# Patient Record
Sex: Female | Born: 1960
Health system: Southern US, Community
[De-identification: ages and names within clinical notes are randomized; demographics above are authoritative.]

## PROBLEM LIST (undated history)

## (undated) ENCOUNTER — Emergency Department (HOSPITAL_COMMUNITY): Admission: EM | Discharge status: Absent without leave | Payer: Self-pay

## (undated) DIAGNOSIS — M1611 Unilateral primary osteoarthritis, right hip: Secondary | ICD-10-CM

## (undated) DIAGNOSIS — K589 Irritable bowel syndrome without diarrhea: Secondary | ICD-10-CM

## (undated) DIAGNOSIS — G20A1 Parkinson's disease without dyskinesia, without mention of fluctuations: Secondary | ICD-10-CM

## (undated) DIAGNOSIS — Z87442 Personal history of urinary calculi: Secondary | ICD-10-CM

## (undated) DIAGNOSIS — R350 Frequency of micturition: Secondary | ICD-10-CM

## (undated) DIAGNOSIS — G20 Parkinson's disease: Secondary | ICD-10-CM

## (undated) DIAGNOSIS — G629 Polyneuropathy, unspecified: Secondary | ICD-10-CM

## (undated) DIAGNOSIS — K219 Gastro-esophageal reflux disease without esophagitis: Secondary | ICD-10-CM

## (undated) DIAGNOSIS — M5136 Other intervertebral disc degeneration, lumbar region: Secondary | ICD-10-CM

## (undated) DIAGNOSIS — F419 Anxiety disorder, unspecified: Secondary | ICD-10-CM

## (undated) DIAGNOSIS — M7750 Other enthesopathy of unspecified foot: Secondary | ICD-10-CM

## (undated) DIAGNOSIS — E7800 Pure hypercholesterolemia, unspecified: Secondary | ICD-10-CM

## (undated) DIAGNOSIS — F209 Schizophrenia, unspecified: Secondary | ICD-10-CM

## (undated) DIAGNOSIS — F32A Depression, unspecified: Secondary | ICD-10-CM

## (undated) DIAGNOSIS — Z9889 Other specified postprocedural states: Secondary | ICD-10-CM

## (undated) DIAGNOSIS — E119 Type 2 diabetes mellitus without complications: Secondary | ICD-10-CM

## (undated) DIAGNOSIS — G8929 Other chronic pain: Secondary | ICD-10-CM

## (undated) DIAGNOSIS — N189 Chronic kidney disease, unspecified: Secondary | ICD-10-CM

## (undated) DIAGNOSIS — F329 Major depressive disorder, single episode, unspecified: Secondary | ICD-10-CM

## (undated) DIAGNOSIS — G709 Myoneural disorder, unspecified: Secondary | ICD-10-CM

## (undated) DIAGNOSIS — M1712 Unilateral primary osteoarthritis, left knee: Secondary | ICD-10-CM

## (undated) DIAGNOSIS — R51 Headache: Secondary | ICD-10-CM

## (undated) DIAGNOSIS — R112 Nausea with vomiting, unspecified: Secondary | ICD-10-CM

## (undated) DIAGNOSIS — J45909 Unspecified asthma, uncomplicated: Secondary | ICD-10-CM

## (undated) DIAGNOSIS — M25372 Other instability, left ankle: Secondary | ICD-10-CM

## (undated) DIAGNOSIS — I10 Essential (primary) hypertension: Secondary | ICD-10-CM

## (undated) HISTORY — DX: Other enthesopathy of unspecified foot and ankle: M77.50

## (undated) HISTORY — DX: Unilateral primary osteoarthritis, left knee: M17.12

## (undated) HISTORY — DX: Essential (primary) hypertension: I10

## (undated) HISTORY — PX: ABDOMINAL HYSTERECTOMY: SHX81

## (undated) HISTORY — DX: Polyneuropathy, unspecified: G62.9

## (undated) HISTORY — DX: Nausea with vomiting, unspecified: R11.2

## (undated) HISTORY — DX: Unilateral primary osteoarthritis, right hip: M16.11

## (undated) HISTORY — DX: Chronic kidney disease, unspecified: N18.9

## (undated) HISTORY — DX: Other instability, left ankle: M25.372

## (undated) HISTORY — DX: Major depressive disorder, single episode, unspecified: F32.9

## (undated) HISTORY — PX: ANKLE SURGERY: SHX546

## (undated) HISTORY — PX: CARDIAC CATHETERIZATION: SHX172

## (undated) HISTORY — DX: Other specified postprocedural states: Z98.890

## (undated) HISTORY — PX: APPENDECTOMY: SHX54

## (undated) HISTORY — DX: Type 2 diabetes mellitus without complications: E11.9

## (undated) HISTORY — DX: Schizophrenia, unspecified: F20.9

## (undated) HISTORY — DX: Other intervertebral disc degeneration, lumbar region: M51.36

## (undated) HISTORY — DX: Headache: R51

## (undated) HISTORY — PX: JOINT REPLACEMENT: SHX530

## (undated) HISTORY — DX: Depression, unspecified: F32.A

## (undated) HISTORY — DX: Irritable bowel syndrome, unspecified: K58.9

---

## 1995-02-09 HISTORY — PX: KNEE ARTHROSCOPY: SHX127

## 2003-01-11 ENCOUNTER — Emergency Department (HOSPITAL_COMMUNITY): Admission: EM | Admit: 2003-01-11 | Discharge: 2003-01-11 | Payer: Self-pay | Admitting: Emergency Medicine

## 2003-09-01 ENCOUNTER — Emergency Department (HOSPITAL_COMMUNITY): Admission: EM | Admit: 2003-09-01 | Discharge: 2003-09-01 | Payer: Self-pay | Admitting: *Deleted

## 2005-05-14 ENCOUNTER — Encounter: Admission: RE | Admit: 2005-05-14 | Discharge: 2005-05-14 | Payer: Self-pay | Admitting: Gastroenterology

## 2005-05-27 ENCOUNTER — Encounter: Admission: RE | Admit: 2005-05-27 | Discharge: 2005-05-27 | Payer: Self-pay | Admitting: Internal Medicine

## 2005-06-14 ENCOUNTER — Encounter: Admission: RE | Admit: 2005-06-14 | Discharge: 2005-06-14 | Payer: Self-pay | Admitting: Gastroenterology

## 2005-06-25 ENCOUNTER — Encounter: Admission: RE | Admit: 2005-06-25 | Discharge: 2005-06-25 | Payer: Self-pay | Admitting: Internal Medicine

## 2006-07-27 ENCOUNTER — Inpatient Hospital Stay (HOSPITAL_COMMUNITY): Admission: EM | Admit: 2006-07-27 | Discharge: 2006-07-30 | Payer: Self-pay | Admitting: Emergency Medicine

## 2006-07-28 ENCOUNTER — Encounter: Payer: Self-pay | Admitting: Interventional Cardiology

## 2006-07-29 ENCOUNTER — Encounter (INDEPENDENT_AMBULATORY_CARE_PROVIDER_SITE_OTHER): Payer: Self-pay | Admitting: Internal Medicine

## 2006-11-10 ENCOUNTER — Ambulatory Visit (HOSPITAL_COMMUNITY): Admission: RE | Admit: 2006-11-10 | Discharge: 2006-11-10 | Payer: Self-pay | Admitting: Orthopedic Surgery

## 2006-11-10 ENCOUNTER — Ambulatory Visit: Payer: Self-pay | Admitting: Vascular Surgery

## 2008-12-12 ENCOUNTER — Encounter
Admission: RE | Admit: 2008-12-12 | Discharge: 2009-01-29 | Payer: Self-pay | Admitting: Physical Medicine & Rehabilitation

## 2008-12-13 ENCOUNTER — Ambulatory Visit: Payer: Self-pay | Admitting: Physical Medicine & Rehabilitation

## 2009-01-13 ENCOUNTER — Ambulatory Visit: Payer: Self-pay | Admitting: Physical Medicine & Rehabilitation

## 2009-02-10 ENCOUNTER — Encounter
Admission: RE | Admit: 2009-02-10 | Discharge: 2009-05-11 | Payer: Self-pay | Admitting: Physical Medicine & Rehabilitation

## 2009-02-11 ENCOUNTER — Ambulatory Visit: Payer: Self-pay | Admitting: Physical Medicine & Rehabilitation

## 2009-04-08 ENCOUNTER — Ambulatory Visit: Payer: Self-pay | Admitting: Physical Medicine & Rehabilitation

## 2009-05-08 ENCOUNTER — Encounter
Admission: RE | Admit: 2009-05-08 | Discharge: 2009-08-06 | Payer: Self-pay | Admitting: Physical Medicine & Rehabilitation

## 2009-05-12 ENCOUNTER — Ambulatory Visit: Payer: Self-pay | Admitting: Physical Medicine & Rehabilitation

## 2009-06-12 ENCOUNTER — Ambulatory Visit: Payer: Self-pay | Admitting: Physical Medicine & Rehabilitation

## 2009-07-05 ENCOUNTER — Emergency Department (HOSPITAL_COMMUNITY): Admission: EM | Admit: 2009-07-05 | Discharge: 2009-07-06 | Payer: Self-pay | Admitting: Emergency Medicine

## 2009-07-11 ENCOUNTER — Ambulatory Visit: Payer: Self-pay | Admitting: Physical Medicine & Rehabilitation

## 2009-08-13 ENCOUNTER — Encounter (INDEPENDENT_AMBULATORY_CARE_PROVIDER_SITE_OTHER): Payer: Self-pay | Admitting: *Deleted

## 2009-08-18 ENCOUNTER — Encounter
Admission: RE | Admit: 2009-08-18 | Discharge: 2009-10-21 | Payer: Self-pay | Admitting: Physical Medicine & Rehabilitation

## 2009-08-26 ENCOUNTER — Ambulatory Visit: Payer: Self-pay | Admitting: Physical Medicine & Rehabilitation

## 2009-09-04 ENCOUNTER — Ambulatory Visit: Payer: Self-pay | Admitting: Sports Medicine

## 2009-09-05 ENCOUNTER — Telehealth: Payer: Self-pay | Admitting: *Deleted

## 2009-09-08 ENCOUNTER — Ambulatory Visit: Payer: Self-pay | Admitting: Family Medicine

## 2009-09-22 ENCOUNTER — Ambulatory Visit: Payer: Self-pay | Admitting: Sports Medicine

## 2009-10-21 ENCOUNTER — Ambulatory Visit: Payer: Self-pay | Admitting: Physical Medicine & Rehabilitation

## 2009-10-27 ENCOUNTER — Ambulatory Visit: Payer: Self-pay | Admitting: Sports Medicine

## 2009-10-28 ENCOUNTER — Ambulatory Visit: Payer: Self-pay | Admitting: Family Medicine

## 2009-12-15 ENCOUNTER — Ambulatory Visit: Payer: Self-pay | Admitting: Sports Medicine

## 2009-12-15 ENCOUNTER — Encounter (INDEPENDENT_AMBULATORY_CARE_PROVIDER_SITE_OTHER): Payer: Self-pay | Admitting: *Deleted

## 2009-12-15 DIAGNOSIS — T887XXA Unspecified adverse effect of drug or medicament, initial encounter: Secondary | ICD-10-CM | POA: Insufficient documentation

## 2009-12-15 DIAGNOSIS — M7750 Other enthesopathy of unspecified foot: Secondary | ICD-10-CM

## 2009-12-15 HISTORY — DX: Other enthesopathy of unspecified foot and ankle: M77.50

## 2009-12-24 ENCOUNTER — Encounter: Payer: Self-pay | Admitting: Sports Medicine

## 2010-01-26 ENCOUNTER — Ambulatory Visit: Payer: Self-pay | Admitting: Sports Medicine

## 2010-02-26 ENCOUNTER — Encounter: Payer: Self-pay | Admitting: Sports Medicine

## 2010-03-01 ENCOUNTER — Encounter: Payer: Self-pay | Admitting: Internal Medicine

## 2010-03-10 NOTE — Letter (Signed)
Summary: Generic Letter  [facility]  [address]   [place], [place] [address]   Phone: [phone]  Fax: [phone]    12/15/2009  Heather Haley [address] [place], [place]  [address]   The above patient is referred back to her primary physician for management of medications.           Sincerely,   Amy Jake Shark RN

## 2010-03-10 NOTE — Assessment & Plan Note (Signed)
Summary: 1:30 APPT,ARCH BAND PAIN,MC   Vital Signs:  Patient profile:   50 year old female Pulse rate:   96 / minute BP sitting:   138 / 91  (right arm)  Vitals Entered By: Terese Door (September 08, 2009 1:33 PM) CC: left arch band pain   CC:  left arch band pain.  History of Present Illness: f/u left foot pain--arch band has made it no better--infact pain seems worse. Pain is mostly lateral below malleolous, aching. Unchanged since last visit.  In review (from last visit) DOI: 04/17/2006 - was vacuuming, backing up missed a step & twisted ankle with inversion injury.  Was able to walk with significant limp after injury.  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 Phineas Real - [facility]) Following initial surgery continued to have pain, underwent second surgery 06/2007 with repair of peroneal tendons.    Patient notes pain in medial & lateral aspects of L ankle.  Pain is constant, worse with walking & activity.  Pain usually 3/10, increasing to 5/10. Associated ankle & foot swelling. Also has decreased ROM.   Some tingling in foot & posterior calf, along with muscle spasms. Last evaluated by Dr. Phineas Real 3-4 months ago & placed in rigid, metal ankle stablizer built into shoe. Ankle very unstable without brace. Was referred to [place] by Dr. Wynn Banker with [facility], has been seeing him for 51-months. Pain somewhat controlled with regimen of Tramadol, Lidoderm Patch. Was previously on Robaxin for muscle spasms. Can only stand on feet 15-20 mins before ankle starts to bother her.  Previously able to be on feet 8-12 hrs/day without problems.    Allergies: 1)  ! Pcn 2)  ! Codeine 3)  ! Nsaids  Past History:  Past Surgical History: Last updated: 09/04/2009 12/2006 - L ankle surgery 06/2007 - L ankle surgery/repair of peroneal  tendons L knee arthroscopy hysterectomy  Review of Systems  The patient denies fever.    Physical Exam  General:  alert, well-developed, well-nourished, well-hydrated, and overweight-appearing.   Msk:  Left ankle / foot: TTP beklow malleolus laterally and medially around naviular bone but is diffuse here,   Additional Exam:   [place]: I reviewed her [place] pictures from lastvisit showing fluid in teh peroneal tendon sheath  Patient given informed consent for injection. Discussed possible complications of infection, bleeding or skin atrophy at site of injection. Possible side effect of avascular necrosis (focal area of bone death) due to steroid use.Appropriate verbal time out taken Are cleaned and prepped in usual sterile fashion. A -1/2--- cc kennalog 40 plus --1/2--cc 1% lidocaine without epinephrine was injected into the-area around the peroneal tendon sheath--. Patient tolerated procedure well with no complications.    Foot/Ankle Exam  Ankle Exam:    Left:    Inspection/Palpation:  approx 7 degrees inversion and eversion. Anterior drawer is stable (mildly painful) TTP over peroneal tendons, no subluxation is noted. soft touch sensation intact skin intact with     Range of Motion:       Dorsiflex-Active: 10       Plantar Flex-Active: 25   Impression & Recommendations:  Problem # 1:  ANKLE PAIN, LEFT (ICD-719.47)  Orders: Joint Aspirate / Injection, Small (10626) Kenalog 10 mg inj (R4854) very complicated past surgical history. In apporpriate brace, does not want o consider any new surgical intervention  at this time. We discussed options and will try single injection around peroneal tendon sheath to see if we can speed healing, decrease her inflammation. rtc 2-4 w

## 2010-03-10 NOTE — Assessment & Plan Note (Signed)
Summary: NP,WC,L ANKLE INJURY,MC   Vital Signs:  Patient profile:   50 year old female Height:      65 inches Weight:      246 pounds BMI:     41.08 BP sitting:   142 / 86  (left arm) Cuff size:   regular  Vitals Entered By: Tessie Fass CMA (September 04, 2009 8:38 AM) CC: F/U left ankle injury Pain Assessment Patient in pain? yes     Location: left ankle Intensity: 3   CC:  F/U left ankle injury.  History of Present Illness: 50yo female to office for L ankle pain. DOI: 04/17/2006 - was vacuuming, backing up missed a step & twisted ankle with inversion injury.  Was able to walk with significant limp after injury.  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 Phineas Real - [facility]) Following initial surgery continued to have pain, underwent second surgery 06/2007 with repair of peroneal tendons.    Patient notes pain in medial & lateral aspects of L ankle.  Pain is constant, worse with walking & activity.  Pain usually 3/10, increasing to 5/10. Associated ankle & foot swelling. Also has decreased ROM.   Some tingling in foot & posterior calf, along with muscle spasms. Last evaluated by Dr. Phineas Real 3-4 months ago & placed in rigid, metal ankle stablizer built into shoe. Ankle very unstable without brace. Was referred to [place] by Dr. Wynn Banker with [facility], has been seeing him for 82-months. Pain somewhat controlled with regimen of Tramadol, Lidoderm Patch. Was previously on Robaxin for muscle spasms. Can only stand on feet 15-20 mins before ankle starts to bother her.  Previously able to be on feet 8-12 hrs/day without problems.  PMH: HTN, GERD, depression PSH: ankle, hysterectomy, L knee arthroscopy All: PCN - upset stomach, codeine- hives, NSAIDs - cause sweating, palpitations  MEDS: Amolodipine 5mg  once daily Omeprazole 20  once daily Tramadol 100mg  three times a day Ambien 10mg  1/2 tab q hs Lidoderm Patch 2 patches daily Citalopram 10mg  30mg  once daily x 1wk, then 20mg  once daily x 1wk, then 10mg  once daily x 1 wk, then stop (started wean 3-days ago) Cymbalta 20mg  1 tab once daily x 14 days, then 2 tabs once daily (started 3-days ago)  Social: non-smoker, no EtOH, no drugs.  Currently on Workers Comp thru [employer]. Fam hx: CHF, colon CA  Allergies (verified): 1)  ! Pcn 2)  ! Codeine 3)  ! 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 [employer]  Review of Systems General:  Denies chills, fatigue, and fever. CV:  Denies swelling of feet and swelling of hands. MS:  Complains of joint pain, joint swelling, loss of strength, muscle aches, cramps, muscle weakness, and stiffness; denies joint redness, low back pain, and mid back pain. Derm:  Denies changes in color of skin, lesion(s), and rash. Neuro:  Complains of numbness, tingling, and weakness; denies tremors. Psych:  Complains of depression. Heme:  Denies abnormal bruising and bleeding.  Physical Exam  General:  alert, well-developed, and overweight-appearing.  NAD  Head:  normocephalic and  atraumatic.   Eyes:  vision grossly intact, pupils equal, and pupils round.   Neck:  full ROM.   Lungs:  normal respiratory effort.   Msk:  ANKLES: - L ankle: visible surgical scar posterior to lateral malleoli, appears well healed.  Mild swelling  noted posterior to lateral malleoli in area of peroneal tendons.  Decreased ROM - dorsiflexion 10 degrees, plantarflexion 20 degrees, inversion 5 degrees, eversion 5 degrees.  (+)TTP over peroneal tendons, 3rd/4th metatarsals, &  insertion of plantar fascia.  (+)pain with Kleiger test, no palpable subluxation of peroneal tendons.  No laxity with anterior drawer or Talar tilt.  Strength +4/5 in all planes.  Unable to perform toe raises.  Calf with no tenderness, Thompson test does produce ankle plantar flexion.  - R ankle: no gross deformity, swelling, tenderness.  Normal ROM & strength.  Able to perform toe raises without difficulty.  No tenderness at insertion of plantar fascia.  FEET: b/l cavus feet.  HIPS: weakness noted in hip adductors & abductors b/l.  Normal ROM without pain.  GAIT: walks with trendelberg gait & favors L leg Pulses:  +2/4 LE b/l Extremities:  no edema Neurologic:  alert & oriented X3 and sensation intact to light touch.   Additional Exam:  MSK U/S: L ankle - peroneal tendons with surrounding fluid consistant with chronic swelling/strain.  Peroneal tendon measured 0.46 (nl= 0.26).  Anatomy & location of peroneal muscles/tendons slightly distorted due to ankle surgery.  Small calcaneal spur noted, but achilles appears normal.  PF is thickened with measurement of 0.55.  Ankle mortise with normal appear talar dome, no ankle effusion.  Images saved.    Impression & Recommendations:  Problem # 1:  ANKLE PAIN, LEFT (ICD-719.47)  - Chronic ankle pain s/p injury 03/2006, s/p surgery x 2 with repair of peroneal tear - Ankle u/s today revealed fluid around peroneal tendons c/w chronic swelling from chronic strain.  Also suspect some chronic nerve irritation to Sural Nerve. - Pt is currently on a good medication regimen.  Hope cymbalta will help with her symptoms. - Cont. to wear rigid metal ankle brace for support. - Given arch straps in office today to help better support feet. - Cont. ankle flexibility exercises & cont. to use exercise bands. - f/u 4-6 weeks for re-evaluation  Orders: Foot Orthosis ( Arch Strap/Heel Cup) [phone]) [place] LIMITED [phone])  Problem # 2:  FOOT PAIN, LEFT (ICD-729.5)  -  Likely related to above ankle problems, but thickened PF noted on U/S with possible plantar fascitis - Give arch straps to wear in shoes - Reviewed stretching exercises to help this, along with other exercises.  Orders: Foot Orthosis ( Arch Strap/Heel Cup) [phone]) [place] LIMITED [phone])  Patient Instructions: 1)  You appear to have some chronic irritation of the tendons in the area of your surgery.  These are the peroneal muscles. 2)  Wear arch straps for additional support. 3)  Continue to use rigid brace you are already using. 4)  You are on a good medication regimen at this time, we hope Cymbalta will help to lessen your symptoms. 5)  Continue to do ankle flexibility exercises & use your exercise bands. 6)  Do hip exercises as shown in office to help with your hip muscle strength. 7)  follow-up with [place] in 4-6 weeks.

## 2010-03-10 NOTE — Assessment & Plan Note (Signed)
Summary: W/C,F/U LEFT ANKLE,MC   Vital Signs:  Patient profile:   50 year old female Pulse rate:   86 / minute BP sitting:   137 / 93  (right arm)  Vitals Entered By: Rochele Pages RN (December 15, 2009 11:16 AM) CC: f/u left ankle 10% improved   CC:  f/u left ankle 10% improved.  History of Present Illness: Patient reports to clinic to f/u on lt ankle pain which she reports is approximately 10% improved.  Pain is the same with activity as it was previously.  She reports 2/10 pain at rest, which is an improvement. New meds for her are cymbalta, lyrica, and NTG patch.  She is currently doing foot exercises with the theraband and wearing an AFO when out of the house.  However, she does not use any support while at home.    Additionaly, over the past 2 months she has been having some symptoms that she does not relate to anything specifically but does coiencide with her Cymbalta dose being increased from 40 to 60mg .  The patient brought in a list today of symptoms she has been having that included: 1. Losing her hair 2. Headaches + Sharp pains on the left temporal region of her head 3. Blurred Vision with associated headaches to the point where is is unable to drive. 4. Tremors in her hands and legs 5. Eye pressure and pain 6. Xerostomia 7. Sleep disturbances with new onset nightmares 8. Mood changes including labile moods and sucidality although she relates she would NEVER put her kinds through that.  Of note she does have a family hx of suicide (Father) 43. Weight gain 10. Swelling of her gums 11. Mid Back pain 12. Increased Left Leg swelling   Allergies: 1)  ! Pcn 2)  ! Codeine 3)  ! Nsaids  Physical Exam  General:  Pt alert and appropriately interactive.  Pt appears cushingnoid with thinning hair.  She appears in no acute respiratory distress. Neck:  No deformities or tenderness noted.  No thyroid nodules.  Possible mild thyromegaly. Msk:  L Ankle mild swelling posterior to  lateral malleolus with nitro patch in place.  No echymosis.  Surgical changes are seen, with scaring laterally.  Wears an AFO.  Full Inversion; Limited plantar- and dorsi-flexion and eversion.    Mild tenderness with Talar tilt test, firm endpoint.   Neg metatarsal squeeze.  No tenderness over talar dome with plantar flexion.   Neg Ant Drawer.   MSK [place]:  Split Peroneus Brevis.  This is visualized below malleolus;  there is a fusiform swollen area under malleolus that normalizes as tendon progresses distally to insert onto base of 5th MT mod fluid at this level no inc in doppler flow though Pulses:  R & L Dorsalis Pedis and Posterior Tib 2+/4 Extremities:  No clubbing or cyanosis.    Impression & Recommendations:  Problem # 1:  ANKLE PAIN, LEFT (ICD-719.47)  Orders: Airsport brace ([id number]) [place] LIMITED ([id number])   cont on NTG patch as this may have helped some w direct pain over this area  see me again in 6 wks  Problem # 2:  OTHER ENTHESOPATHY OF ANKLE AND TARSUS (ICD-726.79)  On today's scan the peroneal tendons look to have a split in peroneus brevis with interposition of peroneus longus into this tear needs more consistent bracing than ASO or AFO and given brace today ice at end of day  Orders: [place] LIMITED ([id number])  Problem # 3:  ADVERSE  DRUG REACTION (ICD-995.20) I am concerned with periorbital swelling more puffiness noted in ankles and legs bilat  note  myriad of sxs that have developed  cymbalta seems to be most likely cause but maybe the combo of this + lyrica  I want her to see Dr Tanya Nones for repeat evaluation and perhaps weaning cymbalta  Complete Medication List: 1)  Amlodipine Besylate 5 Mg Tabs (Amlodipine besylate) .Marland Kitchen.. 1 by mouth qd 2)  Tramadol Hcl 50 Mg Tabs (Tramadol hcl) .... 2 by mouth three times a day as directed 3)  Zolpidem Tartrate 10 Mg Tabs (Zolpidem tartrate) .... 1/2 tab qhs 4)  Cymbalta 20 Mg Cpep (Duloxetine hcl) .... 2 by mouth qd 5)   Nitroglycerin 0.2 Mg/hr Pt24 (Nitroglycerin) .... 1/4 patch daily to left ankle as directed 6)  Lyrica 75 Mg Caps (Pregabalin) .Marland Kitchen.. 1 by mouth at bedtime 7)  Prednisone 20 Mg Tabs (Prednisone) .... 2 tabs by mouth x 5 days  Patient Instructions: 1)  Please follow up with your PCP to address changing your Cymbalta and possible your Lyrica also.  We suspect that Cymbalta is what is causing the majority of your symptoms however would like your PCP to workup your symptoms further.  We are most suspicious of a thyroid problem.     Orders Added: 1)  Airsport brace [L1906] 2)  Est. Patient Level IV [11914] 3)  [place] LIMITED [[id number]]

## 2010-03-10 NOTE — Letter (Signed)
Summary: *Referral Letter  [facility]  [address]   [place], [place] [address]   Phone: [phone]  Fax: [phone]    12/15/2009 Gilmore Laroche, MD [facility]  Dear [name]:  Please reevaluate your patient:  Heather Haley [address] [place], [place]  [address]  Phone: [phone]  Reason for Referral: I am concerned that she is having a significant adverse reaction to medications - possibly cymbalta and/or lyrica.  Another possibility might be hypothyroidism as she has new periorbital edema.  Today I was able to demonstrate a split in her peroneus brevis tendon on left and this is probably why that ankle  has never healed completely.  I will give her more rigid splinting and keep using NTG to see if this might promote tissue healing.  Procedures Requested:   Current Medical Problems: 1)  ADVERSE DRUG REACTION (ICD-995.20) 2)  OTHER ENTHESOPATHY OF ANKLE AND TARSUS (ICD-726.79) 3)  FOOT PAIN, LEFT (ICD-729.5) 4)  ANKLE PAIN, LEFT (ICD-719.47)   Current Medications: 1)  AMLODIPINE BESYLATE 5 MG TABS (AMLODIPINE BESYLATE) 1 by mouth qd 2)  TRAMADOL HCL 50 MG TABS (TRAMADOL HCL) 2 by mouth three times a day as directed 3)  ZOLPIDEM TARTRATE 10 MG TABS (ZOLPIDEM TARTRATE) 1/2 tab qhs 4)  CYMBALTA 20 MG CPEP (DULOXETINE HCL) 2 by mouth qd 5)  NITROGLYCERIN 0.2 MG/HR PT24 (NITROGLYCERIN) 1/4 patch daily to left ankle as directed 6)  LYRICA 75 MG CAPS (PREGABALIN) 1 by mouth at bedtime 7)  PREDNISONE 20 MG TABS (PREDNISONE) 2 tabs by mouth x 5 days - completed  Past Medical History: 1)  HTN 2)  GERD 3)  Depression  Thank you again for agreeing to see our patient; please contact us if you have any further questions or need additional information.  Sincerely,  Vincent Gros MD

## 2010-03-10 NOTE — Letter (Signed)
Summary: [employer]  [employer]   Imported By: Marily Memos 12/25/2009 13:26:58  _____________________________________________________________________  External Attachment:    Type:   Image     Comment:   External Document

## 2010-03-10 NOTE — Assessment & Plan Note (Signed)
Summary: F/U,MC   Vital Signs:  Patient profile:   50 year old female BP sitting:   149 / 92  Vitals Entered By: Lillia Pauls CMA (September 22, 2009 10:00 AM)  History of Present Illness: PCP is DR Tanya Nones  Referrd to me by Dr Wynn Banker  Still not much relief of pain sxs few days of relief p injeciton wore off P about 3 days  meds - no real change w lidoderm now trying cymbalta and up to 40 w no real change  In past tried gapapentin - too many side effects  has good rigid ankle foot brace  does have pp soft orthotics    Allergies: 1)  ! Pcn 2)  ! Codeine 3)  ! Nsaids  Physical Exam  General:  Well-developed,well-nourished,in no acute distress; alert,appropriate and cooperative throughout examination Msk:  left ankle shows persistent swelling behin lat malleolus this is an area of surgical scar no real change from earlier exam   Impression & Recommendations:  Problem # 1:  ANKLE PAIN, LEFT (ICD-719.47) with persistent swelling over peroneal tendons I think a trial of NTG patches is reasonable seh has had 2 surgeries without relief would like to avoid more surgery not much relief w lidoderm either and maybe we can wean  see inst  Problem # 2:  FOOT PAIN, LEFT (ICD-729.5) would cont to use soft orthotic and rigid brace this stops too much ankle wobble and stress to base of 5th MT where she has most of her pain  Complete Medication List: 1)  Amlodipine Besylate 5 Mg Tabs (Amlodipine besylate) .Marland Kitchen.. 1 by mouth qd 2)  Tramadol Hcl 50 Mg Tabs (Tramadol hcl) .... 2 by mouth three times a day as directed 3)  Zolpidem Tartrate 10 Mg Tabs (Zolpidem tartrate) .... 1/2 tab qhs 4)  Cymbalta 20 Mg Cpep (Duloxetine hcl) .... 2 by mouth qd 5)  Nitroglycerin 0.2 Mg/hr Pt24 (Nitroglycerin) .... 1/4 patch daily to left ankle as directed  Patient Instructions: 1)  use 1/4 patch of NTG daily around lateral left ankle 2)  do not cover this with lidoderm 3)  use lidoderm every  other day and see if it makes difference and if not stop 4)  don't change other meds 5)  I want to recheck this in 1 month after starting patches Prescriptions: NITROGLYCERIN 0.2 MG/HR PT24 (NITROGLYCERIN) 1/4 patch daily to left ankle as directed  #1 box x 2   Entered and Authorized by:   Enid Baas MD   Signed by:   Enid Baas MD on 09/22/2009   Method used:   Print then Give to Patient   RxID:   [id number]

## 2010-03-10 NOTE — Assessment & Plan Note (Signed)
Summary: F/U,MC   Vital Signs:  Patient profile:   50 year old female BP sitting:   138 / 84  Vitals Entered By: Lillia Pauls CMA (October 27, 2009 10:37 AM)  History of Present Illness: L ankle pain follow up  PainNot improved with the Nitroglycerine Patch.     - does get a warm feeilng laterally and extending up leg, but no pain relief now does have some improved sensation along left lat leg   - pain worse as the day goes on    - no inuries or trauma recently   - Doing theraband exercises approx 3 days a week.   wants to discuss Medications:  Taking cymbalta daily, Tramadol, nitro patch.  Tried Lidocain patch in the past (no relief).   Allergies: 1)  ! Pcn 2)  ! Codeine 3)  ! Nsaids  Physical Exam  General:  Well-developed,well-nourished,in no acute distress; alert,appropriate and cooperative throughout examination.  Obese Msk:  L ankle with no swelling or echymosis.  surgical changes are seen, with scaring laterally.  Wears a rigid metal brace, preventing inversion / eversion.   Limited dorsiflexion / plantar flexion ROM.   Limited Eversion of foot.  Inversion full.   Mild tenderness with Talar tilt test, firm endpoint.   Neg metatarsal squeeze.  No tenderness over talar dome with plantar flexion.   Reverse Talar tilt Non-tender.   Neg Ant Drawer.    Pulses:  intact pulses in DP of L foot.    Foot/Ankle Exam  Vascular:    dorsalis pedis and posterior tibial pulses 2+ and symmetric, capillary refill < 2 seconds, normal hair pattern, no evidence of ischemia.    Impression & Recommendations:  Problem # 1:  ANKLE PAIN, LEFT (ICD-719.47) Assessment Unchanged 50 yo f here with Chronic L ankle pain, dating back to inversion injury in 2008, s/p surg x 2 to repair peroneal tendon's.  Currently using Cymbalta, Tramadol 100mg  three times a day.   Since last visit (3 weeks) has been using Nitro patch with no resolution of her pain, but has had some increased feeling of  warmth and return of sensation laterally.    - At this point, feel that she is on a good medical regimen to optimize healing.  Will continue and suggest return in 2 months for follow up.    - continue ROM and theraband exercise ck [place] in 2 mos   - patient interested in decreasing Tramadol dose, informed her to take only as needed, not scheduled.    - RTC to me for any instances of Intense pain, could try a therapeutic Injection as a work in.   Complete Medication List: 1)  Amlodipine Besylate 5 Mg Tabs (Amlodipine besylate) .Marland Kitchen.. 1 by mouth qd 2)  Tramadol Hcl 50 Mg Tabs (Tramadol hcl) .... 2 by mouth three times a day as directed 3)  Zolpidem Tartrate 10 Mg Tabs (Zolpidem tartrate) .... 1/2 tab qhs 4)  Cymbalta 20 Mg Cpep (Duloxetine hcl) .... 2 by mouth qd 5)  Nitroglycerin 0.2 Mg/hr Pt24 (Nitroglycerin) .... 1/4 patch daily to left ankle as directed

## 2010-03-10 NOTE — Letter (Signed)
Summary: Out of Work  [facility]  [address]   [place], [place] [address]   Phone: [phone]  Fax: [phone]    August 13, 2009   Employee:  Briscoe Burns    To Whom It May Concern:   For Medical reasons, please excuse the above named employee from work for the following dates:  Start:  August 13, 2009 9:03 AM   End:  August 13, 2009 9:03 AM   If you need additional information, please feel free to contact our office.         Sincerely,    Lillia Pauls CMA

## 2010-03-10 NOTE — Progress Notes (Signed)
----   Converted from flag ---- ---- 09/05/2009 10:48 AM, Marily Memos wrote: Pt states that the arch band that she got yesterday is painful, its helping a little bit she wants to know if she should continue to use it.  Pt contact # [phone]. ------------------------------  advised pt that she should use the arch band as much as possible but if she is still in pain and it is not any better by monday to call and schedule a f/u appt.

## 2010-03-10 NOTE — Assessment & Plan Note (Signed)
Summary: 1:45appt,ankle/foot pain,mc   Vital Signs:  Patient profile:   50 year old female Pulse rate:   103 / minute BP sitting:   157 / 97  (right arm)  Vitals Entered By: Lillia Pauls CMA (October 28, 2009 1:38 PM) CC: lt foot and ankle pain since yesterday   History of Present Illness: 50 yo f here with Chronic L ankle pain, dating back to inversion injury in 2008, s/p surg x 2 to repair peroneal tendon's.  Currently using Cymbalta, Tramadol 100mg  three times a day.   Since last visit (3 weeks) has been using Nitro patch with no resolution of her pain  Patient here for follow up from visit with [facility] yesterday.  See note from yesterday for full details and history.   L ankle - having an acute amount of wor[place] pain today,  started after the exam yesterday with Dr Darrick Penna.  - swelling and pain all round lateral ankle, extending accross forefoot into medial malleolous.   - some sharp shooting pains on lateral foot as well  - has take 6 Tramadol today, and 4 tylenol, with no relief  - unable to sleep last night due to pain.     - It hurts so bad, she Wishes" that her foot could be cut off and have a broom handle in its place"    - Has tried Voltaren Gel in the past, and had similar reaction to what happens when she takes an NSAID.   Allergies: 1)  ! Pcn 2)  ! Codeine 3)  ! Nsaids  Past History:  Past medical, surgical, family and social histories (including risk factors) reviewed, and no changes noted (except as noted below).  Past Medical History: Reviewed history from 09/04/2009 and no changes required. HTN GERD Depression  Past Surgical History: Reviewed history from 09/04/2009 and no changes required. 12/2006 - L ankle surgery 06/2007 - L ankle surgery/repair of peroneal tendons L knee arthroscopy hysterectomy  Family History: Reviewed history from 09/04/2009 and no changes required. Colon CA CHF  Social History: Reviewed history from 09/04/2009 and no  changes required. 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 [employer]  Physical Exam  General:  Well-developed,well-nourished,in no acute distress; alert,appropriate and cooperative throughout examination Msk:  R ankle:  No echymosis or erythema.  Moderate amount of swelling is noted laterally, increased from yesterday.  acutely tender to palpation both medial and lateral malleolous, and laterally extending up the peroneal tendon.    No Ligamentous testing done today due to patient's pain.     Impression & Recommendations:  Problem # 1:  ANKLE PAIN, LEFT (ICD-719.47) Assessment Deteriorated Deterioration of patient's pain from yesterday's exam.  Will attempt to treat patient with systemic steroids today.  Prednisone 40mg  burst X 5 days.  In addiation, will add lyrica nightly for neuropathic pain.    - follow up as needed with Dr Darrick Penna.   Complete Medication List: 1)  Amlodipine Besylate 5 Mg Tabs (Amlodipine besylate) .Marland Kitchen.. 1 by mouth qd 2)  Tramadol Hcl 50 Mg Tabs (Tramadol hcl) .... 2 by mouth three times a day as directed 3)  Zolpidem Tartrate 10 Mg Tabs (Zolpidem tartrate) .... 1/2 tab qhs 4)  Cymbalta 20 Mg Cpep (Duloxetine hcl) .... 2 by mouth qd 5)  Nitroglycerin 0.2 Mg/hr Pt24 (Nitroglycerin) .... 1/4 patch daily to left ankle as directed 6)  Lyrica 75 Mg Caps (Pregabalin) .Marland Kitchen.. 1 by mouth at bedtime 7)  Prednisone 20  Mg Tabs (Prednisone) .... 2 tabs by mouth x 5 days Prescriptions: PREDNISONE 20 MG TABS (PREDNISONE) 2 tabs by mouth x 5 days  #10 x 0   Entered and Authorized by:   Hannah Beat MD   Signed by:   Hannah Beat MD on 10/28/2009   Method used:   Print then Give to Patient   RxID:   [id number] LYRICA 75 MG CAPS (PREGABALIN) 1 by mouth at bedtime  #30 x 3   Entered and Authorized by:   Hannah Beat MD   Signed by:   Hannah Beat MD on 10/28/2009   Method used:   Print then Give to Patient   RxID:    [id number]

## 2010-03-12 NOTE — Assessment & Plan Note (Signed)
Summary: W/C F/U,MC   Vital Signs:  Patient profile:   50 year old female Pulse rate:   91 / minute BP sitting:   149 / 106  (right arm)  Vitals Entered By: Rochele Pages RN (January 26, 2010 11:19 AM) CC: f/u lt ankle 20 % improved   CC:  f/u lt ankle 20 % improved.  History of Present Illness: Pt here for followup on L ankle injury. Pt reports some improvement in L ankle pain since last clinic visit. Has tolerated hard and air ankle brace well. Feels that this has help support her ankle better. Ultram has helped with pain per pt. has been using approx 300mg  daily and feels that this has able to "take the edge off of the pain". Still doing theraband exercises per pt. Pt only took NTG patch x 1 1/2 wk as WC refused to pay for medication per pt.   Still has previous symptoms as previously reported including: 1. Losing her hair 2. Headaches + Sharp pains on the left temporal region of her head 3. Blurred Vision with associated headaches to the point where is is unable to drive. 4. Tremors in her hands and legs 5. Eye pressure and pain 6. Xerostomia 7. Sleep disturbances with new onset nightmares 8. Mood changes including labile moods and sucidality although she relates she would NEVER put her kinds through that.  Of note she does have a family hx of suicide (Father) 52. Weight gain 10. Swelling of her gums 11. Mid Back pain 12. Increased Left Leg swelling....  However, pt feels that she has been having worsening HAs, chest tightness, worsening confusion vs previous visit. No CP. Pt has not followed with PCP as previously instructed because WC recommended her followup with an internist. WC now ok with going to PCP. Has appt today.   Allergies: 1)  ! Pcn 2)  ! Codeine 3)  ! Nsaids  Physical Exam  General:  alert and overweight-appearing.   Head:  butterfly rash pver cheeks  Eyes:  + periorbital swelling bilaterally  Neck:  large neck girth Msk:  L Ankle mild swelling posterior  to lateral malleolus .  No echymosis.  incisional scar noted lateral to lateral malleolus Air brace present   Limited plantar- and dorsi-flexion, inversion  and eversion.    Mild tenderness with Talar tilt test, firm endpoint.   Neg metatarsal squeeze.  No tenderness over talar dome with plantar flexion.     MSK [place]:  Split Peroneus Brevis tednon. improved tendon width.  This is now down to 0.90 vs 1.03; note tendon is normal above malleolus and distal to the inf malleolar edge all the way to the base of 5th MT there is a thickened split tendon directly under malleolus with PLongus interposed between strands of peroneus brevis   Impression & Recommendations:  Problem # 1:  ANKLE PAIN, LEFT (ICD-719.47) Assessment Improved  Recommneding continuing with NTG as noted some improvement in pain and tendon swelling s/p NTG. Will continue with gel firm ankle brace along with AFO device; she feels this is totally controlling roll of ankle and cuts down on pain she has had 2 surgeries so I think NTG is best conservative option as it has potential to promote healing to tendon tear  . Will followup in 2 months.   Orders: [place] LIMITED ([id number])  Problem # 2:  OTHER ENTHESOPATHY OF ANKLE AND TARSUS (ICD-726.79) Assessment: Improved  Peroneus tendon still split on exam. Will continue with air brace.  Recommend continuing NTG.   limited rehab is all we can do w tendon split  ice for swelling   Orders: [place] LIMITED ([id number])  Orders: [place] LIMITED ([id number])  Problem # 3:  ADVERSE DRUG REACTION (ICD-995.20) Assessment: Unchanged Pt tentatively scheduled for followup with PCP today as pt likely needs titration of medications. Pt may also need further workup including ANA/RF to r/o autoimmune/collagen vascular diseases as source of sxs. Detailed instructions were given to pt to relay our thoughts about workup to Dr. Tanya Nones. Will also cc Dr. Tanya Nones clinic note.   I would be concerned about Drug induced  SLE or de novo SLE/ thyroid or other endocrine condition  Complete Medication List: 1)  Amlodipine Besylate 5 Mg Tabs (Amlodipine besylate) .Marland Kitchen.. 1 by mouth qd 2)  Tramadol Hcl 50 Mg Tabs (Tramadol hcl) .... 2 by mouth three times a day as directed 3)  Zolpidem Tartrate 10 Mg Tabs (Zolpidem tartrate) .... 1/2 tab qhs 4)  Cymbalta 20 Mg Cpep (Duloxetine hcl) .... 2 by mouth qd 5)  Nitroglycerin 0.2 Mg/hr Pt24 (Nitroglycerin) .... 1/4 patch daily to left ankle as directed 6)  Lyrica 75 Mg Caps (Pregabalin) .Marland Kitchen.. 1 by mouth at bedtime 7)  Prednisone 20 Mg Tabs (Prednisone) .... 2 tabs by mouth x 5 days  Patient Instructions: 1)  Continue with the air and hard brace 2)  There is still a tear in the peroneus tendon on ultrasound and you should continue with the nitroglycerin patch even though worker's complensation is currently not approving it 3)  Please follow up with Dr. Tanya Nones about your medications and make sure to take your symptom card with you 4)  Given your facial rash (malar), eye swelling and other symptoms, it may be of benefit to be screened for lupus and other connective tissue/autoimmune symptoms. 5)  Followup with [place] in 2 months 6)  Otherwise, call with any questions 7)  Merry Christmas  8)  Enid Baas MD    Orders Added: 1)  Est. Patient Level IV [13086] 2)  [place] LIMITED [[id number]]

## 2010-03-18 NOTE — Letter (Signed)
Summary: [employer]  [employer]   Imported By: Marily Memos 03/10/2010 13:49:51  _____________________________________________________________________  External Attachment:    Type:   Image     Comment:   External Document

## 2010-03-30 ENCOUNTER — Encounter (INDEPENDENT_AMBULATORY_CARE_PROVIDER_SITE_OTHER): Payer: Worker's Compensation | Admitting: Sports Medicine

## 2010-03-30 ENCOUNTER — Encounter: Payer: Self-pay | Admitting: Sports Medicine

## 2010-03-30 DIAGNOSIS — M7750 Other enthesopathy of unspecified foot: Secondary | ICD-10-CM

## 2010-03-30 DIAGNOSIS — M25579 Pain in unspecified ankle and joints of unspecified foot: Secondary | ICD-10-CM

## 2010-03-31 ENCOUNTER — Telehealth: Payer: Self-pay | Admitting: Sports Medicine

## 2010-04-07 NOTE — Assessment & Plan Note (Signed)
Summary: WC FU LEFT ANKLE, KNEE/MC/MJD   Vital Signs:  Patient profile:   50 year old female BP sitting:   157 / 81  Vitals Entered By: Lillia Pauls CMA (March 30, 2010 12:03 PM)  History of Present Illness: Patient is about same as two mo ago gets relief w tramadol if pain 4/10 or less Dr Tanya Nones gave some vicodin for days when pain is worse extra ankle support has helped along with AFO brace actually hurts worse on warm days  Did not get a chance to try NTG as was not approved wants to try this today now has approval to use per [employer]  Allergies: 1)  ! Pcn 2)  ! Codeine 3)  ! Nsaids  Physical Exam  General:  Well-developed,well-nourished,in no acute distress; alert,appropriate and cooperative throughout examination  less depressed appearing Msk:  left ankle shows stable ligaments still w pain on inversion there is much less swelling behind lateral malleolus no foot pain noted med malleolus non tender walks w less limp and dec pain Additional Exam:  MSK [place] still has widiening of Peroneal tendons at 1.03 cms behind and below left lat malleolus split in per brevis is unchanged i area w fluid around tendons in retro malleolar space at inf border of lat malleolus remainder of peroneal sheath shows less swelling per brevis is followed to base of 5th and is intact past  this area of tear   Impression & Recommendations:  Problem # 1:  ANKLE PAIN, LEFT (ICD-719.47)  this is better than 6 mos ago but no change in last 2 mos would like to see if NTG affects pain level  Dr Gailen Shelter has adjusted pain meds and they are helping  Orders: [place] LIMITED ([id number])  cont using ankle brace w AFO  Problem # 2:  OTHER ENTHESOPATHY OF ANKLE AND TARSUS (ICD-726.79)  clear split in peroneus brevis she has already had difficult time recovering f surgery on this ankle I think we need to give good trial of conservative care w NTG for minimum of 3 but probably 6 mos  reck by me in 1  mo p starting NTG  cc DR Tanya Nones  Orders: [place] LIMITED ([id number])  Complete Medication List: 1)  Amlodipine Besylate 5 Mg Tabs (Amlodipine besylate) .Marland Kitchen.. 1 by mouth qd 2)  Tramadol Hcl 50 Mg Tabs (Tramadol hcl) .... 2 by mouth three times a day as directed 3)  Zolpidem Tartrate 10 Mg Tabs (Zolpidem tartrate) .... 1/2 tab qhs 4)  Nitroglycerin 0.2 Mg/hr Pt24 (Nitroglycerin) .... Use 1/4 patch daily for 24 hours as directed (workman' comp) 5)  Celexa 40 Mg Tabs (Citalopram hydrobromide) .Marland Kitchen.. 1 by mouth qd 6)  Vicodin 5-500 Mg Tabs (Hydrocodone-acetaminophen) .Marland Kitchen.. 1 by mouth q6hrs pain Prescriptions: NITROGLYCERIN 0.2 MG/HR PT24 (NITROGLYCERIN) use 1/4 patch daily for 24 hours as directed (workman' comp)  #30 x 2   Entered by:   Ellin Mayhew MD   Authorized by:   Enid Baas MD   Signed by:   Ellin Mayhew MD on 03/30/2010   Method used:   Electronically to        [facility] [phone]* (retail)       [address]       [place], [place]  [address]       Ph: [phone]       Fax: [phone]   RxID:   [id number]    Orders Added: 1)  Est. Patient Level III [36644] 2)  [place] LIMITED [  [id number]] 3)  Est. Patient Level III [21308]

## 2010-04-07 NOTE — Progress Notes (Signed)
  Called [facility]ourney- pt was having problems getting rx filled, but it had already been taken care of.  She did not have questions about nitro use.   ---- Converted from flag ---- ---- 03/31/2010 2:57 PM, Enid Baas MD wrote: explain to attorney that this is for biologic healing of tendon tears - not cardiac issues.  google Paolini, Nitroglycerine for tendinopathy  ---- 03/31/2010 11:47 AM, Lillia Pauls CMA wrote: ---- 03/31/2010 11:36 AM, Marily Memos wrote: Tomasa Hosteller Pt attorney would like you to call her at [phone]. regarding questions on why she was prescribed nitro patch. ------------------------------

## 2010-04-21 ENCOUNTER — Encounter: Payer: Self-pay | Admitting: *Deleted

## 2010-04-27 ENCOUNTER — Encounter (INDEPENDENT_AMBULATORY_CARE_PROVIDER_SITE_OTHER): Payer: Worker's Compensation | Admitting: Sports Medicine

## 2010-04-27 ENCOUNTER — Encounter: Payer: Self-pay | Admitting: Sports Medicine

## 2010-04-27 DIAGNOSIS — M7750 Other enthesopathy of unspecified foot: Secondary | ICD-10-CM

## 2010-04-27 DIAGNOSIS — M25579 Pain in unspecified ankle and joints of unspecified foot: Secondary | ICD-10-CM

## 2010-04-27 LAB — URINALYSIS, ROUTINE W REFLEX MICROSCOPIC
Bilirubin Urine: NEGATIVE
Glucose, UA: NEGATIVE mg/dL
Hgb urine dipstick: NEGATIVE
Ketones, ur: NEGATIVE mg/dL
Nitrite: NEGATIVE
Protein, ur: NEGATIVE mg/dL
Specific Gravity, Urine: 1.006 (ref 1.005–1.030)
Urobilinogen, UA: 0.2 mg/dL (ref 0.0–1.0)
pH: 6 (ref 5.0–8.0)

## 2010-04-27 LAB — URINE CULTURE
Colony Count: NO GROWTH
Culture: NO GROWTH

## 2010-04-27 LAB — COMPREHENSIVE METABOLIC PANEL
ALT: 35 U/L (ref 0–35)
AST: 27 U/L (ref 0–37)
Albumin: 4 g/dL (ref 3.5–5.2)
Alkaline Phosphatase: 82 U/L (ref 39–117)
BUN: 11 mg/dL (ref 6–23)
CO2: 27 mEq/L (ref 19–32)
Calcium: 8.7 mg/dL (ref 8.4–10.5)
Chloride: 104 mEq/L (ref 96–112)
Creatinine, Ser: 0.83 mg/dL (ref 0.4–1.2)
GFR calc Af Amer: >60 mL/min (ref >60)
GFR calc non Af Amer: >60 mL/min (ref >60)
Glucose, Bld: 92 mg/dL (ref 70–99)
Potassium: 3.8 mEq/L (ref 3.5–5.1)
Sodium: 138 mEq/L (ref 135–145)
Total Bilirubin: 0.8 mg/dL (ref 0.3–1.2)
Total Protein: 7.7 g/dL (ref 6.0–8.3)

## 2010-04-27 LAB — DIFFERENTIAL
Basophils Absolute: 0 10*3/uL (ref 0.0–0.1)
Basophils Relative: 0 % (ref 0–1)
Eosinophils Absolute: 0.1 10*3/uL (ref 0.0–0.7)
Eosinophils Relative: 2 % (ref 0–5)
Lymphocytes Relative: 27 % (ref 12–46)
Lymphs Abs: 2.1 10*3/uL (ref 0.7–4.0)
Monocytes Absolute: 0.3 10*3/uL (ref 0.1–1.0)
Monocytes Relative: 4 % (ref 3–12)
Neutro Abs: 5.2 10*3/uL (ref 1.7–7.7)
Neutrophils Relative %: 67 % (ref 43–77)

## 2010-04-27 LAB — URINE MICROSCOPIC-ADD ON

## 2010-04-27 LAB — CBC
HCT: 42.4 % (ref 36.0–46.0)
Hemoglobin: 14.1 g/dL (ref 12.0–15.0)
MCHC: 33.3 g/dL (ref 30.0–36.0)
MCV: 88.8 fL (ref 78.0–100.0)
Platelets: 260 10*3/uL (ref 150–400)
RBC: 4.78 MIL/uL (ref 3.87–5.11)
RDW: 13.2 % (ref 11.5–15.5)
WBC: 7.8 10*3/uL (ref 4.0–10.5)

## 2010-04-27 LAB — LIPASE, BLOOD: Lipase: 27 U/L (ref 11–59)

## 2010-05-07 NOTE — Assessment & Plan Note (Signed)
Summary: WC FOLLOW UP   Vital Signs:  Patient profile:   50 year old female BP sitting:   142 / 93  Vitals Entered By: Lillia Pauls CMA (April 27, 2010 10:56 AM)  History of Present Illness: Pt presents for follow up of lt ankle/ knee pain.   She has not seen improvement.  Using NTG since last visit, no change in pain level.   No side effects with NTG.    left knee pain is very intermittent and is not really a clinical problem at this time. She does not feel this gives out, locks or swells.   left ankle pain while not worse feels a little less supported because her brace is starting to stretch out. this causes her to get more pressure not only on the outside of her left ankle but also under the right arch and plantar fascia area.  Allergies: 1)  ! Pcn 2)  ! Codeine 3)  ! Nsaids  Physical Exam  General:  Well-developed,well-nourished,in no acute distress; alert,appropriate and cooperative throughout examination Msk:  Lt ankle still has puffiness behind lateral malleolus to peroneal sheath Lt AT feels ok Large surgical scar fromo 3 in above lat malleolus to 1 in below no swelling medial side  Lt knee lacks 3-4 degrees full extension 120 degrees of flexion Good rotation lt knee ligaments are stable slight puffiness over lt pes ancerine    Impression & Recommendations:  Problem # 1:  ANKLE PAIN, LEFT (ICD-719.47)  not much change in her pain level today. For this reason I would replace her left ankle support as it may have stretched upwards not providing enough stabilization.  Problem # 2:  OTHER ENTHESOPATHY OF ANKLE AND TARSUS (ICD-726.79)  I would favor continuing the nitroglycerin for the next 2 months just to see if we can get any healing of her her peroneal tendons.   Use brace and padding as before.    recheck in 2 months no change in medications suggested today.  cc Dr Tanya Nones  Complete Medication List: 1)  Tramadol Hcl 50 Mg Tabs (Tramadol hcl) .... 2 by  mouth three times a day as directed 2)  Zolpidem Tartrate 10 Mg Tabs (Zolpidem tartrate) .... 1/2 tab qhs 3)  Nitroglycerin 0.2 Mg/hr Pt24 (Nitroglycerin) .... Use 1/4 patch daily for 24 hours as directed (workman' comp) 4)  Celexa 40 Mg Tabs (Citalopram hydrobromide) .Marland Kitchen.. 1 by mouth qd 5)  Vicodin 5-500 Mg Tabs (Hydrocodone-acetaminophen) .Marland Kitchen.. 1 by mouth q6hrs pain 6)  Inderal La 80 Mg Xr24h-cap (Propranolol hcl) .... Take 1 by mouth once daily  Patient Instructions: 1)  Continue nitroglycerin patch in affected area, and return for follow up ultrasound in 2  months.   Orders Added: 1)  Est. Patient Level III [29562]  Appended Document: WC FOLLOW UP Pt picked up airsport brace 04/28/10. Rochele Pages RN  April 28, 2010 2:44 PM

## 2010-05-28 ENCOUNTER — Ambulatory Visit: Payer: Worker's Compensation | Admitting: Sports Medicine

## 2010-06-23 NOTE — H&P (Signed)
NAMEMARGUARITE, Haley NO.:  [id number]   MEDICAL RECORD NO.:  [id number]          PATIENT TYPE:  EMS   LOCATION:  ED                           FACILITY:  [facility]   PHYSICIAN:  Hollice Espy, M.D.DATE OF BIRTH:  [birth year]   DATE OF ADMISSION:  07/27/2006  DATE OF DISCHARGE:                              HISTORY & PHYSICAL   PRIMARY CARE PHYSICIAN:  Lavonda Jumbo, M.D.   CHIEF COMPLAINT:  Chest pain.   HISTORY OF PRESENT ILLNESS:  The patient is a 50 year old white female  with past medical history of obesity who presents to the emergency room  after several days of chest pain.  She previously has been well with no  complaints.  Yesterday she started having episodes of intermittent chest  pressure midsternal lasting about 30 minutes in nature.  She had  associated diaphoresis and nausea.  She was concerned but did not come  into the emergency room.  These were less so in nature today.  At the  time she was doing no activity.  Today she had a severe episode again  while not doing any activity, but this was much more closer to a 10/10  radiating to the left breast, associated with shortness of breath on top  of her diaphoresis and nausea.  She became quite concerned and came to  the emergency room for further evaluation.  In the emergency room, lab  work including cardiac markers was unremarkable.  Her D-dimer was  slightly elevated at 0.58.  EKG showed normal sinus rhythm.  Chest x-ray  was unremarkable.  The patient's chest pain was relieved without any  intervention.  Currently she states that she feels some residual  soreness.  She denies any headaches, vision changes, dysphagia, chest  pain, palpitations, shortness of breath, wheeze, cough, abdominal pain,  hematuria, dysuria, constipation, diarrhea, focal extremity numbness,  weakness, or pain.   REVIEW OF SYSTEMS:  Otherwise negative.   PAST MEDICAL HISTORY:  Borderline hyperlipidemia.   MEDICATIONS:   None.   ALLERGIES:  PENICILLIN and CODEINE.   SOCIAL HISTORY:  She denies any tobacco, alcohol, or drug use.   FAMILY HISTORY:  Father had an enlarged heart.   PHYSICAL EXAMINATION:  VITAL SIGNS:  Temperature 97.6, heart rate 83,  blood pressure 145/87, respirations 20, O2 saturation 97% on room air.  GENERAL:  The patient is alert and oriented x3 in no acute distress.  HEENT:  Normocephalic and atraumatic.  Mucous membranes are moist.  She  has no carotid bruits.  HEART:  Regular rate and rhythm, S1 and S2.  LUNGS:  Clear to auscultation bilaterally.  ABDOMEN:  Soft, nontender, obese.  Positive bowel sounds.  EXTREMITIES:  No cyanosis, clubbing, or edema.   LABORATORY DATA:  EKG and chest x-ray are as per HPI.  CT angio of the  chest has been ordered and is pending.  White count 8.9, H&H 14.7 and  43.5, MCV 85, platelet count 310 with no shift. Sodium 136, potassium  3.9, chloride 105, bicarb 25, BUN 16, creatinine 0.8, glucose 142.  The  rest of the patient's labs  are unremarkable.  CPK 38.7, MB less than 1,  troponin I less than 0.05, D-dimer 0.59.   ASSESSMENT:  1. Chest pain.  The patient does have a few risk factors including      morbid obesity and possible hyperlipidemia.  We will check two      serial sets of cardiac enzymes.  [facility] to do an      Adenosine Cardiolite stress test in the morning.  The patient is      unable to run because of a torn Achilles tendon.  2. Morbid obesity.  3. Elevated D-dimer.   PLAN:  Get CT of the chest to rule out PE.      Hollice Espy, M.D.  Electronically Signed     SKK/MEDQ  D:  07/27/2006  T:  07/27/2006  Job:  [id number]   cc:   Lavonda Jumbo, M.D.  Fax: [phone]   Lyn Records, M.D.  Fax: [phone]

## 2010-06-23 NOTE — Cardiovascular Report (Signed)
NAMEJONICE, CERRA NO.:  [id number]   MEDICAL RECORD NO.:  [id number]          PATIENT TYPE:  INP   LOCATION:  4736                         FACILITY:  [facility]   PHYSICIAN:  Lyn Records, M.D.   DATE OF BIRTH:  Jan 03, 1961   DATE OF PROCEDURE:  07/29/2006  DATE OF DISCHARGE:                            CARDIAC CATHETERIZATION   INDICATIONS:  The patient was admitted with recurring episodes of chest  pressure.  There is no evidence of myocardial injury.  A Cardiolite  study demonstrated probable apical ischemia.  She continues to have  recurring episodes of discomfort responsive to nitroglycerin.   PROCEDURES PERFORMED:  1. Left heart cath.  2. Selective coronary angio.  3. Left ventriculography.  4. Angio-Seal.   DATE OF PROCEDURE:  July 29, 2006.   DESCRIPTION:  The patient was given 2 mg of IV Versed.  Xylocaine 1% was  used for local anesthesia.  A 6-French sheath was placed using the  modified Seldinger technique.  A 6-French A2 multipurpose catheter was  then used for hemodynamic recordings, coronary angiography, hemodynamic  recordings, and left ventriculography was also performed with this  catheter.  Angio-Seal was used after demonstration of appropriate  anatomy with good hemostasis.   RESULTS:  1. Hemodynamic data:      a.     Aortic pressure 108/68.      b.     Left ventricular pressure 108/8-mmHg.  2. Left ventriculography:  Left ventricular contractility is vigorous.      The EF is greater than 75%.  There is a possibility of mid cavity      obstruction, although there was ectopy during the ventriculogram,      perhaps causing hypercontractility post PVC.  No mitral      regurgitation was noted.  3. Coronary angiography.      a.     Left main coronary:  Widely patent.      b.     Left anterior descending coronary:  Widely patent and       transapical.  Large first diagonal arises from the vessel.  The       LAD contains moderate luminal  irregularities throughout the       proximal and mid vessel.  Up to 50% stenosis is noted just beyond       the first diagonal in the LAD.  No significant obstructions are       seen in the LAD or its branches.  C:  Circumflex artery:  Normal.  D.  Right coronary artery:  The right coronary is a dominant vessel, mid  vessel luminal irregularities are noted less than 20%.  A large PDA and  left ventricular branches are noted and are normal.   CONCLUSION:  1. Luminal irregularities with less than or equal to 50% stenosis in      the proximal left anterior descending artery beyond the first      diagonal.  Luminal irregularities also noted in the right coronary.      No significant obstructive disease is noted to be present on this  study.  2. Normal left ventricular function and possible mid cavity      obstruction.  The finding is compounded by the presence of ectopy      during left ventriculography.  3. No obvious explanation for the patient's chest discomfort.  I feel      that the Cardiolite study was false positive.  Chest discomfort is      likely noncardiac in origin.   PLAN:  1. No further cardiac evaluation other than consideration of an      echocardiogram to rule out obstructive hypertrophic cardiomyopathy.  2. Further evaluation for chest discomfort should potentially include      GI evaluation.   The patient is eligible for discharge if her groin remains stable 6  hours post procedure.      Lyn Records, M.D.  Electronically Signed     HWS/MEDQ  D:  07/29/2006  T:  07/29/2006  Job:  [id number]   cc:   Lavonda Jumbo, M.D.

## 2010-06-23 NOTE — Consult Note (Signed)
Heather Haley, SHRODE NO.:  [id number]   MEDICAL RECORD NO.:  [id number]          PATIENT TYPE:  INP   LOCATION:  1435                         FACILITY:  [facility]   PHYSICIAN:  Lyn Records, M.D.   DATE OF BIRTH:  12-23-1960   DATE OF CONSULTATION:  07/28/2006  DATE OF DISCHARGE:                                 CONSULTATION   REASON FOR EVALUATION:  Chest discomfort.   CONCLUSIONS:  1. Atypical chest discomfort.  2. Obesity.  3. Hyperlipidemia.  4. Family history of heart disease.   RECOMMENDATIONS:  1. Adenosine Cardiolite as a reasonable way to exclude significant      evidence of ischemia that may pin down coronary artery disease as      the etiology of the admitting symptoms.  2. Encouraged weight loss.  3. Aggressive risk factor modification given the family history.   COMMENTS:  The patient is 35, works at [employer] and was admitted to  the hospital on July 27, 2006, after developing a 5 to 10-minute episode  of severe crushing substernal discomfort with associated nausea,  dyspnea, and diaphoresis.  There was no radiation of the discomfort.  Prior to this particular episode, she had several similar episodes  lasting up to 1-2 minutes with resolution.  There is no exertional  component.  She is currently pain free.   ALLERGIES:  1. CODEINE.  2. PENICILLIN.   She is on no chronic medications.   Discontinued smoking 10 years ago, has alcohol rarely, does not use  illicit drugs.   FAMILY HISTORY:  May be positive for coronary atherosclerosis.  She  states that her Dad had a problem where his heart was enlarged and could  be of the type that blows up.   PAST MEDICAL HISTORY:  1. Obesity.  2. Hyperlipidemia.   PHYSICAL EXAMINATION:  GENERAL:  The patient is in no acute distress.  She is sitting in a chair in the stress lab at [facility].  VITAL SIGNS:  Her blood pressure is 100/60, heart rate is 62,  respirations 18, temperature  97.6, room air O2 sat is 100%.  SKIN:  Warm and dry.  HEENT:  Unremarkable.  NECK:  No JVD or carotid bruits.  Thyroid is not palpable.  LUNGS:  Clear to auscultation and percussion.  S1 and S2 are normal.  No  gallop, no rub, no click.  ABDOMEN:  Soft.  No obvious masses.  No tenderness.  EXTREMITIES:  Reveal no edema.  Pedal pulses and radial pulses are  symmetrically 2+.  SKIN:  Warm and dry.  No cyanosis is noted.   Chest x-ray reveals no acute abnormality.  A pulmonary CT angio was  performed because of an elevated D-dimer of 0.6.  The CT angio did not  reveal pulmonary emboli or any acute intrathoracic process.  BUN and  creatinine are normal at 16 and 0.87.  Potassium is 3.9, hemoglobin is  14.7.   DISCUSSION:  Atypical presentation.  Given risk factors including  obesity, elevated lipids, prior smoking history, and family history,  coronary artery disease should be excluded.  An adenosine Cardiolite  study is a reasonable way to assess for evidence of coronary disease  that may explain her symptoms.      Lyn Records, M.D.  Electronically Signed     HWS/MEDQ  D:  07/28/2006  T:  07/28/2006  Job:  [id number]   cc:   Lavonda Jumbo, M.D.

## 2010-06-23 NOTE — Discharge Summary (Signed)
Heather Haley, Heather Haley NO.:  [id number]   MEDICAL RECORD NO.:  [id number]          PATIENT TYPE:  INP   LOCATION:  4736                         FACILITY:  [facility]   PHYSICIAN:  Barnetta Chapel, MDDATE OF BIRTH:  [birth year]   DATE OF ADMISSION:  07/28/2006  DATE OF DISCHARGE:  07/30/2006                               DISCHARGE SUMMARY   PRIMARY CARE Taelyn Nemes:  Lavonda Jumbo, M.D.   DISCHARGE DIAGNOSIS:  Chest pain, likely secondary to gastroesophageal  reflux disease.   DISCHARGE MEDICATIONS:  1. Protonix 40 mg p.o. b.i.d.  2. Ultram one tablet p.o. q.i.d. as needed.   CONSULTATIONS:  Cardiology consultation by Lyn Records, M.D., for  chest pain.   PROCEDURES:  1. Cardiac catheterization.  This was essentially normal.  2. The patient has had adenosine Cardiolite that revealed anterior      apical reversible ischemia.   IMAGING STUDIES DONE:  Echocardiogram.  The ejection fraction ranges  from 60 to 65%.  The left atrium is mildly dilated.   BRIEF HISTORY AND HOSPITAL COURSE:  Please refer to the history and  physical dated July 27, 2006.  The patient is a 50 year old female with  past medical history significant for obesity and borderline dyslipidemia  and GERD.  The patient was admitted with chest pain.  The chest pain was  thought to be atypical.  D-dimer done on admission was elevated.  However, CT angiogram of the chest was negative for PE.  Cardiology  consult was called and cardiac stress test was advised.  The adenosine  Cardiolite came back positive for reversible ischemia at the anterior  apical region.  The patient eventually had a cardiac catheterization  that was essentially normal.  An echocardiogram was done to rule out  HOCM.  The echo came back with an EF of 60-65% and mild left atrial  dilatation.  The aortic root was normal.  The patient has done well and  will be discharged back home on Protonix 40 mg p.o. b.i.d.  She will use  Ultram for pains.  She has been apprised to avoid NSAIDs.  She has also  been advised to avoid heavy diets and eating heavily prior to lying  flat.   DISCHARGE PLANS:  1. Discharge the patient home today.  2. Follow up with Dr. Joselyn Arrow in one week.  3. A low-fat diet.  4. Activity as tolerated.      Barnetta Chapel, MD  Electronically Signed     SIO/MEDQ  D:  07/30/2006  T:  07/30/2006  Job:  [id number]   cc:   Lavonda Jumbo, M.D.

## 2010-06-24 ENCOUNTER — Ambulatory Visit (INDEPENDENT_AMBULATORY_CARE_PROVIDER_SITE_OTHER): Payer: Self-pay | Admitting: Sports Medicine

## 2010-06-24 VITALS — BP 143/92

## 2010-06-24 DIAGNOSIS — M7750 Other enthesopathy of unspecified foot: Secondary | ICD-10-CM

## 2010-06-24 DIAGNOSIS — M25579 Pain in unspecified ankle and joints of unspecified foot: Secondary | ICD-10-CM

## 2010-06-24 NOTE — Progress Notes (Signed)
  Subjective:    Patient ID: Heather Haley, female    DOB: 1960/08/04, 50 y.o.   MRN: [id number]  HPI Patient is seen today after using NTG therapy for peroneal tendon split since Feb.21.   Since starting NTG has had some reduction in pain but only 10 to 20%. She continues to use rigid brace and aircast splint underneath this to control ankle motion/ alternating with an ASO softer brace when the more rigid one causes pressure on ankle  Current status is that she can't stand more than 45 mins without significant pain Sitting results in stiffness in about 30 mins so she usually gets up and moves around after this  [place] scanning shows what is highly likely to be a tear in peroneus brevis tendon so the NTG therapy is an attempt to promote biologic healing as the other alternative is another surgery However, she has already had two surgeries by Dr Lestine Box to try to resolve the ankle injury and is reluctant to do another as they have not resulted in her recovery to better function than noted above  NTG has not caused sideeffects - still at 1/4 of 0.2 patch   Review of Systems     Objective:   Physical Exam NAD, pleasant and cooperative  Left ankle Dorsiflexion is only to neutral Plantar flexion 30 deg Inversion is limited to 10 deg or so Eversion is painful and limited to 5 deg Neutral foot position - internal rotation to 20 deg and feels pain at endpoint/ external rotation causes pain and is limited to 10 deg Still swelling and TTP over peroneal tendon sheath  MSK [place] There is still a split noted in per brevis tendon just post and below malleolus Above malleolus and distal to malleolus the tendon appers normal At peroneal tubercle - brevis shows swelling and widening Tendon thickness today is 0.93 vs 1.03 cms on last visit       Assessment & Plan:

## 2010-06-24 NOTE — Assessment & Plan Note (Signed)
Will increase the NTG to 1/2 patch  Plan to see how she responds and rescan in 2 mos  Would continue this for a full 6 months before deciding if this is working or not

## 2010-06-24 NOTE — Assessment & Plan Note (Signed)
Still with pain Use tramadol tid for pain Severe pain will take a vicodin - usually uses once or twice a week  Will cont this  Try some warm baths with motion

## 2010-08-17 ENCOUNTER — Ambulatory Visit (INDEPENDENT_AMBULATORY_CARE_PROVIDER_SITE_OTHER): Payer: Worker's Compensation | Admitting: Sports Medicine

## 2010-08-17 ENCOUNTER — Encounter: Payer: Self-pay | Admitting: Sports Medicine

## 2010-08-17 VITALS — BP 136/90 | HR 70

## 2010-08-17 DIAGNOSIS — M7750 Other enthesopathy of unspecified foot: Secondary | ICD-10-CM

## 2010-08-17 DIAGNOSIS — M25579 Pain in unspecified ankle and joints of unspecified foot: Secondary | ICD-10-CM

## 2010-08-17 NOTE — Progress Notes (Signed)
  Subjective:    Patient ID: Heather Haley, female    DOB: Oct 28, 1960, 50 y.o.   MRN: [id number]  HPI  Pt presents to clinic for f/u left ankle she reports 5-10% improvement.  Has started working at [employer] and has been on feet more lately which has increased pain because she is not able to elevate.  Has some swelling at work, and foot dropping.  Elevation helps discomfort.  Takes vicodin about 3 times per week, and tramadol 3-4 times per day.  Using 1/2 NTG patch daily.   Overall she does feel that she is making some slow progress. She feels that she would have less pain if she can elevate her foot better at work and continued to spend a limited amount of time walking or on her feet   Review of Systems     Objective:   Physical Exam     Less swelling behind lateral mallelous than previously Inversion and eversion still slightly painful Ankle ligaments stable  Large surgical scar lateral side of ankle from 7 cm above ankle to 5 MT AT non tender Has skin abrasion behind surgical scar from shaving  MSK ultrasound Peroneal tendon is visualized again. The peroneal tendons behind and inferior to the malleolus remain quite thickened. There is an area very consistent with a split in the peroneal brevis. Compared to last exam there is somewhat less edema and there is much less Doppler flow than before. There does appear to be some by neo- vessel activity at this area of the tendon. The thickness of the tendon remains enlarged at approximately 1 cm   Assessment & Plan:

## 2010-08-17 NOTE — Patient Instructions (Signed)
Continue 1/2 nitroglycerin patch daily.  Change every 24 hours.   Continue using bracing as before  No change in other medications  For work activities:    1. Limit standing to no more than 10 minutes at a time once per hour 2. When sitting elevate left leg to 30 degrees whenever possible   Return for follow up in 2 months

## 2010-08-17 NOTE — Assessment & Plan Note (Signed)
We will continue using the nitroglycerin patch hoping that this will gradually stimulate some healing to the split area of tendon. Otherwise she should continue using the same splint support. I modified my recommendations so that she can prop her left leg up somewhat at work and should continue to limit her standing.  Recheck with me in approximately 2 months

## 2010-08-17 NOTE — Assessment & Plan Note (Signed)
Pain appears to be reasonably controlled with a regular dose of tramadol and an occasional Vicodin for rescue pain.  She does continue to wear her Aircast ankle splint along with an AFO for her shoe.

## 2010-09-08 ENCOUNTER — Emergency Department (HOSPITAL_COMMUNITY)
Admission: EM | Admit: 2010-09-08 | Discharge: 2010-09-08 | Disposition: A | Discharge status: Home / Self Care | Payer: Worker's Compensation | Attending: Emergency Medicine | Admitting: Emergency Medicine

## 2010-09-08 ENCOUNTER — Emergency Department (HOSPITAL_COMMUNITY): Payer: Worker's Compensation

## 2010-09-08 DIAGNOSIS — F329 Major depressive disorder, single episode, unspecified: Secondary | ICD-10-CM | POA: Insufficient documentation

## 2010-09-08 DIAGNOSIS — R296 Repeated falls: Secondary | ICD-10-CM | POA: Insufficient documentation

## 2010-09-08 DIAGNOSIS — I10 Essential (primary) hypertension: Secondary | ICD-10-CM | POA: Insufficient documentation

## 2010-09-08 DIAGNOSIS — S99929A Unspecified injury of unspecified foot, initial encounter: Secondary | ICD-10-CM | POA: Insufficient documentation

## 2010-09-08 DIAGNOSIS — M25569 Pain in unspecified knee: Secondary | ICD-10-CM | POA: Insufficient documentation

## 2010-09-08 DIAGNOSIS — Z79899 Other long term (current) drug therapy: Secondary | ICD-10-CM | POA: Insufficient documentation

## 2010-09-08 DIAGNOSIS — F3289 Other specified depressive episodes: Secondary | ICD-10-CM | POA: Insufficient documentation

## 2010-09-08 DIAGNOSIS — S8990XA Unspecified injury of unspecified lower leg, initial encounter: Secondary | ICD-10-CM | POA: Insufficient documentation

## 2010-09-08 DIAGNOSIS — Z9889 Other specified postprocedural states: Secondary | ICD-10-CM | POA: Insufficient documentation

## 2010-09-18 ENCOUNTER — Encounter: Payer: Self-pay | Admitting: Sports Medicine

## 2010-09-18 ENCOUNTER — Ambulatory Visit (INDEPENDENT_AMBULATORY_CARE_PROVIDER_SITE_OTHER): Payer: Worker's Compensation | Admitting: Sports Medicine

## 2010-09-18 VITALS — BP 146/95

## 2010-09-18 DIAGNOSIS — M25562 Pain in left knee: Secondary | ICD-10-CM

## 2010-09-18 DIAGNOSIS — M25569 Pain in unspecified knee: Secondary | ICD-10-CM

## 2010-09-18 NOTE — Progress Notes (Signed)
  Subjective:    Patient ID: Heather Haley, female    DOB: 05/12/1960, 50 y.o.   MRN: [id number]  HPI 1. Left knee pain. S/p fall on 7/31. Presents for worker's comp evaluation of persistant pain. Patient has chronic ankle problems and requires to wear AFO brace. 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 Was walking outside of the facility on a slanted concrete walkway and became imbalanced. Attempted to correct herself, but fell to the right and her left ankle brace prohibited correction of leg alignment-suffered a valgus stress to the left knee joint. Landed on right elbow, bilateral knees. Was able to stand up and walk to vehicle immediately after injury, but experienced pain in left lateral knee and some swelling. Presented to ED day of injury and x-rays were negative. Since that time has persistent pain and mild swelling. Used crutches and a cane transiently. Difficult to ambulate stairs.  Using prescribed tramadol and percocet for pain.  Review of Systems Denies pain in other joints (except chronic ankle pain).     Objective:   Physical Exam  Vitals reviewed. Constitutional: She appears well-developed and well-nourished. No distress.  Musculoskeletal:       Left knee with moderate effusion. No erythema or warmth. Pain at left lateral joint line. Limited ROM flexion on left 100 deg vs 145 degrees on Rt. Left knee ext negative 5 degrees compared to right. Negative lachman's and no significant joint laxity. Diminished ext on left with bounce home test. Pain with McMurrays at left joint line.    When walking or standing she obviously does not posterior full weight onto her left leg and keeps the leg positions in about 10-15 of knee flexion.  MSK [place] Left knee: shows significant effusion in suprapatellar space. MCL and medial meniscus appears intact. Lateral meniscus and LCL difficult to visualize definitively.         Assessment & Plan:

## 2010-09-18 NOTE — Assessment & Plan Note (Addendum)
Valgus trauma with significant residual effusion, limited ROM, and positive McMurray testing concerning for meniscal injury. Will need MRI to evaluate for injury involvement and treatment modalities. I am concerned based on the persistence of effusion and the inability to completely straighten the leg that she may have some significant intra-articular damage.   Recommend continuation of Tramadol for pain management currently.   Patient requests ramp to assist with stairs in home.

## 2010-09-18 NOTE — Patient Instructions (Signed)
Will fax notes to the [employer] (workers comp) and the will review notes and the MRI order and forward them to [facility] and they will schd MRI for the pt and contact pt with appt time and date. [employer] fax number is [phone], attn: [id number]; phone number is [phone].   Once complete office note is done, we will also write you a letter for you to have a "ramp" as requested.

## 2010-09-28 ENCOUNTER — Other Ambulatory Visit: Payer: Self-pay | Admitting: *Deleted

## 2010-09-28 MED ORDER — TRAMADOL HCL 50 MG PO TABS: ORAL_TABLET | ORAL | Status: DC

## 2010-09-29 ENCOUNTER — Telehealth: Payer: Self-pay | Admitting: *Deleted

## 2010-09-29 NOTE — Telephone Encounter (Signed)
Spoke with pt- she states she has not been scheduled for her MRI yet.  Called the [employer] to follow up- they stated that since this is a new injury it cannot be filed under her old worker's comp claim from 2008.  Pt states she did notify her employer of the fall.  Asked pt to follow up with her employer to make sure the claim was filed, then let us know and MRI request can be resubmitted.  Pt expressed understanding and will f/u.

## 2010-10-19 ENCOUNTER — Encounter: Payer: Self-pay | Admitting: Sports Medicine

## 2010-10-19 ENCOUNTER — Ambulatory Visit (INDEPENDENT_AMBULATORY_CARE_PROVIDER_SITE_OTHER): Payer: Worker's Compensation | Admitting: Sports Medicine

## 2010-10-19 VITALS — BP 125/86 | HR 63 | Ht 65.0 in | Wt 260.0 lb

## 2010-10-19 DIAGNOSIS — M25569 Pain in unspecified knee: Secondary | ICD-10-CM

## 2010-10-19 DIAGNOSIS — M25579 Pain in unspecified ankle and joints of unspecified foot: Secondary | ICD-10-CM

## 2010-10-19 DIAGNOSIS — M25562 Pain in left knee: Secondary | ICD-10-CM

## 2010-10-19 NOTE — Progress Notes (Signed)
  Subjective:    Patient ID: Heather Haley, female    DOB: 07-20-1960, 50 y.o.   MRN: [id number]  HPI 50 y/o female is here for follow up for chronic ankle pain secondary to peroneal tendonopathy vs tear which has failed surgery twice.  She has been on a NTG patch for about 9 months.  She feels like it helps.  The symptoms are similar to the last visit.  She is having difficulty with her ASO because it bunches and causes her to get heel pain.  She was seen for knee pain after falling at work.  She says that this pain is somewhat improved but not resolved.  She tries to walk with the knee locked out to alleviate the pain.  The swelling is improved. She still can't fully straighten the left knee    Review of Systems     Objective:   Physical Exam  Left knee: +effusion without any obvious bony abnormalities. Tender to palpation over bilateral joint lines. Motion -10 to 110 (right motion is 0-130) Non painful patellar compression.  Left foot/ankle: Surgical scar behind the lateral malleolus Mild edema over the lateral malleolus Tender to palpation over the area of the peroneal tendons. No tenderness at the base of the 5th metatarsal Mild edema over the heel where the ASO overlaps, no tenderness in this area Plantar and doriflexes without pain. +pain with eversion and inversion No tenderness over the metarsals.           Assessment & Plan:

## 2010-10-19 NOTE — Assessment & Plan Note (Addendum)
Suspect this is a meniscal injury that will require an MRI for confirmation and surgical repair.  Plan to contact workman's comp with that recommendation.

## 2010-10-19 NOTE — Patient Instructions (Addendum)
1. Continue wearing your ankle brace.  2. You need an MRI of the left knee and a consultation with an orthopedic surgeon since we feel that the left knee is locked.  3. Start to wean off of your patches.  Start using it every other day.  4. You will get a call regarding your follow up appointment.  5. Will fax all records and orders to  [facility], Attn: Anola Gurney at [phone]. They will schedule the MRI and Ortho Referral there.

## 2010-10-19 NOTE — Assessment & Plan Note (Addendum)
Chronic and may be non-repairable.  Plan to wean the NTG.  Continue both braces.  The ASO has been adjusted for her comfort.    She may periodically require some pain medications when she is too much swelling or a flare

## 2010-11-25 LAB — CBC
HCT: 38.4
Hemoglobin: 12.9
MCHC: 33.5
MCV: 85.1
MCV: 86.4
Platelets: 250
RBC: 4.44
RBC: 5.11
RDW: 12.9
WBC: 6.9
WBC: 8.9

## 2010-11-25 LAB — I-STAT EC8
Acid-base deficit: 4 — ABNORMAL HIGH
BUN: 11
Bicarbonate: 21.6
Chloride: 108
Glucose, Bld: 162 — ABNORMAL HIGH
HCT: 33 — ABNORMAL LOW
Hemoglobin: 11.2 — ABNORMAL LOW
Operator id: 123161
Potassium: 4
Sodium: 141
TCO2: 23
pCO2 arterial: 41.4
pH, Arterial: 7.324 — ABNORMAL LOW

## 2010-11-25 LAB — COMPREHENSIVE METABOLIC PANEL
ALT: 23
AST: 22
CO2: 25
Chloride: 105
GFR calc Af Amer: >60
GFR calc non Af Amer: >60
Sodium: 136
Total Bilirubin: 0.8

## 2010-11-25 LAB — POCT I-STAT 3, ART BLOOD GAS (G3+)
Acid-base deficit: 3 — ABNORMAL HIGH
Acid-base deficit: 5 — ABNORMAL HIGH
Bicarbonate: 21
Bicarbonate: 22.1
O2 Saturation: 94
O2 Saturation: 98
Operator id: 123161
Operator id: 123161
Patient temperature: 37.9
Patient temperature: 37.9
TCO2: 22
TCO2: 23
pCO2 arterial: 38.8
pCO2 arterial: 43.5
pH, Arterial: 7.296 — ABNORMAL LOW
pH, Arterial: 7.367
pO2, Arterial: 120 — ABNORMAL HIGH
pO2, Arterial: 78 — ABNORMAL LOW

## 2010-11-25 LAB — DIFFERENTIAL
Basophils Absolute: 0.1
Eosinophils Absolute: 0.2
Eosinophils Relative: 2

## 2010-11-25 LAB — CARDIAC PANEL(CRET KIN+CKTOT+MB+TROPI)
CK, MB: 0.8
Total CK: 62

## 2010-11-25 LAB — BASIC METABOLIC PANEL
BUN: 11
BUN: 12
CO2: 28
Calcium: 8 — ABNORMAL LOW
Chloride: 108
Creatinine, Ser: 0.75
GFR calc Af Amer: >60
GFR calc non Af Amer: >60
GFR calc non Af Amer: >60
Glucose, Bld: 101 — ABNORMAL HIGH
Potassium: 3.9
Potassium: 4.1
Sodium: 140
Sodium: 140

## 2010-11-25 LAB — D-DIMER, QUANTITATIVE: D-Dimer, Quant: 0.59 — ABNORMAL HIGH

## 2010-11-25 LAB — CK: Total CK: 54

## 2010-11-25 LAB — PROTIME-INR: Prothrombin Time: 12.6

## 2010-11-25 LAB — H. PYLORI ANTIBODY, IGG: H Pylori IgG: >8 — ABNORMAL HIGH

## 2010-11-25 LAB — TSH: TSH: 3.347

## 2010-11-25 LAB — HEPARIN LEVEL (UNFRACTIONATED): Heparin Unfractionated: 1.13 — ABNORMAL HIGH

## 2010-12-28 ENCOUNTER — Ambulatory Visit (INDEPENDENT_AMBULATORY_CARE_PROVIDER_SITE_OTHER): Payer: Worker's Compensation | Admitting: Sports Medicine

## 2010-12-28 VITALS — BP 130/70

## 2010-12-28 DIAGNOSIS — M25579 Pain in unspecified ankle and joints of unspecified foot: Secondary | ICD-10-CM

## 2010-12-28 DIAGNOSIS — M7750 Other enthesopathy of unspecified foot: Secondary | ICD-10-CM

## 2010-12-28 MED ORDER — HYDROCODONE-ACETAMINOPHEN 5-500 MG PO TABS: 1.0000 | ORAL_TABLET | Freq: Four times a day (QID) | ORAL | Status: DC | PRN

## 2010-12-28 MED ORDER — TRAMADOL HCL 50 MG PO TABS: ORAL_TABLET | ORAL | Status: DC

## 2010-12-28 NOTE — Assessment & Plan Note (Signed)
Chronic pain with no real change  She does seem to have less pain with the NTG   Will plan to change tramadol to qid  Use no more than 2 vicodin 5/500 daily for rescue pain  Cont antidepressants f/ Dr Tanya Nones  Reck 3 mos

## 2010-12-28 NOTE — Assessment & Plan Note (Signed)
Less swelling  Still probably some element of peroneal tendonopathy as she feels better with NTG  I gave her a sample ankle compression sleeve to see if this would keep swelling down better than prior braces

## 2010-12-28 NOTE — Patient Instructions (Signed)
1. Restart your nitroglycerin patches. If your headache worsens call us.  When you run out of patches call us for a refill.  2. We are adding vicodin for the pain that is not controlled with the tramadol (twice a day).    3. We are reducing the amount of tramadol that you are taking for your chronic pain (4 times a day).  4. You should wear your body helix sleeve on days when you have increased swelling of your ankle or on days when you are going to be on your feet a lot.  5. Follow up with [place] in 3 months.

## 2010-12-28 NOTE — Progress Notes (Signed)
  Subjective:    Patient ID: Heather Haley, female    DOB: Jul 30, 1960, 50 y.o.   MRN: [id number]  HPI  50 y/o female is here to follow up on left ankle pain.  The pain is worse since weaning from the NTG patch.  Ultram is not controlling the pain.  She recently had her depression meds altered and now has headaches from her new medication or maybe as the effexor wears off.  Review of Systems     Objective:   Physical Exam  Left foot/ankle:  Surgical scar behind the lateral malleolus  Tender to palpation over the area of the peroneal tendons.  No tenderness at the base of the 5th metatarsal  Plantar and doriflexes without pain. +pain with eversion and inversion  No tenderness over the metarsals The swelling of her lateral ankle is less than it was on previous exams.       Assessment & Plan:

## 2011-01-04 ENCOUNTER — Telehealth: Payer: Self-pay | Admitting: *Deleted

## 2011-01-04 NOTE — Telephone Encounter (Signed)
Pt states she tried the NTG patches for 5 days and had severe headaches that could not be alleviated by tylenol.  She wanted to let Dr. Darrick Penna know that she was not able to tolerate it.

## 2011-01-07 NOTE — Telephone Encounter (Signed)
Noted be Dr. Darrick Penna.

## 2011-02-04 ENCOUNTER — Other Ambulatory Visit: Payer: Self-pay | Admitting: Sports Medicine

## 2011-02-09 HISTORY — PX: COLONOSCOPY: SHX174

## 2011-02-10 ENCOUNTER — Other Ambulatory Visit: Payer: Self-pay | Admitting: *Deleted

## 2011-02-10 MED ORDER — HYDROCODONE-ACETAMINOPHEN 5-500 MG PO TABS: 1.0000 | ORAL_TABLET | Freq: Four times a day (QID) | ORAL | Status: DC | PRN

## 2011-03-22 ENCOUNTER — Ambulatory Visit (INDEPENDENT_AMBULATORY_CARE_PROVIDER_SITE_OTHER): Payer: BC Managed Care – PPO | Admitting: Sports Medicine

## 2011-03-22 VITALS — BP 144/80

## 2011-03-22 DIAGNOSIS — M25562 Pain in left knee: Secondary | ICD-10-CM

## 2011-03-22 DIAGNOSIS — M25569 Pain in unspecified knee: Secondary | ICD-10-CM

## 2011-03-22 NOTE — Patient Instructions (Signed)
We have scheduled you for an appointment for MRI of left knee on 03/26/11 at 5:30 pm at [facility] at [facility]. [phone]

## 2011-03-22 NOTE — Assessment & Plan Note (Signed)
XRay revealed mild to moderate tricompartmental DJD  However, I suspect she also has meniscus or articular cartilage injury with her persistent pain and swelling  Schedule MRI  IF + consider orthopedic referral

## 2011-03-22 NOTE — Progress Notes (Signed)
No pre-auth needed for patient's MRI per [employer].

## 2011-03-22 NOTE — Progress Notes (Signed)
  Subjective:    Patient ID: Heather Haley, female    DOB: 20-May-1960, 51 y.o.   MRN: [id number]  HPI  Pt presents to clinic for f/u of lt lateral knee pain which she has experienced since she fell 09/08/10. Hx of arthroscopy lt knee in 1997, had done well until this fall.  She takes vicodin for pain which is helpful.    We saw this in sept and felt she had meniscus injury [place] at that time abnorm Since then she is unable to completely straighten the knee She continues to get swelling and feels weak trying to go up a step   Review of Systems     Objective:   Physical Exam Obese, NAD  Rt knee full flexion and extension, good rotary motion without pain  Lt knee lacks 5 deg full extension, 100 deg flexion Anything past this motion causes pain +effusion + Mcmurray's on lt lateral knee      Assessment & Plan:

## 2011-03-26 ENCOUNTER — Ambulatory Visit
Admission: RE | Admit: 2011-03-26 | Discharge: 2011-03-26 | Disposition: A | Discharge status: Home / Self Care | Payer: Medicare Other | Source: Ambulatory Visit | Attending: Sports Medicine | Admitting: Sports Medicine

## 2011-03-26 DIAGNOSIS — M25562 Pain in left knee: Secondary | ICD-10-CM

## 2011-03-30 ENCOUNTER — Encounter: Payer: Self-pay | Admitting: *Deleted

## 2011-03-30 NOTE — Progress Notes (Signed)
Pt scheduled for an appt at [facility] and Thurston Hole with Dr. Thurston Hole for 04/01/11 at 2:45pm for torn meniscus and loose body seen on her MRI.  Pt notified of appt info today.

## 2011-04-06 ENCOUNTER — Other Ambulatory Visit: Payer: Self-pay | Admitting: Sports Medicine

## 2011-04-19 ENCOUNTER — Ambulatory Visit (INDEPENDENT_AMBULATORY_CARE_PROVIDER_SITE_OTHER): Payer: Worker's Compensation | Admitting: Sports Medicine

## 2011-04-19 VITALS — BP 129/88

## 2011-04-19 DIAGNOSIS — M25579 Pain in unspecified ankle and joints of unspecified foot: Secondary | ICD-10-CM

## 2011-04-19 MED ORDER — AMITRIPTYLINE HCL 25 MG PO TABS: 25.0000 mg | ORAL_TABLET | Freq: Every day | ORAL | Status: DC

## 2011-04-19 NOTE — Assessment & Plan Note (Signed)
She continues to have chronic pain  I don't think we should change the bracing as this seems adequate  She does not overuse her vicodin and usually gets by on 100 tramadol tid and uses vicodin up to bid for flares  I suggested trying to add low dose amitriptyline just at night as she still wakes up with pain and has already been on effexor for months  Also try some topical capzin cream in case there is some superficial nerve injury f prior surgeries to left ankle  She is to let us know how this does in 1 month  She will be nearing surgery at that time  We will see her pending her response and recovery f surgery  She will cont med eval withDr Tanya Nones

## 2011-04-19 NOTE — Progress Notes (Signed)
  Subjective:    Patient ID: Heather Haley, female    DOB: 06-21-1960, 51 y.o.   MRN: [id number]  HPI  Pt presents to clinic for f/u Lt ankle which has been more painful this past week Using compression brace and AFO.  Does have some shooting pains lateral dorsal aspect of foot.  Icing causes more comfort than heat.  Walking and standing causes increased ankle pain. No recent falls or injuries.  Having surgery on lt knee April 1st Dr. Thurston Hole. Has cartilage injury and DJD in left knee.    Review of Systems     Objective:   Physical Exam NAD  Lt ankle exam: Pain below med malleolus  Swelling along post tib tendon Squeeze test hurts above lt lat mall where she has surgical scar Lt peroneals less swollen than previously No other changes noted in exam from prior visits      Assessment & Plan:

## 2011-04-19 NOTE — Patient Instructions (Signed)
Start amitriptyline 25 mg tab at bedtime this was sent to [facility] [address]  Try capsaicin cream over sensitive areas on your ankle and foot  Continue using ankle brace and AFO   Please follow up as needed  Thank you for seeing [place] today!

## 2011-04-28 ENCOUNTER — Ambulatory Visit (INDEPENDENT_AMBULATORY_CARE_PROVIDER_SITE_OTHER): Payer: BC Managed Care – PPO | Admitting: Sports Medicine

## 2011-04-28 VITALS — BP 129/91

## 2011-04-28 DIAGNOSIS — M25562 Pain in left knee: Secondary | ICD-10-CM

## 2011-04-28 DIAGNOSIS — M25569 Pain in unspecified knee: Secondary | ICD-10-CM

## 2011-04-28 NOTE — Progress Notes (Signed)
  Subjective:    Patient ID: Heather Haley, female    DOB: February 20, 1960, 51 y.o.   MRN: [id number]  HPI  Patient presents to clinic for follow up left knee pain.  This has been a chronic problem and patient was scheduled for knee replacement on 4/1, but this has been postponed due to cardiovascular issues.  Patient is to be evaluated by cardiology before scheduling sx.  Patient wishes to have left knee injection today for pain relief in the meantime.  Left knee pain is located lateral to patella and radiates to posterior knee.  At time, knee locks up with prolonged walking.  Better with rest.  She currently takes Tramadol 100 mg BID (somtimes TID) and Vicodin 1 tablet daily PRN severe pain.  Patient was advised to hold off on Amitriptyline until evaluated by cardiology.  Patient able to bear weight on left knee, she wears both an ankle brace and AFO.    Review of Systems  Denies any active CP, SOB, fever, chills, nausea or vomiting.    Objective:   Physical Exam  Knee: Inspection revealed moderate swelling and effusion Left knee, with no erythema. Mild tenderness on palpation joint line tenderness and patellar tenderness. Left knee in flexion 30 deg, unable to fully extend.  Flexion limited to 60 degrees. Ligaments with solid consistent endpoints including ACL, PCL, LCL, MCL. Mild tenderness with patellar compression. Patellar and quadriceps tendons unremarkable. Quadriceps strength is normal.  Consent obtained and verified. Sterile gel used. Furthur cleansed with alcohol. Topical analgesic spray: Ethyl chloride. Joint: left knee Approached in typical fashion under US guidance. Completed without difficulty. Meds: 1 cc Solumedrol 40, 4 cc Lidocaine 1% Needle:25 gauge 1.5  Aftercare instructions and Red flags advised.        Assessment & Plan:

## 2011-04-28 NOTE — Patient Instructions (Signed)
Joint Injection Care After Refer to this sheet in the next few days. These instructions provide you with information on caring for yourself after you have had a joint injection. Your caregiver also may give you more specific instructions. Your treatment has been planned according to current medical practices, but problems sometimes occur. Call your caregiver if you have any problems or questions after your procedure. After any type of joint injection, it is not uncommon to experience:  Soreness, swelling, or bruising around the injection site.   Mild numbness, tingling, or weakness around the injection site caused by the numbing medicine used before or with the injection.  It also is possible to experience the following effects associated with the specific agent after injection:  Iodine-based contrast agents:   Allergic reaction (itching, hives, widespread redness, and swelling beyond the injection site).   Corticosteroids (These effects are rare.):   Allergic reaction.   Increased blood sugar levels (If you have diabetes and you notice that your blood sugar levels have increased, notify your caregiver).   Increased blood pressure levels.   Mood swings.   Hyaluronic acid in the use of viscosupplementation.   Temporary heat or redness.   Temporary rash and itching.   Increased fluid accumulation in the injected joint.  These effects all should resolve within a day after your procedure.  HOME CARE INSTRUCTIONS  Limit yourself to light activity the day of your procedure. Avoid lifting heavy objects, bending, stooping, or twisting.   Take prescription or over-the-counter pain medication as directed by your caregiver.   You may apply ice to your injection site to reduce pain and swelling the day of your procedure. Ice may be applied 3 to 4 times:   Put ice in a plastic bag.   Place a towel between your skin and the bag.   Leave the ice on for no longer than 15 to 20 minutes  each time.  SEEK IMMEDIATE MEDICAL CARE IF:   Pain and swelling get worse rather than better or extend beyond the injection site.   Numbness does not go away.   Blood or fluid continues to leak from the injection site.   You have chest pain.   You have swelling of your face or tongue.   You have trouble breathing or you become dizzy.   You develop a fever, chills, or severe tenderness at the injection site that last longer than 1 day.  MAKE SURE YOU:  Understand these instructions.   Watch your condition.   Get help right away if you are not doing well or if you get worse.  Document Released: 10/08/2010 Document Revised: 01/14/2011 Document Reviewed: 10/08/2010 [employer] Patient Information 2012 [employer]. 

## 2011-04-28 NOTE — Assessment & Plan Note (Addendum)
U/S guided steroid injection of left knee joint performed today.  Consent obtained. Continue ankle brace and AFO. Continue Tramadol 100 mg BID and Vicodin 1 tab daily as needed for pain. Patient's L knee replacement sx postponed due to abnormal EKG findings, will be seen by cardiologist prior to sx. Follow up as needed.  Note MRI shows intra-articular cartilage damage and advanced DJD Injection to help with sxs until surgery is OK

## 2011-05-27 ENCOUNTER — Other Ambulatory Visit: Payer: Self-pay | Admitting: *Deleted

## 2011-05-27 DIAGNOSIS — M25562 Pain in left knee: Secondary | ICD-10-CM

## 2011-06-01 ENCOUNTER — Other Ambulatory Visit: Payer: Self-pay | Admitting: Family Medicine

## 2011-06-01 DIAGNOSIS — R6884 Jaw pain: Secondary | ICD-10-CM

## 2011-06-04 ENCOUNTER — Ambulatory Visit
Admission: RE | Admit: 2011-06-04 | Discharge: 2011-06-04 | Disposition: A | Discharge status: Home / Self Care | Payer: Self-pay | Source: Ambulatory Visit | Attending: Family Medicine | Admitting: Family Medicine

## 2011-06-04 DIAGNOSIS — R6884 Jaw pain: Secondary | ICD-10-CM

## 2011-06-24 ENCOUNTER — Encounter: Payer: Self-pay | Admitting: *Deleted

## 2011-07-21 ENCOUNTER — Encounter: Payer: Self-pay | Admitting: Sports Medicine

## 2011-08-16 ENCOUNTER — Encounter (HOSPITAL_COMMUNITY): Payer: Self-pay | Admitting: Pharmacy Technician

## 2011-08-17 ENCOUNTER — Other Ambulatory Visit: Payer: Self-pay | Admitting: Physician Assistant

## 2011-08-17 ENCOUNTER — Encounter: Payer: Self-pay | Admitting: Physician Assistant

## 2011-08-17 DIAGNOSIS — Z9889 Other specified postprocedural states: Secondary | ICD-10-CM | POA: Insufficient documentation

## 2011-08-17 DIAGNOSIS — R112 Nausea with vomiting, unspecified: Secondary | ICD-10-CM

## 2011-08-17 DIAGNOSIS — F329 Major depressive disorder, single episode, unspecified: Secondary | ICD-10-CM

## 2011-08-17 DIAGNOSIS — K589 Irritable bowel syndrome without diarrhea: Secondary | ICD-10-CM

## 2011-08-17 DIAGNOSIS — M1712 Unilateral primary osteoarthritis, left knee: Secondary | ICD-10-CM

## 2011-08-17 DIAGNOSIS — M25372 Other instability, left ankle: Secondary | ICD-10-CM

## 2011-08-17 DIAGNOSIS — F32A Depression, unspecified: Secondary | ICD-10-CM

## 2011-08-17 DIAGNOSIS — F331 Major depressive disorder, recurrent, moderate: Secondary | ICD-10-CM | POA: Insufficient documentation

## 2011-08-17 DIAGNOSIS — I10 Essential (primary) hypertension: Secondary | ICD-10-CM

## 2011-08-17 NOTE — H&P (Signed)
Heather Haley is an 51 y.o. female.   Chief Complaint: left knee DJD HPI: Heather Haley is a 51 year old seen for follow-up from degenerative joint disease and acute exacerbation of her left knee secondary to a work related injury on 08/29/10 when she fell injuring her left knee. She's had significant pain since the fall. She had a MRI that showed tricompartmental arthritis rather severe with small meniscal tears and loose body. She's had multiple ankle surgeries the last done in 2009 by Dr. Lestine Box for ligamentous reconstruction. She uses an AFO because of this.  Past Medical History  Diagnosis Date  . Hypertension   . IBS (irritable bowel syndrome)   . Postoperative nausea and vomiting   . Diabetes mellitus   . Left ankle instability   . Headache   . Left knee DJD   . Depression     Past Surgical History  Procedure Date  . Ankle surgery   . Abdominal hysterectomy   . Knee arthroscopy 1997    left knee    Family History  Problem Relation Age of Onset  . Cancer Mother   . Hypertension Father   . Heart disease Father    Social History:  reports that she quit smoking about 16 years ago. She has never used smokeless tobacco. She reports that she drinks alcohol. She reports that she does not use illicit drugs.  Allergies:  Allergies  Allergen Reactions  . Codeine     REACTION: hives  . Nsaids     REACTION: palpitations, diaphoresis  . Penicillins     REACTION: upset stomach   Current Outpatient Prescriptions on File Prior to Visit  Medication Sig Dispense Refill  . acetaminophen (TYLENOL) 500 MG tablet Take 500 mg by mouth 3 (three) times daily. Taken with tramadol      . cetirizine (ZYRTEC) 10 MG tablet Take 10 mg by mouth at bedtime.      Marland Kitchen desvenlafaxine (PRISTIQ) 50 MG 24 hr tablet Take 50 mg by mouth daily.       Marland Kitchen docusate sodium (COLACE) 100 MG capsule Take 200 mg by mouth 2 (two) times daily as needed. 200 mg every morning and if needed 200 mg in the evening       . fluticasone (FLONASE) 50 MCG/ACT nasal spray Place 2 sprays into the nose daily.       Marland Kitchen HYDROcodone-acetaminophen (VICODIN) 5-500 MG per tablet Take 1 tablet by mouth 2 (two) times daily as needed. For pain      . Hypromellose (ARTIFICIAL TEARS OP) Apply 2 drops to eye 4 (four) times daily as needed. Dry eye      . metFORMIN (GLUCOPHAGE) 850 MG tablet Take 850 mg by mouth 2 (two) times daily with a meal.      . omeprazole (PRILOSEC) 20 MG capsule Take 20 mg by mouth daily.      . pravastatin (PRAVACHOL) 40 MG tablet Take 40 mg by mouth daily.      . promethazine (PHENERGAN) 25 MG tablet Take 25 mg by mouth every 6 (six) hours as needed. For nausea      . propranolol (INNOPRAN XL) 80 MG 24 hr capsule Take 80 mg by mouth at bedtime.       . traMADol (ULTRAM) 50 MG tablet Take 100 mg by mouth 3 (three) times daily.      Marland Kitchen zolpidem (AMBIEN) 10 MG tablet Take 10 mg by mouth at bedtime as needed. For sleep        (  Not in a hospital admission)  No results found for this or any previous visit (from the past 48 hour(s)). No results found.  Review of Systems  Constitutional: Negative.   HENT: Negative for hearing loss, ear pain, nosebleeds, congestion, sore throat, tinnitus and ear discharge.   Eyes: Positive for blurred vision (recently poked in the eye) and pain.  Respiratory: Negative.  Negative for stridor.   Cardiovascular: Negative.   Gastrointestinal: Positive for diarrhea and constipation.  Genitourinary: Positive for frequency.  Musculoskeletal: Positive for joint pain.       Left knee  Skin: Positive for itching and rash.  Neurological: Positive for headaches.  Endo/Heme/Allergies: Negative.   Psychiatric/Behavioral: Negative.     Blood pressure 145/92, pulse 70, temperature 98 F (36.7 C), height 5\' 5"  (1.651 m), weight 126.1 kg (278 lb), SpO2 95.00%. Physical Exam  Constitutional: She appears well-developed and well-nourished.  HENT:  Head: Normocephalic and atraumatic.    Mouth/Throat: Oropharynx is clear and moist.  Eyes: Conjunctivae and EOM are normal. Pupils are equal, round, and reactive to light.  Neck: Normal range of motion.  Cardiovascular: Normal rate and regular rhythm.   Respiratory: Effort normal and breath sounds normal.  GI: Bowel sounds are normal.  Genitourinary:       Not pertinent to current symptomatology therefore not examined.  Musculoskeletal:       Examination of her left knee reveals pain medially and laterally. 1 to 2+ crepitation, 1+ synovitis, range of motion -5 to 125 degrees varus deformity is noted, knee is stable with normal patella tracking. Exam of her right knee reveals 1+ crepitation 1+ synovitis, range of motion 0-125 degrees knee is stable with normal patella tracking. Vascular exam: pulses 2+ and symmetric.  Neurological: She is alert.  Skin: Skin is warm and dry.  Psychiatric: She has a normal mood and affect. Her behavior is normal. Judgment and thought content normal.    X-RAYS: Left knee standing AP lateral flexion and sunrise view and a new long standing x-ray shows significant degenerative joint disease with mild varus deformity. Periarticular spurring and sub chondral sclerosis  Assessment Patient Active Problem List  Diagnosis  . ANKLE PAIN, LEFT  . OTHER ENTHESOPATHY OF ANKLE AND TARSUS  . FOOT PAIN, LEFT  . ADVERSE DRUG REACTION  . Left knee pain  . Hypertension  . IBS (irritable bowel syndrome)  . Postoperative nausea and vomiting  . Left ankle instability  . Left knee DJD  . Depression    Plan I talk to her about this in detail. Would recommend with her significant persistent pain lack of response to conservative care that we proceed with left total knee replacement. Discussed risks benefits and possible complications of the surgery in detail and she understands this completely. She will be placed in a SNF post-op because she lives alone. We'll plan on setting her up for this at some point in the  near future. Going to [facility].  Cleared by Dr Jennell Corner 08/17/2011, 5:14 PM

## 2011-08-23 ENCOUNTER — Encounter (HOSPITAL_COMMUNITY)
Admission: RE | Admit: 2011-08-23 | Discharge: 2011-08-23 | Disposition: A | Discharge status: Home / Self Care | Payer: Worker's Compensation | Source: Ambulatory Visit | Attending: Physician Assistant | Admitting: Physician Assistant

## 2011-08-23 ENCOUNTER — Encounter (HOSPITAL_COMMUNITY)
Admission: RE | Admit: 2011-08-23 | Discharge: 2011-08-23 | Disposition: A | Discharge status: Home / Self Care | Payer: Worker's Compensation | Source: Ambulatory Visit | Attending: Orthopedic Surgery | Admitting: Orthopedic Surgery

## 2011-08-23 ENCOUNTER — Encounter (HOSPITAL_COMMUNITY): Payer: Self-pay

## 2011-08-23 HISTORY — DX: Frequency of micturition: R35.0

## 2011-08-23 LAB — URINALYSIS, ROUTINE W REFLEX MICROSCOPIC
Bilirubin Urine: NEGATIVE
Glucose, UA: NEGATIVE mg/dL
Hgb urine dipstick: NEGATIVE
Specific Gravity, Urine: 1.023 (ref 1.005–1.030)
Urobilinogen, UA: 0.2 mg/dL (ref 0.0–1.0)
pH: 5 (ref 5.0–8.0)

## 2011-08-23 LAB — COMPREHENSIVE METABOLIC PANEL
ALT: 47 U/L — ABNORMAL HIGH (ref 0–35)
CO2: 23 mEq/L (ref 19–32)
Calcium: 10.1 mg/dL (ref 8.4–10.5)
Creatinine, Ser: 0.79 mg/dL (ref 0.50–1.10)
GFR calc Af Amer: >90 mL/min (ref >90)
GFR calc non Af Amer: >90 mL/min (ref >90)
Glucose, Bld: 213 mg/dL — ABNORMAL HIGH (ref 70–99)
Sodium: 138 mEq/L (ref 135–145)

## 2011-08-23 LAB — DIFFERENTIAL
Basophils Absolute: 0.1 10*3/uL (ref 0.0–0.1)
Eosinophils Relative: 6 % — ABNORMAL HIGH (ref 0–5)
Lymphocytes Relative: 31 % (ref 12–46)

## 2011-08-23 LAB — CBC
HCT: 43.3 % (ref 36.0–46.0)
Hemoglobin: 14.3 g/dL (ref 12.0–15.0)
MCH: 29.6 pg (ref 26.0–34.0)
MCV: 89.6 fL (ref 78.0–100.0)
RBC: 4.83 MIL/uL (ref 3.87–5.11)

## 2011-08-23 LAB — PROTIME-INR: Prothrombin Time: 13.3 seconds (ref 11.6–15.2)

## 2011-08-23 LAB — TYPE AND SCREEN: Antibody Screen: NEGATIVE

## 2011-08-23 NOTE — Pre-Procedure Instructions (Signed)
19 Heather Haley  08/23/2011   Your procedure is scheduled on:  08-10-2011  Report to [facility] at 9:15 AM.  Call this number if you have problems the morning of surgery: [phone]   Remember:   Do not eat food or drink:After Midnight      Take these medicines the morning of surgery with A SIP OF WATER: tylenol as needed, pristiq,flonase  Nasal spray,pain medication as needed,omeprazole,              No diabetes medications the morning of surgery  Do not wear jewelry, make-up or nail polish.  Do not wear lotions, powders, or perfumes. You may wear deodorant.  Do not shave 48 hours prior to surgery. Men may shave face and neck.  Do not bring valuables to the hospital.  Contacts, dentures or bridgework may not be worn into surgery.  Leave suitcase in the car. After surgery it may be brought to your room.  For patients admitted to the hospital, checkout time is 11:00 AM the day of discharge    Special Instructions: Incentive Spirometry - Practice and bring it with you on the day of surgery. and CHG Shower Use Special Wash: 1/2 bottle night before surgery and 1/2 bottle morning of surgery.   Please read over the following fact sheets that you were given: Pain Booklet, Coughing and Deep Breathing, Blood Transfusion Information, MRSA Information and Surgical Site Infection Prevention

## 2011-08-23 NOTE — Progress Notes (Signed)
Requested EKG from [facility]  Requested Dr. Verdis Prime office notes ,EKG and any cardiac testing done.

## 2011-08-24 LAB — URINE CULTURE
Colony Count: NO GROWTH
Culture: NO GROWTH

## 2011-08-25 ENCOUNTER — Ambulatory Visit (INDEPENDENT_AMBULATORY_CARE_PROVIDER_SITE_OTHER): Payer: Worker's Compensation | Admitting: Sports Medicine

## 2011-08-25 VITALS — BP 131/87

## 2011-08-25 DIAGNOSIS — M25579 Pain in unspecified ankle and joints of unspecified foot: Secondary | ICD-10-CM

## 2011-08-25 DIAGNOSIS — M25569 Pain in unspecified knee: Secondary | ICD-10-CM

## 2011-08-25 DIAGNOSIS — M25562 Pain in left knee: Secondary | ICD-10-CM

## 2011-08-25 NOTE — Progress Notes (Signed)
  Subjective:    Patient ID: Heather Haley, female    DOB: 22-Aug-1960, 51 y.o.   MRN: [id number]  HPI  Pt presents to clinic for f/u of lt ankle which is still painful especially laterally and midfoot. States pain is a constant throbbing. Gets some comfort with Ice baths. She has noted significant swelling to lt ankle about 5 times since last visit. Taking tramadol 6 tabs per day for pain and vicodin when pain is severe. Having TKR 08/30/11 by Dr. Thurston Hole.     Review of Systems     Objective:   Physical Exam  Lt foot/ankle exam: Swelling along lt peroneal sheath Lateral foot pain going forward from MT Pain across midfoot No MT bossing Weak on peroneals with eversion Strong dorsi and plantar flexion Weak on inversion and plantar flexion  Walking the left foot oversupinates prob 2/2 peroneal weakness       Assessment & Plan:

## 2011-08-25 NOTE — Assessment & Plan Note (Signed)
Not much for [place] to change at this point Some lateral posting to her left insole She felt that helped lessen pain on outside of foot Cont tramadol and other meds

## 2011-08-25 NOTE — Patient Instructions (Addendum)
Please try insole with added cushion to the outside of your foot   Continue current medications- follow surgeon's directions for pain meds  Thank you for seeing [place] today!

## 2011-08-25 NOTE — Assessment & Plan Note (Signed)
This turned out to be a very arthrtic knee with lots of cartilage damage  TKR next week  meds may need change post that This may help her put less pressure on ankle

## 2011-08-29 MED ORDER — LACTATED RINGERS IV SOLN
INTRAVENOUS | Status: DC
  Administered 2011-08-30: 50 mL/h via INTRAVENOUS

## 2011-08-29 MED ORDER — POVIDONE-IODINE 7.5 % EX SOLN
Freq: Once | CUTANEOUS | Status: DC
  Filled 2011-08-29: qty 118

## 2011-08-29 MED ORDER — CHLORHEXIDINE GLUCONATE 4 % EX LIQD: 60.0000 mL | Freq: Once | CUTANEOUS | Status: DC

## 2011-08-29 MED ORDER — VANCOMYCIN HCL 1000 MG IV SOLR
1500.0000 mg | INTRAVENOUS | Status: AC
  Administered 2011-08-30: 1500 mg via INTRAVENOUS
  Filled 2011-08-29: qty 1500

## 2011-08-30 ENCOUNTER — Encounter (HOSPITAL_COMMUNITY)
Admission: RE | Disposition: A | Discharge status: Skilled Nursing Facility | Payer: Self-pay | Source: Ambulatory Visit | Attending: Orthopedic Surgery

## 2011-08-30 ENCOUNTER — Encounter (HOSPITAL_COMMUNITY): Payer: Self-pay | Admitting: Anesthesiology

## 2011-08-30 ENCOUNTER — Ambulatory Visit (HOSPITAL_COMMUNITY): Payer: Worker's Compensation | Admitting: Anesthesiology

## 2011-08-30 ENCOUNTER — Encounter (HOSPITAL_COMMUNITY): Payer: Self-pay | Admitting: *Deleted

## 2011-08-30 ENCOUNTER — Inpatient Hospital Stay (HOSPITAL_COMMUNITY)
Admission: RE | Admit: 2011-08-30 | Discharge: 2011-09-01 | DRG: 470 | Disposition: A | Discharge status: Skilled Nursing Facility | Payer: Worker's Compensation | Source: Ambulatory Visit | Attending: Orthopedic Surgery | Admitting: Orthopedic Surgery

## 2011-08-30 DIAGNOSIS — M1710 Unilateral primary osteoarthritis, unspecified knee: Principal | ICD-10-CM | POA: Diagnosis present

## 2011-08-30 DIAGNOSIS — Z87891 Personal history of nicotine dependence: Secondary | ICD-10-CM

## 2011-08-30 DIAGNOSIS — R112 Nausea with vomiting, unspecified: Secondary | ICD-10-CM | POA: Diagnosis present

## 2011-08-30 DIAGNOSIS — F331 Major depressive disorder, recurrent, moderate: Secondary | ICD-10-CM | POA: Diagnosis present

## 2011-08-30 DIAGNOSIS — Z886 Allergy status to analgesic agent status: Secondary | ICD-10-CM

## 2011-08-30 DIAGNOSIS — I10 Essential (primary) hypertension: Secondary | ICD-10-CM | POA: Diagnosis present

## 2011-08-30 DIAGNOSIS — K589 Irritable bowel syndrome without diarrhea: Secondary | ICD-10-CM | POA: Diagnosis present

## 2011-08-30 DIAGNOSIS — Z885 Allergy status to narcotic agent status: Secondary | ICD-10-CM

## 2011-08-30 DIAGNOSIS — F3289 Other specified depressive episodes: Secondary | ICD-10-CM | POA: Diagnosis present

## 2011-08-30 DIAGNOSIS — K219 Gastro-esophageal reflux disease without esophagitis: Secondary | ICD-10-CM | POA: Diagnosis present

## 2011-08-30 DIAGNOSIS — Z90710 Acquired absence of both cervix and uterus: Secondary | ICD-10-CM

## 2011-08-30 DIAGNOSIS — M7750 Other enthesopathy of unspecified foot: Secondary | ICD-10-CM | POA: Diagnosis present

## 2011-08-30 DIAGNOSIS — D62 Acute posthemorrhagic anemia: Secondary | ICD-10-CM | POA: Diagnosis not present

## 2011-08-30 DIAGNOSIS — M24873 Other specific joint derangements of unspecified ankle, not elsewhere classified: Secondary | ICD-10-CM | POA: Diagnosis present

## 2011-08-30 DIAGNOSIS — F329 Major depressive disorder, single episode, unspecified: Secondary | ICD-10-CM | POA: Diagnosis present

## 2011-08-30 DIAGNOSIS — Z9889 Other specified postprocedural states: Secondary | ICD-10-CM | POA: Diagnosis present

## 2011-08-30 DIAGNOSIS — Z880 Allergy status to penicillin: Secondary | ICD-10-CM

## 2011-08-30 DIAGNOSIS — M1712 Unilateral primary osteoarthritis, left knee: Secondary | ICD-10-CM

## 2011-08-30 HISTORY — PX: TOTAL KNEE ARTHROPLASTY: SHX125

## 2011-08-30 HISTORY — DX: Gastro-esophageal reflux disease without esophagitis: K21.9

## 2011-08-30 LAB — GLUCOSE, CAPILLARY
Glucose-Capillary: 171 mg/dL — ABNORMAL HIGH (ref 70–99)
Glucose-Capillary: 185 mg/dL — ABNORMAL HIGH (ref 70–99)
Glucose-Capillary: 163 mg/dL — ABNORMAL HIGH (ref 70–99)

## 2011-08-30 SURGERY — ARTHROPLASTY, KNEE, TOTAL
Anesthesia: General | Site: Knee | Laterality: Left | Wound class: Clean

## 2011-08-30 MED ORDER — HYDROMORPHONE HCL PF 1 MG/ML IJ SOLN
INTRAMUSCULAR | Status: AC
  Filled 2011-08-30: qty 1

## 2011-08-30 MED ORDER — FENTANYL CITRATE 0.05 MG/ML IJ SOLN
INTRAMUSCULAR | Status: DC | PRN
  Administered 2011-08-30 (×2): 50 ug via INTRAVENOUS
  Administered 2011-08-30: 100 ug via INTRAVENOUS

## 2011-08-30 MED ORDER — METHOCARBAMOL 100 MG/ML IJ SOLN
500.0000 mg | Freq: Four times a day (QID) | INTRAVENOUS | Status: DC | PRN
  Filled 2011-08-30: qty 5

## 2011-08-30 MED ORDER — SODIUM CHLORIDE 0.9 % IV SOLN
INTRAVENOUS | Status: DC | PRN
  Administered 2011-08-30: 12:00:00 via INTRAVENOUS

## 2011-08-30 MED ORDER — PROPRANOLOL HCL ER 80 MG PO CP24
80.0000 mg | ORAL_CAPSULE | Freq: Every day | ORAL | Status: DC
  Administered 2011-08-30 – 2011-08-31 (×2): 80 mg via ORAL
  Filled 2011-08-30 (×3): qty 1

## 2011-08-30 MED ORDER — HYDROMORPHONE HCL PF 1 MG/ML IJ SOLN
0.5000 mg | INTRAMUSCULAR | Status: DC | PRN
  Administered 2011-08-30 – 2011-08-31 (×4): 1 mg via INTRAVENOUS
  Filled 2011-08-30: qty 1
  Filled 2011-08-30: qty 2
  Filled 2011-08-30 (×2): qty 1

## 2011-08-30 MED ORDER — GLYCOPYRROLATE 0.2 MG/ML IJ SOLN
INTRAMUSCULAR | Status: DC | PRN
  Administered 2011-08-30: 0.4 mg via INTRAVENOUS
  Administered 2011-08-30: 0.3 mg via INTRAVENOUS

## 2011-08-30 MED ORDER — MIDAZOLAM HCL 2 MG/2ML IJ SOLN
1.0000 mg | INTRAMUSCULAR | Status: DC | PRN
  Administered 2011-08-30: 1 mg via INTRAVENOUS

## 2011-08-30 MED ORDER — HYDROMORPHONE HCL PF 1 MG/ML IJ SOLN
0.2500 mg | INTRAMUSCULAR | Status: DC | PRN
  Administered 2011-08-30 (×4): 0.5 mg via INTRAVENOUS

## 2011-08-30 MED ORDER — VANCOMYCIN HCL IN DEXTROSE 1-5 GM/200ML-% IV SOLN
1000.0000 mg | Freq: Two times a day (BID) | INTRAVENOUS | Status: AC
  Administered 2011-08-31: 1000 mg via INTRAVENOUS
  Filled 2011-08-30: qty 200

## 2011-08-30 MED ORDER — NEOSTIGMINE METHYLSULFATE 1 MG/ML IJ SOLN
INTRAMUSCULAR | Status: DC | PRN
  Administered 2011-08-30: 3 mg via INTRAVENOUS
  Administered 2011-08-30: 2 mg via INTRAVENOUS

## 2011-08-30 MED ORDER — LIDOCAINE HCL (CARDIAC) 20 MG/ML IV SOLN
INTRAVENOUS | Status: DC | PRN
  Administered 2011-08-30: 100 mg via INTRAVENOUS

## 2011-08-30 MED ORDER — DEXAMETHASONE 6 MG PO TABS
10.0000 mg | ORAL_TABLET | Freq: Every day | ORAL | Status: AC
  Administered 2011-08-30 – 2011-09-01 (×3): 10 mg via ORAL
  Filled 2011-08-30 (×3): qty 1

## 2011-08-30 MED ORDER — METHOCARBAMOL 500 MG PO TABS
500.0000 mg | ORAL_TABLET | Freq: Four times a day (QID) | ORAL | Status: DC | PRN
  Administered 2011-08-31 (×2): 500 mg via ORAL
  Filled 2011-08-30 (×2): qty 1

## 2011-08-30 MED ORDER — ONDANSETRON HCL 4 MG/2ML IJ SOLN: 4.0000 mg | Freq: Four times a day (QID) | INTRAMUSCULAR | Status: DC | PRN

## 2011-08-30 MED ORDER — ACETAMINOPHEN 650 MG RE SUPP: 650.0000 mg | Freq: Four times a day (QID) | RECTAL | Status: DC | PRN

## 2011-08-30 MED ORDER — ONDANSETRON HCL 4 MG PO TABS: 4.0000 mg | ORAL_TABLET | Freq: Four times a day (QID) | ORAL | Status: DC | PRN

## 2011-08-30 MED ORDER — BUPIVACAINE-EPINEPHRINE PF 0.5-1:200000 % IJ SOLN
INTRAMUSCULAR | Status: DC | PRN
  Administered 2011-08-30: 30 mL

## 2011-08-30 MED ORDER — ZOLPIDEM TARTRATE 10 MG PO TABS
10.0000 mg | ORAL_TABLET | Freq: Every evening | ORAL | Status: DC | PRN
  Administered 2011-08-30 – 2011-08-31 (×2): 10 mg via ORAL
  Filled 2011-08-30 (×2): qty 1

## 2011-08-30 MED ORDER — FLUTICASONE PROPIONATE 50 MCG/ACT NA SUSP
2.0000 | Freq: Every day | NASAL | Status: DC
  Administered 2011-08-31 – 2011-09-01 (×2): 2 via NASAL
  Filled 2011-08-30: qty 16

## 2011-08-30 MED ORDER — OXYCODONE HCL 5 MG PO TABS
ORAL_TABLET | ORAL | Status: AC
  Filled 2011-08-30: qty 2

## 2011-08-30 MED ORDER — PANTOPRAZOLE SODIUM 40 MG PO TBEC
40.0000 mg | DELAYED_RELEASE_TABLET | Freq: Every day | ORAL | Status: DC
  Administered 2011-08-30 – 2011-09-01 (×3): 40 mg via ORAL
  Filled 2011-08-30 (×3): qty 1

## 2011-08-30 MED ORDER — PROPRANOLOL HCL ER BEADS 80 MG PO CP24: 80.0000 mg | ORAL_CAPSULE | Freq: Every day | ORAL | Status: DC

## 2011-08-30 MED ORDER — TOBRAMYCIN SULFATE 1.2 G IJ SOLR
INTRAMUSCULAR | Status: DC | PRN
  Administered 2011-08-30: 1.2 g

## 2011-08-30 MED ORDER — INSULIN ASPART 100 UNIT/ML ~~LOC~~ SOLN
0.0000 [IU] | Freq: Three times a day (TID) | SUBCUTANEOUS | Status: DC
  Administered 2011-08-30: 3 [IU] via SUBCUTANEOUS
  Administered 2011-08-31: 11 [IU] via SUBCUTANEOUS
  Administered 2011-08-31 – 2011-09-01 (×3): 5 [IU] via SUBCUTANEOUS
  Administered 2011-09-01: 8 [IU] via SUBCUTANEOUS
  Administered 2011-09-01: 2 [IU] via SUBCUTANEOUS

## 2011-08-30 MED ORDER — TOBRAMYCIN SULFATE 1.2 G IJ SOLR
INTRAMUSCULAR | Status: AC
  Filled 2011-08-30: qty 1.2

## 2011-08-30 MED ORDER — PROPOFOL 10 MG/ML IV EMUL
INTRAVENOUS | Status: DC | PRN
  Administered 2011-08-30: 200 mg via INTRAVENOUS

## 2011-08-30 MED ORDER — ACETAMINOPHEN 325 MG PO TABS
650.0000 mg | ORAL_TABLET | Freq: Four times a day (QID) | ORAL | Status: DC | PRN
  Administered 2011-08-30: 650 mg via ORAL
  Filled 2011-08-30: qty 2

## 2011-08-30 MED ORDER — BUPIVACAINE-EPINEPHRINE PF 0.25-1:200000 % IJ SOLN
INTRAMUSCULAR | Status: AC
  Filled 2011-08-30: qty 30

## 2011-08-30 MED ORDER — MEPERIDINE HCL 25 MG/ML IJ SOLN: 6.2500 mg | INTRAMUSCULAR | Status: DC | PRN

## 2011-08-30 MED ORDER — LACTATED RINGERS IV SOLN
INTRAVENOUS | Status: DC | PRN
  Administered 2011-08-30 (×2): via INTRAVENOUS

## 2011-08-30 MED ORDER — DOCUSATE SODIUM 100 MG PO CAPS
100.0000 mg | ORAL_CAPSULE | Freq: Two times a day (BID) | ORAL | Status: DC
  Administered 2011-08-30 – 2011-09-01 (×4): 100 mg via ORAL
  Filled 2011-08-30 (×5): qty 1

## 2011-08-30 MED ORDER — METFORMIN HCL 850 MG PO TABS
850.0000 mg | ORAL_TABLET | Freq: Two times a day (BID) | ORAL | Status: DC
  Administered 2011-09-01: 850 mg via ORAL
  Filled 2011-08-30 (×3): qty 1

## 2011-08-30 MED ORDER — SUCCINYLCHOLINE CHLORIDE 20 MG/ML IJ SOLN
INTRAMUSCULAR | Status: DC | PRN
  Administered 2011-08-30: 100 mg via INTRAVENOUS

## 2011-08-30 MED ORDER — ROCURONIUM BROMIDE 100 MG/10ML IV SOLN
INTRAVENOUS | Status: DC | PRN
  Administered 2011-08-30: 10 mg via INTRAVENOUS
  Administered 2011-08-30: 30 mg via INTRAVENOUS
  Administered 2011-08-30: 5 mg via INTRAVENOUS

## 2011-08-30 MED ORDER — INSULIN ASPART 100 UNIT/ML ~~LOC~~ SOLN
0.0000 [IU] | Freq: Every day | SUBCUTANEOUS | Status: DC
  Administered 2011-08-31: 5 [IU] via SUBCUTANEOUS

## 2011-08-30 MED ORDER — BISACODYL 5 MG PO TBEC
10.0000 mg | DELAYED_RELEASE_TABLET | Freq: Every day | ORAL | Status: DC
  Administered 2011-08-30 – 2011-08-31 (×2): 10 mg via ORAL
  Filled 2011-08-30 (×2): qty 2

## 2011-08-30 MED ORDER — ENOXAPARIN SODIUM 30 MG/0.3ML ~~LOC~~ SOLN
30.0000 mg | Freq: Two times a day (BID) | SUBCUTANEOUS | Status: DC
  Administered 2011-08-31 – 2011-09-01 (×3): 30 mg via SUBCUTANEOUS
  Filled 2011-08-30 (×5): qty 0.3

## 2011-08-30 MED ORDER — POLYVINYL ALCOHOL 1.4 % OP SOLN
2.0000 [drp] | OPHTHALMIC | Status: DC | PRN
  Filled 2011-08-30: qty 15

## 2011-08-30 MED ORDER — MIDAZOLAM HCL 2 MG/2ML IJ SOLN
INTRAMUSCULAR | Status: AC
  Filled 2011-08-30: qty 2

## 2011-08-30 MED ORDER — DEXAMETHASONE SODIUM PHOSPHATE 10 MG/ML IJ SOLN
10.0000 mg | Freq: Every day | INTRAMUSCULAR | Status: AC
  Filled 2011-08-30 (×3): qty 1

## 2011-08-30 MED ORDER — EPHEDRINE SULFATE 50 MG/ML IJ SOLN
INTRAMUSCULAR | Status: DC | PRN
  Administered 2011-08-30: 10 mg via INTRAVENOUS

## 2011-08-30 MED ORDER — SODIUM CHLORIDE 0.9 % IR SOLN
Status: DC | PRN
  Administered 2011-08-30: 3000 mL

## 2011-08-30 MED ORDER — VENLAFAXINE HCL ER 75 MG PO CP24
75.0000 mg | ORAL_CAPSULE | Freq: Every day | ORAL | Status: DC
  Administered 2011-08-31 – 2011-09-01 (×2): 75 mg via ORAL
  Filled 2011-08-30 (×4): qty 1

## 2011-08-30 MED ORDER — FENTANYL CITRATE 0.05 MG/ML IJ SOLN
INTRAMUSCULAR | Status: AC
  Filled 2011-08-30: qty 2

## 2011-08-30 MED ORDER — METOCLOPRAMIDE HCL 10 MG PO TABS: 5.0000 mg | ORAL_TABLET | Freq: Three times a day (TID) | ORAL | Status: DC | PRN

## 2011-08-30 MED ORDER — KETOROLAC TROMETHAMINE 30 MG/ML IJ SOLN: 15.0000 mg | Freq: Once | INTRAMUSCULAR | Status: DC | PRN

## 2011-08-30 MED ORDER — PROMETHAZINE HCL 25 MG/ML IJ SOLN: 6.2500 mg | INTRAMUSCULAR | Status: DC | PRN

## 2011-08-30 MED ORDER — FENTANYL CITRATE 0.05 MG/ML IJ SOLN
50.0000 ug | INTRAMUSCULAR | Status: DC | PRN
  Administered 2011-08-30: 50 ug via INTRAVENOUS

## 2011-08-30 MED ORDER — PHENOL 1.4 % MT LIQD: 1.0000 | OROMUCOSAL | Status: DC | PRN

## 2011-08-30 MED ORDER — OXYCODONE HCL 5 MG PO TABS
5.0000 mg | ORAL_TABLET | ORAL | Status: DC | PRN
  Administered 2011-08-30 – 2011-08-31 (×6): 10 mg via ORAL
  Administered 2011-08-31: 5 mg via ORAL
  Administered 2011-09-01 (×3): 10 mg via ORAL
  Filled 2011-08-30 (×10): qty 2

## 2011-08-30 MED ORDER — SIMVASTATIN 20 MG PO TABS
20.0000 mg | ORAL_TABLET | Freq: Every day | ORAL | Status: DC
  Administered 2011-08-30 – 2011-08-31 (×2): 20 mg via ORAL
  Filled 2011-08-30 (×3): qty 1

## 2011-08-30 MED ORDER — MENTHOL 3 MG MT LOZG: 1.0000 | LOZENGE | OROMUCOSAL | Status: DC | PRN

## 2011-08-30 MED ORDER — BUPIVACAINE-EPINEPHRINE 0.25% -1:200000 IJ SOLN
INTRAMUSCULAR | Status: DC | PRN
  Administered 2011-08-30: 30 mL

## 2011-08-30 MED ORDER — METHOCARBAMOL 100 MG/ML IJ SOLN
500.0000 mg | INTRAVENOUS | Status: AC
  Administered 2011-08-30: 500 mg via INTRAVENOUS
  Filled 2011-08-30: qty 5

## 2011-08-30 MED ORDER — LORATADINE 10 MG PO TABS
10.0000 mg | ORAL_TABLET | Freq: Every day | ORAL | Status: DC
  Administered 2011-08-30 – 2011-09-01 (×3): 10 mg via ORAL
  Filled 2011-08-30 (×3): qty 1

## 2011-08-30 MED ORDER — POTASSIUM CHLORIDE IN NACL 20-0.9 MEQ/L-% IV SOLN
INTRAVENOUS | Status: DC
  Administered 2011-08-30: 21:00:00 via INTRAVENOUS
  Filled 2011-08-30 (×7): qty 1000

## 2011-08-30 MED ORDER — ONDANSETRON HCL 4 MG/2ML IJ SOLN
INTRAMUSCULAR | Status: DC | PRN
  Administered 2011-08-30: 4 mg via INTRAVENOUS

## 2011-08-30 MED ORDER — PHENYLEPHRINE HCL 10 MG/ML IJ SOLN
10.0000 mg | INTRAVENOUS | Status: DC | PRN
  Administered 2011-08-30: 10 ug/min via INTRAVENOUS

## 2011-08-30 MED ORDER — METOCLOPRAMIDE HCL 5 MG/ML IJ SOLN: 5.0000 mg | Freq: Three times a day (TID) | INTRAMUSCULAR | Status: DC | PRN

## 2011-08-30 SURGICAL SUPPLY — 70 items
BANDAGE ESMARK 6X9 LF (GAUZE/BANDAGES/DRESSINGS) ×1 IMPLANT
BLADE SAGITTAL 25.0X1.19X90 (BLADE) ×2 IMPLANT
BLADE SAW SGTL 11.0X1.19X90.0M (BLADE) IMPLANT
BLADE SAW SGTL 13.0X1.19X90.0M (BLADE) ×2 IMPLANT
BLADE SURG 10 STRL SS (BLADE) ×4 IMPLANT
BNDG CMPR 9X6 STRL LF SNTH (GAUZE/BANDAGES/DRESSINGS) ×1
BNDG CMPR MED 15X6 ELC VLCR LF (GAUZE/BANDAGES/DRESSINGS) ×1
BNDG ELASTIC 6X15 VLCR STRL LF (GAUZE/BANDAGES/DRESSINGS) ×2 IMPLANT
BNDG ESMARK 6X9 LF (GAUZE/BANDAGES/DRESSINGS) ×2
BOWL SMART MIX CTS (DISPOSABLE) ×2 IMPLANT
CEMENT HV SMART SET (Cement) ×4 IMPLANT
CLOTH BEACON ORANGE TIMEOUT ST (SAFETY) ×2 IMPLANT
COVER BACK TABLE 24X17X13 BIG (DRAPES) IMPLANT
COVER PROBE W GEL 5X96 (DRAPES) ×2 IMPLANT
COVER SURGICAL LIGHT HANDLE (MISCELLANEOUS) ×2 IMPLANT
CUFF TOURNIQUET SINGLE 34IN LL (TOURNIQUET CUFF) ×2 IMPLANT
CUFF TOURNIQUET SINGLE 44IN (TOURNIQUET CUFF) IMPLANT
DRAPE EXTREMITY T 121X128X90 (DRAPE) ×2 IMPLANT
DRAPE INCISE IOBAN 66X45 STRL (DRAPES) ×1 IMPLANT
DRAPE PROXIMA HALF (DRAPES) ×2 IMPLANT
DRAPE U-SHAPE 47X51 STRL (DRAPES) ×2 IMPLANT
DRSG ADAPTIC 3X8 NADH LF (GAUZE/BANDAGES/DRESSINGS) ×2 IMPLANT
DRSG PAD ABDOMINAL 8X10 ST (GAUZE/BANDAGES/DRESSINGS) ×4 IMPLANT
DURAPREP 26ML APPLICATOR (WOUND CARE) ×2 IMPLANT
ELECT CAUTERY BLADE 6.4 (BLADE) ×2 IMPLANT
ELECT REM PT RETURN 9FT ADLT (ELECTROSURGICAL) ×2
ELECTRODE REM PT RTRN 9FT ADLT (ELECTROSURGICAL) ×1 IMPLANT
EVACUATOR 1/8 PVC DRAIN (DRAIN) ×1 IMPLANT
FACESHIELD LNG OPTICON STERILE (SAFETY) ×2 IMPLANT
GLOVE BIO SURGEON STRL SZ7 (GLOVE) ×2 IMPLANT
GLOVE BIOGEL PI IND STRL 7.0 (GLOVE) ×1 IMPLANT
GLOVE BIOGEL PI IND STRL 7.5 (GLOVE) ×1 IMPLANT
GLOVE BIOGEL PI INDICATOR 7.0 (GLOVE) ×1
GLOVE BIOGEL PI INDICATOR 7.5 (GLOVE) ×1
GLOVE SS BIOGEL STRL SZ 7.5 (GLOVE) ×1 IMPLANT
GLOVE SUPERSENSE BIOGEL SZ 7.5 (GLOVE) ×1
GOWN PREVENTION PLUS XLARGE (GOWN DISPOSABLE) ×4 IMPLANT
GOWN STRL NON-REIN LRG LVL3 (GOWN DISPOSABLE) ×4 IMPLANT
HANDPIECE INTERPULSE COAX TIP (DISPOSABLE) ×2
HOOD PEEL AWAY FACE SHEILD DIS (HOOD) ×5 IMPLANT
IMMOBILIZER KNEE 22 UNIV (SOFTGOODS) IMPLANT
INSERT CUSHION PRONEVIEW LG (MISCELLANEOUS) ×2 IMPLANT
KIT BASIN OR (CUSTOM PROCEDURE TRAY) ×2 IMPLANT
KIT ROOM TURNOVER OR (KITS) ×2 IMPLANT
MANIFOLD NEPTUNE II (INSTRUMENTS) ×2 IMPLANT
NS IRRIG 1000ML POUR BTL (IV SOLUTION) ×2 IMPLANT
PACK TOTAL JOINT (CUSTOM PROCEDURE TRAY) ×2 IMPLANT
PAD ARMBOARD 7.5X6 YLW CONV (MISCELLANEOUS) ×4 IMPLANT
PAD CAST 4YDX4 CTTN HI CHSV (CAST SUPPLIES) ×1 IMPLANT
PADDING CAST COTTON 4X4 STRL (CAST SUPPLIES) ×2
PADDING CAST COTTON 6X4 STRL (CAST SUPPLIES) ×2 IMPLANT
POSITIONER HEAD PRONE TRACH (MISCELLANEOUS) ×2 IMPLANT
RUBBERBAND STERILE (MISCELLANEOUS) ×2 IMPLANT
SET HNDPC FAN SPRY TIP SCT (DISPOSABLE) ×1 IMPLANT
SPONGE GAUZE 4X4 12PLY (GAUZE/BANDAGES/DRESSINGS) ×2 IMPLANT
STRIP CLOSURE SKIN 1/2X4 (GAUZE/BANDAGES/DRESSINGS) ×2 IMPLANT
SUCTION FRAZIER TIP 10 FR DISP (SUCTIONS) ×2 IMPLANT
SUT FIBERWIRE 2-0 18 17.9 3/8 (SUTURE) ×2
SUT MNCRL AB 3-0 PS2 18 (SUTURE) ×2 IMPLANT
SUT VIC AB 0 CT1 27 (SUTURE) ×4
SUT VIC AB 0 CT1 27XBRD ANBCTR (SUTURE) ×2 IMPLANT
SUT VIC AB 2-0 CT1 27 (SUTURE) ×4
SUT VIC AB 2-0 CT1 TAPERPNT 27 (SUTURE) ×2 IMPLANT
SUT VLOC 180 0 24IN GS25 (SUTURE) ×2 IMPLANT
SUTURE FIBERWR 2-0 18 17.9 3/8 (SUTURE) IMPLANT
SYR 30ML SLIP (SYRINGE) ×2 IMPLANT
TOWEL OR 17X24 6PK STRL BLUE (TOWEL DISPOSABLE) ×2 IMPLANT
TOWEL OR 17X26 10 PK STRL BLUE (TOWEL DISPOSABLE) ×2 IMPLANT
TRAY FOLEY CATH 14FR (SET/KITS/TRAYS/PACK) ×2 IMPLANT
WATER STERILE IRR 1000ML POUR (IV SOLUTION) ×6 IMPLANT

## 2011-08-30 NOTE — Anesthesia Procedure Notes (Addendum)
Anesthesia Regional Block:  Femoral nerve block  Pre-Anesthetic Checklist: ,, timeout performed, Correct Patient, Correct Site, Correct Laterality, Correct Procedure, Correct Position, site marked, Risks and benefits discussed, at surgeon's request and post-op pain management  Laterality: Left and Upper  Prep: Betadine       Needles:  Injection technique: Single-shot  Needle Type: Stimulator Needle - 80      Needle Gauge: 22 and 22 G  Needle insertion depth: 4 cm   Additional Needles:  Procedures: nerve stimulator Femoral nerve block  Nerve Stimulator or Paresthesia:  Response: Twitch elicited, 0.8 mA,   Additional Responses:   Narrative:  Start time: 08/30/2011 11:00 AM End time: 08/30/2011 11:15 AM Injection made incrementally with aspirations every 5 mL.  Performed by: Personally  Anesthesiologist: Alma Friendly, MD  Additional Notes: BP cuff, EKG monitors applied. Sedation begun. Femoral artery palpated for location of nerve. After nerve location anesthetic injected incrementally, slowly , and after neg aspirations. Tolerated well.  Femoral nerve block Procedure Name: Intubation Date/Time: 08/30/2011 12:29 PM Performed by: Neomia Dear, Coolidge Gossard K Pre-anesthesia Checklist: Emergency Drugs available, Patient identified, Timeout performed, Suction available and Patient being monitored Patient Re-evaluated:Patient Re-evaluated prior to inductionOxygen Delivery Method: Circle system utilized Preoxygenation: Pre-oxygenation with 100% oxygen Intubation Type: IV induction Ventilation: Mask ventilation without difficulty Tube type: Oral Tube size: 7.5 mm Number of attempts: 1 Airway Equipment and Method: Stylet and Video-laryngoscopy Placement Confirmation: ETT inserted through vocal cords under direct vision,  breath sounds checked- equal and bilateral and positive ETCO2 Secured at: 22 cm Tube secured with: Tape Dental Injury: Teeth and Oropharynx as per pre-operative  assessment

## 2011-08-30 NOTE — H&P (View-Only) (Signed)
Heather Haley is an 51 y.o. female.   Chief Complaint: left knee DJD HPI: Heather Haley is a 51 year old seen for follow-up from degenerative joint disease and acute exacerbation of her left knee secondary to a work related injury on 08/29/10 when she fell injuring her left knee. She's had significant pain since the fall. She had a MRI that showed tricompartmental arthritis rather severe with small meniscal tears and loose body. She's had multiple ankle surgeries the last done in 2009 by Dr. Bednarz for ligamentous reconstruction. She uses an AFO because of this.  Past Medical History  Diagnosis Date  . Hypertension   . IBS (irritable bowel syndrome)   . Postoperative nausea and vomiting   . Diabetes mellitus   . Left ankle instability   . Headache   . Left knee DJD   . Depression     Past Surgical History  Procedure Date  . Ankle surgery   . Abdominal hysterectomy   . Knee arthroscopy 1997    left knee    Family History  Problem Relation Age of Onset  . Cancer Mother   . Hypertension Father   . Heart disease Father    Social History:  reports that she quit smoking about 16 years ago. She has never used smokeless tobacco. She reports that she drinks alcohol. She reports that she does not use illicit drugs.  Allergies:  Allergies  Allergen Reactions  . Codeine     REACTION: hives  . Nsaids     REACTION: palpitations, diaphoresis  . Penicillins     REACTION: upset stomach   Current Outpatient Prescriptions on File Prior to Visit  Medication Sig Dispense Refill  . acetaminophen (TYLENOL) 500 MG tablet Take 500 mg by mouth 3 (three) times daily. Taken with tramadol      . cetirizine (ZYRTEC) 10 MG tablet Take 10 mg by mouth at bedtime.      . desvenlafaxine (PRISTIQ) 50 MG 24 hr tablet Take 50 mg by mouth daily.       . docusate sodium (COLACE) 100 MG capsule Take 200 mg by mouth 2 (two) times daily as needed. 200 mg every morning and if needed 200 mg in the evening       . fluticasone (FLONASE) 50 MCG/ACT nasal spray Place 2 sprays into the nose daily.       . HYDROcodone-acetaminophen (VICODIN) 5-500 MG per tablet Take 1 tablet by mouth 2 (two) times daily as needed. For pain      . Hypromellose (ARTIFICIAL TEARS OP) Apply 2 drops to eye 4 (four) times daily as needed. Dry eye      . metFORMIN (GLUCOPHAGE) 850 MG tablet Take 850 mg by mouth 2 (two) times daily with a meal.      . omeprazole (PRILOSEC) 20 MG capsule Take 20 mg by mouth daily.      . pravastatin (PRAVACHOL) 40 MG tablet Take 40 mg by mouth daily.      . promethazine (PHENERGAN) 25 MG tablet Take 25 mg by mouth every 6 (six) hours as needed. For nausea      . propranolol (INNOPRAN XL) 80 MG 24 hr capsule Take 80 mg by mouth at bedtime.       . traMADol (ULTRAM) 50 MG tablet Take 100 mg by mouth 3 (three) times daily.      . zolpidem (AMBIEN) 10 MG tablet Take 10 mg by mouth at bedtime as needed. For sleep        (  Not in a hospital admission)  No results found for this or any previous visit (from the past 48 hour(s)). No results found.  Review of Systems  Constitutional: Negative.   HENT: Negative for hearing loss, ear pain, nosebleeds, congestion, sore throat, tinnitus and ear discharge.   Eyes: Positive for blurred vision (recently poked in the eye) and pain.  Respiratory: Negative.  Negative for stridor.   Cardiovascular: Negative.   Gastrointestinal: Positive for diarrhea and constipation.  Genitourinary: Positive for frequency.  Musculoskeletal: Positive for joint pain.       Left knee  Skin: Positive for itching and rash.  Neurological: Positive for headaches.  Endo/Heme/Allergies: Negative.   Psychiatric/Behavioral: Negative.     Blood pressure 145/92, pulse 70, temperature 98 F (36.7 C), height 5' 5" (1.651 m), weight 126.1 kg (278 lb), SpO2 95.00%. Physical Exam  Constitutional: She appears well-developed and well-nourished.  HENT:  Head: Normocephalic and atraumatic.    Mouth/Throat: Oropharynx is clear and moist.  Eyes: Conjunctivae and EOM are normal. Pupils are equal, round, and reactive to light.  Neck: Normal range of motion.  Cardiovascular: Normal rate and regular rhythm.   Respiratory: Effort normal and breath sounds normal.  GI: Bowel sounds are normal.  Genitourinary:       Not pertinent to current symptomatology therefore not examined.  Musculoskeletal:       Examination of her left knee reveals pain medially and laterally. 1 to 2+ crepitation, 1+ synovitis, range of motion -5 to 125 degrees varus deformity is noted, knee is stable with normal patella tracking. Exam of her right knee reveals 1+ crepitation 1+ synovitis, range of motion 0-125 degrees knee is stable with normal patella tracking. Vascular exam: pulses 2+ and symmetric.  Neurological: She is alert.  Skin: Skin is warm and dry.  Psychiatric: She has a normal mood and affect. Her behavior is normal. Judgment and thought content normal.    X-RAYS: Left knee standing AP lateral flexion and sunrise view and a new long standing x-ray shows significant degenerative joint disease with mild varus deformity. Periarticular spurring and sub chondral sclerosis  Assessment Patient Active Problem List  Diagnosis  . ANKLE PAIN, LEFT  . OTHER ENTHESOPATHY OF ANKLE AND TARSUS  . FOOT PAIN, LEFT  . ADVERSE DRUG REACTION  . Left knee pain  . Hypertension  . IBS (irritable bowel syndrome)  . Postoperative nausea and vomiting  . Left ankle instability  . Left knee DJD  . Depression    Plan I talk to her about this in detail. Would recommend with her significant persistent pain lack of response to conservative care that we proceed with left total knee replacement. Discussed risks benefits and possible complications of the surgery in detail and she understands this completely. She will be placed in a SNF post-op because she lives alone. We'll plan on setting her up for this at some point in the  near future. Going to [facility].  Cleared by Dr Hank Smith  Heather Haley 08/17/2011, 5:14 PM    

## 2011-08-30 NOTE — Interval H&P Note (Signed)
History and Physical Interval Note:  08/30/2011 12:09 PM  Heather Haley  has presented today for surgery, with the diagnosis of DJD LEFT KNEE  The various methods of treatment have been discussed with the patient and family. After consideration of risks, benefits and other options for treatment, the patient has consented to  Procedure(s) (LRB): TOTAL KNEE ARTHROPLASTY (Left) as a surgical intervention .  The patient's history has been reviewed, patient examined, no change in status, stable for surgery.  I have reviewed the patient's chart and labs.  Questions were answered to the patient's satisfaction.     Salvatore Marvel A

## 2011-08-30 NOTE — Op Note (Signed)
MRN:     [id number] DOB/AGE:    52/21/1962 / 52 y.o.       OPERATIVE REPORT    DATE OF PROCEDURE:  08/30/2011       PREOPERATIVE DIAGNOSIS:   DJD LEFT KNEE      Estimated Body mass index is 43.27 kg/(m^2) as calculated from the following:   Height as of 10/19/10: 5\' 5" (1.651 m).   Weight as of 10/19/10: 260 lb(117.935 kg).                                                        POSTOPERATIVE DIAGNOSIS:   degenerative joint disease left knee                                                                      PROCEDURE:  Procedure(s): TOTAL KNEE ARTHROPLASTY Using Depuy Sigma RP implants #2.5 Femur, #2.5Tibia, 10mm sigma RP bearing, 32 Patella     SURGEON: Danny Yackley A    ASSISTANT:  Kirstin Shepperson PA-C   (Present and scrubbed throughout the case, critical for assistance with exposure, retraction, instrumentation, and closure.)         ANESTHESIA: GET with Femoral Nerve Block  DRAINS: foley, 2 medium hemovac in knee   TOURNIQUET TIME:   COMPLICATIONS:  None     SPECIMENS: None   INDICATIONS FOR PROCEDURE: The patient has  DJD LEFT KNEE, varus deformities, XR shows bone on bone arthritis. Patient has failed all conservative measures including anti-inflammatory medicines, narcotics, attempts at  exercise and weight loss, cortisone injections and viscosupplementation.  Risks and benefits of surgery have been discussed, questions answered.   DESCRIPTION OF PROCEDURE: The patient identified by armband, received  right femoral nerve block and IV antibiotics, in the holding area at [facility]. Patient taken to the operating room, appropriate anesthetic  monitors were attached General endotracheal anesthesia induced with  the patient in supine position, Foley catheter was inserted. Tourniquet  applied high to the operative thigh. Lateral post and foot positioner  applied to the table, the lower extremity was then prepped and draped  in usual sterile fashion from the  ankle to the tourniquet. Time-out procedure was performed. The limb was wrapped with an Esmarch bandage and the tourniquet inflated to 365 mmHg. We began the operation by making the anterior midline incision starting at handbreadth above the patella going over the patella 1 cm medial to and  4 cm distal to the tibial tubercle. Small bleeders in the skin and the  subcutaneous tissue identified and cauterized. Transverse retinaculum was incised and reflected medially and a medial parapatellar arthrotomy was accomplished. the patella was everted and theprepatellar fat pad resected. The superficial medial collateral  ligament was then elevated from anterior to posterior along the proximal  flare of the tibia and anterior half of the menisci resected. The knee was hyperflexed exposing bone on bone arthritis. Peripheral and notch osteophytes as well as the cruciate ligaments were then resected. We continued to  work our way around posteriorly along the proximal tibia, and externally  rotated  the tibia subluxing it out from underneath the femur. A McHale  retractor was placed through the notch and a lateral Hohmann retractor  placed, and we then drilled through the proximal tibia in line with the  axis of the tibia followed by an intramedullary guide rod and 2-degree  posterior slope cutting guide. The tibial cutting guide was pinned into place  allowing resection of 4 mm of bone medially and about 7 mm of bone  laterally because of her varus deformity. Satisfied with the tibial resection, we then  entered the distal femur 2 mm anterior to the PCL origin with the  intramedullary guide rod and applied the distal femoral cutting guide  set at 11mm, with 5 degrees of valgus. This was pinned along the  epicondylar axis. At this point, the distal femoral cut was accomplished without difficulty. We then sized for a #2.5 femoral component and pinned the guide in 3 degrees of external rotation.The chamfer cutting  guide was pinned into place. The anterior, posterior, and chamfer cuts were accomplished without difficulty followed by  the Sigma RP box cutting guide and the box cut. We also removed posterior osteophytes from the posterior femoral condyles. At this  time, the knee was brought into full extension. We checked our  extension and flexion gaps and found them symmetric at 10mm.  The patella thickness measured at 22 mm. We set the cutting guide at 14 and removed the posterior 8 mm  of the patella sized for 32 button and drilled the lollipop. The knee  was then once again hyperflexed exposing the proximal tibia. We sized for a #2.5 tibial base plate, applied the smokestack and the conical reamer followed by the the Delta fin keel punch. We then hammered into place the Sigma RP trial femoral component, inserted a 10-mm trial bearing, trial patellar button, and took the knee through range of motion from 0-130 degrees. No thumb pressure was required for patellar  tracking. At this point, all trial components were removed, a double batch of DePuy HV cement with 1500 mg of Zinacef was mixed and applied to all bony metallic mating surfaces except for the posterior condyles of the femur itself. In order, we  hammered into place the tibial tray and removed excess cement, the femoral component and removed excess cement, a 10-mm Sigma RP bearing  was inserted, and the knee brought to full extension with compression.  The patellar button was clamped into place, and excess cement  removed. While the cement cured the wound was irrigated out with normal saline solution pulse lavage, and medium Hemovac drains were placed.. Ligament stability and patellar tracking were checked and found to be excellent. The tourniquet was then released and hemostasis was obtained with cautery. The parapatellar arthrotomy was closed with  Z lock suture. The subcutaneous tissue with 0 and 2-0 undyed  Vicryl suture, and 4-0 Monocryl.. A  dressing of Xeroform,  4 x 4, dressing sponges, Webril, and Ace wrap applied. Needle and sponge count were correct times 2.The patient awakened, extubated, and taken to recovery room without difficulty. Vascular status was normal, pulses 2+ and symmetric.   Heather Haley A 08/30/2011, 2:02 PM

## 2011-08-30 NOTE — Anesthesia Preprocedure Evaluation (Addendum)
Anesthesia Evaluation  Patient identified by MRN, date of birth, ID band Patient awake    Reviewed: Allergy & Precautions, H&P , NPO status , Patient's Chart, lab work & pertinent test results, reviewed documented beta blocker date and time   History of Anesthesia Complications (+) PONV  Airway Mallampati: II  Neck ROM: Full  Mouth opening: Limited Mouth Opening  Dental  (+) Teeth Intact   Pulmonary former smoker,  breath sounds clear to auscultation        Cardiovascular hypertension, Pt. on medications and Pt. on home beta blockers Rhythm:Regular Rate:Normal     Neuro/Psych  Headaches, PSYCHIATRIC DISORDERS Depression    GI/Hepatic GERD-  Medicated,  Endo/Other  Type 2, Oral Hypoglycemic AgentsMorbid obesity  Renal/GU      Musculoskeletal  (+) Arthritis -, Osteoarthritis,    Abdominal (+) + obese,   Peds  Hematology negative hematology ROS (+)   Anesthesia Other Findings   Reproductive/Obstetrics                          Anesthesia Physical Anesthesia Plan  ASA: III  Anesthesia Plan: General   Post-op Pain Management: MAC Combined w/ Regional for Post-op pain   Induction: Intravenous  Airway Management Planned: Oral ETT  Additional Equipment:   Intra-op Plan:   Post-operative Plan: Extubation in OR  Informed Consent: I have reviewed the patients History and Physical, chart, labs and discussed the procedure including the risks, benefits and alternatives for the proposed anesthesia with the patient or authorized representative who has indicated his/her understanding and acceptance.   Dental advisory given  Plan Discussed with: Anesthesiologist and Surgeon  Anesthesia Plan Comments:         Anesthesia Quick Evaluation

## 2011-08-30 NOTE — Anesthesia Postprocedure Evaluation (Signed)
  Anesthesia Post-op Note  Patient: Heather Haley  Procedure(s) Performed: Procedure(s) (LRB): TOTAL KNEE ARTHROPLASTY (Left)  Patient Location: PACU  Anesthesia Type: General  Level of Consciousness: awake and alert   Airway and Oxygen Therapy: Patient Spontanous Breathing  Post-op Pain: mild  Post-op Assessment: Post-op Vital signs reviewed, Patient's Cardiovascular Status Stable, Respiratory Function Stable, Patent Airway and No signs of Nausea or vomiting  Post-op Vital Signs: stable  Complications: No apparent anesthesia complications

## 2011-08-30 NOTE — Progress Notes (Signed)
Orthopedic Tech Progress Note Patient Details:  Heather Haley Sep 10, 1960 [id number] Applied overhead frame.  CPM Left Knee CPM Left Knee: On Left Knee Flexion (Degrees): 60  Left Knee Extension (Degrees): 0    [name] 08/30/2011, 4:27 PM

## 2011-08-30 NOTE — Transfer of Care (Signed)
Immediate Anesthesia Transfer of Care Note  Patient: Heather Haley  Procedure(s) Performed: Procedure(s) (LRB): TOTAL KNEE ARTHROPLASTY (Left)  Patient Location: PACU  Anesthesia Type: GA combined with regional for post-op pain  Level of Consciousness: awake, alert  and oriented  Airway & Oxygen Therapy: Patient Spontanous Breathing and Patient connected to nasal cannula oxygen  Post-op Assessment: Report given to PACU RN, Post -op Vital signs reviewed and stable and Patient moving all extremities  Post vital signs: Reviewed and stable  Complications: No apparent anesthesia complications

## 2011-08-30 NOTE — Preoperative (Signed)
Beta Blockers   Reason not to administer Beta Blockers:Not Applicable 

## 2011-08-31 ENCOUNTER — Encounter (HOSPITAL_COMMUNITY): Payer: Self-pay | Admitting: Orthopedic Surgery

## 2011-08-31 ENCOUNTER — Inpatient Hospital Stay (HOSPITAL_COMMUNITY): Payer: Worker's Compensation

## 2011-08-31 LAB — CBC
HCT: 36 % (ref 36.0–46.0)
Hemoglobin: 11.7 g/dL — ABNORMAL LOW (ref 12.0–15.0)
MCH: 29 pg (ref 26.0–34.0)
MCV: 89.1 fL (ref 78.0–100.0)
Platelets: 230 10*3/uL (ref 150–400)
RBC: 4.04 MIL/uL (ref 3.87–5.11)
WBC: 9.8 10*3/uL (ref 4.0–10.5)

## 2011-08-31 LAB — BASIC METABOLIC PANEL
CO2: 25 mEq/L (ref 19–32)
Calcium: 8.6 mg/dL (ref 8.4–10.5)
Chloride: 99 mEq/L (ref 96–112)
Creatinine, Ser: 0.76 mg/dL (ref 0.50–1.10)
Glucose, Bld: 237 mg/dL — ABNORMAL HIGH (ref 70–99)

## 2011-08-31 LAB — GLUCOSE, CAPILLARY
Glucose-Capillary: 235 mg/dL — ABNORMAL HIGH (ref 70–99)
Glucose-Capillary: 238 mg/dL — ABNORMAL HIGH (ref 70–99)

## 2011-08-31 MED ORDER — PNEUMOCOCCAL VAC POLYVALENT 25 MCG/0.5ML IJ INJ
0.5000 mL | INJECTION | INTRAMUSCULAR | Status: AC
  Administered 2011-09-01: 0.5 mL via INTRAMUSCULAR
  Filled 2011-08-31 (×2): qty 0.5

## 2011-08-31 MED ORDER — DIPHENHYDRAMINE HCL 50 MG/ML IJ SOLN
INTRAMUSCULAR | Status: AC
  Filled 2011-08-31: qty 1

## 2011-08-31 MED ORDER — SODIUM CHLORIDE 0.9 % IV BOLUS (SEPSIS)
500.0000 mL | Freq: Once | INTRAVENOUS | Status: AC
  Administered 2011-08-31: 14:00:00 via INTRAVENOUS

## 2011-08-31 MED ORDER — DIPHENHYDRAMINE HCL 50 MG/ML IJ SOLN
25.0000 mg | Freq: Four times a day (QID) | INTRAMUSCULAR | Status: DC | PRN
  Administered 2011-08-31 – 2011-09-01 (×4): 25 mg via INTRAVENOUS
  Filled 2011-08-31 (×3): qty 1

## 2011-08-31 MED ORDER — SODIUM CHLORIDE 0.9 % IV BOLUS (SEPSIS)
500.0000 mL | Freq: Once | INTRAVENOUS | Status: AC
  Administered 2011-08-31: 500 mL via INTRAVENOUS

## 2011-08-31 NOTE — Progress Notes (Signed)
Physical Therapy Treatment Patient Details Name: ERIENNE SPELMAN MRN: [id number] DOB: 1960/05/06 Today's Date: 08/31/2011 Time: 7829-5621 PT Time Calculation (min): 24 min  PT Assessment / Plan / Recommendation Comments on Treatment Session  Pt able to tolerate HEP this PM.  Pt continues to resists knee flexion ROM initially but able to improve throughout heel slides.  Pt left on CPM 0-60 degrees.    Follow Up Recommendations  Skilled nursing facility    Barriers to Discharge        Equipment Recommendations  None recommended by PT    Recommendations for Other Services    Frequency 7X/week   Plan Discharge plan remains appropriate;Frequency remains appropriate    Precautions / Restrictions Precautions Precautions: Knee Required Braces or Orthoses: Knee Immobilizer - Left Knee Immobilizer - Left: Discontinue post op day 2 Restrictions LLE Weight Bearing: Weight bearing as tolerated   Pertinent Vitals/Pain 5/10 left knee pain    Mobility       Exercises Total Joint Exercises Ankle Circles/Pumps: Both;10 reps Quad Sets: Strengthening;Left;AAROM;10 reps Short Arc Quad: Strengthening;Left;10 reps Heel Slides: AAROM;Left;10 reps Hip ABduction/ADduction: Strengthening;Left;10 reps Straight Leg Raises: Strengthening;Left;10 reps   PT Diagnosis:    PT Problem List:   PT Treatment Interventions:     PT Goals Acute Rehab PT Goals PT Goal Formulation: With patient Time For Goal Achievement: 09/07/11 Potential to Achieve Goals: Good Pt will Perform Home Exercise Program: with supervision, verbal cues required/provided PT Goal: Perform Home Exercise Program - Progress: Progressing toward goal  Visit Information  Last PT Received On: 08/31/11 Assistance Needed: +1    Subjective Data  Subjective: "I'm feeling better this afternoon.   Cognition  Overall Cognitive Status: Appears within functional limits for tasks assessed/performed Arousal/Alertness:  Awake/alert Orientation Level: Appears intact for tasks assessed Behavior During Session: [facility] for tasks performed    Balance     End of Session PT - End of Session Equipment Utilized During Treatment: Gait belt;Left knee immobilizer Activity Tolerance: Patient tolerated treatment well Patient left: in bed;with call bell/phone within reach;in CPM Nurse Communication: Mobility status   GP     Jaivian Battaglini 08/31/2011, 1:54 PM Jake Shark, PT DPT [phone]

## 2011-08-31 NOTE — Progress Notes (Signed)
Inpatient Diabetes Program Recommendations  AACE/ADA: New Consensus Statement on Inpatient Glycemic Control (2009)  Target Ranges:  Prepandial:   less than 140 mg/dL      Peak postprandial:   less than 180 mg/dL (1-2 hours)      Critically ill patients:  140 - 180 mg/dL    Results for KHELANI, KOPS (MRN [id number]) as of 08/31/2011 14:26  Ref. Range 08/31/2011 07:04 08/31/2011 11:40  Glucose-Capillary Latest Range: 70-99 mg/dL 409 (H) 811 (H)     Inpatient Diabetes Program Recommendations Insulin - Basal: Please consider adding Lantus 10 units QHS if patient continues to have early AM hyperglycemia while here in hospital.  Note: Will follow. Ambrose Finland RN, MSN, CDE Diabetes Coordinator Inpatient Diabetes Program [phone]

## 2011-08-31 NOTE — Progress Notes (Signed)
Patient ID: Heather Haley, female   DOB: 11-20-1960, 51 y.o.   MRN: [id number] PATIENT ID: Heather Haley  MRN: [id number]  DOB/AGE:  August 01, 1960 / 51 y.o.  1 Day Post-Op Procedure(s) (LRB): TOTAL KNEE ARTHROPLASTY (Left)    PROGRESS NOTE Subjective: Patient is alert, oriented, no Nausea, no Vomiting, yes passing gas, no Bowel Movement. Taking PO well. Denies SOB, Chest or Calf Pain. Using Incentive Spirometer, PAS in place. Ambulate not yet, CPM 0-60 Patient reports pain as 6 on 0-10 scale  .    Objective: Vital signs in last 24 hours: Filed Vitals:   08/31/11 0000 08/31/11 0238 08/31/11 0400 08/31/11 0500  BP:  110/70  123/64  Pulse:  80  74  Temp:  98.6 F (37 C)  98.6 F (37 C)  TempSrc:  Oral  Oral  Resp: 18 18 18 18   SpO2: 99% 97% 99% 99%      Intake/Output from previous day: I/O last 3 completed shifts: In: 1890 [P.O.:240; I.V.:1650] Out: 750 [Urine:450; Drains:200; Blood:100]   Intake/Output this shift: Total I/O In: 1051.7 [I.V.:1051.7] Out: -    LABORATORY DATA:  Basename 08/31/11 0704 08/31/11 0640 08/30/11 2139 08/30/11 1456  WBC -- 9.8 -- --  HGB -- 11.7* -- --  HCT -- 36.0 -- --  PLT -- 230 -- --  NA -- 136 -- --  K -- 4.1 -- --  CL -- 99 -- --  CO2 -- 25 -- --  BUN -- 8 -- --  CREATININE -- 0.76 -- --  GLUCOSE -- 237* -- --  GLUCAP 235* -- 163* 185*  INR -- -- -- --  CALCIUM -- 8.6 -- --    Examination: Neurologically intact ABD soft Neurovascular intact Sensation intact distally Intact pulses distally Dorsiflexion/Plantar flexion intact Incision: moderate drainage}  Assessment:   1 Day Post-Op Procedure(s) (LRB): TOTAL KNEE ARTHROPLASTY (Left) ADDITIONAL DIAGNOSIS:  Acute Blood Loss Anemia Patient Active Problem List  Diagnosis  . ANKLE PAIN, LEFT  . OTHER ENTHESOPATHY OF ANKLE AND TARSUS  . FOOT PAIN, LEFT  . ADVERSE DRUG REACTION  . Left knee pain  . Hypertension  . IBS (irritable bowel syndrome)  . Postoperative  nausea and vomiting  . Left ankle instability  . Left knee DJD  . Depression   Plan: PT/OT WBAT, CPM 5/hrs day until ROM 0-90 degrees, DVT Prophylaxis:  SCDx72hrs,lovenox DISCHARGE PLAN: Skilled Nursing Facility/Rehab DISCHARGE NEEDS: fluid bolus times 2, dressing change after 7 pm   Saline lock at 6pm     Millan Legan J 08/31/2011, 8:13 AM  2

## 2011-08-31 NOTE — Progress Notes (Signed)
CARE MANAGEMENT NOTE 08/31/2011  Patient:  ANGELLINA, FERDINAND   Account Number:  [id number]  Date Initiated:  08/31/2011  Documentation initiated by:  Vance Peper  Subjective/Objective Assessment:   51 yr old female s/p left total knee arthroplasty     Action/Plan:   patient is under worker's comp. Prior arrangements have been made for her to go to [facility] for shortterm rehab. Social Worker is aware.   Anticipated DC Date:  09/02/2011   Anticipated DC Plan:  SKILLED NURSING FACILITY  In-house referral  Clinical Social Worker      DC Planning Services  CM consult      [place] Choice  NA   Choice offered to / List presented to:             Status of service:  Completed, signed off Medicare Important Message given?   (If response is "NO", the following Medicare IM given date fields will be blank) Date Medicare IM given:   Date Additional Medicare IM given:    Discharge Disposition:  SKILLED NURSING FACILITY  Per UR Regulation:    If discussed at Long Length of Stay Meetings, dates discussed:    Comments:

## 2011-08-31 NOTE — Progress Notes (Addendum)
Clinical Social Work Department CLINICAL SOCIAL WORK PLACEMENT NOTE 08/31/2011  Patient:  Heather, Haley  Account Number:  [id number] Admit date:  08/30/2011  Clinical Social Worker:  Unk Lightning, LCSW  Date/time:  08/31/2011 02:30 PM  Clinical Social Work is seeking post-discharge placement for this patient at the following level of care:   SKILLED NURSING   (*CSW will update this form in Epic as items are completed)   08/31/2011  Patient/family provided with [facility] Department of Clinical Social Work's list of facilities offering this level of care within the geographic area requested by the patient (or if unable, by the patient's family).  08/31/2011  Patient/family informed of their freedom to choose among providers that offer the needed level of care, that participate in Medicare, Medicaid or managed care program needed by the patient, have an available bed and are willing to accept the patient.  08/31/2011  Patient/family informed of [facility]' ownership interest in [facility], as well as of the fact that they are under no obligation to receive care at this facility.  PASARR submitted to EDS on 08/31/2011 PASARR number received from EDS on   FL2 transmitted to all facilities in geographic area requested by pt/family on  08/31/2011 FL2 transmitted to all facilities within larger geographic area on   Patient informed that his/her managed care company has contracts with or will negotiate with  certain facilities, including the following:  Patient has worker's comp with [employer].  [facility] is working on negotiations for patient to be admitted there.   Patient/family informed of bed offers received:  09/01/2011  (DTC) Patient chooses bed at  [facility]  (DTC) Physician recommends and patient chooses bed at    Patient to be transferred to [facility] on     (DTC) Patient to be transferred to facility by ambulance  (DTC)  The following physician  request were entered in Epic:   Additional Comments: Needs 30 day signed by MD  30 day note signed by MD and faxed to [facility]. Awaiting assisgnment of PASARR number and for SNF to obtain worker's comp authorization. Above discussed with patient and nursing.      Lorri Frederick. [place] Pugh  [phone]    16:25  Received call from Donne Hazel- Admissions director of [facility].  She has received authorization from worker's Designer, industrial/product for patient to be moved to facililty. Notified patient, nursing and EMS contacted.  Patient is pleased with d/c plan.  Lorri Frederick. [place] Pugh  [phone]

## 2011-08-31 NOTE — Plan of Care (Signed)
Problem: Consults Goal: Diagnosis- Total Joint Replacement Primary Total Knee Left     

## 2011-08-31 NOTE — Progress Notes (Signed)
UR COMPLETED  

## 2011-08-31 NOTE — Progress Notes (Signed)
Clinical Social Work Department BRIEF PSYCHOSOCIAL ASSESSMENT 08/31/2011  Patient:  Heather Haley, BOILEAU     Account Number:  [id number]     Admit date:  08/30/2011  Clinical Social Worker:  Dennison Bulla  Date/Time:  08/31/2011 02:30 PM  Referred by:  Physician  Date Referred:  08/31/2011 Referred for  SNF Placement   Other Referral:   Interview type:  Patient Other interview type:    PSYCHOSOCIAL DATA Living Status:  ALONE Admitted from facility:   Level of care:   Primary support name:  Victorino Dike Primary support relationship to patient:  CHILD, ADULT Degree of support available:   Adequate    CURRENT CONCERNS Current Concerns  Post-Acute Placement   Other Concerns:    SOCIAL WORK ASSESSMENT / PLAN CSW received referral to assist with dc planning. CSW reviewed chart and met with patient at bedside. No visitors were present.    CSW introduced myself and explained role. Patient reported that she recently had surgery and felt she needed some assistance at dc. Patient reported that her MD recommended [facility]. Patient reports that she has Worker's Visual merchandiser to assist with payment for SNF. CSW explained process for SNF in regards to faxing information in order to determine if any beds were available. Patient was agreeable to this process.    CSW completed FL2 and faxed information to [facility]. CSW submitted pasarr but will need 30 day note signed by MD to complete process. CSW will follow up with bed offers.   Assessment/plan status:  Psychosocial Support/Ongoing Assessment of Needs Other assessment/ plan:   Information/referral to community resources:   SNF information    PATIENT'S/FAMILY'S RESPONSE TO PLAN OF CARE: Patient was alert and oriented. Patient was agreeable to CSW consult. Patient was engaged throughout assessment and asked appropriate questions.

## 2011-08-31 NOTE — Evaluation (Signed)
Physical Therapy Evaluation Patient Details Name: Heather Haley MRN: [id number] DOB: May 15, 1960 Today's Date: 08/31/2011 Time: 4098-1191 PT Time Calculation (min): 25 min  PT Assessment / Plan / Recommendation Clinical Impression  Pt is 51 y/o female admitted for s/p left TKA.  Pt emotional when entering room due to pain however improved after IV pain medication given.  Pt did c/o mild dizziness with standing and SaO2 ranging from 86% to 92%.  Pt will benefit from acute PT services to improve overall moblity to prepare for safe d/c to next venue.    PT Assessment  Patient needs continued PT services    Follow Up Recommendations  Skilled nursing facility    Barriers to Discharge        Equipment Recommendations  None recommended by PT    Recommendations for Other Services     Frequency 7X/week    Precautions / Restrictions Precautions Precautions: Knee Required Braces or Orthoses: Knee Immobilizer - Left Restrictions Weight Bearing Restrictions: Yes LLE Weight Bearing: Weight bearing as tolerated   Pertinent Vitals/Pain 10/10 left knee pain decreased to 5/10 left knee pain after pain medication      Mobility  Bed Mobility Bed Mobility: Supine to Sit;Sitting - Scoot to Edge of Bed Supine to Sit: 4: Min assist;With rails Sitting - Scoot to [place] of Bed: 4: Min assist;With rail Details for Bed Mobility Assistance: (A) to elevate left LE OOB.  Cues for correct techique Transfers Transfers: Sit to Stand;Stand to Sit Sit to Stand: 3: Mod assist;From bed Stand to Sit: 4: Min assist;To chair/3-in-1 Details for Transfer Assistance: (A) to initiate transfer and slowly descend to recliner with cues for hand placement and Left LE placement during transfers Ambulation/Gait Ambulation/Gait Assistance: 4: Min assist Ambulation Distance (Feet): 20 Feet Assistive device: Rolling walker Ambulation/Gait Assistance Details: (A) to maintain balance and RW placement. Cues for proper  step sequence Gait Pattern: Step-to pattern;Decreased stride length;Trunk flexed;Antalgic    Exercises Total Joint Exercises Ankle Circles/Pumps: Both;10 reps Quad Sets: Strengthening;Left;AAROM;10 reps Heel Slides: AAROM;Left;5 reps   PT Diagnosis: Difficulty walking;Abnormality of gait;Acute pain  PT Problem List: Decreased strength;Decreased range of motion;Decreased activity tolerance;Decreased mobility;Decreased knowledge of use of DME;Decreased knowledge of precautions;Pain PT Treatment Interventions:     PT Goals Acute Rehab PT Goals PT Goal Formulation: With patient Time For Goal Achievement: 09/07/11 Potential to Achieve Goals: Good Pt will go Supine/Side to Sit: with supervision PT Goal: Supine/Side to Sit - Progress: Goal set today Pt will go Sit to Supine/Side: with supervision PT Goal: Sit to Supine/Side - Progress: Goal set today Pt will go Sit to Stand: with supervision PT Goal: Sit to Stand - Progress: Goal set today Pt will go Stand to Sit: with supervision PT Goal: Stand to Sit - Progress: Goal set today Pt will Ambulate: >150 feet;with modified independence;with rolling walker PT Goal: Ambulate - Progress: Goal set today Pt will Perform Home Exercise Program: with supervision, verbal cues required/provided PT Goal: Perform Home Exercise Program - Progress: Goal set today  Visit Information  Last PT Received On: 08/31/11    Subjective Data  Subjective: "I'm in a lot of pain right now but they just gave me a lot of pain medications." Patient Stated Goal: To go to rehab   Prior Functioning  Home Living Lives With: Alone Available Help at Discharge: Skilled Nursing Facility Type of Home: House Home Access: Stairs to enter [employer] of Steps: 5 Entrance Stairs-Rails: Left Home Layout: One level Bathroom Shower/Tub:  Walk-in shower;Door [employer] Toilet: Standard Home Adaptive Equipment: Bedside commode/3-in-1;Walker - rolling;Straight  cane Prior Function Level of Independence: Independent with assistive device(s) (using cane) Able to Take Stairs?: Yes Driving: Yes Vocation: Unemployed Communication Communication: No difficulties Dominant Hand: Right    Cognition  Overall Cognitive Status: Appears within functional limits for tasks assessed/performed Arousal/Alertness: Awake/alert Orientation Level: Appears intact for tasks assessed Behavior During Session: [facility] for tasks performed    Extremity/Trunk Assessment Right Lower Extremity Assessment RLE ROM/Strength/Tone: Within functional levels Left Lower Extremity Assessment LLE ROM/Strength/Tone: Deficits;Unable to fully assess;Due to pain;Due to precautions LLE ROM/Strength/Tone Deficits: AAROM 5-35 degrees knee flexion   Balance    End of Session PT - End of Session Equipment Utilized During Treatment: Gait belt;Left knee immobilizer Activity Tolerance: Patient tolerated treatment well Patient left: in chair;with call bell/phone within reach Nurse Communication: Mobility status CPM Left Knee CPM Left Knee: Off  GP     Sehaj Mcenroe 08/31/2011, 10:08 AM Jake Shark, PT DPT [phone]

## 2011-09-01 LAB — BASIC METABOLIC PANEL
BUN: 13 mg/dL (ref 6–23)
Calcium: 8.6 mg/dL (ref 8.4–10.5)
GFR calc non Af Amer: >90 mL/min (ref >90)
Glucose, Bld: 234 mg/dL — ABNORMAL HIGH (ref 70–99)

## 2011-09-01 LAB — CBC
HCT: 32.4 % — ABNORMAL LOW (ref 36.0–46.0)
Hemoglobin: 10.7 g/dL — ABNORMAL LOW (ref 12.0–15.0)
MCH: 29.4 pg (ref 26.0–34.0)
MCHC: 33 g/dL (ref 30.0–36.0)

## 2011-09-01 LAB — GLUCOSE, CAPILLARY: Glucose-Capillary: 269 mg/dL — ABNORMAL HIGH (ref 70–99)

## 2011-09-01 MED ORDER — ENOXAPARIN SODIUM 30 MG/0.3ML ~~LOC~~ SOLN: 30.0000 mg | Freq: Two times a day (BID) | SUBCUTANEOUS | Status: DC

## 2011-09-01 MED ORDER — BISACODYL 5 MG PO TBEC: 10.0000 mg | DELAYED_RELEASE_TABLET | Freq: Every day | ORAL | Status: DC

## 2011-09-01 MED ORDER — BISACODYL 10 MG RE SUPP
10.0000 mg | Freq: Once | RECTAL | Status: AC
  Administered 2011-09-01: 10 mg via RECTAL
  Filled 2011-09-01: qty 1

## 2011-09-01 MED ORDER — METHOCARBAMOL 500 MG PO TABS: 500.0000 mg | ORAL_TABLET | Freq: Four times a day (QID) | ORAL | Status: AC | PRN

## 2011-09-01 MED ORDER — OXYCODONE HCL 5 MG PO TABS: 5.0000 mg | ORAL_TABLET | ORAL | Status: AC | PRN

## 2011-09-01 NOTE — Progress Notes (Signed)
Physical Therapy Treatment Patient Details Name: Heather Haley MRN: [id number] DOB: Feb 17, 1960 Today's Date: 09/01/2011 Time: 0917-0950 PT Time Calculation (min): 33 min  PT Assessment / Plan / Recommendation Comments on Treatment Session  Pt able to increase ambulation distance and improve overall mobility and prepare for safe d/c to next venue.  Will attempt further exercises this PM.    Follow Up Recommendations  Skilled nursing facility    Barriers to Discharge        Equipment Recommendations  None recommended by PT    Recommendations for Other Services    Frequency 7X/week   Plan Discharge plan remains appropriate;Frequency remains appropriate    Precautions / Restrictions Precautions Precautions: Knee Restrictions Weight Bearing Restrictions: Yes LLE Weight Bearing: Weight bearing as tolerated   Pertinent Vitals/Pain 3/10 left knee pain    Mobility  Bed Mobility Bed Mobility: Supine to Sit;Sitting - Scoot to Edge of Bed Supine to Sit: 4: Min assist;With rails Sitting - Scoot to [place] of Bed: 4: Min assist;With rail Sit to Supine: 4: Min guard;HOB flat Details for Bed Mobility Assistance: (A) to elevate left OOB.  [place] for safety to return to bed with cues for proper technique. Transfers Transfers: Sit to Stand;Stand to Sit Sit to Stand: 4: Min guard;From chair/3-in-1 Stand to Sit: 4: Min guard;To chair/3-in-1;To bed Details for Transfer Assistance: Minguard for safety with cues for hand and LE placement. Ambulation/Gait Ambulation/Gait Assistance: 4: Min guard Ambulation Distance (Feet): 60 Feet Assistive device: Rolling walker Ambulation/Gait Assistance Details: Minguard for safety with cues for    Exercises     PT Diagnosis:    PT Problem List:   PT Treatment Interventions:     PT Goals Acute Rehab PT Goals PT Goal Formulation: With patient Time For Goal Achievement: 09/07/11 Potential to Achieve Goals: Good Pt will go Supine/Side to Sit:  with supervision PT Goal: Supine/Side to Sit - Progress: Progressing toward goal Pt will go Sit to Supine/Side: with supervision PT Goal: Sit to Supine/Side - Progress: Progressing toward goal Pt will go Sit to Stand: with supervision PT Goal: Sit to Stand - Progress: Progressing toward goal Pt will go Stand to Sit: with supervision PT Goal: Stand to Sit - Progress: Progressing toward goal Pt will Ambulate: >150 feet;with modified independence;with rolling walker PT Goal: Ambulate - Progress: Progressing toward goal Pt will Perform Home Exercise Program: with supervision, verbal cues required/provided PT Goal: Perform Home Exercise Program - Progress: Progressing toward goal  Visit Information  Last PT Received On: 09/01/11 Assistance Needed: +1    Subjective Data  Subjective: "I've got tremors this morning.  Not sure why."   Cognition  Overall Cognitive Status: Appears within functional limits for tasks assessed/performed Arousal/Alertness: Awake/alert Orientation Level: Appears intact for tasks assessed Behavior During Session: [facility] for tasks performed    Balance     End of Session PT - End of Session Equipment Utilized During Treatment: Gait belt;Left knee immobilizer Activity Tolerance: Patient tolerated treatment well Patient left: in bed;in CPM;with call bell/phone within reach Nurse Communication: Mobility status CPM Left Knee CPM Left Knee: On Left Knee Flexion (Degrees): 70  Left Knee Extension (Degrees): 0    GP     Dezirae Service 09/01/2011, 12:56 PM Jake Shark, PT DPT [phone]

## 2011-09-01 NOTE — Progress Notes (Signed)
OT Note Order received, chart reviewed. Plan is for pt to d/c to [facility] today. Will sign off and defer OT eval to that venue.  Garrel Ridgel, OTR/L  Pager [phone] 09/01/2011

## 2011-09-01 NOTE — Discharge Summary (Signed)
Patient ID: Heather Haley MRN: [id number] DOB/AGE: 06/08/1960 51 y.o.  Admit date: 08/30/2011 Discharge date: 09/01/2011  Admission Diagnoses:  Principal Problem:  *Left knee DJD Active Problems:  Left knee pain  Hypertension  IBS (irritable bowel syndrome)  Postoperative nausea and vomiting  Left ankle instability  Depression   Discharge Diagnoses:  Same  Past Medical History  Diagnosis Date  . Hypertension   . IBS (irritable bowel syndrome)   . Postoperative nausea and vomiting   . Diabetes mellitus   . Left ankle instability   . Headache   . Left knee DJD   . Depression   . Frequency of urination   . GERD (gastroesophageal reflux disease)     Surgeries: Procedure(s): TOTAL KNEE ARTHROPLASTY on 08/30/2011   Consultants:    Discharged Condition: Improved  Hospital Course: Heather Haley is an 51 y.o. female who was admitted 08/30/2011 for operative treatment ofLeft knee DJD. Patient has severe unremitting pain that affects sleep, daily activities, and work/hobbies. After pre-op clearance the patient was taken to the operating room on 08/30/2011 and underwent  Procedure(s): TOTAL KNEE ARTHROPLASTY.    Patient was given perioperative antibiotics: Anti-infectives     Start     Dose/Rate Route Frequency Ordered Stop   08/30/11 2359   vancomycin (VANCOCIN) IVPB 1000 mg/200 mL premix        1,000 mg 200 mL/hr over 60 Minutes Intravenous Every 12 hours 08/30/11 1642 08/31/11 0157   08/30/11 1304   tobramycin (NEBCIN) powder  Status:  Discontinued          As needed 08/30/11 1305 08/30/11 1443   08/29/11 0948   vancomycin (VANCOCIN) 1,500 mg in sodium chloride 0.9 % 500 mL IVPB        1,500 mg 250 mL/hr over 120 Minutes Intravenous 120 min pre-op 08/29/11 0948 08/30/11 1200          On post op day one patient was coming back from the bathroom without her knee immobilizer.  Her left knee buckled.  She stumbled but did not fall.  Wound is still well  approximated and x rays showed no abnormaility  Patient was given sequential compression devices, early ambulation, and chemoprophylaxis to prevent DVT.  Patient benefited maximally from hospital stay and there were no other  complications.    Recent vital signs:  Patient Vitals for the past 24 hrs:  BP Temp Temp src Pulse Resp SpO2  09/01/11 0552 131/67 mmHg 97 F (36.1 C) - 71  20  98 %  09/01/11 0000 - - - - 16  -  08/31/11 2158 128/70 mmHg 97.3 F (36.3 C) - 73  19  94 %  08/31/11 2000 - - - - 16  -  08/31/11 1300 131/75 mmHg 98.3 F (36.8 C) Oral 85  18  98 %     Recent laboratory studies:   [facility] 08/31/11 0640  WBC 9.8  HGB 11.7*  HCT 36.0  PLT 230  NA 136  K 4.1  CL 99  CO2 25  BUN 8  CREATININE 0.76  GLUCOSE 237*  INR --  CALCIUM 8.6     Discharge Medications:   Medication List  As of 09/01/2011  7:39 AM   STOP taking these medications         acetaminophen 500 MG tablet      HYDROcodone-acetaminophen 5-500 MG per tablet      traMADol 50 MG tablet  TAKE these medications         ARTIFICIAL TEARS OP   Apply 2 drops to eye 4 (four) times daily as needed. Dry eye      bisacodyl 5 MG EC tablet   Commonly known as: DULCOLAX   Take 2 tablets (10 mg total) by mouth daily at 8 pm.      cetirizine 10 MG tablet   Commonly known as: ZYRTEC   Take 10 mg by mouth at bedtime.      desvenlafaxine 50 MG 24 hr tablet   Commonly known as: PRISTIQ   Take 50 mg by mouth daily.      docusate sodium 100 MG capsule   Commonly known as: COLACE   Take 200 mg by mouth 2 (two) times daily as needed. 200 mg every morning and if needed 200 mg in the evening      enoxaparin 30 MG/0.3ML injection   Commonly known as: LOVENOX   Inject 0.3 mLs (30 mg total) into the skin every 12 (twelve) hours.      fluticasone 50 MCG/ACT nasal spray   Commonly known as: FLONASE   Place 2 sprays into the nose daily.      metFORMIN 850 MG tablet   Commonly known as:  GLUCOPHAGE   Take 850 mg by mouth 2 (two) times daily with a meal.      methocarbamol 500 MG tablet   Commonly known as: ROBAXIN   Take 1 tablet (500 mg total) by mouth every 6 (six) hours as needed.      omeprazole 20 MG capsule   Commonly known as: PRILOSEC   Take 20 mg by mouth daily.      oxyCODONE 5 MG immediate release tablet   Commonly known as: Oxy IR/ROXICODONE   Take 1-2 tablets (5-10 mg total) by mouth every 4 (four) hours as needed.      pravastatin 40 MG tablet   Commonly known as: PRAVACHOL   Take 40 mg by mouth daily.      promethazine 25 MG tablet   Commonly known as: PHENERGAN   Take 25 mg by mouth every 6 (six) hours as needed. For nausea      propranolol 80 MG 24 hr capsule   Commonly known as: INNOPRAN XL   Take 80 mg by mouth at bedtime.      zolpidem 10 MG tablet   Commonly known as: AMBIEN   Take 10 mg by mouth at bedtime as needed. For sleep            Diagnostic Studies: Dg Chest 2 View  08/23/2011  *RADIOLOGY REPORT*  Clinical Data: Smoker, hypertension, diabetes, preoperative evaluation for knee replacement  CHEST - 2 VIEW  Comparison:  None.  Findings:  The heart size and mediastinal contours are within normal limits.  Both lungs are clear.  The visualized skeletal structures are unremarkable.  IMPRESSION: No active cardiopulmonary disease.  Original Report Authenticated By: Judie Petit. Ruel Favors, M.D.   Dg Knee Left Port  08/31/2011  *RADIOLOGY REPORT*  Clinical Data: Knee buckling.  1 day post arthroplasty.  PORTABLE LEFT KNEE - 1-2 VIEW  Comparison: 03/26/2011 MR.  Findings: Knee is held in flexion.  Two-view examination without fracture or dislocation or complication related to the total left knee prosthesis which appears in satisfactory position.  IMPRESSION: Knee is held in flexion.  Two-view examination without fracture or dislocation or complication related to the total left knee prosthesis which appears in satisfactory position.  Original Report  Authenticated By: Fuller Canada, M.D.    Disposition: 01-Home or Self Care  Discharge Orders    Future Orders Please Complete By Expires   Diet - low sodium heart healthy      Call MD / Call 911      Comments:   If you experience chest pain or shortness of breath, CALL 911 and be transported to the hospital emergency room.  If you develope a fever above 101 F, pus (white drainage) or increased drainage or redness at the wound, or calf pain, call your surgeon's office.   Constipation Prevention      Comments:   Drink plenty of fluids.  Prune juice may be helpful.  You may use a stool softener, such as Colace (over the counter) 100 mg twice a day.  Use MiraLax (over the counter) for constipation as needed.   Increase activity slowly as tolerated      Discharge instructions      Comments:   Total Knee Replacement Care After Refer to this sheet in the next few weeks. These discharge instructions provide you with general information on caring for yourself after you leave the hospital. Your caregiver may also give you specific instructions. Your treatment has been planned according to the most current medical practices available, but unavoidable complications sometimes occur. If you have any problems or questions after discharge, please call your caregiver. Regaining a near full range of motion of your knee within the first 3 to 6 weeks after surgery is critical. HOME CARE INSTRUCTIONS  You may resume a normal diet and activities as directed.  Perform exercises as directed.  Place yellow foam block, yellow side up under heel at all times except when in CPM or when walking.  DO NOT modify, tear, cut, or change in any way. You will receive physical therapy daily  Take showers instead of baths until informed otherwise.  Change bandages (dressings)daily Do not take over-the-counter or prescription medicines for pain, discomfort, or fever. Eat a well-balanced diet.  Avoid lifting or driving until  you are instructed otherwise.  Make an appointment to see your caregiver for stitches (suture) or staple removal as directed.  If you have been sent home with a continuous passive motion machine (CPM machine), 0-90 degrees 6 hrs a day   2 hrs a shift SEEK MEDICAL CARE IF: You have swelling of your calf or leg.  You develop shortness of breath or chest pain.  You have redness, swelling, or increasing pain in the wound.  There is pus or any unusual drainage coming from the surgical site.  You notice a bad smell coming from the surgical site or dressing.  The surgical site breaks open after sutures or staples have been removed.  There is persistent bleeding from the suture or staple line.  You are getting worse or are not improving.  You have any other questions or concerns.  SEEK IMMEDIATE MEDICAL CARE IF:  You have a fever.  You develop a rash.  You have difficulty breathing.  You develop any reaction or side effects to medicines given.  Your knee motion is decreasing rather than improving.  MAKE SURE YOU:  Understand these instructions.  Will watch your condition.  Will get help right away if you are not doing well or get worse.   CPM      Comments:   Continuous passive motion machine (CPM):      Use the CPM from 0 to 90 for  6 hours per day.       You may break it up into 2 or 3 sessions per day.      Use CPM for 2 weeks or until you are told to stop.   TED hose      Comments:   Use stockings (TED hose) for 2 weeks on both leg(s).  You may remove them at night for sleeping.   Change dressing      Comments:   Change the dressing daily with sterile 4 x 4 inch gauze dressing and apply TED hose.  You may clean the incision with alcohol prior to redressing.   Do not put a pillow under the knee. Place it under the heel.      Comments:   Place yellow foam block, yellow side up under heel at all times except when in CPM or when walking.  DO NOT modify, tear, cut, or change in any way the  yellow foam block.      Follow-up Information    Follow up with Nilda Simmer, MD on 09/14/2011. (appt time 9 am)    Contact information:   [facility] [address] [place] [address] [phone]           Signed: Pascal Lux 09/01/2011, 7:39 AM

## 2011-09-01 NOTE — Progress Notes (Signed)
Physical Therapy Progress Note   09/01/11 1300  PT Visit Information  Last PT Received On 09/01/11  Assistance Needed +1  PT Time Calculation  PT Start Time 1355  PT Stop Time 1423  PT Time Calculation (min) 28 min  Subjective Data  Subjective "I'm feeling better today."  Precautions  Precautions Knee  Required Braces or Orthoses Knee Immobilizer - Left  Knee Immobilizer - Left Discontinue post op day 2  Restrictions  Weight Bearing Restrictions Yes  LLE Weight Bearing WBAT  Cognition  Overall Cognitive Status Appears within functional limits for tasks assessed/performed  Arousal/Alertness Awake/alert  Orientation Level Appears intact for tasks assessed  Behavior During Session [facility] for tasks performed  Bed Mobility  Bed Mobility Supine to Sit;Sitting - Scoot to Edge of Bed  Supine to Sit 4: Min guard  Sitting - Scoot to [place] of Bed 4: Min guard  Details for Bed Mobility Assistance Minguard for safety with cues technique  Transfers  Transfers Sit to Stand;Stand to Sit  Sit to Stand 4: Min guard;From chair/3-in-1  Stand to Sit 4: Min guard;To chair/3-in-1;To bed  Details for Transfer Assistance Minguard for safety with cues for hand placement and LE placement  Ambulation/Gait  Ambulation/Gait Assistance 4: Min assist  Ambulation Distance (Feet) 60 Feet  Assistive device Rolling walker  Ambulation/Gait Assistance Details (A) due to LOB with ambulating backwards.  Cues for safety with  proper step sequence.  Pt did c/o mild knee buckling with morning session therefore donn KI in PM session for stability.  Pt   Gait Pattern Step-to pattern;Decreased stride length;Trunk flexed;Antalgic  Exercises  Exercises Total Joint  Total Joint Exercises  Ankle Circles/Pumps Both;10 reps  Quad Sets AAROM;Strengthening;Both;10 reps  Heel Slides AAROM;Left;10 reps  Short Arc Quad Strengthening;Left;10 reps  Hip ABduction/ADduction Strengthening;Left;10 reps  Straight Leg Raises  Strengthening;Left;10 reps  PT - End of Session  Equipment Utilized During Treatment Gait belt;Left knee immobilizer  Activity Tolerance Patient tolerated treatment well  Patient left in chair;with call bell/phone within reach  Nurse Communication Mobility status  PT - Assessment/Plan  Comments on Treatment Session Pt continues to show progress and able to increase ambulation distance and complete HEP.  Will continue to follow  PT Plan Discharge plan remains appropriate;Frequency remains appropriate  PT Frequency 7X/week  Follow Up Recommendations Skilled nursing facility  Equipment Recommended None recommended by PT  Acute Rehab PT Goals  PT Goal Formulation With patient  Time For Goal Achievement 09/07/11  Potential to Achieve Goals Good  Pt will go Supine/Side to Sit with supervision  PT Goal: Supine/Side to Sit - Progress Progressing toward goal  Pt will go Sit to Supine/Side with supervision  PT Goal: Sit to Supine/Side - Progress Progressing toward goal  Pt will go Sit to Stand with supervision  PT Goal: Sit to Stand - Progress Progressing toward goal  Pt will go Stand to Sit with supervision  PT Goal: Stand to Sit - Progress Progressing toward goal  Pt will Ambulate >150 feet;with modified independence;with rolling walker  PT Goal: Ambulate - Progress Progressing toward goal  Pt will Perform Home Exercise Program with supervision, verbal cues required/provided  PT Goal: Perform Home Exercise Program - Progress Progressing toward goal  PT General Charges  $$ ACUTE PT VISIT 1 Procedure  PT Treatments  $Gait Training 8-22 mins  $Therapeutic Exercise 8-22 mins    [place], [place] DPT [phone]

## 2011-09-13 ENCOUNTER — Ambulatory Visit (INDEPENDENT_AMBULATORY_CARE_PROVIDER_SITE_OTHER): Payer: Worker's Compensation | Admitting: Sports Medicine

## 2011-09-13 ENCOUNTER — Encounter: Payer: Self-pay | Admitting: Sports Medicine

## 2011-09-13 VITALS — BP 139/89 | Ht 65.0 in | Wt 260.0 lb

## 2011-09-13 DIAGNOSIS — S93409A Sprain of unspecified ligament of unspecified ankle, initial encounter: Secondary | ICD-10-CM

## 2011-09-13 NOTE — Progress Notes (Signed)
7/10 pain scale 

## 2011-09-13 NOTE — Progress Notes (Signed)
  Subjective:    Patient ID: Heather Haley, female    DOB: [birth year], 51 y.o.   MRN: [id number]  HPI  51 year old F who is 2 weeks s/p left total knee replacement who presents with left ankle pain and swelling. It started on 7/25 when she was being pushed in her wheelchair and got her left foot caught under the chair. She felt severe pain in her dorsal foot and anterior left leg. Since that time the pain has improved, but the swelling is worse. Since that time she has ambulated without problems. She currently wears a left shoot with built-in AFO and has a history of 2 surgeries on the left ankle in 2008 and 2009 for tendon repairs.   Review of Systems  All other systems reviewed and are negative.       Objective:   Physical Exam  Vitals reviewed. Musculoskeletal:       Left ankle: She exhibits decreased range of motion and swelling. She exhibits no deformity, no laceration and normal pulse. tenderness. Lateral malleolus, medial malleolus and AITFL tenderness found. No CF ligament, no posterior TFL, no head of 5th metatarsal and no proximal fibula tenderness found. Achilles tendon exhibits no pain.       Left foot: Normal. She exhibits normal range of motion, no tenderness, no bony tenderness and no deformity.       Well healed lateral incisions; decreased lateral motion of ankle; 2+ edema of medial and lateral malleolus  Neurological: She has normal strength. No sensory deficit.    BP 139/89  Ht 5\' 5"  (1.651 m)  Wt 260 lb (117.935 kg)  BMI 43.27 kg/m2     Assessment & Plan:  51 year old female with left ankle pain that likely represents a sprain. There is minimal bony tenderness and no deformity necessitating immediate imaging. She has an appointment with Dr. Thurston Hole tomorrow as follow up for her left knee arthroplasty. Therefore, we will defer to his judgement as to whether her ankle needs any further evaluation. We will place her into a med-spec brace for comfort. She has used  this in the past with good results.

## 2011-10-06 ENCOUNTER — Ambulatory Visit
Admission: RE | Admit: 2011-10-06 | Discharge: 2011-10-06 | Disposition: A | Discharge status: Home / Self Care | Payer: Worker's Compensation | Source: Ambulatory Visit | Attending: Orthopedic Surgery | Admitting: Orthopedic Surgery

## 2011-10-06 ENCOUNTER — Other Ambulatory Visit: Payer: Self-pay | Admitting: Orthopedic Surgery

## 2011-10-06 DIAGNOSIS — R52 Pain, unspecified: Secondary | ICD-10-CM

## 2011-10-06 DIAGNOSIS — R609 Edema, unspecified: Secondary | ICD-10-CM

## 2011-10-12 ENCOUNTER — Encounter: Payer: Self-pay | Admitting: Sports Medicine

## 2011-11-03 ENCOUNTER — Ambulatory Visit (INDEPENDENT_AMBULATORY_CARE_PROVIDER_SITE_OTHER): Payer: Worker's Compensation | Admitting: Sports Medicine

## 2011-11-03 VITALS — BP 138/80 | Ht 65.0 in | Wt 260.0 lb

## 2011-11-03 DIAGNOSIS — M24873 Other specific joint derangements of unspecified ankle, not elsewhere classified: Secondary | ICD-10-CM

## 2011-11-03 DIAGNOSIS — M25372 Other instability, left ankle: Secondary | ICD-10-CM

## 2011-11-03 NOTE — Assessment & Plan Note (Addendum)
Patient does have chronic functional ankle instability of his left side. Patient still has trace swelling around the peroneal tendons as well as the midfoot. Patient does have soft custom orthotics in her shoes, these were adjusted and had the addition of a metatarsal pad placed on the left side to help with breakdown of transverse arch. Patient encouraged to wear the ankle compression sleeve during the day. This will help with the swelling. Patient encouraged to continue doing the exercises for range of motion and strengthening for her ankles as she has for home exercise program. Patient started going to physical therapy for her knee. Would like her to rehabilitate her knee completely and see if that helps resolve some of his ankle pain as well. Patient will come back in 3 months time for reevaluation.

## 2011-11-03 NOTE — Patient Instructions (Signed)
Very good to see you We change your orthotic a little and I think it will help.  I want you to wear the compression on your ankle more frequently. Especially during the day.  Continue the rehab and being active, this will hopefully continue to help.  At this point you know where we are but please come back in 3 months.

## 2011-11-03 NOTE — Progress Notes (Signed)
  Subjective:    Patient ID: Heather Haley, female    DOB: 07-04-1960, 51 y.o.   MRN: [id number]   HPI  Pt presents to clinic for f/u of lt ankle which is still painful especially laterally and midfoot. Patient did have a lateral ray post placed after last visit which has been somewhat beneficial. Patient states that she still has a constant throbbing sensation in the foot as well as swelling. Patient has not been wearing her compression on the regular basis and only wearing her at night. Patient has been doing some ice baths but not as religiously as before.  Patient's original injury was back in 2008.  She takes tramadol 6 tabs per day for pain and vicodin when pain is severe.  Patient did have a total knee replacement done by Dr. Thurston Hole in July 2013. Patient states her knee pain is significantly improved and she is doing very well in physical therapy. Patient states that this made help her ankle a little bit but overall still continuing to give her significant problems.    Review of Systems     Objective:   Physical Exam Filed Vitals:   11/03/11 0953  BP: 138/80  Pleasant NAD  Lt foot/ankle exam: Swelling along lt peroneal sheath - this is somewhat less than most visits Lateral foot pain going forward from MT but improved from previous visit Pain across midfoot and patient has trace edema over the midfoot on the dorsal left No MT bossing but breakdown of the transverse arch Weak on peroneals with eversion Strong dorsi and plantar flexion Weak on inversion and plantar flexion  Walking the left foot oversupinates prob 2/2 peroneal weakness  Well healed midline scar over the left knee Knee is moderately warm She has good flexion and lacks a few degrees of full extension

## 2012-01-14 ENCOUNTER — Other Ambulatory Visit: Payer: Self-pay | Admitting: Family Medicine

## 2012-01-14 DIAGNOSIS — G8929 Other chronic pain: Secondary | ICD-10-CM

## 2012-01-17 ENCOUNTER — Ambulatory Visit
Admission: RE | Admit: 2012-01-17 | Discharge: 2012-01-17 | Disposition: A | Discharge status: Home / Self Care | Payer: Medicare Other | Source: Ambulatory Visit | Attending: Family Medicine | Admitting: Family Medicine

## 2012-01-17 DIAGNOSIS — G8929 Other chronic pain: Secondary | ICD-10-CM

## 2012-01-25 ENCOUNTER — Encounter: Payer: Worker's Compensation | Admitting: Sports Medicine

## 2012-02-11 ENCOUNTER — Encounter: Payer: Self-pay | Admitting: Sports Medicine

## 2012-02-11 ENCOUNTER — Ambulatory Visit (INDEPENDENT_AMBULATORY_CARE_PROVIDER_SITE_OTHER): Payer: Worker's Compensation | Admitting: Sports Medicine

## 2012-02-11 VITALS — BP 153/96 | HR 64 | Ht 65.0 in | Wt 260.0 lb

## 2012-02-11 DIAGNOSIS — M25579 Pain in unspecified ankle and joints of unspecified foot: Secondary | ICD-10-CM

## 2012-02-11 NOTE — Progress Notes (Signed)
  Subjective:    Patient ID: Heather Haley, female    DOB: 12-26-1960, 52 y.o.   MRN: [id number]   HPI  Pt presents to clinic for f/u of lt ankle which she states is about the same. She still having complaints of more the medial heel pain than anything else. Patient though has had decreased swelling after having her knee replaced in July. Patient has noticed it does seem to help somewhat for some time. Patient was given a compression ankle sleeve at last visit which he does wear at night. During the day though she thinks that this hurts more and has not been wearing it. Patient is wearing an AFO on a regular basis and is having a new one fitted today. Patient also had adjustments by her podiatrist on her insoles which is also seem to make a difference..patient has had no changes in medications. Patient was seen previously and did have x-rays done of her right ankle which were reviewed by me today. Patient had no bony abnormalities noted. Patient does have some mild spurring of the dorsal aspect of the navicular bone otherwise unremarkable.  Patient did have a total knee replacement done by Dr. Thurston Hole in July 2013. Patient also has history of peroneal tendon translocation greater than 2 years ago.   Review of Systems 14 system review is done and unremarkable as related to the orthopedic problem. Patient though does state that she was having a headache and some visual changes and has been having some lab work done what sounds to be potential temporal arteritis. Patient has been on prednisone recently and states that this may be helping her ankle as well.    Objective:   Physical Exam Filed Vitals:   02/11/12 1135  BP: 153/96  Pulse: 64  Pleasant NAD  Lt foot/ankle exam: Swelling along lt peroneal sheath but continues to improve Lateral foot pain going forward from MT but improved from previous visit Pain across midfoot but no effusion this time. No MT bossing but breakdown of the  transverse arch Weak on peroneals with eversion Strong dorsi and plantar flexion Weak on inversion and plantar flexion  Walking the left foot oversupinates prob 2/2 peroneal weakness, no significant change from previous visit.  Well healed midline scar over the left knee Knee is moderately warm She has good flexion and lacks a few degrees of full extension. Neurovascularly intact distally.

## 2012-02-11 NOTE — Patient Instructions (Addendum)
Very good to see you Try the new brace during the day.  This should help with a little more stability.  Continue to see your doctor about your eye.  This is very important for you to follow up on.  Continue with the new insoles and I hope the new brace will help as well.  We should see you again in 3-4 months to make sure you continue to improve.  Happy New Year!

## 2012-02-11 NOTE — Assessment & Plan Note (Signed)
Patient is still coping with postsurgical changes. I also think secondary to her weight. Patient was given a different compression sleeve that his lower profile and would help more with stability and swelling. Hopefully this will be more comfortable during the day. Patient will continue to wear the compression sleeve at night to help with any swelling.we are hoping that the AFO brace will be a lighter material which could also be beneficial to patient. Patient though she can call us at any time for prescription refills. Patient will followup again in 3-4 months for further evaluation.

## 2012-03-28 ENCOUNTER — Ambulatory Visit
Admission: RE | Admit: 2012-03-28 | Discharge: 2012-03-28 | Disposition: A | Discharge status: Home / Self Care | Payer: Worker's Compensation | Source: Ambulatory Visit | Attending: Orthopedic Surgery | Admitting: Orthopedic Surgery

## 2012-03-28 ENCOUNTER — Other Ambulatory Visit: Payer: Self-pay | Admitting: Orthopedic Surgery

## 2012-03-28 DIAGNOSIS — R609 Edema, unspecified: Secondary | ICD-10-CM

## 2012-04-27 ENCOUNTER — Encounter: Payer: Self-pay | Admitting: Family Medicine

## 2012-04-27 NOTE — Telephone Encounter (Signed)
This encounter was created in error - please disregard.

## 2012-04-27 NOTE — Telephone Encounter (Signed)
Can you make erroneous encounter please? I answered this question for you! =)  Thanks!

## 2012-05-08 ENCOUNTER — Telehealth: Payer: Self-pay | Admitting: Family Medicine

## 2012-05-08 MED ORDER — ALPRAZOLAM 0.5 MG PO TABS: 0.5000 mg | ORAL_TABLET | Freq: Four times a day (QID) | ORAL | Status: DC | PRN

## 2012-05-08 NOTE — Telephone Encounter (Signed)
OK to refill

## 2012-05-08 NOTE — Telephone Encounter (Signed)
rx refilled.

## 2012-05-08 NOTE — Telephone Encounter (Signed)
Ok to refill 

## 2012-05-09 ENCOUNTER — Telehealth: Payer: Self-pay | Admitting: Family Medicine

## 2012-05-09 MED ORDER — ZOLPIDEM TARTRATE 10 MG PO TABS: 10.0000 mg | ORAL_TABLET | Freq: Every evening | ORAL | Status: DC | PRN

## 2012-05-09 NOTE — Telephone Encounter (Signed)
Ok to refill #30 with 2 refills.  

## 2012-05-09 NOTE — Telephone Encounter (Signed)
?  ok to refill °

## 2012-05-09 NOTE — Telephone Encounter (Signed)
[facility]

## 2012-05-26 ENCOUNTER — Ambulatory Visit (INDEPENDENT_AMBULATORY_CARE_PROVIDER_SITE_OTHER): Payer: BC Managed Care – PPO | Admitting: Nurse Practitioner

## 2012-05-26 ENCOUNTER — Encounter: Payer: Self-pay | Admitting: Nurse Practitioner

## 2012-05-26 VITALS — BP 134/81 | HR 86 | Ht 66.0 in | Wt 261.0 lb

## 2012-05-26 DIAGNOSIS — G4710 Hypersomnia, unspecified: Secondary | ICD-10-CM | POA: Insufficient documentation

## 2012-05-26 DIAGNOSIS — R51 Headache: Secondary | ICD-10-CM

## 2012-05-26 MED ORDER — TOPIRAMATE 50 MG PO TABS: 50.0000 mg | ORAL_TABLET | Freq: Two times a day (BID) | ORAL | Status: DC

## 2012-05-26 MED ORDER — SUMATRIPTAN SUCCINATE 100 MG PO TABS: ORAL_TABLET | ORAL | Status: DC

## 2012-05-26 NOTE — Patient Instructions (Addendum)
Sleep study ordered. Continue same dose of Topamax.  Increase Imitrex to 100mg  prn headaches Read over information on migraine triggers. F/U after sleep study

## 2012-05-26 NOTE — Progress Notes (Signed)
HPI: The patient returns for followup after last visit with Dr. Terrace Arabia on 04/03/2012 for headaches, located in the left temporoparietal area associated with light sensitivity and worsened by movement. She was placed on Topamax at her last visit but does not report a change in frequency of headache. She decreased her Gabapentin to once daily but was not told to do that by Dr. Terrace Arabia. She continues with daytime drowsiness and ESS score is 12. She denies snoring but frequently wakes with a headache. She is using the 50mg  of Imitrex but it does not completely take away the headache.    ROS:   weight gain, ringing in ears, blurred vision, easy brusing, confusion, headache, numbness, weakness, insomina, depression, anxiety, not enough sleep, decreased energy, change in appetite  Physical Exam General: well developed, well nourished, seated, in no evident distress Head: head normocephalic and atraumatic. Oropharynx benign Neck: supple with no carotid or supraclavicular bruits Cardiovascular: regular rate and rhythm, no murmurs  Neurologic Exam Mental Status: Awake and fully alert. Oriented to place and time.ESS 12.  Mood and affect appropriate.  Cranial Nerves:  Pupils equal, briskly reactive to light. Extraocular movements full without nystagmus. Visual fields full to confrontation. Hearing intact and symmetric to finger snap. Facial sensation intact. Face, tongue, palate move normally and symmetrically. Neck flexion and extension normal.  Motor: Normal bulk and tone. Normal strength in all tested extremity muscles.Brace to left leg.  Sensory.: intact to touch and pinprick and vibratory.  Coordination: Rapid alternating movements normal in all extremities. Finger-to-nose and heel-to-shin performed accurately bilaterally. Gait and Station: Arises from chair without difficulty. Mild limp using cane. No difficulty with turns.   Reflexes: 2+ and symmetric. Toes downgoing.     ASSESSMENT: Headache with migraine  features,normal neurologic exam  hypersomnia with ESS score of 12, BMI of 42.13, headaches on awakening     PLAN: Continue Topamax 50mg  twice daily Increase Imitrex to 100mg  prn acute migraine Gabapentin 300mg  three times daily Sleep study Given written handout on migraine triggers and additional 10 min spent face to face answering questions F/U after sleep study   Nilda Riggs, GNP-BC APRN

## 2012-05-30 ENCOUNTER — Other Ambulatory Visit: Payer: Self-pay | Admitting: Family Medicine

## 2012-05-30 NOTE — Telephone Encounter (Signed)
Ok to refill 

## 2012-05-30 NOTE — Telephone Encounter (Signed)
Last OV 3010/14  Last refill 03/16/12?? Hydrocodone/apap 5-325 q6hr prn # 30 Need approval for controlled medication.

## 2012-05-31 NOTE — Telephone Encounter (Signed)
Medication refilled per protocol. 

## 2012-06-13 ENCOUNTER — Ambulatory Visit (INDEPENDENT_AMBULATORY_CARE_PROVIDER_SITE_OTHER): Payer: Worker's Compensation | Admitting: Sports Medicine

## 2012-06-13 VITALS — BP 143/88 | Ht 65.0 in | Wt 250.0 lb

## 2012-06-13 DIAGNOSIS — M24873 Other specific joint derangements of unspecified ankle, not elsewhere classified: Secondary | ICD-10-CM

## 2012-06-13 DIAGNOSIS — M25572 Pain in left ankle and joints of left foot: Secondary | ICD-10-CM

## 2012-06-13 DIAGNOSIS — M25579 Pain in unspecified ankle and joints of unspecified foot: Secondary | ICD-10-CM

## 2012-06-13 DIAGNOSIS — M25372 Other instability, left ankle: Secondary | ICD-10-CM

## 2012-06-13 NOTE — Progress Notes (Signed)
Patient ID: Heather Haley, female   DOB: Feb 27, 1960, 52 y.o.   MRN: [id number]  Patient with chronic left ankle pain Has had a reconstruction and surgery over peroneal tendons This never healed completely and pain and swelling persisted  Recently worse without specific cause Still wears rigid ankle support fitted to shoes to block instability Has a new one in past 3 months  Has diabetic insoles  We gave her an ankle support - full ankle body helix? - that one feels most comfortable She was also given shorter ankle support and that helps some  Ice feels good but heat hurts  Uses a cane  Meds: Vicodin at least 1 tablet per day and tramadol 100 tid to control pain  Past Hx:  TKR by dr Thurston Hole feels great  ROS : too much ankle pain triggers her migraine episodes at times/ does not feel depressed - has a new puppy!  Severe heart burn with NSAIDs (hospitalized once)  Exam  Pleasant, obese, NAD  Left ankle longitudinal scar starting at 5 cm above lat malleolus to just above 5th MT Hypersensitivity to touch Mild swelling over peroneal tendon sheath Slight swelling in sinus tarsi Ankle flexion and extension is not painful Inversion 3+ painful Eversion 4+ painful

## 2012-06-13 NOTE — Assessment & Plan Note (Signed)
I think she is on a very reasonable ankle and foot pain medication regimen  Continue the tramadol 100 mg 3 times a day  I feel she should use at least one Vicodin at night regularly as she does not sleep well through the night and I think that may be a trigger for her migraine headache issues  She will continue to have her medications filled through Dr. Tanya Nones to coordinate her complex medical regimen

## 2012-06-13 NOTE — Patient Instructions (Addendum)
Use 1 Vicodin at night on a regular basis Dr Tanya Nones has been controlling medications and I think she is on a good regimen that at least helps her pain level stay reasonable  If you do not sleep I think you will get more swelling and pain in ankle  Start Vit C 500 and CAlcium 1500 mgm and 800 IU of Vitamin D Both have been helpful in chronic painful joint injury  Keep up icing  Try arch pad to see if that lessens pain going under the foot  Use ankle compression sleeve that goes a bit higher  Recheck with me as needed

## 2012-06-13 NOTE — Assessment & Plan Note (Signed)
She continues to use the rigid brace fixed to her shoe She replace this recently and we'll need to do so when the metal struts develop any bending  Continue to use an ankle compression sleeve will not add to the stability but will at least keep down some of the swelling

## 2012-06-14 ENCOUNTER — Telehealth: Payer: Self-pay | Admitting: Family Medicine

## 2012-06-14 ENCOUNTER — Other Ambulatory Visit: Payer: Self-pay | Admitting: Family Medicine

## 2012-06-14 MED ORDER — CETIRIZINE HCL 10 MG PO TABS: 10.0000 mg | ORAL_TABLET | Freq: Every day | ORAL | Status: DC

## 2012-06-14 NOTE — Telephone Encounter (Signed)
Rx Refilled  

## 2012-06-16 ENCOUNTER — Other Ambulatory Visit: Payer: Self-pay | Admitting: Neurology

## 2012-06-16 ENCOUNTER — Telehealth: Payer: Self-pay | Admitting: *Deleted

## 2012-06-16 DIAGNOSIS — G4710 Hypersomnia, unspecified: Secondary | ICD-10-CM

## 2012-06-16 NOTE — Telephone Encounter (Signed)
MD SLEEP REVIEW FOR 52 YEAR OLD FEMALE Heather Haley  REFERRING MD:  Darrol Angel, GNP PCP:  Donita Brooks, MD INSURANCE COVERAGE:  [employer] [place] / MEDICARE HEIGHT:  5'6" WEIGHT: 261 BMI: 42.15  SLEEP SYMPTOMS: Hypersomnia - ESS 12/24 Insomnia - not enough sleep Morning headaches  MEDICAL HISTORY:   Morbid Obesity with weight gain Headaches (sees Dr. Terrace Arabia) - on multiple medications without control Hypertension IBS DJD - left knee Depression Diabetes Mellitus GERD MEDICATIONS:MedicationsLong-Term   ALPRAZolam (XANAX) 0.5 MG tablet   cetirizine (ZYRTEC) 10 MG tablet   desvenlafaxine (PRISTIQ) 50 MG 24 hr tablet   docusate sodium (COLACE) 100 MG capsule   doxycycline (ADOXA) 50 MG tablet   fluticasone (FLONASE) 50 MCG/ACT nasal spray   gabapentin (NEURONTIN) 300 MG capsule   HYDROcodone-acetaminophen (NORCO/VICODIN) 5-325 MG per tablet   HYDROcodone-acetaminophen (VICODIN) 5-500 MG per tablet   Hypromellose (ARTIFICIAL TEARS OP)   metFORMIN (GLUCOPHAGE) 1000 MG tablet   omeprazole (PRILOSEC) 20 MG capsule   pravastatin (PRAVACHOL) 40 MG tablet   promethazine (PHENERGAN) 25 MG tablet   propranolol (INNOPRAN XL) 80 MG 24 hr capsule   SUMAtriptan (IMITREX) 100 MG tablet   topiramate (TOPAMAX) 50 MG tablet   traMADol (ULTRAM) 50 MG tablet   zolpidem (AMBIEN) 10 MG tablet    NOTES:    Significant risk for Obesity Hypoventilation syndrome, patient meets requirements for in lab attended study.

## 2012-06-16 NOTE — Telephone Encounter (Signed)
This patient qualifies for attended sleep study based on clinical history. Morbidly obese , DM, morning headaches.   Patient will need a SPLIT at AHI 10 and  3% scoring.  Offer lunesta prn,  no CO2 necessary.

## 2012-06-19 ENCOUNTER — Encounter: Payer: Self-pay | Admitting: Family Medicine

## 2012-06-19 ENCOUNTER — Ambulatory Visit (INDEPENDENT_AMBULATORY_CARE_PROVIDER_SITE_OTHER): Payer: BC Managed Care – PPO | Admitting: Family Medicine

## 2012-06-19 VITALS — BP 120/80 | HR 80 | Temp 98.3°F | Resp 18 | Wt 256.0 lb

## 2012-06-19 DIAGNOSIS — I10 Essential (primary) hypertension: Secondary | ICD-10-CM

## 2012-06-19 DIAGNOSIS — E119 Type 2 diabetes mellitus without complications: Secondary | ICD-10-CM

## 2012-06-19 NOTE — Progress Notes (Signed)
Subjective:    Patient ID: Heather Haley, female    DOB: Mar 15, 1960, 52 y.o.   MRN: [id number]  HPI Patient presents today with reportedly labile blood pressures. She states her blood pressure has been wildly varying as high as 160-180 systolically. Today in clinic she 120/80. My recheck after having her get on the table, she is 136/90. She denies any chest pain or shortness of breath. Review of blood pressures from the sports medicine clinic reveals a blood pressure slightly elevated at 143 systolic. At her last neurology appointment she was normal at 128/80.  She is overdue for a recheck of her diabetes. She is currently taking glipizide as well as metformin. She denies polyuria, polydipsia, blurred vision. Past Medical History  Diagnosis Date  . Hypertension   . IBS (irritable bowel syndrome)   . Postoperative nausea and vomiting   . Diabetes mellitus   . Left ankle instability   . Headache   . Left knee DJD   . Depression   . Frequency of urination   . GERD (gastroesophageal reflux disease)    Current Outpatient Prescriptions on File Prior to Visit  Medication Sig Dispense Refill  . ALPRAZolam (XANAX) 0.5 MG tablet Take 1 tablet (0.5 mg total) by mouth every 6 (six) hours as needed for sleep.  60 tablet  0  . cetirizine (ZYRTEC) 10 MG tablet Take 1 tablet (10 mg total) by mouth at bedtime.  30 tablet  11  . docusate sodium (COLACE) 100 MG capsule Take 200 mg by mouth 2 (two) times daily as needed. 200 mg every morning and if needed 200 mg in the evening      . fluticasone (FLONASE) 50 MCG/ACT nasal spray Place 2 sprays into the nose daily.       Marland Kitchen HYDROcodone-acetaminophen (NORCO/VICODIN) 5-325 MG per tablet TAKE ONE TABLET BY MOUTH EVERY 6 HOURS AS NEEDED FOR PAIN  30 tablet  0  . Hypromellose (ARTIFICIAL TEARS OP) Apply 2 drops to eye 4 (four) times daily as needed. Dry eye      . metFORMIN (GLUCOPHAGE) 1000 MG tablet Take 1,000 mg by mouth 2 (two) times daily with a meal.       . pravastatin (PRAVACHOL) 40 MG tablet Take 40 mg by mouth daily.      . promethazine (PHENERGAN) 25 MG tablet Take 25 mg by mouth every 6 (six) hours as needed. For nausea      . propranolol (INNOPRAN XL) 80 MG 24 hr capsule Take 80 mg by mouth at bedtime.       . SUMAtriptan (IMITREX) 100 MG tablet 1 po for acute migraine, may repeat once in 24 hours.  10 tablet  3  . topiramate (TOPAMAX) 50 MG tablet Take 1 tablet (50 mg total) by mouth 2 (two) times daily.  60 tablet  4  . traMADol (ULTRAM) 50 MG tablet Take 100 mg by mouth 3 (three) times daily.      Marland Kitchen zolpidem (AMBIEN) 10 MG tablet Take 1 tablet (10 mg total) by mouth at bedtime as needed. For sleep  30 tablet  2  . omeprazole (PRILOSEC) 20 MG capsule Take 20 mg by mouth daily.       No current facility-administered medications on file prior to visit.   Allergies  Allergen Reactions  . Codeine     REACTION: hives  . Nsaids     REACTION: palpitations, diaphoresis  . Penicillins     REACTION: upset stomach   History  Social History  . Marital Status: Divorced    Spouse Name: N/A    Number of Children: N/A  . Years of Education: N/A   Occupational History  . Not on file.   Social History Main Topics  . Smoking status: Former Smoker -- 2.00 packs/day for 15 years    Types: Cigarettes    Quit date: 02/09/1995  . Smokeless tobacco: Never Used  . Alcohol Use: No  . Drug Use: No  . Sexually Active: Not Currently   Other Topics Concern  . Not on file   Social History Narrative  . No narrative on file      Review of Systems  All other systems reviewed and are negative.       Objective:   Physical Exam  Constitutional: She appears well-developed and well-nourished.  Neck: Neck supple. No JVD present. No thyromegaly present.  Cardiovascular: Normal rate, regular rhythm and normal heart sounds.   No murmur heard. Pulmonary/Chest: Effort normal and breath sounds normal. No respiratory distress. She has no  wheezes. She has no rales.  Abdominal: Soft. Bowel sounds are normal.  Lymphadenopathy:    She has no cervical adenopathy.  Skin: Skin is warm and dry. No rash noted. No erythema. No pallor.          Assessment & Plan:  1. Type II or unspecified type diabetes mellitus without mention of complication, not stated as uncontrolled Recheck hemoglobin A1c today. A1c is less than 6.5 - Basic Metabolic Panel - Hemoglobin A1c  2. HTN (hypertension) Blood pressure is borderline. I do not see any indication that she needs more medications. I have asked her to try to decrease sodium in her diet. I've asked her to begin exercising regularly, and C5 10 pounds weight loss. I recommended she come in I could let us check her blood pressure for 40 should this not elevated.

## 2012-06-20 ENCOUNTER — Ambulatory Visit: Payer: BC Managed Care – PPO | Admitting: Family Medicine

## 2012-06-20 LAB — BASIC METABOLIC PANEL
BUN: 10 mg/dL (ref 6–23)
Calcium: 9.5 mg/dL (ref 8.4–10.5)
Chloride: 104 mEq/L (ref 96–112)
Creat: 0.82 mg/dL (ref 0.50–1.10)

## 2012-06-20 LAB — HEMOGLOBIN A1C: Mean Plasma Glucose: 128 mg/dL — ABNORMAL HIGH (ref <117)

## 2012-06-23 ENCOUNTER — Ambulatory Visit (INDEPENDENT_AMBULATORY_CARE_PROVIDER_SITE_OTHER): Payer: Medicare Other | Admitting: *Deleted

## 2012-06-23 DIAGNOSIS — G4733 Obstructive sleep apnea (adult) (pediatric): Secondary | ICD-10-CM

## 2012-06-23 DIAGNOSIS — G4710 Hypersomnia, unspecified: Secondary | ICD-10-CM

## 2012-06-23 DIAGNOSIS — R51 Headache: Secondary | ICD-10-CM

## 2012-06-23 DIAGNOSIS — G4730 Sleep apnea, unspecified: Secondary | ICD-10-CM

## 2012-06-24 NOTE — Progress Notes (Signed)
Sleep Technologist Summary Report ROOM #: 3 REFERRING MD:  Darrol Angel, [facility] - PCP is Priscille Heidelberg. Tanya Nones, MD READING MD: Melvyn Novas, MD  OBSERVATIONS: SPO2: reached a nadir of 85%, desaturations worsened during last hour of two of the night when patient seemed to be getting better sleep but worsening periods of hypopnea. CO2: ordered but not able to do as pediatric patient had higher CO2 priority EEG:  No significant abnormalities observed. EKG: no abnormalities observed EMG:  A few PLM's, more leg movement during wake was observed while pt was restless and trying to get comfortable. VIDEO:  Pt appears restless and stays asleep on one side or the other, went to the restroom 1X SLEEP STAGING: N1, N2, N3, REM questionnable - maybe only 1 minute of REM SNORE:  Snore was observed and audible but not loud RESPIRATORY:  Hypopnea observed, a few RERA, some desaturations and arousals associated.  Pt slept on her sides all night as is her ususal. CPAP: not used, pt didn't meet Split Night criteria, poor sleep quality, pt didn't bring her Zolpidem to take tonight.  ADDITIONAL COMMENTS/OBSERVATIONS FOR MEDICAL RECORD NUMBER Pt had extremely prolonged sleep onset latency and poor sleep efficiency, was not able to do Split Night for this patient because of this.  Pt also reported that she had pain in her ankle which is daily and keeps her from sleeping supine - did not request supine sleep because of her report of increased pain and problems walking the following day.  Pain is usually a factor in her ability to fall asleep and her ability to sleep.  Patient reports she uses Ambien approximately 3x per month and usually only when the pain is more severe and she is having trouble controlling it with her narcotic medication.

## 2012-07-06 ENCOUNTER — Encounter: Payer: Self-pay | Admitting: *Deleted

## 2012-07-06 ENCOUNTER — Other Ambulatory Visit: Payer: Self-pay | Admitting: *Deleted

## 2012-07-06 ENCOUNTER — Telehealth: Payer: Self-pay | Admitting: *Deleted

## 2012-07-06 DIAGNOSIS — G4733 Obstructive sleep apnea (adult) (pediatric): Secondary | ICD-10-CM

## 2012-07-06 NOTE — Telephone Encounter (Signed)
Called patient to discuss sleep study results from 06/20/2012 .  Discussed findings, recommendations and follow up care.  Patient understood well and all questions were answered.    CPAP Titration appt scheduled for Wednesday, June 11th at 8:00 PM, pt is aware and knows to bring her Ambien and anything that will help her feel comfortable here in the sleep lab.  Results were sent to MD's 07/05/2012.  Results will be mailed with follow up letter to patient's home. -sh

## 2012-07-10 ENCOUNTER — Other Ambulatory Visit: Payer: Self-pay | Admitting: Family Medicine

## 2012-07-10 NOTE — Telephone Encounter (Signed)
?   OK to Refill  

## 2012-07-10 NOTE — Telephone Encounter (Signed)
Ok to refill 

## 2012-07-13 ENCOUNTER — Telehealth: Payer: Self-pay | Admitting: Neurology

## 2012-07-13 NOTE — Telephone Encounter (Addendum)
Pt called upset because she thought that her appt was 07/12/2012 but it was actually 07/19/2012.  She says that it was explained to her that the test was fine so why does she have to return for another study.  Advised the patient that I would send notification about her desire not to repeat the study but I would not cancel the appt until she has had more time to consider.   07/25/2012 -Pt has decided against returning for a sleep study.

## 2012-07-17 ENCOUNTER — Other Ambulatory Visit: Payer: Self-pay | Admitting: Family Medicine

## 2012-07-17 NOTE — Telephone Encounter (Signed)
?   OK to Refill  

## 2012-07-17 NOTE — Telephone Encounter (Signed)
Ok to refill 

## 2012-07-19 ENCOUNTER — Other Ambulatory Visit: Payer: Self-pay | Admitting: Family Medicine

## 2012-07-30 ENCOUNTER — Other Ambulatory Visit: Payer: Self-pay | Admitting: Family Medicine

## 2012-07-31 ENCOUNTER — Encounter: Payer: Self-pay | Admitting: Family Medicine

## 2012-07-31 ENCOUNTER — Ambulatory Visit (INDEPENDENT_AMBULATORY_CARE_PROVIDER_SITE_OTHER): Payer: BC Managed Care – PPO | Admitting: Family Medicine

## 2012-07-31 VITALS — BP 110/78 | HR 68 | Temp 97.7°F | Resp 18 | Ht 65.5 in | Wt 255.0 lb

## 2012-07-31 DIAGNOSIS — J209 Acute bronchitis, unspecified: Secondary | ICD-10-CM

## 2012-07-31 MED ORDER — AZITHROMYCIN 250 MG PO TABS: ORAL_TABLET | ORAL | Status: DC

## 2012-07-31 MED ORDER — BENZONATATE 100 MG PO CAPS: 100.0000 mg | ORAL_CAPSULE | Freq: Two times a day (BID) | ORAL | Status: DC | PRN

## 2012-07-31 NOTE — Progress Notes (Signed)
  Subjective:    Patient ID: Heather Haley, female    DOB: Dec 31, 1960, 52 y.o.   MRN: [id number]  HPI  Patient presents with cough with production for the past 3 weeks. She's been taking NyQuil and other over-the-counter medications such as Tussend with minimal improvement. She had fever which broke a week ago. She died denies any shortness of breath or chest pain. She does admit to some soreness in her left side after she coughs. She has no history of COPD or chronic bronchitis she is a nonsmoker with a remote history of smoking.  Review of Systems   GEN- denies fatigue, fever, weight loss,weakness, recent illness HEENT- denies eye drainage, change in vision, nasal discharge, +sore throat CVS- denies chest pain, palpitations RESP- denies SOB, +cough, wheeze ABD- denies N/V, change in stools, abd pain Neuro- denies headache, dizziness, syncope, seizure activity      Objective:   Physical Exam GEN- NAD, alert and oriented x3 HEENT- PERRL, EOMI, non injected sclera, pink conjunctiva, MMM, oropharynx clear, TM clear bilat no effusion, no maxillary sinus tenderness, inflammed turbinates,  Nasal drainage  Neck- Supple, shotty  LAD CVS- RRR, no murmur RESP-clear with upper airway congestion  EXT- No edema Pulses- Radial 2+         Assessment & Plan:

## 2012-07-31 NOTE — Patient Instructions (Addendum)
If you can take Plain mucinex twice a day  Take the cough perrle  Zpak as directed Call if you do not improve

## 2012-08-01 DIAGNOSIS — J209 Acute bronchitis, unspecified: Secondary | ICD-10-CM | POA: Insufficient documentation

## 2012-08-01 NOTE — Assessment & Plan Note (Signed)
Zpak, mucniex, cough meds Fluids Treated based on duration and worsening of symptoms

## 2012-08-03 ENCOUNTER — Telehealth: Payer: Self-pay | Admitting: Family Medicine

## 2012-08-07 ENCOUNTER — Telehealth: Payer: Self-pay | Admitting: Family Medicine

## 2012-08-08 NOTE — Telephone Encounter (Signed)
?   OK to Refill  

## 2012-08-08 NOTE — Telephone Encounter (Signed)
Approved. # 30 / 0 refill 

## 2012-08-09 ENCOUNTER — Encounter: Payer: Self-pay | Admitting: Family Medicine

## 2012-08-09 MED ORDER — HYDROCODONE-ACETAMINOPHEN 5-325 MG PO TABS: ORAL_TABLET | ORAL | Status: DC

## 2012-08-09 NOTE — Telephone Encounter (Signed)
Med phoned in °

## 2012-08-10 NOTE — Telephone Encounter (Signed)
This encounter was created in error - please disregard.

## 2012-08-14 ENCOUNTER — Telehealth: Payer: Self-pay | Admitting: Family Medicine

## 2012-08-14 MED ORDER — AZITHROMYCIN 250 MG PO TABS: ORAL_TABLET | ORAL | Status: DC

## 2012-08-14 NOTE — Telephone Encounter (Signed)
rx sent and .Patient aware per vm  

## 2012-08-14 NOTE — Telephone Encounter (Signed)
Please call in z pack 

## 2012-08-22 ENCOUNTER — Ambulatory Visit (INDEPENDENT_AMBULATORY_CARE_PROVIDER_SITE_OTHER): Payer: BC Managed Care – PPO | Admitting: Neurology

## 2012-08-22 ENCOUNTER — Encounter: Payer: Self-pay | Admitting: Neurology

## 2012-08-22 VITALS — BP 134/84 | HR 75 | Ht 62.5 in | Wt 253.0 lb

## 2012-08-22 DIAGNOSIS — M25579 Pain in unspecified ankle and joints of unspecified foot: Secondary | ICD-10-CM

## 2012-08-22 DIAGNOSIS — F32A Depression, unspecified: Secondary | ICD-10-CM

## 2012-08-22 DIAGNOSIS — R51 Headache: Secondary | ICD-10-CM

## 2012-08-22 DIAGNOSIS — F329 Major depressive disorder, single episode, unspecified: Secondary | ICD-10-CM

## 2012-08-22 DIAGNOSIS — F3289 Other specified depressive episodes: Secondary | ICD-10-CM

## 2012-08-22 DIAGNOSIS — M25572 Pain in left ankle and joints of left foot: Secondary | ICD-10-CM

## 2012-08-22 MED ORDER — TOPIRAMATE 100 MG PO TABS: 50.0000 mg | ORAL_TABLET | Freq: Two times a day (BID) | ORAL | Status: DC

## 2012-08-22 MED ORDER — SUMATRIPTAN SUCCINATE 100 MG PO TABS: ORAL_TABLET | ORAL | Status: DC

## 2012-08-22 NOTE — Progress Notes (Signed)
History of Present Illness:   Heather Haley is a 52 years old right-handed Caucasian female, referred by her primary care physician Dr. Kerby Nora for evaluation of headaches  She had a past medical history of depression, obesity, diabetes, hypertension, hyperlipidemia, had multiple left ankle surgery, most recent surgery was in 2009, with chronic left ankle pain, wearing left ankle brace, left knee replacement July 2013, complicated by left lower extremity DVT, was temporarily treated with Coumadin.  She denies a previous history of headaches, over the past 3 months, she had almost daily headaches, woke up with a left temporoparietal area pressure sensation, getting worse as day goes by, by the evening time, it is often exacerbated to moderate to severe pounding headaches, with associated light sensitivity, worsening by movement, she has tried Tylenol, tramadol, without much help,  She was put on gabapentin 600 mg 3 times a day, with mild help, she reports 20 pounds weight gain over past 2 months, there are some daytime fatigue, and sleepiness, Epworth sleeping scale is 8 today, she denies excessive snoring,   UPDATE July 15th 2014:  She continues to have frequent headaches, this started about 6 months ago, getting worse since June, she has 1-2 headaches each week, left temporal region dull achy pain, with left facial pain, numbness, lasting for few hours, Imitrex was helping within 30 minutes.  She has associated blurry vision, staggering during headaches.  She had sleep study,: reached a nadir of 85%, desaturations worsened during last hour of two of the night when patient seemed to be getting better sleep but worsening periods of hypopnea. But she did not return for titration test.  She continued to have left ankle pain, wear left ankle bracelet   Physical Exam  Neck: supple no carotid bruits Respiratory: clear to auscultation bilaterally Cardiovascular: regular rate rhythm  Neurologic Exam  Mental  Status: pleasant, awake, alert, cooperative to history, talking, and casual conversation. Cranial Nerves: CN II-XII pupils were equal round reactive to light.  Fundi were sharp bilaterally.  Extraocular movements were full.  Visual fields were full on confrontational test.  Facial sensation and strength were normal.  Hearing was intact to finger rubbing bilaterally.  Uvula tongue were midline.  Head turning and shoulder shrugging were normal and symmetric.  Tongue protrusion into the cheeks strength were normal.  Motor: Normal tone, bulk, and strength, left ankle lateral surgical scar. Sensory: Normal to light touch, pinprick, proprioception, and vibratory sensation. Coordination: Normal finger-to-nose, heel-to-shin.  There was no dysmetria noticed. Gait and Station: wear left ankle brace, mildly limp.  Romberg sign: Negative Reflexes: Deep tendon reflexes: Biceps: 2/2, Brachioradialis: 2/2, Triceps: 2/2, Pateller: 2/2, Achilles: 2/2.  Plantar responses are flexor.   Assessment and Plan:    52 years old Caucasian female, presenting with new onset headaches, normal neurological examination, and normal CT scan,  1. Her headache has migraine features 2. Increase preventive medication topiramate 100mg  twice a day. 3. Imitrex as needed 4. Return to clinic in 4-5 month with Eber Jones,

## 2012-08-24 NOTE — Telephone Encounter (Signed)
Completed.

## 2012-08-28 ENCOUNTER — Telehealth: Payer: Self-pay | Admitting: Neurology

## 2012-08-28 NOTE — Telephone Encounter (Signed)
I called and spoke with the patient and informed her that per Dr. Zannie Cove note she is to take Topamax 100 mg twice daily. Patient understood.

## 2012-08-28 NOTE — Telephone Encounter (Signed)
Spoke with patient and told her 100 mg twice daily.

## 2012-08-31 ENCOUNTER — Other Ambulatory Visit: Payer: Self-pay | Admitting: Physician Assistant

## 2012-08-31 NOTE — Telephone Encounter (Signed)
Need approval for controlled medication.  Last refill 08/09/12  #30

## 2012-08-31 NOTE — Telephone Encounter (Signed)
Ok to refill 09/08/12

## 2012-09-01 ENCOUNTER — Other Ambulatory Visit: Payer: Self-pay | Admitting: Physician Assistant

## 2012-09-02 ENCOUNTER — Other Ambulatory Visit: Payer: Self-pay | Admitting: Family Medicine

## 2012-09-07 ENCOUNTER — Other Ambulatory Visit: Payer: Self-pay | Admitting: Family Medicine

## 2012-09-08 NOTE — Telephone Encounter (Signed)
rx called in

## 2012-09-11 ENCOUNTER — Other Ambulatory Visit: Payer: Self-pay | Admitting: Family Medicine

## 2012-09-12 ENCOUNTER — Encounter: Payer: Self-pay | Admitting: Sports Medicine

## 2012-09-12 ENCOUNTER — Ambulatory Visit (INDEPENDENT_AMBULATORY_CARE_PROVIDER_SITE_OTHER): Payer: Worker's Compensation | Admitting: Sports Medicine

## 2012-09-12 VITALS — BP 142/93 | HR 108 | Ht 62.5 in | Wt 253.0 lb

## 2012-09-12 DIAGNOSIS — M2170 Unequal limb length (acquired), unspecified site: Secondary | ICD-10-CM | POA: Insufficient documentation

## 2012-09-12 DIAGNOSIS — M25572 Pain in left ankle and joints of left foot: Secondary | ICD-10-CM

## 2012-09-12 DIAGNOSIS — M25579 Pain in unspecified ankle and joints of unspecified foot: Secondary | ICD-10-CM

## 2012-09-12 NOTE — Assessment & Plan Note (Signed)
Continue using the orthotic with a lift.  Without this correction you will put more stress on her left ankle.

## 2012-09-12 NOTE — Patient Instructions (Addendum)
Start doing balance exercise on lt  Stand near table to hold on to as needed- practice balancing on lt foot for 5-10 seconds repeat 5 times  Continue ankle compression sleeves - x brace during the day and full ankle sleeve at night  Let us know if Dr. Victorino Dike suggests other meds for pain or other treatments  Thank you for seeing [place] today!

## 2012-09-12 NOTE — Progress Notes (Signed)
Patient ID: Heather Haley, female   DOB: [birth year], 52 y.o.   MRN: [id number]  Workman's comp injury with chronic left ankle weakness No significant change from last visit. Using ankle x-brace during the day, and full ankle sleeve in the evenings. Ankle feels weak and unstable when she has to get up at night. Taking tramadol and vicodin for pain. Was taking vicodin 3 hours before bedtime, rx'd by Dr. Tanya Nones.  Pt was starting to have itching when she took vicodin. Dr. Tanya Nones does not want to continue prescribing vicodin.  She states that she simply does not get enough relief from tramadol. She does not take the Ambien or the Xanax at night unless she cannot sleep. She does feel that the foot pain wakens her from sleep  She is to see Dr. Earlie Counts tomorrow for an orthopeidic foot specialty evaluation     Physical exam: Obese white female who is alert and in no acute distress  Lt ankle exam:  Scar down lateral mall on lt Less swelling in peroneal area Slight swelling in sinus tarsi Normal inversion Limited eversion Drawer is stable but uncomfortable  L foot exam: Hallux rigidus L great toe Subluxation of 5th MTP Loss of transverse arch  Rt leg 2 cm shorter than lt (has a large correction in custom orthotics)  Midline scar in the left knee from previous total knee replacement No warmth or redness noted and this feels stable

## 2012-09-12 NOTE — Assessment & Plan Note (Addendum)
I am not sure that we have other options to offer  Compression sleeves do seem to help her ankle  We did modify metatarsal pad placement of her left orthotic insole  Continue ankle range of motion but also began some balance exercises  We did not renew or change any of her medications. Dr. Tanya Nones may not want her using a certain combination of medications so we will defer to the orthopedist and he feel are most helpful

## 2012-09-18 ENCOUNTER — Encounter: Payer: Self-pay | Admitting: Family Medicine

## 2012-09-18 ENCOUNTER — Ambulatory Visit (INDEPENDENT_AMBULATORY_CARE_PROVIDER_SITE_OTHER): Payer: BC Managed Care – PPO | Admitting: Family Medicine

## 2012-09-18 VITALS — BP 100/68 | HR 72 | Temp 97.9°F | Resp 18 | Wt 251.0 lb

## 2012-09-18 DIAGNOSIS — B079 Viral wart, unspecified: Secondary | ICD-10-CM

## 2012-09-18 NOTE — Progress Notes (Signed)
Subjective:    Patient ID: Heather Haley, female    DOB: [birth year], 52 y.o.   MRN: [id number]  HPI Patient reports a lesion on the tip of her right index finger which has been steadily growing over the last month or so. It is just proximal to the nailbed.  It has the characteristics of a wart versus a small ganglion cyst. It is approximately 4 mm in size.  It is nontender. It does not bleed. Patient denies any drainage from the papule. Past Medical History  Diagnosis Date  . Hypertension   . IBS (irritable bowel syndrome)   . Postoperative nausea and vomiting   . Diabetes mellitus   . Left ankle instability   . Headache(784.0)   . Left knee DJD   . Depression   . Frequency of urination   . GERD (gastroesophageal reflux disease)    Current Outpatient Prescriptions on File Prior to Visit  Medication Sig Dispense Refill  . ALPRAZolam (XANAX) 0.5 MG tablet Take 1 tablet (0.5 mg total) by mouth every 6 (six) hours as needed for sleep.  60 tablet  0  . cetirizine (ZYRTEC) 10 MG tablet Take 1 tablet (10 mg total) by mouth at bedtime.  30 tablet  11  . fluticasone (FLONASE) 50 MCG/ACT nasal spray USE 2 SPRAYS IN EACH NOSTRIL EVERY DAY  1 g  2  . glipiZIDE (GLUCOTROL XL) 5 MG 24 hr tablet TAKE 1 TABLET BY MOUTH ONCE A DAY **STOP JANUVIA**  30 tablet  3  . HYDROcodone-acetaminophen (NORCO/VICODIN) 5-325 MG per tablet TAKE 1 TABLET BY MOUTH EVERY 6 HOURS AS NEEDED  30 tablet  0  . Hypromellose (ARTIFICIAL TEARS OP) Apply 2 drops to eye 4 (four) times daily as needed. Dry eye      . metFORMIN (GLUCOPHAGE) 1000 MG tablet Take 1,000 mg by mouth 2 (two) times daily with a meal.      . omeprazole (PRILOSEC) 20 MG capsule Take 20 mg by mouth daily.      . pravastatin (PRAVACHOL) 80 MG tablet TAKE 1 TABLET BY MOUTH ONCE A DAY AT BEDTIME  30 tablet  5  . promethazine (PHENERGAN) 25 MG tablet TAKE 1 TABLET BY MOUTH EVERY 6 HOURS AS NEEDED FOR NAUSEA  20 tablet  1  . propranolol ER (INDERAL LA) 80 MG  24 hr capsule TAKE ONE CAPSULE EVERY DAY  30 capsule  5  . SUMAtriptan (IMITREX) 100 MG tablet 1 po for acute migraine, may repeat once in 24 hours.  15 tablet  12  . topiramate (TOPAMAX) 100 MG tablet Take 0.5 tablets (50 mg total) by mouth 2 (two) times daily.  60 tablet  12  . traMADol (ULTRAM) 50 MG tablet TAKE TWO TABLETS BY MOUTH THREE TIMES DAILY AS NEEDED FOR PAIN  180 tablet  0  . venlafaxine XR (EFFEXOR-XR) 150 MG 24 hr capsule TAKE 1 CAPSULE EVERY DAY  30 capsule  3  . zolpidem (AMBIEN) 10 MG tablet Take 1 tablet (10 mg total) by mouth at bedtime as needed. For sleep  30 tablet  2   No current facility-administered medications on file prior to visit.   Allergies  Allergen Reactions  . Codeine     REACTION: hives  . Nsaids     REACTION: palpitations, diaphoresis  . Penicillins     REACTION: upset stomach   History   Social History  . Marital Status: Divorced    Spouse Name: N/A    Number  of Children: 2  . Years of Education: 11   Occupational History  .  [employer]   Social History Main Topics  . Smoking status: Former Smoker -- 2.00 packs/day for 15 years    Types: Cigarettes    Quit date: 02/09/1995  . Smokeless tobacco: Never Used  . Alcohol Use: No  . Drug Use: No  . Sexually Active: Not Currently   Other Topics Concern  . Not on file   Social History Narrative   Patient lives at home alone. Patient works at [employer].   Caffeine daily- 2   Right handed.      Review of Systems  All other systems reviewed and are negative.       Objective:   Physical Exam  Vitals reviewed. Cardiovascular: Normal rate and regular rhythm.   Pulmonary/Chest: Effort normal and breath sounds normal.  Skin: Skin is warm. No erythema.   4 mm wart versus ganglion cyst on the right index finger proximal to the nailbed.        Assessment & Plan:  1. Wart The lesion is so small as hard to differentiate at present. It feels more like a wart. Therefore we  treated with liquid nitrogen and cryotherapy. The lesion was treated for 20 seconds. If it persists greater than 4 weeks I recommend the patient return for a recheck.

## 2012-09-23 ENCOUNTER — Other Ambulatory Visit: Payer: Self-pay | Admitting: Family Medicine

## 2012-10-05 ENCOUNTER — Other Ambulatory Visit: Payer: Self-pay | Admitting: Physician Assistant

## 2012-10-05 ENCOUNTER — Other Ambulatory Visit: Payer: Self-pay | Admitting: Family Medicine

## 2012-10-05 ENCOUNTER — Other Ambulatory Visit: Payer: Self-pay

## 2012-10-05 MED ORDER — TOPIRAMATE 100 MG PO TABS: 100.0000 mg | ORAL_TABLET | Freq: Two times a day (BID) | ORAL | Status: DC

## 2012-10-05 NOTE — Telephone Encounter (Signed)
Ok to refill 

## 2012-10-05 NOTE — Telephone Encounter (Signed)
OV note says: 2. Increase preventive medication topiramate 100mg  twice a day

## 2012-10-06 NOTE — Telephone Encounter (Signed)
ok 

## 2012-10-06 NOTE — Telephone Encounter (Signed)
rx called in

## 2012-10-13 ENCOUNTER — Ambulatory Visit (INDEPENDENT_AMBULATORY_CARE_PROVIDER_SITE_OTHER): Payer: BC Managed Care – PPO | Admitting: Family Medicine

## 2012-10-13 ENCOUNTER — Encounter: Payer: Self-pay | Admitting: Family Medicine

## 2012-10-13 VITALS — BP 110/78 | HR 78 | Temp 98.1°F | Resp 18 | Wt 249.0 lb

## 2012-10-13 DIAGNOSIS — H60392 Other infective otitis externa, left ear: Secondary | ICD-10-CM

## 2012-10-13 DIAGNOSIS — L659 Nonscarring hair loss, unspecified: Secondary | ICD-10-CM

## 2012-10-13 DIAGNOSIS — H60399 Other infective otitis externa, unspecified ear: Secondary | ICD-10-CM

## 2012-10-13 MED ORDER — NEOMYCIN-POLYMYXIN-HC 1 % OT SOLN: 3.0000 [drp] | Freq: Four times a day (QID) | OTIC | Status: DC

## 2012-10-13 NOTE — Progress Notes (Signed)
Subjective:    Patient ID: Heather Haley, female    DOB: 1960-03-02, 52 y.o.   MRN: [id number]  HPI Patient has a one-week history of pain in her left ear canal. It is gradually worsening and is now hurting into her left jaw. She denies any hearing loss, rhinorrhea, sneezing, sinus pain, cough, fever. The ear canal itself is normal in and itchy. It hurts to put on her ear.  She is also complaining of thinning hair and hair loss on the crown of her head.   Past Medical History  Diagnosis Date  . Hypertension   . IBS (irritable bowel syndrome)   . Postoperative nausea and vomiting   . Diabetes mellitus   . Left ankle instability   . Headache(784.0)   . Left knee DJD   . Depression   . Frequency of urination   . GERD (gastroesophageal reflux disease)    Current Outpatient Prescriptions on File Prior to Visit  Medication Sig Dispense Refill  . ALPRAZolam (XANAX) 0.5 MG tablet Take 1 tablet (0.5 mg total) by mouth every 6 (six) hours as needed for sleep.  60 tablet  0  . cetirizine (ZYRTEC) 10 MG tablet Take 1 tablet (10 mg total) by mouth at bedtime.  30 tablet  11  . fluticasone (FLONASE) 50 MCG/ACT nasal spray USE 2 SPRAYS IN EACH NOSTRIL EVERY DAY  1 g  2  . glipiZIDE (GLUCOTROL XL) 5 MG 24 hr tablet TAKE 1 TABLET BY MOUTH ONCE A DAY **STOP JANUVIA**  30 tablet  3  . HYDROcodone-acetaminophen (NORCO/VICODIN) 5-325 MG per tablet TAKE 1 TABLET BY MOUTH EVERY 6 HOURS AS NEEDED  30 tablet  0  . Hypromellose (ARTIFICIAL TEARS OP) Apply 2 drops to eye 4 (four) times daily as needed. Dry eye      . lubiprostone (AMITIZA) 24 MCG capsule Take 24 mcg by mouth 2 (two) times daily with a meal.      . metFORMIN (GLUCOPHAGE) 1000 MG tablet TAKE 1 TABLET TWICE DAILY  60 tablet  5  . omeprazole (PRILOSEC) 20 MG capsule Take 20 mg by mouth daily.      . pravastatin (PRAVACHOL) 80 MG tablet TAKE 1 TABLET BY MOUTH ONCE A DAY AT BEDTIME  30 tablet  5  . promethazine (PHENERGAN) 25 MG tablet TAKE 1  TABLET BY MOUTH EVERY 6 HOURS AS NEEDED FOR NAUSEA  20 tablet  1  . propranolol ER (INDERAL LA) 80 MG 24 hr capsule TAKE ONE CAPSULE EVERY DAY  30 capsule  5  . SUMAtriptan (IMITREX) 100 MG tablet 1 po for acute migraine, may repeat once in 24 hours.  15 tablet  12  . topiramate (TOPAMAX) 100 MG tablet Take 1 tablet (100 mg total) by mouth 2 (two) times daily.  60 tablet  12  . traMADol (ULTRAM) 50 MG tablet TAKE 2 TABLETS BY MOUTH THREE TIMES DAILY AS NEEDED FOR PAIN  180 tablet  0  . venlafaxine XR (EFFEXOR-XR) 150 MG 24 hr capsule TAKE 1 CAPSULE EVERY DAY  30 capsule  3  . zolpidem (AMBIEN) 10 MG tablet Take 1 tablet (10 mg total) by mouth at bedtime as needed. For sleep  30 tablet  2   No current facility-administered medications on file prior to visit.   Allergies  Allergen Reactions  . Codeine     REACTION: hives  . Nsaids     REACTION: palpitations, diaphoresis  . Penicillins     REACTION: upset stomach  History   Social History  . Marital Status: Divorced    Spouse Name: N/A    Number of Children: 2  . Years of Education: 11   Occupational History  .  [employer]   Social History Main Topics  . Smoking status: Former Smoker -- 2.00 packs/day for 15 years    Types: Cigarettes    Quit date: 02/09/1995  . Smokeless tobacco: Never Used  . Alcohol Use: No  . Drug Use: No  . Sexual Activity: Not Currently   Other Topics Concern  . Not on file   Social History Narrative   Patient lives at home alone. Patient works at [employer].   Caffeine daily- 2   Right handed.      Review of Systems  All other systems reviewed and are negative.       Objective:   Physical Exam  Vitals reviewed. Constitutional: She appears well-developed and well-nourished.  HENT:  Right Ear: There is drainage, swelling and tenderness. Tympanic membrane is not injected, not scarred, not perforated, not erythematous, not retracted and not bulging. Tympanic membrane mobility is  normal. No middle ear effusion.  Nose: Nose normal.  Mouth/Throat: Oropharynx is clear and moist. No oropharyngeal exudate.  Eyes: Pupils are equal, round, and reactive to light. Left eye exhibits no discharge.  Neck: Neck supple. No thyromegaly present.  Cardiovascular: Normal rate, regular rhythm and normal heart sounds.   Pulmonary/Chest: Effort normal and breath sounds normal. No respiratory distress. She has no wheezes. She has no rales.  Abdominal: Soft. Bowel sounds are normal.   Patient has thinning hair on the crown of her head in the center. This is in an androgenic pattern.       Assessment & Plan:  1. Otitis, externa, infective, left Use Cortisporin Otic drops 3 drops in the left ear canal 4 times a day for 7 days. - NEOMYCIN-POLYMYXIN-HYDROCORTISONE (CORTISPORIN) 1 % SOLN otic solution; Place 3 drops into the left ear every 6 (six) hours.  Dispense: 10 mL; Refill: 0  2. Alopecia I suspect androgenic alopecia. Check a TSH to rule out hypothyroidism. Meanwhile patient can use Rogaine for Women over-the-counter. - TSH

## 2012-10-14 LAB — TSH: TSH: 2.252 u[IU]/mL (ref 0.350–4.500)

## 2012-10-25 ENCOUNTER — Encounter: Payer: Self-pay | Admitting: Family Medicine

## 2012-10-25 ENCOUNTER — Telehealth: Payer: Self-pay | Admitting: Family Medicine

## 2012-10-25 MED ORDER — GLIPIZIDE ER 5 MG PO TB24: ORAL_TABLET | ORAL | Status: DC

## 2012-10-25 MED ORDER — FLUTICASONE PROPIONATE 50 MCG/ACT NA SUSP: NASAL | Status: DC

## 2012-10-25 MED ORDER — METFORMIN HCL 1000 MG PO TABS: ORAL_TABLET | ORAL | Status: DC

## 2012-10-25 NOTE — Telephone Encounter (Signed)
Fluticasone Prop 50 mcg spray 2 sprays each nostril QD (90 day supply) Metformin HCL 1000 mg tab 1 BID #180 Glipizide ER 5 mg tab 1 QD #90

## 2012-10-25 NOTE — Telephone Encounter (Signed)
Rx Refilled  

## 2012-10-31 ENCOUNTER — Ambulatory Visit (INDEPENDENT_AMBULATORY_CARE_PROVIDER_SITE_OTHER): Payer: Worker's Compensation | Admitting: Sports Medicine

## 2012-10-31 ENCOUNTER — Encounter: Payer: Self-pay | Admitting: Sports Medicine

## 2012-10-31 VITALS — BP 123/79 | HR 80 | Ht 62.5 in | Wt 249.0 lb

## 2012-10-31 DIAGNOSIS — M25579 Pain in unspecified ankle and joints of unspecified foot: Secondary | ICD-10-CM

## 2012-10-31 DIAGNOSIS — M25572 Pain in left ankle and joints of left foot: Secondary | ICD-10-CM

## 2012-10-31 MED ORDER — NORTRIPTYLINE HCL 25 MG PO CAPS: 25.0000 mg | ORAL_CAPSULE | Freq: Every day | ORAL | Status: DC

## 2012-10-31 NOTE — Progress Notes (Signed)
  Subjective:    Patient ID: Heather Haley, female    DOB: [birth year], 52 y.o.   MRN: [id number]  HPI  Pt presents to clinic for f/u of left ankle pain which is still very bothersome. She is taking vicodin as needed for severe pain, and tramadol 100 mg three times per day which does not seem helpful. She pinpoints an area of hypersensitivity in peroneal tendon area on lt.  Brought in pictures that showed extensive swelling at scar over lateral left ankle. Has been wearing compression full ankle sleeve and x-brace as tolerated, but unable to wear when she has increased swelling 2/2 pain No new injury  In the past we tried amitriptyline which kept her awake She was also on gabapentin at one time and I am not sure whether she had benefit   Review of Systems     Objective:   Physical Exam No acute distress, obese  Lt ankle:  Limited motion on inversion and eversion Mild limitation on plantar flexion Good dorsiflexion There is less swelling today than usual Ligaments are stable She is hypersensitive to touch or any pressure just behind the lateral malleolus and along her surgical scar       Assessment & Plan:

## 2012-10-31 NOTE — Assessment & Plan Note (Signed)
She will continue the AFO brace Continue her orthotics  We will have her use the compression sleeves when she is able to tolerate them  Trial with nortriptyline 25 at night as I am concerned she has some component of complex regional pain syndrome  Tried topical Capsin  Try this protocol for 6 weeks and then return for reevaluation

## 2012-10-31 NOTE — Patient Instructions (Addendum)
Try topical capsaicin cream on your ankle, this is over the counter Make sure to wash hands right after application of cream as it is made from red pepers  Start nortriptyline 25 mg at bedtime  Please follow up in 6 weeks  Thank you for seeing [place] today!

## 2012-11-01 ENCOUNTER — Other Ambulatory Visit: Payer: Self-pay | Admitting: Family Medicine

## 2012-11-08 ENCOUNTER — Other Ambulatory Visit: Payer: Self-pay | Admitting: Family Medicine

## 2012-11-08 NOTE — Telephone Encounter (Signed)
.?   OK to Refill Last refill -  10/05/2012

## 2012-11-09 NOTE — Telephone Encounter (Signed)
ok 

## 2012-11-10 ENCOUNTER — Encounter: Payer: Self-pay | Admitting: Family Medicine

## 2012-11-10 ENCOUNTER — Ambulatory Visit
Admission: RE | Admit: 2012-11-10 | Discharge: 2012-11-10 | Disposition: A | Discharge status: Home / Self Care | Payer: BC Managed Care – PPO | Source: Ambulatory Visit | Attending: Family Medicine | Admitting: Family Medicine

## 2012-11-10 ENCOUNTER — Ambulatory Visit (INDEPENDENT_AMBULATORY_CARE_PROVIDER_SITE_OTHER): Payer: BC Managed Care – PPO | Admitting: Family Medicine

## 2012-11-10 VITALS — BP 98/70 | HR 84 | Temp 98.1°F | Resp 18 | Ht 62.5 in | Wt 246.0 lb

## 2012-11-10 DIAGNOSIS — R109 Unspecified abdominal pain: Secondary | ICD-10-CM

## 2012-11-10 DIAGNOSIS — Z23 Encounter for immunization: Secondary | ICD-10-CM

## 2012-11-10 NOTE — Progress Notes (Signed)
Subjective:    Patient ID: Heather Haley, female    DOB: Jan 14, 1961, 52 y.o.   MRN: [id number]  HPI  Patient presents with several weeks of lower pelvic pain. The pain is located in the right lower quadrant and the left lower quadrant. It is colicky in nature. It feels like a cramp or spasm. She has had severe constipation recently. She is typically going through to 4 days without a bowel movement. Recently she went almost 2 weeks without a bowel movement. She denies any melena or hematochezia. She denies any nausea vomiting or diarrhea. She reports subjective fevers. She denies any hematuria or dysuria. She denies any GERD-like symptoms.  She has a history of a total hysterectomy. Past Medical History  Diagnosis Date  . Hypertension   . IBS (irritable bowel syndrome)   . Postoperative nausea and vomiting   . Diabetes mellitus   . Left ankle instability   . Headache(784.0)   . Left knee DJD   . Depression   . Frequency of urination   . GERD (gastroesophageal reflux disease)    Past Surgical History  Procedure Laterality Date  . Ankle surgery    . Abdominal hysterectomy    . Knee arthroscopy  1997    left knee  . Total knee arthroplasty  08/30/2011    Procedure: TOTAL KNEE ARTHROPLASTY;  Surgeon: Nilda Simmer, MD;  Location: [facility] OR;  Service: Orthopedics;  Laterality: Left;  . Colonoscopy  2013   Current Outpatient Prescriptions on File Prior to Visit  Medication Sig Dispense Refill  . ALPRAZolam (XANAX) 0.5 MG tablet Take 1 tablet (0.5 mg total) by mouth every 6 (six) hours as needed for sleep.  60 tablet  0  . cetirizine (ZYRTEC) 10 MG tablet Take 1 tablet (10 mg total) by mouth at bedtime.  30 tablet  11  . fluticasone (FLONASE) 50 MCG/ACT nasal spray USE 2 SPRAYS IN EACH NOSTRIL EVERY DAY  16 g  2  . glipiZIDE (GLUCOTROL XL) 5 MG 24 hr tablet TAKE 1 TABLET BY MOUTH ONCE A DAY  90 tablet  1  . HYDROcodone-acetaminophen (NORCO/VICODIN) 5-325 MG per tablet TAKE 1 TABLET BY  MOUTH EVERY 6 HOURS AS NEEDED  30 tablet  0  . Hypromellose (ARTIFICIAL TEARS OP) Apply 2 drops to eye 4 (four) times daily as needed. Dry eye      . metFORMIN (GLUCOPHAGE) 1000 MG tablet TAKE 1 TABLET TWICE DAILY  180 tablet  1  . NEOMYCIN-POLYMYXIN-HYDROCORTISONE (CORTISPORIN) 1 % SOLN otic solution Place 3 drops into the left ear every 6 (six) hours.  10 mL  0  . nortriptyline (PAMELOR) 25 MG capsule Take 1 capsule (25 mg total) by mouth at bedtime.  30 capsule  1  . omeprazole (PRILOSEC) 20 MG capsule Take 20 mg by mouth daily.      . pravastatin (PRAVACHOL) 80 MG tablet TAKE 1 TABLET BY MOUTH ONCE A DAY AT BEDTIME  30 tablet  5  . promethazine (PHENERGAN) 25 MG tablet TAKE 1 TABLET BY MOUTH EVERY 6 HOURS AS NEEDED FOR NAUSEA  20 tablet  1  . propranolol ER (INDERAL LA) 80 MG 24 hr capsule TAKE ONE CAPSULE EVERY DAY  30 capsule  5  . SUMAtriptan (IMITREX) 100 MG tablet 1 po for acute migraine, may repeat once in 24 hours.  15 tablet  12  . topiramate (TOPAMAX) 100 MG tablet Take 1 tablet (100 mg total) by mouth 2 (two) times daily.  60 tablet  12  . traMADol (ULTRAM) 50 MG tablet TAKE 2 TABLETS BY MOUTH THREE TIMES DAILY AS NEEDED FOR PAIN  180 tablet  0  . venlafaxine XR (EFFEXOR-XR) 150 MG 24 hr capsule TAKE 1 CAPSULE EVERY DAY  30 capsule  3   No current facility-administered medications on file prior to visit.   Allergies  Allergen Reactions  . Codeine     REACTION: hives  . Nsaids     REACTION: palpitations, diaphoresis  . Penicillins     REACTION: upset stomach   History   Social History  . Marital Status: Divorced    Spouse Name: N/A    Number of Children: 2  . Years of Education: 11   Occupational History  .  [employer]   Social History Main Topics  . Smoking status: Former Smoker -- 2.00 packs/day for 15 years    Types: Cigarettes    Quit date: 02/09/1995  . Smokeless tobacco: Never Used  . Alcohol Use: No  . Drug Use: No  . Sexual Activity: Not Currently    Other Topics Concern  . Not on file   Social History Narrative   Patient lives at home alone. Patient works at [employer].   Caffeine daily- 2   Right handed.     Review of Systems  All other systems reviewed and are negative.       Objective:   Physical Exam  Vitals reviewed. Cardiovascular: Normal rate and regular rhythm.   No murmur heard. Pulmonary/Chest: Effort normal and breath sounds normal. No respiratory distress. She has no wheezes. She has no rales. She exhibits no tenderness.  Abdominal: Soft. Bowel sounds are normal. She exhibits no distension and no mass. There is no tenderness. There is no rebound and no guarding.          Assessment & Plan:  1. Abdominal  pain, other specified site I think the patient's pain is likely constipation. I recommended she begin taking linzess 290 mcg poqday for 2 weeks.  Recheck in 1 week if no better or sooner if worse.  The patient is not taking amitiza even though it is on her medicine list.   I also get a two-view x-ray of the abdomen to evaluate for severe constipation. - DG Abd 2 Views; Future  2. Need for prophylactic vaccination and inoculation against influenza - Flu Vaccine QUAD 36+ mos IM

## 2012-11-14 ENCOUNTER — Ambulatory Visit (INDEPENDENT_AMBULATORY_CARE_PROVIDER_SITE_OTHER): Payer: BC Managed Care – PPO | Admitting: Family Medicine

## 2012-11-14 ENCOUNTER — Other Ambulatory Visit: Payer: Self-pay | Admitting: Family Medicine

## 2012-11-14 ENCOUNTER — Encounter: Payer: Self-pay | Admitting: Family Medicine

## 2012-11-14 VITALS — BP 126/70 | HR 78 | Temp 98.0°F | Resp 16 | Ht 62.5 in | Wt 244.0 lb

## 2012-11-14 DIAGNOSIS — K5732 Diverticulitis of large intestine without perforation or abscess without bleeding: Secondary | ICD-10-CM

## 2012-11-14 MED ORDER — METRONIDAZOLE 500 MG PO TABS: 500.0000 mg | ORAL_TABLET | Freq: Three times a day (TID) | ORAL | Status: DC

## 2012-11-14 MED ORDER — CIPROFLOXACIN HCL 500 MG PO TABS: 500.0000 mg | ORAL_TABLET | Freq: Two times a day (BID) | ORAL | Status: DC

## 2012-11-14 MED ORDER — HYDROCODONE-ACETAMINOPHEN 5-325 MG PO TABS: ORAL_TABLET | ORAL | Status: DC

## 2012-11-14 NOTE — Progress Notes (Signed)
Subjective:    Patient ID: Heather Haley, female    DOB: [birth year], 52 y.o.   MRN: [id number]  Fever   11/10/12  Patient presents with several weeks of lower pelvic pain. The pain is located in the right lower quadrant and the left lower quadrant. It is colicky in nature. It feels like a cramp or spasm. She has had severe constipation recently. She is typically going through to 4 days without a bowel movement. Recently she went almost 2 weeks without a bowel movement. She denies any melena or hematochezia. She denies any nausea vomiting or diarrhea. She reports subjective fevers. She denies any hematuria or dysuria. She denies any GERD-like symptoms.  She has a history of a total hysterectomy.  At the time my plan was: 1. Abdominal  pain, other specified site I think the patient's pain is likely constipation. I recommended she begin taking linzess 290 mcg poqday for 2 weeks.  Recheck in 1 week if no better or sooner if worse.  The patient is not taking amitiza even though it is on her medicine list.   I also get a two-view x-ray of the abdomen to evaluate for severe constipation. - DG Abd 2 Views; Future  Patient presents today he staying her symptoms are no better. Infectious had a fever to 101 over the weekend. The pain has intensified in her left lower quadrant. She still has not had a bowel movement. She denies any melena or hematochezia. She denies any dysuria or hematuria. Past Medical History  Diagnosis Date  . Hypertension   . IBS (irritable bowel syndrome)   . Postoperative nausea and vomiting   . Diabetes mellitus   . Left ankle instability   . Headache(784.0)   . Left knee DJD   . Depression   . Frequency of urination   . GERD (gastroesophageal reflux disease)    Past Surgical History  Procedure Laterality Date  . Ankle surgery    . Abdominal hysterectomy    . Knee arthroscopy  1997    left knee  . Total knee arthroplasty  08/30/2011    Procedure: TOTAL KNEE  ARTHROPLASTY;  Surgeon: Nilda Simmer, MD;  Location: [facility] OR;  Service: Orthopedics;  Laterality: Left;  . Colonoscopy  2013   Current Outpatient Prescriptions on File Prior to Visit  Medication Sig Dispense Refill  . ALPRAZolam (XANAX) 0.5 MG tablet Take 1 tablet (0.5 mg total) by mouth every 6 (six) hours as needed for sleep.  60 tablet  0  . cetirizine (ZYRTEC) 10 MG tablet Take 1 tablet (10 mg total) by mouth at bedtime.  30 tablet  11  . fluticasone (FLONASE) 50 MCG/ACT nasal spray USE 2 SPRAYS IN EACH NOSTRIL EVERY DAY  16 g  2  . glipiZIDE (GLUCOTROL XL) 5 MG 24 hr tablet TAKE 1 TABLET BY MOUTH ONCE A DAY  90 tablet  1  . HYDROcodone-acetaminophen (NORCO/VICODIN) 5-325 MG per tablet TAKE 1 TABLET BY MOUTH EVERY 6 HOURS AS NEEDED  30 tablet  0  . Hypromellose (ARTIFICIAL TEARS OP) Apply 2 drops to eye 4 (four) times daily as needed. Dry eye      . metFORMIN (GLUCOPHAGE) 1000 MG tablet TAKE 1 TABLET TWICE DAILY  180 tablet  1  . NEOMYCIN-POLYMYXIN-HYDROCORTISONE (CORTISPORIN) 1 % SOLN otic solution Place 3 drops into the left ear every 6 (six) hours.  10 mL  0  . nortriptyline (PAMELOR) 25 MG capsule Take 1 capsule (25 mg total) by  mouth at bedtime.  30 capsule  1  . omeprazole (PRILOSEC) 20 MG capsule Take 20 mg by mouth daily.      . pravastatin (PRAVACHOL) 80 MG tablet TAKE 1 TABLET BY MOUTH ONCE A DAY AT BEDTIME  30 tablet  5  . PREDNISONE, PAK, PO Take by mouth.      . promethazine (PHENERGAN) 25 MG tablet TAKE 1 TABLET BY MOUTH EVERY 6 HOURS AS NEEDED FOR NAUSEA  20 tablet  1  . propranolol ER (INDERAL LA) 80 MG 24 hr capsule TAKE ONE CAPSULE EVERY DAY  30 capsule  5  . SUMAtriptan (IMITREX) 100 MG tablet 1 po for acute migraine, may repeat once in 24 hours.  15 tablet  12  . topiramate (TOPAMAX) 100 MG tablet Take 1 tablet (100 mg total) by mouth 2 (two) times daily.  60 tablet  12  . traMADol (ULTRAM) 50 MG tablet TAKE 2 TABLETS BY MOUTH THREE TIMES DAILY AS NEEDED FOR PAIN  180  tablet  0  . venlafaxine XR (EFFEXOR-XR) 150 MG 24 hr capsule TAKE 1 CAPSULE EVERY DAY  30 capsule  3  . zolpidem (AMBIEN) 10 MG tablet Take 10 mg by mouth at bedtime as needed for sleep. For sleep       No current facility-administered medications on file prior to visit.   Allergies  Allergen Reactions  . Codeine     REACTION: hives  . Nsaids     REACTION: palpitations, diaphoresis  . Penicillins     REACTION: upset stomach   History   Social History  . Marital Status: Divorced    Spouse Name: N/A    Number of Children: 2  . Years of Education: 11   Occupational History  .  [employer]   Social History Main Topics  . Smoking status: Former Smoker -- 2.00 packs/day for 15 years    Types: Cigarettes    Quit date: 02/09/1995  . Smokeless tobacco: Never Used  . Alcohol Use: No  . Drug Use: No  . Sexual Activity: Not Currently   Other Topics Concern  . Not on file   Social History Narrative   Patient lives at home alone. Patient works at [employer].   Caffeine daily- 2   Right handed.     Review of Systems  Constitutional: Positive for fever.  All other systems reviewed and are negative.       Objective:   Physical Exam  Vitals reviewed. Cardiovascular: Normal rate and regular rhythm.   No murmur heard. Pulmonary/Chest: Effort normal and breath sounds normal. No respiratory distress. She has no wheezes. She has no rales. She exhibits no tenderness.  Abdominal: Soft. Bowel sounds are normal. She exhibits no distension and no mass. There is tenderness (she is now tender to palpation in the left lower quadrant). There is no rebound and no guarding.          Assessment & Plan:  1. Diverticulitis of colon without hemorrhage I recommended she discontinue Vicodin. Recommend she increase fluids. I recommended she begin MiraLax every day constipated bowel movement and relief of constipation. I started the patient on Cipro 500 mg by mouth twice a day for 10  days and Flagyl 500 mg by mouth 3 times a day for 10 days. I'll obtain a CT scan of abdomen and pelvis. She can discontinue linzess. - ciprofloxacin (CIPRO) 500 MG tablet; Take 1 tablet (500 mg total) by mouth 2 (two) times daily.  Dispense:  20 tablet; Refill: 0 - metroNIDAZOLE (FLAGYL) 500 MG tablet; Take 1 tablet (500 mg total) by mouth 3 (three) times daily.  Dispense: 30 tablet; Refill: 0 - CT Abdomen Pelvis W Contrast; Future

## 2012-11-14 NOTE — Telephone Encounter (Signed)
ok 

## 2012-11-14 NOTE — Telephone Encounter (Signed)
OK refill?? 

## 2012-11-14 NOTE — Telephone Encounter (Signed)
RX printed for provider signature 

## 2012-11-14 NOTE — Addendum Note (Signed)
Addended by: Donne Anon on: 11/14/2012 12:00 PM   Modules accepted: Orders

## 2012-11-16 ENCOUNTER — Telehealth: Payer: Self-pay | Admitting: Family Medicine

## 2012-11-16 NOTE — Telephone Encounter (Signed)
Patients wants to know if her CT Scan has been set up yet? Can she take tylenol with her Tramadol ?

## 2012-11-16 NOTE — Telephone Encounter (Signed)
Patient aware.

## 2012-11-20 ENCOUNTER — Ambulatory Visit
Admission: RE | Admit: 2012-11-20 | Discharge: 2012-11-20 | Disposition: A | Discharge status: Home / Self Care | Payer: Medicare Other | Source: Ambulatory Visit | Attending: Family Medicine | Admitting: Family Medicine

## 2012-11-20 DIAGNOSIS — K5732 Diverticulitis of large intestine without perforation or abscess without bleeding: Secondary | ICD-10-CM

## 2012-11-20 MED ORDER — IOHEXOL 300 MG/ML  SOLN: 125.0000 mL | Freq: Once | INTRAMUSCULAR | Status: AC | PRN

## 2012-11-22 ENCOUNTER — Encounter: Payer: Self-pay | Admitting: Family Medicine

## 2012-11-23 ENCOUNTER — Telehealth: Payer: Self-pay | Admitting: Family Medicine

## 2012-11-23 NOTE — Telephone Encounter (Signed)
She wants to know about her CT results Please call

## 2012-11-24 ENCOUNTER — Encounter: Payer: Self-pay | Admitting: Family Medicine

## 2012-11-27 NOTE — Telephone Encounter (Signed)
Pt is aware of results. 

## 2012-11-28 ENCOUNTER — Encounter: Payer: Self-pay | Admitting: Sports Medicine

## 2012-12-04 ENCOUNTER — Other Ambulatory Visit: Payer: Self-pay | Admitting: Family Medicine

## 2012-12-05 MED ORDER — TRAMADOL HCL 50 MG PO TABS: ORAL_TABLET | ORAL | Status: DC

## 2012-12-05 NOTE — Telephone Encounter (Signed)
Refill called in. 

## 2012-12-07 ENCOUNTER — Other Ambulatory Visit: Payer: Self-pay | Admitting: Internal Medicine

## 2012-12-08 ENCOUNTER — Encounter: Payer: Self-pay | Admitting: Family Medicine

## 2012-12-08 ENCOUNTER — Encounter: Payer: Self-pay | Admitting: Interventional Cardiology

## 2012-12-08 ENCOUNTER — Ambulatory Visit (INDEPENDENT_AMBULATORY_CARE_PROVIDER_SITE_OTHER): Payer: PRIVATE HEALTH INSURANCE | Admitting: Interventional Cardiology

## 2012-12-08 VITALS — BP 116/74 | HR 75 | Ht 66.0 in | Wt 246.8 lb

## 2012-12-08 DIAGNOSIS — I10 Essential (primary) hypertension: Secondary | ICD-10-CM

## 2012-12-08 DIAGNOSIS — R0789 Other chest pain: Secondary | ICD-10-CM

## 2012-12-08 NOTE — Patient Instructions (Addendum)
Your physician recommends that you continue on your current medications as directed. Please refer to the Current Medication list given to you today.  Your physician has requested that you have a lexiscan myoview. For further information please visit [email or web address] Please follow instruction sheet, as given.   Follow up pending lexiscan results

## 2012-12-08 NOTE — Progress Notes (Signed)
Patient ID: Heather Haley, female   DOB: Jul 14, 1960, 52 y.o.   MRN: [id number]   Date: 12/08/2012 ID: Heather Haley, DOB [birth year], MRN [id number] PCP: Leo Grosser, MD  Reason: Chest discomfort  ASSESSMENT;  1. Chest pressure occurring spontaneously lasting up to 5 minutes and resolving. No exertional component. History of coronary atherosclerosis with 50% LAD by cath in 2011 2. Sharp left axillary chest discomfort, positional 3. Diabetes mellitus 4. Obesity  PLAN:  1. Lexiscan myocardial perfusion study to rule out progression of coronary disease 2. Aspirin 81 mg per day  SUBJECTIVE: Heather Haley is a 52 y.o. female who is referred for evaluation of chest pain. She has recently had episodes of pressure in her chest. These episodes last up to 5 minutes. They are nonexertional but are associated with dyspnea and diaphoresis. She also tells of an episode of left axillary pain that occurred during an upper GI series which he had to hold her arms above her head. This causes pain to radiate into her neck. This has not been a recurring problem. She denies palpitations. There is no orthopnea. Chest discomfort does not occur with physical activity.   Allergies  Allergen Reactions  . Codeine     REACTION: hives  . Nsaids     REACTION: palpitations, diaphoresis  . Penicillins     REACTION: upset stomach    Current Outpatient Prescriptions on File Prior to Visit  Medication Sig Dispense Refill  . ALPRAZolam (XANAX) 0.5 MG tablet Take 1 tablet (0.5 mg total) by mouth every 6 (six) hours as needed for sleep.  60 tablet  0  . cetirizine (ZYRTEC) 10 MG tablet Take 1 tablet (10 mg total) by mouth at bedtime.  30 tablet  11  . fluticasone (FLONASE) 50 MCG/ACT nasal spray USE 2 SPRAYS IN EACH NOSTRIL EVERY DAY  16 g  2  . glipiZIDE (GLUCOTROL XL) 5 MG 24 hr tablet TAKE 1 TABLET BY MOUTH ONCE A DAY  90 tablet  1  . HYDROcodone-acetaminophen (NORCO/VICODIN) 5-325 MG per  tablet TAKE 1 TABLET BY MOUTH EVERY 6 HOURS AS NEEDED  30 tablet  0  . Hypromellose (ARTIFICIAL TEARS OP) Apply 2 drops to eye 4 (four) times daily as needed. Dry eye      . metFORMIN (GLUCOPHAGE) 1000 MG tablet TAKE 1 TABLET TWICE DAILY  180 tablet  1  . metroNIDAZOLE (FLAGYL) 500 MG tablet Take 1 tablet (500 mg total) by mouth 3 (three) times daily.  30 tablet  0  . pravastatin (PRAVACHOL) 80 MG tablet TAKE 1 TABLET BY MOUTH ONCE A DAY AT BEDTIME  30 tablet  5  . promethazine (PHENERGAN) 25 MG tablet TAKE 1 TABLET BY MOUTH EVERY 6 HOURS AS NEEDED FOR NAUSEA  20 tablet  1  . propranolol ER (INDERAL LA) 80 MG 24 hr capsule TAKE ONE CAPSULE EVERY DAY  30 capsule  5  . SUMAtriptan (IMITREX) 100 MG tablet 1 po for acute migraine, may repeat once in 24 hours.  15 tablet  12  . topiramate (TOPAMAX) 100 MG tablet Take 1 tablet (100 mg total) by mouth 2 (two) times daily.  60 tablet  12  . traMADol (ULTRAM) 50 MG tablet TAKE 2 TABLETS BY MOUTH THREE TIMES DAILY AS NEEDED FOR PAIN  180 tablet  0  . venlafaxine XR (EFFEXOR-XR) 150 MG 24 hr capsule TAKE 1 CAPSULE EVERY DAY  30 capsule  3  . zolpidem (AMBIEN) 10 MG tablet Take 10  mg by mouth at bedtime as needed for sleep. For sleep       No current facility-administered medications on file prior to visit.    Past Medical History  Diagnosis Date  . Hypertension   . IBS (irritable bowel syndrome)   . Postoperative nausea and vomiting   . Diabetes mellitus   . Left ankle instability   . Headache(784.0)   . Left knee DJD   . Depression   . Frequency of urination   . GERD (gastroesophageal reflux disease)     Past Surgical History  Procedure Laterality Date  . Ankle surgery    . Abdominal hysterectomy    . Knee arthroscopy  1997    left knee  . Total knee arthroplasty  08/30/2011    Procedure: TOTAL KNEE ARTHROPLASTY;  Surgeon: Nilda Simmer, MD;  Location: [facility] OR;  Service: Orthopedics;  Laterality: Left;  . Colonoscopy  2013    History    Social History  . Marital Status: Divorced    Spouse Name: N/A    Number of Children: 2  . Years of Education: 11   Occupational History  .  [employer]   Social History Main Topics  . Smoking status: Former Smoker -- 2.00 packs/day for 15 years    Types: Cigarettes    Quit date: 02/09/1995  . Smokeless tobacco: Never Used  . Alcohol Use: No  . Drug Use: No  . Sexual Activity: Not Currently   Other Topics Concern  . Not on file   Social History Narrative   Patient lives at home alone. Patient works at [employer].   Caffeine daily- 2   Right handed.    Family History  Problem Relation Age of Onset  . Cancer Mother   . Hypertension Father   . Heart disease Father     ROS: Multiple musculoskeletal complaints. Chronic pain left lower extremity do to a fractured/angina ankle. Anxiety. Gastroesophageal reflux. She denies palpitations and syncope. No orthopnea, PND, edema, or history of stroke.. Other systems negative for complaints.  OBJECTIVE: BP 116/74  Pulse 75  Ht 5\' 6"  (1.676 m)  Wt 246 lb 12.8 oz (111.948 kg)  BMI 39.85 kg/m2,  General: No acute distress, obese, appearing older than stated age HEENT: normal , no jaundice or pallor Neck: JVD flat. Carotids absent Chest: Clear Cardiac: Murmur: None. Gallop: None. Rhythm: Regular. Other: Normal Abdomen: Bruit: Absent. Pulsation: Present Extremities: Edema: Absent. Pulses: Faint lower extremity pulses Neuro: No abnormality Psych: Anxious  ECG: Poor R-wave progression with precordial T-wave abnormality. The T-wave abnormality is nonspecific

## 2012-12-10 ENCOUNTER — Encounter: Payer: Self-pay | Admitting: Interventional Cardiology

## 2012-12-12 ENCOUNTER — Ambulatory Visit (INDEPENDENT_AMBULATORY_CARE_PROVIDER_SITE_OTHER): Payer: BC Managed Care – PPO | Admitting: Sports Medicine

## 2012-12-12 ENCOUNTER — Encounter: Payer: Self-pay | Admitting: Sports Medicine

## 2012-12-12 VITALS — BP 124/88 | HR 78 | Ht 66.0 in | Wt 246.0 lb

## 2012-12-12 DIAGNOSIS — M25572 Pain in left ankle and joints of left foot: Secondary | ICD-10-CM

## 2012-12-12 DIAGNOSIS — M25579 Pain in unspecified ankle and joints of unspecified foot: Secondary | ICD-10-CM

## 2012-12-12 NOTE — Patient Instructions (Signed)
Continue working on easy range of motion with ankle.

## 2012-12-12 NOTE — Assessment & Plan Note (Signed)
Not sure we have a good option for better medicine usage  I will arrange for her to get a ice compression system from [employer] to use over the ankle  This is basically a "frozen ankle" with a lot of loss of motion that would be consistent with RSD However, gabapentin and lyrica did not help Had side effects with elavil and notriptyline  Reck P using icing system for 3 mos

## 2012-12-12 NOTE — Progress Notes (Signed)
  Subjective:    Patient ID: Heather Haley, female    DOB: 12-01-1960, 52 y.o.   MRN: [id number]  HPI  Pt presents to clinic for f/u of lt ankle pain which has not changed. Using compression sleeve intermittently throughout the day. Discontinued nortriptyline due to hair loss and night mares. Also discontinued capsin due to burning. Taking tramadol 50 mg - 2 tabs three times per day. Vicodin for severe pain, 1-2 times per week.   Feels that the cooler weather is helping.  Icing helps briefly but definitely gives pain relief.  Scheduled for cardiac evaluation with nuclear stress test on 12/28/2012.     Review of Systems     Objective:   Physical Exam  NAD  Lt ankle exam: Mild swelling over peroneal tendons 15 deg inversion, 5 deg eversion Dorsiflexion - 3 deg   Plantar flexion - 30 deg AT mildly thick, not swollen No ankle effusion today        Assessment & Plan:

## 2012-12-15 ENCOUNTER — Encounter: Payer: Self-pay | Admitting: Neurology

## 2012-12-15 ENCOUNTER — Telehealth: Payer: Self-pay | Admitting: Neurology

## 2012-12-15 ENCOUNTER — Ambulatory Visit (INDEPENDENT_AMBULATORY_CARE_PROVIDER_SITE_OTHER): Payer: BC Managed Care – PPO | Admitting: Neurology

## 2012-12-15 DIAGNOSIS — R51 Headache: Secondary | ICD-10-CM

## 2012-12-15 MED ORDER — DICLOFENAC POTASSIUM(MIGRAINE) 50 MG PO PACK: 50.0000 mg | PACK | ORAL | Status: DC | PRN

## 2012-12-15 NOTE — Progress Notes (Signed)
History of Present Illness:   Heather Haley is a 52 years old right-handed Caucasian female, referred by her primary care physician Dr. Kerby Nora for evaluation of headaches  She had a past medical history of depression, obesity, diabetes, hypertension, hyperlipidemia, had multiple left ankle surgery, most recent surgery was in 2009, with chronic left ankle pain, wearing left ankle brace, left knee replacement July 2013, complicated by left lower extremity DVT, was temporarily treated with Coumadin.  She denies a previous history of headaches, over the past 3 months, she had almost daily headaches, woke up with a left temporoparietal area pressure sensation, getting worse as day goes by, by the evening time, it is often exacerbated to moderate to severe pounding headaches, with associated light sensitivity, worsening by movement, she has tried Tylenol, tramadol, without much help,  She was put on gabapentin 600 mg 3 times a day, with mild help, she reports 20 pounds weight gain over past 2 months, there are some daytime fatigue, and sleepiness, Epworth sleeping scale is 8 today, she denies excessive snoring,   UPDATE July 15th 2014:  She continues to have frequent headaches, this started about 6 months ago, getting worse since June, she has 1-2 headaches each week, left temporal region dull achy pain, with left facial pain, numbness, lasting for few hours, Imitrex was helping within 30 minutes.  She has associated blurry vision, staggering during headaches.  She had sleep study,: reached a nadir of 85%, desaturations worsened during last hour of two of the night when patient seemed to be getting better sleep but worsening periods of hypopnea. But she did not return for titration test.  She continued to have left ankle pain, wear left ankle bracelet  UPDATE 12/15/2012: Her headache is less frequent, has 1-2 each month, but her headache lasted longer, more severe, left retrorbital, with left ear ringing, light,  noise sensitivity, imitrex helped some times,  Topamax seems to help her some.   She has chest pain with headaches, stress test is pending in Nov 20th.    Physical Exam  Neck: supple no carotid bruits Respiratory: clear to auscultation bilaterally Cardiovascular: regular rate rhythm  Neurologic Exam  Mental Status: pleasant, awake, alert, cooperative to history, talking, and casual conversation. Cranial Nerves: CN II-XII pupils were equal round reactive to light.  Fundi were sharp bilaterally.  Extraocular movements were full.  Visual fields were full on confrontational test.  Facial sensation and strength were normal.  Hearing was intact to finger rubbing bilaterally.  Uvula tongue were midline.  Head turning and shoulder shrugging were normal and symmetric.  Tongue protrusion into the cheeks strength were normal.  Motor: Normal tone, bulk, and strength, left ankle lateral surgical scar. Sensory: Normal to light touch, pinprick, proprioception, and vibratory sensation. Coordination: Normal finger-to-nose, heel-to-shin.  There was no dysmetria noticed. Gait and Station: wear left ankle brace, mildly limp.  Romberg sign: Negative Reflexes: Deep tendon reflexes: Biceps: 2/2, Brachioradialis: 2/2, Triceps: 2/2, Pateller: 2/2, Achilles: trace.  Plantar responses are flexor.   Assessment and Plan:  52 years old Caucasian female, presenting with new onset headaches, normal neurological examination, and normal CT scan,  1. Her headache has migraine features 2. Keep preventive medication topiramate 100mg  twice a day and propranolol 3. Imitrex as needed 4. cambien as needed. 5. RTC in 4 month with Eber Jones

## 2012-12-18 ENCOUNTER — Other Ambulatory Visit: Payer: Self-pay

## 2012-12-18 MED ORDER — DICLOFENAC POTASSIUM(MIGRAINE) 50 MG PO PACK: 50.0000 mg | PACK | ORAL | Status: DC | PRN

## 2012-12-22 ENCOUNTER — Ambulatory Visit: Payer: BC Managed Care – PPO | Admitting: Neurology

## 2012-12-22 ENCOUNTER — Telehealth: Payer: Self-pay | Admitting: Neurology

## 2012-12-25 ENCOUNTER — Encounter (HOSPITAL_COMMUNITY): Payer: Self-pay

## 2012-12-25 MED ORDER — TOPIRAMATE 100 MG PO TABS: 100.0000 mg | ORAL_TABLET | Freq: Two times a day (BID) | ORAL | Status: DC

## 2012-12-25 NOTE — Telephone Encounter (Signed)
Please let patient know, I have written topamax 100mg  bid.

## 2012-12-26 ENCOUNTER — Encounter: Payer: Self-pay | Admitting: Family Medicine

## 2012-12-26 ENCOUNTER — Ambulatory Visit (INDEPENDENT_AMBULATORY_CARE_PROVIDER_SITE_OTHER): Payer: BC Managed Care – PPO | Admitting: Family Medicine

## 2012-12-26 VITALS — BP 130/78 | HR 82 | Temp 97.0°F | Resp 18 | Ht 62.5 in | Wt 244.0 lb

## 2012-12-26 DIAGNOSIS — H6990 Unspecified Eustachian tube disorder, unspecified ear: Secondary | ICD-10-CM

## 2012-12-26 DIAGNOSIS — H6982 Other specified disorders of Eustachian tube, left ear: Secondary | ICD-10-CM

## 2012-12-26 DIAGNOSIS — H6980 Other specified disorders of Eustachian tube, unspecified ear: Secondary | ICD-10-CM

## 2012-12-26 MED ORDER — PROPRANOLOL HCL ER 80 MG PO CP24: ORAL_CAPSULE | ORAL | Status: DC

## 2012-12-26 MED ORDER — DICLOFENAC SODIUM 75 MG PO TBEC: 75.0000 mg | DELAYED_RELEASE_TABLET | Freq: Two times a day (BID) | ORAL | Status: DC

## 2012-12-26 MED ORDER — TOPIRAMATE 100 MG PO TABS: 150.0000 mg | ORAL_TABLET | Freq: Two times a day (BID) | ORAL | Status: DC

## 2012-12-26 MED ORDER — PREDNISONE 20 MG PO TABS: ORAL_TABLET | ORAL | Status: DC

## 2012-12-26 MED ORDER — FLUCONAZOLE 150 MG PO TABS: 150.0000 mg | ORAL_TABLET | Freq: Once | ORAL | Status: DC

## 2012-12-26 NOTE — Progress Notes (Signed)
Subjective:    Patient ID: Heather Haley, female    DOB: [birth year], 52 y.o.   MRN: [id number]  HPI Patient presents today with one week of left ear pain. She complains that her ear. The. Whenever she chews or yawns the pressure release. She also reports green ears and decreased hearing. She denies any fever. She does complain of pain radiating from behind her left ear down into her jaw. She denies any sore throat or postnasal drip. Past Medical History  Diagnosis Date  . Hypertension   . IBS (irritable bowel syndrome)   . Postoperative nausea and vomiting   . Diabetes mellitus   . Left ankle instability   . Headache(784.0)   . Left knee DJD   . Depression   . Frequency of urination   . GERD (gastroesophageal reflux disease)    Current Outpatient Prescriptions on File Prior to Visit  Medication Sig Dispense Refill  . ALPRAZolam (XANAX) 0.5 MG tablet Take 1 tablet (0.5 mg total) by mouth every 6 (six) hours as needed for sleep.  60 tablet  0  . cetirizine (ZYRTEC) 10 MG tablet Take 1 tablet (10 mg total) by mouth at bedtime.  30 tablet  11  . Diclofenac Potassium 50 MG PACK Take 50 mg by mouth as needed.  9 each  6  . fluticasone (FLONASE) 50 MCG/ACT nasal spray USE 2 SPRAYS IN EACH NOSTRIL EVERY DAY  16 g  2  . glipiZIDE (GLUCOTROL XL) 5 MG 24 hr tablet TAKE 1 TABLET BY MOUTH ONCE A DAY  90 tablet  1  . HYDROcodone-acetaminophen (NORCO/VICODIN) 5-325 MG per tablet TAKE 1 TABLET BY MOUTH EVERY 6 HOURS AS NEEDED  30 tablet  0  . Hypromellose (ARTIFICIAL TEARS OP) Apply 2 drops to eye 4 (four) times daily as needed. Dry eye      . metFORMIN (GLUCOPHAGE) 1000 MG tablet TAKE 1 TABLET TWICE DAILY  180 tablet  1  . metroNIDAZOLE (FLAGYL) 500 MG tablet Take 1 tablet (500 mg total) by mouth 3 (three) times daily.  30 tablet  0  . pravastatin (PRAVACHOL) 80 MG tablet TAKE 1 TABLET BY MOUTH ONCE A DAY AT BEDTIME  30 tablet  5  . promethazine (PHENERGAN) 25 MG tablet TAKE 1 TABLET BY MOUTH  EVERY 6 HOURS AS NEEDED FOR NAUSEA  20 tablet  1  . SUMAtriptan (IMITREX) 100 MG tablet 1 po for acute migraine, may repeat once in 24 hours.  15 tablet  12  . topiramate (TOPAMAX) 100 MG tablet Take 1.5 tablets (150 mg total) by mouth 2 (two) times daily.  90 tablet  12  . traMADol (ULTRAM) 50 MG tablet TAKE 2 TABLETS BY MOUTH THREE TIMES DAILY AS NEEDED FOR PAIN  180 tablet  0  . venlafaxine XR (EFFEXOR-XR) 150 MG 24 hr capsule TAKE 1 CAPSULE EVERY DAY  30 capsule  3   No current facility-administered medications on file prior to visit.   Allergies  Allergen Reactions  . Codeine     REACTION: hives  . Nortripytline Hcl [Nortriptyline]     Hair loss and night mares  . Nsaids     REACTION: palpitations, diaphoresis  . Penicillins     REACTION: upset stomach   History   Social History  . Marital Status: Divorced    Spouse Name: N/A    Number of Children: 2  . Years of Education: 11   Occupational History  .  [employer]   Social  History Main Topics  . Smoking status: Former Smoker -- 2.00 packs/day for 15 years    Types: Cigarettes    Quit date: 02/09/1995  . Smokeless tobacco: Never Used  . Alcohol Use: No  . Drug Use: No  . Sexual Activity: Not Currently   Other Topics Concern  . Not on file   Social History Narrative   Patient lives at home alone. Patient works at [employer].   Caffeine daily- 2   Right handed.   Education- 11 th grade   Family History  Problem Relation Age of Onset  . Cancer Mother   . Hypertension Father   . Heart disease Father       Review of Systems  All other systems reviewed and are negative.       Objective:   Physical Exam  Vitals reviewed. Constitutional: She appears well-developed and well-nourished.  HENT:  Right Ear: Hearing, tympanic membrane, external ear and ear canal normal.  Left Ear: Hearing, tympanic membrane, external ear and ear canal normal.  Nose: Nose normal.  Mouth/Throat: Oropharynx is clear and  moist. No oropharyngeal exudate.  Neck: Neck supple. No JVD present. No thyromegaly present.  Cardiovascular: Normal rate and regular rhythm.   Pulmonary/Chest: Effort normal and breath sounds normal.  Lymphadenopathy:    She has no cervical adenopathy.          Assessment & Plan:  1. Eustachian tube dysfunction, left Patient's symptoms sound like eustachian tube dysfunction. Her exam today is completely normal. She is currently taking Flonase as well as Zyrtec. Therefore I recommended a prednisone taper pack. She is to call me if symptoms persist. - predniSONE (DELTASONE) 20 MG tablet; 3 tabs poqday 1-2, 2 tabs poqday 3-4, 1 tab poqday 5-6  Dispense: 12 tablet; Refill: 0

## 2012-12-26 NOTE — Telephone Encounter (Signed)
I called patient and let her know Dr. Terrace Arabia submitted Rx for topamax 100 mg, Take 1.5 tabs two times per day. (See Rx for specifics)

## 2012-12-26 NOTE — Telephone Encounter (Signed)
I have called [phone], she is taking topamax 100mg  bid, she denies side effect from topamax, wish better control of her migraine, I will increase topamax to 150mg  bid, new Rx called in

## 2012-12-28 ENCOUNTER — Encounter: Payer: Self-pay | Admitting: Cardiovascular Disease

## 2012-12-28 ENCOUNTER — Ambulatory Visit (HOSPITAL_COMMUNITY): Payer: BC Managed Care – PPO | Attending: Cardiovascular Disease | Admitting: Radiology

## 2012-12-28 VITALS — BP 101/74 | HR 61 | Ht 66.0 in | Wt 239.0 lb

## 2012-12-28 DIAGNOSIS — Z8249 Family history of ischemic heart disease and other diseases of the circulatory system: Secondary | ICD-10-CM | POA: Insufficient documentation

## 2012-12-28 DIAGNOSIS — E119 Type 2 diabetes mellitus without complications: Secondary | ICD-10-CM | POA: Insufficient documentation

## 2012-12-28 DIAGNOSIS — R002 Palpitations: Secondary | ICD-10-CM | POA: Insufficient documentation

## 2012-12-28 DIAGNOSIS — R0602 Shortness of breath: Secondary | ICD-10-CM | POA: Insufficient documentation

## 2012-12-28 DIAGNOSIS — Z87891 Personal history of nicotine dependence: Secondary | ICD-10-CM | POA: Insufficient documentation

## 2012-12-28 DIAGNOSIS — R079 Chest pain, unspecified: Secondary | ICD-10-CM

## 2012-12-28 DIAGNOSIS — R0789 Other chest pain: Secondary | ICD-10-CM | POA: Insufficient documentation

## 2012-12-28 DIAGNOSIS — I10 Essential (primary) hypertension: Secondary | ICD-10-CM | POA: Insufficient documentation

## 2012-12-28 DIAGNOSIS — E785 Hyperlipidemia, unspecified: Secondary | ICD-10-CM | POA: Insufficient documentation

## 2012-12-28 MED ORDER — AMINOPHYLLINE 25 MG/ML IV SOLN
75.0000 mg | Freq: Once | INTRAVENOUS | Status: AC
  Administered 2012-12-28: 75 mg via INTRAVENOUS

## 2012-12-28 MED ORDER — TECHNETIUM TC 99M SESTAMIBI GENERIC - CARDIOLITE
30.0000 | Freq: Once | INTRAVENOUS | Status: AC | PRN
  Administered 2012-12-28: 30 via INTRAVENOUS

## 2012-12-28 MED ORDER — REGADENOSON 0.4 MG/5ML IV SOLN
0.4000 mg | Freq: Once | INTRAVENOUS | Status: AC
  Administered 2012-12-28: 0.4 mg via INTRAVENOUS

## 2012-12-28 NOTE — Progress Notes (Signed)
[facility] SITE 3 NUCLEAR MED [address] [place], [place] [address] [phone]    Cardiology Nuclear Med Study  Heather Haley is a 52 y.o. female     MRN : [id number]     DOB: 11/05/1960  Procedure Date: 12/28/2012  Nuclear Med Background Indication for Stress Test:  Evaluation for Ischemia History:  No prior known history of CAD, '08 Myocardial Perfusion Imaging-Small Anterior Apical Ischemia, EF=77%> Cath: Ok per patient Cardiac Risk Factors: Family History - CAD, History of Smoking, Hypertension, Lipids and NIDDM  Symptoms: Chest Tightness with/without exertion (last occurrence last night), Palpitations and SOB   Nuclear Pre-Procedure Caffeine/Decaff Intake:  None NPO After: 8:00pm   Lungs:  clear O2 Sat: 94% on room air. IV 0.9% NS with Angio Cath:  22g  IV Site: L Antecubital x 1, tolerated well IV Started by:  Patsy Edwards, RN  Chest Size (in):  44 Cup Size: C  Height: 5' 6" (1.676 m)  Weight:  239 lb (108.41 kg)  BMI:  Body mass index is 38.59 kg/(m^2). Tech Comments:  No medications today    Nuclear Med Study 1 or 2 day study: 2 day  Stress Test Type:  Lexiscan  Reading MD: Henry Smith, MD  Order Authorizing Provider:  Henry Smith III, MD  Resting Radionuclide: Technetium 99m Sestamibi  Resting Radionuclide Dose: 33.0 mCi on 01-02-13  Stress Radionuclide:  Technetium 99m Sestamibi  Stress Radionuclide Dose: 33.0 mCi on 12-28-12          Stress Protocol Rest HR: 61 Stress HR: 79  Rest BP: 101/74 Stress BP: 111/77  Exercise Time (min): n/a METS: n/a   Predicted Max HR: 167 bpm % Max HR: 47.31 bpm Rate Pressure Product: 8769   Dose of Adenosine (mg):  n/a Dose of Lexiscan: 0.4 mg  Dose of Atropine (mg): n/a Dose of Dobutamine: n/a mcg/kg/min (at max HR)  Stress Test Technologist: Patsy Edwards, RN  Nuclear Technologist:  Wendy Bryson, CNMT     Rest Procedure:  Myocardial perfusion imaging was performed at rest 45 minutes following the  intravenous administration of Technetium 99m Sestamibi. Rest ECG: Sinus bradycardia  Stress Procedure:  The patient received IV Lexiscan 0.4 mg over 15-seconds.  Technetium 99m Sestamibi injected at 30-seconds. The patient complained of chest tightness, SOB, pain (L) armpit, and headache with Lexiscan. Aminophylline 75 mg IVP given post recovery due to persistent headache with quick improvement of symptom. Quantitative spect images were obtained after a 45 minute delay. Stress ECG: No significant change from baseline ECG  QPS Raw Data Images:  There is a breast shadow that accounts for the anterior attenuation. Stress Images:  There is decreased uptake in the lateral wall. Rest Images:  Normal homogeneous uptake in all areas of the myocardium. Subtraction (SDS):  These findings are consistent with ischemia. Transient Ischemic Dilatation (Normal <1.22):  1.12 Lung/Heart Ratio (Normal <0.45):  0.56  Quantitative Gated Spect Images QGS EDV:  75 ml QGS ESV:  23 ml  Impression Exercise Capacity:  Lexiscan with no exercise. BP Response:  Normal blood pressure response. Clinical Symptoms:  Mild chest pain/dyspnea. ECG Impression:  No significant ST segment change suggestive of ischemia. Comparison with Prior Nuclear Study: No images to compare  Overall Impression:  Intermediate risk stress nuclear study with large region of lateral wall ischemia..  LV Ejection Fraction: 69%.  LV Wall Motion:  NL LV Function; NL Wall Motion         

## 2012-12-29 ENCOUNTER — Other Ambulatory Visit: Payer: Self-pay | Admitting: Family Medicine

## 2012-12-29 ENCOUNTER — Encounter: Payer: Self-pay | Admitting: Family Medicine

## 2013-01-01 ENCOUNTER — Other Ambulatory Visit: Payer: Self-pay | Admitting: Family Medicine

## 2013-01-02 ENCOUNTER — Ambulatory Visit (HOSPITAL_COMMUNITY): Payer: Medicare Other | Attending: Cardiology | Admitting: Radiology

## 2013-01-02 ENCOUNTER — Telehealth: Payer: Self-pay | Admitting: Family Medicine

## 2013-01-02 DIAGNOSIS — R0989 Other specified symptoms and signs involving the circulatory and respiratory systems: Secondary | ICD-10-CM

## 2013-01-02 MED ORDER — TRAMADOL HCL 50 MG PO TABS: ORAL_TABLET | ORAL | Status: DC

## 2013-01-02 MED ORDER — TECHNETIUM TC 99M SESTAMIBI GENERIC - CARDIOLITE
30.0000 | Freq: Once | INTRAVENOUS | Status: AC | PRN
  Administered 2013-01-02: 30 via INTRAVENOUS

## 2013-01-02 NOTE — Telephone Encounter (Signed)
Med c/o to pharmacy requested per pt

## 2013-01-02 NOTE — Telephone Encounter (Signed)
ok 

## 2013-01-02 NOTE — Telephone Encounter (Signed)
?   OK to Refill  

## 2013-01-02 NOTE — Telephone Encounter (Signed)
Last Rf 10/28 #180 Last OV 11/18  OK refill?

## 2013-01-02 NOTE — Telephone Encounter (Signed)
Message copied by Ricard Dillon on Tue Jan 02, 2013 12:49 PM ------      Message from: [facility], Lindalou Hose      Created: Tue Jan 02, 2013 11:12 AM       She needs her Tramadol Rx called into [facility] in [place]      [phone] ------

## 2013-01-03 ENCOUNTER — Telehealth: Payer: Self-pay

## 2013-01-03 DIAGNOSIS — R9439 Abnormal result of other cardiovascular function study: Secondary | ICD-10-CM

## 2013-01-03 DIAGNOSIS — Z01812 Encounter for preprocedural laboratory examination: Secondary | ICD-10-CM

## 2013-01-03 NOTE — Telephone Encounter (Signed)
Message copied by Jarvis Newcomer on Wed Jan 03, 2013  3:21 PM ------      Message from: Verdis Prime      Created: Wed Jan 03, 2013  3:01 PM       Large area of lateral ischemia. Needs coronary angio and possible PCI set up. May need OV to discuss if she has questions. ------

## 2013-01-03 NOTE — Telephone Encounter (Signed)
pt given resutls of nuclear study.Large area of lateral ischemia. Needs coronary angio and possible PCI set up. pt wants to proceed with cath.instructed pt to go to the ED if she is experiencing chest pain before cath is performed.pt given instructions to report to [facility] for preprocedure labs and chest xray Monday 01/08/13.will call pt back on Monday to discuss time cath will be sch with Dr.Smith on 12/4/14pt verbalized understanding and was agreeable with plan

## 2013-01-07 ENCOUNTER — Other Ambulatory Visit: Payer: Self-pay | Admitting: Family Medicine

## 2013-01-08 ENCOUNTER — Ambulatory Visit (INDEPENDENT_AMBULATORY_CARE_PROVIDER_SITE_OTHER)
Admission: RE | Admit: 2013-01-08 | Discharge: 2013-01-08 | Disposition: A | Discharge status: Home / Self Care | Payer: Medicare Other | Source: Ambulatory Visit | Attending: Interventional Cardiology | Admitting: Interventional Cardiology

## 2013-01-08 ENCOUNTER — Telehealth: Payer: Self-pay | Admitting: Interventional Cardiology

## 2013-01-08 ENCOUNTER — Other Ambulatory Visit (INDEPENDENT_AMBULATORY_CARE_PROVIDER_SITE_OTHER): Payer: Medicare Other

## 2013-01-08 DIAGNOSIS — R9439 Abnormal result of other cardiovascular function study: Secondary | ICD-10-CM

## 2013-01-08 DIAGNOSIS — Z01812 Encounter for preprocedural laboratory examination: Secondary | ICD-10-CM

## 2013-01-08 LAB — CBC WITH DIFFERENTIAL/PLATELET
Basophils Absolute: 0.1 10*3/uL (ref 0.0–0.1)
Basophils Relative: 0.5 % (ref 0.0–3.0)
Eosinophils Relative: 3.1 % (ref 0.0–5.0)
HCT: 43.5 % (ref 36.0–46.0)
Hemoglobin: 14.4 g/dL (ref 12.0–15.0)
Lymphocytes Relative: 22.7 % (ref 12.0–46.0)
Lymphs Abs: 2.4 10*3/uL (ref 0.7–4.0)
Monocytes Relative: 4 % (ref 3.0–12.0)
Neutro Abs: 7.4 10*3/uL (ref 1.4–7.7)
RBC: 5 Mil/uL (ref 3.87–5.11)

## 2013-01-08 LAB — BASIC METABOLIC PANEL WITH GFR
BUN: 15 mg/dL (ref 6–23)
CO2: 23 meq/L (ref 19–32)
Calcium: 9.4 mg/dL (ref 8.4–10.5)
Chloride: 105 meq/L (ref 96–112)
Creatinine, Ser: 1 mg/dL (ref 0.4–1.2)
GFR: 60.09 mL/min
Glucose, Bld: 137 mg/dL — ABNORMAL HIGH (ref 70–99)
Potassium: 4.4 meq/L (ref 3.5–5.1)
Sodium: 139 meq/L (ref 135–145)

## 2013-01-08 MED ORDER — ASPIRIN EC 81 MG PO TBEC: 81.0000 mg | DELAYED_RELEASE_TABLET | Freq: Every day | ORAL | Status: DC

## 2013-01-08 NOTE — Telephone Encounter (Signed)
returned pt call. pt sts that she has a migraine and has taken her migraine medication.adv pt that she would need to consult with her pcp for migraine instructions.pt verbalized understanding.

## 2013-01-08 NOTE — Telephone Encounter (Signed)
pt given address for [facility].pt will go today to have lab and chest xray.pt given date and time for cath 01/11/13@10 :30 be there 2 hours prior with Dr.Smith. pt given verbal preprocedure instructions.hold glipizide and metformin the night before.hold metformin 48 hrs after procedure. No po after midnight. Ok to take other meds with a sip of water. Make sure she takes her aspirin 81mg .will mail a copy of instructions also.pt verbalized understanding

## 2013-01-08 NOTE — Telephone Encounter (Signed)
Follow Up:  Pt states she has a question for Misty Stanley and she would like a call back. Pt states she will give more details when [place] calls.

## 2013-01-08 NOTE — Telephone Encounter (Signed)
New message     Where is pt supposed to go for her xray---somewhere near [facility]

## 2013-01-09 ENCOUNTER — Encounter (HOSPITAL_COMMUNITY): Payer: Self-pay | Admitting: Pharmacy Technician

## 2013-01-10 ENCOUNTER — Telehealth: Payer: Self-pay

## 2013-01-10 ENCOUNTER — Other Ambulatory Visit: Payer: Self-pay | Admitting: Interventional Cardiology

## 2013-01-10 DIAGNOSIS — R9439 Abnormal result of other cardiovascular function study: Secondary | ICD-10-CM

## 2013-01-10 NOTE — Telephone Encounter (Signed)
Message copied by Jarvis Newcomer on Wed Jan 10, 2013  5:20 PM ------      Message from: Verdis Prime      Created: Tue Jan 09, 2013  5:40 PM       Mild heart enlargement on x-ray ------

## 2013-01-10 NOTE — Telephone Encounter (Signed)
Message copied by Jarvis Newcomer on Wed Jan 10, 2013  5:23 PM ------      Message from: Verdis Prime      Created: Tue Jan 09, 2013  5:30 PM       The labs are unremarkable. ------

## 2013-01-10 NOTE — Telephone Encounter (Signed)
pt aware of chest xray results.Mild heart enlargement on x-ray.pt verbalized understanding.

## 2013-01-10 NOTE — Telephone Encounter (Signed)
Message copied by Jarvis Newcomer on Wed Jan 10, 2013  9:55 AM ------      Message from: Verdis Prime      Created: Tue Jan 09, 2013  5:30 PM       The labs are unremarkable. ------

## 2013-01-10 NOTE — Telephone Encounter (Signed)
pt given lab results.The labs are unremarkable.pt verbalized understanding.

## 2013-01-11 ENCOUNTER — Encounter (HOSPITAL_COMMUNITY)
Admission: RE | Disposition: A | Discharge status: Home / Self Care | Payer: Self-pay | Source: Ambulatory Visit | Attending: Interventional Cardiology

## 2013-01-11 ENCOUNTER — Ambulatory Visit (HOSPITAL_COMMUNITY)
Admission: RE | Admit: 2013-01-11 | Discharge: 2013-01-11 | Disposition: A | Discharge status: Home / Self Care | Payer: BC Managed Care – PPO | Source: Ambulatory Visit | Attending: Interventional Cardiology | Admitting: Interventional Cardiology

## 2013-01-11 DIAGNOSIS — E785 Hyperlipidemia, unspecified: Secondary | ICD-10-CM | POA: Insufficient documentation

## 2013-01-11 DIAGNOSIS — R0609 Other forms of dyspnea: Secondary | ICD-10-CM | POA: Insufficient documentation

## 2013-01-11 DIAGNOSIS — R0989 Other specified symptoms and signs involving the circulatory and respiratory systems: Secondary | ICD-10-CM | POA: Insufficient documentation

## 2013-01-11 DIAGNOSIS — R0789 Other chest pain: Secondary | ICD-10-CM

## 2013-01-11 DIAGNOSIS — I209 Angina pectoris, unspecified: Secondary | ICD-10-CM | POA: Diagnosis present

## 2013-01-11 DIAGNOSIS — I2510 Atherosclerotic heart disease of native coronary artery without angina pectoris: Secondary | ICD-10-CM | POA: Insufficient documentation

## 2013-01-11 DIAGNOSIS — Z87891 Personal history of nicotine dependence: Secondary | ICD-10-CM | POA: Insufficient documentation

## 2013-01-11 DIAGNOSIS — R079 Chest pain, unspecified: Secondary | ICD-10-CM | POA: Insufficient documentation

## 2013-01-11 DIAGNOSIS — E119 Type 2 diabetes mellitus without complications: Secondary | ICD-10-CM | POA: Insufficient documentation

## 2013-01-11 DIAGNOSIS — I10 Essential (primary) hypertension: Secondary | ICD-10-CM | POA: Insufficient documentation

## 2013-01-11 DIAGNOSIS — E669 Obesity, unspecified: Secondary | ICD-10-CM | POA: Insufficient documentation

## 2013-01-11 DIAGNOSIS — Z6838 Body mass index (BMI) 38.0-38.9, adult: Secondary | ICD-10-CM | POA: Insufficient documentation

## 2013-01-11 DIAGNOSIS — K219 Gastro-esophageal reflux disease without esophagitis: Secondary | ICD-10-CM | POA: Insufficient documentation

## 2013-01-11 DIAGNOSIS — R9439 Abnormal result of other cardiovascular function study: Secondary | ICD-10-CM

## 2013-01-11 HISTORY — PX: LEFT HEART CATHETERIZATION WITH CORONARY ANGIOGRAM: SHX5451

## 2013-01-11 LAB — PROTIME-INR
INR: 0.98 (ref 0.00–1.49)
Prothrombin Time: 12.8 seconds (ref 11.6–15.2)

## 2013-01-11 SURGERY — LEFT HEART CATHETERIZATION WITH CORONARY ANGIOGRAM
Anesthesia: LOCAL

## 2013-01-11 MED ORDER — SODIUM CHLORIDE 0.9 % IV SOLN: INTRAVENOUS | Status: DC

## 2013-01-11 MED ORDER — NITROGLYCERIN 0.2 MG/ML ON CALL CATH LAB
INTRAVENOUS | Status: AC
  Filled 2013-01-11: qty 1

## 2013-01-11 MED ORDER — DIAZEPAM 5 MG PO TABS
5.0000 mg | ORAL_TABLET | ORAL | Status: AC
  Administered 2013-01-11: 5 mg via ORAL

## 2013-01-11 MED ORDER — OXYCODONE-ACETAMINOPHEN 5-325 MG PO TABS: 1.0000 | ORAL_TABLET | ORAL | Status: DC | PRN

## 2013-01-11 MED ORDER — SODIUM CHLORIDE 0.9 % IJ SOLN: 3.0000 mL | Freq: Two times a day (BID) | INTRAMUSCULAR | Status: DC

## 2013-01-11 MED ORDER — ASPIRIN 81 MG PO CHEW
CHEWABLE_TABLET | ORAL | Status: AC
  Filled 2013-01-11: qty 1

## 2013-01-11 MED ORDER — FENTANYL CITRATE 0.05 MG/ML IJ SOLN
INTRAMUSCULAR | Status: AC
Start: 2013-01-11 — End: 2013-01-11
  Filled 2013-01-11: qty 2

## 2013-01-11 MED ORDER — LIDOCAINE HCL (PF) 1 % IJ SOLN
INTRAMUSCULAR | Status: AC
  Filled 2013-01-11: qty 30

## 2013-01-11 MED ORDER — SODIUM CHLORIDE 0.9 % IJ SOLN: 3.0000 mL | INTRAMUSCULAR | Status: DC | PRN

## 2013-01-11 MED ORDER — ASPIRIN 81 MG PO CHEW
81.0000 mg | CHEWABLE_TABLET | ORAL | Status: AC
  Administered 2013-01-11: 81 mg via ORAL

## 2013-01-11 MED ORDER — SODIUM CHLORIDE 0.9 % IV SOLN
INTRAVENOUS | Status: DC
  Administered 2013-01-11: 09:00:00 via INTRAVENOUS

## 2013-01-11 MED ORDER — VERAPAMIL HCL 2.5 MG/ML IV SOLN
INTRAVENOUS | Status: AC
  Filled 2013-01-11: qty 2

## 2013-01-11 MED ORDER — ONDANSETRON HCL 4 MG/2ML IJ SOLN: 4.0000 mg | Freq: Four times a day (QID) | INTRAMUSCULAR | Status: DC | PRN

## 2013-01-11 MED ORDER — ACETAMINOPHEN 325 MG PO TABS: 650.0000 mg | ORAL_TABLET | ORAL | Status: DC | PRN

## 2013-01-11 MED ORDER — MIDAZOLAM HCL 2 MG/2ML IJ SOLN
INTRAMUSCULAR | Status: AC
  Filled 2013-01-11: qty 2

## 2013-01-11 MED ORDER — HEPARIN (PORCINE) IN NACL 2-0.9 UNIT/ML-% IJ SOLN
INTRAMUSCULAR | Status: AC
  Filled 2013-01-11: qty 1500

## 2013-01-11 MED ORDER — SODIUM CHLORIDE 0.9 % IV SOLN: 250.0000 mL | INTRAVENOUS | Status: DC | PRN

## 2013-01-11 MED ORDER — HEPARIN SODIUM (PORCINE) 1000 UNIT/ML IJ SOLN
INTRAMUSCULAR | Status: AC
  Filled 2013-01-11: qty 1

## 2013-01-11 MED ORDER — DIAZEPAM 5 MG PO TABS
ORAL_TABLET | ORAL | Status: AC
  Filled 2013-01-11: qty 1

## 2013-01-11 NOTE — H&P (View-Only) (Signed)
[facility] SITE 3 NUCLEAR MED [address] [place], [place] [address] [phone]    Cardiology Nuclear Med Study  Heather Haley is a 52 y.o. female     MRN : [id number]     DOB: July 20, 1960  Procedure Date: 12/28/2012  Nuclear Med Background Indication for Stress Test:  Evaluation for Ischemia History:  No prior known history of CAD, '08 Myocardial Perfusion Imaging-Small Anterior Apical Ischemia, EF=77%> Cath: Ok per patient Cardiac Risk Factors: Family History - CAD, History of Smoking, Hypertension, Lipids and NIDDM  Symptoms: Chest Tightness with/without exertion (last occurrence last night), Palpitations and SOB   Nuclear Pre-Procedure Caffeine/Decaff Intake:  None NPO After: 8:00pm   Lungs:  clear O2 Sat: 94% on room air. IV 0.9% NS with Angio Cath:  22g  IV Site: L Antecubital x 1, tolerated well IV Started by:  Irean Hong, RN  Chest Size (in):  44 Cup Size: C  Height: 5\' 6"  (1.676 m)  Weight:  239 lb (108.41 kg)  BMI:  Body mass index is 38.59 kg/(m^2). Tech Comments:  No medications today    Nuclear Med Study 1 or 2 day study: 2 day  Stress Test Type:  Lexiscan  Reading MD: Verdis Prime, MD  Order Authorizing Provider:  Verdis Prime III, MD  Resting Radionuclide: Technetium 62m Sestamibi  Resting Radionuclide Dose: 33.0 mCi on 01-02-13  Stress Radionuclide:  Technetium 65m Sestamibi  Stress Radionuclide Dose: 33.0 mCi on 12-28-12          Stress Protocol Rest HR: 61 Stress HR: 79  Rest BP: 101/74 Stress BP: 111/77  Exercise Time (min): n/a METS: n/a   Predicted Max HR: 167 bpm % Max HR: 47.31 bpm Rate Pressure Product: 8769   Dose of Adenosine (mg):  n/a Dose of Lexiscan: 0.4 mg  Dose of Atropine (mg): n/a Dose of Dobutamine: n/a mcg/kg/min (at max HR)  Stress Test Technologist: Irean Hong, RN  Nuclear Technologist:  Dario Guardian, CNMT     Rest Procedure:  Myocardial perfusion imaging was performed at rest 45 minutes following the  intravenous administration of Technetium 46m Sestamibi. Rest ECG: Sinus bradycardia  Stress Procedure:  The patient received IV Lexiscan 0.4 mg over 15-seconds.  Technetium 66m Sestamibi injected at 30-seconds. The patient complained of chest tightness, SOB, pain (L) armpit, and headache with Lexiscan. Aminophylline 75 mg IVP given post recovery due to persistent headache with quick improvement of symptom. Quantitative spect images were obtained after a 45 minute delay. Stress ECG: No significant change from baseline ECG  QPS Raw Data Images:  There is a breast shadow that accounts for the anterior attenuation. Stress Images:  There is decreased uptake in the lateral wall. Rest Images:  Normal homogeneous uptake in all areas of the myocardium. Subtraction (SDS):  These findings are consistent with ischemia. Transient Ischemic Dilatation (Normal <1.22):  1.12 Lung/Heart Ratio (Normal <0.45):  0.56  Quantitative Gated Spect Images QGS EDV:  75 ml QGS ESV:  23 ml  Impression Exercise Capacity:  Lexiscan with no exercise. BP Response:  Normal blood pressure response. Clinical Symptoms:  Mild chest pain/dyspnea. ECG Impression:  No significant ST segment change suggestive of ischemia. Comparison with Prior Nuclear Study: No images to compare  Overall Impression:  Intermediate risk stress nuclear study with large region of lateral wall ischemia..  LV Ejection Fraction: 69%.  LV Wall Motion:  NL LV Function; NL Wall Motion

## 2013-01-11 NOTE — Interval H&P Note (Signed)
Cath Lab Visit (complete for each Cath Lab visit)  Clinical Evaluation Leading to the Procedure:   ACS: no  Non-ACS:    Anginal Classification: CCS III  Anti-ischemic medical therapy: Minimal Therapy (1 class of medications)  Non-Invasive Test Results: Moderate risk nuclear study  Prior CABG: No previous CABG      History and Physical Interval Note:  01/11/2013 12:18 PM  Heather Haley  has presented today for surgery, with the diagnosis of ABN STRESS TEST  The various methods of treatment have been discussed with the patient and family. After consideration of risks, benefits and other options for treatment, the patient has consented to  Procedure(s): LEFT HEART CATHETERIZATION WITH CORONARY ANGIOGRAM (N/A) as a surgical intervention .  The patient's history has been reviewed, patient examined, no change in status, stable for surgery.  I have reviewed the patient's chart and labs.  Questions were answered to the patient's satisfaction.     Heather Haley

## 2013-01-11 NOTE — CV Procedure (Signed)
     Left Heart Catheterization with Coronary Angiography Report  Heather Haley  52 y.o.  female 12/07/1960  Procedure Date: 01/11/2013  Referring Physician: Gilmore Laroche, M.D. Primary Cardiologist:: HWB [name], M.D.  INDICATIONS: Abnormal nuclear study, angina, and diabetes.  PROCEDURE: 1. Left heart catheterization; 2. Coronary angiography; 3. Left ventriculography  CONSENT:  The risks, benefits, and details of the procedure were explained in detail to the patient. Risks including death, stroke, heart attack, kidney injury, allergy, limb ischemia, bleeding and radiation injury were discussed.  The patient verbalized understanding and wanted to proceed.  Informed written consent was obtained.  PROCEDURE TECHNIQUE:  After Xylocaine anesthesia a 5 French slender sheath was placed in the right radial artery with a single anterior needle wall stick.  Coronary angiography was done using a 5 [place] JR 4 and 3.5 cm left Judkins catheters.  Left ventriculography was done using the JR 4 catheter via hand injection.   MEDICATIONS: Verapamil 6 mg intra-arterial; heparin 5000 units IV; Versed 2 mg IV; fentanyl 100 mcg IV.  CONTRAST:  Total of 55 cc.  COMPLICATIONS:  None   HEMODYNAMICS:  Aortic pressure 96/61 mmHg; LV pressure 109/1 mm mercury; LVEDP 10 mm mercury  ANGIOGRAPHIC DATA:   The left main coronary artery is short and widely patent..  The left anterior descending artery is reaches the left ventricular apex. A large bifurcating diagonal arises from the proximal LAD. Irregularities are noted in the proximal, mid, and distal LAD. The LAD reaches the left ventricular apex..  The left circumflex artery gives origin to a large circumflex trifurcates distally from its dominant obtuse marginal. No significant obstruction is noted to.  The right coronary artery is dominant and has mid vessel irregularity. No significant obstruction is noted.Marland Kitchen  LEFT VENTRICULOGRAM:  Left  ventricular angiogram was done in the 30 RAO projection and revealed normal size and contractility. EF 60%   IMPRESSIONS:  1. Nonobstructive coronary artery disease with luminal irregularities in the LAD/diagonal system and the right coronary. No obstruction greater than 30% is noted.  2. Normal left ventricular function, with LVEF 60%  3. False positive exercise treadmill test   RECOMMENDATION:  No further ischemic evaluation. Chest pain unlikely related to cardiac etiology. Continue aggressive risk factor modification to prevent downstream cardiovascular disease.

## 2013-01-11 NOTE — H&P (Signed)
Heather Haley is a 52 y.o. female  Admit Date: 01/11/2013 Referring Physician: Gilmore Laroche, M.D. Primary Cardiologist:: H.WB [name], M.D. Chief complaint: Angina and abnormal nuclear study  HPI: The patient has diabetes, hypertension, and hyperlipidemia. There is a several month history of exertional left chest and subclavicular discomfort. A nuclear perfusion study was performed because of risk factors and the quality of her history. He was felt to be intermediate risk demonstrating a large region of lateral wall infarction with ischemia peripherally. The patient states that since the nuclear study was that she has continued to have episodes of chest discomfort. She has had prior cardiac evaluation including a catheterization greater than 5 years ago that was unremarkable for obstructive disease.    PMH:    Past Medical History  Diagnosis Date  . Hypertension   . IBS (irritable bowel syndrome)   . Postoperative nausea and vomiting   . Diabetes mellitus   . Left ankle instability   . Headache(784.0)   . Left knee DJD   . Depression   . Frequency of urination   . GERD (gastroesophageal reflux disease)     PSH:    Past Surgical History  Procedure Laterality Date  . Ankle surgery    . Abdominal hysterectomy    . Knee arthroscopy  1997    left knee  . Total knee arthroplasty  08/30/2011    Procedure: TOTAL KNEE ARTHROPLASTY;  Surgeon: Nilda Simmer, MD;  Location[facility] OR;  Service: Orthopedics;  Laterality: Left;  . Colonoscopy  2013   ALLERGIES:   Codeine; Nortripytline hcl; Nsaids; and Penicillins Prior to Admit Meds:   No prescriptions prior to admission   Family HX:    Family History  Problem Relation Age of Onset  . Cancer Mother   . Hypertension Father   . Heart disease Father    Social HX:    History   Social History  . Marital Status: Divorced    Spouse Name: N/A    Number of Children: 2  . Years of Education: 11   Occupational History  .   [employer]   Social History Main Topics  . Smoking status: Former Smoker -- 2.00 packs/day for 15 years    Types: Cigarettes    Quit date: 02/09/1995  . Smokeless tobacco: Never Used  . Alcohol Use: No  . Drug Use: No  . Sexual Activity: Not Currently   Other Topics Concern  . Not on file   Social History Narrative   Patient lives at home alone. Patient works at [employer].   Caffeine daily- 2   Right handed.   Education- 11 th grade     ROS Denies smoking. No claudication. Denies palpitations and syncope. No orthopnea or PND. She denies blood in stool. No history of anemia per  Physical Exam: Blood pressure 102/67, pulse 65, temperature 97.8 F (36.6 C), temperature source Oral, resp. rate 20, height 5\' 5"  (1.651 m), weight 239 lb (108.41 kg), SpO2 97.00%.   Moderate obesity. No acute distress. HEENT exam is unremarkable. Neck exam reveals no bruits or JVD. Chest is clear rotation and percussion Cardiac exam reveals an S4 gallop otherwise unremarkable Abdomen is obese. Bowel sounds are normal. There is no tenderness. Extremities reveal no edema. Pulses are 2+ and symmetric upper and lower. Neurological exam is normal. Labs: Lab Results  Component Value Date   WBC 10.6* 01/08/2013   HGB 14.4 01/08/2013   HCT 43.5 01/08/2013  MCV 86.9 01/08/2013   PLT 298.0 01/08/2013    Recent Labs Lab 01/08/13 1114  NA 139  K 4.4  CL 105  CO2 23  BUN 15  CREATININE 1.0  CALCIUM 9.4  GLUCOSE 137*   Lab Results  Component Value Date   CKTOTAL 54 07/29/2006   CKMB 0.8 07/28/2006   TROPONINI  Value: 0.02        NO INDICATION OF MYOCARDIAL INJURY. 07/28/2006     Radiology:  Mild cardiomegaly  EKG:  No significant acute abnormality. Normal sinus rhythm  ASSESSMENT:  1. Recurring exertional chest pain with abnormal myocardial perfusion study  2. Diabetes mellitus  3. Obesity  4. Hypertension  5. Hyperlipidemia  Plan:  1. Diagnostic coronary angiography to  define anatomy and help guide therapy. The procedure and its attendant risks including stroke, death, myocardial infarction, bleeding, infection, allergy, kidney injury, among others were discussed in detail and except above the patient. We anticipated using the right radial approach. [name] 01/11/2013 4:41 PM

## 2013-01-16 ENCOUNTER — Telehealth: Payer: Self-pay | Admitting: Interventional Cardiology

## 2013-01-16 ENCOUNTER — Telehealth: Payer: Self-pay | Admitting: Neurology

## 2013-01-16 NOTE — Telephone Encounter (Signed)
pt instructed per Dr.Smith pt chest pain is not cardiac related and pt should f/u with her pcp for possible GI symptoms.pt verbalzied understanding.

## 2013-01-16 NOTE — Telephone Encounter (Signed)
Patient said that she is having severe abdominal pains,cramping,chest pains, short of breath, forgetfulness, headaches are worse.  She was off the medicine for 3 days for medical reason, felt much better , made a world of difference. Can she stop all at once or gradually?

## 2013-01-16 NOTE — Telephone Encounter (Signed)
Patient called stating that the side effects of Topiramate are getting to be too much for her and she would like to stop taking it. Please call.

## 2013-01-16 NOTE — Telephone Encounter (Signed)
New message  Patient would like to speak with you. She would like to know what the next step is in her care and the chest pain she is still having. Please call and advise

## 2013-01-17 NOTE — Telephone Encounter (Signed)
I have called her, she was taking topamax 100mg  bid, she complains of forgetful, abdominal cramping, chest tightness, she has been on topamax for more than 6 months, I am not sure that her symptoms are from topamax.  Her headaches have much improved, it is OK to taper off topamax 100mg  qhs x 3 day, then 1/2 po x 3 days, then stop.

## 2013-01-18 ENCOUNTER — Other Ambulatory Visit: Payer: Self-pay | Admitting: Family Medicine

## 2013-01-18 MED ORDER — HYDROCODONE-ACETAMINOPHEN 5-325 MG PO TABS: 1.0000 | ORAL_TABLET | Freq: Every day | ORAL | Status: DC | PRN

## 2013-01-18 NOTE — Telephone Encounter (Signed)
RX printed, left up front and patient aware to pick up per MyChart.

## 2013-01-29 ENCOUNTER — Other Ambulatory Visit: Payer: BC Managed Care – PPO | Admitting: Physician Assistant

## 2013-01-30 ENCOUNTER — Other Ambulatory Visit (HOSPITAL_COMMUNITY)
Admission: RE | Admit: 2013-01-30 | Discharge: 2013-01-30 | Disposition: A | Discharge status: Home / Self Care | Payer: BC Managed Care – PPO | Source: Ambulatory Visit | Attending: Obstetrics & Gynecology | Admitting: Obstetrics & Gynecology

## 2013-01-30 ENCOUNTER — Other Ambulatory Visit: Payer: Self-pay | Admitting: Family Medicine

## 2013-01-30 ENCOUNTER — Other Ambulatory Visit: Payer: Self-pay | Admitting: Obstetrics & Gynecology

## 2013-01-30 DIAGNOSIS — R87810 Cervical high risk human papillomavirus (HPV) DNA test positive: Secondary | ICD-10-CM | POA: Insufficient documentation

## 2013-01-30 DIAGNOSIS — N63 Unspecified lump in unspecified breast: Secondary | ICD-10-CM

## 2013-01-30 DIAGNOSIS — Z01419 Encounter for gynecological examination (general) (routine) without abnormal findings: Secondary | ICD-10-CM | POA: Insufficient documentation

## 2013-01-30 DIAGNOSIS — N760 Acute vaginitis: Secondary | ICD-10-CM | POA: Insufficient documentation

## 2013-01-30 DIAGNOSIS — Z1151 Encounter for screening for human papillomavirus (HPV): Secondary | ICD-10-CM | POA: Insufficient documentation

## 2013-01-30 MED ORDER — TRAMADOL HCL 50 MG PO TABS: 100.0000 mg | ORAL_TABLET | Freq: Three times a day (TID) | ORAL | Status: DC

## 2013-01-30 NOTE — Telephone Encounter (Signed)
Rx Refilled  

## 2013-02-07 ENCOUNTER — Other Ambulatory Visit: Payer: Self-pay | Admitting: Obstetrics & Gynecology

## 2013-02-07 ENCOUNTER — Ambulatory Visit
Admission: RE | Admit: 2013-02-07 | Discharge: 2013-02-07 | Disposition: A | Discharge status: Home / Self Care | Payer: Medicare Other | Source: Ambulatory Visit | Attending: Obstetrics & Gynecology | Admitting: Obstetrics & Gynecology

## 2013-02-07 DIAGNOSIS — N63 Unspecified lump in unspecified breast: Secondary | ICD-10-CM

## 2013-02-15 ENCOUNTER — Encounter: Payer: Self-pay | Admitting: Family Medicine

## 2013-03-05 ENCOUNTER — Other Ambulatory Visit: Payer: Self-pay | Admitting: Family Medicine

## 2013-03-05 NOTE — Telephone Encounter (Signed)
?   OK to Refill  

## 2013-03-05 NOTE — Telephone Encounter (Signed)
ok 

## 2013-03-08 ENCOUNTER — Encounter: Payer: Self-pay | Admitting: Sports Medicine

## 2013-03-08 ENCOUNTER — Ambulatory Visit (INDEPENDENT_AMBULATORY_CARE_PROVIDER_SITE_OTHER): Payer: Worker's Compensation | Admitting: Sports Medicine

## 2013-03-08 VITALS — Ht 65.0 in | Wt 245.0 lb

## 2013-03-08 DIAGNOSIS — M25579 Pain in unspecified ankle and joints of unspecified foot: Secondary | ICD-10-CM

## 2013-03-08 NOTE — Assessment & Plan Note (Signed)
Patient continues to have pain however, range of motion is improving. She'll followup with pain management for management of her chronic pain. To follow up with [place] as needed and continue to work on range of motion exercises.

## 2013-03-08 NOTE — Progress Notes (Signed)
Patient ID: Heather Haley, female   DOB: [birth year], 52 y.o.   MRN: [id number] 52 year old female with left ankle pain. She's been working on stretching with moderate improvement in range of motion. She continues to have pain. His appointment with the pain management specialists coming up and she's been unable to tolerate certain pain medications and accommodation of tramadol and occasional Vicodin has not provided significant relief. She continues to get improvement with ice. Overall reports mild improvement in range of motion and pain but not significant improvement in her discomfort. Her echo continues to affect her activities of daily living.  Review of systems as per history of present illness otherwise negative  Pertinent past medical history Hypertension  Examination: Ht 5\' 5"  (1.651 m)  Wt 245 lb (111.131 kg)  BMI 40.77 kg/m2 Well-developed obese 53 year old white female awake alert and oriented in no acute distress  Examination of left ankle Dorsiflexion between 5 and 7 plantarflexion 40 No significant swelling at this time Intact pulses

## 2013-03-22 ENCOUNTER — Other Ambulatory Visit: Payer: Self-pay | Admitting: Family Medicine

## 2013-03-22 MED ORDER — HYDROCODONE-ACETAMINOPHEN 5-325 MG PO TABS: 1.0000 | ORAL_TABLET | Freq: Every day | ORAL | Status: DC | PRN

## 2013-03-22 NOTE — Telephone Encounter (Signed)
Pt made aware Rx ready

## 2013-04-05 ENCOUNTER — Other Ambulatory Visit: Payer: Self-pay | Admitting: Family Medicine

## 2013-04-26 ENCOUNTER — Other Ambulatory Visit: Payer: Self-pay | Admitting: Family Medicine

## 2013-04-26 NOTE — Telephone Encounter (Signed)
Refill appropriate and filled per protocol. 

## 2013-04-27 ENCOUNTER — Telehealth: Payer: Self-pay | Admitting: *Deleted

## 2013-04-27 NOTE — Telephone Encounter (Signed)
Pt called to let Dr. Oneida Alar know that she no longer sees Dr. Dennard Schaumann as her PCP, she states he did not want to change her medications despite the side effects she said she was having from anti-depressant, and trouble sleeping.   She asked what medication Dr. Oneida Alar would recommend for her problems sleeping, and she will ask her new PCP about prescribing it for her.

## 2013-05-01 ENCOUNTER — Other Ambulatory Visit: Payer: Self-pay | Admitting: Family Medicine

## 2013-05-03 MED ORDER — HYDROCODONE-ACETAMINOPHEN 5-325 MG PO TABS: 1.0000 | ORAL_TABLET | Freq: Every day | ORAL | Status: DC | PRN

## 2013-05-03 NOTE — Telephone Encounter (Signed)
RX printed, left up front and patient aware to pick up via mychart  Pt made a comment to Dr. Oneida Alar on 04/27/13 (note in epic) that she was going to see a new PCP - Per Dr. Dennard Schaumann we will refill her medication for 1 month only which will give her time to get an appointment to see her new PCP. Pt is aware via mychart that we will no longer refill her medications after a month.

## 2013-05-03 NOTE — Telephone Encounter (Signed)
Per Dr. Oneida Alar- advised pt that he recommended amitriptyline or nortriptyline if she can tolerate these

## 2013-05-14 ENCOUNTER — Ambulatory Visit: Payer: BC Managed Care – PPO | Admitting: Neurology

## 2013-05-18 ENCOUNTER — Encounter: Payer: Self-pay | Admitting: Neurology

## 2013-05-18 ENCOUNTER — Other Ambulatory Visit: Payer: Self-pay | Admitting: Neurology

## 2013-05-18 ENCOUNTER — Ambulatory Visit (INDEPENDENT_AMBULATORY_CARE_PROVIDER_SITE_OTHER): Payer: BC Managed Care – PPO | Admitting: Neurology

## 2013-05-18 VITALS — BP 120/68 | HR 72 | Resp 16 | Ht 65.5 in | Wt 239.0 lb

## 2013-05-18 DIAGNOSIS — R51 Headache: Secondary | ICD-10-CM

## 2013-05-18 LAB — SEDIMENTATION RATE: Sed Rate: 16 mm/hr (ref 0–22)

## 2013-05-18 MED ORDER — SUMATRIPTAN SUCCINATE 100 MG PO TABS: ORAL_TABLET | ORAL | Status: DC

## 2013-05-18 MED ORDER — TOPIRAMATE 100 MG PO TABS: ORAL_TABLET | ORAL | Status: DC

## 2013-05-18 NOTE — Progress Notes (Signed)
NEUROLOGY CONSULTATION NOTE  Heather Haley MRN: [id number] DOB: 1960/12/18  Referring provider: Dr. Melanie Crazier [facility] Primary care provider: Dr. Melanie Crazier [facility]  Reason for consult:  Daily headaches  [name]:  Thank you for your kind referral of Heather Haley for consultation of the above symptoms. Although her history is well known to you, please allow me to reiterate it for the purpose of our medical record. Records and images were personally reviewed where available.  HISTORY OF PRESENT ILLNESS: This is a 53 year old right-handed woman with a history of hypertension, hyperlipidemia, diabetes, obesity, depression, left ankle surgery with chronic left ankle pain, knee replacement complicated by LE DVT, who started having headaches in December 2013.  She reports headaches occur on a daily basis, with left temporoparietal pressure that worsens over the course of the day, at times with severe shooting pain from the left frontal down to her neck, with half of her tongue going numb, and associated nausea and dizziness.  She tried Tylenol and Tramadol without relief. She was taking Tylenol three times a day and reduced this to twice a week after seeing neurologist Dr. Krista Blue. She was started on gabapentin but gained 20 pounds over 2 months.  She started Topamax which did help reduce the frequency of headaches.  Imitrex also helped within 30 minutes. She reports there is a dull pain right now, and some tenderness to palpation.  She started having severe abdominal pains,cramping,chest pains, short of breath, forgetfulness, worsened headaches, which she attributed to Topamax.  This was tapered off, and after stopping it for 2 months, headache frequency significantly increased.  She is now on a tapering schedule for Effexor, feeling much better, reporting that the symptoms were due to this medication and not Topamax. She has been taking Propranolol for the past 2 years  for essential tremor.  There is no family history of migraines or tremors.  She has poor sleep due to the ankle pain.  She endorses stress due to continued chronic ankle pain.    She denies any diplopia, dysarthria, dysphagia, vomiting, focal numbness/tingling/weakness, low back pain, bladder/bowel incontinence.  I personally reviewed head CT without contrast done 01/2012 which was unremarkable, no acute changes seen.  Laboratory Data: Lab Results  Component Value Date   WBC 10.6* 01/08/2013   HGB 14.4 01/08/2013   HCT 43.5 01/08/2013   MCV 86.9 01/08/2013   PLT 298.0 01/08/2013     Chemistry      Component Value Date/Time   NA 139 01/08/2013 1114   K 4.4 01/08/2013 1114   CL 105 01/08/2013 1114   CO2 23 01/08/2013 1114   BUN 15 01/08/2013 1114   CREATININE 1.0 01/08/2013 1114   CREATININE 0.82 06/19/2012 1259      Component Value Date/Time   CALCIUM 9.4 01/08/2013 1114   ALKPHOS 96 08/23/2011 0846   AST 55* 08/23/2011 0846   ALT 47* 08/23/2011 0846   BILITOT 0.4 08/23/2011 0846     Lab Results  Component Value Date   HGBA1C 6.1* 06/19/2012   PAST MEDICAL HISTORY: Past Medical History  Diagnosis Date  . Hypertension   . IBS (irritable bowel syndrome)   . Postoperative nausea and vomiting   . Diabetes mellitus   . Left ankle instability   . Headache(784.0)   . Left knee DJD   . Depression   . Frequency of urination   . GERD (gastroesophageal reflux disease)     PAST SURGICAL HISTORY: Past  Surgical History  Procedure Laterality Date  . Ankle surgery    . Abdominal hysterectomy    . Knee arthroscopy  1997    left knee  . Total knee arthroplasty  08/30/2011    Procedure: TOTAL KNEE ARTHROPLASTY;  Surgeon: Lorn Junes, MD;  Location: [facility];  Service: Orthopedics;  Laterality: Left;  . Colonoscopy  2013    MEDICATIONS: Current Outpatient Prescriptions on File Prior to Visit  Medication Sig Dispense Refill  . aspirin EC 81 MG tablet Take 81 mg by mouth daily.      .  cetirizine (ZYRTEC) 10 MG tablet Take 10 mg by mouth at bedtime.      . fluticasone (FLONASE) 50 MCG/ACT nasal spray Place 2 sprays into both nostrils daily.      Marland Kitchen glipiZIDE (GLUCOTROL XL) 5 MG 24 hr tablet Take 5 mg by mouth at bedtime.      Marland Kitchen HYDROcodone-acetaminophen (NORCO/VICODIN) 5-325 MG per tablet Take 1 tablet by mouth daily as needed.  30 tablet  0  . Hypromellose (ARTIFICIAL TEARS OP) Apply 2 drops to eye daily as needed (for dry eyes). Dry eye      . pravastatin (PRAVACHOL) 80 MG tablet Take 80 mg by mouth at bedtime.      . propranolol ER (INDERAL LA) 80 MG 24 hr capsule Take 80 mg by mouth every morning.      . traMADol (ULTRAM) 50 MG tablet TAKE 2 TABLETS BY MOUTH THREE TIMES DAILY AS NEEDED FOR PAIN.  180 tablet  2  . venlafaxine XR (EFFEXOR-XR) 150 MG 24 hr capsule Take 150 mg by mouth daily with breakfast.       No current facility-administered medications on file prior to visit.    ALLERGIES: Allergies  Allergen Reactions  . Codeine     REACTION: hives  . Nortripytline Hcl [Nortriptyline]     Hair loss and night mares  . Nsaids     REACTION: palpitations, diaphoresis  . Penicillins     REACTION: upset stomach    FAMILY HISTORY: Family History  Problem Relation Age of Onset  . Cancer Mother   . Hypertension Father   . Heart disease Father     SOCIAL HISTORY: History   Social History  . Marital Status: Divorced    Spouse Name: N/A    Number of Children: 2  . Years of Education: 11   Occupational History  .  [place] History Main Topics  . Smoking status: Former Smoker -- 2.00 packs/day for 15 years    Types: Cigarettes    Quit date: 02/09/1995  . Smokeless tobacco: Never Used  . Alcohol Use: No  . Drug Use: No  . Sexual Activity: Not Currently   Other Topics Concern  . Not on file   Social History Narrative   Patient lives at home alone. Patient works at [employer].   Caffeine daily- 2   Right handed.   Education- 11 th  grade    REVIEW OF SYSTEMS: Constitutional: No fevers, chills, or sweats, no generalized fatigue, change in appetite Eyes: No visual changes, double vision, eye pain Ear, nose and throat: No hearing loss, ear pain, nasal congestion, sore throat Cardiovascular: No chest pain, palpitations Respiratory:  No shortness of breath at rest or with exertion, wheezes GastrointestinaI: No nausea, vomiting, diarrhea, abdominal pain, fecal incontinence Genitourinary:  No dysuria, urinary retention or frequency Musculoskeletal:  + neck pain, no back pain Integumentary: No rash, pruritus,  skin lesions Neurological: as above Psychiatric: No depression, insomnia, anxiety Endocrine: No palpitations, fatigue, diaphoresis, mood swings, change in appetite, change in weight, increased thirst Hematologic/Lymphatic:  No anemia, purpura, petechiae. Allergic/Immunologic: no itchy/runny eyes, nasal congestion, recent allergic reactions, rashes  PHYSICAL EXAM: Filed Vitals:   05/18/13 0749  BP: 120/68  Pulse: 72  Resp: 16   General: No acute distress Head:  Normocephalic/atraumatic. No temporal artery tenderness or ropiness. Neck: supple, no paraspinal tenderness, full range of motion Back: No paraspinal tenderness Heart: regular rate and rhythm Lungs: Clear to auscultation bilaterally. Vascular: No carotid bruits. Skin/Extremities: No rash, no edema. Left calf brace with scar on the left knee and lateral ankle Neurological Exam: Mental status: alert and oriented to person, place, and time, no dysarthria or aphasia, Fund of knowledge is appropriate.  Recent and remote memory are intact.  Attention and concentration are normal.    Able to name objects and repeat phrases. Cranial nerves: CN I: not tested CN II: pupils equal, round and reactive to light, visual fields intact, fundi unremarkable. CN III, IV, VI:  full range of motion, no nystagmus, no ptosis CN V: facial sensation intact CN VII: upper and  lower face symmetric CN VIII: hearing intact CN IX, X: gag intact, uvula midline CN XI: sternocleidomastoid and trapezius muscles intact CN XII: tongue midline Bulk & Tone: normal, no fasciculations. Motor: 5/5 throughout with no pronator drift. Sensation: decreased cold sensation on the left LE, intact to pin.  Intact to light touch, cold, pin on both UE, intact to vibration and joint position sense.  No extinction to double simultaneous stimulation.  Romberg test negative Deep Tendon Reflexes: +2 throughout except for absent ankle jerks bilaterally, no ankle clonus Plantar responses: downgoing bilaterally Cerebellar: no incoordination on finger to nose Gait: narrow-based and steady, able to tandem walk adequately. Tremor: no resting or postural tremor. + bilateral low amplitude high frequency action tremor L>R  IMPRESSION: This is a 53 year old right-handed woman with a history of hypertension, hyperlipidemia, diabetes, obesity, chronic left ankle pain, and new onset daily persistent headaches since December 2013. There are some migrainous features to her headaches.  Check ESR and CRP, although temporal arteritis is less likely. She has noticed an improvement with Topamax.  Dose will be increased to 128m BID.  Continue Propranolol dose for now, this may be uptitrated as well for headache prophylaxis. Refills for Imitrex sent today.  We discussed medication overuse headaches, she knows to minimize rescue medications to 2-3 a week to help avoid this.  She will keep a headache calendar and follow-up in 3 months.  Thank you for allowing me to participate in the care of this patient. Please do not hesitate to call for any questions or concerns.   KEllouise Newer M.D.

## 2013-05-18 NOTE — Patient Instructions (Signed)
1. Increase Topamax 100mg : Take 1 tab in AM, 1-1/2 tab in PM for 1 week, then increase to 1-1/2 tablets twice a day 2. Take sumatriptan only as needed for severe headaches, do not take more than 2-3 a week. 3. Keep a headache calendar 4. Follow-up in 3 months

## 2013-05-19 LAB — C-REACTIVE PROTEIN: CRP: 1.6 mg/dL — AB (ref <0.60)

## 2013-05-21 ENCOUNTER — Encounter: Payer: Self-pay | Admitting: Neurology

## 2013-05-24 ENCOUNTER — Telehealth: Payer: Self-pay | Admitting: Neurology

## 2013-05-24 ENCOUNTER — Encounter: Payer: Self-pay | Admitting: Neurology

## 2013-05-24 DIAGNOSIS — R51 Headache: Secondary | ICD-10-CM

## 2013-05-24 NOTE — Telephone Encounter (Signed)
It looks like results are available today. Pt is very anxious to get results. Please call patient with labs ASAP / Sherri S.

## 2013-05-24 NOTE — Addendum Note (Signed)
Addended byAnnamaria Helling on: 05/24/2013 04:21 PM   Modules accepted: Orders

## 2013-05-24 NOTE — Telephone Encounter (Signed)
Spoke to patient regarding test results, her ESR is normal, CRP slightly elevated.  Would like to repeat CRP to confirm if true or lab error. Patient asked about INR and if this could cause headaches, she is not on Coumadin to necessitate an INR, which I explained to her, and that this is not a test for headaches.  She expressed understanding.

## 2013-05-24 NOTE — Telephone Encounter (Signed)
Order placed and lab slip at the front office for patient pick up.

## 2013-05-25 ENCOUNTER — Ambulatory Visit: Payer: BC Managed Care – PPO | Admitting: Neurology

## 2013-05-25 LAB — C-REACTIVE PROTEIN: CRP: 0.9 mg/dL — AB (ref <0.60)

## 2013-05-29 ENCOUNTER — Encounter: Payer: Self-pay | Admitting: Family Medicine

## 2013-05-29 ENCOUNTER — Ambulatory Visit (HOSPITAL_COMMUNITY): Payer: Worker's Compensation | Attending: Cardiology | Admitting: Cardiology

## 2013-05-29 ENCOUNTER — Other Ambulatory Visit: Payer: Self-pay | Admitting: Family Medicine

## 2013-05-29 ENCOUNTER — Telehealth: Payer: Self-pay | Admitting: Neurology

## 2013-05-29 DIAGNOSIS — M25569 Pain in unspecified knee: Secondary | ICD-10-CM

## 2013-05-29 DIAGNOSIS — M24873 Other specific joint derangements of unspecified ankle, not elsewhere classified: Secondary | ICD-10-CM

## 2013-05-29 DIAGNOSIS — M25579 Pain in unspecified ankle and joints of unspecified foot: Secondary | ICD-10-CM

## 2013-05-29 DIAGNOSIS — M7989 Other specified soft tissue disorders: Secondary | ICD-10-CM | POA: Diagnosis not present

## 2013-05-29 DIAGNOSIS — R609 Edema, unspecified: Secondary | ICD-10-CM | POA: Diagnosis not present

## 2013-05-29 DIAGNOSIS — M24876 Other specific joint derangements of unspecified foot, not elsewhere classified: Secondary | ICD-10-CM

## 2013-05-29 DIAGNOSIS — M79609 Pain in unspecified limb: Secondary | ICD-10-CM | POA: Diagnosis not present

## 2013-05-29 DIAGNOSIS — R0602 Shortness of breath: Secondary | ICD-10-CM | POA: Insufficient documentation

## 2013-05-29 NOTE — Progress Notes (Signed)
Lower venous duplex bilateral complete 

## 2013-05-29 NOTE — Telephone Encounter (Signed)
Message copied by Annamaria Helling on Tue May 29, 2013  4:09 PM ------      Message from: Cameron Sprang      Created: Tue May 29, 2013  3:31 PM       Pls let her know the repeat bloodwork is better and unremarkable now.  Thanks ------

## 2013-05-29 NOTE — Telephone Encounter (Signed)
Patient made aware blood results better. She will call with any questions.

## 2013-05-31 ENCOUNTER — Other Ambulatory Visit: Payer: Self-pay | Admitting: Family Medicine

## 2013-05-31 ENCOUNTER — Ambulatory Visit: Payer: BC Managed Care – PPO | Admitting: Neurology

## 2013-05-31 MED ORDER — TRAMADOL HCL 50 MG PO TABS: ORAL_TABLET | ORAL | Status: DC

## 2013-05-31 MED ORDER — CETIRIZINE HCL 10 MG PO TABS: 10.0000 mg | ORAL_TABLET | Freq: Every day | ORAL | Status: DC

## 2013-05-31 NOTE — Telephone Encounter (Signed)
Already forwarded  to sandy.

## 2013-05-31 NOTE — Telephone Encounter (Signed)
Rx Refilled - ok'd by Dr. Dennard Schaumann

## 2013-06-14 ENCOUNTER — Ambulatory Visit
Admission: RE | Admit: 2013-06-14 | Discharge: 2013-06-14 | Disposition: A | Discharge status: Home / Self Care | Payer: BC Managed Care – PPO | Source: Ambulatory Visit | Attending: Gastroenterology | Admitting: Gastroenterology

## 2013-06-14 ENCOUNTER — Other Ambulatory Visit: Payer: Self-pay | Admitting: Gastroenterology

## 2013-06-14 DIAGNOSIS — R109 Unspecified abdominal pain: Secondary | ICD-10-CM

## 2013-06-14 DIAGNOSIS — K5900 Constipation, unspecified: Secondary | ICD-10-CM

## 2013-06-22 ENCOUNTER — Other Ambulatory Visit: Payer: Self-pay | Admitting: Gastroenterology

## 2013-06-26 ENCOUNTER — Telehealth: Payer: Self-pay | Admitting: Interventional Cardiology

## 2013-06-26 ENCOUNTER — Ambulatory Visit (INDEPENDENT_AMBULATORY_CARE_PROVIDER_SITE_OTHER): Payer: BC Managed Care – PPO | Admitting: Neurology

## 2013-06-26 ENCOUNTER — Encounter (INDEPENDENT_AMBULATORY_CARE_PROVIDER_SITE_OTHER): Payer: Self-pay

## 2013-06-26 ENCOUNTER — Encounter: Payer: Self-pay | Admitting: Neurology

## 2013-06-26 DIAGNOSIS — F3289 Other specified depressive episodes: Secondary | ICD-10-CM

## 2013-06-26 DIAGNOSIS — I10 Essential (primary) hypertension: Secondary | ICD-10-CM

## 2013-06-26 DIAGNOSIS — G4710 Hypersomnia, unspecified: Secondary | ICD-10-CM

## 2013-06-26 DIAGNOSIS — F32A Depression, unspecified: Secondary | ICD-10-CM

## 2013-06-26 DIAGNOSIS — R51 Headache: Secondary | ICD-10-CM

## 2013-06-26 DIAGNOSIS — F329 Major depressive disorder, single episode, unspecified: Secondary | ICD-10-CM

## 2013-06-26 MED ORDER — RIZATRIPTAN BENZOATE 5 MG PO TBDP: 5.0000 mg | ORAL_TABLET | ORAL | Status: DC | PRN

## 2013-06-26 NOTE — Telephone Encounter (Signed)
Follow up  ° ° ° °Returning call back to nurse  °

## 2013-06-26 NOTE — Progress Notes (Signed)
History of Present Illness:   Heather Haley is a 53 years old right-handed Caucasian female, referred by her primary care physician Dr. Leontine Locket for evaluation of headaches  She had a past medical history of depression, obesity, diabetes, hypertension, hyperlipidemia, had multiple left ankle surgery, most recent surgery was in 2009, with chronic left ankle pain, wearing left ankle brace, left knee replacement July 5400, complicated by left lower extremity DVT, was temporarily treated with Coumadin.  She denies a previous history of headaches, over the past 3 months, since early 2014, she had almost daily headaches, woke up with a left temporoparietal area pressure sensation, getting worse as day goes by, by the evening time, it is often exacerbated to a moderate to severe pounding headaches, with associated light sensitivity, worsening by movement, she has tried Tylenol, tramadol, without much help,  She was put on gabapentin 600 mg 3 times a day, with mild help, she reports 20 pounds weight gain over past 2 months, there are some daytime fatigue, and sleepiness, Epworth sleeping scale is 8 today, she denies excessive snoring,   UPDATE July 15th 2014:  She continues to have frequent headaches, this started about 6 months ago, getting worse since June, she has 1-2 headaches each week, left temporal region dull achy pain, with left facial pain, numbness, lasting for few hours, Imitrex was helping within 30 minutes.  She has associated blurry vision, staggering during headaches.  She had sleep study,: reached a nadir of 85%, desaturations worsened during last hour of two of the night when patient seemed to be getting better sleep but worsening periods of hypopnea. But she did not return for titration test.  She continued to have left ankle pain, wear left ankle bracelet  UPDATE 12/15/2012: Her headache is less frequent, has 1-2 each month, but her headache lasted longer, more severe, left retrorbital, with left  ear ringing, light, noise sensitivity, imitrex helped some times,  Topamax seems to help her some.   UPDATE May 19th 2015: She continues to complain almost daily headaches, left frontal, retro-orbital area severe pounding headache with associated light and noise sensitivity, she stopped the Topamax couple months ago, realized that her headache has become more frequent, she is back on Topamax 100 mg 1 and half tablets twice a day, she is also taking Imitrex as needed, which does not help her headache much  Physical Exam  Neck: supple no carotid bruits Respiratory: clear to auscultation bilaterally Cardiovascular: regular rate rhythm  Neurologic Exam  Mental Status: pleasant, awake, alert, cooperative to history, talking, and casual conversation. Cranial Nerves: CN II-XII pupils were equal round reactive to light.  Fundi were sharp bilaterally.  Extraocular movements were full.  Visual fields were full on confrontational test.  Facial sensation and strength were normal.  Hearing was intact to finger rubbing bilaterally.  Uvula tongue were midline.  Head turning and shoulder shrugging were normal and symmetric.  Tongue protrusion into the cheeks strength were normal.  Motor: Normal tone, bulk, and strength, left ankle lateral surgical scar. Sensory: Normal to light touch, pinprick, proprioception, and vibratory sensation. Coordination: Normal finger-to-nose, heel-to-shin.  There was no dysmetria noticed. Gait and Station: wear left ankle brace, mildly limp.  Romberg sign: Negative Reflexes: Deep tendon reflexes: Biceps: 2/2, Brachioradialis: 2/2, Triceps: 2/2, Pateller: 2/2, Achilles: trace.  Plantar responses are flexor.   Assessment and Plan:  53 years old Caucasian female, presenting with new onset headaches, normal neurological examination, and normal CT scan but despite continued titrating dose of  Topamax, her headache instead of getting better, is getting worse,,,  1. Her headache has  migraine features, Likely complicated by her mood disorder, need to rule out structural lesion,  2.  increase  preventive medication topiramate 100mg to one and half tablet twice a day 3. Maxalt as needed  4.RTC in  3-4  months

## 2013-06-26 NOTE — Telephone Encounter (Signed)
New Message  Pt called. Requests a call back to discuss if a stent was put in during her Cath. Please call

## 2013-06-26 NOTE — Telephone Encounter (Signed)
returned pt call.pt adv that she did not have a stent placed after her cardiac cath, there were no significant blockage.pt verbalized understanding.

## 2013-06-26 NOTE — Telephone Encounter (Signed)
returned pt lmom for pt to call back

## 2013-06-27 ENCOUNTER — Ambulatory Visit: Payer: Self-pay | Admitting: Pain Medicine

## 2013-07-04 ENCOUNTER — Other Ambulatory Visit: Payer: BC Managed Care – PPO

## 2013-09-14 ENCOUNTER — Ambulatory Visit: Payer: Self-pay | Admitting: Pain Medicine

## 2013-09-14 LAB — COMPREHENSIVE METABOLIC PANEL
Albumin: 3.9 g/dL (ref 3.4–5.0)
Alkaline Phosphatase: 83 U/L
Anion Gap: 6 — ABNORMAL LOW (ref 7–16)
BUN: 8 mg/dL (ref 7–18)
Bilirubin,Total: 0.4 mg/dL (ref 0.2–1.0)
Calcium, Total: 8.7 mg/dL (ref 8.5–10.1)
Chloride: 109 mmol/L — ABNORMAL HIGH (ref 98–107)
Co2: 26 mmol/L (ref 21–32)
Creatinine: 1.17 mg/dL (ref 0.60–1.30)
EGFR (African American): >60
EGFR (Non-African Amer.): 53 — ABNORMAL LOW
Glucose: 95 mg/dL (ref 65–99)
Osmolality: 279 (ref 275–301)
Potassium: 4.5 mmol/L (ref 3.5–5.1)
SGOT(AST): 32 U/L (ref 15–37)
SGPT (ALT): 33 U/L
Sodium: 141 mmol/L (ref 136–145)
Total Protein: 8.3 g/dL — ABNORMAL HIGH (ref 6.4–8.2)

## 2013-09-14 LAB — SEDIMENTATION RATE: Erythrocyte Sed Rate: 8 mm/hr (ref 0–30)

## 2013-09-14 LAB — HEPATIC FUNCTION PANEL A (ARMC): Bilirubin, Direct: 0.1 mg/dL (ref 0.00–0.20)

## 2013-09-14 LAB — MAGNESIUM: MAGNESIUM: 1.9 mg/dL

## 2013-09-18 ENCOUNTER — Ambulatory Visit: Payer: Self-pay | Admitting: Pain Medicine

## 2013-09-19 ENCOUNTER — Ambulatory Visit: Payer: BC Managed Care – PPO | Admitting: Neurology

## 2013-09-23 ENCOUNTER — Other Ambulatory Visit: Payer: Self-pay | Admitting: Family Medicine

## 2013-09-24 NOTE — Telephone Encounter (Signed)
Ok to refill??  Last office visit 12/26/2012.  Last refill 05/31/2013, #2 refills.   Last patient e-mail states that she is supposed to be going to pain management???

## 2013-09-24 NOTE — Telephone Encounter (Signed)
I though patient transferred due to dissatisfaction?

## 2013-10-02 ENCOUNTER — Telehealth: Payer: Self-pay | Admitting: Internal Medicine

## 2013-10-10 NOTE — Telephone Encounter (Signed)
Referral declined and pt informed.  Records being shreaded

## 2013-10-19 ENCOUNTER — Telehealth: Payer: Self-pay | Admitting: *Deleted

## 2013-10-19 ENCOUNTER — Other Ambulatory Visit: Payer: Self-pay | Admitting: *Deleted

## 2013-10-19 DIAGNOSIS — M25572 Pain in left ankle and joints of left foot: Secondary | ICD-10-CM

## 2013-10-19 MED ORDER — TRAMADOL HCL 50 MG PO TABS: ORAL_TABLET | ORAL | Status: DC

## 2013-10-19 NOTE — Telephone Encounter (Signed)
Message copied by Laurey Arrow on Fri Oct 19, 2013  2:59 PM ------      Message from: Babette Relic      Created: Fri Oct 19, 2013 10:27 AM      Regarding: Refill      Contact: [phone]       Having difficulty getting Tramadol.  Would you be able to write her a prescription.  [facility] on [address].  Has enough to get to next week.  If you need to work her in she will definitely come. ------

## 2013-10-24 DIAGNOSIS — Z8719 Personal history of other diseases of the digestive system: Secondary | ICD-10-CM | POA: Insufficient documentation

## 2013-10-24 DIAGNOSIS — K5909 Other constipation: Secondary | ICD-10-CM | POA: Insufficient documentation

## 2013-10-29 ENCOUNTER — Ambulatory Visit: Payer: BC Managed Care – PPO | Admitting: Neurology

## 2013-11-14 ENCOUNTER — Ambulatory Visit: Payer: Self-pay | Admitting: Pain Medicine

## 2013-11-15 ENCOUNTER — Ambulatory Visit: Payer: Self-pay | Admitting: Pain Medicine

## 2013-11-16 ENCOUNTER — Ambulatory Visit: Payer: Self-pay | Admitting: Gastroenterology

## 2013-12-03 ENCOUNTER — Ambulatory Visit: Payer: Self-pay | Admitting: Pain Medicine

## 2013-12-13 ENCOUNTER — Ambulatory Visit: Payer: Self-pay | Admitting: Gastroenterology

## 2013-12-18 ENCOUNTER — Other Ambulatory Visit: Payer: Self-pay | Admitting: Neurology

## 2013-12-24 ENCOUNTER — Ambulatory Visit: Payer: Self-pay | Admitting: Surgery

## 2013-12-25 ENCOUNTER — Ambulatory Visit: Payer: Self-pay | Admitting: Surgery

## 2013-12-26 LAB — BASIC METABOLIC PANEL
Anion Gap: 6 — ABNORMAL LOW (ref 7–16)
BUN: 5 mg/dL — ABNORMAL LOW (ref 7–18)
CHLORIDE: 111 mmol/L — AB (ref 98–107)
CO2: 25 mmol/L (ref 21–32)
CREATININE: 0.97 mg/dL (ref 0.60–1.30)
Calcium, Total: 9 mg/dL (ref 8.5–10.1)
EGFR (African American): >60
EGFR (Non-African Amer.): >60
GLUCOSE: 103 mg/dL — AB (ref 65–99)
Osmolality: 281 (ref 275–301)
Potassium: 4 mmol/L (ref 3.5–5.1)
Sodium: 142 mmol/L (ref 136–145)

## 2013-12-26 LAB — CBC WITH DIFFERENTIAL/PLATELET
BASOS ABS: 0 10*3/uL (ref 0.0–0.1)
BASOS PCT: 0.5 %
Eosinophil #: 0.2 10*3/uL (ref 0.0–0.7)
Eosinophil %: 1.6 %
HCT: 42.7 % (ref 35.0–47.0)
HGB: 13.5 g/dL (ref 12.0–16.0)
LYMPHS ABS: 3.3 10*3/uL (ref 1.0–3.6)
LYMPHS PCT: 31.4 %
MCH: 28.5 pg (ref 26.0–34.0)
MCHC: 31.6 g/dL — AB (ref 32.0–36.0)
MCV: 90 fL (ref 80–100)
MONOS PCT: 4.8 %
Monocyte #: 0.5 x10 3/mm (ref 0.2–0.9)
NEUTROS PCT: 61.7 %
Neutrophil #: 6.5 10*3/uL (ref 1.4–6.5)
Platelet: 245 10*3/uL (ref 150–440)
RBC: 4.73 10*6/uL (ref 3.80–5.20)
RDW: 13.5 % (ref 11.5–14.5)
WBC: 10.5 10*3/uL (ref 3.6–11.0)

## 2013-12-26 LAB — TROPONIN I: Troponin-I: <0.02 ng/mL

## 2014-01-08 ENCOUNTER — Other Ambulatory Visit: Payer: Self-pay

## 2014-01-08 DIAGNOSIS — Z1231 Encounter for screening mammogram for malignant neoplasm of breast: Secondary | ICD-10-CM

## 2014-01-09 ENCOUNTER — Emergency Department (HOSPITAL_COMMUNITY)
Admission: EM | Admit: 2014-01-09 | Discharge: 2014-01-09 | Disposition: A | Discharge status: Home / Self Care | Payer: No Typology Code available for payment source | Attending: Emergency Medicine | Admitting: Emergency Medicine

## 2014-01-09 ENCOUNTER — Encounter (HOSPITAL_COMMUNITY): Payer: Self-pay | Admitting: Family Medicine

## 2014-01-09 DIAGNOSIS — I10 Essential (primary) hypertension: Secondary | ICD-10-CM | POA: Insufficient documentation

## 2014-01-09 DIAGNOSIS — Y998 Other external cause status: Secondary | ICD-10-CM | POA: Insufficient documentation

## 2014-01-09 DIAGNOSIS — Z79899 Other long term (current) drug therapy: Secondary | ICD-10-CM | POA: Insufficient documentation

## 2014-01-09 DIAGNOSIS — Z87891 Personal history of nicotine dependence: Secondary | ICD-10-CM | POA: Insufficient documentation

## 2014-01-09 DIAGNOSIS — S199XXA Unspecified injury of neck, initial encounter: Secondary | ICD-10-CM | POA: Diagnosis not present

## 2014-01-09 DIAGNOSIS — Y92410 Unspecified street and highway as the place of occurrence of the external cause: Secondary | ICD-10-CM | POA: Diagnosis not present

## 2014-01-09 DIAGNOSIS — E119 Type 2 diabetes mellitus without complications: Secondary | ICD-10-CM | POA: Insufficient documentation

## 2014-01-09 DIAGNOSIS — Z7982 Long term (current) use of aspirin: Secondary | ICD-10-CM | POA: Insufficient documentation

## 2014-01-09 DIAGNOSIS — Y9389 Activity, other specified: Secondary | ICD-10-CM | POA: Insufficient documentation

## 2014-01-09 DIAGNOSIS — Z7951 Long term (current) use of inhaled steroids: Secondary | ICD-10-CM | POA: Diagnosis not present

## 2014-01-09 DIAGNOSIS — F329 Major depressive disorder, single episode, unspecified: Secondary | ICD-10-CM | POA: Diagnosis not present

## 2014-01-09 DIAGNOSIS — K219 Gastro-esophageal reflux disease without esophagitis: Secondary | ICD-10-CM | POA: Insufficient documentation

## 2014-01-09 DIAGNOSIS — Z880 Allergy status to penicillin: Secondary | ICD-10-CM | POA: Insufficient documentation

## 2014-01-09 DIAGNOSIS — K589 Irritable bowel syndrome without diarrhea: Secondary | ICD-10-CM | POA: Diagnosis not present
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
## 2014-01-09 NOTE — Discharge Instructions (Signed)

## 2014-01-17 ENCOUNTER — Encounter (HOSPITAL_COMMUNITY): Payer: Self-pay | Admitting: Interventional Cardiology

## 2014-02-06 ENCOUNTER — Other Ambulatory Visit: Payer: Self-pay | Admitting: Internal Medicine

## 2014-02-06 DIAGNOSIS — R10811 Right upper quadrant abdominal tenderness: Secondary | ICD-10-CM

## 2014-02-11 ENCOUNTER — Ambulatory Visit: Payer: Self-pay

## 2014-02-13 ENCOUNTER — Other Ambulatory Visit: Payer: Self-pay

## 2014-02-14 ENCOUNTER — Ambulatory Visit
Admission: RE | Admit: 2014-02-14 | Discharge: 2014-02-14 | Disposition: A | Discharge status: Home / Self Care | Payer: Medicare Other | Source: Ambulatory Visit | Attending: Internal Medicine | Admitting: Internal Medicine

## 2014-02-14 DIAGNOSIS — R10811 Right upper quadrant abdominal tenderness: Secondary | ICD-10-CM

## 2014-02-18 ENCOUNTER — Other Ambulatory Visit: Payer: Self-pay | Admitting: *Deleted

## 2014-02-18 MED ORDER — TRAMADOL HCL 50 MG PO TABS: ORAL_TABLET | ORAL | Status: DC

## 2014-02-20 ENCOUNTER — Ambulatory Visit
Admission: RE | Admit: 2014-02-20 | Discharge: 2014-02-20 | Disposition: A | Discharge status: Home / Self Care | Payer: Medicare Other | Source: Ambulatory Visit

## 2014-02-20 DIAGNOSIS — Z1231 Encounter for screening mammogram for malignant neoplasm of breast: Secondary | ICD-10-CM

## 2014-02-22 ENCOUNTER — Other Ambulatory Visit: Payer: Self-pay | Admitting: Physician Assistant

## 2014-03-12 ENCOUNTER — Emergency Department: Payer: Self-pay | Admitting: Emergency Medicine

## 2014-03-12 LAB — COMPREHENSIVE METABOLIC PANEL
Albumin: 4 g/dL (ref 3.4–5.0)
Alkaline Phosphatase: 92 U/L (ref 46–116)
Anion Gap: 10 (ref 7–16)
BUN: 14 mg/dL (ref 7–18)
Bilirubin,Total: 0.4 mg/dL (ref 0.2–1.0)
CHLORIDE: 107 mmol/L (ref 98–107)
Calcium, Total: 9.4 mg/dL (ref 8.5–10.1)
Co2: 22 mmol/L (ref 21–32)
Creatinine: 1.01 mg/dL (ref 0.60–1.30)
EGFR (Non-African Amer.): >60
GLUCOSE: 79 mg/dL (ref 65–99)
Osmolality: 277 (ref 275–301)
Potassium: 3.9 mmol/L (ref 3.5–5.1)
SGOT(AST): 34 U/L (ref 15–37)
SGPT (ALT): 25 U/L (ref 14–63)
Sodium: 139 mmol/L (ref 136–145)
Total Protein: 8.3 g/dL — ABNORMAL HIGH (ref 6.4–8.2)

## 2014-03-12 LAB — CBC
HCT: 46.3 % (ref 35.0–47.0)
HGB: 14.8 g/dL (ref 12.0–16.0)
MCH: 28.3 pg (ref 26.0–34.0)
MCHC: 31.8 g/dL — ABNORMAL LOW (ref 32.0–36.0)
MCV: 89 fL (ref 80–100)
Platelet: 234 10*3/uL (ref 150–440)
RBC: 5.21 10*6/uL — ABNORMAL HIGH (ref 3.80–5.20)
RDW: 13.2 % (ref 11.5–14.5)
WBC: 10.9 10*3/uL (ref 3.6–11.0)

## 2014-03-12 LAB — URINALYSIS, COMPLETE
Bilirubin,UR: NEGATIVE
Blood: NEGATIVE
GLUCOSE, UR: NEGATIVE mg/dL (ref 0–75)
Hyaline Cast: 16
Ketone: NEGATIVE
Nitrite: NEGATIVE
Ph: 5 (ref 4.5–8.0)
Protein: NEGATIVE
RBC,UR: 7 /HPF (ref 0–5)
SPECIFIC GRAVITY: 1.024 (ref 1.003–1.030)

## 2014-03-12 LAB — MAGNESIUM: Magnesium: 2 mg/dL

## 2014-03-12 LAB — LIPASE, BLOOD: LIPASE: 107 U/L (ref 73–393)

## 2014-04-16 ENCOUNTER — Encounter: Payer: Self-pay | Admitting: *Deleted

## 2014-04-16 ENCOUNTER — Encounter: Payer: Self-pay | Admitting: Sports Medicine

## 2014-05-29 ENCOUNTER — Other Ambulatory Visit: Payer: Self-pay | Admitting: Neurology

## 2014-06-01 NOTE — Op Note (Signed)
PATIENT NAME:  Heather Haley, Heather Haley MR#:  [id number] DATE OF BIRTH:  17-Jan-1961  DATE OF PROCEDURE:  12/25/2013  PREOPERATIVE DIAGNOSIS: Chronic appendicitis.   POSTOPERATIVE DIAGNOSIS: Chronic appendicitis.   PROCEDURE: Laparoscopic appendectomy.   SURGEON: Loreli Dollar, M.D.   ANESTHESIA: General.   INDICATIONS: This 54 year old female has a 77-month history of intermittent right lower quadrant abdominal pains. She had recent CT findings of some enlargement of the appendix suggestive of chronic appendicitis, and surgery was recommended for definitive treatment.   DESCRIPTION OF PROCEDURE: The patient was placed on the operating table in the supine position under general endotracheal anesthesia. There was a large lower abdominal pannus. The abdomen was prepared with ChloraPrep and draped in a sterile manner.   A short incision was made in the inferior aspect of the umbilicus and carried down to the deep fascia, which was grasped with a laryngeal hook and elevated. A Veress needle was inserted, aspirated and irrigated with a saline solution. Next, the peritoneal cavity was inflated with carbon dioxide. The Veress needle was removed. The 10 mm cannula was inserted. The 10 mm, 0-degree laparoscope was inserted to view the peritoneal cavity. The liver appeared normal. The visible small bowel appeared typical. There was a large amount of omentum. The cecum was not yet in view. Next, another incision was made in the left lower quadrant lateral to the inferior epigastric vessels to insert a 12 mm port. Next, another incision was made in the right lower quadrant lateral to the inferior epigastric vessels to insert a 5 mm port.   The patient was placed in Trendelenburg position and turned several degrees to the left. The cecum was identified and lifted up with a Babcock clamp and identified. The appendix, which appeared to be moderately dilated was not gangrenous and not ruptured. The small bit of the  terminal ileum seen appeared typical. Next, a window was created in the mesoappendix large enough to admit the Endo GIA stapling instrument. The cecum itself appeared normal. The blue cartridge Endo GIA stapler was placed across the appendix adjacent to the cecum, engaged and activated, held for 30 seconds and disengaged. The staple line appeared to be hemostatic. Next, the white cartridge Endo GIA stapler was placed across the mesoappendix, engaged and activated, held for 30 seconds, and disengaged and removed. There was some bleeding at the staple line, which was cauterized. Next, the appendix was placed into an Endo Catch bag and retrieved through the 12 mm port site and submitted in formalin for routine pathology. The operative site was further inspected as a 12 mm port was reinserted. A small amount of clotted blood was aspirated. The staple line of the division of the mesoappendix was further inspected and 2 small points were further cauterized. Hemostasis subsequently appeared to be intact. The staple line on the cecum appeared to be typical. The cannulas were removed, seeing no significant bleeding from the cannula sites, and air was allowed to escape from the peritoneal cavity.   The skin incisions were closed with interrupted 5-0 chromic subcuticular suture, benzoin, and Steri-Strips. Dressings were applied with paper tape. The patient tolerated surgery satisfactorily and was then prepared for transfer to the recovery room.   ____________________________ Lenna Sciara. Rochel Brome, MD jws:JT D: 12/25/2013 11:27:10 ET T: 12/25/2013 11:48:28 ET JOB#: [id number]  cc: Loreli Dollar, MD, <Dictator> Loreli Dollar MD ELECTRONICALLY SIGNED 12/26/2013 9:40

## 2014-06-01 NOTE — Consult Note (Signed)
NSAIDS: Chest Pain  Lyrica: Alt Ment Status  Cymbalta: Alt Ment Status  Nortriptyline: Alt Ment Status  Codeine: Hives  Penicillin: GI Distress   Impression 1. asymptomatic bradycaria increase with ambulation 2. low BP hx HTN 3. DM with neuropathy 4.   Plan 1. hold propanolol for now for HR and BP 2. f/u cardiology [place] 3. f.u PCP at [facility] in [place] 4. cont other meds  ok to go home  EKG unchanged from previous  thanks please cc copy of your d/c summary to cardiology adn PCP thanks   Electronic Signatures for Addendum Section:  Bettey Costa (MD) (Signed Addendum [phone] 12:31)  [phone]   Electronic Signatures: Bettey Costa (MD)  (Signed [phone] 10:45)  Authored: Home Medications, Allergies, Impression/Plan   Last Updated: 18-Nov-15 12:31 by Bettey Costa (MD)

## 2014-06-01 NOTE — Consult Note (Signed)
PATIENT NAME:  Heather Haley, Heather Haley MR#:  [id number] DATE OF BIRTH:  09/16/1960  DATE OF CONSULTATION:  12/26/2013  REFERRING PHYSICIAN: Loreli Dollar, MD.   IMPRESSION: 1.  Bradycardia, possibly due to propranolol.  2.  Low blood pressure possibly due to propranolol.  3.  Postoperative day #1 for laparoscopic appendectomy.  4.  History of diabetes with neuropathy.   PLAN: 1.  Hold propanolol now for heart rate and blood pressure.  Can follow up with her cardiologist in [place] as an outpatient.  2.  EKG shows no acute changes.  3.  Follow up PCP at [facility] in [place]. 4.  Continue other outpatient medications.   HISTORY OF PRESENT ILLNESS: This is a 54 year old female with a history of diabetes with neuropathy, who presented for chronic appendicitis and a laparoscopic appendectomy.  Hospitalist was consulted for low heart rate.  The patient's heart rates are anywhere 48 to 58. The patient is asymptomatic from her low heart rate.  When she ambulates, her heart rate does increase.   REVIEW OF SYSTEMS:  CONSTITUTIONAL: No fever, chills, fatigue, or weakness.  EYES:  No blurred or double vision.  EARS AND NOSE AND THROAT: No swelling, hearing loss, or snoring.  RESPIRATORY: No cough, wheezing, hemoptysis, COPD.  CARDIOVASCULAR: No chest pain, orthopnea, edema, arrhythmia, dyspnea on exertion palpitations, or syncope.  GASTROINTESTINAL: No nausea, vomiting, diarrhea, abdominal pain, melena, or ulcers.  GENITOURINARY: No dysuria or hematuria. ENDOCRINE:  No polyuria, polydipsia.  HEME/ONC No anemia or easy bleeding. SKIN: No rash or lesions.  MUSCULOSKELETAL: Semi-limited activity. NEUROLOGIC:  No history of CVA. PSYCHIATRIC:  No history of bipolar affective disorder.  She does have anxiety.   PAST MEDICAL HISTORY: 1.  Hypertension, essential. 2.  Anxiety. 3.  Depression.  4.  Type 2 diabetes.  5.  Hyperlipidemia.  6.  Occasional tremors.  7.  Migraines.   SOCIAL  HISTORY:  No tobacco, alcohol or IV drug use.   FAMILY HISTORY: Positive for hypertension.   ALLERGIES:  NSAIDS, LYRICA, CYMBALTA, NORTRIPTYLINE, PENICILLIN, CODEINE.   PAST SURGICAL HISTORY:  1.  The patient is status post appendectomy.  2.  Ankle surgery.  3.  Knee surgery. 4.  Replacement total knee. 5.  Abdominal hysterectomy.  6.  Postoperative day #1 for appendectomy.    PHYSICAL EXAMINATION: VITAL SIGNS: Temperature 97.5, pulse of 58.  Of note, when she ambulates, her heart rate goes up to 64 to 68, respirations 18, blood pressure 107/68, 98% on room air. GENERAL:  The patient is alert and oriented, not in acute distress.  HEENT: Head is atraumatic. Pupils are round and reactive. Sclerae anicteric. Mucous membranes are moist.  NECK: Supple. No JVD.  CARDIOVASCULAR: Bradycardia. No murmurs, gallops or rubs. PMI is not displaced. LUNGS: Clear to auscultation without crackles, rales, rhonchi or wheezing. Normal to percussion.  ABDOMEN:  Bowel sounds are positive. Nontender, nondistended. No hepatosplenomegaly.  EXTREMITIES: No clubbing, cyanosis, or edema.   NEUROLOGIC:  Cranial nerves II through-XII are intact. There are no focal deficits.  SKIN: Without rash or lesions.   LABORATORIES: EKG shows Q waves in the anterior leads, which is old.   White blood cells 10.5, hemoglobin 13.5, hematocrit 43, platelets are 245.000. Troponin less than 0.02. Sodium 142, potassium 4.0, chloride 111, bicarbonate 25, BUN 5, creatinine 0.97, glucose is 103.   Thank you for allowing [place] to participate in the care of the patient.  The patient may be discharged with close followup with her cardiologist  and PCP.   TIME SPENT ON THIS CONSULT:  50 minutes.   ____________________________ Heather Beers. Benjie Karvonen, MD spm:DT D: 12/26/2013 12:35:45 ET T: 12/26/2013 13:01:26 ET JOB#: [id number]  cc: Benjamine Strout P. Benjie Karvonen, MD, <Dictator> [employer] [facility] [place]  MD ELECTRONICALLY SIGNED 12/26/2013 15:41

## 2014-06-03 LAB — SURGICAL PATHOLOGY

## 2014-06-06 ENCOUNTER — Encounter (HOSPITAL_COMMUNITY): Payer: Self-pay | Admitting: Emergency Medicine

## 2014-06-06 ENCOUNTER — Emergency Department (HOSPITAL_COMMUNITY)
Admission: EM | Admit: 2014-06-06 | Discharge: 2014-06-06 | Disposition: A | Discharge status: Home / Self Care | Payer: Medicare Other | Attending: Emergency Medicine | Admitting: Emergency Medicine

## 2014-06-06 DIAGNOSIS — Z7951 Long term (current) use of inhaled steroids: Secondary | ICD-10-CM | POA: Diagnosis not present

## 2014-06-06 DIAGNOSIS — Z880 Allergy status to penicillin: Secondary | ICD-10-CM | POA: Insufficient documentation

## 2014-06-06 DIAGNOSIS — E119 Type 2 diabetes mellitus without complications: Secondary | ICD-10-CM | POA: Diagnosis not present

## 2014-06-06 DIAGNOSIS — Z7982 Long term (current) use of aspirin: Secondary | ICD-10-CM | POA: Diagnosis not present

## 2014-06-06 DIAGNOSIS — I10 Essential (primary) hypertension: Secondary | ICD-10-CM | POA: Insufficient documentation

## 2014-06-06 DIAGNOSIS — K219 Gastro-esophageal reflux disease without esophagitis: Secondary | ICD-10-CM | POA: Insufficient documentation

## 2014-06-06 DIAGNOSIS — Z79899 Other long term (current) drug therapy: Secondary | ICD-10-CM | POA: Diagnosis not present

## 2014-06-06 DIAGNOSIS — Z87891 Personal history of nicotine dependence: Secondary | ICD-10-CM | POA: Insufficient documentation

## 2014-06-06 DIAGNOSIS — F329 Major depressive disorder, single episode, unspecified: Secondary | ICD-10-CM | POA: Insufficient documentation

## 2014-06-06 DIAGNOSIS — M545 Low back pain, unspecified: Secondary | ICD-10-CM

## 2014-06-06 NOTE — ED Provider Notes (Signed)
CSN: [id number]     Arrival date & time 06/06/14  1222 History  This chart was scribed for [name] PA-C working with [name], MD by [name], ED Scribe. This patient was seen in room TR03C/TR03C and the patient's care was started at 2:05 PM.   Chief Complaint  Patient presents with  . Back Pain   The history is provided by the patient. No language interpreter was used.   HPI Comments: Heather Haley is a 54 y.o. female who presents to the Emergency Department complaining of right lower back ongoing for two weeks, which has become progressively more severe over the last week. Patient shares history of left ankle surgery in 2008. Patient states that when she experiences pain in left ankle she tends to favor her right leg, preferring not to bear weight on the left ankle.   Patient takes Tramadol and Vicodin for her chronic pain; she is a client at a pain management clinic and states that she is not here for pain relief/medication, but wants to know why she is experiencing such symptoms in her right hip and back and what she describes as "shortening of her right leg." Patient reports that her Vicodin does mildly relieve her pain, but is unable to achieve complete relief. Patient's orthopedic surgeon, Dr. [name]. Patient reports previous imaging of back. Patient denies bladder/bowel incontinence, fever, chills, dysuria, numbness or tingling in her legs.  No history of Cancer or IVDU.   Past Medical History  Diagnosis Date  . Hypertension   . IBS (irritable bowel syndrome)   . Postoperative nausea and vomiting   . Diabetes mellitus   . Left ankle instability   . Headache(784.0)   . Left knee DJD   . Depression   . Frequency of urination   . GERD (gastroesophageal reflux disease)    Past Surgical History  Procedure Laterality Date  . Ankle surgery    . Abdominal hysterectomy    . Knee arthroscopy  1997    left knee  . Total knee arthroplasty  08/30/2011    Procedure:  TOTAL KNEE ARTHROPLASTY;  Surgeon: Lorn Junes, MD;  Location: [place];  Service: Orthopedics;  Laterality: Left;  . Colonoscopy  2013  . Appendectomy    . Left heart catheterization with coronary angiogram N/A 01/11/2013    Procedure: LEFT HEART CATHETERIZATION WITH CORONARY ANGIOGRAM;  Surgeon: Sinclair Grooms, MD;  Location[facility];  Service: Cardiovascular;  Laterality: N/A;   Family History  Problem Relation Age of Onset  . Cancer Mother   . Hypertension Father   . Heart disease Father    History  Substance Use Topics  . Smoking status: Former Smoker -- 2.00 packs/day for 15 years    Types: Cigarettes    Quit date: 02/09/1995  . Smokeless tobacco: Never Used  . Alcohol Use: No   OB History    No data available     Review of Systems  Constitutional: Negative for fever.  Genitourinary: Negative for dysuria.  Musculoskeletal: Positive for back pain.  Skin: Negative for color change.  Neurological: Negative for weakness and numbness.      Allergies  Codeine; Cymbalta; Lyrica; Nortripytline hcl; Nsaids; and Penicillins  Home Medications   Prior to Admission medications   Medication Sig Start Date End Date Taking? Authorizing Provider  aspirin EC 81 MG tablet Take 81 mg by mouth daily. 01/08/13   Belva Crome, MD  buPROPion (WELLBUTRIN XL) 150 MG 24  hr tablet Take 150 mg by mouth daily.    Historical Provider, MD  cetirizine (ZYRTEC) 10 MG tablet Take 1 tablet (10 mg total) by mouth at bedtime.    Susy Frizzle, MD  Dexlansoprazole (DEXILANT) 30 MG capsule Take 30 mg by mouth daily.    Historical Provider, MD  fluticasone (FLONASE) 50 MCG/ACT nasal spray Place 2 sprays into both nostrils daily.    Historical Provider, MD  glipiZIDE (GLUCOTROL XL) 5 MG 24 hr tablet Take 5 mg by mouth at bedtime.    Historical Provider, MD  HYDROcodone-acetaminophen (NORCO/VICODIN) 5-325 MG per tablet Take 1 tablet by mouth daily as needed. 05/03/13   Susy Frizzle, MD   Hypromellose (ARTIFICIAL TEARS OP) Apply 2 drops to eye daily as needed (for dry eyes). Dry eye    Historical Provider, MD  metFORMIN (GLUCOPHAGE) 1000 MG tablet Take 1,000 mg by mouth 2 (two) times daily with a meal.    Historical Provider, MD  methylnaltrexone (RELISTOR) 12 MG/0.6ML SOLN injection Inject into the skin every other day.    Historical Provider, MD  Polyethylene Glycol 3350 (MIRALAX PO) Take by mouth.    Historical Provider, MD  pravastatin (PRAVACHOL) 80 MG tablet Take 80 mg by mouth at bedtime.    Historical Provider, MD  propranolol ER (INDERAL LA) 80 MG 24 hr capsule Take 80 mg by mouth every morning. 12/26/12   Susy Frizzle, MD  rizatriptan (MAXALT-MLT) 5 MG disintegrating tablet Take 1 tablet (5 mg total) by mouth as needed for migraine. May repeat in 2 hours if needed 06/26/13   Marcial Pacas, MD  SUMAtriptan (IMITREX) 100 MG tablet Take 1 tab at onset of headache, may take second dose after 1 hour. Do not take more than 2 a week 05/18/13   Cameron Sprang, MD  topiramate (TOPAMAX) 100 MG tablet Take 1-1/2 tablet twice a day 05/18/13   Cameron Sprang, MD  traMADol (ULTRAM) 50 MG tablet TAKE 1 TABLET BY MOUTH THREE TIMES DAILY AS NEEDED FOR PAIN. 10/19/13   Stefanie Libel, MD  traMADol Veatrice Bourbon) 50 MG tablet Take 2 tabs three times a day as needed for pain 02/18/14   Stefanie Libel, MD  venlafaxine XR (EFFEXOR-XR) 150 MG 24 hr capsule Take 150 mg by mouth daily with breakfast.    Historical Provider, MD   Triage Vitals: BP 120/71 mmHg  Pulse 83  Temp(Src) 98.1 F (36.7 C) (Oral)  Resp 18  Ht 5\' 5"  (1.651 m)  Wt 200 lb (90.719 kg)  BMI 33.28 kg/m2  SpO2 97% Physical Exam  Constitutional: She is oriented to person, place, and time. She appears well-developed and well-nourished. No distress.  HENT:  Head: Normocephalic and atraumatic.  Eyes: EOM are normal.  Neck: Neck supple. No tracheal deviation present.  Cardiovascular: Normal rate, regular rhythm and normal heart sounds.    Pulmonary/Chest: Effort normal and breath sounds normal. No respiratory distress.  Musculoskeletal: Normal range of motion.  Tenderness to palpation of lumbar spine. No step offs, no deformity, no overlying erythema. Mild tenderness to palpation of right hip, no erythema, edema or warmth. 2+ PT reflexes bilaterally. 2+ DP pulses. Distal sensation of both feet intact. Full ROM of right hip.   Neurological: She is alert and oriented to person, place, and time. She has normal strength.  Reflex Scores:      Patellar reflexes are 2+ on the right side and 2+ on the left side. Normal gait.  Muscle strength 5/5 of  lower extremities bilaterally Distal sensation of both feet intact.  Skin: Skin is warm and dry.  Psychiatric: She has a normal mood and affect. Her behavior is normal.  Nursing note and vitals reviewed.   ED Course  Procedures (including critical care time)  COORDINATION OF CARE: 2:25 PM- Discussed treatment plan with patient at bedside and patient agreed to plan.   Labs Review Labs Reviewed - No data to display  Imaging Review No results found.   EKG Interpretation None      MDM   Final diagnoses:  None   Patient with back pain.  No neurological deficits and normal neuro exam.  Patient can walk but states is painful.  No loss of bowel or bladder control.  No concern for cauda equina.  No fever, night sweats, weight loss, h/o cancer, IVDU.  Patient not given pain medication due to the fact that she is followed by the Pain Clinic.  Stable for discharge.  Return precautions given.    [name], PA-C 06/06/14 [address] PA-C 06/06/14 1557  [name], MD 06/06/14 [phone]

## 2014-06-06 NOTE — ED Notes (Signed)
Pt c/o lower back pain after feeling "pop" per pt; pt sts hx of ankle sx and now has brace on left ankle

## 2014-06-06 NOTE — Discharge Instructions (Signed)
Your lower back pain may be a result of compensating your weight due to the pain in your left ankle.

## 2014-07-10 ENCOUNTER — Other Ambulatory Visit: Payer: Self-pay | Admitting: Pain Medicine

## 2014-07-10 DIAGNOSIS — M545 Low back pain: Secondary | ICD-10-CM

## 2014-07-16 ENCOUNTER — Ambulatory Visit
Admission: RE | Admit: 2014-07-16 | Discharge: 2014-07-16 | Disposition: A | Discharge status: Home / Self Care | Payer: Medicare Other | Source: Ambulatory Visit | Attending: Pain Medicine | Admitting: Pain Medicine

## 2014-07-16 DIAGNOSIS — Z7982 Long term (current) use of aspirin: Secondary | ICD-10-CM | POA: Diagnosis not present

## 2014-07-16 DIAGNOSIS — M545 Low back pain: Secondary | ICD-10-CM

## 2014-07-16 DIAGNOSIS — E780 Pure hypercholesterolemia: Secondary | ICD-10-CM | POA: Diagnosis not present

## 2014-07-16 DIAGNOSIS — F418 Other specified anxiety disorders: Secondary | ICD-10-CM | POA: Diagnosis not present

## 2014-07-16 DIAGNOSIS — I10 Essential (primary) hypertension: Secondary | ICD-10-CM | POA: Diagnosis not present

## 2014-07-16 DIAGNOSIS — K219 Gastro-esophageal reflux disease without esophagitis: Secondary | ICD-10-CM | POA: Diagnosis not present

## 2014-07-16 DIAGNOSIS — M5136 Other intervertebral disc degeneration, lumbar region: Secondary | ICD-10-CM | POA: Insufficient documentation

## 2014-10-02 ENCOUNTER — Encounter: Payer: Self-pay | Admitting: *Deleted

## 2014-10-03 ENCOUNTER — Ambulatory Visit
Admission: RE | Admit: 2014-10-03 | Discharge: 2014-10-03 | Disposition: A | Discharge status: Home / Self Care | Payer: Medicare Other | Source: Ambulatory Visit | Attending: Gastroenterology | Admitting: Gastroenterology

## 2014-10-03 ENCOUNTER — Encounter
Admission: RE | Disposition: A | Discharge status: Home / Self Care | Payer: Self-pay | Source: Ambulatory Visit | Attending: Gastroenterology

## 2014-10-03 ENCOUNTER — Ambulatory Visit: Payer: Medicare Other | Admitting: Anesthesiology

## 2014-10-03 ENCOUNTER — Encounter: Payer: Self-pay | Admitting: Anesthesiology

## 2014-10-03 DIAGNOSIS — I10 Essential (primary) hypertension: Secondary | ICD-10-CM | POA: Insufficient documentation

## 2014-10-03 DIAGNOSIS — F419 Anxiety disorder, unspecified: Secondary | ICD-10-CM | POA: Insufficient documentation

## 2014-10-03 DIAGNOSIS — E780 Pure hypercholesterolemia: Secondary | ICD-10-CM | POA: Insufficient documentation

## 2014-10-03 DIAGNOSIS — R350 Frequency of micturition: Secondary | ICD-10-CM | POA: Insufficient documentation

## 2014-10-03 DIAGNOSIS — R1013 Epigastric pain: Secondary | ICD-10-CM | POA: Insufficient documentation

## 2014-10-03 DIAGNOSIS — E119 Type 2 diabetes mellitus without complications: Secondary | ICD-10-CM | POA: Insufficient documentation

## 2014-10-03 DIAGNOSIS — Z885 Allergy status to narcotic agent status: Secondary | ICD-10-CM | POA: Insufficient documentation

## 2014-10-03 DIAGNOSIS — F329 Major depressive disorder, single episode, unspecified: Secondary | ICD-10-CM | POA: Diagnosis not present

## 2014-10-03 DIAGNOSIS — Z87891 Personal history of nicotine dependence: Secondary | ICD-10-CM | POA: Insufficient documentation

## 2014-10-03 DIAGNOSIS — K2970 Gastritis, unspecified, without bleeding: Secondary | ICD-10-CM | POA: Diagnosis not present

## 2014-10-03 DIAGNOSIS — Z96652 Presence of left artificial knee joint: Secondary | ICD-10-CM | POA: Diagnosis not present

## 2014-10-03 DIAGNOSIS — Z8249 Family history of ischemic heart disease and other diseases of the circulatory system: Secondary | ICD-10-CM | POA: Insufficient documentation

## 2014-10-03 DIAGNOSIS — R1310 Dysphagia, unspecified: Secondary | ICD-10-CM | POA: Insufficient documentation

## 2014-10-03 DIAGNOSIS — M25372 Other instability, left ankle: Secondary | ICD-10-CM | POA: Diagnosis not present

## 2014-10-03 DIAGNOSIS — Z79899 Other long term (current) drug therapy: Secondary | ICD-10-CM | POA: Diagnosis not present

## 2014-10-03 DIAGNOSIS — Z7982 Long term (current) use of aspirin: Secondary | ICD-10-CM | POA: Diagnosis not present

## 2014-10-03 DIAGNOSIS — K589 Irritable bowel syndrome without diarrhea: Secondary | ICD-10-CM | POA: Diagnosis not present

## 2014-10-03 DIAGNOSIS — Z809 Family history of malignant neoplasm, unspecified: Secondary | ICD-10-CM | POA: Insufficient documentation

## 2014-10-03 DIAGNOSIS — Z90710 Acquired absence of both cervix and uterus: Secondary | ICD-10-CM | POA: Diagnosis not present

## 2014-10-03 DIAGNOSIS — Z880 Allergy status to penicillin: Secondary | ICD-10-CM | POA: Diagnosis not present

## 2014-10-03 DIAGNOSIS — K219 Gastro-esophageal reflux disease without esophagitis: Secondary | ICD-10-CM | POA: Insufficient documentation

## 2014-10-03 HISTORY — DX: Myoneural disorder, unspecified: G70.9

## 2014-10-03 HISTORY — DX: Pure hypercholesterolemia, unspecified: E78.00

## 2014-10-03 HISTORY — DX: Anxiety disorder, unspecified: F41.9

## 2014-10-03 HISTORY — PX: ESOPHAGOGASTRODUODENOSCOPY (EGD) WITH PROPOFOL: SHX5813

## 2014-10-03 LAB — GLUCOSE, CAPILLARY: GLUCOSE-CAPILLARY: 110 mg/dL — AB (ref 65–99)

## 2014-10-03 SURGERY — ESOPHAGOGASTRODUODENOSCOPY (EGD) WITH PROPOFOL
Anesthesia: General

## 2014-10-03 MED ORDER — PROPOFOL 10 MG/ML IV BOLUS
INTRAVENOUS | Status: DC | PRN
  Administered 2014-10-03: 70 mg via INTRAVENOUS
  Administered 2014-10-03: 30 mg via INTRAVENOUS

## 2014-10-03 MED ORDER — GLYCOPYRROLATE 0.2 MG/ML IJ SOLN
INTRAMUSCULAR | Status: DC | PRN
Start: 2014-10-03 — End: 2014-10-03
  Administered 2014-10-03: 0.3 mg via INTRAVENOUS

## 2014-10-03 MED ORDER — PROPOFOL INFUSION 10 MG/ML OPTIME
INTRAVENOUS | Status: DC | PRN
  Administered 2014-10-03: 120 ug/kg/min via INTRAVENOUS

## 2014-10-03 MED ORDER — SODIUM CHLORIDE 0.9 % IV SOLN
INTRAVENOUS | Status: DC
  Administered 2014-10-03: 1000 mL via INTRAVENOUS

## 2014-10-03 MED ORDER — SODIUM CHLORIDE 0.9 % IV SOLN: INTRAVENOUS | Status: DC

## 2014-10-03 MED ORDER — LIDOCAINE HCL (CARDIAC) 20 MG/ML IV SOLN
INTRAVENOUS | Status: DC | PRN
  Administered 2014-10-03: 100 mg via INTRAVENOUS

## 2014-10-03 NOTE — Anesthesia Preprocedure Evaluation (Signed)
Anesthesia Evaluation  Patient identified by MRN, date of birth, ID band Patient awake    Reviewed: Allergy & Precautions, NPO status , Patient's Chart, lab work & pertinent test results, reviewed documented beta blocker date and time   History of Anesthesia Complications (+) PONV and history of anesthetic complications  Airway Mallampati: II  TM Distance: >3 FB     Dental  (+) Chipped   Pulmonary former smoker,          Cardiovascular hypertension, Pt. on medications and Pt. on home beta blockers + angina     Neuro/Psych  Headaches, PSYCHIATRIC DISORDERS Anxiety Depression  Neuromuscular disease    GI/Hepatic GERD-  ,  Endo/Other  diabetes, Type 2  Renal/GU      Musculoskeletal  (+) Arthritis -,   Abdominal   Peds  Hematology   Anesthesia Other Findings   Reproductive/Obstetrics                             Anesthesia Physical Anesthesia Plan  ASA: III  Anesthesia Plan: General   Post-op Pain Management:    Induction: Intravenous  Airway Management Planned: Nasal Cannula  Additional Equipment:   Intra-op Plan:   Post-operative Plan:   Informed Consent: I have reviewed the patients History and Physical, chart, labs and discussed the procedure including the risks, benefits and alternatives for the proposed anesthesia with the patient or authorized representative who has indicated his/her understanding and acceptance.     Plan Discussed with: CRNA  Anesthesia Plan Comments:         Anesthesia Quick Evaluation

## 2014-10-03 NOTE — Transfer of Care (Signed)
Immediate Anesthesia Transfer of Care Note  Patient: Heather Haley  Procedure(s) Performed: Procedure(s): ESOPHAGOGASTRODUODENOSCOPY (EGD) WITH PROPOFOL (N/A)  Patient Location: PACU  Anesthesia Type:General  Level of Consciousness: awake  Airway & Oxygen Therapy: Patient Spontanous Breathing and Patient connected to nasal cannula oxygen  Post-op Assessment: Report given to RN and Post -op Vital signs reviewed and stable  Post vital signs: Reviewed and stable  Last Vitals:  Filed Vitals:   10/03/14 0922  BP:   Pulse:   Temp: 35.8 C  Resp:     Complications: No apparent anesthesia complications

## 2014-10-03 NOTE — Discharge Instructions (Signed)

## 2014-10-03 NOTE — Op Note (Signed)
[facility] Gastroenterology Patient Name: Heather Haley Procedure Date: 10/03/2014 8:57 AM MRN: [id number] Account #: [id number] Date of Birth: [birth year] Admit Type: Outpatient Age: 54 Room: [facility] ENDO ROOM 1 Gender: Female Note Status: Finalized Procedure:         Upper GI endoscopy Indications:       Epigastric abdominal pain, Abdominal pain in the right                     upper quadrant, Dysphagia, Heartburn, Suspected esophageal                     reflux Patient Profile:   This is a 54 year old female. Providers:         Gerrit Heck. Rayann Heman, MD Referring MD:      Elenora Gamma MD Medicines:         Propofol per Anesthesia Complications:     No immediate complications. Procedure:         Pre-Anesthesia Assessment:                    - Prior to the procedure, a History and Physical was                     performed, and patient medications, allergies and                     sensitivities were reviewed. The patient's tolerance of                     previous anesthesia was reviewed.                    After obtaining informed consent, the endoscope was passed                     under direct vision. Throughout the procedure, the                     patient's blood pressure, pulse, and oxygen saturations                     were monitored continuously. The Endoscope was introduced                     through the mouth, and advanced to the second part of                     duodenum. The upper GI endoscopy was accomplished without                     difficulty. The patient tolerated the procedure well. Findings:      The esophagus was normal.      Scattered moderate inflammation characterized by erythema and       granularity was found in the cardia and in the gastric body. Biopsies       were taken with a cold forceps for histology.      Localized mild mucosal abnormality characterized by granularity and       texture change was found in the duodenal bulb. ?  healed ulcer. Biopsies       were taken with a cold forceps for histology. Estimated blood loss: none.      The exam was otherwise without abnormality. Impression:        -  Normal esophagus.                    - Gastritis. Biopsied.                    - Mucosal abnormality in the duodenum. ? healed ulcer.                     Biopsied.                    - The examination was otherwise normal. Recommendation:    - Observe patient in GI recovery unit.                    - Resume regular diet.                    - Continue present medications.                    - Return to GI clinic. Consider alternative PPI, manomery                     and 24 hour pH study although these sympttoms are likely                     functional.                    - The findings and recommendations were discussed with the                     patient.                    - The findings and recommendations were discussed with the                     patient's family. Procedure Code(s): --- Professional ---                    [phone], Esophagogastroduodenoscopy, flexible, transoral;                     with biopsy, single or multiple CPT copyright 2014 American [employer] Association. All rights reserved. The codes documented in this report are preliminary and upon coder review may  be revised to meet current compliance requirements. Mellody Life, MD 10/03/2014 9:22:32 AM This report has been signed electronically. Number of Addenda: 0 Note Initiated On: 10/03/2014 8:57 AM      [facility]

## 2014-10-03 NOTE — H&P (Signed)
Primary Care Physician:  Kelton Pillar, Herschell Dimes, MD  Pre-Procedure History & Physical: HPI:  Heather Haley is a 54 y.o. female is here for an endoscopy.   Past Medical History  Diagnosis Date  . Hypertension   . IBS (irritable bowel syndrome)   . Postoperative nausea and vomiting   . Diabetes mellitus   . Left ankle instability   . Headache(784.0)   . Left knee DJD   . Depression   . Frequency of urination   . GERD (gastroesophageal reflux disease)   . High cholesterol   . Neuromuscular disorder   . Anxiety     Past Surgical History  Procedure Laterality Date  . Ankle surgery    . Abdominal hysterectomy    . Knee arthroscopy  1997    left knee  . Total knee arthroplasty  08/30/2011    Procedure: TOTAL KNEE ARTHROPLASTY;  Surgeon: Lorn Junes, MD;  Location: [place];  Service: Orthopedics;  Laterality: Left;  . Colonoscopy  2013  . Appendectomy    . Left heart catheterization with coronary angiogram N/A 01/11/2013    Procedure: LEFT HEART CATHETERIZATION WITH CORONARY ANGIOGRAM;  Surgeon: Sinclair Grooms, MD;  Location: [facility] CATH LAB;  Service: Cardiovascular;  Laterality: N/A;    Prior to Admission medications   Medication Sig Start Date End Date Taking? Authorizing Provider  aspirin EC 81 MG tablet Take 81 mg by mouth daily. 01/08/13  Yes Belva Crome, MD  bisacodyl (DULCOLAX) 5 MG EC tablet Take 5 mg by mouth daily as needed for moderate constipation.   Yes Historical Provider, MD  buPROPion (WELLBUTRIN XL) 150 MG 24 hr tablet Take 150 mg by mouth daily.   Yes Historical Provider, MD  busPIRone (BUSPAR) 5 MG tablet Take 5 mg by mouth 2 (two) times daily.   Yes Historical Provider, MD  calcium carbonate (TUMS - DOSED IN MG ELEMENTAL CALCIUM) 500 MG chewable tablet Chew 1 tablet by mouth daily.   Yes Historical Provider, MD  cetirizine (ZYRTEC) 10 MG tablet Take 1 tablet (10 mg total) by mouth at bedtime.   Yes Susy Frizzle, MD  cholecalciferol (VITAMIN D)  400 UNITS TABS tablet Take 400 Units by mouth.   Yes Historical Provider, MD  cyproheptadine (PERIACTIN) 4 MG tablet Take 4 mg by mouth 2 (two) times daily.   Yes Historical Provider, MD  Dexlansoprazole (DEXILANT) 30 MG capsule Take 30 mg by mouth daily.   Yes Historical Provider, MD  fluticasone (FLONASE) 50 MCG/ACT nasal spray Place 2 sprays into both nostrils daily.   Yes Historical Provider, MD  glipiZIDE (GLUCOTROL XL) 5 MG 24 hr tablet Take 5 mg by mouth at bedtime.   Yes Historical Provider, MD  HYDROcodone-acetaminophen (NORCO/VICODIN) 5-325 MG per tablet Take 1 tablet by mouth daily as needed. 05/03/13  Yes Susy Frizzle, MD  Hypromellose (ARTIFICIAL TEARS OP) Apply 2 drops to eye daily as needed (for dry eyes). Dry eye   Yes Historical Provider, MD  lubiprostone (AMITIZA) 24 MCG capsule Take 24 mcg by mouth 2 (two) times daily with a meal.   Yes Historical Provider, MD  metFORMIN (GLUCOPHAGE) 1000 MG tablet Take 1,000 mg by mouth 2 (two) times daily with a meal.   Yes Historical Provider, MD  omeprazole (PRILOSEC) 40 MG capsule Take 40 mg by mouth daily.   Yes Historical Provider, MD  Polyethylene Glycol 3350 (MIRALAX PO) Take by mouth.   Yes Historical Provider, MD  pravastatin (PRAVACHOL)  80 MG tablet Take 80 mg by mouth at bedtime.   Yes Historical Provider, MD  propranolol ER (INDERAL LA) 80 MG 24 hr capsule Take 80 mg by mouth every morning. 12/26/12  Yes Susy Frizzle, MD  rizatriptan (MAXALT-MLT) 5 MG disintegrating tablet Take 1 tablet (5 mg total) by mouth as needed for migraine. May repeat in 2 hours if needed 06/26/13  Yes Marcial Pacas, MD  traMADol (ULTRAM) 50 MG tablet TAKE 1 TABLET BY MOUTH THREE TIMES DAILY AS NEEDED FOR PAIN. 10/19/13  Yes Stefanie Libel, MD  traZODone (DESYREL) 50 MG tablet Take 50 mg by mouth at bedtime.   Yes Historical Provider, MD  methylnaltrexone (RELISTOR) 12 MG/0.6ML SOLN injection Inject into the skin every other day.    Historical Provider, MD   SUMAtriptan (IMITREX) 100 MG tablet Take 1 tab at onset of headache, may take second dose after 1 hour. Do not take more than 2 a week 05/18/13   Cameron Sprang, MD  topiramate (TOPAMAX) 100 MG tablet Take 1-1/2 tablet twice a day 05/18/13   Cameron Sprang, MD  traMADol Veatrice Bourbon) 50 MG tablet Take 2 tabs three times a day as needed for pain 02/18/14   Stefanie Libel, MD  venlafaxine XR (EFFEXOR-XR) 150 MG 24 hr capsule Take 150 mg by mouth daily with breakfast.    Historical Provider, MD    Allergies as of 09/20/2014 - Review Complete 06/06/2014  Allergen Reaction Noted  . Codeine  09/04/2009  . Cymbalta [duloxetine hcl]  06/06/2014  . Lyrica [pregabalin]  06/06/2014  . Nortripytline hcl [nortriptyline]  12/12/2012  . Nsaids  09/04/2009  . Penicillins  09/04/2009    Family History  Problem Relation Age of Onset  . Cancer Mother   . Hypertension Father   . Heart disease Father     Social History   Social History  . Marital Status: Divorced    Spouse Name: N/A  . Number of Children: 2  . Years of Education: 11   Occupational History  .  [place] History Main Topics  . Smoking status: Former Smoker -- 2.00 packs/day for 15 years    Types: Cigarettes    Quit date: 02/09/1995  . Smokeless tobacco: Never Used  . Alcohol Use: No  . Drug Use: No  . Sexual Activity: Not Currently   Other Topics Concern  . Not on file   Social History Narrative   Patient lives at home alone. Patient works at [employer].   Caffeine daily- 2   Right handed.   Education- 11 th grade     Physical Exam: BP 153/95 mmHg  Pulse 86  Temp(Src) 97.8 F (36.6 C) (Tympanic)  Resp 16  Ht 5\' 6"  (1.676 m)  Wt 86.183 kg (190 lb)  BMI 30.68 kg/m2  SpO2 96% General:   Alert,  pleasant and cooperative in NAD Head:  Normocephalic and atraumatic. Neck:  Supple; no masses or thyromegaly. Lungs:  Clear throughout to auscultation.    Heart:  Regular rate and rhythm. Abdomen:  Soft, +epi  TTP, and nondistended. Normal bowel sounds, without guarding, and without rebound.   Neurologic:  Alert and  oriented x4;  grossly normal neurologically.  Impression/Plan: Heather Haley is here for an endoscopy to be performed for epi pain, reflux, dysphagia  Risks, benefits, limitations, and alternatives regarding  endoscopy have been reviewed with the patient.  Questions have been answered.  All parties agreeable.   Rhia Blatchford  Araceli Bouche, MD  10/03/2014, 8:55 AM

## 2014-10-04 LAB — SURGICAL PATHOLOGY

## 2014-10-04 NOTE — Anesthesia Postprocedure Evaluation (Signed)
  Anesthesia Post-op Note  Patient: Heather Haley  Procedure(s) Performed: Procedure(s): ESOPHAGOGASTRODUODENOSCOPY (EGD) WITH PROPOFOL (N/A)  Anesthesia type:General  Patient location: PACU  Post pain: Pain level controlled  Post assessment: Post-op Vital signs reviewed, Patient's Cardiovascular Status Stable, Respiratory Function Stable, Patent Airway and No signs of Nausea or vomiting  Post vital signs: Reviewed and stable  Last Vitals:  Filed Vitals:   10/03/14 0950  BP: 136/89  Pulse: 85  Temp:   Resp: 15    Level of consciousness: awake, alert  and patient cooperative  Complications: No apparent anesthesia complications

## 2014-10-09 ENCOUNTER — Emergency Department: Payer: Medicare Other

## 2014-10-09 ENCOUNTER — Encounter: Payer: Self-pay | Admitting: Student

## 2014-10-09 ENCOUNTER — Emergency Department
Admission: EM | Admit: 2014-10-09 | Discharge: 2014-10-09 | Disposition: A | Discharge status: Home / Self Care | Payer: Medicare Other | Attending: Emergency Medicine | Admitting: Emergency Medicine

## 2014-10-09 ENCOUNTER — Other Ambulatory Visit: Payer: Self-pay

## 2014-10-09 DIAGNOSIS — Z880 Allergy status to penicillin: Secondary | ICD-10-CM | POA: Diagnosis not present

## 2014-10-09 DIAGNOSIS — Z7951 Long term (current) use of inhaled steroids: Secondary | ICD-10-CM | POA: Insufficient documentation

## 2014-10-09 DIAGNOSIS — E119 Type 2 diabetes mellitus without complications: Secondary | ICD-10-CM | POA: Insufficient documentation

## 2014-10-09 DIAGNOSIS — R079 Chest pain, unspecified: Secondary | ICD-10-CM | POA: Diagnosis present

## 2014-10-09 DIAGNOSIS — Z87891 Personal history of nicotine dependence: Secondary | ICD-10-CM | POA: Insufficient documentation

## 2014-10-09 DIAGNOSIS — R42 Dizziness and giddiness: Secondary | ICD-10-CM | POA: Insufficient documentation

## 2014-10-09 DIAGNOSIS — Z79899 Other long term (current) drug therapy: Secondary | ICD-10-CM | POA: Insufficient documentation

## 2014-10-09 DIAGNOSIS — Z7982 Long term (current) use of aspirin: Secondary | ICD-10-CM | POA: Diagnosis not present

## 2014-10-09 DIAGNOSIS — I25119 Atherosclerotic heart disease of native coronary artery with unspecified angina pectoris: Secondary | ICD-10-CM | POA: Diagnosis not present

## 2014-10-09 DIAGNOSIS — R0789 Other chest pain: Secondary | ICD-10-CM | POA: Insufficient documentation

## 2014-10-09 DIAGNOSIS — Z9889 Other specified postprocedural states: Secondary | ICD-10-CM | POA: Diagnosis not present

## 2014-10-09 DIAGNOSIS — I10 Essential (primary) hypertension: Secondary | ICD-10-CM | POA: Insufficient documentation

## 2014-10-09 LAB — CBC
HEMATOCRIT: 46.9 % (ref 35.0–47.0)
HEMOGLOBIN: 14.9 g/dL (ref 12.0–16.0)
MCH: 27.9 pg (ref 26.0–34.0)
MCHC: 31.8 g/dL — AB (ref 32.0–36.0)
MCV: 87.7 fL (ref 80.0–100.0)
Platelets: 321 10*3/uL (ref 150–440)
RBC: 5.34 MIL/uL — ABNORMAL HIGH (ref 3.80–5.20)
RDW: 14 % (ref 11.5–14.5)
WBC: 11.6 10*3/uL — ABNORMAL HIGH (ref 3.6–11.0)

## 2014-10-09 LAB — FIBRIN DERIVATIVES D-DIMER (ARMC ONLY): FIBRIN DERIVATIVES D-DIMER (ARMC): 663 — AB (ref 0–499)

## 2014-10-09 LAB — TROPONIN I: Troponin I: <0.03 ng/mL (ref <0.031)

## 2014-10-09 LAB — BASIC METABOLIC PANEL
ANION GAP: 10 (ref 5–15)
BUN: 12 mg/dL (ref 6–20)
CHLORIDE: 111 mmol/L (ref 101–111)
CO2: 19 mmol/L — ABNORMAL LOW (ref 22–32)
Calcium: 9.4 mg/dL (ref 8.9–10.3)
Creatinine, Ser: 0.96 mg/dL (ref 0.44–1.00)
GFR calc Af Amer: >60 mL/min (ref >60)
GLUCOSE: 118 mg/dL — AB (ref 65–99)
POTASSIUM: 4.1 mmol/L (ref 3.5–5.1)
Sodium: 140 mmol/L (ref 135–145)

## 2014-10-09 MED ORDER — ASPIRIN 81 MG PO CHEW
243.0000 mg | CHEWABLE_TABLET | Freq: Once | ORAL | Status: AC
  Administered 2014-10-09: 243 mg via ORAL
  Filled 2014-10-09: qty 3

## 2014-10-09 MED ORDER — IOHEXOL 350 MG/ML SOLN
80.0000 mL | Freq: Once | INTRAVENOUS | Status: AC | PRN
  Administered 2014-10-09: 80 mL via INTRAVENOUS

## 2014-10-09 MED ORDER — TRAMADOL HCL 50 MG PO TABS
100.0000 mg | ORAL_TABLET | Freq: Once | ORAL | Status: AC
  Administered 2014-10-09: 100 mg via ORAL
  Filled 2014-10-09: qty 2

## 2014-10-09 NOTE — ED Provider Notes (Signed)
[facility] Emergency Department Provider Note  ____________________________________________  Time seen: Approximately 6:10 PM  I have reviewed the triage vital signs and the nursing notes.   HISTORY  Chief Complaint Chest Pain; Dizziness; and Shortness of Breath    HPI Heather Haley is a 54 y.o. female with a past medical historythat includes coronary artery disease/chest pain, unspecified neuromuscular disorder, anxiety, diabetes, hypertension who presents with intermittent sided chest pain that she states started about 3 days ago.  Reportedly it was very brief (lasting seconds) and sharp in the middle of her chest which she attributed to acid reflux.  Starting today it has been recurrent, lasting longer, as she describes it as sharp stabbing chest pain during in the left side of her chest that radiates through to her back as well as up into her neck.  She states that this is much worse than it has happened in the past.  She notes that it extended into her mouth on the left side of her tongue.  She does not know if she received a stent during the catheterization 2 years ago by Dr. Tamala Julian in [place].  She takes a daily baby aspirin but no other blood thinner.  She has not followed up with Dr. Tamala Julian in "a long time".  She also notes that she had a blood clot in her leg several years ago.  Additionally, she is very concerned that the symptoms including headache and the pain she is feeling the left side of her face may be from a stroke.  However she has no complaints of weakness or persistent numbness.   Past Medical History  Diagnosis Date  . Hypertension   . IBS (irritable bowel syndrome)   . Postoperative nausea and vomiting   . Diabetes mellitus   . Left ankle instability   . Headache(784.0)   . Left knee DJD   . Depression   . Frequency of urination   . GERD (gastroesophageal reflux disease)   . High cholesterol   . Neuromuscular disorder   .  Anxiety     Patient Active Problem List   Diagnosis Date Noted  . Other and unspecified angina pectoris 01/11/2013    Class: Acute  . Chest pressure 12/08/2012    Class: Chronic  . Leg length inequality 09/12/2012  . Acute bronchitis 08/01/2012  . Headache(784.0) 05/26/2012  . Morbid obesity 05/26/2012  . Hypersomnia 05/26/2012  . Hypertension   . IBS (irritable bowel syndrome)   . Postoperative nausea and vomiting   . Left ankle instability   . Left knee DJD   . Depression   . Left knee pain 09/18/2010  . OTHER ENTHESOPATHY OF ANKLE AND TARSUS 12/15/2009  . ADVERSE DRUG REACTION 12/15/2009  . ANKLE PAIN, LEFT 09/04/2009  . FOOT PAIN, LEFT 09/04/2009    Past Surgical History  Procedure Laterality Date  . Ankle surgery    . Abdominal hysterectomy    . Knee arthroscopy  1997    left knee  . Total knee arthroplasty  08/30/2011    Procedure: TOTAL KNEE ARTHROPLASTY;  Surgeon: Lorn Junes, MD;  Location: [place];  Service: Orthopedics;  Laterality: Left;  . Colonoscopy  2013  . Appendectomy    . Left heart catheterization with coronary angiogram N/A 01/11/2013    Procedure: LEFT HEART CATHETERIZATION WITH CORONARY ANGIOGRAM;  Surgeon: Sinclair Grooms, MD;  Location: [facility] CATH LAB;  Service: Cardiovascular;  Laterality: N/A;  . Esophagogastroduodenoscopy (egd) with propofol N/A  10/03/2014    Procedure: ESOPHAGOGASTRODUODENOSCOPY (EGD) WITH PROPOFOL;  Surgeon: Josefine Class, MD;  Location: [facility] ENDOSCOPY;  Service: Endoscopy;  Laterality: N/A;    Current Outpatient Rx  Name  Route  Sig  Dispense  Refill  . aspirin EC 81 MG tablet   Oral   Take 81 mg by mouth daily.         . bisacodyl (DULCOLAX) 5 MG EC tablet   Oral   Take 5 mg by mouth daily as needed for moderate constipation.         Marland Kitchen buPROPion (WELLBUTRIN XL) 150 MG 24 hr tablet   Oral   Take 150 mg by mouth daily.         . busPIRone (BUSPAR) 5 MG tablet   Oral   Take 5 mg by mouth 2 (two) times  daily.         . calcium carbonate (TUMS - DOSED IN MG ELEMENTAL CALCIUM) 500 MG chewable tablet   Oral   Chew 1 tablet by mouth daily.         . cetirizine (ZYRTEC) 10 MG tablet   Oral   Take 1 tablet (10 mg total) by mouth at bedtime.   30 tablet   11   . cholecalciferol (VITAMIN D) 400 UNITS TABS tablet   Oral   Take 400 Units by mouth.         . cyproheptadine (PERIACTIN) 4 MG tablet   Oral   Take 4 mg by mouth 2 (two) times daily.         Marland Kitchen Dexlansoprazole (DEXILANT) 30 MG capsule   Oral   Take 30 mg by mouth daily.         . fluticasone (FLONASE) 50 MCG/ACT nasal spray   Each Nare   Place 2 sprays into both nostrils daily.         Marland Kitchen glipiZIDE (GLUCOTROL XL) 5 MG 24 hr tablet   Oral   Take 5 mg by mouth at bedtime.         Marland Kitchen HYDROcodone-acetaminophen (NORCO/VICODIN) 5-325 MG per tablet   Oral   Take 1 tablet by mouth daily as needed.   30 tablet   0   . Hypromellose (ARTIFICIAL TEARS OP)   Ophthalmic   Apply 2 drops to eye daily as needed (for dry eyes). Dry eye         . lubiprostone (AMITIZA) 24 MCG capsule   Oral   Take 24 mcg by mouth 2 (two) times daily with a meal.         . metFORMIN (GLUCOPHAGE) 1000 MG tablet   Oral   Take 1,000 mg by mouth 2 (two) times daily with a meal.         . methylnaltrexone (RELISTOR) 12 MG/0.6ML SOLN injection   Subcutaneous   Inject into the skin every other day.         Marland Kitchen omeprazole (PRILOSEC) 40 MG capsule   Oral   Take 40 mg by mouth daily.         . Polyethylene Glycol 3350 (MIRALAX PO)   Oral   Take by mouth.         . pravastatin (PRAVACHOL) 80 MG tablet   Oral   Take 80 mg by mouth at bedtime.         . propranolol ER (INDERAL LA) 80 MG 24 hr capsule   Oral   Take 80 mg by mouth every morning.         Marland Kitchen  rizatriptan (MAXALT-MLT) 5 MG disintegrating tablet   Oral   Take 1 tablet (5 mg total) by mouth as needed for migraine. May repeat in 2 hours if needed   15 tablet    12   . SUMAtriptan (IMITREX) 100 MG tablet      Take 1 tab at onset of headache, may take second dose after 1 hour. Do not take more than 2 a week   10 tablet   6   . topiramate (TOPAMAX) 100 MG tablet      Take 1-1/2 tablet twice a day   90 tablet   6   . traMADol (ULTRAM) 50 MG tablet      TAKE 1 TABLET BY MOUTH THREE TIMES DAILY AS NEEDED FOR PAIN.   90 tablet   1     Refills must be 30 days apart ([facility])   . traMADol (ULTRAM) 50 MG tablet      Take 2 tabs three times a day as needed for pain   180 tablet   0   . traZODone (DESYREL) 50 MG tablet   Oral   Take 50 mg by mouth at bedtime.         Marland Kitchen venlafaxine XR (EFFEXOR-XR) 150 MG 24 hr capsule   Oral   Take 150 mg by mouth daily with breakfast.           Allergies Codeine; Cymbalta; Lyrica; Morphine and related; Nortripytline hcl; Nsaids; and Penicillins  Family History  Problem Relation Age of Onset  . Cancer Mother   . Hypertension Father   . Heart disease Father     Social History Social History  Substance Use Topics  . Smoking status: Former Smoker -- 2.00 packs/day for 15 years    Types: Cigarettes    Quit date: 02/09/1995  . Smokeless tobacco: Never Used  . Alcohol Use: No    Review of Systems Constitutional: No fever/chills Eyes: No visual changes. ENT: No sore throat. Cardiovascular: Chest pain as described above Respiratory: Occasional intermittent mild shortness of breath Gastrointestinal: No abdominal pain.  Occasional nausea, no vomiting.  No diarrhea.  No constipation. Genitourinary: Negative for dysuria. Musculoskeletal: Negative for back pain. Skin: Negative for rash. Neurological: Negative for headaches, focal weakness or numbness.  10-point ROS otherwise negative.  ____________________________________________   PHYSICAL EXAM:  VITAL SIGNS: ED Triage Vitals  Enc Vitals Group     BP 10/09/14 1602 132/94 mmHg     Pulse Rate 10/09/14 1602 124     Resp  10/09/14 1602 18     Temp 10/09/14 1602 97.6 F (36.4 C)     Temp Source 10/09/14 1602 Oral     SpO2 10/09/14 1602 95 %     Weight 10/09/14 1602 190 lb (86.183 kg)     Height 10/09/14 1602 5\' 5"  (1.651 m)     Head Cir --      Peak Flow --      Pain Score 10/09/14 1603 2     Pain Loc --      Pain Edu? --      Excl. in [place]? --     Constitutional: Alert and oriented. Well appearing and in no acute distress. Eyes: Conjunctivae are normal. PERRL. EOMI. Head: Atraumatic. Nose: No congestion/rhinnorhea. Mouth/Throat: Mucous membranes are moist.  Oropharynx non-erythematous. Neck: No stridor.   Cardiovascular: Normal rate, regular rhythm. Grossly normal heart sounds.  Good peripheral circulation. Respiratory: Normal respiratory effort.  No retractions. Lungs CTAB. Gastrointestinal: Soft and  nontender. No distention. No abdominal bruits. No CVA tenderness. Musculoskeletal: No lower extremity tenderness nor edema.  No joint effusions. Neurologic:  Normal speech and language. No gross focal neurologic deficits are appreciated including weakness or sensation deficits. Skin:  Skin is warm, dry and intact. No rash noted. Psychiatric: Mood and affect are normal. Speech and behavior are normal.  ____________________________________________   LABS (all labs ordered are listed, but only abnormal results are displayed)  Labs Reviewed  BASIC METABOLIC PANEL - Abnormal; Notable for the following:    CO2 19 (*)    Glucose, Bld 118 (*)    All other components within normal limits  CBC - Abnormal; Notable for the following:    WBC 11.6 (*)    RBC 5.34 (*)    MCHC 31.8 (*)    All other components within normal limits  FIBRIN DERIVATIVES D-DIMER ([facility] ONLY) - Abnormal; Notable for the following:    Fibrin derivatives D-dimer ([facility]) 663 (*)    All other components within normal limits  TROPONIN I  TROPONIN I   ____________________________________________  EKG  ED ECG REPORT I, Naif Alabi,  Kayle Correa, the attending physician, personally viewed and interpreted this ECG.  Date: 10/09/2014 EKG Time: 16:03 Rate: 117 Rhythm: Sinus tachycardia QRS Axis: normal Intervals: normal ST/T Wave abnormalities: T-wave inversion in lead 3, otherwise unremarkable Conduction Disutrbances: none Narrative Interpretation: unremarkable  ____________________________________________  RADIOLOGY   Dg Chest 2 View  10/09/2014   CLINICAL DATA:  Chest pain and dizziness for 2 days.  EXAM: CHEST  2 VIEW  COMPARISON:  01/08/2013  FINDINGS: The heart size and mediastinal contours are within normal limits. Both lungs are clear. The visualized skeletal structures are unremarkable.  IMPRESSION: No active cardiopulmonary disease.   Electronically Signed   By: Earle Gell M.D.   On: 10/09/2014 18:33   Ct Head Wo Contrast  10/09/2014   CLINICAL DATA:  Dizziness and oral numbness  EXAM: CT HEAD WITHOUT CONTRAST  TECHNIQUE: Contiguous axial images were obtained from the base of the skull through the vertex without intravenous contrast.  COMPARISON:  01/11/2012  FINDINGS: No acute hemorrhage, infarct, or mass lesion is identified. No midline shift. Ventricles are normal in size. Orbits and paranasal sinuses are unremarkable. No skull fracture.  IMPRESSION: No acute intracranial finding.  Normal exam.   Electronically Signed   By: Conchita Paris M.D.   On: 10/09/2014 21:05   Ct Angio Chest Pe W/cm &/or Wo Cm  10/09/2014   CLINICAL DATA:  Chest pain, dizziness, numbness in the tongue and mouth  EXAM: CT ANGIOGRAPHY CHEST WITH CONTRAST  TECHNIQUE: Multidetector CT imaging of the chest was performed using the standard protocol during bolus administration of intravenous contrast. Multiplanar CT image reconstructions and MIPs were obtained to evaluate the vascular anatomy.  CONTRAST:  40mL OMNIPAQUE IOHEXOL 350 MG/ML SOLN  COMPARISON:  None.  FINDINGS: There is adequate opacification of the pulmonary arteries. There is no  pulmonary embolus. The main pulmonary artery, right main pulmonary artery and left main pulmonary arteries are normal in size. The heart size is normal. There is no pericardial effusion. The thoracic aorta is normal in caliber.  The lungs are clear. There is no focal consolidation, pleural effusion or pneumothorax.  There is no axillary, hilar, or mediastinal adenopathy.  There is no lytic or blastic osseous lesion.  The visualized portions of the upper abdomen are unremarkable.  Review of the MIP images confirms the above findings.  IMPRESSION: 1. No evidence of  pulmonary embolus.   Electronically Signed   By: Kathreen Devoid   On: 10/09/2014 21:06    ____________________________________________   PROCEDURES  Procedure(s) performed: None  Critical Care performed: No ____________________________________________   INITIAL IMPRESSION / ASSESSMENT AND PLAN / ED COURSE  Pertinent labs & imaging results that were available during my care of the patient were reviewed by me and considered in my medical decision making (see chart for details).  The patient was at low risk for pulmonary embolism and appropriate for a d-dimer.  However her dimer was slightly elevated and given her history it was reasonable to obtain a CTA chest to rule out the chest pain.  Additionally, given her persistent concern about intermittent worsening headaches and the numbness in her face, I obtained a noncontrast head CT to evaluate for possible CVA.  That was also negative.  The patient has had negative troponins 2.  I provided her with reassurance that while I cannot say with certainty that she has not having some chest pain related to her heart, it does not appear to require admission at this time.  I did offer admission to the patient for chest pain observation but she prefers to call her cardiologist tomorrow for follow-up.  I think this is appropriate given that she is at low risk.  I gave her my usual and customary return  precautions.   ____________________________________________  FINAL CLINICAL IMPRESSION(S) / ED DIAGNOSES  Final diagnoses:  Atypical chest pain      NEW MEDICATIONS STARTED DURING THIS VISIT:  New Prescriptions   No medications on file     Hinda Kehr, MD 10/09/14 2128

## 2014-10-09 NOTE — ED Notes (Signed)
Pt presents to ED with chest pain, dizziness and numbness in tongue and mouth. Pt reports having cardiac catheterization 2 years ago, states a stent was not placed.

## 2014-10-09 NOTE — ED Notes (Signed)
Patient transported to X-ray 

## 2014-10-09 NOTE — Discharge Instructions (Signed)

## 2014-10-09 NOTE — ED Notes (Signed)
Patient transported to CT 

## 2014-10-11 ENCOUNTER — Telehealth: Payer: Self-pay | Admitting: Interventional Cardiology

## 2014-10-11 DIAGNOSIS — M5136 Other intervertebral disc degeneration, lumbar region: Secondary | ICD-10-CM

## 2014-10-11 DIAGNOSIS — M51369 Other intervertebral disc degeneration, lumbar region without mention of lumbar back pain or lower extremity pain: Secondary | ICD-10-CM

## 2014-10-11 HISTORY — DX: Other intervertebral disc degeneration, lumbar region: M51.36

## 2014-10-11 HISTORY — DX: Other intervertebral disc degeneration, lumbar region without mention of lumbar back pain or lower extremity pain: M51.369

## 2014-10-11 NOTE — Telephone Encounter (Signed)
Pt c/o of Chest Pain: 1. Are you having CP right now? No. But she has had it within the last Hour  2. Are you experiencing any other symptoms (ex. SOB, nausea, vomiting, sweating)? SOB, Nausea and sweating  3. How long have you been experiencing CP? About 1 week but its gotten worse within the last 72-24 Hours  4. Is your CP continuous or coming and going? Continuous 5. Have you taken Nitroglycerin? No, she doesn't have any.   Comments: anytime she completes "long distance" walking, within 10 minutes she has chest oaun

## 2014-10-11 NOTE — Addendum Note (Signed)
Encounter addended by: Milinda Pointer, MD on: 10/11/2014 11:14 AM<BR>     Documentation filed: Problem List

## 2014-10-11 NOTE — Telephone Encounter (Signed)
I spoke with the patient. She reports that she has had intermittent chest pain for ~ 1& 1/2 weeks. She describes this as left sided chest pain to the breast area and up under the arm pit. She will have some neck discomfort that radiates to the ear and causes ear pain. She has had episodes of sweating and SOB associated with her symptoms. Symptoms usually occur with exertion. She developed an episode of mouth and tongue numbness on 10/09/14 and there fore was evaluated in the ER at [facility]. Per the ER physician note, the patient was ruled out for acute MI with negative troponin x 2. She was sent for a head CT and CT for possible PE due to elevated d-dimer- both were negative. Admission was offered to the patient, but she preferred out patient follow up. She was cathed on 2011 according to Dr. Thompson Caul last note from 12/08/12. Per his record, cath from 2011 showed a coronary disease with a 50% LAD lesion. She was seen in 2014 with complaints of chest discomfort and had a myoview that showed a large region of lateral wall ischemia. She was cathed by Dr. Tamala Julian on 01/11/13 and showed non-obstructive disease- no obstruction >30% with a normal EF- false (+) myoview. The patient states she was told she was having angina in the ER. She confirms she is only taking aspirin 81 mg daily- no other daily medications- though Medication list in EPIC reflects differently. I advised the patient that I cannot confirm if her symptoms are cardiac- she has a history of reflux, but states this feels very different. I have offered her follow up in the office next week and she is agreeable. She will see Truitt Merle, NP on 10/15/14

## 2014-10-15 ENCOUNTER — Emergency Department (HOSPITAL_COMMUNITY): Payer: Medicare Other

## 2014-10-15 ENCOUNTER — Emergency Department (HOSPITAL_COMMUNITY)
Admission: EM | Admit: 2014-10-15 | Discharge: 2014-10-16 | Disposition: A | Discharge status: Home / Self Care | Payer: Medicare Other | Attending: Emergency Medicine | Admitting: Emergency Medicine

## 2014-10-15 ENCOUNTER — Ambulatory Visit (INDEPENDENT_AMBULATORY_CARE_PROVIDER_SITE_OTHER): Payer: Medicare Other | Admitting: Nurse Practitioner

## 2014-10-15 ENCOUNTER — Encounter: Payer: Self-pay | Admitting: Nurse Practitioner

## 2014-10-15 ENCOUNTER — Encounter (HOSPITAL_COMMUNITY): Payer: Self-pay | Admitting: *Deleted

## 2014-10-15 VITALS — BP 117/80 | HR 109 | Ht 66.0 in | Wt 199.0 lb

## 2014-10-15 DIAGNOSIS — Z87891 Personal history of nicotine dependence: Secondary | ICD-10-CM | POA: Insufficient documentation

## 2014-10-15 DIAGNOSIS — K219 Gastro-esophageal reflux disease without esophagitis: Secondary | ICD-10-CM | POA: Insufficient documentation

## 2014-10-15 DIAGNOSIS — Z9089 Acquired absence of other organs: Secondary | ICD-10-CM | POA: Diagnosis not present

## 2014-10-15 DIAGNOSIS — Z9889 Other specified postprocedural states: Secondary | ICD-10-CM | POA: Diagnosis not present

## 2014-10-15 DIAGNOSIS — G709 Myoneural disorder, unspecified: Secondary | ICD-10-CM | POA: Insufficient documentation

## 2014-10-15 DIAGNOSIS — R0789 Other chest pain: Secondary | ICD-10-CM | POA: Diagnosis not present

## 2014-10-15 DIAGNOSIS — Z7982 Long term (current) use of aspirin: Secondary | ICD-10-CM | POA: Diagnosis not present

## 2014-10-15 DIAGNOSIS — F419 Anxiety disorder, unspecified: Secondary | ICD-10-CM | POA: Insufficient documentation

## 2014-10-15 DIAGNOSIS — Z880 Allergy status to penicillin: Secondary | ICD-10-CM | POA: Insufficient documentation

## 2014-10-15 DIAGNOSIS — Z7951 Long term (current) use of inhaled steroids: Secondary | ICD-10-CM | POA: Insufficient documentation

## 2014-10-15 DIAGNOSIS — E780 Pure hypercholesterolemia: Secondary | ICD-10-CM | POA: Insufficient documentation

## 2014-10-15 DIAGNOSIS — Z90710 Acquired absence of both cervix and uterus: Secondary | ICD-10-CM | POA: Diagnosis not present

## 2014-10-15 DIAGNOSIS — Z79899 Other long term (current) drug therapy: Secondary | ICD-10-CM | POA: Insufficient documentation

## 2014-10-15 DIAGNOSIS — I10 Essential (primary) hypertension: Secondary | ICD-10-CM | POA: Diagnosis not present

## 2014-10-15 DIAGNOSIS — E785 Hyperlipidemia, unspecified: Secondary | ICD-10-CM

## 2014-10-15 DIAGNOSIS — R1084 Generalized abdominal pain: Secondary | ICD-10-CM | POA: Diagnosis not present

## 2014-10-15 DIAGNOSIS — R109 Unspecified abdominal pain: Secondary | ICD-10-CM | POA: Diagnosis present

## 2014-10-15 DIAGNOSIS — F329 Major depressive disorder, single episode, unspecified: Secondary | ICD-10-CM | POA: Insufficient documentation

## 2014-10-15 DIAGNOSIS — E119 Type 2 diabetes mellitus without complications: Secondary | ICD-10-CM | POA: Insufficient documentation

## 2014-10-15 LAB — CBC WITH DIFFERENTIAL/PLATELET
Basophils Absolute: 0 10*3/uL (ref 0.0–0.1)
Basophils Relative: 0 % (ref 0–1)
EOS PCT: 1 % (ref 0–5)
Eosinophils Absolute: 0.2 10*3/uL (ref 0.0–0.7)
HEMATOCRIT: 44.7 % (ref 36.0–46.0)
Hemoglobin: 14.1 g/dL (ref 12.0–15.0)
LYMPHS ABS: 4 10*3/uL (ref 0.7–4.0)
LYMPHS PCT: 36 % (ref 12–46)
MCH: 28.8 pg (ref 26.0–34.0)
MCHC: 31.5 g/dL (ref 30.0–36.0)
MCV: 91.2 fL (ref 78.0–100.0)
MONO ABS: 0.5 10*3/uL (ref 0.1–1.0)
MONOS PCT: 5 % (ref 3–12)
NEUTROS ABS: 6.3 10*3/uL (ref 1.7–7.7)
Neutrophils Relative %: 58 % (ref 43–77)
PLATELETS: 300 10*3/uL (ref 150–400)
RBC: 4.9 MIL/uL (ref 3.87–5.11)
RDW: 14 % (ref 11.5–15.5)
WBC: 11 10*3/uL — ABNORMAL HIGH (ref 4.0–10.5)

## 2014-10-15 LAB — CBC
HCT: 44.2 % (ref 36.0–46.0)
Hemoglobin: 14.6 g/dL (ref 12.0–15.0)
MCHC: 33 g/dL (ref 30.0–36.0)
MCV: 88 fl (ref 78.0–100.0)
Platelets: 306 10*3/uL (ref 150.0–400.0)
RBC: 5.03 Mil/uL (ref 3.87–5.11)
RDW: 14.1 % (ref 11.5–15.5)
WBC: 10.1 10*3/uL (ref 4.0–10.5)

## 2014-10-15 LAB — COMPREHENSIVE METABOLIC PANEL
ALT: 17 U/L (ref 14–54)
AST: 17 U/L (ref 15–41)
Albumin: 3.7 g/dL (ref 3.5–5.0)
Alkaline Phosphatase: 59 U/L (ref 38–126)
Anion gap: 8 (ref 5–15)
BILIRUBIN TOTAL: 0.5 mg/dL (ref 0.3–1.2)
BUN: 20 mg/dL (ref 6–20)
CHLORIDE: 106 mmol/L (ref 101–111)
CO2: 22 mmol/L (ref 22–32)
CREATININE: 1.04 mg/dL — AB (ref 0.44–1.00)
Calcium: 9.3 mg/dL (ref 8.9–10.3)
GFR, EST NON AFRICAN AMERICAN: 59 mL/min — AB (ref >60)
Glucose, Bld: 83 mg/dL (ref 65–99)
POTASSIUM: 4.2 mmol/L (ref 3.5–5.1)
Sodium: 136 mmol/L (ref 135–145)
TOTAL PROTEIN: 6.6 g/dL (ref 6.5–8.1)

## 2014-10-15 LAB — POC OCCULT BLOOD, ED: Fecal Occult Bld: NEGATIVE

## 2014-10-15 LAB — BASIC METABOLIC PANEL
BUN: 14 mg/dL (ref 6–23)
CO2: 24 mEq/L (ref 19–32)
Calcium: 9.4 mg/dL (ref 8.4–10.5)
Chloride: 105 mEq/L (ref 96–112)
Creatinine, Ser: 0.96 mg/dL (ref 0.40–1.20)
GFR: 64.03 mL/min (ref >60.00)
Glucose, Bld: 94 mg/dL (ref 70–99)
Potassium: 4.2 mEq/L (ref 3.5–5.1)
Sodium: 139 mEq/L (ref 135–145)

## 2014-10-15 LAB — PROTIME-INR
INR: 0.98 (ref 0.00–1.49)
PROTHROMBIN TIME: 13.2 s (ref 11.6–15.2)

## 2014-10-15 LAB — TROPONIN I: TNIDX: 0 ug/l (ref 0.00–0.06)

## 2014-10-15 MED ORDER — SODIUM CHLORIDE 0.9 % IV BOLUS (SEPSIS)
1000.0000 mL | Freq: Once | INTRAVENOUS | Status: AC
  Administered 2014-10-15: 1000 mL via INTRAVENOUS

## 2014-10-15 MED ORDER — PANTOPRAZOLE SODIUM 40 MG IV SOLR
40.0000 mg | INTRAVENOUS | Status: AC
  Administered 2014-10-15: 40 mg via INTRAVENOUS
  Filled 2014-10-15: qty 40

## 2014-10-15 MED ORDER — IOHEXOL 300 MG/ML  SOLN: 25.0000 mL | Freq: Once | INTRAMUSCULAR | Status: DC | PRN

## 2014-10-15 MED ORDER — IOHEXOL 300 MG/ML  SOLN
100.0000 mL | Freq: Once | INTRAMUSCULAR | Status: AC | PRN
  Administered 2014-10-15: 100 mL via INTRAVENOUS

## 2014-10-15 NOTE — Patient Instructions (Addendum)
We will be checking the following labs today - stat Troponin, BMET, CBC    Medication Instructions:    Continue with your current medicines.     Testing/Procedures To Be Arranged:  Lexiscan Myoview  Follow-Up:   Call your GI doctor today to report the dark stools.     Other Special Instructions:   N/A  Call the [place] office at [phone] if you have any questions, problems or concerns.

## 2014-10-15 NOTE — ED Notes (Signed)
Pt states lower abdominal pain and reports black stools.  Said she was seen at [facility] on Thursday for chest pains.

## 2014-10-15 NOTE — ED Notes (Signed)
PA to see pt before RN assessment.

## 2014-10-15 NOTE — Progress Notes (Signed)
CARDIOLOGY OFFICE NOTE  Date:  10/15/2014    Heather Haley Date of Birth: 10-23-1960 Medical Record #[id number]  PCP:  Lottie Dawson, MD  Cardiologist:  Tamala Julian    Chief Complaint  Patient presents with  . Chest Pain    Follow up from ER visit - seen for Dr. Tamala Julian    History of Present Illness: Heather Haley is a 54 y.o. female who presents today for a follow up visit after being seen in the ER. She is seen for Dr. Tamala Julian. She has a history of coronary atherosclerosis with 50% LAD by cath in 2011, DM, obesity, HTN, HLD, unspecified neuromuscular disorder, anxiety and GERD.   Has not been seen here since October of 2014. Had Myoview performed for chest pain and risk factors - showed large area of lateral ischemia and she was referred for cardiac cath. Non obstructive disease noted and medical management encouraged.   In the ER at [facility] last week with chest pain/dizziness/SOB. Negative CT of the head and negative CT for PE noted. 2 negative troponins. D dimer was elevated. She refused overnight admission and wanted to follow up here in the office. Thus added to my schedule for today.   Comes in today. Here alone. Says she is still having chest pains. Didn't do "squat" over the weekend and still feels bad. Still short of breath. Still dizzy. Says this has been pretty constant since her ER visit. Says activity makes this worse. Says she has swelling in her ankle and knee on the left. No passing out. Notes her stool is black and has not told anyone about this despite having EGD less than 2 weeks ago - this noted gastritis/prior healed ulcer. She says she is not on iron and is taking her PPI. She has no chest pain at this time. Notes more tachycardia.   Past Medical History  Diagnosis Date  . Hypertension   . IBS (irritable bowel syndrome)   . Postoperative nausea and vomiting   . Diabetes mellitus   . Left ankle instability   . Headache(784.0)   . Left knee  DJD   . Depression   . Frequency of urination   . GERD (gastroesophageal reflux disease)   . High cholesterol   . Neuromuscular disorder   . Anxiety     Past Surgical History  Procedure Laterality Date  . Ankle surgery    . Abdominal hysterectomy    . Knee arthroscopy  1997    left knee  . Total knee arthroplasty  08/30/2011    Procedure: TOTAL KNEE ARTHROPLASTY;  Surgeon: Lorn Junes, MD;  Location: [place];  Service: Orthopedics;  Laterality: Left;  . Colonoscopy  2013  . Appendectomy    . Left heart catheterization with coronary angiogram N/A 01/11/2013    Procedure: LEFT HEART CATHETERIZATION WITH CORONARY ANGIOGRAM;  Surgeon: Sinclair Grooms, MD;  Location: [facility] CATH LAB;  Service: Cardiovascular;  Laterality: N/A;  . Esophagogastroduodenoscopy (egd) with propofol N/A 10/03/2014    Procedure: ESOPHAGOGASTRODUODENOSCOPY (EGD) WITH PROPOFOL;  Surgeon: Josefine Class, MD;  Location: [facility] ENDOSCOPY;  Service: Endoscopy;  Laterality: N/A;     Medications: Current Outpatient Prescriptions  Medication Sig Dispense Refill  . aspirin EC 81 MG tablet Take 81 mg by mouth daily.    . bisacodyl (DULCOLAX) 5 MG EC tablet Take 5 mg by mouth daily as needed for moderate constipation.    Marland Kitchen buPROPion (WELLBUTRIN XL) 150 MG 24 hr  tablet Take 300 mg by mouth daily.     . busPIRone (BUSPAR) 5 MG tablet Take 5 mg by mouth 2 (two) times daily.    . calcium carbonate (TUMS - DOSED IN MG ELEMENTAL CALCIUM) 500 MG chewable tablet Chew 1 tablet by mouth daily.    . cetirizine (ZYRTEC) 10 MG tablet Take 1 tablet (10 mg total) by mouth at bedtime. 30 tablet 11  . cholecalciferol (VITAMIN D) 400 UNITS TABS tablet Take 2,000 Units by mouth.     . Dexlansoprazole (DEXILANT) 30 MG capsule Take 30 mg by mouth daily.    . fluticasone (FLONASE) 50 MCG/ACT nasal spray Place 2 sprays into both nostrils daily.    Marland Kitchen glipiZIDE (GLUCOTROL XL) 5 MG 24 hr tablet Take 5 mg by mouth at bedtime.    Marland Kitchen  HYDROcodone-acetaminophen (NORCO/VICODIN) 5-325 MG per tablet Take 1 tablet by mouth daily as needed. 30 tablet 0  . Hypromellose (ARTIFICIAL TEARS OP) Apply 2 drops to eye daily as needed (for dry eyes). Dry eye    . lubiprostone (AMITIZA) 24 MCG capsule Take 24 mcg by mouth 2 (two) times daily with a meal.    . metFORMIN (GLUCOPHAGE) 1000 MG tablet Take 1,000 mg by mouth 2 (two) times daily with a meal.    . methylnaltrexone (RELISTOR) 12 MG/0.6ML SOLN injection Inject into the skin every other day.    Marland Kitchen omeprazole (PRILOSEC) 40 MG capsule Take 40 mg by mouth daily.    . Polyethylene Glycol 3350 (MIRALAX PO) Take by mouth.    . pravastatin (PRAVACHOL) 80 MG tablet Take 40 mg by mouth at bedtime.     . SUMAtriptan (IMITREX) 100 MG tablet Take 1 tab at onset of headache, may take second dose after 1 hour. Do not take more than 2 a week 10 tablet 6  . topiramate (TOPAMAX) 100 MG tablet Take 1-1/2 tablet twice a day 90 tablet 6  . traMADol (ULTRAM) 50 MG tablet TAKE 1 TABLET BY MOUTH THREE TIMES DAILY AS NEEDED FOR PAIN. (Patient taking differently: 100 mg 4 (four) times daily. TAKE 1 TABLET BY MOUTH THREE TIMES DAILY AS NEEDED FOR PAIN.) 90 tablet 1  . traZODone (DESYREL) 50 MG tablet Take 50 mg by mouth at bedtime.     No current facility-administered medications for this visit.    Allergies: Allergies  Allergen Reactions  . Codeine     REACTION: hives  . Cymbalta [Duloxetine Hcl]   . Lyrica [Pregabalin]   . Morphine And Related   . Nortripytline Hcl [Nortriptyline]     Hair loss and night mares  . Nsaids     REACTION: palpitations, diaphoresis  . Penicillins     REACTION: upset stomach    Social History: The patient  reports that she quit smoking about 19 years ago. Her smoking use included Cigarettes. She has a 30 pack-year smoking history. She has never used smokeless tobacco. She reports that she does not drink alcohol or use illicit drugs.   Family History: The patient's  family history includes Cancer in her mother; Heart disease in her father; Hypertension in her father.   Review of Systems: Please see the history of present illness.   Otherwise, the review of systems is positive for chest pain, DOE, abdominal pain, blood in stool, depression, back pain, muscle pain, dizziness, easy bruising, excessive sweating, skipped heart beats, constipation, anxiety, joint swelling and headaches.   All other systems are reviewed and negative.   Physical Exam: VS:  BP 117/80 mmHg  Pulse 109  Ht 5\' 6"  (1.676 m)  Wt 199 lb (90.266 kg)  BMI 32.13 kg/m2 .  BMI Body mass index is 32.13 kg/(m^2).  Wt Readings from Last 3 Encounters:  10/15/14 199 lb (90.266 kg)  10/09/14 190 lb (86.183 kg)  10/03/14 190 lb (86.183 kg)    General: Chronically ill in appearance.  Weight is up 9 pounds in less than a week. She is in no acute distress.  HEENT: Normal. Neck: Supple, no JVD, carotid bruits, or masses noted.  Cardiac: Regular rate and rhythm. Rate is a little fast. No significant edema that I see.  Respiratory:  Lungs are clear to auscultation bilaterally with normal work of breathing.  GI: Soft and nontender.  MS: No deformity or atrophy. Gait and ROM intact. She has a brace on her left leg.  Skin: Warm and dry. Color is sallow. Neuro:  Strength and sensation are intact and no gross focal deficits noted.  Psych: Alert, appropriate and with normal affect.   LABORATORY DATA:  EKG:  EKG is ordered today. This shows septal Q's with poor R wave progression - reviewed with Dr. Tamala Julian. EKG from recent ER visit reviewed as well.   Lab Results  Component Value Date   WBC 11.6* 10/09/2014   HGB 14.9 10/09/2014   HCT 46.9 10/09/2014   PLT 321 10/09/2014   GLUCOSE 118* 10/09/2014   ALT 25 03/12/2014   AST 34 03/12/2014   NA 140 10/09/2014   K 4.1 10/09/2014   CL 111 10/09/2014   CREATININE 0.96 10/09/2014   BUN 12 10/09/2014   CO2 19* 10/09/2014   TSH 2.252 10/13/2012    INR 0.98 01/11/2013   HGBA1C 6.1* 06/19/2012    Lab Results  Component Value Date   CKTOTAL 54 07/29/2006   CKMB 0.8 07/28/2006   TROPONINI <0.03 10/09/2014   Fibrin derivatives D-dimer ([facility]) 0 - 499  663 (H)         BNP (last 3 results) No results for input(s): BNP in the last 8760 hours.  ProBNP (last 3 results) No results for input(s): PROBNP in the last 8760 hours.   Other Studies Reviewed Today:   ANGIOGRAPHIC DATA: The left main coronary artery is short and widely patent..  The left anterior descending artery is reaches the left ventricular apex. A large bifurcating diagonal arises from the proximal LAD. Irregularities are noted in the proximal, mid, and distal LAD. The LAD reaches the left ventricular apex..  The left circumflex artery gives origin to a large circumflex trifurcates distally from its dominant obtuse marginal. No significant obstruction is noted to.  The right coronary artery is dominant and has mid vessel irregularity. No significant obstruction is noted.Marland Kitchen  LEFT VENTRICULOGRAM: Left ventricular angiogram was done in the 30 RAO projection and revealed normal size and contractility. EF 60%   IMPRESSIONS: 1. Nonobstructive coronary artery disease with luminal irregularities in the LAD/diagonal system and the right coronary. No obstruction greater than 30% is noted.  2. Normal left ventricular function, with LVEF 60%  3. False positive exercise treadmill test   RECOMMENDATION: No further ischemic evaluation. Chest pain unlikely related to cardiac etiology. Continue aggressive risk factor modification to prevent downstream cardiovascular disease.  Assessment/Plan: 1. Multitude of somatic complaints - this includes chest pain/dyspnea/dizziness - recent negative ER evaluation - discussed with Dr. Tamala Julian here in the office - will recheck her lab. Update her Myoview. Further disposition to follow.   2. Hematochezia -  she is going to call GI when  she leaves our office today - this may explain all of her symptoms and her tachycardia - recheck her labs today as well.   3. Nonobstructive CAD noted on last cath from 01/2013.   4. HLD - on statin therapy.   Current medicines are reviewed with the patient today.  The patient does not have concerns regarding medicines other than what has been noted above.  The following changes have been made:  See above.  Labs/ tests ordered today include:    Orders Placed This Encounter  Procedures  . Basic metabolic panel  . CBC  . Troponin I  . Myocardial Perfusion Imaging  . EKG 12-Lead     Disposition:   Further disposition to follow.  Patient is agreeable to this plan and will call if any problems develop in the interim.   Signed: Burtis Junes, RN, ANP-C 10/15/2014 12:25 PM  [facility] [address] [place], [place]  [address] Phone: [phone] Fax: [phone]

## 2014-10-16 MED ORDER — DICYCLOMINE HCL 20 MG PO TABS: 20.0000 mg | ORAL_TABLET | Freq: Two times a day (BID) | ORAL | Status: DC

## 2014-10-16 NOTE — Discharge Instructions (Signed)

## 2014-10-16 NOTE — ED Provider Notes (Signed)
CSN: [id number]     Arrival date & time 10/15/14  1705 History   First MD Initiated Contact with Patient 10/15/14 2045     Chief Complaint  Patient presents with  . GI Bleeding     (Consider location/radiation/quality/duration/timing/severity/associated sxs/prior Treatment) HPI   Patient is a 54 year old female with history of GERD, hypertension, IBS, diabetes, who presents to the emergency department, she states at the direction of her GI doctor. She was seen earlier today by her cardiologist for a "multitude of somatic complaints" she also complained of hematochezia symptoms and they instructed her to call GI. She complains of "tarry and coffee ground stools" foul loose stools, and abdominal pain presently one hour after eating, which progresses to diffuse abdominal cramping.  She states that presently month ago she was constipated, but then after treatment from GI she began to have 2-4 bowel movements per day with associated abdominal pain. She states that she has completed a treatment for H. pylori, and that she will be retested in the near future by her GI doctor.  She was recently seen at [facility] with complaints of chest pain, dizziness and shortness of breath without any acute findings, those complaints were followed up on with her cardiologist today.  She currently denies chest pain, shortness of breath, lightheadedness, dizziness, she exertional shortness of breath. She has not had any syncopal episodes. He has not had any fever, sick contacts, any travel.  Past Medical History  Diagnosis Date  . Hypertension   . IBS (irritable bowel syndrome)   . Postoperative nausea and vomiting   . Diabetes mellitus   . Left ankle instability   . Headache(784.0)   . Left knee DJD   . Depression   . Frequency of urination   . GERD (gastroesophageal reflux disease)   . High cholesterol   . Neuromuscular disorder   . Anxiety    Past Surgical History  Procedure Laterality Date  . Ankle surgery     . Abdominal hysterectomy    . Knee arthroscopy  1997    left knee  . Total knee arthroplasty  08/30/2011    Procedure: TOTAL KNEE ARTHROPLASTY;  Surgeon: Lorn Junes, MD;  Location: [place];  Service: Orthopedics;  Laterality: Left;  . Colonoscopy  2013  . Appendectomy    . Left heart catheterization with coronary angiogram N/A 01/11/2013    Procedure: LEFT HEART CATHETERIZATION WITH CORONARY ANGIOGRAM;  Surgeon: Sinclair Grooms, MD;  Location[facility] C[facility];  Service: Cardiovascular;  Laterality: N/A;  . Esophagogastroduodenoscopy (egd) with propofol N/A 10/03/2014    Procedure: ESOPHAGOGASTRODUODENOSCOPY (EGD) WITH PROPOFOL;  Surgeon: Josefine Class, MD;  Location: [facility] ENDOSCOPY;  Service: Endoscopy;  Laterality: N/A;   Family History  Problem Relation Age of Onset  . Cancer Mother   . Hypertension Father   . Heart disease Father    Social History  Substance Use Topics  . Smoking status: Former Smoker -- 2.00 packs/day for 15 years    Types: Cigarettes    Quit date: 02/09/1995  . Smokeless tobacco: Never Used  . Alcohol Use: No   OB History    No data available     Review of Systems  Constitutional: Negative.   HENT: Negative.   Respiratory: Negative.   Cardiovascular: Negative.   Genitourinary: Negative.   Musculoskeletal: Negative.   Skin: Negative.   Neurological: Negative.   Psychiatric/Behavioral: Negative.     Allergies  Codeine; Cymbalta; Lyrica; Morphine and related; Nortripytline hcl;  Nsaids; and Penicillins  Home Medications   Prior to Admission medications   Medication Sig Start Date End Date Taking? Authorizing Provider  aspirin EC 81 MG tablet Take 81 mg by mouth daily. 01/08/13  Yes Belva Crome, MD  bisacodyl (DULCOLAX) 5 MG EC tablet Take 5 mg by mouth daily as needed for moderate constipation.   Yes Historical Provider, MD  buPROPion (WELLBUTRIN XL) 150 MG 24 hr tablet Take 300 mg by mouth daily.    Yes Historical Provider, MD  busPIRone  (BUSPAR) 5 MG tablet Take 5 mg by mouth at bedtime.    Yes Historical Provider, MD  calcium carbonate (TUMS - DOSED IN MG ELEMENTAL CALCIUM) 500 MG chewable tablet Chew 1 tablet by mouth at bedtime as needed for indigestion or heartburn.    Yes Historical Provider, MD  cetirizine (ZYRTEC) 10 MG tablet Take 1 tablet (10 mg total) by mouth at bedtime.   Yes Susy Frizzle, MD  Cholecalciferol (VITAMIN D) 2000 UNITS CAPS Take 2,000 Units by mouth daily.   Yes Historical Provider, MD  Dexlansoprazole (DEXILANT) 30 MG capsule Take 30 mg by mouth daily.   Yes Historical Provider, MD  fluticasone (FLONASE) 50 MCG/ACT nasal spray Place 2 sprays into both nostrils daily as needed for allergies.    Yes Historical Provider, MD  gabapentin (NEURONTIN) 100 MG capsule Take 100 mg by mouth 3 (three) times daily.   Yes Historical Provider, MD  HYDROcodone-acetaminophen (NORCO/VICODIN) 5-325 MG per tablet Take 1 tablet by mouth daily as needed. Patient taking differently: Take 0.5 tablets by mouth 2 (two) times daily.  05/03/13  Yes Susy Frizzle, MD  Hypromellose (ARTIFICIAL TEARS OP) Apply 2 drops to eye daily as needed (for dry eyes). Dry eye   Yes Historical Provider, MD  lubiprostone (AMITIZA) 24 MCG capsule Take 24 mcg by mouth 2 (two) times daily with a meal.   Yes Historical Provider, MD  metFORMIN (GLUCOPHAGE) 1000 MG tablet Take 1,000 mg by mouth 2 (two) times daily with a meal.   Yes Historical Provider, MD  omeprazole (PRILOSEC) 40 MG capsule Take 40 mg by mouth at bedtime.    Yes Historical Provider, MD  pravastatin (PRAVACHOL) 40 MG tablet Take 40 mg by mouth at bedtime.   Yes Historical Provider, MD  SUMAtriptan (IMITREX) 100 MG tablet Take 1 tab at onset of headache, may take second dose after 1 hour. Do not take more than 2 a week 05/18/13  Yes Cameron Sprang, MD  topiramate (TOPAMAX) 100 MG tablet Take 1-1/2 tablet twice a day Patient taking differently: Take 200 mg by mouth 2 (two) times daily.   05/18/13  Yes Cameron Sprang, MD  traMADol (ULTRAM) 50 MG tablet TAKE 1 TABLET BY MOUTH THREE TIMES DAILY AS NEEDED FOR PAIN. Patient taking differently: Take 100 mg by mouth 3 (three) times daily.  10/19/13  Yes Stefanie Libel, MD  traZODone (DESYREL) 50 MG tablet Take 50 mg by mouth at bedtime.   Yes Historical Provider, MD   BP 103/63 mmHg  Pulse 76  Temp(Src) 97.8 F (36.6 C) (Oral)  Resp 20  SpO2 96% Physical Exam  Constitutional: She is oriented to person, place, and time. She appears well-developed and well-nourished. No distress.  HENT:  Head: Normocephalic and atraumatic.  Nose: Nose normal.  Mouth/Throat: Oropharynx is clear and moist. No oropharyngeal exudate.  Eyes: Conjunctivae and EOM are normal. Pupils are equal, round, and reactive to light. Right eye exhibits no discharge.  Left eye exhibits no discharge. No scleral icterus.  Neck: Normal range of motion. No JVD present. No tracheal deviation present. No thyromegaly present.  Cardiovascular: Normal rate, regular rhythm, normal heart sounds and intact distal pulses.  Exam reveals no gallop and no friction rub.   No murmur heard. Pulmonary/Chest: Effort normal and breath sounds normal. No respiratory distress. She has no wheezes. She has no rales. She exhibits no tenderness.  Abdominal: Soft. Normal appearance and bowel sounds are normal. She exhibits no distension, no fluid wave, no ascites and no mass. There is no tenderness. There is no rebound and no guarding.  Abdomen obese, soft, nontender to palpation, nondistended, normal bowel sounds 4, no rebound or guarding  Genitourinary: Rectal exam shows no external hemorrhoid, no internal hemorrhoid, no fissure, no mass, no tenderness and anal tone normal. Guaiac negative stool.  Musculoskeletal: Normal range of motion. She exhibits no edema or tenderness.  Lymphadenopathy:    She has no cervical adenopathy.  Neurological: She is alert and oriented to person, place, and time. She  has normal reflexes. No cranial nerve deficit. She exhibits normal muscle tone. Coordination normal.  Skin: Skin is warm, dry and intact. No rash noted. She is not diaphoretic. No cyanosis or erythema. No pallor. Nails show no clubbing.  Psychiatric: She has a normal mood and affect. Her behavior is normal. Judgment and thought content normal.  Nursing note and vitals reviewed.   ED Course  Procedures (including critical care time) Labs Review Labs Reviewed  CBC WITH DIFFERENTIAL/PLATELET - Abnormal; Notable for the following:    WBC 11.0 (*)    All other components within normal limits  COMPREHENSIVE METABOLIC PANEL - Abnormal; Notable for the following:    Creatinine, Ser 1.04 (*)    GFR calc non Af Amer 59 (*)    All other components within normal limits  PROTIME-INR  POC OCCULT BLOOD, ED   Imaging Review Ct Abdomen Pelvis W Contrast  10/16/2014   CLINICAL DATA:  Lower abdominal pain and black stools. Seen at [facility] on Thursday for chest pain.  EXAM: CT ABDOMEN AND PELVIS WITH CONTRAST  TECHNIQUE: Multidetector CT imaging of the abdomen and pelvis was performed using the standard protocol following bolus administration of intravenous contrast.  CONTRAST:  127mL OMNIPAQUE IOHEXOL 300 MG/ML  SOLN  COMPARISON:  03/12/2014  FINDINGS: Mild dependent changes in the lung bases.  The liver, spleen, gallbladder, pancreas, adrenal glands, inferior vena cava, and retroperitoneal lymph nodes are unremarkable. Calcification of the aorta without aneurysm. 2 mm stone in the mid right kidney without evidence of obstruction. No hydronephrosis or hydroureter on either side. Nephrograms are symmetrical. Diffusely stool-filled colon without abnormal distention. Stomach and small bowel are not abnormally distended. No wall thickening is appreciated. No free air or free fluid in the abdomen. Abdominal wall musculature appears intact.  Pelvis: Surgical absence of the appendix. Bladder wall is not thickened.  Surgical absence of the uterus. No pelvic mass or lymphadenopathy. No free or loculated pelvic fluid collections. No evidence of diverticulitis. No destructive bone lesions.  IMPRESSION: No acute process demonstrated in the abdomen or pelvis. No evidence of bowel obstruction or inflammation. Diffusely stool-filled colon likely indicating constipation. Nonobstructing stone in the right kidney.   Electronically Signed   By: Lucienne Capers M.D.   On: 10/16/2014 00:29   I have personally reviewed and evaluated these images and lab results as part of my medical decision-making.   EKG Interpretation None  MDM   Final diagnoses:  None   Patient reports the ER with complaint of GI bleeding - Hemoccult-negative CT abdomen and pelvis demonstrates stool-filled colon She's not complaining of pain at this time there is been no active vomiting she is afebrile without tachycardia Pt will be d/c home with tx for constipation and f/up with her GI.    Filed Vitals:   10/15/14 2215 10/15/14 2230 10/15/14 2245 10/15/14 2300  BP: 130/79 105/77 113/70 103/63  Pulse: 81 82 74 76  Temp:      TempSrc:      Resp:      SpO2: 98% 98% 98% 96%     Delsa Grana, PA-C 10/17/14 0051  Forde Dandy, MD 10/17/14 [phone]

## 2014-10-17 ENCOUNTER — Telehealth (HOSPITAL_COMMUNITY): Payer: Self-pay | Admitting: *Deleted

## 2014-10-17 NOTE — Telephone Encounter (Signed)
Patient given detailed instructions per Myocardial Perfusion Study Information Sheet for test on 10/22/14 at 930am. Patient notified to arrive 15 minutes early and that it is imperative to arrive on time for appointment to keep from having the test rescheduled.  If you need to cancel or reschedule your appointment, please call the office within 24 hours of your appointment. Failure to do so may result in a cancellation of your appointment, and a $50 no show fee. Patient verbalized understanding. Hubbard Robinson, RN

## 2014-10-17 NOTE — Telephone Encounter (Signed)
Left message on voicemail in reference to upcoming appointment scheduled for 10/22/14. Phone number given for a call back so details instructions can be given. Husna Krone J Raul Torrance, RN 

## 2014-10-22 ENCOUNTER — Ambulatory Visit (HOSPITAL_COMMUNITY): Payer: Medicare Other | Attending: Cardiology

## 2014-10-22 DIAGNOSIS — R0789 Other chest pain: Secondary | ICD-10-CM | POA: Diagnosis not present

## 2014-10-22 DIAGNOSIS — I10 Essential (primary) hypertension: Secondary | ICD-10-CM | POA: Insufficient documentation

## 2014-10-22 DIAGNOSIS — E785 Hyperlipidemia, unspecified: Secondary | ICD-10-CM | POA: Diagnosis not present

## 2014-10-22 DIAGNOSIS — R079 Chest pain, unspecified: Secondary | ICD-10-CM | POA: Insufficient documentation

## 2014-10-22 MED ORDER — TECHNETIUM TC 99M SESTAMIBI GENERIC - CARDIOLITE
31.5000 | Freq: Once | INTRAVENOUS | Status: AC | PRN
  Administered 2014-10-22: 32 via INTRAVENOUS

## 2014-10-22 MED ORDER — REGADENOSON 0.4 MG/5ML IV SOLN
0.4000 mg | Freq: Once | INTRAVENOUS | Status: AC
  Administered 2014-10-22: 0.4 mg via INTRAVENOUS

## 2014-10-23 ENCOUNTER — Ambulatory Visit (HOSPITAL_COMMUNITY): Payer: Medicare Other | Attending: Cardiology

## 2014-10-23 DIAGNOSIS — R0989 Other specified symptoms and signs involving the circulatory and respiratory systems: Secondary | ICD-10-CM

## 2014-10-23 LAB — MYOCARDIAL PERFUSION IMAGING
LV dias vol: 82 mL
LV sys vol: 32 mL
Peak HR: 96 {beats}/min
RATE: 0.32
Rest HR: 65 {beats}/min
SDS: 2
SRS: 0
SSS: 2
TID: 0.99

## 2014-10-23 MED ORDER — TECHNETIUM TC 99M SESTAMIBI GENERIC - CARDIOLITE
32.2000 | Freq: Once | INTRAVENOUS | Status: AC | PRN
  Administered 2014-10-23: 32.2 via INTRAVENOUS

## 2014-10-24 ENCOUNTER — Encounter: Payer: Self-pay | Admitting: Nurse Practitioner

## 2014-10-25 ENCOUNTER — Telehealth: Payer: Self-pay | Admitting: *Deleted

## 2014-10-25 DIAGNOSIS — R002 Palpitations: Secondary | ICD-10-CM

## 2014-10-25 NOTE — Telephone Encounter (Signed)
--   Message -----    From: [name]    Sent: 10/24/2014  7:04 PM EDT      To: Truitt Merle, NP Subject: Non-Urgent Medical Question  I got your message that the stress test was fine but what is causing my low pause number high (just checked it, 115/70/98)? What is causing the pain on my left side of my chest, under my arm area hurt at times and this isn't GERD because I know how that feels and this is different? Can you tell me, no chance of no strokes or anything else?   I replied to pt via my chart that I would call her to discuss her symptoms.  I spoke with pt who reports she is concerned about heart rate. States it runs 80-114 at times but will go to 70 when at rest.  Blood pressure 115/70. She checks with wrist BP cuff.  She is not feeling palpitations.   Heart rate during [place] was 65-96.  Pt reports she is continuing to have light-headedness and chest pains on left side of chest that go to under left arm pit.  2 episodes yesterday.  Pt states she does not think this is her reflux.  She reports pain is the same as when she saw Truitt Merle, NP and has not worsened or improved.  Was to follow up with GI.  She reports she has done this and is going to be retested for H. Pylori. States bowel movements are no longer dark.  Will forward to Dr. Tamala Julian for recommendations.

## 2014-10-27 NOTE — Telephone Encounter (Signed)
This patient needs to wear a 30 day monitor to rule out atrial fibrillation or some other arrhythmia.

## 2014-10-28 NOTE — Telephone Encounter (Signed)
Called to give pt Dr.Smiths recommendation. [email or web address] 

## 2014-10-28 NOTE — Telephone Encounter (Signed)
Pt aware of Dr.Smith's recommendation This patient needs to wear a 30 day monitor to rule out atrial fibrillation or some other arrhythmia. Pt agreeable with plan. Adv pt a scheduler from our office will call her to schedule. Pt verbalized understanding.

## 2014-10-30 ENCOUNTER — Ambulatory Visit (INDEPENDENT_AMBULATORY_CARE_PROVIDER_SITE_OTHER): Payer: Medicare Other

## 2014-10-30 DIAGNOSIS — R002 Palpitations: Secondary | ICD-10-CM | POA: Diagnosis not present

## 2014-11-11 ENCOUNTER — Ambulatory Visit: Payer: Medicare Other | Admitting: Pain Medicine

## 2014-11-18 ENCOUNTER — Encounter: Payer: Medicare Other | Admitting: Pain Medicine

## 2014-11-19 ENCOUNTER — Encounter: Payer: Self-pay | Admitting: Pain Medicine

## 2014-11-19 ENCOUNTER — Ambulatory Visit: Payer: Medicare Other | Attending: Pain Medicine | Admitting: Pain Medicine

## 2014-11-19 VITALS — BP 115/69 | HR 73 | Temp 98.2°F | Resp 16 | Ht 65.0 in | Wt 196.0 lb

## 2014-11-19 DIAGNOSIS — M5136 Other intervertebral disc degeneration, lumbar region: Secondary | ICD-10-CM | POA: Diagnosis not present

## 2014-11-19 DIAGNOSIS — E119 Type 2 diabetes mellitus without complications: Secondary | ICD-10-CM

## 2014-11-19 DIAGNOSIS — M545 Low back pain, unspecified: Secondary | ICD-10-CM

## 2014-11-19 DIAGNOSIS — E1142 Type 2 diabetes mellitus with diabetic polyneuropathy: Secondary | ICD-10-CM

## 2014-11-19 DIAGNOSIS — E1140 Type 2 diabetes mellitus with diabetic neuropathy, unspecified: Secondary | ICD-10-CM | POA: Diagnosis not present

## 2014-11-19 DIAGNOSIS — M5126 Other intervertebral disc displacement, lumbar region: Secondary | ICD-10-CM | POA: Diagnosis not present

## 2014-11-19 DIAGNOSIS — Z8669 Personal history of other diseases of the nervous system and sense organs: Secondary | ICD-10-CM

## 2014-11-19 DIAGNOSIS — Z79899 Other long term (current) drug therapy: Secondary | ICD-10-CM

## 2014-11-19 DIAGNOSIS — M47816 Spondylosis without myelopathy or radiculopathy, lumbar region: Secondary | ICD-10-CM

## 2014-11-19 DIAGNOSIS — G43909 Migraine, unspecified, not intractable, without status migrainosus: Secondary | ICD-10-CM | POA: Insufficient documentation

## 2014-11-19 DIAGNOSIS — M25572 Pain in left ankle and joints of left foot: Secondary | ICD-10-CM | POA: Diagnosis present

## 2014-11-19 DIAGNOSIS — G629 Polyneuropathy, unspecified: Secondary | ICD-10-CM

## 2014-11-19 DIAGNOSIS — M533 Sacrococcygeal disorders, not elsewhere classified: Secondary | ICD-10-CM

## 2014-11-19 DIAGNOSIS — F1120 Opioid dependence, uncomplicated: Secondary | ICD-10-CM

## 2014-11-19 DIAGNOSIS — Z8659 Personal history of other mental and behavioral disorders: Secondary | ICD-10-CM

## 2014-11-19 DIAGNOSIS — K219 Gastro-esophageal reflux disease without esophagitis: Secondary | ICD-10-CM

## 2014-11-19 DIAGNOSIS — F5105 Insomnia due to other mental disorder: Secondary | ICD-10-CM | POA: Insufficient documentation

## 2014-11-19 DIAGNOSIS — Z5181 Encounter for therapeutic drug level monitoring: Secondary | ICD-10-CM | POA: Insufficient documentation

## 2014-11-19 DIAGNOSIS — F419 Anxiety disorder, unspecified: Secondary | ICD-10-CM | POA: Diagnosis not present

## 2014-11-19 DIAGNOSIS — G4700 Insomnia, unspecified: Secondary | ICD-10-CM

## 2014-11-19 DIAGNOSIS — G8928 Other chronic postprocedural pain: Secondary | ICD-10-CM

## 2014-11-19 DIAGNOSIS — G608 Other hereditary and idiopathic neuropathies: Secondary | ICD-10-CM

## 2014-11-19 DIAGNOSIS — M51369 Other intervertebral disc degeneration, lumbar region without mention of lumbar back pain or lower extremity pain: Secondary | ICD-10-CM

## 2014-11-19 DIAGNOSIS — F1190 Opioid use, unspecified, uncomplicated: Secondary | ICD-10-CM | POA: Insufficient documentation

## 2014-11-19 DIAGNOSIS — Z87891 Personal history of nicotine dependence: Secondary | ICD-10-CM | POA: Insufficient documentation

## 2014-11-19 DIAGNOSIS — G894 Chronic pain syndrome: Secondary | ICD-10-CM | POA: Diagnosis not present

## 2014-11-19 DIAGNOSIS — F411 Generalized anxiety disorder: Secondary | ICD-10-CM | POA: Insufficient documentation

## 2014-11-19 DIAGNOSIS — G8929 Other chronic pain: Secondary | ICD-10-CM

## 2014-11-19 DIAGNOSIS — M5417 Radiculopathy, lumbosacral region: Secondary | ICD-10-CM

## 2014-11-19 DIAGNOSIS — Z79891 Long term (current) use of opiate analgesic: Secondary | ICD-10-CM

## 2014-11-19 HISTORY — DX: Other hereditary and idiopathic neuropathies: G60.8

## 2014-11-19 MED ORDER — TRAMADOL HCL 50 MG PO TABS: 100.0000 mg | ORAL_TABLET | Freq: Three times a day (TID) | ORAL | Status: DC | PRN

## 2014-11-19 MED ORDER — HYDROCODONE-ACETAMINOPHEN 5-325 MG PO TABS: 1.0000 | ORAL_TABLET | Freq: Every day | ORAL | Status: DC | PRN

## 2014-11-19 NOTE — Progress Notes (Signed)
Patient's Name: Heather Haley MRN: [id number] DOB: August 04, 1960 DOS: 11/19/2014  Primary Reason(s) for Visit: Encounter for Medication Management. CC: Ankle Pain   HPI:   Heather Haley is a 54 y.o. year old, female patient, who returns today as an established patient. She has ANKLE PAIN, LEFT; OTHER ENTHESOPATHY OF ANKLE AND TARSUS; FOOT PAIN, LEFT; ADVERSE DRUG REACTION; Left knee pain; Hypertension; IBS (irritable bowel syndrome); Postoperative nausea and vomiting; Left ankle instability; Left knee DJD; Depression; Headache(784.0); Morbid obesity ([place]); Hypersomnia; Acute bronchitis; Leg length inequality; Chest pressure; Other and unspecified angina pectoris; Lumbar Degenerative Disc Disease of L3-4 & L4-5; Chronic ankle pain; Encounter for therapeutic drug level monitoring; Encounter for long-term opiate analgesic use; Long-term current use of opiate analgesic; Uncomplicated opioid dependence ([place]); Opiate use; Chronic pain syndrome; Chronic constipation; Personal history of other diseases of the digestive system; Chronic pain following surgery or procedure; Other long term (current) drug therapy; Long term current use of opiate analgesic; Bilateral peripheral sensory neuropathy; Diabetic peripheral neuropathy ([place]); Generalized anxiety disorder; History of panic attacks; Insomnia; History of tobacco abuse; GERD (gastroesophageal reflux disease); Non-insulin dependent type 2 diabetes mellitus ([place]); Coccygeal pain; History of migraine; and Radicular pain of lumbosacral region on her problem list.. Her primarily concern today is the Ankle Pain   The patient returns today for follow-up of solution. The last time I saw her was on 09/17/2014. At that time, the patient received refills on her tramadol, and Norco, both of which should've lasted until 11/22/2014. She comes in today for refill on both. In addition, the patient indicates that she is having some of her pain returned. On 09/04/2014 we  performed a right-sided lumbar facet radiofrequency neurotomy under fluoroscopic guidance. This provided her with excellent relief of the pain of the low back. However, patient is having some tailbone pain and although we had scheduled her for caudal epidural steroid injections, I do not see where we have actually done that. I will go ahead and schedule her to come back in 2 weeks for her caudal epidural steroid injection #1 on the fluoroscopic guidance.  Pharmacotherapy Review: Side-effects or Adverse reactions: None reported. Effectiveness: Described as relatively effective, allowing for increase in activities of daily living (ADL). Onset of action: Within expected pharmacological parameters. Duration of action: Within normal limits for medication. Peak effect: Timing and results are as within normal expected parameters. [place] PMP: Compliant with practice rules and regulations. DST: Compliant with practice rules and regulations. Lab work: No new labs ordered by our practice. Treatment compliance: Compliant. Substance Use Disorder (SUD) Risk Level: Low Planned course of action: Continue therapy as is.  Allergies: Heather Haley is allergic to codeine; cymbalta; lyrica; morphine and related; nortripytline hcl; nsaids; and penicillins.  Meds: The patient has a current medication list which includes the following prescription(s): aspirin ec, bisacodyl, bupropion, buspirone, calcium carbonate, cetirizine, vitamin d, fluticasone, gabapentin, hydrocodone-acetaminophen, hypromellose, magnesium citrate, metformin, omeprazole, pravastatin, sumatriptan, topiramate, tramadol, trazodone, dexlansoprazole, dicyclomine, gabapentin, hydrocodone-acetaminophen, hydrocodone-acetaminophen, lubiprostone, and tramadol. Requested Prescriptions   Signed Prescriptions Disp Refills  . HYDROcodone-acetaminophen (NORCO/VICODIN) 5-325 MG tablet 30 tablet 0    Sig: Take 1 tablet by mouth daily as needed.  Marland Kitchen  HYDROcodone-acetaminophen (NORCO/VICODIN) 5-325 MG tablet 30 tablet 0    Sig: Take 1 tablet by mouth daily as needed for moderate pain.  Marland Kitchen HYDROcodone-acetaminophen (NORCO/VICODIN) 5-325 MG tablet 30 tablet 0    Sig: Take 1 tablet by mouth daily as needed for moderate pain.  . traMADol (ULTRAM) 50 MG  tablet 180 tablet 2    Sig: Take 2 tablets (100 mg total) by mouth every 8 (eight) hours as needed for severe pain.    ROS: Constitutional: Afebrile, no chills, well hydrated and well nourished Gastrointestinal: negative Musculoskeletal:negative Neurological: negative Behavioral/Psych: negative  PFSH: Medical:  Heather Haley  has a past medical history of Hypertension; IBS (irritable bowel syndrome); Postoperative nausea and vomiting; Diabetes mellitus ([place]); Left ankle instability; Headache(784.0); Left knee DJD; Depression; Frequency of urination; GERD (gastroesophageal reflux disease); High cholesterol; Neuromuscular disorder ([place]); and Anxiety. Family: family history includes Cancer in her mother; Heart disease in her father; Hypertension in her father. Surgical:  has past surgical history that includes Ankle surgery; Abdominal hysterectomy; Knee arthroscopy (1997); Total knee arthroplasty (08/30/2011); Colonoscopy (2013); Appendectomy; left heart catheterization with coronary angiogram (N/A, 01/11/2013); and Esophagogastroduodenoscopy (egd) with propofol (N/A, 10/03/2014). Tobacco:  reports that she quit smoking about 19 years ago. Her smoking use included Cigarettes. She has a 30 pack-year smoking history. She has never used smokeless tobacco. Alcohol:  reports that she does not drink alcohol. Drug:  reports that she does not use illicit drugs.  Physical Exam: Vitals:  Today's Vitals   11/19/14 1339 11/19/14 1342  BP:  115/69  Pulse: 73   Temp: 98.2 F (36.8 C)   Resp: 16   Height: 5\' 5"  (1.651 m)   Weight: 196 lb (88.905 kg)   SpO2: 98%   PainSc: 7  7   PainLoc: Ankle    Calculated BMI: Body mass index is 32.62 kg/(m^2). General appearance: alert, cooperative, distracted, no distress and moderately obese Eyes: conjunctivae/corneas clear. PERRL, EOM's intact. Fundi benign. Lungs: No evidence respiratory distress, no audible rales or ronchi and no use of accessory muscles of respiration Neck: no adenopathy, no carotid bruit, no JVD, supple, symmetrical, trachea midline and thyroid not enlarged, symmetric, no tenderness/mass/nodules Back: symmetric, no curvature. ROM normal. No CVA tenderness. Extremities: extremities normal, atraumatic, no cyanosis or edema Pulses: 2+ and symmetric Skin: Skin color, texture, turgor normal. No rashes or lesions Neurologic: Grossly normal    Assessment: Encounter Diagnosis:  Primary Diagnosis: Chronic pain syndrome [G89.4]  Plan: Parneet was seen today for ankle pain.  Diagnoses and all orders for this visit:  Chronic pain syndrome -     HYDROcodone-acetaminophen (NORCO/VICODIN) 5-325 MG tablet; Take 1 tablet by mouth daily as needed. -     HYDROcodone-acetaminophen (NORCO/VICODIN) 5-325 MG tablet; Take 1 tablet by mouth daily as needed for moderate pain. -     HYDROcodone-acetaminophen (NORCO/VICODIN) 5-325 MG tablet; Take 1 tablet by mouth daily as needed for moderate pain. -     traMADol (ULTRAM) 50 MG tablet; Take 2 tablets (100 mg total) by mouth every 8 (eight) hours as needed for severe pain.  Lumbar Degenerative Disc Disease of L3-4 & L4-5  Pain in joint, ankle and foot, left  Opiate use  Uncomplicated opioid dependence (HCC)  Long-term current use of opiate analgesic -     Drugs of abuse screen w/o alc, rtn urine-sln; Future  Encounter for long-term opiate analgesic use  Encounter for therapeutic drug level monitoring  Chronic ankle pain, left  Chronic low back pain  Facet syndrome, lumbar  Spondylosis of lumbar region without myelopathy or radiculopathy Comments: Small left L3-4 foraminal disc  bulge with no neural impingement. L4-5 tiny central disc bulge with no neural impingement.  Chronic pain following surgery or procedure  Other long term (current) drug therapy  Long term current use of opiate analgesic  Bilateral peripheral sensory neuropathy  Diabetic peripheral neuropathy (HCC)  Generalized anxiety disorder  History of panic attacks  Insomnia  History of tobacco abuse  Gastroesophageal reflux disease without esophagitis  Non-insulin dependent type 2 diabetes mellitus (HCC)  Coccygeal pain  History of migraine  Radicular pain of lumbosacral region     Patient Instructions   GENERAL RISKS AND COMPLICATIONS  What are the risk, side effects and possible complications? Generally speaking, most procedures are safe.  However, with any procedure there are risks, side effects, and the possibility of complications.  The risks and complications are dependent upon the sites that are lesioned, or the type of nerve block to be performed.  The closer the procedure is to the spine, the more serious the risks are.  Great care is taken when placing the radio frequency needles, block needles or lesioning probes, but sometimes complications can occur. 1. Infection: Any time there is an injection through the skin, there is a risk of infection.  This is why sterile conditions are used for these blocks.  There are four possible types of infection. 1. Localized skin infection. 2. Central Nervous System Infection-This can be in the form of Meningitis, which can be deadly. 3. Epidural Infections-This can be in the form of an epidural abscess, which can cause pressure inside of the spine, causing compression of the spinal cord with subsequent paralysis. This would require an emergency surgery to decompress, and there are no guarantees that the patient would recover from the paralysis. 4. Discitis-This is an infection of the intervertebral discs.  It occurs in about 1% of discography  procedures.  It is difficult to treat and it may lead to surgery.        2. Pain: the needles have to go through skin and soft tissues, will cause soreness.       3. Damage to internal structures:  The nerves to be lesioned may be near blood vessels or    other nerves which can be potentially damaged.       4. Bleeding: Bleeding is more common if the patient is taking blood thinners such as  aspirin, Coumadin, Ticiid, Plavix, etc., or if he/she have some genetic predisposition  such as hemophilia. Bleeding into the spinal canal can cause compression of the spinal  cord with subsequent paralysis.  This would require an emergency surgery to  decompress and there are no guarantees that the patient would recover from the  paralysis.       5. Pneumothorax:  Puncturing of a lung is a possibility, every time a needle is introduced in  the area of the chest or upper back.  Pneumothorax refers to free air around the  collapsed lung(s), inside of the thoracic cavity (chest cavity).  Another two possible  complications related to a similar event would include: Hemothorax and Chylothorax.   These are variations of the Pneumothorax, where instead of air around the collapsed  lung(s), you may have blood or chyle, respectively.       6. Spinal headaches: They may occur with any procedures in the area of the spine.       7. Persistent CSF (Cerebro-Spinal Fluid) leakage: This is a rare problem, but may occur  with prolonged intrathecal or epidural catheters either due to the formation of a fistulous  track or a dural tear.       8. Nerve damage: By working so close to the spinal cord, there is always a possibility of  nerve  damage, which could be as serious as a permanent spinal cord injury with  paralysis.       9. Death:  Although rare, severe deadly allergic reactions known as "Anaphylactic  reaction" can occur to any of the medications used.      10. Worsening of the symptoms:  We can always make thing worse.  What  are the chances of something like this happening? Chances of any of this occuring are extremely low.  By statistics, you have more of a chance of getting killed in a motor vehicle accident: while driving to the hospital than any of the above occurring .  Nevertheless, you should be aware that they are possibilities.  In general, it is similar to taking a shower.  Everybody knows that you can slip, hit your head and get killed.  Does that mean that you should not shower again?  Nevertheless always keep in mind that statistics do not mean anything if you happen to be on the wrong side of them.  Even if a procedure has a 1 (one) in a 1,000,000 (million) chance of going wrong, it you happen to be that one..Also, keep in mind that by statistics, you have more of a chance of having something go wrong when taking medications.  Who should not have this procedure? If you are on a blood thinning medication (e.g. Coumadin, Plavix, see list of "Blood Thinners"), or if you have an active infection going on, you should not have the procedure.  If you are taking any blood thinners, please inform your physician.  How should I prepare for this procedure?  Do not eat or drink anything at least six hours prior to the procedure.  Bring a driver with you .  It cannot be a taxi.  Come accompanied by an adult that can drive you back, and that is strong enough to help you if your legs get weak or numb from the local anesthetic.  Take all of your medicines the morning of the procedure with just enough water to swallow them.  If you have diabetes, make sure that you are scheduled to have your procedure done first thing in the morning, whenever possible.  If you have diabetes, take only half of your insulin dose and notify our nurse that you have done so as soon as you arrive at the clinic.  If you are diabetic, but only take blood sugar pills (oral hypoglycemic), then do not take them on the morning of your procedure.   You may take them after you have had the procedure.  Do not take aspirin or any aspirin-containing medications, at least eleven (11) days prior to the procedure.  They may prolong bleeding.  Wear loose fitting clothing that may be easy to take off and that you would not mind if it got stained with Betadine or blood.  Do not wear any jewelry or perfume  Remove any nail coloring.  It will interfere with some of our monitoring equipment.  NOTE: Remember that this is not meant to be interpreted as a complete list of all possible complications.  Unforeseen problems may occur.  BLOOD THINNERS The following drugs contain aspirin or other products, which can cause increased bleeding during surgery and should not be taken for 2 weeks prior to and 1 week after surgery.  If you should need take something for relief of minor pain, you may take acetaminophen which is found in Tylenol,m Datril, Anacin-3 and Panadol. It is not blood thinner.  The products listed below are.  Do not take any of the products listed below in addition to any listed on your instruction sheet.  A.P.C or A.P.C with Codeine Codeine Phosphate Capsules #3 Ibuprofen Ridaura  ABC compound Congesprin Imuran rimadil  Advil Cope Indocin Robaxisal  Alka-Seltzer Effervescent Pain Reliever and Antacid Coricidin or Coricidin-D  Indomethacin Rufen  Alka-Seltzer plus Cold Medicine Cosprin Ketoprofen S-A-C Tablets  Anacin Analgesic Tablets or Capsules Coumadin Korlgesic Salflex  Anacin Extra Strength Analgesic tablets or capsules CP-2 Tablets Lanoril Salicylate  Anaprox Cuprimine Capsules Levenox Salocol  Anexsia-D Dalteparin Magan Salsalate  Anodynos Darvon compound Magnesium Salicylate Sine-off  Ansaid Dasin Capsules Magsal Sodium Salicylate  Anturane Depen Capsules Marnal Soma  APF Arthritis pain formula Dewitt's Pills Measurin Stanback  Argesic Dia-Gesic Meclofenamic Sulfinpyrazone  Arthritis Bayer Timed Release Aspirin Diclofenac  Meclomen Sulindac  Arthritis pain formula Anacin Dicumarol Medipren Supac  Analgesic (Safety coated) Arthralgen Diffunasal Mefanamic Suprofen  Arthritis Strength Bufferin Dihydrocodeine Mepro Compound Suprol  Arthropan liquid Dopirydamole Methcarbomol with Aspirin Synalgos  ASA tablets/Enseals Disalcid Micrainin Tagament  Ascriptin Doan's Midol Talwin  Ascriptin A/D Dolene Mobidin Tanderil  Ascriptin Extra Strength Dolobid Moblgesic Ticlid  Ascriptin with Codeine Doloprin or Doloprin with Codeine Momentum Tolectin  Asperbuf Duoprin Mono-gesic Trendar  Aspergum Duradyne Motrin or Motrin IB Triminicin  Aspirin plain, buffered or enteric coated Durasal Myochrisine Trigesic  Aspirin Suppositories Easprin Nalfon Trillsate  Aspirin with Codeine Ecotrin Regular or Extra Strength Naprosyn Uracel  Atromid-S Efficin Naproxen Ursinus  Auranofin Capsules Elmiron Neocylate Vanquish  Axotal Emagrin Norgesic Verin  Azathioprine Empirin or Empirin with Codeine Normiflo Vitamin E  Azolid Emprazil Nuprin Voltaren  Bayer Aspirin plain, buffered or children's or timed BC Tablets or powders Encaprin Orgaran Warfarin Sodium  Buff-a-Comp Enoxaparin Orudis Zorpin  Buff-a-Comp with Codeine Equegesic Os-Cal-Gesic   Buffaprin Excedrin plain, buffered or Extra Strength Oxalid   Bufferin Arthritis Strength Feldene Oxphenbutazone   Bufferin plain or Extra Strength Feldene Capsules Oxycodone with Aspirin   Bufferin with Codeine Fenoprofen Fenoprofen Pabalate or Pabalate-SF   Buffets II Flogesic Panagesic   Buffinol plain or Extra Strength Florinal or Florinal with Codeine Panwarfarin   Buf-Tabs Flurbiprofen Penicillamine   Butalbital Compound Four-way cold tablets Penicillin   Butazolidin Fragmin Pepto-Bismol   Carbenicillin Geminisyn Percodan   Carna Arthritis Reliever Geopen Persantine   Carprofen Gold's salt Persistin   Chloramphenicol Goody's Phenylbutazone   Chloromycetin Haltrain Piroxlcam   Clmetidine  heparin Plaquenil   Cllnoril Hyco-pap Ponstel   Clofibrate Hydroxy chloroquine Propoxyphen         Before stopping any of these medications, be sure to consult the physician who ordered them.  Some, such as Coumadin (Warfarin) are ordered to prevent or treat serious conditions such as "deep thrombosis", "pumonary embolisms", and other heart problems.  The amount of time that you may need off of the medication may also vary with the medication and the reason for which you were taking it.  If you are taking any of these medications, please make sure you notify your pain physician before you undergo any procedures.         Epidural Steroid Injection Patient Information  Description: The epidural space surrounds the nerves as they exit the spinal cord.  In some patients, the nerves can be compressed and inflamed by a bulging disc or a tight spinal canal (spinal stenosis).  By injecting steroids into the epidural space, we can bring irritated nerves into direct contact with a potentially helpful medication.  These steroids act directly on the irritated nerves and can reduce swelling and inflammation which often leads to decreased pain.  Epidural steroids may be injected anywhere along the spine and from the neck to the low back depending upon the location of your pain.   After numbing the skin with local anesthetic (like Novocaine), a small needle is passed into the epidural space slowly.  You may experience a sensation of pressure while this is being done.  The entire block usually last less than 10 minutes.  Conditions which may be treated by epidural steroids:   Low back and leg pain  Neck and arm pain  Spinal stenosis  Post-laminectomy syndrome  Herpes zoster (shingles) pain  Pain from compression fractures  Preparation for the injection:  1. Do not eat any solid food or dairy products within 6 hours of your appointment.  2. You may drink clear liquids up to 2 hours before  appointment.  Clear liquids include water, black coffee, juice or soda.  No milk or cream please. 3. You may take your regular medication, including pain medications, with a sip of water before your appointment  Diabetics should hold regular insulin (if taken separately) and take 1/2 normal NPH dos the morning of the procedure.  Carry some sugar containing items with you to your appointment. 4. A driver must accompany you and be prepared to drive you home after your procedure.  5. Bring all your current medications with your. 6. An IV may be inserted and sedation may be given at the discretion of the physician.   7. A blood pressure cuff, EKG and other monitors will often be applied during the procedure.  Some patients may need to have extra oxygen administered for a short period. 8. You will be asked to provide medical information, including your allergies, prior to the procedure.  We must know immediately if you are taking blood thinners (like Coumadin/Warfarin)  Or if you are allergic to IV iodine contrast (dye). We must know if you could possible be pregnant.  Possible side-effects:  Bleeding from needle site  Infection (rare, may require surgery)  Nerve injury (rare)  Numbness & tingling (temporary)  Difficulty urinating (rare, temporary)  Spinal headache ( a headache worse with upright posture)  Light -headedness (temporary)  Pain at injection site (several days)  Decreased blood pressure (temporary)  Weakness in arm/leg (temporary)  Pressure sensation in back/neck (temporary)  Call if you experience:  Fever/chills associated with headache or increased back/neck pain.  Headache worsened by an upright position.  New onset weakness or numbness of an extremity below the injection site  Hives or difficulty breathing (go to the emergency room)  Inflammation or drainage at the infection site  Severe back/neck pain  Any new symptoms which are concerning to you  Please  note:  Although the local anesthetic injected can often make your back or neck feel good for several hours after the injection, the pain will likely return.  It takes 3-7 days for steroids to work in the epidural space.  You may not notice any pain relief for at least that one week.  If effective, we will often do a series of three injections spaced 3-6 weeks apart to maximally decrease your pain.  After the initial series, we generally will wait several months before considering a repeat injection of the same type.  If you have any questions, please call [phone] [facility]   Medications discontinued today:  Medications Discontinued During This Encounter  Medication Reason  . HYDROcodone-acetaminophen (NORCO/VICODIN) 5-325 MG per tablet Reorder   Medications administered today:  Ms. Cleland had no medications administered during this visit.  Primary Care Physician: Shamleffer, Herschell Dimes, MD Location: [facility] Note by: Kathlen Brunswick. Dossie Arbour, M.D, DABA, DABAPM, DABPM, DABIPP, FIPP

## 2014-11-19 NOTE — Patient Instructions (Signed)
GENERAL RISKS AND COMPLICATIONS  What are the risk, side effects and possible complications? Generally speaking, most procedures are safe.  However, with any procedure there are risks, side effects, and the possibility of complications.  The risks and complications are dependent upon the sites that are lesioned, or the type of nerve block to be performed.  The closer the procedure is to the spine, the more serious the risks are.  Great care is taken when placing the radio frequency needles, block needles or lesioning probes, but sometimes complications can occur. 1. Infection: Any time there is an injection through the skin, there is a risk of infection.  This is why sterile conditions are used for these blocks.  There are four possible types of infection. 1. Localized skin infection. 2. Central Nervous System Infection-This can be in the form of Meningitis, which can be deadly. 3. Epidural Infections-This can be in the form of an epidural abscess, which can cause pressure inside of the spine, causing compression of the spinal cord with subsequent paralysis. This would require an emergency surgery to decompress, and there are no guarantees that the patient would recover from the paralysis. 4. Discitis-This is an infection of the intervertebral discs.  It occurs in about 1% of discography procedures.  It is difficult to treat and it may lead to surgery.        2. Pain: the needles have to go through skin and soft tissues, will cause soreness.       3. Damage to internal structures:  The nerves to be lesioned may be near blood vessels or    other nerves which can be potentially damaged.       4. Bleeding: Bleeding is more common if the patient is taking blood thinners such as  aspirin, Coumadin, Ticiid, Plavix, etc., or if he/she have some genetic predisposition  such as hemophilia. Bleeding into the spinal canal can cause compression of the spinal  cord with subsequent paralysis.  This would require an  emergency surgery to  decompress and there are no guarantees that the patient would recover from the  paralysis.       5. Pneumothorax:  Puncturing of a lung is a possibility, every time a needle is introduced in  the area of the chest or upper back.  Pneumothorax refers to free air around the  collapsed lung(s), inside of the thoracic cavity (chest cavity).  Another two possible  complications related to a similar event would include: Hemothorax and Chylothorax.   These are variations of the Pneumothorax, where instead of air around the collapsed  lung(s), you may have blood or chyle, respectively.       6. Spinal headaches: They may occur with any procedures in the area of the spine.       7. Persistent CSF (Cerebro-Spinal Fluid) leakage: This is a rare problem, but may occur  with prolonged intrathecal or epidural catheters either due to the formation of a fistulous  track or a dural tear.       8. Nerve damage: By working so close to the spinal cord, there is always a possibility of  nerve damage, which could be as serious as a permanent spinal cord injury with  paralysis.       9. Death:  Although rare, severe deadly allergic reactions known as "Anaphylactic  reaction" can occur to any of the medications used.      10. Worsening of the symptoms:  We can always make thing worse.    What are the chances of something like this happening? Chances of any of this occuring are extremely low.  By statistics, you have more of a chance of getting killed in a motor vehicle accident: while driving to the hospital than any of the above occurring .  Nevertheless, you should be aware that they are possibilities.  In general, it is similar to taking a shower.  Everybody knows that you can slip, hit your head and get killed.  Does that mean that you should not shower again?  Nevertheless always keep in mind that statistics do not mean anything if you happen to be on the wrong side of them.  Even if a procedure has a 1  (one) in a 1,000,000 (million) chance of going wrong, it you happen to be that one..Also, keep in mind that by statistics, you have more of a chance of having something go wrong when taking medications.  Who should not have this procedure? If you are on a blood thinning medication (e.g. Coumadin, Plavix, see list of "Blood Thinners"), or if you have an active infection going on, you should not have the procedure.  If you are taking any blood thinners, please inform your physician.  How should I prepare for this procedure?  Do not eat or drink anything at least six hours prior to the procedure.  Bring a driver with you .  It cannot be a taxi.  Come accompanied by an adult that can drive you back, and that is strong enough to help you if your legs get weak or numb from the local anesthetic.  Take all of your medicines the morning of the procedure with just enough water to swallow them.  If you have diabetes, make sure that you are scheduled to have your procedure done first thing in the morning, whenever possible.  If you have diabetes, take only half of your insulin dose and notify our nurse that you have done so as soon as you arrive at the clinic.  If you are diabetic, but only take blood sugar pills (oral hypoglycemic), then do not take them on the morning of your procedure.  You may take them after you have had the procedure.  Do not take aspirin or any aspirin-containing medications, at least eleven (11) days prior to the procedure.  They may prolong bleeding.  Wear loose fitting clothing that may be easy to take off and that you would not mind if it got stained with Betadine or blood.  Do not wear any jewelry or perfume  Remove any nail coloring.  It will interfere with some of our monitoring equipment.  NOTE: Remember that this is not meant to be interpreted as a complete list of all possible complications.  Unforeseen problems may occur.  BLOOD THINNERS The following drugs  contain aspirin or other products, which can cause increased bleeding during surgery and should not be taken for 2 weeks prior to and 1 week after surgery.  If you should need take something for relief of minor pain, you may take acetaminophen which is found in Tylenol,m Datril, Anacin-3 and Panadol. It is not blood thinner. The products listed below are.  Do not take any of the products listed below in addition to any listed on your instruction sheet.  A.P.C or A.P.C with Codeine Codeine Phosphate Capsules #3 Ibuprofen Ridaura  ABC compound Congesprin Imuran rimadil  Advil Cope Indocin Robaxisal  Alka-Seltzer Effervescent Pain Reliever and Antacid Coricidin or Coricidin-D  Indomethacin Rufen    Alka-Seltzer plus Cold Medicine Cosprin Ketoprofen S-A-C Tablets  Anacin Analgesic Tablets or Capsules Coumadin Korlgesic Salflex  Anacin Extra Strength Analgesic tablets or capsules CP-2 Tablets Lanoril Salicylate  Anaprox Cuprimine Capsules Levenox Salocol  Anexsia-D Dalteparin Magan Salsalate  Anodynos Darvon compound Magnesium Salicylate Sine-off  Ansaid Dasin Capsules Magsal Sodium Salicylate  Anturane Depen Capsules Marnal Soma  APF Arthritis pain formula Dewitt's Pills Measurin Stanback  Argesic Dia-Gesic Meclofenamic Sulfinpyrazone  Arthritis Bayer Timed Release Aspirin Diclofenac Meclomen Sulindac  Arthritis pain formula Anacin Dicumarol Medipren Supac  Analgesic (Safety coated) Arthralgen Diffunasal Mefanamic Suprofen  Arthritis Strength Bufferin Dihydrocodeine Mepro Compound Suprol  Arthropan liquid Dopirydamole Methcarbomol with Aspirin Synalgos  ASA tablets/Enseals Disalcid Micrainin Tagament  Ascriptin Doan's Midol Talwin  Ascriptin A/D Dolene Mobidin Tanderil  Ascriptin Extra Strength Dolobid Moblgesic Ticlid  Ascriptin with Codeine Doloprin or Doloprin with Codeine Momentum Tolectin  Asperbuf Duoprin Mono-gesic Trendar  Aspergum Duradyne Motrin or Motrin IB Triminicin  Aspirin  plain, buffered or enteric coated Durasal Myochrisine Trigesic  Aspirin Suppositories Easprin Nalfon Trillsate  Aspirin with Codeine Ecotrin Regular or Extra Strength Naprosyn Uracel  Atromid-S Efficin Naproxen Ursinus  Auranofin Capsules Elmiron Neocylate Vanquish  Axotal Emagrin Norgesic Verin  Azathioprine Empirin or Empirin with Codeine Normiflo Vitamin E  Azolid Emprazil Nuprin Voltaren  Bayer Aspirin plain, buffered or children's or timed BC Tablets or powders Encaprin Orgaran Warfarin Sodium  Buff-a-Comp Enoxaparin Orudis Zorpin  Buff-a-Comp with Codeine Equegesic Os-Cal-Gesic   Buffaprin Excedrin plain, buffered or Extra Strength Oxalid   Bufferin Arthritis Strength Feldene Oxphenbutazone   Bufferin plain or Extra Strength Feldene Capsules Oxycodone with Aspirin   Bufferin with Codeine Fenoprofen Fenoprofen Pabalate or Pabalate-SF   Buffets II Flogesic Panagesic   Buffinol plain or Extra Strength Florinal or Florinal with Codeine Panwarfarin   Buf-Tabs Flurbiprofen Penicillamine   Butalbital Compound Four-way cold tablets Penicillin   Butazolidin Fragmin Pepto-Bismol   Carbenicillin Geminisyn Percodan   Carna Arthritis Reliever Geopen Persantine   Carprofen Gold's salt Persistin   Chloramphenicol Goody's Phenylbutazone   Chloromycetin Haltrain Piroxlcam   Clmetidine heparin Plaquenil   Cllnoril Hyco-pap Ponstel   Clofibrate Hydroxy chloroquine Propoxyphen         Before stopping any of these medications, be sure to consult the physician who ordered them.  Some, such as Coumadin (Warfarin) are ordered to prevent or treat serious conditions such as "deep thrombosis", "pumonary embolisms", and other heart problems.  The amount of time that you may need off of the medication may also vary with the medication and the reason for which you were taking it.  If you are taking any of these medications, please make sure you notify your pain physician before you undergo any  procedures.         Epidural Steroid Injection Patient Information  Description: The epidural space surrounds the nerves as they exit the spinal cord.  In some patients, the nerves can be compressed and inflamed by a bulging disc or a tight spinal canal (spinal stenosis).  By injecting steroids into the epidural space, we can bring irritated nerves into direct contact with a potentially helpful medication.  These steroids act directly on the irritated nerves and can reduce swelling and inflammation which often leads to decreased pain.  Epidural steroids may be injected anywhere along the spine and from the neck to the low back depending upon the location of your pain.   After numbing the skin with local anesthetic (like Novocaine), a small needle is passed   into the epidural space slowly.  You may experience a sensation of pressure while this is being done.  The entire block usually last less than 10 minutes.  Conditions which may be treated by epidural steroids:   Low back and leg pain  Neck and arm pain  Spinal stenosis  Post-laminectomy syndrome  Herpes zoster (shingles) pain  Pain from compression fractures  Preparation for the injection:  1. Do not eat any solid food or dairy products within 6 hours of your appointment.  2. You may drink clear liquids up to 2 hours before appointment.  Clear liquids include water, black coffee, juice or soda.  No milk or cream please. 3. You may take your regular medication, including pain medications, with a sip of water before your appointment  Diabetics should hold regular insulin (if taken separately) and take 1/2 normal NPH dos the morning of the procedure.  Carry some sugar containing items with you to your appointment. 4. A driver must accompany you and be prepared to drive you home after your procedure.  5. Bring all your current medications with your. 6. An IV may be inserted and sedation may be given at the discretion of the  physician.   7. A blood pressure cuff, EKG and other monitors will often be applied during the procedure.  Some patients may need to have extra oxygen administered for a short period. 8. You will be asked to provide medical information, including your allergies, prior to the procedure.  We must know immediately if you are taking blood thinners (like Coumadin/Warfarin)  Or if you are allergic to IV iodine contrast (dye). We must know if you could possible be pregnant.  Possible side-effects:  Bleeding from needle site  Infection (rare, may require surgery)  Nerve injury (rare)  Numbness & tingling (temporary)  Difficulty urinating (rare, temporary)  Spinal headache ( a headache worse with upright posture)  Light -headedness (temporary)  Pain at injection site (several days)  Decreased blood pressure (temporary)  Weakness in arm/leg (temporary)  Pressure sensation in back/neck (temporary)  Call if you experience:  Fever/chills associated with headache or increased back/neck pain.  Headache worsened by an upright position.  New onset weakness or numbness of an extremity below the injection site  Hives or difficulty breathing (go to the emergency room)  Inflammation or drainage at the infection site  Severe back/neck pain  Any new symptoms which are concerning to you  Please note:  Although the local anesthetic injected can often make your back or neck feel good for several hours after the injection, the pain will likely return.  It takes 3-7 days for steroids to work in the epidural space.  You may not notice any pain relief for at least that one week.  If effective, we will often do a series of three injections spaced 3-6 weeks apart to maximally decrease your pain.  After the initial series, we generally will wait several months before considering a repeat injection of the same type.  If you have any questions, please call [phone] [facility] 

## 2014-11-19 NOTE — Progress Notes (Signed)
Tramadol pill count #92 Hydrocodone pill count # 9 Disposed of Oxycodone  5/325 mg #74 wasted in toilet with D. Donneta Romberg [employer]

## 2014-11-19 NOTE — Progress Notes (Signed)
Safety precautions to be maintained throughout the outpatient stay will include: orient to surroundings, keep bed in low position, maintain call bell within reach at all times, provide assistance with transfer out of bed and ambulation.  

## 2014-11-26 ENCOUNTER — Encounter: Payer: Self-pay | Admitting: Pain Medicine

## 2014-11-26 ENCOUNTER — Ambulatory Visit: Payer: Worker's Compensation | Attending: Pain Medicine | Admitting: Pain Medicine

## 2014-11-26 VITALS — BP 106/82 | HR 85 | Resp 18 | Ht 65.0 in | Wt 196.0 lb

## 2014-11-26 DIAGNOSIS — F329 Major depressive disorder, single episode, unspecified: Secondary | ICD-10-CM | POA: Insufficient documentation

## 2014-11-26 DIAGNOSIS — Z87891 Personal history of nicotine dependence: Secondary | ICD-10-CM | POA: Diagnosis not present

## 2014-11-26 DIAGNOSIS — T887XXA Unspecified adverse effect of drug or medicament, initial encounter: Secondary | ICD-10-CM | POA: Diagnosis not present

## 2014-11-26 DIAGNOSIS — K589 Irritable bowel syndrome without diarrhea: Secondary | ICD-10-CM | POA: Insufficient documentation

## 2014-11-26 DIAGNOSIS — R220 Localized swelling, mass and lump, head: Secondary | ICD-10-CM | POA: Insufficient documentation

## 2014-11-26 DIAGNOSIS — F418 Other specified anxiety disorders: Secondary | ICD-10-CM | POA: Diagnosis not present

## 2014-11-26 DIAGNOSIS — M79672 Pain in left foot: Secondary | ICD-10-CM | POA: Insufficient documentation

## 2014-11-26 DIAGNOSIS — M5136 Other intervertebral disc degeneration, lumbar region: Secondary | ICD-10-CM | POA: Insufficient documentation

## 2014-11-26 DIAGNOSIS — I10 Essential (primary) hypertension: Secondary | ICD-10-CM | POA: Diagnosis not present

## 2014-11-26 DIAGNOSIS — Z5181 Encounter for therapeutic drug level monitoring: Secondary | ICD-10-CM

## 2014-11-26 DIAGNOSIS — E119 Type 2 diabetes mellitus without complications: Secondary | ICD-10-CM | POA: Insufficient documentation

## 2014-11-26 DIAGNOSIS — T50905A Adverse effect of unspecified drugs, medicaments and biological substances, initial encounter: Secondary | ICD-10-CM

## 2014-11-26 DIAGNOSIS — K219 Gastro-esophageal reflux disease without esophagitis: Secondary | ICD-10-CM | POA: Diagnosis not present

## 2014-11-26 DIAGNOSIS — Z79899 Other long term (current) drug therapy: Secondary | ICD-10-CM | POA: Insufficient documentation

## 2014-11-26 NOTE — Progress Notes (Signed)
Patient's Name: Heather Haley MRN: [id number] DOB: 1960/07/01 DOS: 11/26/2014  Primary Reason(s) for Visit: Encounter for Medical Counseling. CC: Foot Pain   HPI:   Heather Haley is a 54 y.o. year old, female patient, who returns today as an established patient. She has ANKLE PAIN, LEFT; OTHER ENTHESOPATHY OF ANKLE AND TARSUS; FOOT PAIN, LEFT; ADVERSE DRUG REACTION; Left knee pain; Hypertension; IBS (irritable bowel syndrome); Postoperative nausea and vomiting; Left ankle instability; Left knee DJD; Depression; Headache(784.0); Morbid obesity ([place]); Hypersomnia; Acute bronchitis; Leg length inequality; Chest pressure; Other and unspecified angina pectoris; Lumbar Degenerative Disc Disease of L3-4 & L4-5; Chronic ankle pain; Encounter for therapeutic drug level monitoring; Encounter for long-term opiate analgesic use; Long-term current use of opiate analgesic; Uncomplicated opioid dependence ([place]); Opiate use; Chronic pain syndrome; Chronic constipation; Personal history of other diseases of the digestive system; Chronic pain following surgery or procedure; Other long term (current) drug therapy; Long term current use of opiate analgesic; Bilateral peripheral sensory neuropathy; Diabetic peripheral neuropathy ([place]); Generalized anxiety disorder; History of panic attacks; Insomnia; History of tobacco abuse; GERD (gastroesophageal reflux disease); Non-insulin dependent type 2 diabetes mellitus ([place]); Coccygeal pain; History of migraine; and Radicular pain of lumbosacral region on her problem list.. Her primarily concern today is the Foot Pain     Pharmacotherapy Review: Side-effects or Adverse reactions: Moderate. The patient indicates that she was given generic Norco and since then she started having some swelling of the top of her tongue , numbness, and some occasional swelling of her feet. This is rather hard since we did not change the dose of any of her medications. In further questioning the  patient, it turns out that she recently had her gabapentin increased from 100 milligrams 3 times a day to 300 mg. I have recommended to the patient that she go back and talk to her primary care physician about this since discussion of side effects have been described with the gabapentin. Effectiveness: Described as relatively effective, allowing for increase in activities of daily living (ADL). Onset of action: Within expected pharmacological parameters. Duration of action: Within normal limits for medication. Peak effect: Timing and results are as within normal expected parameters. [place] PMP: Compliant with practice rules and regulations. DST: Compliant with practice rules and regulations. Lab work: No new labs ordered by our practice. Treatment compliance: Compliant. Substance Use Disorder (SUD) Risk Level: Low Planned course of action: Continue therapy as is.  Allergies: Heather Haley is allergic to codeine; cymbalta; lyrica; morphine and related; nortripytline hcl; nsaids; and penicillins.  Meds: The patient has a current medication list which includes the following prescription(s): aspirin ec, bisacodyl, bupropion, buspirone, calcium carbonate, cetirizine, vitamin d, dexlansoprazole, dicyclomine, fluticasone, gabapentin, gabapentin, hydrocodone-acetaminophen, hydrocodone-acetaminophen, hydrocodone-acetaminophen, hypromellose, lubiprostone, magnesium citrate, metformin, omeprazole, pravastatin, sumatriptan, topiramate, tramadol, tramadol, and trazodone. Requested Prescriptions    No prescriptions requested or ordered in this encounter    ROS: Constitutional: Afebrile, no chills, well hydrated and well nourished Gastrointestinal: negative Musculoskeletal:negative Neurological: negative Behavioral/Psych: negative  PFSH: Medical:  Heather Haley  has a past medical history of Hypertension; IBS (irritable bowel syndrome); Postoperative nausea and vomiting; Diabetes mellitus ([place]); Left  ankle instability; Headache(784.0); Left knee DJD; Depression; Frequency of urination; GERD (gastroesophageal reflux disease); High cholesterol; Neuromuscular disorder ([place]); and Anxiety. Family: family history includes Cancer in her mother; Heart disease in her father; Hypertension in her father. Surgical:  has past surgical history that includes Ankle surgery; Abdominal hysterectomy; Knee arthroscopy (1997); Total knee arthroplasty (08/30/2011); Colonoscopy (2013); Appendectomy;  left heart catheterization with coronary angiogram (N/A, 01/11/2013); and Esophagogastroduodenoscopy (egd) with propofol (N/A, 10/03/2014). Tobacco:  reports that she quit smoking about 19 years ago. Her smoking use included Cigarettes. She has a 30 pack-year smoking history. She has never used smokeless tobacco. Alcohol:  reports that she does not drink alcohol. Drug:  reports that she does not use illicit drugs.  Physical Exam: Vitals:  Today's Vitals   11/26/14 1436 11/26/14 1440  BP: 106/82   Pulse: 85   Resp: 18   Height: 5\' 5"  (1.651 m)   Weight: 196 lb (88.905 kg)   SpO2: 99%   PainSc: 7  7   PainLoc: Foot   Calculated BMI: Body mass index is 32.62 kg/(m^2). General appearance: alert, cooperative, appears stated age, no distress and moderately obese. I closely examined the patient's tongue, and red and swollen, she has some small bumps that have come up. Eyes: conjunctivae/corneas clear. PERRL, EOM's intact. Fundi benign. Lungs: No evidence respiratory distress, no audible rales or ronchi and no use of accessory muscles of respiration Neck: no adenopathy, no carotid bruit, no JVD, supple, symmetrical, trachea midline and thyroid not enlarged, symmetric, no tenderness/mass/nodules Back: symmetric, no curvature. ROM normal. No CVA tenderness. Extremities: extremities normal, atraumatic, no cyanosis or edema Pulses: 2+ and symmetric Skin: Skin color, texture, turgor normal. No rashes or lesions Neurologic:  Grossly normal    Assessment: Encounter Diagnosis:  Primary Diagnosis: Mild tongue swelling [R22.0]  Plan: [name] was seen today for foot pain.  Diagnoses and all orders for this visit:  Mild tongue swelling  Encounter for therapeutic drug monitoring  Medication side effect, initial encounter     There are no Patient Instructions on file for this visit. Medications discontinued today:  There are no discontinued medications. Medications administered today:  Ms. Isais does not currently have medications on file.  Primary Care Physician: Shamleffer, Herschell Dimes, MD Location: [facility] Note by: Kathlen Brunswick. Dossie Arbour, M.D, DABA, DABAPM, DABPM, DABIPP, FIPP

## 2014-11-26 NOTE — Progress Notes (Signed)
Safety precautions to be maintained throughout the outpatient stay will include: orient to surroundings, keep bed in low position, maintain call bell within reach at all times, provide assistance with transfer out of bed and ambulation. Hydrocodone pill count #6/30

## 2014-11-27 ENCOUNTER — Encounter: Payer: Self-pay | Admitting: Nurse Practitioner

## 2014-11-27 ENCOUNTER — Encounter: Payer: Self-pay | Admitting: Pain Medicine

## 2014-11-29 ENCOUNTER — Telehealth: Payer: Self-pay | Admitting: *Deleted

## 2014-11-29 NOTE — Telephone Encounter (Signed)
S/w pt to let pt know I do not see a cancelled appointment in our system.  As far as results go on monitor stated was to early to see results was just dropped off yesterday.  Stated as soon as we get results will call pt and let know results and next plan of action.  Will route to [employer] to [place].

## 2014-12-02 ENCOUNTER — Encounter: Payer: Self-pay | Admitting: Pain Medicine

## 2014-12-02 ENCOUNTER — Telehealth: Payer: Self-pay

## 2014-12-02 NOTE — Telephone Encounter (Signed)
Pt would not give me a reason as to why she wanted to talk to you personally... I told pt that we are here to help you care for her and our other pts Patient still refused and she wants you to call her personally

## 2014-12-03 ENCOUNTER — Ambulatory Visit: Payer: Medicare Other | Admitting: Pain Medicine

## 2014-12-03 IMAGING — US US EXTREM LOW VENOUS BILAT
1 series · 14 of 24 positions shown · non-contrast
Comparison: 10/06/2011

CLINICAL DATA: Bilateral leg pain and swelling.  History of DVT.
Diabetes.

BILATERAL LOWER EXTREMITY VENOUS DOPPLER ULTRASOUND
TECHNIQUE: Gray-scale sonography with compression, as well as color
and duplex ultrasound, were performed to evaluate the deep venous
system from the level of the common femoral vein through the
popliteal and proximal calf veins.

[Series 1: us extrem low venous bilat · 14 of 62 slices shown]
[im 1/62]
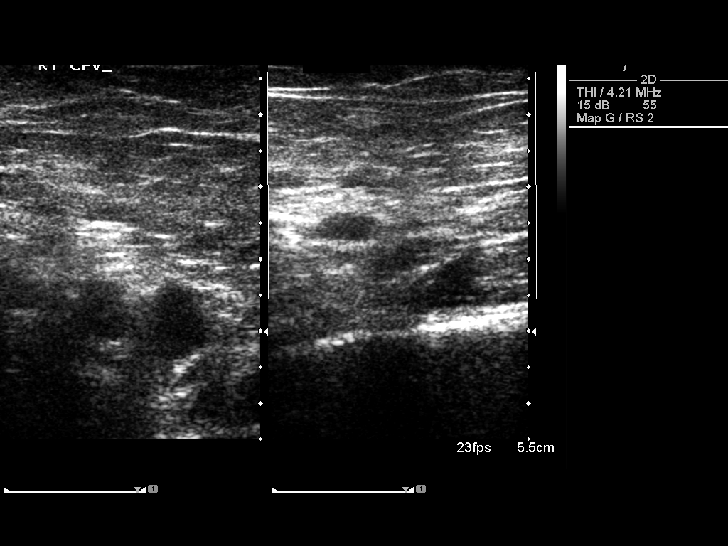
[im 6/62]
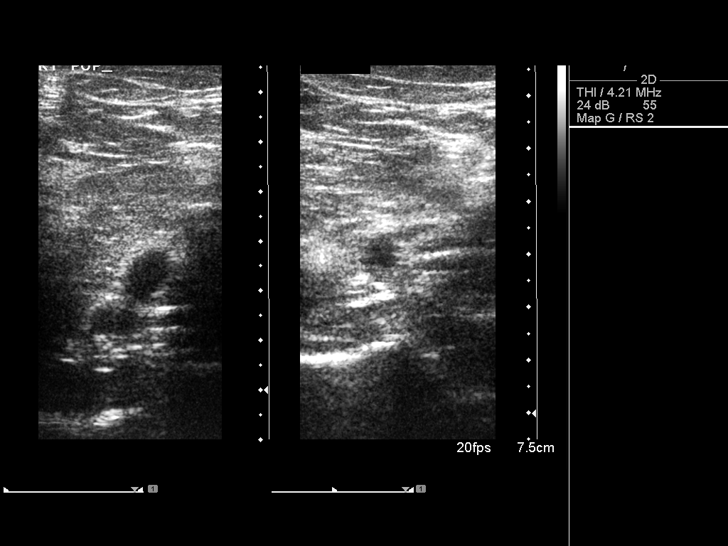
[im 11/62]
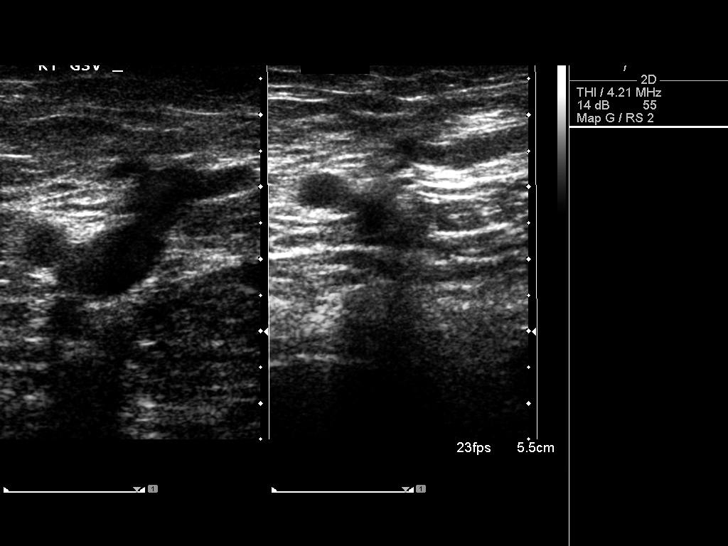
[im 16/62]
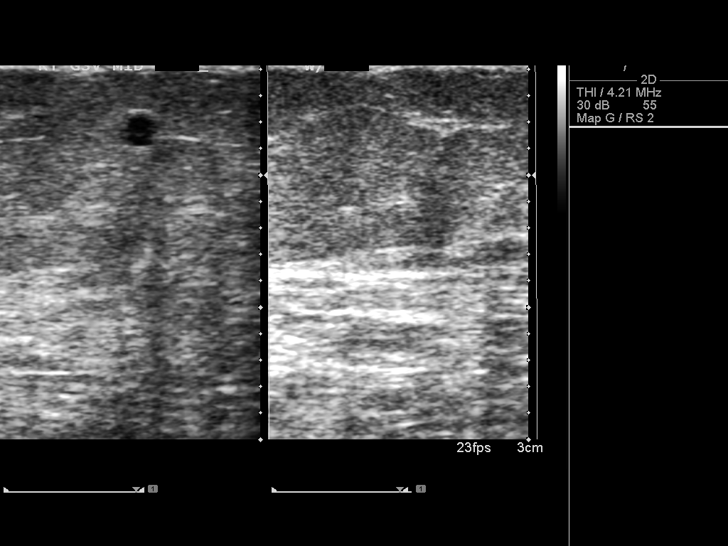
[im 19/62]
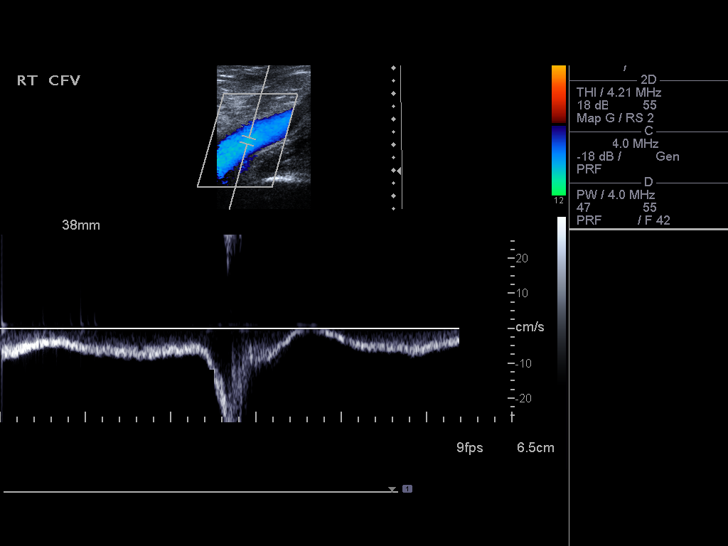
[im 24/62]
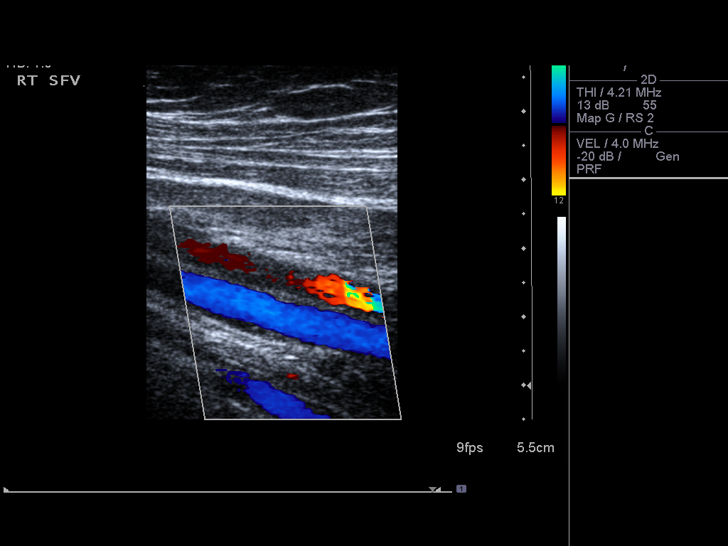
[im 30/62]
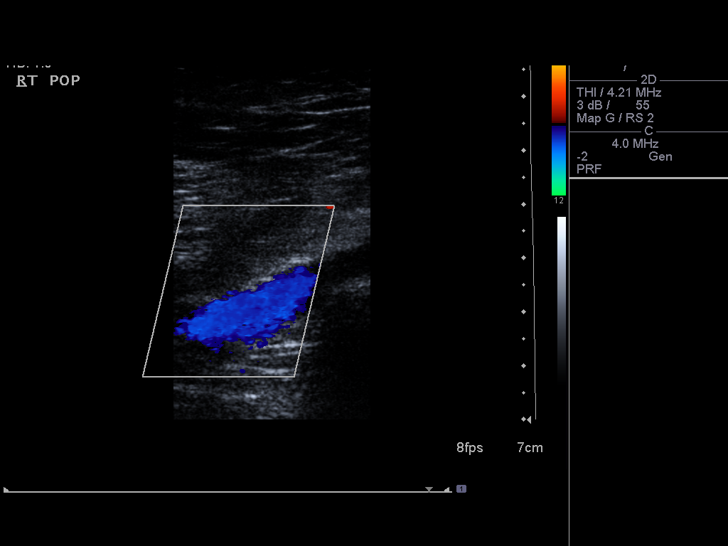
[im 32/62]
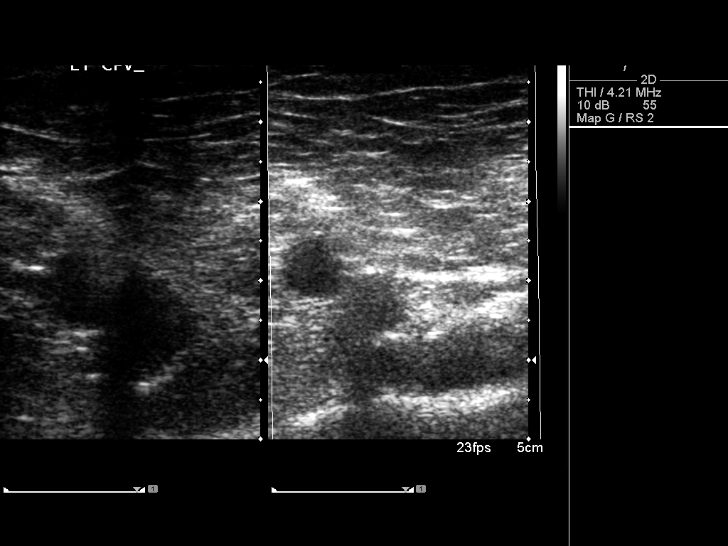
[im 38/62]
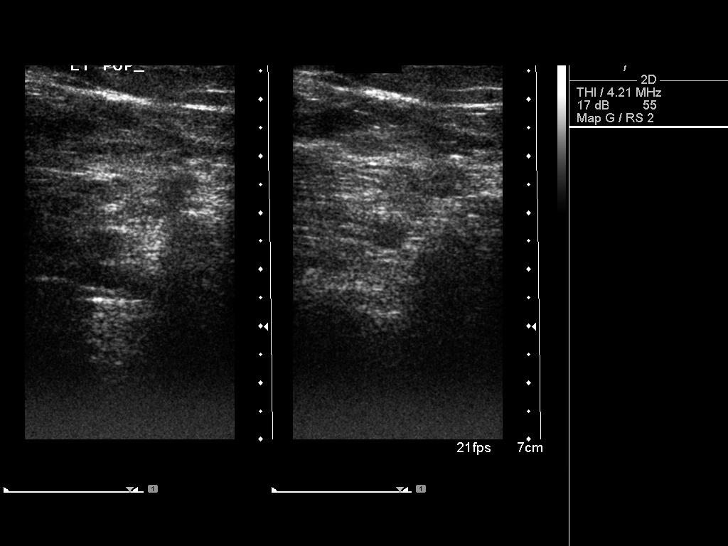
[im 43/62]
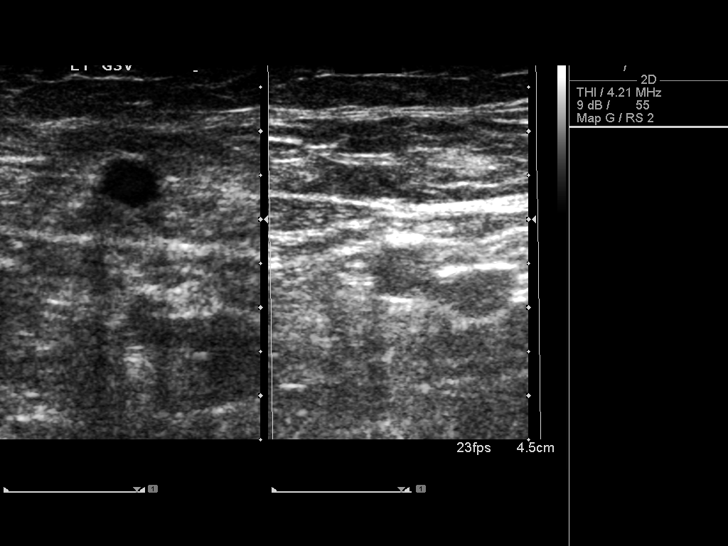
[im 48/62]
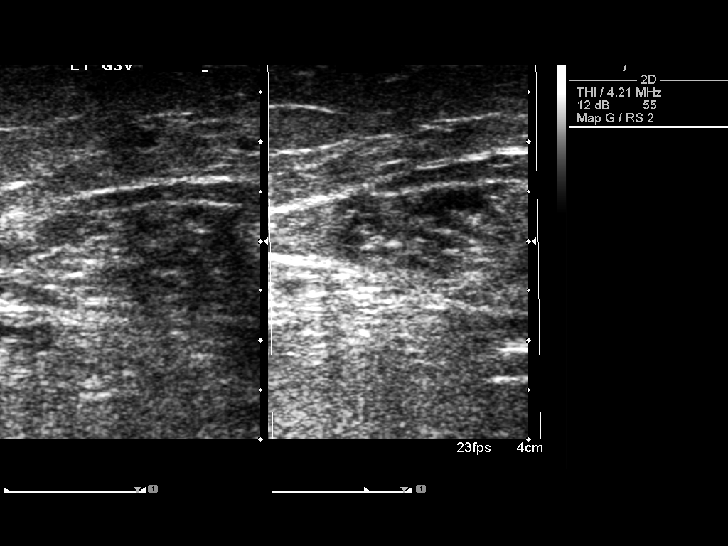
[im 51/62]
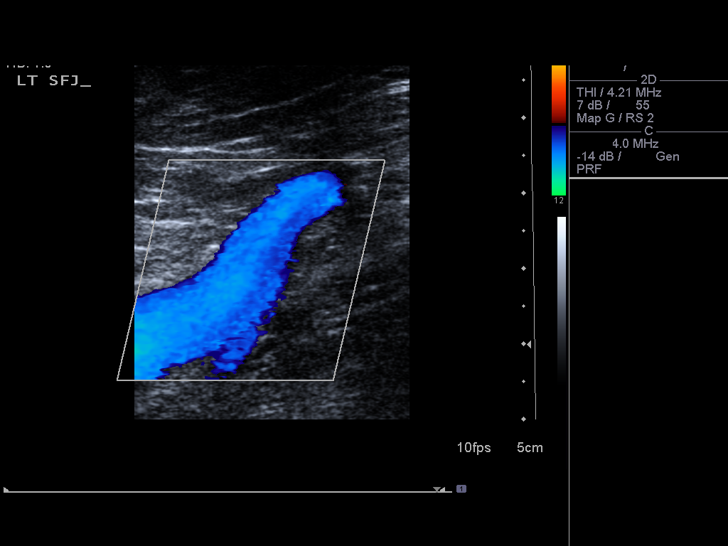
[im 56/62]
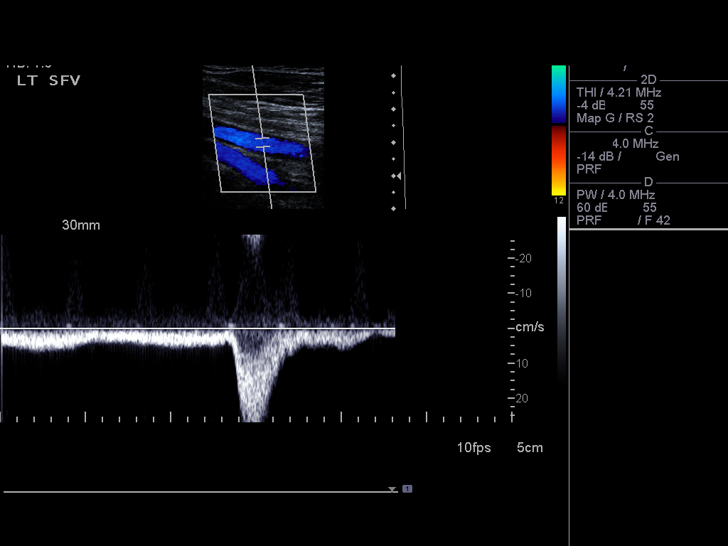
[im 62/62]
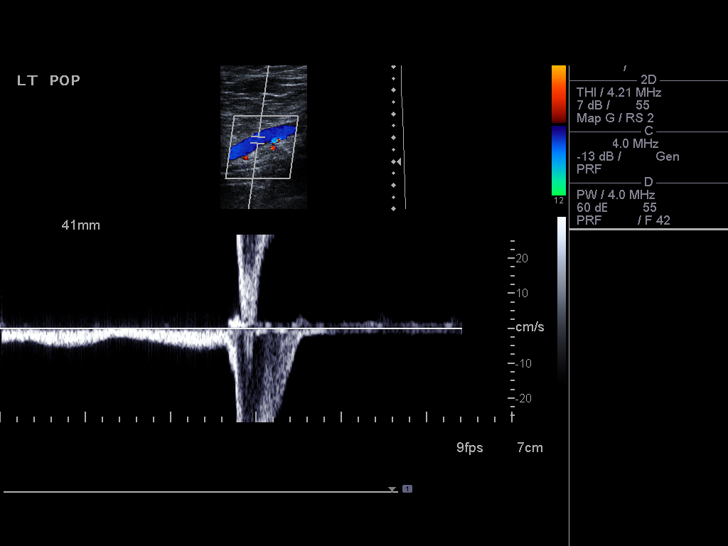

[14 of 24 positions shown; findings below may reference images not displayed]

FINDINGS: Normal compressibility of  the common femoral,
superficial femoral, and popliteal veins, as well as the proximal
calf veins.  No filling defects to suggest DVT on grayscale or
color Doppler imaging.  Doppler waveforms show normal direction of
venous flow, normal respiratory phasicity and response to
augmentation. The right femoropopliteal DVT seen previously has
resolved.
IMPRESSION: No evidence of  lower extremity deep vein thrombosis.

## 2014-12-04 NOTE — Telephone Encounter (Signed)
Please call and assess the patient's need. Be as specific as possible. Come back and talk to me once all the details are known. If what they need is a block, find out the location of the pain, laterality, pain referral, and their sedation/no sedation preference.

## 2014-12-05 ENCOUNTER — Telehealth: Payer: Self-pay | Admitting: Interventional Cardiology

## 2014-12-05 NOTE — Telephone Encounter (Signed)
New message      Returning a nurses call to get monitor results

## 2014-12-05 NOTE — Telephone Encounter (Signed)
Called patient and informed of normal sinus rhythm and normal heart monitor test.

## 2014-12-05 NOTE — Telephone Encounter (Signed)
Error

## 2014-12-06 ENCOUNTER — Telehealth: Payer: Self-pay

## 2014-12-06 NOTE — Telephone Encounter (Signed)
Case worker needs to know which body part the tramadol you are prescribing is for. Workers comp is denying her the medicine because they dont know exactly what it is for.

## 2014-12-06 NOTE — Telephone Encounter (Signed)
Left patient a message and told her to call back if there was anythinig I could help her with.

## 2014-12-10 ENCOUNTER — Encounter: Payer: Self-pay | Admitting: Pain Medicine

## 2014-12-11 NOTE — Telephone Encounter (Signed)
Please call and assess the patient's need. Be as specific as possible. Come back and talk to me once all the details are known. If what they need is a block, find out the location of the pain, laterality, pain referral, and their sedation/no sedation preference.

## 2014-12-12 ENCOUNTER — Telehealth: Payer: Self-pay

## 2014-12-12 ENCOUNTER — Other Ambulatory Visit: Payer: Self-pay | Admitting: Pain Medicine

## 2014-12-12 NOTE — Telephone Encounter (Signed)
Spoke with patient and she said everything was taken care of.

## 2014-12-12 NOTE — Telephone Encounter (Signed)
Spoke with patient on 12-12-14 and she states that everything had been taken care of.

## 2014-12-16 ENCOUNTER — Ambulatory Visit: Admission: RE | Admit: 2014-12-16 | Payer: Medicare Other | Source: Ambulatory Visit | Admitting: Gastroenterology

## 2014-12-16 ENCOUNTER — Encounter: Admission: RE | Payer: Self-pay | Source: Ambulatory Visit

## 2014-12-16 SURGERY — COLONOSCOPY WITH PROPOFOL
Anesthesia: General

## 2014-12-25 ENCOUNTER — Ambulatory Visit: Payer: Medicare Other | Attending: Pain Medicine | Admitting: Pain Medicine

## 2014-12-25 ENCOUNTER — Encounter: Payer: Self-pay | Admitting: Pain Medicine

## 2014-12-25 VITALS — BP 112/76 | HR 79 | Temp 97.7°F | Resp 16 | Ht 65.0 in | Wt 195.0 lb

## 2014-12-25 DIAGNOSIS — M545 Low back pain: Secondary | ICD-10-CM

## 2014-12-25 DIAGNOSIS — Z79899 Other long term (current) drug therapy: Secondary | ICD-10-CM | POA: Diagnosis not present

## 2014-12-25 DIAGNOSIS — I10 Essential (primary) hypertension: Secondary | ICD-10-CM | POA: Insufficient documentation

## 2014-12-25 DIAGNOSIS — F329 Major depressive disorder, single episode, unspecified: Secondary | ICD-10-CM | POA: Insufficient documentation

## 2014-12-25 DIAGNOSIS — M25572 Pain in left ankle and joints of left foot: Secondary | ICD-10-CM | POA: Diagnosis not present

## 2014-12-25 DIAGNOSIS — R52 Pain, unspecified: Secondary | ICD-10-CM | POA: Diagnosis present

## 2014-12-25 DIAGNOSIS — K219 Gastro-esophageal reflux disease without esophagitis: Secondary | ICD-10-CM | POA: Diagnosis not present

## 2014-12-25 DIAGNOSIS — G8929 Other chronic pain: Secondary | ICD-10-CM

## 2014-12-25 DIAGNOSIS — Z7984 Long term (current) use of oral hypoglycemic drugs: Secondary | ICD-10-CM | POA: Insufficient documentation

## 2014-12-25 DIAGNOSIS — Z79891 Long term (current) use of opiate analgesic: Secondary | ICD-10-CM | POA: Diagnosis not present

## 2014-12-25 DIAGNOSIS — M5136 Other intervertebral disc degeneration, lumbar region: Secondary | ICD-10-CM | POA: Insufficient documentation

## 2014-12-25 DIAGNOSIS — K589 Irritable bowel syndrome without diarrhea: Secondary | ICD-10-CM | POA: Diagnosis not present

## 2014-12-25 DIAGNOSIS — E119 Type 2 diabetes mellitus without complications: Secondary | ICD-10-CM | POA: Diagnosis not present

## 2014-12-25 DIAGNOSIS — G894 Chronic pain syndrome: Secondary | ICD-10-CM

## 2014-12-25 DIAGNOSIS — M533 Sacrococcygeal disorders, not elsewhere classified: Secondary | ICD-10-CM | POA: Diagnosis not present

## 2014-12-25 DIAGNOSIS — M47816 Spondylosis without myelopathy or radiculopathy, lumbar region: Secondary | ICD-10-CM | POA: Insufficient documentation

## 2014-12-25 DIAGNOSIS — F419 Anxiety disorder, unspecified: Secondary | ICD-10-CM | POA: Insufficient documentation

## 2014-12-25 DIAGNOSIS — E7800 Pure hypercholesterolemia, unspecified: Secondary | ICD-10-CM | POA: Diagnosis not present

## 2014-12-25 DIAGNOSIS — G43909 Migraine, unspecified, not intractable, without status migrainosus: Secondary | ICD-10-CM | POA: Diagnosis present

## 2014-12-25 DIAGNOSIS — E1140 Type 2 diabetes mellitus with diabetic neuropathy, unspecified: Secondary | ICD-10-CM | POA: Diagnosis not present

## 2014-12-25 DIAGNOSIS — Z87891 Personal history of nicotine dependence: Secondary | ICD-10-CM | POA: Insufficient documentation

## 2014-12-25 DIAGNOSIS — F1190 Opioid use, unspecified, uncomplicated: Secondary | ICD-10-CM | POA: Diagnosis not present

## 2014-12-25 MED ORDER — HYDROCODONE-ACETAMINOPHEN 5-325 MG PO TABS: 1.0000 | ORAL_TABLET | Freq: Every day | ORAL | Status: DC | PRN

## 2014-12-25 NOTE — Progress Notes (Signed)
Patient's Name: Heather Haley MRN: [id number] DOB: Jul 16, 1960 DOS: 12/25/2014  Primary Reason(s) for Visit: Encounter for Medication Management. CC: Pain and Migraine   HPI:   Heather Haley is a 54 y.o. year old, female patient, who returns today as an established patient. She has Left ankle pain (work-related injury) (Date of injury 04/17/2006); Other enthesopathy of ankle and tarsus; FOOT PAIN, LEFT; ADVERSE DRUG REACTION; Left knee pain; Hypertension; IBS (irritable bowel syndrome); Postoperative nausea and vomiting; Left ankle instability; Left knee DJD; Depression; Headache(784.0); Morbid obesity ([place]); Hypersomnia; Acute bronchitis; Leg length inequality; Chest pressure; Other and unspecified angina pectoris; Lumbar Degenerative Disc Disease of L3-4 & L4-5; Chronic ankle pain; Encounter for therapeutic drug level monitoring; Encounter for long-term opiate analgesic use; Long-term current use of opiate analgesic; Uncomplicated opioid dependence ([place]); Opiate use; Chronic pain syndrome; Chronic constipation; Personal history of other diseases of the digestive system; Chronic pain following surgery or procedure; Other long term (current) drug therapy; Long term current use of opiate analgesic; Bilateral peripheral sensory neuropathy; Diabetic peripheral neuropathy ([place]); Generalized anxiety disorder; History of panic attacks; Insomnia; History of tobacco abuse; GERD (gastroesophageal reflux disease); Non-insulin dependent type 2 diabetes mellitus ([place]); Coccygeal pain; History of migraine; Radicular pain of lumbosacral region; and Chronic pain of left ankle (secondary to a work-related injury) on her problem list.. Her primarily concern today is the Pain and Migraine     The patient comes into the clinic today strictly for the care of her left ankle pain which is under her Worker's Compensation. There was some mixup with her medications and apparently Worker's Compensation was under the  impression that she was getting her hydrocodone for something else other than the ankle pain. They hydrocodone was temporarily changed and this caused a flareup of her pain. I am going to take this opportunity to clarify that the medications being prescribed are for her chronic pain, specifically the one in the ankle. I will be putting this patient back on the hydrocodone since this seemed to control her pain better. Her pain worsened to a point where it was causing a significant amount of tension which in turn was causing tension headaches which are now the patient is having problems with secondary to the disruption of her medication to these administrative issues. Hopefully with this clarification of the patient's chart, we will avoid running into this again in the future. He needs to be clear, that this note represents the necessary information needed for the medical necessity that is being requested. He needs to be clear that it is my professional opinion that it is medically necessary for this patient to continue the treatment that I have established for her. Any further requests for letters or question years, simply represent a form of harassment. Because of this patient's condition has deteriorated further, I believe that it is also necessary for her to seek that professional opinion of an orthopedic surgeon. This should be obtained as soon as possible and without further delay.  Today's Pain Score: 6  Reported level of pain is incompatible with clinical obrservations. This may be secondary to a possible lack of understanding on how the pain scale works. Pain Type: Chronic pain Pain Location: Ankle Pain Orientation: Left Pain Descriptors / Indicators: Constant, Throbbing Pain Frequency: Constant  Date of Last Visit: Date of Last Visit: 11/26/14 Service Provided on Last Visit: Service Provided on Last Visit: Evaluation  Pharmacotherapy Review:   Side-effects or Adverse reactions: None  reported. Effectiveness: Described as relatively effective,  allowing for increase in activities of daily living (ADL). Onset of action: Within expected pharmacological parameters. Duration of action: Within normal limits for medication. Peak effect: Timing and results are as within normal expected parameters. [place] PMP: Compliant with practice rules and regulations. UDS: Compliant with practice rules and regulations. Lab work: No new labs ordered by our practice. Treatment compliance: Compliant. The patient had not brought in her prescriptions or medications, but she came back and known and brought him back to that we could count them. She has 111 tramadol left, 19 hydrocodone, and this 5 old prescription for hydrocodone. Descriptors were taken into position and destroyed. The patient has been given new scripts clarifying that the medication is for the left ankle pain. Substance Use Disorder (SUD) Risk Level: Low Planned course of action: Continue therapy as is.  Allergies: Heather Haley is allergic to codeine; cymbalta; lyrica; morphine and related; nortripytline hcl; nsaids; and penicillins.  Meds: The patient has a current medication list which includes the following prescription(s): aspirin ec, bisacodyl, bupropion, buspirone, calcium carbonate, cetirizine, vitamin d, dexlansoprazole, dicyclomine, fluticasone, hypromellose, magnesium citrate, metformin, omeprazole, pravastatin, sumatriptan, topiramate, tramadol, trazodone, hydrocodone-acetaminophen, hydrocodone-acetaminophen, and hydrocodone-acetaminophen. Requested Prescriptions   Signed Prescriptions Disp Refills  . HYDROcodone-acetaminophen (NORCO/VICODIN) 5-325 MG tablet 30 tablet 0    Sig: Take 1 tablet by mouth daily as needed.  Marland Kitchen HYDROcodone-acetaminophen (NORCO/VICODIN) 5-325 MG tablet 30 tablet 0    Sig: Take 1 tablet by mouth daily as needed for moderate pain or severe pain.  Marland Kitchen HYDROcodone-acetaminophen (NORCO/VICODIN) 5-325 MG  tablet 30 tablet 0    Sig: Take 1 tablet by mouth daily as needed for moderate pain or severe pain.    ROS: Constitutional: Afebrile, no chills, well hydrated and well nourished Gastrointestinal: negative Musculoskeletal:negative Neurological: negative Behavioral/Psych: negative  PFSH: Medical:  Heather Haley  has a past medical history of Hypertension; IBS (irritable bowel syndrome); Postoperative nausea and vomiting; Diabetes mellitus ([place]); Left ankle instability; Headache(784.0); Left knee DJD; Depression; Frequency of urination; GERD (gastroesophageal reflux disease); High cholesterol; Neuromuscular disorder ([place]); and Anxiety. Family: family history includes Cancer in her mother; Heart disease in her father; Hypertension in her father. Surgical:  has past surgical history that includes Ankle surgery; Abdominal hysterectomy; Knee arthroscopy (1997); Total knee arthroplasty (08/30/2011); Colonoscopy (2013); Appendectomy; left heart catheterization with coronary angiogram (N/A, 01/11/2013); and Esophagogastroduodenoscopy (egd) with propofol (N/A, 10/03/2014). Tobacco:  reports that she quit smoking about 19 years ago. Her smoking use included Cigarettes. She has a 30 pack-year smoking history. She has never used smokeless tobacco. Alcohol:  reports that she does not drink alcohol. Drug:  reports that she does not use illicit drugs.  Physical Exam: Vitals:  Today's Vitals   12/25/14 0812  BP: 112/76  Pulse: 79  Temp: 97.7 F (36.5 C)  TempSrc: Oral  Resp: 16  Height: 5\' 5"  (1.651 m)  Weight: 195 lb (88.451 kg)  SpO2: 98%  PainSc: 6   PainLoc: Ankle  Calculated BMI: Body mass index is 32.45 kg/(m^2). General appearance: alert, cooperative, appears older than stated age, moderate distress, moderately obese and slowed mentation Eyes: conjunctivae/corneas clear. PERRL, EOM's intact. Fundi benign. Lungs: No evidence respiratory distress, no audible rales or ronchi and no use of  accessory muscles of respiration Neck: no adenopathy, no carotid bruit, no JVD, supple, symmetrical, trachea midline and thyroid not enlarged, symmetric, no tenderness/mass/nodules Back: symmetric, no curvature. ROM normal. No CVA tenderness. Extremities: The patient has a brace on her left ankle that is meant  to assist her with ambulation. Pulses: 2+ and symmetric Skin: Skin color, texture, turgor normal. No rashes or lesions Neurologic: Gait: Antalgic    Assessment: Encounter Diagnosis:  Primary Diagnosis: Chronic pain of left ankle [M25.572, G89.29]  Plan: Heather Haley was seen today for pain and migraine.  Diagnoses and all orders for this visit:  Chronic pain of left ankle (secondary to a work-related injury) -     HYDROcodone-acetaminophen (NORCO/VICODIN) 5-325 MG tablet; Take 1 tablet by mouth daily as needed. -     HYDROcodone-acetaminophen (NORCO/VICODIN) 5-325 MG tablet; Take 1 tablet by mouth daily as needed for moderate pain or severe pain. -     HYDROcodone-acetaminophen (NORCO/VICODIN) 5-325 MG tablet; Take 1 tablet by mouth daily as needed for moderate pain or severe pain. -     Ambulatory referral to Orthopedic Surgery  Long term current use of opiate analgesic  Opiate use  Other long term (current) drug therapy  Chronic pain syndrome     There are no Patient Instructions on file for this visit. Medications discontinued today:  Medications Discontinued During This Encounter  Medication Reason  . traMADol (ULTRAM) 50 MG tablet Duplicate  . topiramate (TOPAMAX) 100 MG tablet Dose change  . gabapentin (NEURONTIN) 100 MG capsule Discontinued by provider  . gabapentin (NEURONTIN) 300 MG capsule Discontinued by provider  . lubiprostone (AMITIZA) 24 MCG capsule Cost of medication  . HYDROcodone-acetaminophen (NORCO/VICODIN) 5-325 MG tablet Reorder  . HYDROcodone-acetaminophen (NORCO/VICODIN) 5-325 MG tablet Reorder  . HYDROcodone-acetaminophen (NORCO/VICODIN) 5-325 MG  tablet Reorder  . HYDROcodone-acetaminophen (NORCO/VICODIN) 5-325 MG tablet Reorder  . HYDROcodone-acetaminophen (NORCO/VICODIN) 5-325 MG tablet Reorder  . HYDROcodone-acetaminophen (NORCO/VICODIN) 5-325 MG tablet Reorder   Medications administered today:  Heather Haley had no medications administered during this visit.   PS: I was called back by the staff to be made aware of the fact that the patient had a recent recording device with her. I had a conversation with the patient refers to this and I make sure that she understood that recording conversations without the knowledge of the other individual could represent a problem. I am not sure about the reality of this but I have requested that the patient gave me her lawyers information so that he can contact him and see if we can straighten this out. I know that she is frustrated with the administrative issues associated with Worker's Compensation, but I think that she is taking the wrong approach. At this point, although I wanted to use concentrate on her medical care and have nothing to do with the administrative issues that are plaguing in this case. The fact that she is carrying a recording device to her medical appointments makes me think there is an agenda.  Today I had a conversation with the patient's lawyer, Mr. Ranae Pila [phone], with regards to the problems that we've been having. It would seem that the patient is somewhat paranoid with regards to her care. He indicates that he was not aware that she had that recording device and he also indicated that he would talk to her about this.  Primary Care Physician: Shamleffer, Herschell Dimes, MD Location: [facility] Note by: Kathlen Brunswick. Dossie Arbour, M.D, DABA, DABAPM, DABPM, DABIPP, FIPP

## 2014-12-25 NOTE — Progress Notes (Signed)
Pill count:  Tramadol 50 mg - # 111 Hydrocodone - # 10 Three old scripts for Hydrocodone dated 11/19/14 were torn up and destroyed.

## 2014-12-25 NOTE — Progress Notes (Signed)
Pill count: patient did not bring to appointment

## 2015-01-14 ENCOUNTER — Telehealth: Payer: Self-pay | Admitting: Pain Medicine

## 2015-01-14 NOTE — Telephone Encounter (Signed)
Patient wants to get epid in upper back / this will not be worker's comp/ will be filed on bcbs / can we schedule this ?

## 2015-01-15 ENCOUNTER — Encounter: Payer: Self-pay | Admitting: Pain Medicine

## 2015-01-21 ENCOUNTER — Encounter: Payer: Self-pay | Admitting: Pain Medicine

## 2015-01-21 ENCOUNTER — Ambulatory Visit: Payer: Medicare Other | Attending: Pain Medicine | Admitting: Pain Medicine

## 2015-01-21 VITALS — BP 137/86 | HR 80 | Temp 97.7°F | Resp 17 | Ht 65.5 in | Wt 190.0 lb

## 2015-01-21 DIAGNOSIS — F411 Generalized anxiety disorder: Secondary | ICD-10-CM | POA: Diagnosis not present

## 2015-01-21 DIAGNOSIS — M2170 Unequal limb length (acquired), unspecified site: Secondary | ICD-10-CM | POA: Insufficient documentation

## 2015-01-21 DIAGNOSIS — M549 Dorsalgia, unspecified: Secondary | ICD-10-CM | POA: Diagnosis present

## 2015-01-21 DIAGNOSIS — M545 Low back pain: Secondary | ICD-10-CM

## 2015-01-21 DIAGNOSIS — M47896 Other spondylosis, lumbar region: Secondary | ICD-10-CM

## 2015-01-21 DIAGNOSIS — I10 Essential (primary) hypertension: Secondary | ICD-10-CM | POA: Diagnosis not present

## 2015-01-21 DIAGNOSIS — K219 Gastro-esophageal reflux disease without esophagitis: Secondary | ICD-10-CM | POA: Insufficient documentation

## 2015-01-21 DIAGNOSIS — E119 Type 2 diabetes mellitus without complications: Secondary | ICD-10-CM | POA: Diagnosis not present

## 2015-01-21 DIAGNOSIS — M47816 Spondylosis without myelopathy or radiculopathy, lumbar region: Secondary | ICD-10-CM

## 2015-01-21 DIAGNOSIS — M47817 Spondylosis without myelopathy or radiculopathy, lumbosacral region: Secondary | ICD-10-CM | POA: Diagnosis not present

## 2015-01-21 DIAGNOSIS — F329 Major depressive disorder, single episode, unspecified: Secondary | ICD-10-CM | POA: Diagnosis not present

## 2015-01-21 DIAGNOSIS — F1190 Opioid use, unspecified, uncomplicated: Secondary | ICD-10-CM | POA: Diagnosis not present

## 2015-01-21 DIAGNOSIS — K589 Irritable bowel syndrome without diarrhea: Secondary | ICD-10-CM | POA: Insufficient documentation

## 2015-01-21 DIAGNOSIS — G8929 Other chronic pain: Secondary | ICD-10-CM

## 2015-01-21 MED ORDER — TRIAMCINOLONE ACETONIDE 40 MG/ML IJ SUSP
INTRAMUSCULAR | Status: AC
  Administered 2015-01-21: 40 mg
  Filled 2015-01-21: qty 1

## 2015-01-21 MED ORDER — ROPIVACAINE HCL 2 MG/ML IJ SOLN
9.0000 mL | Freq: Once | INTRAMUSCULAR | Status: AC
  Administered 2015-01-21: 9 mL

## 2015-01-21 MED ORDER — ROPIVACAINE HCL 2 MG/ML IJ SOLN
INTRAMUSCULAR | Status: AC
  Administered 2015-01-21: 9 mL
  Filled 2015-01-21: qty 10

## 2015-01-21 MED ORDER — TRIAMCINOLONE ACETONIDE 40 MG/ML IJ SUSP
40.0000 mg | Freq: Once | INTRAMUSCULAR | Status: AC
  Administered 2015-01-21: 40 mg

## 2015-01-21 MED ORDER — LIDOCAINE HCL (PF) 1 % IJ SOLN: 10.0000 mL | Freq: Once | INTRAMUSCULAR | Status: DC

## 2015-01-21 NOTE — Progress Notes (Signed)
Patient here today for back injection with no sedation.

## 2015-01-21 NOTE — Progress Notes (Signed)
Post procedure, arching back and twist to R, patient experiences no pain.   Pain scale  0

## 2015-01-21 NOTE — Progress Notes (Signed)
Patient's Name: Heather Haley MRN: [id number] DOB: 10-05-1960 DOS: 01/21/2015  Primary Reason(s) for Visit: Interventional Pain Management Treatment. CC: Back Pain   Pre-Procedure Assessment: Ms. Colombe is a 54 y.o. year old, female patient, seen today for interventional treatment. She has Left ankle pain (work-related injury) (Date of injury 04/17/2006); Other enthesopathy of ankle and tarsus; FOOT PAIN, LEFT; ADVERSE DRUG REACTION; Left knee pain; Hypertension; IBS (irritable bowel syndrome); Postoperative nausea and vomiting; Left ankle instability; Left knee DJD; Depression; Headache(784.0); Morbid obesity ([place]); Hypersomnia; Acute bronchitis; Leg length inequality; Chest pressure; Other and unspecified angina pectoris; Lumbar Degenerative Disc Disease of L3-4 & L4-5; Chronic ankle pain; Encounter for therapeutic drug level monitoring; Encounter for long-term opiate analgesic use; Long-term current use of opiate analgesic; Uncomplicated opioid dependence ([place]); Opiate use; Chronic pain syndrome; Chronic constipation; Personal history of other diseases of the digestive system; Chronic pain following surgery or procedure; Other long term (current) drug therapy; Long term current use of opiate analgesic; Bilateral peripheral sensory neuropathy; Diabetic peripheral neuropathy ([place]); Generalized anxiety disorder; History of panic attacks; Insomnia; History of tobacco abuse; GERD (gastroesophageal reflux disease); Non-insulin dependent type 2 diabetes mellitus ([place]); Coccygeal pain; History of migraine; Radicular pain of lumbosacral region; Chronic pain of left ankle (secondary to a work-related injury); Chronic low back pain (Location of Primary Pain) (Bilateral) (R>L); Lumbar spondylosis; and Lumbar facet syndrome (R>L) on her problem list.. Her primarily concern today is the Back Pain Verification of the correct person, correct site (including marking of site), and correct procedure were performed  and confirmed by the patient. Today's Vitals   01/21/15 1213 01/21/15 1217 01/21/15 1219 01/21/15 1230  BP: 138/79 130/73 137/86   Pulse: 82 80 80   Temp:      TempSrc:      Resp: '12 19 17   ' Height:      Weight:      SpO2: 97% 96% 98%   PainSc:   0-No pain 0-No pain  Calculated BMI: Body mass index is 31.13 kg/(m^2). Allergies: She is allergic to codeine; cymbalta; lyrica; morphine and related; nortripytline hcl; nsaids; and penicillins.. Primary Diagnosis: Facet syndrome, lumbar [M54.5]  Procedure: Type: Diagnostic Medial Branch Facet Block Region: Lumbar Level: L2, L3, L4, L5, & S1 Medial Branch Level(s) Laterality: Right  Indications: Spondylosis, Lumbosacral Region Lumbosacral Spine Pain  Consent: Secured. Under the influence of no sedatives a written informed consent was obtained, after having provided information on the risks and possible complications. To fulfill our ethical and legal obligations, as recommended by the American Medical Association's Code of Ethics, we have provided information to the patient about our clinical impression; the nature and purpose of the treatment or procedure; the risks, benefits, and possible complications of the intervention; alternatives; the risk(s) and benefit(s) of the alternative treatment(s) or procedure(s); and the risk(s) and benefit(s) of doing nothing. The patient was provided information about the risks and possible complications associated with the procedure. In the case of spinal procedures these may include, but are not limited to, failure to achieve desired goals, infection, bleeding, organ or nerve damage, allergic reactions, paralysis, and death. In addition, the patient was informed that Medicine is not an exact science; therefore, there is also the possibility of unforeseen risks and possible complications that may result in a catastrophic outcome. The patient indicated having understood very clearly. We have given the patient no  guarantees and we have made no promises. Enough time was given to the patient to ask questions, all of  which were answered to the patient's satisfaction.  Pre-Procedure Preparation: Safety Precautions: Allergies reviewed. Appropriate site, procedure, and patient were confirmed by following the Joint Commission's Universal Protocol (UP.01.01.01), in the form of a "Time Out". The patient was asked to confirm marked site and procedure, before commencing. The patient was asked about blood thinners, or active infections, both of which were denied. Patient was assessed for positional comfort and all pressure points were checked before starting procedure. Monitoring:  As per clinic protocol. Infection Control Precautions: Sterile technique used. Standard Universal Precautions were taken as recommended by the Department of [facility] for Disease Control and Prevention (CDC). Standard pre-surgical skin prep was conducted. Respiratory hygiene and cough etiquette was practiced. Hand hygiene observed. Safe injection practices and needle disposal techniques followed. SDV (single dose vial) medications used. Medications properly checked for expiration dates and contaminants. Personal protective equipment (PPE) used: Sterile double glove technique. Radiation resistant gloves. Sterile surgical gloves.  Anesthesia, Analgesia, Anxiolysis: Type: Local Anesthesia Local Anesthetic(s): Lidocaine 1% IV Access: Declined. Sedation: Declined. Indication(s):Patient declined and Analgesia.  Description of Procedure Process:  Time-out: "Time-out" completed before starting procedure, as per protocol. Position: Prone Target Area: For Lumbar Facet blocks, the target is the groove formed by the junction of the transverse process and superior articular process. For the L5 dorsal ramus, the target is the notch between superior articular process and sacral ala. For the S1 dorsal ramus, the target is the superior and lateral  edge of the posterior S1 Sacral foramen. Approach: Paramedial approach. Area Prepped: Entire Posterior Lumbosacral Region Prepping solution: ChloraPrep (2% chlorhexidine gluconate and 70% isopropyl alcohol) Safety Precautions: Aspiration looking for blood return was conducted prior to all injections. At no point did we inject any substances, as a needle was being advanced. No attempts were made at seeking any paresthesias. Safe injection practices and needle disposal techniques used. Medications properly checked for expiration dates. SDV (single dose vial) medications used. Description of the Procedure: Protocol guidelines were followed. The patient was placed in position over the fluoroscopy table. The target area was identified and the area prepped in the usual manner. Skin desensitized using vapocoolant spray. Skin & deeper tissues infiltrated with local anesthetic. Appropriate amount of time allowed to pass for local anesthetics to take effect. The procedure needle was introduced through the skin, ipsilateral to the reported pain, and advanced to the target area. Employing the "Medial Branch Technique", the needles were advanced to the angle made by the superior and medial portion of the transverse process, and the lateral and inferior portion of the superior articulating process of the targeted vertebral bodies. This area is known as "Burton's Eye" or the "Eye of the [place] Dog". A procedure needle was introduced through the skin, and this time advanced to the angle made by the superior and medial border of the sacral ala, and the lateral border of the S1 vertebral body. This last needle was later repositioned at the superior and lateral border of the posterior S1 foramen. Negative aspiration confirmed. Solution injected in intermittent fashion, asking for systemic symptoms every 0.5cc of injectate. The needles were then removed and the area cleansed, making sure to leave some of the prepping solution  back to take advantage of its long term bactericidal properties. EBL: None Materials & Medications Used:  Needle(s) Used: 25g - 3.5" Spinal Needle(s) Medications Administered today: We administered ropivacaine (PF) 2 mg/ml (0.2%), triamcinolone acetonide, ropivacaine (PF) 2 mg/ml (0.2%), and triamcinolone acetonide.Please see chart orders for dosing details.  Imaging Guidance:  Type of Imaging Technique: Fluoroscopy Guidance (Spinal) Indication(s): Assistance in needle guidance and placement for procedures requiring needle placement in or near specific anatomical locations not easily accessible without such assistance. Exposure Time: Please see nurses notes. Contrast: None required. Fluoroscopic Guidance: I was personally present in the fluoroscopy suite, where the patient was placed in position for the procedure, over the fluoroscopy-compatible table. Fluoroscopy was manipulated, using "Tunnel Vision Technique", to obtain the best possible view of the target area, on the affected side. Parallax error was corrected before commencing the procedure. A "direction-depth-direction" technique was used to introduce the needle under continuous pulsed fluoroscopic guidance. Once the target was reached, antero-posterior, oblique, and lateral fluoroscopic projection views were taken to confirm needle placement in all planes. Permanently recorded images stored by scanning into EMR. Interpretation: Intraoperative imaging interpretation by performing Physician. Adequate needle placement confirmed. Adequate needle placement confirmed in AP, lateral, & Oblique Views. No contrast injected.  Antibiotics:  Type:  Antibiotics Given (last 72 hours)    None      Indication(s): No indications identified.  Post-operative Assessment:  Complications: No immediate post-treatment complications were observed. Disposition: Return to clinic for follow-up evaluation. The patient tolerated the entire procedure well. A repeat  set of vitals were taken after the procedure and the patient was kept under observation following institutional policy, for this procedure. Post-procedural neurological assessment was performed, showing return to baseline, prior to discharge. The patient was discharged home, once institutional criteria were met. The patient was provided with post-procedure discharge instructions, including a section on how to identify potential problems. Should any problems arise concerning this procedure, the patient was given instructions to immediately contact us, at any time, without hesitation. In any case, we plan to contact the patient by telephone for a follow-up status report regarding this interventional procedure. Comments:  No additional relevant information.  Primary Care Physician: Shamleffer, Herschell Dimes, MD Location: [facility] Note by: Kathlen Brunswick. Dossie Arbour, M.D, DABA, DABAPM, DABPM, DABIPP, FIPP   Illustration of the posterior view of the lumbar spine and the posterior neural structures. Laminae of L2 through S1 are labeled. DPRL5, dorsal primary ramus of L5; DPRS1, dorsal primary ramus of S1; DPR3, dorsal primary ramus of L3; FJ, facet (zygapophyseal) joint L3-L4; I, inferior articular process of L4; LB1, lateral branch of dorsal primary ramus of L1; IAB, inferior articular branches from L3 medial branch (supplies L4-L5 facet joint); IBP, intermediate branch plexus; MB3, medial branch of dorsal primary ramus of L3; NR3, third lumbar nerve root; S, superior articular process of L5; SAB, superior articular branches from L4 (supplies L4-5 facet joint also); TP3, transverse process of L3.  Disclaimer:  Medicine is not an Chief Strategy Officer. The only guarantee in medicine is that nothing is guaranteed. It is important to note that the decision to proceed with this intervention was based on the information collected from the patient. The Data and conclusions were drawn from the  patient's questionnaire, the interview, and the physical examination. Because the information was provided in large part by the patient, it cannot be guaranteed that it has not been purposely or unconsciously manipulated. Every effort has been made to obtain as much relevant data as possible for this evaluation. It is important to note that the conclusions that lead to this procedure are derived in large part from the available data. Always take into account that the treatment will also be dependent on availability of resources and existing treatment guidelines, considered by  other Pain Management Practitioners as being common knowledge and practice, at the time of the intervention. For Medico-Legal purposes, it is also important to point out that variation in procedural techniques and pharmacological choices are the acceptable norm. The indications, contraindications, technique, and results of the above procedure should only be interpreted and judged by a Board-Certified Interventional Pain Specialist with extensive familiarity and expertise in the same exact procedure and technique. Attempts at providing opinions without similar or greater experience and expertise than that of the treating physician will be considered as inappropriate and unethical, and shall result in a formal complaint to the state medical board and applicable specialty societies.

## 2015-01-21 NOTE — Patient Instructions (Signed)
Facet Blocks Patient Information  Description: The facets are joints in the spine between the vertebrae.  Like any joints in the body, facets can become irritated and painful.  Arthritis can also effect the facets.  By injecting steroids and local anesthetic in and around these joints, we can temporarily block the nerve supply to them.  Steroids act directly on irritated nerves and tissues to reduce selling and inflammation which often leads to decreased pain.  Facet blocks may be done anywhere along the spine from the neck to the low back depending upon the location of your pain.   After numbing the skin with local anesthetic (like Novocaine), a small needle is passed onto the facet joints under x-ray guidance.  You may experience a sensation of pressure while this is being done.  The entire block usually lasts about 15-25 minutes.   Conditions which may be treated by facet blocks:   Low back/buttock pain  Neck/shoulder pain  Certain types of headaches  Preparation for the injection:  1. Do not eat any solid food or dairy products within 6 hours of your appointment. 2. You may drink clear liquid up to 2 hours before appointment.  Clear liquids include water, black coffee, juice or soda.  No milk or cream please. 3. You may take your regular medication, including pain medications, with a sip of water before your appointment.  Diabetics should hold regular insulin (if taken separately) and take 1/2 normal NPH dose the morning of the procedure.  Carry some sugar containing items with you to your appointment. 4. A driver must accompany you and be prepared to drive you home after your procedure. 5. Bring all your current medications with you. 6. An IV may be inserted and sedation may be given at the discretion of the physician. 7. A blood pressure cuff, EKG and other monitors will often be applied during the procedure.  Some patients may need to have extra oxygen administered for a short  period. 8. You will be asked to provide medical information, including your allergies and medications, prior to the procedure.  We must know immediately if you are taking blood thinners (like Coumadin/Warfarin) or if you are allergic to IV iodine contrast (dye).  We must know if you could possible be pregnant.  Possible side-effects:   Bleeding from needle site  Infection (rare, may require surgery)  Nerve injury (rare)  Numbness & tingling (temporary)  Difficulty urinating (rare, temporary)  Spinal headache (a headache worse with upright posture)  Light-headedness (temporary)  Pain at injection site (serveral days)  Decreased blood pressure (rare, temporary)  Weakness in arm/leg (temporary)  Pressure sensation in back/neck (temporary)   Call if you experience:   Fever/chills associated with headache or increased back/neck pain  Headache worsened by an upright position  New onset, weakness or numbness of an extremity below the injection site  Hives or difficulty breathing (go to the emergency room)  Inflammation or drainage at the injection site(s)  Severe back/neck pain greater than usual  New symptoms which are concerning to you  Please note:  Although the local anesthetic injected can often make your back or neck feel good for several hours after the injection, the pain will likely return. It takes 3-7 days for steroids to work.  You may not notice any pain relief for at least one week.  If effective, we will often do a series of 2-3 injections spaced 3-6 weeks apart to maximally decrease your pain.  After the initial   series, you may be a candidate for a more permanent nerve block of the facets.  If you have any questions, please call [phone] [facility]  General Discharge Instructions :  If you need to reach your doctor call: Monday-Friday 8:00 am - 4:00 pm at [phone] or  toll free [phone].  After clinic hours [phone] to have operator reach doctor.  Bring all of your medication bottles to all your appointments in the pain clinic.  To cancel or reschedule your appointment with Pain Management please remember to call 24 hours in advance to avoid a fee.  Refer to the educational materials which you have been given on: General Risks, I had my Procedure. Discharge Instructions, Post Sedation.  Post Procedure Instructions:  The drugs you were given will stay in your system until tomorrow, so for the next 24 hours you should not drive, make any legal decisions or drink any alcoholic beverages.  You may eat anything you prefer, but it is better to start with liquids then soups and crackers, and gradually work up to solid foods.  Please notify your doctor immediately if you have any unusual bleeding, trouble breathing or pain that is not related to your normal pain.  Depending on the type of procedure that was done, some parts of your body may feel week and/or numb.  This usually clears up by tonight or the next day.  Walk with the use of an assistive device or accompanied by an adult for the 24 hours.  You may use ice on the affected area for the first 24 hours.  Put ice in a Ziploc bag and cover with a towel and place against area 15 minutes on 15 minutes off.  You may switch to heat after 24 hours. 

## 2015-01-22 ENCOUNTER — Encounter: Payer: Self-pay | Admitting: Pain Medicine

## 2015-01-22 ENCOUNTER — Telehealth: Payer: Self-pay | Admitting: *Deleted

## 2015-01-22 NOTE — Telephone Encounter (Signed)
Left voice mail

## 2015-02-05 ENCOUNTER — Ambulatory Visit: Payer: Medicare Other | Attending: Pain Medicine | Admitting: Pain Medicine

## 2015-02-05 ENCOUNTER — Encounter: Payer: Self-pay | Admitting: Pain Medicine

## 2015-02-05 ENCOUNTER — Other Ambulatory Visit: Payer: Self-pay | Admitting: Pain Medicine

## 2015-02-05 VITALS — BP 116/81 | HR 71 | Temp 98.0°F | Resp 16 | Ht 65.0 in | Wt 190.0 lb

## 2015-02-05 DIAGNOSIS — Z87891 Personal history of nicotine dependence: Secondary | ICD-10-CM | POA: Diagnosis not present

## 2015-02-05 DIAGNOSIS — F329 Major depressive disorder, single episode, unspecified: Secondary | ICD-10-CM | POA: Diagnosis not present

## 2015-02-05 DIAGNOSIS — K219 Gastro-esophageal reflux disease without esophagitis: Secondary | ICD-10-CM | POA: Diagnosis not present

## 2015-02-05 DIAGNOSIS — M47816 Spondylosis without myelopathy or radiculopathy, lumbar region: Secondary | ICD-10-CM

## 2015-02-05 DIAGNOSIS — M549 Dorsalgia, unspecified: Secondary | ICD-10-CM | POA: Diagnosis present

## 2015-02-05 DIAGNOSIS — G894 Chronic pain syndrome: Secondary | ICD-10-CM | POA: Insufficient documentation

## 2015-02-05 DIAGNOSIS — F419 Anxiety disorder, unspecified: Secondary | ICD-10-CM | POA: Diagnosis not present

## 2015-02-05 DIAGNOSIS — G8929 Other chronic pain: Secondary | ICD-10-CM | POA: Diagnosis not present

## 2015-02-05 DIAGNOSIS — M545 Low back pain, unspecified: Secondary | ICD-10-CM

## 2015-02-05 DIAGNOSIS — F1190 Opioid use, unspecified, uncomplicated: Secondary | ICD-10-CM | POA: Diagnosis not present

## 2015-02-05 DIAGNOSIS — K589 Irritable bowel syndrome without diarrhea: Secondary | ICD-10-CM | POA: Insufficient documentation

## 2015-02-05 DIAGNOSIS — M25572 Pain in left ankle and joints of left foot: Secondary | ICD-10-CM | POA: Insufficient documentation

## 2015-02-05 DIAGNOSIS — Z79891 Long term (current) use of opiate analgesic: Secondary | ICD-10-CM

## 2015-02-05 DIAGNOSIS — Z5181 Encounter for therapeutic drug level monitoring: Secondary | ICD-10-CM | POA: Diagnosis not present

## 2015-02-05 DIAGNOSIS — I10 Essential (primary) hypertension: Secondary | ICD-10-CM | POA: Insufficient documentation

## 2015-02-05 DIAGNOSIS — E119 Type 2 diabetes mellitus without complications: Secondary | ICD-10-CM | POA: Diagnosis not present

## 2015-02-05 DIAGNOSIS — Z9889 Other specified postprocedural states: Secondary | ICD-10-CM | POA: Diagnosis not present

## 2015-02-05 NOTE — Patient Instructions (Signed)
GENERAL RISKS AND COMPLICATIONS  What are the risk, side effects and possible complications? Generally speaking, most procedures are safe.  However, with any procedure there are risks, side effects, and the possibility of complications.  The risks and complications are dependent upon the sites that are lesioned, or the type of nerve block to be performed.  The closer the procedure is to the spine, the more serious the risks are.  Great care is taken when placing the radio frequency needles, block needles or lesioning probes, but sometimes complications can occur. 1. Infection: Any time there is an injection through the skin, there is a risk of infection.  This is why sterile conditions are used for these blocks.  There are four possible types of infection. 1. Localized skin infection. 2. Central Nervous System Infection-This can be in the form of Meningitis, which can be deadly. 3. Epidural Infections-This can be in the form of an epidural abscess, which can cause pressure inside of the spine, causing compression of the spinal cord with subsequent paralysis. This would require an emergency surgery to decompress, and there are no guarantees that the patient would recover from the paralysis. 4. Discitis-This is an infection of the intervertebral discs.  It occurs in about 1% of discography procedures.  It is difficult to treat and it may lead to surgery.        2. Pain: the needles have to go through skin and soft tissues, will cause soreness.       3. Damage to internal structures:  The nerves to be lesioned may be near blood vessels or    other nerves which can be potentially damaged.       4. Bleeding: Bleeding is more common if the patient is taking blood thinners such as  aspirin, Coumadin, Ticiid, Plavix, etc., or if he/she have some genetic predisposition  such as hemophilia. Bleeding into the spinal canal can cause compression of the spinal  cord with subsequent paralysis.  This would require an  emergency surgery to  decompress and there are no guarantees that the patient would recover from the  paralysis.       5. Pneumothorax:  Puncturing of a lung is a possibility, every time a needle is introduced in  the area of the chest or upper back.  Pneumothorax refers to free air around the  collapsed lung(s), inside of the thoracic cavity (chest cavity).  Another two possible  complications related to a similar event would include: Hemothorax and Chylothorax.   These are variations of the Pneumothorax, where instead of air around the collapsed  lung(s), you may have blood or chyle, respectively.       6. Spinal headaches: They may occur with any procedures in the area of the spine.       7. Persistent CSF (Cerebro-Spinal Fluid) leakage: This is a rare problem, but may occur  with prolonged intrathecal or epidural catheters either due to the formation of a fistulous  track or a dural tear.       8. Nerve damage: By working so close to the spinal cord, there is always a possibility of  nerve damage, which could be as serious as a permanent spinal cord injury with  paralysis.       9. Death:  Although rare, severe deadly allergic reactions known as "Anaphylactic  reaction" can occur to any of the medications used.      10. Worsening of the symptoms:  We can always make thing worse.    What are the chances of something like this happening? Chances of any of this occuring are extremely low.  By statistics, you have more of a chance of getting killed in a motor vehicle accident: while driving to the hospital than any of the above occurring .  Nevertheless, you should be aware that they are possibilities.  In general, it is similar to taking a shower.  Everybody knows that you can slip, hit your head and get killed.  Does that mean that you should not shower again?  Nevertheless always keep in mind that statistics do not mean anything if you happen to be on the wrong side of them.  Even if a procedure has a 1  (one) in a 1,000,000 (million) chance of going wrong, it you happen to be that one..Also, keep in mind that by statistics, you have more of a chance of having something go wrong when taking medications.  Who should not have this procedure? If you are on a blood thinning medication (e.g. Coumadin, Plavix, see list of "Blood Thinners"), or if you have an active infection going on, you should not have the procedure.  If you are taking any blood thinners, please inform your physician.  How should I prepare for this procedure?  Do not eat or drink anything at least six hours prior to the procedure.  Bring a driver with you .  It cannot be a taxi.  Come accompanied by an adult that can drive you back, and that is strong enough to help you if your legs get weak or numb from the local anesthetic.  Take all of your medicines the morning of the procedure with just enough water to swallow them.  If you have diabetes, make sure that you are scheduled to have your procedure done first thing in the morning, whenever possible.  If you have diabetes, take only half of your insulin dose and notify our nurse that you have done so as soon as you arrive at the clinic.  If you are diabetic, but only take blood sugar pills (oral hypoglycemic), then do not take them on the morning of your procedure.  You may take them after you have had the procedure.  Do not take aspirin or any aspirin-containing medications, at least eleven (11) days prior to the procedure.  They may prolong bleeding.  Wear loose fitting clothing that may be easy to take off and that you would not mind if it got stained with Betadine or blood.  Do not wear any jewelry or perfume  Remove any nail coloring.  It will interfere with some of our monitoring equipment.  NOTE: Remember that this is not meant to be interpreted as a complete list of all possible complications.  Unforeseen problems may occur.  BLOOD THINNERS The following drugs  contain aspirin or other products, which can cause increased bleeding during surgery and should not be taken for 2 weeks prior to and 1 week after surgery.  If you should need take something for relief of minor pain, you may take acetaminophen which is found in Tylenol,m Datril, Anacin-3 and Panadol. It is not blood thinner. The products listed below are.  Do not take any of the products listed below in addition to any listed on your instruction sheet.  A.P.C or A.P.C with Codeine Codeine Phosphate Capsules #3 Ibuprofen Ridaura  ABC compound Congesprin Imuran rimadil  Advil Cope Indocin Robaxisal  Alka-Seltzer Effervescent Pain Reliever and Antacid Coricidin or Coricidin-D  Indomethacin Rufen    Alka-Seltzer plus Cold Medicine Cosprin Ketoprofen S-A-C Tablets  Anacin Analgesic Tablets or Capsules Coumadin Korlgesic Salflex  Anacin Extra Strength Analgesic tablets or capsules CP-2 Tablets Lanoril Salicylate  Anaprox Cuprimine Capsules Levenox Salocol  Anexsia-D Dalteparin Magan Salsalate  Anodynos Darvon compound Magnesium Salicylate Sine-off  Ansaid Dasin Capsules Magsal Sodium Salicylate  Anturane Depen Capsules Marnal Soma  APF Arthritis pain formula Dewitt's Pills Measurin Stanback  Argesic Dia-Gesic Meclofenamic Sulfinpyrazone  Arthritis Bayer Timed Release Aspirin Diclofenac Meclomen Sulindac  Arthritis pain formula Anacin Dicumarol Medipren Supac  Analgesic (Safety coated) Arthralgen Diffunasal Mefanamic Suprofen  Arthritis Strength Bufferin Dihydrocodeine Mepro Compound Suprol  Arthropan liquid Dopirydamole Methcarbomol with Aspirin Synalgos  ASA tablets/Enseals Disalcid Micrainin Tagament  Ascriptin Doan's Midol Talwin  Ascriptin A/D Dolene Mobidin Tanderil  Ascriptin Extra Strength Dolobid Moblgesic Ticlid  Ascriptin with Codeine Doloprin or Doloprin with Codeine Momentum Tolectin  Asperbuf Duoprin Mono-gesic Trendar  Aspergum Duradyne Motrin or Motrin IB Triminicin  Aspirin  plain, buffered or enteric coated Durasal Myochrisine Trigesic  Aspirin Suppositories Easprin Nalfon Trillsate  Aspirin with Codeine Ecotrin Regular or Extra Strength Naprosyn Uracel  Atromid-S Efficin Naproxen Ursinus  Auranofin Capsules Elmiron Neocylate Vanquish  Axotal Emagrin Norgesic Verin  Azathioprine Empirin or Empirin with Codeine Normiflo Vitamin E  Azolid Emprazil Nuprin Voltaren  Bayer Aspirin plain, buffered or children's or timed BC Tablets or powders Encaprin Orgaran Warfarin Sodium  Buff-a-Comp Enoxaparin Orudis Zorpin  Buff-a-Comp with Codeine Equegesic Os-Cal-Gesic   Buffaprin Excedrin plain, buffered or Extra Strength Oxalid   Bufferin Arthritis Strength Feldene Oxphenbutazone   Bufferin plain or Extra Strength Feldene Capsules Oxycodone with Aspirin   Bufferin with Codeine Fenoprofen Fenoprofen Pabalate or Pabalate-SF   Buffets II Flogesic Panagesic   Buffinol plain or Extra Strength Florinal or Florinal with Codeine Panwarfarin   Buf-Tabs Flurbiprofen Penicillamine   Butalbital Compound Four-way cold tablets Penicillin   Butazolidin Fragmin Pepto-Bismol   Carbenicillin Geminisyn Percodan   Carna Arthritis Reliever Geopen Persantine   Carprofen Gold's salt Persistin   Chloramphenicol Goody's Phenylbutazone   Chloromycetin Haltrain Piroxlcam   Clmetidine heparin Plaquenil   Cllnoril Hyco-pap Ponstel   Clofibrate Hydroxy chloroquine Propoxyphen         Before stopping any of these medications, be sure to consult the physician who ordered them.  Some, such as Coumadin (Warfarin) are ordered to prevent or treat serious conditions such as "deep thrombosis", "pumonary embolisms", and other heart problems.  The amount of time that you may need off of the medication may also vary with the medication and the reason for which you were taking it.  If you are taking any of these medications, please make sure you notify your pain physician before you undergo any  procedures.         Radiofrequency Lesioning Radiofrequency lesioning is a procedure that is performed to relieve pain. The procedure is often used for back, neck, or arm pain. Radiofrequency lesioning involves the use of a machine that creates radio waves to make heat. During the procedure, the heat is applied to the nerve that carries the pain signal. The heat damages the nerve and interferes with the pain signal. Pain relief usually lasts for 6 months to 1 year. LET YOUR HEALTH CARE PROVIDER KNOW ABOUT: 2. Any allergies you have. 3. All medicines you are taking, including vitamins, herbs, eye drops, creams, and over-the-counter medicines. 4. Previous problems you or members of your family have had with the use of anesthetics. 5. Any blood disorders you have.   6. Previous surgeries you have had. 7. Any medical conditions you have. 8. Whether you are pregnant or may be pregnant. RISKS AND COMPLICATIONS Generally, this is a safe procedure. However, problems may occur, including:  Pain or soreness at the injection site.  Infection at the injection site.  Damage to nerves or blood vessels. BEFORE THE PROCEDURE  Ask your health care provider about:  Changing or stopping your regular medicines. This is especially important if you are taking diabetes medicines or blood thinners.  Taking medicines such as aspirin and ibuprofen. These medicines can thin your blood. Do not take these medicines before your procedure if your health care provider instructs you not to.  Follow instructions from your health care provider about eating or drinking restrictions.  Plan to have someone take you home after the procedure.  If you go home right after the procedure, plan to have someone with you for 24 hours. PROCEDURE  You will be given one or more of the following:  A medicine to help you relax (sedative).  A medicine to numb the area (local anesthetic).  You will be awake during the  procedure. You will need to be able to talk with the health care provider during the procedure.  With the help of a type of X-ray (fluoroscopy), the health care provider will insert a radiofrequency needle into the area to be treated.  Next, a wire that carries the radio waves (electrode) will be put through the radiofrequency needle. An electrical pulse will be sent through the electrode to verify the correct nerve. You will feel a tingling sensation, and you may have muscle twitching.  Then, the tissue that is around the needle tip will be heated by an electric current that is passed using the radiofrequency machine. This will numb the nerves.  A bandage (dressing) will be put on the insertion area after the procedure is done. The procedure may vary among health care providers and hospitals. AFTER THE PROCEDURE 12. Your blood pressure, heart rate, breathing rate, and blood oxygen level will be monitored often until the medicines you were given have worn off. 13. Return to your normal activities as directed by your health care provider.   This information is not intended to replace advice given to you by your health care provider. Make sure you discuss any questions you have with your health care provider.   Document Released: 09/23/2010 Document Revised: 10/16/2014 Document Reviewed: 03/04/2014 [employer] Interactive Patient Education 2016 [employer]  

## 2015-02-05 NOTE — Progress Notes (Signed)
Safety precautions to be maintained throughout the outpatient stay will include: orient to surroundings, keep bed in low position, maintain call bell within reach at all times, provide assistance with transfer out of bed and ambulation.  

## 2015-02-05 NOTE — Progress Notes (Signed)
Patient's Name: Heather Haley MRN: [id number] DOB: [birth year] DOS: 02/05/2015  Primary Reason(s) for Visit: Post-Procedure evaluation CC: Back Pain   HPI:    Heather Haley is a 54 y.o. year old, female patient, who returns today as an established patient. She has Left ankle pain (work-related injury) (Date of injury 04/17/2006); Other enthesopathy of ankle and tarsus; FOOT PAIN, LEFT; ADVERSE DRUG REACTION; Left knee pain; Hypertension; IBS (irritable bowel syndrome); Postoperative nausea and vomiting; Left ankle instability; Left knee DJD; Depression; Headache(784.0); Morbid obesity ([place]); Hypersomnia; Acute bronchitis; Leg length inequality; Chest pressure; Other and unspecified angina pectoris; Lumbar Degenerative Disc Disease of L3-4 & L4-5; Chronic ankle pain; Encounter for therapeutic drug level monitoring; Encounter for long-term opiate analgesic use; Long-term current use of opiate analgesic; Uncomplicated opioid dependence ([place]); Opiate use; Chronic pain syndrome; Chronic constipation; Personal history of other diseases of the digestive system; Chronic pain following surgery or procedure; Other long term (current) drug therapy; Bilateral peripheral sensory neuropathy; Diabetic peripheral neuropathy ([place]); Generalized anxiety disorder; History of panic attacks; Insomnia; History of tobacco abuse; GERD (gastroesophageal reflux disease); Non-insulin dependent type 2 diabetes mellitus ([place]); Coccygeal pain; History of migraine; Radicular pain of lumbosacral region; Chronic pain of left ankle (secondary to a work-related injury); Chronic low back pain (Location of Primary Pain) (Bilateral) (R>L); Lumbar spondylosis; Lumbar facet syndrome (R>L); and Chronic pain on her problem list.. Her primarily concern today is the Back Pain     The patient returns to the clinic today for postprocedure evaluation after having had a right sided lumbar facet block under fluoroscopic guidance #2. In both  diagnostic injections the patient has obtained 100% relief of the pain for the duration of the local anesthetic. This means semblance of the pain is coming from this area. She indicated that the first injection lasted longer than the second. However, weeks after the second diagnostic injection she is still enjoying 40% relief of her pain. At this point, I would suggest moving on to RF a for longer lasting benefits. The patient has been informed of the plan, its risks, and possible complications. She understood and accepted.  Today's Pain Score: 4 , clinically she looks like a 1-2/10. Reported level of pain is incompatible with clinical obrservations. This may be secondary to a possible lack of understanding on how the pain scale works. Pain Type: Chronic pain Pain Location: Back Pain Orientation: Lower Pain Descriptors / Indicators: Aching, Constant Pain Frequency: Constant  Date of Last Visit: 01/21/15 Service Provided on Last Visit: Procedure (right lumbar facet block)  Pharmacotherapy Review:   Side-effects or Adverse reactions: None reported Effectiveness: Described as relatively effective, allowing for increase in activities of daily living (ADL) Onset of action: Within expected pharmacological parameters Duration of action: Within normal limits for medication Peak effect: Timing and results are as within normal expected parameters [place] PMP: Compliant with practice rules and regulations UDS Results:   UDS Interpretation: Patient appears to be compliant with practice rules and regulations Medication Assessment Form: Reviewed. Patient indicates being compliant with therapy Treatment compliance: Compliant Substance Use Disorder (SUD) Risk Level: Low Pharmacologic Plan: Continue therapy as is  Lab Work: Inflammation [facility]ers Lab Results  Component Value Date   ESRSEDRATE 8 09/14/2013   CRP 0.9* 05/24/2013    Renal Function Lab Results  Component Value Date   BUN 20 10/15/2014    CREATININE 1.04* 10/15/2014   GFRAA >60 10/15/2014   GFRNONAA 59* 10/15/2014    Hepatic Function Lab Results  Component Value  Date   AST 17 10/15/2014   ALT 17 10/15/2014   ALBUMIN 3.7 10/15/2014    Electrolytes Lab Results  Component Value Date   NA 136 10/15/2014   K 4.2 10/15/2014   CL 106 10/15/2014   CALCIUM 9.3 99991111    Illicit Drugs No results found for: THCU, COCAINSCRNUR, PCPSCRNUR, MDMA, AMPHETMU, METHADONE, ETOH   Procedure Assessment:   Procedure done on last visit: Right-sided L2, L3, L4, L5, and S1 medial branch block under fluoroscopic guidance and IV sedation. Side-effects or Adverse reactions: None reported Sedation: Procedure was performed with sedation  Results: Ultra-Short Term Relief (First 1 hour after procedure): 100 %  Possibly the results is influenced by the pharmacodynamic effect of the local anesthetic, as well as that of the intravenous analgesics and/or sedatives, when used Short Term Relief (Initial 4-6 hrs after procedure): 100 % Short-term relief confirms injected site to be the source of pain Long Term Relief : 40 % (was 100% for 2 weeks then has starting returning) Long-term benefit would suggest an inflammatory etiology to the pain   Current Relief (Now):  40%  Persistent relief would suggest effective anti-inflammatory effects from steroids Interpretation of Results: Successful diagnostic lumbar facet block #2 under fluoroscopic guidance. Most pain seems to be coming from this area.  Allergies:  Heather Haley is allergic to codeine; cymbalta; lyrica; morphine and related; nortripytline hcl; nsaids; and penicillins.  Meds:  The patient has a current medication list which includes the following prescription(s): aspirin ec, bisacodyl, bupropion, buspirone, cetirizine, vitamin d, dexlansoprazole, dicyclomine, fluticasone, hydrocodone-acetaminophen, hypromellose, magnesium citrate, metformin, omeprazole, polyethylene  glycol-electrolytes, pravastatin, sumatriptan, topiramate, tramadol, and trazodone. Requested Prescriptions    No prescriptions requested or ordered in this encounter    ROS:  Constitutional: Afebrile, no chills, well hydrated and well nourished Gastrointestinal: negative Musculoskeletal:negative Neurological: negative Behavioral/Psych: negative  PFSH:  Medical:  Heather Haley  has a past medical history of Hypertension; IBS (irritable bowel syndrome); Postoperative nausea and vomiting; Diabetes mellitus ([place]); Left ankle instability; Headache(784.0); Left knee DJD; Depression; Frequency of urination; GERD (gastroesophageal reflux disease); High cholesterol; Neuromuscular disorder ([place]); and Anxiety. Family: family history includes Cancer in her mother; Heart disease in her father; Hypertension in her father. Surgical:  has past surgical history that includes Ankle surgery; Abdominal hysterectomy; Knee arthroscopy (1997); Total knee arthroplasty (08/30/2011); Colonoscopy (2013); Appendectomy; left heart catheterization with coronary angiogram (N/A, 01/11/2013); and Esophagogastroduodenoscopy (egd) with propofol (N/A, 10/03/2014). Tobacco:  reports that she quit smoking about 20 years ago. Her smoking use included Cigarettes. She has a 30 pack-year smoking history. She has never used smokeless tobacco. Alcohol:  reports that she does not drink alcohol. Drug:  reports that she does not use illicit drugs.  Physical Exam:  Vitals:  Today's Vitals   02/05/15 0845 02/05/15 0849  BP: 116/81   Pulse: 71   Temp: 98 F (36.7 C)   Resp: 16   Height: 5\' 5"  (1.651 m)   Weight: 190 lb (86.183 kg)   SpO2: 100%   PainSc: 4  4   PainLoc: Back   Calculated BMI: Body mass index is 31.62 kg/(m^2). General appearance: alert, cooperative, appears older than stated age, no distress and mildly obese Eyes: PERLA Respiratory: No evidence respiratory distress, no audible rales or ronchi and no use of  accessory muscles of respiration Neck: no adenopathy, no carotid bruit, no JVD, supple, symmetrical, trachea midline and thyroid not enlarged, symmetric, no tenderness/mass/nodules  Lumbar Spine ROM: Decreased range of motion due  to guarding. Palpation: No palpable trigger points Lumbar Hyperextension and rotation: Positive bilaterally Patrick's Maneuver: Non-contributory, due to pain from the facets.  Lower Extremities ROM: Adequate bilaterally Strength: 5/5 for all flexors and extensors of the lower extremity, bilaterally Pulses: Palpable bilaterally Neurologic: No allodynia, No hyperesthesia, No hyperpathia, No sensory abnormalities and No antalgic gait or posture  Assessment:  Encounter Diagnosis:  Primary Diagnosis: Chronic pain syndrome [G89.4]  Plan:   Interventional Therapies:  Return to clinic for a right-sided L2, L3, L4, L5, and S1 medial branch RFA under fluoroscopic guidance and IV sedation.  Heather Haley was seen today for back pain.  Diagnoses and all orders for this visit:  Chronic pain syndrome  Chronic low back pain (Location of Primary Pain) (Bilateral) (R>L)  Chronic pain  Lumbar facet syndrome (R>L) -     RADIOFREQUENCY, CERVICAL THORACIC LUMBER, GENICULAR ; Future  Encounter for therapeutic drug level monitoring  Long-term current use of opiate analgesic -     Drugs of abuse screen w/o alc, rtn urine-sln     Patient Instructions   GENERAL RISKS AND COMPLICATIONS  What are the risk, side effects and possible complications? Generally speaking, most procedures are safe.  However, with any procedure there are risks, side effects, and the possibility of complications.  The risks and complications are dependent upon the sites that are lesioned, or the type of nerve block to be performed.  The closer the procedure is to the spine, the more serious the risks are.  Great care is taken when placing the radio frequency needles, block needles or lesioning probes,  but sometimes complications can occur. 1. Infection: Any time there is an injection through the skin, there is a risk of infection.  This is why sterile conditions are used for these blocks.  There are four possible types of infection. 1. Localized skin infection. 2. Central Nervous System Infection-This can be in the form of Meningitis, which can be deadly. 3. Epidural Infections-This can be in the form of an epidural abscess, which can cause pressure inside of the spine, causing compression of the spinal cord with subsequent paralysis. This would require an emergency surgery to decompress, and there are no guarantees that the patient would recover from the paralysis. 4. Discitis-This is an infection of the intervertebral discs.  It occurs in about 1% of discography procedures.  It is difficult to treat and it may lead to surgery.        2. Pain: the needles have to go through skin and soft tissues, will cause soreness.       3. Damage to internal structures:  The nerves to be lesioned may be near blood vessels or    other nerves which can be potentially damaged.       4. Bleeding: Bleeding is more common if the patient is taking blood thinners such as  aspirin, Coumadin, Ticiid, Plavix, etc., or if he/she have some genetic predisposition  such as hemophilia. Bleeding into the spinal canal can cause compression of the spinal  cord with subsequent paralysis.  This would require an emergency surgery to  decompress and there are no guarantees that the patient would recover from the  paralysis.       5. Pneumothorax:  Puncturing of a lung is a possibility, every time a needle is introduced in  the area of the chest or upper back.  Pneumothorax refers to free air around the  collapsed lung(s), inside of the thoracic cavity (chest cavity).  Another  two possible  complications related to a similar event would include: Hemothorax and Chylothorax.   These are variations of the Pneumothorax, where instead of air  around the collapsed  lung(s), you may have blood or chyle, respectively.       6. Spinal headaches: They may occur with any procedures in the area of the spine.       7. Persistent CSF (Cerebro-Spinal Fluid) leakage: This is a rare problem, but may occur  with prolonged intrathecal or epidural catheters either due to the formation of a fistulous  track or a dural tear.       8. Nerve damage: By working so close to the spinal cord, there is always a possibility of  nerve damage, which could be as serious as a permanent spinal cord injury with  paralysis.       9. Death:  Although rare, severe deadly allergic reactions known as "Anaphylactic  reaction" can occur to any of the medications used.      10. Worsening of the symptoms:  We can always make thing worse.  What are the chances of something like this happening? Chances of any of this occuring are extremely low.  By statistics, you have more of a chance of getting killed in a motor vehicle accident: while driving to the hospital than any of the above occurring .  Nevertheless, you should be aware that they are possibilities.  In general, it is similar to taking a shower.  Everybody knows that you can slip, hit your head and get killed.  Does that mean that you should not shower again?  Nevertheless always keep in mind that statistics do not mean anything if you happen to be on the wrong side of them.  Even if a procedure has a 1 (one) in a 1,000,000 (million) chance of going wrong, it you happen to be that one..Also, keep in mind that by statistics, you have more of a chance of having something go wrong when taking medications.  Who should not have this procedure? If you are on a blood thinning medication (e.g. Coumadin, Plavix, see list of "Blood Thinners"), or if you have an active infection going on, you should not have the procedure.  If you are taking any blood thinners, please inform your physician.  How should I prepare for this  procedure?  Do not eat or drink anything at least six hours prior to the procedure.  Bring a driver with you .  It cannot be a taxi.  Come accompanied by an adult that can drive you back, and that is strong enough to help you if your legs get weak or numb from the local anesthetic.  Take all of your medicines the morning of the procedure with just enough water to swallow them.  If you have diabetes, make sure that you are scheduled to have your procedure done first thing in the morning, whenever possible.  If you have diabetes, take only half of your insulin dose and notify our nurse that you have done so as soon as you arrive at the clinic.  If you are diabetic, but only take blood sugar pills (oral hypoglycemic), then do not take them on the morning of your procedure.  You may take them after you have had the procedure.  Do not take aspirin or any aspirin-containing medications, at least eleven (11) days prior to the procedure.  They may prolong bleeding.  Wear loose fitting clothing that may be easy to take  off and that you would not mind if it got stained with Betadine or blood.  Do not wear any jewelry or perfume  Remove any nail coloring.  It will interfere with some of our monitoring equipment.  NOTE: Remember that this is not meant to be interpreted as a complete list of all possible complications.  Unforeseen problems may occur.  BLOOD THINNERS The following drugs contain aspirin or other products, which can cause increased bleeding during surgery and should not be taken for 2 weeks prior to and 1 week after surgery.  If you should need take something for relief of minor pain, you may take acetaminophen which is found in Tylenol,m Datril, Anacin-3 and Panadol. It is not blood thinner. The products listed below are.  Do not take any of the products listed below in addition to any listed on your instruction sheet.  A.P.C or A.P.C with Codeine Codeine Phosphate Capsules #3  Ibuprofen Ridaura  ABC compound Congesprin Imuran rimadil  Advil Cope Indocin Robaxisal  Alka-Seltzer Effervescent Pain Reliever and Antacid Coricidin or Coricidin-D  Indomethacin Rufen  Alka-Seltzer plus Cold Medicine Cosprin Ketoprofen S-A-C Tablets  Anacin Analgesic Tablets or Capsules Coumadin Korlgesic Salflex  Anacin Extra Strength Analgesic tablets or capsules CP-2 Tablets Lanoril Salicylate  Anaprox Cuprimine Capsules Levenox Salocol  Anexsia-D Dalteparin Magan Salsalate  Anodynos Darvon compound Magnesium Salicylate Sine-off  Ansaid Dasin Capsules Magsal Sodium Salicylate  Anturane Depen Capsules Marnal Soma  APF Arthritis pain formula Dewitt's Pills Measurin Stanback  Argesic Dia-Gesic Meclofenamic Sulfinpyrazone  Arthritis Bayer Timed Release Aspirin Diclofenac Meclomen Sulindac  Arthritis pain formula Anacin Dicumarol Medipren Supac  Analgesic (Safety coated) Arthralgen Diffunasal Mefanamic Suprofen  Arthritis Strength Bufferin Dihydrocodeine Mepro Compound Suprol  Arthropan liquid Dopirydamole Methcarbomol with Aspirin Synalgos  ASA tablets/Enseals Disalcid Micrainin Tagament  Ascriptin Doan's Midol Talwin  Ascriptin A/D Dolene Mobidin Tanderil  Ascriptin Extra Strength Dolobid Moblgesic Ticlid  Ascriptin with Codeine Doloprin or Doloprin with Codeine Momentum Tolectin  Asperbuf Duoprin Mono-gesic Trendar  Aspergum Duradyne Motrin or Motrin IB Triminicin  Aspirin plain, buffered or enteric coated Durasal Myochrisine Trigesic  Aspirin Suppositories Easprin Nalfon Trillsate  Aspirin with Codeine Ecotrin Regular or Extra Strength Naprosyn Uracel  Atromid-S Efficin Naproxen Ursinus  Auranofin Capsules Elmiron Neocylate Vanquish  Axotal Emagrin Norgesic Verin  Azathioprine Empirin or Empirin with Codeine Normiflo Vitamin E  Azolid Emprazil Nuprin Voltaren  Bayer Aspirin plain, buffered or children's or timed BC Tablets or powders Encaprin Orgaran Warfarin Sodium   Buff-a-Comp Enoxaparin Orudis Zorpin  Buff-a-Comp with Codeine Equegesic Os-Cal-Gesic   Buffaprin Excedrin plain, buffered or Extra Strength Oxalid   Bufferin Arthritis Strength Feldene Oxphenbutazone   Bufferin plain or Extra Strength Feldene Capsules Oxycodone with Aspirin   Bufferin with Codeine Fenoprofen Fenoprofen Pabalate or Pabalate-SF   Buffets II Flogesic Panagesic   Buffinol plain or Extra Strength Florinal or Florinal with Codeine Panwarfarin   Buf-Tabs Flurbiprofen Penicillamine   Butalbital Compound Four-way cold tablets Penicillin   Butazolidin Fragmin Pepto-Bismol   Carbenicillin Geminisyn Percodan   Carna Arthritis Reliever Geopen Persantine   Carprofen Gold's salt Persistin   Chloramphenicol Goody's Phenylbutazone   Chloromycetin Haltrain Piroxlcam   Clmetidine heparin Plaquenil   Cllnoril Hyco-pap Ponstel   Clofibrate Hydroxy chloroquine Propoxyphen         Before stopping any of these medications, be sure to consult the physician who ordered them.  Some, such as Coumadin (Warfarin) are ordered to prevent or treat serious conditions such as "deep thrombosis", "pumonary embolisms",  and other heart problems.  The amount of time that you may need off of the medication may also vary with the medication and the reason for which you were taking it.  If you are taking any of these medications, please make sure you notify your pain physician before you undergo any procedures.         Radiofrequency Lesioning Radiofrequency lesioning is a procedure that is performed to relieve pain. The procedure is often used for back, neck, or arm pain. Radiofrequency lesioning involves the use of a machine that creates radio waves to make heat. During the procedure, the heat is applied to the nerve that carries the pain signal. The heat damages the nerve and interferes with the pain signal. Pain relief usually lasts for 6 months to 1 year. LET [facility] CARE PROVIDER KNOW  ABOUT: 2. Any allergies you have. 3. All medicines you are taking, including vitamins, herbs, eye drops, creams, and over-the-counter medicines. 4. Previous problems you or members of your family have had with the use of anesthetics. 5. Any blood disorders you have. 6. Previous surgeries you have had. 7. Any medical conditions you have. 8. Whether you are pregnant or may be pregnant. RISKS AND COMPLICATIONS Generally, this is a safe procedure. However, problems may occur, including:  Pain or soreness at the injection site.  Infection at the injection site.  Damage to nerves or blood vessels. BEFORE THE PROCEDURE  Ask your health care provider about:  Changing or stopping your regular medicines. This is especially important if you are taking diabetes medicines or blood thinners.  Taking medicines such as aspirin and ibuprofen. These medicines can thin your blood. Do not take these medicines before your procedure if your health care provider instructs you not to.  Follow instructions from your health care provider about eating or drinking restrictions.  Plan to have someone take you home after the procedure.  If you go home right after the procedure, plan to have someone with you for 24 hours. PROCEDURE  You will be given one or more of the following:  A medicine to help you relax (sedative).  A medicine to numb the area (local anesthetic).  You will be awake during the procedure. You will need to be able to talk with the health care provider during the procedure.  With the help of a type of X-ray (fluoroscopy), the health care provider will insert a radiofrequency needle into the area to be treated.  Next, a wire that carries the radio waves (electrode) will be put through the radiofrequency needle. An electrical pulse will be sent through the electrode to verify the correct nerve. You will feel a tingling sensation, and you may have muscle twitching.  Then, the tissue that  is around the needle tip will be heated by an electric current that is passed using the radiofrequency machine. This will numb the nerves.  A bandage (dressing) will be put on the insertion area after the procedure is done. The procedure may vary among health care providers and hospitals. AFTER THE PROCEDURE 12. Your blood pressure, heart rate, breathing rate, and blood oxygen level will be monitored often until the medicines you were given have worn off. 13. Return to your normal activities as directed by your health care provider.   This information is not intended to replace advice given to you by your health care provider. Make sure you discuss any questions you have with your health care provider.   Document Released: 09/23/2010  Document Revised: 10/16/2014 Document Reviewed: 03/04/2014 Elsevier Interactive Patient Education 2016 [employer].   Medications discontinued today:  Medications Discontinued During This Encounter  Medication Reason  . calcium carbonate (OS-CAL - DOSED IN MG OF ELEMENTAL CALCIUM) 1250 (500 Ca) MG tablet Error  . lidocaine (PF) (XYLOCAINE) 1 % injection 10 mL Error   Medications administered today:  Heather Haley had no medications administered during this visit.  Primary Care Physician: Shamleffer, Herschell Dimes, MD Location: [facility] Note by: Kathlen Brunswick. Dossie Arbour, M.D, DABA, DABAPM, DABPM, DABIPP, FIPP

## 2015-02-19 ENCOUNTER — Other Ambulatory Visit: Payer: Self-pay | Admitting: Pain Medicine

## 2015-02-19 ENCOUNTER — Encounter: Payer: Self-pay | Admitting: Pain Medicine

## 2015-02-19 ENCOUNTER — Ambulatory Visit: Payer: Medicare Other | Attending: Pain Medicine | Admitting: Pain Medicine

## 2015-02-19 VITALS — BP 124/86 | HR 80 | Temp 97.6°F | Resp 16 | Ht 65.0 in | Wt 190.0 lb

## 2015-02-19 DIAGNOSIS — Z87891 Personal history of nicotine dependence: Secondary | ICD-10-CM | POA: Insufficient documentation

## 2015-02-19 DIAGNOSIS — G8929 Other chronic pain: Secondary | ICD-10-CM

## 2015-02-19 DIAGNOSIS — M25572 Pain in left ankle and joints of left foot: Secondary | ICD-10-CM

## 2015-02-19 DIAGNOSIS — K589 Irritable bowel syndrome without diarrhea: Secondary | ICD-10-CM | POA: Insufficient documentation

## 2015-02-19 DIAGNOSIS — E119 Type 2 diabetes mellitus without complications: Secondary | ICD-10-CM | POA: Insufficient documentation

## 2015-02-19 DIAGNOSIS — F329 Major depressive disorder, single episode, unspecified: Secondary | ICD-10-CM | POA: Insufficient documentation

## 2015-02-19 DIAGNOSIS — F1190 Opioid use, unspecified, uncomplicated: Secondary | ICD-10-CM | POA: Diagnosis not present

## 2015-02-19 DIAGNOSIS — M545 Low back pain: Secondary | ICD-10-CM

## 2015-02-19 DIAGNOSIS — M79673 Pain in unspecified foot: Secondary | ICD-10-CM | POA: Insufficient documentation

## 2015-02-19 DIAGNOSIS — M47816 Spondylosis without myelopathy or radiculopathy, lumbar region: Secondary | ICD-10-CM

## 2015-02-19 DIAGNOSIS — K219 Gastro-esophageal reflux disease without esophagitis: Secondary | ICD-10-CM | POA: Insufficient documentation

## 2015-02-19 DIAGNOSIS — G894 Chronic pain syndrome: Secondary | ICD-10-CM

## 2015-02-19 DIAGNOSIS — I10 Essential (primary) hypertension: Secondary | ICD-10-CM | POA: Insufficient documentation

## 2015-02-19 DIAGNOSIS — M533 Sacrococcygeal disorders, not elsewhere classified: Secondary | ICD-10-CM | POA: Insufficient documentation

## 2015-02-19 DIAGNOSIS — M25569 Pain in unspecified knee: Secondary | ICD-10-CM | POA: Diagnosis present

## 2015-02-19 DIAGNOSIS — Z79891 Long term (current) use of opiate analgesic: Secondary | ICD-10-CM | POA: Diagnosis not present

## 2015-02-19 DIAGNOSIS — F419 Anxiety disorder, unspecified: Secondary | ICD-10-CM | POA: Insufficient documentation

## 2015-02-19 MED ORDER — HYDROCODONE-ACETAMINOPHEN 5-325 MG PO TABS: 1.0000 | ORAL_TABLET | Freq: Every day | ORAL | Status: DC | PRN

## 2015-02-19 MED ORDER — TRAMADOL HCL 50 MG PO TABS: 100.0000 mg | ORAL_TABLET | Freq: Three times a day (TID) | ORAL | Status: DC | PRN

## 2015-02-19 NOTE — Patient Instructions (Signed)
A prescription for TRAMADOL AND HYDROCODONE was given to you today.

## 2015-02-19 NOTE — Progress Notes (Signed)
Safety precautions to be maintained throughout the outpatient stay will include: orient to surroundings, keep bed in low position, maintain call bell within reach at all times, provide assistance with transfer out of bed and ambulation. Hydrocodone pill count # 24 Tramadol pill count # 117

## 2015-02-19 NOTE — Progress Notes (Signed)
Patient's Name: Heather Haley MRN: [id number] DOB: 1960-11-08 DOS: 02/19/2015  Primary Reason(s) for Visit: Encounter for Medication Management CC: Knee Pain and Foot Pain   HPI:    Heather Haley is a 55 y.o. year old, female patient, who returns today as an established patient. She has Left ankle pain (work-related injury) (Date of injury 04/17/2006); Other enthesopathy of ankle and tarsus; FOOT PAIN, LEFT; ADVERSE DRUG REACTION; Left knee pain; Hypertension; IBS (irritable bowel syndrome); Postoperative nausea and vomiting; Left ankle instability; Left knee DJD; Depression; Headache(784.0); Morbid obesity ([place]); Hypersomnia; Acute bronchitis; Leg length inequality; Chest pressure; Other and unspecified angina pectoris; Lumbar Degenerative Disc Disease of L3-4 & L4-5; Chronic ankle pain; Encounter for therapeutic drug level monitoring; Encounter for long-term opiate analgesic use; Long-term current use of opiate analgesic; Uncomplicated opioid dependence ([place]); Opiate use; Chronic pain syndrome; Chronic constipation; Personal history of other diseases of the digestive system; Chronic pain following surgery or procedure; Other long term (current) drug therapy; Bilateral peripheral sensory neuropathy; Diabetic peripheral neuropathy ([place]); Generalized anxiety disorder; History of panic attacks; Insomnia; History of tobacco abuse; GERD (gastroesophageal reflux disease); Non-insulin dependent type 2 diabetes mellitus ([place]); Coccygeal pain; History of migraine; Radicular pain of lumbosacral region; Chronic pain of left ankle (secondary to a work-related injury); Chronic low back pain (Location of Primary Pain) (Bilateral) (R>L); Lumbar spondylosis; Lumbar facet syndrome (R>L); and Chronic pain on Heather Haley problem list.. Heather Haley primarily concern today is the Knee Pain and Foot Pain     The patient returns to the clinic today for pharmacological management of Heather Haley chronic pain. Unfortunately, there was an issue with  Heather Haley prescriptions and they did not flow correctly as they should've. Today we will rewrite Heather Haley prescriptions so that she doesn't have any problems with that. We'll see Heather Haley back in 3 months. She is doing well and reports no problems with the medicines.  Today's Pain Score: 5 , clinically she looks like a 1/10. Reported level of pain is incompatible with clinical obrservations. This may be secondary to a possible lack of understanding on how the pain scale works. Pain Type: Chronic pain Pain Location: Back (knee) Pain Orientation: Left Pain Descriptors / Indicators: Aching, Constant Pain Frequency: Constant  Date of Last Visit: 02/05/15 Service Provided on Last Visit: Evaluation  Pharmacotherapy Review:   Side-effects or Adverse reactions: None reported Effectiveness: Described as relatively effective, allowing for increase in activities of daily living (ADL) Onset of action: Within expected pharmacological parameters Duration of action: Within normal limits for medication Peak effect: Timing and results are as within normal expected parameters [place] PMP: Compliant with practice rules and regulations UDS Results: Compliant UDS Interpretation: Patient appears to be compliant with practice rules and regulations Medication Assessment Form: Reviewed. Patient indicates being compliant with therapy Treatment compliance: Compliant Substance Use Disorder (SUD) Risk Level: Low Pharmacologic Plan: Continue therapy as is  Lab Work: Illicit Drugs No results found for: THCU, COCAINSCRNUR, PCPSCRNUR, MDMA, AMPHETMU, METHADONE, ETOH  Inflammation Markers Lab Results  Component Value Date   ESRSEDRATE 8 09/14/2013   CRP 0.9* 05/24/2013    Renal Function Lab Results  Component Value Date   BUN 20 10/15/2014   CREATININE 1.04* 10/15/2014   GFRAA >60 10/15/2014   GFRNONAA 59* 10/15/2014    Hepatic Function Lab Results  Component Value Date   AST 17 10/15/2014   ALT 17 10/15/2014   ALBUMIN  3.7 10/15/2014    Electrolytes Lab Results  Component Value Date   NA 136 10/15/2014  K 4.2 10/15/2014   CL 106 10/15/2014   CALCIUM 9.3 10/15/2014   MG 2.0 03/12/2014    Allergies:  Heather Haley is allergic to codeine; cymbalta; duloxetine; gabapentin; lyrica; morphine and related; nortripytline hcl; nsaids; and penicillins.  Meds:  The patient has a current medication list which includes the following prescription(s): aspirin ec, bisacodyl, bupropion, buspirone, cetirizine, vitamin d, fluticasone, hydrocodone-acetaminophen, hypromellose, lactulose, metformin, mirabegron er, omeprazole, phenazopyridine, pravastatin, ranitidine, sumatriptan, topiramate, tramadol, trazodone, hydrocodone-acetaminophen, and hydrocodone-acetaminophen. Requested Prescriptions   Signed Prescriptions Disp Refills  . HYDROcodone-acetaminophen (NORCO/VICODIN) 5-325 MG tablet 30 tablet 0    Sig: Take 1 tablet by mouth daily as needed for moderate pain or severe pain.  Marland Kitchen HYDROcodone-acetaminophen (NORCO/VICODIN) 5-325 MG tablet 30 tablet 0    Sig: Take 1 tablet by mouth daily as needed for moderate pain or severe pain.  Marland Kitchen HYDROcodone-acetaminophen (NORCO/VICODIN) 5-325 MG tablet 30 tablet 0    Sig: Take 1 tablet by mouth daily as needed for moderate pain or severe pain.  . traMADol (ULTRAM) 50 MG tablet 180 tablet 2    Sig: Take 2 tablets (100 mg total) by mouth every 8 (eight) hours as needed for moderate pain or severe pain.    ROS:  Constitutional: Afebrile, no chills, well hydrated and well nourished Gastrointestinal: negative Musculoskeletal:negative Neurological: negative Behavioral/Psych: negative  PFSH:  Medical:  Heather Haley  has a past medical history of Hypertension; IBS (irritable bowel syndrome); Postoperative nausea and vomiting; Diabetes mellitus ([place]); Left ankle instability; Headache(784.0); Left knee DJD; Depression; Frequency of urination; GERD (gastroesophageal reflux disease);  High cholesterol; Neuromuscular disorder ([place]); and Anxiety. Family: family history includes Cancer in Heather Haley mother; Heart disease in Heather Haley father; Hypertension in Heather Haley father. Surgical:  has past surgical history that includes Ankle surgery; Abdominal hysterectomy; Knee arthroscopy (1997); Total knee arthroplasty (08/30/2011); Colonoscopy (2013); Appendectomy; left heart catheterization with coronary angiogram (N/A, 01/11/2013); and Esophagogastroduodenoscopy (egd) with propofol (N/A, 10/03/2014). Tobacco:  reports that she quit smoking about 20 years ago. Heather Haley smoking use included Cigarettes. She has a 30 pack-year smoking history. She has never used smokeless tobacco. Alcohol:  reports that she does not drink alcohol. Drug:  reports that she does not use illicit drugs.  Physical Exam:  Vitals:  Today's Vitals   02/19/15 1359 02/19/15 1401  BP:  124/86  Pulse: 80   Temp: 97.6 F (36.4 C)   Resp: 16   Height: 5\' 5"  (1.651 m)   Weight: 190 lb (86.183 kg)   SpO2: 99%   PainSc: 5  5   PainLoc: Foot   Calculated BMI: Body mass index is 31.62 kg/(m^2). General appearance: alert, cooperative, appears older than stated age, no distress and moderately obese Eyes: PERLA Respiratory: No evidence respiratory distress, no audible rales or ronchi and no use of accessory muscles of respiration Neck: no adenopathy, no carotid bruit, no JVD, supple, symmetrical, trachea midline and thyroid not enlarged, symmetric, no tenderness/mass/nodules  Cervical Spine ROM: Adequate for flexion, extension, rotation, and lateral bending Palpation: No palpable trigger points  Upper Extremities ROM: Adequate bilaterally Strength: 5/5 for all flexors and extensors of the upper extremity, bilaterally Pulses: Palpable bilaterally Neurologic: No allodynia, No hyperesthesia, No hyperpathia and No sensory abnormalities  Lumbar Spine ROM: Adequate for flexion, extension, rotation, and lateral bending Palpation: No palpable  trigger points Lumbar Hyperextension and rotation: Right side positive Patrick's Maneuver: Non-contributory  Lower Extremities ROM: Adequate bilaterally Strength: 5/5 for all flexors and extensors of the lower extremity, bilaterally Pulses: Palpable  bilaterally Neurologic: No allodynia, No hyperesthesia, No hyperpathia, No sensory abnormalities and No antalgic gait or posture  Assessment:  Encounter Diagnosis:  Primary Diagnosis: Chronic pain [G89.29]  Plan:   Interventional Therapies: PRN procedure: Palliative right lumbar facet block under fluoroscopic guidance and IV sedation.    Kinza was seen today for knee pain and foot pain.  Diagnoses and all orders for this visit:  Chronic pain -     Discontinue: HYDROcodone-acetaminophen (NORCO/VICODIN) 5-325 MG tablet; Take 1 tablet by mouth daily as needed for moderate pain or severe pain. -     Discontinue: traMADol (ULTRAM) 50 MG tablet; Take 2 tablets (100 mg total) by mouth every 8 (eight) hours as needed for moderate pain or severe pain. -     Discontinue: HYDROcodone-acetaminophen (NORCO/VICODIN) 5-325 MG tablet; Take 1 tablet by mouth daily as needed for moderate pain or severe pain. -     Discontinue: HYDROcodone-acetaminophen (NORCO/VICODIN) 5-325 MG tablet; Take 1 tablet by mouth daily as needed for moderate pain or severe pain. -     HYDROcodone-acetaminophen (NORCO/VICODIN) 5-325 MG tablet; Take 1 tablet by mouth daily as needed for moderate pain or severe pain. -     HYDROcodone-acetaminophen (NORCO/VICODIN) 5-325 MG tablet; Take 1 tablet by mouth daily as needed for moderate pain or severe pain. -     HYDROcodone-acetaminophen (NORCO/VICODIN) 5-325 MG tablet; Take 1 tablet by mouth daily as needed for moderate pain or severe pain. -     traMADol (ULTRAM) 50 MG tablet; Take 2 tablets (100 mg total) by mouth every 8 (eight) hours as needed for moderate pain or severe pain.  Encounter for long-term opiate analgesic  use  Long-term current use of opiate analgesic  Chronic pain of left ankle (secondary to a work-related injury)  Chronic pain syndrome  Lumbar facet syndrome (R>L) -     LUMBAR FACET(MEDIAL BRANCH NERVE BLOCK) MBNB; Standing     Patient Instructions  A prescription for TRAMADOL AND HYDROCODONE was given to you today.   Medications discontinued today:  Medications Discontinued During This Encounter  Medication Reason  . Dexlansoprazole (DEXILANT) 30 MG capsule Error  . dicyclomine (BENTYL) 20 MG tablet Error  . magnesium citrate SOLN Error  . polyethylene glycol-electrolytes (NULYTELY/GOLYTELY) 420 g solution Error  . HYDROcodone-acetaminophen (NORCO/VICODIN) 5-325 MG tablet Reorder  . traMADol (ULTRAM) 50 MG tablet Reorder  . HYDROcodone-acetaminophen (NORCO/VICODIN) 5-325 MG tablet Reorder  . HYDROcodone-acetaminophen (NORCO/VICODIN) 5-325 MG tablet Reorder  . HYDROcodone-acetaminophen (NORCO/VICODIN) 5-325 MG tablet Reorder  . traMADol (ULTRAM) 50 MG tablet Reorder   Medications administered today:  Ms. Balian had no medications administered during this visit.  Primary Care Physician: Shamleffer, Herschell Dimes, MD Location: [facility] Note by: Kathlen Brunswick. Dossie Arbour, M.D, DABA, DABAPM, DABPM, DABIPP, FIPP

## 2015-02-28 LAB — TOXASSURE SELECT 13 (MW), URINE: PDF: 0

## 2015-03-26 ENCOUNTER — Other Ambulatory Visit: Payer: Self-pay | Admitting: Internal Medicine

## 2015-03-26 DIAGNOSIS — N6320 Unspecified lump in the left breast, unspecified quadrant: Principal | ICD-10-CM

## 2015-03-26 DIAGNOSIS — N6325 Unspecified lump in the left breast, overlapping quadrants: Secondary | ICD-10-CM

## 2015-03-26 DIAGNOSIS — N644 Mastodynia: Secondary | ICD-10-CM

## 2015-03-31 ENCOUNTER — Ambulatory Visit
Admission: RE | Admit: 2015-03-31 | Discharge: 2015-03-31 | Disposition: A | Discharge status: Home / Self Care | Payer: Medicare Other | Source: Ambulatory Visit | Attending: Internal Medicine | Admitting: Internal Medicine

## 2015-03-31 DIAGNOSIS — N6320 Unspecified lump in the left breast, unspecified quadrant: Principal | ICD-10-CM

## 2015-03-31 DIAGNOSIS — N644 Mastodynia: Secondary | ICD-10-CM

## 2015-03-31 DIAGNOSIS — N6325 Unspecified lump in the left breast, overlapping quadrants: Secondary | ICD-10-CM

## 2015-04-21 DIAGNOSIS — G4485 Primary stabbing headache: Secondary | ICD-10-CM | POA: Insufficient documentation

## 2015-05-05 ENCOUNTER — Ambulatory Visit
Admission: RE | Admit: 2015-05-05 | Discharge: 2015-05-05 | Disposition: A | Discharge status: Home / Self Care | Payer: Medicare Other | Source: Ambulatory Visit | Attending: Pain Medicine | Admitting: Pain Medicine

## 2015-05-05 ENCOUNTER — Encounter: Payer: Self-pay | Admitting: Pain Medicine

## 2015-05-05 ENCOUNTER — Ambulatory Visit (HOSPITAL_BASED_OUTPATIENT_CLINIC_OR_DEPARTMENT_OTHER): Payer: Medicare Other | Admitting: Pain Medicine

## 2015-05-05 VITALS — BP 130/73 | HR 91 | Temp 97.8°F | Resp 16 | Ht 65.0 in | Wt 185.0 lb

## 2015-05-05 DIAGNOSIS — Z79891 Long term (current) use of opiate analgesic: Secondary | ICD-10-CM

## 2015-05-05 DIAGNOSIS — M25562 Pain in left knee: Secondary | ICD-10-CM

## 2015-05-05 DIAGNOSIS — Z96652 Presence of left artificial knee joint: Secondary | ICD-10-CM

## 2015-05-05 DIAGNOSIS — M25572 Pain in left ankle and joints of left foot: Secondary | ICD-10-CM

## 2015-05-05 DIAGNOSIS — M25551 Pain in right hip: Secondary | ICD-10-CM

## 2015-05-05 DIAGNOSIS — M1611 Unilateral primary osteoarthritis, right hip: Secondary | ICD-10-CM

## 2015-05-05 DIAGNOSIS — M25552 Pain in left hip: Secondary | ICD-10-CM | POA: Diagnosis not present

## 2015-05-05 DIAGNOSIS — Z79899 Other long term (current) drug therapy: Secondary | ICD-10-CM

## 2015-05-05 DIAGNOSIS — R1031 Right lower quadrant pain: Secondary | ICD-10-CM

## 2015-05-05 DIAGNOSIS — R109 Unspecified abdominal pain: Secondary | ICD-10-CM

## 2015-05-05 DIAGNOSIS — M47816 Spondylosis without myelopathy or radiculopathy, lumbar region: Secondary | ICD-10-CM

## 2015-05-05 DIAGNOSIS — G8929 Other chronic pain: Secondary | ICD-10-CM | POA: Diagnosis not present

## 2015-05-05 DIAGNOSIS — M79606 Pain in leg, unspecified: Secondary | ICD-10-CM

## 2015-05-05 DIAGNOSIS — M545 Low back pain, unspecified: Secondary | ICD-10-CM

## 2015-05-05 DIAGNOSIS — M51369 Other intervertebral disc degeneration, lumbar region without mention of lumbar back pain or lower extremity pain: Secondary | ICD-10-CM

## 2015-05-05 DIAGNOSIS — Z96659 Presence of unspecified artificial knee joint: Secondary | ICD-10-CM | POA: Insufficient documentation

## 2015-05-05 DIAGNOSIS — M1712 Unilateral primary osteoarthritis, left knee: Secondary | ICD-10-CM

## 2015-05-05 DIAGNOSIS — M160 Bilateral primary osteoarthritis of hip: Secondary | ICD-10-CM

## 2015-05-05 DIAGNOSIS — M5136 Other intervertebral disc degeneration, lumbar region: Secondary | ICD-10-CM

## 2015-05-05 HISTORY — DX: Unilateral primary osteoarthritis, right hip: M16.11

## 2015-05-05 NOTE — Assessment & Plan Note (Signed)
This was confirmed via 2 diagnostic lumbar facet blocks under fluoroscopic guidance and IV sedation with 100% relief of the pain for the duration of local anesthetic. In the case of the second block this 100% relief lasted 2 weeks past the duration of the local anesthetic. At this point, we believe the patient to be a good candidate for radiofrequency ablation.
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
## 2015-05-05 NOTE — Patient Instructions (Signed)
Instructed to go get xray as soon as possible in the medical mall

## 2015-05-05 NOTE — Assessment & Plan Note (Signed)
The plan here is to do a diagnostic genicular nerve block under fluoroscopic guidance. If this is effective, the patient may be a good candidate for radiofrequency ablation.

## 2015-05-05 NOTE — Assessment & Plan Note (Addendum)
X-rays of the right hip confirm the existence of osteoarthritis of the hip. The plan is to bring the patient back for a diagnostic intra-articular hip injection under fluoroscopic guidance.

## 2015-05-05 NOTE — Progress Notes (Signed)
Safety precautions to be maintained throughout the outpatient stay will include: orient to surroundings, keep bed in low position, maintain call bell within reach at all times, provide assistance with transfer out of bed and ambulation.  Patient  Did not bring any medications to this visit.

## 2015-05-08 ENCOUNTER — Encounter: Payer: Self-pay | Admitting: Pain Medicine

## 2015-05-08 ENCOUNTER — Ambulatory Visit: Payer: Medicare Other | Attending: Pain Medicine | Admitting: Pain Medicine

## 2015-05-08 VITALS — BP 137/82 | HR 93 | Temp 97.9°F | Resp 16 | Ht 65.0 in | Wt 185.0 lb

## 2015-05-08 DIAGNOSIS — M25551 Pain in right hip: Secondary | ICD-10-CM

## 2015-05-08 DIAGNOSIS — M1611 Unilateral primary osteoarthritis, right hip: Secondary | ICD-10-CM | POA: Insufficient documentation

## 2015-05-08 DIAGNOSIS — M160 Bilateral primary osteoarthritis of hip: Secondary | ICD-10-CM | POA: Diagnosis not present

## 2015-05-08 DIAGNOSIS — G8929 Other chronic pain: Secondary | ICD-10-CM | POA: Diagnosis not present

## 2015-05-08 MED ORDER — IOPAMIDOL (ISOVUE-M 200) INJECTION 41%: 10.0000 mL | Freq: Once | INTRAMUSCULAR | Status: DC | PRN

## 2015-05-08 MED ORDER — LIDOCAINE HCL (PF) 1 % IJ SOLN: 10.0000 mL | Freq: Once | INTRAMUSCULAR | Status: DC

## 2015-05-08 MED ORDER — METHYLPREDNISOLONE ACETATE 80 MG/ML IJ SUSP
80.0000 mg | Freq: Once | INTRAMUSCULAR | Status: DC
Start: 2015-05-08 — End: 2015-05-08

## 2015-05-08 MED ORDER — ROPIVACAINE HCL 2 MG/ML IJ SOLN: 4.0000 mL | Freq: Once | INTRAMUSCULAR | Status: DC

## 2015-05-08 NOTE — Progress Notes (Signed)
Patient's Name: ASENET CASIANO MRN: [id number] DOB: May 17, 1960 DOS: 05/08/2015  Primary Reason(s) for Visit: Initially the patient came in for a diagnostic/therapeutic intra-articular right hip injection under fluoroscopic guidance, however this was switched to a visit to discuss her care. CC: Hip Pain   HPI  Ms. Salameh is a 55 y.o. year old, female patient, who returns today as an established patient. She has ADVERSE DRUG REACTION; Hypertension; IBS (irritable bowel syndrome); Depression; Morbid obesity ([place]); Hypersomnia; Leg length inequality; Chest pressure; Other and unspecified angina pectoris; Encounter for therapeutic drug level monitoring; Encounter for long-term opiate analgesic use; Long-term current use of opiate analgesic; Uncomplicated opioid dependence ([place]); Opiate use (35 MME/Day); Chronic pain syndrome; Chronic constipation; Personal history of other diseases of the digestive system; Diabetic sensory peripheral neuropathy (Bilateral Lower Extremity); Generalized anxiety disorder; History of panic attacks; Insomnia; History of tobacco abuse; GERD (gastroesophageal reflux disease); Non-insulin dependent type 2 diabetes mellitus ([place]); Coccygeal pain; History of migraine; Chronic ankle pain (secondary to a work-related injury) (Date of injury 04/17/2006) (Left); Chronic low back pain (Location of Primary Pain) (Bilateral) (R>L); Lumbar spondylosis (L3-4 & L4-5); Lumbar facet syndrome (Location of Primary Source of Pain) (Bilateral) (R>L); Chronic pain; Headache, primary stabbing; Chronic hip pain (Right); Long term prescription opiate use; Chronic knee pain (S/P TKR:Total Knee Replacement) (Left); Osteoarthritis of hips (Location of Secondary source of pain) (Bilateral) (Right); History of total knee replacement (Left); Chronic groin pain (Location of Secondary source of pain) (Right); Chronic lower extremity pain (Location of Tertiary source of pain) (Bilateral) (R>L); and  Osteoarthritis of hip (Right) on her problem list.. Her primarily concern today is the Hip Pain   The purpose of today's visit was to do a diagnostic/therapeutic intra-articular hip injection on the right side. As I walked into the room to do my preprocedure assessment and talked to the patient, I noticed that she was unusually quiet and she simply looked like she was not happy with me. Because of this I directly asked her if there was any problems that she needed to discuss and this unleashed a long conversation about our last visit. Since I went into the room to mark the right hip, I had one of my nurses with me present during the entire ordeal. I do realize that this patient has a great deal of psychological and psychiatric issues that, as a consequence of her unfortunate experiences in life, however this tends to be a very repetitive issue with this patient and it is one that is not allowing me to effectively and efficiently treat her pain. Although I sincerely would love to help her, these type of encounters simply drain my energy. I will leave the details of the discussion to be explained by the nurses that were there with me. I have offered this patient to be referred elsewhere and she keeps insisting that she wants to stay with me. I am afraid that if this keeps going on I will simply have to take the initiative of letting her go. I am concerned that she has shared with me in multiple locations that she has lied to my nursing staff when they ask her things that are sensitive to her and that she doesn't want anybody else to know them except for her physician. The problem with this is that this creates a record that is inconsistent between the information that has been input by my nursing staff and the information that I have put in.  Today she agreed that she had  taken too much my time and decided that she wanted to reschedule the procedure, which I told her that I would agree with that as well. She  asked me about the results of her recent MRI, which I initially took it to be the x-rays of her hip and pelvis that I had ordered on 05/05/2015. I went over those and explained everything for her. She then again mentioned the MRI and asked me if I had read the notes from Dr. Mosetta Anis? I told her that I had not but then I looked it up with her in "Care Everywhere" and I found those notes. The MRI that she was referring to was a brain MRI that I had not ordered and I had no idea why it was ordered. Anyway, trying to be nicely with her high read her the results but I made it clear that this was a test done for something that is outside of my realm of expertise and that perhaps she should call over this with the ordering physician.  I asked her if she had any further questions that I could help her with and she got up and said that she had taken enough my time and left. At this point, I have to admit that with this patient I simply do not have the ability to read her and therefore I have no idea if she was satisfied with my care in the time that I had spent with her. Unfortunately, because she apparently has the habit of leaving the appointments while she still has issues and questions, I do not know with she is given a come back happy or again dissatisfied with my care.  Pain Assessment: Self-Reported Pain Score: 5  Reported level is compatible with observation Pain Type: Chronic pain Pain Location: Hip Pain Orientation: Right Pain Descriptors / Indicators: Constant, Shooting, Throbbing Pain Frequency: Constant  Date of Last Visit: 05/05/15 Service Provided on Last Visit: Med Refill  Laboratory Workup   Inflammation Markers Lab Results  Component Value Date   ESRSEDRATE 8 09/14/2013   CRP 0.9* 05/24/2013    Renal Function Lab Results  Component Value Date   BUN 20 10/15/2014   CREATININE 1.04* 10/15/2014   GFRAA >60 10/15/2014   GFRNONAA 59* 10/15/2014    Hepatic Function Lab  Results  Component Value Date   AST 17 10/15/2014   ALT 17 10/15/2014   ALBUMIN 3.7 10/15/2014    Electrolytes Lab Results  Component Value Date   NA 136 10/15/2014   K 4.2 10/15/2014   CL 106 10/15/2014   CALCIUM 9.3 10/15/2014   MG 2.0 03/12/2014   Allergies  Ms. Jarecki is allergic to buprenorphine hcl; codeine; cymbalta; duloxetine; gabapentin; lyrica; morphine and related; nortripytline hcl; nsaids; penicillin g; penicillins; and tolmetin.  Meds  The patient has a current medication list which includes the following prescription(s): aspirin ec, bisacodyl, bupropion, buspirone, cetirizine, vitamin d, clindamycin, docusate sodium, fluticasone, hydrocodone-acetaminophen, hydrocodone-acetaminophen, hydrocodone-acetaminophen, hypromellose, lactulose, metformin, mirabegron er, omeprazole, pravastatin, prednisone, ranitidine, sumatriptan, topiramate, tramadol, trazodone, and verapamil hcl.  Current Outpatient Prescriptions on File Prior to Visit  Medication Sig  . aspirin EC 81 MG tablet Take 81 mg by mouth daily.  . bisacodyl (DULCOLAX) 5 MG EC tablet Take 5 mg by mouth daily as needed for moderate constipation.  Marland Kitchen buPROPion (WELLBUTRIN XL) 150 MG 24 hr tablet Take 300 mg by mouth daily.   . busPIRone (BUSPAR) 5 MG tablet Take 5 mg by mouth at bedtime.   Marland Kitchen  cetirizine (ZYRTEC) 10 MG tablet Take 1 tablet (10 mg total) by mouth at bedtime.  . Cholecalciferol (VITAMIN D) 2000 UNITS CAPS Take 2,000 Units by mouth daily.  . clindamycin (CLEOCIN) 300 MG capsule TK 2 CS PO 1 HOUR PRIOR TO DENTAL WORK  . docusate sodium (COLACE) 100 MG capsule Take 100 mg by mouth.  . fluticasone (FLONASE) 50 MCG/ACT nasal spray Place 2 sprays into both nostrils daily as needed for allergies.   Marland Kitchen HYDROcodone-acetaminophen (NORCO/VICODIN) 5-325 MG tablet Take 1 tablet by mouth daily as needed for moderate pain or severe pain.  Marland Kitchen HYDROcodone-acetaminophen (NORCO/VICODIN) 5-325 MG tablet Take 1 tablet by  mouth daily as needed for moderate pain or severe pain.  Marland Kitchen HYDROcodone-acetaminophen (NORCO/VICODIN) 5-325 MG tablet Take 1 tablet by mouth daily as needed for moderate pain or severe pain.  . Hypromellose (ARTIFICIAL TEARS OP) Apply 2 drops to eye daily as needed (for dry eyes). Dry eye  . lactulose (CHRONULAC) 10 GM/15ML solution Take 10 g by mouth 2 (two) times daily as needed for mild constipation.  . metFORMIN (GLUCOPHAGE) 1000 MG tablet Take 1,000 mg by mouth 2 (two) times daily with a meal.   . mirabegron ER (MYRBETRIQ) 50 MG TB24 tablet Take 50 mg by mouth daily.  Marland Kitchen omeprazole (PRILOSEC) 40 MG capsule Take 40 mg by mouth as needed.   . pravastatin (PRAVACHOL) 40 MG tablet Take 40 mg by mouth at bedtime.  . predniSONE (DELTASONE) 5 MG tablet Take 5 mg by mouth daily with breakfast.  . ranitidine (ZANTAC) 150 MG tablet Take 150 mg by mouth as needed for heartburn.   . SUMAtriptan (IMITREX) 100 MG tablet Take 1 tab at onset of headache, may take second dose after 1 hour. Do not take more than 2 a week  . topiramate (TOPAMAX) 100 MG tablet Take 200 mg by mouth 2 (two) times daily.  . traMADol (ULTRAM) 50 MG tablet Take 2 tablets (100 mg total) by mouth every 8 (eight) hours as needed for moderate pain or severe pain. (Patient taking differently: Take 50 mg by mouth at bedtime. )  . traZODone (DESYREL) 50 MG tablet Take 50 mg by mouth at bedtime.  Marland Kitchen VERAPAMIL HCL ER, CO, PO Take 120 mg by mouth at bedtime.   No current facility-administered medications on file prior to visit.   [place]  Medical:  Ms. Yusko  has a past medical history of Hypertension; IBS (irritable bowel syndrome); Postoperative nausea and vomiting; Diabetes mellitus ([place]); Left ankle instability; Headache(784.0); Left knee DJD; Depression; Frequency of urination; GERD (gastroesophageal reflux disease); High cholesterol; Neuromuscular disorder ([place]); Anxiety; Lumbar Degenerative Disc Disease of  (10/11/2014); Other  enthesopathy of ankle and tarsus (12/15/2009); Osteoarthritis of hip (Right) (05/05/2015); and Peripheral sensory neuropathy (Bilateral) (11/19/2014). Family: family history includes Cancer in her mother; Heart disease in her father; Hypertension in her father. Surgical:  has past surgical history that includes Ankle surgery; Abdominal hysterectomy; Knee arthroscopy (1997); Total knee arthroplasty (08/30/2011); Colonoscopy (2013); Appendectomy; left heart catheterization with coronary angiogram (N/A, 01/11/2013); and Esophagogastroduodenoscopy (egd) with propofol (N/A, 10/03/2014). Tobacco:  reports that she quit smoking about 20 years ago. Her smoking use included Cigarettes. She has a 30 pack-year smoking history. She has never used smokeless tobacco. Alcohol:  reports that she does not drink alcohol. Drug:  reports that she does not use illicit drugs.  Physical Exam  Vitals:  Today's Vitals   05/08/15 0914 05/08/15 0916  BP:  137/82  Pulse: 93  Temp: 97.9 F (36.6 C)   Resp: 16   Height: 5\' 5"  (1.651 m)   Weight: 185 lb (83.915 kg)   SpO2: 98%   PainSc: 5  5   PainLoc: Hip     Calculated BMI: Body mass index is 30.79 kg/(m^2). Obese (Class I) (30-34.9 kg/m2) - 68% higher incidence of chronic pain  General appearance: alert, appears older than stated age, no distress, moderately obese and upset  Gait: The patient has an antalgic gait and uses a cane to ambulate.  Assessment & Plan  Primary Diagnosis & Pertinent Problem List: The primary encounter diagnosis was Primary osteoarthritis of right hip. Diagnoses of Primary osteoarthritis of both hips and Chronic hip pain (Right) were also pertinent to this visit.  Visit Diagnosis: 1. Primary osteoarthritis of right hip   2. Primary osteoarthritis of both hips   3. Chronic hip pain (Right)     Problem-specific Plan(s): No problem-specific assessment & plan notes found for this encounter.   Plan of Care  Lab-work & Procedure  Ordered: No orders of the defined types were placed in this encounter.    Imaging Ordered: None  Interventional Therapies: Scheduled: Reschedule   Referral(s) or Consult(s): None at this time.  Medications administered during this visit: Ms. Clukey had no medications administered during this visit.  Future Appointments Date Time Provider [place]  05/13/2015 8:20 AM Milinda Pointer, MD [facility] None  05/19/2015 2:40 PM Milinda Pointer, MD [facility] None  05/29/2015 8:30 AM Amy Cletis [place], MD [facility]    Primary Care Physician: Kelton Pillar, Herschell Dimes, MD Location: [facility] Note by: Kathlen Brunswick Dossie Arbour, M.D, DABA, DABAPM, DABPM, DABIPP, FIPP  Pain Score Disclaimer: We use the NRS-11 scale. This is a self-reported, subjective measurement of pain severity with only modest accuracy. It is used primarily to identify changes within a particular patient. It must be understood that outpatient pain scales are significantly less accurate that those used for research, where they can be applied under ideal controlled circumstances with minimal exposure to variables. In reality, the score is likely to be a combination of pain intensity and pain affect, where pain affect describes the degree of emotional arousal or changes in action readiness caused by the sensory experience of pain. Factors such as social and work situation, setting, emotional state, anxiety levels, expectation, and prior pain experience may influence pain perception and show large inter-individual differences that may also be affected by time variables.

## 2015-05-08 NOTE — Progress Notes (Deleted)
Patient's Name: Heather Haley MRN: [id number] DOB: 1960-10-18 DOS: 05/08/2015  Primary Reason(s) for Visit: Interventional Pain Management Treatment. CC: Hip Pain   Pre-Procedure Assessment:  Heather Haley is a 55 y.o. year old, female patient, seen today for interventional treatment. She has ADVERSE DRUG REACTION; Hypertension; IBS (irritable bowel syndrome); Depression; Morbid obesity ([place]); Hypersomnia; Leg length inequality; Chest pressure; Other and unspecified angina pectoris; Encounter for therapeutic drug level monitoring; Encounter for long-term opiate analgesic use; Long-term current use of opiate analgesic; Uncomplicated opioid dependence ([place]); Opiate use (35 MME/Day); Chronic pain syndrome; Chronic constipation; Personal history of other diseases of the digestive system; Diabetic sensory peripheral neuropathy (Bilateral Lower Extremity); Generalized anxiety disorder; History of panic attacks; Insomnia; History of tobacco abuse; GERD (gastroesophageal reflux disease); Non-insulin dependent type 2 diabetes mellitus ([place]); Coccygeal pain; History of migraine; Chronic ankle pain (secondary to a work-related injury) (Date of injury 04/17/2006) (Left); Chronic low back pain (Location of Primary Pain) (Bilateral) (R>L); Lumbar spondylosis (L3-4 & L4-5); Lumbar facet syndrome (Location of Primary Source of Pain) (Bilateral) (R>L); Chronic pain; Headache, primary stabbing; Chronic hip pain (Right); Long term prescription opiate use; Chronic knee pain (S/P TKR:Total Knee Replacement) (Left); Osteoarthritis of hips (Location of Secondary source of pain) (Bilateral) (Right); History of total knee replacement (Left); Chronic groin pain (Location of Secondary source of pain) (Right); Chronic lower extremity pain (Location of Tertiary source of pain) (Bilateral) (R>L); and Osteoarthritis of hip (Right) on her problem list.. Her primarily concern today is the Hip Pain   Today's Initial Pain Score:  5/10 Reported level of pain is incompatible with clinical obrservations. This may be secondary to a possible lack of understanding on how the pain scale works. Pain Type: Chronic pain Pain Location: Hip Pain Orientation: Right Pain Descriptors / Indicators: Constant, Shooting, Throbbing Pain Frequency: Constant  Post-procedure Pain Score: 5   Date of Last Visit: 05/05/15 Service Provided on Last Visit: Med Refill  Verification of the correct person, correct site (including marking of site), and correct procedure were performed and confirmed by the patient.  Today's Vitals   05/08/15 0914 05/08/15 0916  BP:  137/82  Pulse: 93   Temp: 97.9 F (36.6 C)   Resp: 16   Height: '5\' 5"'  (1.651 m)   Weight: 185 lb (83.915 kg)   SpO2: 98%   PainSc: 5  5   PainLoc: Hip   Calculated BMI: Body mass index is 30.79 kg/(m^2). Allergies: She is allergic to buprenorphine hcl; codeine; cymbalta; duloxetine; gabapentin; lyrica; morphine and related; nortripytline hcl; nsaids; penicillin g; penicillins; and tolmetin.. Primary Diagnosis: Primary osteoarthritis of right hip [M16.11]  Procedure:  Type: Therapeutic Intra-Articular Hip Injection Region:  Posterolateral hip joint area. Level: Lower pelvic and hip joint level. Laterality: Right  Indications: 1. Primary osteoarthritis of right hip   2. Primary osteoarthritis of both hips   3. Chronic hip pain (Right)     In addition, Heather Haley has Chronic pain syndrome; Diabetic sensory peripheral neuropathy (Bilateral Lower Extremity); Coccygeal pain; Chronic ankle pain (secondary to a work-related injury) (Date of injury 04/17/2006) (Left); Chronic low back pain (Location of Primary Pain) (Bilateral) (R>L); Lumbar spondylosis (L3-4 & L4-5); Lumbar facet syndrome (Location of Primary Source of Pain) (Bilateral) (R>L); Chronic pain; Chronic hip pain (Right); Chronic knee pain (S/P TKR:Total Knee Replacement) (Left); Osteoarthritis of hips (Location  of Secondary source of pain) (Bilateral) (Right); History of total knee replacement (Left); Chronic groin pain (Location of Secondary source of pain) (Right); Chronic lower  extremity pain (Location of Tertiary source of pain) (Bilateral) (R>L); and Osteoarthritis of hip (Right) on her pertinent problem list.  Consent: Secured. Under the influence of no sedatives a written informed consent was obtained, after having provided information on the risks and possible complications. To fulfill our ethical and legal obligations, as recommended by the American Medical Association's Code of Ethics, we have provided information to the patient about our clinical impression; the nature and purpose of the treatment or procedure; the risks, benefits, and possible complications of the intervention; alternatives; the risk(s) and benefit(s) of the alternative treatment(s) or procedure(s); and the risk(s) and benefit(s) of doing nothing. The patient was provided information about the risks and possible complications associated with the procedure. These include, but are not limited to, failure to achieve desired goals, infection, bleeding, organ or nerve damage, allergic reactions, paralysis, and death. In the case of intra- or periarticular procedures these may include, but are not limited to, failure to achieve desired goals, infection, bleeding (hemarthrosis), organ or nerve damage, allergic reactions, and death. In addition, the patient was informed that Medicine is not an exact science; therefore, there is also the possibility of unforeseen risks and possible complications that may result in a catastrophic outcome. The patient indicated having understood very clearly. We have given the patient no guarantees and we have made no promises. Enough time was given to the patient to ask questions, all of which were answered to the patient's satisfaction.  Pre-Procedure Preparation: Safety Precautions: Allergies reviewed.  Appropriate site, procedure, and patient were confirmed by following the Joint Commission's Universal Protocol (UP.01.01.01), in the form of a "Time Out". The patient was asked to confirm marked site and procedure, before commencing. The patient was asked about blood thinners, or active infections, both of which were denied. Patient was assessed for positional comfort and all pressure points were checked before starting procedure. Monitoring:  As per clinic protocol. Infection Control Precautions: Sterile technique used. Standard Universal Precautions were taken as recommended by the Department of [facility] for Disease Control and Prevention (CDC). Standard pre-surgical skin prep was conducted. Respiratory hygiene and cough etiquette was practiced. Hand hygiene observed. Safe injection practices and needle disposal techniques followed. SDV (single dose vial) medications used. Medications properly checked for expiration dates and contaminants. Personal protective equipment (PPE) used: Radiation resistant gloves.  Anesthesia, Analgesia, Anxiolysis: Type: Local Anesthesia Local Anesthetic: Lidocaine 1% Route: Infiltration ([place]/IM) IV Access: Declined Sedation: Declined  Indication(s): Analgesia    Description of Procedure Process:  Time-out: "Time-out" completed before starting procedure, as per protocol. Position: Right Lateral Decubitus. Target Area: Superior aspect of the hip joint cavity, going thru the superior portion of the capsular ligament. Approach: Lateral approach. Area Prepped: Entire Posterolateral hip area. Prepping solution: ChloraPrep (2% chlorhexidine gluconate and 70% isopropyl alcohol) Safety Precautions: Aspiration looking for blood return was conducted prior to all injections. At no point did we inject any substances, as a needle was being advanced. No attempts were made at seeking any paresthesias. Safe injection practices and needle disposal techniques used.  Medications properly checked for expiration dates. SDV (single dose vial) medications used.   Description of the Procedure: Protocol guidelines were followed. The patient was placed in position over the fluoroscopy table. The target area was identified and the area prepped in the usual manner. Skin & deeper tissues infiltrated with local anesthetic. Appropriate amount of time allowed to pass for local anesthetics to take effect. The procedure needles were then advanced to the target  area. Proper needle placement secured. Negative aspiration confirmed. Solution injected in intermittent fashion, asking for systemic symptoms every 0.5cc of injectate. The needles were then removed and the area cleansed, making sure to leave some of the prepping solution back to take advantage of its long term bactericidal properties. EBL: Minimal Materials & Medications Used:  Needle(s) Used: 22g - 7" Spinal Needle(s) Solution Injected: 0.2% PF-Ropivacaine (28m) + SDV-DepoMedrol 80 mg/ml (116m Medications Administered today: Ms. WiBenbrookad no medications administered during this visit.Please see chart orders for dosing details.  Imaging Guidance:  Type of Imaging Technique: Fluoroscopy Guidance (Non-spinal) Indication(s): Assistance in needle guidance and placement for procedures requiring needle placement in or near specific anatomical locations not easily accessible without such assistance. Exposure Time: Please see nurses notes. Contrast: Not required. None used. Patient allergic to contrast dye. None used. Before injecting any contrast, we confirmed that the patient did not have an allergy to iodine, shellfish, or radiological contrast. Once satisfactory needle placement was completed at the desired level, radiological contrast was injected. Injection was conducted under continuous fluoroscopic guidance. Injection of contrast accomplished without complications. See chart for type and volume of contrast  used. Fluoroscopic Guidance: Not used. I was personally present in the fluoroscopy suite, where the patient was placed in position for the procedure, over the fluoroscopy-compatible table. Fluoroscopy was manipulated, using "Tunnel Vision Technique", to obtain the best possible view of the target area, on the affected side. Parallax error was corrected before commencing the procedure. A "direction-depth-direction" technique was used to introduce the needle under continuous pulsed fluoroscopic guidance. Once the target was reached, antero-posterior, oblique, and lateral fluoroscopic projection views were taken to confirm needle placement in all planes. Permanently recorded images stored by scanning into EMR. Ultrasound Guidance: Not used. Interpretation: Not applicable. No contrast injected. Intraoperative imaging interpretation by performing Physician. Adequate needle placement confirmed. Needle placement confirmed in AP, lateral, & Oblique Views. Appropriate spread of contrast to desired area. No evidence of afferent or efferent intravascular uptake. Permanent hardcopy images in multiple planes scanned into the patient's record.  Antibiotic Prophylaxis:  Indication(s): No indications identified. Type:  Antibiotics Given (last 72 hours)    None       Post-operative Assessment:  Complications: No immediate post-treatment complications were observed. Disposition: PRN return. The patient will call. The patient was transferred back to his room, once institutional criteria were met. The patient was discharged home, once institutional criteria were met. Return to clinic in 2 weeks for follow-up evaluation and interpretation of results. The patient tolerated the entire procedure well. A repeat set of vitals were taken after the procedure and the patient was kept under observation following institutional policy, for this type of procedure. Post-procedural neurological assessment was performed, showing return  to baseline, prior to discharge. The patient was provided with post-procedure discharge instructions, including a section on how to identify potential problems. Should any problems arise concerning this procedure, the patient was given instructions to immediately contact us[place]oreaat any time, without hesitation. In any case, we plan to contact the patient by telephone for a follow-up status report regarding this interventional procedure. Comments:  No additional relevant information.  Medications administered during this visit: Ms. WiNewboldad no medications administered during this visit.  Prescriptions ordered during this visit: New Prescriptions   No medications on file    Future Appointments Date Time Provider [place]/11/2015 2:40 PM FrMilinda PointerMD [facility] None  05/29/2015 8:30 AM Amy E Cletis [place] [facility]  Primary Care Physician: Shamleffer, Herschell Dimes, MD Location: [facility] Note by: Kathlen Brunswick. Dossie Arbour, M.D, DABA, DABAPM, DABPM, DABIPP, FIPP  Disclaimer:  Medicine is not an exact science. The only guarantee in medicine is that nothing is guaranteed. It is important to note that the decision to proceed with this intervention was based on the information collected from the patient. The Data and conclusions were drawn from the patient's questionnaire, the interview, and the physical examination. Because the information was provided in large part by the patient, it cannot be guaranteed that it has not been purposely or unconsciously manipulated. Every effort has been made to obtain as much relevant data as possible for this evaluation. It is important to note that the conclusions that lead to this procedure are derived in large part from the available data. Always take into account that the treatment will also be dependent on availability of resources and existing treatment guidelines, considered by other Pain Management  Practitioners as being common knowledge and practice, at the time of the intervention. For Medico-Legal purposes, it is also important to point out that variation in procedural techniques and pharmacological choices are the acceptable norm. The indications, contraindications, technique, and results of the above procedure should only be interpreted and judged by a Board-Certified Interventional Pain Specialist with extensive familiarity and expertise in the same exact procedure and technique. Attempts at providing opinions without similar or greater experience and expertise than that of the treating physician will be considered as inappropriate and unethical, and shall result in a formal complaint to the state medical board and applicable specialty societies.

## 2015-05-08 NOTE — Progress Notes (Deleted)
   Subjective:    Patient ID: Heather Haley, female    DOB: Oct 20, 1960, 55 y.o.   MRN: [id number]  HPI    Review of Systems     Objective:   Physical Exam        Assessment & Plan:

## 2015-05-08 NOTE — Progress Notes (Signed)
Entered room with Dr Dossie Arbour as he marked Heather Haley hip for her scheduled procedure. Dr Dossie Arbour then asked the patient if she had any questions or concerns. Heather Haley appeared to be upset and very quiet. She then spoke up and said that she was very upset how Dr Dossie Arbour approached her during the last visit. States "You came into the room and said that you will not be romantic, that you will be only be professional."   Dr Dossie Arbour responded back, "I never said that to you and have never used that word to any of my patients". Dr Dossie Arbour and patient went on to discuss her care . Heather Haley states that she wants to see a female spinal doctor to give her injections. At this time, much discussion was given to the patient regarding that Dr Consuela Mimes didn't want to prescribe pain medications for the her when another doctor was giving injections.This would help her with continuity of care, and MD being aware of her needs. Dr Dossie Arbour also informed the patient if she wanted to go to another doctor that would take care of precriptions and procedures that he would be OK with that, and that he would not do both.   Pt stated that she trusted Dr Consuela Mimes and wanted to continue care with him. Dr Dossie Arbour also informed patient that if there is anything she wanted to talk about he will take time to listen to her concerns and adjust his schedule accordingly to talk with her. He told her that he never wanted her to leave his office upset. After much discussion we both left the room.  I Heather Anderson RN) was asked to speak to the patient after her conversation with Dr. Dossie Arbour. I entered the room and sat down asking the patient if there was anything she needed to discuss. She was tearful and explained the above conversation to me stting that she had tried to shut the door for a private conversation on her last visit and DR. Dossie Arbour had not allowed her to close the door. I explained to ehr that it is not acceptable for the physician  to be in a closed room for the safety of both her and the physician. We both agreed that if she needed to speak with the door closed a nurse should be present. She informed me about a spine specialist she was planning to see. I informed her that Dr. Dossie Arbour would be happy for her to get another opinion and if another physician could offer her options there would be no problem. It is always better to have more than one person look at issues. She stated she did not want to stop being Dr. Cleda Daub patient. I informed her that if someone had something they could offer her to relieve her pain Dr. Dossie Arbour would want her to do that. At this point Dr. Dossie Arbour knocked on the door and asked could he come in. She stated yes. He began to explain her options and brought up the option of a second opinion and his encouragement for this to happen. He informed her that he does not have a preference and only wants to care for his patients. She asked about an MRI and he went to the computer and began discussing the MRI. She asked him if he had received a letter form Dr. Baltazar Najjar. He looked it up and she informed him it was a brain MRI and the results should have been sent to him. He went over the MRI and  informed  her that was not his expertise. She would need to discuss that with the neurologist. She stood up stating I'm leaving you now. I have taken up enough of your time and want ot cancel my procedure for today and reschedule for another day. Dr. Dossie Arbour agreed with this plan. She picked up her back pack and stated I'm leaving you now. I walked her to the front desk to make her next appointment.

## 2015-05-08 NOTE — Progress Notes (Signed)
Patient states she had a MRI of brain on the 19th of March.  Safety precautions to be maintained throughout the outpatient stay will include: orient to surroundings, keep bed in low position, maintain call bell within reach at all times, provide assistance with transfer out of bed and ambulation.

## 2015-05-13 ENCOUNTER — Ambulatory Visit: Payer: Medicare Other | Attending: Pain Medicine | Admitting: Pain Medicine

## 2015-05-13 ENCOUNTER — Telehealth: Payer: Self-pay | Admitting: Pain Medicine

## 2015-05-13 ENCOUNTER — Encounter: Payer: Self-pay | Admitting: Pain Medicine

## 2015-05-13 VITALS — BP 128/92 | HR 80 | Temp 98.0°F | Resp 14 | Ht 65.0 in | Wt 185.0 lb

## 2015-05-13 DIAGNOSIS — M533 Sacrococcygeal disorders, not elsewhere classified: Secondary | ICD-10-CM | POA: Diagnosis not present

## 2015-05-13 DIAGNOSIS — K5909 Other constipation: Secondary | ICD-10-CM | POA: Insufficient documentation

## 2015-05-13 DIAGNOSIS — Z79891 Long term (current) use of opiate analgesic: Secondary | ICD-10-CM | POA: Insufficient documentation

## 2015-05-13 DIAGNOSIS — F410 Panic disorder [episodic paroxysmal anxiety] without agoraphobia: Secondary | ICD-10-CM | POA: Diagnosis not present

## 2015-05-13 DIAGNOSIS — M1611 Unilateral primary osteoarthritis, right hip: Secondary | ICD-10-CM | POA: Diagnosis not present

## 2015-05-13 DIAGNOSIS — E1342 Other specified diabetes mellitus with diabetic polyneuropathy: Secondary | ICD-10-CM | POA: Diagnosis not present

## 2015-05-13 DIAGNOSIS — I10 Essential (primary) hypertension: Secondary | ICD-10-CM | POA: Insufficient documentation

## 2015-05-13 DIAGNOSIS — M549 Dorsalgia, unspecified: Secondary | ICD-10-CM | POA: Diagnosis present

## 2015-05-13 DIAGNOSIS — G4700 Insomnia, unspecified: Secondary | ICD-10-CM | POA: Insufficient documentation

## 2015-05-13 DIAGNOSIS — G4710 Hypersomnia, unspecified: Secondary | ICD-10-CM | POA: Diagnosis not present

## 2015-05-13 DIAGNOSIS — G43909 Migraine, unspecified, not intractable, without status migrainosus: Secondary | ICD-10-CM | POA: Diagnosis not present

## 2015-05-13 DIAGNOSIS — Z87891 Personal history of nicotine dependence: Secondary | ICD-10-CM | POA: Diagnosis not present

## 2015-05-13 DIAGNOSIS — R1030 Lower abdominal pain, unspecified: Secondary | ICD-10-CM | POA: Insufficient documentation

## 2015-05-13 DIAGNOSIS — M2170 Unequal limb length (acquired), unspecified site: Secondary | ICD-10-CM | POA: Insufficient documentation

## 2015-05-13 DIAGNOSIS — M160 Bilateral primary osteoarthritis of hip: Secondary | ICD-10-CM | POA: Diagnosis not present

## 2015-05-13 DIAGNOSIS — K589 Irritable bowel syndrome without diarrhea: Secondary | ICD-10-CM | POA: Diagnosis not present

## 2015-05-13 DIAGNOSIS — M25551 Pain in right hip: Secondary | ICD-10-CM | POA: Diagnosis not present

## 2015-05-13 DIAGNOSIS — G8929 Other chronic pain: Secondary | ICD-10-CM

## 2015-05-13 DIAGNOSIS — R51 Headache: Secondary | ICD-10-CM | POA: Insufficient documentation

## 2015-05-13 DIAGNOSIS — Z96652 Presence of left artificial knee joint: Secondary | ICD-10-CM | POA: Insufficient documentation

## 2015-05-13 DIAGNOSIS — F411 Generalized anxiety disorder: Secondary | ICD-10-CM | POA: Diagnosis not present

## 2015-05-13 DIAGNOSIS — K219 Gastro-esophageal reflux disease without esophagitis: Secondary | ICD-10-CM | POA: Diagnosis not present

## 2015-05-13 DIAGNOSIS — R079 Chest pain, unspecified: Secondary | ICD-10-CM | POA: Insufficient documentation

## 2015-05-13 DIAGNOSIS — F329 Major depressive disorder, single episode, unspecified: Secondary | ICD-10-CM | POA: Insufficient documentation

## 2015-05-13 DIAGNOSIS — M25572 Pain in left ankle and joints of left foot: Secondary | ICD-10-CM | POA: Insufficient documentation

## 2015-05-13 DIAGNOSIS — T50905A Adverse effect of unspecified drugs, medicaments and biological substances, initial encounter: Secondary | ICD-10-CM | POA: Diagnosis not present

## 2015-05-13 DIAGNOSIS — X58XXXA Exposure to other specified factors, initial encounter: Secondary | ICD-10-CM | POA: Insufficient documentation

## 2015-05-13 DIAGNOSIS — M25559 Pain in unspecified hip: Secondary | ICD-10-CM | POA: Diagnosis present

## 2015-05-13 MED ORDER — FENTANYL CITRATE (PF) 100 MCG/2ML IJ SOLN
INTRAMUSCULAR | Status: DC
Start: 2015-05-13 — End: 2015-05-13
  Filled 2015-05-13: qty 2

## 2015-05-13 MED ORDER — METHYLPREDNISOLONE ACETATE 80 MG/ML IJ SUSP
INTRAMUSCULAR | Status: AC
  Filled 2015-05-13: qty 1

## 2015-05-13 MED ORDER — ROPIVACAINE HCL 2 MG/ML IJ SOLN
INTRAMUSCULAR | Status: AC
  Filled 2015-05-13: qty 10

## 2015-05-13 MED ORDER — ROPIVACAINE HCL 2 MG/ML IJ SOLN
INTRAMUSCULAR | Status: AC
  Administered 2015-05-13: 9 mL
  Filled 2015-05-13: qty 10

## 2015-05-13 MED ORDER — MIDAZOLAM HCL 5 MG/5ML IJ SOLN
INTRAMUSCULAR | Status: AC
  Filled 2015-05-13: qty 5

## 2015-05-13 MED ORDER — LIDOCAINE HCL (PF) 1 % IJ SOLN
10.0000 mL | Freq: Once | INTRAMUSCULAR | Status: AC
  Administered 2015-05-13: 10 mL via SUBCUTANEOUS

## 2015-05-13 MED ORDER — TRIAMCINOLONE ACETONIDE 40 MG/ML IJ SUSP
INTRAMUSCULAR | Status: AC
  Filled 2015-05-13: qty 1

## 2015-05-13 MED ORDER — METHYLPREDNISOLONE ACETATE 80 MG/ML IJ SUSP
80.0000 mg | Freq: Once | INTRAMUSCULAR | Status: AC
  Administered 2015-05-13: 10:00:00 via INTRA_ARTICULAR

## 2015-05-13 MED ORDER — LIDOCAINE HCL (PF) 1 % IJ SOLN
INTRAMUSCULAR | Status: AC
  Administered 2015-05-13: 10 mL via SUBCUTANEOUS
  Filled 2015-05-13: qty 10

## 2015-05-13 MED ORDER — IOPAMIDOL (ISOVUE-M 200) INJECTION 41%
10.0000 mL | Freq: Once | INTRAMUSCULAR | Status: AC | PRN
  Administered 2015-05-13: 5 mL via INTRATHECAL

## 2015-05-13 MED ORDER — ROPIVACAINE HCL 2 MG/ML IJ SOLN
4.0000 mL | Freq: Once | INTRAMUSCULAR | Status: AC
  Administered 2015-05-13: 9 mL

## 2015-05-13 MED ORDER — IOPAMIDOL (ISOVUE-M 200) INJECTION 41%
INTRAMUSCULAR | Status: AC
  Administered 2015-05-13: 5 mL via INTRATHECAL
  Filled 2015-05-13: qty 10

## 2015-05-13 NOTE — Telephone Encounter (Signed)
Patient called and was worried about her next appointment.  Asked me to look and make sure her appointment is for a evaluation only.  I verified that the appointment on 5/2 was for an evaluation of hip injection done on 4/4 17

## 2015-05-13 NOTE — Progress Notes (Signed)
Patient's Name: Heather Haley MRN: [id number] DOB: [birth year] DOS: 05/13/2015  Primary Reason(s) for Visit: Interventional Pain Management Treatment. CC: Hip Pain and Back Pain   Procedure:  Anesthesia, Analgesia, Anxiolysis:  Type: Therapeutic Intra-Articular Hip Injection Region:  Posterolateral hip joint area. Level: Lower pelvic and hip joint level. Laterality: Right  Indications: 1. Primary osteoarthritis of right hip   2. Chronic hip pain (Right)     The patient has failed to respond to conservative therapies including over-the-counter medications, anti-inflammatories, muscle relaxants, membrane stabilizers, opioids, physical therapy, modalities such as heat and ice, as well as more invasive techniques such as nerve blocks. The patient did attained more than 50% relief of the pain from a series of diagnostic injections conducted in separate occasions.  Pre-procedure Pain Score: 5/10 Reported level of pain is compatible with clinical observations Post-procedure Pain Score: 0-No pain  Type: Local Anesthesia Local Anesthetic: Lidocaine 1% Route: Infiltration ([place]/IM) IV Access: Declined Sedation: Declined  Indication(s): Analgesia     Pre-Procedure Assessment  Heather Haley is a 55 y.o. year old, female patient, seen today for interventional treatment. She has ADVERSE DRUG REACTION; Hypertension; IBS (irritable bowel syndrome); Depression; Morbid obesity ([place]); Hypersomnia; Leg length inequality; Chest pressure; Other and unspecified angina pectoris; Encounter for therapeutic drug level monitoring; Encounter for long-term opiate analgesic use; Long-term current use of opiate analgesic; Uncomplicated opioid dependence ([place]); Opiate use (35 MME/Day); Chronic pain syndrome; Chronic constipation; Personal history of other diseases of the digestive system; Diabetic sensory peripheral neuropathy (Bilateral Lower Extremity); Generalized anxiety disorder; History of panic attacks;  Insomnia; History of tobacco abuse; GERD (gastroesophageal reflux disease); Non-insulin dependent type 2 diabetes mellitus ([place]); Coccygeal pain; History of migraine; Chronic ankle pain (secondary to a work-related injury) (Date of injury 04/17/2006) (Left); Chronic low back pain (Location of Primary Pain) (Bilateral) (R>L); Lumbar spondylosis (L3-4 & L4-5); Lumbar facet syndrome (Location of Primary Source of Pain) (Bilateral) (R>L); Chronic pain; Headache, primary stabbing; Chronic hip pain (Right); Long term prescription opiate use; Chronic knee pain (S/P TKR:Total Knee Replacement) (Left); Osteoarthritis of hips (Location of Secondary source of pain) (Bilateral) (Right); History of total knee replacement (Left); Chronic groin pain (Location of Secondary source of pain) (Right); Chronic lower extremity pain (Location of Tertiary source of pain) (Bilateral) (R>L); and Osteoarthritis of hip (Right) on her problem list.. Her primarily concern today is the Hip Pain and Back Pain   Pain Type: Chronic pain Pain Location: Hip Pain Orientation: Right Pain Descriptors / Indicators: Aching, Constant, Sharp Pain Frequency: Constant  Date of Last Visit: 05/08/15 Service Provided on Last Visit: Evaluation  Verification of the correct person, correct site (including marking of site), and correct procedure were performed and confirmed by the patient.  Today's Vitals   05/13/15 1002 05/13/15 1008 05/13/15 1012 05/13/15 1016  BP: 143/94 136/94 132/94 128/92  Pulse: 76 75 78 80  Temp:      TempSrc:      Resp: _0 Height:      Weight:      SpO2: 98% 97% 97% 97%  PainSc:    0-No pain  PainLoc:      Calculated BMI: Body mass index is 30.79 kg/(m^2). Allergies: She is allergic to buprenorphine hcl; codeine; cymbalta; duloxetine; gabapentin; lyrica; morphine and related; nortripytline hcl; nsaids; penicillin g; penicillins; and tolmetin.. Primary Diagnosis: Primary osteoarthritis of right hip  [M16.11]  Consent: Secured. Under the influence of no sedatives a written informed consent was obtained, after having provided  information on the risks and possible complications. To fulfill our ethical and legal obligations, as recommended by the American Medical Association's Code of Ethics, we have provided information to the patient about our clinical impression; the nature and purpose of the treatment or procedure; the risks, benefits, and possible complications of the intervention; alternatives; the risk(s) and benefit(s) of the alternative treatment(s) or procedure(s); and the risk(s) and benefit(s) of doing nothing. The patient was provided information about the risks and possible complications associated with the procedure. These include, but are not limited to, failure to achieve desired goals, infection, bleeding, organ or nerve damage, allergic reactions, paralysis, and death. In the case of intra- or periarticular procedures these may include, but are not limited to, failure to achieve desired goals, infection, bleeding (hemarthrosis), organ or nerve damage, allergic reactions, and death. In addition, the patient was informed that Medicine is not an exact science; therefore, there is also the possibility of unforeseen risks and possible complications that may result in a catastrophic outcome. The patient indicated having understood very clearly. We have given the patient no guarantees and we have made no promises. Enough time was given to the patient to ask questions, all of which were answered to the patient's satisfaction.  Pre-Procedure Preparation: Safety Precautions: Allergies reviewed. Appropriate site, procedure, and patient were confirmed by following the Joint Commission's Universal Protocol (UP.01.01.01), in the form of a "Time Out". The patient was asked to confirm marked site and procedure, before commencing. The patient was asked about blood thinners, or active infections, both of  which were denied. Patient was assessed for positional comfort and all pressure points were checked before starting procedure. Monitoring:  As per clinic protocol. Infection Control Precautions: Sterile technique used. Standard Universal Precautions were taken as recommended by the Department of [facility] for Disease Control and Prevention (CDC). Standard pre-surgical skin prep was conducted. Respiratory hygiene and cough etiquette was practiced. Hand hygiene observed. Safe injection practices and needle disposal techniques followed. SDV (single dose vial) medications used. Medications properly checked for expiration dates and contaminants. Personal protective equipment (PPE) used: Radiation resistant gloves.  Description of Procedure Process:   Time-out: "Time-out" completed before starting procedure, as per protocol. Position: Prone Target Area: Superior aspect of the hip joint cavity, going thru the superior portion of the capsular ligament. Approach: Posterior approach. Area Prepped: Entire Posterolateral hip area. Prepping solution: ChloraPrep (2% chlorhexidine gluconate and 70% isopropyl alcohol) Safety Precautions: Aspiration looking for blood return was conducted prior to all injections. At no point did we inject any substances, as a needle was being advanced. No attempts were made at seeking any paresthesias. Safe injection practices and needle disposal techniques used. Medications properly checked for expiration dates. SDV (single dose vial) medications used.   Description of the Procedure: Protocol guidelines were followed. The patient was placed in position over the fluoroscopy table. The target area was identified and the area prepped in the usual manner. Skin & deeper tissues infiltrated with local anesthetic. Appropriate amount of time allowed to pass for local anesthetics to take effect. The procedure needles were then advanced to the target area. Proper needle placement  secured. Negative aspiration confirmed. Solution injected in intermittent fashion, asking for systemic symptoms every 0.5cc of injectate. The needles were then removed and the area cleansed, making sure to leave some of the prepping solution back to take advantage of its long term bactericidal properties. EBL: Minimal Materials & Medications Used:  Needle(s) Used: 22g - 5" Spinal Needle(s)  Solution Injected: 0.2% PF-Ropivacaine (28m) + SDV-DepoMedrol 80 mg/ml (123m Medications Administered today: We administered lidocaine (PF), methylPREDNISolone acetate, iopamidol, lidocaine (PF), ropivacaine (PF) 2 mg/ml (0.2%), ropivacaine (PF) 2 mg/ml (0.2%), methylPREDNISolone acetate, and iopamidol.Please see chart orders for dosing details.  Imaging Guidance:   Type of Imaging Technique: Fluoroscopy Guidance (Non-spinal) Indication(s): Assistance in needle guidance and placement for procedures requiring needle placement in or near specific anatomical locations not easily accessible without such assistance. Exposure Time: Please see nurses notes. Contrast: Before injecting any contrast, we confirmed that the patient did not have an allergy to iodine, shellfish, or radiological contrast. Once satisfactory needle placement was completed at the desired level, radiological contrast was injected. Injection was conducted under continuous fluoroscopic guidance. Injection of contrast accomplished without complications. See chart for type and volume of contrast used. Fluoroscopic Guidance: I was personally present in the fluoroscopy suite, where the patient was placed in position for the procedure, over the fluoroscopy-compatible table. Fluoroscopy was manipulated, using "Tunnel Vision Technique", to obtain the best possible view of the target area, on the affected side. Parallax error was corrected before commencing the procedure. A "direction-depth-direction" technique was used to introduce the needle under continuous  pulsed fluoroscopic guidance. Once the target was reached, antero-posterior, oblique, and lateral fluoroscopic projection views were taken to confirm needle placement in all planes. Permanently recorded images stored by scanning into EMR. Interpretation: Intraoperative imaging interpretation by performing Physician. Adequate needle placement confirmed. Needle placement confirmed in AP, lateral, & Oblique Views. Appropriate spread of contrast to desired area. No evidence of afferent or efferent intravascular uptake. Permanent hardcopy images in multiple planes scanned into the patient's record.  Antibiotic Prophylaxis:  Indication(s): No indications identified. Type:  Antibiotics Given (last 72 hours)    None       Post-operative Assessment:   Complications: No immediate post-treatment complications were observed. Disposition: The patient was discharged home, once institutional criteria were met. Return to clinic in 2 weeks for follow-up evaluation and interpretation of results. The patient tolerated the entire procedure well. A repeat set of vitals were taken after the procedure and the patient was kept under observation following institutional policy, for this type of procedure. Post-procedural neurological assessment was performed, showing return to baseline, prior to discharge. The patient was provided with post-procedure discharge instructions, including a section on how to identify potential problems. Should any problems arise concerning this procedure, the patient was given instructions to immediately contact us[place]oreaat any time, without hesitation. In any case, we plan to contact the patient by telephone for a follow-up status report regarding this interventional procedure. Comments:  No additional relevant information.  Medications administered during this visit: We administered lidocaine (PF), methylPREDNISolone acetate, iopamidol, lidocaine (PF), ropivacaine (PF) 2 mg/ml (0.2%), ropivacaine  (PF) 2 mg/ml (0.2%), methylPREDNISolone acetate, and iopamidol.  Prescriptions ordered during this visit: New Prescriptions   No medications on file    Future Appointments Date Time Provider [place]4/11/2015 2:40 PM FrMilinda PointerMD [facility] None  05/29/2015 8:30 AM Amy E Cletis [place] [facility]  06/10/2015 1:20 PM [facility]    Primary Care Physician: ShKelton PillarIbHerschell DimesMD [facility] Note by: FrKathlen BrunswickNaDossie ArbourM.D, DABA, DABAPM, DABPM, DABIPP, FIPP  Disclaimer:  Medicine is not an exact science. The only guarantee in medicine is that nothing is guaranteed. It is important to note that the decision to proceed with this intervention was based on the information collected from  the patient. The Data and conclusions were drawn from the patient's questionnaire, the interview, and the physical examination. Because the information was provided in large part by the patient, it cannot be guaranteed that it has not been purposely or unconsciously manipulated. Every effort has been made to obtain as much relevant data as possible for this evaluation. It is important to note that the conclusions that lead to this procedure are derived in large part from the available data. Always take into account that the treatment will also be dependent on availability of resources and existing treatment guidelines, considered by other Pain Management Practitioners as being common knowledge and practice, at the time of the intervention. For Medico-Legal purposes, it is also important to point out that variation in procedural techniques and pharmacological choices are the acceptable norm. The indications, contraindications, technique, and results of the above procedure should only be interpreted and judged by a Board-Certified Interventional Pain Specialist with extensive familiarity and expertise in the same exact  procedure and technique. Attempts at providing opinions without similar or greater experience and expertise than that of the treating physician will be considered as inappropriate and unethical, and shall result in a formal complaint to the state medical board and applicable specialty societies.

## 2015-05-13 NOTE — Progress Notes (Signed)
Safety precautions to be maintained throughout the outpatient stay will include: orient to surroundings, keep bed in low position, maintain call bell within reach at all times, provide assistance with transfer out of bed and ambulation.  

## 2015-05-13 NOTE — Telephone Encounter (Signed)
Wants to speak with [facility], no reason given

## 2015-05-13 NOTE — Patient Instructions (Signed)

## 2015-05-14 ENCOUNTER — Telehealth: Payer: Self-pay | Admitting: *Deleted

## 2015-05-14 ENCOUNTER — Encounter: Payer: Self-pay | Admitting: Pain Medicine

## 2015-05-14 NOTE — Telephone Encounter (Signed)
No problems post procedurel

## 2015-05-14 NOTE — Telephone Encounter (Signed)
Found on AVS a previous PRN order for RF.  This is what patient is worried about.  Informed patient that the order was there for an as needed order and she was in no way obligated to have any procedures.  Informed her that she was scheduled for a follow up visit.

## 2015-05-14 NOTE — Progress Notes (Signed)
Quick Note:  The results of this diagnostic imaging were reviewed and found to be mildly abnormal. No surgically correctable pathology identified. Consider interventional pain management techniques. Based on these results the patient may be a candidate for intra-articular hip injections as well as possible lumbar facet blocks. ______

## 2015-05-16 ENCOUNTER — Other Ambulatory Visit: Payer: Self-pay

## 2015-05-16 ENCOUNTER — Observation Stay (HOSPITAL_COMMUNITY)
Admission: EM | Admit: 2015-05-16 | Discharge: 2015-05-17 | Discharge status: Against Medical Advice | Payer: Medicare Other | Attending: Internal Medicine | Admitting: Internal Medicine

## 2015-05-16 ENCOUNTER — Emergency Department (HOSPITAL_COMMUNITY): Payer: Medicare Other

## 2015-05-16 ENCOUNTER — Encounter (HOSPITAL_COMMUNITY): Payer: Self-pay

## 2015-05-16 DIAGNOSIS — E785 Hyperlipidemia, unspecified: Secondary | ICD-10-CM | POA: Insufficient documentation

## 2015-05-16 DIAGNOSIS — R109 Unspecified abdominal pain: Secondary | ICD-10-CM | POA: Diagnosis not present

## 2015-05-16 DIAGNOSIS — Z96652 Presence of left artificial knee joint: Secondary | ICD-10-CM | POA: Diagnosis not present

## 2015-05-16 DIAGNOSIS — Z7952 Long term (current) use of systemic steroids: Secondary | ICD-10-CM | POA: Diagnosis not present

## 2015-05-16 DIAGNOSIS — M479 Spondylosis, unspecified: Secondary | ICD-10-CM | POA: Insufficient documentation

## 2015-05-16 DIAGNOSIS — F411 Generalized anxiety disorder: Secondary | ICD-10-CM | POA: Diagnosis not present

## 2015-05-16 DIAGNOSIS — M545 Low back pain, unspecified: Secondary | ICD-10-CM | POA: Diagnosis present

## 2015-05-16 DIAGNOSIS — F329 Major depressive disorder, single episode, unspecified: Secondary | ICD-10-CM | POA: Insufficient documentation

## 2015-05-16 DIAGNOSIS — Z7984 Long term (current) use of oral hypoglycemic drugs: Secondary | ICD-10-CM | POA: Insufficient documentation

## 2015-05-16 DIAGNOSIS — M47816 Spondylosis without myelopathy or radiculopathy, lumbar region: Secondary | ICD-10-CM | POA: Diagnosis present

## 2015-05-16 DIAGNOSIS — Z955 Presence of coronary angioplasty implant and graft: Secondary | ICD-10-CM | POA: Diagnosis not present

## 2015-05-16 DIAGNOSIS — Z7951 Long term (current) use of inhaled steroids: Secondary | ICD-10-CM | POA: Diagnosis not present

## 2015-05-16 DIAGNOSIS — M25562 Pain in left knee: Secondary | ICD-10-CM | POA: Insufficient documentation

## 2015-05-16 DIAGNOSIS — G8929 Other chronic pain: Secondary | ICD-10-CM | POA: Diagnosis not present

## 2015-05-16 DIAGNOSIS — I10 Essential (primary) hypertension: Secondary | ICD-10-CM | POA: Diagnosis not present

## 2015-05-16 DIAGNOSIS — Z7982 Long term (current) use of aspirin: Secondary | ICD-10-CM | POA: Insufficient documentation

## 2015-05-16 DIAGNOSIS — Z79899 Other long term (current) drug therapy: Secondary | ICD-10-CM | POA: Insufficient documentation

## 2015-05-16 DIAGNOSIS — K219 Gastro-esophageal reflux disease without esophagitis: Secondary | ICD-10-CM | POA: Insufficient documentation

## 2015-05-16 DIAGNOSIS — M79662 Pain in left lower leg: Secondary | ICD-10-CM

## 2015-05-16 DIAGNOSIS — G43909 Migraine, unspecified, not intractable, without status migrainosus: Secondary | ICD-10-CM | POA: Insufficient documentation

## 2015-05-16 DIAGNOSIS — E7800 Pure hypercholesterolemia, unspecified: Secondary | ICD-10-CM | POA: Insufficient documentation

## 2015-05-16 DIAGNOSIS — I2510 Atherosclerotic heart disease of native coronary artery without angina pectoris: Secondary | ICD-10-CM | POA: Diagnosis not present

## 2015-05-16 DIAGNOSIS — R079 Chest pain, unspecified: Principal | ICD-10-CM

## 2015-05-16 DIAGNOSIS — Z79891 Long term (current) use of opiate analgesic: Secondary | ICD-10-CM

## 2015-05-16 DIAGNOSIS — Z8669 Personal history of other diseases of the nervous system and sense organs: Secondary | ICD-10-CM

## 2015-05-16 DIAGNOSIS — Z87891 Personal history of nicotine dependence: Secondary | ICD-10-CM | POA: Diagnosis not present

## 2015-05-16 DIAGNOSIS — K5900 Constipation, unspecified: Secondary | ICD-10-CM | POA: Insufficient documentation

## 2015-05-16 DIAGNOSIS — M1611 Unilateral primary osteoarthritis, right hip: Secondary | ICD-10-CM | POA: Insufficient documentation

## 2015-05-16 DIAGNOSIS — Z86718 Personal history of other venous thrombosis and embolism: Secondary | ICD-10-CM | POA: Diagnosis not present

## 2015-05-16 DIAGNOSIS — M79661 Pain in right lower leg: Secondary | ICD-10-CM

## 2015-05-16 DIAGNOSIS — E119 Type 2 diabetes mellitus without complications: Secondary | ICD-10-CM | POA: Insufficient documentation

## 2015-05-16 DIAGNOSIS — M5136 Other intervertebral disc degeneration, lumbar region: Secondary | ICD-10-CM | POA: Diagnosis not present

## 2015-05-16 DIAGNOSIS — F331 Major depressive disorder, recurrent, moderate: Secondary | ICD-10-CM | POA: Diagnosis present

## 2015-05-16 LAB — I-STAT TROPONIN, ED: Troponin i, poc: 0 ng/mL (ref 0.00–0.08)

## 2015-05-16 LAB — BASIC METABOLIC PANEL
ANION GAP: 9 (ref 5–15)
BUN: 18 mg/dL (ref 6–20)
CHLORIDE: 109 mmol/L (ref 101–111)
CO2: 24 mmol/L (ref 22–32)
CREATININE: 0.96 mg/dL (ref 0.44–1.00)
Calcium: 10.2 mg/dL (ref 8.9–10.3)
GFR calc non Af Amer: >60 mL/min (ref >60)
Glucose, Bld: 147 mg/dL — ABNORMAL HIGH (ref 65–99)
POTASSIUM: 4 mmol/L (ref 3.5–5.1)
Sodium: 142 mmol/L (ref 135–145)

## 2015-05-16 LAB — CBC
HEMATOCRIT: 41.5 % (ref 36.0–46.0)
HEMOGLOBIN: 13.5 g/dL (ref 12.0–15.0)
MCH: 28.6 pg (ref 26.0–34.0)
MCHC: 32.5 g/dL (ref 30.0–36.0)
MCV: 87.9 fL (ref 78.0–100.0)
Platelets: 245 10*3/uL (ref 150–400)
RBC: 4.72 MIL/uL (ref 3.87–5.11)
RDW: 12.9 % (ref 11.5–15.5)
WBC: 9.2 10*3/uL (ref 4.0–10.5)

## 2015-05-16 NOTE — ED Provider Notes (Signed)
CSN: [id number]     Arrival date & time 05/16/15  2105 History   First MD Initiated Contact with Patient 05/16/15 2209     Chief Complaint  Patient presents with  . Chest Pain  . Constipation   (Consider location/radiation/quality/duration/timing/severity/associated sxs/prior Treatment) HPI 55 y.o. female with a hx of HTN, DM, HLD, Chronic Pain, presents to the Emergency Department today complaining of constipation and chest pain.  1) Chest pain has been occuring intermittently for the past month. Pain is 3/10 with bouts lasting <10min of pulling sensation. Notes associated pain in left shoulder blade with burning sensation on exhalation and inhalation. No hx MI. Does have stent that was placed in 2014 due to nonobstructive CAD from [place]. No N/V/D. No SOB/ABD pain. No diaphoresis. No fevers. No other symptoms noted 2) Notes constipation x7 days with limited BMs. Has tried OTC Miralax QID as well as laxatives and other OTC remedies without success. Pt notes last passing of gas. Notes upper abdominal discomfort. Pt takes chronic pain medication and has had constipation in the past. No other symptoms noted.   Past Medical History  Diagnosis Date  . Hypertension   . IBS (irritable bowel syndrome)   . Postoperative nausea and vomiting   . Diabetes mellitus ([place])   . Left ankle instability   . Headache(784.0)   . Left knee DJD   . Depression   . Frequency of urination   . GERD (gastroesophageal reflux disease)   . High cholesterol   . Neuromuscular disorder ([place])   . Anxiety   . Lumbar Degenerative Disc Disease of  10/11/2014  . Other enthesopathy of ankle and tarsus 12/15/2009    Qualifier: Diagnosis of  By: Oneida Alar MD, KARL    . Osteoarthritis of hip (Right) 05/05/2015  . Peripheral sensory neuropathy (Bilateral) 11/19/2014   Past Surgical History  Procedure Laterality Date  . Ankle surgery    . Abdominal hysterectomy    . Knee arthroscopy  1997    left knee  . Total knee  arthroplasty  08/30/2011    Procedure: TOTAL KNEE ARTHROPLASTY;  Surgeon: Lorn Junes, MD;  Location: [facility];  Service: Orthopedics;  Laterality: Left;  . Colonoscopy  2013  . Appendectomy    . Left heart catheterization with coronary angiogram N/A 01/11/2013    Procedure: LEFT HEART CATHETERIZATION WITH CORONARY ANGIOGRAM;  Surgeon: Sinclair Grooms, MD;  Location: [facility] CATH LAB;  Service: Cardiovascular;  Laterality: N/A;  . Esophagogastroduodenoscopy (egd) with propofol N/A 10/03/2014    Procedure: ESOPHAGOGASTRODUODENOSCOPY (EGD) WITH PROPOFOL;  Surgeon: Josefine Class, MD;  Location: [facility] ENDOSCOPY;  Service: Endoscopy;  Laterality: N/A;   Family History  Problem Relation Age of Onset  . Cancer Mother   . Hypertension Father   . Heart disease Father    Social History  Substance Use Topics  . Smoking status: Former Smoker -- 2.00 packs/day for 15 years    Types: Cigarettes    Quit date: 02/09/1995  . Smokeless tobacco: Never Used  . Alcohol Use: No   OB History    No data available     Review of Systems ROS reviewed and all are negative for acute change except as noted in the HPI.  Allergies  Buprenorphine hcl; Codeine; Cymbalta; Duloxetine; Gabapentin; Lyrica; Morphine and related; Nortripytline hcl; Nsaids; Penicillin g; Penicillins; and Tolmetin  Home Medications   Prior to Admission medications   Medication Sig Start Date End Date Taking? Authorizing Provider  aspirin EC 81  MG tablet Take 81 mg by mouth daily. 01/08/13   Belva Crome, MD  bisacodyl (DULCOLAX) 5 MG EC tablet Take 5 mg by mouth daily as needed for moderate constipation.    Historical Provider, MD  buPROPion (WELLBUTRIN XL) 150 MG 24 hr tablet Take 300 mg by mouth daily.     Historical Provider, MD  busPIRone (BUSPAR) 5 MG tablet Take 5 mg by mouth at bedtime.     Historical Provider, MD  cetirizine (ZYRTEC) 10 MG tablet Take 1 tablet (10 mg total) by mouth at bedtime.    Susy Frizzle, MD   Cholecalciferol (VITAMIN D) 2000 UNITS CAPS Take 2,000 Units by mouth daily.    Historical Provider, MD  clindamycin (CLEOCIN) 300 MG capsule TK 2 CS PO 1 HOUR PRIOR TO DENTAL WORK 03/10/15   Historical Provider, MD  docusate sodium (COLACE) 100 MG capsule Take 100 mg by mouth.    Historical Provider, MD  fluticasone (FLONASE) 50 MCG/ACT nasal spray Place 2 sprays into both nostrils daily as needed for allergies.     Historical Provider, MD  HYDROcodone-acetaminophen (NORCO/VICODIN) 5-325 MG tablet Take 1 tablet by mouth daily as needed for moderate pain or severe pain. 02/19/15   Milinda Pointer, MD  HYDROcodone-acetaminophen (NORCO/VICODIN) 5-325 MG tablet Take 1 tablet by mouth daily as needed for moderate pain or severe pain. 02/19/15   Milinda Pointer, MD  HYDROcodone-acetaminophen (NORCO/VICODIN) 5-325 MG tablet Take 1 tablet by mouth daily as needed for moderate pain or severe pain. 02/19/15   Milinda Pointer, MD  Hypromellose (ARTIFICIAL TEARS OP) Apply 2 drops to eye daily as needed (for dry eyes). Dry eye    Historical Provider, MD  lactulose (CHRONULAC) 10 GM/15ML solution Take 10 g by mouth 2 (two) times daily as needed for mild constipation.    Historical Provider, MD  metFORMIN (GLUCOPHAGE) 1000 MG tablet Take 1,000 mg by mouth 2 (two) times daily with a meal.     Historical Provider, MD  omeprazole (PRILOSEC) 40 MG capsule Take 40 mg by mouth as needed.     Historical Provider, MD  pravastatin (PRAVACHOL) 40 MG tablet Take 40 mg by mouth at bedtime.    Historical Provider, MD  predniSONE (DELTASONE) 5 MG tablet Take 5 mg by mouth daily with breakfast.    Historical Provider, MD  ranitidine (ZANTAC) 150 MG tablet Take 150 mg by mouth as needed for heartburn.     Historical Provider, MD  SUMAtriptan (IMITREX) 100 MG tablet Take 1 tab at onset of headache, may take second dose after 1 hour. Do not take more than 2 a week 05/18/13   Cameron Sprang, MD  topiramate (TOPAMAX) 100 MG tablet  Take 200 mg by mouth 2 (two) times daily.    Historical Provider, MD  traMADol (ULTRAM) 50 MG tablet Take 2 tablets (100 mg total) by mouth every 8 (eight) hours as needed for moderate pain or severe pain. Patient taking differently: Take 50 mg by mouth at bedtime.  02/19/15   Milinda Pointer, MD  traZODone (DESYREL) 50 MG tablet Take 50 mg by mouth at bedtime.    Historical Provider, MD  VERAPAMIL HCL ER, CO, PO Take 120 mg by mouth at bedtime.    Historical Provider, MD   BP 135/96 mmHg  Pulse 81  Temp(Src) 98.2 F (36.8 C) (Oral)  Resp 16  SpO2 96%   Physical Exam  Constitutional: She is oriented to person, place, and time. She appears well-developed and  well-nourished.  HENT:  Head: Normocephalic and atraumatic.  Eyes: EOM are normal. Pupils are equal, round, and reactive to light.  Neck: Normal range of motion. Neck supple. No tracheal deviation present.  Cardiovascular: Normal rate, regular rhythm, normal heart sounds and intact distal pulses.   No murmur heard. Pulmonary/Chest: Effort normal and breath sounds normal. No respiratory distress. She has no wheezes. She has no rales. She exhibits no tenderness.  Abdominal: Soft. Normal appearance and bowel sounds are normal. She exhibits distension. There is tenderness in the right upper quadrant, left upper quadrant and left lower quadrant. There is no rigidity, no rebound, no guarding, no tenderness at McBurney's point and negative Murphy's sign.  Fecal mass palpable on abdominal exam  Musculoskeletal: Normal range of motion.  Neurological: She is alert and oriented to person, place, and time.  Skin: Skin is warm and dry.  Psychiatric: She has a normal mood and affect. Her behavior is normal. Thought content normal.  Nursing note and vitals reviewed.  ED Course  Procedures (including critical care time) Labs Review Labs Reviewed  BASIC METABOLIC PANEL - Abnormal; Notable for the following:    Glucose, Bld 147 (*)    All other  components within normal limits  CBC  I-STAT TROPOININ, ED   Imaging Review Dg Chest 2 View  05/16/2015  ADDENDUM REPORT: 05/16/2015 23:46 ADDENDUM: This addendum is created to include the abdominal radiograph that was performed concurrently with the two-view chest radiograph. CLINICAL DATA:  Constipation for 3 days. EXAM: Abdomen, one-view. COMPARISON:  CT 10/16/2014 FINDINGS: No dilated bowel loops. Moderate stool burden in the ascending and descending colon. No evidence of free air. No radiopaque calculi. No osseous abnormality. IMPRESSION: Moderate stool burden.  No bowel obstruction. Electronically Signed   By: Jeb Levering M.D.   On: 05/16/2015 23:46  05/16/2015  CLINICAL DATA:  Left-sided chest pain. EXAM: ABDOMEN - 1 VIEW; CHEST - 2 VIEW COMPARISON:  Radiographs and CT 10/09/2014 FINDINGS: The cardiomediastinal contours are normal. The lungs are clear. Pulmonary vasculature is normal. No consolidation, pleural effusion, or pneumothorax. No acute osseous abnormalities are seen. IMPRESSION: No acute pulmonary process. Electronically Signed: By: Jeb Levering M.D. On: 05/16/2015 23:40   Dg Abd 1 View  05/16/2015  ADDENDUM REPORT: 05/16/2015 23:46 ADDENDUM: This addendum is created to include the abdominal radiograph that was performed concurrently with the two-view chest radiograph. CLINICAL DATA:  Constipation for 3 days. EXAM: Abdomen, one-view. COMPARISON:  CT 10/16/2014 FINDINGS: No dilated bowel loops. Moderate stool burden in the ascending and descending colon. No evidence of free air. No radiopaque calculi. No osseous abnormality. IMPRESSION: Moderate stool burden.  No bowel obstruction. Electronically Signed   By: Jeb Levering M.D.   On: 05/16/2015 23:46  05/16/2015  CLINICAL DATA:  Left-sided chest pain. EXAM: ABDOMEN - 1 VIEW; CHEST - 2 VIEW COMPARISON:  Radiographs and CT 10/09/2014 FINDINGS: The cardiomediastinal contours are normal. The lungs are clear. Pulmonary vasculature is  normal. No consolidation, pleural effusion, or pneumothorax. No acute osseous abnormalities are seen. IMPRESSION: No acute pulmonary process. Electronically Signed: By: Jeb Levering M.D. On: 05/16/2015 23:40   I have personally reviewed and evaluated these images and lab results as part of my medical decision-making.   EKG Interpretation   Date/Time:  Friday May 16 2015 21:14:18 EDT Ventricular Rate:  80 PR Interval:  162 QRS Duration: 93 QT Interval:  354 QTC Calculation: 408 R Axis:   -13 Text Interpretation:  Sinus rhythm Low voltage,  precordial leads RSR' in  V1 or V2, right VCD or RVH Borderline abnrm T, anterolateral leads  Baseline wander in lead(s) I III aVL Confirmed by DELO  MD, DOUGLAS  [phone]) on 05/16/2015 10:17:06 PM      MDM  I have reviewed and evaluated the relevant laboratory values. I have reviewed and evaluated the relevant imaging studies. I personally evaluated and interpreted the relevant EKG. I have reviewed the relevant previous healthcare records. I obtained HPI from historian. Patient discussed with supervising physician  ED Course:  Assessment: Pt is a 55yF presents with CP x 1 month. Intermittent. Risk Factors HTN, DM, HLD, CAD 2014. Concern for cardiac etiology of Chest Pain. Trop negative. EKG showed nonspecific T wave abnormalities. CXR unremarkable. KUB shows mod stool impaction. No SBO. Pt does not have chest pain currently. Heart Score 4. Discussed with supervising physician and agree with chest pain rule out admission. Medicine has been consulted and will see patient in the ED for likely admit for CP rule out. Pt does not meet criteria for CP protocol and a further evaluation is recommended. Pt has been re-evaluated prior to consult and VSS, NAD, heart RRR, pain 0/10, lungs CTAB. Pt could benefit with stress test and further evaluation.   Disposition/Plan:  Admit to obs Pt acknowledges and agrees with plan  Supervising Physician Lacretia Leigh,  MD   Final diagnoses:  Chest pain, unspecified chest pain type      Shary Decamp, PA-C 05/17/15 0022  Veatrice Kells, MD 05/17/15 0144

## 2015-05-16 NOTE — ED Notes (Addendum)
Patient c/o stabbing pain that starts under the left shoulder blade that burns and when inhales and exhales pain becomes worse.  Patient states that feels like pressure in her chest like "something is pulling on her". Denies N/V, Denies diaphoresis, denies fevers, Denies SOB.  Patient also c/o headache on the back of her head.  Patient states has a Hx of migraine.  Patient states that pain has been going on for about 30 days but states that pain is worse today.  Pain scale 3/10.  Patient NAD at this time.

## 2015-05-17 ENCOUNTER — Observation Stay (HOSPITAL_COMMUNITY): Payer: Medicare Other

## 2015-05-17 ENCOUNTER — Ambulatory Visit (HOSPITAL_COMMUNITY): Payer: Self-pay

## 2015-05-17 ENCOUNTER — Encounter (HOSPITAL_COMMUNITY): Payer: Self-pay | Admitting: Internal Medicine

## 2015-05-17 DIAGNOSIS — R079 Chest pain, unspecified: Secondary | ICD-10-CM | POA: Diagnosis not present

## 2015-05-17 DIAGNOSIS — F411 Generalized anxiety disorder: Secondary | ICD-10-CM

## 2015-05-17 DIAGNOSIS — R109 Unspecified abdominal pain: Secondary | ICD-10-CM | POA: Diagnosis present

## 2015-05-17 DIAGNOSIS — Z8669 Personal history of other diseases of the nervous system and sense organs: Secondary | ICD-10-CM

## 2015-05-17 DIAGNOSIS — R1084 Generalized abdominal pain: Secondary | ICD-10-CM | POA: Diagnosis not present

## 2015-05-17 DIAGNOSIS — M79661 Pain in right lower leg: Secondary | ICD-10-CM | POA: Insufficient documentation

## 2015-05-17 DIAGNOSIS — M545 Low back pain: Secondary | ICD-10-CM | POA: Diagnosis not present

## 2015-05-17 DIAGNOSIS — I10 Essential (primary) hypertension: Secondary | ICD-10-CM

## 2015-05-17 DIAGNOSIS — G8929 Other chronic pain: Secondary | ICD-10-CM

## 2015-05-17 DIAGNOSIS — M79662 Pain in left lower leg: Secondary | ICD-10-CM

## 2015-05-17 DIAGNOSIS — E119 Type 2 diabetes mellitus without complications: Secondary | ICD-10-CM

## 2015-05-17 LAB — TROPONIN I
Troponin I: <0.03 ng/mL (ref <0.031)
Troponin I: <0.03 ng/mL (ref <0.031)

## 2015-05-17 LAB — LIPID PANEL
CHOLESTEROL: 190 mg/dL (ref 0–200)
HDL: 81 mg/dL (ref >40)
LDL CALC: 93 mg/dL (ref 0–99)
TRIGLYCERIDES: 81 mg/dL (ref <150)
Total CHOL/HDL Ratio: 2.3 RATIO
VLDL: 16 mg/dL (ref 0–40)

## 2015-05-17 LAB — GLUCOSE, CAPILLARY
Glucose-Capillary: 107 mg/dL — ABNORMAL HIGH (ref 65–99)
Glucose-Capillary: 117 mg/dL — ABNORMAL HIGH (ref 65–99)

## 2015-05-17 LAB — RAPID URINE DRUG SCREEN, HOSP PERFORMED
Amphetamines: NOT DETECTED
BARBITURATES: NOT DETECTED
Benzodiazepines: NOT DETECTED
Cocaine: NOT DETECTED
OPIATES: POSITIVE — AB
Tetrahydrocannabinol: NOT DETECTED

## 2015-05-17 LAB — PROTIME-INR
INR: 1.06 (ref 0.00–1.49)
Prothrombin Time: 13.6 seconds (ref 11.6–15.2)

## 2015-05-17 LAB — APTT: APTT: 25 s (ref 24–37)

## 2015-05-17 LAB — LIPASE, BLOOD: Lipase: 30 U/L (ref 11–51)

## 2015-05-17 MED ORDER — HEPARIN SODIUM (PORCINE) 5000 UNIT/ML IJ SOLN
5000.0000 [IU] | Freq: Three times a day (TID) | INTRAMUSCULAR | Status: DC
  Administered 2015-05-17: 5000 [IU] via SUBCUTANEOUS
  Filled 2015-05-17: qty 1

## 2015-05-17 MED ORDER — SODIUM CHLORIDE 0.9 % IV SOLN
INTRAVENOUS | Status: DC
  Administered 2015-05-17: 02:00:00 via INTRAVENOUS

## 2015-05-17 MED ORDER — VERAPAMIL HCL ER 120 MG PO TBCR
120.0000 mg | EXTENDED_RELEASE_TABLET | Freq: Every day | ORAL | Status: DC
  Administered 2015-05-17: 120 mg via ORAL
  Filled 2015-05-17: qty 1

## 2015-05-17 MED ORDER — PANTOPRAZOLE SODIUM 40 MG PO TBEC
40.0000 mg | DELAYED_RELEASE_TABLET | Freq: Every day | ORAL | Status: DC
  Administered 2015-05-17: 40 mg via ORAL
  Filled 2015-05-17: qty 1

## 2015-05-17 MED ORDER — POLYETHYLENE GLYCOL 3350 17 G PO PACK
17.0000 g | PACK | Freq: Two times a day (BID) | ORAL | Status: DC
  Administered 2015-05-17: 17 g via ORAL
  Filled 2015-05-17: qty 1

## 2015-05-17 MED ORDER — BUPROPION HCL ER (XL) 300 MG PO TB24
300.0000 mg | ORAL_TABLET | Freq: Every day | ORAL | Status: DC
  Administered 2015-05-17: 300 mg via ORAL
  Filled 2015-05-17: qty 1

## 2015-05-17 MED ORDER — NITROGLYCERIN 0.4 MG SL SUBL: 0.4000 mg | SUBLINGUAL_TABLET | SUBLINGUAL | Status: DC | PRN

## 2015-05-17 MED ORDER — ONDANSETRON HCL 4 MG/2ML IJ SOLN: 4.0000 mg | Freq: Three times a day (TID) | INTRAMUSCULAR | Status: DC | PRN

## 2015-05-17 MED ORDER — VITAMIN D 1000 UNITS PO TABS
2000.0000 [IU] | ORAL_TABLET | Freq: Every day | ORAL | Status: DC
Start: 2015-05-17 — End: 2015-05-17
  Administered 2015-05-17: 2000 [IU] via ORAL
  Filled 2015-05-17: qty 2

## 2015-05-17 MED ORDER — ACETAMINOPHEN 325 MG PO TABS: 650.0000 mg | ORAL_TABLET | Freq: Four times a day (QID) | ORAL | Status: DC | PRN

## 2015-05-17 MED ORDER — ACETAMINOPHEN 650 MG RE SUPP: 650.0000 mg | Freq: Four times a day (QID) | RECTAL | Status: DC | PRN

## 2015-05-17 MED ORDER — HYDROCODONE-ACETAMINOPHEN 5-325 MG PO TABS
1.0000 | ORAL_TABLET | ORAL | Status: DC | PRN
  Administered 2015-05-17: 1 via ORAL
  Filled 2015-05-17: qty 1

## 2015-05-17 MED ORDER — HYDROCODONE-ACETAMINOPHEN 5-325 MG PO TABS: 0.5000 | ORAL_TABLET | Freq: Two times a day (BID) | ORAL | Status: DC

## 2015-05-17 MED ORDER — ASPIRIN EC 325 MG PO TBEC
325.0000 mg | DELAYED_RELEASE_TABLET | Freq: Every day | ORAL | Status: DC
  Administered 2015-05-17: 325 mg via ORAL
  Filled 2015-05-17 (×2): qty 1

## 2015-05-17 MED ORDER — INSULIN ASPART 100 UNIT/ML ~~LOC~~ SOLN: 0.0000 [IU] | Freq: Every day | SUBCUTANEOUS | Status: DC

## 2015-05-17 MED ORDER — TOPIRAMATE 100 MG PO TABS
200.0000 mg | ORAL_TABLET | Freq: Two times a day (BID) | ORAL | Status: DC
  Administered 2015-05-17 (×2): 200 mg via ORAL
  Filled 2015-05-17 (×3): qty 2

## 2015-05-17 MED ORDER — SODIUM CHLORIDE 0.9% FLUSH
3.0000 mL | Freq: Two times a day (BID) | INTRAVENOUS | Status: DC
  Administered 2015-05-17: 3 mL via INTRAVENOUS

## 2015-05-17 MED ORDER — TRAZODONE HCL 50 MG PO TABS
50.0000 mg | ORAL_TABLET | Freq: Every day | ORAL | Status: DC
  Administered 2015-05-17: 50 mg via ORAL
  Filled 2015-05-17: qty 1

## 2015-05-17 MED ORDER — FAMOTIDINE 20 MG PO TABS
20.0000 mg | ORAL_TABLET | Freq: Every day | ORAL | Status: DC
  Administered 2015-05-17: 20 mg via ORAL
  Filled 2015-05-17: qty 1

## 2015-05-17 MED ORDER — ARTIFICIAL TEARS OP OINT
TOPICAL_OINTMENT | Freq: Every day | OPHTHALMIC | Status: DC | PRN
  Filled 2015-05-17: qty 3.5

## 2015-05-17 MED ORDER — FLUTICASONE PROPIONATE 50 MCG/ACT NA SUSP
2.0000 | Freq: Every day | NASAL | Status: DC | PRN
  Filled 2015-05-17: qty 16

## 2015-05-17 MED ORDER — TRAMADOL HCL 50 MG PO TABS: 100.0000 mg | ORAL_TABLET | Freq: Three times a day (TID) | ORAL | Status: DC

## 2015-05-17 MED ORDER — IOHEXOL 350 MG/ML SOLN
100.0000 mL | Freq: Once | INTRAVENOUS | Status: AC | PRN
  Administered 2015-05-17: 100 mL via INTRAVENOUS

## 2015-05-17 MED ORDER — BISACODYL 5 MG PO TBEC: 5.0000 mg | DELAYED_RELEASE_TABLET | Freq: Every day | ORAL | Status: DC | PRN

## 2015-05-17 MED ORDER — LACTULOSE 10 GM/15ML PO SOLN: 10.0000 g | Freq: Two times a day (BID) | ORAL | Status: DC | PRN

## 2015-05-17 MED ORDER — INSULIN ASPART 100 UNIT/ML ~~LOC~~ SOLN: 0.0000 [IU] | Freq: Three times a day (TID) | SUBCUTANEOUS | Status: DC

## 2015-05-17 MED ORDER — SUMATRIPTAN SUCCINATE 100 MG PO TABS
100.0000 mg | ORAL_TABLET | ORAL | Status: DC | PRN
  Filled 2015-05-17: qty 1

## 2015-05-17 MED ORDER — PREDNISONE 5 MG PO TABS
5.0000 mg | ORAL_TABLET | Freq: Every day | ORAL | Status: DC
  Administered 2015-05-17: 5 mg via ORAL
  Filled 2015-05-17: qty 1

## 2015-05-17 MED ORDER — PRAVASTATIN SODIUM 40 MG PO TABS
40.0000 mg | ORAL_TABLET | Freq: Every day | ORAL | Status: DC
  Administered 2015-05-17: 40 mg via ORAL
  Filled 2015-05-17: qty 1

## 2015-05-17 MED ORDER — LORATADINE 10 MG PO TABS
10.0000 mg | ORAL_TABLET | Freq: Every day | ORAL | Status: DC
  Administered 2015-05-17: 10 mg via ORAL
  Filled 2015-05-17: qty 1

## 2015-05-17 NOTE — Progress Notes (Signed)
Pt. c/o  That she had not had her tramadol and the Norco made he burn and flush ,also that part of her records were missing. MD had talked with her about 1 hour explaining that  Epic went only back to when we started with epic. He stated he would order her tramadol. States she wants something for her stomach. Pt became anxious after MD left an d demanding her meds now - no orders had yet been put in.  Stated she wanted to leave, informed she would have to sign out AMA. MD called and states hje is putting her orders in now. Pt refused to stay. Had pulled IV out and Tele off and dressed and left.

## 2015-05-17 NOTE — H&P (Signed)
[facility] History and Physical  Heather Haley [id number] DOB: 1961-02-01 DOA: 05/16/2015  Referring physician: ED physician PCP: Lottie Dawson, MD  Specialists:   Chief Complaint: chest pain and abdominal pain.  HPI: Heather Haley is a 55 y.o. female with PMH of hypertension, hyperlipidemia, diabetes mellitus, GERD, gout, depression, anxiety, migraine headaches, IBS, back pain, bilateral peripheral neuropathy, opiate dependency, right leg DVT 2013, who presents with chest pain and abdominal pain.  Patient reports that she has been having intermittent moderate pain over left chest, left shoulder blade area for about 30 days, which has worsened today. The chest pain is pleuritic, is aggravated by deep breath. Patient does not have shortness of breath. She also has  bilateral calf pain in the past 2 days. Patient does not have fever, chills, cough. Patient reports that she has been constipated for about 1 week. She has abdominal distention and whole abdominal pain. It is constant, 5 out of 10 in severity, associated with nausea, no vomiting. She does not have symptoms of UTI or unilateral weakness.  In ED, patient was found to have WBC 9.2, negative troponin, temperature normal, no tachycardia, electrolytes and renal function okay, negative chest x-ray. Abdominal x-ray showed stool burden. CT angiogram of chest is negative for PE. CT abdomen is negative for acute intracranial abnormalities, but stool burden. Patient's admitted to inpatient for further evaluation and treatment and observation.  EKG: Independently reviewed. QTC 408, low voltage.  Where does patient live?   At home  Can patient participate in ADLs?  Yes  Review of Systems:   General: no fevers, chills, no changes in body weight, has poor appetite, has fatigue HEENT: no blurry vision, hearing changes or sore throat Pulm: no dyspnea, coughing, wheezing CV: has chest pain, no palpitations Abd: has  nausea, abdominal pain, constipation. No diarrhea, no vomiting, GU: no dysuria, burning on urination, increased urinary frequency, hematuria  Ext: no leg edema. Has tenderness over bilateral calf areas.  Neuro: no unilateral weakness, numbness, or tingling, no vision change or hearing loss Skin: no rash MSK: No muscle spasm, no deformity, no limitation of range of movement in spin Heme: No easy bruising.  Travel history: No recent long distant travel.  Allergy:  Allergies  Allergen Reactions  . Buprenorphine Hcl Other (See Comments)    Unable to void  . Codeine Hives    REACTION: hives  . Cymbalta [Duloxetine Hcl] Other (See Comments)    Altered mental status  . Duloxetine Other (See Comments)    Altered mental status Alopecia, visual hallucinations, nightmares  . Gabapentin Swelling  . Lyrica [Pregabalin] Other (See Comments)    Alopecia, visual hallucinations, nightmares Altered mental status Alopecia, visual hallucinations, nightmares Altered mental status  . Morphine And Related Other (See Comments)    Unable to void  . Nortripytline Hcl [Nortriptyline] Other (See Comments)    Hair loss and night mares Hair loss and night mares  . Nsaids     REACTION: palpitations, diaphoresis  . Penicillin G Nausea And Vomiting  . Penicillins     REACTION: upset stomach  . Tolmetin Other (See Comments)    REACTION: palpitations, diaphoresis    Past Medical History  Diagnosis Date  . Hypertension   . IBS (irritable bowel syndrome)   . Postoperative nausea and vomiting   . Diabetes mellitus ([place])   . Left ankle instability   . Headache(784.0)   . Left knee DJD   . Depression   . Frequency of  urination   . GERD (gastroesophageal reflux disease)   . High cholesterol   . Neuromuscular disorder ([place])   . Anxiety   . Lumbar Degenerative Disc Disease of  10/11/2014  . Other enthesopathy of ankle and tarsus 12/15/2009    Qualifier: Diagnosis of  By: Oneida Alar MD, KARL    .  Osteoarthritis of hip (Right) 05/05/2015  . Peripheral sensory neuropathy (Bilateral) 11/19/2014    Past Surgical History  Procedure Laterality Date  . Ankle surgery    . Abdominal hysterectomy    . Knee arthroscopy  1997    left knee  . Total knee arthroplasty  08/30/2011    Procedure: TOTAL KNEE ARTHROPLASTY;  Surgeon: Lorn Junes, MD;  Location: [place];  Service: Orthopedics;  Laterality: Left;  . Colonoscopy  2013  . Appendectomy    . Left heart catheterization with coronary angiogram N/A 01/11/2013    Procedure: LEFT HEART CATHETERIZATION WITH CORONARY ANGIOGRAM;  Surgeon: Sinclair Grooms, MD;  Location: [facility] CATH LAB;  Service: Cardiovascular;  Laterality: N/A;  . Esophagogastroduodenoscopy (egd) with propofol N/A 10/03/2014    Procedure: ESOPHAGOGASTRODUODENOSCOPY (EGD) WITH PROPOFOL;  Surgeon: Josefine Class, MD;  Location: [facility] ENDOSCOPY;  Service: Endoscopy;  Laterality: N/A;    Social History:  reports that she quit smoking about 20 years ago. Her smoking use included Cigarettes. She has a 30 pack-year smoking history. She has never used smokeless tobacco. She reports that she does not drink alcohol or use illicit drugs.  Family History:  Family History  Problem Relation Age of Onset  . Cancer Mother   . Hypertension Father   . Heart disease Father      Prior to Admission medications   Medication Sig Start Date End Date Taking? Authorizing Provider  aspirin EC 81 MG tablet Take 81 mg by mouth daily. 01/08/13  Yes Belva Crome, MD  bisacodyl (DULCOLAX) 5 MG EC tablet Take 5 mg by mouth daily as needed for moderate constipation.   Yes Historical Provider, MD  buPROPion (WELLBUTRIN XL) 150 MG 24 hr tablet Take 300 mg by mouth daily.    Yes Historical Provider, MD  cetirizine (ZYRTEC) 10 MG tablet Take 1 tablet (10 mg total) by mouth at bedtime. Patient taking differently: Take 10 mg by mouth daily as needed for allergies.    Yes Susy Frizzle, MD  Cholecalciferol  (VITAMIN D) 2000 UNITS CAPS Take 2,000 Units by mouth daily.   Yes Historical Provider, MD  clindamycin (CLEOCIN) 300 MG capsule TK 2 CS PO 1 HOUR PRIOR TO DENTAL WORK 03/10/15  Yes Historical Provider, MD  fluticasone (FLONASE) 50 MCG/ACT nasal spray Place 2 sprays into both nostrils daily as needed for allergies.    Yes Historical Provider, MD  HYDROcodone-acetaminophen (NORCO/VICODIN) 5-325 MG tablet Take 1 tablet by mouth daily as needed for moderate pain or severe pain. 02/19/15  Yes Milinda Pointer, MD  Hypromellose (ARTIFICIAL TEARS OP) Apply 2 drops to eye daily as needed (for dry eyes). Dry eye   Yes Historical Provider, MD  lactulose (CHRONULAC) 10 GM/15ML solution Take 10 g by mouth 2 (two) times daily as needed for mild constipation.   Yes Historical Provider, MD  metFORMIN (GLUCOPHAGE) 1000 MG tablet Take 1,000 mg by mouth 2 (two) times daily with a meal.    Yes Historical Provider, MD  omeprazole (PRILOSEC) 40 MG capsule Take 40 mg by mouth daily as needed (indigestion).    Yes Historical Provider, MD  pravastatin (PRAVACHOL)  40 MG tablet Take 40 mg by mouth at bedtime.   Yes Historical Provider, MD  predniSONE (DELTASONE) 5 MG tablet Take 5 mg by mouth at bedtime.    Yes Historical Provider, MD  ranitidine (ZANTAC) 150 MG tablet Take 150 mg by mouth as needed for heartburn.    Yes Historical Provider, MD  SUMAtriptan (IMITREX) 100 MG tablet Take 1 tab at onset of headache, may take second dose after 1 hour. Do not take more than 2 a week 05/18/13  Yes Cameron Sprang, MD  topiramate (TOPAMAX) 100 MG tablet Take 200 mg by mouth 2 (two) times daily.   Yes Historical Provider, MD  traMADol (ULTRAM) 50 MG tablet Take 2 tablets (100 mg total) by mouth every 8 (eight) hours as needed for moderate pain or severe pain. Patient taking differently: Take 50 mg by mouth at bedtime.  02/19/15  Yes Milinda Pointer, MD  traZODone (DESYREL) 50 MG tablet Take 50 mg by mouth at bedtime.   Yes Historical  Provider, MD  VERAPAMIL HCL ER, CO, PO Take 120 mg by mouth at bedtime.   Yes Historical Provider, MD  HYDROcodone-acetaminophen (NORCO/VICODIN) 5-325 MG tablet Take 1 tablet by mouth daily as needed for moderate pain or severe pain. Patient not taking: Reported on 05/16/2015 02/19/15   Milinda Pointer, MD  HYDROcodone-acetaminophen (NORCO/VICODIN) 5-325 MG tablet Take 1 tablet by mouth daily as needed for moderate pain or severe pain. Patient not taking: Reported on 05/16/2015 02/19/15   Milinda Pointer, MD    Physical Exam: Filed Vitals:   05/16/15 2124 05/16/15 2324 05/17/15 0100 05/17/15 0128  BP: 135/96 121/82 124/76 132/80  Pulse: 81 69 61 59  Temp: 98.2 F (36.8 C) 97.7 F (36.5 C)  97.7 F (36.5 C)  TempSrc: Oral Oral  Oral  Resp: 16 17 16 16   Height:    5\' 5"  (1.651 m)  Weight:    85.73 kg (189 lb)  SpO2: 96% 97% 97% 98%   General: Not in acute distress HEENT:       Eyes: PERRL, EOMI, no scleral icterus.       ENT: No discharge from the ears and nose, no pharynx injection, no tonsillar enlargement.        Neck: No JVD, no bruit, no mass felt. Heme: No neck lymph node enlargement. Cardiac: S1/S2, RRR, No murmurs, No gallops or rubs. Pulm: No rales, wheezing, rhonchi or rubs. Abd: distended, tenderness diffusely, no rebound pain, no organomegaly, BS present. Ext: No pitting leg edema bilaterally. 2+DP/PT pulse bilaterally. Has tenderness over bilateral calf areas. Musculoskeletal: No joint deformities, No joint redness or warmth, no limitation of ROM in spin. Skin: No rashes.  Neuro: Alert, oriented X3, cranial nerves II-XII grossly intact, moves all extremities normally.  Psych: Patient is not psychotic, no suicidal or hemocidal ideation.  Labs on Admission:  Basic Metabolic Panel:  Recent Labs Lab 05/16/15 2248  NA 142  K 4.0  CL 109  CO2 24  GLUCOSE 147*  BUN 18  CREATININE 0.96  CALCIUM 10.2   Liver Function Tests: No results for input(s): AST, ALT,  ALKPHOS, BILITOT, PROT, ALBUMIN in the last 168 hours.  Recent Labs Lab 05/17/15 0209  LIPASE 30   No results for input(s): AMMONIA in the last 168 hours. CBC:  Recent Labs Lab 05/16/15 2248  WBC 9.2  HGB 13.5  HCT 41.5  MCV 87.9  PLT 245   Cardiac Enzymes:  Recent Labs Lab 05/17/15 0209  TROPONINI <  0.03    BNP (last 3 results) No results for input(s): BNP in the last 8760 hours.  ProBNP (last 3 results) No results for input(s): PROBNP in the last 8760 hours.  CBG:  Recent Labs Lab 05/17/15 0215  GLUCAP 107*    Radiological Exams on Admission: Dg Chest 2 View  05/16/2015  ADDENDUM REPORT: 05/16/2015 23:46 ADDENDUM: This addendum is created to include the abdominal radiograph that was performed concurrently with the two-view chest radiograph. CLINICAL DATA:  Constipation for 3 days. EXAM: Abdomen, one-view. COMPARISON:  CT 10/16/2014 FINDINGS: No dilated bowel loops. Moderate stool burden in the ascending and descending colon. No evidence of free air. No radiopaque calculi. No osseous abnormality. IMPRESSION: Moderate stool burden.  No bowel obstruction. Electronically Signed   By: Jeb Levering M.D.   On: 05/16/2015 23:46  05/16/2015  CLINICAL DATA:  Left-sided chest pain. EXAM: ABDOMEN - 1 VIEW; CHEST - 2 VIEW COMPARISON:  Radiographs and CT 10/09/2014 FINDINGS: The cardiomediastinal contours are normal. The lungs are clear. Pulmonary vasculature is normal. No consolidation, pleural effusion, or pneumothorax. No acute osseous abnormalities are seen. IMPRESSION: No acute pulmonary process. Electronically Signed: By: Jeb Levering M.D. On: 05/16/2015 23:40   Dg Abd 1 View  05/16/2015  ADDENDUM REPORT: 05/16/2015 23:46 ADDENDUM: This addendum is created to include the abdominal radiograph that was performed concurrently with the two-view chest radiograph. CLINICAL DATA:  Constipation for 3 days. EXAM: Abdomen, one-view. COMPARISON:  CT 10/16/2014 FINDINGS: No dilated  bowel loops. Moderate stool burden in the ascending and descending colon. No evidence of free air. No radiopaque calculi. No osseous abnormality. IMPRESSION: Moderate stool burden.  No bowel obstruction. Electronically Signed   By: Jeb Levering M.D.   On: 05/16/2015 23:46  05/16/2015  CLINICAL DATA:  Left-sided chest pain. EXAM: ABDOMEN - 1 VIEW; CHEST - 2 VIEW COMPARISON:  Radiographs and CT 10/09/2014 FINDINGS: The cardiomediastinal contours are normal. The lungs are clear. Pulmonary vasculature is normal. No consolidation, pleural effusion, or pneumothorax. No acute osseous abnormalities are seen. IMPRESSION: No acute pulmonary process. Electronically Signed: By: Jeb Levering M.D. On: 05/16/2015 23:40   Ct Angio Chest Pe W/cm &/or Wo Cm  05/17/2015  CLINICAL DATA:  Chest pain.  Constipation. EXAM: CT ANGIOGRAPHY CHEST CT ABDOMEN AND PELVIS WITH CONTRAST TECHNIQUE: Multidetector CT imaging of the chest was performed using the standard protocol during bolus administration of intravenous contrast. Multiplanar CT image reconstructions and MIPs were obtained to evaluate the vascular anatomy. Multidetector CT imaging of the abdomen and pelvis was performed using the standard protocol during bolus administration of intravenous contrast. CONTRAST:  129mL OMNIPAQUE IOHEXOL 350 MG/ML SOLN COMPARISON:  10/16/2014 FINDINGS: CTA CHEST FINDINGS Cardiovascular: There is good opacification of the pulmonary arteries. There is no pulmonary embolism. The thoracic aorta is normal in caliber and intact. Lungs: Clear Central airways: Patent Effusions: None Lymphadenopathy: None Esophagus: Unremarkable Musculoskeletal: No significant abnormality CT ABDOMEN and PELVIS FINDINGS Hepatobiliary: There are normal appearances of the liver, gallbladder and bile ducts. Pancreas: Normal Spleen: Normal Adrenals/Urinary Tract: The adrenals and kidneys are normal in appearance. There is no urinary calculus evident. There is no  hydronephrosis or ureteral dilatation. Collecting systems and ureters appear unremarkable. Stomach/Bowel: The stomach and small bowel are normal in appearance. Prior appendectomy. The colon is distended with a large volume of stool but is otherwise unremarkable. Vascular/Lymphatic: The abdominal aorta is normal in caliber. There is mild atherosclerotic calcification. There is no adenopathy in the abdomen or pelvis.  Reproductive: Hysterectomy.  No adnexal abnormalities. Other: No acute inflammatory changes are evident in the abdomen or pelvis. There is no ascites. Musculoskeletal: No significant abnormality Review of the MIP images confirms the above findings. IMPRESSION: No acute findings are evident in the chest, abdomen or pelvis. A large volume of stool distends the colon, consistent with constipation. Electronically Signed   By: Andreas Newport M.D.   On: 05/17/2015 04:17   Ct Abdomen Pelvis W Contrast  05/17/2015  CLINICAL DATA:  Chest pain.  Constipation. EXAM: CT ANGIOGRAPHY CHEST CT ABDOMEN AND PELVIS WITH CONTRAST TECHNIQUE: Multidetector CT imaging of the chest was performed using the standard protocol during bolus administration of intravenous contrast. Multiplanar CT image reconstructions and MIPs were obtained to evaluate the vascular anatomy. Multidetector CT imaging of the abdomen and pelvis was performed using the standard protocol during bolus administration of intravenous contrast. CONTRAST:  13mL OMNIPAQUE IOHEXOL 350 MG/ML SOLN COMPARISON:  10/16/2014 FINDINGS: CTA CHEST FINDINGS Cardiovascular: There is good opacification of the pulmonary arteries. There is no pulmonary embolism. The thoracic aorta is normal in caliber and intact. Lungs: Clear Central airways: Patent Effusions: None Lymphadenopathy: None Esophagus: Unremarkable Musculoskeletal: No significant abnormality CT ABDOMEN and PELVIS FINDINGS Hepatobiliary: There are normal appearances of the liver, gallbladder and bile ducts.  Pancreas: Normal Spleen: Normal Adrenals/Urinary Tract: The adrenals and kidneys are normal in appearance. There is no urinary calculus evident. There is no hydronephrosis or ureteral dilatation. Collecting systems and ureters appear unremarkable. Stomach/Bowel: The stomach and small bowel are normal in appearance. Prior appendectomy. The colon is distended with a large volume of stool but is otherwise unremarkable. Vascular/Lymphatic: The abdominal aorta is normal in caliber. There is mild atherosclerotic calcification. There is no adenopathy in the abdomen or pelvis. Reproductive: Hysterectomy.  No adnexal abnormalities. Other: No acute inflammatory changes are evident in the abdomen or pelvis. There is no ascites. Musculoskeletal: No significant abnormality Review of the MIP images confirms the above findings. IMPRESSION: No acute findings are evident in the chest, abdomen or pelvis. A large volume of stool distends the colon, consistent with constipation. Electronically Signed   By: Andreas Newport M.D.   On: 05/17/2015 04:17    Assessment/Plan Principal Problem:   Chest pain Active Problems:   Hypertension   Depression   Encounter for long-term opiate analgesic use   Generalized anxiety disorder   GERD (gastroesophageal reflux disease)   Non-insulin dependent type 2 diabetes mellitus (HCC)   History of migraine   Chronic low back pain (Location of Primary Pain) (Bilateral) (R>L)   Lumbar spondylosis (L3-4 & L4-5)   Abdominal pain   Chest pain: Patient has a atypical chest pain. Etiology is not clear. CT angiogram is negative for PE. No infiltration for pneumonia. Given her significant risk factors, including hypertension, hyperlipidemia and diabetes mellitus, will admit for chest pain rule out. - will admit to Tele bed  - cycle CE q6 x3 and repeat her EKG in the am  - Nitroglycerin, Morphine, and aspirin, pravastatin - Risk factor stratification: will check FLP, UDS and A1C  - 2d  echo - LE doppler to r/o DVT  Abdominal pain: Most likely due to constipation. -Check lipase -MiraLAX, lactulose, Dulcolax  HTN: - continue Verpamil,   Depression and anxiety: Stable, no suicidal or homicidal ideations. -Continue home medications: Wellbutrin  GERD: -Protonix and Pepcid  DM-II: Last A1c 6/1 on 06/19/12, well controled. Patient is taking metformin at home -SSI -Check A1c  Left knee pain: on lowe  dose prednisone per her ortho -prednisone 5 mg prn -prn Norco   DVT ppx: SQ Heparin (if pt develops severe chest pain or significantly elevated trop, will be easier to switch to IV heparin or stop heparin for procedure than using Lovenox).   Code Status: Full code Family Communication: None at bed side.  Disposition Plan: Admit to inpatient   Date of Service 05/17/2015    Ivor Costa [facility] Pager [phone]  If 7PM-7AM, please contact night-coverage [email or web address] Password [facility] 05/17/2015, 6:17 AM

## 2015-05-19 ENCOUNTER — Ambulatory Visit: Payer: Medicare Other | Attending: Pain Medicine | Admitting: Pain Medicine

## 2015-05-19 ENCOUNTER — Encounter: Payer: Self-pay | Admitting: Pain Medicine

## 2015-05-19 VITALS — BP 130/100 | HR 94 | Temp 98.5°F | Resp 18 | Ht 65.0 in | Wt 185.0 lb

## 2015-05-19 DIAGNOSIS — M533 Sacrococcygeal disorders, not elsewhere classified: Secondary | ICD-10-CM | POA: Insufficient documentation

## 2015-05-19 DIAGNOSIS — K589 Irritable bowel syndrome without diarrhea: Secondary | ICD-10-CM | POA: Insufficient documentation

## 2015-05-19 DIAGNOSIS — M2170 Unequal limb length (acquired), unspecified site: Secondary | ICD-10-CM | POA: Diagnosis not present

## 2015-05-19 DIAGNOSIS — I10 Essential (primary) hypertension: Secondary | ICD-10-CM | POA: Insufficient documentation

## 2015-05-19 DIAGNOSIS — M1611 Unilateral primary osteoarthritis, right hip: Secondary | ICD-10-CM | POA: Insufficient documentation

## 2015-05-19 DIAGNOSIS — Z96652 Presence of left artificial knee joint: Secondary | ICD-10-CM | POA: Diagnosis not present

## 2015-05-19 DIAGNOSIS — M545 Low back pain: Secondary | ICD-10-CM

## 2015-05-19 DIAGNOSIS — Z87891 Personal history of nicotine dependence: Secondary | ICD-10-CM | POA: Insufficient documentation

## 2015-05-19 DIAGNOSIS — F1190 Opioid use, unspecified, uncomplicated: Secondary | ICD-10-CM

## 2015-05-19 DIAGNOSIS — T402X5A Adverse effect of other opioids, initial encounter: Secondary | ICD-10-CM

## 2015-05-19 DIAGNOSIS — M79662 Pain in left lower leg: Secondary | ICD-10-CM | POA: Insufficient documentation

## 2015-05-19 DIAGNOSIS — G8929 Other chronic pain: Secondary | ICD-10-CM | POA: Diagnosis not present

## 2015-05-19 DIAGNOSIS — M160 Bilateral primary osteoarthritis of hip: Secondary | ICD-10-CM | POA: Diagnosis not present

## 2015-05-19 DIAGNOSIS — K5903 Drug induced constipation: Secondary | ICD-10-CM

## 2015-05-19 DIAGNOSIS — M25579 Pain in unspecified ankle and joints of unspecified foot: Secondary | ICD-10-CM | POA: Diagnosis present

## 2015-05-19 DIAGNOSIS — R079 Chest pain, unspecified: Secondary | ICD-10-CM | POA: Diagnosis not present

## 2015-05-19 DIAGNOSIS — E1140 Type 2 diabetes mellitus with diabetic neuropathy, unspecified: Secondary | ICD-10-CM | POA: Diagnosis not present

## 2015-05-19 DIAGNOSIS — F329 Major depressive disorder, single episode, unspecified: Secondary | ICD-10-CM | POA: Diagnosis not present

## 2015-05-19 DIAGNOSIS — Z5181 Encounter for therapeutic drug level monitoring: Secondary | ICD-10-CM

## 2015-05-19 DIAGNOSIS — Z79899 Other long term (current) drug therapy: Secondary | ICD-10-CM

## 2015-05-19 DIAGNOSIS — K5909 Other constipation: Secondary | ICD-10-CM | POA: Insufficient documentation

## 2015-05-19 DIAGNOSIS — F411 Generalized anxiety disorder: Secondary | ICD-10-CM | POA: Insufficient documentation

## 2015-05-19 DIAGNOSIS — K219 Gastro-esophageal reflux disease without esophagitis: Secondary | ICD-10-CM | POA: Insufficient documentation

## 2015-05-19 DIAGNOSIS — R51 Headache: Secondary | ICD-10-CM | POA: Insufficient documentation

## 2015-05-19 DIAGNOSIS — M47816 Spondylosis without myelopathy or radiculopathy, lumbar region: Secondary | ICD-10-CM | POA: Insufficient documentation

## 2015-05-19 DIAGNOSIS — G4710 Hypersomnia, unspecified: Secondary | ICD-10-CM | POA: Insufficient documentation

## 2015-05-19 DIAGNOSIS — Z79891 Long term (current) use of opiate analgesic: Secondary | ICD-10-CM

## 2015-05-19 LAB — HEMOGLOBIN A1C
Hgb A1c MFr Bld: 5.4 % (ref 4.8–5.6)
MEAN PLASMA GLUCOSE: 108 mg/dL

## 2015-05-19 MED ORDER — POLYETHYLENE GLYCOL 3350 17 GM/SCOOP PO POWD: 1.0000 | Freq: Once | ORAL | Status: DC

## 2015-05-19 MED ORDER — BISACODYL 5 MG PO TBEC: 10.0000 mg | DELAYED_RELEASE_TABLET | Freq: Every day | ORAL | Status: DC | PRN

## 2015-05-19 MED ORDER — HYDROCODONE-ACETAMINOPHEN 5-325 MG PO TABS: 1.0000 | ORAL_TABLET | Freq: Every day | ORAL | Status: DC | PRN

## 2015-05-19 MED ORDER — TRAMADOL HCL 50 MG PO TABS: 100.0000 mg | ORAL_TABLET | Freq: Three times a day (TID) | ORAL | Status: DC | PRN

## 2015-05-19 NOTE — Progress Notes (Signed)
Patient's Name: Heather Haley  Patient type: Established  MRN: [id number]  Service setting: Ambulatory outpatient  DOB: 1960/05/18  Location: [facility]  DOS: 05/19/2015  Primary Care Physician: Lottie Dawson, MD  Note by: Kathlen Brunswick Dossie Arbour, M.D, DABA, DABAPM, DABPM, DABIPP, FIPP  Referring Physician: Ronnell Guadalajara*  Specialty: Board-Certified Interventional Pain Management  Type of visit: Worker's Compensation    Primary Reason(s) for Visit: Encounter for prescription drug management (Level of risk: moderate) CC: Ankle Pain   HPI  Heather Haley is a 55 y.o. year old, female patient, who returns today as an established patient. She has ADVERSE DRUG REACTION; Hypertension; IBS (irritable bowel syndrome); Depression; Morbid obesity ([place]); Hypersomnia; Leg length inequality; Chest pressure; Other and unspecified angina pectoris; Encounter for therapeutic drug level monitoring; Encounter for long-term opiate analgesic use; Long-term current use of opiate analgesic; Uncomplicated opioid dependence ([place]); Opiate use (35 MME/Day); Chronic pain syndrome; Chronic constipation; Personal history of other diseases of the digestive system; Diabetic sensory peripheral neuropathy (Bilateral Lower Extremity); Generalized anxiety disorder; History of panic attacks; Insomnia; History of tobacco abuse; GERD (gastroesophageal reflux disease); Non-insulin dependent type 2 diabetes mellitus ([place]); Coccygeal pain; History of migraine; Chronic ankle pain (secondary to a work-related injury) (Date of injury 04/17/2006) (Left); Chronic low back pain (Location of Primary Pain) (Bilateral) (R>L); Lumbar spondylosis (L3-4 & L4-5); Lumbar facet syndrome (Location of Primary Source of Pain) (Bilateral) (R>L); Chronic pain; Headache, primary stabbing; Chronic hip pain (Right); Long term prescription opiate use; Chronic knee pain (S/P TKR:Total Knee Replacement) (Left);  Osteoarthritis of hips (Location of Secondary source of pain) (Bilateral) (Right); History of total knee replacement (Left); Chronic groin pain (Location of Secondary source of pain) (Right); Chronic lower extremity pain (Location of Tertiary source of pain) (Bilateral) (R>L); Osteoarthritis of hip (Right); Chest pain; Abdominal pain; Bilateral calf pain; and Opioid-induced constipation (OIC) on her problem list.. Her primarily concern today is the Ankle Pain   Pain Assessment: Self-Reported Pain Score: 7 , clinically she looks like a 2-3/10. Reported level is inconsistent with clinical obrservations Pain Type: Chronic pain Pain Location: Ankle Pain Orientation: Left Pain Descriptors / Indicators: Aching Pain Frequency: Constant  The patient returns to the clinics today for pharmacological management of her chronic left leg and ankle pain. The patient indicates that her current medication regimen seems to be working for that pain.  Date of Last Visit: 05/13/15 Service Provided on Last Visit: Procedure (hip injection)  Controlled Substance Pharmacotherapy Assessment  Analgesic: Hydrocodone/APAP 5/325 one tablet by mouth daily when necessary or pain (5 mg/day) + tramadol 50 mg 2 tablets by mouth every 8 hours when necessary for pain (300 mg/day) Pill Count: Hydrocodone #30.5/30 Filled 05/16/15. Tramadol # 138/180 Filled 05-09-15 MME/day: 35 mg/day.  Pharmacokinetics: Onset of action (Liberation/Absorption): Within expected pharmacological parameters Time to Peak effect (Distribution): Timing and results are as within normal expected parameters Duration of action (Metabolism/Excretion): Within normal limits for medication Pharmacodynamics: Analgesic Effect: More than 50% Activity Facilitation: Medication(s) allow patient to sit, stand, walk, and do the basic ADLs Perceived Effectiveness: Described as relatively effective, allowing for increase in activities of daily living (ADL) Side-effects  or Adverse reactions: None reported Monitoring: [place] PMP: Online review of the past 3-month period conducted. Compliant with practice rules and regulations UDS Results/interpretation: The patient's last UDS was done on 02/19/2015 and it came back within normal limits with no unexpected results. Medication Assessment Form: Reviewed. Patient indicates being compliant with therapy Treatment compliance:  Compliant Risk Assessment: Aberrant Behavior: None observed today Substance Use Disorder (SUD) Risk Level: Moderate Risk of opioid abuse or dependence: 0.7-3.0% with doses ? 36 MME/day and 6.1-26% with doses ? 120 MME/day. Opioid Risk Tool (ORT) Score:  4 Moderate Risk for SUD (Score between 4-7) Depression Scale Score: PHQ-2: PHQ-2 Total Score: 0 No depression (0) PHQ-9: PHQ-9 Total Score: 0 No depression (0-4)  Pharmacologic Plan: No change in therapy, at this time  Laboratory Chemistry  Inflammation Markers Lab Results  Component Value Date   ESRSEDRATE 8 09/14/2013   CRP 0.9* 05/24/2013    Renal Function Lab Results  Component Value Date   BUN 18 05/16/2015   CREATININE 0.96 05/16/2015   GFRAA >60 05/16/2015   GFRNONAA >60 05/16/2015    Hepatic Function Lab Results  Component Value Date   AST 17 10/15/2014   ALT 17 10/15/2014   ALBUMIN 3.7 10/15/2014    Electrolytes Lab Results  Component Value Date   NA 142 05/16/2015   K 4.0 05/16/2015   CL 109 05/16/2015   CALCIUM 10.2 05/16/2015   MG 2.0 03/12/2014    Pain Modulating Vitamins No results found for: [place], VD125OH2TOT, [id number], [id number], VITAMINB12  Coagulation Parameters Lab Results  Component Value Date   INR 1.06 05/17/2015   LABPROT 13.6 05/17/2015    Note: I personally reviewed the above data. Results shared with patient.  Meds  The patient has a current medication list which includes the following prescription(s): aspirin ec, bisacodyl, bupropion, buspirone, cetirizine, vitamin d,  clindamycin, fluticasone, hydrocodone-acetaminophen, hydrocodone-acetaminophen, hydrocodone-acetaminophen, hypromellose, lactulose, metformin, omeprazole, polyethylene glycol powder, pravastatin, prednisone, sumatriptan, topiramate, tramadol, trazodone, and verapamil hcl.  Current Outpatient Prescriptions on File Prior to Visit  Medication Sig  . aspirin EC 81 MG tablet Take 81 mg by mouth daily.  Marland Kitchen buPROPion (WELLBUTRIN XL) 150 MG 24 hr tablet Take 300 mg by mouth daily.   . cetirizine (ZYRTEC) 10 MG tablet Take 1 tablet (10 mg total) by mouth at bedtime. (Patient taking differently: Take 10 mg by mouth daily as needed for allergies. )  . Cholecalciferol (VITAMIN D) 2000 UNITS CAPS Take 2,000 Units by mouth daily.  . clindamycin (CLEOCIN) 300 MG capsule TK 2 CS PO 1 HOUR PRIOR TO DENTAL WORK  . fluticasone (FLONASE) 50 MCG/ACT nasal spray Place 2 sprays into both nostrils daily as needed for allergies.   . Hypromellose (ARTIFICIAL TEARS OP) Apply 2 drops to eye daily as needed (for dry eyes). Dry eye  . lactulose (CHRONULAC) 10 GM/15ML solution Take 10 g by mouth 2 (two) times daily as needed for mild constipation.  . metFORMIN (GLUCOPHAGE) 1000 MG tablet Take 1,000 mg by mouth 2 (two) times daily with a meal.   . omeprazole (PRILOSEC) 40 MG capsule Take 40 mg by mouth daily as needed (indigestion).   . pravastatin (PRAVACHOL) 40 MG tablet Take 40 mg by mouth at bedtime.  . predniSONE (DELTASONE) 5 MG tablet Take 5 mg by mouth at bedtime.   . SUMAtriptan (IMITREX) 100 MG tablet Take 1 tab at onset of headache, may take second dose after 1 hour. Do not take more than 2 a week  . topiramate (TOPAMAX) 100 MG tablet Take 200 mg by mouth 2 (two) times daily.  . traZODone (DESYREL) 50 MG tablet Take 50 mg by mouth at bedtime.  Marland Kitchen VERAPAMIL HCL ER, CO, PO Take 120 mg by mouth at bedtime.   No current facility-administered medications on file prior to visit.  ROS  Constitutional: Afebrile, no  chills, well hydrated and well nourished Gastrointestinal: No upper or lower GI bleeding, no nausea, no vomiting and no acute GI distress Musculoskeletal: No acute joint swelling or redness, no acute loss of range of motion and no acute onset weakness Neurological: Denies any acute onset apraxia, no episodes of paralysis, no acute loss of coordination, no acute loss of consciousness and no acute onset aphasia, dysarthria, agnosia, or amnesia  Allergies  Ms. Simental is allergic to buprenorphine hcl; codeine; cymbalta; duloxetine; gabapentin; lyrica; morphine and related; nortripytline hcl; nsaids; penicillin g; penicillins; and tolmetin.  [place]  Medical:  Ms. Booz  has a past medical history of Hypertension; IBS (irritable bowel syndrome); Postoperative nausea and vomiting; Diabetes mellitus ([place]); Left ankle instability; Headache(784.0); Left knee DJD; Depression; Frequency of urination; GERD (gastroesophageal reflux disease); High cholesterol; Neuromuscular disorder ([place]); Anxiety; Lumbar Degenerative Disc Disease of  (10/11/2014); Other enthesopathy of ankle and tarsus (12/15/2009); Osteoarthritis of hip (Right) (05/05/2015); and Peripheral sensory neuropathy (Bilateral) (11/19/2014). Family: family history includes Cancer in her mother; Heart disease in her father; Hypertension in her father. Surgical:  has past surgical history that includes Ankle surgery; Abdominal hysterectomy; Knee arthroscopy (1997); Total knee arthroplasty (08/30/2011); Colonoscopy (2013); Appendectomy; left heart catheterization with coronary angiogram (N/A, 01/11/2013); and Esophagogastroduodenoscopy (egd) with propofol (N/A, 10/03/2014). Tobacco:  reports that she quit smoking about 20 years ago. Her smoking use included Cigarettes. She has a 30 pack-year smoking history. She has never used smokeless tobacco. Alcohol:  reports that she does not drink alcohol. Drug:  reports that she does not use illicit drugs.  Physical  Examination  Constitutional Vitals:  Today's Vitals   05/19/15 1452 05/19/15 1455  BP: 130/100   Pulse: 94   Temp: 98.5 F (36.9 C)   Resp: 18   Height: 5\' 5"  (1.651 m)   Weight: 185 lb (83.915 kg)   SpO2: 95%   PainSc: 7  7   PainLoc: Ankle    Calculated BMI: Body mass index is 30.79 kg/(m^2).    General appearance: alert, cooperative, appears stated age, mild distress and mildly obese Eyes: PERLA Respiratory: No evidence respiratory distress, no audible rales or ronchi and no use of accessory muscles of respiration  Lumbar Spine  Inspection: No gross anomalies detected Alignment: Symetrical Functional ROM: Within functional limits (WFL) AROM: Decreased Palpation: Tender Provocative Tests: Lumbar Hyperextension and rotation test: deferred Patrick's Maneuver: deferred  Gait Assessment  Gait: WNL  Lower Extremities    Right  Left  Inspection: No gross anomalies detected  Inspection: No gross anomalies detected  Functional ROM: Within functional limits [facility])  Functional ROM: Within functional limits (WFL)  AROM: Adequate  AROM: Adequate  Sensory: Normal  Sensory: Normal  Motor: Unremarkable  Motor: Unremarkable  Toe walk (S1): WNL  Toe walk (S1): Incapable  Heal walk (L5): WNL  Heal walk (L5): Incapable   Assessment & Plan  Primary Diagnosis & Pertinent Problem List: The primary encounter diagnosis was Chronic pain. Diagnoses of Encounter for therapeutic drug level monitoring, Long term prescription opiate use, Chronic low back pain (Location of Primary Pain) (Bilateral) (R>L), Opioid-induced constipation (OIC), and Opiate use (35 MME/Day) were also pertinent to this visit.  Visit Diagnosis: 1. Chronic pain   2. Encounter for therapeutic drug level monitoring   3. Long term prescription opiate use   4. Chronic low back pain (Location of Primary Pain) (Bilateral) (R>L)   5. Opioid-induced constipation (OIC)   6. Opiate use (35 MME/Day)  Problem-specific  Plan(s): No problem-specific assessment & plan notes found for this encounter.   Plan of Care   Problem List Items Addressed This Visit      High   Chronic low back pain (Location of Primary Pain) (Bilateral) (R>L) (Chronic)   Relevant Medications   HYDROcodone-acetaminophen (NORCO/VICODIN) 5-325 MG tablet   HYDROcodone-acetaminophen (NORCO/VICODIN) 5-325 MG tablet   HYDROcodone-acetaminophen (NORCO/VICODIN) 5-325 MG tablet   traMADol (ULTRAM) 50 MG tablet   Chronic pain - Primary (Chronic)   Relevant Medications   HYDROcodone-acetaminophen (NORCO/VICODIN) 5-325 MG tablet   HYDROcodone-acetaminophen (NORCO/VICODIN) 5-325 MG tablet   HYDROcodone-acetaminophen (NORCO/VICODIN) 5-325 MG tablet   traMADol (ULTRAM) 50 MG tablet     Medium   Encounter for therapeutic drug level monitoring   Long term prescription opiate use (Chronic)   Opiate use (35 MME/Day) (Chronic)   Opioid-induced constipation (OIC) (Chronic)   Relevant Medications   bisacodyl (DULCOLAX) 5 MG EC tablet   polyethylene glycol powder (GLYCOLAX/MIRALAX) powder       Pharmacotherapy (Medications Ordered): Meds ordered this encounter  Medications  . HYDROcodone-acetaminophen (NORCO/VICODIN) 5-325 MG tablet    Sig: Take 1 tablet by mouth daily as needed for moderate pain or severe pain.    Dispense:  30 tablet    Refill:  0    Do not place this medication, or any other prescription from our practice, on "Automatic Refill". Patient may have prescription filled one day early if pharmacy is closed on scheduled refill date. Do not fill until: 05/19/15 To last until: 06/18/15  . HYDROcodone-acetaminophen (NORCO/VICODIN) 5-325 MG tablet    Sig: Take 1 tablet by mouth daily as needed for moderate pain or severe pain.    Dispense:  30 tablet    Refill:  0    Do not place this medication, or any other prescription from our practice, on "Automatic Refill". Patient may have prescription filled one day early if pharmacy is  closed on scheduled refill date. Do not fill until: 06/18/15 To last until: 07/18/15  . HYDROcodone-acetaminophen (NORCO/VICODIN) 5-325 MG tablet    Sig: Take 1 tablet by mouth daily as needed for moderate pain or severe pain.    Dispense:  30 tablet    Refill:  0    Do not place this medication, or any other prescription from our practice, on "Automatic Refill". Patient may have prescription filled one day early if pharmacy is closed on scheduled refill date. Do not fill until: 07/18/15 To last until: 08/17/15  . traMADol (ULTRAM) 50 MG tablet    Sig: Take 2 tablets (100 mg total) by mouth every 8 (eight) hours as needed for moderate pain or severe pain.    Dispense:  180 tablet    Refill:  2    Do not place this medication, or any other prescription from our practice, on "Automatic Refill". Patient may have prescription filled one day early if pharmacy is closed on scheduled refill date. Do not fill until: 05/19/15 To last until: 08/17/15  . bisacodyl (DULCOLAX) 5 MG EC tablet    Sig: Take 2 tablets (10 mg total) by mouth daily as needed for moderate constipation.    Dispense:  60 tablet    Refill:  2    Do not add this medication to the electronic "Automatic Refill" notification system. Patient may have prescription filled one day early if pharmacy is closed on scheduled refill date.  . polyethylene glycol powder (GLYCOLAX/MIRALAX) powder    Sig: Take 255 g by  mouth once.    Dispense:  255 g    Refill:  2    Do not add this medication to the electronic "Automatic Refill" notification system. Patient may have prescription filled one day early if pharmacy is closed on scheduled refill date.   Lab-work & Procedure Ordered: No orders of the defined types were placed in this encounter.    Imaging Ordered: None  Interventional Therapies: Scheduled:  None at this point    Considering:  None at this point.    PRN Procedures:  None at this point.    Referral(s) or Consult(s): None  at this time.  New Prescriptions   No medications on file    Medications administered during this visit: Ms. Fruehauf had no medications administered during this visit.  Future Appointments Date Time Provider [place]  05/29/2015 8:30 AM Amy Cletis [place], MD [facility]  06/10/2015 1:20 PM Milinda Pointer, MD [facility] None  08/18/2015 1:40 PM Milinda Pointer, MD [facility] None    Primary Care Physician: Kelton Pillar, Herschell Dimes, MD Location: [facility] Note by: Kathlen Brunswick Dossie Arbour, M.D, DABA, DABAPM, DABPM, DABIPP, FIPP  Pain Score Disclaimer: We use the NRS-11 scale. This is a self-reported, subjective measurement of pain severity with only modest accuracy. It is used primarily to identify changes within a particular patient. It must be understood that outpatient pain scales are significantly less accurate that those used for research, where they can be applied under ideal controlled circumstances with minimal exposure to variables. In reality, the score is likely to be a combination of pain intensity and pain affect, where pain affect describes the degree of emotional arousal or changes in action readiness caused by the sensory experience of pain. Factors such as social and work situation, setting, emotional state, anxiety levels, expectation, and prior pain experience may influence pain perception and show large inter-individual differences that may also be affected by time variables.

## 2015-05-19 NOTE — Progress Notes (Signed)
Safety precautions to be maintained throughout the outpatient stay will include: orient to surroundings, keep bed in low position, maintain call bell within reach at all times, provide assistance with transfer out of bed and ambulation. Hydrocodone #30.5/30   Filled 05/16/15 Tramadol # 138/180  Filled 05-09-15

## 2015-05-21 NOTE — Discharge Summary (Signed)
Physician Discharge Summary  Heather Haley [id number] DOB: April 01, 1960 DOA: 05/16/2015  PCP: Lottie Dawson, MD  Admit date: 05/16/2015 Discharge date: 05/21/2015  Time spent: 25 minutes  Recommendations for Outpatient Follow-up:  1. Left AMA.   Discharge Diagnoses:  Principal Problem:   Chest pain Active Problems:   Hypertension   Depression   Encounter for long-term opiate analgesic use   Generalized anxiety disorder   GERD (gastroesophageal reflux disease)   Non-insulin dependent type 2 diabetes mellitus (HCC)   History of migraine   Chronic low back pain (Location of Primary Pain) (Bilateral) (R>L)   Lumbar spondylosis (L3-4 & L4-5)   Abdominal pain   Discharge Condition: patient Left AMA. No instructions or changes in medications provided.  Filed Weights   05/17/15 0128  Weight: 85.73 kg (189 lb)    History of present illness:  As per H&P written by Dr. Blaine Hamper on 05/16/15 55 y.o. female with PMH of hypertension, hyperlipidemia, diabetes mellitus, GERD, gout, depression, anxiety, migraine headaches, IBS, back pain, bilateral peripheral neuropathy, opiate dependency, right leg DVT 2013, who presents with chest pain and abdominal pain.  Patient reports that she has been having intermittent moderate pain over left chest, left shoulder blade area for about 30 days, which has worsened today. The chest pain is pleuritic, is aggravated by deep breath. Patient does not have shortness of breath. She also has bilateral calf pain in the past 2 days. Patient does not have fever, chills, cough. Patient reports that she has been constipated for about 1 week. She has abdominal distention and whole abdominal pain. It is constant, 5 out of 10 in severity, associated with nausea, no vomiting. She does not have symptoms of UTI or unilateral weakness.  In ED, patient was found to have WBC 9.2, negative troponin, temperature normal, no tachycardia, electrolytes and renal function  okay, negative chest x-ray. Abdominal x-ray showed stool burden. CT angiogram of chest is negative for PE. CT abdomen is negative for acute intracranial abnormalities, but stool burden. Patient's admitted to inpatient for further evaluation and treatment and observation.  Hospital Course:  Chest pain: 2 troponin neg and no acute ischemic changes. Patient left AMA befor further assessment or plans could be made.  Procedures:  See below for x-ray reports   Consultations:  None   Discharge Exam: Filed Vitals:   05/17/15 0820 05/17/15 0933  BP: 99/56   Pulse: 64   Temp:  97.8 F (36.6 C)  Resp:  15    General: Patient left AMA; no ability to complete workup or physical exam.   Discharge Instructions    Discharge Medication List as of 05/17/2015 11:52 AM    CONTINUE these medications which have NOT CHANGED   Details  aspirin EC 81 MG tablet Take 81 mg by mouth daily., Starting 01/08/2013, Until Discontinued, Historical Med    buPROPion (WELLBUTRIN XL) 150 MG 24 hr tablet Take 300 mg by mouth daily. , Until Discontinued, Historical Med    cetirizine (ZYRTEC) 10 MG tablet Take 1 tablet (10 mg total) by mouth at bedtime., Until Discontinued, Normal    Cholecalciferol (VITAMIN D) 2000 UNITS CAPS Take 2,000 Units by mouth daily., Until Discontinued, Historical Med    clindamycin (CLEOCIN) 300 MG capsule TK 2 CS PO 1 HOUR PRIOR TO DENTAL WORK, Historical Med    fluticasone (FLONASE) 50 MCG/ACT nasal spray Place 2 sprays into both nostrils daily as needed for allergies. , Until Discontinued, Historical Med    Hypromellose (ARTIFICIAL  TEARS OP) Apply 2 drops to eye daily as needed (for dry eyes). Dry eye, Until Discontinued, Historical Med    lactulose (CHRONULAC) 10 GM/15ML solution Take 10 g by mouth 2 (two) times daily as needed for mild constipation., Until Discontinued, Historical Med    metFORMIN (GLUCOPHAGE) 1000 MG tablet Take 1,000 mg by mouth 2 (two) times daily with a  meal. , Until Discontinued, Historical Med    omeprazole (PRILOSEC) 40 MG capsule Take 40 mg by mouth daily as needed (indigestion). , Until Discontinued, Historical Med    pravastatin (PRAVACHOL) 40 MG tablet Take 40 mg by mouth at bedtime., Until Discontinued, Historical Med    predniSONE (DELTASONE) 5 MG tablet Take 5 mg by mouth at bedtime. , Until Discontinued, Historical Med    SUMAtriptan (IMITREX) 100 MG tablet Take 1 tab at onset of headache, may take second dose after 1 hour. Do not take more than 2 a week, Normal    topiramate (TOPAMAX) 100 MG tablet Take 200 mg by mouth 2 (two) times daily., Until Discontinued, Historical Med    traZODone (DESYREL) 50 MG tablet Take 50 mg by mouth at bedtime., Until Discontinued, Historical Med    VERAPAMIL HCL ER, CO, PO Take 120 mg by mouth at bedtime., Until Discontinued, Historical Med    bisacodyl (DULCOLAX) 5 MG EC tablet Take 5 mg by mouth daily as needed for moderate constipation., Until Discontinued, Historical Med    !! HYDROcodone-acetaminophen (NORCO/VICODIN) 5-325 MG tablet Take 1 tablet by mouth daily as needed for moderate pain or severe pain., Starting 02/19/2015, Until Discontinued, Print    ranitidine (ZANTAC) 150 MG tablet Take 150 mg by mouth as needed for heartburn. , Until Discontinued, Historical Med    traMADol (ULTRAM) 50 MG tablet Take 2 tablets (100 mg total) by mouth every 8 (eight) hours as needed for moderate pain or severe pain., Starting 02/19/2015, Until Discontinued, Print    !! HYDROcodone-acetaminophen (NORCO/VICODIN) 5-325 MG tablet Take 1 tablet by mouth daily as needed for moderate pain or severe pain., Starting 02/19/2015, Until Discontinued, Print    !! HYDROcodone-acetaminophen (NORCO/VICODIN) 5-325 MG tablet Take 1 tablet by mouth daily as needed for moderate pain or severe pain., Starting 02/19/2015, Until Discontinued, Print     !! - Potential duplicate medications found. Please discuss with provider.      Allergies  Allergen Reactions  . Buprenorphine Hcl Other (See Comments)    Unable to void  . Codeine Hives    REACTION: hives  . Cymbalta [Duloxetine Hcl] Other (See Comments)    Altered mental status  . Duloxetine Other (See Comments)    Altered mental status Alopecia, visual hallucinations, nightmares  . Gabapentin Swelling  . Lyrica [Pregabalin] Other (See Comments)    Alopecia, visual hallucinations, nightmares Altered mental status Alopecia, visual hallucinations, nightmares Altered mental status  . Morphine And Related Other (See Comments)    Unable to void  . Nortripytline Hcl [Nortriptyline] Other (See Comments)    Hair loss and night mares Hair loss and night mares  . Nsaids     REACTION: palpitations, diaphoresis  . Penicillin G Nausea And Vomiting  . Penicillins     REACTION: upset stomach  . Tolmetin Other (See Comments)    REACTION: palpitations, diaphoresis      The results of significant diagnostics from this hospitalization (including imaging, microbiology, ancillary and laboratory) are listed below for reference.    Significant Diagnostic Studies: Dg Chest 2 View  05/16/2015  ADDENDUM  REPORT: 05/16/2015 23:46 ADDENDUM: This addendum is created to include the abdominal radiograph that was performed concurrently with the two-view chest radiograph. CLINICAL DATA:  Constipation for 3 days. EXAM: Abdomen, one-view. COMPARISON:  CT 10/16/2014 FINDINGS: No dilated bowel loops. Moderate stool burden in the ascending and descending colon. No evidence of free air. No radiopaque calculi. No osseous abnormality. IMPRESSION: Moderate stool burden.  No bowel obstruction. Electronically Signed   By: Jeb Levering M.D.   On: 05/16/2015 23:46  05/16/2015  CLINICAL DATA:  Left-sided chest pain. EXAM: ABDOMEN - 1 VIEW; CHEST - 2 VIEW COMPARISON:  Radiographs and CT 10/09/2014 FINDINGS: The cardiomediastinal contours are normal. The lungs are clear. Pulmonary vasculature is  normal. No consolidation, pleural effusion, or pneumothorax. No acute osseous abnormalities are seen. IMPRESSION: No acute pulmonary process. Electronically Signed: By: Jeb Levering M.D. On: 05/16/2015 23:40   Dg Abd 1 View  05/16/2015  ADDENDUM REPORT: 05/16/2015 23:46 ADDENDUM: This addendum is created to include the abdominal radiograph that was performed concurrently with the two-view chest radiograph. CLINICAL DATA:  Constipation for 3 days. EXAM: Abdomen, one-view. COMPARISON:  CT 10/16/2014 FINDINGS: No dilated bowel loops. Moderate stool burden in the ascending and descending colon. No evidence of free air. No radiopaque calculi. No osseous abnormality. IMPRESSION: Moderate stool burden.  No bowel obstruction. Electronically Signed   By: Jeb Levering M.D.   On: 05/16/2015 23:46  05/16/2015  CLINICAL DATA:  Left-sided chest pain. EXAM: ABDOMEN - 1 VIEW; CHEST - 2 VIEW COMPARISON:  Radiographs and CT 10/09/2014 FINDINGS: The cardiomediastinal contours are normal. The lungs are clear. Pulmonary vasculature is normal. No consolidation, pleural effusion, or pneumothorax. No acute osseous abnormalities are seen. IMPRESSION: No acute pulmonary process. Electronically Signed: By: Jeb Levering M.D. On: 05/16/2015 23:40   Ct Angio Chest Pe W/cm &/or Wo Cm  05/17/2015  CLINICAL DATA:  Chest pain.  Constipation. EXAM: CT ANGIOGRAPHY CHEST CT ABDOMEN AND PELVIS WITH CONTRAST TECHNIQUE: Multidetector CT imaging of the chest was performed using the standard protocol during bolus administration of intravenous contrast. Multiplanar CT image reconstructions and MIPs were obtained to evaluate the vascular anatomy. Multidetector CT imaging of the abdomen and pelvis was performed using the standard protocol during bolus administration of intravenous contrast. CONTRAST:  136mL OMNIPAQUE IOHEXOL 350 MG/ML SOLN COMPARISON:  10/16/2014 FINDINGS: CTA CHEST FINDINGS Cardiovascular: There is good opacification of the  pulmonary arteries. There is no pulmonary embolism. The thoracic aorta is normal in caliber and intact. Lungs: Clear Central airways: Patent Effusions: None Lymphadenopathy: None Esophagus: Unremarkable Musculoskeletal: No significant abnormality CT ABDOMEN and PELVIS FINDINGS Hepatobiliary: There are normal appearances of the liver, gallbladder and bile ducts. Pancreas: Normal Spleen: Normal Adrenals/Urinary Tract: The adrenals and kidneys are normal in appearance. There is no urinary calculus evident. There is no hydronephrosis or ureteral dilatation. Collecting systems and ureters appear unremarkable. Stomach/Bowel: The stomach and small bowel are normal in appearance. Prior appendectomy. The colon is distended with a large volume of stool but is otherwise unremarkable. Vascular/Lymphatic: The abdominal aorta is normal in caliber. There is mild atherosclerotic calcification. There is no adenopathy in the abdomen or pelvis. Reproductive: Hysterectomy.  No adnexal abnormalities. Other: No acute inflammatory changes are evident in the abdomen or pelvis. There is no ascites. Musculoskeletal: No significant abnormality Review of the MIP images confirms the above findings. IMPRESSION: No acute findings are evident in the chest, abdomen or pelvis. A large volume of stool distends the colon, consistent with constipation. Electronically  Signed   By: Andreas Newport M.D.   On: 05/17/2015 04:17   Ct Abdomen Pelvis W Contrast  05/17/2015  CLINICAL DATA:  Chest pain.  Constipation. EXAM: CT ANGIOGRAPHY CHEST CT ABDOMEN AND PELVIS WITH CONTRAST TECHNIQUE: Multidetector CT imaging of the chest was performed using the standard protocol during bolus administration of intravenous contrast. Multiplanar CT image reconstructions and MIPs were obtained to evaluate the vascular anatomy. Multidetector CT imaging of the abdomen and pelvis was performed using the standard protocol during bolus administration of intravenous contrast.  CONTRAST:  165mL OMNIPAQUE IOHEXOL 350 MG/ML SOLN COMPARISON:  10/16/2014 FINDINGS: CTA CHEST FINDINGS Cardiovascular: There is good opacification of the pulmonary arteries. There is no pulmonary embolism. The thoracic aorta is normal in caliber and intact. Lungs: Clear Central airways: Patent Effusions: None Lymphadenopathy: None Esophagus: Unremarkable Musculoskeletal: No significant abnormality CT ABDOMEN and PELVIS FINDINGS Hepatobiliary: There are normal appearances of the liver, gallbladder and bile ducts. Pancreas: Normal Spleen: Normal Adrenals/Urinary Tract: The adrenals and kidneys are normal in appearance. There is no urinary calculus evident. There is no hydronephrosis or ureteral dilatation. Collecting systems and ureters appear unremarkable. Stomach/Bowel: The stomach and small bowel are normal in appearance. Prior appendectomy. The colon is distended with a large volume of stool but is otherwise unremarkable. Vascular/Lymphatic: The abdominal aorta is normal in caliber. There is mild atherosclerotic calcification. There is no adenopathy in the abdomen or pelvis. Reproductive: Hysterectomy.  No adnexal abnormalities. Other: No acute inflammatory changes are evident in the abdomen or pelvis. There is no ascites. Musculoskeletal: No significant abnormality Review of the MIP images confirms the above findings. IMPRESSION: No acute findings are evident in the chest, abdomen or pelvis. A large volume of stool distends the colon, consistent with constipation. Electronically Signed   By: Andreas Newport M.D.   On: 05/17/2015 04:17   Dg Hip Unilat W Or W/o Pelvis 2-3 Views Right  05/05/2015  CLINICAL DATA:  Hip pain. Chronic. No known injury. Initial evaluation . EXAM: DG HIP (WITH OR WITHOUT PELVIS) 2-3V RIGHT COMPARISON:  CT 10/16/2014. FINDINGS: Degenerative changes noted lumbar spine and both hips. No acute bony or joint abnormality identified. IMPRESSION: Degenerative changes lumbar spine and both  hips. No acute abnormality . Electronically Signed   By: Marcello Moores  Register   On: 05/05/2015 10:46    Microbiology: No results found for this or any previous visit (from the past 240 hour(s)).   Labs: Basic Metabolic Panel:  Recent Labs Lab 05/16/15 2248  NA 142  K 4.0  CL 109  CO2 24  GLUCOSE 147*  BUN 18  CREATININE 0.96  CALCIUM 10.2   Liver Function Tests: No results for input(s): AST, ALT, ALKPHOS, BILITOT, PROT, ALBUMIN in the last 168 hours.  Recent Labs Lab 05/17/15 0209  LIPASE 30   No results for input(s): AMMONIA in the last 168 hours. CBC:  Recent Labs Lab 05/16/15 2248  WBC 9.2  HGB 13.5  HCT 41.5  MCV 87.9  PLT 245   Cardiac Enzymes:  Recent Labs Lab 05/17/15 0209 05/17/15 0652  TROPONINI <0.03 <0.03   CBG:  Recent Labs Lab 05/17/15 0215 05/17/15 0737  GLUCAP 107* 117*    Signed:  Barton Dubois MD.  [facility] 05/21/2015, 12:18 AM

## 2015-05-27 ENCOUNTER — Encounter: Payer: Self-pay | Admitting: Pain Medicine

## 2015-05-29 ENCOUNTER — Telehealth: Payer: Self-pay | Admitting: Pain Medicine

## 2015-05-29 ENCOUNTER — Ambulatory Visit (INDEPENDENT_AMBULATORY_CARE_PROVIDER_SITE_OTHER): Payer: Medicare Other | Admitting: Family Medicine

## 2015-05-29 ENCOUNTER — Encounter: Payer: Self-pay | Admitting: Family Medicine

## 2015-05-29 ENCOUNTER — Telehealth: Payer: Self-pay | Admitting: *Deleted

## 2015-05-29 VITALS — BP 120/88 | HR 82 | Temp 97.6°F | Ht 63.0 in | Wt 189.0 lb

## 2015-05-29 DIAGNOSIS — Z79891 Long term (current) use of opiate analgesic: Secondary | ICD-10-CM

## 2015-05-29 DIAGNOSIS — G894 Chronic pain syndrome: Secondary | ICD-10-CM

## 2015-05-29 DIAGNOSIS — E7800 Pure hypercholesterolemia, unspecified: Secondary | ICD-10-CM | POA: Insufficient documentation

## 2015-05-29 DIAGNOSIS — I10 Essential (primary) hypertension: Secondary | ICD-10-CM

## 2015-05-29 DIAGNOSIS — E119 Type 2 diabetes mellitus without complications: Secondary | ICD-10-CM

## 2015-05-29 DIAGNOSIS — Z79899 Other long term (current) drug therapy: Secondary | ICD-10-CM | POA: Diagnosis not present

## 2015-05-29 DIAGNOSIS — T402X5A Adverse effect of other opioids, initial encounter: Secondary | ICD-10-CM

## 2015-05-29 DIAGNOSIS — F411 Generalized anxiety disorder: Secondary | ICD-10-CM

## 2015-05-29 DIAGNOSIS — K5903 Drug induced constipation: Secondary | ICD-10-CM | POA: Diagnosis not present

## 2015-05-29 LAB — HM DIABETES FOOT EXAM

## 2015-05-29 MED ORDER — BUPROPION HCL ER (XL) 150 MG PO TB24: 300.0000 mg | ORAL_TABLET | Freq: Every day | ORAL | Status: DC

## 2015-05-29 MED ORDER — BUSPIRONE HCL 10 MG PO TABS: 10.0000 mg | ORAL_TABLET | Freq: Two times a day (BID) | ORAL | Status: DC

## 2015-05-29 MED ORDER — BUPROPION HCL ER (XL) 300 MG PO TB24: 300.0000 mg | ORAL_TABLET | Freq: Every day | ORAL | Status: DC

## 2015-05-29 NOTE — Telephone Encounter (Signed)
Left message for Heather Haley to return my call.

## 2015-05-29 NOTE — Patient Instructions (Addendum)
Increase Buspar to 10 mg twice daily. Continue wellbutrin. Continue working on healthy eating and weight loss.

## 2015-05-29 NOTE — Telephone Encounter (Signed)
Spoke with Ms. Heather Haley.  She states she is currently taking Bupropion 300 mg one tablet daily.  Medication list updated and prescription resent to pharmacy for Bupropion 300 mg to take one tablet daily.

## 2015-05-29 NOTE — Assessment & Plan Note (Signed)
BP well controlled on verapamil

## 2015-05-29 NOTE — Telephone Encounter (Signed)
Okay to make change if pt agreeable.

## 2015-05-29 NOTE — Progress Notes (Signed)
Pre visit review using our clinic review tool, if applicable. No additional management support is needed unless otherwise documented below in the visit note. 

## 2015-05-29 NOTE — Assessment & Plan Note (Signed)
Well controlled on current meds.. Maybe to tight control will follow.

## 2015-05-29 NOTE — Assessment & Plan Note (Signed)
Increase buspar to 10 mg BID and continue wellbutrin

## 2015-05-29 NOTE — Assessment & Plan Note (Signed)
Stable control on miralax and dulcolax.
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
## 2015-05-29 NOTE — Telephone Encounter (Signed)
Patient having a Migraine and would like to have procedure in neck, when could she come in

## 2015-05-29 NOTE — Assessment & Plan Note (Signed)
Stable on tramadol and hydrocodone. Followed and meds prescribed by chronic pain MD Dr. Dossie Arbour.

## 2015-05-29 NOTE — Telephone Encounter (Signed)
Received PA from [employer].  Attempted to complete on CoverMyMeds.  Patient is currently taking Bupropion ER 150 mg two tablets daily.  They want to know if it would be appropriate to change to the 300 mg tablet so patient will only need to take one talbet daily.  Please advise.

## 2015-05-29 NOTE — Assessment & Plan Note (Signed)
Well controlled on pravastatin.. Continue current medication.

## 2015-05-30 ENCOUNTER — Telehealth: Payer: Self-pay | Admitting: *Deleted

## 2015-05-30 NOTE — Telephone Encounter (Signed)
Spoke with Dr Dossie Arbour and patient needs to be seen for evaluation of this problem.

## 2015-06-03 ENCOUNTER — Telehealth: Payer: Self-pay | Admitting: Pain Medicine

## 2015-06-03 NOTE — Telephone Encounter (Signed)
Wants to speak with Kori per vm left on Monday at 2:40

## 2015-06-03 NOTE — Telephone Encounter (Signed)
Spoke with patient and she states "Tell Dr Dossie Arbour do not pass any word to any doctor until we talk."  States "That is aLL"  Routed to Dr Dossie Arbour

## 2015-06-10 ENCOUNTER — Ambulatory Visit: Payer: Medicare Other | Attending: Pain Medicine | Admitting: Pain Medicine

## 2015-06-10 ENCOUNTER — Encounter: Payer: Self-pay | Admitting: Pain Medicine

## 2015-06-10 VITALS — BP 113/83 | HR 73 | Temp 97.8°F | Resp 16 | Ht 65.0 in | Wt 190.0 lb

## 2015-06-10 DIAGNOSIS — M2170 Unequal limb length (acquired), unspecified site: Secondary | ICD-10-CM | POA: Insufficient documentation

## 2015-06-10 DIAGNOSIS — K219 Gastro-esophageal reflux disease without esophagitis: Secondary | ICD-10-CM | POA: Diagnosis not present

## 2015-06-10 DIAGNOSIS — M533 Sacrococcygeal disorders, not elsewhere classified: Secondary | ICD-10-CM | POA: Diagnosis not present

## 2015-06-10 DIAGNOSIS — Z87891 Personal history of nicotine dependence: Secondary | ICD-10-CM | POA: Diagnosis not present

## 2015-06-10 DIAGNOSIS — Z90710 Acquired absence of both cervix and uterus: Secondary | ICD-10-CM | POA: Insufficient documentation

## 2015-06-10 DIAGNOSIS — Z7984 Long term (current) use of oral hypoglycemic drugs: Secondary | ICD-10-CM | POA: Insufficient documentation

## 2015-06-10 DIAGNOSIS — E1140 Type 2 diabetes mellitus with diabetic neuropathy, unspecified: Secondary | ICD-10-CM | POA: Insufficient documentation

## 2015-06-10 DIAGNOSIS — Z79891 Long term (current) use of opiate analgesic: Secondary | ICD-10-CM | POA: Diagnosis not present

## 2015-06-10 DIAGNOSIS — G43909 Migraine, unspecified, not intractable, without status migrainosus: Secondary | ICD-10-CM | POA: Diagnosis not present

## 2015-06-10 DIAGNOSIS — G8929 Other chronic pain: Secondary | ICD-10-CM | POA: Insufficient documentation

## 2015-06-10 DIAGNOSIS — F1190 Opioid use, unspecified, uncomplicated: Secondary | ICD-10-CM | POA: Diagnosis not present

## 2015-06-10 DIAGNOSIS — X58XXXA Exposure to other specified factors, initial encounter: Secondary | ICD-10-CM | POA: Diagnosis not present

## 2015-06-10 DIAGNOSIS — K5909 Other constipation: Secondary | ICD-10-CM | POA: Insufficient documentation

## 2015-06-10 DIAGNOSIS — M160 Bilateral primary osteoarthritis of hip: Secondary | ICD-10-CM | POA: Diagnosis not present

## 2015-06-10 DIAGNOSIS — M47816 Spondylosis without myelopathy or radiculopathy, lumbar region: Secondary | ICD-10-CM | POA: Diagnosis not present

## 2015-06-10 DIAGNOSIS — T50905A Adverse effect of unspecified drugs, medicaments and biological substances, initial encounter: Secondary | ICD-10-CM | POA: Diagnosis not present

## 2015-06-10 DIAGNOSIS — Z96652 Presence of left artificial knee joint: Secondary | ICD-10-CM | POA: Insufficient documentation

## 2015-06-10 DIAGNOSIS — K589 Irritable bowel syndrome without diarrhea: Secondary | ICD-10-CM | POA: Diagnosis not present

## 2015-06-10 DIAGNOSIS — I10 Essential (primary) hypertension: Secondary | ICD-10-CM | POA: Insufficient documentation

## 2015-06-10 DIAGNOSIS — M545 Low back pain: Secondary | ICD-10-CM | POA: Diagnosis not present

## 2015-06-10 DIAGNOSIS — F329 Major depressive disorder, single episode, unspecified: Secondary | ICD-10-CM | POA: Insufficient documentation

## 2015-06-10 DIAGNOSIS — Z9049 Acquired absence of other specified parts of digestive tract: Secondary | ICD-10-CM | POA: Diagnosis not present

## 2015-06-10 DIAGNOSIS — M25579 Pain in unspecified ankle and joints of unspecified foot: Secondary | ICD-10-CM | POA: Insufficient documentation

## 2015-06-10 DIAGNOSIS — M25551 Pain in right hip: Secondary | ICD-10-CM | POA: Insufficient documentation

## 2015-06-10 DIAGNOSIS — F411 Generalized anxiety disorder: Secondary | ICD-10-CM | POA: Insufficient documentation

## 2015-06-10 DIAGNOSIS — E7800 Pure hypercholesterolemia, unspecified: Secondary | ICD-10-CM | POA: Diagnosis not present

## 2015-06-10 DIAGNOSIS — M549 Dorsalgia, unspecified: Secondary | ICD-10-CM | POA: Diagnosis present

## 2015-06-10 NOTE — Progress Notes (Signed)
Safety precautions to be maintained throughout the outpatient stay will include: orient to surroundings, keep bed in low position, maintain call bell within reach at all times, provide assistance with transfer out of bed and ambulation.  

## 2015-06-10 NOTE — Progress Notes (Signed)
Patient's Name: Heather Haley  Patient type: Established  MRN: [id number]  Service setting: Ambulatory outpatient  DOB: [birth year]  Location: [facility]  DOS: 06/10/2015  Primary Care Physician: Eliezer Lofts, MD  Note by: Kathlen Brunswick. Dossie Arbour, M.D, DABA, DABAPM, DABPM, Milagros Evener, FIPP  Referring Physician: Ronnell Guadalajara*  Specialty: Board-Certified Interventional Pain Management  Last Visit to Pain Management: 06/03/2015   Primary Reason(s) for Visit: Chronic pain management evaluation CC: Back Pain   HPI  Ms. Mactaggart is a 55 y.o. year old, female patient, who returns today as an established patient. She has ADVERSE DRUG REACTION; Hypertension; IBS (irritable bowel syndrome); Depression; Morbid obesity ([place]); Leg length inequality; Encounter for therapeutic drug level monitoring; Encounter for long-term opiate analgesic use; Long-term current use of opiate analgesic; Uncomplicated opioid dependence ([place]); Opiate use (35 MME/Day); Chronic pain syndrome; Chronic constipation; Personal history of other diseases of the digestive system; Diabetic sensory peripheral neuropathy (Bilateral Lower Extremity); Generalized anxiety disorder; History of panic attacks; History of tobacco abuse; GERD (gastroesophageal reflux disease); Non-insulin dependent type 2 diabetes mellitus ([place]); Coccygeal pain; History of migraine; Chronic ankle pain (secondary to a work-related injury) (Date of injury 04/17/2006) (Left); Chronic low back pain (Location of Primary Pain) (Bilateral) (R>L); Lumbar spondylosis (L3-4 & L4-5); Lumbar facet syndrome (Location of Primary Source of Pain) (Bilateral) (R>L); Chronic pain; Chronic hip pain (Right); Long term prescription opiate use; Chronic knee pain (S/P TKR:Total Knee Replacement) (Left); Osteoarthritis of hips (Location of Secondary source of pain) (Bilateral) (Right); History of total knee replacement (Left); Chronic groin pain (Location of  Secondary source of pain) (Right); Chronic lower extremity pain (Location of Tertiary source of pain) (Bilateral) (R>L); Osteoarthritis of hip (Right); Opioid-induced constipation (OIC); and Pure hypercholesterolemia on her problem list.. Her primarily concern today is the Back Pain   Pain Assessment: Self-Reported Pain Score: 5  Reported level is compatible with observation Pain Type: Chronic pain Pain Location: Back Pain Orientation: Lower Pain Descriptors / Indicators: Numbness Pain Frequency: Constant  The patient scheduled this appointment today specifically to talk to me about some issues that she thought were important for me to know. She brought me her medical bills for me to look at them so that I could tell her were her co-pays were being sent to. Right off the bat I had to inform the patient that I had no idea where any of the money goes and that I simply worked for the hospital. She seems to think that she is getting the run around because she asked Jocelyn Lamer, our nurse in charge to the clinic where the money was going and she apparently told her that this was a physician charge. The patient understood that I personally was charging for it and when we tried to explain that was not the case, she became agitated and insisted that we were lying to her. Unfortunately, all of her visits to my practice lately have had tone of discontent on her part and I am constantly being questioned about administrative issues that I simply have no involvement with. In addition, the patient has 2 problems, one of which is color by [employer] and the other one by her regular insurance. Apparently she was told by [employer] that they would not pay for anything that was unrelated to her injury and the patient has taken this to extremes. When she comes in for a Worker's Compensation visit she will not talk to [place] about any of her other medical issues  and then when she comes in for a regular visit,  then she doesn't want to talk about any other Worker's Compensation problems. Needless to say this is extremely confusing to me and she seems to think that there is some type of agenda or conspiracy. This has gotten to the point where I truly believe that she would probably be better served by seeing a psychiatrist instead of seeing me. Because of this, today I have informed the patient that I rather not continue being her pain physician and that I recommend that she follow up with somebody else. She is actually seen a pain physician in [place] for Worker's Compensation case and I'm much rather have them take over the whole thing. The patient has requested that I stay as her pain physician until she can get her lawyer to request another physician. I have agreed to do this, but I have informed the patient that I will limit my involvement to simply refilling her medications and that I am no longer willing to participate in visits like today's where we discussed no medical issues and simply spent all of our time talking about what other physicians are saying about me, which at this point I'm pretty sure she has simply misinterpreted, as it is usually the case.  After I thought we had taking care of all of this, and I was already seen another patient, the patient came back and asked me to follow her to Vicki's office where the patient blamed the every for lying to her and blamed her for me wanting to terminate our patient physician relationship. After doing that, she simply walked out of the office not allowing any of [place] also to clarify anything for her. This type of behavior is very disruptive to my clinic and in the past she has simply shown up to our practice without an appointment demanding to talk to me. I simply can no longer permit this and because of this I have requested that we terminate this patient physician relationship.  Date of Last Visit:  (05-13-15  right hip injection.  100% 1st hour, 4-6 hours  0%, 75% long term relief.  ) Service Provided on Last Visit: Med Refill  Controlled Substance Pharmacotherapy Assessment & REMS (Risk Evaluation and Mitigation Strategy)  Analgesic: Hydrocodone/APAP 5/325 one daily (5 mg/day of hydrocodone) + tramadol 100 mg every 8 hours (300 mg/day) Pill Count: The patient did not bring her medications to this appointment since it was not a refill appointment. She has enough medications to last until 08/17/2015. MME/day: 35 mg/day Monitoring: [place] PMP: Online review of the past 65-month period conducted. Compliant with practice rules and regulations UDS Results/interpretation: The patient's last UDS was done on 02/19/2015 and it came back within normal limits with no unexpected results. Medication Assessment Form: Reviewed. Patient indicates being compliant with therapy Treatment compliance: Compliant Risk Assessment: Aberrant Behavior: None observed today Substance Use Disorder (SUD) Risk Level: No change since last visit Risk of opioid abuse or dependence: 0.7-3.0% with doses ? 36 MME/day and 6.1-26% with doses ? 120 MME/day. Opioid Risk Tool (ORT) Score:  4 Moderate Risk for SUD (Score between 4-7) Depression Scale Score: PHQ-2: PHQ-2 Total Score: 0 No depression (0) PHQ-9: PHQ-9 Total Score: 0 No depression (0-4)  Pharmacologic Plan: No change in therapy, at this time  Laboratory Chemistry  Inflammation Markers Lab Results  Component Value Date   ESRSEDRATE 8 09/14/2013   CRP 0.9* 05/24/2013    Renal Function Lab Results  Component Value Date   BUN 18 05/16/2015   CREATININE 0.96 05/16/2015   GFRAA >60 05/16/2015   GFRNONAA >60 05/16/2015    Hepatic Function Lab Results  Component Value Date   AST 17 10/15/2014   ALT 17 10/15/2014   ALBUMIN 3.7 10/15/2014    Electrolytes Lab Results  Component Value Date   NA 142 05/16/2015   K 4.0 05/16/2015   CL 109 05/16/2015   CALCIUM 10.2 05/16/2015   MG 2.0 03/12/2014    Pain  Modulating Vitamins No results found for: [place], [id number], [id number], [id number], VITAMINB12  Coagulation Parameters Lab Results  Component Value Date   INR 1.06 05/17/2015   LABPROT 13.6 05/17/2015    Note: I personally reviewed the above data. Results made available to patient.  Recent Diagnostic Imaging  Dg Chest 2 View  05/16/2015  ADDENDUM REPORT: 05/16/2015 23:46 ADDENDUM: This addendum is created to include the abdominal radiograph that was performed concurrently with the two-view chest radiograph. CLINICAL DATA:  Constipation for 3 days. EXAM: Abdomen, one-view. COMPARISON:  CT 10/16/2014 FINDINGS: No dilated bowel loops. Moderate stool burden in the ascending and descending colon. No evidence of free air. No radiopaque calculi. No osseous abnormality. IMPRESSION: Moderate stool burden.  No bowel obstruction. Electronically Signed   By: Jeb Levering M.D.   On: 05/16/2015 23:46  05/16/2015  CLINICAL DATA:  Left-sided chest pain. EXAM: ABDOMEN - 1 VIEW; CHEST - 2 VIEW COMPARISON:  Radiographs and CT 10/09/2014 FINDINGS: The cardiomediastinal contours are normal. The lungs are clear. Pulmonary vasculature is normal. No consolidation, pleural effusion, or pneumothorax. No acute osseous abnormalities are seen. IMPRESSION: No acute pulmonary process. Electronically Signed: By: Jeb Levering M.D. On: 05/16/2015 23:40   Dg Abd 1 View  05/16/2015  ADDENDUM REPORT: 05/16/2015 23:46 ADDENDUM: This addendum is created to include the abdominal radiograph that was performed concurrently with the two-view chest radiograph. CLINICAL DATA:  Constipation for 3 days. EXAM: Abdomen, one-view. COMPARISON:  CT 10/16/2014 FINDINGS: No dilated bowel loops. Moderate stool burden in the ascending and descending colon. No evidence of free air. No radiopaque calculi. No osseous abnormality. IMPRESSION: Moderate stool burden.  No bowel obstruction. Electronically Signed   By: Jeb Levering M.D.   On:  05/16/2015 23:46  05/16/2015  CLINICAL DATA:  Left-sided chest pain. EXAM: ABDOMEN - 1 VIEW; CHEST - 2 VIEW COMPARISON:  Radiographs and CT 10/09/2014 FINDINGS: The cardiomediastinal contours are normal. The lungs are clear. Pulmonary vasculature is normal. No consolidation, pleural effusion, or pneumothorax. No acute osseous abnormalities are seen. IMPRESSION: No acute pulmonary process. Electronically Signed: By: Jeb Levering M.D. On: 05/16/2015 23:40   Ct Angio Chest Pe W/cm &/or Wo Cm  05/17/2015  CLINICAL DATA:  Chest pain.  Constipation. EXAM: CT ANGIOGRAPHY CHEST CT ABDOMEN AND PELVIS WITH CONTRAST TECHNIQUE: Multidetector CT imaging of the chest was performed using the standard protocol during bolus administration of intravenous contrast. Multiplanar CT image reconstructions and MIPs were obtained to evaluate the vascular anatomy. Multidetector CT imaging of the abdomen and pelvis was performed using the standard protocol during bolus administration of intravenous contrast. CONTRAST:  159mL OMNIPAQUE IOHEXOL 350 MG/ML SOLN COMPARISON:  10/16/2014 FINDINGS: CTA CHEST FINDINGS Cardiovascular: There is good opacification of the pulmonary arteries. There is no pulmonary embolism. The thoracic aorta is normal in caliber and intact. Lungs: Clear Central airways: Patent Effusions: None Lymphadenopathy: None Esophagus: Unremarkable Musculoskeletal: No significant abnormality CT ABDOMEN and PELVIS FINDINGS Hepatobiliary: There are normal appearances of  the liver, gallbladder and bile ducts. Pancreas: Normal Spleen: Normal Adrenals/Urinary Tract: The adrenals and kidneys are normal in appearance. There is no urinary calculus evident. There is no hydronephrosis or ureteral dilatation. Collecting systems and ureters appear unremarkable. Stomach/Bowel: The stomach and small bowel are normal in appearance. Prior appendectomy. The colon is distended with a large volume of stool but is otherwise unremarkable.  Vascular/Lymphatic: The abdominal aorta is normal in caliber. There is mild atherosclerotic calcification. There is no adenopathy in the abdomen or pelvis. Reproductive: Hysterectomy.  No adnexal abnormalities. Other: No acute inflammatory changes are evident in the abdomen or pelvis. There is no ascites. Musculoskeletal: No significant abnormality Review of the MIP images confirms the above findings. IMPRESSION: No acute findings are evident in the chest, abdomen or pelvis. A large volume of stool distends the colon, consistent with constipation. Electronically Signed   By: Andreas Newport M.D.   On: 05/17/2015 04:17   Ct Abdomen Pelvis W Contrast  05/17/2015  CLINICAL DATA:  Chest pain.  Constipation. EXAM: CT ANGIOGRAPHY CHEST CT ABDOMEN AND PELVIS WITH CONTRAST TECHNIQUE: Multidetector CT imaging of the chest was performed using the standard protocol during bolus administration of intravenous contrast. Multiplanar CT image reconstructions and MIPs were obtained to evaluate the vascular anatomy. Multidetector CT imaging of the abdomen and pelvis was performed using the standard protocol during bolus administration of intravenous contrast. CONTRAST:  130mL OMNIPAQUE IOHEXOL 350 MG/ML SOLN COMPARISON:  10/16/2014 FINDINGS: CTA CHEST FINDINGS Cardiovascular: There is good opacification of the pulmonary arteries. There is no pulmonary embolism. The thoracic aorta is normal in caliber and intact. Lungs: Clear Central airways: Patent Effusions: None Lymphadenopathy: None Esophagus: Unremarkable Musculoskeletal: No significant abnormality CT ABDOMEN and PELVIS FINDINGS Hepatobiliary: There are normal appearances of the liver, gallbladder and bile ducts. Pancreas: Normal Spleen: Normal Adrenals/Urinary Tract: The adrenals and kidneys are normal in appearance. There is no urinary calculus evident. There is no hydronephrosis or ureteral dilatation. Collecting systems and ureters appear unremarkable. Stomach/Bowel: The  stomach and small bowel are normal in appearance. Prior appendectomy. The colon is distended with a large volume of stool but is otherwise unremarkable. Vascular/Lymphatic: The abdominal aorta is normal in caliber. There is mild atherosclerotic calcification. There is no adenopathy in the abdomen or pelvis. Reproductive: Hysterectomy.  No adnexal abnormalities. Other: No acute inflammatory changes are evident in the abdomen or pelvis. There is no ascites. Musculoskeletal: No significant abnormality Review of the MIP images confirms the above findings. IMPRESSION: No acute findings are evident in the chest, abdomen or pelvis. A large volume of stool distends the colon, consistent with constipation. Electronically Signed   By: Andreas Newport M.D.   On: 05/17/2015 04:17    Meds  The patient has a current medication list which includes the following prescription(s): aspirin ec, bisacodyl, bupropion, buspirone, cetirizine, vitamin d, clindamycin, docusate sodium, fluticasone, hydrocodone-acetaminophen, hypromellose, lactulose, metformin, omeprazole, polyethylene glycol powder, pravastatin, ranitidine, sumatriptan, topiramate, tramadol, trazodone, and verapamil hcl.  Current Outpatient Prescriptions on File Prior to Visit  Medication Sig  . aspirin EC 81 MG tablet Take 81 mg by mouth daily.  . bisacodyl (DULCOLAX) 5 MG EC tablet Take 2 tablets (10 mg total) by mouth daily as needed for moderate constipation.  Marland Kitchen buPROPion (WELLBUTRIN XL) 300 MG 24 hr tablet Take 1 tablet (300 mg total) by mouth daily.  . busPIRone (BUSPAR) 10 MG tablet Take 1 tablet (10 mg total) by mouth 2 (two) times daily.  . cetirizine (ZYRTEC) 10 MG  tablet Take 1 tablet (10 mg total) by mouth at bedtime. (Patient taking differently: Take 10 mg by mouth daily as needed for allergies. )  . Cholecalciferol (VITAMIN D) 2000 UNITS CAPS Take 2,000 Units by mouth daily.  . clindamycin (CLEOCIN) 300 MG capsule TK 2 CS PO 1 HOUR PRIOR TO  DENTAL WORK  . docusate sodium (COLACE) 100 MG capsule Take 100 mg by mouth daily.  . fluticasone (FLONASE) 50 MCG/ACT nasal spray Place 2 sprays into both nostrils daily as needed for allergies.   Marland Kitchen HYDROcodone-acetaminophen (NORCO/VICODIN) 5-325 MG tablet Take 1 tablet by mouth daily as needed for moderate pain or severe pain.  . Hypromellose (ARTIFICIAL TEARS OP) Apply 2 drops to eye daily as needed (for dry eyes). Dry eye  . lactulose (CHRONULAC) 10 GM/15ML solution Take 10 g by mouth 2 (two) times daily as needed for mild constipation.  . metFORMIN (GLUCOPHAGE) 1000 MG tablet Take 1,000 mg by mouth 2 (two) times daily with a meal.   . omeprazole (PRILOSEC) 40 MG capsule Take 40 mg by mouth daily as needed (indigestion).   . polyethylene glycol powder (GLYCOLAX/MIRALAX) powder Take 255 g by mouth once.  . pravastatin (PRAVACHOL) 40 MG tablet Take 40 mg by mouth at bedtime.  . ranitidine (ZANTAC) 150 MG capsule Take 150 mg by mouth as needed for heartburn.  . SUMAtriptan (IMITREX) 100 MG tablet Take 1 tab at onset of headache, may take second dose after 1 hour. Do not take more than 2 a week  . topiramate (TOPAMAX) 100 MG tablet Take 200 mg by mouth 2 (two) times daily.  . traMADol (ULTRAM) 50 MG tablet Take 2 tablets (100 mg total) by mouth every 8 (eight) hours as needed for moderate pain or severe pain.  . traZODone (DESYREL) 50 MG tablet Take 50 mg by mouth at bedtime.  Marland Kitchen VERAPAMIL HCL ER, CO, PO Take 120 mg by mouth at bedtime.   No current facility-administered medications on file prior to visit.   Allergies  Ms. Cubillas is allergic to buprenorphine hcl; codeine; cymbalta; duloxetine; gabapentin; lyrica; morphine and related; nortripytline hcl; nsaids; penicillin g; penicillins; and tolmetin.  [place]  Medical:  Ms. Emmi  has a past medical history of Hypertension; IBS (irritable bowel syndrome); Postoperative nausea and vomiting; Diabetes mellitus ([place]); Left ankle  instability; Headache(784.0); Left knee DJD; Depression; Frequency of urination; GERD (gastroesophageal reflux disease); High cholesterol; Neuromuscular disorder ([place]); Anxiety; Lumbar Degenerative Disc Disease of  (10/11/2014); Other enthesopathy of ankle and tarsus (12/15/2009); Osteoarthritis of hip (Right) (05/05/2015); and Peripheral sensory neuropathy (Bilateral) (11/19/2014). Family: family history includes Cancer in her mother; Heart disease in her father; Hypertension in her father. Surgical:  has past surgical history that includes Ankle surgery; Abdominal hysterectomy; Knee arthroscopy (1997); Total knee arthroplasty (08/30/2011); Colonoscopy (2013); Appendectomy; left heart catheterization with coronary angiogram (N/A, 01/11/2013); and Esophagogastroduodenoscopy (egd) with propofol (N/A, 10/03/2014). Tobacco:  reports that she quit smoking about 20 years ago. Her smoking use included Cigarettes. She has a 30 pack-year smoking history. She has never used smokeless tobacco. Alcohol:  reports that she does not drink alcohol. Drug:  reports that she does not use illicit drugs.  Constitutional Exam  Vitals: Blood pressure 113/83, pulse 73, temperature 97.8 F (36.6 C), resp. rate 16, height 5\' 5"  (1.651 m), weight 190 lb (86.183 kg), SpO2 100 %. General appearance: Well nourished, well developed, and well hydrated. In no acute distress Calculated BMI/Body habitus: Body mass index is 31.62 kg/(m^2). (30-34.9 kg/m2)  Obese (Class I) - 68% higher incidence of chronic pain Psych/Mental status: Alert and oriented x 3 (person, place, & time) Eyes: PERLA Respiratory: No evidence of acute respiratory distress  Assessment & Plan  Primary Diagnosis & Pertinent Problem List: The primary encounter diagnosis was Chronic pain. A diagnosis of Opiate use (35 MME/Day) was also pertinent to this visit.  Visit Diagnosis: 1. Chronic pain   2. Opiate use (35 MME/Day)     Problems updated and reviewed during this  visit: No problems updated.  Problem-specific Plan(s): No problem-specific assessment & plan notes found for this encounter.  No new assessment & plan notes have been filed under this hospital service since the last note was generated. Service: Pain Management   Plan of Care  I have requested from the patient that we terminate our patient physician relationship as I do not feel that I can continue treating her. Her appointments with me have become progressively more disruptive to my practice and it is my impression that she is losing touch with reality. I truly believe that she needs to be seen by a psychiatrist for further evaluation. Today she told me that she did not appreciate me talking to her primary care physician, when in reality I have not done any of this. I feel I need to terminate this relationship before she starts accusing me of anything that she may proceed to be in proper. It is for this reason that for the longest time, I have not felt comfortable being alone in a room with this patient, despite the fact that she keeps closing the door behind me so that nobody can hear what she needs to say. Typically when this happens, I turn around and opened the door and get another nurse to come in as a chaperone. I truly shouldn't be feeling like this with any of my patients and therefore I no longer feel comfortable continuing to care for her.  Problem List Items Addressed This Visit      High   Chronic pain - Primary (Chronic)     Medium   Opiate use (35 MME/Day) (Chronic)       Pharmacotherapy (Medications Ordered): No orders of the defined types were placed in this encounter.    Lab-work & Procedure Ordered: No orders of the defined types were placed in this encounter.    Imaging Ordered: None  Interventional Therapies: Scheduled:  None at this time.    Considering:  None at this time.    PRN Procedures:  None at this time.    Referral(s) or Consult(s): None at this  time.  New Prescriptions   No medications on file    Medications administered during this visit: Ms. Papania had no medications administered during this visit.  Requested PM Follow-up: Return in about 8 weeks (around 08/06/2015) for Medication Management Evaluation & Possible Refill..  Future Appointments Date Time Provider [place]  08/06/2015 1:20 PM Milinda Pointer, MD [facility] None    Primary Care Physician: Eliezer Lofts, MD Location: [facility] Note by: Kathlen Brunswick. Dossie Arbour, M.D, DABA, DABAPM, DABPM, DABIPP, FIPP

## 2015-06-17 ENCOUNTER — Encounter: Payer: Self-pay | Admitting: Family Medicine

## 2015-06-17 ENCOUNTER — Ambulatory Visit (INDEPENDENT_AMBULATORY_CARE_PROVIDER_SITE_OTHER): Payer: Medicare Other | Admitting: Family Medicine

## 2015-06-17 ENCOUNTER — Telehealth: Payer: Self-pay | Admitting: Family Medicine

## 2015-06-17 VITALS — BP 116/72 | HR 81 | Temp 97.3°F | Ht 65.0 in | Wt 194.0 lb

## 2015-06-17 DIAGNOSIS — R300 Dysuria: Secondary | ICD-10-CM | POA: Diagnosis not present

## 2015-06-17 DIAGNOSIS — F411 Generalized anxiety disorder: Secondary | ICD-10-CM | POA: Diagnosis not present

## 2015-06-17 LAB — POC URINALSYSI DIPSTICK (AUTOMATED)
Bilirubin, UA: NEGATIVE
Glucose, UA: NEGATIVE
KETONES UA: NEGATIVE
Leukocytes, UA: NEGATIVE
Nitrite, UA: NEGATIVE
PH UA: 5.5
PROTEIN UA: NEGATIVE
RBC UA: NEGATIVE
UROBILINOGEN UA: NEGATIVE

## 2015-06-17 MED ORDER — BUSPIRONE HCL 10 MG PO TABS: 10.0000 mg | ORAL_TABLET | Freq: Two times a day (BID) | ORAL | Status: DC

## 2015-06-17 NOTE — Progress Notes (Signed)
Subjective:    Patient ID: Heather Haley, female    DOB: 1960-11-15, 55 y.o.   MRN: [id number]  Dysuria  This is a new problem. The current episode started in the past 7 days. The problem occurs every urination. The problem has been gradually worsening. The quality of the pain is described as burning. The pain is moderate. There has been no fever. She is not sexually active. Associated symptoms include flank pain, frequency and hematuria. Pertinent negatives include no chills, discharge, nausea, urgency or vomiting. Associated symptoms comments: Low abdominal pain   Yellow todark orange red urine  In last few day dribbling if push to get urine out she cannot get any pout.  left flank pain. She has tried increased fluids for the symptoms. The treatment provided moderate relief. Her past medical history is significant for kidney stones. There is no history of catheterization, recurrent UTIs, a single kidney, urinary stasis or a urological procedure.  Hematuria Irritative symptoms include frequency. Irritative symptoms do not include urgency. Associated symptoms include abdominal pain, dysuria and flank pain. Pertinent negatives include no chills, nausea or vomiting. Her past medical history is significant for kidney stones.  Abdominal Pain Associated symptoms include dysuria, frequency and hematuria. Pertinent negatives include no nausea or vomiting.  Back Pain Associated symptoms include abdominal pain and dysuria.  Flank Pain Associated symptoms include abdominal pain and dysuria.    Social History /Family History/Past Medical History reviewed and updated if needed. Hx of kidney stones 6 months ago.  Review of Systems  Constitutional: Negative for chills.  Gastrointestinal: Positive for abdominal pain. Negative for nausea and vomiting.  Genitourinary: Positive for dysuria, frequency, hematuria and flank pain. Negative for urgency.  Musculoskeletal: Positive for back pain.         Objective:   Physical Exam  Constitutional: Vital signs are normal. She appears well-developed and well-nourished. She is cooperative.  Non-toxic appearance. She does not appear ill. No distress.  overweight  HENT:  Head: Normocephalic.  Right Ear: Hearing, tympanic membrane, external ear and ear canal normal. Tympanic membrane is not erythematous, not retracted and not bulging.  Left Ear: Hearing, tympanic membrane, external ear and ear canal normal. Tympanic membrane is not erythematous, not retracted and not bulging.  Nose: No mucosal edema or rhinorrhea. Right sinus exhibits no maxillary sinus tenderness and no frontal sinus tenderness. Left sinus exhibits no maxillary sinus tenderness and no frontal sinus tenderness.  Mouth/Throat: Uvula is midline, oropharynx is clear and moist and mucous membranes are normal.  Eyes: Conjunctivae, EOM and lids are normal. Pupils are equal, round, and reactive to light. Lids are everted and swept, no foreign bodies found.  Neck: Trachea normal and normal range of motion. Neck supple. Carotid bruit is not present. No thyroid mass and no thyromegaly present.  Cardiovascular: Normal rate, regular rhythm, S1 normal, S2 normal, normal heart sounds, intact distal pulses and normal pulses.  Exam reveals no gallop and no friction rub.   No murmur heard. Pulmonary/Chest: Effort normal and breath sounds normal. No tachypnea. No respiratory distress. She has no decreased breath sounds. She has no wheezes. She has no rhonchi. She has no rales.  Abdominal: Soft. Normal appearance and bowel sounds are normal. There is no hepatosplenomegaly. There is tenderness in the suprapubic area and left lower quadrant. There is no rigidity, no guarding and no CVA tenderness.  Musculoskeletal:  Brace on left leg  Neurological: She is alert.  Skin: Skin is warm, dry and intact. No  rash noted.  Psychiatric: Her speech is normal and behavior is normal. Judgment and thought content  normal. Her mood appears not anxious. Cognition and memory are normal. She does not exhibit a depressed mood.          Assessment & Plan:

## 2015-06-17 NOTE — Assessment & Plan Note (Signed)
In pt with kidney stone history, last passed one 6 months ago per pt but per 05/2015 CT abd pelvis, no stones. No sign of UTI.  Push fluids.

## 2015-06-17 NOTE — Assessment & Plan Note (Signed)
Refilled buspar 10 mg BID , pt using prn with improvement in mood.

## 2015-06-17 NOTE — Telephone Encounter (Signed)
Paperwork placed in Dr. Rometta Emery in box for review.

## 2015-06-17 NOTE — Telephone Encounter (Signed)
Pt dropped off Living Will paper work for MD to view and comment on.

## 2015-06-17 NOTE — Progress Notes (Signed)
Pre visit review using our clinic review tool, if applicable. No additional management support is needed unless otherwise documented below in the visit note. 

## 2015-06-17 NOTE — Patient Instructions (Addendum)
Push fluids.  Water!  Avoid soda, tomato, citris, alcohol, caffeine, chocolate.  Call if not continuing to improve.

## 2015-06-20 NOTE — Telephone Encounter (Signed)
Noted will review and consider.

## 2015-06-23 ENCOUNTER — Telehealth: Payer: Self-pay | Admitting: Family Medicine

## 2015-06-23 NOTE — Telephone Encounter (Signed)
Pt called to check status of previous request

## 2015-06-23 NOTE — Telephone Encounter (Signed)
Pt requesting that you read over her living will, and that she has a good number for you to be reached at so that she can add your information to the document.   cb number for pt is [phone]

## 2015-06-24 NOTE — Telephone Encounter (Signed)
Ms. Lashlee notified as instructed by telephone.

## 2015-06-24 NOTE — Telephone Encounter (Signed)
Let pt know I am still considering my legal obligations in this instance and will let her know in the next 1-2 weeks.

## 2015-06-25 ENCOUNTER — Encounter: Payer: Self-pay | Admitting: Pain Medicine

## 2015-07-03 ENCOUNTER — Telehealth: Payer: Self-pay | Admitting: *Deleted

## 2015-07-03 ENCOUNTER — Encounter: Payer: Self-pay | Admitting: Pain Medicine

## 2015-07-03 NOTE — Telephone Encounter (Signed)
Pt called requesting for only Kori to give her a call. I made her aware that another nurse may call...td

## 2015-07-03 NOTE — Telephone Encounter (Signed)
Patient states there is no hard feelings if he stops being her physician.  She would like to make sure that her medical problems are seperated.  She would like a referral to a back specialist.

## 2015-07-08 ENCOUNTER — Telehealth: Payer: Self-pay | Admitting: Family Medicine

## 2015-07-08 NOTE — Telephone Encounter (Signed)
Pt dropped off medical records from Dr. Tomi Bamberger for pcp review. Packet is on the cart. Also, pt states sugar has been dropping very low and has been waking up at night. Last night she awoke at 4:30 am with stomach ache and sugar was 63. She drank a gatorade with 21g of sugar- it helped. She got up at 10:00am sugar was 70, stomach still hurt. She didn't take medication. Please advise pt if medication needs to be adjusted. This happens 1 or 2 times a week.

## 2015-07-08 NOTE — Telephone Encounter (Signed)
Have pt decrease metformin XR to 500 mg daily as A1C has been low recently as well. Send in rx if needed.   Also let pt know I have considered her request for me to be her HCPOA.. I do not feel comfortable with this. I would be happy to be her doctor, but if this is necessary she will have to find a different MD. As far as other physicians I have spoken with in out office, they would not either, so no need to transfer care in our office. Her paperwork to collect is in my in box at bottom.

## 2015-07-08 NOTE — Telephone Encounter (Signed)
Correction: on BID metformin short acting so decrease to 500 mg BID.

## 2015-07-09 ENCOUNTER — Encounter: Payer: Self-pay | Admitting: Family Medicine

## 2015-07-09 MED ORDER — METFORMIN HCL 500 MG PO TABS: 500.0000 mg | ORAL_TABLET | Freq: Two times a day (BID) | ORAL | Status: DC

## 2015-07-09 MED ORDER — OMEPRAZOLE 40 MG PO CPDR: 40.0000 mg | DELAYED_RELEASE_CAPSULE | Freq: Every day | ORAL | Status: DC | PRN

## 2015-07-09 NOTE — Telephone Encounter (Signed)
Ms. Heather Haley notified as instructed by telephone.  She states that part of her pain management agreement is to have a HCPOA.  She would like to keep Dr. Diona Browner as her physician and will probably ask her brother to be her 73.  Advised she can pick up the paperwork at her convenience.  New prescription for Metformin 500 mg sent into [facility].

## 2015-07-22 ENCOUNTER — Encounter: Payer: Self-pay | Admitting: Family Medicine

## 2015-07-24 ENCOUNTER — Encounter: Payer: Self-pay | Admitting: Family Medicine

## 2015-07-24 DIAGNOSIS — K5903 Drug induced constipation: Secondary | ICD-10-CM

## 2015-07-24 DIAGNOSIS — T402X5A Adverse effect of other opioids, initial encounter: Principal | ICD-10-CM

## 2015-07-24 MED ORDER — TOPIRAMATE 100 MG PO TABS
200.0000 mg | ORAL_TABLET | Freq: Two times a day (BID) | ORAL | Status: DC
Start: 2015-07-24 — End: 2015-07-26

## 2015-07-26 MED ORDER — BISACODYL 5 MG PO TBEC: 10.0000 mg | DELAYED_RELEASE_TABLET | Freq: Every day | ORAL | Status: DC | PRN

## 2015-07-26 MED ORDER — TOPIRAMATE 100 MG PO TABS: 200.0000 mg | ORAL_TABLET | Freq: Two times a day (BID) | ORAL | Status: DC

## 2015-07-26 MED ORDER — VERAPAMIL HCL ER 120 MG PO TBCR: 120.0000 mg | EXTENDED_RELEASE_TABLET | Freq: Every day | ORAL | Status: DC

## 2015-08-06 ENCOUNTER — Ambulatory Visit: Payer: Medicare Other | Attending: Pain Medicine | Admitting: Pain Medicine

## 2015-08-06 ENCOUNTER — Encounter: Payer: Self-pay | Admitting: Pain Medicine

## 2015-08-06 VITALS — BP 127/89 | HR 83 | Temp 98.2°F | Resp 16 | Ht 60.0 in | Wt 177.0 lb

## 2015-08-06 DIAGNOSIS — Z87891 Personal history of nicotine dependence: Secondary | ICD-10-CM | POA: Insufficient documentation

## 2015-08-06 DIAGNOSIS — M1611 Unilateral primary osteoarthritis, right hip: Secondary | ICD-10-CM | POA: Insufficient documentation

## 2015-08-06 DIAGNOSIS — Z79899 Other long term (current) drug therapy: Secondary | ICD-10-CM

## 2015-08-06 DIAGNOSIS — M25572 Pain in left ankle and joints of left foot: Secondary | ICD-10-CM | POA: Diagnosis not present

## 2015-08-06 DIAGNOSIS — E1140 Type 2 diabetes mellitus with diabetic neuropathy, unspecified: Secondary | ICD-10-CM | POA: Diagnosis not present

## 2015-08-06 DIAGNOSIS — F410 Panic disorder [episodic paroxysmal anxiety] without agoraphobia: Secondary | ICD-10-CM | POA: Insufficient documentation

## 2015-08-06 DIAGNOSIS — R109 Unspecified abdominal pain: Secondary | ICD-10-CM | POA: Diagnosis not present

## 2015-08-06 DIAGNOSIS — Z96652 Presence of left artificial knee joint: Secondary | ICD-10-CM | POA: Insufficient documentation

## 2015-08-06 DIAGNOSIS — M25571 Pain in right ankle and joints of right foot: Secondary | ICD-10-CM | POA: Insufficient documentation

## 2015-08-06 DIAGNOSIS — T402X5A Adverse effect of other opioids, initial encounter: Secondary | ICD-10-CM

## 2015-08-06 DIAGNOSIS — G43909 Migraine, unspecified, not intractable, without status migrainosus: Secondary | ICD-10-CM | POA: Diagnosis not present

## 2015-08-06 DIAGNOSIS — Z79891 Long term (current) use of opiate analgesic: Secondary | ICD-10-CM | POA: Diagnosis not present

## 2015-08-06 DIAGNOSIS — K5903 Drug induced constipation: Secondary | ICD-10-CM | POA: Insufficient documentation

## 2015-08-06 DIAGNOSIS — M545 Low back pain, unspecified: Secondary | ICD-10-CM

## 2015-08-06 DIAGNOSIS — R1031 Right lower quadrant pain: Secondary | ICD-10-CM | POA: Diagnosis not present

## 2015-08-06 DIAGNOSIS — G8929 Other chronic pain: Secondary | ICD-10-CM | POA: Insufficient documentation

## 2015-08-06 DIAGNOSIS — E7800 Pure hypercholesterolemia, unspecified: Secondary | ICD-10-CM | POA: Diagnosis not present

## 2015-08-06 DIAGNOSIS — K589 Irritable bowel syndrome without diarrhea: Secondary | ICD-10-CM | POA: Diagnosis not present

## 2015-08-06 DIAGNOSIS — I10 Essential (primary) hypertension: Secondary | ICD-10-CM | POA: Insufficient documentation

## 2015-08-06 DIAGNOSIS — F329 Major depressive disorder, single episode, unspecified: Secondary | ICD-10-CM | POA: Diagnosis not present

## 2015-08-06 DIAGNOSIS — M533 Sacrococcygeal disorders, not elsewhere classified: Secondary | ICD-10-CM | POA: Diagnosis not present

## 2015-08-06 DIAGNOSIS — K219 Gastro-esophageal reflux disease without esophagitis: Secondary | ICD-10-CM | POA: Diagnosis not present

## 2015-08-06 DIAGNOSIS — M160 Bilateral primary osteoarthritis of hip: Secondary | ICD-10-CM | POA: Diagnosis not present

## 2015-08-06 DIAGNOSIS — Z6834 Body mass index (BMI) 34.0-34.9, adult: Secondary | ICD-10-CM | POA: Diagnosis not present

## 2015-08-06 DIAGNOSIS — M25569 Pain in unspecified knee: Secondary | ICD-10-CM | POA: Diagnosis present

## 2015-08-06 DIAGNOSIS — M25579 Pain in unspecified ankle and joints of unspecified foot: Secondary | ICD-10-CM | POA: Diagnosis present

## 2015-08-06 MED ORDER — HYDROCODONE-ACETAMINOPHEN 5-325 MG PO TABS: 1.0000 | ORAL_TABLET | Freq: Every day | ORAL | Status: DC | PRN

## 2015-08-06 MED ORDER — TRAMADOL HCL 50 MG PO TABS: 100.0000 mg | ORAL_TABLET | Freq: Three times a day (TID) | ORAL | Status: DC | PRN

## 2015-08-06 MED ORDER — BISACODYL 5 MG PO TBEC: 10.0000 mg | DELAYED_RELEASE_TABLET | Freq: Every day | ORAL | Status: DC | PRN

## 2015-08-06 MED ORDER — POLYETHYLENE GLYCOL 3350 17 GM/SCOOP PO POWD: 1.0000 | Freq: Once | ORAL | Status: DC

## 2015-08-06 NOTE — Progress Notes (Signed)
Patient's Name: Heather Haley  Patient type: Established  MRN: [id number]  Service setting: Ambulatory outpatient  DOB: [birth year]  Location: [facility]  DOS: 08/06/2015  Primary Care Physician: Eliezer Lofts, MD  Note by: Kathlen Brunswick. Dossie Arbour, M.D, DABA, DABAPM, DABPM, Milagros Evener, FIPP  Referring Physician: Ronnell Guadalajara*  Specialty: Board-Certified Interventional Pain Management  Last Visit to Pain Management: 07/03/2015   Primary Reason(s) for Visit: Encounter for prescription drug management (Level of risk: moderate) CC: Knee Pain and Ankle Pain   HPI  Heather Haley is a 55 y.o. year old, female patient, who returns today as an established patient. She has ADVERSE DRUG REACTION; Hypertension; IBS (irritable bowel syndrome); Depression; Morbid obesity ([place]); Leg length inequality; Encounter for therapeutic drug level monitoring; Encounter for long-term opiate analgesic use; Long-term current use of opiate analgesic; Uncomplicated opioid dependence ([place]); Opiate use (35 MME/Day); Chronic pain syndrome; Chronic constipation; Personal history of other diseases of the digestive system; Diabetic sensory peripheral neuropathy (Bilateral Lower Extremity); Generalized anxiety disorder; History of panic attacks; History of tobacco abuse; GERD (gastroesophageal reflux disease); Non-insulin dependent type 2 diabetes mellitus ([place]); Coccygeal pain; History of migraine; Chronic ankle pain (secondary to a work-related injury) (Date of injury 04/17/2006) (Left); Chronic low back pain (Location of Primary Pain) (Bilateral) (R>L); Lumbar spondylosis (L3-4 & L4-5); Lumbar facet syndrome (Location of Primary Source of Pain) (Bilateral) (R>L); Chronic pain; Chronic hip pain (Right); Long term prescription opiate use; Chronic knee pain (S/P TKR:Total Knee Replacement) (Left); Osteoarthritis of hips (Location of Secondary source of pain) (Bilateral) (Right); History of total knee  replacement (Left); Chronic groin pain (Location of Secondary source of pain) (Right); Chronic lower extremity pain (Location of Tertiary source of pain) (Bilateral) (R>L); Osteoarthritis of hip (Right); Opioid-induced constipation (OIC); Pure hypercholesterolemia; and Dysuria on her problem list.. Her primarily concern today is the Knee Pain and Ankle Pain   Pain Assessment: Self-Reported Pain Score: 3  Reported level is compatible with observation       Pain Type: Chronic pain Pain Location: Knee (ankle) Pain Orientation: Left Pain Descriptors / Indicators: Aching, Sore (stiffness) Pain Frequency: Constant  The patient comes into the clinics today for pharmacological management of her chronic pain. I last saw this patient on 06/10/2015. The patient  reports that she does not use illicit drugs. Her body mass index is 34.57 kg/(m^2).   Today the patient was informed that I will no longer continue to be her pain physician. I believe that her: Dependence on me is not healthy. My opinion is based on the latest that I have received from the patient. This information was conveyed to the patient today. She was informed that I will be available for emergencies only for the next 30 days. He understood and accepted.  Date of Last Visit: 05/19/15 Service Provided on Last Visit: Med Refill  Controlled Substance Pharmacotherapy Assessment & REMS (Risk Evaluation and Mitigation Strategy)  Analgesic: Hydrocodone/APAP 5/325 one daily (5 mg/day of hydrocodone) + tramadol 100 mg every 8 hours (300 mg/day) MME/day: 35 mg/day Pill Count: Hydrocodone pill count # 14/30 Filled 07-17-15. Tramadol pill count # 38/180 Filled 07-10-15. Pharmacokinetics: Onset of action (Liberation/Absorption): Within expected pharmacological parameters Time to Peak effect (Distribution): Timing and results are as within normal expected parameters Duration of action (Metabolism/Excretion): Within normal limits for  medication Pharmacodynamics: Analgesic Effect: More than 50% Activity Facilitation: Medication(s) allow patient to sit, stand, walk, and do the basic ADLs Perceived Effectiveness: Described as relatively effective, allowing  for increase in activities of daily living (ADL) Side-effects or Adverse reactions: None reported Monitoring: [place] PMP: Online review of the past 37-month period conducted. Compliant with practice rules and regulations Last UDS on record: TOXASSURE SELECT 13  Date Value Ref Range Status  02/19/2015 FINAL  Final    Comment:    ==================================================================== TOXASSURE SELECT 13 (MW) ==================================================================== Test                             Result       Flag       Units Drug Present and Declared for Prescription Verification   Hydrocodone                    122          EXPECTED   ng/mg creat   Dihydrocodeine                 24           EXPECTED   ng/mg creat   Norhydrocodone                 340          EXPECTED   ng/mg creat    Sources of hydrocodone include scheduled prescription    medications. Dihydrocodeine and norhydrocodone are expected    metabolites of hydrocodone. Dihydrocodeine is also available as a    scheduled prescription medication.   Tramadol                       PRESENT      EXPECTED   O-Desmethyltramadol            PRESENT      EXPECTED   N-Desmethyltramadol            PRESENT      EXPECTED    Source of tramadol is a prescription medication.    O-desmethyltramadol and N-desmethyltramadol are expected    metabolites of tramadol. ==================================================================== Test                      Result    Flag   Units      Ref Range   Creatinine              259              mg/dL      >=20 ==================================================================== Declared Medications:  The flagging and interpretation on this report are based  on the  following declared medications.  Unexpected results may arise from  inaccuracies in the declared medications.  **Note: The testing scope of this panel includes these medications:  Hydrocodone  Tramadol ==================================================================== For clinical consultation, please call [phone]. ====================================================================    UDS interpretation: Compliant          Medication Assessment Form: Reviewed. Patient indicates being compliant with therapy Treatment compliance: Compliant Risk Assessment: Aberrant Behavior: None observed today Substance Use Disorder (SUD) Risk Level: Low Risk of opioid abuse or dependence: 0.7-3.0% with doses ? 36 MME/day and 6.1-26% with doses ? 120 MME/day. Opioid Risk Tool (ORT) Score: Total Score: 4 Moderate Risk for SUD (Score between 4-7) Depression Scale Score: PHQ-2: PHQ-2 Total Score: 0 No depression (0) PHQ-9: PHQ-9 Total Score: 0 No depression (0-4)  Pharmacologic Plan: No change in therapy, at this time  Laboratory Chemistry  Inflammation Markers Lab Results  Component Value  Date   ESRSEDRATE 8 09/14/2013   CRP 0.9* 05/24/2013    Renal Function Lab Results  Component Value Date   BUN 18 05/16/2015   CREATININE 0.96 05/16/2015   GFRAA >60 05/16/2015   GFRNONAA >60 05/16/2015    Hepatic Function Lab Results  Component Value Date   AST 17 10/15/2014   ALT 17 10/15/2014   ALBUMIN 3.7 10/15/2014    Electrolytes Lab Results  Component Value Date   NA 142 05/16/2015   K 4.0 05/16/2015   CL 109 05/16/2015   CALCIUM 10.2 05/16/2015   MG 2.0 03/12/2014    Pain Modulating Vitamins No results found for: Maralyn Sago [id number], [id number], [id number], 25OHVITD1, 25OHVITD2, 25OHVITD3, VITAMINB12  Coagulation Parameters Lab Results  Component Value Date   INR 1.06 05/17/2015   LABPROT 13.6 05/17/2015   APTT 25 05/17/2015   PLT 245 05/16/2015    Note:  Labs Reviewed.  Recent Diagnostic Imaging  Dg Chest 2 View  05/16/2015  ADDENDUM REPORT: 05/16/2015 23:46 ADDENDUM: This addendum is created to include the abdominal radiograph that was performed concurrently with the two-view chest radiograph. CLINICAL DATA:  Constipation for 3 days. EXAM: Abdomen, one-view. COMPARISON:  CT 10/16/2014 FINDINGS: No dilated bowel loops. Moderate stool burden in the ascending and descending colon. No evidence of free air. No radiopaque calculi. No osseous abnormality. IMPRESSION: Moderate stool burden.  No bowel obstruction. Electronically Signed   By: Jeb Levering M.D.   On: 05/16/2015 23:46  05/16/2015  CLINICAL DATA:  Left-sided chest pain. EXAM: ABDOMEN - 1 VIEW; CHEST - 2 VIEW COMPARISON:  Radiographs and CT 10/09/2014 FINDINGS: The cardiomediastinal contours are normal. The lungs are clear. Pulmonary vasculature is normal. No consolidation, pleural effusion, or pneumothorax. No acute osseous abnormalities are seen. IMPRESSION: No acute pulmonary process. Electronically Signed: By: Jeb Levering M.D. On: 05/16/2015 23:40   Dg Abd 1 View  05/16/2015  ADDENDUM REPORT: 05/16/2015 23:46 ADDENDUM: This addendum is created to include the abdominal radiograph that was performed concurrently with the two-view chest radiograph. CLINICAL DATA:  Constipation for 3 days. EXAM: Abdomen, one-view. COMPARISON:  CT 10/16/2014 FINDINGS: No dilated bowel loops. Moderate stool burden in the ascending and descending colon. No evidence of free air. No radiopaque calculi. No osseous abnormality. IMPRESSION: Moderate stool burden.  No bowel obstruction. Electronically Signed   By: Jeb Levering M.D.   On: 05/16/2015 23:46  05/16/2015  CLINICAL DATA:  Left-sided chest pain. EXAM: ABDOMEN - 1 VIEW; CHEST - 2 VIEW COMPARISON:  Radiographs and CT 10/09/2014 FINDINGS: The cardiomediastinal contours are normal. The lungs are clear. Pulmonary vasculature is normal. No consolidation, pleural  effusion, or pneumothorax. No acute osseous abnormalities are seen. IMPRESSION: No acute pulmonary process. Electronically Signed: By: Jeb Levering M.D. On: 05/16/2015 23:40   Ct Angio Chest Pe W/cm &/or Wo Cm  05/17/2015  CLINICAL DATA:  Chest pain.  Constipation. EXAM: CT ANGIOGRAPHY CHEST CT ABDOMEN AND PELVIS WITH CONTRAST TECHNIQUE: Multidetector CT imaging of the chest was performed using the standard protocol during bolus administration of intravenous contrast. Multiplanar CT image reconstructions and MIPs were obtained to evaluate the vascular anatomy. Multidetector CT imaging of the abdomen and pelvis was performed using the standard protocol during bolus administration of intravenous contrast. CONTRAST:  169mL OMNIPAQUE IOHEXOL 350 MG/ML SOLN COMPARISON:  10/16/2014 FINDINGS: CTA CHEST FINDINGS Cardiovascular: There is good opacification of the pulmonary arteries. There is no pulmonary embolism. The thoracic aorta is normal in caliber and intact. Lungs: Clear Central  airways: Patent Effusions: None Lymphadenopathy: None Esophagus: Unremarkable Musculoskeletal: No significant abnormality CT ABDOMEN and PELVIS FINDINGS Hepatobiliary: There are normal appearances of the liver, gallbladder and bile ducts. Pancreas: Normal Spleen: Normal Adrenals/Urinary Tract: The adrenals and kidneys are normal in appearance. There is no urinary calculus evident. There is no hydronephrosis or ureteral dilatation. Collecting systems and ureters appear unremarkable. Stomach/Bowel: The stomach and small bowel are normal in appearance. Prior appendectomy. The colon is distended with a large volume of stool but is otherwise unremarkable. Vascular/Lymphatic: The abdominal aorta is normal in caliber. There is mild atherosclerotic calcification. There is no adenopathy in the abdomen or pelvis. Reproductive: Hysterectomy.  No adnexal abnormalities. Other: No acute inflammatory changes are evident in the abdomen or pelvis. There  is no ascites. Musculoskeletal: No significant abnormality Review of the MIP images confirms the above findings. IMPRESSION: No acute findings are evident in the chest, abdomen or pelvis. A large volume of stool distends the colon, consistent with constipation. Electronically Signed   By: Andreas Newport M.D.   On: 05/17/2015 04:17   Ct Abdomen Pelvis W Contrast  05/17/2015  CLINICAL DATA:  Chest pain.  Constipation. EXAM: CT ANGIOGRAPHY CHEST CT ABDOMEN AND PELVIS WITH CONTRAST TECHNIQUE: Multidetector CT imaging of the chest was performed using the standard protocol during bolus administration of intravenous contrast. Multiplanar CT image reconstructions and MIPs were obtained to evaluate the vascular anatomy. Multidetector CT imaging of the abdomen and pelvis was performed using the standard protocol during bolus administration of intravenous contrast. CONTRAST:  150mL OMNIPAQUE IOHEXOL 350 MG/ML SOLN COMPARISON:  10/16/2014 FINDINGS: CTA CHEST FINDINGS Cardiovascular: There is good opacification of the pulmonary arteries. There is no pulmonary embolism. The thoracic aorta is normal in caliber and intact. Lungs: Clear Central airways: Patent Effusions: None Lymphadenopathy: None Esophagus: Unremarkable Musculoskeletal: No significant abnormality CT ABDOMEN and PELVIS FINDINGS Hepatobiliary: There are normal appearances of the liver, gallbladder and bile ducts. Pancreas: Normal Spleen: Normal Adrenals/Urinary Tract: The adrenals and kidneys are normal in appearance. There is no urinary calculus evident. There is no hydronephrosis or ureteral dilatation. Collecting systems and ureters appear unremarkable. Stomach/Bowel: The stomach and small bowel are normal in appearance. Prior appendectomy. The colon is distended with a large volume of stool but is otherwise unremarkable. Vascular/Lymphatic: The abdominal aorta is normal in caliber. There is mild atherosclerotic calcification. There is no adenopathy in the  abdomen or pelvis. Reproductive: Hysterectomy.  No adnexal abnormalities. Other: No acute inflammatory changes are evident in the abdomen or pelvis. There is no ascites. Musculoskeletal: No significant abnormality Review of the MIP images confirms the above findings. IMPRESSION: No acute findings are evident in the chest, abdomen or pelvis. A large volume of stool distends the colon, consistent with constipation. Electronically Signed   By: Andreas Newport M.D.   On: 05/17/2015 04:17    Meds  The patient has a current medication list which includes the following prescription(s): aspirin ec, bisacodyl, bupropion, buspirone, cetirizine, vitamin d, clindamycin, docusate sodium, fluticasone, hydrocodone-acetaminophen, hydrocodone-acetaminophen, hydrocodone-acetaminophen, hypromellose, lactulose, metformin, omeprazole, polyethylene glycol powder, pravastatin, ranitidine, sumatriptan, topiramate, tramadol, trazodone, and verapamil.  Current Outpatient Prescriptions on File Prior to Visit  Medication Sig  . aspirin EC 81 MG tablet Take 81 mg by mouth daily.  Marland Kitchen buPROPion (WELLBUTRIN XL) 300 MG 24 hr tablet Take 1 tablet (300 mg total) by mouth daily.  . busPIRone (BUSPAR) 10 MG tablet Take 1 tablet (10 mg total) by mouth 2 (two) times daily.  . cetirizine (ZYRTEC)  10 MG tablet Take 1 tablet (10 mg total) by mouth at bedtime. (Patient taking differently: Take 10 mg by mouth daily as needed for allergies. )  . Cholecalciferol (VITAMIN D) 2000 UNITS CAPS Take 2,000 Units by mouth daily.  . clindamycin (CLEOCIN) 300 MG capsule TK 2 CS PO 1 HOUR PRIOR TO DENTAL WORK  . docusate sodium (COLACE) 100 MG capsule Take 100 mg by mouth daily.  . fluticasone (FLONASE) 50 MCG/ACT nasal spray Place 2 sprays into both nostrils daily as needed for allergies.   . Hypromellose (ARTIFICIAL TEARS OP) Apply 2 drops to eye daily as needed (for dry eyes). Dry eye  . lactulose (CHRONULAC) 10 GM/15ML solution Take 10 g by mouth 2  (two) times daily as needed for mild constipation.  . metFORMIN (GLUCOPHAGE) 500 MG tablet Take 1 tablet (500 mg total) by mouth 2 (two) times daily with a meal.  . omeprazole (PRILOSEC) 40 MG capsule Take 1 capsule (40 mg total) by mouth daily as needed (indigestion).  . pravastatin (PRAVACHOL) 40 MG tablet Take 40 mg by mouth at bedtime.  . ranitidine (ZANTAC) 150 MG capsule Take 150 mg by mouth as needed for heartburn.  . SUMAtriptan (IMITREX) 100 MG tablet Take 1 tab at onset of headache, may take second dose after 1 hour. Do not take more than 2 a week  . topiramate (TOPAMAX) 100 MG tablet Take 2 tablets (200 mg total) by mouth 2 (two) times daily.  . traZODone (DESYREL) 50 MG tablet Take 50 mg by mouth at bedtime.  . verapamil (CALAN-SR) 120 MG CR tablet Take 1 tablet (120 mg total) by mouth at bedtime.   No current facility-administered medications on file prior to visit.    ROS  Constitutional: Denies any fever or chills Gastrointestinal: No reported hemesis, hematochezia, vomiting, or acute GI distress Musculoskeletal: Denies any acute onset joint swelling, redness, loss of ROM, or weakness Neurological: No reported episodes of acute onset apraxia, aphasia, dysarthria, agnosia, amnesia, paralysis, loss of coordination, or loss of consciousness  Allergies  Ms. Lago is allergic to buprenorphine hcl; codeine; cymbalta; duloxetine; gabapentin; lyrica; morphine and related; nortripytline hcl; nsaids; penicillin g; penicillins; and tolmetin.  [place]  Medical:  Ms. Bex  has a past medical history of Hypertension; IBS (irritable bowel syndrome); Postoperative nausea and vomiting; Diabetes mellitus ([place]); Left ankle instability; Headache(784.0); Left knee DJD; Depression; Frequency of urination; GERD (gastroesophageal reflux disease); High cholesterol; Neuromuscular disorder ([place]); Anxiety; Lumbar Degenerative Disc Disease of  (10/11/2014); Other enthesopathy of ankle and tarsus  (12/15/2009); Osteoarthritis of hip (Right) (05/05/2015); and Peripheral sensory neuropathy (Bilateral) (11/19/2014). Family: family history includes Cancer in her mother; Heart disease in her father; Hypertension in her father. Surgical:  has past surgical history that includes Ankle surgery; Abdominal hysterectomy; Knee arthroscopy (1997); Total knee arthroplasty (08/30/2011); Colonoscopy (2013); Appendectomy; left heart catheterization with coronary angiogram (N/A, 01/11/2013); and Esophagogastroduodenoscopy (egd) with propofol (N/A, 10/03/2014). Tobacco:  reports that she quit smoking about 20 years ago. Her smoking use included Cigarettes. She has a 30 pack-year smoking history. She has never used smokeless tobacco. Alcohol:  reports that she does not drink alcohol. Drug:  reports that she does not use illicit drugs.  Constitutional Exam  Vitals: Blood pressure 127/89, pulse 83, temperature 98.2 F (36.8 C), resp. rate 16, height 5' (1.524 m), weight 177 lb (80.287 kg), SpO2 98 %. General appearance: Well nourished, well developed, and well hydrated. In no acute distress Calculated BMI/Body habitus: Body mass index is  34.57 kg/(m^2). (30-34.9 kg/m2) Obese (Class I) - 68% higher incidence of chronic pain Psych/Mental status: Alert and oriented x 3 (person, place, & time) Eyes: PERLA Respiratory: No evidence of acute respiratory distress  Cervical Spine Exam  Inspection: No masses, redness, or swelling Alignment: Symmetrical ROM: Functional: ROM is within functional limits [facility]) Stability: No instability detected Muscle strength & Tone: Functionally intact Sensory: Unimpaired Palpation: No complaints of tenderness  Upper Extremity (UE) Exam    Side: Right upper extremity  Side: Left upper extremity  Inspection: No masses, redness, swelling, or asymmetry  Inspection: No masses, redness, swelling, or asymmetry  ROM:  ROM:  Functional: ROM is within functional limits [facility])  Functional: ROM  is within functional limits [facility])  Muscle strength & Tone: Functionally intact  Muscle strength & Tone: Functionally intact  Sensory: Unimpaired  Sensory: Unimpaired  Palpation: Non-contributory  Palpation: Non-contributory   Thoracic Spine Exam  Inspection: No masses, redness, or swelling Alignment: Symmetrical ROM: Functional: ROM is within functional limits [facility]) Stability: No instability detected Sensory: Unimpaired Muscle strength & Tone: Functionally intact Palpation: No complaints of tenderness  Lumbar Spine Exam  Inspection: No masses, redness, or swelling Alignment: Symmetrical ROM: Functional: ROM is within functional limits [facility]) Stability: No instability detected Muscle strength & Tone: Functionally intact Sensory: Unimpaired Palpation: No complaints of tenderness Provocative Tests: Lumbar Hyperextension and rotation test: deferred Patrick's Maneuver: deferred  Gait & Posture Assessment  Ambulation: Patient ambulates using a cane Gait: Antalgic Posture: WNL  Lower Extremity Exam    Side: Right lower extremity  Side: Left lower extremity  Inspection: No masses, redness, swelling, or asymmetry ROM:  Inspection: No masses, redness, swelling, or asymmetry ROM:  Functional: ROM is within functional limits [facility])  Functional: ROM is within functional limits [facility])  Muscle strength & Tone: Functionally intact  Muscle strength & Tone: Functionally intact  Sensory: Unimpaired  Sensory: Unimpaired  Palpation: Non-contributory  Palpation: Non-contributory   Assessment & Plan  Primary Diagnosis & Pertinent Problem List: The primary encounter diagnosis was Chronic pain. Diagnoses of Long term prescription opiate use, Chronic low back pain (Location of Primary Pain) (Bilateral) (R>L), Chronic groin pain (Location of Secondary source of pain) (Right), and Opioid-induced constipation (OIC) were also pertinent to this visit.  Visit Diagnosis: 1. Chronic pain   2. Long term  prescription opiate use   3. Chronic low back pain (Location of Primary Pain) (Bilateral) (R>L)   4. Chronic groin pain (Location of Secondary source of pain) (Right)   5. Opioid-induced constipation (OIC)     Problems updated and reviewed during this visit: No problems updated.  Problem-specific Plan(s): No problem-specific assessment & plan notes found for this encounter.  No new assessment & plan notes have been filed under this hospital service since the last note was generated. Service: Pain Management   Plan of Care   Problem List Items Addressed This Visit      High   Chronic groin pain (Location of Secondary source of pain) (Right) (Chronic)   Relevant Medications   traMADol (ULTRAM) 50 MG tablet   HYDROcodone-acetaminophen (NORCO/VICODIN) 5-325 MG tablet   HYDROcodone-acetaminophen (NORCO/VICODIN) 5-325 MG tablet   HYDROcodone-acetaminophen (NORCO/VICODIN) 5-325 MG tablet   Chronic low back pain (Location of Primary Pain) (Bilateral) (R>L) (Chronic)   Relevant Medications   traMADol (ULTRAM) 50 MG tablet   HYDROcodone-acetaminophen (NORCO/VICODIN) 5-325 MG tablet   HYDROcodone-acetaminophen (NORCO/VICODIN) 5-325 MG tablet   HYDROcodone-acetaminophen (NORCO/VICODIN) 5-325 MG tablet   Chronic pain - Primary (  Chronic)   Relevant Medications   traMADol (ULTRAM) 50 MG tablet   HYDROcodone-acetaminophen (NORCO/VICODIN) 5-325 MG tablet   HYDROcodone-acetaminophen (NORCO/VICODIN) 5-325 MG tablet   HYDROcodone-acetaminophen (NORCO/VICODIN) 5-325 MG tablet   Other Relevant Orders   Ambulatory referral to Pain Clinic     Medium   Long term prescription opiate use (Chronic)   Opioid-induced constipation (OIC) (Chronic)   Relevant Medications   bisacodyl (DULCOLAX) 5 MG EC tablet   polyethylene glycol powder (GLYCOLAX/MIRALAX) powder       Pharmacotherapy (Medications Ordered): Meds ordered this encounter  Medications  . bisacodyl (DULCOLAX) 5 MG EC tablet    Sig:  Take 2 tablets (10 mg total) by mouth daily as needed for moderate constipation.    Dispense:  180 tablet    Refill:  2    Do not add this medication to the electronic "Automatic Refill" notification system. Patient may have prescription filled one day early if pharmacy is closed on scheduled refill date.  . polyethylene glycol powder (GLYCOLAX/MIRALAX) powder    Sig: Take 255 g by mouth once.    Dispense:  255 g    Refill:  2    Do not add this medication to the electronic "Automatic Refill" notification system. Patient may have prescription filled one day early if pharmacy is closed on scheduled refill date.  . traMADol (ULTRAM) 50 MG tablet    Sig: Take 2 tablets (100 mg total) by mouth every 8 (eight) hours as needed for moderate pain or severe pain.    Dispense:  180 tablet    Refill:  2    Do not place this medication, or any other prescription from our practice, on "Automatic Refill". Patient may have prescription filled one day early if pharmacy is closed on scheduled refill date. Do not fill until: 08/17/15 To last until: 11/15/15  . HYDROcodone-acetaminophen (NORCO/VICODIN) 5-325 MG tablet    Sig: Take 1 tablet by mouth daily as needed for severe pain.    Dispense:  30 tablet    Refill:  0    Do not place this medication, or any other prescription from our practice, on "Automatic Refill". Patient may have prescription filled one day early if pharmacy is closed on scheduled refill date. Do not fill until: 08/17/15 To last until: 09/16/15  . HYDROcodone-acetaminophen (NORCO/VICODIN) 5-325 MG tablet    Sig: Take 1 tablet by mouth daily as needed for severe pain.    Dispense:  30 tablet    Refill:  0    Do not place this medication, or any other prescription from our practice, on "Automatic Refill". Patient may have prescription filled one day early if pharmacy is closed on scheduled refill date. Do not fill until: 09/16/15 To last until: 10/16/15  . HYDROcodone-acetaminophen  (NORCO/VICODIN) 5-325 MG tablet    Sig: Take 1 tablet by mouth daily as needed for severe pain.    Dispense:  30 tablet    Refill:  0    Do not place this medication, or any other prescription from our practice, on "Automatic Refill". Patient may have prescription filled one day early if pharmacy is closed on scheduled refill date. Do not fill until: 10/16/15 To last until: 11/15/15    [facility] & Procedure Ordered: Orders Placed This Encounter  Procedures  . Ambulatory referral to Pain Clinic    Imaging Ordered: AMB REFERRAL TO PAIN CLINIC  Interventional Therapies: Scheduled:  None    Considering:  None    PRN Procedures:  None    Referral(s) or Consult(s): None at this time.  New Prescriptions   No medications on file    Medications administered during this visit: Ms. Nilsson had no medications administered during this visit.  Requested PM Follow-up: Return No return appointment., for No return appointment. D/C from service..  No future appointments.  Primary Care Physician: Eliezer Lofts, MD Location: [facility] Facility Note by: Kathlen Brunswick. Dossie Arbour, M.D, DABA, DABAPM, DABPM, DABIPP, FIPP  Pain Score Disclaimer: We use the NRS-11 scale. This is a self-reported, subjective measurement of pain severity with only modest accuracy. It is used primarily to identify changes within a particular patient. It must be understood that outpatient pain scales are significantly less accurate that those used for research, where they can be applied under ideal controlled circumstances with minimal exposure to variables. In reality, the score is likely to be a combination of pain intensity and pain affect, where pain affect describes the degree of emotional arousal or changes in action readiness caused by the sensory experience of pain. Factors such as social and work situation, setting, emotional state, anxiety levels, expectation, and prior pain experience may  influence pain perception and show large inter-individual differences that may also be affected by time variables.  Patient instructions provided during this appointment: There are no Patient Instructions on file for this visit.

## 2015-08-06 NOTE — Progress Notes (Signed)
Safety precautions to be maintained throughout the outpatient stay will include: orient to surroundings, keep bed in low position, maintain call bell within reach at all times, provide assistance with transfer out of bed and ambulation. Hydrocodone pill count # 14/30  Filled 07-17-15 Tramadol pill count # 38/180  Filled 07-10-15

## 2015-08-08 ENCOUNTER — Encounter: Payer: Self-pay | Admitting: Family Medicine

## 2015-08-11 ENCOUNTER — Encounter: Payer: Self-pay | Admitting: Pain Medicine

## 2015-08-14 ENCOUNTER — Other Ambulatory Visit: Payer: Self-pay | Admitting: *Deleted

## 2015-08-14 DIAGNOSIS — G43901 Migraine, unspecified, not intractable, with status migrainosus: Secondary | ICD-10-CM

## 2015-08-14 MED ORDER — SUMATRIPTAN SUCCINATE 100 MG PO TABS: ORAL_TABLET | ORAL | Status: DC

## 2015-08-18 ENCOUNTER — Encounter: Payer: Self-pay | Admitting: Pain Medicine

## 2015-08-22 ENCOUNTER — Ambulatory Visit (INDEPENDENT_AMBULATORY_CARE_PROVIDER_SITE_OTHER): Payer: Medicare Other | Admitting: Family Medicine

## 2015-08-22 ENCOUNTER — Encounter: Payer: Self-pay | Admitting: Family Medicine

## 2015-08-22 VITALS — BP 128/68 | HR 70 | Temp 97.8°F | Wt 186.8 lb

## 2015-08-22 DIAGNOSIS — F331 Major depressive disorder, recurrent, moderate: Secondary | ICD-10-CM | POA: Diagnosis not present

## 2015-08-22 DIAGNOSIS — I10 Essential (primary) hypertension: Secondary | ICD-10-CM

## 2015-08-22 DIAGNOSIS — F411 Generalized anxiety disorder: Secondary | ICD-10-CM

## 2015-08-22 DIAGNOSIS — Z8659 Personal history of other mental and behavioral disorders: Secondary | ICD-10-CM | POA: Diagnosis not present

## 2015-08-22 MED ORDER — ALPRAZOLAM 0.25 MG PO TABS: 0.2500 mg | ORAL_TABLET | Freq: Every day | ORAL | Status: DC | PRN

## 2015-08-22 NOTE — Assessment & Plan Note (Signed)
Continue wellbutrin. PHQ2  Suggests good control.

## 2015-08-22 NOTE — Patient Instructions (Signed)
Increase water intake to 64 oz daily. Continue to follow BPS.  Use alprazolam as needed. Continue wellbutrin.

## 2015-08-22 NOTE — Progress Notes (Signed)
Pre visit review using our clinic review tool, if applicable. No additional management support is needed unless otherwise documented below in the visit note. 

## 2015-08-22 NOTE — Assessment & Plan Note (Signed)
Well controlled. ? Dizziness due to  Low water intake, increase. Follow. May need to decrease verapamil.

## 2015-08-22 NOTE — Progress Notes (Signed)
   Subjective:    Patient ID: Heather Haley, female    DOB: 05/11/1960, 55 y.o.   MRN: [id number]  HPI  55 year old female presents for follow up HTN and mood.  Hypertension:    At goal here today. Has been running a little low on verapamil daily BP Readings from Last 3 Encounters:  08/22/15 128/68  08/06/15 127/89  06/17/15 116/72  Using medication without problems or lightheadedness:  occ when stannding Chest pain with exertion: None Edema:None Short of breath: none Average home BPs: 117/60... Does not seem to drop after standing per pt Other issues:   GAD, depression, major: She reports  She is no longer taking  buspar because she feels it is making her moody and irritable. She feels better with irritabiltiy but still very anxious in particular situations.  In crowds, around a lot of people.. She gets very closed in and scared..  depression fairly well controlled on on wellbutrin  XL 300 mg as well as   Trazodone for insomnia.  GAD7: 6/21 PHQ9:1  In past she has been on xanax for mood to use   Review of Systems  Constitutional: Negative for fever and fatigue.  HENT: Negative for ear pain.   Eyes: Negative for pain.  Respiratory: Negative for chest tightness and shortness of breath.   Cardiovascular: Negative for chest pain, palpitations and leg swelling.  Gastrointestinal: Negative for abdominal pain.  Genitourinary: Negative for dysuria.       Objective:   Physical Exam  Constitutional: Vital signs are normal. She appears well-developed and well-nourished. She is cooperative.  Non-toxic appearance. She does not appear ill. No distress.  overweight  HENT:  Head: Normocephalic.  Right Ear: Hearing, tympanic membrane, external ear and ear canal normal. Tympanic membrane is not erythematous, not retracted and not bulging.  Left Ear: Hearing, tympanic membrane, external ear and ear canal normal. Tympanic membrane is not erythematous, not retracted and not bulging.    Nose: No mucosal edema or rhinorrhea. Right sinus exhibits no maxillary sinus tenderness and no frontal sinus tenderness. Left sinus exhibits no maxillary sinus tenderness and no frontal sinus tenderness.  Mouth/Throat: Uvula is midline, oropharynx is clear and moist and mucous membranes are normal.  Eyes: Conjunctivae, EOM and lids are normal. Pupils are equal, round, and reactive to light. Lids are everted and swept, no foreign bodies found.  Neck: Trachea normal and normal range of motion. Neck supple. Carotid bruit is not present. No thyroid mass and no thyromegaly present.  Cardiovascular: Normal rate, regular rhythm, S1 normal, S2 normal, normal heart sounds, intact distal pulses and normal pulses.  Exam reveals no gallop and no friction rub.   No murmur heard. Pulmonary/Chest: Effort normal and breath sounds normal. No tachypnea. No respiratory distress. She has no decreased breath sounds. She has no wheezes. She has no rhonchi. She has no rales.  Abdominal: Soft. Normal appearance and bowel sounds are normal. There is no hepatosplenomegaly. There is no tenderness. There is no rigidity, no guarding and no CVA tenderness.  Musculoskeletal:  Brace on left leg  Neurological: She is alert.  Skin: Skin is warm, dry and intact. No rash noted.  Psychiatric: Her speech is normal and behavior is normal. Judgment and thought content normal. Her mood appears not anxious. Cognition and memory are normal. She does not exhibit a depressed mood.          Assessment & Plan:

## 2015-08-22 NOTE — Assessment & Plan Note (Signed)
Stable control on wellbutrin. 

## 2015-08-22 NOTE — Assessment & Plan Note (Signed)
Not daily symptoms. GAD appears well controlled on GAD7. Will use alprazolam on limited basis.

## 2015-08-26 ENCOUNTER — Encounter: Payer: Self-pay | Admitting: Pain Medicine

## 2015-09-03 ENCOUNTER — Ambulatory Visit (INDEPENDENT_AMBULATORY_CARE_PROVIDER_SITE_OTHER): Payer: Medicare Other | Admitting: Family Medicine

## 2015-09-03 ENCOUNTER — Ambulatory Visit (INDEPENDENT_AMBULATORY_CARE_PROVIDER_SITE_OTHER)
Admission: RE | Admit: 2015-09-03 | Discharge: 2015-09-03 | Disposition: A | Discharge status: Home / Self Care | Payer: Medicare Other | Source: Ambulatory Visit | Attending: Family Medicine | Admitting: Family Medicine

## 2015-09-03 ENCOUNTER — Encounter: Payer: Self-pay | Admitting: Family Medicine

## 2015-09-03 VITALS — BP 114/79 | HR 73 | Temp 97.6°F | Ht 65.0 in | Wt 183.8 lb

## 2015-09-03 DIAGNOSIS — M25532 Pain in left wrist: Secondary | ICD-10-CM

## 2015-09-03 NOTE — Progress Notes (Signed)
Dr. Frederico Hamman T. Zykeriah Mathia, MD, [facility] and [facility] Medicine [place], [address] Phone: [phone] Fax: [phone]  09/03/2015  Patient: Heather Haley, MRN: [id number], DOB: [birth year], 55 y.o.  Primary Physician:  Eliezer Lofts, MD   Chief Complaint  Patient presents with  . Wrist Pain    Left   Subjective:   [name] is a 55 y.o. very pleasant female patient who presents with the following:  Pushing a cart at [employer] and felt a sharp pain, about 10 days ago. Points to the volar of her wrist. Certain ways it will hurt with movement. She has been trying to rest it, but it is persistent.  There is been some mild swelling, but no bruising.  This is her first medical evaluation.  She has not had any numbness, and she has not had any weakness or dropping and problems with her grip and she has full function of her hand.  Past Medical History, Surgical History, Social History, Family History, Problem List, Medications, and Allergies have been reviewed and updated if relevant.  Patient Active Problem List   Diagnosis Date Noted  . Dysuria 06/17/2015  . Pure hypercholesterolemia 05/29/2015  . Opioid-induced constipation (OIC) 05/19/2015  . Osteoarthritis of hip (Right) 05/08/2015  . Chronic hip pain (Right) 05/05/2015  . Long term prescription opiate use 05/05/2015  . Chronic knee pain (S/P TKR:Total Knee Replacement) (Left) 05/05/2015  . Osteoarthritis of hips (Location of Secondary source of pain) (Bilateral) (Right) 05/05/2015  . History of total knee replacement (Left) 05/05/2015  . Chronic groin pain (Location of Secondary source of pain) (Right) 05/05/2015  . Chronic lower extremity pain (Location of Tertiary source of pain) (Bilateral) (R>L) 05/05/2015  . Chronic pain 02/05/2015  . Chronic ankle pain (secondary to a work-related injury) (Date of injury 04/17/2006) (Left) 12/25/2014  . Chronic low back pain (Location of  Primary Pain) (Bilateral) (R>L) 12/25/2014  . Lumbar spondylosis (L3-4 & L4-5) 12/25/2014  . Lumbar facet syndrome (Location of Primary Source of Pain) (Bilateral) (R>L) 12/25/2014  . Encounter for therapeutic drug level monitoring 11/19/2014  . Encounter for long-term opiate analgesic use 11/19/2014  . Long-term current use of opiate analgesic 11/19/2014  . Uncomplicated opioid dependence ([place]) 11/19/2014  . Opiate use (35 MME/Day) 11/19/2014  . Chronic pain syndrome 11/19/2014  . Diabetic sensory peripheral neuropathy (Bilateral Lower Extremity) 11/19/2014  . Generalized anxiety disorder 11/19/2014  . History of panic attacks 11/19/2014  . History of tobacco abuse 11/19/2014  . GERD (gastroesophageal reflux disease) 11/19/2014  . Non-insulin dependent type 2 diabetes mellitus ([place]) 11/19/2014  . Coccygeal pain 11/19/2014  . History of migraine 11/19/2014  . Chronic constipation 10/24/2013  . Personal history of other diseases of the digestive system 10/24/2013  . Leg length inequality 09/12/2012  . Morbid obesity ([place]) 05/26/2012  . Hypertension   . IBS (irritable bowel syndrome)   . Depression, major, recurrent, moderate ([place])   . ADVERSE DRUG REACTION 12/15/2009    Past Medical History:  Diagnosis Date  . Anxiety   . Depression   . Diabetes mellitus ([place])   . Frequency of urination   . GERD (gastroesophageal reflux disease)   . Headache(784.0)   . High cholesterol   . Hypertension   . IBS (irritable bowel syndrome)   . Left ankle instability   . Left knee DJD   . Lumbar Degenerative Disc Disease of  10/11/2014  . Neuromuscular disorder ([place])   .  Osteoarthritis of hip (Right) 05/05/2015  . Other enthesopathy of ankle and tarsus 12/15/2009   Qualifier: Diagnosis of  By: Oneida Alar MD, KARL    . Peripheral sensory neuropathy (Bilateral) 11/19/2014  . Postoperative nausea and vomiting     Past Surgical History:  Procedure Laterality Date  . ABDOMINAL HYSTERECTOMY    .  ANKLE SURGERY    . APPENDECTOMY    . COLONOSCOPY  2013  . ESOPHAGOGASTRODUODENOSCOPY (EGD) WITH PROPOFOL N/A 10/03/2014   Procedure: ESOPHAGOGASTRODUODENOSCOPY (EGD) WITH PROPOFOL;  Surgeon: Josefine Class, MD;  Location: [facility] ENDOSCOPY;  Service: Endoscopy;  Laterality: N/A;  . KNEE ARTHROSCOPY  1997   left knee  . LEFT HEART CATHETERIZATION WITH CORONARY ANGIOGRAM N/A 01/11/2013   Procedure: LEFT HEART CATHETERIZATION WITH CORONARY ANGIOGRAM;  Surgeon: Sinclair Grooms, MD;  Location[facility] C[facility] LAB;  Service: Cardiovascular;  Laterality: N/A;  . TOTAL KNEE ARTHROPLASTY  08/30/2011   Procedure: TOTAL KNEE ARTHROPLASTY;  Surgeon: Lorn Junes, MD;  Location: [facility];  Service: Orthopedics;  Laterality: Left;    Social History   Social History  . Marital status: Divorced    Spouse name: N/A  . Number of children: 2  . Years of education: 11   Occupational History  .  [place] History Main Topics  . Smoking status: Former Smoker    Packs/day: 2.00    Years: 15.00    Types: Cigarettes    Quit date: 02/09/1995  . Smokeless tobacco: Never Used  . Alcohol use No  . Drug use: No  . Sexual activity: Not Currently   Other Topics Concern  . Not on file   Social History Narrative   Patient lives at home alone. Patient works at [employer].   Caffeine daily- 2   Right handed.   Education- 11 th grade    Family History  Problem Relation Age of Onset  . Cancer Mother   . Hypertension Father   . Heart disease Father     Allergies  Allergen Reactions  . Buprenorphine Hcl Other (See Comments)    Unable to void  . Codeine Hives    REACTION: hives  . Cymbalta [Duloxetine Hcl] Other (See Comments)    Altered mental status  . Duloxetine Other (See Comments)    Altered mental status Alopecia, visual hallucinations, nightmares  . Gabapentin Swelling  . Lyrica [Pregabalin] Other (See Comments)    Alopecia, visual hallucinations, nightmares Altered mental  status Alopecia, visual hallucinations, nightmares Altered mental status  . Morphine And Related Other (See Comments)    Unable to void  . Nortripytline Hcl [Nortriptyline] Other (See Comments)    Hair loss and night mares Hair loss and night mares  . Nsaids     REACTION: palpitations, diaphoresis  . Penicillin G Nausea And Vomiting  . Penicillins     REACTION: upset stomach  . Tolmetin Other (See Comments)    REACTION: palpitations, diaphoresis    Medication list reviewed and updated in full in [place].  GEN: No fevers, chills. Nontoxic. Primarily MSK c/o today. MSK: Detailed in the HPI GI: tolerating PO intake without difficulty Neuro: No numbness, parasthesias, or tingling associated. Otherwise the pertinent positives of the ROS are noted above.   Objective:   BP 114/79   Pulse 73   Temp 97.6 F (36.4 C) (Oral)   Ht 5\' 5"  (1.651 m)   Wt 183 lb 12 oz (83.3 kg)   BMI 30.58 kg/m  GEN: WDWN, NAD, Non-toxic, Alert & Oriented x 3 HEENT: Atraumatic, Normocephalic.  Ears and Nose: No external deformity. EXTR: No clubbing/cyanosis/edema NEURO: Normal gait.  PSYCH: Normally interactive. Conversant. Not depressed or anxious appearing.  Calm demeanor.    Patient has decreased range of motion with extension, flexion, radial and ulnar deviation at the wrist, all of which calls or at least some mild pain.  Nontender along the radius and ulna.  Nontender at the anatomical snuffbox.  She does have some tenderness at the [facility] joint on the first digit.  She also has  Some pain in the  Thenar and hyperthenar eminences.  Nontender throughout all fingers as well as MCPs.  Radiology: Dg Wrist Complete Left  Result Date: 09/03/2015 CLINICAL DATA:  Left wrist pain for 10 days, no trauma EXAM: LEFT WRIST - COMPLETE 3+ VIEW COMPARISON:  None. FINDINGS: The left radiocarpal joint space appears normal. The carpal bones are normal position with normal intercarpal spaces. Normal  alignment is maintained. IMPRESSION: Negative. Electronically Signed   By: Ivar Drape M.D.   On: 09/03/2015 11:56    Assessment and Plan:   Left wrist pain - Plan: DG Wrist Complete Left  Most likely derangement of the wrist, ligamentous structure in the carpal bones region.   Place the patient a thumb spica splint, and reassess in 3 weeks.  No fractures are seen on x-ray.  Follow-up: Return in about 3 weeks (around 09/24/2015).  Orders Placed This Encounter  Procedures  . DG Wrist Complete Left    Signed,  Lagena Strand T. Elyza Whitt, MD   Patient's Medications  New Prescriptions   No medications on file  Previous Medications   ALPRAZOLAM (XANAX) 0.25 MG TABLET    Take 1 tablet (0.25 mg total) by mouth daily as needed for anxiety.   ASPIRIN EC 81 MG TABLET    Take 81 mg by mouth daily.   BISACODYL (DULCOLAX) 5 MG EC TABLET    Take 2 tablets (10 mg total) by mouth daily as needed for moderate constipation.   BUPROPION (WELLBUTRIN XL) 300 MG 24 HR TABLET    Take 1 tablet (300 mg total) by mouth daily.   CETIRIZINE (ZYRTEC) 10 MG TABLET    Take 1 tablet (10 mg total) by mouth at bedtime.   CHOLECALCIFEROL (VITAMIN D) 2000 UNITS CAPS    Take 2,000 Units by mouth daily.   CLINDAMYCIN (CLEOCIN) 300 MG CAPSULE    TK 2 CS PO 1 HOUR PRIOR TO DENTAL WORK   DOCUSATE SODIUM (COLACE) 100 MG CAPSULE    Take 100 mg by mouth daily.   FLUTICASONE (FLONASE) 50 MCG/ACT NASAL SPRAY    Place 2 sprays into both nostrils daily as needed for allergies.    HYDROCODONE-ACETAMINOPHEN (NORCO/VICODIN) 5-325 MG TABLET    Take 1 tablet by mouth daily as needed for severe pain.   HYDROCODONE-ACETAMINOPHEN (NORCO/VICODIN) 5-325 MG TABLET    Take 1 tablet by mouth daily as needed for severe pain.   HYPROMELLOSE (ARTIFICIAL TEARS OP)    Apply 2 drops to eye daily as needed (for dry eyes). Dry eye   LACTULOSE (CHRONULAC) 10 GM/15ML SOLUTION    Take 10 g by mouth 2 (two) times daily as needed for mild constipation.    METFORMIN (GLUCOPHAGE) 500 MG TABLET    Take 1 tablet (500 mg total) by mouth 2 (two) times daily with a meal.   OMEPRAZOLE (PRILOSEC) 40 MG CAPSULE    Take 1 capsule (40 mg total) by  mouth daily as needed (indigestion).   POLYETHYLENE GLYCOL POWDER (GLYCOLAX/MIRALAX) POWDER    Take 255 g by mouth once.   PRAVASTATIN (PRAVACHOL) 40 MG TABLET    Take 40 mg by mouth at bedtime.   RANITIDINE (ZANTAC) 150 MG CAPSULE    Take 150 mg by mouth as needed for heartburn.   SUMATRIPTAN (IMITREX) 100 MG TABLET    Take 1 tab at onset of headache, may take second dose after 1 hour. Do not take more than 2 a week   TOPIRAMATE (TOPAMAX) 100 MG TABLET    Take 2 tablets (200 mg total) by mouth 2 (two) times daily.   TRAMADOL (ULTRAM) 50 MG TABLET    Take 2 tablets (100 mg total) by mouth every 8 (eight) hours as needed for moderate pain or severe pain.   TRAZODONE (DESYREL) 50 MG TABLET    Take 50 mg by mouth at bedtime.   VERAPAMIL (CALAN-SR) 120 MG CR TABLET    Take 1 tablet (120 mg total) by mouth at bedtime.  Modified Medications   No medications on file  Discontinued Medications   No medications on file

## 2015-09-03 NOTE — Progress Notes (Signed)
Pre visit review using our clinic review tool, if applicable. No additional management support is needed unless otherwise documented below in the visit note. 

## 2015-09-24 ENCOUNTER — Ambulatory Visit: Payer: Self-pay | Admitting: Family Medicine

## 2015-10-05 ENCOUNTER — Other Ambulatory Visit: Payer: Self-pay | Admitting: Family Medicine

## 2015-10-06 ENCOUNTER — Encounter: Payer: Self-pay | Admitting: Family Medicine

## 2015-10-06 ENCOUNTER — Telehealth: Payer: Self-pay

## 2015-10-06 NOTE — Telephone Encounter (Signed)
Workers comp received a Engineer, technical sales and has a question about a icd number

## 2015-10-06 NOTE — Telephone Encounter (Signed)
Jocelyn Lamer could you assist with this please?  Thank you

## 2015-10-23 ENCOUNTER — Encounter: Payer: Self-pay | Admitting: Pain Medicine

## 2015-11-12 DIAGNOSIS — G4440 Drug-induced headache, not elsewhere classified, not intractable: Secondary | ICD-10-CM | POA: Insufficient documentation

## 2015-11-12 DIAGNOSIS — R251 Tremor, unspecified: Secondary | ICD-10-CM | POA: Insufficient documentation

## 2015-11-20 ENCOUNTER — Telehealth: Payer: Self-pay | Admitting: Pain Medicine

## 2015-11-20 NOTE — Telephone Encounter (Addendum)
Colinda from [facility] and [employer], lawyer for Sandi Carne, called asking for letter stating that patient has been dismissed from clinic, stating paatient is out of meds and neither Lawyer or Worker's Comp was notified. Patient is out of meds and states Dr. Dossie Arbour said he would cover meds until October.    Phone [phone] Fax [phone]

## 2015-11-24 ENCOUNTER — Other Ambulatory Visit: Payer: Self-pay

## 2015-11-24 NOTE — Telephone Encounter (Signed)
I believe information is in last appointment note.

## 2015-11-27 ENCOUNTER — Ambulatory Visit: Payer: Self-pay | Admitting: Family Medicine

## 2015-12-04 ENCOUNTER — Telehealth: Payer: Self-pay | Admitting: Family Medicine

## 2015-12-04 DIAGNOSIS — E7800 Pure hypercholesterolemia, unspecified: Secondary | ICD-10-CM

## 2015-12-04 DIAGNOSIS — Z1159 Encounter for screening for other viral diseases: Secondary | ICD-10-CM

## 2015-12-04 NOTE — Telephone Encounter (Signed)
-----   Message from Marchia Bond sent at 11/28/2015  3:40 PM EDT ----- Regarding: 3 mo f/u labs Fri 10/27, need orders. Thanks! :-) Please order  future f/u labs for pt's upcoming lab appt. Thanks Aniceto Boss

## 2015-12-08 ENCOUNTER — Other Ambulatory Visit: Payer: Medicare Other

## 2015-12-12 ENCOUNTER — Telehealth: Payer: Self-pay | Admitting: Family Medicine

## 2015-12-12 ENCOUNTER — Ambulatory Visit: Payer: Medicare Other | Admitting: Family Medicine

## 2015-12-12 DIAGNOSIS — Z0289 Encounter for other administrative examinations: Secondary | ICD-10-CM

## 2015-12-12 NOTE — Telephone Encounter (Signed)
Reschedule.. Have see Kristeen Miss first for AMW and me for PART 2.

## 2015-12-12 NOTE — Telephone Encounter (Signed)
Patient did not come in for their appointment today for medicare wellness.  Please let me know if patient needs to be contacted immediately for follow up or no follow up needed.

## 2015-12-15 ENCOUNTER — Other Ambulatory Visit: Payer: Self-pay | Admitting: Internal Medicine

## 2015-12-15 DIAGNOSIS — N183 Chronic kidney disease, stage 3 unspecified: Secondary | ICD-10-CM

## 2015-12-15 DIAGNOSIS — I130 Hypertensive heart and chronic kidney disease with heart failure and stage 1 through stage 4 chronic kidney disease, or unspecified chronic kidney disease: Secondary | ICD-10-CM

## 2015-12-15 NOTE — Telephone Encounter (Signed)
Per pt, she has switched doctors a month ago

## 2015-12-17 ENCOUNTER — Encounter: Payer: Self-pay | Admitting: Family Medicine

## 2016-02-04 ENCOUNTER — Ambulatory Visit: Payer: Medicare Other

## 2016-02-10 DIAGNOSIS — R109 Unspecified abdominal pain: Secondary | ICD-10-CM | POA: Diagnosis not present

## 2016-02-23 DIAGNOSIS — N200 Calculus of kidney: Secondary | ICD-10-CM | POA: Diagnosis not present

## 2016-02-23 DIAGNOSIS — R1032 Left lower quadrant pain: Secondary | ICD-10-CM | POA: Diagnosis not present

## 2016-02-23 DIAGNOSIS — R1031 Right lower quadrant pain: Secondary | ICD-10-CM | POA: Diagnosis not present

## 2016-03-09 DIAGNOSIS — R0602 Shortness of breath: Secondary | ICD-10-CM | POA: Diagnosis not present

## 2016-03-09 DIAGNOSIS — R05 Cough: Secondary | ICD-10-CM | POA: Diagnosis not present

## 2016-03-09 DIAGNOSIS — I10 Essential (primary) hypertension: Secondary | ICD-10-CM | POA: Diagnosis not present

## 2016-03-09 DIAGNOSIS — J181 Lobar pneumonia, unspecified organism: Secondary | ICD-10-CM | POA: Diagnosis not present

## 2016-03-09 DIAGNOSIS — R079 Chest pain, unspecified: Secondary | ICD-10-CM | POA: Diagnosis not present

## 2016-03-17 DIAGNOSIS — I10 Essential (primary) hypertension: Secondary | ICD-10-CM | POA: Diagnosis not present

## 2016-03-17 DIAGNOSIS — J181 Lobar pneumonia, unspecified organism: Secondary | ICD-10-CM | POA: Diagnosis not present

## 2016-03-22 ENCOUNTER — Telehealth: Payer: Self-pay | Admitting: *Deleted

## 2016-03-22 NOTE — Telephone Encounter (Signed)
Patient has not been seen in nearly three years.  She was last seen for migraines.  Instructed her to start with her PCP to determine if a referral is needed and to which type of specialist would benefit her the most.  She was agreeable to this plan.

## 2016-03-23 ENCOUNTER — Encounter: Payer: Self-pay | Admitting: Sports Medicine

## 2016-03-23 ENCOUNTER — Ambulatory Visit (INDEPENDENT_AMBULATORY_CARE_PROVIDER_SITE_OTHER): Payer: Medicare HMO | Admitting: Sports Medicine

## 2016-03-23 ENCOUNTER — Ambulatory Visit: Payer: Self-pay

## 2016-03-23 VITALS — BP 132/70 | HR 75 | Ht 65.0 in | Wt 175.0 lb

## 2016-03-23 DIAGNOSIS — M6240 Contracture of muscle, unspecified site: Secondary | ICD-10-CM | POA: Diagnosis not present

## 2016-03-23 DIAGNOSIS — M79642 Pain in left hand: Secondary | ICD-10-CM

## 2016-03-23 DIAGNOSIS — M720 Palmar fascial fibromatosis [Dupuytren]: Secondary | ICD-10-CM

## 2016-03-23 MED ORDER — DICLOFENAC SODIUM 1 % TD GEL: 2.0000 g | Freq: Four times a day (QID) | TRANSDERMAL | 0 refills | Status: DC

## 2016-03-23 NOTE — Progress Notes (Signed)
  Heather Haley - 56 y.o. female MRN [id number]  Date of birth: 27-Dec-1960  SUBJECTIVE:   CC: left Hand Pain  HPI: Patient states that she has been having left hand pain for the last 9 months. She describes incident when she is pushing a cart at [facility] and turned her hand the wrong way. She describes a popping sensation and tenderness thereafter. Now she's describing stiffness and decreased range of motion in the hand and wrist. Associated weakness. She has had workup from other providers that have been unremarkable. She's had 2 x-rays showed no broken bones. She's tried a brace without helping. Pain comes and goes.   She says that is getting worse. She is right-hand dominant but uses her left hand predominantly now due to wearing her can with arm support on right. Pain in hand can be sharp and shooting.  ROS per HPI.  Some numbness along thumb and first 2 fingers of left No neck pain or radicular sxs to hand   HISTORY: Past Medical, Surgical, Social, and Family History Reviewed & Updated per EMR.   Pertinent Historical Findings include: Multiple MSK issues including OA, lumbar spondylosis, chronic pain  PHYSICAL EXAM:  BP 132/70   Pulse 75   Ht 5\' 5"  (1.651 m)   Wt 175 lb (79.4 kg)   BMI 29.12 kg/m   General: alert, chronically ill-appearing, NAD, cooperative Msk: Bilateral hands with signs of muscle atrophy. Slight tremor Left hand - with obvious Depuytren's contractures.  Thick nodular change over tendons. Loss of full flexion. Lateral MCP joints are TTP  Left wrist with decreased ROM and stiffness. Strength in hand 4/5 compared to 5/5 on RT. Decreased sensation over median nerve distribution.   Pulses: Intact  Neurologic: No focal deficits, abnormal gain, A&Ox3.  Skin: Intact without suspicious lesions or rashes. Warm and dry.   Limited MSK ultrasound of left hand: Thickened tendon sheaths appreciated on the 2-3 MC; also appreciated a thickened area that causes  catching of the tendon.  The 4-5th MCP joint have a sesamoid bone within flexor tendons.  No obvious signs of carpal tunnel based on normal appearance of median nerve.  Left radial carpal joint  Normal and ulno carpal joint with mild lateral swelling but no true effusion CMC joint did not show DJD  Findings consistent with Depuytren's contractures; no obvious Rado-carpal or ulno-carpal arthritis  Ultrasound and interpretation by Wolfgang Phoenix. Fields, MD    ASSESSMENT & PLAN:   1. Left hand pain Left hand with multiple deformities appreciated. She has atrophy and dupuytren's contractures, . This is most likely due to overuse of hand. -will do conservative treatment at this time -soft wrist wrap recommended -Rx for topical Voltaren to apply to dorsum and plantar surface of hand 3-4x/qd -placing hand in warm water to work on ROM -may eventually need referral to hand surgeon for injections  -follow-up in 4-6 weeks Orders: - [place] LIMITED JOINT SPACE STRUCTURES UP LEFT; Future  Luiz Blare, DO 03/23/2016, 3:08 PM PGY-3, [facility]  .I observed and examined the patient with the resident and agree with assessment and plan.  Note reviewed and modified by me. Stefanie Libel, MD

## 2016-03-23 NOTE — Assessment & Plan Note (Signed)
She may need to see hand surgery for collagenase or surgical release if pain persists  Try conservative care

## 2016-03-23 NOTE — Patient Instructions (Signed)
Plan: Apply topical medication to hand 3-4x a day. Apply to back and front of hand Put hand in warm water a couple times a day and work your hand to get better range of motion Wear soft hand/wrist brace for comfort  Follow-up in 4-6 weeks

## 2016-03-24 ENCOUNTER — Telehealth: Payer: Self-pay | Admitting: *Deleted

## 2016-03-24 NOTE — Telephone Encounter (Signed)
Per Dr. Oneida Alar, she can use OTC Arnica Gel/Cream 3-4 times a day

## 2016-04-02 DIAGNOSIS — M549 Dorsalgia, unspecified: Secondary | ICD-10-CM | POA: Diagnosis not present

## 2016-04-02 DIAGNOSIS — M2170 Unequal limb length (acquired), unspecified site: Secondary | ICD-10-CM | POA: Diagnosis not present

## 2016-04-02 DIAGNOSIS — Z6829 Body mass index (BMI) 29.0-29.9, adult: Secondary | ICD-10-CM | POA: Diagnosis not present

## 2016-04-06 ENCOUNTER — Other Ambulatory Visit: Payer: Self-pay | Admitting: Orthopedic Surgery

## 2016-04-06 DIAGNOSIS — M549 Dorsalgia, unspecified: Secondary | ICD-10-CM

## 2016-04-09 DIAGNOSIS — E119 Type 2 diabetes mellitus without complications: Secondary | ICD-10-CM | POA: Diagnosis not present

## 2016-04-09 DIAGNOSIS — I10 Essential (primary) hypertension: Secondary | ICD-10-CM | POA: Diagnosis not present

## 2016-04-09 DIAGNOSIS — R300 Dysuria: Secondary | ICD-10-CM | POA: Diagnosis not present

## 2016-04-19 DIAGNOSIS — M533 Sacrococcygeal disorders, not elsewhere classified: Secondary | ICD-10-CM | POA: Diagnosis not present

## 2016-04-19 DIAGNOSIS — M79605 Pain in left leg: Secondary | ICD-10-CM | POA: Diagnosis not present

## 2016-04-19 DIAGNOSIS — M25562 Pain in left knee: Secondary | ICD-10-CM | POA: Diagnosis not present

## 2016-04-19 DIAGNOSIS — G8929 Other chronic pain: Secondary | ICD-10-CM | POA: Diagnosis not present

## 2016-04-19 DIAGNOSIS — M24542 Contracture, left hand: Secondary | ICD-10-CM | POA: Diagnosis not present

## 2016-04-19 DIAGNOSIS — G5602 Carpal tunnel syndrome, left upper limb: Secondary | ICD-10-CM | POA: Diagnosis not present

## 2016-04-29 DIAGNOSIS — M533 Sacrococcygeal disorders, not elsewhere classified: Secondary | ICD-10-CM | POA: Diagnosis not present

## 2016-04-29 DIAGNOSIS — M79605 Pain in left leg: Secondary | ICD-10-CM | POA: Diagnosis not present

## 2016-04-29 DIAGNOSIS — M25562 Pain in left knee: Secondary | ICD-10-CM | POA: Diagnosis not present

## 2016-04-29 DIAGNOSIS — M24542 Contracture, left hand: Secondary | ICD-10-CM | POA: Diagnosis not present

## 2016-04-29 DIAGNOSIS — G8929 Other chronic pain: Secondary | ICD-10-CM | POA: Diagnosis not present

## 2016-04-29 DIAGNOSIS — G5602 Carpal tunnel syndrome, left upper limb: Secondary | ICD-10-CM | POA: Diagnosis not present

## 2016-05-06 ENCOUNTER — Ambulatory Visit: Payer: Self-pay | Admitting: Sports Medicine

## 2016-05-11 DIAGNOSIS — M79605 Pain in left leg: Secondary | ICD-10-CM | POA: Diagnosis not present

## 2016-05-14 DIAGNOSIS — M25532 Pain in left wrist: Secondary | ICD-10-CM | POA: Diagnosis not present

## 2016-06-03 ENCOUNTER — Emergency Department
Admission: EM | Admit: 2016-06-03 | Discharge: 2016-06-04 | Disposition: A | Discharge status: Home / Self Care | Payer: Medicare HMO | Attending: Emergency Medicine | Admitting: Emergency Medicine

## 2016-06-03 ENCOUNTER — Emergency Department: Payer: Medicare HMO

## 2016-06-03 ENCOUNTER — Encounter: Payer: Self-pay | Admitting: Emergency Medicine

## 2016-06-03 DIAGNOSIS — Z7982 Long term (current) use of aspirin: Secondary | ICD-10-CM | POA: Diagnosis not present

## 2016-06-03 DIAGNOSIS — E119 Type 2 diabetes mellitus without complications: Secondary | ICD-10-CM | POA: Diagnosis not present

## 2016-06-03 DIAGNOSIS — R51 Headache: Secondary | ICD-10-CM | POA: Insufficient documentation

## 2016-06-03 DIAGNOSIS — R1013 Epigastric pain: Secondary | ICD-10-CM | POA: Insufficient documentation

## 2016-06-03 DIAGNOSIS — Z79899 Other long term (current) drug therapy: Secondary | ICD-10-CM | POA: Diagnosis not present

## 2016-06-03 DIAGNOSIS — Z87891 Personal history of nicotine dependence: Secondary | ICD-10-CM | POA: Insufficient documentation

## 2016-06-03 DIAGNOSIS — I10 Essential (primary) hypertension: Secondary | ICD-10-CM | POA: Diagnosis not present

## 2016-06-03 DIAGNOSIS — R0781 Pleurodynia: Secondary | ICD-10-CM | POA: Diagnosis not present

## 2016-06-03 DIAGNOSIS — G8929 Other chronic pain: Secondary | ICD-10-CM

## 2016-06-03 DIAGNOSIS — R519 Headache, unspecified: Secondary | ICD-10-CM

## 2016-06-03 HISTORY — DX: Other chronic pain: G89.29

## 2016-06-03 LAB — BASIC METABOLIC PANEL
ANION GAP: 8 (ref 5–15)
BUN: 21 mg/dL — ABNORMAL HIGH (ref 6–20)
CALCIUM: 9.4 mg/dL (ref 8.9–10.3)
CO2: 24 mmol/L (ref 22–32)
Chloride: 107 mmol/L (ref 101–111)
Creatinine, Ser: 1.09 mg/dL — ABNORMAL HIGH (ref 0.44–1.00)
GFR, EST NON AFRICAN AMERICAN: 55 mL/min — AB (ref >60)
Glucose, Bld: 109 mg/dL — ABNORMAL HIGH (ref 65–99)
POTASSIUM: 3.9 mmol/L (ref 3.5–5.1)
Sodium: 139 mmol/L (ref 135–145)

## 2016-06-03 LAB — CBC
HEMATOCRIT: 44.2 % (ref 35.0–47.0)
Hemoglobin: 15 g/dL (ref 12.0–16.0)
MCH: 29.9 pg (ref 26.0–34.0)
MCHC: 33.9 g/dL (ref 32.0–36.0)
MCV: 88.2 fL (ref 80.0–100.0)
Platelets: 234 10*3/uL (ref 150–440)
RBC: 5.02 MIL/uL (ref 3.80–5.20)
RDW: 13.4 % (ref 11.5–14.5)
WBC: 7.8 10*3/uL (ref 3.6–11.0)

## 2016-06-03 LAB — TROPONIN I

## 2016-06-03 NOTE — ED Provider Notes (Signed)
[facility] Emergency Department Provider Note  ____________________________________________   First MD Initiated Contact with Patient 06/03/16 2309     (approximate)  I have reviewed the triage vital signs and the nursing notes.   HISTORY  Chief Complaint Chest Pain and Headache    HPI Heather Haley is a 56 y.o. female with an extensive past medical history that includes multiple chronic pain issues as well as migraines, history of atypical chest pain, chronic back pain, chronic left wrist pain, etc., who presents for evaluation of about 3 days of pain in her epigastrium or lower part of her anterior chest which radiates around both sides.  She reports that the pain is at times mild that at times severe.  Nothing in particular makes it better nor worse.  She describes it as a dull but sometimes sharp pain.  She denies fever/chills, nausea, vomiting, chest pain, shortness of breath.  She also complains of a headache which she describes in bilateral temples and feels sharp and stabbing.  She says this comes and goes and has been present for months.  She reports that it is currently moderate and she takes Imitrex at home which sometimes helps.  Nothing particular makes it worse.  She has no numbness, weakness, nor tingling in any of her extremities.  She again denies nausea and vomiting and also denies visual changes.  She reports that she was treated for pneumonia 2 months ago and has had follow-ups with her primary care doctor, Dr. Candiss Norse, but has not discussed the headaches with her.   Past Medical History:  Diagnosis Date  . Anxiety   . Chronic pain    previously saw Dr. Consuela Mimes in pain clinic, then saw pain specialist in [place]  . Depression   . Diabetes mellitus ([place])   . Frequency of urination   . GERD (gastroesophageal reflux disease)   . Headache(784.0)   . High cholesterol   . Hypertension   . IBS (irritable bowel syndrome)   . Left  ankle instability   . Left knee DJD   . Lumbar Degenerative Disc Disease of  10/11/2014  . Neuromuscular disorder ([place])   . Osteoarthritis of hip (Right) 05/05/2015  . Other enthesopathy of ankle and tarsus 12/15/2009   Qualifier: Diagnosis of  By: Oneida Alar MD, KARL    . Peripheral sensory neuropathy (Bilateral) 11/19/2014  . Postoperative nausea and vomiting     Patient Active Problem List   Diagnosis Date Noted  . Contracture, tendon sheath 03/23/2016  . Dysuria 06/17/2015  . Pure hypercholesterolemia 05/29/2015  . Opioid-induced constipation (OIC) 05/19/2015  . Osteoarthritis of hip (Right) 05/08/2015  . Chronic hip pain (Right) 05/05/2015  . Long term prescription opiate use 05/05/2015  . Chronic knee pain (S/P TKR:Total Knee Replacement) (Left) 05/05/2015  . Osteoarthritis of hips (Location of Secondary source of pain) (Bilateral) (Right) 05/05/2015  . History of total knee replacement (Left) 05/05/2015  . Chronic groin pain (Location of Secondary source of pain) (Right) 05/05/2015  . Chronic lower extremity pain (Location of Tertiary source of pain) (Bilateral) (R>L) 05/05/2015  . Chronic pain 02/05/2015  . Chronic ankle pain (secondary to a work-related injury) (Date of injury 04/17/2006) (Left) 12/25/2014  . Chronic low back pain (Location of Primary Pain) (Bilateral) (R>L) 12/25/2014  . Lumbar spondylosis (L3-4 & L4-5) 12/25/2014  . Lumbar facet syndrome (Location of Primary Source of Pain) (Bilateral) (R>L) 12/25/2014  . Encounter for therapeutic drug level monitoring 11/19/2014  . Encounter for  long-term opiate analgesic use 11/19/2014  . Long-term current use of opiate analgesic 11/19/2014  . Uncomplicated opioid dependence ([place]) 11/19/2014  . Opiate use (35 MME/Day) 11/19/2014  . Chronic pain syndrome 11/19/2014  . Diabetic sensory peripheral neuropathy (Bilateral Lower Extremity) 11/19/2014  . Generalized anxiety disorder 11/19/2014  . History of panic attacks 11/19/2014   . History of tobacco abuse 11/19/2014  . GERD (gastroesophageal reflux disease) 11/19/2014  . Non-insulin dependent type 2 diabetes mellitus ([place]) 11/19/2014  . Coccygeal pain 11/19/2014  . History of migraine 11/19/2014  . Chronic constipation 10/24/2013  . Personal history of other diseases of the digestive system 10/24/2013  . Leg length inequality 09/12/2012  . Morbid obesity ([place]) 05/26/2012  . Hypertension   . IBS (irritable bowel syndrome)   . Depression, major, recurrent, moderate ([place])   . ADVERSE DRUG REACTION 12/15/2009    Past Surgical History:  Procedure Laterality Date  . ABDOMINAL HYSTERECTOMY    . ANKLE SURGERY    . APPENDECTOMY    . COLONOSCOPY  2013  . ESOPHAGOGASTRODUODENOSCOPY (EGD) WITH PROPOFOL N/A 10/03/2014   Procedure: ESOPHAGOGASTRODUODENOSCOPY (EGD) WITH PROPOFOL;  Surgeon: Josefine Class, MD;  Location[facility]COPY;  Service: Endoscopy;  Laterality: N/A;  . KNEE ARTHROSCOPY  1997   left knee  . LEFT HEART CATHETERIZATION WITH CORONARY ANGIOGRAM N/A 01/11/2013   Procedure: LEFT HEART CATHETERIZATION WITH CORONARY ANGIOGRAM;  Surgeon: Sinclair Grooms, MD;  Location: [facility] CATH LAB;  Service: Cardiovascular;  Laterality: N/A;  . TOTAL KNEE ARTHROPLASTY  08/30/2011   Procedure: TOTAL KNEE ARTHROPLASTY;  Surgeon: Lorn Junes, MD;  Location: [facility];  Service: Orthopedics;  Laterality: Left;    Prior to Admission medications   Medication Sig Start Date End Date Taking? Authorizing Provider  albuterol (PROVENTIL HFA;VENTOLIN HFA) 108 (90 Base) MCG/ACT inhaler Inhale into the lungs. 03/17/16 03/17/17  Historical Provider, MD  ALPRAZolam Duanne Moron) 0.25 MG tablet Take 1 tablet (0.25 mg total) by mouth daily as needed for anxiety. 08/22/15   Amy Cletis [place], MD  aspirin EC 81 MG tablet Take 81 mg by mouth daily. 01/08/13   Belva Crome, MD  bisacodyl (DULCOLAX) 5 MG EC tablet Take 2 tablets (10 mg total) by mouth daily as needed for moderate constipation. 08/06/15    Milinda Pointer, MD  buPROPion (WELLBUTRIN XL) 300 MG 24 hr tablet TAKE 1 TABLET BY MOUTH EVERY DAY 10/05/15   Amy Cletis [place], MD  cetirizine (ZYRTEC) 10 MG tablet Take 1 tablet (10 mg total) by mouth at bedtime. Patient taking differently: Take 10 mg by mouth daily as needed for allergies.     Susy Frizzle, MD  Cholecalciferol (VITAMIN D) 2000 UNITS CAPS Take 2,000 Units by mouth daily.    Historical Provider, MD  clindamycin (CLEOCIN) 300 MG capsule TK 2 CS PO 1 HOUR PRIOR TO DENTAL WORK 03/10/15   Historical Provider, MD  diclofenac sodium (VOLTAREN) 1 % GEL Apply 2 g topically 4 (four) times daily. To hand 03/23/16   Katheren Shams, DO  docusate sodium (COLACE) 100 MG capsule Take 100 mg by mouth daily.    Historical Provider, MD  fluticasone (FLONASE) 50 MCG/ACT nasal spray Place 2 sprays into both nostrils daily as needed for allergies.     Historical Provider, MD  HYDROcodone-acetaminophen (NORCO/VICODIN) 5-325 MG tablet Take 1 tablet by mouth daily as needed for severe pain. 08/06/15   Milinda Pointer, MD  HYDROcodone-acetaminophen (NORCO/VICODIN) 5-325 MG tablet Take 1 tablet by mouth  daily as needed for severe pain. 08/06/15   Milinda Pointer, MD  Hypromellose (ARTIFICIAL TEARS OP) Apply 2 drops to eye daily as needed (for dry eyes). Dry eye    Historical Provider, MD  lactulose (CHRONULAC) 10 GM/15ML solution Take 10 g by mouth 2 (two) times daily as needed for mild constipation.    Historical Provider, MD  metFORMIN (GLUCOPHAGE) 500 MG tablet Take 1 tablet (500 mg total) by mouth 2 (two) times daily with a meal. 07/09/15   Amy E Diona Browner, MD  omeprazole (PRILOSEC) 40 MG capsule Take 1 capsule (40 mg total) by mouth daily as needed (indigestion). 07/09/15   Amy Cletis [place], MD  polyethylene glycol powder (GLYCOLAX/MIRALAX) powder Take 255 g by mouth once. 08/06/15   Milinda Pointer, MD  pravastatin (PRAVACHOL) 40 MG tablet Take 40 mg by mouth at bedtime.    Historical Provider, MD    ranitidine (ZANTAC) 150 MG capsule Take 150 mg by mouth as needed for heartburn.    Historical Provider, MD  rizatriptan (MAXALT) 10 MG tablet TK 1 T PO PRN FOR UP TO 30 DAYS FOR MIGRAINE. MAY REPEAT IN 2 H IF NEEDED. 02/05/16   Historical Provider, MD  SUMAtriptan (IMITREX) 100 MG tablet Take 1 tab at onset of headache, may take second dose after 1 hour. Do not take more than 2 a week 08/14/15   Jinny Sanders, MD  topiramate (TOPAMAX) 100 MG tablet Take 2 tablets (200 mg total) by mouth 2 (two) times daily. 07/26/15   Amy Cletis [place], MD  traMADol (ULTRAM) 50 MG tablet Take 2 tablets (100 mg total) by mouth every 8 (eight) hours as needed for moderate pain or severe pain. 08/06/15   Milinda Pointer, MD  traZODone (DESYREL) 50 MG tablet Take 50 mg by mouth at bedtime.    Historical Provider, MD  verapamil (CALAN-SR) 120 MG CR tablet Take 1 tablet (120 mg total) by mouth at bedtime. 07/26/15   Jinny Sanders, MD    Allergies Buprenorphine hcl; Codeine; Cymbalta [duloxetine hcl]; Duloxetine; Gabapentin; Lyrica [pregabalin]; Morphine and related; Nortripytline hcl [nortriptyline]; Nsaids; Penicillin g; Penicillins; Tolmetin; and Wellbutrin [bupropion]  Family History  Problem Relation Age of Onset  . Cancer Mother   . Hypertension Father   . Heart disease Father     Social History Social History  Substance Use Topics  . Smoking status: Former Smoker    Packs/day: 2.00    Years: 15.00    Types: Cigarettes    Quit date: 02/09/1995  . Smokeless tobacco: Never Used  . Alcohol use No    Review of Systems Constitutional: No fever/chills Eyes: No visual changes. ENT: No sore throat. Cardiovascular: Denies chest pain. Respiratory: Denies shortness of breath. Gastrointestinal: Upper abdominal pain in the epigastrium.  No nausea, no vomiting.  No diarrhea.  No constipation. Genitourinary: Negative for dysuria. Musculoskeletal: Negative for back pain. Integumentary: Negative for  rash. Neurological: Bitemporal headaches for months, no focal weakness or numbness.   ____________________________________________   PHYSICAL EXAM:  VITAL SIGNS: ED Triage Vitals  Enc Vitals Group     BP 06/03/16 2035 (!) 147/89     Pulse Rate 06/03/16 2035 93     Resp 06/03/16 2035 16     Temp 06/03/16 2035 98.2 F (36.8 C)     Temp Source 06/03/16 2035 Oral     SpO2 06/03/16 2035 99 %     Weight 06/03/16 2036 183 lb (83 kg)     Height --  Head Circumference --      Peak Flow --      Pain Score 06/03/16 2244 7     Pain Loc --      Pain Edu? --      Excl. in [place]? --     Constitutional: Alert and oriented. Well appearing and in no acute distress. Eyes: Conjunctivae are normal. PERRL. EOMI. Head: Atraumatic. Nontender temples bilaterally. Nose: No congestion/rhinnorhea. Mouth/Throat: Mucous membranes are moist. Neck: No stridor.  No meningeal signs.   Cardiovascular: Normal rate, regular rhythm. Good peripheral circulation. Grossly normal heart sounds. Respiratory: Normal respiratory effort.  No retractions. Lungs CTAB. Gastrointestinal: Soft and nontender. No distention. Specifically she has a negative Murphy sign and no tenderness of the epigastrium as well as no right lower quadrant tenderness to palpation Musculoskeletal: No lower extremity tenderness nor edema. No gross deformities of extremities. Neurologic:  Normal speech and language. No gross focal neurologic deficits are appreciated.  Skin:  Skin is warm, dry and intact. No rash noted. Psychiatric: Mood and affect are normal. Speech and behavior are normal.  ____________________________________________   LABS (all labs ordered are listed, but only abnormal results are displayed)  Labs Reviewed  BASIC METABOLIC PANEL - Abnormal; Notable for the following:       Result Value   Glucose, Bld 109 (*)    BUN 21 (*)    Creatinine, Ser 1.09 (*)    GFR calc non Af Amer 55 (*)    All other components within  normal limits  CBC  TROPONIN I   ____________________________________________  EKG  ED ECG REPORT I, Terron Merfeld, the attending physician, personally viewed and interpreted this ECG.  Date: 06/03/2016 EKG Time: 20:40 Rate: 83 Rhythm: normal sinus rhythm QRS Axis: normal Intervals: normal ST/T Wave abnormalities: Inverted T-wave in lead 3, otherwise unremarkable Conduction Disturbances: none Narrative Interpretation: Non-specific ST segment / T-wave changes, but no evidence of acute ischemia.  ____________________________________________  RADIOLOGY   Dg Chest 2 View  Result Date: 06/03/2016 CLINICAL DATA:  Headache with anterior lower rib pain EXAM: CHEST  2 VIEW COMPARISON:  05/17/2015, 05/16/2015 FINDINGS: The heart size and mediastinal contours are within normal limits. Both lungs are clear. Degenerative changes of the spine. IMPRESSION: No active cardiopulmonary disease. Electronically Signed   By: Donavan Foil M.D.   On: 06/03/2016 21:04    ____________________________________________   PROCEDURES  Critical Care performed: No   Procedure(s) performed:   Procedures   ____________________________________________   INITIAL IMPRESSION / ASSESSMENT AND PLAN / ED COURSE  Pertinent labs & imaging results that were available during my care of the patient were reviewed by me and considered in my medical decision making (see chart for details).  The patient has no acute or emergent findings on physical exam nor on her lab work, x-ray, nor EKG.  She seemed to feel better or more reassured after I spent some time talking with her about her symptoms.  I explained that her workup was reassuring in terms of the lower chest/upper abdominal pain and that I do not think that there is any severe or emergent intra-abdominal issue including but not limited to gallbladder disease or pancreatitis.  She is completely nontender to palpation and has had no nausea nor vomiting.  I  explained I think that most likely her symptoms are musculoskeletal.  There is nothing to make me suspect ACS nor PE (Wells Score for PE is zero).   I offered IV treatment of headache/migraine or Fioricet  by mouth, and she says she would rather just go home and go to bed.  I explained that I feel this is appropriate and acceptable.  I gave my usual and customary return precautions.   She will call Dr. Candiss Norse tomorrow to schedule a follow-up appointment.      ____________________________________________  FINAL CLINICAL IMPRESSION(S) / ED DIAGNOSES  Final diagnoses:  Epigastric pain  Chronic nonintractable headache, unspecified headache type     MEDICATIONS GIVEN DURING THIS VISIT:  Medications - No data to display   NEW OUTPATIENT MEDICATIONS STARTED DURING THIS VISIT:  New Prescriptions   No medications on file    Modified Medications   No medications on file    Discontinued Medications   No medications on file     Note:  This document was prepared using Dragon voice recognition software and may include unintentional dictation errors.    Hinda Kehr, MD 06/04/16 [phone]

## 2016-06-03 NOTE — ED Notes (Signed)
ED Provider at bedside. 

## 2016-06-03 NOTE — ED Triage Notes (Signed)
Pt ambulatory to triage with slow gait, assisted device used. Pt c/o of headache and anterior lower rib pain that radiates into back x2 days. Pt reports she was diagnosed with pneumonia 2 months ago. Pt denies cough, congestion or SOB at this time.

## 2016-06-04 NOTE — Discharge Instructions (Signed)
As we discussed, your workup today was reassuring.  Though we do not know exactly what is causing your symptoms, it appears that you have no emergent medical condition at this time and that you are safe to go home and follow up as recommended in this paperwork. ° °Please return immediately to the Emergency Department if you develop any new or worsening symptoms that concern you. ° °

## 2016-06-07 ENCOUNTER — Emergency Department (HOSPITAL_COMMUNITY)
Admission: EM | Admit: 2016-06-07 | Discharge: 2016-06-07 | Disposition: A | Discharge status: Home / Self Care | Payer: Medicare HMO | Attending: Emergency Medicine | Admitting: Emergency Medicine

## 2016-06-07 ENCOUNTER — Encounter (HOSPITAL_COMMUNITY): Payer: Self-pay

## 2016-06-07 ENCOUNTER — Emergency Department (HOSPITAL_COMMUNITY): Payer: Medicare HMO

## 2016-06-07 DIAGNOSIS — R1013 Epigastric pain: Secondary | ICD-10-CM

## 2016-06-07 DIAGNOSIS — E1140 Type 2 diabetes mellitus with diabetic neuropathy, unspecified: Secondary | ICD-10-CM | POA: Insufficient documentation

## 2016-06-07 DIAGNOSIS — Z87891 Personal history of nicotine dependence: Secondary | ICD-10-CM | POA: Diagnosis not present

## 2016-06-07 DIAGNOSIS — Z96652 Presence of left artificial knee joint: Secondary | ICD-10-CM | POA: Diagnosis not present

## 2016-06-07 DIAGNOSIS — G43109 Migraine with aura, not intractable, without status migrainosus: Secondary | ICD-10-CM | POA: Diagnosis not present

## 2016-06-07 DIAGNOSIS — Z7982 Long term (current) use of aspirin: Secondary | ICD-10-CM | POA: Insufficient documentation

## 2016-06-07 DIAGNOSIS — Z79899 Other long term (current) drug therapy: Secondary | ICD-10-CM | POA: Diagnosis not present

## 2016-06-07 DIAGNOSIS — Z7984 Long term (current) use of oral hypoglycemic drugs: Secondary | ICD-10-CM | POA: Diagnosis not present

## 2016-06-07 DIAGNOSIS — R109 Unspecified abdominal pain: Secondary | ICD-10-CM

## 2016-06-07 DIAGNOSIS — I10 Essential (primary) hypertension: Secondary | ICD-10-CM | POA: Diagnosis not present

## 2016-06-07 DIAGNOSIS — R1011 Right upper quadrant pain: Secondary | ICD-10-CM | POA: Diagnosis not present

## 2016-06-07 DIAGNOSIS — R51 Headache: Secondary | ICD-10-CM | POA: Diagnosis present

## 2016-06-07 LAB — LIPASE, BLOOD: Lipase: 29 U/L (ref 11–51)

## 2016-06-07 LAB — I-STAT TROPONIN, ED: TROPONIN I, POC: 0 ng/mL (ref 0.00–0.08)

## 2016-06-07 LAB — CBC
HEMATOCRIT: 49.6 % — AB (ref 36.0–46.0)
HEMOGLOBIN: 16.6 g/dL — AB (ref 12.0–15.0)
MCH: 29.3 pg (ref 26.0–34.0)
MCHC: 33.5 g/dL (ref 30.0–36.0)
MCV: 87.6 fL (ref 78.0–100.0)
Platelets: 227 10*3/uL (ref 150–400)
RBC: 5.66 MIL/uL — AB (ref 3.87–5.11)
RDW: 12.9 % (ref 11.5–15.5)
WBC: 10.8 10*3/uL — ABNORMAL HIGH (ref 4.0–10.5)

## 2016-06-07 LAB — HEPATIC FUNCTION PANEL
ALT: 16 U/L (ref 14–54)
AST: 20 U/L (ref 15–41)
Albumin: 4.6 g/dL (ref 3.5–5.0)
Alkaline Phosphatase: 88 U/L (ref 38–126)
Total Bilirubin: 0.9 mg/dL (ref 0.3–1.2)
Total Protein: 8.1 g/dL (ref 6.5–8.1)

## 2016-06-07 LAB — BASIC METABOLIC PANEL
ANION GAP: 12 (ref 5–15)
BUN: 17 mg/dL (ref 6–20)
CO2: 20 mmol/L — AB (ref 22–32)
Calcium: 10 mg/dL (ref 8.9–10.3)
Chloride: 106 mmol/L (ref 101–111)
Creatinine, Ser: 0.98 mg/dL (ref 0.44–1.00)
GLUCOSE: 142 mg/dL — AB (ref 65–99)
POTASSIUM: 3.8 mmol/L (ref 3.5–5.1)
Sodium: 138 mmol/L (ref 135–145)

## 2016-06-07 MED ORDER — SODIUM CHLORIDE 0.9 % IV BOLUS (SEPSIS)
1000.0000 mL | Freq: Once | INTRAVENOUS | Status: AC
  Administered 2016-06-07: 1000 mL via INTRAVENOUS

## 2016-06-07 MED ORDER — DIPHENHYDRAMINE HCL 50 MG/ML IJ SOLN
25.0000 mg | Freq: Once | INTRAMUSCULAR | Status: AC
  Administered 2016-06-07: 25 mg via INTRAVENOUS
  Filled 2016-06-07: qty 1

## 2016-06-07 MED ORDER — DEXAMETHASONE SODIUM PHOSPHATE 10 MG/ML IJ SOLN
10.0000 mg | Freq: Once | INTRAMUSCULAR | Status: AC
  Administered 2016-06-07: 10 mg via INTRAVENOUS
  Filled 2016-06-07: qty 1

## 2016-06-07 MED ORDER — ONDANSETRON 4 MG PO TBDP: 4.0000 mg | ORAL_TABLET | Freq: Three times a day (TID) | ORAL | 0 refills | Status: AC | PRN

## 2016-06-07 MED ORDER — METOCLOPRAMIDE HCL 5 MG/ML IJ SOLN
10.0000 mg | Freq: Once | INTRAMUSCULAR | Status: AC
  Administered 2016-06-07: 10 mg via INTRAVENOUS
  Filled 2016-06-07: qty 2

## 2016-06-07 MED ORDER — GI COCKTAIL ~~LOC~~
30.0000 mL | Freq: Once | ORAL | Status: AC
  Administered 2016-06-07: 30 mL via ORAL
  Filled 2016-06-07: qty 30

## 2016-06-07 NOTE — ED Notes (Signed)
Pt reports she has "electricity running through her." Pointing at area on right foot where ink pen mark appears to be. Pt is poor historian and has multiple complaints. Also states she has a foul odor "down there and feels gushy. States this has been going on since she had her appendix removed in 2014." Vitals are all stable.

## 2016-06-07 NOTE — ED Triage Notes (Signed)
Per Pt, PT is coming from home with multiple complaints. Pt is a poor historian and only answers short questions. Pt report being seen at [facility] for similar complaints, but reports it is ten times worse. Complains og headache for over a year with some chest palpitations and stomach pain along with one episode of vomiting.

## 2016-06-07 NOTE — ED Provider Notes (Signed)
[facility] DEPT Provider Note   CSN: [id number] Arrival date & time: 06/07/16  1020     History   Chief Complaint Chief Complaint  Patient presents with  . Headache  . Abdominal Pain    HPI Heather Haley is a 56 y.o. female.  The history is provided by the patient.  Headache   This is a chronic problem. Episode onset: 4 yrs; worse in the last two. Episode frequency: intermittent. The problem has been gradually worsening. The pain is located in the bilateral and temporal region. The quality of the pain is described as sharp. The pain is moderate. The pain does not radiate. Associated symptoms include nausea and vomiting. Pertinent negatives include no fever.  Abdominal Pain   This is a recurrent problem. Episode onset: 2 weeks. The problem occurs constantly. The problem has not changed since onset.The pain is located in the epigastric region. The quality of the pain is sharp and shooting. The pain is moderate. Associated symptoms include nausea, vomiting and headaches. Pertinent negatives include fever. The symptoms are aggravated by eating. Nothing relieves the symptoms. Past medical history comments: prior h/o H.pylori.    Past Medical History:  Diagnosis Date  . Anxiety   . Chronic pain    previously saw Dr. Consuela Mimes in pain clinic, then saw pain specialist in [place]  . Depression   . Diabetes mellitus ([place])   . Frequency of urination   . GERD (gastroesophageal reflux disease)   . Headache(784.0)   . High cholesterol   . Hypertension   . IBS (irritable bowel syndrome)   . Left ankle instability   . Left knee DJD   . Lumbar Degenerative Disc Disease of  10/11/2014  . Neuromuscular disorder ([place])   . Osteoarthritis of hip (Right) 05/05/2015  . Other enthesopathy of ankle and tarsus 12/15/2009   Qualifier: Diagnosis of  By: Oneida Alar MD, KARL    . Peripheral sensory neuropathy (Bilateral) 11/19/2014  . Postoperative nausea and vomiting     Patient Active Problem  List   Diagnosis Date Noted  . Contracture, tendon sheath 03/23/2016  . Dysuria 06/17/2015  . Pure hypercholesterolemia 05/29/2015  . Opioid-induced constipation (OIC) 05/19/2015  . Osteoarthritis of hip (Right) 05/08/2015  . Chronic hip pain (Right) 05/05/2015  . Long term prescription opiate use 05/05/2015  . Chronic knee pain (S/P TKR:Total Knee Replacement) (Left) 05/05/2015  . Osteoarthritis of hips (Location of Secondary source of pain) (Bilateral) (Right) 05/05/2015  . History of total knee replacement (Left) 05/05/2015  . Chronic groin pain (Location of Secondary source of pain) (Right) 05/05/2015  . Chronic lower extremity pain (Location of Tertiary source of pain) (Bilateral) (R>L) 05/05/2015  . Chronic pain 02/05/2015  . Chronic ankle pain (secondary to a work-related injury) (Date of injury 04/17/2006) (Left) 12/25/2014  . Chronic low back pain (Location of Primary Pain) (Bilateral) (R>L) 12/25/2014  . Lumbar spondylosis (L3-4 & L4-5) 12/25/2014  . Lumbar facet syndrome (Location of Primary Source of Pain) (Bilateral) (R>L) 12/25/2014  . Encounter for therapeutic drug level monitoring 11/19/2014  . Encounter for long-term opiate analgesic use 11/19/2014  . Long-term current use of opiate analgesic 11/19/2014  . Uncomplicated opioid dependence ([place]) 11/19/2014  . Opiate use (35 MME/Day) 11/19/2014  . Chronic pain syndrome 11/19/2014  . Diabetic sensory peripheral neuropathy (Bilateral Lower Extremity) 11/19/2014  . Generalized anxiety disorder 11/19/2014  . History of panic attacks 11/19/2014  . History of tobacco abuse 11/19/2014  . GERD (gastroesophageal reflux disease) 11/19/2014  .  Non-insulin dependent type 2 diabetes mellitus ([place]) 11/19/2014  . Coccygeal pain 11/19/2014  . History of migraine 11/19/2014  . Chronic constipation 10/24/2013  . Personal history of other diseases of the digestive system 10/24/2013  . Leg length inequality 09/12/2012  . Morbid obesity  ([place]) 05/26/2012  . Hypertension   . IBS (irritable bowel syndrome)   . Depression, major, recurrent, moderate ([place])   . ADVERSE DRUG REACTION 12/15/2009    Past Surgical History:  Procedure Laterality Date  . ABDOMINAL HYSTERECTOMY    . ANKLE SURGERY    . APPENDECTOMY    . COLONOSCOPY  2013  . ESOPHAGOGASTRODUODENOSCOPY (EGD) WITH PROPOFOL N/A 10/03/2014   Procedure: ESOPHAGOGASTRODUODENOSCOPY (EGD) WITH PROPOFOL;  Surgeon: Josefine Class, MD;  Location: [facility] ENDOSCOPY;  Service: Endoscopy;  Laterality: N/A;  . KNEE ARTHROSCOPY  1997   left knee  . LEFT HEART CATHETERIZATION WITH CORONARY ANGIOGRAM N/A 01/11/2013   Procedure: LEFT HEART CATHETERIZATION WITH CORONARY ANGIOGRAM;  Surgeon: Sinclair Grooms, MD;  Location: [facility] CATH LAB;  Service: Cardiovascular;  Laterality: N/A;  . TOTAL KNEE ARTHROPLASTY  08/30/2011   Procedure: TOTAL KNEE ARTHROPLASTY;  Surgeon: Lorn Junes, MD;  Location: [facility];  Service: Orthopedics;  Laterality: Left;    OB History    No data available       Home Medications    Prior to Admission medications   Medication Sig Start Date End Date Taking? Authorizing Provider  albuterol (PROVENTIL HFA;VENTOLIN HFA) 108 (90 Base) MCG/ACT inhaler Inhale into the lungs. 03/17/16 03/17/17  Historical Provider, MD  ALPRAZolam Duanne Moron) 0.25 MG tablet Take 1 tablet (0.25 mg total) by mouth daily as needed for anxiety. 08/22/15   Amy Cletis Athens, MD  aspirin EC 81 MG tablet Take 81 mg by mouth daily. 01/08/13   Belva Crome, MD  bisacodyl (DULCOLAX) 5 MG EC tablet Take 2 tablets (10 mg total) by mouth daily as needed for moderate constipation. 08/06/15   Milinda Pointer, MD  buPROPion (WELLBUTRIN XL) 300 MG 24 hr tablet TAKE 1 TABLET BY MOUTH EVERY DAY 10/05/15   Amy Cletis Athens, MD  cetirizine (ZYRTEC) 10 MG tablet Take 1 tablet (10 mg total) by mouth at bedtime. Patient taking differently: Take 10 mg by mouth daily as needed for allergies.     Susy Frizzle, MD    Cholecalciferol (VITAMIN D) 2000 UNITS CAPS Take 2,000 Units by mouth daily.    Historical Provider, MD  clindamycin (CLEOCIN) 300 MG capsule TK 2 CS PO 1 HOUR PRIOR TO DENTAL WORK 03/10/15   Historical Provider, MD  diclofenac sodium (VOLTAREN) 1 % GEL Apply 2 g topically 4 (four) times daily. To hand 03/23/16   Katheren Shams, DO  docusate sodium (COLACE) 100 MG capsule Take 100 mg by mouth daily.    Historical Provider, MD  fluticasone (FLONASE) 50 MCG/ACT nasal spray Place 2 sprays into both nostrils daily as needed for allergies.     Historical Provider, MD  HYDROcodone-acetaminophen (NORCO/VICODIN) 5-325 MG tablet Take 1 tablet by mouth daily as needed for severe pain. 08/06/15   Milinda Pointer, MD  HYDROcodone-acetaminophen (NORCO/VICODIN) 5-325 MG tablet Take 1 tablet by mouth daily as needed for severe pain. 08/06/15   Milinda Pointer, MD  Hypromellose (ARTIFICIAL TEARS OP) Apply 2 drops to eye daily as needed (for dry eyes). Dry eye    Historical Provider, MD  lactulose (CHRONULAC) 10 GM/15ML solution Take 10 g by mouth 2 (two) times daily as needed  for mild constipation.    Historical Provider, MD  metFORMIN (GLUCOPHAGE) 500 MG tablet Take 1 tablet (500 mg total) by mouth 2 (two) times daily with a meal. 07/09/15   Amy E Diona Browner, MD  omeprazole (PRILOSEC) 40 MG capsule Take 1 capsule (40 mg total) by mouth daily as needed (indigestion). 07/09/15   Amy Cletis Athens, MD  ondansetron (ZOFRAN ODT) 4 MG disintegrating tablet Take 1 tablet (4 mg total) by mouth every 8 (eight) hours as needed for nausea or vomiting. 06/07/16 06/10/16  Fatima Blank, MD  polyethylene glycol powder (GLYCOLAX/MIRALAX) powder Take 255 g by mouth once. 08/06/15   Milinda Pointer, MD  pravastatin (PRAVACHOL) 40 MG tablet Take 40 mg by mouth at bedtime.    Historical Provider, MD  ranitidine (ZANTAC) 150 MG capsule Take 150 mg by mouth as needed for heartburn.    Historical Provider, MD  rizatriptan (MAXALT) 10 MG  tablet TK 1 T PO PRN FOR UP TO 30 DAYS FOR MIGRAINE. MAY REPEAT IN 2 H IF NEEDED. 02/05/16   Historical Provider, MD  SUMAtriptan (IMITREX) 100 MG tablet Take 1 tab at onset of headache, may take second dose after 1 hour. Do not take more than 2 a week 08/14/15   Jinny Sanders, MD  topiramate (TOPAMAX) 100 MG tablet Take 2 tablets (200 mg total) by mouth 2 (two) times daily. 07/26/15   Amy Cletis Athens, MD  traMADol (ULTRAM) 50 MG tablet Take 2 tablets (100 mg total) by mouth every 8 (eight) hours as needed for moderate pain or severe pain. 08/06/15   Milinda Pointer, MD  traZODone (DESYREL) 50 MG tablet Take 50 mg by mouth at bedtime.    Historical Provider, MD  verapamil (CALAN-SR) 120 MG CR tablet Take 1 tablet (120 mg total) by mouth at bedtime. 07/26/15   Amy Cletis Athens, MD    Family History Family History  Problem Relation Age of Onset  . Cancer Mother   . Hypertension Father   . Heart disease Father     Social History Social History  Substance Use Topics  . Smoking status: Former Smoker    Packs/day: 2.00    Years: 15.00    Types: Cigarettes    Quit date: 02/09/1995  . Smokeless tobacco: Never Used  . Alcohol use No     Allergies   Buprenorphine hcl; Codeine; Cymbalta [duloxetine hcl]; Duloxetine; Gabapentin; Lyrica [pregabalin]; Morphine and related; Nortripytline hcl [nortriptyline]; Nsaids; Penicillin g; Penicillins; Tolmetin; and Wellbutrin [bupropion]   Review of Systems Review of Systems  Constitutional: Negative for fever.  Gastrointestinal: Positive for abdominal pain, nausea and vomiting.  Neurological: Positive for headaches.  All other systems are reviewed and are negative for acute change except as noted in the HPI    Physical Exam Updated Vital Signs BP (!) 167/99   Pulse 84   Temp 97.4 F (36.3 C) (Oral)   Resp 16   Ht 5\' 5"  (1.651 m)   Wt 180 lb (81.6 kg)   SpO2 99%   BMI 29.95 kg/m   Physical Exam  Constitutional: She is oriented to person, place,  and time. She appears well-developed and well-nourished. No distress.  HENT:  Head: Normocephalic and atraumatic.  Nose: Nose normal.  Eyes: Conjunctivae and EOM are normal. Pupils are equal, round, and reactive to light. Right eye exhibits no discharge. Left eye exhibits no discharge. No scleral icterus.  Neck: Normal range of motion. Neck supple.  Cardiovascular: Normal rate and regular rhythm.  Exam reveals no gallop and no friction rub.   No murmur heard. Pulmonary/Chest: Effort normal and breath sounds normal. No stridor. No respiratory distress. She has no rales.  Abdominal: Soft. She exhibits no distension. There is tenderness in the right upper quadrant and epigastric area. There is no rigidity, no rebound, no guarding, no CVA tenderness and negative Murphy's sign.  Musculoskeletal: She exhibits no edema or tenderness.  Neurological: She is alert and oriented to person, place, and time.  Mental Status: Alert and oriented to person, place, and time. Attention and concentration normal. Speech clear. Recent memory is intact  Cranial Nerves  II Visual Fields: Intact to confrontation. Visual fields intact. III, IV, VI: Pupils equal and reactive to light and near. Full eye movement without nystagmus  V Facial Sensation: Normal. No weakness of masticatory muscles  VII: No facial weakness or asymmetry  VIII Auditory Acuity: Grossly normal  IX/X: The uvula is midline; the palate elevates symmetrically  XI: Normal sternocleidomastoid and trapezius strength  XII: The tongue is midline. No atrophy or fasciculations.   Motor System: Muscle Strength: 5/5 and symmetric in the upper and lower extremities. No pronation or drift.  Muscle Tone: Tone and muscle bulk are normal in the upper and lower extremities.   Reflexes: DTRs: 1+ and symmetrical in all four extremities. Plantar responses are flexor bilaterally.  Coordination: Intact finger-to-nose, heel-to-shin, and rapid alternating movements. No  tremor.  Sensation: Intact to light touch, and pinprick. Gait: Routine gait normal    Skin: Skin is warm and dry. No rash noted. She is not diaphoretic. No erythema.  Psychiatric: She has a normal mood and affect.  Vitals reviewed.    ED Treatments / Results  Labs (all labs ordered are listed, but only abnormal results are displayed) Labs Reviewed  BASIC METABOLIC PANEL - Abnormal; Notable for the following:       Result Value   CO2 20 (*)    Glucose, Bld 142 (*)    All other components within normal limits  CBC - Abnormal; Notable for the following:    WBC 10.8 (*)    RBC 5.66 (*)    Hemoglobin 16.6 (*)    HCT 49.6 (*)    All other components within normal limits  HEPATIC FUNCTION PANEL - Abnormal; Notable for the following:    Bilirubin, Direct <0.1 (*)    All other components within normal limits  LIPASE, BLOOD  I-STAT TROPOININ, ED    EKG  EKG Interpretation  Date/Time:  Monday June 07 2016 10:31:05 EDT Ventricular Rate:  96 PR Interval:  146 QRS Duration: 82 QT Interval:  342 QTC Calculation: 432 R Axis:   57 Text Interpretation:  Normal sinus rhythm Nonspecific T wave abnormality Abnormal ECG Otherwise no significant change Confirmed by [facility] MD, Jacqueleen Pulver ([id number]) on 06/07/2016 11:47:52 AM       Radiology US Abdomen Limited Ruq  Result Date: 06/07/2016 CLINICAL DATA:  Two week history of right upper quadrant pain. EXAM: US ABDOMEN LIMITED - RIGHT UPPER QUADRANT COMPARISON:  CT scan 02/23/2016 FINDINGS: Gallbladder: No gallstones or gallbladder wall thickening. No pericholecystic fluid. The sonographer reports no sonographic Murphy's sign. Common bile duct: Diameter: 3 mm Liver: No focal lesion identified. Within normal limits in parenchymal echogenicity. IMPRESSION: Normal right upper quadrant ultrasound. Electronically Signed   By: Misty Stanley M.D.   On: 06/07/2016 13:50    Procedures Procedures (including critical care time)  Medications Ordered in  ED Medications  gi cocktail (Maalox,Lidocaine,Donnatal) (  30 mLs Oral Given 06/07/16 1226)  diphenhydrAMINE (BENADRYL) injection 25 mg (25 mg Intravenous Given 06/07/16 1234)  metoCLOPramide (REGLAN) injection 10 mg (10 mg Intravenous Given 06/07/16 1234)  dexamethasone (DECADRON) injection 10 mg (10 mg Intravenous Given 06/07/16 1234)  sodium chloride 0.9 % bolus 1,000 mL (0 mLs Intravenous Stopped 06/07/16 1434)     Initial Impression / Assessment and Plan / ED Course  I have reviewed the triage vital signs and the nursing notes.  Pertinent labs & imaging results that were available during my care of the patient were reviewed by me and considered in my medical decision making (see chart for details).     1. Headache Typical headache for the pt. Non focal neuro exam. No recent head trauma. No fever. Doubt meningitis. Doubt intracranial bleed. Doubt IIH. No indication for imaging. Will treat with migraine cocktail and reevaluate.  Significant improvement in her headache   2. Epigastric abd pain History of H. pylori. Epigastric and right upper quadrant tenderness to palpation, without evidence of peritonitis. Labs grossly reassuring without evidence of pancreatitis, biliary obstruction. Right upper quadrant ultrasound without evidence of acute cholecystitis. No lower abdominal tenderness to palpation that would be concerning for colitis, diverticulitis, appendicitis, ovarian torsion, PID, or urinary tract infection.  Treated symptomatically and patient was able to tolerate by mouth.  The patient is safe for discharge with strict return precautions.    Final Clinical Impressions(s) / ED Diagnoses   Final diagnoses:  Abdominal pain  Migraine with aura and without status migrainosus, not intractable  Epigastric pain   Disposition: Discharge  Condition: Good  I have discussed the results, Dx and Tx plan with the patient who expressed understanding and agree(s) with the plan. Discharge  instructions discussed at great length. The patient was given strict return precautions who verbalized understanding of the instructions. No further questions at time of discharge.    New Prescriptions   ONDANSETRON (ZOFRAN ODT) 4 MG DISINTEGRATING TABLET    Take 1 tablet (4 mg total) by mouth every 8 (eight) hours as needed for nausea or vomiting.    Follow Up: Glendon Axe, MD [place] [address] [phone]  Schedule an appointment as soon as possible for a visit  in 5-7 days, If symptoms do not improve or  worsen      Fatima Blank, MD 06/07/16 [phone]

## 2016-06-07 NOTE — ED Notes (Addendum)
Pt transporting to [place]. VSS.

## 2016-06-09 ENCOUNTER — Emergency Department (HOSPITAL_COMMUNITY): Payer: Medicare HMO

## 2016-06-09 ENCOUNTER — Emergency Department (HOSPITAL_COMMUNITY)
Admission: EM | Admit: 2016-06-09 | Discharge: 2016-06-10 | Disposition: A | Discharge status: Other Acute Inpatient Hospital | Payer: Medicare HMO | Attending: Emergency Medicine | Admitting: Emergency Medicine

## 2016-06-09 ENCOUNTER — Encounter (HOSPITAL_COMMUNITY): Payer: Self-pay | Admitting: Emergency Medicine

## 2016-06-09 DIAGNOSIS — Z87891 Personal history of nicotine dependence: Secondary | ICD-10-CM | POA: Insufficient documentation

## 2016-06-09 DIAGNOSIS — I10 Essential (primary) hypertension: Secondary | ICD-10-CM | POA: Diagnosis not present

## 2016-06-09 DIAGNOSIS — Z7984 Long term (current) use of oral hypoglycemic drugs: Secondary | ICD-10-CM | POA: Insufficient documentation

## 2016-06-09 DIAGNOSIS — E1142 Type 2 diabetes mellitus with diabetic polyneuropathy: Secondary | ICD-10-CM | POA: Insufficient documentation

## 2016-06-09 DIAGNOSIS — Z7982 Long term (current) use of aspirin: Secondary | ICD-10-CM | POA: Insufficient documentation

## 2016-06-09 DIAGNOSIS — Z96652 Presence of left artificial knee joint: Secondary | ICD-10-CM | POA: Diagnosis not present

## 2016-06-09 DIAGNOSIS — R443 Hallucinations, unspecified: Secondary | ICD-10-CM | POA: Diagnosis not present

## 2016-06-09 DIAGNOSIS — R51 Headache: Secondary | ICD-10-CM | POA: Diagnosis not present

## 2016-06-09 DIAGNOSIS — R4182 Altered mental status, unspecified: Secondary | ICD-10-CM | POA: Diagnosis not present

## 2016-06-09 DIAGNOSIS — R102 Pelvic and perineal pain: Secondary | ICD-10-CM | POA: Diagnosis not present

## 2016-06-09 LAB — COMPREHENSIVE METABOLIC PANEL
ALT: 17 U/L (ref 14–54)
AST: 21 U/L (ref 15–41)
Albumin: 4.1 g/dL (ref 3.5–5.0)
Alkaline Phosphatase: 76 U/L (ref 38–126)
Anion gap: 12 (ref 5–15)
BUN: 16 mg/dL (ref 6–20)
CO2: 24 mmol/L (ref 22–32)
Calcium: 9.5 mg/dL (ref 8.9–10.3)
Chloride: 105 mmol/L (ref 101–111)
Creatinine, Ser: 0.86 mg/dL (ref 0.44–1.00)
GFR calc Af Amer: >60 mL/min (ref >60)
GFR calc non Af Amer: >60 mL/min (ref >60)
Glucose, Bld: 107 mg/dL — ABNORMAL HIGH (ref 65–99)
Potassium: 3.7 mmol/L (ref 3.5–5.1)
Sodium: 141 mmol/L (ref 135–145)
Total Bilirubin: 0.7 mg/dL (ref 0.3–1.2)
Total Protein: 7.4 g/dL (ref 6.5–8.1)

## 2016-06-09 LAB — CBC WITH DIFFERENTIAL/PLATELET
Basophils Absolute: 0 K/uL (ref 0.0–0.1)
Basophils Relative: 0 %
Eosinophils Absolute: 0.1 K/uL (ref 0.0–0.7)
Eosinophils Relative: 1 %
HCT: 50.7 % — ABNORMAL HIGH (ref 36.0–46.0)
Hemoglobin: 16.6 g/dL — ABNORMAL HIGH (ref 12.0–15.0)
Lymphocytes Relative: 34 %
Lymphs Abs: 2.8 K/uL (ref 0.7–4.0)
MCH: 29 pg (ref 26.0–34.0)
MCHC: 32.7 g/dL (ref 30.0–36.0)
MCV: 88.6 fL (ref 78.0–100.0)
Monocytes Absolute: 0.4 K/uL (ref 0.1–1.0)
Monocytes Relative: 4 %
Neutro Abs: 5.1 K/uL (ref 1.7–7.7)
Neutrophils Relative %: 61 %
Platelets: 194 K/uL (ref 150–400)
RBC: 5.72 MIL/uL — ABNORMAL HIGH (ref 3.87–5.11)
RDW: 13.3 % (ref 11.5–15.5)
WBC: 8.4 K/uL (ref 4.0–10.5)

## 2016-06-09 LAB — WET PREP, GENITAL
Clue Cells Wet Prep HPF POC: NONE SEEN
Sperm: NONE SEEN
Trich, Wet Prep: NONE SEEN
Yeast Wet Prep HPF POC: NONE SEEN

## 2016-06-09 LAB — URINALYSIS, ROUTINE W REFLEX MICROSCOPIC
Bilirubin Urine: NEGATIVE
Glucose, UA: NEGATIVE mg/dL
Hgb urine dipstick: NEGATIVE
Ketones, ur: 20 mg/dL — AB
Leukocytes, UA: NEGATIVE
Nitrite: NEGATIVE
Protein, ur: NEGATIVE mg/dL
Specific Gravity, Urine: 1.019 (ref 1.005–1.030)
pH: 6 (ref 5.0–8.0)

## 2016-06-09 MED ORDER — LORATADINE 10 MG PO TABS
10.0000 mg | ORAL_TABLET | Freq: Every day | ORAL | Status: DC
  Administered 2016-06-09: 10 mg via ORAL
  Filled 2016-06-09: qty 1

## 2016-06-09 MED ORDER — TRAZODONE HCL 50 MG PO TABS
50.0000 mg | ORAL_TABLET | Freq: Every day | ORAL | Status: DC
  Administered 2016-06-09: 50 mg via ORAL
  Filled 2016-06-09: qty 1

## 2016-06-09 MED ORDER — FLUTICASONE PROPIONATE 50 MCG/ACT NA SUSP: 2.0000 | Freq: Every day | NASAL | Status: DC | PRN

## 2016-06-09 MED ORDER — ALBUTEROL SULFATE HFA 108 (90 BASE) MCG/ACT IN AERS: 1.0000 | INHALATION_SPRAY | Freq: Four times a day (QID) | RESPIRATORY_TRACT | Status: DC | PRN

## 2016-06-09 MED ORDER — VITAMIN D 50 MCG (2000 UT) PO CAPS: 2000.0000 [IU] | ORAL_CAPSULE | Freq: Every day | ORAL | Status: DC

## 2016-06-09 MED ORDER — ASPIRIN EC 81 MG PO TBEC
81.0000 mg | DELAYED_RELEASE_TABLET | Freq: Every day | ORAL | Status: DC
  Administered 2016-06-09: 81 mg via ORAL
  Filled 2016-06-09: qty 1

## 2016-06-09 MED ORDER — FAMOTIDINE 20 MG PO TABS
10.0000 mg | ORAL_TABLET | Freq: Every day | ORAL | Status: DC
  Administered 2016-06-09: 10 mg via ORAL
  Filled 2016-06-09: qty 1

## 2016-06-09 MED ORDER — PRAVASTATIN SODIUM 40 MG PO TABS
40.0000 mg | ORAL_TABLET | Freq: Every day | ORAL | Status: DC
  Administered 2016-06-09: 40 mg via ORAL
  Filled 2016-06-09: qty 1

## 2016-06-09 MED ORDER — BISACODYL 5 MG PO TBEC: 10.0000 mg | DELAYED_RELEASE_TABLET | Freq: Every day | ORAL | Status: DC | PRN

## 2016-06-09 MED ORDER — TRAMADOL HCL 50 MG PO TABS
100.0000 mg | ORAL_TABLET | Freq: Three times a day (TID) | ORAL | Status: DC | PRN
  Administered 2016-06-09 – 2016-06-10 (×2): 100 mg via ORAL
  Filled 2016-06-09 (×2): qty 2

## 2016-06-09 MED ORDER — DOCUSATE SODIUM 100 MG PO CAPS
100.0000 mg | ORAL_CAPSULE | Freq: Every day | ORAL | Status: DC
  Administered 2016-06-09: 100 mg via ORAL
  Filled 2016-06-09: qty 1

## 2016-06-09 MED ORDER — BUPROPION HCL ER (XL) 150 MG PO TB24
300.0000 mg | ORAL_TABLET | Freq: Every day | ORAL | Status: DC
  Administered 2016-06-09: 300 mg via ORAL
  Filled 2016-06-09: qty 2

## 2016-06-09 MED ORDER — POLYVINYL ALCOHOL 1.4 % OP SOLN: 1.0000 [drp] | OPHTHALMIC | Status: DC | PRN

## 2016-06-09 MED ORDER — VERAPAMIL HCL ER 120 MG PO TBCR
120.0000 mg | EXTENDED_RELEASE_TABLET | Freq: Every day | ORAL | Status: DC
  Administered 2016-06-09: 120 mg via ORAL
  Filled 2016-06-09 (×2): qty 1

## 2016-06-09 MED ORDER — VITAMIN D 1000 UNITS PO TABS
2000.0000 [IU] | ORAL_TABLET | Freq: Every day | ORAL | Status: DC
  Administered 2016-06-09: 2000 [IU] via ORAL
  Filled 2016-06-09: qty 2
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
## 2016-06-09 NOTE — ED Notes (Signed)
Daughter Anderson Malta would like to be notified when the patient gets placed [phone]
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
## 2016-06-09 NOTE — ED Notes (Signed)
Pt is resting in bed, ambulated to the restroom with cane.  Refused snacks.

## 2016-06-09 NOTE — Progress Notes (Signed)
Pt. Assessed and inpatient treatment is recommended.  Referrals sent out to the following:  [facility],  [facility],  [facility],  [facility],  [facility],  [facility],  [facility],  [facility] T. Judi Cong, MSW, [place] Social Work Disposition [phone] [address]

## 2016-06-09 NOTE — ED Notes (Signed)
Pt given snack, sitting at bedside very quiet, dinner ordered. Sitter at bedside

## 2016-06-09 NOTE — ED Notes (Signed)
Pt eating dinner, sitter at bedside

## 2016-06-09 NOTE — ED Notes (Signed)
Patient transported to X-ray 
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
## 2016-06-09 NOTE — ED Notes (Signed)
Patient transported to CT 

## 2016-06-09 NOTE — ED Notes (Signed)
Pt received from [place] RN, ambulated to room 9 with ED tech, wanded by security and belongings locked in cabinet. Pt is calm and cooperative

## 2016-06-09 NOTE — ED Notes (Signed)
Family at bedside. 

## 2016-06-09 NOTE — ED Notes (Signed)
TTS at bedside. 

## 2016-06-10 DIAGNOSIS — E119 Type 2 diabetes mellitus without complications: Secondary | ICD-10-CM | POA: Diagnosis not present

## 2016-06-10 DIAGNOSIS — F333 Major depressive disorder, recurrent, severe with psychotic symptoms: Secondary | ICD-10-CM | POA: Diagnosis not present

## 2016-06-10 DIAGNOSIS — G43909 Migraine, unspecified, not intractable, without status migrainosus: Secondary | ICD-10-CM | POA: Diagnosis not present

## 2016-06-10 DIAGNOSIS — I10 Essential (primary) hypertension: Secondary | ICD-10-CM | POA: Diagnosis not present

## 2016-06-10 DIAGNOSIS — Z96652 Presence of left artificial knee joint: Secondary | ICD-10-CM | POA: Diagnosis not present

## 2016-06-10 DIAGNOSIS — Z7984 Long term (current) use of oral hypoglycemic drugs: Secondary | ICD-10-CM | POA: Diagnosis not present

## 2016-06-10 DIAGNOSIS — G8929 Other chronic pain: Secondary | ICD-10-CM | POA: Diagnosis not present

## 2016-06-10 DIAGNOSIS — Z7982 Long term (current) use of aspirin: Secondary | ICD-10-CM | POA: Diagnosis not present

## 2016-06-10 DIAGNOSIS — E1142 Type 2 diabetes mellitus with diabetic polyneuropathy: Secondary | ICD-10-CM | POA: Diagnosis not present

## 2016-06-10 DIAGNOSIS — K219 Gastro-esophageal reflux disease without esophagitis: Secondary | ICD-10-CM | POA: Diagnosis not present

## 2016-06-10 DIAGNOSIS — F209 Schizophrenia, unspecified: Secondary | ICD-10-CM | POA: Diagnosis not present

## 2016-06-10 DIAGNOSIS — Z91410 Personal history of adult physical and sexual abuse: Secondary | ICD-10-CM | POA: Diagnosis not present

## 2016-06-10 DIAGNOSIS — R102 Pelvic and perineal pain: Secondary | ICD-10-CM | POA: Diagnosis not present

## 2016-06-10 DIAGNOSIS — R443 Hallucinations, unspecified: Secondary | ICD-10-CM | POA: Diagnosis not present

## 2016-06-10 DIAGNOSIS — Z87891 Personal history of nicotine dependence: Secondary | ICD-10-CM | POA: Diagnosis not present

## 2016-06-10 DIAGNOSIS — E785 Hyperlipidemia, unspecified: Secondary | ICD-10-CM | POA: Diagnosis not present

## 2016-06-10 NOTE — ED Notes (Signed)
Pt Alert and oriented x4 pt VS stable.

## 2016-06-10 NOTE — BH Assessment (Signed)
[facility] Assessment Progress Note   Clinician received a call from [place] at [facility].  She said that patient had been accepted to [facility] by Dr. Kathlene Cote.  Patient will be going to the [facility] and can come after 09:00.  Nurse call report to [phone].    Kennyth Lose at [facility] asked if patient was going to be IVC'ed due to her psychosis.  Clinician said he would check.    Clinician spoke to [place], Therapist, sports and informed her of patient acceptance to [facility].  Patient is asleep now according to [facility].  We discussed patient maybe needing IVC due to her psychosis.  Patient will probably need to be seen by the EDP after shift change to determine if she needs to be IVC'ed.  Monique to also discuss w/ on-coming nurse at shift change.

## 2016-06-10 NOTE — ED Notes (Signed)
Pts daughter Anderson [place] notified of patients placement, was very grateful for the information

## 2016-06-10 NOTE — ED Notes (Signed)
Pt up eating breakfast - pt calm

## 2016-06-11 LAB — GC/CHLAMYDIA PROBE AMP (~~LOC~~) NOT AT ARMC
Chlamydia: NEGATIVE
Neisseria Gonorrhea: NEGATIVE

## 2016-06-24 DIAGNOSIS — F209 Schizophrenia, unspecified: Secondary | ICD-10-CM | POA: Diagnosis not present

## 2016-06-25 DIAGNOSIS — F209 Schizophrenia, unspecified: Secondary | ICD-10-CM | POA: Diagnosis not present

## 2016-06-30 DIAGNOSIS — G8929 Other chronic pain: Secondary | ICD-10-CM | POA: Diagnosis not present

## 2016-06-30 DIAGNOSIS — G5602 Carpal tunnel syndrome, left upper limb: Secondary | ICD-10-CM | POA: Diagnosis not present

## 2016-06-30 DIAGNOSIS — M533 Sacrococcygeal disorders, not elsewhere classified: Secondary | ICD-10-CM | POA: Diagnosis not present

## 2016-06-30 DIAGNOSIS — M79605 Pain in left leg: Secondary | ICD-10-CM | POA: Diagnosis not present

## 2016-06-30 DIAGNOSIS — M791 Myalgia: Secondary | ICD-10-CM | POA: Diagnosis not present

## 2016-06-30 DIAGNOSIS — M24542 Contracture, left hand: Secondary | ICD-10-CM | POA: Diagnosis not present

## 2016-06-30 DIAGNOSIS — M25562 Pain in left knee: Secondary | ICD-10-CM | POA: Diagnosis not present

## 2016-07-06 DIAGNOSIS — E119 Type 2 diabetes mellitus without complications: Secondary | ICD-10-CM | POA: Diagnosis not present

## 2016-07-06 DIAGNOSIS — I10 Essential (primary) hypertension: Secondary | ICD-10-CM | POA: Diagnosis not present

## 2016-07-07 ENCOUNTER — Other Ambulatory Visit: Payer: Self-pay | Admitting: Internal Medicine

## 2016-07-07 DIAGNOSIS — Z1231 Encounter for screening mammogram for malignant neoplasm of breast: Secondary | ICD-10-CM

## 2016-07-13 DIAGNOSIS — Z87898 Personal history of other specified conditions: Secondary | ICD-10-CM | POA: Diagnosis not present

## 2016-07-13 DIAGNOSIS — E119 Type 2 diabetes mellitus without complications: Secondary | ICD-10-CM | POA: Diagnosis not present

## 2016-07-13 DIAGNOSIS — I10 Essential (primary) hypertension: Secondary | ICD-10-CM | POA: Diagnosis not present

## 2016-07-13 DIAGNOSIS — Z1211 Encounter for screening for malignant neoplasm of colon: Secondary | ICD-10-CM | POA: Diagnosis not present

## 2016-07-13 DIAGNOSIS — N182 Chronic kidney disease, stage 2 (mild): Secondary | ICD-10-CM | POA: Diagnosis not present

## 2016-07-13 DIAGNOSIS — N183 Chronic kidney disease, stage 3 (moderate): Secondary | ICD-10-CM | POA: Diagnosis not present

## 2016-07-14 ENCOUNTER — Other Ambulatory Visit: Payer: Self-pay | Admitting: Internal Medicine

## 2016-07-14 DIAGNOSIS — N183 Chronic kidney disease, stage 3 unspecified: Secondary | ICD-10-CM

## 2016-07-14 DIAGNOSIS — I130 Hypertensive heart and chronic kidney disease with heart failure and stage 1 through stage 4 chronic kidney disease, or unspecified chronic kidney disease: Secondary | ICD-10-CM

## 2016-07-15 DIAGNOSIS — F209 Schizophrenia, unspecified: Secondary | ICD-10-CM | POA: Diagnosis not present

## 2016-07-16 ENCOUNTER — Ambulatory Visit
Admission: RE | Admit: 2016-07-16 | Discharge: 2016-07-16 | Disposition: A | Discharge status: Home / Self Care | Payer: Medicare HMO | Source: Ambulatory Visit | Attending: Internal Medicine | Admitting: Internal Medicine

## 2016-07-16 DIAGNOSIS — I130 Hypertensive heart and chronic kidney disease with heart failure and stage 1 through stage 4 chronic kidney disease, or unspecified chronic kidney disease: Secondary | ICD-10-CM

## 2016-07-16 DIAGNOSIS — N183 Chronic kidney disease, stage 3 unspecified: Secondary | ICD-10-CM

## 2016-07-19 ENCOUNTER — Other Ambulatory Visit: Payer: Self-pay

## 2016-07-20 ENCOUNTER — Ambulatory Visit
Admission: RE | Admit: 2016-07-20 | Discharge: 2016-07-20 | Disposition: A | Discharge status: Home / Self Care | Payer: Medicare HMO | Source: Ambulatory Visit | Attending: Internal Medicine | Admitting: Internal Medicine

## 2016-07-20 DIAGNOSIS — Z1231 Encounter for screening mammogram for malignant neoplasm of breast: Secondary | ICD-10-CM | POA: Diagnosis not present

## 2016-08-05 DIAGNOSIS — N183 Chronic kidney disease, stage 3 (moderate): Secondary | ICD-10-CM | POA: Diagnosis not present

## 2016-08-05 DIAGNOSIS — H9313 Tinnitus, bilateral: Secondary | ICD-10-CM | POA: Diagnosis not present

## 2016-08-06 DIAGNOSIS — F209 Schizophrenia, unspecified: Secondary | ICD-10-CM | POA: Diagnosis not present

## 2016-08-08 ENCOUNTER — Other Ambulatory Visit: Payer: Self-pay | Admitting: Family Medicine

## 2016-08-09 ENCOUNTER — Encounter: Payer: Self-pay | Admitting: Urology

## 2016-08-09 ENCOUNTER — Ambulatory Visit: Payer: Self-pay | Admitting: Urology

## 2016-08-17 NOTE — Telephone Encounter (Signed)
NA

## 2016-08-29 NOTE — Progress Notes (Deleted)
08/30/2016 3:57 PM   Heather Haley January 29, 1961 [id number]  Referring provider: Glendon Axe, MD [facility] [place], [place] [address]  No chief complaint on file.   HPI: Patient is a 56 year old Caucasian female who is referred to [place] by, Dr. Glendon Axe, for feelings of incomplete bladder emptying.  Patient states that she has had feelings of incomplete bladder emptying for ***.  She is having associated urinary frequency, urgency, dysuria, nocturia, intermittency, hesitancy, straining to urinate and weak urinary stream.   ***  She is not having associated urinary frequency, urgency, dysuria, nocturia, intermittency, hesitancy, straining to urinate and weak urinary stream.   ***  She does/does not have a history of urinary tract infections, STI's or injury to the bladder. ***  She denies/endorses dysuria, gross hematuria, suprapubic pain, back pain, abdominal pain or flank pain.***  She denies/endorses dysuria, gross hematuria, suprapubic pain, back pain, abdominal pain or flank pain.***  She has not had any recent fevers, chills, nausea or vomiting. ***  She has not had any recent fevers, chills, nausea or vomiting. ***  She does/does not have a history of nephrolithiasis, GU surgery or GU trauma. ***  She does/does not have a history of nephrolithiasis, GU surgery or GU trauma. ***  She is/is not sexually active.  She has/has not noted incontinence with sexual intercourse.  ***   She is post menopausal. ***  She admits to/denies constipation and/or diarrhea. ***  She is having/ not having pain with bladder filling.  ***  Renal ultrasound performed in June 2018 was normal.  Contrast CT in April 2017 was positive for constipation.  She is drinking *** of water daily.   She is drinking *** caffeinated beverages daily.  She is drinking *** alcoholic beverages daily.    PMH: Past Medical History:  Diagnosis Date  . Anxiety   .  Chronic pain    previously saw Dr. Consuela Mimes in pain clinic, then saw pain specialist in [place]  . Depression   . Diabetes mellitus ([place])   . Frequency of urination   . GERD (gastroesophageal reflux disease)   . Headache(784.0)   . High cholesterol   . Hypertension   . IBS (irritable bowel syndrome)   . Left ankle instability   . Left knee DJD   . Lumbar Degenerative Disc Disease of  10/11/2014  . Neuromuscular disorder ([place])   . Osteoarthritis of hip (Right) 05/05/2015  . Other enthesopathy of ankle and tarsus 12/15/2009   Qualifier: Diagnosis of  By: Oneida Alar MD, KARL    . Peripheral sensory neuropathy (Bilateral) 11/19/2014  . Postoperative nausea and vomiting     Surgical History: Past Surgical History:  Procedure Laterality Date  . ABDOMINAL HYSTERECTOMY    . ANKLE SURGERY    . APPENDECTOMY    . COLONOSCOPY  2013  . ESOPHAGOGASTRODUODENOSCOPY (EGD) WITH PROPOFOL N/A 10/03/2014   Procedure: ESOPHAGOGASTRODUODENOSCOPY (EGD) WITH PROPOFOL;  Surgeon: Josefine Class, MD;  Location: [facility] ENDOSCOPY;  Service: Endoscopy;  Laterality: N/A;  . KNEE ARTHROSCOPY  1997   left knee  . LEFT HEART CATHETERIZATION WITH CORONARY ANGIOGRAM N/A 01/11/2013   Procedure: LEFT HEART CATHETERIZATION WITH CORONARY ANGIOGRAM;  Surgeon: Sinclair Grooms, MD;  Location[facility] CATH LAB;  Service: Cardiovascular;  Laterality: N/A;  . TOTAL KNEE ARTHROPLASTY  08/30/2011   Procedure: TOTAL KNEE ARTHROPLASTY;  Surgeon: Lorn Junes, MD;  Location: [facility];  Service: Orthopedics;  Laterality: Left;  Home Medications:  Allergies as of 08/30/2016      Reactions   Buprenorphine Hcl Other (See Comments)   Unable to void   Codeine Hives   REACTION: hives   Cymbalta [duloxetine Hcl] Other (See Comments)   Altered mental status   Duloxetine Other (See Comments)   Altered mental status Alopecia, visual hallucinations, nightmares   Gabapentin Swelling   Lyrica [pregabalin] Other (See Comments)   Alopecia,  visual hallucinations, nightmares Altered mental status Alopecia, visual hallucinations, nightmares Altered mental status   Morphine And Related Other (See Comments)   Unable to void   Nortripytline Hcl [nortriptyline] Other (See Comments)   Hair loss and night mares Hair loss and night mares   Nsaids    REACTION: palpitations, diaphoresis   Penicillin G Nausea And Vomiting   Penicillins    REACTION: upset stomach   Tolmetin Other (See Comments)   REACTION: palpitations, diaphoresis   Wellbutrin [bupropion]    Constipation, mood swings      Medication List       Accurate as of 08/29/16  3:57 PM. Always use your most recent med list.          albuterol 108 (90 Base) MCG/ACT inhaler Commonly known as:  PROVENTIL HFA;VENTOLIN HFA Inhale into the lungs.   ALPRAZolam 0.25 MG tablet Commonly known as:  XANAX Take 1 tablet (0.25 mg total) by mouth daily as needed for anxiety.   ARTIFICIAL TEARS OP Apply 2 drops to eye daily as needed (for dry eyes). Dry eye   aspirin EC 81 MG tablet Take 81 mg by mouth daily.   bisacodyl 5 MG EC tablet Commonly known as:  DULCOLAX Take 2 tablets (10 mg total) by mouth daily as needed for moderate constipation.   buPROPion 300 MG 24 hr tablet Commonly known as:  WELLBUTRIN XL TAKE 1 TABLET BY MOUTH EVERY DAY   cetirizine 10 MG tablet Commonly known as:  ZYRTEC Take 1 tablet (10 mg total) by mouth at bedtime.   clindamycin 300 MG capsule Commonly known as:  CLEOCIN TK 2 CS PO 1 HOUR PRIOR TO DENTAL WORK   diclofenac sodium 1 % Gel Commonly known as:  VOLTAREN Apply 2 g topically 4 (four) times daily. To hand   docusate sodium 100 MG capsule Commonly known as:  COLACE Take 100 mg by mouth daily.   fluticasone 50 MCG/ACT nasal spray Commonly known as:  FLONASE Place 2 sprays into both nostrils daily as needed for allergies.   HYDROcodone-acetaminophen 5-325 MG tablet Commonly known as:  NORCO/VICODIN Take 1 tablet by mouth  daily as needed for severe pain.   HYDROcodone-acetaminophen 5-325 MG tablet Commonly known as:  NORCO/VICODIN Take 1 tablet by mouth daily as needed for severe pain.   lactulose 10 GM/15ML solution Commonly known as:  CHRONULAC Take 10 g by mouth 2 (two) times daily as needed for mild constipation.   metFORMIN 500 MG tablet Commonly known as:  GLUCOPHAGE Take 1 tablet (500 mg total) by mouth 2 (two) times daily with a meal.   omeprazole 40 MG capsule Commonly known as:  PRILOSEC Take 1 capsule (40 mg total) by mouth daily as needed (indigestion).   polyethylene glycol powder powder Commonly known as:  GLYCOLAX/MIRALAX Take 255 g by mouth once.   pravastatin 40 MG tablet Commonly known as:  PRAVACHOL Take 40 mg by mouth at bedtime.   ranitidine 150 MG capsule Commonly known as:  ZANTAC Take 150 mg by mouth as needed  for heartburn.   rizatriptan 10 MG tablet Commonly known as:  MAXALT TK 1 T PO PRN FOR UP TO 30 DAYS FOR MIGRAINE. MAY REPEAT IN 2 H IF NEEDED.   SUMAtriptan 100 MG tablet Commonly known as:  IMITREX Take 1 tab at onset of headache, may take second dose after 1 hour. Do not take more than 2 a week   topiramate 100 MG tablet Commonly known as:  TOPAMAX Take 2 tablets (200 mg total) by mouth 2 (two) times daily.   traMADol 50 MG tablet Commonly known as:  ULTRAM Take 2 tablets (100 mg total) by mouth every 8 (eight) hours as needed for moderate pain or severe pain.   traZODone 50 MG tablet Commonly known as:  DESYREL Take 50 mg by mouth at bedtime.   verapamil 120 MG CR tablet Commonly known as:  CALAN-SR Take 1 tablet (120 mg total) by mouth at bedtime.   Vitamin D 2000 units Caps Take 2,000 Units by mouth daily.       Allergies:  Allergies  Allergen Reactions  . Buprenorphine Hcl Other (See Comments)    Unable to void  . Codeine Hives    REACTION: hives  . Cymbalta [Duloxetine Hcl] Other (See Comments)    Altered mental status  .  Duloxetine Other (See Comments)    Altered mental status Alopecia, visual hallucinations, nightmares  . Gabapentin Swelling  . Lyrica [Pregabalin] Other (See Comments)    Alopecia, visual hallucinations, nightmares Altered mental status Alopecia, visual hallucinations, nightmares Altered mental status  . Morphine And Related Other (See Comments)    Unable to void  . Nortripytline Hcl [Nortriptyline] Other (See Comments)    Hair loss and night mares Hair loss and night mares  . Nsaids     REACTION: palpitations, diaphoresis  . Penicillin G Nausea And Vomiting  . Penicillins     REACTION: upset stomach  . Tolmetin Other (See Comments)    REACTION: palpitations, diaphoresis  . Wellbutrin [Bupropion]     Constipation, mood swings    Family History: Family History  Problem Relation Age of Onset  . Cancer Mother   . Hypertension Father   . Heart disease Father     Social History:  reports that she quit smoking about 21 years ago. Her smoking use included Cigarettes. She has a 30.00 pack-year smoking history. She has never used smokeless tobacco. She reports that she does not drink alcohol or use drugs.  ROS:                                        Physical Exam: There were no vitals taken for this visit.  Constitutional: Well nourished. Alert and oriented, No acute distress. HEENT: [place] AT, moist mucus membranes. Trachea midline, no masses. Cardiovascular: No clubbing, cyanosis, or edema. Respiratory: Normal respiratory effort, no increased work of breathing. GI: Abdomen is soft, non tender, non distended, no abdominal masses. Liver and spleen not palpable.  No hernias appreciated.  Stool sample for occult testing is not indicated.   GU: No CVA tenderness.  No bladder fullness or masses.  Normal external genitalia, normal pubic hair distribution, no lesions.  Normal urethral meatus, no lesions, no prolapse, no discharge.   No urethral masses, tenderness  and/or tenderness. No bladder fullness, tenderness or masses. Normal vagina mucosa, good estrogen effect, no discharge, no lesions, good pelvic support, no cystocele  or rectocele noted.  Cervix and uterus are surgically absent.  No adnexal/parametria masses or tenderness noted.  Anus and perineum are without rashes or lesions.   *** Skin: No rashes, bruises or suspicious lesions. Lymph: No cervical or inguinal adenopathy. Neurologic: Grossly intact, no focal deficits, moving all 4 extremities. Psychiatric: Normal mood and affect.  Laboratory Data: Lab Results  Component Value Date   WBC 8.4 06/09/2016   HGB 16.6 (H) 06/09/2016   HCT 50.7 (H) 06/09/2016   MCV 88.6 06/09/2016   PLT 194 06/09/2016    Lab Results  Component Value Date   CREATININE 0.86 06/09/2016     Lab Results  Component Value Date   AST 21 06/09/2016   Lab Results  Component Value Date   ALT 17 06/09/2016    I have independently reviewed the labs.  Urinalysis ***  Pertinent Imaging: CLINICAL DATA:  Stage 3 chronic kidney disease.  EXAM: RENAL / URINARY TRACT ULTRASOUND COMPLETE  COMPARISON:  Right upper quadrant abdomen ultrasound dated 06/07/2016 and abdomen and pelvis CT dated 02/23/2016.  FINDINGS: Right Kidney:  Length: 10.8 cm. Echogenicity within normal limits. No mass or hydronephrosis visualized.  Left Kidney:  Length: 10.7 cm. Echogenicity within normal limits. No mass or hydronephrosis visualized.  Bladder:  Appears normal for degree of bladder distention.  IMPRESSION: Normal examination.   Electronically Signed   By: Claudie Revering M.D.   On: 07/16/2016 11:15  CLINICAL DATA:  Chest pain.  Constipation.  EXAM: CT ANGIOGRAPHY CHEST  CT ABDOMEN AND PELVIS WITH CONTRAST  TECHNIQUE: Multidetector CT imaging of the chest was performed using the standard protocol during bolus administration of intravenous contrast. Multiplanar CT image reconstructions and  MIPs were obtained to evaluate the vascular anatomy. Multidetector CT imaging of the abdomen and pelvis was performed using the standard protocol during bolus administration of intravenous contrast.  CONTRAST:  129mL OMNIPAQUE IOHEXOL 350 MG/ML SOLN  COMPARISON:  10/16/2014  FINDINGS: CTA CHEST FINDINGS  Cardiovascular: There is good opacification of the pulmonary arteries. There is no pulmonary embolism. The thoracic aorta is normal in caliber and intact.  Lungs: Clear  Central airways: Patent  Effusions: None  Lymphadenopathy: None  Esophagus: Unremarkable  Musculoskeletal: No significant abnormality  CT ABDOMEN and PELVIS FINDINGS  Hepatobiliary: There are normal appearances of the liver, gallbladder and bile ducts.  Pancreas: Normal  Spleen: Normal  Adrenals/Urinary Tract: The adrenals and kidneys are normal in appearance. There is no urinary calculus evident. There is no hydronephrosis or ureteral dilatation. Collecting systems and ureters appear unremarkable.  Stomach/Bowel: The stomach and small bowel are normal in appearance. Prior appendectomy. The colon is distended with a large volume of stool but is otherwise unremarkable.  Vascular/Lymphatic: The abdominal aorta is normal in caliber. There is mild atherosclerotic calcification. There is no adenopathy in the abdomen or pelvis.  Reproductive: Hysterectomy.  No adnexal abnormalities.  Other: No acute inflammatory changes are evident in the abdomen or pelvis. There is no ascites.  Musculoskeletal: No significant abnormality  Review of the MIP images confirms the above findings.  IMPRESSION: No acute findings are evident in the chest, abdomen or pelvis. A large volume of stool distends the colon, consistent with constipation.   Electronically Signed   By: Andreas Newport M.D.   On: 05/17/2015 04:17  I have independently reviewed the films.    Assessment & Plan:   ***  1. Feelings of incomplete bladder emptying  -     No Follow-up on file.  These notes generated with voice recognition software. I apologize for typographical errors.  Zara Council, [facility] [address], [place], [place] [address] [phone]

## 2016-08-30 ENCOUNTER — Ambulatory Visit: Payer: Self-pay | Admitting: Urology

## 2016-09-07 ENCOUNTER — Other Ambulatory Visit: Payer: Self-pay | Admitting: Otolaryngology

## 2016-09-07 DIAGNOSIS — R490 Dysphonia: Secondary | ICD-10-CM

## 2016-09-07 DIAGNOSIS — H9312 Tinnitus, left ear: Secondary | ICD-10-CM

## 2016-09-07 DIAGNOSIS — H9203 Otalgia, bilateral: Secondary | ICD-10-CM

## 2016-09-07 DIAGNOSIS — H903 Sensorineural hearing loss, bilateral: Secondary | ICD-10-CM | POA: Diagnosis not present

## 2016-09-07 DIAGNOSIS — M542 Cervicalgia: Secondary | ICD-10-CM

## 2016-09-22 ENCOUNTER — Ambulatory Visit
Admission: RE | Admit: 2016-09-22 | Discharge: 2016-09-22 | Disposition: A | Discharge status: Home / Self Care | Payer: Medicare HMO | Source: Ambulatory Visit | Attending: Otolaryngology | Admitting: Otolaryngology

## 2016-09-22 DIAGNOSIS — R490 Dysphonia: Secondary | ICD-10-CM

## 2016-09-22 DIAGNOSIS — H9312 Tinnitus, left ear: Secondary | ICD-10-CM

## 2016-09-22 DIAGNOSIS — M542 Cervicalgia: Secondary | ICD-10-CM

## 2016-09-22 DIAGNOSIS — H9203 Otalgia, bilateral: Secondary | ICD-10-CM | POA: Diagnosis not present

## 2016-09-22 LAB — POCT I-STAT CREATININE: CREATININE: 0.9 mg/dL (ref 0.44–1.00)

## 2016-09-22 MED ORDER — IOPAMIDOL (ISOVUE-300) INJECTION 61%
75.0000 mL | Freq: Once | INTRAVENOUS | Status: AC | PRN
  Administered 2016-09-22: 75 mL via INTRAVENOUS

## 2016-10-05 DIAGNOSIS — G4440 Drug-induced headache, not elsewhere classified, not intractable: Secondary | ICD-10-CM | POA: Diagnosis not present

## 2016-10-05 DIAGNOSIS — M5481 Occipital neuralgia: Secondary | ICD-10-CM | POA: Diagnosis not present

## 2016-10-06 DIAGNOSIS — J181 Lobar pneumonia, unspecified organism: Secondary | ICD-10-CM | POA: Diagnosis not present

## 2016-10-14 DIAGNOSIS — M62838 Other muscle spasm: Secondary | ICD-10-CM | POA: Insufficient documentation

## 2016-10-14 DIAGNOSIS — E119 Type 2 diabetes mellitus without complications: Secondary | ICD-10-CM | POA: Diagnosis not present

## 2016-10-14 DIAGNOSIS — R635 Abnormal weight gain: Secondary | ICD-10-CM | POA: Diagnosis not present

## 2016-10-14 DIAGNOSIS — Z1211 Encounter for screening for malignant neoplasm of colon: Secondary | ICD-10-CM | POA: Diagnosis not present

## 2016-10-14 DIAGNOSIS — Z0000 Encounter for general adult medical examination without abnormal findings: Secondary | ICD-10-CM | POA: Diagnosis not present

## 2016-10-14 DIAGNOSIS — N182 Chronic kidney disease, stage 2 (mild): Secondary | ICD-10-CM | POA: Insufficient documentation

## 2016-10-14 DIAGNOSIS — Z780 Asymptomatic menopausal state: Secondary | ICD-10-CM | POA: Diagnosis not present

## 2016-10-14 DIAGNOSIS — I10 Essential (primary) hypertension: Secondary | ICD-10-CM | POA: Diagnosis not present

## 2016-10-15 DIAGNOSIS — Z780 Asymptomatic menopausal state: Secondary | ICD-10-CM | POA: Insufficient documentation

## 2016-11-03 DIAGNOSIS — M5481 Occipital neuralgia: Secondary | ICD-10-CM | POA: Diagnosis not present

## 2016-11-03 DIAGNOSIS — M542 Cervicalgia: Secondary | ICD-10-CM | POA: Diagnosis not present

## 2016-11-03 DIAGNOSIS — M791 Myalgia: Secondary | ICD-10-CM | POA: Diagnosis not present

## 2016-11-11 ENCOUNTER — Other Ambulatory Visit: Payer: Self-pay | Admitting: Family Medicine

## 2016-11-23 DIAGNOSIS — N760 Acute vaginitis: Secondary | ICD-10-CM | POA: Diagnosis not present

## 2016-11-23 DIAGNOSIS — M7989 Other specified soft tissue disorders: Secondary | ICD-10-CM | POA: Diagnosis not present

## 2016-11-23 DIAGNOSIS — R300 Dysuria: Secondary | ICD-10-CM | POA: Diagnosis not present

## 2017-01-05 DIAGNOSIS — G43719 Chronic migraine without aura, intractable, without status migrainosus: Secondary | ICD-10-CM | POA: Diagnosis not present

## 2017-01-05 DIAGNOSIS — R4789 Other speech disturbances: Secondary | ICD-10-CM | POA: Diagnosis not present

## 2017-01-05 DIAGNOSIS — G4440 Drug-induced headache, not elsewhere classified, not intractable: Secondary | ICD-10-CM | POA: Diagnosis not present

## 2017-01-05 DIAGNOSIS — M5481 Occipital neuralgia: Secondary | ICD-10-CM | POA: Diagnosis not present

## 2017-01-05 DIAGNOSIS — R413 Other amnesia: Secondary | ICD-10-CM | POA: Diagnosis not present

## 2017-01-10 DIAGNOSIS — R8290 Unspecified abnormal findings in urine: Secondary | ICD-10-CM | POA: Diagnosis not present

## 2017-01-10 DIAGNOSIS — E119 Type 2 diabetes mellitus without complications: Secondary | ICD-10-CM | POA: Diagnosis not present

## 2017-01-10 DIAGNOSIS — Z0000 Encounter for general adult medical examination without abnormal findings: Secondary | ICD-10-CM | POA: Diagnosis not present

## 2017-01-10 DIAGNOSIS — N760 Acute vaginitis: Secondary | ICD-10-CM | POA: Diagnosis not present

## 2017-01-10 DIAGNOSIS — I10 Essential (primary) hypertension: Secondary | ICD-10-CM | POA: Diagnosis not present

## 2017-01-20 DIAGNOSIS — G43719 Chronic migraine without aura, intractable, without status migrainosus: Secondary | ICD-10-CM | POA: Diagnosis not present

## 2017-01-20 DIAGNOSIS — M5481 Occipital neuralgia: Secondary | ICD-10-CM | POA: Diagnosis not present

## 2017-01-20 DIAGNOSIS — M542 Cervicalgia: Secondary | ICD-10-CM | POA: Diagnosis not present

## 2017-02-17 DIAGNOSIS — R05 Cough: Secondary | ICD-10-CM | POA: Diagnosis not present

## 2017-02-17 DIAGNOSIS — R079 Chest pain, unspecified: Secondary | ICD-10-CM | POA: Diagnosis not present

## 2017-02-17 DIAGNOSIS — R509 Fever, unspecified: Secondary | ICD-10-CM | POA: Diagnosis not present

## 2017-02-17 DIAGNOSIS — R0781 Pleurodynia: Secondary | ICD-10-CM | POA: Diagnosis not present

## 2017-02-18 DIAGNOSIS — K2950 Unspecified chronic gastritis without bleeding: Secondary | ICD-10-CM | POA: Diagnosis not present

## 2017-02-18 DIAGNOSIS — K5900 Constipation, unspecified: Secondary | ICD-10-CM | POA: Diagnosis not present

## 2017-02-18 DIAGNOSIS — Z8371 Family history of colonic polyps: Secondary | ICD-10-CM | POA: Diagnosis not present

## 2017-02-18 DIAGNOSIS — Z800 Family history of malignant neoplasm of digestive organs: Secondary | ICD-10-CM | POA: Diagnosis not present

## 2017-02-18 DIAGNOSIS — K5909 Other constipation: Secondary | ICD-10-CM | POA: Diagnosis not present

## 2017-02-18 DIAGNOSIS — Z86010 Personal history of colonic polyps: Secondary | ICD-10-CM | POA: Diagnosis not present

## 2017-03-02 ENCOUNTER — Emergency Department (HOSPITAL_COMMUNITY)
Admission: EM | Admit: 2017-03-02 | Discharge: 2017-03-03 | Disposition: A | Discharge status: Other Acute Inpatient Hospital | Payer: Medicare HMO | Attending: Emergency Medicine | Admitting: Emergency Medicine

## 2017-03-02 ENCOUNTER — Encounter (HOSPITAL_COMMUNITY): Payer: Self-pay | Admitting: Emergency Medicine

## 2017-03-02 ENCOUNTER — Other Ambulatory Visit: Payer: Self-pay

## 2017-03-02 DIAGNOSIS — F22 Delusional disorders: Secondary | ICD-10-CM

## 2017-03-02 DIAGNOSIS — I10 Essential (primary) hypertension: Secondary | ICD-10-CM | POA: Diagnosis not present

## 2017-03-02 DIAGNOSIS — R52 Pain, unspecified: Secondary | ICD-10-CM | POA: Diagnosis present

## 2017-03-02 DIAGNOSIS — F29 Unspecified psychosis not due to a substance or known physiological condition: Secondary | ICD-10-CM | POA: Insufficient documentation

## 2017-03-02 DIAGNOSIS — Z79899 Other long term (current) drug therapy: Secondary | ICD-10-CM | POA: Insufficient documentation

## 2017-03-02 DIAGNOSIS — Z87891 Personal history of nicotine dependence: Secondary | ICD-10-CM | POA: Insufficient documentation

## 2017-03-02 DIAGNOSIS — E1140 Type 2 diabetes mellitus with diabetic neuropathy, unspecified: Secondary | ICD-10-CM | POA: Diagnosis not present

## 2017-03-02 LAB — URINALYSIS, ROUTINE W REFLEX MICROSCOPIC
BILIRUBIN URINE: NEGATIVE
Glucose, UA: NEGATIVE mg/dL
Ketones, ur: NEGATIVE mg/dL
Leukocytes, UA: NEGATIVE
Nitrite: NEGATIVE
PH: 5 (ref 5.0–8.0)
Protein, ur: NEGATIVE mg/dL
RBC / HPF: NONE SEEN RBC/hpf (ref 0–5)
SPECIFIC GRAVITY, URINE: 1.008 (ref 1.005–1.030)

## 2017-03-02 LAB — RAPID URINE DRUG SCREEN, HOSP PERFORMED
Amphetamines: NOT DETECTED
BARBITURATES: NOT DETECTED
Benzodiazepines: NOT DETECTED
COCAINE: NOT DETECTED
Opiates: POSITIVE — AB
TETRAHYDROCANNABINOL: NOT DETECTED

## 2017-03-02 LAB — COMPREHENSIVE METABOLIC PANEL
ALBUMIN: 4.7 g/dL (ref 3.5–5.0)
ALK PHOS: 114 U/L (ref 38–126)
ALT: 24 U/L (ref 14–54)
ANION GAP: 15 (ref 5–15)
AST: 27 U/L (ref 15–41)
BILIRUBIN TOTAL: 0.8 mg/dL (ref 0.3–1.2)
BUN: 9 mg/dL (ref 6–20)
CALCIUM: 10 mg/dL (ref 8.9–10.3)
CO2: 20 mmol/L — AB (ref 22–32)
CREATININE: 1.18 mg/dL — AB (ref 0.44–1.00)
Chloride: 104 mmol/L (ref 101–111)
GFR calc Af Amer: 58 mL/min — ABNORMAL LOW (ref >60)
GFR calc non Af Amer: 50 mL/min — ABNORMAL LOW (ref >60)
GLUCOSE: 136 mg/dL — AB (ref 65–99)
Potassium: 3.5 mmol/L (ref 3.5–5.1)
Sodium: 139 mmol/L (ref 135–145)
Total Protein: 8.3 g/dL — ABNORMAL HIGH (ref 6.5–8.1)

## 2017-03-02 LAB — CBC
HEMATOCRIT: 51.4 % — AB (ref 36.0–46.0)
HEMOGLOBIN: 17.4 g/dL — AB (ref 12.0–15.0)
MCH: 30.3 pg (ref 26.0–34.0)
MCHC: 33.9 g/dL (ref 30.0–36.0)
MCV: 89.5 fL (ref 78.0–100.0)
Platelets: 300 10*3/uL (ref 150–400)
RBC: 5.74 MIL/uL — ABNORMAL HIGH (ref 3.87–5.11)
RDW: 13.2 % (ref 11.5–15.5)
WBC: 13 10*3/uL — ABNORMAL HIGH (ref 4.0–10.5)

## 2017-03-02 LAB — ETHANOL: Alcohol, Ethyl (B): <10 mg/dL (ref <10)

## 2017-03-02 LAB — ACETAMINOPHEN LEVEL: Acetaminophen (Tylenol), Serum: <10 ug/mL — ABNORMAL LOW (ref 10–30)

## 2017-03-02 LAB — SALICYLATE LEVEL: Salicylate Lvl: <7 mg/dL (ref 2.8–30.0)

## 2017-03-02 MED ORDER — ALBUTEROL SULFATE HFA 108 (90 BASE) MCG/ACT IN AERS: 2.0000 | INHALATION_SPRAY | Freq: Four times a day (QID) | RESPIRATORY_TRACT | Status: DC | PRN

## 2017-03-02 MED ORDER — ALUM & MAG HYDROXIDE-SIMETH 200-200-20 MG/5ML PO SUSP: 30.0000 mL | Freq: Four times a day (QID) | ORAL | Status: DC | PRN

## 2017-03-02 MED ORDER — VERAPAMIL HCL ER 120 MG PO TBCR
120.0000 mg | EXTENDED_RELEASE_TABLET | Freq: Every day | ORAL | Status: DC
  Administered 2017-03-03: 120 mg via ORAL
  Filled 2017-03-02: qty 1

## 2017-03-02 MED ORDER — DICLOFENAC SODIUM 1 % TD GEL: 2.0000 g | Freq: Four times a day (QID) | TRANSDERMAL | Status: DC | PRN

## 2017-03-02 MED ORDER — DOCUSATE SODIUM 100 MG PO CAPS
100.0000 mg | ORAL_CAPSULE | Freq: Every day | ORAL | Status: DC
  Administered 2017-03-03: 100 mg via ORAL
  Filled 2017-03-02: qty 1

## 2017-03-02 MED ORDER — TRAZODONE HCL 50 MG PO TABS
50.0000 mg | ORAL_TABLET | Freq: Every day | ORAL | Status: DC
  Administered 2017-03-03: 50 mg via ORAL
  Filled 2017-03-02: qty 1

## 2017-03-02 MED ORDER — HYDROCODONE-ACETAMINOPHEN 5-325 MG PO TABS
1.0000 | ORAL_TABLET | Freq: Every day | ORAL | Status: DC | PRN
  Administered 2017-03-03 (×2): 1 via ORAL
  Filled 2017-03-02 (×2): qty 1

## 2017-03-02 MED ORDER — BUPROPION HCL ER (XL) 150 MG PO TB24
300.0000 mg | ORAL_TABLET | Freq: Every day | ORAL | Status: DC
  Administered 2017-03-03: 300 mg via ORAL
  Filled 2017-03-02: qty 2

## 2017-03-02 MED ORDER — METFORMIN HCL 500 MG PO TABS
500.0000 mg | ORAL_TABLET | Freq: Two times a day (BID) | ORAL | Status: DC
  Administered 2017-03-03: 500 mg via ORAL
  Filled 2017-03-02: qty 1

## 2017-03-02 MED ORDER — ACETAMINOPHEN 325 MG PO TABS: 650.0000 mg | ORAL_TABLET | ORAL | Status: DC | PRN

## 2017-03-02 NOTE — ED Notes (Signed)
Lab work, radiology results and vital signs reviewed, no critical results at this time, no change in acuity indicated.  
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
## 2017-03-02 NOTE — ED Notes (Signed)
Pt remains in waiting room. Updated on wait for treatment room. 

## 2017-03-02 NOTE — ED Notes (Signed)
Asked pt about a urine sample.  Pt stated that she had just voided.  Pt is aware to notify staff if she needs to void again so we can collect a sample
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
## 2017-03-03 ENCOUNTER — Inpatient Hospital Stay (HOSPITAL_COMMUNITY)
Admission: AD | Admit: 2017-03-03 | Discharge: 2017-03-10 | DRG: 885 | Disposition: A | Discharge status: Home / Self Care | Payer: Medicare HMO | Source: Intra-hospital | Attending: Psychiatry | Admitting: Psychiatry

## 2017-03-03 ENCOUNTER — Other Ambulatory Visit: Payer: Self-pay

## 2017-03-03 ENCOUNTER — Encounter (HOSPITAL_COMMUNITY): Payer: Self-pay

## 2017-03-03 DIAGNOSIS — F209 Schizophrenia, unspecified: Secondary | ICD-10-CM | POA: Diagnosis not present

## 2017-03-03 DIAGNOSIS — G43909 Migraine, unspecified, not intractable, without status migrainosus: Secondary | ICD-10-CM | POA: Diagnosis present

## 2017-03-03 DIAGNOSIS — F39 Unspecified mood [affective] disorder: Secondary | ICD-10-CM | POA: Diagnosis not present

## 2017-03-03 DIAGNOSIS — Z880 Allergy status to penicillin: Secondary | ICD-10-CM | POA: Diagnosis not present

## 2017-03-03 DIAGNOSIS — Z886 Allergy status to analgesic agent status: Secondary | ICD-10-CM | POA: Diagnosis not present

## 2017-03-03 DIAGNOSIS — I10 Essential (primary) hypertension: Secondary | ICD-10-CM | POA: Diagnosis not present

## 2017-03-03 DIAGNOSIS — Z87891 Personal history of nicotine dependence: Secondary | ICD-10-CM | POA: Diagnosis not present

## 2017-03-03 DIAGNOSIS — G8929 Other chronic pain: Secondary | ICD-10-CM

## 2017-03-03 DIAGNOSIS — Z6837 Body mass index (BMI) 37.0-37.9, adult: Secondary | ICD-10-CM | POA: Diagnosis not present

## 2017-03-03 DIAGNOSIS — Z809 Family history of malignant neoplasm, unspecified: Secondary | ICD-10-CM | POA: Diagnosis not present

## 2017-03-03 DIAGNOSIS — Z7982 Long term (current) use of aspirin: Secondary | ICD-10-CM | POA: Diagnosis not present

## 2017-03-03 DIAGNOSIS — Z888 Allergy status to other drugs, medicaments and biological substances status: Secondary | ICD-10-CM

## 2017-03-03 DIAGNOSIS — R450 Nervousness: Secondary | ICD-10-CM | POA: Diagnosis not present

## 2017-03-03 DIAGNOSIS — G4700 Insomnia, unspecified: Secondary | ICD-10-CM | POA: Diagnosis present

## 2017-03-03 DIAGNOSIS — Z90710 Acquired absence of both cervix and uterus: Secondary | ICD-10-CM

## 2017-03-03 DIAGNOSIS — G894 Chronic pain syndrome: Secondary | ICD-10-CM | POA: Diagnosis not present

## 2017-03-03 DIAGNOSIS — M549 Dorsalgia, unspecified: Secondary | ICD-10-CM | POA: Diagnosis not present

## 2017-03-03 DIAGNOSIS — M2550 Pain in unspecified joint: Secondary | ICD-10-CM | POA: Diagnosis not present

## 2017-03-03 DIAGNOSIS — Z8249 Family history of ischemic heart disease and other diseases of the circulatory system: Secondary | ICD-10-CM | POA: Diagnosis not present

## 2017-03-03 DIAGNOSIS — Z7984 Long term (current) use of oral hypoglycemic drugs: Secondary | ICD-10-CM

## 2017-03-03 DIAGNOSIS — F419 Anxiety disorder, unspecified: Secondary | ICD-10-CM | POA: Diagnosis not present

## 2017-03-03 DIAGNOSIS — Z96652 Presence of left artificial knee joint: Secondary | ICD-10-CM | POA: Diagnosis not present

## 2017-03-03 DIAGNOSIS — G608 Other hereditary and idiopathic neuropathies: Secondary | ICD-10-CM | POA: Diagnosis not present

## 2017-03-03 DIAGNOSIS — E1142 Type 2 diabetes mellitus with diabetic polyneuropathy: Secondary | ICD-10-CM | POA: Diagnosis not present

## 2017-03-03 DIAGNOSIS — Z885 Allergy status to narcotic agent status: Secondary | ICD-10-CM

## 2017-03-03 DIAGNOSIS — E1140 Type 2 diabetes mellitus with diabetic neuropathy, unspecified: Secondary | ICD-10-CM | POA: Diagnosis not present

## 2017-03-03 DIAGNOSIS — E7800 Pure hypercholesterolemia, unspecified: Secondary | ICD-10-CM | POA: Diagnosis not present

## 2017-03-03 DIAGNOSIS — K219 Gastro-esophageal reflux disease without esophagitis: Secondary | ICD-10-CM | POA: Diagnosis present

## 2017-03-03 DIAGNOSIS — F29 Unspecified psychosis not due to a substance or known physiological condition: Secondary | ICD-10-CM | POA: Diagnosis not present

## 2017-03-03 DIAGNOSIS — R440 Auditory hallucinations: Secondary | ICD-10-CM | POA: Diagnosis not present

## 2017-03-03 DIAGNOSIS — R443 Hallucinations, unspecified: Secondary | ICD-10-CM | POA: Diagnosis not present

## 2017-03-03 DIAGNOSIS — Z79899 Other long term (current) drug therapy: Secondary | ICD-10-CM | POA: Diagnosis not present

## 2017-03-03 DIAGNOSIS — F333 Major depressive disorder, recurrent, severe with psychotic symptoms: Secondary | ICD-10-CM | POA: Diagnosis not present

## 2017-03-03 LAB — CBG MONITORING, ED: GLUCOSE-CAPILLARY: 220 mg/dL — AB (ref 65–99)

## 2017-03-03 MED ORDER — HYDROCODONE-ACETAMINOPHEN 5-325 MG PO TABS
1.0000 | ORAL_TABLET | Freq: Every day | ORAL | Status: DC | PRN
  Filled 2017-03-03: qty 1

## 2017-03-03 MED ORDER — TRAMADOL HCL 50 MG PO TABS
100.0000 mg | ORAL_TABLET | Freq: Three times a day (TID) | ORAL | Status: DC | PRN
  Administered 2017-03-03 – 2017-03-10 (×14): 100 mg via ORAL
  Filled 2017-03-03 (×14): qty 2

## 2017-03-03 MED ORDER — ALUM & MAG HYDROXIDE-SIMETH 200-200-20 MG/5ML PO SUSP: 30.0000 mL | ORAL | Status: DC | PRN

## 2017-03-03 MED ORDER — BUPROPION HCL ER (XL) 300 MG PO TB24
300.0000 mg | ORAL_TABLET | Freq: Every day | ORAL | Status: DC
  Administered 2017-03-04: 300 mg via ORAL
  Filled 2017-03-03 (×3): qty 1

## 2017-03-03 MED ORDER — TOPIRAMATE 100 MG PO TABS
200.0000 mg | ORAL_TABLET | Freq: Two times a day (BID) | ORAL | Status: DC
  Administered 2017-03-03 – 2017-03-06 (×6): 200 mg via ORAL
  Filled 2017-03-03 (×10): qty 2

## 2017-03-03 MED ORDER — DOCUSATE SODIUM 100 MG PO CAPS
100.0000 mg | ORAL_CAPSULE | Freq: Every day | ORAL | Status: DC
  Administered 2017-03-04: 100 mg via ORAL
  Filled 2017-03-03 (×3): qty 1

## 2017-03-03 MED ORDER — MAGNESIUM HYDROXIDE 400 MG/5ML PO SUSP
30.0000 mL | Freq: Every day | ORAL | Status: DC | PRN
  Administered 2017-03-04: 30 mL via ORAL
  Filled 2017-03-03: qty 30

## 2017-03-03 MED ORDER — TOPIRAMATE 25 MG PO TABS
200.0000 mg | ORAL_TABLET | Freq: Two times a day (BID) | ORAL | Status: DC
  Administered 2017-03-03: 200 mg via ORAL
  Filled 2017-03-03: qty 8

## 2017-03-03 MED ORDER — DICLOFENAC SODIUM 1 % TD GEL: 2.0000 g | Freq: Four times a day (QID) | TRANSDERMAL | Status: DC | PRN

## 2017-03-03 MED ORDER — METFORMIN HCL 500 MG PO TABS
500.0000 mg | ORAL_TABLET | Freq: Two times a day (BID) | ORAL | Status: DC
  Administered 2017-03-03 – 2017-03-10 (×14): 500 mg via ORAL
  Filled 2017-03-03 (×18): qty 1

## 2017-03-03 MED ORDER — ALBUTEROL SULFATE HFA 108 (90 BASE) MCG/ACT IN AERS: 2.0000 | INHALATION_SPRAY | Freq: Four times a day (QID) | RESPIRATORY_TRACT | Status: DC | PRN

## 2017-03-03 MED ORDER — ACETAMINOPHEN 325 MG PO TABS
650.0000 mg | ORAL_TABLET | Freq: Four times a day (QID) | ORAL | Status: DC | PRN
  Administered 2017-03-03 – 2017-03-06 (×2): 650 mg via ORAL
  Filled 2017-03-03 (×3): qty 2

## 2017-03-03 MED ORDER — TRAZODONE HCL 50 MG PO TABS
50.0000 mg | ORAL_TABLET | Freq: Every day | ORAL | Status: DC
  Administered 2017-03-03: 50 mg via ORAL
  Filled 2017-03-03 (×2): qty 1

## 2017-03-03 MED ORDER — VERAPAMIL HCL ER 120 MG PO TBCR
120.0000 mg | EXTENDED_RELEASE_TABLET | Freq: Every day | ORAL | Status: DC
  Administered 2017-03-03 – 2017-03-09 (×7): 120 mg via ORAL
  Filled 2017-03-03 (×9): qty 1

## 2017-03-03 NOTE — ED Notes (Signed)
Belongings inventoried and placed in [place] [address]. Pt given copy of medical clearance form.

## 2017-03-03 NOTE — ED Notes (Signed)
Pt wanded by security, cabinets locked, and meal given.

## 2017-03-03 NOTE — Progress Notes (Signed)
Verified through PMP Aware that patient has been receiving tramadol 50 mg and vicodin 5-325 mg monthly for several years. Ordered tramadol 100 mg every 8 hours prn per PTA meds.

## 2017-03-03 NOTE — ED Notes (Signed)
This RN paged security to wand pt. Heather Haley, Security responded to page and will come wand.

## 2017-03-03 NOTE — ED Notes (Signed)
Valuables delivered to security and medicine delivered to pharmacy.

## 2017-03-03 NOTE — Progress Notes (Signed)
Pt accepted to  [facility], Bed 500-1 Shuvon Rankin, NP is the accepting provider.  Dr. Nancy Fetter is the attending provider.  Call report to [phone]  [email or web address]  notified.   Pt is Voluntary.  Pt may be transported by [facility]  Pt scheduled  to arrive at [facility] as soon as transport can be arranged.  Areatha Keas. Judi Cong, MSW, [facility] Clinical Social Work [phone] (cell) [phone] (office)

## 2017-03-03 NOTE — ED Provider Notes (Signed)
12:18 AM  Mary from TTS recommends re-eval in AM and if symptoms of paranoia, delusions continue then possible inpt treatment.   Ritchard Paragas, Delice Bison, DO 03/03/17 [phone]

## 2017-03-03 NOTE — BHH Counselor (Signed)
Reassessment Note:  Pt states that she is "still hearing things in her ears and someone is talking to her through clicking and she feels deep inside they still want to kill her." She states that "they can see what she sees through her left eye" and she does not feel safe. She states that she needs "someone to look in her ear because there is a device deep inside so they they need to remove that." She states that the "device is tracking her as well". She also felt "electricity in her bed again last night".   Pt meets criteria for inpatient admission per Dr. Dwyane Dee. TTS to seek placement.  Bedelia Person, [place], [facility] Lead Triage Specialist  [facility]   Therapeutic Triage Services Phone: [phone] Fax: [phone]

## 2017-03-03 NOTE — Tx Team (Signed)
Initial Treatment Plan 03/03/2017 6:10 PM [name] EFE:[id number]    PATIENT STRESSORS: Financial difficulties Marital or family conflict  Health issues   PATIENT STRENGTHS: Capable of independent living Communication skills General fund of knowledge   PATIENT IDENTIFIED PROBLEMS: Psychosis/paranoia  Anxiety  "Get this crap out of my ears"  "I want to feel like people aren't out to get me"               DISCHARGE CRITERIA:  Improved stabilization in mood, thinking, and/or behavior Verbal commitment to aftercare and medication compliance  PRELIMINARY DISCHARGE PLAN: Outpatient therapy Medication management  PATIENT/FAMILY INVOLVEMENT: This treatment plan has been presented to and reviewed with the patient, Heather Haley.  The patient and family have been given the opportunity to ask questions and make suggestions.  Windell Moment, RN 03/03/2017, 6:10 PM

## 2017-03-03 NOTE — ED Notes (Signed)
Pt given 3XL shirt due to her other not fitting comfortably. MD steinl Made aware pt requesting Topamax.

## 2017-03-03 NOTE — Progress Notes (Signed)
Heather Haley is a 57 year old female being admitted voluntarily to 500-1 from Heather Haley.  She came in with A/V hallucinations and delusions.  She felt like people were under her house trying to electrocute her and making her feel physically sick.  She is not currently on psychiatric medications or seeing a provider.  She has multiple medical issues and allergies.  She has history of chronic pain due to left knee replacement and multiple left ankle surgeries.  During Heather Haley admission, she was pleasant and cooperative.  She denies SI/HI but continues to report hearing voices and "clicking" in her ear.  Oriented her to the unit.  Admission paperwork completed and signed.  Belongings searched and secured in locker # 22, no contraband found.  Skin assessment completed and noted abrasions to right forearm.  Q 15 minute checks initiated for safety.  We will continue to monitor the progress towards her goals.

## 2017-03-03 NOTE — ED Notes (Signed)
Breakfast tray ordered 

## 2017-03-04 DIAGNOSIS — M549 Dorsalgia, unspecified: Secondary | ICD-10-CM

## 2017-03-04 DIAGNOSIS — M2550 Pain in unspecified joint: Secondary | ICD-10-CM

## 2017-03-04 DIAGNOSIS — R440 Auditory hallucinations: Secondary | ICD-10-CM

## 2017-03-04 MED ORDER — SENNA 8.6 MG PO TABS
2.0000 | ORAL_TABLET | Freq: Two times a day (BID) | ORAL | Status: DC
  Administered 2017-03-04 – 2017-03-10 (×11): 17.2 mg via ORAL
  Filled 2017-03-04 (×15): qty 2

## 2017-03-04 MED ORDER — HALOPERIDOL 5 MG PO TABS: 5.0000 mg | ORAL_TABLET | Freq: Three times a day (TID) | ORAL | Status: DC | PRN

## 2017-03-04 MED ORDER — HALOPERIDOL 5 MG PO TABS
5.0000 mg | ORAL_TABLET | Freq: Two times a day (BID) | ORAL | Status: DC
  Administered 2017-03-04 – 2017-03-07 (×7): 5 mg via ORAL
  Filled 2017-03-04 (×10): qty 1

## 2017-03-04 MED ORDER — HYDROXYZINE HCL 50 MG PO TABS
50.0000 mg | ORAL_TABLET | Freq: Four times a day (QID) | ORAL | Status: DC | PRN
  Administered 2017-03-10: 50 mg via ORAL
  Filled 2017-03-04: qty 1

## 2017-03-04 MED ORDER — HALOPERIDOL LACTATE 5 MG/ML IJ SOLN: 5.0000 mg | Freq: Three times a day (TID) | INTRAMUSCULAR | Status: DC | PRN

## 2017-03-04 MED ORDER — DIPHENHYDRAMINE HCL 25 MG PO CAPS: 50.0000 mg | ORAL_CAPSULE | Freq: Every evening | ORAL | Status: DC | PRN

## 2017-03-04 NOTE — Progress Notes (Signed)
Recreation Therapy Notes  INPATIENT RECREATION THERAPY ASSESSMENT  Patient Details Name: Heather Haley MRN: [id number] DOB: August 12, 1960 Today's Date: 03/04/2017  Patient Stressors: Family, Work, Other (Comment)(Everything coming to a stop; constant pain)  Pt stated she was here because she felt like she was being shot. Pt also stated she wasn't getting any support from work.  Coping Skills:   Isolate, Avoidance, Exercise, Art/Dance, Talking, Music, Sports  Personal Challenges: Relationships, Self-Esteem/Confidence, Social Interaction  Leisure Interests (2+):  Exercise - Walking, Therapist, music - Other (Comment), Community - Grocery store(Go for a drive; sight seeing)  Awareness of Community Resources:  Yes  Community Resources:  Library, Other (Comment)(Grocery store)  Current Use: Yes  Patient Strengths:  Patient; Like to enjoy self  Patient Identified Areas of Improvement:  Get family back on track; Be able to get out more  Current Recreation Participation:  Once every other week  Patient Goal for Hospitalization:  "Get this crap out my head, feel safe at home and not feel like I'm being electricuted.  City of Residence:  [place] of Residence:  [place]  Current [place] (including self-harm):  No  Current HI:  No  Consent to Intern Participation: N/A    Victorino Sparrow, LRT/CTRS  Victorino Sparrow A 03/04/2017, 2:34 PM

## 2017-03-04 NOTE — Progress Notes (Signed)
DAR NOTE: Patient presents with anxious affect and depressed mood.  Reports pain and hearing click sound.  Denies suicidal thoughts and contracts for safety.  Described energy level as normal and concentration as good.  Rates depression at 0, hopelessness at 0, and anxiety at 5.  Maintained on routine safety checks.  Medications given as prescribed.  Support and encouragement offered as needed.  Attended group and participated.  States goal for today is "head stuff and body pain."  Patient visible in dayroom with minimal interactions.  Tramadol given for complain of left ankle pain with good effect.  Patient is safe on the unit.

## 2017-03-04 NOTE — Progress Notes (Signed)
Pt did not attend wrap-up group   

## 2017-03-04 NOTE — Plan of Care (Signed)
Patient is compliant with prescribed medications.
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
## 2017-03-04 NOTE — BHH Suicide Risk Assessment (Signed)
[facility] INPATIENT:  Family/Significant Other Suicide Prevention Education  Suicide Prevention Education:  Patient Refusal for Family/Significant Other Suicide Prevention Education: The patient Heather Haley has refused to provide written consent for family/significant other to be provided Family/Significant Other Suicide Prevention Education during admission and/or prior to discharge.  Physician notified.  Darleen Crocker 03/04/2017, 9:36 AM

## 2017-03-04 NOTE — Progress Notes (Addendum)
Nursing Progress Note: 7p-7a D: Pt currently presents with a anxious/disheveled/pleasant affect and behavior. Pt states "I am in a lot of pain/ I've had a lot of surgeries on my ankles. I can barely go through the day without meds especially Vicodin." Interacting minimally with the milieu. Pt reports poor sleep during the previous night at home. Pt did not attend wrap-up group.  A: Pt provided with medications per providers orders. Pt's labs and vitals were monitored throughout the night. Pt supported emotionally and encouraged to express concerns and questions. Pt educated on medications.  R: Pt's safety ensured with 15 minute and environmental checks. Pt currently denies SI, HI, and AVH. Pt verbally contracts to seek staff if SI,HI, or AVH occurs and to consult with staff before acting on any harmful thoughts. Will continue to monitor.

## 2017-03-04 NOTE — BHH Group Notes (Addendum)
[facility] LCSW Group Therapy 03/04/2017 1:15pm  Type of Therapy: Group Therapy- Feelings Around Discharge & Establishing a Supportive Framework  Participation Level:  Active  Description of Group:   What is a supportive framework? What does it look like feel like and how do I discern it from and unhealthy non-supportive network? Learn how to cope when supports are not helpful and don't support you. Discuss what to do when your family/friends are not supportive.  Summary of Patient Progress  Heather Haley attended group but was quiet for most of the group.  She believes that caring for other people who are in need is an act of courage.  She shared that her mother worked at [employer] and she often saw her mother taking care of others.  When asked to choose a picture that reminded her of courage she chose a picture but did not want to share it with the group.    Therapeutic Modalities:   Cognitive Behavioral Therapy Person-Centered Therapy Motivational Interviewing   Riley Lam Work 03/04/2017 10:22 AM

## 2017-03-04 NOTE — Progress Notes (Signed)
Adult Psychoeducational Group Note  Date:  03/04/2017 Time:  3:52 PM  Group Topic/Focus:  Recovery Goals:   The focus of this group is to identify appropriate goals for recovery and establish a plan to achieve them.  Participation Level:  Active  Participation Quality:  Appropriate, Attentive and Sharing  Affect:  Appropriate and Flat  Cognitive:  Alert, Appropriate and Oriented  Insight: Appropriate and Good  Engagement in Group:  Engaged and Supportive  Modes of Intervention:  Clarification, Discussion, Education and Exploration    Coralyn Mark Thang Flett 03/04/2017, 3:52 PM

## 2017-03-04 NOTE — Progress Notes (Signed)
Recreation Therapy Notes  Date: 03/04/17 Time: 1000 Location: 500 Hall Dayroom  Group Topic: Goal Setting  Goal Area(s) Addresses:  Patient will be able to identify at least 3 life goals.  Patient will be able to identify benefit of investing in life goals.  Patient will be able to identify benefit of setting life goals.   Behavioral Response: Engaged  Intervention: Worksheet, pencils  Activity: Life Goals.  Patients were given a worksheet broken down into 6 categories (family, friends, work/school, spirituality, body and mental health).  Patients were to then identify what they are doing well, where they need to improve and set a goal to make the improvement.  Education: Discharge Planning, Coping Skills  Education Outcome: Acknowledges Education/In Group Clarification Provided/Needs Additional Education  Clinical Observations: Pt started working on her sheet but left group with doctor and did not return.    Victorino Sparrow, LRT/CTRS      Victorino Sparrow A 03/04/2017 12:57 PM

## 2017-03-04 NOTE — Tx Team (Signed)
Interdisciplinary Treatment and Diagnostic Plan Update  03/04/2017 Time of Session: 1:29 PM  Heather Haley MRN: [id number]  Principal Diagnosis: <principal problem not specified>  Secondary Diagnoses: Active Problems:   MDD (major depressive disorder), recurrent, severe, with psychosis ([place])   Current Medications:  Current Facility-Administered Medications  Medication Dose Route Frequency Provider Last Rate Last Dose  . acetaminophen (TYLENOL) tablet 650 mg  650 mg Oral Q6H PRN Rankin, Shuvon B, NP   650 mg at 03/03/17 1825  . albuterol (PROVENTIL HFA;VENTOLIN HFA) 108 (90 Base) MCG/ACT inhaler 2 puff  2 puff Inhalation Q6H PRN Rankin, Shuvon B, NP      . alum & mag hydroxide-simeth (MAALOX/MYLANTA) 200-200-20 MG/5ML suspension 30 mL  30 mL Oral Q4H PRN Rankin, Shuvon B, NP      . haloperidol (HALDOL) tablet 5 mg  5 mg Oral BID Pennelope Bracken, MD   5 mg at 03/04/17 1157  . haloperidol (HALDOL) tablet 5 mg  5 mg Oral Q8H PRN Pennelope Bracken, MD       Or  . haloperidol lactate (HALDOL) injection 5 mg  5 mg Intramuscular Q8H PRN Pennelope Bracken, MD      . magnesium hydroxide (MILK OF MAGNESIA) suspension 30 mL  30 mL Oral Daily PRN Rankin, Shuvon B, NP   30 mL at 03/04/17 1115  . metFORMIN (GLUCOPHAGE) tablet 500 mg  500 mg Oral BID WC Rankin, Shuvon B, NP   500 mg at 03/04/17 0740  . senna (SENOKOT) tablet 17.2 mg  2 tablet Oral BID Pennelope Bracken, MD      . topiramate (TOPAMAX) tablet 200 mg  200 mg Oral BID Rankin, Shuvon B, NP   200 mg at 03/04/17 0740  . traMADol (ULTRAM) tablet 100 mg  100 mg Oral Q8H PRN Lindon Romp A, NP   100 mg at 03/04/17 0644  . verapamil (CALAN-SR) CR tablet 120 mg  120 mg Oral QHS Rankin, Shuvon B, NP   120 mg at 03/03/17 2221    PTA Medications: Medications Prior to Admission  Medication Sig Dispense Refill Last Dose  . acetaminophen (TYLENOL) 500 MG tablet Take 500 mg by mouth every 6 (six) hours as needed for  mild pain.   Past Week at Unknown time  . albuterol (PROVENTIL HFA;VENTOLIN HFA) 108 (90 Base) MCG/ACT inhaler Inhale 1-2 puffs into the lungs every 6 (six) hours as needed for wheezing.    Past Week at Unknown time  . aspirin EC 81 MG tablet Take 81 mg by mouth daily.   03/01/2017 at Unknown time  . CALCIUM PO Take 1 tablet by mouth daily.   03/01/2017 at Unknown time  . cholecalciferol (VITAMIN D) 1000 units tablet Take 1,000 Units by mouth daily.   03/01/2017 at Unknown time  . docusate sodium (COLACE) 100 MG capsule Take 100 mg by mouth daily.   03/01/2017 at Unknown time  . HYDROcodone-acetaminophen (NORCO/VICODIN) 5-325 MG tablet Take 1 tablet by mouth daily as needed for severe pain. (Patient taking differently: Take 1-2 tablets by mouth every 6 (six) hours as needed for moderate pain or severe pain. ) 30 tablet 0 03/02/2017 at Unknown time  . metFORMIN (GLUCOPHAGE) 500 MG tablet Take 1 tablet (500 mg total) by mouth 2 (two) times daily with a meal. 60 tablet 5 03/01/2017 at Unknown time  . pravastatin (PRAVACHOL) 40 MG tablet Take 40 mg by mouth at bedtime.   03/01/2017 at Unknown time  . ranitidine (ZANTAC)  150 MG capsule Take 150 mg by mouth as needed for heartburn.   Past Week at Unknown time  . rizatriptan (MAXALT) 10 MG tablet Take 10 mg by mouth as needed for migraine. May repeat in 2 hours if needed   Past Week at Unknown time  . senna (SENOKOT) 8.6 MG tablet Take 1-4 tablets by mouth daily as needed for constipation.   Past Week at Unknown time  . topiramate (TOPAMAX) 100 MG tablet Take 2 tablets (200 mg total) by mouth 2 (two) times daily. 360 tablet 3 03/01/2017 at Unknown time  . traMADol (ULTRAM) 50 MG tablet Take 2 tablets (100 mg total) by mouth every 8 (eight) hours as needed for moderate pain or severe pain. 180 tablet 2 03/02/2017 at Unknown time  . verapamil (CALAN-SR) 120 MG CR tablet Take 1 tablet (120 mg total) by mouth at bedtime. 90 tablet 1 03/01/2017 at Unknown time     Treatment Modalities: Medication Management, Group therapy, Case management,  1 to 1 session with clinician, Psychoeducation, Recreational therapy.  Patient Stressors: Financial difficulties Marital or family conflict  Patient Strengths: Capable of independent living Curator fund of knowledge   Physician Treatment Plan for Primary Diagnosis: <principal problem not specified> Long Term Goal(s): Improvement in symptoms so as ready for discharge  Short Term Goals:    Medication Management: Evaluate patient's response, side effects, and tolerance of medication regimen.  Therapeutic Interventions: 1 to 1 sessions, Unit Group sessions and Medication administration.  Evaluation of Outcomes: Progressing  Physician Treatment Plan for Secondary Diagnosis: Active Problems:   MDD (major depressive disorder), recurrent, severe, with psychosis ([place])  Long Term Goal(s): Improvement in symptoms so as ready for discharge  Short Term Goals:    Medication Management: Evaluate patient's response, side effects, and tolerance of medication regimen.  Therapeutic Interventions: 1 to 1 sessions, Unit Group sessions and Medication administration.  Evaluation of Outcomes: Progressing   RN Treatment Plan for Primary Diagnosis: <principal problem not specified> Long Term Goal(s): Knowledge of disease and therapeutic regimen to maintain health will improve  Short Term Goals: Ability to participate in decision making will improve, Ability to identify and develop effective coping behaviors will improve and Compliance with prescribed medications will improve  Medication Management: RN will administer medications as ordered by provider, will assess and evaluate patient's response and provide education to patient for prescribed medication. RN will report any adverse and/or side effects to prescribing provider.  Therapeutic Interventions: 1 on 1 counseling sessions, Psychoeducation,  Medication administration, Evaluate responses to treatment, Monitor vital signs and CBGs as ordered, Perform/monitor CIWA, COWS, AIMS and Fall Risk screenings as ordered, Perform wound care treatments as ordered.  Evaluation of Outcomes: Progressing   LCSW Treatment Plan for Primary Diagnosis: <principal problem not specified> Long Term Goal(s): Safe transition to appropriate next level of care at discharge, Engage patient in therapeutic group addressing interpersonal concerns.  Short Term Goals: Engage patient in aftercare planning with referrals and resources, Increase social support, Facilitate acceptance of mental health diagnosis and concerns, Identify triggers associated with mental health/substance abuse issues and Increase skills for wellness and recovery  Therapeutic Interventions: Assess for all discharge needs, 1 to 1 time with Social worker, Explore available resources and support systems, Assess for adequacy in community support network, Educate family and significant other(s) on suicide prevention, Complete Psychosocial Assessment, Interpersonal group therapy.  Evaluation of Outcomes: Progressing   Progress in Treatment: Attending groups: Yes Participating in groups: Yes Taking medication as  prescribed: Yes Toleration of medication: Yes, no side effects reported at this time Family/Significant other contact made: No  Patient understands diagnosis: No, limited insight  Discussing patient identified problems/goals with staff: Yes Medical problems stabilized or resolved: Yes Denies suicidal/homicidal ideation: Yes Issues/concerns per patient self-inventory: None Other: N/A  New problem(s) identified: None identified at this time.   New Short Term/Long Term Goal(s): "I want to feel safe when I leave here, and I want the clicking in my head to go away.   Discharge Plan or Barriers: Upon discharge pt will return to her home where she lives alone.  She will follow up with  [facility] in [place].   Reason for Continuation of Hospitalization: Anxiety Delusions Hallucinations Medication stabilization   Estimated Length of Stay: 03/09/17  Attendees: Patient: Heather Haley 03/04/2017  1:29 PM  Physician: Maris Berger, MD 03/04/2017  1:29 PM  Nursing: Jonette Mate, RN 03/04/2017  1:29 PM  RN Care Manager: Lars Pinks, RN 03/04/2017  1:29 PM  Social Worker: Ripley Fraise, [place]; Verdis Frederickson, Social Work Intern 03/04/2017  1:29 PM  Recreational Therapist: Victorino Sparrow, LRT 03/04/2017  1:29 PM  Other: Norberto Sorenson, [place] 03/04/2017  1:29 PM  Other:  03/04/2017  1:29 PM  Other: 03/04/2017  1:29 PM    Scribe for Treatment Team: Trish Mage, LCSW 03/04/2017 1:29 PM

## 2017-03-04 NOTE — H&P (Signed)
Psychiatric Admission Assessment Adult  Patient Identification: Heather Haley MRN:  [id number] Date of Evaluation:  03/04/2017 Chief Complaint:  MDD WITH PSYCHOTIC FEATURES Principal Diagnosis: MDD (major depressive disorder), recurrent, severe, with psychosis ([place]) Diagnosis:   Patient Active Problem List   Diagnosis Date Noted  . MDD (major depressive disorder), recurrent, severe, with psychosis ([place]) [F33.3] 03/03/2017  . Contracture, tendon sheath [M62.40] 03/23/2016  . Dysuria [R30.0] 06/17/2015  . Pure hypercholesterolemia [E78.00] 05/29/2015  . Opioid-induced constipation (OIC) [K59.03, T40.2X5A] 05/19/2015  . Osteoarthritis of hip (Right) [M16.11] 05/08/2015  . Chronic hip pain (Right) [M25.551, G89.29] 05/05/2015  . Long term prescription opiate use [Z79.891] 05/05/2015  . Chronic knee pain (S/P TKR:Total Knee Replacement) (Left) [M25.003, G89.29] 05/05/2015  . Osteoarthritis of hips (Location of Secondary source of pain) (Bilateral) (Right) [M16.0] 05/05/2015  . History of total knee replacement (Left) [Z96.659] 05/05/2015  . Chronic groin pain (Location of Secondary source of pain) (Right) [R10.31, G89.29] 05/05/2015  . Chronic lower extremity pain (Location of Tertiary source of pain) (Bilateral) (R>L) [M79.606, G89.29] 05/05/2015  . Chronic pain [G89.29] 02/05/2015  . Chronic ankle pain (secondary to a work-related injury) (Date of injury 04/17/2006) (Left) [B04.888, G89.29] 12/25/2014  . Chronic low back pain (Location of Primary Pain) (Bilateral) (R>L) [M54.5, G89.29] 12/25/2014  . Lumbar spondylosis (L3-4 & L4-5) [B16.945] 12/25/2014  . Lumbar facet syndrome (Location of Primary Source of Pain) (Bilateral) (R>L) [M47.816] 12/25/2014  . Encounter for therapeutic drug level monitoring [Z51.81] 11/19/2014  . Encounter for long-term opiate analgesic use [Z79.891] 11/19/2014  . Long-term current use of opiate analgesic [Z79.891] 11/19/2014  . Uncomplicated opioid  dependence ([place]) [F11.20] 11/19/2014  . Opiate use (35 MME/Day) [F11.90] 11/19/2014  . Chronic pain syndrome [G89.4] 11/19/2014  . Diabetic sensory peripheral neuropathy (Bilateral Lower Extremity) [E11.42] 11/19/2014  . Generalized anxiety disorder [F41.1] 11/19/2014  . History of panic attacks [Z86.59] 11/19/2014  . History of tobacco abuse [Z87.891] 11/19/2014  . GERD (gastroesophageal reflux disease) [K21.9] 11/19/2014  . Non-insulin dependent type 2 diabetes mellitus ([place]) [E11.9] 11/19/2014  . Coccygeal pain [M53.3] 11/19/2014  . History of migraine [Z86.69] 11/19/2014  . Chronic constipation [K59.09] 10/24/2013  . Personal history of other diseases of the digestive system [Z87.19] 10/24/2013  . Leg length inequality [M21.70] 09/12/2012  . Morbid obesity ([place]) [E66.01] 05/26/2012  . Hypertension [I10]   . IBS (irritable bowel syndrome) [K58.9]   . Depression, major, recurrent, moderate ([place]) [F33.1]   . ADVERSE DRUG REACTION [T88.7XXA] 12/15/2009   History of Present Illness:   Heather Haley is a 61 y/p F with history of MDD with psychotic features (and self reported history of schizophrenia) and multiple medical complaints who was admitted voluntarily from ED with worsening symptoms of AH, tactile hallucinations, delusions, depression, and anxiety. Pt reported that she has been feeling as if she has been electrocuted in her own home and she has been hearing AH of clicks which she able to communicate with. She also endorses paranoia of being followed.   Upon interview, pt shares, "For a long time I've been feeling like I'm being electrocuted; I was here before but it's picked up a lot since then, and here I am." Pt also endorses AH of "a female and female voice talking," but recently her AH have changed into "clicks" which pt is able to interpret and understand and communication, and she interprets the [facility] as commanding her to do things and increasing her thoughts that others are  following her. Pt endorses  being followed by a woman from [place] and other people attempting to steal her PIN number by looking over her shoulder. She denies SI/HI/VH. She endorses feeling tactile hallucinations of being electrocuted from under her home. She reports poor sleep for the past 2 nights, but typically she sleeps adequately. She endorses depressive symptoms of depressed mood, anhedonia, guilty feelings, low energy, poor concentration, and psychomotor retardation. She cites stressors of multiple medical conditions (including chronic pain) and physical limitations which limit her ability to walk/interact with her family. She denies symptoms of mania, OCD, and PTSD (though she endorses significant trauma exposure). She denies illicit substance use.  Discussed with patient about treatment options. All of her medications are managed by her pain management physician, and currently the only psychotropic medication she takes is topamax for headaches. She agrees to trial of haldol for treatment of her symptoms of psychosis as she expresses interest in an long-acting injectable medication which may be affordable. Pt was in agreement to resume her other current home medications, and she had no further questions, comments, or concerns.   Associated Signs/Symptoms: Depression Symptoms:  depressed mood, anhedonia, insomnia, psychomotor retardation, fatigue, feelings of worthlessness/guilt, difficulty concentrating, anxiety, (Hypo) Manic Symptoms:  Delusions, Hallucinations, Anxiety Symptoms:  Excessive Worry, Psychotic Symptoms:  Delusions, Hallucinations: Auditory Tactile Ideas of Reference, Paranoia, PTSD Symptoms: Had a traumatic exposure:  multiple traumas during childhood/adolesence/adulthood Total Time spent with patient: 1 hour  Past Psychiatric History:  - previous diagnoses of MDD with psychotic features and schizophrenia - 1 previous inpatient admission to inpatient unit in  [place] around 2016 - No current outpatient psychiatrist - No history of suicide attempt  Is the patient at risk to self? Yes.    Has the patient been a risk to self in the past 6 months? Yes.    Has the patient been a risk to self within the distant past? Yes.    Is the patient a risk to others? Yes.    Has the patient been a risk to others in the past 6 months? Yes.    Has the patient been a risk to others within the distant past? Yes.     Prior Inpatient Therapy:   Prior Outpatient Therapy:    Alcohol Screening: 1. How often do you have a drink containing alcohol?: Never 2. How many drinks containing alcohol do you have on a typical day when you are drinking?: 1 or 2 3. How often do you have six or more drinks on one occasion?: Never AUDIT-C Score: 0 9. Have you or someone else been injured as a result of your drinking?: No 10. Has a relative or friend or a doctor or another health worker been concerned about your drinking or suggested you cut down?: No Alcohol Use Disorder Identification Test Final Score (AUDIT): 0 Intervention/Follow-up: AUDIT Score <7 follow-up not indicated Substance Abuse History in the last 12 months:  No. Consequences of Substance Abuse: NA Previous Psychotropic Medications: Yes  Psychological Evaluations: Yes  Past Medical History:  Past Medical History:  Diagnosis Date  . Anxiety   . Chronic pain    previously saw Dr. Consuela Mimes in pain clinic, then saw pain specialist in [place]  . Depression   . Diabetes mellitus ([place])   . Frequency of urination   . GERD (gastroesophageal reflux disease)   . Headache(784.0)   . High cholesterol   . Hypertension   . IBS (irritable bowel syndrome)   . Left ankle instability   . Left knee  DJD   . Lumbar Degenerative Disc Disease of  10/11/2014  . Neuromuscular disorder ([place])   . Osteoarthritis of hip (Right) 05/05/2015  . Other enthesopathy of ankle and tarsus 12/15/2009   Qualifier: Diagnosis of  By: Oneida Alar MD,  KARL    . Peripheral sensory neuropathy (Bilateral) 11/19/2014  . Postoperative nausea and vomiting     Past Surgical History:  Procedure Laterality Date  . ABDOMINAL HYSTERECTOMY    . ANKLE SURGERY    . APPENDECTOMY    . COLONOSCOPY  2013  . ESOPHAGOGASTRODUODENOSCOPY (EGD) WITH PROPOFOL N/A 10/03/2014   Procedure: ESOPHAGOGASTRODUODENOSCOPY (EGD) WITH PROPOFOL;  Surgeon: Josefine Class, MD;  Location: [facility] ENDOSCOPY;  Service: Endoscopy;  Laterality: N/A;  . KNEE ARTHROSCOPY  1997   left knee  . LEFT HEART CATHETERIZATION WITH CORONARY ANGIOGRAM N/A 01/11/2013   Procedure: LEFT HEART CATHETERIZATION WITH CORONARY ANGIOGRAM;  Surgeon: Sinclair Grooms, MD;  Location[facility] C[facility] LAB;  Service: Cardiovascular;  Laterality: N/A;  . TOTAL KNEE ARTHROPLASTY  08/30/2011   Procedure: TOTAL KNEE ARTHROPLASTY;  Surgeon: Lorn Junes, MD;  Location: [facility];  Service: Orthopedics;  Laterality: Left;   Family History:  Family History  Problem Relation Age of Onset  . Cancer Mother   . Hypertension Father   . Heart disease Father    Family Psychiatric  History: pt's father completed suicide Tobacco Screening: Have you used any form of tobacco in the last 30 days? (Cigarettes, Smokeless Tobacco, Cigars, and/or Pipes): No Social History:  Social History   Substance and Sexual Activity  Alcohol Use No  . Alcohol/week: 0.0 oz     Social History   Substance and Sexual Activity  Drug Use No    Additional Social History: Marital status: Single Are you sexually active?: No What is your sexual orientation?: Heterosexual  Does patient have children?: Yes How many children?: 2 How is patient's relationship with their children?: "Distant, we just grew apart"                          Allergies:   Allergies  Allergen Reactions  . Buprenorphine Hcl Other (See Comments)    Unable to void  . Codeine Hives    REACTION: hives  . Cymbalta [Duloxetine Hcl] Other (See Comments)     Altered mental status  . Duloxetine Other (See Comments)    Altered mental status Alopecia, visual hallucinations, nightmares  . Gabapentin Swelling  . Lyrica [Pregabalin] Other (See Comments)    Alopecia, visual hallucinations, nightmares Altered mental status Alopecia, visual hallucinations, nightmares Altered mental status  . Morphine And Related Other (See Comments)    Unable to void  . Nortripytline Hcl [Nortriptyline] Other (See Comments)    Hair loss and night mares Hair loss and night mares  . Nsaids     REACTION: palpitations, diaphoresis  . Penicillin G Nausea And Vomiting  . Penicillins     REACTION: upset stomach  . Tolmetin Other (See Comments)    REACTION: palpitations, diaphoresis  . Wellbutrin [Bupropion]     Constipation, mood swings   Lab Results:  Results for orders placed or performed during the hospital encounter of 03/02/17 (from the past 48 hour(s))  Comprehensive metabolic panel     Status: Abnormal   Collection Time: 03/02/17  4:58 PM  Result Value Ref Range   Sodium 139 135 - 145 mmol/L   Potassium 3.5 3.5 - 5.1 mmol/L   Chloride 104  101 - 111 mmol/L   CO2 20 (L) 22 - 32 mmol/L   Glucose, Bld 136 (H) 65 - 99 mg/dL   BUN 9 6 - 20 mg/dL   Creatinine, Ser 1.18 (H) 0.44 - 1.00 mg/dL   Calcium 10.0 8.9 - 10.3 mg/dL   Total Protein 8.3 (H) 6.5 - 8.1 g/dL   Albumin 4.7 3.5 - 5.0 g/dL   AST 27 15 - 41 U/L   ALT 24 14 - 54 U/L   Alkaline Phosphatase 114 38 - 126 U/L   Total Bilirubin 0.8 0.3 - 1.2 mg/dL   GFR calc non Af Amer 50 (L) >60 mL/min   GFR calc Af Amer 58 (L) >60 mL/min    Comment: (NOTE) The eGFR has been calculated using the CKD EPI equation. This calculation has not been validated in all clinical situations. eGFR's persistently <60 mL/min signify possible Chronic Kidney Disease.    Anion gap 15 5 - 15  Ethanol     Status: None   Collection Time: 03/02/17  4:58 PM  Result Value Ref Range   Alcohol, Ethyl (B) <10 <10 mg/dL     Comment:        LOWEST DETECTABLE LIMIT FOR SERUM ALCOHOL IS 10 mg/dL FOR MEDICAL PURPOSES ONLY   Salicylate level     Status: None   Collection Time: 03/02/17  4:58 PM  Result Value Ref Range   Salicylate Lvl <5.3 2.8 - 30.0 mg/dL  Acetaminophen level     Status: Abnormal   Collection Time: 03/02/17  4:58 PM  Result Value Ref Range   Acetaminophen (Tylenol), Serum <10 (L) 10 - 30 ug/mL    Comment:        THERAPEUTIC CONCENTRATIONS VARY SIGNIFICANTLY. A RANGE OF 10-30 ug/mL MAY BE AN EFFECTIVE CONCENTRATION FOR MANY PATIENTS. HOWEVER, SOME ARE BEST TREATED AT CONCENTRATIONS OUTSIDE THIS RANGE. ACETAMINOPHEN CONCENTRATIONS >150 ug/mL AT 4 HOURS AFTER INGESTION AND >50 ug/mL AT 12 HOURS AFTER INGESTION ARE OFTEN ASSOCIATED WITH TOXIC REACTIONS.   cbc     Status: Abnormal   Collection Time: 03/02/17  4:58 PM  Result Value Ref Range   WBC 13.0 (H) 4.0 - 10.5 K/uL   RBC 5.74 (H) 3.87 - 5.11 MIL/uL   Hemoglobin 17.4 (H) 12.0 - 15.0 g/dL   HCT 51.4 (H) 36.0 - 46.0 %   MCV 89.5 78.0 - 100.0 fL   MCH 30.3 26.0 - 34.0 pg   MCHC 33.9 30.0 - 36.0 g/dL   RDW 13.2 11.5 - 15.5 %   Platelets 300 150 - 400 K/uL  Rapid urine drug screen (hospital performed)     Status: Abnormal   Collection Time: 03/02/17  9:38 PM  Result Value Ref Range   Opiates POSITIVE (A) NONE DETECTED   Cocaine NONE DETECTED NONE DETECTED   Benzodiazepines NONE DETECTED NONE DETECTED   Amphetamines NONE DETECTED NONE DETECTED   Tetrahydrocannabinol NONE DETECTED NONE DETECTED   Barbiturates NONE DETECTED NONE DETECTED    Comment: (NOTE) DRUG SCREEN FOR MEDICAL PURPOSES ONLY.  IF CONFIRMATION IS NEEDED FOR ANY PURPOSE, NOTIFY LAB WITHIN 5 DAYS. LOWEST DETECTABLE LIMITS FOR URINE DRUG SCREEN Drug Class                     Cutoff (ng/mL) Amphetamine and metabolites    1000 Barbiturate and metabolites    200 Benzodiazepine                 646 Tricyclics and  metabolites     300 Opiates and metabolites         300 Cocaine and metabolites        300 THC                            50   Urinalysis, Routine w reflex microscopic     Status: Abnormal   Collection Time: 03/02/17  9:38 PM  Result Value Ref Range   Color, Urine YELLOW YELLOW   APPearance CLEAR CLEAR   Specific Gravity, Urine 1.008 1.005 - 1.030   pH 5.0 5.0 - 8.0   Glucose, UA NEGATIVE NEGATIVE mg/dL   Hgb urine dipstick SMALL (A) NEGATIVE   Bilirubin Urine NEGATIVE NEGATIVE   Ketones, ur NEGATIVE NEGATIVE mg/dL   Protein, ur NEGATIVE NEGATIVE mg/dL   Nitrite NEGATIVE NEGATIVE   Leukocytes, UA NEGATIVE NEGATIVE   RBC / HPF NONE SEEN 0 - 5 RBC/hpf   WBC, UA 0-5 0 - 5 WBC/hpf   Bacteria, UA RARE (A) NONE SEEN   Squamous Epithelial / LPF 0-5 (A) NONE SEEN   Mucus PRESENT    Hyaline Casts, UA PRESENT   CBG monitoring, ED     Status: Abnormal   Collection Time: 03/03/17 10:36 AM  Result Value Ref Range   Glucose-Capillary 220 (H) 65 - 99 mg/dL    Blood Alcohol level:  Lab Results  Component Value Date   ETH <10 94/49/6759    Metabolic Disorder Labs:  Lab Results  Component Value Date   HGBA1C 5.4 05/17/2015   MPG 108 05/17/2015   MPG 128 (H) 06/19/2012   No results found for: PROLACTIN Lab Results  Component Value Date   CHOL 190 05/17/2015   TRIG 81 05/17/2015   HDL 81 05/17/2015   CHOLHDL 2.3 05/17/2015   VLDL 16 05/17/2015   LDLCALC 93 05/17/2015    Current Medications: Current Facility-Administered Medications  Medication Dose Route Frequency Provider Last Rate Last Dose  . acetaminophen (TYLENOL) tablet 650 mg  650 mg Oral Q6H PRN Rankin, Shuvon B, NP   650 mg at 03/03/17 1825  . albuterol (PROVENTIL HFA;VENTOLIN HFA) 108 (90 Base) MCG/ACT inhaler 2 puff  2 puff Inhalation Q6H PRN Rankin, Shuvon B, NP      . alum & mag hydroxide-simeth (MAALOX/MYLANTA) 200-200-20 MG/5ML suspension 30 mL  30 mL Oral Q4H PRN Rankin, Shuvon B, NP      . haloperidol (HALDOL) tablet 5 mg  5 mg Oral BID Pennelope Bracken, MD   5 mg at 03/04/17 1157  . haloperidol (HALDOL) tablet 5 mg  5 mg Oral Q8H PRN Pennelope Bracken, MD       Or  . haloperidol lactate (HALDOL) injection 5 mg  5 mg Intramuscular Q8H PRN Pennelope Bracken, MD      . magnesium hydroxide (MILK OF MAGNESIA) suspension 30 mL  30 mL Oral Daily PRN Rankin, Shuvon B, NP   30 mL at 03/04/17 1115  . metFORMIN (GLUCOPHAGE) tablet 500 mg  500 mg Oral BID WC Rankin, Shuvon B, NP   500 mg at 03/04/17 0740  . senna (SENOKOT) tablet 17.2 mg  2 tablet Oral BID Pennelope Bracken, MD      . topiramate (TOPAMAX) tablet 200 mg  200 mg Oral BID Rankin, Shuvon B, NP   200 mg at 03/04/17 0740  . traMADol (ULTRAM) tablet 100 mg  100 mg Oral Q8H PRN  Lindon Romp A, NP   100 mg at 03/04/17 0644  . verapamil (CALAN-SR) CR tablet 120 mg  120 mg Oral QHS Rankin, Shuvon B, NP   120 mg at 03/03/17 2221   PTA Medications: Medications Prior to Admission  Medication Sig Dispense Refill Last Dose  . acetaminophen (TYLENOL) 500 MG tablet Take 500 mg by mouth every 6 (six) hours as needed for mild pain.   Past Week at Unknown time  . albuterol (PROVENTIL HFA;VENTOLIN HFA) 108 (90 Base) MCG/ACT inhaler Inhale 1-2 puffs into the lungs every 6 (six) hours as needed for wheezing.    Past Week at Unknown time  . aspirin EC 81 MG tablet Take 81 mg by mouth daily.   03/01/2017 at Unknown time  . CALCIUM PO Take 1 tablet by mouth daily.   03/01/2017 at Unknown time  . cholecalciferol (VITAMIN D) 1000 units tablet Take 1,000 Units by mouth daily.   03/01/2017 at Unknown time  . docusate sodium (COLACE) 100 MG capsule Take 100 mg by mouth daily.   03/01/2017 at Unknown time  . HYDROcodone-acetaminophen (NORCO/VICODIN) 5-325 MG tablet Take 1 tablet by mouth daily as needed for severe pain. (Patient taking differently: Take 1-2 tablets by mouth every 6 (six) hours as needed for moderate pain or severe pain. ) 30 tablet 0 03/02/2017 at Unknown time  . metFORMIN (GLUCOPHAGE)  500 MG tablet Take 1 tablet (500 mg total) by mouth 2 (two) times daily with a meal. 60 tablet 5 03/01/2017 at Unknown time  . pravastatin (PRAVACHOL) 40 MG tablet Take 40 mg by mouth at bedtime.   03/01/2017 at Unknown time  . ranitidine (ZANTAC) 150 MG capsule Take 150 mg by mouth as needed for heartburn.   Past Week at Unknown time  . rizatriptan (MAXALT) 10 MG tablet Take 10 mg by mouth as needed for migraine. May repeat in 2 hours if needed   Past Week at Unknown time  . senna (SENOKOT) 8.6 MG tablet Take 1-4 tablets by mouth daily as needed for constipation.   Past Week at Unknown time  . topiramate (TOPAMAX) 100 MG tablet Take 2 tablets (200 mg total) by mouth 2 (two) times daily. 360 tablet 3 03/01/2017 at Unknown time  . traMADol (ULTRAM) 50 MG tablet Take 2 tablets (100 mg total) by mouth every 8 (eight) hours as needed for moderate pain or severe pain. 180 tablet 2 03/02/2017 at Unknown time  . verapamil (CALAN-SR) 120 MG CR tablet Take 1 tablet (120 mg total) by mouth at bedtime. 90 tablet 1 03/01/2017 at Unknown time    Musculoskeletal: Strength & Muscle Tone: within normal limits Gait & Station: normal Patient leans: N/A  Psychiatric Specialty Exam: Physical Exam  Nursing note and vitals reviewed.   Review of Systems  Constitutional: Negative for chills and fever.  Respiratory: Negative for cough and sputum production.   Cardiovascular: Negative for chest pain.  Gastrointestinal: Negative for abdominal pain, heartburn, nausea and vomiting.  Musculoskeletal: Positive for back pain and joint pain.  Psychiatric/Behavioral: Positive for depression and hallucinations. Negative for substance abuse and suicidal ideas. The patient is nervous/anxious and has insomnia.     Blood pressure 100/60, pulse (!) 101, temperature 98 F (36.7 C), temperature source Oral, resp. rate 20, height 5' 5.5" (1.664 m), weight 103.4 kg (228 lb).Body mass index is 37.36 kg/m.  General Appearance: Casual   Eye Contact:  Good  Speech:  Clear and Coherent and Normal Rate  Volume:  Normal  Mood:  Anxious and Depressed  Affect:  Appropriate and Congruent  Thought Process:  Coherent, Goal Directed and Descriptions of Associations: Loose  Orientation:  Full (Time, Place, and Person)  Thought Content:  Delusions, Hallucinations: Auditory Tactile, Ideas of Reference:   Paranoia Delusions, Obsessions, Paranoid Ideation and Abstract Reasoning  Suicidal Thoughts:  No  Homicidal Thoughts:  No  Memory:  Immediate;   Fair Recent;   Fair Remote;   Fair  Judgement:  Fair  Insight:  Fair  Psychomotor Activity:  Normal  Concentration:  Concentration: Fair  Recall:  [employer] of Knowledge:  Fair  Language:  Fair  Akathisia:  No  Handed:    AIMS (if indicated):     Assets:  Communication Skills Leisure Time Physical Health Resilience Social Support  ADL's:  Intact  Cognition:  WNL  Sleep:       Treatment Plan Summary: Daily contact with patient to assess and evaluate symptoms and progress in treatment and Medication management  Observation Level/Precautions:  15 minute checks  Laboratory:  CBC Chemistry Profile HbAIC UDS TSH, prolactin  Psychotherapy:  Encourage participation in groups and the therapeutic milieu  Medications:  Start haldol 34m po BID and haldol 531mpo q8h prn psychosis, restart all other home medications as current ordered  Consultations:    Discharge Concerns:    Estimated LOS: 5-7 days  Other:     Physician Treatment Plan for Primary Diagnosis: MDD (major depressive disorder), recurrent, severe, with psychosis ([place] Improvement in symptoms so as ready for discharge  Short Term Goals: Ability to identify changes in lifestyle to reduce recurrence of condition will improve and Ability to identify and develop effective coping behaviors will improve  Physician Treatment Plan for Secondary Diagnosis: Principal Problem:   MDD (major depressive  disorder), recurrent, severe, with psychosis ([place] Long Term Goal(s): Improvement in symptoms so as ready for discharge  Short Term Goals: Ability to identify and develop effective coping behaviors will improve  I certify that inpatient services furnished can reasonably be expected to improve the patient's condition.    ChPennelope BrackenMD 1/25/20193:39 PM

## 2017-03-05 DIAGNOSIS — F333 Major depressive disorder, recurrent, severe with psychotic symptoms: Principal | ICD-10-CM

## 2017-03-05 DIAGNOSIS — I10 Essential (primary) hypertension: Secondary | ICD-10-CM

## 2017-03-05 DIAGNOSIS — F39 Unspecified mood [affective] disorder: Secondary | ICD-10-CM

## 2017-03-05 DIAGNOSIS — R443 Hallucinations, unspecified: Secondary | ICD-10-CM

## 2017-03-05 DIAGNOSIS — R450 Nervousness: Secondary | ICD-10-CM

## 2017-03-05 DIAGNOSIS — F419 Anxiety disorder, unspecified: Secondary | ICD-10-CM

## 2017-03-05 DIAGNOSIS — Z87891 Personal history of nicotine dependence: Secondary | ICD-10-CM

## 2017-03-05 DIAGNOSIS — G4700 Insomnia, unspecified: Secondary | ICD-10-CM

## 2017-03-05 NOTE — [facility] Group Notes (Signed)
[facility] Group Notes:  (Nursing/MHT/Case Management/Adjunct)  Date:  03/05/2017  Time:  11:58 AM  Type of Therapy:  Psychoeducational Skills  Participation Level:  Active  Participation Quality:  Appropriate  Affect:  Appropriate  Cognitive:  Appropriate  Insight:  Appropriate  Engagement in Group:  Engaged  Modes of Intervention:  Role-play  Summary of Progress/Problems: Topic of group was assertiveness, pt was receptive.   Heather Haley Shanta 03/05/2017, 11:58 AM

## 2017-03-05 NOTE — Progress Notes (Signed)
Adult Psychoeducational Group Note  Date:  03/05/2017 Time:  8:49 PM  Group Topic/Focus:  Wrap-Up Group:   The focus of this group is to help patients review their daily goal of treatment and discuss progress on daily workbooks.  Participation Level:  Active  Participation Quality:  Appropriate  Affect:  Appropriate  Cognitive:  Appropriate  Insight: Appropriate  Engagement in Group:  Improving  Modes of Intervention:  Discussion  Additional Comments: The patient expressed that she rates today a 7.The patient also said that she glad to have her medications adjusted.  Nash Shearer 03/05/2017, 8:49 PM

## 2017-03-05 NOTE — Progress Notes (Signed)
Patient ID: Heather Haley, female   DOB: [birth year], 57 y.o.   MRN: [id number]  DAR Note: Pt remained in bed with eyes closed for most of the evening. Pt when asked denied anxiety, depression, AVH, HI or SI. Pt however, complained severe R. ankle pain-see MAR. All patient's questions and concerns addressed. Support, encouragement, and safe environment provided.  15-minute safety checks continue. Pt was med compliant.

## 2017-03-05 NOTE — [facility] Group Notes (Signed)
[facility] Group Notes:  (Nursing/MHT/Case Management/Adjunct)  Date:  03/05/2017  Time:  3:29 PM  Type of Therapy:  Psychoeducational Skills  Participation Level:  Did Not Attend  Participation Quality:  Did not attend.  Affect:  Did not attend.  Cognitive:  Dis not attend  Insight:  None  Engagement in Group:  Dis not attend.  Modes of Intervention:  Dis not attend.  Summary of Progress/Problems: Topic of group was triggers for anger, pt did not attend.  Benancio Deeds Shanta 03/05/2017, 3:29 PM

## 2017-03-05 NOTE — Progress Notes (Signed)
D. Pt presents with an appropriate affect and behavior. Pt currently denies SI/HI and endorses occasionally hearing 'click' sounds - pt reports feeling 'electricity' coming off of the bed when laying down in bed.  A. Labs and vitals monitored. Pt compliant with medications. Pt supported emotionally and encouraged to express concerns and ask questions.   R. Pt remains safe with 15 minute checks. Will continue POC.

## 2017-03-05 NOTE — [facility] Group Notes (Signed)
  [facility] LCSW Group Therapy Note  Date/Time:  03/05/2017 11:30AM-12:00PM  Type of Therapy and Topic:  Group Therapy:  Feelings About Hospitalization  Participation Level:  Active   Description of Group This process group involved patients discussing their feelings related to being hospitalized, as well as the benefits they see to being in the hospital.  These feelings and benefits were itemized.  The group then brainstormed specific ways in which they could seek those same benefits when they discharge and return home.  Therapeutic Goals 1. Patient will identify and describe positive and negative feelings related to hospitalization 2. Patient will verbalize benefits of hospitalization to themselves personally 3. Patients will brainstorm together ways they can obtain similar benefits in the outpatient setting, identify barriers to wellness and possible solutions  Summary of Patient Progress:  The patient expressed her primary feelings about being hospitalized are that it is helping her so she feels good about it, because it really needed to be figured out what was wrong.  She stated that when she leaves the hospital she intends to stay well by taking her medications and talking to her children, whom she stated she hopes will be willing and interested in listening to her.  She did express doubts about this and CSW suggested that perhaps hospital staff could explain her illness to her children to help them become more supportive.  Therapeutic Modalities Cognitive Behavioral Therapy Motivational Interviewing    Selmer Dominion, LCSW 03/05/2017, 1:11 PM

## 2017-03-05 NOTE — Progress Notes (Signed)
[facility] MD Progress Note  03/05/2017 9:03 AM Heather Haley  MRN:  [id number]   Subjective:  Heather Haley reports " I am feeling and getting better the shocks are getting better."  Objective: Heather Haley seen resting in bed. Reports she feels electrical impulse throughout her body. Reports auditory hallucination of "clicks" which she reports are improving with medications.   Denies suicidal or homicidal ideation during this assessment. Patient presents with delusion thoughts and reported intermittent paranoia. Reports she was able to rest well on last night and states she is trying rest all day today.  Patient was encouraged to attend some group sessions. Reports taken and tolerating Haldol and Topamax, denies any medication side effects during this assessment. Labs ordered for Sunday collections- EKG pending.  Support, encouragement and reassurance was provided.   Principal Problem: MDD (major depressive disorder), recurrent, severe, with psychosis ([place]) Diagnosis:   Patient Active Problem List   Diagnosis Date Noted  . MDD (major depressive disorder), recurrent, severe, with psychosis ([place]) [F33.3] 03/03/2017  . Contracture, tendon sheath [M62.40] 03/23/2016  . Dysuria [R30.0] 06/17/2015  . Pure hypercholesterolemia [E78.00] 05/29/2015  . Opioid-induced constipation (OIC) [K59.03, T40.2X5A] 05/19/2015  . Osteoarthritis of hip (Right) [M16.11] 05/08/2015  . Chronic hip pain (Right) [M25.551, G89.29] 05/05/2015  . Long term prescription opiate use [Z79.891] 05/05/2015  . Chronic knee pain (S/P TKR:Total Knee Replacement) (Left) [I37.048, G89.29] 05/05/2015  . Osteoarthritis of hips (Location of Secondary source of pain) (Bilateral) (Right) [M16.0] 05/05/2015  . History of total knee replacement (Left) [Z96.659] 05/05/2015  . Chronic groin pain (Location of Secondary source of pain) (Right) [R10.31, G89.29] 05/05/2015  . Chronic lower extremity pain (Location of Tertiary source of pain)  (Bilateral) (R>L) [M79.606, G89.29] 05/05/2015  . Chronic pain [G89.29] 02/05/2015  . Chronic ankle pain (secondary to a work-related injury) (Date of injury 04/17/2006) (Left) [G89.169, G89.29] 12/25/2014  . Chronic low back pain (Location of Primary Pain) (Bilateral) (R>L) [M54.5, G89.29] 12/25/2014  . Lumbar spondylosis (L3-4 & L4-5) [I50.388] 12/25/2014  . Lumbar facet syndrome (Location of Primary Source of Pain) (Bilateral) (R>L) [M47.816] 12/25/2014  . Encounter for therapeutic drug level monitoring [Z51.81] 11/19/2014  . Encounter for long-term opiate analgesic use [Z79.891] 11/19/2014  . Long-term current use of opiate analgesic [Z79.891] 11/19/2014  . Uncomplicated opioid dependence ([place]) [F11.20] 11/19/2014  . Opiate use (35 MME/Day) [F11.90] 11/19/2014  . Chronic pain syndrome [G89.4] 11/19/2014  . Diabetic sensory peripheral neuropathy (Bilateral Lower Extremity) [E11.42] 11/19/2014  . Generalized anxiety disorder [F41.1] 11/19/2014  . History of panic attacks [Z86.59] 11/19/2014  . History of tobacco abuse [Z87.891] 11/19/2014  . GERD (gastroesophageal reflux disease) [K21.9] 11/19/2014  . Non-insulin dependent type 2 diabetes mellitus ([place]) [E11.9] 11/19/2014  . Coccygeal pain [M53.3] 11/19/2014  . History of migraine [Z86.69] 11/19/2014  . Chronic constipation [K59.09] 10/24/2013  . Personal history of other diseases of the digestive system [Z87.19] 10/24/2013  . Leg length inequality [M21.70] 09/12/2012  . Morbid obesity ([place]) [E66.01] 05/26/2012  . Hypertension [I10]   . IBS (irritable bowel syndrome) [K58.9]   . Depression, major, recurrent, moderate ([place]) [F33.1]   . ADVERSE DRUG REACTION [T88.7XXA] 12/15/2009   Total Time spent with patient: 15 minutes  Past Psychiatric History:  Past Medical History:  Past Medical History:  Diagnosis Date  . Anxiety   . Chronic pain    previously saw Dr. Consuela Mimes in pain clinic, then saw pain specialist in [place]  .  Depression   . Diabetes mellitus (  [place])   . Frequency of urination   . GERD (gastroesophageal reflux disease)   . Headache(784.0)   . High cholesterol   . Hypertension   . IBS (irritable bowel syndrome)   . Left ankle instability   . Left knee DJD   . Lumbar Degenerative Disc Disease of  10/11/2014  . Neuromuscular disorder ([place])   . Osteoarthritis of hip (Right) 05/05/2015  . Other enthesopathy of ankle and tarsus 12/15/2009   Qualifier: Diagnosis of  By: Oneida Alar MD, KARL    . Peripheral sensory neuropathy (Bilateral) 11/19/2014  . Postoperative nausea and vomiting     Past Surgical History:  Procedure Laterality Date  . ABDOMINAL HYSTERECTOMY    . ANKLE SURGERY    . APPENDECTOMY    . COLONOSCOPY  2013  . ESOPHAGOGASTRODUODENOSCOPY (EGD) WITH PROPOFOL N/A 10/03/2014   Procedure: ESOPHAGOGASTRODUODENOSCOPY (EGD) WITH PROPOFOL;  Surgeon: Josefine Class, MD;  [facility];  Service: Endoscopy;  Laterality: N/A;  . KNEE ARTHROSCOPY  1997   left knee  . LEFT HEART CATHETERIZATION WITH CORONARY ANGIOGRAM N/A 01/11/2013   Procedure: LEFT HEART CATHETERIZATION WITH CORONARY ANGIOGRAM;  Surgeon: Sinclair Grooms, MD;  [facility] CATH LAB;  Service: Cardiovascular;  Laterality: N/A;  . TOTAL KNEE ARTHROPLASTY  08/30/2011   Procedure: TOTAL KNEE ARTHROPLASTY;  Surgeon: Lorn Junes, MD;  Location: [place];  Service: Orthopedics;  Laterality: Left;   Family History:  Family History  Problem Relation Age of Onset  . Cancer Mother   . Hypertension Father   . Heart disease Father    Family Psychiatric  History:  Social History:  Social History   Substance and Sexual Activity  Alcohol Use No  . Alcohol/week: 0.0 oz     Social History   Substance and Sexual Activity  Drug Use No    Social History   Socioeconomic History  . Marital status: Divorced    Spouse name: None  . Number of children: 2  . Years of education: 25  . Highest education level: None  Social  Needs  . Financial resource strain: None  . Food insecurity - worry: None  . Food insecurity - inability: None  . Transportation needs - medical: None  . Transportation needs - non-medical: None  Occupational History    Employer: [employer]  Tobacco Use  . Smoking status: Former Smoker    Packs/day: 2.00    Years: 15.00    Pack years: 30.00    Types: Cigarettes    Last attempt to quit: 02/09/1995    Years since quitting: 22.0  . Smokeless tobacco: Never Used  Substance and Sexual Activity  . Alcohol use: No    Alcohol/week: 0.0 oz  . Drug use: No  . Sexual activity: Not Currently  Other Topics Concern  . None  Social History Narrative   Patient lives at home alone. Patient works at [employer].   Caffeine daily- 2   Right handed.   Education- 11 th grade   Additional Social History:                         Sleep: Fair  Appetite:  Fair  Current Medications: Current Facility-Administered Medications  Medication Dose Route Frequency Provider Last Rate Last Dose  . acetaminophen (TYLENOL) tablet 650 mg  650 mg Oral Q6H PRN Rankin, Shuvon B, NP   650 mg at 03/03/17 1825  . albuterol (PROVENTIL HFA;VENTOLIN HFA) 108 (90 Base)  MCG/ACT inhaler 2 puff  2 puff Inhalation Q6H PRN Rankin, Shuvon B, NP      . alum & mag hydroxide-simeth (MAALOX/MYLANTA) 200-200-20 MG/5ML suspension 30 mL  30 mL Oral Q4H PRN Rankin, Shuvon B, NP      . diphenhydrAMINE (BENADRYL) capsule 50 mg  50 mg Oral QHS PRN Pennelope Bracken, MD      . haloperidol (HALDOL) tablet 5 mg  5 mg Oral BID Pennelope Bracken, MD   5 mg at 03/04/17 2308  . haloperidol (HALDOL) tablet 5 mg  5 mg Oral Q8H PRN Pennelope Bracken, MD       Or  . haloperidol lactate (HALDOL) injection 5 mg  5 mg Intramuscular Q8H PRN Pennelope Bracken, MD      . hydrOXYzine (ATARAX/VISTARIL) tablet 50 mg  50 mg Oral Q6H PRN Pennelope Bracken, MD      . magnesium hydroxide (MILK OF MAGNESIA)  suspension 30 mL  30 mL Oral Daily PRN Rankin, Shuvon B, NP   30 mL at 03/04/17 1115  . metFORMIN (GLUCOPHAGE) tablet 500 mg  500 mg Oral BID WC Rankin, Shuvon B, NP   500 mg at 03/04/17 1712  . senna (SENOKOT) tablet 17.2 mg  2 tablet Oral BID Pennelope Bracken, MD   17.2 mg at 03/04/17 1714  . topiramate (TOPAMAX) tablet 200 mg  200 mg Oral BID Rankin, Shuvon B, NP   200 mg at 03/04/17 1712  . traMADol (ULTRAM) tablet 100 mg  100 mg Oral Q8H PRN Lindon Romp A, NP   100 mg at 03/04/17 1614  . verapamil (CALAN-SR) CR tablet 120 mg  120 mg Oral QHS Rankin, Shuvon B, NP   120 mg at 03/04/17 2308    Lab Results:  Results for orders placed or performed during the hospital encounter of 03/02/17 (from the past 48 hour(s))  CBG monitoring, ED     Status: Abnormal   Collection Time: 03/03/17 10:36 AM  Result Value Ref Range   Glucose-Capillary 220 (H) 65 - 99 mg/dL    Blood Alcohol level:  Lab Results  Component Value Date   ETH <10 12/05/2534    Metabolic Disorder Labs: Lab Results  Component Value Date   HGBA1C 5.4 05/17/2015   MPG 108 05/17/2015   MPG 128 (H) 06/19/2012   No results found for: PROLACTIN Lab Results  Component Value Date   CHOL 190 05/17/2015   TRIG 81 05/17/2015   HDL 81 05/17/2015   CHOLHDL 2.3 05/17/2015   VLDL 16 05/17/2015   LDLCALC 93 05/17/2015    Physical Findings: AIMS: Facial and Oral Movements Muscles of Facial Expression: None, normal Lips and Perioral Area: None, normal Jaw: None, normal Tongue: None, normal,Extremity Movements Upper (arms, wrists, hands, fingers): None, normal Lower (legs, knees, ankles, toes): None, normal, Trunk Movements Neck, shoulders, hips: None, normal, Overall Severity Severity of abnormal movements (highest score from questions above): None, normal Incapacitation due to abnormal movements: None, normal Patient's awareness of abnormal movements (rate only patient's report): No Awareness, Dental  Status Current problems with teeth and/or dentures?: No Does patient usually wear dentures?: No  CIWA:    COWS:     Musculoskeletal: Strength & Muscle Tone: within normal limits Gait & Station: unsteady- Dx with leg length inequality  Patient leans: N/A  Psychiatric Specialty Exam: Physical Exam  Nursing note and vitals reviewed. Constitutional: She appears well-developed.  Psychiatric: She has a normal mood and affect. Her behavior is  normal.    Review of Systems  Psychiatric/Behavioral: Positive for hallucinations. The patient is nervous/anxious.   All other systems reviewed and are negative.   Blood pressure 128/72, pulse (!) 101, temperature 98 F (36.7 C), temperature source Oral, resp. rate 20, height 5' 5.5" (1.664 m), weight 103.4 kg (228 lb).Body mass index is 37.36 kg/m.  General Appearance: Casual  Eye Contact:  Fair  Speech:  Clear and Coherent  Volume:  Normal  Mood:  Euthymic  Affect:  Congruent  Thought Process:  Coherent  Orientation:  Full (Time, Place, and Person)  Thought Content:  Hallucinations: Auditory  Suicidal Thoughts:  No  Homicidal Thoughts:  No  Memory:  Immediate;   Fair Recent;   Fair Remote;   Fair  Judgement:  Fair  Insight:  Present  Psychomotor Activity:  Normal  Concentration:  Concentration: Fair  Recall:  [employer] of Knowledge:  Fair  Language:  Fair  Akathisia:  No  Handed:  Right  AIMS (if indicated):     Assets:  Communication Skills Desire for Improvement Resilience  ADL's:  Intact  Cognition:  WNL  Sleep:  Number of Hours: 6.25     Treatment Plan Summary: Daily contact with patient to assess and evaluate symptoms and progress in treatment and Medication management   Continue with current treatment plan on 03/05/2017 listed below, except where noted  Mood stablizaition  Continue with Haldol 5 mg PO BID  Insomnia   Continue with benadryl 50 PRN QHS HTN:   Continue verapamil 120 mg    Will continue to  monitor vitals ,medication compliance and treatment side effects while patient is here.  Reviewed labs; ,BAL -UDS - pos for opiates  -TSH, A1C, Lipid Panel, prolcation and EkG- pending results for 03/06/2017 collection   CSW will start working on disposition.  Patient to participate in therapeutic milieu  Derrill Center, NP 03/05/2017, 9:03 AM   Agree with NP Progress Note

## 2017-03-06 LAB — LIPID PANEL
CHOLESTEROL: 175 mg/dL (ref 0–200)
HDL: 69 mg/dL (ref >40)
LDL Cholesterol: 59 mg/dL (ref 0–99)
Total CHOL/HDL Ratio: 2.5 RATIO
Triglycerides: 234 mg/dL — ABNORMAL HIGH (ref <150)
VLDL: 47 mg/dL — ABNORMAL HIGH (ref 0–40)

## 2017-03-06 LAB — HEMOGLOBIN A1C
Hgb A1c MFr Bld: 6.2 % — ABNORMAL HIGH (ref 4.8–5.6)
MEAN PLASMA GLUCOSE: 131.24 mg/dL

## 2017-03-06 LAB — TSH: TSH: 1.289 u[IU]/mL (ref 0.350–4.500)

## 2017-03-06 MED ORDER — TOPIRAMATE 100 MG PO TABS
100.0000 mg | ORAL_TABLET | Freq: Two times a day (BID) | ORAL | Status: DC
  Administered 2017-03-06 – 2017-03-10 (×8): 100 mg via ORAL
  Filled 2017-03-06 (×10): qty 1

## 2017-03-06 NOTE — Progress Notes (Signed)
D. Pt presents with a flat affect and appropriate behavior. Pt reports that her new medication is making her drowsy. Pt resting in bed for much of the morning, but did attend group this am.  Pt currently denies SI/HI but endorses having 'ringing in her ears'. Per pt's self inventory, pt rates her depression, hopelessness and anxiety 0/0/0, respectively. A. Labs and vitals monitored. Pt compliant with medications. Pt supported emotionally and encouraged to express concerns and ask questions.   R. Pt remains safe with 15 minute checks. Will continue POC.

## 2017-03-06 NOTE — [facility] Group Notes (Signed)
[place] LCSW Group Therapy Note  Date/Time:  03/06/2017  11:00AM-12:00PM  Type of Therapy and Topic:  Group Therapy:  Music and Mood  Participation Level:  Minimal   Description of Group: In this process group, members listened to a variety of genres of music and identified that different types of music evoke different responses.  Patients were encouraged to identify music that was soothing for them and music that was energizing for them.  Patients discussed how this knowledge can help with wellness and recovery in various ways including managing depression and anxiety as well as encouraging healthy sleep habits.    Therapeutic Goals: 1. Patients will explore the impact of different varieties of music on mood 2. Patients will verbalize the thoughts they have when listening to different types of music 3. Patients will identify music that is soothing to them as well as music that is energizing to them 4. Patients will discuss how to use this knowledge to assist in maintaining wellness and recovery 5. Patients will explore the use of music as a coping skill  Summary of Patient Progress:  At the beginning of group, was not yet present, but joined the group with about 25 minutes to go.  She stated she was feeling pretty relaxed before coming to group and was able to relax with the music even more.  Therapeutic Modalities: Solution Focused Brief Therapy Activity   Selmer Dominion, LCSW

## 2017-03-06 NOTE — Progress Notes (Signed)
Adult Psychoeducational Group Note  Date:  03/06/2017 Time:  11:04 PM  Group Topic/Focus:  Wrap-Up Group:   The focus of this group is to help patients review their daily goal of treatment and discuss progress on daily workbooks.  Participation Level:  Minimal  Participation Quality:  Appropriate  Affect:  Appropriate  Cognitive:  Appropriate  Insight: Appropriate and Improving  Engagement in Group:  Engaged  Modes of Intervention:  Education  Additional Comments: Pt stated her goal for today was to interact with peers more and not sleep as much. Pt stated she felt that she achieved her goal today. Pt rated her over all day a 7 out of 10. Pt stated she attend all groups held today.  Candy Sledge 03/06/2017, 11:04 PM

## 2017-03-06 NOTE — Progress Notes (Signed)
Writer spoke with patient 1:1 in her room where she was observed lying down resting after group tonight. Writer asked how her day had been and she reported that she has slept most of the day. Writer encouraged her to be careful or use call bell if needing assistance getting up over in the night. She attended group and had snack before returning to her room. Safety maintained on unit with 15 min checks.

## 2017-03-06 NOTE — Progress Notes (Addendum)
[facility] MD Progress Note  03/06/2017 9:20 AM Heather Haley  MRN:  [id number]   Subjective:  Heather Haley reports " this new medication is making me sleepy"   Objective: Heather Haley seen attending group sessions. Continues to report mild electrical impulse and clicking sounds. continues to reports auditory hallucination of "clicks and screaming sounds reports I can ask it a question and it will answer." states the sounds are improving with medications. Denies suicidal or homicidal ideation during this assessment. Heather Haley continues to  presents with delusion thoughts and states she is resting all day and night.  Patient reports she has white matter in her brain and would like to have her Topamax decreased to 100mg s for her migraine headaches. Support, encouragement and reassurance was provided.   Principal Problem: MDD (major depressive disorder), recurrent, severe, with psychosis ([place]) Diagnosis:   Patient Active Problem List   Diagnosis Date Noted  . MDD (major depressive disorder), recurrent, severe, with psychosis ([place]) [F33.3] 03/03/2017  . Contracture, tendon sheath [M62.40] 03/23/2016  . Dysuria [R30.0] 06/17/2015  . Pure hypercholesterolemia [E78.00] 05/29/2015  . Opioid-induced constipation (OIC) [K59.03, T40.2X5A] 05/19/2015  . Osteoarthritis of hip (Right) [M16.11] 05/08/2015  . Chronic hip pain (Right) [M25.551, G89.29] 05/05/2015  . Long term prescription opiate use [Z79.891] 05/05/2015  . Chronic knee pain (S/P TKR:Total Knee Replacement) (Left) [M19.622, G89.29] 05/05/2015  . Osteoarthritis of hips (Location of Secondary source of pain) (Bilateral) (Right) [M16.0] 05/05/2015  . History of total knee replacement (Left) [Z96.659] 05/05/2015  . Chronic groin pain (Location of Secondary source of pain) (Right) [R10.31, G89.29] 05/05/2015  . Chronic lower extremity pain (Location of Tertiary source of pain) (Bilateral) (R>L) [M79.606, G89.29] 05/05/2015  . Chronic pain [G89.29]  02/05/2015  . Chronic ankle pain (secondary to a work-related injury) (Date of injury 04/17/2006) (Left) [W97.989, G89.29] 12/25/2014  . Chronic low back pain (Location of Primary Pain) (Bilateral) (R>L) [M54.5, G89.29] 12/25/2014  . Lumbar spondylosis (L3-4 & L4-5) [Q11.941] 12/25/2014  . Lumbar facet syndrome (Location of Primary Source of Pain) (Bilateral) (R>L) [M47.816] 12/25/2014  . Encounter for therapeutic drug level monitoring [Z51.81] 11/19/2014  . Encounter for long-term opiate analgesic use [Z79.891] 11/19/2014  . Long-term current use of opiate analgesic [Z79.891] 11/19/2014  . Uncomplicated opioid dependence ([place]) [F11.20] 11/19/2014  . Opiate use (35 MME/Day) [F11.90] 11/19/2014  . Chronic pain syndrome [G89.4] 11/19/2014  . Diabetic sensory peripheral neuropathy (Bilateral Lower Extremity) [E11.42] 11/19/2014  . Generalized anxiety disorder [F41.1] 11/19/2014  . History of panic attacks [Z86.59] 11/19/2014  . History of tobacco abuse [Z87.891] 11/19/2014  . GERD (gastroesophageal reflux disease) [K21.9] 11/19/2014  . Non-insulin dependent type 2 diabetes mellitus ([place]) [E11.9] 11/19/2014  . Coccygeal pain [M53.3] 11/19/2014  . History of migraine [Z86.69] 11/19/2014  . Chronic constipation [K59.09] 10/24/2013  . Personal history of other diseases of the digestive system [Z87.19] 10/24/2013  . Leg length inequality [M21.70] 09/12/2012  . Morbid obesity ([place]) [E66.01] 05/26/2012  . Hypertension [I10]   . IBS (irritable bowel syndrome) [K58.9]   . Depression, major, recurrent, moderate ([place]) [F33.1]   . ADVERSE DRUG REACTION [T88.7XXA] 12/15/2009   Total Time spent with patient: 15 minutes  Past Psychiatric History:  Past Medical History:  Past Medical History:  Diagnosis Date  . Anxiety   . Chronic pain    previously saw Dr. Consuela Mimes in pain clinic, then saw pain specialist in [place]  . Depression   . Diabetes mellitus ([place])   . Frequency of urination   .  GERD (gastroesophageal reflux disease)   . Headache(784.0)   . High cholesterol   . Hypertension   . IBS (irritable bowel syndrome)   . Left ankle instability   . Left knee DJD   . Lumbar Degenerative Disc Disease of  10/11/2014  . Neuromuscular disorder ([place])   . Osteoarthritis of hip (Right) 05/05/2015  . Other enthesopathy of ankle and tarsus 12/15/2009   Qualifier: Diagnosis of  By: Oneida Alar MD, KARL    . Peripheral sensory neuropathy (Bilateral) 11/19/2014  . Postoperative nausea and vomiting     Past Surgical History:  Procedure Laterality Date  . ABDOMINAL HYSTERECTOMY    . ANKLE SURGERY    . APPENDECTOMY    . COLONOSCOPY  2013  . ESOPHAGOGASTRODUODENOSCOPY (EGD) WITH PROPOFOL N/A 10/03/2014   Procedure: ESOPHAGOGASTRODUODENOSCOPY (EGD) WITH PROPOFOL;  Surgeon: Josefine Class, MD;  Location: [facility] ENDOSCOPY;  Service: Endoscopy;  Laterality: N/A;  . KNEE ARTHROSCOPY  1997   left knee  . LEFT HEART CATHETERIZATION WITH CORONARY ANGIOGRAM N/A 01/11/2013   Procedure: LEFT HEART CATHETERIZATION WITH CORONARY ANGIOGRAM;  Surgeon: Sinclair Grooms, MD;  Location: [facility] CATH LAB;  Service: Cardiovascular;  Laterality: N/A;  . TOTAL KNEE ARTHROPLASTY  08/30/2011   Procedure: TOTAL KNEE ARTHROPLASTY;  Surgeon: Lorn Junes, MD;  Location: [place];  Service: Orthopedics;  Laterality: Left;   Family History:  Family History  Problem Relation Age of Onset  . Cancer Mother   . Hypertension Father   . Heart disease Father    Family Psychiatric  History:  Social History:  Social History   Substance and Sexual Activity  Alcohol Use No  . Alcohol/week: 0.0 oz     Social History   Substance and Sexual Activity  Drug Use No    Social History   Socioeconomic History  . Marital status: Divorced    Spouse name: None  . Number of children: 2  . Years of education: 8  . Highest education level: None  Social Needs  . Financial resource strain: None  . Food insecurity - worry:  None  . Food insecurity - inability: None  . Transportation needs - medical: None  . Transportation needs - non-medical: None  Occupational History    Employer: [employer]  Tobacco Use  . Smoking status: Former Smoker    Packs/day: 2.00    Years: 15.00    Pack years: 30.00    Types: Cigarettes    Last attempt to quit: 02/09/1995    Years since quitting: 22.0  . Smokeless tobacco: Never Used  Substance and Sexual Activity  . Alcohol use: No    Alcohol/week: 0.0 oz  . Drug use: No  . Sexual activity: Not Currently  Other Topics Concern  . None  Social History Narrative   Patient lives at home alone. Patient works at [employer].   Caffeine daily- 2   Right handed.   Education- 11 th grade   Additional Social History:                         Sleep: Fair  Appetite:  Fair  Current Medications: Current Facility-Administered Medications  Medication Dose Route Frequency Provider Last Rate Last Dose  . acetaminophen (TYLENOL) tablet 650 mg  650 mg Oral Q6H PRN Rankin, Shuvon B, NP   650 mg at 03/03/17 1825  . albuterol (PROVENTIL HFA;VENTOLIN HFA) 108 (90 Base) MCG/ACT inhaler 2 puff  2 puff Inhalation Q6H PRN  Rankin, Shuvon B, NP      . alum & mag hydroxide-simeth (MAALOX/MYLANTA) 200-200-20 MG/5ML suspension 30 mL  30 mL Oral Q4H PRN Rankin, Shuvon B, NP      . diphenhydrAMINE (BENADRYL) capsule 50 mg  50 mg Oral QHS PRN Pennelope Bracken, MD      . haloperidol (HALDOL) tablet 5 mg  5 mg Oral BID Pennelope Bracken, MD   5 mg at 03/06/17 0737  . haloperidol (HALDOL) tablet 5 mg  5 mg Oral Q8H PRN Pennelope Bracken, MD       Or  . haloperidol lactate (HALDOL) injection 5 mg  5 mg Intramuscular Q8H PRN Pennelope Bracken, MD      . hydrOXYzine (ATARAX/VISTARIL) tablet 50 mg  50 mg Oral Q6H PRN Pennelope Bracken, MD      . magnesium hydroxide (MILK OF MAGNESIA) suspension 30 mL  30 mL Oral Daily PRN Rankin, Shuvon B, NP   30 mL at  03/04/17 1115  . metFORMIN (GLUCOPHAGE) tablet 500 mg  500 mg Oral BID WC Rankin, Shuvon B, NP   500 mg at 03/06/17 0736  . senna (SENOKOT) tablet 17.2 mg  2 tablet Oral BID Pennelope Bracken, MD   17.2 mg at 03/06/17 0736  . topiramate (TOPAMAX) tablet 200 mg  200 mg Oral BID Rankin, Shuvon B, NP   200 mg at 03/06/17 0735  . traMADol (ULTRAM) tablet 100 mg  100 mg Oral Q8H PRN Lindon Romp A, NP   100 mg at 03/06/17 0735  . verapamil (CALAN-SR) CR tablet 120 mg  120 mg Oral QHS Rankin, Shuvon B, NP   120 mg at 03/05/17 2132    Lab Results:  Results for orders placed or performed during the hospital encounter of 03/03/17 (from the past 48 hour(s))  Lipid panel     Status: Abnormal   Collection Time: 03/06/17  6:25 AM  Result Value Ref Range   Cholesterol 175 0 - 200 mg/dL   Triglycerides 234 (H) <150 mg/dL   HDL 69 >40 mg/dL   Total CHOL/HDL Ratio 2.5 RATIO   VLDL 47 (H) 0 - 40 mg/dL   LDL Cholesterol 59 0 - 99 mg/dL    Comment:        Total Cholesterol/HDL:CHD Risk Coronary Heart Disease Risk Table                     Men   Women  1/2 Average Risk   3.4   3.3  Average Risk       5.0   4.4  2 X Average Risk   9.6   7.1  3 X Average Risk  23.4   11.0        Use the calculated Patient Ratio above and the CHD Risk Table to determine the patient's CHD Risk.        ATP III CLASSIFICATION (LDL):  <100     mg/dL   Optimal  100-129  mg/dL   Near or Above                    Optimal  130-159  mg/dL   Borderline  160-189  mg/dL   High  >190     mg/dL   Very High Performed at [facility] [address] [place], [place] [address]   TSH     Status: None   Collection Time: 03/06/17  6:25 AM  Result  Value Ref Range   TSH 1.289 0.350 - 4.500 uIU/mL    Comment: Performed by a 3rd Generation assay with a functional sensitivity of <=0.01 uIU/mL. Performed at [facility], [place] [address] [place], [place] [address]     Blood Alcohol level:   Lab Results  Component Value Date   ETH <10 77/82/4235    Metabolic Disorder Labs: Lab Results  Component Value Date   HGBA1C 5.4 05/17/2015   MPG 108 05/17/2015   MPG 128 (H) 06/19/2012   No results found for: PROLACTIN Lab Results  Component Value Date   CHOL 175 03/06/2017   TRIG 234 (H) 03/06/2017   HDL 69 03/06/2017   CHOLHDL 2.5 03/06/2017   VLDL 47 (H) 03/06/2017   LDLCALC 59 03/06/2017   LDLCALC 93 05/17/2015    Physical Findings: AIMS: Facial and Oral Movements Muscles of Facial Expression: None, normal Lips and Perioral Area: None, normal Jaw: None, normal Tongue: None, normal,Extremity Movements Upper (arms, wrists, hands, fingers): None, normal Lower (legs, knees, ankles, toes): None, normal, Trunk Movements Neck, shoulders, hips: None, normal, Overall Severity Severity of abnormal movements (highest score from questions above): None, normal Incapacitation due to abnormal movements: None, normal Patient's awareness of abnormal movements (rate only patient's report): No Awareness, Dental Status Current problems with teeth and/or dentures?: No Does patient usually wear dentures?: No  CIWA:    COWS:     Musculoskeletal: Strength & Muscle Tone: within normal limits Gait & Station: unsteady- Dx with leg length inequality  Patient leans: N/A  Psychiatric Specialty Exam: Physical Exam  Nursing note and vitals reviewed. Constitutional: She appears well-developed.  Psychiatric: She has a normal mood and affect. Her behavior is normal.    Review of Systems  Psychiatric/Behavioral: Positive for hallucinations. The patient is nervous/anxious.   All other systems reviewed and are negative.   Blood pressure 103/72, pulse 96, temperature (!) 97.5 F (36.4 C), temperature source Oral, resp. rate 20, height 5' 5.5" (1.664 m), weight 103.4 kg (228 lb).Body mass index is 37.36 kg/m.  General Appearance: Casual  Eye Contact:  Fair  Speech:  Clear and Coherent   Volume:  Normal  Mood:  Euthymic  Affect:  Congruent  Thought Process:  Coherent  Orientation:  Full (Time, Place, and Person)  Thought Content:  Hallucinations: Auditory, Paranoid Ideation and Rumination  Suicidal Thoughts:  No  Homicidal Thoughts:  No  Memory:  Immediate;   Fair Recent;   Fair Remote;   Fair  Judgement:  Fair  Insight:  Present  Psychomotor Activity:  Normal  Concentration:  Concentration: Fair  Recall:  [employer] of Knowledge:  Fair  Language:  Fair  Akathisia:  No  Handed:  Right  AIMS (if indicated):     Assets:  Communication Skills Desire for Improvement Resilience  ADL's:  Intact  Cognition:  WNL  Sleep:  Number of Hours: 6.5     Treatment Plan Summary: Daily contact with patient to assess and evaluate symptoms and progress in treatment and Medication management   Continue with current treatment plan on 03/06/2017 listed below, except where noted  Mood stabilization  Continue with Haldol 5 mg PO BID   Decreased Topamax 200 mg to 100 mg PO BID  Insomnia   Continue with benadryl 50 PRN QHS HTN:   Continue verapamil 120 mg    Will continue to monitor vitals ,medication compliance and treatment side effects while patient is here.  Reviewed labs; ,BAL -UDS -  pos for opiates  -TSH, A1C, Lipid Panel, prolcation and EkG- pending results for 03/06/2017 collection   CSW will start working on disposition.  Patient to participate in therapeutic milieu  Derrill [place], NP 03/06/2017, 9:20 AM Agree with NP Progress Note

## 2017-03-07 LAB — PROLACTIN: Prolactin: 64.2 ng/mL — ABNORMAL HIGH (ref 4.8–23.3)

## 2017-03-07 MED ORDER — FLUPHENAZINE HCL 5 MG PO TABS
5.0000 mg | ORAL_TABLET | Freq: Two times a day (BID) | ORAL | Status: DC
  Administered 2017-03-07 – 2017-03-08 (×2): 5 mg via ORAL
  Filled 2017-03-07 (×4): qty 1

## 2017-03-07 NOTE — Progress Notes (Signed)
Adult Psychoeducational Group Note  Date:  03/07/2017 Time:  11:14 AM  Group Topic/Focus:  Goals Group:   The focus of this group is to help patients establish daily goals to achieve during treatment and discuss how the patient can incorporate goal setting into their daily lives to aide in recovery.  Participation Level:  Active  Participation Quality:  Appropriate  Affect:  Appropriate  Cognitive:  Appropriate  Insight: Appropriate and Good  Engagement in Group:  Engaged  Modes of Intervention:  Clarification, Discussion and Education  Additional Comments:  Pt attended goals/orientation group this morning and participated in group. Pt goal for today is to follow up with the psychiatrist about discharge plans and upcoming appointments with medical physician. Staff was able to discuss the treatment plan, safety, and rules for the unit. Pt was in engaged and appropriate in group. Pt denies SI/HI at this time. Pt rated her day 9/10.  Kiele Heavrin A 03/07/2017, 11:14 AM

## 2017-03-07 NOTE — [facility] Group Notes (Signed)
[facility] LCSW Group Therapy Note  Date/Time: 03/07/17, 1315  Type of Therapy and Topic:  Group Therapy:  Overcoming Obstacles  Participation Level:  minimal  Description of Group:    In this group patients will be encouraged to explore what they see as obstacles to their own wellness and recovery. They will be guided to discuss their thoughts, feelings, and behaviors related to these obstacles. The group will process together ways to cope with barriers, with attention given to specific choices patients can make. Each patient will be challenged to identify changes they are motivated to make in order to overcome their obstacles. This group will be process-oriented, with patients participating in exploration of their own experiences as well as giving and receiving support and challenge from other group members.  Therapeutic Goals: 1. Patient will identify personal and current obstacles as they relate to admission. 2. Patient will identify barriers that currently interfere with their wellness or overcoming obstacles.  3. Patient will identify feelings, thought process and behaviors related to these barriers. 4. Patient will identify two changes they are willing to make to overcome these obstacles:    Summary of Patient Progress: pt shared that finances are her current obstacle.  Pt was attentive during group but said very little during group discussion until CSW called on her.      Therapeutic Modalities:   Cognitive Behavioral Therapy Solution Focused Therapy Motivational Interviewing Relapse Prevention Therapy  Lurline Idol, LCSW

## 2017-03-07 NOTE — Progress Notes (Signed)
[facility] MD Progress Note  03/07/2017 2:46 PM Heather Haley  MRN:  [id number] Subjective:    Heather Haley is a 53 y/p F with history of MDD with psychotic features (and self reported history of schizophrenia) and multiple medical complaints who was admitted voluntarily from ED with worsening symptoms of AH, tactile hallucinations, delusions, depression, and anxiety. Pt reported that she has been feeling as if she has been electrocuted in her own home and she has been hearing AH of clicks which she able to communicate with. She also endorsed paranoia of being followed. Pt agreed to resume her home medications with addition of trial of haldol, and she reported improvement of AH and sensation of being electrocuted during her stay.  Today upon evaluation, pt shares, "You're knocking me out, man." Pt explains that she feels daytime drowsiness which she associates with her medication, as she is sleeping adequately. Topamax dose was reduced over the weekend, but pt states that she still feels fatigued. She reports AH have changed and worsened today, explaining, "The clicking has turned into screaming." Pt denies SI/HI/VH. She is sleeping well, but she feels she is sleeping too much. Discussed with patient about treatment options, and she agrees to discontinue haldol with plan to attempt trial of prolixin as it is an effective antipsychotic and available in an affordable long-acting injectable form. Pt was in agreement with the above plan, and she had no further questions, comments, or concerns.  Principal Problem: MDD (major depressive disorder), recurrent, severe, with psychosis ([place]) Diagnosis:   Patient Active Problem List   Diagnosis Date Noted  . MDD (major depressive disorder), recurrent, severe, with psychosis ([place]) [F33.3] 03/03/2017  . Contracture, tendon sheath [M62.40] 03/23/2016  . Dysuria [R30.0] 06/17/2015  . Pure hypercholesterolemia [E78.00] 05/29/2015  . Opioid-induced constipation  (OIC) [K59.03, T40.2X5A] 05/19/2015  . Osteoarthritis of hip (Right) [M16.11] 05/08/2015  . Chronic hip pain (Right) [M25.551, G89.29] 05/05/2015  . Long term prescription opiate use [Z79.891] 05/05/2015  . Chronic knee pain (S/P TKR:Total Knee Replacement) (Left) [K44.010, G89.29] 05/05/2015  . Osteoarthritis of hips (Location of Secondary source of pain) (Bilateral) (Right) [M16.0] 05/05/2015  . History of total knee replacement (Left) [Z96.659] 05/05/2015  . Chronic groin pain (Location of Secondary source of pain) (Right) [R10.31, G89.29] 05/05/2015  . Chronic lower extremity pain (Location of Tertiary source of pain) (Bilateral) (R>L) [M79.606, G89.29] 05/05/2015  . Chronic pain [G89.29] 02/05/2015  . Chronic ankle pain (secondary to a work-related injury) (Date of injury 04/17/2006) (Left) [U72.536, G89.29] 12/25/2014  . Chronic low back pain (Location of Primary Pain) (Bilateral) (R>L) [M54.5, G89.29] 12/25/2014  . Lumbar spondylosis (L3-4 & L4-5) [U44.034] 12/25/2014  . Lumbar facet syndrome (Location of Primary Source of Pain) (Bilateral) (R>L) [M47.816] 12/25/2014  . Encounter for therapeutic drug level monitoring [Z51.81] 11/19/2014  . Encounter for long-term opiate analgesic use [Z79.891] 11/19/2014  . Long-term current use of opiate analgesic [Z79.891] 11/19/2014  . Uncomplicated opioid dependence ([place]) [F11.20] 11/19/2014  . Opiate use (35 MME/Day) [F11.90] 11/19/2014  . Chronic pain syndrome [G89.4] 11/19/2014  . Diabetic sensory peripheral neuropathy (Bilateral Lower Extremity) [E11.42] 11/19/2014  . Generalized anxiety disorder [F41.1] 11/19/2014  . History of panic attacks [Z86.59] 11/19/2014  . History of tobacco abuse [Z87.891] 11/19/2014  . GERD (gastroesophageal reflux disease) [K21.9] 11/19/2014  . Non-insulin dependent type 2 diabetes mellitus ([place]) [E11.9] 11/19/2014  . Coccygeal pain [M53.3] 11/19/2014  . History of migraine [Z86.69] 11/19/2014  . Chronic  constipation [K59.09] 10/24/2013  .  Personal history of other diseases of the digestive system [Z87.19] 10/24/2013  . Leg length inequality [M21.70] 09/12/2012  . Morbid obesity ([place]) [E66.01] 05/26/2012  . Hypertension [I10]   . IBS (irritable bowel syndrome) [K58.9]   . Depression, major, recurrent, moderate ([place]) [F33.1]   . ADVERSE DRUG REACTION [T88.7XXA] 12/15/2009   Total Time spent with patient: 30 minutes  Past Psychiatric History: see H&P  Past Medical History:  Past Medical History:  Diagnosis Date  . Anxiety   . Chronic pain    previously saw Dr. Consuela Mimes in pain clinic, then saw pain specialist in [place]  . Depression   . Diabetes mellitus ([place])   . Frequency of urination   . GERD (gastroesophageal reflux disease)   . Headache(784.0)   . High cholesterol   . Hypertension   . IBS (irritable bowel syndrome)   . Left ankle instability   . Left knee DJD   . Lumbar Degenerative Disc Disease of  10/11/2014  . Neuromuscular disorder ([place])   . Osteoarthritis of hip (Right) 05/05/2015  . Other enthesopathy of ankle and tarsus 12/15/2009   Qualifier: Diagnosis of  By: Oneida Alar MD, KARL    . Peripheral sensory neuropathy (Bilateral) 11/19/2014  . Postoperative nausea and vomiting     Past Surgical History:  Procedure Laterality Date  . ABDOMINAL HYSTERECTOMY    . ANKLE SURGERY    . APPENDECTOMY    . COLONOSCOPY  2013  . ESOPHAGOGASTRODUODENOSCOPY (EGD) WITH PROPOFOL N/A 10/03/2014   Procedure: ESOPHAGOGASTRODUODENOSCOPY (EGD) WITH PROPOFOL;  Surgeon: Josefine Class, MD;  Location: [facility] ENDOSCOPY;  Service: Endoscopy;  Laterality: N/A;  . KNEE ARTHROSCOPY  1997   left knee  . LEFT HEART CATHETERIZATION WITH CORONARY ANGIOGRAM N/A 01/11/2013   Procedure: LEFT HEART CATHETERIZATION WITH CORONARY ANGIOGRAM;  Surgeon: Sinclair Grooms, MD;  [facility];  Service: Cardiovascular;  Laterality: N/A;  . TOTAL KNEE ARTHROPLASTY  08/30/2011   Procedure: TOTAL KNEE  ARTHROPLASTY;  Surgeon: Lorn Junes, MD;  Location: [place];  Service: Orthopedics;  Laterality: Left;   Family History:  Family History  Problem Relation Age of Onset  . Cancer Mother   . Hypertension Father   . Heart disease Father    Family Psychiatric  History: see H&P Social History:  Social History   Substance and Sexual Activity  Alcohol Use No  . Alcohol/week: 0.0 oz     Social History   Substance and Sexual Activity  Drug Use No    Social History   Socioeconomic History  . Marital status: Divorced    Spouse name: None  . Number of children: 2  . Years of education: 67  . Highest education level: None  Social Needs  . Financial resource strain: None  . Food insecurity - worry: None  . Food insecurity - inability: None  . Transportation needs - medical: None  . Transportation needs - non-medical: None  Occupational History    Employer: [employer]  Tobacco Use  . Smoking status: Former Smoker    Packs/day: 2.00    Years: 15.00    Pack years: 30.00    Types: Cigarettes    Last attempt to quit: 02/09/1995    Years since quitting: 22.0  . Smokeless tobacco: Never Used  Substance and Sexual Activity  . Alcohol use: No    Alcohol/week: 0.0 oz  . Drug use: No  . Sexual activity: Not Currently  Other Topics Concern  . None  Social  History Narrative   Patient lives at home alone. Patient works at [employer].   Caffeine daily- 2   Right handed.   Education- 11 th grade   Additional Social History:                         Sleep: Good  Appetite:  Good  Current Medications: Current Facility-Administered Medications  Medication Dose Route Frequency Provider Last Rate Last Dose  . acetaminophen (TYLENOL) tablet 650 mg  650 mg Oral Q6H PRN Rankin, Shuvon B, NP   650 mg at 03/06/17 1831  . albuterol (PROVENTIL HFA;VENTOLIN HFA) 108 (90 Base) MCG/ACT inhaler 2 puff  2 puff Inhalation Q6H PRN Rankin, Shuvon B, NP      . alum & mag  hydroxide-simeth (MAALOX/MYLANTA) 200-200-20 MG/5ML suspension 30 mL  30 mL Oral Q4H PRN Rankin, Shuvon B, NP      . diphenhydrAMINE (BENADRYL) capsule 50 mg  50 mg Oral QHS PRN Pennelope Bracken, MD      . fluPHENAZine (PROLIXIN) tablet 5 mg  5 mg Oral BID Pennelope Bracken, MD      . haloperidol (HALDOL) tablet 5 mg  5 mg Oral Q8H PRN Pennelope Bracken, MD       Or  . haloperidol lactate (HALDOL) injection 5 mg  5 mg Intramuscular Q8H PRN Pennelope Bracken, MD      . hydrOXYzine (ATARAX/VISTARIL) tablet 50 mg  50 mg Oral Q6H PRN Pennelope Bracken, MD      . magnesium hydroxide (MILK OF MAGNESIA) suspension 30 mL  30 mL Oral Daily PRN Rankin, Shuvon B, NP   30 mL at 03/04/17 1115  . metFORMIN (GLUCOPHAGE) tablet 500 mg  500 mg Oral BID WC Rankin, Shuvon B, NP   500 mg at 03/07/17 0814  . senna (SENOKOT) tablet 17.2 mg  2 tablet Oral BID Pennelope Bracken, MD   17.2 mg at 03/07/17 0814  . topiramate (TOPAMAX) tablet 100 mg  100 mg Oral BID Derrill Center, NP   100 mg at 03/07/17 0814  . traMADol (ULTRAM) tablet 100 mg  100 mg Oral Q8H PRN Lindon Romp A, NP   100 mg at 03/07/17 0919  . verapamil (CALAN-SR) CR tablet 120 mg  120 mg Oral QHS Rankin, Shuvon B, NP   120 mg at 03/06/17 2230    Lab Results:  Results for orders placed or performed during the hospital encounter of 03/03/17 (from the past 48 hour(s))  Prolactin     Status: Abnormal   Collection Time: 03/06/17  6:25 AM  Result Value Ref Range   Prolactin 64.2 (H) 4.8 - 23.3 ng/mL    Comment: (NOTE) Performed At: [facility] [place], [place] [id number] Rush Farmer MD [id number] Performed at [facility], [place] [address] [place], [place] [address]   Lipid panel     Status: Abnormal   Collection Time: 03/06/17  6:25 AM  Result Value Ref Range   Cholesterol 175 0 - 200 mg/dL   Triglycerides 234 (H) <150 mg/dL   HDL 69 >40 mg/dL   Total  CHOL/HDL Ratio 2.5 RATIO   VLDL 47 (H) 0 - 40 mg/dL   LDL Cholesterol 59 0 - 99 mg/dL    Comment:        Total Cholesterol/HDL:CHD Risk Coronary Heart Disease Risk Table  Men   Women  1/2 Average Risk   3.4   3.3  Average Risk       5.0   4.4  2 X Average Risk   9.6   7.1  3 X Average Risk  23.4   11.0        Use the calculated Patient Ratio above and the CHD Risk Table to determine the patient's CHD Risk.        ATP III CLASSIFICATION (LDL):  <100     mg/dL   Optimal  100-129  mg/dL   Near or Above                    Optimal  130-159  mg/dL   Borderline  160-189  mg/dL   High  >190     mg/dL   Very High Performed at [facility] [address] [place], [place] [address]   Hemoglobin A1c     Status: Abnormal   Collection Time: 03/06/17  6:25 AM  Result Value Ref Range   Hgb A1c MFr Bld 6.2 (H) 4.8 - 5.6 %    Comment: (NOTE) Pre diabetes:          5.7%-6.4% Diabetes:              >6.4% Glycemic control for   <7.0% adults with diabetes    Mean Plasma Glucose 131.24 mg/dL    Comment: Performed at [facility] [address], [place], [place] [address]  TSH     Status: None   Collection Time: 03/06/17  6:25 AM  Result Value Ref Range   TSH 1.289 0.350 - 4.500 uIU/mL    Comment: Performed by a 3rd Generation assay with a functional sensitivity of <=0.01 uIU/mL. Performed at [facility], [place] [address], [place],  [address]     Blood Alcohol level:  Lab Results  Component Value Date   ETH <10 93/81/8299    Metabolic Disorder Labs: Lab Results  Component Value Date   HGBA1C 6.2 (H) 03/06/2017   MPG 131.24 03/06/2017   MPG 108 05/17/2015   Lab Results  Component Value Date   PROLACTIN 64.2 (H) 03/06/2017   Lab Results  Component Value Date   CHOL 175 03/06/2017   TRIG 234 (H) 03/06/2017   HDL 69 03/06/2017   CHOLHDL 2.5 03/06/2017   VLDL 47 (H) 03/06/2017   LDLCALC 59 03/06/2017    LDLCALC 93 05/17/2015    Physical Findings: AIMS: Facial and Oral Movements Muscles of Facial Expression: None, normal Lips and Perioral Area: None, normal Jaw: None, normal Tongue: None, normal,Extremity Movements Upper (arms, wrists, hands, fingers): None, normal Lower (legs, knees, ankles, toes): None, normal, Trunk Movements Neck, shoulders, hips: None, normal, Overall Severity Severity of abnormal movements (highest score from questions above): None, normal Incapacitation due to abnormal movements: None, normal Patient's awareness of abnormal movements (rate only patient's report): No Awareness, Dental Status Current problems with teeth and/or dentures?: No Does patient usually wear dentures?: No  CIWA:    COWS:     Musculoskeletal: Strength & Muscle Tone: within normal limits Gait & Station: normal Patient leans: N/A  Psychiatric Specialty Exam: Physical Exam  Nursing note and vitals reviewed.   Review of Systems  Constitutional: Positive for malaise/fatigue. Negative for chills and fever.  Respiratory: Negative for cough and shortness of breath.   Cardiovascular: Negative for chest pain.  Gastrointestinal: Negative for abdominal pain, heartburn, nausea and  vomiting.  Psychiatric/Behavioral: Positive for hallucinations. Negative for depression and suicidal ideas. The patient is nervous/anxious.     Blood pressure 107/78, pulse 91, temperature 97.7 F (36.5 C), resp. rate 16, height 5' 5.5" (1.664 m), weight 103.4 kg (228 lb).Body mass index is 37.36 kg/m.  General Appearance: Casual and Fairly Groomed  Eye Contact:  Good  Speech:  Clear and Coherent and Normal Rate  Volume:  Normal  Mood:  Anxious and Depressed  Affect:  Appropriate, Congruent and Constricted  Thought Process:  Coherent and Goal Directed  Orientation:  Full (Time, Place, and Person)  Thought Content:  Hallucinations: Auditory  Suicidal Thoughts:  No  Homicidal Thoughts:  No  Memory:  Immediate;    Fair Recent;   Fair Remote;   Fair  Judgement:  Fair  Insight:  Fair  Psychomotor Activity:  Normal  Concentration:  Concentration: Fair  Recall:  [employer] of Knowledge:  Fair  Language:  Fair  Akathisia:  No  Handed:    AIMS (if indicated):     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 Pt reports worsened AH and daytime fatigue which she associates with haldol, and she agrees to change haldol to trial of prolixin today.  - Continue inpatient hospitalization  - MDD, recurrent, severe, with psychotic features vs primary psychotic disorder             - DC Haldol 5mg  po BID             - Start prolixin 5mg  po BID  - Anxiety              - Continue vistaril 50mg  po q6h prn anxiety  - insomnia             - Continuebenadryl 50mg  po qhs prn insomnia  - DMII             - Continue metformin 500mg  BID  - HTN             - Continue verapamil 120mg  qhs  - Migraine headaches             - Continue topamax 100mg  BID  - Chronic pain             - Continue tramadol 100mg  q8h prn pain  -encourage participation in groups and the therapeutic milieu  -Discharge planning will be ongoing     Pennelope Bracken, MD 03/07/2017, 2:46 PM

## 2017-03-07 NOTE — Plan of Care (Signed)
Patient is alert and oriented x 4.  Presents with calm affect and pleasant mood.  Reports hearing screaming sound in her head.  Denies suicidal thoughts and visual hallucinations.  Patient is safe and free from injury.  Compliant with her prescribed medications.  Attended group and participated.

## 2017-03-07 NOTE — Progress Notes (Signed)
Adult Psychoeducational Group Note  Date:  03/07/2017 Time:  3:45 PM  Group Topic/Focus:  Overcoming Stress:   The focus of this group is to define stress and help patients assess their triggers.  Participation Level:  Active  Participation Quality:  Appropriate and Attentive  Affect:  Appropriate  Cognitive:  Alert, Appropriate and Oriented  Insight: Appropriate and Good  Engagement in Group:  Engaged and Supportive  Modes of Intervention:  Discussion, Exploration and Orientation  Additional Comments:The purpose of this group is to educate and introduce patients to the benefits of aromatherapy.Pt was engaged and participated during this group.    Heather Haley Heather Haley 03/07/2017, 3:45 PM

## 2017-03-07 NOTE — Progress Notes (Signed)
Recreation Therapy Notes    Date: 03/07/17 Time: 1000 Location: 500 Hall Dayroom  Group Topic: Anger Management  Goal Area(s) Addresses:  Patient will identify triggers for anger.  Patient will identify physical reaction to anger.   Patient will identify benefit of using coping skills when angry.  Behavioral Response: Minimal, Attentive  Intervention: Worksheet, pencils  Activity: Introduction to Anger Management.  Patients were given a worksheet where they were to identify at least 3 situations/topics/people that lead to anger, how they react to anger and problems caused because of anger.  Patients were also asked to identify coping skills they use to deal with anger.  Education: Anger Management, Discharge Planning   Education Outcome: Acknowledges education/In group clarification offered/Needs additional education.   Clinical Observations/Feedback: Pt filled out her worksheet.  Pt didn't want to share what she wrote but was attentive to her peers as they spoke.   Victorino Sparrow, LRT/CTRS      Victorino Sparrow A 03/07/2017 12:10 PM

## 2017-03-07 NOTE — Progress Notes (Signed)
Writer spoke with patient 1:1 and she reported having had a good day. She requested her pain medication but was unable to receive it because it was too soon. She was informed of the next time medication is available and she was receptive.She reported that she will requst it at that time. Writer asked if she still heard the clicking sounds still and she reported that the clinking sounds are gone but she hears screaming in her ears. Writer asked how she was able to rest with the sounds of screaming in her ears and she replied that once she takes the haldol it puts her to sleep and she doesn't hear it. Writer encouraged her to inform her doctor of this issue. Safety maintained on unit with 15 min checks.

## 2017-03-07 NOTE — Progress Notes (Signed)
Nursing Progress Note [id number]  D) Patient presents with flat affect and is minimal with staff/peers this evening. Patient did attend group. Patient denies SI/HI but reports auditory hallucinations. Patient endorses L ankle pain and requests PRN Tramadol. Patient contracts for safety on the unit. Patient reports sleeping well with current regimen. Patient compliant with scheduled medications.   A) Emotional support given. 1:1 interaction and active listening provided. Patient medicated as prescribed. Medications and plan of care reviewed with patient. Patient verbalized understanding without further questions. Snacks and fluids provided. Opportunities for questions or concerns presented to patient. Patient encouraged to continue to work on treatment goals. Labs, vital signs and patient behavior monitored throughout shift. Patient safety maintained with q15 min safety checks. High fall risk precautions in place. Patient ambulating with ankle brace and tennis shoes for support per MD order.  R) Patient receptive to interaction with nurse. Patient remains safe on the unit at this time. Patient denies any adverse medication reactions at this time. Patient is resting in bed without complaints. Will continue to monitor.

## 2017-03-08 MED ORDER — FLUPHENAZINE HCL 2.5 MG PO TABS
7.5000 mg | ORAL_TABLET | Freq: Two times a day (BID) | ORAL | Status: DC
  Administered 2017-03-08: 7.5 mg via ORAL
  Filled 2017-03-08 (×4): qty 3

## 2017-03-08 NOTE — [facility] Group Notes (Signed)
[facility] LCSW Group Therapy Note  Date/Time: 03/08/17, 1115  Type of Therapy/Topic:  Group Therapy:  Feelings about Diagnosis  Participation Level:  Minimal   Mood:pleasant   Description of Group:    This group will allow patients to explore their thoughts and feelings about diagnoses they have received. Patients will be guided to explore their level of understanding and acceptance of these diagnoses. Facilitator will encourage patients to process their thoughts and feelings about the reactions of others to their diagnosis, and will guide patients in identifying ways to discuss their diagnosis with significant others in their lives. This group will be process-oriented, with patients participating in exploration of their own experiences as well as giving and receiving support and challenge from other group members.   Therapeutic Goals: 1. Patient will demonstrate understanding of diagnosis as evidence by identifying two or more symptoms of the disorder:  2. Patient will be able to express two feelings regarding the diagnosis 3. Patient will demonstrate ability to communicate their needs through discussion and/or role plays  Summary of Patient Progress: Pt was attentive during group but did not participate a lot.  Pt responded to CSW questions during a group discussion regarding specific symptoms of schizophrenia.        Therapeutic Modalities:   Cognitive Behavioral Therapy Brief Therapy Feelings Identification   Lurline Idol, LCSW

## 2017-03-08 NOTE — Progress Notes (Signed)
Recreation Therapy Notes  Date: 03/08/17 Time: 1000 Location: 500 Hall Dayroom  Group Topic: Coping Skills  Goal Area(s) Addresses:  Patient will be able to identify positive coping skills. Patient will be able to identify benefits of coping skills. Patient will be able to identify benefits of using coping skills post d/c.  Behavioral Response: Engaged  Intervention: Magazines, scissors, glue sticks, Architect paper, worksheet  Activity: Patients were given a worksheet divided into 5 categories (diversions, cognitive, social, tension release and physical).  Patients were to look through the magazines to find pictures to represent coping skills they could use for each category.  Education: Radiographer, therapeutic, Dentist.   Education Outcome: Acknowledges understanding/In group clarification offered/Needs additional education.   Clinical Observations/Feedback: Pt was quiet abut engaged when prompted. Pt stated for distraction pt stated going on a cruise; social- restaurants/bar; cognitive-cookouts; tension release - meeting with friends; and fruit for physical.  Pt stated using her coping skills will help her take that cruise.     Victorino Sparrow,  LRT/CTRS    Victorino Sparrow A 03/08/2017 11:33 AM

## 2017-03-08 NOTE — Progress Notes (Signed)
[facility] MD Progress Note  03/08/2017 12:26 PM Heather Haley  MRN:  [id number] Subjective:    Heather Haley is a 47 y/p F with history of MDD with psychotic features (and self reported history of schizophrenia) and multiple medical complaints who was admitted voluntarily from ED with worsening symptoms of AH, tactile hallucinations, delusions, depression, and anxiety. Pt reported feeling as if she has been electrocuted in her own home and she has been hearing AH of clicks which she able to communicate with. She also endorsed paranoia of being followed.Pt agreed to resume her home medications with addition of trial of haldol, but her symptoms of AH did not improve, so she was transitioned to prolixin yesterday.  Today upon evaluation, pt shares, "The squealing has come down some. It was 11 out of 10 yesterday and today it's an 8." She denies SI/HI/VH today. She feels that prolixin has been effective in starting to reduce her symptoms. She is sleeping well and her appetite is good. She feels less drowsy with use of prolixin as compared to haldol. Discussed with patient about increasing dose of prolixin, and she was in agreement. She had no further questions, comments, or concerns.  Principal Problem: MDD (major depressive disorder), recurrent, severe, with psychosis ([place]) Diagnosis:   Patient Active Problem List   Diagnosis Date Noted  . MDD (major depressive disorder), recurrent, severe, with psychosis ([place]) [F33.3] 03/03/2017  . Contracture, tendon sheath [M62.40] 03/23/2016  . Dysuria [R30.0] 06/17/2015  . Pure hypercholesterolemia [E78.00] 05/29/2015  . Opioid-induced constipation (OIC) [K59.03, T40.2X5A] 05/19/2015  . Osteoarthritis of hip (Right) [M16.11] 05/08/2015  . Chronic hip pain (Right) [M25.551, G89.29] 05/05/2015  . Long term prescription opiate use [Z79.891] 05/05/2015  . Chronic knee pain (S/P TKR:Total Knee Replacement) (Left) [J18.841, G89.29] 05/05/2015  . Osteoarthritis  of hips (Location of Secondary source of pain) (Bilateral) (Right) [M16.0] 05/05/2015  . History of total knee replacement (Left) [Z96.659] 05/05/2015  . Chronic groin pain (Location of Secondary source of pain) (Right) [R10.31, G89.29] 05/05/2015  . Chronic lower extremity pain (Location of Tertiary source of pain) (Bilateral) (R>L) [M79.606, G89.29] 05/05/2015  . Chronic pain [G89.29] 02/05/2015  . Chronic ankle pain (secondary to a work-related injury) (Date of injury 04/17/2006) (Left) [Y60.630, G89.29] 12/25/2014  . Chronic low back pain (Location of Primary Pain) (Bilateral) (R>L) [M54.5, G89.29] 12/25/2014  . Lumbar spondylosis (L3-4 & L4-5) [Z60.109] 12/25/2014  . Lumbar facet syndrome (Location of Primary Source of Pain) (Bilateral) (R>L) [M47.816] 12/25/2014  . Encounter for therapeutic drug level monitoring [Z51.81] 11/19/2014  . Encounter for long-term opiate analgesic use [Z79.891] 11/19/2014  . Long-term current use of opiate analgesic [Z79.891] 11/19/2014  . Uncomplicated opioid dependence ([place]) [F11.20] 11/19/2014  . Opiate use (35 MME/Day) [F11.90] 11/19/2014  . Chronic pain syndrome [G89.4] 11/19/2014  . Diabetic sensory peripheral neuropathy (Bilateral Lower Extremity) [E11.42] 11/19/2014  . Generalized anxiety disorder [F41.1] 11/19/2014  . History of panic attacks [Z86.59] 11/19/2014  . History of tobacco abuse [Z87.891] 11/19/2014  . GERD (gastroesophageal reflux disease) [K21.9] 11/19/2014  . Non-insulin dependent type 2 diabetes mellitus ([place]) [E11.9] 11/19/2014  . Coccygeal pain [M53.3] 11/19/2014  . History of migraine [Z86.69] 11/19/2014  . Chronic constipation [K59.09] 10/24/2013  . Personal history of other diseases of the digestive system [Z87.19] 10/24/2013  . Leg length inequality [M21.70] 09/12/2012  . Morbid obesity ([place]) [E66.01] 05/26/2012  . Hypertension [I10]   . IBS (irritable bowel syndrome) [K58.9]   . Depression, major, recurrent, moderate ([place])  [  F33.1]   . ADVERSE DRUG REACTION [T88.7XXA] 12/15/2009   Total Time spent with patient: 30 minutes  Past Psychiatric History: see H&P  Past Medical History:  Past Medical History:  Diagnosis Date  . Anxiety   . Chronic pain    previously saw Dr. Consuela Mimes in pain clinic, then saw pain specialist in [place]  . Depression   . Diabetes mellitus ([place])   . Frequency of urination   . GERD (gastroesophageal reflux disease)   . Headache(784.0)   . High cholesterol   . Hypertension   . IBS (irritable bowel syndrome)   . Left ankle instability   . Left knee DJD   . Lumbar Degenerative Disc Disease of  10/11/2014  . Neuromuscular disorder ([place])   . Osteoarthritis of hip (Right) 05/05/2015  . Other enthesopathy of ankle and tarsus 12/15/2009   Qualifier: Diagnosis of  By: Oneida Alar MD, KARL    . Peripheral sensory neuropathy (Bilateral) 11/19/2014  . Postoperative nausea and vomiting     Past Surgical History:  Procedure Laterality Date  . ABDOMINAL HYSTERECTOMY    . ANKLE SURGERY    . APPENDECTOMY    . COLONOSCOPY  2013  . ESOPHAGOGASTRODUODENOSCOPY (EGD) WITH PROPOFOL N/A 10/03/2014   Procedure: ESOPHAGOGASTRODUODENOSCOPY (EGD) WITH PROPOFOL;  Surgeon: Josefine Class, MD;  Location: [facility] ENDOSCOPY;  Service: Endoscopy;  Laterality: N/A;  . KNEE ARTHROSCOPY  1997   left knee  . LEFT HEART CATHETERIZATION WITH CORONARY ANGIOGRAM N/A 01/11/2013   Procedure: LEFT HEART CATHETERIZATION WITH CORONARY ANGIOGRAM;  Surgeon: Sinclair Grooms, MD;  Location: [facility] CATH LAB;  Service: Cardiovascular;  Laterality: N/A;  . TOTAL KNEE ARTHROPLASTY  08/30/2011   Procedure: TOTAL KNEE ARTHROPLASTY;  Surgeon: Lorn Junes, MD;  Location: [place];  Service: Orthopedics;  Laterality: Left;   Family History:  Family History  Problem Relation Age of Onset  . Cancer Mother   . Hypertension Father   . Heart disease Father    Family Psychiatric  History: see H&P Social History:  Social History    Substance and Sexual Activity  Alcohol Use No  . Alcohol/week: 0.0 oz     Social History   Substance and Sexual Activity  Drug Use No    Social History   Socioeconomic History  . Marital status: Divorced    Spouse name: None  . Number of children: 2  . Years of education: 100  . Highest education level: None  Social Needs  . Financial resource strain: None  . Food insecurity - worry: None  . Food insecurity - inability: None  . Transportation needs - medical: None  . Transportation needs - non-medical: None  Occupational History    Employer: [employer]  Tobacco Use  . Smoking status: Former Smoker    Packs/day: 2.00    Years: 15.00    Pack years: 30.00    Types: Cigarettes    Last attempt to quit: 02/09/1995    Years since quitting: 22.0  . Smokeless tobacco: Never Used  Substance and Sexual Activity  . Alcohol use: No    Alcohol/week: 0.0 oz  . Drug use: No  . Sexual activity: Not Currently  Other Topics Concern  . None  Social History Narrative   Patient lives at home alone. Patient works at [employer].   Caffeine daily- 2   Right handed.   Education- 11 th grade   Additional Social History:  Sleep: Good  Appetite:  Good  Current Medications: Current Facility-Administered Medications  Medication Dose Route Frequency Provider Last Rate Last Dose  . acetaminophen (TYLENOL) tablet 650 mg  650 mg Oral Q6H PRN Rankin, Shuvon B, NP   650 mg at 03/06/17 1831  . albuterol (PROVENTIL HFA;VENTOLIN HFA) 108 (90 Base) MCG/ACT inhaler 2 puff  2 puff Inhalation Q6H PRN Rankin, Shuvon B, NP      . alum & mag hydroxide-simeth (MAALOX/MYLANTA) 200-200-20 MG/5ML suspension 30 mL  30 mL Oral Q4H PRN Rankin, Shuvon B, NP      . diphenhydrAMINE (BENADRYL) capsule 50 mg  50 mg Oral QHS PRN Pennelope Bracken, MD      . fluPHENAZine (PROLIXIN) tablet 7.5 mg  7.5 mg Oral BID Pennelope Bracken, MD      . haloperidol (HALDOL)  tablet 5 mg  5 mg Oral Q8H PRN Pennelope Bracken, MD       Or  . haloperidol lactate (HALDOL) injection 5 mg  5 mg Intramuscular Q8H PRN Pennelope Bracken, MD      . hydrOXYzine (ATARAX/VISTARIL) tablet 50 mg  50 mg Oral Q6H PRN Pennelope Bracken, MD      . magnesium hydroxide (MILK OF MAGNESIA) suspension 30 mL  30 mL Oral Daily PRN Rankin, Shuvon B, NP   30 mL at 03/04/17 1115  . metFORMIN (GLUCOPHAGE) tablet 500 mg  500 mg Oral BID WC Rankin, Shuvon B, NP   500 mg at 03/08/17 0834  . senna (SENOKOT) tablet 17.2 mg  2 tablet Oral BID Pennelope Bracken, MD   17.2 mg at 03/08/17 0834  . topiramate (TOPAMAX) tablet 100 mg  100 mg Oral BID Derrill Center, NP   100 mg at 03/08/17 0834  . traMADol (ULTRAM) tablet 100 mg  100 mg Oral Q8H PRN Lindon Romp A, NP   100 mg at 03/08/17 0835  . verapamil (CALAN-SR) CR tablet 120 mg  120 mg Oral QHS Rankin, Shuvon B, NP   120 mg at 03/07/17 2151    Lab Results: No results found for this or any previous visit (from the past 48 hour(s)).  Blood Alcohol level:  Lab Results  Component Value Date   ETH <10 02/54/2706    Metabolic Disorder Labs: Lab Results  Component Value Date   HGBA1C 6.2 (H) 03/06/2017   MPG 131.24 03/06/2017   MPG 108 05/17/2015   Lab Results  Component Value Date   PROLACTIN 64.2 (H) 03/06/2017   Lab Results  Component Value Date   CHOL 175 03/06/2017   TRIG 234 (H) 03/06/2017   HDL 69 03/06/2017   CHOLHDL 2.5 03/06/2017   VLDL 47 (H) 03/06/2017   LDLCALC 59 03/06/2017   LDLCALC 93 05/17/2015    Physical Findings: AIMS: Facial and Oral Movements Muscles of Facial Expression: None, normal Lips and Perioral Area: None, normal Jaw: None, normal Tongue: None, normal,Extremity Movements Upper (arms, wrists, hands, fingers): None, normal Lower (legs, knees, ankles, toes): None, normal, Trunk Movements Neck, shoulders, hips: None, normal, Overall Severity Severity of abnormal movements  (highest score from questions above): None, normal Incapacitation due to abnormal movements: None, normal Patient's awareness of abnormal movements (rate only patient's report): No Awareness, Dental Status Current problems with teeth and/or dentures?: No Does patient usually wear dentures?: No  CIWA:    COWS:     Musculoskeletal: Strength & Muscle Tone: within normal limits Gait & Station: normal Patient leans: N/A  Psychiatric Specialty  Exam: Physical Exam  Nursing note and vitals reviewed.   Review of Systems  Constitutional: Negative for chills and fever.  Respiratory: Negative for cough and shortness of breath.   Cardiovascular: Negative for chest pain.  Gastrointestinal: Negative for abdominal pain, heartburn, nausea and vomiting.  Psychiatric/Behavioral: Positive for depression and hallucinations. The patient is nervous/anxious. The patient does not have insomnia.     Blood pressure 111/76, pulse 78, temperature 97.6 F (36.4 C), temperature source Oral, resp. rate 18, height 5' 5.5" (1.664 m), weight 103.4 kg (228 lb).Body mass index is 37.36 kg/m.  General Appearance: Casual  Eye Contact:  Good  Speech:  Clear and Coherent and Normal Rate  Volume:  Normal  Mood:  Anxious  Affect:  Appropriate, Congruent and Constricted  Thought Process:  Coherent and Goal Directed  Orientation:  Full (Time, Place, and Person)  Thought Content:  Logical  Suicidal Thoughts:  No  Homicidal Thoughts:  No  Memory:  Immediate;   Good Recent;   Good Remote;   Good  Judgement:  Fair  Insight:  Fair  Psychomotor Activity:  Normal  Concentration:  Concentration: Fair  Recall:  [employer] of Knowledge:  Fair  Language:  Fair  Akathisia:  No  Handed:    AIMS (if indicated):     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 Pt reports improvement of AH and drowsiness with change to prolixin. She agrees to increase dose of prolixin today.  - Continue inpatient hospitalization  - MDD, recurrent, severe, with psychotic features vs primary psychotic disorder - Change prolixin 5mg  po BID to prolixin 7.5mg  po BID  - Anxiety - Continue vistaril 50mg  po q6h prn anxiety  - insomnia - Continue benadryl 50mg  po qhs prn insomnia  - DMII - Continue metformin 500mg  BID  - HTN - Continue verapamil 120mg  qhs  - Migraine headaches - Continue topamax 100mg  BID  - Chronic pain - Continue tramadol 100mg  q8h prn pain  -encourage participation in groups and the therapeutic milieu  -Discharge planning will be ongoing   Pennelope Bracken, MD 03/08/2017, 12:26 PM

## 2017-03-08 NOTE — Progress Notes (Signed)
Adult Psychoeducational Group Note  Date:  03/08/2017 Time:  2:10 AM  Group Topic/Focus:  Wrap-Up Group:   The focus of this group is to help patients review their daily goal of treatment and discuss progress on daily workbooks.  Participation Level:  Minimal  Participation Quality:  Appropriate  Affect:  Appropriate  Cognitive:  Appropriate  Insight: Appropriate and Limited  Engagement in Group:  Engaged  Modes of Intervention:  Discussion  Additional Comments:  Pt stated her goal for today was to interact more with peers and staff and to not sleep as much. Pt stated she accomplished her goal and felt good about it. Pt rated her over all day an 8 out of 10. Pt stated she attend all groups held today.  Candy Sledge 03/08/2017, 2:10 AM

## 2017-03-08 NOTE — BHH Group Notes (Signed)
[place] Group Notes:  (Nursing/MHT/Case Management/Adjunct)  Date:  03/08/2017  Time:  4:32 PM  Type of Therapy:  Psychoeducational Skills  Participation Level:  Did Not Attend    Summary of Progress/Problems:  Vela Prose 03/08/2017, 4:32 PM

## 2017-03-08 NOTE — BHH Group Notes (Signed)
[place] Group Notes:  (Nursing/MHT/Case Management/Adjunct)  Date:  03/08/2017  Time:  10:37 AM  Type of Therapy:  Orientation and Goals group  Participation Level:  Active  Participation Quality:  Appropriate  Affect:  Appropriate  Cognitive:  Appropriate  Insight:  Appropriate and Good  Engagement in Group:  Engaged  Modes of Intervention:  Discussion, Education and Orientation  Summary of Progress/Problems: Pt attended goals/orientation group this morning and participated in group. Pt goal for today is to sleep less and to attend more groups. Staff was able to discuss the treatment plan, safety, and rules for the unit. Pt was in engaged and appropriate in group.   Kaimani Clayson J Natacia Chaisson 03/08/2017, 10:37 AM

## 2017-03-08 NOTE — Progress Notes (Signed)
DAR NOTE: Patient presents with flat affect and depressed mood.  Reports buzzing noise.  Denies suicidal thoughts and visual hallucination.  Denies pain, auditory and visual hallucinations.  Described energy level as normal and concentration as good.  Rates depression at 0, hopelessness at 0, and anxiety at 0.  Maintained on routine safety checks.  Medications given as prescribed.  Support and encouragement offered as needed.  States goal for today is "getting the buzzing out of my head."  Patient remained in her room most of this shift.  Offered no complaint.

## 2017-03-08 NOTE — BHH Group Notes (Signed)
[place] Group Notes:  (Nursing/MHT/Case Management/Adjunct)  Date:  03/08/2017  Time:  4:45 PM  Type of Therapy:  Psychoeducational Skills  Participation Level:  Did Not Attend   Heather Haley 03/08/2017, 4:45 PM

## 2017-03-09 LAB — GLUCOSE, CAPILLARY: Glucose-Capillary: 127 mg/dL — ABNORMAL HIGH (ref 65–99)

## 2017-03-09 MED ORDER — FLUPHENAZINE HCL 2.5 MG PO TABS
7.5000 mg | ORAL_TABLET | Freq: Every day | ORAL | Status: DC
  Administered 2017-03-09: 7.5 mg via ORAL
  Filled 2017-03-09 (×2): qty 3

## 2017-03-09 MED ORDER — FLUPHENAZINE DECANOATE 25 MG/ML IJ SOLN
25.0000 mg | INTRAMUSCULAR | Status: DC
Start: 2017-03-09 — End: 2017-03-10
  Administered 2017-03-09: 25 mg via INTRAMUSCULAR
  Filled 2017-03-09 (×2): qty 1

## 2017-03-09 NOTE — Progress Notes (Addendum)
Nursing Progress Note: 7p-7a D: Pt currently presents with a anxious/guarded/pleasant affect and behavior. Pt states "I go home tomorrow, and I am really excited about it. I have this off and on buzzing in my ears." Interacting minimally with the milieu. Pt reports good sleep during the previous night with current medication regimen. Pt did attend wrap-up group.  A: Pt provided with medications per providers orders. Pt's labs and vitals were monitored throughout the night. Pt supported emotionally and encouraged to express concerns and questions. Pt educated on medications.  R: Pt's safety ensured with 15 minute and environmental checks. Pt currently denies SI, HI, and VH and endorses AH. Pt verbally contracts to seek staff if SI,HI, or AVH occurs and to consult with staff before acting on any harmful thoughts. Will continue to monitor.

## 2017-03-09 NOTE — Progress Notes (Signed)
[facility] MD Progress Note  03/09/2017 11:30 AM Heather Haley  MRN:  [id number] Subjective:    Heather Haley is a 26 y/p F with history of MDD with psychotic features (and self reported history of schizophrenia) and multiple medical complaints who was admitted voluntarily from ED with worsening symptoms of AH, tactile hallucinations, delusions, depression, and anxiety. Pt reported feeling as if she has been electrocuted in her own home and she has been hearing AH of clicks which she able to communicate with. She also endorsedparanoia of being followed.Pt agreed to resume her home medications with addition of trial of haldol, but her symptoms of AH did not improve, so she was transitioned to prolixin and dosage was titrated up. Pt reported improvement of her symptoms of psychosis.  Today upon evaluation, pt shares, "I had difficulty breathing this morning. I get fluid on my legs sometimes, but it's ok now." Pt has been offered albuterol inhaler and she was encouraged to ambulate as much as possible to help with fluid return from her legs. She reports improvement of AH today, and she states, "The ringing is a lot better but it's still there. I'm not hearing screaming any more and they aren't communicating with me." She denies VH. She denies paranoia or tactile hallucinations. She denies SI/HI. She is sleeping adequately and her appetite is fair. Pt would like to attempt trial of the long-acting prolixin decanoate, and she was in agreement to start prolixin decanoate today. She feels that if she continues to have symptoms improvement and stability, that she will be ready to return to home tomorrow. Pt was in agreement with the above plan and she had no further questions, comments, or concerns.  Principal Problem: MDD (major depressive disorder), recurrent, severe, with psychosis ([place]) Diagnosis:   Patient Active Problem List   Diagnosis Date Noted  . MDD (major depressive disorder), recurrent, severe,  with psychosis ([place]) [F33.3] 03/03/2017  . Contracture, tendon sheath [M62.40] 03/23/2016  . Dysuria [R30.0] 06/17/2015  . Pure hypercholesterolemia [E78.00] 05/29/2015  . Opioid-induced constipation (OIC) [K59.03, T40.2X5A] 05/19/2015  . Osteoarthritis of hip (Right) [M16.11] 05/08/2015  . Chronic hip pain (Right) [M25.551, G89.29] 05/05/2015  . Long term prescription opiate use [Z79.891] 05/05/2015  . Chronic knee pain (S/P TKR:Total Knee Replacement) (Left) [W54.627, G89.29] 05/05/2015  . Osteoarthritis of hips (Location of Secondary source of pain) (Bilateral) (Right) [M16.0] 05/05/2015  . History of total knee replacement (Left) [Z96.659] 05/05/2015  . Chronic groin pain (Location of Secondary source of pain) (Right) [R10.31, G89.29] 05/05/2015  . Chronic lower extremity pain (Location of Tertiary source of pain) (Bilateral) (R>L) [M79.606, G89.29] 05/05/2015  . Chronic pain [G89.29] 02/05/2015  . Chronic ankle pain (secondary to a work-related injury) (Date of injury 04/17/2006) (Left) [O35.009, G89.29] 12/25/2014  . Chronic low back pain (Location of Primary Pain) (Bilateral) (R>L) [M54.5, G89.29] 12/25/2014  . Lumbar spondylosis (L3-4 & L4-5) [F81.829] 12/25/2014  . Lumbar facet syndrome (Location of Primary Source of Pain) (Bilateral) (R>L) [M47.816] 12/25/2014  . Encounter for therapeutic drug level monitoring [Z51.81] 11/19/2014  . Encounter for long-term opiate analgesic use [Z79.891] 11/19/2014  . Long-term current use of opiate analgesic [Z79.891] 11/19/2014  . Uncomplicated opioid dependence ([place]) [F11.20] 11/19/2014  . Opiate use (35 MME/Day) [F11.90] 11/19/2014  . Chronic pain syndrome [G89.4] 11/19/2014  . Diabetic sensory peripheral neuropathy (Bilateral Lower Extremity) [E11.42] 11/19/2014  . Generalized anxiety disorder [F41.1] 11/19/2014  . History of panic attacks [Z86.59] 11/19/2014  . History of tobacco abuse [Z87.891]  11/19/2014  . GERD (gastroesophageal reflux  disease) [K21.9] 11/19/2014  . Non-insulin dependent type 2 diabetes mellitus ([place]) [E11.9] 11/19/2014  . Coccygeal pain [M53.3] 11/19/2014  . History of migraine [Z86.69] 11/19/2014  . Chronic constipation [K59.09] 10/24/2013  . Personal history of other diseases of the digestive system [Z87.19] 10/24/2013  . Leg length inequality [M21.70] 09/12/2012  . Morbid obesity ([place]) [E66.01] 05/26/2012  . Hypertension [I10]   . IBS (irritable bowel syndrome) [K58.9]   . Depression, major, recurrent, moderate ([place]) [F33.1]   . ADVERSE DRUG REACTION [T88.7XXA] 12/15/2009   Total Time spent with patient: 30 minutes  Past Psychiatric History: see H&P  Past Medical History:  Past Medical History:  Diagnosis Date  . Anxiety   . Chronic pain    previously saw Dr. Consuela Mimes in pain clinic, then saw pain specialist in [place]  . Depression   . Diabetes mellitus ([place])   . Frequency of urination   . GERD (gastroesophageal reflux disease)   . Headache(784.0)   . High cholesterol   . Hypertension   . IBS (irritable bowel syndrome)   . Left ankle instability   . Left knee DJD   . Lumbar Degenerative Disc Disease of  10/11/2014  . Neuromuscular disorder ([place])   . Osteoarthritis of hip (Right) 05/05/2015  . Other enthesopathy of ankle and tarsus 12/15/2009   Qualifier: Diagnosis of  By: Oneida Alar MD, KARL    . Peripheral sensory neuropathy (Bilateral) 11/19/2014  . Postoperative nausea and vomiting     Past Surgical History:  Procedure Laterality Date  . ABDOMINAL HYSTERECTOMY    . ANKLE SURGERY    . APPENDECTOMY    . COLONOSCOPY  2013  . ESOPHAGOGASTRODUODENOSCOPY (EGD) WITH PROPOFOL N/A 10/03/2014   Procedure: ESOPHAGOGASTRODUODENOSCOPY (EGD) WITH PROPOFOL;  Surgeon: Josefine Class, MD;  [facility] ENDOSCOPY;  Service: Endoscopy;  Laterality: N/A;  . KNEE ARTHROSCOPY  1997   left knee  . LEFT HEART CATHETERIZATION WITH CORONARY ANGIOGRAM N/A 01/11/2013   Procedure: LEFT HEART  CATHETERIZATION WITH CORONARY ANGIOGRAM;  Surgeon: Sinclair Grooms, MD;  Location: [facility] CATH LAB;  Service: Cardiovascular;  Laterality: N/A;  . TOTAL KNEE ARTHROPLASTY  08/30/2011   Procedure: TOTAL KNEE ARTHROPLASTY;  Surgeon: Lorn Junes, MD;  Location: [facility];  Service: Orthopedics;  Laterality: Left;   Family History:  Family History  Problem Relation Age of Onset  . Cancer Mother   . Hypertension Father   . Heart disease Father    Family Psychiatric  History: see H&P Social History:  Social History   Substance and Sexual Activity  Alcohol Use No  . Alcohol/week: 0.0 oz     Social History   Substance and Sexual Activity  Drug Use No    Social History   Socioeconomic History  . Marital status: Divorced    Spouse name: None  . Number of children: 2  . Years of education: 24  . Highest education level: None  Social Needs  . Financial resource strain: None  . Food insecurity - worry: None  . Food insecurity - inability: None  . Transportation needs - medical: None  . Transportation needs - non-medical: None  Occupational History    Employer: [employer]  Tobacco Use  . Smoking status: Former Smoker    Packs/day: 2.00    Years: 15.00    Pack years: 30.00    Types: Cigarettes    Last attempt to quit: 02/09/1995    Years since quitting: 22.0  .  Smokeless tobacco: Never Used  Substance and Sexual Activity  . Alcohol use: No    Alcohol/week: 0.0 oz  . Drug use: No  . Sexual activity: Not Currently  Other Topics Concern  . None  Social History Narrative   Patient lives at home alone. Patient works at [employer].   Caffeine daily- 2   Right handed.   Education- 11 th grade   Additional Social History:                         Sleep: Good  Appetite:  Good  Current Medications: Current Facility-Administered Medications  Medication Dose Route Frequency Provider Last Rate Last Dose  . acetaminophen (TYLENOL) tablet 650 mg  650 mg Oral Q6H  PRN Rankin, Shuvon B, NP   650 mg at 03/06/17 1831  . albuterol (PROVENTIL HFA;VENTOLIN HFA) 108 (90 Base) MCG/ACT inhaler 2 puff  2 puff Inhalation Q6H PRN Rankin, Shuvon B, NP      . alum & mag hydroxide-simeth (MAALOX/MYLANTA) 200-200-20 MG/5ML suspension 30 mL  30 mL Oral Q4H PRN Rankin, Shuvon B, NP      . diphenhydrAMINE (BENADRYL) capsule 50 mg  50 mg Oral QHS PRN Pennelope Bracken, MD      . fluPHENAZine (PROLIXIN) tablet 7.5 mg  7.5 mg Oral QHS Pennelope Bracken, MD      . fluPHENAZine decanoate (PROLIXIN) injection 25 mg  25 mg Intramuscular Q21 days Maris Berger T, MD      . haloperidol (HALDOL) tablet 5 mg  5 mg Oral Q8H PRN Pennelope Bracken, MD       Or  . haloperidol lactate (HALDOL) injection 5 mg  5 mg Intramuscular Q8H PRN Pennelope Bracken, MD      . hydrOXYzine (ATARAX/VISTARIL) tablet 50 mg  50 mg Oral Q6H PRN Pennelope Bracken, MD      . magnesium hydroxide (MILK OF MAGNESIA) suspension 30 mL  30 mL Oral Daily PRN Rankin, Shuvon B, NP   30 mL at 03/04/17 1115  . metFORMIN (GLUCOPHAGE) tablet 500 mg  500 mg Oral BID WC Rankin, Shuvon B, NP   500 mg at 03/09/17 0809  . senna (SENOKOT) tablet 17.2 mg  2 tablet Oral BID Pennelope Bracken, MD   17.2 mg at 03/09/17 0819  . topiramate (TOPAMAX) tablet 100 mg  100 mg Oral BID Derrill Center, NP   100 mg at 03/09/17 0809  . traMADol (ULTRAM) tablet 100 mg  100 mg Oral Q8H PRN Lindon Romp A, NP   100 mg at 03/09/17 0811  . verapamil (CALAN-SR) CR tablet 120 mg  120 mg Oral QHS Rankin, Shuvon B, NP   120 mg at 03/08/17 2130    Lab Results: No results found for this or any previous visit (from the past 48 hour(s)).  Blood Alcohol level:  Lab Results  Component Value Date   ETH <10 02/72/5366    Metabolic Disorder Labs: Lab Results  Component Value Date   HGBA1C 6.2 (H) 03/06/2017   MPG 131.24 03/06/2017   MPG 108 05/17/2015   Lab Results  Component Value Date    PROLACTIN 64.2 (H) 03/06/2017   Lab Results  Component Value Date   CHOL 175 03/06/2017   TRIG 234 (H) 03/06/2017   HDL 69 03/06/2017   CHOLHDL 2.5 03/06/2017   VLDL 47 (H) 03/06/2017   LDLCALC 59 03/06/2017   LDLCALC 93 05/17/2015    Physical  Findings: AIMS: Facial and Oral Movements Muscles of Facial Expression: None, normal Lips and Perioral Area: None, normal Jaw: None, normal Tongue: None, normal,Extremity Movements Upper (arms, wrists, hands, fingers): None, normal Lower (legs, knees, ankles, toes): None, normal, Trunk Movements Neck, shoulders, hips: None, normal, Overall Severity Severity of abnormal movements (highest score from questions above): None, normal Incapacitation due to abnormal movements: None, normal Patient's awareness of abnormal movements (rate only patient's report): No Awareness, Dental Status Current problems with teeth and/or dentures?: No Does patient usually wear dentures?: No  CIWA:    COWS:     Musculoskeletal: Strength & Muscle Tone: within normal limits Gait & Station: normal Patient leans: N/A  Psychiatric Specialty Exam: Physical Exam  Nursing note and vitals reviewed.   Review of Systems  Constitutional: Negative for chills and fever.  Respiratory: Positive for shortness of breath. Negative for cough.   Cardiovascular: Positive for leg swelling. Negative for chest pain and palpitations.  Gastrointestinal: Negative for heartburn and nausea.  Psychiatric/Behavioral: Positive for hallucinations. Negative for depression and suicidal ideas. The patient is nervous/anxious. The patient does not have insomnia.     Blood pressure 109/79, pulse 84, temperature 97.7 F (36.5 C), temperature source Oral, resp. rate 18, height 5' 5.5" (1.664 m), weight 103.4 kg (228 lb).Body mass index is 37.36 kg/m.  General Appearance: Casual and Fairly Groomed  Eye Contact:  Good  Speech:  Clear and Coherent and Normal Rate  Volume:  Normal  Mood:   Anxious and Euthymic  Affect:  Appropriate, Congruent and Flat  Thought Process:  Coherent and Goal Directed  Orientation:  Full (Time, Place, and Person)  Thought Content:  Hallucinations: Auditory  Suicidal Thoughts:  No  Homicidal Thoughts:  No  Memory:  Immediate;   Fair Recent;   Fair Remote;   Fair  Judgement:  Fair  Insight:  Fair  Psychomotor Activity:  Normal  Concentration:  Concentration: Fair  Recall:  [employer] of Knowledge:  Fair  Language:  Fair  Akathisia:  No  Handed:    AIMS (if indicated):     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 Pt reports ongoing improvement of presenting symptoms of psychosis with use of prolixin, and she is in agreement to transition to long-acting injectable form. She is anticipating likely discharge to home tomorrow if she has ongoing improvement/stability.   - Continue inpatient hospitalization  - MDD, recurrent, severe, with psychotic features vs primary psychotic disorder - Changeprolixin 7.5mg  po BID to prolixin 7.5mg  po qhs   - Start prolixin decanoate 25mg  IM q21 days (first dose today 03/09/17)  - Anxiety -Continuevistaril 50mg  po q6h prn anxiety  - insomnia -Continue benadryl 50mg  po qhs prn insomnia  - DMII -Continuemetformin 500mg  BID  - HTN -Continueverapamil 120mg  qhs  - Migraine headaches -Continuetopamax 100mg  BID  - Chronic pain -Continuetramadol 100mg  q8h prn pain  -encourage participation in groups and the therapeutic milieu  -Discharge planning will be ongoing   Pennelope Bracken, MD 03/09/2017, 11:30 AM

## 2017-03-09 NOTE — Progress Notes (Signed)
Patient ID: Heather Haley, female   DOB: 05-11-1960, 57 y.o.   MRN: [id number]  DAR: Pt. Denies SI/HI and visual hallucinations. She does report that she is still experiencing auditory hallucinations in the form of "high pitched squealing." Patient reports pain in her ankle and back this morning and received PRN Tramadol. Support and encouragement provided to the patient. Scheduled medications administered to patient per physician's orders. Patient tolerated her injection well this afternoon. Patient is minimal but cooperative with staff. She is seen in the milieu intermittently. She presents with flat affect and depressed mood. Q15 minute checks are maintained for safety.

## 2017-03-09 NOTE — BHH Group Notes (Signed)
[facility] Group Notes:  Maslow's Hierarchy of Needs Group   Date:  03/09/2017  Time:  5:00 PM  Type of Therapy:  Psychoeducational Skills  Participation Level:  Did Not Attend  Participation Quality:  N/a  Affect:  N/A  Cognitive:  N/A  Insight:  None  Engagement in Group:  None  Modes of Intervention:  Discussion, Education, Socialization and Support  Summary of Progress/Problems: Patient was invited to group but did not attend.   Heather Haley E 03/09/2017, 5:00 PM

## 2017-03-09 NOTE — BHH Group Notes (Signed)
LCSW Group Therapy Note   03/09/2017 1:15pm   Type of Therapy and Topic:  Group Therapy:  Trust and Honesty  Participation Level:  Active  Description of Group:    In this group patients will be asked to explore the value of being honest.  Patients will be guided to discuss their thoughts, feelings, and behaviors related to honesty and trusting in others. Patients will process together how trust and honesty relate to forming relationships with peers, family members, and self. Each patient will be challenged to identify and express feelings of being vulnerable. Patients will discuss reasons why people are dishonest and identify alternative outcomes if one was truthful (to self or others). This group will be process-oriented, with patients participating in exploration of their own experiences, giving and receiving support, and processing challenge from other group members.   Therapeutic Goals: 1. Patient will identify why honesty is important to relationships and how honesty overall affects relationships.  2. Patient will identify a situation where they lied or were lied too and the  feelings, thought process, and behaviors surrounding the situation 3. Patient will identify the meaning of being vulnerable, how that feels, and how that correlates to being honest with self and others. 4. Patient will identify situations where they could have told the truth, but instead lied and explain reasons of dishonesty.   Summary of Patient Progress  "Honesty is being truthful."  Left after 15 minutes and did not return.  Therapeutic Modalities:   Cognitive Behavioral Therapy Solution Focused Therapy Motivational Interviewing Brief Therapy  Trish Mage, LCSW 03/09/2017 11:35 AM

## 2017-03-09 NOTE — Progress Notes (Signed)
Recreation Therapy Notes  Date: 03/09/17 Time: 1000 Location: 500 Hall Dayroom  Group Topic: Stress Management  Goal Area(s) Addresses:  Patient will verbalize importance of using healthy stress management.  Patient will identify positive emotions associated with healthy stress management.   Behavioral Response: Engaged  Intervention: Calm app, script  Activity :  LRT introduced the stress management techniques of meditation and guided imagery.  LRT played a meditation from the Calm app that focused on taking inventory of any sensation they may be feeling in the body.  LRT then read a script to take patients on a mental vacation to the beach.  Education:  Stress Management, Discharge Planning.   Education Outcome: Acknowledges edcuation/In group clarification offered/Needs additional education  Clinical Observations/Feedback: Pt was quiet and engaged.  Pt fully participated in both techniques.  Pt stated she felt more relaxed at completion of group.    Victorino Sparrow, LRT/CTRS    Victorino Sparrow A 03/09/2017 11:46 AM

## 2017-03-09 NOTE — Progress Notes (Signed)
Nursing Progress Note [id number]  D) Patient presents with flat affect and depressed mood. Patient did not attend group and is isolative to room. Patient did get up for medications and request Tramadol for her ankle and lower back pain. Patient denies SI/HI but continues to hear "screaming in my left ear". Patient contracts for safety on the unit. Patient reports sleeping well with current regimen. Patient compliant with scheduled medications.  A) Patient educated about and provided medication as scheduled or requested per provider's orders. Patient safety maintained with q15 min safety checks. High fall risk precautions in place. Emotional support given. 1:1 interaction and active listening provided. Snacks and fluids provided. Labs, vital signs and patient behavior monitored throughout shift. Patient encouraged to work on treatment plan.  R) Patient remains safe on the unit at this time. Patient agrees to make needs known to staff. Will continue to monitor and assess for changes.

## 2017-03-09 NOTE — Progress Notes (Signed)
Patient ID: Heather Haley, female   DOB: [birth year], 57 y.o.   MRN: [id number]  Patient refused morning Prolixin dose. MD [place] notified during treatment team.

## 2017-03-09 NOTE — Progress Notes (Signed)
[facility] Group Notes:  (Nursing/MHT/Case Management/Adjunct)  Date:  03/09/2017  Time: 2000   Type of Therapy:  Group Therapy  Participation Level:  Active  Participation Quality:  Appropriate and Sharing  Affect:  Depressed  Cognitive:  Appropriate  Insight:  Appropriate  Engagement in Group:  Engaged  Modes of Intervention:  Discussion  Summary of Progress/Problems: Goal- "Sleep, rest and attending more of these meetings. I only took one nape today and attended all the meetings". Pt rated her day 9/10. When asked if there was anything that staff could do to help her improve her day tomorrow, pt stated, "No, I have to do it".   Wynonia Hazard Laverne 03/09/2017, 11:44 PM

## 2017-03-10 MED ORDER — ALBUTEROL SULFATE HFA 108 (90 BASE) MCG/ACT IN AERS: 1.0000 | INHALATION_SPRAY | Freq: Four times a day (QID) | RESPIRATORY_TRACT | Status: DC | PRN

## 2017-03-10 MED ORDER — FLUPHENAZINE DECANOATE 25 MG/ML IJ SOLN: 25.0000 mg | INTRAMUSCULAR | 0 refills | Status: DC

## 2017-03-10 MED ORDER — TOPIRAMATE 100 MG PO TABS: 100.0000 mg | ORAL_TABLET | Freq: Two times a day (BID) | ORAL | 0 refills | Status: DC

## 2017-03-10 MED ORDER — METFORMIN HCL 500 MG PO TABS: 500.0000 mg | ORAL_TABLET | Freq: Two times a day (BID) | ORAL | 0 refills | Status: DC

## 2017-03-10 MED ORDER — ACETAMINOPHEN 500 MG PO TABS: 500.0000 mg | ORAL_TABLET | Freq: Four times a day (QID) | ORAL | 0 refills | Status: DC | PRN

## 2017-03-10 MED ORDER — HYDROXYZINE HCL 50 MG PO TABS: 50.0000 mg | ORAL_TABLET | Freq: Four times a day (QID) | ORAL | 0 refills | Status: DC | PRN

## 2017-03-10 MED ORDER — SENNOSIDES 8.6 MG PO TABS: 1.0000 | ORAL_TABLET | Freq: Every day | ORAL | Status: DC | PRN

## 2017-03-10 MED ORDER — VERAPAMIL HCL ER 120 MG PO TBCR: 120.0000 mg | EXTENDED_RELEASE_TABLET | Freq: Every day | ORAL | 1 refills | Status: DC

## 2017-03-10 MED ORDER — DIPHENHYDRAMINE HCL 50 MG PO CAPS: 50.0000 mg | ORAL_CAPSULE | Freq: Every evening | ORAL | 0 refills | Status: DC | PRN

## 2017-03-10 MED ORDER — FLUPHENAZINE HCL 2.5 MG PO TABS: 7.5000 mg | ORAL_TABLET | Freq: Every day | ORAL | 0 refills | Status: DC

## 2017-03-10 MED ORDER — TRAMADOL HCL 50 MG PO TABS: 100.0000 mg | ORAL_TABLET | Freq: Three times a day (TID) | ORAL | 2 refills | Status: DC | PRN

## 2017-03-10 NOTE — Progress Notes (Signed)
Discharge Note:  Patient discharged home with family member.  Patient denied SI and HI.  Denied A/V hallucinations.  Suicide prevention information given and discussed with patient who stated she understood and had no questions.  Patient stated she appreciated all assistance received from [facility] staff.  Patient stated she received all her belongings, clothing, toiletries, misc items, prescriptions.  All required discharge information given to patient at discharge.

## 2017-03-10 NOTE — Tx Team (Signed)
Interdisciplinary Treatment and Diagnostic Plan Update  03/10/2017 Time of Session: 11:10 AM  Heather Haley MRN: [id number]  Principal Diagnosis: MDD (major depressive disorder), recurrent, severe, with psychosis ([place])  Secondary Diagnoses: Principal Problem:   MDD (major depressive disorder), recurrent, severe, with psychosis ([place])   Current Medications:  Current Facility-Administered Medications  Medication Dose Route Frequency Provider Last Rate Last Dose  . acetaminophen (TYLENOL) tablet 650 mg  650 mg Oral Q6H PRN Rankin, Shuvon B, NP   650 mg at 03/06/17 1831  . albuterol (PROVENTIL HFA;VENTOLIN HFA) 108 (90 Base) MCG/ACT inhaler 2 puff  2 puff Inhalation Q6H PRN Rankin, Shuvon B, NP      . alum & mag hydroxide-simeth (MAALOX/MYLANTA) 200-200-20 MG/5ML suspension 30 mL  30 mL Oral Q4H PRN Rankin, Shuvon B, NP      . diphenhydrAMINE (BENADRYL) capsule 50 mg  50 mg Oral QHS PRN Pennelope Bracken, MD      . fluPHENAZine (PROLIXIN) tablet 7.5 mg  7.5 mg Oral QHS Pennelope Bracken, MD   7.5 mg at 03/09/17 2154  . fluPHENAZine decanoate (PROLIXIN) injection 25 mg  25 mg Intramuscular Q21 days Maris Berger T, MD   25 mg at 03/09/17 1522  . haloperidol (HALDOL) tablet 5 mg  5 mg Oral Q8H PRN Pennelope Bracken, MD       Or  . haloperidol lactate (HALDOL) injection 5 mg  5 mg Intramuscular Q8H PRN Pennelope Bracken, MD      . hydrOXYzine (ATARAX/VISTARIL) tablet 50 mg  50 mg Oral Q6H PRN Pennelope Bracken, MD   50 mg at 03/10/17 0755  . magnesium hydroxide (MILK OF MAGNESIA) suspension 30 mL  30 mL Oral Daily PRN Rankin, Shuvon B, NP   30 mL at 03/04/17 1115  . metFORMIN (GLUCOPHAGE) tablet 500 mg  500 mg Oral BID WC Rankin, Shuvon B, NP   500 mg at 03/10/17 0745  . senna (SENOKOT) tablet 17.2 mg  2 tablet Oral BID Pennelope Bracken, MD   17.2 mg at 03/10/17 0745  . topiramate (TOPAMAX) tablet 100 mg  100 mg Oral BID Derrill Center, NP    100 mg at 03/10/17 0745  . traMADol (ULTRAM) tablet 100 mg  100 mg Oral Q8H PRN Lindon Romp A, NP   100 mg at 03/10/17 0755  . verapamil (CALAN-SR) CR tablet 120 mg  120 mg Oral QHS Rankin, Shuvon B, NP   120 mg at 03/09/17 2154    PTA Medications: Medications Prior to Admission  Medication Sig Dispense Refill Last Dose  . aspirin EC 81 MG tablet Take 81 mg by mouth daily.   03/01/2017 at Unknown time  . CALCIUM PO Take 1 tablet by mouth daily.   03/01/2017 at Unknown time  . cholecalciferol (VITAMIN D) 1000 units tablet Take 1,000 Units by mouth daily.   03/01/2017 at Unknown time  . docusate sodium (COLACE) 100 MG capsule Take 100 mg by mouth daily.   03/01/2017 at Unknown time  . HYDROcodone-acetaminophen (NORCO/VICODIN) 5-325 MG tablet Take 1 tablet by mouth daily as needed for severe pain. (Patient taking differently: Take 1-2 tablets by mouth every 6 (six) hours as needed for moderate pain or severe pain. ) 30 tablet 0 03/02/2017 at Unknown time  . pravastatin (PRAVACHOL) 40 MG tablet Take 40 mg by mouth at bedtime.   03/01/2017 at Unknown time  . ranitidine (ZANTAC) 150 MG capsule Take 150 mg by mouth as needed for heartburn.  Past Week at Unknown time  . rizatriptan (MAXALT) 10 MG tablet Take 10 mg by mouth as needed for migraine. May repeat in 2 hours if needed   Past Week at Unknown time  . topiramate (TOPAMAX) 100 MG tablet Take 2 tablets (200 mg total) by mouth 2 (two) times daily. 360 tablet 3 03/01/2017 at Unknown time    Treatment Modalities: Medication Management, Group therapy, Case management,  1 to 1 session with clinician, Psychoeducation, Recreational therapy.  Patient Stressors: Financial difficulties Marital or family conflict  Patient Strengths: Capable of independent living Curator fund of knowledge   Physician Treatment Plan for Primary Diagnosis: MDD (major depressive disorder), recurrent, severe, with psychosis ([place]) Long Term Goal(s):  Improvement in symptoms so as ready for discharge  Short Term Goals: Ability to identify changes in lifestyle to reduce recurrence of condition will improve Ability to identify and develop effective coping behaviors will improve Ability to identify and develop effective coping behaviors will improve  Medication Management: Evaluate patient's response, side effects, and tolerance of medication regimen.  Therapeutic Interventions: 1 to 1 sessions, Unit Group sessions and Medication administration.  Evaluation of Outcomes: Adequate for Discharge  Physician Treatment Plan for Secondary Diagnosis: Principal Problem:   MDD (major depressive disorder), recurrent, severe, with psychosis ([place])  Long Term Goal(s): Improvement in symptoms so as ready for discharge  Short Term Goals: Ability to identify changes in lifestyle to reduce recurrence of condition will improve Ability to identify and develop effective coping behaviors will improve Ability to identify and develop effective coping behaviors will improve  Medication Management: Evaluate patient's response, side effects, and tolerance of medication regimen.  Therapeutic Interventions: 1 to 1 sessions, Unit Group sessions and Medication administration.  Evaluation of Outcomes: Adequate for Discharge   RN Treatment Plan for Primary Diagnosis: MDD (major depressive disorder), recurrent, severe, with psychosis ([place]) Long Term Goal(s): Knowledge of disease and therapeutic regimen to maintain health will improve  Short Term Goals: Ability to participate in decision making will improve, Ability to identify and develop effective coping behaviors will improve and Compliance with prescribed medications will improve  Medication Management: RN will administer medications as ordered by provider, will assess and evaluate patient's response and provide education to patient for prescribed medication. RN will report any adverse and/or side effects to  prescribing provider.  Therapeutic Interventions: 1 on 1 counseling sessions, Psychoeducation, Medication administration, Evaluate responses to treatment, Monitor vital signs and CBGs as ordered, Perform/monitor CIWA, COWS, AIMS and Fall Risk screenings as ordered, Perform wound care treatments as ordered.  Evaluation of Outcomes: Adequate for Discharge   LCSW Treatment Plan for Primary Diagnosis: MDD (major depressive disorder), recurrent, severe, with psychosis ([place]) Long Term Goal(s): Safe transition to appropriate next level of care at discharge, Engage patient in therapeutic group addressing interpersonal concerns.  Short Term Goals: Engage patient in aftercare planning with referrals and resources, Increase social support, Facilitate acceptance of mental health diagnosis and concerns, Identify triggers associated with mental health/substance abuse issues and Increase skills for wellness and recovery  Therapeutic Interventions: Assess for all discharge needs, 1 to 1 time with Social worker, Explore available resources and support systems, Assess for adequacy in community support network, Educate family and significant other(s) on suicide prevention, Complete Psychosocial Assessment, Interpersonal group therapy.  Evaluation of Outcomes: Adequate for Discharge   Progress in Treatment: Attending groups: Yes Participating in groups: Yes Taking medication as prescribed: Yes Toleration of medication: Yes, no side effects reported at this  time Family/Significant other contact made: No  Patient understands diagnosis: No, limited insight  Discussing patient identified problems/goals with staff: Yes Medical problems stabilized or resolved: Yes Denies suicidal/homicidal ideation: Yes Issues/concerns per patient self-inventory: None Other: N/A  New problem(s) identified: None identified at this time.   New Short Term/Long Term Goal(s): "I want to feel safe when I leave here, and I want the  clicking in my head to go away.   Discharge Plan or Barriers: Upon discharge pt will return to her home where she lives alone.  She will follow up with [facility]  Reason for Continuation of Hospitalization:    Estimated Length of Stay: D/C today  Attendees: Patient:  03/10/2017  11:10 AM  Physician: Maris Berger, MD 03/10/2017  11:10 AM  Nursing: Jonette Mate, RN 03/10/2017  11:10 AM  RN Care Manager: Lars Pinks, RN 03/10/2017  11:10 AM  Social Worker: Ripley Fraise, LCSW; Verdis Frederickson, Social Work Intern 03/10/2017  11:10 AM  Recreational Therapist: Victorino Sparrow, [place] 03/10/2017  11:10 AM  Other: Norberto Sorenson, [place] 03/10/2017  11:10 AM  Other:  03/10/2017  11:10 AM  Other: 03/10/2017  11:10 AM    Scribe for Treatment Team: Trish Mage, LCSW 03/10/2017 11:10 AM

## 2017-03-10 NOTE — Progress Notes (Signed)
  [facility] Adult Case Management Discharge Plan :  Will you be returning to the same living situation after discharge:  Yes,  Home At discharge, do you have transportation home?: Yes,  Family Do you have the ability to pay for your medications: Yes,  Medicare   Release of information consent forms completed and in the chart;  Patient's signature needed at discharge.  Patient to Follow up at: Follow-up Information    [facility]. Go on 03/14/2017.   Specialty:  Behavioral Health Why:  Please attend your medication appt on Monday, 03/14/17, at 10:30am with Dr Shea Evans.   Contact information: [place] [phone]          Next level of care provider has access to [place] and Suicide Prevention discussed: Yes,  Yes  Have you used any form of tobacco in the last 30 days? (Cigarettes, Smokeless Tobacco, Cigars, and/or Pipes): No  Has patient been referred to the Quitline?: N/A patient is not a smoker  Patient has been referred for addiction treatment: [place], Student-Social Work 03/10/2017, 10:28 AM

## 2017-03-10 NOTE — Progress Notes (Signed)
Recreation Therapy Notes  INPATIENT RECREATION TR PLAN  Patient Details Name: CRYSTALEE VENTRESS MRN: [id number] DOB: 1960/03/18 Today's Date: 03/10/2017  Rec Therapy Plan Is patient appropriate for Therapeutic Recreation?: Yes Treatment times per week: about 3 days Estimated Length of Stay: 5-7 days TR Treatment/Interventions: Group participation (Comment)  Discharge Criteria Pt will be discharged from therapy if:: Discharged Treatment plan/goals/alternatives discussed and agreed upon by:: Patient/family  Discharge Summary Short term goals set: Pt will show improved self esteem by identify at least 5 positvie qualities about herself. Short term goals met: Adequate for discharge Progress toward goals comments: Groups attended Which groups?: Self-esteem, Coping skills, Anger management, Goal setting Reason goals not met: None Therapeutic equipment acquired: N/A Reason patient discharged from therapy: Discharge from hospital Pt/family agrees with progress & goals achieved: Yes Date patient discharged from therapy: 03/10/17   Victorino Sparrow, LRT/CTRS  Ria Comment, Juno Bozard A 03/10/2017, 11:09 AM

## 2017-03-10 NOTE — [facility] Suicide Risk Assessment (Signed)
[facility] Discharge Suicide Risk Assessment   Principal Problem: MDD (major depressive disorder), recurrent, severe, with psychosis ([place]) Discharge Diagnoses:  Patient Active Problem List   Diagnosis Date Noted  . MDD (major depressive disorder), recurrent, severe, with psychosis ([place]) [F33.3] 03/03/2017  . Contracture, tendon sheath [M62.40] 03/23/2016  . Dysuria [R30.0] 06/17/2015  . Pure hypercholesterolemia [E78.00] 05/29/2015  . Opioid-induced constipation (OIC) [K59.03, T40.2X5A] 05/19/2015  . Osteoarthritis of hip (Right) [M16.11] 05/08/2015  . Chronic hip pain (Right) [M25.551, G89.29] 05/05/2015  . Long term prescription opiate use [Z79.891] 05/05/2015  . Chronic knee pain (S/P TKR:Total Knee Replacement) (Left) [Z61.096, G89.29] 05/05/2015  . Osteoarthritis of hips (Location of Secondary source of pain) (Bilateral) (Right) [M16.0] 05/05/2015  . History of total knee replacement (Left) [Z96.659] 05/05/2015  . Chronic groin pain (Location of Secondary source of pain) (Right) [R10.31, G89.29] 05/05/2015  . Chronic lower extremity pain (Location of Tertiary source of pain) (Bilateral) (R>L) [M79.606, G89.29] 05/05/2015  . Chronic pain [G89.29] 02/05/2015  . Chronic ankle pain (secondary to a work-related injury) (Date of injury 04/17/2006) (Left) [E45.409, G89.29] 12/25/2014  . Chronic low back pain (Location of Primary Pain) (Bilateral) (R>L) [M54.5, G89.29] 12/25/2014  . Lumbar spondylosis (L3-4 & L4-5) [W11.914] 12/25/2014  . Lumbar facet syndrome (Location of Primary Source of Pain) (Bilateral) (R>L) [M47.816] 12/25/2014  . Encounter for therapeutic drug level monitoring [Z51.81] 11/19/2014  . Encounter for long-term opiate analgesic use [Z79.891] 11/19/2014  . Long-term current use of opiate analgesic [Z79.891] 11/19/2014  . Uncomplicated opioid dependence ([place]) [F11.20] 11/19/2014  . Opiate use (35 MME/Day) [F11.90] 11/19/2014  . Chronic pain syndrome [G89.4] 11/19/2014  .  Diabetic sensory peripheral neuropathy (Bilateral Lower Extremity) [E11.42] 11/19/2014  . Generalized anxiety disorder [F41.1] 11/19/2014  . History of panic attacks [Z86.59] 11/19/2014  . History of tobacco abuse [Z87.891] 11/19/2014  . GERD (gastroesophageal reflux disease) [K21.9] 11/19/2014  . Non-insulin dependent type 2 diabetes mellitus ([place]) [E11.9] 11/19/2014  . Coccygeal pain [M53.3] 11/19/2014  . History of migraine [Z86.69] 11/19/2014  . Chronic constipation [K59.09] 10/24/2013  . Personal history of other diseases of the digestive system [Z87.19] 10/24/2013  . Leg length inequality [M21.70] 09/12/2012  . Morbid obesity ([place]) [E66.01] 05/26/2012  . Hypertension [I10]   . IBS (irritable bowel syndrome) [K58.9]   . Depression, major, recurrent, moderate ([place]) [F33.1]   . ADVERSE DRUG REACTION [T88.7XXA] 12/15/2009    Total Time spent with patient: 30 minutes  Musculoskeletal: Strength & Muscle Tone: within normal limits Gait & Station: normal Patient leans: N/A  Psychiatric Specialty Exam: Review of Systems  Constitutional: Negative for chills and fever.  Respiratory: Negative for cough.   Cardiovascular: Negative for chest pain.  Gastrointestinal: Negative for abdominal pain, heartburn, nausea and vomiting.  Psychiatric/Behavioral: Negative for depression, hallucinations and suicidal ideas.    Blood pressure 94/68, pulse 90, temperature 97.9 F (36.6 C), temperature source Oral, resp. rate 18, height 5' 5.5" (1.664 m), weight 103.4 kg (228 lb).Body mass index is 37.36 kg/m.  General Appearance: Casual and Disheveled  Eye Contact::  Good  Speech:  Clear and Coherent and Normal Rate  Volume:  Normal  Mood:  Euthymic  Affect:  Appropriate and Congruent  Thought Process:  Coherent and Goal Directed  Orientation:  Full (Time, Place, and Person)  Thought Content:  Hallucinations: Auditory  Suicidal Thoughts:  No  Homicidal Thoughts:  No  Memory:  Immediate;    Fair Recent;   Fair Remote;   Fair  Judgement:  Fair  Insight:  Fair  Psychomotor Activity:  Normal  Concentration:  Fair  Recall:  [employer] of Knowledge:Fair  Language: Fair  Akathisia:  No  Handed:    AIMS (if indicated):     Assets:  Communication Skills Resilience Social Support  Sleep:  Number of Hours: 6.5  Cognition: WNL  ADL's:  Intact   Mental Status Per Nursing Assessment::   On Admission:  NA  Demographic Factors:  Caucasian, Low socioeconomic status and Unemployed  Loss Factors: NA  Historical Factors: Impulsivity  Risk Reduction Factors:   Positive social support, Positive therapeutic relationship and Positive coping skills or problem solving skills  Continued Clinical Symptoms:  Depression:   Severe Currently Psychotic  Cognitive Features That Contribute To Risk:  None    Suicide Risk:  Minimal: No identifiable suicidal ideation.  Patients presenting with no risk factors but with morbid ruminations; may be classified as minimal risk based on the severity of the depressive symptoms  Follow-up [facility]. Go on 03/14/2017.   Specialty:  Behavioral Health Why:  Please attend your medication appt on Monday, 03/14/17, at 10:30am with Dr Shea Evans.   Contact information: [place] [phone]        Subjective Data:  Heather Haley is a 66 y/p F with history of MDD with psychotic features (and self reported history of schizophrenia) and multiple medical complaints who was admitted voluntarily from ED with worsening symptoms of AH, tactile hallucinations, delusions, depression, and anxiety. Pt reported feeling as if she has been electrocuted in her own home and she has been hearing AH of clicks which she able to communicate with. She also endorsedparanoia of being followed.Pt agreed to resume her home medications with addition of  trial of haldol,but her symptoms of AH did not improve, so she was transitioned to prolixin and dosage was titrated up. Pt reported improvement of her symptoms of psychosis, and she was started on prolixin decanoate.  Today upon evaluation, pt shares, "They gave me an anxiety pill and it knocked me out." Pt had received vistaril this morning and she would prefer not to receive the medication as an outpatient. Pt reports that overall she is feeling much better. She shares, "There is still some ringing this morning, but I can tell it has gone down a lot from yesterday when I got the shot." She denies SI/HI/VH. She is sleeping well and her appetite is good. She is tolerating her medications without difficulty or side effects. She was able to engage in safety planning including plan to return to [facility] or contact emergency services if she feels unable to maintain her own safety or the safety of others. Pt had no further questions, comments, or concerns.    Plan Of Care/Follow-up recommendations:   - Discharge to outpatient level of care  - MDD, recurrent, severe, with psychotic features vs primary psychotic disorder - Continue prolixin 7.5mg  po qhs             - Continue prolixin decanoate 25mg  IM q21 days (first dose given 03/09/17)  - Anxiety -Discontinuevistaril 50mg  po q6h prn anxiety  - insomnia -Continue benadryl 50mg  po qhs prn insomnia  - DMII -Continuemetformin 500mg  BID  - HTN -Continueverapamil 120mg  qhs  - Migraine headaches -Continuetopamax 100mg  BID  - Chronic pain -Continuetramadol 100mg  q8h prn pain  Activity:  as tolerated Diet:  normal Tests:  NA Other:  see above for [place], MD 03/10/2017, 9:16 AM

## 2017-03-10 NOTE — Discharge Summary (Signed)
Physician Discharge Summary Note  Patient:  Heather Haley is an 57 y.o., female MRN:  [id number] DOB:  October 30, 1960 Patient phone:  [phone] (home)  Patient address:   [address] [address],  Total Time spent with patient: Greater than 30 minutes  Date of Admission:  03/03/2017 Date of Discharge: 03-10-17  Reason for Admission: Worsening symptoms of depression, auditory hallucinations & delusions.  Principal Problem: MDD (major depressive disorder), recurrent, severe, with psychosis [facility])  Discharge Diagnoses: Patient Active Problem List   Diagnosis Date Noted  . MDD (major depressive disorder), recurrent, severe, with psychosis ([place]) [F33.3] 03/03/2017  . Contracture, tendon sheath [M62.40] 03/23/2016  . Dysuria [R30.0] 06/17/2015  . Pure hypercholesterolemia [E78.00] 05/29/2015  . Opioid-induced constipation (OIC) [K59.03, T40.2X5A] 05/19/2015  . Osteoarthritis of hip (Right) [M16.11] 05/08/2015  . Chronic hip pain (Right) [M25.551, G89.29] 05/05/2015  . Long term prescription opiate use [Z79.891] 05/05/2015  . Chronic knee pain (S/P TKR:Total Knee Replacement) (Left) [S28.315, G89.29] 05/05/2015  . Osteoarthritis of hips (Location of Secondary source of pain) (Bilateral) (Right) [M16.0] 05/05/2015  . History of total knee replacement (Left) [Z96.659] 05/05/2015  . Chronic groin pain (Location of Secondary source of pain) (Right) [R10.31, G89.29] 05/05/2015  . Chronic lower extremity pain (Location of Tertiary source of pain) (Bilateral) (R>L) [M79.606, G89.29] 05/05/2015  . Chronic pain [G89.29] 02/05/2015  . Chronic ankle pain (secondary to a work-related injury) (Date of injury 04/17/2006) (Left) [V76.160, G89.29] 12/25/2014  . Chronic low back pain (Location of Primary Pain) (Bilateral) (R>L) [M54.5, G89.29] 12/25/2014  . Lumbar spondylosis (L3-4 & L4-5) [V37.106] 12/25/2014  . Lumbar facet syndrome (Location of Primary Source of Pain) (Bilateral) (R>L)  [M47.816] 12/25/2014  . Encounter for therapeutic drug level monitoring [Z51.81] 11/19/2014  . Encounter for long-term opiate analgesic use [Z79.891] 11/19/2014  . Long-term current use of opiate analgesic [Z79.891] 11/19/2014  . Uncomplicated opioid dependence ([place]) [F11.20] 11/19/2014  . Opiate use (35 MME/Day) [F11.90] 11/19/2014  . Chronic pain syndrome [G89.4] 11/19/2014  . Diabetic sensory peripheral neuropathy (Bilateral Lower Extremity) [E11.42] 11/19/2014  . Generalized anxiety disorder [F41.1] 11/19/2014  . History of panic attacks [Z86.59] 11/19/2014  . History of tobacco abuse [Z87.891] 11/19/2014  . GERD (gastroesophageal reflux disease) [K21.9] 11/19/2014  . Non-insulin dependent type 2 diabetes mellitus ([place]) [E11.9] 11/19/2014  . Coccygeal pain [M53.3] 11/19/2014  . History of migraine [Z86.69] 11/19/2014  . Chronic constipation [K59.09] 10/24/2013  . Personal history of other diseases of the digestive system [Z87.19] 10/24/2013  . Leg length inequality [M21.70] 09/12/2012  . Morbid obesity ([place]) [E66.01] 05/26/2012  . Hypertension [I10]   . IBS (irritable bowel syndrome) [K58.9]   . Depression, major, recurrent, moderate ([place]) [F33.1]   . ADVERSE DRUG REACTION [T88.7XXA] 12/15/2009   Past Psychiatric History: GAD, Major depression with psychosis  Past Medical History:  Past Medical History:  Diagnosis Date  . Anxiety   . Chronic pain    previously saw Dr. Consuela Mimes in pain clinic, then saw pain specialist in [place]  . Depression   . Diabetes mellitus ([place])   . Frequency of urination   . GERD (gastroesophageal reflux disease)   . Headache(784.0)   . High cholesterol   . Hypertension   . IBS (irritable bowel syndrome)   . Left ankle instability   . Left knee DJD   . Lumbar Degenerative Disc Disease of  10/11/2014  . Neuromuscular disorder ([place])   . Osteoarthritis of hip (Right) 05/05/2015  . Other enthesopathy  of ankle and tarsus 12/15/2009   Qualifier:  Diagnosis of  By: Oneida Alar MD, KARL    . Peripheral sensory neuropathy (Bilateral) 11/19/2014  . Postoperative nausea and vomiting     Past Surgical History:  Procedure Laterality Date  . ABDOMINAL HYSTERECTOMY    . ANKLE SURGERY    . APPENDECTOMY    . COLONOSCOPY  2013  . ESOPHAGOGASTRODUODENOSCOPY (EGD) WITH PROPOFOL N/A 10/03/2014   Procedure: ESOPHAGOGASTRODUODENOSCOPY (EGD) WITH PROPOFOL;  Surgeon: Josefine Class, MD;  Location: [facility] ENDOSCOPY;  Service: Endoscopy;  Laterality: N/A;  . KNEE ARTHROSCOPY  1997   left knee  . LEFT HEART CATHETERIZATION WITH CORONARY ANGIOGRAM N/A 01/11/2013   Procedure: LEFT HEART CATHETERIZATION WITH CORONARY ANGIOGRAM;  Surgeon: Sinclair Grooms, MD;  Location: [facility] CATH LAB;  Service: Cardiovascular;  Laterality: N/A;  . TOTAL KNEE ARTHROPLASTY  08/30/2011   Procedure: TOTAL KNEE ARTHROPLASTY;  Surgeon: Lorn Junes, MD;  Location: [facility];  Service: Orthopedics;  Laterality: Left;   Family History:  Family History  Problem Relation Age of Onset  . Cancer Mother   . Hypertension Father   . Heart disease Father    Family Psychiatric  History: See H&P  Social History:  Social History   Substance and Sexual Activity  Alcohol Use No  . Alcohol/week: 0.0 oz     Social History   Substance and Sexual Activity  Drug Use No    Social History   Socioeconomic History  . Marital status: Divorced    Spouse name: None  . Number of children: 2  . Years of education: 33  . Highest education level: None  Social Needs  . Financial resource strain: None  . Food insecurity - worry: None  . Food insecurity - inability: None  . Transportation needs - medical: None  . Transportation needs - non-medical: None  Occupational History    Employer: [employer]  Tobacco Use  . Smoking status: Former Smoker    Packs/day: 2.00    Years: 15.00    Pack years: 30.00    Types: Cigarettes    Last attempt to quit: 02/09/1995    Years since quitting:  22.0  . Smokeless tobacco: Never Used  Substance and Sexual Activity  . Alcohol use: No    Alcohol/week: 0.0 oz  . Drug use: No  . Sexual activity: Not Currently  Other Topics Concern  . None  Social History Narrative   Patient lives at home alone. Patient works at [employer].   Caffeine daily- 2   Right handed.   Education- 95 th grade   Hospital Course: (Per admission assessment):  Heather Haley is a 28 y/p female with history of MDD with psychotic features (and self reported history of schizophrenia) and multiple medical complaints who was admitted voluntarily from ED with worsening symptoms of AH, tactile hallucinations, delusions, depression, and anxiety. Pt reported that she has been feeling as if she has been electrocuted in her own home and she has been hearing AH of clicks which she able to communicate with. She also endorses paranoia of being followed. Upon interview, pt shares, "For a long time I've been feeling like I'm being electrocuted; I was here before but it's picked up a lot since then, and here I am." Pt also endorses AH of "a female and female voice talking," but recently her AH have changed into "clicks" which pt is able to interpret and understand and communication, and she interprets the [facility] as commanding  her to do things and increasing her thoughts that others are following her.  Heather Haley was admitted to the [facility] adult unit with complaints of worsening symptoms of depression with psychotic features. On admission, she was reported what seem to feel like delusional thinking. She was however, evaluated & recommended for  mood stabilization treatments.   During the course of her hospitalization, Heather Haley, was medicated & discharged on, fluphenazine tablets 7.5 mg for mood control, fluphenazine injectable 25 mg/ML IM Q 21 days, Hydroxyzine 50 mg prn for anxiety & Trazodone 100 mg insomnia. She was enrolled & participated in the group counseling sessions being offered & held on this  unit. She was counseled & learned coping skills that should help her cope better & maintain mood stability after discharge. She was resumed on all her pertinent home medications for the other previously existing medical issues that she presented. She tolerated her treatment regimen without any adverse effects reported.   While her treatment was on going, Heather Haley's improvement was monitored by observation & her daily reports of symptom reduction noted.  Her emotional & mental status were monitored by daily self-inventory reports completed by her & the clinical staff. She was evaluated daily by the treatment team for mood stability & the need for continued recovery after discharge. She was offered further treatment options upon discharge on an outpatient basis as noted below. She was provided with all the necessary information needed to make that appointment without problems.  Upon discharge, Heather Haley was both mentally & medically stable. She is currently denying suicidal, homicidal ideation, auditory, visual/tactile hallucinations, delusional thoughts & or paranoia. She [facility] with all personal belongings in no apparent distress. Transportation per family.  Physical Findings: AIMS: Facial and Oral Movements Muscles of Facial Expression: None, normal Lips and Perioral Area: None, normal Jaw: None, normal Tongue: None, normal,Extremity Movements Upper (arms, wrists, hands, fingers): None, normal Lower (legs, knees, ankles, toes): None, normal, Trunk Movements Neck, shoulders, hips: None, normal, Overall Severity Severity of abnormal movements (highest score from questions above): None, normal Incapacitation due to abnormal movements: None, normal Patient's awareness of abnormal movements (rate only patient's report): No Awareness, Dental Status Current problems with teeth and/or dentures?: No Does patient usually wear dentures?: No  CIWA:    COWS:     Musculoskeletal: Strength & Muscle Tone: within  normal limits Gait & Station: normal Patient leans: N/A  Psychiatric Specialty Exam: Physical Exam  Constitutional: She appears well-developed.  HENT:  Head: Normocephalic.  Eyes: Pupils are equal, round, and reactive to light.  Neck: Normal range of motion.  Cardiovascular:  Hx. HTN  Respiratory: Effort normal.  GI: Soft.  Genitourinary:  Genitourinary Comments: Deferred  Musculoskeletal: Normal range of motion.  Neurological: She is alert.  Skin: Skin is warm.    Review of Systems  Constitutional: Negative.   HENT: Negative.   Eyes: Negative.   Respiratory: Negative.   Cardiovascular: Negative.   Gastrointestinal: Negative.   Genitourinary: Negative.   Musculoskeletal: Negative.   Skin: Negative.   Neurological: Negative.   Endo/Heme/Allergies: Negative.   Psychiatric/Behavioral: Positive for depression (Stable), hallucinations (Hx. Psychosis) and suicidal ideas. Negative for memory loss and substance abuse. The patient has insomnia (Stable). The patient is not nervous/anxious.     Blood pressure 94/68, pulse 90, temperature 97.9 F (36.6 C), temperature source Oral, resp. rate 18, height 5' 5.5" (1.664 m), weight 103.4 kg (228 lb).Body mass index is 37.36 kg/m.  See Md's SRA   Have you used any  form of tobacco in the last 30 days? (Cigarettes, Smokeless Tobacco, Cigars, and/or Pipes): No  Has this patient used any form of tobacco in the last 30 days? (Cigarettes, Smokeless Tobacco, Cigars, and/or Pipes): N/A  Blood Alcohol level:  Lab Results  Component Value Date   ETH <10 74/09/1446   Metabolic Disorder Labs:  Lab Results  Component Value Date   HGBA1C 6.2 (H) 03/06/2017   MPG 131.24 03/06/2017   MPG 108 05/17/2015   Lab Results  Component Value Date   PROLACTIN 64.2 (H) 03/06/2017   Lab Results  Component Value Date   CHOL 175 03/06/2017   TRIG 234 (H) 03/06/2017   HDL 69 03/06/2017   CHOLHDL 2.5 03/06/2017   VLDL 47 (H) 03/06/2017   LDLCALC 59  03/06/2017   LDLCALC 93 05/17/2015   See Psychiatric Specialty Exam and Suicide Risk Assessment completed by Attending Physician prior to discharge.  Discharge destination:  Home  Is patient on multiple antipsychotic therapies at discharge:  No   Has Patient had three or more failed trials of antipsychotic monotherapy by history:  No  Recommended Plan for Multiple Antipsychotic Therapies: NA  Allergies as of 03/10/2017      Reactions   Buprenorphine Hcl Other (See Comments)   Unable to void   Codeine Hives   REACTION: hives   Cymbalta [duloxetine Hcl] Other (See Comments)   Altered mental status   Duloxetine Other (See Comments)   Altered mental status Alopecia, visual hallucinations, nightmares   Gabapentin Swelling   Lyrica [pregabalin] Other (See Comments)   Alopecia, visual hallucinations, nightmares Altered mental status Alopecia, visual hallucinations, nightmares Altered mental status   Morphine And Related Other (See Comments)   Unable to void   Nortripytline Hcl [nortriptyline] Other (See Comments)   Hair loss and night mares Hair loss and night mares   Nsaids    REACTION: palpitations, diaphoresis   Penicillin G Nausea And Vomiting   Penicillins    REACTION: upset stomach   Tolmetin Other (See Comments)   REACTION: palpitations, diaphoresis   Wellbutrin [bupropion]    Constipation, mood swings      Medication List    STOP taking these medications   aspirin EC 81 MG tablet   CALCIUM PO   cholecalciferol 1000 units tablet Commonly known as:  VITAMIN D   docusate sodium 100 MG capsule Commonly known as:  COLACE   HYDROcodone-acetaminophen 5-325 MG tablet Commonly known as:  NORCO/VICODIN   pravastatin 40 MG tablet Commonly known as:  PRAVACHOL   ranitidine 150 MG capsule Commonly known as:  ZANTAC   rizatriptan 10 MG tablet Commonly known as:  MAXALT     TAKE these medications     Indication  acetaminophen 500 MG tablet Commonly known  as:  TYLENOL Take 1 tablet (500 mg total) by mouth every 6 (six) hours as needed for mild pain.  Indication:  Fever, Pain   albuterol 108 (90 Base) MCG/ACT inhaler Commonly known as:  PROVENTIL HFA;VENTOLIN HFA Inhale 1-2 puffs into the lungs every 6 (six) hours as needed for wheezing.  Indication:  Asthma   diphenhydrAMINE 50 MG capsule Commonly known as:  BENADRYL Take 1 capsule (50 mg total) by mouth at bedtime as needed for sleep.  Indication:  Insomnia   fluPHENAZine 2.5 MG tablet Commonly known as:  PROLIXIN Take 3 tablets (7.5 mg total) by mouth at bedtime. For mood control  Indication:  Mood control   fluPHENAZine decanoate 25 MG/ML injection Commonly  known as:  PROLIXIN Inject 1 mL (25 mg total) into the muscle every 21 ( twenty-one) days. (Due on 03-30-17):For mood control Start taking on:  03/30/2017  Indication:  Mood control   hydrOXYzine 50 MG tablet Commonly known as:  ATARAX/VISTARIL Take 1 tablet (50 mg total) by mouth every 6 (six) hours as needed for anxiety.  Indication:  Feeling Anxious   metFORMIN 500 MG tablet Commonly known as:  GLUCOPHAGE Take 1 tablet (500 mg total) by mouth 2 (two) times daily with a meal. For diabetes management What changed:  additional instructions  Indication:  Type 2 Diabetes   senna 8.6 MG tablet Commonly known as:  SENOKOT Take 1-4 tablets (8.6-34.4 mg total) by mouth daily as needed for constipation.  Indication:  Constipation   topiramate 100 MG tablet Commonly known as:  TOPAMAX Take 1 tablet (100 mg total) by mouth 2 (two) times daily. For mood control What changed:    how much to take  additional instructions  Indication:  Mood control   traMADol 50 MG tablet Commonly known as:  ULTRAM Take 2 tablets (100 mg total) by mouth every 8 (eight) hours as needed for moderate pain or severe pain.  Indication:  Moderate to Moderately Severe Pain   verapamil 120 MG CR tablet Commonly known as:  CALAN-SR Take 1  tablet (120 mg total) by mouth at bedtime. For high blood pressure What changed:  additional instructions  Indication:  High Blood Pressure of Unknown Cause      Follow-up Information    [facility]. Go on 03/14/2017.   Specialty:  Behavioral Health Why:  Please attend your medication appt on Monday, 03/14/17, at 10:30am with Dr Shea Evans.   Contact information: [address] [place] [phone]         Follow-up recommendations: Activity:  As tolerated Diet: As recommended by your primary care doctor. Keep all scheduled follow-up appointments as recommended.   Comments: Patient is instructed prior to discharge to: Take all medications as prescribed by his/her mental healthcare provider. Report any adverse effects and or reactions from the medicines to his/her outpatient provider promptly. Patient has been instructed & cautioned: To not engage in alcohol and or illegal drug use while on prescription medicines. In the event of worsening symptoms, patient is instructed to call the crisis hotline, 911 and or go to the nearest ED for appropriate evaluation and treatment of symptoms. To follow-up with his/her primary care provider for your other medical issues, concerns and or health care needs.   Signed: Lindell Spar, NP, PMHNP, FNP-BC 03/10/2017, 9:59 AM   Patient seen, Suicide Assessment Completed.  Disposition Plan Reviewed   Heather Haley is a 49 y/p F with history of MDD with psychotic features (and self reported history of schizophrenia) and multiple medical complaints who was admitted voluntarily from ED with worsening symptoms of AH, tactile hallucinations, delusions, depression, and anxiety. Pt reported feeling as if she has been electrocuted in her own home and she has been hearing AH of clicks which she able to communicate with. She also endorsedparanoia of being followed.Pt agreed to  resume her home medications with addition of trial of haldol,but her symptoms of AH did not improve, so she was transitioned to prolixinand dosage was titrated up. Pt reported improvement of her symptoms of psychosis, and she was started on prolixin decanoate.  Today upon evaluation, pt shares, "They gave me an anxiety pill and it knocked  me out." Pt had received vistaril this morning and she would prefer not to receive the medication as an outpatient. Pt reports that overall she is feeling much better. She shares, "There is still some ringing this morning, but I can tell it has gone down a lot from yesterday when I got the shot." She denies SI/HI/VH. She is sleeping well and her appetite is good. She is tolerating her medications without difficulty or side effects. She was able to engage in safety planning including plan to return to [facility] or contact emergency services if she feels unable to maintain her own safety or the safety of others. Pt had no further questions, comments, or concerns.   Plan Of Care/Follow-up recommendations:   - Discharge to outpatient level of care  - MDD, recurrent, severe, with psychotic features vs primary psychotic disorder - Continue prolixin 7.5mg  poqhs - Continue prolixin decanoate 25mg  IM q21 days (first dose given 03/09/17)  - Anxiety -Discontinuevistaril 50mg  po q6h prn anxiety  - insomnia -Continue benadryl 50mg  po qhs prn insomnia  - DMII -Continuemetformin 500mg  BID  - HTN -Continueverapamil 120mg  qhs  - Migraine headaches -Continuetopamax 100mg  BID  - Chronic pain -Continuetramadol 100mg  q8h prn pain  Activity:  as tolerated Diet:  normal Tests:  NA Other:  see above for DC plan  Pennelope Bracken, MD

## 2017-03-10 NOTE — Progress Notes (Signed)
D:  Patient's self inventory sheet, patient sleeps good, no sleep medications.  Good appetite, normal energy level, good concentration.  Denied depression, anxiety, hopeless.  Denied withdrawals, then checked tremors.  Denied SI.  Physical problems, pain, ankle and back, pain medication helpful.  Goal is discharge.   A:  Medications administered per MD orders.  Emotional support and encouragement given patient. R:  Denied SI and HI, contracts for safety.  Denied A/V hallucinations.  Safety maintained with 15 minute checks.

## 2017-03-10 NOTE — Plan of Care (Signed)
Pt attended self esteem recreation therapy session but did not participate.  Victorino Sparrow, LRT/CTRS

## 2017-03-10 NOTE — Progress Notes (Signed)
Recreation Therapy Notes  Date: 03/10/17 Time: 1000 Location: 500 Hall Dayroom  Group Topic: Self-Esteem  Goal Area(s) Addresses:  Patient will successfully identify positive attributes about themselves.  Patient will successfully identify benefit of improved self-esteem.   Behavioral Response: None  Intervention: Construction paper, markers, colored pencils  Activity: Brochure About Me:  Patients were to create a brochure to highlight the things that make them unique and set them apart from everyone else.  Patients could highlight things such as biggest accomplishment, favorite feature, important dates, best quality, etc.  Education:  Self-Esteem, Dentist.   Education Outcome: Acknowledges education/In group clarification offered/Needs additional education  Clinical Observations/Feedback: Pt attended group but did not participate.    Victorino Sparrow, LRT/CTRS     Victorino Sparrow A 03/10/2017 12:13 PM

## 2017-03-14 ENCOUNTER — Ambulatory Visit: Payer: Self-pay | Admitting: Psychiatry

## 2017-03-17 DIAGNOSIS — M62838 Other muscle spasm: Secondary | ICD-10-CM | POA: Diagnosis not present

## 2017-03-17 DIAGNOSIS — R251 Tremor, unspecified: Secondary | ICD-10-CM | POA: Diagnosis not present

## 2017-03-23 DIAGNOSIS — J029 Acute pharyngitis, unspecified: Secondary | ICD-10-CM | POA: Diagnosis not present

## 2017-03-24 ENCOUNTER — Ambulatory Visit: Payer: Medicare HMO | Admitting: Psychiatry

## 2017-03-24 ENCOUNTER — Other Ambulatory Visit: Payer: Self-pay

## 2017-03-24 ENCOUNTER — Encounter: Payer: Self-pay | Admitting: Psychiatry

## 2017-03-24 VITALS — BP 129/84 | HR 84 | Temp 97.6°F | Wt 235.0 lb

## 2017-03-24 DIAGNOSIS — F333 Major depressive disorder, recurrent, severe with psychotic symptoms: Secondary | ICD-10-CM | POA: Diagnosis not present

## 2017-03-24 DIAGNOSIS — F4310 Post-traumatic stress disorder, unspecified: Secondary | ICD-10-CM | POA: Diagnosis not present

## 2017-03-24 DIAGNOSIS — G2119 Other drug induced secondary parkinsonism: Secondary | ICD-10-CM

## 2017-03-24 MED ORDER — ARIPIPRAZOLE 5 MG PO TABS: 5.0000 mg | ORAL_TABLET | Freq: Every day | ORAL | 0 refills | Status: DC

## 2017-03-24 MED ORDER — BENZTROPINE MESYLATE 1 MG PO TABS: 1.0000 mg | ORAL_TABLET | Freq: Two times a day (BID) | ORAL | 1 refills | Status: DC
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
## 2017-03-24 NOTE — Patient Instructions (Signed)
Aripiprazole tablets What is this medicine? ARIPIPRAZOLE (ay ri PIP ray zole) is an atypical antipsychotic. It is used to treat schizophrenia and bipolar disorder, also known as manic-depression. It is also used to treat Tourette's disorder and some symptoms of autism. This medicine may also be used in combination with antidepressants to treat major depressive disorder. This medicine may be used for other purposes; ask your health care provider or pharmacist if you have questions. COMMON BRAND NAME(S): Abilify What should I tell my health care provider before I take this medicine? They need to know if you have any of these conditions: -dehydration -dementia -diabetes -heart disease -history of stroke -low blood counts, like low white cell, platelet, or red cell counts -Parkinson's disease -seizures -suicidal thoughts, plans, or attempt; a previous suicide attempt by you or a family member -an unusual or allergic reaction to aripiprazole, other medicines, foods, dyes, or preservatives -pregnant or trying to get pregnant -breast-feeding How should I use this medicine? Take this medicine by mouth with a glass of water. Follow the directions on the prescription label. You can take this medicine with or without food. Take your doses at regular intervals. Do not take your medicine more often than directed. Do not stop taking except on the advice of your doctor or health care professional. A special MedGuide will be given to you by the pharmacist with each prescription and refill. Be sure to read this information carefully each time. Talk to your pediatrician regarding the use of this medicine in children. While this drug may be prescribed for children as young as 6 years of age for selected conditions, precautions do apply. Overdosage: If you think you have taken too much of this medicine contact a poison control center or emergency room at once. NOTE: This medicine is only for you. Do not share  this medicine with others. What if I miss a dose? If you miss a dose, take it as soon as you can. If it is almost time for your next dose, take only that dose. Do not take double or extra doses. What may interact with this medicine? Do not take this medicine with any of the following medications: -brexpiprazole -cisapride -dofetilide -dronedarone -metoclopramide -pimozide -thioridazine This medicine may also interact with the following medications: -alcohol -carbamazepine -certain medicines for anxiety or sleep -certain medicines for blood pressure -certain medicines for fungal infections like ketoconazole, fluconazole, posaconazole, and itraconazole -clarithromycin -fluoxetine -other medicines that prolong the QT interval (cause an abnormal heart rhythm) -paroxetine -quinidine -rifampin This list may not describe all possible interactions. Give your health care provider a list of all the medicines, herbs, non-prescription drugs, or dietary supplements you use. Also tell them if you smoke, drink alcohol, or use illegal drugs. Some items may interact with your medicine. What should I watch for while using this medicine? Visit your doctor or health care professional for regular checks on your progress. It may be several weeks before you see the full effects of this medicine. Do not suddenly stop taking this medicine. You may need to gradually reduce the dose. Patients and their families should watch out for worsening depression or thoughts of suicide. Also watch out for sudden changes in feelings such as feeling anxious, agitated, panicky, irritable, hostile, aggressive, impulsive, severely restless, overly excited and hyperactive, or not being able to sleep. If this happens, especially at the beginning of antidepressant treatment or after a change in dose, call your health care professional. You may get dizzy or drowsy. Do   not drive, use machinery, or do anything that needs mental  alertness until you know how this medicine affects you. Do not stand or sit up quickly, especially if you are an older patient. This reduces the risk of dizzy or fainting spells. Alcohol can increase dizziness and drowsiness. Avoid alcoholic drinks. This medicine can reduce the response of your body to heat or cold. Dress warm in cold weather and stay hydrated in hot weather. If possible, avoid extreme temperatures like saunas, hot tubs, very hot or cold showers, or activities that can cause dehydration such as vigorous exercise. This medicine may cause dry eyes and blurred vision. If you wear contact lenses you may feel some discomfort. Lubricating drops may help. See your eye doctor if the problem does not go away or is severe. If you notice an increased hunger or thirst, different from your normal hunger or thirst, or if you find that you have to urinate more frequently, you should contact your health care provider as soon as possible. You may need to have your blood sugar monitored. This medicine may cause changes in your blood sugar levels. You should monitor you blood sugar frequently if you have diabetes. There have been reports of uncontrollable and strong urges to gamble, binge eat, shop, and have sex while taking this medicine. If you experience any of these or other uncontrollable and strong urges while taking this medicine, you should report it to your health care provider as soon as possible. What side effects may I notice from receiving this medicine? Side effects that you should report to your doctor or health care professional as soon as possible: -allergic reactions like skin rash, itching or hives, swelling of the face, lips, or tongue -breathing problems -confusion -feeling faint or lightheaded, falls -fever or chills, sore throat -increased hunger or thirst -increased urination -joint pain -muscles pain, spasms -problems with balance, talking, walking -restlessness or need to  keep moving -seizures -suicidal thoughts or other mood changes -trouble swallowing -uncontrollable and excessive urges (examples: gambling, binge eating, shopping, having sex) -uncontrollable head, mouth, neck, arm, or leg movements -unusually weak or tired Side effects that usually do not require medical attention (report to your doctor or health care professional if they continue or are bothersome): -blurred vision -constipation -headache -nausea, vomiting -trouble sleeping -weight gain This list may not describe all possible side effects. Call your doctor for medical advice about side effects. You may report side effects to FDA at 1-800-FDA-1088. Where should I keep my medicine? Keep out of the reach of children. Store at room temperature between 15 and 30 degrees C (59 and 86 degrees F). Throw away any unused medicine after the expiration date. NOTE: This sheet is a summary. It may not cover all possible information. If you have questions about this medicine, talk to your doctor, pharmacist, or health care provider.  2018 Elsevier/Gold Standard (2016-01-11 11:45:05)  

## 2017-03-31 ENCOUNTER — Ambulatory Visit: Payer: Medicare HMO | Admitting: Psychiatry

## 2017-03-31 ENCOUNTER — Other Ambulatory Visit: Payer: Self-pay

## 2017-03-31 ENCOUNTER — Encounter: Payer: Self-pay | Admitting: Psychiatry

## 2017-03-31 VITALS — BP 128/86 | HR 109 | Temp 97.7°F | Wt 233.5 lb

## 2017-03-31 DIAGNOSIS — F4310 Post-traumatic stress disorder, unspecified: Secondary | ICD-10-CM | POA: Diagnosis not present

## 2017-03-31 DIAGNOSIS — G2119 Other drug induced secondary parkinsonism: Secondary | ICD-10-CM | POA: Diagnosis not present

## 2017-03-31 DIAGNOSIS — F333 Major depressive disorder, recurrent, severe with psychotic symptoms: Secondary | ICD-10-CM | POA: Diagnosis not present

## 2017-03-31 MED ORDER — PRAZOSIN HCL 1 MG PO CAPS: 1.0000 mg | ORAL_CAPSULE | Freq: Every day | ORAL | 1 refills | Status: DC

## 2017-03-31 MED ORDER — ARIPIPRAZOLE 10 MG PO TABS: 10.0000 mg | ORAL_TABLET | Freq: Every day | ORAL | 1 refills | Status: DC
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
## 2017-04-01 ENCOUNTER — Encounter: Payer: Self-pay | Admitting: Psychiatry

## 2017-04-04 ENCOUNTER — Telehealth: Payer: Self-pay

## 2017-04-04 NOTE — Telephone Encounter (Signed)
pt called states that he medication is not agreeing with her . pt states that she is off balance, dizzy, and increase of memory loss.

## 2017-04-04 NOTE — Telephone Encounter (Signed)
Called patient and asked her to stop taking Prazosin for side effects.  She agrees with plan.

## 2017-04-05 ENCOUNTER — Other Ambulatory Visit
Admission: RE | Admit: 2017-04-05 | Discharge: 2017-04-05 | Disposition: A | Discharge status: Home / Self Care | Payer: Medicare HMO | Source: Ambulatory Visit | Attending: Psychiatry | Admitting: Psychiatry

## 2017-04-05 ENCOUNTER — Ambulatory Visit: Payer: Medicare HMO | Admitting: Psychiatry

## 2017-04-05 ENCOUNTER — Other Ambulatory Visit: Payer: Self-pay

## 2017-04-05 ENCOUNTER — Encounter: Payer: Self-pay | Admitting: Psychiatry

## 2017-04-05 VITALS — BP 138/96 | HR 119 | Wt 228.8 lb

## 2017-04-05 DIAGNOSIS — F332 Major depressive disorder, recurrent severe without psychotic features: Secondary | ICD-10-CM

## 2017-04-05 DIAGNOSIS — G2119 Other drug induced secondary parkinsonism: Secondary | ICD-10-CM

## 2017-04-05 DIAGNOSIS — F333 Major depressive disorder, recurrent, severe with psychotic symptoms: Secondary | ICD-10-CM | POA: Insufficient documentation

## 2017-04-05 DIAGNOSIS — F4310 Post-traumatic stress disorder, unspecified: Secondary | ICD-10-CM

## 2017-04-05 LAB — COMPREHENSIVE METABOLIC PANEL
ALT: 21 U/L (ref 14–54)
ANION GAP: 10 (ref 5–15)
AST: 27 U/L (ref 15–41)
Albumin: 4.8 g/dL (ref 3.5–5.0)
Alkaline Phosphatase: 88 U/L (ref 38–126)
BILIRUBIN TOTAL: 0.5 mg/dL (ref 0.3–1.2)
BUN: 16 mg/dL (ref 6–20)
CHLORIDE: 105 mmol/L (ref 101–111)
CO2: 24 mmol/L (ref 22–32)
Calcium: 9 mg/dL (ref 8.9–10.3)
Creatinine, Ser: 1.14 mg/dL — ABNORMAL HIGH (ref 0.44–1.00)
GFR, EST NON AFRICAN AMERICAN: 52 mL/min — AB (ref >60)
Glucose, Bld: 156 mg/dL — ABNORMAL HIGH (ref 65–99)
POTASSIUM: 3.7 mmol/L (ref 3.5–5.1)
Sodium: 139 mmol/L (ref 135–145)
TOTAL PROTEIN: 8.3 g/dL — AB (ref 6.5–8.1)

## 2017-04-05 LAB — FOLATE: FOLATE: 14.9 ng/mL (ref >5.9)
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
## 2017-04-06 ENCOUNTER — Encounter: Payer: Self-pay | Admitting: Psychiatry

## 2017-04-06 DIAGNOSIS — M5481 Occipital neuralgia: Secondary | ICD-10-CM | POA: Diagnosis not present

## 2017-04-06 LAB — VITAMIN D 25 HYDROXY (VIT D DEFICIENCY, FRACTURES): VIT D 25 HYDROXY: 27.3 ng/mL — AB (ref 30.0–100.0)

## 2017-04-06 LAB — VITAMIN B12: Vitamin B-12: 239 pg/mL (ref 180–914)

## 2017-04-07 DIAGNOSIS — N760 Acute vaginitis: Secondary | ICD-10-CM | POA: Diagnosis not present

## 2017-04-07 DIAGNOSIS — B9689 Other specified bacterial agents as the cause of diseases classified elsewhere: Secondary | ICD-10-CM | POA: Diagnosis not present

## 2017-04-07 NOTE — Progress Notes (Signed)
Copy of labwork was faxed and confirmed to patient pcp

## 2017-04-07 NOTE — Progress Notes (Signed)
Called and left messages twice on patient voicemail to call our office back for labwork results. If I do not hear anything back from patient today I will mail out a letter.

## 2017-04-07 NOTE — Progress Notes (Signed)
Pt called back . Pt was given result. Pt was advised to contact pcp about labwork result. Pt will be mailed a copy of labwork.

## 2017-04-13 DIAGNOSIS — M5481 Occipital neuralgia: Secondary | ICD-10-CM | POA: Diagnosis not present

## 2017-04-20 ENCOUNTER — Ambulatory Visit: Payer: Medicare HMO | Admitting: Psychiatry

## 2017-04-20 ENCOUNTER — Encounter: Payer: Self-pay | Admitting: Psychiatry

## 2017-04-20 ENCOUNTER — Other Ambulatory Visit: Payer: Self-pay

## 2017-04-20 VITALS — BP 114/77 | HR 82 | Temp 98.9°F | Wt 224.8 lb

## 2017-04-20 DIAGNOSIS — F333 Major depressive disorder, recurrent, severe with psychotic symptoms: Secondary | ICD-10-CM | POA: Diagnosis not present

## 2017-04-20 DIAGNOSIS — F4310 Post-traumatic stress disorder, unspecified: Secondary | ICD-10-CM

## 2017-04-20 DIAGNOSIS — G2119 Other drug induced secondary parkinsonism: Secondary | ICD-10-CM

## 2017-04-20 MED ORDER — BENZTROPINE MESYLATE 1 MG PO TABS: 0.5000 mg | ORAL_TABLET | Freq: Two times a day (BID) | ORAL | 1 refills | Status: DC

## 2017-04-20 MED ORDER — TOPIRAMATE 100 MG PO TABS: 100.0000 mg | ORAL_TABLET | Freq: Every day | ORAL | 1 refills | Status: DC

## 2017-04-20 MED ORDER — ARIPIPRAZOLE 10 MG PO TABS: 15.0000 mg | ORAL_TABLET | Freq: Every day | ORAL | 1 refills | Status: DC

## 2017-04-20 NOTE — Patient Instructions (Signed)
Start taking Abilify 15 mg at bedtime.  That means you take 1-1/2 tablet at bedtime .  Start taking Cogentin half tablet twice a day.  Start taking Topamax 100 mg once at bedtime.
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
## 2017-04-21 ENCOUNTER — Encounter: Payer: Self-pay | Admitting: Psychiatry

## 2017-04-25 ENCOUNTER — Encounter: Payer: Self-pay | Admitting: *Deleted

## 2017-04-25 ENCOUNTER — Encounter
Admission: RE | Disposition: A | Discharge status: Home / Self Care | Payer: Self-pay | Source: Ambulatory Visit | Attending: Unknown Physician Specialty

## 2017-04-25 ENCOUNTER — Ambulatory Visit: Payer: Medicare HMO | Admitting: Anesthesiology

## 2017-04-25 ENCOUNTER — Ambulatory Visit
Admission: RE | Admit: 2017-04-25 | Discharge: 2017-04-25 | Disposition: A | Discharge status: Home / Self Care | Payer: Medicare HMO | Source: Ambulatory Visit | Attending: Unknown Physician Specialty | Admitting: Unknown Physician Specialty

## 2017-04-25 DIAGNOSIS — Z6836 Body mass index (BMI) 36.0-36.9, adult: Secondary | ICD-10-CM | POA: Insufficient documentation

## 2017-04-25 DIAGNOSIS — G8929 Other chronic pain: Secondary | ICD-10-CM | POA: Insufficient documentation

## 2017-04-25 DIAGNOSIS — Z800 Family history of malignant neoplasm of digestive organs: Secondary | ICD-10-CM | POA: Diagnosis not present

## 2017-04-25 DIAGNOSIS — Z87891 Personal history of nicotine dependence: Secondary | ICD-10-CM | POA: Insufficient documentation

## 2017-04-25 DIAGNOSIS — N189 Chronic kidney disease, unspecified: Secondary | ICD-10-CM | POA: Diagnosis not present

## 2017-04-25 DIAGNOSIS — Z7984 Long term (current) use of oral hypoglycemic drugs: Secondary | ICD-10-CM | POA: Insufficient documentation

## 2017-04-25 DIAGNOSIS — Z885 Allergy status to narcotic agent status: Secondary | ICD-10-CM | POA: Diagnosis not present

## 2017-04-25 DIAGNOSIS — K228 Other specified diseases of esophagus: Secondary | ICD-10-CM | POA: Diagnosis not present

## 2017-04-25 DIAGNOSIS — R1314 Dysphagia, pharyngoesophageal phase: Secondary | ICD-10-CM | POA: Insufficient documentation

## 2017-04-25 DIAGNOSIS — Z96652 Presence of left artificial knee joint: Secondary | ICD-10-CM | POA: Insufficient documentation

## 2017-04-25 DIAGNOSIS — E669 Obesity, unspecified: Secondary | ICD-10-CM | POA: Insufficient documentation

## 2017-04-25 DIAGNOSIS — K219 Gastro-esophageal reflux disease without esophagitis: Secondary | ICD-10-CM | POA: Insufficient documentation

## 2017-04-25 DIAGNOSIS — Z886 Allergy status to analgesic agent status: Secondary | ICD-10-CM | POA: Diagnosis not present

## 2017-04-25 DIAGNOSIS — Z79899 Other long term (current) drug therapy: Secondary | ICD-10-CM | POA: Insufficient documentation

## 2017-04-25 DIAGNOSIS — K2970 Gastritis, unspecified, without bleeding: Secondary | ICD-10-CM | POA: Insufficient documentation

## 2017-04-25 DIAGNOSIS — Z888 Allergy status to other drugs, medicaments and biological substances status: Secondary | ICD-10-CM | POA: Diagnosis not present

## 2017-04-25 DIAGNOSIS — K635 Polyp of colon: Secondary | ICD-10-CM | POA: Diagnosis not present

## 2017-04-25 DIAGNOSIS — K621 Rectal polyp: Secondary | ICD-10-CM | POA: Insufficient documentation

## 2017-04-25 DIAGNOSIS — Z1211 Encounter for screening for malignant neoplasm of colon: Secondary | ICD-10-CM | POA: Insufficient documentation

## 2017-04-25 DIAGNOSIS — K210 Gastro-esophageal reflux disease with esophagitis: Secondary | ICD-10-CM | POA: Diagnosis not present

## 2017-04-25 DIAGNOSIS — D128 Benign neoplasm of rectum: Secondary | ICD-10-CM | POA: Diagnosis not present

## 2017-04-25 DIAGNOSIS — I129 Hypertensive chronic kidney disease with stage 1 through stage 4 chronic kidney disease, or unspecified chronic kidney disease: Secondary | ICD-10-CM | POA: Diagnosis not present

## 2017-04-25 DIAGNOSIS — K648 Other hemorrhoids: Secondary | ICD-10-CM | POA: Diagnosis not present

## 2017-04-25 DIAGNOSIS — K2950 Unspecified chronic gastritis without bleeding: Secondary | ICD-10-CM | POA: Diagnosis not present

## 2017-04-25 DIAGNOSIS — E7800 Pure hypercholesterolemia, unspecified: Secondary | ICD-10-CM | POA: Insufficient documentation

## 2017-04-25 DIAGNOSIS — E1122 Type 2 diabetes mellitus with diabetic chronic kidney disease: Secondary | ICD-10-CM | POA: Diagnosis not present

## 2017-04-25 DIAGNOSIS — N182 Chronic kidney disease, stage 2 (mild): Secondary | ICD-10-CM | POA: Diagnosis not present

## 2017-04-25 DIAGNOSIS — Z7982 Long term (current) use of aspirin: Secondary | ICD-10-CM | POA: Insufficient documentation

## 2017-04-25 DIAGNOSIS — Z86010 Personal history of colonic polyps: Secondary | ICD-10-CM | POA: Diagnosis not present

## 2017-04-25 DIAGNOSIS — K317 Polyp of stomach and duodenum: Secondary | ICD-10-CM | POA: Diagnosis not present

## 2017-04-25 DIAGNOSIS — Z880 Allergy status to penicillin: Secondary | ICD-10-CM | POA: Diagnosis not present

## 2017-04-25 DIAGNOSIS — K6389 Other specified diseases of intestine: Secondary | ICD-10-CM | POA: Insufficient documentation

## 2017-04-25 HISTORY — PX: ESOPHAGOGASTRODUODENOSCOPY (EGD) WITH PROPOFOL: SHX5813

## 2017-04-25 HISTORY — PX: COLONOSCOPY WITH PROPOFOL: SHX5780

## 2017-04-25 LAB — GLUCOSE, CAPILLARY: GLUCOSE-CAPILLARY: 105 mg/dL — AB (ref 65–99)

## 2017-04-25 SURGERY — COLONOSCOPY WITH PROPOFOL
Anesthesia: General

## 2017-04-25 MED ORDER — MIDAZOLAM HCL 2 MG/2ML IJ SOLN
INTRAMUSCULAR | Status: AC
  Filled 2017-04-25: qty 2

## 2017-04-25 MED ORDER — EPHEDRINE SULFATE 50 MG/ML IJ SOLN
INTRAMUSCULAR | Status: AC
  Filled 2017-04-25: qty 1

## 2017-04-25 MED ORDER — LIDOCAINE HCL (PF) 2 % IJ SOLN
INTRAMUSCULAR | Status: AC
  Filled 2017-04-25: qty 10

## 2017-04-25 MED ORDER — FENTANYL CITRATE (PF) 100 MCG/2ML IJ SOLN
INTRAMUSCULAR | Status: DC | PRN
  Administered 2017-04-25: 50 ug via INTRAVENOUS

## 2017-04-25 MED ORDER — EPHEDRINE SULFATE 50 MG/ML IJ SOLN
INTRAMUSCULAR | Status: DC | PRN
  Administered 2017-04-25 (×3): 10 mg via INTRAVENOUS

## 2017-04-25 MED ORDER — PROPOFOL 500 MG/50ML IV EMUL
INTRAVENOUS | Status: DC | PRN
  Administered 2017-04-25: 120 ug/kg/min via INTRAVENOUS

## 2017-04-25 MED ORDER — MIDAZOLAM HCL 2 MG/2ML IJ SOLN
INTRAMUSCULAR | Status: DC | PRN
  Administered 2017-04-25: 2 mg via INTRAVENOUS

## 2017-04-25 MED ORDER — SODIUM CHLORIDE 0.9 % IV SOLN: INTRAVENOUS | Status: DC

## 2017-04-25 MED ORDER — FENTANYL CITRATE (PF) 100 MCG/2ML IJ SOLN
INTRAMUSCULAR | Status: AC
  Filled 2017-04-25: qty 2

## 2017-04-25 MED ORDER — PROPOFOL 10 MG/ML IV BOLUS
INTRAVENOUS | Status: AC
  Filled 2017-04-25: qty 20

## 2017-04-25 MED ORDER — PROPOFOL 500 MG/50ML IV EMUL
INTRAVENOUS | Status: AC
  Filled 2017-04-25: qty 50

## 2017-04-25 MED ORDER — SODIUM CHLORIDE 0.9 % IV SOLN
INTRAVENOUS | Status: DC
  Administered 2017-04-25: 11:00:00 via INTRAVENOUS

## 2017-04-25 MED ORDER — PIPERACILLIN-TAZOBACTAM 3.375 G IVPB 30 MIN: 3.3750 g | Freq: Once | INTRAVENOUS | Status: DC

## 2017-04-25 MED ORDER — LIDOCAINE HCL (CARDIAC) 20 MG/ML IV SOLN
INTRAVENOUS | Status: DC | PRN
  Administered 2017-04-25: 30 mg via INTRAVENOUS

## 2017-04-25 NOTE — Anesthesia Post-op Follow-up Note (Signed)
Anesthesia QCDR form completed.        

## 2017-04-25 NOTE — Anesthesia Preprocedure Evaluation (Signed)
Anesthesia Evaluation  Patient identified by MRN, date of birth, ID band Patient awake    Reviewed: Allergy & Precautions, NPO status , Patient's Chart, lab work & pertinent test results  History of Anesthesia Complications (+) PONV and history of anesthetic complications  Airway Mallampati: III  TM Distance: >3 FB Neck ROM: Full    Dental  (+) Poor Dentition   Pulmonary neg sleep apnea, neg COPD, former smoker,    breath sounds clear to auscultation- rhonchi (-) wheezing      Cardiovascular hypertension, Pt. on medications (-) CAD, (-) Past MI, (-) Cardiac Stents and (-) CABG  Rhythm:Regular Rate:Normal - Systolic murmurs and - Diastolic murmurs    Neuro/Psych  Headaches, PSYCHIATRIC DISORDERS Anxiety Depression Schizophrenia    GI/Hepatic Neg liver ROS, GERD  ,  Endo/Other  diabetes, Oral Hypoglycemic Agents  Renal/GU Renal InsufficiencyRenal disease     Musculoskeletal  (+) Arthritis ,   Abdominal (+) + obese,   Peds  Hematology negative hematology ROS (+)   Anesthesia Other Findings Past Medical History: No date: Anxiety No date: Chronic kidney disease No date: Chronic pain     Comment:  previously saw Dr. Consuela Mimes in pain clinic, then saw pain              specialist in [facility] No date: Depression No date: Diabetes mellitus ([place]) No date: Frequency of urination No date: GERD (gastroesophageal reflux disease) No date: Headache(784.0) No date: High cholesterol No date: Hypertension No date: IBS (irritable bowel syndrome) No date: Left ankle instability No date: Left knee DJD 10/11/2014: Lumbar Degenerative Disc Disease of  No date: Neuromuscular disorder ([employer]) 05/05/2015: Osteoarthritis of hip (Right) 12/15/2009: Other enthesopathy of ankle and tarsus     Comment:  Qualifier: Diagnosis of  By: FIELDS MD, KARL   11/19/2014: Peripheral sensory neuropathy (Bilateral) No date: Postoperative nausea and  vomiting No date: Schizophrenia ([place])   Reproductive/Obstetrics                             Anesthesia Physical Anesthesia Plan  ASA: III  Anesthesia Plan: General   Post-op Pain Management:    Induction: Intravenous  PONV Risk Score and Plan: 3 and Propofol infusion  Airway Management Planned: Natural Airway  Additional Equipment:   Intra-op Plan:   Post-operative Plan:   Informed Consent: I have reviewed the patients History and Physical, chart, labs and discussed the procedure including the risks, benefits and alternatives for the proposed anesthesia with the patient or authorized representative who has indicated his/her understanding and acceptance.   Dental advisory given  Plan Discussed with: CRNA and Anesthesiologist  Anesthesia Plan Comments:         Anesthesia Quick Evaluation

## 2017-04-25 NOTE — Anesthesia Procedure Notes (Signed)
Performed by: Cook-Martin, Mi Balla Pre-anesthesia Checklist: Patient identified, Emergency Drugs available, Suction available, Patient being monitored and Timeout performed Patient Re-evaluated:Patient Re-evaluated prior to induction Oxygen Delivery Method: Nasal cannula Preoxygenation: Pre-oxygenation with 100% oxygen Induction Type: IV induction Airway Equipment and Method: Bite block Placement Confirmation: CO2 detector and positive ETCO2       

## 2017-04-25 NOTE — Transfer of Care (Signed)
Immediate Anesthesia Transfer of Care Note  Patient: Heather Haley  Procedure(s) Performed: COLONOSCOPY WITH PROPOFOL (N/A ) ESOPHAGOGASTRODUODENOSCOPY (EGD) WITH PROPOFOL  Patient Location: PACU  Anesthesia Type:General  Level of Consciousness: awake and sedated  Airway & Oxygen Therapy: Patient Spontanous Breathing and Patient connected to nasal cannula oxygen  Post-op Assessment: Report given to RN and Post -op Vital signs reviewed and stable  Post vital signs: Reviewed and stable  Last Vitals:  Vitals:   04/25/17 1050  BP: 135/88  Pulse: 78  Resp: 16  Temp: (!) 35.9 C  SpO2: 100%    Last Pain:  Vitals:   04/25/17 1050  TempSrc: Tympanic         Complications: No apparent anesthesia complications

## 2017-04-25 NOTE — H&P (Signed)
Primary Care Physician:  Glendon Axe, MD Primary Gastroenterologist:  Dr. Vira Agar  Pre-Procedure History & Physical: HPI:  Heather Haley is a 57 y.o. female is here for an endoscopy and colonoscopy.  The colon was for [facility] colon polyps and the EGD for heartburn and dysphagia.   Past Medical History:  Diagnosis Date  . Anxiety   . Chronic kidney disease   . Chronic pain    previously saw Dr. Consuela Mimes in pain clinic, then saw pain specialist in [place]  . Depression   . Diabetes mellitus ([place])   . Frequency of urination   . GERD (gastroesophageal reflux disease)   . Headache(784.0)   . High cholesterol   . Hypertension   . IBS (irritable bowel syndrome)   . Left ankle instability   . Left knee DJD   . Lumbar Degenerative Disc Disease of  10/11/2014  . Neuromuscular disorder ([place])   . Osteoarthritis of hip (Right) 05/05/2015  . Other enthesopathy of ankle and tarsus 12/15/2009   Qualifier: Diagnosis of  By: Oneida Alar MD, KARL    . Peripheral sensory neuropathy (Bilateral) 11/19/2014  . Postoperative nausea and vomiting   . Schizophrenia [facility])     Past Surgical History:  Procedure Laterality Date  . ABDOMINAL HYSTERECTOMY    . ANKLE SURGERY    . APPENDECTOMY    . COLONOSCOPY  2013  . ESOPHAGOGASTRODUODENOSCOPY (EGD) WITH PROPOFOL N/A 10/03/2014   Procedure: ESOPHAGOGASTRODUODENOSCOPY (EGD) WITH PROPOFOL;  Surgeon: Josefine Class, MD;  Location: [facility] ENDOSCOPY;  Service: Endoscopy;  Laterality: N/A;  . KNEE ARTHROSCOPY  1997   left knee  . LEFT HEART CATHETERIZATION WITH CORONARY ANGIOGRAM N/A 01/11/2013   Procedure: LEFT HEART CATHETERIZATION WITH CORONARY ANGIOGRAM;  Surgeon: Sinclair Grooms, MD;  Location: [facility] CATH LAB;  Service: Cardiovascular;  Laterality: N/A;  . TOTAL KNEE ARTHROPLASTY  08/30/2011   Procedure: TOTAL KNEE ARTHROPLASTY;  Surgeon: Lorn Junes, MD;  Location: [place];  Service: Orthopedics;  Laterality: Left;    Prior to Admission medications    Medication Sig Start Date End Date Taking? Authorizing Provider  ASPIRIN PO Take by mouth.   Yes [provider]  benztropine (COGENTIN) 1 MG tablet Take 0.5 tablets (0.5 mg total) by mouth 2 (two) times daily. 04/20/17  Yes Ursula Alert, MD  Cholecalciferol (VITAMIN D PO) Take by mouth.   Yes [provider]  metFORMIN (GLUCOPHAGE) 500 MG tablet Take 1 tablet (500 mg total) by mouth 2 (two) times daily with a meal. For diabetes management 03/10/17  Yes Lindell Spar I, NP  pravastatin (PRAVACHOL) 40 MG tablet Take 40 mg by mouth daily.   Yes [provider]  ranitidine (ZANTAC) 150 MG tablet Take 150 mg by mouth 2 (two) times daily.   Yes [provider]  rizatriptan (MAXALT) 10 MG tablet Take 10 mg by mouth as needed for migraine. May repeat in 2 hours if needed   Yes [provider]  traMADol (ULTRAM) 50 MG tablet Take 2 tablets (100 mg total) by mouth every 8 (eight) hours as needed for moderate pain or severe pain. 03/10/17  Yes Lindell Spar I, NP  verapamil (CALAN-SR) 120 MG CR tablet Take 1 tablet (120 mg total) by mouth at bedtime. For high blood pressure 03/10/17  Yes Nwoko, Herbert Pun I, NP  acetaminophen (TYLENOL) 500 MG tablet Take 1 tablet (500 mg total) by mouth every 6 (six) hours as needed for mild pain. 03/10/17   Lindell Spar  I, NP  albuterol (PROVENTIL HFA;VENTOLIN HFA) 108 (90 Base) MCG/ACT inhaler Inhale 1-2 puffs into the lungs every 6 (six) hours as needed for wheezing. 03/10/17 03/10/18  Lindell Spar I, NP  albuterol (PROVENTIL HFA;VENTOLIN HFA) 108 (90 Base) MCG/ACT inhaler Inhale into the lungs every 6 (six) hours as needed for wheezing or shortness of breath.    [provider]  ARIPiprazole (ABILIFY) 10 MG tablet Take 1.5 tablets (15 mg total) by mouth at bedtime. 04/20/17   Ursula Alert, MD  Docusate Sodium (COLACE PO) Take by mouth.    [provider]  HYDROcodone-acetaminophen (NORCO/VICODIN) 5-325 MG tablet Take 1  tablet by mouth every 6 (six) hours as needed for moderate pain.    [provider]  senna (SENOKOT) 8.6 MG tablet Take 1-4 tablets (8.6-34.4 mg total) by mouth daily as needed for constipation. 03/10/17   Lindell Spar I, NP  topiramate (TOPAMAX) 100 MG tablet Take 1 tablet (100 mg total) by mouth at bedtime. For mood control 04/20/17   Ursula Alert, MD  Triprolidine-Pseudoephedrine (ANTIHISTAMINE PO) Take by mouth.    [provider]    Allergies as of 03/14/2017 - Review Complete 03/03/2017  Allergen Reaction Noted  . Buprenorphine hcl Other (See Comments) 05/05/2015  . Codeine Hives 09/04/2009  . Cymbalta [duloxetine hcl] Other (See Comments) 06/06/2014  . Duloxetine Other (See Comments) 02/19/2015  . Gabapentin Swelling 02/19/2015  . Lyrica [pregabalin] Other (See Comments) 06/06/2014  . Morphine and related Other (See Comments) 10/03/2014  . Nortripytline hcl [nortriptyline] Other (See Comments) 12/12/2012  . Nsaids  09/04/2009  . Penicillin g Nausea And Vomiting 05/05/2015  . Penicillins  09/04/2009  . Tolmetin Other (See Comments) 05/05/2015  . Wellbutrin [bupropion]  06/03/2016    Family History  Problem Relation Age of Onset  . Cancer Mother   . Hypertension Father   . Heart disease Father   . Alcohol abuse Father     Social History   Socioeconomic History  . Marital status: Divorced    Spouse name: Not on file  . Number of children: 2  . Years of education: 21  . Highest education level: 11th grade  Social Needs  . Financial resource strain: Somewhat hard  . Food insecurity - worry: Never true  . Food insecurity - inability: Never true  . Transportation needs - medical: No  . Transportation needs - non-medical: No  Occupational History    Employer: [employer]  Tobacco Use  . Smoking status: Former Smoker    Packs/day: 2.00    Years: 15.00    Pack years: 30.00    Types: Cigarettes    Last attempt to quit: 02/09/1995    Years since  quitting: 22.2  . Smokeless tobacco: Never Used  Substance and Sexual Activity  . Alcohol use: No    Alcohol/week: 0.0 oz  . Drug use: No  . Sexual activity: Not Currently  Other Topics Concern  . Not on file  Social History Narrative   Patient lives at home alone. Patient works at [employer].   Caffeine daily- 2   Right handed.   Education- 11 th grade    Review of Systems: See HPI, otherwise negative ROS  Physical Exam: BP 135/88   Pulse 78   Temp (!) 96.6 F (35.9 C) (Tympanic)   Resp 16   Ht 5' 5.5" (1.664 m)   Wt 101.6 kg (224 lb)   SpO2 100%   BMI 36.71 kg/m  General:  Alert,  pleasant and cooperative in NAD Head:  Normocephalic and atraumatic. Neck:  Supple; no masses or thyromegaly. Lungs:  Clear throughout to auscultation.    Heart:  Regular rate and rhythm. Abdomen:  Soft, nontender and nondistended. Normal bowel sounds, without guarding, and without rebound.   Neurologic:  Alert and  oriented x4;  grossly normal neurologically.  Impression/Plan: Heather Haley is here for an endoscopy and colonoscopy to be performed for [facility] colon polyps and [facility] colon cancer  Risks, benefits, limitations, and alternatives regarding  endoscopy and colonoscopy have been reviewed with the patient.  Questions have been answered.  All parties agreeable.   Gaylyn Cheers, MD  04/25/2017, 11:14 AM

## 2017-04-25 NOTE — Op Note (Signed)
[facility] Gastroenterology Patient Name: Heather Haley Procedure Date: 04/25/2017 11:04 AM MRN: [id number] Account #: [id number] Date of Birth: [birth year] Admit Type: Outpatient Age: 57 Room: [facility] ENDO ROOM 1 Gender: Female Note Status: Finalized Procedure:            Colonoscopy Indications:          High risk colon cancer surveillance: Personal history                        of colonic polyps Providers:            Manya Silvas, MD Referring MD:         No Local Md, MD (Referring MD) Medicines:            Propofol per Anesthesia Complications:        No immediate complications. Procedure:            Pre-Anesthesia Assessment:                       - After reviewing the risks and benefits, the patient                        was deemed in satisfactory condition to undergo the                        procedure.                       After obtaining informed consent, the colonoscope was                        passed under direct vision. Throughout the procedure,                        the patient's blood pressure, pulse, and oxygen                        saturations were monitored continuously. The                        Colonoscope was introduced through the anus and                        advanced to the the cecum, identified by appendiceal                        orifice and ileocecal valve. The colonoscopy was                        somewhat difficult due to a lot of stool that was not                        washed out. Successful completion of the procedure was                        aided by lavage. Findings:      A diminutive polyp was found in the rectum. The polyp was sessile. The       polyp was removed with a jumbo cold forceps. Resection and retrieval       were complete.  An area of mildly congested mucosa was found in the distal descending       colon. This was biopsied with a cold forceps for histology.      The exam was otherwise  without abnormality. Impression:           - One diminutive polyp in the rectum, removed with a                        jumbo cold forceps. Resected and retrieved.                       - Congested mucosa in the distal descending colon.                        Biopsied.                       - The examination was otherwise normal. Recommendation:       - Await pathology results. Manya Silvas, MD 04/25/2017 12:07:01 PM This report has been signed electronically. Number of Addenda: 0 Note Initiated On: 04/25/2017 11:04 AM Scope Withdrawal Time: 0 hours 12 minutes 36 seconds  Total Procedure Duration: 0 hours 22 minutes 13 seconds       [facility]

## 2017-04-25 NOTE — Op Note (Signed)
[facility] Gastroenterology Patient Name: Heather Haley Procedure Date: 04/25/2017 11:10 AM MRN: [id number] Account #: [id number] Date of Birth: Oct 24, 1960 Admit Type: Outpatient Age: 57 Room: [facility] ENDO ROOM 1 Gender: Female Note Status: Finalized Procedure:            Upper GI endoscopy Indications:          Esophageal dysphagia Providers:            Manya Silvas, MD Medicines:            Propofol per Anesthesia Complications:        No immediate complications. Procedure:            Pre-Anesthesia Assessment:                       - After reviewing the risks and benefits, the patient                        was deemed in satisfactory condition to undergo the                        procedure.                       - After reviewing the risks and benefits, the patient                        was deemed in satisfactory condition to undergo the                        procedure.                       After obtaining informed consent, the endoscope was                        passed under direct vision. Throughout the procedure,                        the patient's blood pressure, pulse, and oxygen                        saturations were monitored continuously. The Endoscope                        was introduced through the mouth, and advanced to the                        second part of duodenum. The upper GI endoscopy was                        accomplished without difficulty. The patient tolerated                        the procedure well. Findings:      A few polyps with no bleeding were found 40 cm from the incisors. his is       the cardia side of the GEJ. Biopsies were taken with a cold forceps for       histology. Esophagus was otherwise normal.      Diffuse and patchy mild inflammation characterized by erythema and  granularity was found in the gastric body and in the gastric antrum.       Biopsies were taken with a cold forceps for histology.  Biopsies were       taken with a cold forceps for Helicobacter pylori testing.      The examined duodenum was normal. Impression:           - Esophageal polyp(s) were found. Biopsied.                       - Gastritis. Biopsied.                       - Normal examined duodenum. Recommendation:       - Await pathology results. Manya Silvas, MD 04/25/2017 11:38:25 AM This report has been signed electronically. Number of Addenda: 0 Note Initiated On: 04/25/2017 11:10 AM      [facility]

## 2017-04-25 NOTE — Anesthesia Postprocedure Evaluation (Signed)
Anesthesia Post Note  Patient: Heather Haley  Procedure(s) Performed: COLONOSCOPY WITH PROPOFOL (N/A ) ESOPHAGOGASTRODUODENOSCOPY (EGD) WITH PROPOFOL  Patient location during evaluation: Endoscopy Anesthesia Type: General Level of consciousness: awake and alert and oriented Pain management: pain level controlled Vital Signs Assessment: post-procedure vital signs reviewed and stable Respiratory status: spontaneous breathing, nonlabored ventilation and respiratory function stable Cardiovascular status: blood pressure returned to baseline and stable Postop Assessment: no signs of nausea or vomiting Anesthetic complications: no     Last Vitals:  Vitals:   04/25/17 1217 04/25/17 1227  BP: 114/84 107/75  Pulse: 78 79  Resp: 16 18  Temp:    SpO2: 100% 93%    Last Pain:  Vitals:   04/25/17 1207  TempSrc: Tympanic                 Kadin Canipe

## 2017-04-26 ENCOUNTER — Telehealth: Payer: Self-pay

## 2017-04-26 ENCOUNTER — Encounter: Payer: Self-pay | Admitting: Unknown Physician Specialty

## 2017-04-26 NOTE — Telephone Encounter (Signed)
called pharmacy.  no pa was needed they had two rx and needed to know which rx was correct.  so info was given to pharmacy and they will send out today.

## 2017-04-26 NOTE — Telephone Encounter (Signed)
yesterday while i was out a note was left on my desk that pt moved her happt out another week due to not started new meds pa on meds.

## 2017-04-26 NOTE — Telephone Encounter (Signed)
thanks

## 2017-04-27 LAB — SURGICAL PATHOLOGY

## 2017-04-28 ENCOUNTER — Ambulatory Visit: Payer: Medicare HMO | Admitting: Psychiatry

## 2017-04-29 DIAGNOSIS — G43719 Chronic migraine without aura, intractable, without status migrainosus: Secondary | ICD-10-CM | POA: Diagnosis not present

## 2017-04-29 DIAGNOSIS — R413 Other amnesia: Secondary | ICD-10-CM | POA: Diagnosis not present

## 2017-04-29 DIAGNOSIS — G2119 Other drug induced secondary parkinsonism: Secondary | ICD-10-CM | POA: Diagnosis not present

## 2017-05-04 DIAGNOSIS — E119 Type 2 diabetes mellitus without complications: Secondary | ICD-10-CM | POA: Diagnosis not present

## 2017-05-04 DIAGNOSIS — N183 Chronic kidney disease, stage 3 (moderate): Secondary | ICD-10-CM | POA: Diagnosis not present

## 2017-05-04 DIAGNOSIS — E7800 Pure hypercholesterolemia, unspecified: Secondary | ICD-10-CM | POA: Diagnosis not present

## 2017-05-04 DIAGNOSIS — I10 Essential (primary) hypertension: Secondary | ICD-10-CM | POA: Diagnosis not present

## 2017-05-06 ENCOUNTER — Ambulatory Visit: Payer: Medicare HMO | Admitting: Psychiatry

## 2017-05-06 ENCOUNTER — Encounter: Payer: Self-pay | Admitting: Psychiatry

## 2017-05-06 ENCOUNTER — Other Ambulatory Visit: Payer: Self-pay

## 2017-05-06 VITALS — BP 129/84 | HR 87 | Temp 97.5°F | Wt 225.8 lb

## 2017-05-06 DIAGNOSIS — E538 Deficiency of other specified B group vitamins: Secondary | ICD-10-CM

## 2017-05-06 DIAGNOSIS — G2119 Other drug induced secondary parkinsonism: Secondary | ICD-10-CM | POA: Diagnosis not present

## 2017-05-06 DIAGNOSIS — R413 Other amnesia: Secondary | ICD-10-CM | POA: Diagnosis not present

## 2017-05-06 DIAGNOSIS — E559 Vitamin D deficiency, unspecified: Secondary | ICD-10-CM | POA: Diagnosis not present

## 2017-05-06 DIAGNOSIS — T50905A Adverse effect of unspecified drugs, medicaments and biological substances, initial encounter: Secondary | ICD-10-CM

## 2017-05-06 DIAGNOSIS — F333 Major depressive disorder, recurrent, severe with psychotic symptoms: Secondary | ICD-10-CM | POA: Diagnosis not present

## 2017-05-06 MED ORDER — AMANTADINE HCL 100 MG PO CAPS: 100.0000 mg | ORAL_CAPSULE | Freq: Two times a day (BID) | ORAL | 1 refills | Status: DC

## 2017-05-06 MED ORDER — TOPIRAMATE 100 MG PO TABS: 50.0000 mg | ORAL_TABLET | Freq: Every day | ORAL | 1 refills | Status: DC

## 2017-05-06 NOTE — Patient Instructions (Signed)
Amantadine capsules or tablets What is this medicine? AMANTADINE (a MAN ta deen) is an antiviral medicine. It is used to prevent and to treat a specific type of flu called influenza A. It will not work for colds, other types of flu, or other viral infections. This medicine is also used to treat Parkinson's disease and other movement disorders. This medicine may be used for other purposes; ask your health care provider or pharmacist if you have questions. COMMON BRAND NAME(S): Symmetrel What should I tell my health care provider before I take this medicine? They need to know if you have any of these conditions: -depression or other mental illness -eczema -glaucoma -heart failure -if you drink alcohol -kidney disease -low blood pressure -narcolepsy -seizures -sleep apnea -suicidal thoughts, plans, or attempt; a previous suicide attempt by you or a family member -an unusual or allergic reaction to amantadine, other medicines, foods, dyes, or preservatives -pregnant or trying to get pregnant -breast-feeding How should I use this medicine? Take this medicine by mouth with a full glass of water. Follow the directions on the prescription label. Take your medicine at regular intervals. Do not take your medicine more often than directed. Take all of your medicine as directed even if you think your are better. Do not skip doses or stop your medicine early. Contact your pediatrician or health care professional regarding the useof this medicine in children. While this drug may be prescribed for children as young as 75 year old for selected conditions, precautions do apply. Patients over 69 years old may have a stronger reaction and need a smaller dose. Overdosage: If you think you have taken too much of this medicine contact a poison control center or emergency room at once. NOTE: This medicine is only for you. Do not share this medicine with others. What if I miss a dose? If you miss a dose, take it as  soon as you can. If it is almost time for your next dose, take only that dose. Do not take double or extra doses. What may interact with this medicine? -acetazolamide -alcohol -atropine -antihistamines for allergy, cough and cold -benztropine -bupropion -certain medicines for bladder problems like oxybutynin, tolterodine -certain medicines for stomach problems like dicyclomine, hyoscyamine -certain medicines for travel sickness like scopolamine -ipratropium -methazolamide -quinidine -quinine -sodium bicarbonate -some flu vaccines -thioridazine -trihexyphenidyl This list may not describe all possible interactions. Give your health care provider a list of all the medicines, herbs, non-prescription drugs, or dietary supplements you use. Also tell them if you smoke, drink alcohol, or use illegal drugs. Some items may interact with your medicine. What should I watch for while using this medicine? Tell your doctor or health care professional if your symptoms do not improve. You may get drowsy or dizzy. Do not drive, use machinery, or do anything that needs mental alertness until you know how this medicine affects you. Do not stand or sit up quickly, especially if you are an older patient. This reduces the risk of dizzy or fainting spells. Alcohol may interfere with the effect of this medicine. Avoid alcoholic drinks. If you are taking this medicine for Parkinson's disease or a movement disorder, be careful. Slowly increase your daily activities as your condition improves. Do not suddenly stop taking your medicine because you may develop a severe reaction. You may get dry mouth or eyes, or blurry vision while taking this medicine. Try sugarless gum or hard candy, and drink 6 to 8 glasses of water daily. Brush and floss  your teeth regularly and carefully to avoid teeth and gum problems. You may want to wet your eyes with lubricating eye drops. Talk to your doctor if these symptoms become a  problem. There have been reports of increased sexual urges or other strong urges such as gambling while taking some medicines for Parkinson's disease. If you experience any of these urges while taking this medicine, you should report it to your health care provider as soon as possible. You should check your skin often for changes to moles and new growths while taking this medicine. Call your doctor if you notice any of these changes. What side effects may I notice from receiving this medicine? Side effects that you should report to your doctor or health care professional as soon as possible: -allergic reactions like skin rash, itching or hives, swelling of the face, lips, or tongue -anxiety -breathing problems -changes in vision -color changes on the skin -confusion -depressed mood -eye pain -falling asleep during normal activities like driving -hallucination, loss of contact with reality -new or increased gambling urges, sexual urges, uncontrolled spending, binge or compulsive eating, or other urges -seizures -signs and symptoms of low blood pressure like dizziness; feeling faint or lightheaded, falls; unusually weak or tired -swelling in your legs and feet -suicidal thoughts or other mood changes -trouble passing urine or change in the amount of urine -trouble sleeping -uncontrolled movements of the mouth, head, hands, feet, shoulders, eyelids or other unusual muscle movements Side effects that usually do not require medical attention (report these to your doctor or health care professional if they continue or are bothersome): -constipation -dizziness -drowsiness -dry mouth -headache -nausea This list may not describe all possible side effects. Call your doctor for medical advice about side effects. You may report side effects to FDA at 1-800-FDA-1088. Where should I keep my medicine? Keep out of the reach of children. Store at room temperature between 20 and 25 degrees C (68 and  77 degrees F). Keep container tightly closed. Throw away any unused medicine after the expiration date. NOTE: This sheet is a summary. It may not cover all possible information. If you have questions about this medicine, talk to your doctor, pharmacist, or health care provider.  2018 Elsevier/Gold Standard (2015-10-10 12:06:00)
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
## 2017-05-09 ENCOUNTER — Telehealth: Payer: Self-pay

## 2017-05-09 NOTE — Telephone Encounter (Signed)
NO , PLEASE ASK PHARMACY FOR GENERIC VERSION. IF NOT ASK HER TO COME BACK IN AND I CAN SWITCH HER ABILIFY TO SOMETHING ELSE

## 2017-05-09 NOTE — Telephone Encounter (Signed)
pt called states that she can not afford the symmetrel medication is there anything cheaper she can take?

## 2017-05-10 NOTE — Telephone Encounter (Signed)
Pt was made an appt for Thursday  05-12-2017

## 2017-05-11 DIAGNOSIS — R202 Paresthesia of skin: Secondary | ICD-10-CM | POA: Diagnosis not present

## 2017-05-12 ENCOUNTER — Ambulatory Visit: Payer: Medicare HMO | Admitting: Psychiatry

## 2017-05-18 DIAGNOSIS — Z8619 Personal history of other infectious and parasitic diseases: Secondary | ICD-10-CM | POA: Diagnosis not present

## 2017-05-18 DIAGNOSIS — E538 Deficiency of other specified B group vitamins: Secondary | ICD-10-CM | POA: Diagnosis not present

## 2017-05-25 ENCOUNTER — Other Ambulatory Visit: Payer: Self-pay

## 2017-05-25 ENCOUNTER — Encounter: Payer: Self-pay | Admitting: Psychiatry

## 2017-05-25 ENCOUNTER — Ambulatory Visit: Payer: Medicare HMO | Admitting: Psychiatry

## 2017-05-25 VITALS — BP 127/80 | HR 84 | Temp 97.5°F | Ht 65.0 in | Wt 235.8 lb

## 2017-05-25 DIAGNOSIS — R413 Other amnesia: Secondary | ICD-10-CM | POA: Diagnosis not present

## 2017-05-25 DIAGNOSIS — F4310 Post-traumatic stress disorder, unspecified: Secondary | ICD-10-CM | POA: Diagnosis not present

## 2017-05-25 DIAGNOSIS — F333 Major depressive disorder, recurrent, severe with psychotic symptoms: Secondary | ICD-10-CM | POA: Diagnosis not present

## 2017-05-25 DIAGNOSIS — G2119 Other drug induced secondary parkinsonism: Secondary | ICD-10-CM | POA: Diagnosis not present

## 2017-05-25 MED ORDER — ARIPIPRAZOLE 10 MG PO TABS: 15.0000 mg | ORAL_TABLET | Freq: Every day | ORAL | 1 refills | Status: DC

## 2017-05-25 MED ORDER — TRIHEXYPHENIDYL HCL 5 MG PO TABS: 5.0000 mg | ORAL_TABLET | Freq: Three times a day (TID) | ORAL | 1 refills | Status: DC

## 2017-05-25 NOTE — Patient Instructions (Signed)
Trihexyphenidyl tablets  What is this medicine? TRIHEXYPHENIDYL (trye hex ee FEN i dil) is for Parkinsonism or for movement problems caused by certain drugs. This medicine may be used for other purposes; ask your health care provider or pharmacist if you have questions. COMMON BRAND NAME(S): Artane What should I tell my health care provider before I take this medicine? They need to know if you have any of these conditions: -glaucoma -heart disease -high blood pressure -kidney disease -liver disease -prostate problems -an unusual or allergic reaction to trihexyphenidyl, other medicines, lactose, foods, dyes, or preservatives -pregnant or trying to get pregnant -breast-feeding How should I use this medicine? Take this medicine by mouth with a full glass of water. Follow the directions on the prescription label. Take your medicine at regular intervals. Do not take your medicine more often than directed. Do not suddenly stop taking your medicine because you may develop a severe reaction. Talk to your pediatrician regarding the use of this medicine in children. Special care may be needed. Patients over 65 years old may have a stronger reaction and need a smaller dose. Overdosage: If you think you have taken too much of this medicine contact a poison control center or emergency room at once. NOTE: This medicine is only for you. Do not share this medicine with others. What if I miss a dose? If you miss a dose, take it as soon as you can. If it is almost time for your next dose, take only that dose. Do not take double or extra doses. What may interact with this medicine? -benztropine -drugs for bladder problems -drugs for breathing problems like ipratropium and tiotropium -drugs for certain stomach or intestine problems like propantheline, homatropine methylbromide, glycopyrrolate, atropine, belladonna, and dicyclomine -levodopa -scopolamine This list may not describe all possible  interactions. Give your health care provider a list of all the medicines, herbs, non-prescription drugs, or dietary supplements you use. Also tell them if you smoke, drink alcohol, or use illegal drugs. Some items may interact with your medicine. What should I watch for while using this medicine? Your doctor or health care professional may want you to have eye exams while you are taking this medicine. Your mouth may get dry. Chewing sugarless gum or sucking hard candy, and drinking plenty of water may help. Contact your doctor if the problem does not go away or is severe. This medicine may cause dry eyes and blurred vision. If you wear contact lenses you may feel some discomfort. Lubricating drops may help. See your eye doctor if the problem does not go away or is severe. You may get drowsy or dizzy. Do not drive, use machinery, or do anything that needs mental alertness until you know how this medicine affects you. Do not stand or sit up quickly, especially if you are an older patient. This reduces the risk of dizzy or fainting spells. Alcohol may interfere with the effect of this medicine. Avoid alcoholic drinks. What side effects may I notice from receiving this medicine? Side effects that you should report to your doctor or health care professional as soon as possible: -allergic reactions like skin rash, itching or hives, swelling of the face, lips, or tongue -changes in vision -fast or irregular heartbeat -hallucinations -vomiting Side effects that usually do not require medical attention (report to your doctor or health care professional if they continue or are bothersome): -anxiety or nervousness -dizziness -drowsiness -nausea This list may not describe all possible side effects. Call your doctor for medical   advice about side effects. You may report side effects to FDA at 1-800-FDA-1088. Where should I keep my medicine? Keep out of the reach of children. Store at room temperature between  15 and 30 degrees C (59 and 86 degrees F). Throw away any unused medicine after the expiration date. NOTE: This sheet is a summary. It may not cover all possible information. If you have questions about this medicine, talk to your doctor, pharmacist, or health care provider.  2018 Elsevier/Gold Standard (2007-10-12 11:19:06)  
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
## 2017-06-09 ENCOUNTER — Other Ambulatory Visit: Payer: Self-pay | Admitting: Internal Medicine

## 2017-06-09 DIAGNOSIS — Z1231 Encounter for screening mammogram for malignant neoplasm of breast: Secondary | ICD-10-CM

## 2017-06-13 ENCOUNTER — Other Ambulatory Visit: Payer: Self-pay

## 2017-06-13 ENCOUNTER — Encounter (HOSPITAL_COMMUNITY): Payer: Self-pay | Admitting: *Deleted

## 2017-06-13 ENCOUNTER — Emergency Department (HOSPITAL_COMMUNITY)
Admission: EM | Admit: 2017-06-13 | Discharge: 2017-06-14 | Disposition: A | Discharge status: Home / Self Care | Payer: Medicare HMO | Attending: Emergency Medicine | Admitting: Emergency Medicine

## 2017-06-13 ENCOUNTER — Emergency Department (HOSPITAL_COMMUNITY): Payer: Medicare HMO

## 2017-06-13 DIAGNOSIS — N182 Chronic kidney disease, stage 2 (mild): Secondary | ICD-10-CM | POA: Diagnosis not present

## 2017-06-13 DIAGNOSIS — Z87891 Personal history of nicotine dependence: Secondary | ICD-10-CM | POA: Diagnosis not present

## 2017-06-13 DIAGNOSIS — Z7982 Long term (current) use of aspirin: Secondary | ICD-10-CM | POA: Insufficient documentation

## 2017-06-13 DIAGNOSIS — I129 Hypertensive chronic kidney disease with stage 1 through stage 4 chronic kidney disease, or unspecified chronic kidney disease: Secondary | ICD-10-CM | POA: Diagnosis not present

## 2017-06-13 DIAGNOSIS — R0789 Other chest pain: Secondary | ICD-10-CM | POA: Diagnosis not present

## 2017-06-13 DIAGNOSIS — Z7902 Long term (current) use of antithrombotics/antiplatelets: Secondary | ICD-10-CM | POA: Diagnosis not present

## 2017-06-13 DIAGNOSIS — Z79899 Other long term (current) drug therapy: Secondary | ICD-10-CM | POA: Insufficient documentation

## 2017-06-13 DIAGNOSIS — Z96652 Presence of left artificial knee joint: Secondary | ICD-10-CM | POA: Diagnosis not present

## 2017-06-13 DIAGNOSIS — E1140 Type 2 diabetes mellitus with diabetic neuropathy, unspecified: Secondary | ICD-10-CM | POA: Insufficient documentation

## 2017-06-13 DIAGNOSIS — E1122 Type 2 diabetes mellitus with diabetic chronic kidney disease: Secondary | ICD-10-CM | POA: Insufficient documentation

## 2017-06-13 DIAGNOSIS — Z7984 Long term (current) use of oral hypoglycemic drugs: Secondary | ICD-10-CM | POA: Diagnosis not present

## 2017-06-13 DIAGNOSIS — R079 Chest pain, unspecified: Secondary | ICD-10-CM | POA: Diagnosis not present

## 2017-06-13 LAB — I-STAT TROPONIN, ED: TROPONIN I, POC: 0 ng/mL (ref 0.00–0.08)

## 2017-06-13 LAB — CBC
HCT: 47.4 % — ABNORMAL HIGH (ref 36.0–46.0)
Hemoglobin: 15.6 g/dL — ABNORMAL HIGH (ref 12.0–15.0)
MCH: 29.9 pg (ref 26.0–34.0)
MCHC: 32.9 g/dL (ref 30.0–36.0)
MCV: 91 fL (ref 78.0–100.0)
PLATELETS: 292 10*3/uL (ref 150–400)
RBC: 5.21 MIL/uL — ABNORMAL HIGH (ref 3.87–5.11)
RDW: 13.5 % (ref 11.5–15.5)
WBC: 11.3 10*3/uL — ABNORMAL HIGH (ref 4.0–10.5)

## 2017-06-13 LAB — BASIC METABOLIC PANEL
Anion gap: 12 (ref 5–15)
BUN: 8 mg/dL (ref 6–20)
CALCIUM: 9.8 mg/dL (ref 8.9–10.3)
CHLORIDE: 103 mmol/L (ref 101–111)
CO2: 24 mmol/L (ref 22–32)
CREATININE: 1.11 mg/dL — AB (ref 0.44–1.00)
GFR calc Af Amer: >60 mL/min (ref >60)
GFR calc non Af Amer: 54 mL/min — ABNORMAL LOW (ref >60)
GLUCOSE: 121 mg/dL — AB (ref 65–99)
Potassium: 3.9 mmol/L (ref 3.5–5.1)
Sodium: 139 mmol/L (ref 135–145)

## 2017-06-13 LAB — I-STAT BETA HCG BLOOD, ED (MC, WL, AP ONLY): I-stat hCG, quantitative: <5 m[IU]/mL (ref <5)
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
## 2017-06-13 NOTE — ED Notes (Signed)
Patient was given a heat pack to place on her chest.

## 2017-06-14 ENCOUNTER — Encounter (HOSPITAL_COMMUNITY): Payer: Self-pay | Admitting: Emergency Medicine

## 2017-06-14 LAB — I-STAT TROPONIN, ED: Troponin i, poc: 0 ng/mL (ref 0.00–0.08)
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
## 2017-06-15 ENCOUNTER — Ambulatory Visit: Payer: Medicare HMO | Admitting: Psychiatry

## 2017-06-15 ENCOUNTER — Telehealth: Payer: Self-pay

## 2017-06-15 ENCOUNTER — Encounter: Payer: Self-pay | Admitting: Psychiatry

## 2017-06-15 VITALS — BP 145/93 | HR 111 | Temp 97.7°F | Wt 233.8 lb

## 2017-06-15 DIAGNOSIS — G2119 Other drug induced secondary parkinsonism: Secondary | ICD-10-CM

## 2017-06-15 DIAGNOSIS — F4310 Post-traumatic stress disorder, unspecified: Secondary | ICD-10-CM | POA: Diagnosis not present

## 2017-06-15 DIAGNOSIS — F251 Schizoaffective disorder, depressive type: Secondary | ICD-10-CM | POA: Diagnosis not present

## 2017-06-15 MED ORDER — MIRTAZAPINE 15 MG PO TABS: 7.5000 mg | ORAL_TABLET | Freq: Every day | ORAL | 2 refills | Status: DC

## 2017-06-15 MED ORDER — PROPRANOLOL HCL 10 MG PO TABS: 10.0000 mg | ORAL_TABLET | Freq: Two times a day (BID) | ORAL | 1 refills | Status: DC

## 2017-06-15 MED ORDER — ARIPIPRAZOLE ER 400 MG IM PRSY: 400.0000 mg | PREFILLED_SYRINGE | INTRAMUSCULAR | 10 refills | Status: DC

## 2017-06-15 NOTE — Telephone Encounter (Signed)
pt states she can not afford insurance will not cover abilify maintena. needs something else.

## 2017-06-15 NOTE — Telephone Encounter (Signed)
Can we please try patient assistance
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
## 2017-06-15 NOTE — Patient Instructions (Signed)
Aripiprazole intramuscular injection What is this medicine? ARIPIPRAZOLE (ay ri PIP ray zole) is an atypical antipsychotic. It is used to treat agitation associated with schizophrenia or bipolar disorder, also known as manic-depression. This medicine may be used for other purposes; ask your health care provider or pharmacist if you have questions. COMMON BRAND NAME(S): Abilify What should I tell my health care provider before I take this medicine? They need to know if you have any of these conditions: -dehydration -dementia -diabetes -heart disease -history of stroke -low blood counts, like low white cell, platelet, or red cell counts -Parkinson's disease -seizures -suicidal thoughts, plans, or attempt; a previous suicide attempt by you or a family member -an unusual or allergic reaction to aripiprazole, other medicines, foods, dyes, or preservatives -pregnant or trying to get pregnant -breast-feeding How should I use this medicine? This medicine is for injection into a muscle. It is given by a health care professional in a hospital or clinic setting. A special MedGuide will be given to you before each treatment. Be sure to read this information carefully each time. Talk to your pediatrician regarding the use of this medicine in children. Special care may be needed. Overdosage: If you think you have taken too much of this medicine contact a poison control center or emergency room at once. NOTE: This medicine is only for you. Do not share this medicine with others. What if I miss a dose? This does not apply. What may interact with this medicine? Do not take this medicine with any of the following medications: -brexpiprazole -cisapride -dofetilide -dronedarone -metoclopramide -pimozide -thioridazine This medicine may also interact with the following medications: -alcohol -carbamazepine -certain medicines for anxiety or sleep -certain medicines for blood pressure -certain  medicines for fungal infections like ketoconazole, fluconazole, posaconazole, and itraconazole -clarithromycin -fluoxetine -other medicines that prolong the QT interval (cause an abnormal heart rhythm) -paroxetine -quinidine -rifampin This list may not describe all possible interactions. Give your health care provider a list of all the medicines, herbs, non-prescription drugs, or dietary supplements you use. Also tell them if you smoke, drink alcohol, or use illegal drugs. Some items may interact with your medicine. What should I watch for while using this medicine? Your condition will be monitored carefully while you are receiving this medicine. Tell your doctor or healthcare professional if your symptoms do not start to get better or if they get worse. You may get dizzy or drowsy. Do not drive, use machinery, or do anything that needs mental alertness until you know how this medicine affects you. Do not stand or sit up quickly, especially if you are an older patient. This reduces the risk of dizzy or fainting spells. Alcohol can increase dizziness and drowsiness. Avoid alcoholic drinks. This medicine can reduce the response of your body to heat or cold. Dress warm in cold weather and stay hydrated in hot weather. If possible, avoid extreme temperatures like saunas, hot tubs, very hot or cold showers, or activities that can cause dehydration such as vigorous exercise. This medicine may cause dry eyes and blurred vision. If you wear contact lenses you may feel some discomfort. Lubricating drops may help. See your eye doctor if the problem does not go away or is severe. If you notice an increased hunger or thirst, different from your normal hunger or thirst, or if you find that you have to urinate more frequently, you should contact your health care provider as soon as possible. You may need to have your blood sugar monitored.   This medicine may cause changes in your blood sugar levels. You should monitor  your blood sugar frequently if you have diabetes. There have been reports of uncontrollable and strong urges to gamble, binge eat, shop, and have sex while taking this medicine. If you experience any of these or other uncontrollable and strong urges while taking this medicine, you should report it to your health care provider as soon as possible. What side effects may I notice from receiving this medicine? Side effects that you should report to your doctor or health care professional as soon as possible: -allergic reactions like skin rash, itching or hives, swelling of the face, lips, or tongue -breathing problems -confusion -feeling faint or lightheaded, falls -fever or chills, sore throat -increased hunger or thirst -increased urination -joint pain -muscles pain, spasms -pain, redness, or irritation at site where injected -problems with balance, talking, walking -restlessness or need to keep moving -seizures -suicidal thoughts or other mood changes -trouble swallowing -uncontrollable and excessive urges (examples: gambling, binge eating, shopping, having sex) -uncontrollable head, mouth, neck, arm, or leg movements -unusually weak or tired Side effects that usually do not require medical attention (report to your doctor or health care professional if they continue or are bothersome): -blurred vision -constipation -headache -nausea, vomiting -trouble sleeping -weight gain This list may not describe all possible side effects. Call your doctor for medical advice about side effects. You may report side effects to FDA at 1-800-FDA-1088. Where should I keep my medicine? This does not apply. You will not be given this medicine to store at home. NOTE: This sheet is a summary. It may not cover all possible information. If you have questions about this medicine, talk to your doctor, pharmacist, or health care provider.  2018 Elsevier/Gold Standard (2016-01-11 11:41:34)  

## 2017-06-16 ENCOUNTER — Encounter: Payer: Self-pay | Admitting: Psychiatry

## 2017-06-22 DIAGNOSIS — E538 Deficiency of other specified B group vitamins: Secondary | ICD-10-CM | POA: Diagnosis not present

## 2017-06-24 DIAGNOSIS — R413 Other amnesia: Secondary | ICD-10-CM | POA: Diagnosis not present

## 2017-06-24 DIAGNOSIS — G43719 Chronic migraine without aura, intractable, without status migrainosus: Secondary | ICD-10-CM | POA: Diagnosis not present

## 2017-06-24 DIAGNOSIS — G2119 Other drug induced secondary parkinsonism: Secondary | ICD-10-CM | POA: Diagnosis not present

## 2017-06-29 ENCOUNTER — Encounter: Payer: Self-pay | Admitting: Psychiatry

## 2017-06-29 ENCOUNTER — Ambulatory Visit: Payer: Medicare HMO | Admitting: Psychiatry

## 2017-06-29 ENCOUNTER — Other Ambulatory Visit: Payer: Self-pay

## 2017-06-29 VITALS — BP 113/77 | HR 88 | Temp 97.8°F | Wt 246.0 lb

## 2017-06-29 DIAGNOSIS — F4310 Post-traumatic stress disorder, unspecified: Secondary | ICD-10-CM | POA: Diagnosis not present

## 2017-06-29 DIAGNOSIS — R413 Other amnesia: Secondary | ICD-10-CM | POA: Diagnosis not present

## 2017-06-29 DIAGNOSIS — F251 Schizoaffective disorder, depressive type: Secondary | ICD-10-CM | POA: Diagnosis not present

## 2017-06-29 DIAGNOSIS — G2119 Other drug induced secondary parkinsonism: Secondary | ICD-10-CM | POA: Diagnosis not present

## 2017-06-29 MED ORDER — ARIPIPRAZOLE 10 MG PO TABS: 15.0000 mg | ORAL_TABLET | Freq: Every day | ORAL | 1 refills | Status: DC

## 2017-06-29 MED ORDER — MIRTAZAPINE 7.5 MG PO TABS: 7.5000 mg | ORAL_TABLET | Freq: Every day | ORAL | 1 refills | Status: DC

## 2017-06-29 NOTE — Progress Notes (Signed)
[facility] MD OP Progress Note  06/29/2017 1:55 PM Heather Haley  MRN:  [id number]  Chief Complaint: ' I am here for follow up." Chief Complaint    Follow-up; Medication Refill     HPI: Heather Haley is a 57 year old Caucasian female, divorced, lives in [place], has a history of depression, psychosis, drug-induced Parkinson's disease, memory problem, history of trauma, migraine headaches, diabetes mellitus, presented to the clinic today for a follow-up visit.  Patient today reports she is doing fair.  She continues to have on and off hallucinations of clicks.  She was unable to get the Abilify Maintena injection as ordered last visit due to insurance problems.  Patient reports sleep is good.  Patient continues to have bilateral tremors of her hands.  She reports she is taking the propranolol and is tolerating it well.  She denies any side effects like dizziness.  Provided educated  about compliance with her medications.  Patient agrees with plan. Visit Diagnosis:    ICD-10-CM   1. Schizoaffective disorder, depressive type ([place]) F25.1   2. PTSD (post-traumatic stress disorder) F43.10 ARIPiprazole (ABILIFY) 10 MG tablet   improving  3. Drug-induced Parkinson's disease ([place]) G21.19   4. Memory problem R41.3     Past Psychiatric History: Reviewed past psychiatric history from my progress note on 05/25/2017.  Trials of Haldol, Prolixin, Prolixin Decanoate, Cymbalta, Pamelor, Wellbutrin, Topamax, gabapentin, hydroxyzine, Benadryl, Cogentin, Artane.  Past Medical History:  Past Medical History:  Diagnosis Date  . Anxiety   . Chronic kidney disease   . Chronic pain    previously saw Dr. Consuela Mimes in pain clinic, then saw pain specialist in [place]  . Depression   . Diabetes mellitus ([place])   . Frequency of urination   . GERD (gastroesophageal reflux disease)   . Headache(784.0)   . High cholesterol   . Hypertension   . IBS (irritable bowel syndrome)   . Left ankle instability   . Left  knee DJD   . Lumbar Degenerative Disc Disease of  10/11/2014  . Neuromuscular disorder ([place])   . Osteoarthritis of hip (Right) 05/05/2015  . Other enthesopathy of ankle and tarsus 12/15/2009   Qualifier: Diagnosis of  By: Oneida Alar MD, KARL    . Peripheral sensory neuropathy (Bilateral) 11/19/2014  . Postoperative nausea and vomiting   . Schizophrenia [facility])     Past Surgical History:  Procedure Laterality Date  . ABDOMINAL HYSTERECTOMY    . ANKLE SURGERY    . APPENDECTOMY    . COLONOSCOPY  2013  . COLONOSCOPY WITH PROPOFOL N/A 04/25/2017   Procedure: COLONOSCOPY WITH PROPOFOL;  Surgeon: Manya Silvas, MD;  Location: [facility] ENDOSCOPY;  Service: Endoscopy;  Laterality: N/A;  . ESOPHAGOGASTRODUODENOSCOPY (EGD) WITH PROPOFOL N/A 10/03/2014   Procedure: ESOPHAGOGASTRODUODENOSCOPY (EGD) WITH PROPOFOL;  Surgeon: Josefine Class, MD;  Location: [facility] ENDOSCOPY;  Service: Endoscopy;  Laterality: N/A;  . ESOPHAGOGASTRODUODENOSCOPY (EGD) WITH PROPOFOL  04/25/2017   Procedure: ESOPHAGOGASTRODUODENOSCOPY (EGD) WITH PROPOFOL;  Surgeon: Manya Silvas, MD;  Location: [facility] ENDOSCOPY;  Service: Endoscopy;;  . KNEE ARTHROSCOPY  1997   left knee  . LEFT HEART CATHETERIZATION WITH CORONARY ANGIOGRAM N/A 01/11/2013   Procedure: LEFT HEART CATHETERIZATION WITH CORONARY ANGIOGRAM;  Surgeon: Sinclair Grooms, MD;  Location: [facility] CATH LAB;  Service: Cardiovascular;  Laterality: N/A;  . TOTAL KNEE ARTHROPLASTY  08/30/2011   Procedure: TOTAL KNEE ARTHROPLASTY;  Surgeon: Lorn Junes, MD;  Location: [place] [facility];  Service: Orthopedics;  Laterality: Left;    Family  Psychiatric History: Reviewed family psychiatric history from my progress note on 05/25/2017.  Family History:  Family History  Problem Relation Age of Onset  . Cancer Mother   . Hypertension Father   . Heart disease Father   . Alcohol abuse Father    Substance abuse history: Denies  Social History: I have reviewed social history from my progress note  on 05/25/2017. Social History   Socioeconomic History  . Marital status: Divorced    Spouse name: Not on file  . Number of children: 2  . Years of education: 67  . Highest education level: 11th grade  Occupational History    Employer: [employer]  . Financial resource strain: Somewhat hard  . Food insecurity:    Worry: Never true    Inability: Never true  . Transportation needs:    Medical: No    Non-medical: No  Tobacco Use  . Smoking status: Former Smoker    Packs/day: 2.00    Years: 15.00    Pack years: 30.00    Types: Cigarettes    Last attempt to quit: 02/09/1995    Years since quitting: 22.4  . Smokeless tobacco: Never Used  Substance and Sexual Activity  . Alcohol use: No    Alcohol/week: 0.0 oz  . Drug use: No  . Sexual activity: Not Currently  Lifestyle  . Physical activity:    Days per week: 0 days    Minutes per session: 0 min  . Stress: Rather much  Relationships  . Social connections:    Talks on phone: More than three times a week    Gets together: Once a week    Attends religious service: Never    Active member of club or organization: No    Attends meetings of clubs or organizations: Never    Relationship status: Divorced  Other Topics Concern  . Not on file  Social History Narrative   Patient lives at home alone. Patient works at [employer].   Caffeine daily- 2   Right handed.   Education- 11 th grade    Allergies:  Allergies  Allergen Reactions  . Benztropine Other (See Comments)    Hair fall out  Suicide thoughts  . Buprenorphine Hcl Other (See Comments)    Unable to void  . Codeine Hives    REACTION: hives  . Cymbalta [Duloxetine Hcl] Other (See Comments)    Altered mental status  . Duloxetine Other (See Comments)    Altered mental status Alopecia, visual hallucinations, nightmares  . Gabapentin Swelling  . Lyrica [Pregabalin] Other (See Comments)    Alopecia, visual hallucinations, nightmares Altered mental  status Alopecia, visual hallucinations, nightmares Altered mental status  . Morphine And Related Other (See Comments)    Unable to void  . Nortripytline Hcl [Nortriptyline] Other (See Comments)    Hair loss and night mares Hair loss and night mares  . Nsaids     REACTION: palpitations, diaphoresis  . Penicillin G Nausea And Vomiting  . Penicillins     REACTION: upset stomach  . Tolmetin Other (See Comments)    REACTION: palpitations, diaphoresis  . Wellbutrin [Bupropion]     Constipation, mood swings    Metabolic Disorder Labs: Lab Results  Component Value Date   HGBA1C 6.2 (H) 03/06/2017   MPG 131.24 03/06/2017   MPG 108 05/17/2015   Lab Results  Component Value Date   PROLACTIN 64.2 (H) 03/06/2017   Lab Results  Component Value Date  CHOL 175 03/06/2017   TRIG 234 (H) 03/06/2017   HDL 69 03/06/2017   CHOLHDL 2.5 03/06/2017   VLDL 47 (H) 03/06/2017   LDLCALC 59 03/06/2017   LDLCALC 93 05/17/2015   Lab Results  Component Value Date   TSH 1.289 03/06/2017   TSH 2.252 10/13/2012    Therapeutic Level Labs: No results found for: LITHIUM No results found for: VALPROATE No components found for:  CBMZ  Current Medications: Current Outpatient Medications  Medication Sig Dispense Refill  . acetaminophen (TYLENOL) 500 MG tablet Take 1 tablet (500 mg total) by mouth every 6 (six) hours as needed for mild pain. 30 tablet 0  . ARIPiprazole (ABILIFY) 10 MG tablet Take 1.5 tablets (15 mg total) by mouth at bedtime. 135 tablet 1  . ASPIRIN PO Take 81 mg by mouth daily.     . cetirizine (ZYRTEC) 10 MG tablet Take 10 mg by mouth daily.    . Cholecalciferol (VITAMIN D PO) Take 2 tablets by mouth daily.     . clindamycin (CLEOCIN) 300 MG capsule Take 300 mg by mouth 3 (three) times daily.    . cyanocobalamin (,VITAMIN B-12,) 1000 MCG/ML injection Inject into the muscle.    . cyclobenzaprine (FLEXERIL) 10 MG tablet Take by mouth.    . docusate sodium (COLACE) 100 MG capsule  Take 100 mg by mouth at bedtime.     . furosemide (LASIX) 20 MG tablet Take 20 mg by mouth.    Marland Kitchen HYDROcodone-acetaminophen (NORCO/VICODIN) 5-325 MG tablet Take 1 tablet by mouth every 6 (six) hours as needed for moderate pain.    . metFORMIN (GLUCOPHAGE) 500 MG tablet Take 1 tablet (500 mg total) by mouth 2 (two) times daily with a meal. For diabetes management (Patient taking differently: Take 500 mg by mouth as needed. If her is sugar is below 115 she does not take it) 10 tablet 0  . mirtazapine (REMERON) 7.5 MG tablet Take 1 tablet (7.5 mg total) by mouth at bedtime. 45 tablet 1  . OMEPRAZOLE PO Take by mouth.    . ondansetron (ZOFRAN) 4 MG tablet Take 4 mg by mouth every 8 (eight) hours as needed for nausea or vomiting.    . pravastatin (PRAVACHOL) 40 MG tablet Take 40 mg by mouth at bedtime.     . propranolol (INDERAL) 10 MG tablet Take 1 tablet (10 mg total) by mouth 2 (two) times daily. tremors 60 tablet 1  . ranitidine (ZANTAC) 150 MG tablet Take 150 mg by mouth daily.     . rizatriptan (MAXALT) 10 MG tablet Take 10 mg by mouth as needed for migraine. May repeat in 2 hours if needed    . senna (SENOKOT) 8.6 MG tablet Take 1-4 tablets (8.6-34.4 mg total) by mouth daily as needed for constipation.    . topiramate (TOPAMAX) 100 MG tablet Take 0.5 tablets (50 mg total) by mouth at bedtime. For mood control (Patient taking differently: Take 100 mg by mouth at bedtime. For mood control) 30 tablet 1  . traMADol (ULTRAM) 50 MG tablet Take 2 tablets (100 mg total) by mouth every 8 (eight) hours as needed for moderate pain or severe pain. 180 tablet 2  . verapamil (CALAN-SR) 120 MG CR tablet Take 1 tablet (120 mg total) by mouth at bedtime. For high blood pressure 90 tablet 1   No current facility-administered medications for this visit.      Musculoskeletal: Strength & Muscle Tone: within normal limits Gait & Station: normal Patient  leans: N/A  Psychiatric Specialty Exam: Review of Systems   Psychiatric/Behavioral: The patient is nervous/anxious.   All other systems reviewed and are negative.   Blood pressure 113/77, pulse 88, temperature 97.8 F (36.6 C), temperature source Oral, weight 246 lb (111.6 kg).Body mass index is 40.94 kg/m.  General Appearance: Casual  Eye Contact:  Fair  Speech:  Normal Rate  Volume:  Normal  Mood:  Dysphoric  Affect:  Appropriate  Thought Process:  Goal Directed and Descriptions of Associations: Intact  Orientation:  Full (Time, Place, and Person)  Thought Content: Hallucinations: Auditory on and off clicks   Suicidal Thoughts:  No  Homicidal Thoughts:  No  Memory:  Immediate;   Fair Recent;   Fair Remote;   Fair  Judgement:  Fair  Insight:  Fair  Psychomotor Activity:  Tremor  Concentration:  Concentration: Fair and Attention Span: Fair  Recall:  [employer] of Knowledge: Fair  Language: Fair  Akathisia:  No  Handed:  Right  AIMS (if indicated): 6  Assets:  Communication Skills Desire for Improvement Social Support  ADL's:  Intact  Cognition: WNL  Sleep:  Fair   Screenings: AIMS     Office Visit from 06/29/2017 in [place] Office Visit from 03/24/2017 in [place] Admission (Discharged) from 03/03/2017 in [place] [facility]  AIMS Total Score  6  10  0    AUDIT     Admission (Discharged) from 03/03/2017 in [place] [facility]  Alcohol Use Disorder Identification Test Final Score (AUDIT)  0    GAD-7     Office Visit from 08/22/2015 in [place] at [facility]  Total GAD-7 Score  6    PHQ2-9     Office Visit from 03/23/2016 in [place] Office Visit from 08/22/2015 in [place] at [place] from 08/06/2015 in [place] Office Visit from 06/10/2015 in [place]  Clinical Support from 05/19/2015 in [place]  PHQ-2 Total Score  0  1  0  0  0       Assessment and Plan: Heather Haley is a 57 year old Caucasian female, history of depression, psychosis, PTSD, migraine headaches, diabetes mellitus, drug-induced parkinsonism, memory problems, presented to the clinic today for a follow-up visit.  Patient reports she is doing fair on the medications.  However there is a possibility of noncompliance with medications on and off.  Abilify Maintena was not affordable for patient.  We will try to get patient assistance program for patient.  Patient also struggles with memory problems.  Patient is on polypharmacy as well as medications like Topamax which may also be contributing to it.  This was discussed with patient previously.  Discussed plan as noted below.  Plan Schizoaffective disorder Continue Abilify 15 mg p.o. daily Patient unable to afford Abilify Maintena which was ordered last visit.  We will try to get her patient assistance program.  For anxiety symptoms/PTSD Added Remeron 7.5 mg p.o. nightly.  For drug-induced Parkinson's symptoms Aims equals 6 Patient is currently on propanolol 10 mg p.o. twice daily. Artane was discontinued last visit due to side effect concerns.  Memory problems She will continue to work with her neurologist. MMSE done on 05/06/2017-24 out of 30  For insomnia Remeron 7.5 mg p.o. nightly  Follow-up in clinic in 3-4 weeks or sooner if  needed.  More than 50 % of the time was spent for psychoeducation and supportive psychotherapy and care coordination.  This note was generated in part or whole with voice recognition software. Voice recognition is usually quite accurate but there are transcription errors that can and very often do occur. I apologize for any typographical errors that were not detected and corrected.         Ursula Alert, MD 06/29/2017, 1:55 PM

## 2017-07-05 ENCOUNTER — Telehealth: Payer: Self-pay

## 2017-07-05 DIAGNOSIS — F4310 Post-traumatic stress disorder, unspecified: Secondary | ICD-10-CM

## 2017-07-05 MED ORDER — ARIPIPRAZOLE 10 MG PO TABS: 15.0000 mg | ORAL_TABLET | Freq: Every day | ORAL | 1 refills | Status: DC

## 2017-07-05 NOTE — Telephone Encounter (Signed)
received notices of prior authorization.  the prior authoriation was dendied. per the pharmacy can you try a new rx for just a 30 day supply instead of 90 day supply.

## 2017-07-05 NOTE — Telephone Encounter (Signed)
30 days supply for abilify PO sent to pharmacy per request

## 2017-07-06 ENCOUNTER — Telehealth: Payer: Self-pay

## 2017-07-06 NOTE — Telephone Encounter (Signed)
pt called states she can not take the propranolol it is dropping her blood pressure too low and she not felling good.

## 2017-07-06 NOTE — Telephone Encounter (Signed)
Ok , please ask her to stop it and we can discuss other options when she comes back.

## 2017-07-08 DIAGNOSIS — J9801 Acute bronchospasm: Secondary | ICD-10-CM | POA: Diagnosis not present

## 2017-07-08 DIAGNOSIS — Z87891 Personal history of nicotine dependence: Secondary | ICD-10-CM | POA: Diagnosis not present

## 2017-07-08 DIAGNOSIS — B349 Viral infection, unspecified: Secondary | ICD-10-CM | POA: Diagnosis not present

## 2017-07-19 DIAGNOSIS — K5909 Other constipation: Secondary | ICD-10-CM | POA: Diagnosis not present

## 2017-07-19 DIAGNOSIS — R1310 Dysphagia, unspecified: Secondary | ICD-10-CM | POA: Diagnosis not present

## 2017-07-19 DIAGNOSIS — K210 Gastro-esophageal reflux disease with esophagitis: Secondary | ICD-10-CM | POA: Diagnosis not present

## 2017-07-19 DIAGNOSIS — R1013 Epigastric pain: Secondary | ICD-10-CM | POA: Diagnosis not present

## 2017-07-21 ENCOUNTER — Other Ambulatory Visit: Payer: Self-pay | Admitting: Student

## 2017-07-21 DIAGNOSIS — R1310 Dysphagia, unspecified: Secondary | ICD-10-CM

## 2017-07-21 DIAGNOSIS — R1013 Epigastric pain: Secondary | ICD-10-CM

## 2017-07-27 ENCOUNTER — Ambulatory Visit
Admission: RE | Admit: 2017-07-27 | Discharge: 2017-07-27 | Disposition: A | Discharge status: Home / Self Care | Payer: Medicare HMO | Source: Ambulatory Visit | Attending: Student | Admitting: Student

## 2017-07-27 ENCOUNTER — Ambulatory Visit: Payer: Self-pay

## 2017-07-27 DIAGNOSIS — K449 Diaphragmatic hernia without obstruction or gangrene: Secondary | ICD-10-CM | POA: Diagnosis not present

## 2017-07-27 DIAGNOSIS — K228 Other specified diseases of esophagus: Secondary | ICD-10-CM | POA: Diagnosis not present

## 2017-07-27 DIAGNOSIS — E7800 Pure hypercholesterolemia, unspecified: Secondary | ICD-10-CM | POA: Diagnosis not present

## 2017-07-27 DIAGNOSIS — E538 Deficiency of other specified B group vitamins: Secondary | ICD-10-CM | POA: Diagnosis not present

## 2017-07-27 DIAGNOSIS — I10 Essential (primary) hypertension: Secondary | ICD-10-CM | POA: Diagnosis not present

## 2017-07-27 DIAGNOSIS — R1310 Dysphagia, unspecified: Secondary | ICD-10-CM | POA: Diagnosis not present

## 2017-07-27 DIAGNOSIS — R1013 Epigastric pain: Secondary | ICD-10-CM | POA: Diagnosis not present

## 2017-07-27 DIAGNOSIS — E119 Type 2 diabetes mellitus without complications: Secondary | ICD-10-CM | POA: Diagnosis not present

## 2017-07-29 ENCOUNTER — Ambulatory Visit: Payer: Medicare HMO | Admitting: Psychiatry

## 2017-07-29 ENCOUNTER — Encounter: Payer: Self-pay | Admitting: Psychiatry

## 2017-07-29 VITALS — BP 119/85 | HR 108 | Ht 65.0 in | Wt 251.4 lb

## 2017-07-29 DIAGNOSIS — G2119 Other drug induced secondary parkinsonism: Secondary | ICD-10-CM

## 2017-07-29 DIAGNOSIS — F251 Schizoaffective disorder, depressive type: Secondary | ICD-10-CM | POA: Diagnosis not present

## 2017-07-29 DIAGNOSIS — F4310 Post-traumatic stress disorder, unspecified: Secondary | ICD-10-CM

## 2017-07-29 MED ORDER — LURASIDONE HCL 20 MG PO TABS: 20.0000 mg | ORAL_TABLET | Freq: Every day | ORAL | 0 refills | Status: DC

## 2017-07-29 NOTE — Progress Notes (Signed)
[place] MD OP Progress Note  07/29/2017 1:09 PM Heather Haley  MRN:  [id number]  Chief Complaint: ' I am here for follow up." Chief Complaint    Follow-up     HPI: Heather Haley is a 57 year old Caucasian female, divorced, lives in [place] , is a history of depression, psychosis, drug-induced Parkinson's disease, memory problem, history of trauma, migraine headaches, diabetes mellitus, presented to the clinic today for a follow-up visit.  Patient today reports she is doing okay mood wise.  She denies any hallucinations.  She reports sleep is fair.  She denies any mood lability.  She denies any anxiety symptoms.  She denies any suicidality.  She however reports she is concerned about weight gain issues.  She reports she has gained a lot of weight since the past few months.  She reports she is worried about being on Abilify since its causing her weight changes.  Discussed other medications.  She has never been tried on [place].  Patient reports she would like to try it.  Patient otherwise denies any concerns.  She reports she had some GI issues which are currently being managed by her primary providers.  She reports she stopped taking the propranolol since it made her dizzy.  She continues to have some tremors of her upper extremities.  She however reports she is not distressed. Visit Diagnosis:    ICD-10-CM   1. Schizoaffective disorder, depressive type ([place]) F25.1   2. PTSD (post-traumatic stress disorder) F43.10   3. Drug-induced Parkinson's disease ([place]) G21.19     Past Psychiatric History: Reviewed past psychiatric history from my progress note on 05/25/2017  Past Medical History:  Past Medical History:  Diagnosis Date  . Anxiety   . Chronic kidney disease   . Chronic pain    previously saw Dr. Consuela Mimes in pain clinic, then saw pain specialist in [place]  . Depression   . Diabetes mellitus ([place])   . Frequency of urination   . GERD (gastroesophageal reflux disease)   .  Headache(784.0)   . High cholesterol   . Hypertension   . IBS (irritable bowel syndrome)   . Left ankle instability   . Left knee DJD   . Lumbar Degenerative Disc Disease of  10/11/2014  . Neuromuscular disorder ([place])   . Osteoarthritis of hip (Right) 05/05/2015  . Other enthesopathy of ankle and tarsus 12/15/2009   Qualifier: Diagnosis of  By: Oneida Alar MD, KARL    . Peripheral sensory neuropathy (Bilateral) 11/19/2014  . Postoperative nausea and vomiting   . Schizophrenia [facility])     Past Surgical History:  Procedure Laterality Date  . ABDOMINAL HYSTERECTOMY    . ANKLE SURGERY    . APPENDECTOMY    . COLONOSCOPY  2013  . COLONOSCOPY WITH PROPOFOL N/A 04/25/2017   Procedure: COLONOSCOPY WITH PROPOFOL;  Surgeon: Manya Silvas, MD;  Location: [facility] ENDOSCOPY;  Service: Endoscopy;  Laterality: N/A;  . ESOPHAGOGASTRODUODENOSCOPY (EGD) WITH PROPOFOL N/A 10/03/2014   Procedure: ESOPHAGOGASTRODUODENOSCOPY (EGD) WITH PROPOFOL;  Surgeon: Josefine Class, MD;  Location[facility] ENDOSCOPY;  Service: Endoscopy;  Laterality: N/A;  . ESOPHAGOGASTRODUODENOSCOPY (EGD) WITH PROPOFOL  04/25/2017   Procedure: ESOPHAGOGASTRODUODENOSCOPY (EGD) WITH PROPOFOL;  Surgeon: Manya Silvas, MD;  Location: [facility] ENDOSCOPY;  Service: Endoscopy;;  . KNEE ARTHROSCOPY  1997   left knee  . LEFT HEART CATHETERIZATION WITH CORONARY ANGIOGRAM N/A 01/11/2013   Procedure: LEFT HEART CATHETERIZATION WITH CORONARY ANGIOGRAM;  Surgeon: Sinclair Grooms, MD;  Location: [facility] CATH LAB;  Service: Cardiovascular;  Laterality: N/A;  . TOTAL KNEE ARTHROPLASTY  08/30/2011   Procedure: TOTAL KNEE ARTHROPLASTY;  Surgeon: Lorn Junes, MD;  Location: [place];  Service: Orthopedics;  Laterality: Left;    Family Psychiatric History: Reviewed family psychiatric history from my progress note on 05/25/2017.  Family History:  Family History  Problem Relation Age of Onset  . Cancer Mother   . Hypertension Father   . Heart disease Father   .  Alcohol abuse Father     Social History: Reviewed social history from my progress note on 05/25/2017 Social History   Socioeconomic History  . Marital status: Divorced    Spouse name: Not on file  . Number of children: 2  . Years of education: 65  . Highest education level: 11th grade  Occupational History    Employer: [employer]  . Financial resource strain: Somewhat hard  . Food insecurity:    Worry: Never true    Inability: Never true  . Transportation needs:    Medical: No    Non-medical: No  Tobacco Use  . Smoking status: Former Smoker    Packs/day: 2.00    Years: 15.00    Pack years: 30.00    Types: Cigarettes    Last attempt to quit: 02/09/1995    Years since quitting: 22.4  . Smokeless tobacco: Never Used  Substance and Sexual Activity  . Alcohol use: No    Alcohol/week: 0.0 oz  . Drug use: No  . Sexual activity: Not Currently  Lifestyle  . Physical activity:    Days per week: 0 days    Minutes per session: 0 min  . Stress: Rather much  Relationships  . Social connections:    Talks on phone: More than three times a week    Gets together: Once a week    Attends religious service: Never    Active member of club or organization: No    Attends meetings of clubs or organizations: Never    Relationship status: Divorced  Other Topics Concern  . Not on file  Social History Narrative   Patient lives at home alone. Patient works at [employer].   Caffeine daily- 2   Right handed.   Education- 11 th grade    Allergies:  Allergies  Allergen Reactions  . Benztropine Other (See Comments)    Hair fall out  Suicide thoughts  . Buprenorphine Hcl Other (See Comments)    Unable to void  . Codeine Hives    REACTION: hives  . Cymbalta [Duloxetine Hcl] Other (See Comments)    Altered mental status  . Duloxetine Other (See Comments)    Altered mental status Alopecia, visual hallucinations, nightmares  . Gabapentin Swelling  . Lyrica  [Pregabalin] Other (See Comments)    Alopecia, visual hallucinations, nightmares Altered mental status Alopecia, visual hallucinations, nightmares Altered mental status  . Morphine And Related Other (See Comments)    Unable to void  . Nortripytline Hcl [Nortriptyline] Other (See Comments)    Hair loss and night mares Hair loss and night mares  . Nsaids     REACTION: palpitations, diaphoresis  . Penicillin G Nausea And Vomiting  . Penicillins     REACTION: upset stomach  . Tolmetin Other (See Comments)    REACTION: palpitations, diaphoresis  . Wellbutrin [Bupropion]     Constipation, mood swings    Metabolic Disorder Labs: Lab Results  Component Value Date   HGBA1C 6.2 (H) 03/06/2017  MPG 131.24 03/06/2017   MPG 108 05/17/2015   Lab Results  Component Value Date   PROLACTIN 64.2 (H) 03/06/2017   Lab Results  Component Value Date   CHOL 175 03/06/2017   TRIG 234 (H) 03/06/2017   HDL 69 03/06/2017   CHOLHDL 2.5 03/06/2017   VLDL 47 (H) 03/06/2017   LDLCALC 59 03/06/2017   LDLCALC 93 05/17/2015   Lab Results  Component Value Date   TSH 1.289 03/06/2017   TSH 2.252 10/13/2012    Therapeutic Level Labs: No results found for: LITHIUM No results found for: VALPROATE No components found for:  CBMZ  Current Medications: Current Outpatient Medications  Medication Sig Dispense Refill  . ASPIRIN PO Take 81 mg by mouth daily.     . cetirizine (ZYRTEC) 10 MG tablet Take 10 mg by mouth daily.    . Cholecalciferol (VITAMIN D PO) Take 2 tablets by mouth daily.     . clindamycin (CLEOCIN) 300 MG capsule Take 300 mg by mouth 3 (three) times daily.    . cyanocobalamin (,VITAMIN B-12,) 1000 MCG/ML injection Inject into the muscle.    . cyclobenzaprine (FLEXERIL) 10 MG tablet Take by mouth.    . docusate sodium (COLACE) 100 MG capsule Take 100 mg by mouth at bedtime.     . furosemide (LASIX) 20 MG tablet Take 20 mg by mouth.    . metFORMIN (GLUCOPHAGE) 500 MG tablet Take 1  tablet (500 mg total) by mouth 2 (two) times daily with a meal. For diabetes management (Patient taking differently: Take 500 mg by mouth as needed. If her is sugar is below 115 she does not take it) 10 tablet 0  . mirtazapine (REMERON) 7.5 MG tablet Take 1 tablet (7.5 mg total) by mouth at bedtime. 45 tablet 1  . OMEPRAZOLE PO Take by mouth.    . ondansetron (ZOFRAN) 4 MG tablet Take 4 mg by mouth every 8 (eight) hours as needed for nausea or vomiting.    . pravastatin (PRAVACHOL) 40 MG tablet Take 40 mg by mouth at bedtime.     . ranitidine (ZANTAC) 150 MG tablet Take 150 mg by mouth daily.     . rizatriptan (MAXALT) 10 MG tablet Take 10 mg by mouth as needed for migraine. May repeat in 2 hours if needed    . senna (SENOKOT) 8.6 MG tablet Take 1-4 tablets (8.6-34.4 mg total) by mouth daily as needed for constipation.    . topiramate (TOPAMAX) 100 MG tablet Take 0.5 tablets (50 mg total) by mouth at bedtime. For mood control (Patient taking differently: Take 100 mg by mouth at bedtime. For mood control) 30 tablet 1  . verapamil (CALAN-SR) 120 MG CR tablet Take 1 tablet (120 mg total) by mouth at bedtime. For high blood pressure 90 tablet 1  . acetaminophen (TYLENOL) 500 MG tablet Take 1 tablet (500 mg total) by mouth every 6 (six) hours as needed for mild pain. (Patient not taking: Reported on 07/29/2017) 30 tablet 0  . HYDROcodone-acetaminophen (NORCO/VICODIN) 5-325 MG tablet Take 1 tablet by mouth every 6 (six) hours as needed for moderate pain.    Marland Kitchen lurasidone (LATUDA) 20 MG TABS tablet Take 1 tablet (20 mg total) by mouth daily with supper. 10 tablet 0  . traMADol (ULTRAM) 50 MG tablet Take 2 tablets (100 mg total) by mouth every 8 (eight) hours as needed for moderate pain or severe pain. 180 tablet 2   No current facility-administered medications for this visit.  Musculoskeletal: Strength & Muscle Tone: within normal limits Gait & Station: normal Patient leans: N/A  Psychiatric  Specialty Exam: Review of Systems  Psychiatric/Behavioral: The patient is nervous/anxious.   All other systems reviewed and are negative.   Blood pressure 119/85, pulse (!) 108, height 5\' 5"  (1.651 m), weight 251 lb 6.4 oz (114 kg), SpO2 97 %.Body mass index is 41.84 kg/m.  General Appearance: Casual  Eye Contact:  Fair  Speech:  Normal Rate  Volume:  Normal  Mood:  Dysphoric  Affect:  Appropriate  Thought Process:  Goal Directed and Descriptions of Associations: Intact  Orientation:  Full (Time, Place, and Person)  Thought Content: Logical   Suicidal Thoughts:  No  Homicidal Thoughts:  No  Memory:  Immediate;   Fair Recent;   Fair Remote;   Fair  Judgement:  Fair  Insight:  Fair  Psychomotor Activity:  Tremor  Concentration:  Concentration: Fair and Attention Span: Fair  Recall:  [employer] of Knowledge: Fair  Language: Fair  Akathisia:  No  Handed:  Right  AIMS (if indicated): 6  Assets:  Communication Skills Desire for Improvement Social Support  ADL's:  Intact  Cognition: WNL  Sleep:  Fair   Screenings: AIMS     Office Visit from 06/29/2017 in [place] Office Visit from 03/24/2017 in [place] Admission (Discharged) from 03/03/2017 in [place] [facility]  AIMS Total Score  6  10  0    AUDIT     Admission (Discharged) from 03/03/2017 in [place] [facility]  Alcohol Use Disorder Identification Test Final Score (AUDIT)  0    GAD-7     Office Visit from 08/22/2015 in [place] at [facility]  Total GAD-7 Score  6    PHQ2-9     Office Visit from 03/23/2016 in [place] Office Visit from 08/22/2015 in [place] at [place] from 08/06/2015 in [place] Office Visit from 06/10/2015 in [place] Clinical Support  from 05/19/2015 in [place]  PHQ-2 Total Score  0  1  0  0  0       Assessment and Plan: Heather Haley is a 57 year old Caucasian female, history of depression, psychosis, PTSD, migraine headaches, diabetes mellitus, drug-induced Parkinsonian some, memory problems, presented to the clinic today for a follow-up visit.  Patient is concerned about side effects of Abilify.  She is very worried about weight changes.  Discussed changing her Abilify to [place].  Will make medication changes as noted below.  Plan Schizoaffective disorder Discontinue Abilify for side effect concerns. Start Latuda 20 mg p.o. daily with supper. Provided medication education, provided handouts.  Ascites symptoms/PTSD Remeron 7.5 mg p.o. nightly  For drug-induced Parkinson's symptoms Aims equals 6 Patient stopped taking propanolol. Add any medications today.  Memory problems She will continue to work with neurologist. MMSE - 29/30 -05/06/2017  Insomnia Remeron 7.5 mg p.o. nightly  Follow-up in clinic in 1 week.  More than 50 % of the time was spent for psychoeducation and supportive psychotherapy and care coordination.  This note was generated in part or whole with voice recognition software. Voice recognition is usually quite accurate but there are transcription errors that can and very often do occur. I apologize for any typographical errors that were not detected and corrected.  Ursula Alert, MD 07/29/2017, 1:09 PM

## 2017-07-29 NOTE — Patient Instructions (Signed)
Lurasidone oral tablet What is this medicine? LURASIDONE (loo RAS i done) is an antipsychotic. It is used to treat schizophrenia or bipolar disorder, also known as manic-depression. This medicine may be used for other purposes; ask your health care provider or pharmacist if you have questions. COMMON BRAND NAME(S): Latuda What should I tell my health care provider before I take this medicine? They need to know if you have any of these conditions: -dementia -diabetes -difficulty swallowing -heart disease -history of breast cancer -kidney disease -liver disease -low blood counts, like low white cell, platelet, or red cell counts -low blood pressure -Parkinson's disease -seizures -suicidal thoughts, plans, or attempt; a previous suicide attempt by you or a family member -an unusual or allergic reaction to lurasidone, other medicines, foods, dyes, or preservatives -pregnant or trying to get pregnant -breast-feeding How should I use this medicine? Take this medicine by mouth with a glass of water. Follow the directions on the prescription label. Take this medicine with food. Take your medicine at regular intervals. Do not take it more often than directed. Do not stop taking except on your doctor's advice. Talk to your pediatrician regarding the use of this medicine in children. While this drug may be prescribed for children as young as 13 years for selected conditions, precautions do apply. Overdosage: If you think you have taken too much of this medicine contact a poison control center or emergency room at once. NOTE: This medicine is only for you. Do not share this medicine with others. What if I miss a dose? If you miss a dose, take it as soon as you can. If it is almost time for your next dose, take only that dose. Do not take double or extra doses. What may interact with this medicine? Do not take this medicine with any of the following medications: -dalfopristin;  quinupristin -delavirdine -indinavir -isoniazid -itraconazole -ketoconazole -lumacaftor; ivacaftor -metoclopramide -rifampin -ritonavir -tipranavir This medicine may also interact with the following medications: -alcohol -certain medicines for anxiety or sleep -diltiazem -digoxin -lithium -medicines for blood pressure This list may not describe all possible interactions. Give your health care provider a list of all the medicines, herbs, non-prescription drugs, or dietary supplements you use. Also tell them if you smoke, drink alcohol, or use illegal drugs. Some items may interact with your medicine. What should I watch for while using this medicine? Visit your doctor or health care professional for regular checks on your progress. It may be several weeks before you see the full effects of this medicine. Notify your doctor or health care professional if your symptoms get worse, if you have new symptoms, if you are having an unusual effect from this medicine, or if you feel out of control, very discouraged or think you might harm yourself or others. Do not suddenly stop taking this medicine. You may need to gradually reduce the dose. Ask your doctor or health care professional for advice. You may get dizzy or drowsy. Do not drive, use machinery, or do anything that needs mental alertness until you know how this medicine affects you. Do not stand or sit up quickly, especially if you are an older patient. This reduces the risk of dizzy or fainting spells. Alcohol can increase dizziness and drowsiness. Avoid alcoholic drinks. This medicine may cause dry eyes and blurred vision. If you wear contact lenses you may feel some discomfort. Lubricating drops may help. See your eye doctor if the problem does not go away or is severe. This medicine can   reduce the response of your body to heat or cold. Dress warm in cold weather and stay hydrated in hot weather. If possible, avoid extreme temperatures like  saunas, hot tubs, very hot or cold showers, or activities that can cause dehydration such as vigorous exercise. What side effects may I notice from receiving this medicine? Side effects that you should report to your doctor or health care professional as soon as possible: -allergic reactions like skin rash, itching or hives, swelling of the face, lips, or tongue -confusion -feeling faint or lightheaded, falls -fever or chills, sore throat -high fever -inability to control muscle movements in the face, mouth, hands, arms, or legs -increased hunger or thirst -increased urination -missed menstrual periods -restlessness or need to keep moving -seizures -stiffness, spasms, trembling -suicidal thoughts or other mood changes -unusually weak or tired Side effects that usually do not require medical attention (report to your doctor or health care professional if they continue or are bothersome): -blurred vision -change in sex drive or performance -diarrhea -drowsiness -nausea, vomiting -weight gain This list may not describe all possible side effects. Call your doctor for medical advice about side effects. You may report side effects to FDA at 1-800-FDA-1088. Where should I keep my medicine? Keep out of the reach of children. Store at room temperature between 15 and 30 degrees C (59 and 86 degrees F). Throw away any unused medicine after the expiration date. NOTE: This sheet is a summary. It may not cover all possible information. If you have questions about this medicine, talk to your doctor, pharmacist, or health care provider.  2018 Elsevier/Gold Standard (2015-03-12 10:23:22)  

## 2017-08-03 ENCOUNTER — Telehealth: Payer: Self-pay

## 2017-08-03 NOTE — Telephone Encounter (Signed)
aprroval notice was received prior auth good until 07-30-2019 . pharmacy faxed and confirmed pa approva

## 2017-08-03 NOTE — Telephone Encounter (Signed)
went to [email or web address] and did a prior auth - pending

## 2017-08-03 NOTE — Telephone Encounter (Signed)
received a fax that a prior auth is needed for latuda 20mg 

## 2017-08-04 ENCOUNTER — Telehealth: Payer: Self-pay

## 2017-08-04 NOTE — Telephone Encounter (Signed)
Ok I see her tomorrow and can discuss further changes.

## 2017-08-04 NOTE — Telephone Encounter (Signed)
Tc to patient left a voice message to come to appt for tomorrow.

## 2017-08-04 NOTE — Telephone Encounter (Signed)
pt called states that she hasnt taken the medication because pharamcy said it needed a prior auth.  pt was told that a prior auth was already done and approved and it had been sent to pharmacy. pt was adivsed to contact pharmacy.  do you want pt to keep appt for tomorrow?

## 2017-08-04 NOTE — Telephone Encounter (Signed)
it was approved but pt has to meet a debcutable and it will cost $176.  pt is not qualified to use copyay card

## 2017-08-05 ENCOUNTER — Ambulatory Visit (INDEPENDENT_AMBULATORY_CARE_PROVIDER_SITE_OTHER): Payer: Medicare HMO | Admitting: Psychiatry

## 2017-08-05 ENCOUNTER — Encounter: Payer: Self-pay | Admitting: Psychiatry

## 2017-08-05 ENCOUNTER — Other Ambulatory Visit: Payer: Self-pay

## 2017-08-05 VITALS — BP 126/75 | HR 106 | Temp 97.7°F | Wt 251.8 lb

## 2017-08-05 DIAGNOSIS — F4310 Post-traumatic stress disorder, unspecified: Secondary | ICD-10-CM

## 2017-08-05 DIAGNOSIS — F251 Schizoaffective disorder, depressive type: Secondary | ICD-10-CM | POA: Diagnosis not present

## 2017-08-05 DIAGNOSIS — G2119 Other drug induced secondary parkinsonism: Secondary | ICD-10-CM

## 2017-08-05 MED ORDER — PERPHENAZINE 2 MG PO TABS: 2.0000 mg | ORAL_TABLET | Freq: Two times a day (BID) | ORAL | 1 refills | Status: DC

## 2017-08-05 MED ORDER — PERPHENAZINE 2 MG PO TABS: 2.0000 mg | ORAL_TABLET | Freq: Two times a day (BID) | ORAL | 0 refills | Status: DC

## 2017-08-05 NOTE — Patient Instructions (Signed)
Perphenazine tablets What is this medicine? PERPHENAZINE (per FEN a zeen) is used to treat schizophrenia. It also is used to treat severe nausea and vomiting in adults. This medicine may be used for other purposes; ask your health care provider or pharmacist if you have questions. COMMON BRAND NAME(S): Trilafon What should I tell my health care provider before I take this medicine? They need to know if you have any of these conditions: -blood disorders or disease -dementia -liver disease or jaundice -Parkinson's disease -uncontrollable movement disorder -an unusual or allergic reaction to perphenazine, other medicines, foods, dyes, or preservatives -pregnant or trying to get pregnant -breast-feeding How should I use this medicine? Take this medicine by mouth with a glass of water. Follow the directions on the prescription label. Take your doses at regular intervals. Do not take your medicine more often than directed. Talk to your pediatrician regarding the use of this medicine in children. While this medicine may be prescribed for children as young as 61 years of age for selected conditions, precautions do apply. Overdosage: If you think you have taken too much of this medicine contact a poison control center or emergency room at once. NOTE: This medicine is only for you. Do not share this medicine with others. What if I miss a dose? If you miss a dose, take it as soon as you can. If it is almost time for your next dose, take only that dose. Do not take double or extra doses. What may interact with this medicine? Do not take this medicine with any of the following medications: -amoxapine -antidepressants like citalopram, escitalopram, fluoxetine, paroxetine, and sertraline -certain antibiotics like gatifloxacin, grepafloxacin, sparfloxacin -cisapride -clozapine -dofetilide -droperidol -ibutilide -levomethadyl -maprotiline -phenothiazines like chlorpromazine, mesoridazine,  prochlorperazine, thioridazine -phenylpropanolamine -pimozide -pindolol -propranolol -sotalol -tricyclic antidepressants like amitriptyline, clomipramine, imipramine, nortriptyline and others -trimethobenzamide -ziprasidone This medicine may also interact with the following medications: -antacids -medicines for colds and flu -medicines for hay fever and other allergies -prescription pain medicines This list may not describe all possible interactions. Give your health care provider a list of all the medicines, herbs, non-prescription drugs, or dietary supplements you use. Also tell them if you smoke, drink alcohol, or use illegal drugs. Some items may interact with your medicine. What should I watch for while using this medicine? Visit your doctor or health care professional for regular checks on your progress. You may get drowsy or dizzy. Do not drive, use machinery, or do anything that needs mental alertness until you know how this medicine affects you. Do not stand or sit up quickly, especially if you are an older patient. This reduces the risk of dizzy or fainting spells. Alcohol may interfere with the effect of this medicine. Avoid alcoholic drinks. This medicine can reduce the response of your body to heat or cold. Dress warm in cold weather and stay hydrated in hot weather. If possible, avoid extreme temperatures like saunas, hot tubs, very hot or cold showers, or activities that can cause dehydration such as vigorous exercise. This medicine can make you more sensitive to the sun. Keep out of the sun. If you cannot avoid being in the sun, wear protective clothing and use sunscreen. Do not use sun lamps or tanning beds/booths. Your mouth may get dry. Chewing sugarless gum or sucking hard candy, and drinking plenty of water may help. Contact your doctor if the problem does not go away or is severe. What side effects may I notice from receiving this medicine? Side effects that  you should  report to your doctor or health care professional as soon as possible: -allergic reactions like skin rash, itching or hives, swelling of the face, lips, or tongue -blurred vision -breast enlargement in men or women -breast milk in women who are not breast-feeding -chest pain, fast or irregular heartbeat -confusion, restlessness -dark yellow or brown urine -difficulty breathing or swallowing -dizziness or fainting spells -drooling, shaking -fever, chills, sore throat -involuntary or uncontrollable movements of the eyes, mouth, head, arms, and legs -seizures -stomach area pain -unusual bleeding or bruising -unusually weak or tired -yellowing of skin or eyes Side effects that usually do not require medical attention (report to your doctor or health care professional if they continue or are bothersome): -difficulty passing urine -difficulty sleeping -headache -sexual dysfunction -skin rash, or itching This list may not describe all possible side effects. Call your doctor for medical advice about side effects. You may report side effects to FDA at 1-800-FDA-1088. Where should I keep my medicine? Keep out of the reach of children. Store at room temperature between 20 and 25 degrees C (68 and 77 degrees F). Protect from light. Throw away any unused medicine after the expiration date. NOTE: This sheet is a summary. It may not cover all possible information. If you have questions about this medicine, talk to your doctor, pharmacist, or health care provider.  2018 Elsevier/Gold Standard (2011-06-15 16:29:58)

## 2017-08-05 NOTE — Progress Notes (Signed)
[facility] MD OP Progress Note  08/05/2017 12:45 PM Heather Haley  MRN:  [id number]  Chief Complaint: ' I am here for follow up." Chief Complaint    Follow-up; Medication Refill; Medication Problem     HPI: Heather Haley is a 57 year old Caucasian female, divorced, lives in [place], has a history of depression, psychosis, drug-induced Parkinson's disease, memory problem, history of trauma, migraine headaches, diabetes mellitus, presented to the clinic today for a follow-up visit.  Patient today reports she could not get her Latuda filled due to the cost.  She reports she would like to try something new.  Patient did not want to be continued on Abilify due to concerns about weight gain.  Discussed Trilafon with patient.  Provided medication education.  Discussed with her that it does not cause a lot of weight gain and has low EPS risk.  Patient otherwise reports she is currently doing well on the other medications.  She continues to be compliant with her Remeron.  She reports sleep is good.  She reports she continues to have some perceptual disturbances of hearing clicks on and off.  She has been coping with it.  Patient denies any other concerns today. Visit Diagnosis:    ICD-10-CM   1. Schizoaffective disorder, depressive type ([place]) F25.1 perphenazine (TRILAFON) 2 MG tablet  2. PTSD (post-traumatic stress disorder) F43.10   3. Drug-induced Parkinsonism ([place]) G21.19     Past Psychiatric History: Reviewed past psychiatric history from my progress note on 05/25/2017  Past Medical History:  Past Medical History:  Diagnosis Date  . Anxiety   . Chronic kidney disease   . Chronic pain    previously saw Dr. Consuela Mimes in pain clinic, then saw pain specialist in [place]  . Depression   . Diabetes mellitus ([place])   . Frequency of urination   . GERD (gastroesophageal reflux disease)   . Headache(784.0)   . High cholesterol   . Hypertension   . IBS (irritable bowel syndrome)   . Left  ankle instability   . Left knee DJD   . Lumbar Degenerative Disc Disease of  10/11/2014  . Neuromuscular disorder ([place])   . Osteoarthritis of hip (Right) 05/05/2015  . Other enthesopathy of ankle and tarsus 12/15/2009   Qualifier: Diagnosis of  By: Oneida Alar MD, KARL    . Peripheral sensory neuropathy (Bilateral) 11/19/2014  . Postoperative nausea and vomiting   . Schizophrenia [facility])     Past Surgical History:  Procedure Laterality Date  . ABDOMINAL HYSTERECTOMY    . ANKLE SURGERY    . APPENDECTOMY    . COLONOSCOPY  2013  . COLONOSCOPY WITH PROPOFOL N/A 04/25/2017   Procedure: COLONOSCOPY WITH PROPOFOL;  Surgeon: Manya Silvas, MD;  [facility]COPY;  Service: Endoscopy;  Laterality: N/A;  . ESOPHAGOGASTRODUODENOSCOPY (EGD) WITH PROPOFOL N/A 10/03/2014   Procedure: ESOPHAGOGASTRODUODENOSCOPY (EGD) WITH PROPOFOL;  Surgeon: Josefine Class, MD;  Location: [facility] ENDOSCOPY;  Service: Endoscopy;  Laterality: N/A;  . ESOPHAGOGASTRODUODENOSCOPY (EGD) WITH PROPOFOL  04/25/2017   Procedure: ESOPHAGOGASTRODUODENOSCOPY (EGD) WITH PROPOFOL;  Surgeon: Manya Silvas, MD;  [facility];  Service: Endoscopy;;  . KNEE ARTHROSCOPY  1997   left knee  . LEFT HEART CATHETERIZATION WITH CORONARY ANGIOGRAM N/A 01/11/2013   Procedure: LEFT HEART CATHETERIZATION WITH CORONARY ANGIOGRAM;  Surgeon: Sinclair Grooms, MD;  Location: [facility] CATH LAB;  Service: Cardiovascular;  Laterality: N/A;  . TOTAL KNEE ARTHROPLASTY  08/30/2011   Procedure: TOTAL KNEE ARTHROPLASTY;  Surgeon: Audree Camel  Noemi Chapel, MD;  Location: [facility];  Service: Orthopedics;  Laterality: Left;    Family Psychiatric History: Reviewed family psychiatric history from my progress note on 05/25/2017.  Family History:  Family History  Problem Relation Age of Onset  . Cancer Mother   . Hypertension Father   . Heart disease Father   . Alcohol abuse Father    Substance abuse history: Denies  Social History: Reviewed social history  from my progress note on 05/25/2017. Social History   Socioeconomic History  . Marital status: Divorced    Spouse name: Not on file  . Number of children: 2  . Years of education: 26  . Highest education level: 11th grade  Occupational History    Employer: [employer]  . Financial resource strain: Somewhat hard  . Food insecurity:    Worry: Never true    Inability: Never true  . Transportation needs:    Medical: No    Non-medical: No  Tobacco Use  . Smoking status: Former Smoker    Packs/day: 2.00    Years: 15.00    Pack years: 30.00    Types: Cigarettes    Last attempt to quit: 02/09/1995    Years since quitting: 22.5  . Smokeless tobacco: Never Used  Substance and Sexual Activity  . Alcohol use: No    Alcohol/week: 0.0 oz  . Drug use: No  . Sexual activity: Not Currently  Lifestyle  . Physical activity:    Days per week: 0 days    Minutes per session: 0 min  . Stress: Rather much  Relationships  . Social connections:    Talks on phone: More than three times a week    Gets together: Once a week    Attends religious service: Never    Active member of club or organization: No    Attends meetings of clubs or organizations: Never    Relationship status: Divorced  Other Topics Concern  . Not on file  Social History Narrative   Patient lives at home alone. Patient works at [employer].   Caffeine daily- 2   Right handed.   Education- 11 th grade    Allergies:  Allergies  Allergen Reactions  . Benztropine Other (See Comments)    Hair fall out  Suicide thoughts  . Buprenorphine Hcl Other (See Comments)    Unable to void  . Codeine Hives    REACTION: hives  . Cymbalta [Duloxetine Hcl] Other (See Comments)    Altered mental status  . Duloxetine Other (See Comments)    Altered mental status Alopecia, visual hallucinations, nightmares  . Gabapentin Swelling  . Lyrica [Pregabalin] Other (See Comments)    Alopecia, visual hallucinations,  nightmares Altered mental status Alopecia, visual hallucinations, nightmares Altered mental status  . Morphine And Related Other (See Comments)    Unable to void  . Nortripytline Hcl [Nortriptyline] Other (See Comments)    Hair loss and night mares Hair loss and night mares  . Nsaids     REACTION: palpitations, diaphoresis  . Penicillin G Nausea And Vomiting  . Penicillins     REACTION: upset stomach  . Tolmetin Other (See Comments)    REACTION: palpitations, diaphoresis  . Wellbutrin [Bupropion]     Constipation, mood swings    Metabolic Disorder Labs: Lab Results  Component Value Date   HGBA1C 6.2 (H) 03/06/2017   MPG 131.24 03/06/2017   MPG 108 05/17/2015   Lab Results  Component Value Date  PROLACTIN 64.2 (H) 03/06/2017   Lab Results  Component Value Date   CHOL 175 03/06/2017   TRIG 234 (H) 03/06/2017   HDL 69 03/06/2017   CHOLHDL 2.5 03/06/2017   VLDL 47 (H) 03/06/2017   LDLCALC 59 03/06/2017   LDLCALC 93 05/17/2015   Lab Results  Component Value Date   TSH 1.289 03/06/2017   TSH 2.252 10/13/2012    Therapeutic Level Labs: No results found for: LITHIUM No results found for: VALPROATE No components found for:  CBMZ  Current Medications: Current Outpatient Medications  Medication Sig Dispense Refill  . acetaminophen (TYLENOL) 500 MG tablet Take 1 tablet (500 mg total) by mouth every 6 (six) hours as needed for mild pain. 30 tablet 0  . ASPIRIN PO Take 81 mg by mouth daily.     . cetirizine (ZYRTEC) 10 MG tablet Take 10 mg by mouth daily.    . Cholecalciferol (VITAMIN D PO) Take 2 tablets by mouth daily.     . clindamycin (CLEOCIN) 300 MG capsule Take 300 mg by mouth 3 (three) times daily.    . cyanocobalamin (,VITAMIN B-12,) 1000 MCG/ML injection Inject into the muscle.    . cyclobenzaprine (FLEXERIL) 10 MG tablet Take by mouth.    . docusate sodium (COLACE) 100 MG capsule Take 100 mg by mouth at bedtime.     . furosemide (LASIX) 20 MG tablet Take  20 mg by mouth.    Marland Kitchen HYDROcodone-acetaminophen (NORCO/VICODIN) 5-325 MG tablet Take 1 tablet by mouth every 6 (six) hours as needed for moderate pain.    . metFORMIN (GLUCOPHAGE) 500 MG tablet Take 1 tablet (500 mg total) by mouth 2 (two) times daily with a meal. For diabetes management (Patient taking differently: Take 500 mg by mouth as needed. If her is sugar is below 115 she does not take it) 10 tablet 0  . mirtazapine (REMERON) 7.5 MG tablet Take 1 tablet (7.5 mg total) by mouth at bedtime. 45 tablet 1  . OMEPRAZOLE PO Take by mouth.    . ondansetron (ZOFRAN) 4 MG tablet Take 4 mg by mouth every 8 (eight) hours as needed for nausea or vomiting.    Marland Kitchen perphenazine (TRILAFON) 2 MG tablet Take 1 tablet (2 mg total) by mouth 2 (two) times daily. 180 tablet 1  . perphenazine (TRILAFON) 2 MG tablet Take 1 tablet (2 mg total) by mouth 2 (two) times daily. 14 tablet 0  . pravastatin (PRAVACHOL) 40 MG tablet Take 40 mg by mouth at bedtime.     . ranitidine (ZANTAC) 150 MG tablet Take 150 mg by mouth daily.     . rizatriptan (MAXALT) 10 MG tablet Take 10 mg by mouth as needed for migraine. May repeat in 2 hours if needed    . senna (SENOKOT) 8.6 MG tablet Take 1-4 tablets (8.6-34.4 mg total) by mouth daily as needed for constipation.    . topiramate (TOPAMAX) 100 MG tablet Take 0.5 tablets (50 mg total) by mouth at bedtime. For mood control (Patient taking differently: Take 100 mg by mouth at bedtime. For mood control) 30 tablet 1  . traMADol (ULTRAM) 50 MG tablet Take 2 tablets (100 mg total) by mouth every 8 (eight) hours as needed for moderate pain or severe pain. 180 tablet 2  . verapamil (CALAN-SR) 120 MG CR tablet Take 1 tablet (120 mg total) by mouth at bedtime. For high blood pressure 90 tablet 1   No current facility-administered medications for this visit.  Musculoskeletal: Strength & Muscle Tone: within normal limits Gait & Station: walks with cane Patient leans: N/A  Psychiatric  Specialty Exam: Review of Systems  Psychiatric/Behavioral: The patient is nervous/anxious.   All other systems reviewed and are negative.   Blood pressure 126/75, pulse (!) 106, temperature 97.7 F (36.5 C), temperature source Oral, weight 251 lb 12.8 oz (114.2 kg).Body mass index is 41.9 kg/m.  General Appearance: Casual  Eye Contact:  Fair  Speech:  Clear and Coherent  Volume:  Normal  Mood:  Anxious  Affect:  Congruent  Thought Process:  Goal Directed and Descriptions of Associations: Intact  Orientation:  Full (Time, Place, and Person)  Thought Content: Delusions and Hallucinations: Auditory hears clicks on and off  Suicidal Thoughts:  No  Homicidal Thoughts:  No  Memory:  Immediate;   Fair Recent;   Fair Remote;   Fair  Judgement:  Fair  Insight:  Fair  Psychomotor Activity:  Normal  Concentration:  Concentration: Fair and Attention Span: Fair  Recall:  [employer] of Knowledge: Fair  Language: Fair  Akathisia:  No  Handed:  Right  AIMS (if indicated): 0  Assets:  Communication Skills Desire for Improvement Social Support  ADL's:  Intact  Cognition: WNL  Sleep:  improving   Screenings: AIMS     Office Visit from 06/29/2017 in [place] Office Visit from 03/24/2017 in [place] Admission (Discharged) from 03/03/2017 in [place] [facility]  AIMS Total Score  6  10  0    AUDIT     Admission (Discharged) from 03/03/2017 in [place] [facility]  Alcohol Use Disorder Identification Test Final Score (AUDIT)  0    GAD-7     Office Visit from 08/22/2015 in [place] at [facility]  Total GAD-7 Score  6    PHQ2-9     Office Visit from 03/23/2016 in [place] Office Visit from 08/22/2015 in [place] at [place] from 08/06/2015 in [place] Office Visit from  06/10/2015 in [place] from 05/19/2015 in [place]  PHQ-2 Total Score  0  1  0  0  0       Assessment and Plan: Heather Haley is a 57 year old Caucasian female, history of depression, psychosis, PTSD, migraine headaches, diabetes mellitus, drug-induced Parkinsonian symptoms, memory problems, presented to the clinic today for a follow-up visit.  Patient did not tolerate the Abilify well and was started on Latuda last visit.  However she could not afford the Latuda due to cost.  Discussed other medication changes today.  Plan as noted below.  Plan Schizoaffective disorder Discontinue Latuda due to cost. Start Trilafon 2 mg p.o. twice daily.  Anxiety sx/PTSD Remeron 7.5 mg p.o. nightly  For drug-induced Parkinson's symptoms Patient reports her movement disorder as better.  It may have been due to Abilify and since being off the Abilify she looks better with regards to her movement disorder. Aims equals 0 today.  Memory problems She will continue to work with her neurologist. MMSE-29/30 on 05/06/2017.  For insomnia Remeron 7.5 mg p.o. nightly  Follow-up in clinic in 6 weeks.  More than 50 % of the time was spent for psychoeducation and supportive psychotherapy and care coordination.  This note was generated in part or whole with voice recognition software. Voice recognition is  usually quite accurate but there are transcription errors that can and very often do occur. I apologize for any typographical errors that were not detected and corrected.       Ursula Alert, MD 08/05/2017, 12:45 PM

## 2017-08-10 ENCOUNTER — Telehealth: Payer: Self-pay

## 2017-08-10 DIAGNOSIS — F251 Schizoaffective disorder, depressive type: Secondary | ICD-10-CM

## 2017-08-10 MED ORDER — OLANZAPINE 5 MG PO TABS: 5.0000 mg | ORAL_TABLET | Freq: Every day | ORAL | 1 refills | Status: DC

## 2017-08-10 NOTE — Telephone Encounter (Signed)
pt called states that [facility] called and stated that the medication is going to cost $151  perphenazine (TRILAFON) 2 MG tablet  Medication  Date: 08/05/2017 Department: [facility] Ordering/Authorizing: Ursula Alert, MD  Order Providers   Prescribing Provider Encounter Provider  Ursula Alert, MD Ursula Alert, MD  Outpatient Medication Detail    Disp Refills Start End   perphenazine (TRILAFON) 2 MG tablet 180 tablet 1 08/05/2017    Sig - Route: Take 1 tablet (2 mg total) by mouth 2 (two) times daily. - Oral   Sent to pharmacy as: perphenazine (TRILAFON) 2 MG tablet   E-Prescribing Status: Receipt confirmed by pharmacy (08/05/2017 10:26 AM EDT)

## 2017-08-10 NOTE — Telephone Encounter (Signed)
called pharamcy , they states that it cost so much because pt insurance has a debuctable plan.

## 2017-08-10 NOTE — Telephone Encounter (Signed)
Called pt , discussed Zyprexa. Will sent to [facility].

## 2017-08-24 ENCOUNTER — Emergency Department: Payer: Medicare HMO

## 2017-08-24 ENCOUNTER — Other Ambulatory Visit: Payer: Self-pay

## 2017-08-24 ENCOUNTER — Encounter: Payer: Self-pay | Admitting: *Deleted

## 2017-08-24 ENCOUNTER — Emergency Department
Admission: EM | Admit: 2017-08-24 | Discharge: 2017-08-24 | Disposition: A | Discharge status: Home / Self Care | Payer: Medicare HMO | Attending: Emergency Medicine | Admitting: Emergency Medicine

## 2017-08-24 DIAGNOSIS — E1122 Type 2 diabetes mellitus with diabetic chronic kidney disease: Secondary | ICD-10-CM | POA: Diagnosis not present

## 2017-08-24 DIAGNOSIS — Z87891 Personal history of nicotine dependence: Secondary | ICD-10-CM | POA: Diagnosis not present

## 2017-08-24 DIAGNOSIS — N182 Chronic kidney disease, stage 2 (mild): Secondary | ICD-10-CM | POA: Diagnosis not present

## 2017-08-24 DIAGNOSIS — Z7984 Long term (current) use of oral hypoglycemic drugs: Secondary | ICD-10-CM | POA: Diagnosis not present

## 2017-08-24 DIAGNOSIS — R000 Tachycardia, unspecified: Secondary | ICD-10-CM | POA: Diagnosis not present

## 2017-08-24 DIAGNOSIS — R0602 Shortness of breath: Secondary | ICD-10-CM | POA: Diagnosis not present

## 2017-08-24 DIAGNOSIS — R635 Abnormal weight gain: Secondary | ICD-10-CM | POA: Diagnosis not present

## 2017-08-24 DIAGNOSIS — R0789 Other chest pain: Secondary | ICD-10-CM | POA: Diagnosis not present

## 2017-08-24 DIAGNOSIS — R42 Dizziness and giddiness: Secondary | ICD-10-CM | POA: Diagnosis not present

## 2017-08-24 DIAGNOSIS — I129 Hypertensive chronic kidney disease with stage 1 through stage 4 chronic kidney disease, or unspecified chronic kidney disease: Secondary | ICD-10-CM | POA: Diagnosis not present

## 2017-08-24 DIAGNOSIS — E1140 Type 2 diabetes mellitus with diabetic neuropathy, unspecified: Secondary | ICD-10-CM | POA: Insufficient documentation

## 2017-08-24 DIAGNOSIS — Z96652 Presence of left artificial knee joint: Secondary | ICD-10-CM | POA: Insufficient documentation

## 2017-08-24 DIAGNOSIS — Z79899 Other long term (current) drug therapy: Secondary | ICD-10-CM | POA: Insufficient documentation

## 2017-08-24 DIAGNOSIS — Z7982 Long term (current) use of aspirin: Secondary | ICD-10-CM | POA: Insufficient documentation

## 2017-08-24 DIAGNOSIS — T50905A Adverse effect of unspecified drugs, medicaments and biological substances, initial encounter: Secondary | ICD-10-CM | POA: Diagnosis not present

## 2017-08-24 DIAGNOSIS — E119 Type 2 diabetes mellitus without complications: Secondary | ICD-10-CM | POA: Diagnosis not present

## 2017-08-24 LAB — CBC
HEMATOCRIT: 44.6 % (ref 35.0–47.0)
Hemoglobin: 15.1 g/dL (ref 12.0–16.0)
MCH: 29.4 pg (ref 26.0–34.0)
MCHC: 33.9 g/dL (ref 32.0–36.0)
MCV: 86.7 fL (ref 80.0–100.0)
Platelets: 296 10*3/uL (ref 150–440)
RBC: 5.14 MIL/uL (ref 3.80–5.20)
RDW: 13.7 % (ref 11.5–14.5)
WBC: 10.2 10*3/uL (ref 3.6–11.0)

## 2017-08-24 LAB — BASIC METABOLIC PANEL
Anion gap: 10 (ref 5–15)
BUN: 15 mg/dL (ref 6–20)
CO2: 25 mmol/L (ref 22–32)
Calcium: 9.6 mg/dL (ref 8.9–10.3)
Chloride: 103 mmol/L (ref 98–111)
Creatinine, Ser: 1.04 mg/dL — ABNORMAL HIGH (ref 0.44–1.00)
GFR calc Af Amer: >60 mL/min (ref >60)
GFR calc non Af Amer: 58 mL/min — ABNORMAL LOW (ref >60)
GLUCOSE: 196 mg/dL — AB (ref 70–99)
POTASSIUM: 3.5 mmol/L (ref 3.5–5.1)
Sodium: 138 mmol/L (ref 135–145)

## 2017-08-24 LAB — TROPONIN I: Troponin I: <0.03 ng/mL (ref <0.03)

## 2017-08-24 LAB — TSH: TSH: 2.766 u[IU]/mL (ref 0.350–4.500)

## 2017-08-24 LAB — POCT PREGNANCY, URINE: PREG TEST UR: NEGATIVE

## 2017-08-24 MED ORDER — SODIUM CHLORIDE 0.9 % IV SOLN
Freq: Once | INTRAVENOUS | Status: AC
  Administered 2017-08-24: 19:00:00 via INTRAVENOUS

## 2017-08-24 NOTE — ED Triage Notes (Signed)
Pt to ED from [facility] clinic with tachycardia and intermittent dizziness and SOB. PT reports the tachycardia has been intermittent for the past couple months but has been worsening into dizziness and SOB upon exertion. Pt is unable to bend forward without "feeling a sloshing in my chest" and is unable to ambulate more than 10 ft without having to stop to rest. Bilateral lower ext swelling with no known hx of CHF.

## 2017-08-24 NOTE — ED Provider Notes (Signed)
[facility] Emergency Department Provider Note   ____________________________________________   I have reviewed the triage vital signs and the nursing notes.   HISTORY  Chief Complaint Tachycardia and Dizziness   History limited by: Not Limited   HPI Heather Haley is a 57 y.o. female who presents to the emergency department today from her primary care doctor's office because of concerns for fast heart rate.  Patient states she has been having issues with an increased heart rate for couple months.  She states it does come and go.  It is worse when she exerts herself or when her ankle is hurting her.  She does however state that for the past few days it has gotten worse. She denies any associated chest pain. Does state she thinks that it might be related to her medications.   Per medical record review patient has a history of DM, HTN, HLD.  Past Medical History:  Diagnosis Date  . Anxiety   . Chronic kidney disease   . Chronic pain    previously saw Dr. Consuela Mimes in pain clinic, then saw pain specialist in [place]  . Depression   . Diabetes mellitus ([place])   . Frequency of urination   . GERD (gastroesophageal reflux disease)   . Headache(784.0)   . High cholesterol   . Hypertension   . IBS (irritable bowel syndrome)   . Left ankle instability   . Left knee DJD   . Lumbar Degenerative Disc Disease of  10/11/2014  . Neuromuscular disorder ([place])   . Osteoarthritis of hip (Right) 05/05/2015  . Other enthesopathy of ankle and tarsus 12/15/2009   Qualifier: Diagnosis of  By: Oneida Alar MD, KARL    . Peripheral sensory neuropathy (Bilateral) 11/19/2014  . Postoperative nausea and vomiting   . Schizophrenia [facility])     Patient Active Problem List   Diagnosis Date Noted  . MDD (major depressive disorder), recurrent, severe, with psychosis ([place]) 03/03/2017  . Postmenopausal 10/15/2016  . CKD (chronic kidney disease) stage 2, GFR 60-89 ml/min 10/14/2016  .  Muscle spasms of neck 10/14/2016  . Unintended weight gain 10/14/2016  . Contracture, tendon sheath 03/23/2016  . Medication overuse headache 11/12/2015  . Tremor 11/12/2015  . Dysuria 06/17/2015  . Pure hypercholesterolemia 05/29/2015  . Opioid-induced constipation (OIC) 05/19/2015  . Osteoarthritis of hip (Right) 05/08/2015  . Chronic hip pain (Right) 05/05/2015  . Long term prescription opiate use 05/05/2015  . Chronic knee pain (S/P TKR:Total Knee Replacement) (Left) 05/05/2015  . Osteoarthritis of hips (Location of Secondary source of pain) (Bilateral) (Right) 05/05/2015  . History of total knee replacement (Left) 05/05/2015  . Chronic groin pain (Location of Secondary source of pain) (Right) 05/05/2015  . Chronic lower extremity pain (Location of Tertiary source of pain) (Bilateral) (R>L) 05/05/2015  . Chronic pain 02/05/2015  . Chronic ankle pain (secondary to a work-related injury) (Date of injury 04/17/2006) (Left) 12/25/2014  . Chronic low back pain (Location of Primary Pain) (Bilateral) (R>L) 12/25/2014  . Lumbar spondylosis (L3-4 & L4-5) 12/25/2014  . Lumbar facet syndrome (Location of Primary Source of Pain) (Bilateral) (R>L) 12/25/2014  . Encounter for therapeutic drug level monitoring 11/19/2014  . Encounter for long-term opiate analgesic use 11/19/2014  . Long-term current use of opiate analgesic 11/19/2014  . Uncomplicated opioid dependence ([place]) 11/19/2014  . Opiate use (35 MME/Day) 11/19/2014  . Chronic pain syndrome 11/19/2014  . Diabetic sensory peripheral neuropathy (Bilateral Lower Extremity) 11/19/2014  . Generalized anxiety disorder 11/19/2014  .  History of panic attacks 11/19/2014  . History of tobacco abuse 11/19/2014  . GERD (gastroesophageal reflux disease) 11/19/2014  . Non-insulin dependent type 2 diabetes mellitus ([place]) 11/19/2014  . Coccygeal pain 11/19/2014  . History of migraine 11/19/2014  . Chronic constipation 10/24/2013  . Personal history of  other diseases of the digestive system 10/24/2013  . History of gastroesophageal reflux (GERD) 10/24/2013  . Leg length inequality 09/12/2012  . Morbid obesity ([place]) 05/26/2012  . Hypertension   . IBS (irritable bowel syndrome)   . Depression, major, recurrent, moderate ([place])   . ADVERSE DRUG REACTION 12/15/2009    Past Surgical History:  Procedure Laterality Date  . ABDOMINAL HYSTERECTOMY    . ANKLE SURGERY    . APPENDECTOMY    . COLONOSCOPY  2013  . COLONOSCOPY WITH PROPOFOL N/A 04/25/2017   Procedure: COLONOSCOPY WITH PROPOFOL;  Surgeon: Manya Silvas, MD;  Location: [facility] ENDOSCOPY;  Service: Endoscopy;  Laterality: N/A;  . ESOPHAGOGASTRODUODENOSCOPY (EGD) WITH PROPOFOL N/A 10/03/2014   Procedure: ESOPHAGOGASTRODUODENOSCOPY (EGD) WITH PROPOFOL;  Surgeon: Josefine Class, MD;  Location: [facility] ENDOSCOPY;  Service: Endoscopy;  Laterality: N/A;  . ESOPHAGOGASTRODUODENOSCOPY (EGD) WITH PROPOFOL  04/25/2017   Procedure: ESOPHAGOGASTRODUODENOSCOPY (EGD) WITH PROPOFOL;  Surgeon: Manya Silvas, MD;  Location: [facility] ENDOSCOPY;  Service: Endoscopy;;  . KNEE ARTHROSCOPY  1997   left knee  . LEFT HEART CATHETERIZATION WITH CORONARY ANGIOGRAM N/A 01/11/2013   Procedure: LEFT HEART CATHETERIZATION WITH CORONARY ANGIOGRAM;  Surgeon: Sinclair Grooms, MD;  Location: [facility] CATH LAB;  Service: Cardiovascular;  Laterality: N/A;  . TOTAL KNEE ARTHROPLASTY  08/30/2011   Procedure: TOTAL KNEE ARTHROPLASTY;  Surgeon: Lorn Junes, MD;  Location: [place];  Service: Orthopedics;  Laterality: Left;    Prior to Admission medications   Medication Sig Start Date End Date Taking? Authorizing Provider  acetaminophen (TYLENOL) 500 MG tablet Take 1 tablet (500 mg total) by mouth every 6 (six) hours as needed for mild pain. 03/10/17   Lindell Spar I, NP  ASPIRIN PO Take 81 mg by mouth daily.     [provider]  cetirizine (ZYRTEC) 10 MG tablet Take 10 mg by mouth daily.    [provider]   Cholecalciferol (VITAMIN D PO) Take 2 tablets by mouth daily.     [provider]  clindamycin (CLEOCIN) 300 MG capsule Take 300 mg by mouth 3 (three) times daily.    [provider]  cyanocobalamin (,VITAMIN B-12,) 1000 MCG/ML injection Inject into the muscle. 05/18/17   [provider]  cyclobenzaprine (FLEXERIL) 10 MG tablet Take by mouth.    [provider]  docusate sodium (COLACE) 100 MG capsule Take 100 mg by mouth at bedtime.     [provider]  furosemide (LASIX) 20 MG tablet Take 20 mg by mouth.    [provider]  HYDROcodone-acetaminophen (NORCO/VICODIN) 5-325 MG tablet Take 1 tablet by mouth every 6 (six) hours as needed for moderate pain.    [provider]  metFORMIN (GLUCOPHAGE) 500 MG tablet Take 1 tablet (500 mg total) by mouth 2 (two) times daily with a meal. For diabetes management Patient taking differently: Take 500 mg by mouth as needed. If her is sugar is below 115 she does not take it 03/10/17   Lindell Spar I, NP  mirtazapine (REMERON) 7.5 MG tablet Take 1 tablet (7.5 mg total) by mouth at bedtime. 06/29/17   Ursula Alert, MD  OLANZapine (ZYPREXA) 5 MG tablet Take 1  tablet (5 mg total) by mouth at bedtime. 08/10/17   Ursula Alert, MD  OMEPRAZOLE PO Take by mouth.    [provider]  ondansetron (ZOFRAN) 4 MG tablet Take 4 mg by mouth every 8 (eight) hours as needed for nausea or vomiting.    [provider]  pravastatin (PRAVACHOL) 40 MG tablet Take 40 mg by mouth at bedtime.     [provider]  ranitidine (ZANTAC) 150 MG tablet Take 150 mg by mouth daily.     [provider]  rizatriptan (MAXALT) 10 MG tablet Take 10 mg by mouth as needed for migraine. May repeat in 2 hours if needed    [provider]  senna (SENOKOT) 8.6 MG tablet Take 1-4 tablets (8.6-34.4 mg total) by mouth daily as needed for constipation. 03/10/17   Lindell Spar I, NP  topiramate (TOPAMAX)  100 MG tablet Take 0.5 tablets (50 mg total) by mouth at bedtime. For mood control Patient taking differently: Take 100 mg by mouth at bedtime. For mood control 05/06/17   Ursula Alert, MD  traMADol (ULTRAM) 50 MG tablet Take 2 tablets (100 mg total) by mouth every 8 (eight) hours as needed for moderate pain or severe pain. 03/10/17   Lindell Spar I, NP  verapamil (CALAN-SR) 120 MG CR tablet Take 1 tablet (120 mg total) by mouth at bedtime. For high blood pressure 03/10/17   Lindell Spar I, NP    Allergies Benztropine; Buprenorphine hcl; Codeine; Cymbalta [duloxetine hcl]; Duloxetine; Gabapentin; Lyrica [pregabalin]; Morphine and related; Nortripytline hcl [nortriptyline]; Nsaids; Penicillin g; Penicillins; Tolmetin; and Wellbutrin [bupropion]  Family History  Problem Relation Age of Onset  . Cancer Mother   . Hypertension Father   . Heart disease Father   . Alcohol abuse Father     Social History Social History   Tobacco Use  . Smoking status: Former Smoker    Packs/day: 2.00    Years: 15.00    Pack years: 30.00    Types: Cigarettes    Last attempt to quit: 02/09/1995    Years since quitting: 22.5  . Smokeless tobacco: Never Used  Substance Use Topics  . Alcohol use: No    Alcohol/week: 0.0 oz  . Drug use: No    Review of Systems Constitutional: No fever/chills Eyes: No visual changes. ENT: No sore throat. Cardiovascular: Positive for fast heart rate. Respiratory: Denies shortness of breath. Gastrointestinal: No abdominal pain.  No nausea, no vomiting.  No diarrhea.   Genitourinary: Negative for dysuria. Musculoskeletal: Negative for back pain. Skin: Negative for rash. Neurological: Negative for headaches, focal weakness or numbness.  ____________________________________________   PHYSICAL EXAM:  VITAL SIGNS: ED Triage Vitals  Enc Vitals Group     BP 08/24/17 1720 (!) 134/100     Pulse Rate 08/24/17 1720 (!) 124     Resp 08/24/17 1720 16     Temp 08/24/17 1720  98.4 F (36.9 C)     Temp Source 08/24/17 1720 Oral     SpO2 08/24/17 1720 97 %     Weight 08/24/17 1721 255 lb (115.7 kg)     Height 08/24/17 1721 5\' 5"  (1.651 m)     Head Circumference --      Peak Flow --      Pain Score 08/24/17 1720 8   Constitutional: Alert and oriented.  Eyes: Conjunctivae are normal.  ENT      Head: Normocephalic and atraumatic.      Nose: No congestion/rhinnorhea.  Mouth/Throat: Mucous membranes are moist.      Neck: No stridor. Hematological/Lymphatic/Immunilogical: No cervical lymphadenopathy. Cardiovascular: Tachycardic, regular rhythm.  No murmurs, rubs, or gallops.  Respiratory: Normal respiratory effort without tachypnea nor retractions. Breath sounds are clear and equal bilaterally. No wheezes/rales/rhonchi. Gastrointestinal: Soft and non tender. No rebound. No guarding.  Genitourinary: Deferred Musculoskeletal: Normal range of motion in all extremities. No lower extremity edema. Neurologic:  Normal speech and language. No gross focal neurologic deficits are appreciated.  Skin:  Skin is warm, dry and intact. No rash noted. Psychiatric: Mood and affect are normal. Speech and behavior are normal. Patient exhibits appropriate insight and judgment.  ____________________________________________    LABS (pertinent positives/negatives)  Upreg negative BMP na 138, k 3.5, glu 196, cr 1.04 CBC wbc 10.2, hgb 15.1, plt 296 Trop <0.03 TSH 2.766 ____________________________________________   EKG  I, Nance Pear, attending physician, personally viewed and interpreted this EKG  EKG Time: 1723 Rate: 119 Rhythm: sinus tachycardia Axis: normal Intervals: qtc 451 QRS: narrow ST changes: no st elevation Impression: abnormal ekg   ____________________________________________    RADIOLOGY  CXR No acute  findings  ____________________________________________   PROCEDURES  Procedures  ____________________________________________   INITIAL IMPRESSION / ASSESSMENT AND PLAN / ED COURSE  Pertinent labs & imaging results that were available during my care of the patient were reviewed by me and considered in my medical decision making (see chart for details).   Patient presented to the emergency department today because of concerns for fast heart rate.  Differential would include hyperthyroid, anemia, dehydration, medication side effect amongst other etiologies.  Patient's work-up here without concerning findings.  She was given IV fluids and her heart rate did improve.  At this point given that this is been going on for months and there are no acute findings think it is reasonable for her to be discharged to follow-up with primary care.  ____________________________________________   FINAL CLINICAL IMPRESSION(S) / ED DIAGNOSES  Final diagnoses:  Tachycardia     Note: This dictation was prepared with Dragon dictation. Any transcriptional errors that result from this process are unintentional     Nance Pear, MD 08/24/17 2044

## 2017-08-24 NOTE — ED Notes (Signed)
Pt reports having a fast heart rate, feeling dizzy.  Sx for several weeks.  Pt sent from [facility] for eval.  No chest pain   No n/v/   Pt reports pain in left foot and ankle and is taking pain meds.   Pt alert  Speech clear.  Iv in place   Sinus tach on monitor.

## 2017-08-24 NOTE — ED Notes (Signed)
Report off to vanessa rn 

## 2017-08-24 NOTE — Discharge Instructions (Addendum)
Please seek medical attention for any high fevers, chest pain, shortness of breath, change in behavior, persistent vomiting, bloody stool or any other new or concerning symptoms.  

## 2017-08-27 DIAGNOSIS — R635 Abnormal weight gain: Secondary | ICD-10-CM | POA: Insufficient documentation

## 2017-08-27 DIAGNOSIS — T50905A Adverse effect of unspecified drugs, medicaments and biological substances, initial encounter: Secondary | ICD-10-CM

## 2017-09-01 DIAGNOSIS — E538 Deficiency of other specified B group vitamins: Secondary | ICD-10-CM | POA: Diagnosis not present

## 2017-09-13 DIAGNOSIS — M79605 Pain in left leg: Secondary | ICD-10-CM | POA: Diagnosis not present

## 2017-09-13 DIAGNOSIS — M24542 Contracture, left hand: Secondary | ICD-10-CM | POA: Diagnosis not present

## 2017-09-13 DIAGNOSIS — G8929 Other chronic pain: Secondary | ICD-10-CM | POA: Diagnosis not present

## 2017-09-13 DIAGNOSIS — M25562 Pain in left knee: Secondary | ICD-10-CM | POA: Diagnosis not present

## 2017-09-13 DIAGNOSIS — G5602 Carpal tunnel syndrome, left upper limb: Secondary | ICD-10-CM | POA: Diagnosis not present

## 2017-09-13 DIAGNOSIS — M7918 Myalgia, other site: Secondary | ICD-10-CM | POA: Diagnosis not present

## 2017-09-13 DIAGNOSIS — M533 Sacrococcygeal disorders, not elsewhere classified: Secondary | ICD-10-CM | POA: Diagnosis not present

## 2017-09-20 ENCOUNTER — Ambulatory Visit: Payer: Medicare HMO | Admitting: Psychiatry

## 2017-09-20 ENCOUNTER — Encounter: Payer: Self-pay | Admitting: Psychiatry

## 2017-09-20 ENCOUNTER — Other Ambulatory Visit: Payer: Self-pay

## 2017-09-20 VITALS — BP 139/89 | HR 101 | Temp 98.0°F | Wt 258.6 lb

## 2017-09-20 DIAGNOSIS — G2119 Other drug induced secondary parkinsonism: Secondary | ICD-10-CM | POA: Diagnosis not present

## 2017-09-20 DIAGNOSIS — F251 Schizoaffective disorder, depressive type: Secondary | ICD-10-CM

## 2017-09-20 DIAGNOSIS — F411 Generalized anxiety disorder: Secondary | ICD-10-CM | POA: Diagnosis not present

## 2017-09-20 DIAGNOSIS — F4310 Post-traumatic stress disorder, unspecified: Secondary | ICD-10-CM

## 2017-09-20 NOTE — Progress Notes (Signed)
[place] MD OP Progress Note  09/20/2017 12:13 PM Heather Haley  MRN:  [id number]  Chief Complaint: ' I am here for follow up." Chief Complaint    Follow-up; Medication Refill     HPI: Heather Haley is a 57 year old Caucasian female, divorced, lives in [place], has a history of depression, psychosis, drug-induced Parkinson's disease, memory problems, history of trauma, migraine headaches, diabetes mellitus, presented to the clinic today for a follow-up visit.  Today reports she is tolerating the Zyprexa well.  She reports it has improved her mood and she denies any hallucinations or paranoia at this time.  She reports she does not have any abnormal involuntary movements or tremors anymore.  She however does struggle with weight gain issues.  She is mostly sedentary and is unable to do exercise due to her joint problems which is also causing her to gain more weight.  Patient does report increased appetite.  Discussed with patient her medications does have that side effect.  She however failed or was unable to afford or had side effects to a lot of other antipsychotic medications which were tried in the past.  Discussed with patient that her mirtazapine can be discontinued since Zyprexa also helps with sleep.  She agrees with plan.  Patient denies any suicidality or homicidality.  Patient denies any other concerns today. Visit Diagnosis:    ICD-10-CM   1. Schizoaffective disorder, depressive type ([place]) F25.1   2. PTSD (post-traumatic stress disorder) F43.10   3. Drug-induced Parkinsonism ([place]) G21.19   4. Generalized anxiety disorder F41.1     Past Psychiatric History: Have reviewed past psychiatric history from my progress note on 05/25/2017.  Past Medical History:  Past Medical History:  Diagnosis Date  . Anxiety   . Chronic kidney disease   . Chronic pain    previously saw Dr. Consuela Mimes in pain clinic, then saw pain specialist in [place]  . Depression   . Diabetes mellitus ([place])   .  Frequency of urination   . GERD (gastroesophageal reflux disease)   . Headache(784.0)   . High cholesterol   . Hypertension   . IBS (irritable bowel syndrome)   . Left ankle instability   . Left knee DJD   . Lumbar Degenerative Disc Disease of  10/11/2014  . Neuromuscular disorder ([place])   . Osteoarthritis of hip (Right) 05/05/2015  . Other enthesopathy of ankle and tarsus 12/15/2009   Qualifier: Diagnosis of  By: Oneida Alar MD, KARL    . Peripheral sensory neuropathy (Bilateral) 11/19/2014  . Postoperative nausea and vomiting   . Schizophrenia [facility])     Past Surgical History:  Procedure Laterality Date  . ABDOMINAL HYSTERECTOMY    . ANKLE SURGERY    . APPENDECTOMY    . COLONOSCOPY  2013  . COLONOSCOPY WITH PROPOFOL N/A 04/25/2017   Procedure: COLONOSCOPY WITH PROPOFOL;  Surgeon: Manya Silvas, MD;  Location: [facility] ENDOSCOPY;  Service: Endoscopy;  Laterality: N/A;  . ESOPHAGOGASTRODUODENOSCOPY (EGD) WITH PROPOFOL N/A 10/03/2014   Procedure: ESOPHAGOGASTRODUODENOSCOPY (EGD) WITH PROPOFOL;  Surgeon: Josefine Class, MD;  Location[facility] ENDOSCOPY;  Service: Endoscopy;  Laterality: N/A;  . ESOPHAGOGASTRODUODENOSCOPY (EGD) WITH PROPOFOL  04/25/2017   Procedure: ESOPHAGOGASTRODUODENOSCOPY (EGD) WITH PROPOFOL;  Surgeon: Manya Silvas, MD;  Location: [facility] ENDOSCOPY;  Service: Endoscopy;;  . KNEE ARTHROSCOPY  1997   left knee  . LEFT HEART CATHETERIZATION WITH CORONARY ANGIOGRAM N/A 01/11/2013   Procedure: LEFT HEART CATHETERIZATION WITH CORONARY ANGIOGRAM;  Surgeon: Sinclair Grooms, MD;  Location: [place] CATH LAB;  Service: Cardiovascular;  Laterality: N/A;  . TOTAL KNEE ARTHROPLASTY  08/30/2011   Procedure: TOTAL KNEE ARTHROPLASTY;  Surgeon: Lorn Junes, MD;  Location: [place];  Service: Orthopedics;  Laterality: Left;    Family Psychiatric History: Reviewed family psychiatric history from my progress note on 05/25/2017.  Family History:  Family History  Problem Relation Age of Onset   . Cancer Mother   . Hypertension Father   . Heart disease Father   . Alcohol abuse Father     Social History: Reviewed social history from my progress note on 05/25/2017. Social History   Socioeconomic History  . Marital status: Divorced    Spouse name: Not on file  . Number of children: 2  . Years of education: 54  . Highest education level: 11th grade  Occupational History    Employer: [employer]  . Financial resource strain: Somewhat hard  . Food insecurity:    Worry: Never true    Inability: Never true  . Transportation needs:    Medical: No    Non-medical: No  Tobacco Use  . Smoking status: Former Smoker    Packs/day: 2.00    Years: 15.00    Pack years: 30.00    Types: Cigarettes    Last attempt to quit: 02/09/1995    Years since quitting: 22.6  . Smokeless tobacco: Never Used  Substance and Sexual Activity  . Alcohol use: No    Alcohol/week: 0.0 standard drinks  . Drug use: No  . Sexual activity: Not Currently  Lifestyle  . Physical activity:    Days per week: 0 days    Minutes per session: 0 min  . Stress: Rather much  Relationships  . Social connections:    Talks on phone: More than three times a week    Gets together: Once a week    Attends religious service: Never    Active member of club or organization: No    Attends meetings of clubs or organizations: Never    Relationship status: Divorced  Other Topics Concern  . Not on file  Social History Narrative   Patient lives at home alone. Patient works at [employer].   Caffeine daily- 2   Right handed.   Education- 11 th grade    Allergies:  Allergies  Allergen Reactions  . Prolixin Decanoate [Fluphenazine]   . Benztropine Other (See Comments)    Hair fall out  Suicide thoughts  . Buprenorphine Hcl Other (See Comments)    Unable to void  . Codeine Hives    REACTION: hives  . Cymbalta [Duloxetine Hcl] Other (See Comments)    Altered mental status  . Duloxetine Other  (See Comments)    Altered mental status Alopecia, visual hallucinations, nightmares  . Gabapentin Swelling  . Lyrica [Pregabalin] Other (See Comments)    Alopecia, visual hallucinations, nightmares Altered mental status Alopecia, visual hallucinations, nightmares Altered mental status  . Morphine And Related Other (See Comments)    Unable to void  . Nortripytline Hcl [Nortriptyline] Other (See Comments)    Hair loss and night mares Hair loss and night mares  . Nsaids     REACTION: palpitations, diaphoresis  . Penicillin G Nausea And Vomiting  . Penicillins Other (See Comments)    REACTION: upset stomach Has patient had a PCN reaction causing immediate rash, facial/tongue/throat swelling, SOB or lightheadedness with hypotension: No Has patient had a PCN reaction causing severe rash involving mucus membranes  or skin necrosis: No Has patient had a PCN reaction that required hospitalization: No Has patient had a PCN reaction occurring within the last 10 years: No If all of the above answers are "NO", then may proceed with Cephalosporin use.  . Tolmetin Other (See Comments)    REACTION: palpitations, diaphoresis  . Wellbutrin [Bupropion]     Constipation, mood swings    Metabolic Disorder Labs: Lab Results  Component Value Date   HGBA1C 6.2 (H) 03/06/2017   MPG 131.24 03/06/2017   MPG 108 05/17/2015   Lab Results  Component Value Date   PROLACTIN 64.2 (H) 03/06/2017   Lab Results  Component Value Date   CHOL 175 03/06/2017   TRIG 234 (H) 03/06/2017   HDL 69 03/06/2017   CHOLHDL 2.5 03/06/2017   VLDL 47 (H) 03/06/2017   LDLCALC 59 03/06/2017   LDLCALC 93 05/17/2015   Lab Results  Component Value Date   TSH 2.766 08/24/2017   TSH 1.289 03/06/2017    Therapeutic Level Labs: No results found for: LITHIUM No results found for: VALPROATE No components found for:  CBMZ  Current Medications: Current Outpatient Medications  Medication Sig Dispense Refill  .  acetaminophen (TYLENOL) 500 MG tablet Take 1 tablet (500 mg total) by mouth every 6 (six) hours as needed for mild pain. (Patient taking differently: Take 500 mg by mouth daily as needed for mild pain. ) 30 tablet 0  . albuterol (PROVENTIL HFA;VENTOLIN HFA) 108 (90 Base) MCG/ACT inhaler Inhale 2 puffs into the lungs every 6 (six) hours as needed for wheezing or shortness of breath.    Marland Kitchen aspirin 81 MG chewable tablet Chew 81 mg by mouth daily.    . cetirizine (ZYRTEC) 10 MG tablet Take 10 mg by mouth daily.    . cholecalciferol (VITAMIN D) 1000 units tablet Take 1,000 Units by mouth daily.    . cyclobenzaprine (FLEXERIL) 10 MG tablet Take 10 mg by mouth 3 (three) times daily as needed for muscle spasms.     Marland Kitchen docusate sodium (COLACE) 100 MG capsule Take 100 mg by mouth daily.     . fluticasone (FLOVENT HFA) 110 MCG/ACT inhaler Inhale 1 puff into the lungs 2 (two) times daily as needed (shortness of breath).    . furosemide (LASIX) 20 MG tablet Take 20 mg by mouth daily.     Marland Kitchen HYDROcodone-acetaminophen (NORCO/VICODIN) 5-325 MG tablet Take 0.5 tablets by mouth 2 (two) times daily. Every afternoon and at bedtime    . hydroxypropyl methylcellulose / hypromellose (ISOPTO TEARS / GONIOVISC) 2.5 % ophthalmic solution Place 1 drop into both eyes every 4 (four) hours as needed for dry eyes.    . metFORMIN (GLUCOPHAGE) 500 MG tablet Take 1 tablet (500 mg total) by mouth 2 (two) times daily with a meal. For diabetes management (Patient taking differently: Take 500 mg by mouth at bedtime. ) 10 tablet 0  . methocarbamol (ROBAXIN) 500 MG tablet Take 500 mg by mouth daily as needed for muscle spasms.     Marland Kitchen OLANZapine (ZYPREXA) 5 MG tablet Take 1 tablet (5 mg total) by mouth at bedtime. 90 tablet 1  . omeprazole (PRILOSEC) 40 MG capsule Take 40 mg by mouth daily.    . ondansetron (ZOFRAN) 4 MG tablet Take 4 mg by mouth every 8 (eight) hours as needed for nausea or vomiting.    . pravastatin (PRAVACHOL) 40 MG tablet  Take 40 mg by mouth at bedtime.     . ranitidine (ZANTAC) 150  MG tablet Take 150 mg by mouth 2 (two) times daily as needed for heartburn.     . rizatriptan (MAXALT) 10 MG tablet Take 10 mg by mouth as needed for migraine. May repeat in 2 hours if needed    . senna (SENOKOT) 8.6 MG tablet Take 1-4 tablets (8.6-34.4 mg total) by mouth daily as needed for constipation. (Patient taking differently: Take 1 tablet by mouth at bedtime. )    . topiramate (TOPAMAX) 100 MG tablet Take 0.5 tablets (50 mg total) by mouth at bedtime. For mood control 30 tablet 1  . traMADol (ULTRAM) 50 MG tablet Take 2 tablets (100 mg total) by mouth every 8 (eight) hours as needed for moderate pain or severe pain. (Patient taking differently: Take 50-100 mg by mouth 3 (three) times daily. 100 mg every morning, 50 mg every afternoon, and 50 mg at bedtime) 180 tablet 2  . verapamil (CALAN-SR) 120 MG CR tablet Take 1 tablet (120 mg total) by mouth at bedtime. For high blood pressure 90 tablet 1  . vitamin B-12 (CYANOCOBALAMIN) 1000 MCG tablet Take 1,000 mcg by mouth daily.     No current facility-administered medications for this visit.      Musculoskeletal: Strength & Muscle Tone: within normal limits Gait & Station: normal Patient leans: N/A  Psychiatric Specialty Exam: Review of Systems  Psychiatric/Behavioral: The patient is nervous/anxious.   All other systems reviewed and are negative.   Blood pressure 139/89, pulse (!) 101, temperature 98 F (36.7 C), temperature source Oral, weight 258 lb 9.6 oz (117.3 kg).Body mass index is 43.03 kg/m.  General Appearance: Casual  Eye Contact:  Fair  Speech:  Clear and Coherent  Volume:  Normal  Mood:  Anxious improving  Affect:  Congruent  Thought Process:  Goal Directed and Descriptions of Associations: Intact  Orientation:  Full (Time, Place, and Person)  Thought Content: Logical   Suicidal Thoughts:  No  Homicidal Thoughts:  No  Memory:  Immediate;   Fair Recent;    Fair Remote;   Fair  Judgement:  Fair  Insight:  Fair  Psychomotor Activity:  Normal  Concentration:  Concentration: Fair and Attention Span: Fair  Recall:  [employer] of Knowledge: Fair  Language: Fair  Akathisia:  No  Handed:  Right  AIMS (if indicated):0  Assets:  Communication Skills Desire for Improvement Social Support  ADL's:  Intact  Cognition: WNL  Sleep:  Fair   Screenings: AIMS     Office Visit from 09/20/2017 in [place] Office Visit from 06/29/2017 in [place] Office Visit from 03/24/2017 in [place] Admission (Discharged) from 03/03/2017 in [facility]  AIMS Total Score  0  6  10  0    AUDIT     Admission (Discharged) from 03/03/2017 in [place] [facility]  Alcohol Use Disorder Identification Test Final Score (AUDIT)  0    GAD-7     Office Visit from 08/22/2015 in [place] at [facility]  Total GAD-7 Score  6    PHQ2-9     Office Visit from 03/23/2016 in [place] Office Visit from 08/22/2015 in [place] at [place] from 08/06/2015 in [place] Office Visit from 06/10/2015 in [place] from 05/19/2015 in [place]  PHQ-2 Total Score  0  1  0  0  0       Assessment and Plan: Thuy is a 57 year old Caucasian female with history of depression, psychosis, PTSD, diabetes mellitus, drug-induced Parkinsonian some, memory problems, presented to the clinic today for a follow-up visit.  Patient failed or was unable to afford several antipsychotics/mood stabilizers prescribed to her.  Patient however started Zyprexa a month ago and is currently doing well.  Her aims today was 0.  She does not have any tremors or  rigidity or stiffness on the current medication.  Discussed plan as noted below.  Plan Schizoaffective disorder Continue Zyprexa 5 mg p.o. nightly  For PTSD/anxiety symptoms She reports Zyprexa as helpful with her mood.  Memory problems Patient will continue to work with neurology.  Drug-induced Parkinsonism Aims equals 0 today.  Patient has significant improvement on the current medication regimen.  Insomnia Discontinue mirtazapine since she is currently on Zyprexa which helps with sleep.  Also she has concerns of weight gain and increased appetite which can be exacerbated by the combination of mirtazapine and Zyprexa.  Patient to call writer back if she has sleep issues.  Discussed with patient to improve exercise and also to watch her diet.  Follow-up in clinic in 1 month.  More than 50 % of the time was spent for psychoeducation and supportive psychotherapy and care coordination.  This note was generated in part or whole with voice recognition software. Voice recognition is usually quite accurate but there are transcription errors that can and very often do occur. I apologize for any typographical errors that were not detected and corrected.        Ursula Alert, MD 09/20/2017, 12:13 PM

## 2017-09-20 NOTE — Patient Instructions (Addendum)
PLEASE STOP TAKING MIRTAZAPINE DUE TO WEIGHT GAIN AND INCREASED APPETITE CONCERN.  IF YOU CANNOT SLEEP , PLEASE CALL ME BACK.

## 2017-09-23 DIAGNOSIS — T50905A Adverse effect of unspecified drugs, medicaments and biological substances, initial encounter: Secondary | ICD-10-CM | POA: Diagnosis not present

## 2017-09-23 DIAGNOSIS — R635 Abnormal weight gain: Secondary | ICD-10-CM | POA: Diagnosis not present

## 2017-09-23 DIAGNOSIS — J4540 Moderate persistent asthma, uncomplicated: Secondary | ICD-10-CM | POA: Insufficient documentation

## 2017-09-23 DIAGNOSIS — N182 Chronic kidney disease, stage 2 (mild): Secondary | ICD-10-CM | POA: Diagnosis not present

## 2017-09-23 DIAGNOSIS — E1122 Type 2 diabetes mellitus with diabetic chronic kidney disease: Secondary | ICD-10-CM | POA: Diagnosis not present

## 2017-09-29 DIAGNOSIS — Z6841 Body Mass Index (BMI) 40.0 and over, adult: Secondary | ICD-10-CM | POA: Diagnosis not present

## 2017-09-29 DIAGNOSIS — E1122 Type 2 diabetes mellitus with diabetic chronic kidney disease: Secondary | ICD-10-CM | POA: Diagnosis not present

## 2017-09-29 DIAGNOSIS — N182 Chronic kidney disease, stage 2 (mild): Secondary | ICD-10-CM | POA: Diagnosis not present

## 2017-09-29 DIAGNOSIS — R0609 Other forms of dyspnea: Secondary | ICD-10-CM | POA: Diagnosis not present

## 2017-09-30 ENCOUNTER — Other Ambulatory Visit: Payer: Self-pay | Admitting: Psychiatry

## 2017-10-03 DIAGNOSIS — E119 Type 2 diabetes mellitus without complications: Secondary | ICD-10-CM | POA: Diagnosis not present

## 2017-10-03 DIAGNOSIS — E538 Deficiency of other specified B group vitamins: Secondary | ICD-10-CM | POA: Diagnosis not present

## 2017-10-03 DIAGNOSIS — R002 Palpitations: Secondary | ICD-10-CM | POA: Insufficient documentation

## 2017-10-03 DIAGNOSIS — R079 Chest pain, unspecified: Secondary | ICD-10-CM | POA: Diagnosis not present

## 2017-10-03 DIAGNOSIS — I10 Essential (primary) hypertension: Secondary | ICD-10-CM | POA: Diagnosis not present

## 2017-10-04 DIAGNOSIS — M25562 Pain in left knee: Secondary | ICD-10-CM | POA: Diagnosis not present

## 2017-10-04 DIAGNOSIS — M79605 Pain in left leg: Secondary | ICD-10-CM | POA: Diagnosis not present

## 2017-10-04 DIAGNOSIS — G5602 Carpal tunnel syndrome, left upper limb: Secondary | ICD-10-CM | POA: Diagnosis not present

## 2017-10-04 DIAGNOSIS — G8929 Other chronic pain: Secondary | ICD-10-CM | POA: Diagnosis not present

## 2017-10-04 DIAGNOSIS — M24542 Contracture, left hand: Secondary | ICD-10-CM | POA: Diagnosis not present

## 2017-10-04 DIAGNOSIS — M533 Sacrococcygeal disorders, not elsewhere classified: Secondary | ICD-10-CM | POA: Diagnosis not present

## 2017-10-04 DIAGNOSIS — M7918 Myalgia, other site: Secondary | ICD-10-CM | POA: Diagnosis not present

## 2017-10-06 ENCOUNTER — Telehealth: Payer: Self-pay

## 2017-10-06 NOTE — Telephone Encounter (Signed)
pt called left message that she can not take the medication you gave her it is giving her a headach, her hair falling out and she gaing weight is there something else she can try.     Below is a copy of your last note. :  Assessment and Plan: Leylah is a 57 year old Caucasian female with history of depression, psychosis, PTSD, diabetes mellitus, drug-induced Parkinsonian some, memory problems, presented to the clinic today for a follow-up visit.  Patient failed or was unable to afford several antipsychotics/mood stabilizers prescribed to her.  Patient however started Zyprexa a month ago and is currently doing well.  Her aims today was 0.  She does not have any tremors or rigidity or stiffness on the current medication.  Discussed plan as noted below.  Plan Schizoaffective disorder Continue Zyprexa 5 mg p.o. nightly  For PTSD/anxiety symptoms She reports Zyprexa as helpful with her mood.  Memory problems Patient will continue to work with neurology.  Drug-induced Parkinsonism Aims equals 0 today.  Patient has significant improvement on the current medication regimen.  Insomnia Discontinue mirtazapine since she is currently on Zyprexa which helps with sleep.  Also she has concerns of weight gain and increased appetite which can be exacerbated by the combination of mirtazapine and Zyprexa.  Patient to call writer back if she has sleep issues.  Discussed with patient to improve exercise and also to watch her diet.  Follow-up in clinic in 1 month.  More than 50 % of the time was spent for psychoeducation and supportive psychotherapy and care coordination.  This note was generated in part or whole with voice recognition software. Voice recognition is usually quite accurate but there are transcription errors that can and very often do occur. I apologize for any typographical errors that were not detected and corrected.

## 2017-10-06 NOTE — Telephone Encounter (Signed)
pls schedule an appointment to be seen soon.

## 2017-10-18 DIAGNOSIS — R079 Chest pain, unspecified: Secondary | ICD-10-CM | POA: Diagnosis not present

## 2017-10-20 ENCOUNTER — Ambulatory Visit: Payer: Medicare HMO | Admitting: Psychiatry

## 2017-10-20 ENCOUNTER — Other Ambulatory Visit: Payer: Self-pay

## 2017-10-20 ENCOUNTER — Encounter: Payer: Self-pay | Admitting: Psychiatry

## 2017-10-20 VITALS — BP 125/82 | HR 108 | Temp 98.0°F | Wt 257.6 lb

## 2017-10-20 DIAGNOSIS — F4310 Post-traumatic stress disorder, unspecified: Secondary | ICD-10-CM

## 2017-10-20 DIAGNOSIS — G2119 Other drug induced secondary parkinsonism: Secondary | ICD-10-CM | POA: Diagnosis not present

## 2017-10-20 DIAGNOSIS — F251 Schizoaffective disorder, depressive type: Secondary | ICD-10-CM | POA: Diagnosis not present

## 2017-10-20 DIAGNOSIS — F411 Generalized anxiety disorder: Secondary | ICD-10-CM | POA: Diagnosis not present

## 2017-10-20 MED ORDER — BREXPIPRAZOLE 1 MG PO TABS: 1.0000 mg | ORAL_TABLET | Freq: Every day | ORAL | 2 refills | Status: DC

## 2017-10-20 NOTE — Progress Notes (Signed)
[place] MD OP Progress Note  10/20/2017 12:34 PM Heather Haley  MRN:  [id number]  Chief Complaint: ' I am here for follow up." Chief Complaint    Follow-up; Medication Problem; Weight Gain     HPI: Heather Haley is a 57 year old Caucasian female, divorced, lives in [place] , has a history of depression, psychosis, drug-induced Parkinson's disease, memory problems, history of trauma, migraine headaches, diabetes mellitus, presented to the clinic today for a follow-up visit.  Patient today reports she would like to try a different medication other than the Zyprexa.  She reports she is concerned about increased appetite and weight gain issues.  Discussed with patient that other medication trials caused her to have worsening EPS in the past.  Also some of the other medications she could not afford due to a higher co-pay.  Discussed Rexulti , patient agrees to give it a trial.  She will call me back if she is unable to afford it.  Patient denies any significant mood symptoms.  She denies any perceptual disturbances.  She reports sleep is fair.  Patient denies any suicidality or homicidality. Visit Diagnosis:    ICD-10-CM   1. Schizoaffective disorder, depressive type ([place]) F25.1 Brexpiprazole (REXULTI) 1 MG TABS  2. PTSD (post-traumatic stress disorder) F43.10   3. Drug-induced Parkinson's disease ([place]) G21.19   4. Generalized anxiety disorder F41.1     Past Psychiatric History: Reviewed past psychiatric history from my progress note on 05/25/2017  Past Medical History:  Past Medical History:  Diagnosis Date  . Anxiety   . Chronic kidney disease   . Chronic pain    previously saw Dr. Consuela Haley in pain clinic, then saw pain specialist in [place]  . Depression   . Diabetes mellitus ([place])   . Frequency of urination   . GERD (gastroesophageal reflux disease)   . Headache(784.0)   . High cholesterol   . Hypertension   . IBS (irritable bowel syndrome)   . Left ankle instability   . Left  knee DJD   . Lumbar Degenerative Disc Disease of  10/11/2014  . Neuromuscular disorder ([place])   . Osteoarthritis of hip (Right) 05/05/2015  . Other enthesopathy of ankle and tarsus 12/15/2009   Qualifier: Diagnosis of  By: Heather Alar MD, Heather Haley    . Peripheral sensory neuropathy (Bilateral) 11/19/2014  . Postoperative nausea and vomiting   . Schizophrenia [facility])     Past Surgical History:  Procedure Laterality Date  . ABDOMINAL HYSTERECTOMY    . ANKLE SURGERY    . APPENDECTOMY    . COLONOSCOPY  2013  . COLONOSCOPY WITH PROPOFOL N/A 04/25/2017   Procedure: COLONOSCOPY WITH PROPOFOL;  Surgeon: Heather Silvas, MD;  Location: [facility] END[facility]COPY;  Service: Endoscopy;  Laterality: N/A;  . ESOPHAGOGASTRODUODENOSCOPY (EGD) WITH PROPOFOL N/A 10/03/2014   Procedure: ESOPHAGOGASTRODUODENOSCOPY (EGD) WITH PROPOFOL;  Surgeon: Heather Class, MD;  Location: [facility] ENDOSCOPY;  Service: Endoscopy;  Laterality: N/A;  . ESOPHAGOGASTRODUODENOSCOPY (EGD) WITH PROPOFOL  04/25/2017   Procedure: ESOPHAGOGASTRODUODENOSCOPY (EGD) WITH PROPOFOL;  Surgeon: Heather Silvas, MD;  Location: [facility] ENDOSCOPY;  Service: Endoscopy;;  . KNEE ARTHROSCOPY  1997   left knee  . LEFT HEART CATHETERIZATION WITH CORONARY ANGIOGRAM N/A 01/11/2013   Procedure: LEFT HEART CATHETERIZATION WITH CORONARY ANGIOGRAM;  Surgeon: Heather Grooms, MD;  Location: [facility] CATH LAB;  Service: Cardiovascular;  Laterality: N/A;  . TOTAL KNEE ARTHROPLASTY  08/30/2011   Procedure: TOTAL KNEE ARTHROPLASTY;  Surgeon: Heather Junes, MD;  Location: [facility];  Service: Orthopedics;  Laterality: Left;    Family Psychiatric History: I have reviewed family psychiatric history from my progress note on 05/25/2017  Family History:  Family History  Problem Relation Age of Onset  . Cancer Mother   . Hypertension Father   . Heart disease Father   . Alcohol abuse Father     Social History: I have reviewed social history from my progress note on 05/25/2017 Social  History   Socioeconomic History  . Marital status: Divorced    Spouse name: Not on file  . Number of children: 2  . Years of education: 68  . Highest education level: 11th grade  Occupational History    Employer: [employer]  . Financial resource strain: Somewhat hard  . Food insecurity:    Worry: Never true    Inability: Never true  . Transportation needs:    Medical: No    Non-medical: No  Tobacco Use  . Smoking status: Former Smoker    Packs/day: 2.00    Years: 15.00    Pack years: 30.00    Types: Cigarettes    Last attempt to quit: 02/09/1995    Years since quitting: 22.7  . Smokeless tobacco: Never Used  Substance and Sexual Activity  . Alcohol use: No    Alcohol/week: 0.0 standard drinks  . Drug use: No  . Sexual activity: Not Currently  Lifestyle  . Physical activity:    Days per week: 0 days    Minutes per session: 0 min  . Stress: Rather much  Relationships  . Social connections:    Talks on phone: More than three times a week    Gets together: Once a week    Attends religious service: Never    Active member of club or organization: No    Attends meetings of clubs or organizations: Never    Relationship status: Divorced  Other Topics Concern  . Not on file  Social History Narrative   Patient lives at home alone. Patient works at [employer].   Caffeine daily- 2   Right handed.   Education- 11 th grade    Allergies:  Allergies  Allergen Reactions  . Prolixin Decanoate [Fluphenazine]   . Benztropine Other (See Comments)    Hair fall out  Suicide thoughts  . Buprenorphine Hcl Other (See Comments)    Unable to void  . Codeine Hives    REACTION: hives  . Cymbalta [Duloxetine Hcl] Other (See Comments)    Altered mental status  . Duloxetine Other (See Comments)    Altered mental status Alopecia, visual hallucinations, nightmares  . Gabapentin Swelling  . Lyrica [Pregabalin] Other (See Comments)    Alopecia, visual  hallucinations, nightmares Altered mental status Alopecia, visual hallucinations, nightmares Altered mental status  . Morphine And Related Other (See Comments)    Unable to void  . Nortripytline Hcl [Nortriptyline] Other (See Comments)    Hair loss and night mares Hair loss and night mares  . Nsaids     REACTION: palpitations, diaphoresis  . Penicillin G Nausea And Vomiting  . Penicillins Other (See Comments)    REACTION: upset stomach Has patient had a PCN reaction causing immediate rash, facial/tongue/throat swelling, SOB or lightheadedness with hypotension: No Has patient had a PCN reaction causing severe rash involving mucus membranes or skin necrosis: No Has patient had a PCN reaction that required hospitalization: No Has patient had a PCN reaction occurring within the last 10 years: No If all of  the above answers are "NO", then may proceed with Cephalosporin use.  . Tolmetin Other (See Comments)    REACTION: palpitations, diaphoresis  . Wellbutrin [Bupropion]     Constipation, mood swings    Metabolic Disorder Labs: Lab Results  Component Value Date   HGBA1C 6.2 (H) 03/06/2017   MPG 131.24 03/06/2017   MPG 108 05/17/2015   Lab Results  Component Value Date   PROLACTIN 64.2 (H) 03/06/2017   Lab Results  Component Value Date   CHOL 175 03/06/2017   TRIG 234 (H) 03/06/2017   HDL 69 03/06/2017   CHOLHDL 2.5 03/06/2017   VLDL 47 (H) 03/06/2017   LDLCALC 59 03/06/2017   LDLCALC 93 05/17/2015   Lab Results  Component Value Date   TSH 2.766 08/24/2017   TSH 1.289 03/06/2017    Therapeutic Level Labs: No results found for: LITHIUM No results found for: VALPROATE No components found for:  CBMZ  Current Medications: Current Outpatient Medications  Medication Sig Dispense Refill  . acetaminophen (TYLENOL) 500 MG tablet Take 1 tablet (500 mg total) by mouth every 6 (six) hours as needed for mild pain. (Patient taking differently: Take 500 mg by mouth daily as  needed for mild pain. ) 30 tablet 0  . albuterol (PROVENTIL HFA;VENTOLIN HFA) 108 (90 Base) MCG/ACT inhaler Inhale 2 puffs into the lungs every 6 (six) hours as needed for wheezing or shortness of breath.    Marland Kitchen aspirin 81 MG chewable tablet Chew 81 mg by mouth daily.    . cetirizine (ZYRTEC) 10 MG tablet Take 10 mg by mouth daily.    . cholecalciferol (VITAMIN D) 1000 units tablet Take 1,000 Units by mouth daily.    . cyclobenzaprine (FLEXERIL) 10 MG tablet Take 10 mg by mouth 3 (three) times daily as needed for muscle spasms.     Marland Kitchen docusate sodium (COLACE) 100 MG capsule Take 100 mg by mouth daily.     . fluticasone (FLOVENT HFA) 110 MCG/ACT inhaler Inhale 1 puff into the lungs 2 (two) times daily as needed (shortness of breath).    . furosemide (LASIX) 20 MG tablet Take 20 mg by mouth daily.     Marland Kitchen HYDROcodone-acetaminophen (NORCO/VICODIN) 5-325 MG tablet Take 0.5 tablets by mouth 2 (two) times daily. Every afternoon and at bedtime    . hydroxypropyl methylcellulose / hypromellose (ISOPTO TEARS / GONIOVISC) 2.5 % ophthalmic solution Place 1 drop into both eyes every 4 (four) hours as needed for dry eyes.    . metFORMIN (GLUCOPHAGE) 500 MG tablet Take 1 tablet (500 mg total) by mouth 2 (two) times daily with a meal. For diabetes management (Patient taking differently: Take 500 mg by mouth at bedtime. ) 10 tablet 0  . methocarbamol (ROBAXIN) 500 MG tablet Take 500 mg by mouth daily as needed for muscle spasms.     Marland Kitchen omeprazole (PRILOSEC) 40 MG capsule Take 40 mg by mouth daily.    . ondansetron (ZOFRAN) 4 MG tablet Take 4 mg by mouth every 8 (eight) hours as needed for nausea or vomiting.    . pravastatin (PRAVACHOL) 40 MG tablet Take 40 mg by mouth at bedtime.     . ranitidine (ZANTAC) 150 MG tablet Take 150 mg by mouth 2 (two) times daily as needed for heartburn.     . rizatriptan (MAXALT) 10 MG tablet Take 10 mg by mouth as needed for migraine. May repeat in 2 hours if needed    . senna (SENOKOT)  8.6 MG tablet  Take 1-4 tablets (8.6-34.4 mg total) by mouth daily as needed for constipation. (Patient taking differently: Take 1 tablet by mouth at bedtime. )    . topiramate (TOPAMAX) 100 MG tablet Take 0.5 tablets (50 mg total) by mouth at bedtime. For mood control 30 tablet 1  . traMADol (ULTRAM) 50 MG tablet Take 2 tablets (100 mg total) by mouth every 8 (eight) hours as needed for moderate pain or severe pain. (Patient taking differently: Take 50-100 mg by mouth 3 (three) times daily. 100 mg every morning, 50 mg every afternoon, and 50 mg at bedtime) 180 tablet 2  . TRULICITY 1.61 WR/6.0AV SOPN   5  . verapamil (CALAN-SR) 120 MG CR tablet Take 1 tablet (120 mg total) by mouth at bedtime. For high blood pressure 90 tablet 1  . vitamin B-12 (CYANOCOBALAMIN) 1000 MCG tablet Take 1,000 mcg by mouth daily.    . Brexpiprazole (REXULTI) 1 MG TABS Take 1 tablet (1 mg total) by mouth daily with supper. 30 tablet 2   No current facility-administered medications for this visit.      Musculoskeletal: Strength & Muscle Tone: within normal limits Gait & Station: walsk with cane Patient leans: N/A  Psychiatric Specialty Exam: Review of Systems  Psychiatric/Behavioral: Positive for hallucinations (improved). The patient is nervous/anxious.   All other systems reviewed and are negative.   Blood pressure 125/82, pulse (!) 108, temperature 98 F (36.7 C), temperature source Tympanic, weight 257 lb 9.6 oz (116.8 kg).Body mass index is 42.87 kg/m.  General Appearance: Casual  Eye Contact:  Fair  Speech:  Normal Rate  Volume:  Normal  Mood:  Euthymic  Affect:  Congruent  Thought Process:  Goal Directed and Descriptions of Associations: Intact  Orientation:  Full (Time, Place, and Person)  Thought Content: Logical   Suicidal Thoughts:  No  Homicidal Thoughts:  No  Memory:  Immediate;   Fair Recent;   Fair Remote;   Fair  Judgement:  Good  Insight:  Fair  Psychomotor Activity:  mild tremors of  UE  Concentration:  Concentration: Fair and Attention Span: Fair  Recall:  [employer] of Knowledge: Fair  Language: Fair  Akathisia:  No  Handed:  Right  AIMS (if indicated): denies rigidity, stiffness, has mild tremors of UE  Assets:  Communication Skills Desire for Improvement  ADL's:  Intact  Cognition: WNL  Sleep:  Fair   Screenings: AIMS     Office Visit from 09/20/2017 in [facility] Office Visit from 06/29/2017 in [facility] Visit from 03/24/2017 in [facility] Admission (Discharged) from 03/03/2017 in [facility]  AIMS Total Score  0  6  10  0    AUDIT     Admission (Discharged) from 03/03/2017 in [facility]  Alcohol Use Disorder Identification Test Final Score (AUDIT)  0    GAD-7     Office Visit from 08/22/2015 in [place] at [facility]  Total GAD-7 Score  6    PHQ2-9     Office Visit from 03/23/2016 in [facility] Office Visit from 08/22/2015 in [facility] [place] from 08/06/2015 in [place] Office Visit from 06/10/2015 in [facility] [place] from 05/19/2015 in [facility]  PHQ-2 Total Score  0  1  0  0  0       Assessment and Plan: Jerome is a 57 year old Caucasian female, history of schizoaffective disorder, PTSD, migraine headaches, diabetes mellitus, drug-induced Parkinsonism, but he problems, presented to the clinic today for a follow-up visit.  Patient today reports she is concerned about her olanzapine causing her to gain weight and increase her appetite.  Patient reports she would like to change her medication today.  Will make the following changes.  Plan Schizoaffective disorder Discontinue Zyprexa for  side effects. Add Rexulti 1 mg po daily with supper.  PTSD/anxiety symptoms Rexulti as precribed.  Memory problems Patient will continue to work with neurology.  Drug-induced Parkinsonism Resolving on the current medication regimen.  Insomnia Patient reports insomnia as resolving.  Follow-up in clinic in 4 weeks or sooner if needed  More than 50 % of the time was spent for psychoeducation and supportive psychotherapy and care coordination.   This note was generated in part or whole with voice recognition software. Voice recognition is usually quite accurate but there are transcription errors that can and very often do occur. I apologize for any typographical errors that were not detected and corrected.             Ursula Alert, MD 10/20/2017, 12:34 PM

## 2017-10-24 DIAGNOSIS — N182 Chronic kidney disease, stage 2 (mild): Secondary | ICD-10-CM | POA: Diagnosis not present

## 2017-10-24 DIAGNOSIS — E7800 Pure hypercholesterolemia, unspecified: Secondary | ICD-10-CM | POA: Diagnosis not present

## 2017-10-24 DIAGNOSIS — E119 Type 2 diabetes mellitus without complications: Secondary | ICD-10-CM | POA: Diagnosis not present

## 2017-10-24 DIAGNOSIS — I10 Essential (primary) hypertension: Secondary | ICD-10-CM | POA: Diagnosis not present

## 2017-10-24 DIAGNOSIS — R079 Chest pain, unspecified: Secondary | ICD-10-CM | POA: Diagnosis not present

## 2017-10-24 DIAGNOSIS — Z01818 Encounter for other preprocedural examination: Secondary | ICD-10-CM | POA: Diagnosis not present

## 2017-10-24 DIAGNOSIS — R002 Palpitations: Secondary | ICD-10-CM | POA: Diagnosis not present

## 2017-10-24 NOTE — Telephone Encounter (Signed)
Pt has not on  11-17-17

## 2017-10-27 ENCOUNTER — Telehealth: Payer: Self-pay

## 2017-10-27 NOTE — Telephone Encounter (Signed)
pls let pt know to continue Zyprexa as prescribed for now .

## 2017-10-27 NOTE — Telephone Encounter (Signed)
Heather Haley left a message on my desk from yesterday that pt called and states that [facility] wanted to charge $300 for rexulti .

## 2017-11-01 DIAGNOSIS — G8929 Other chronic pain: Secondary | ICD-10-CM | POA: Diagnosis not present

## 2017-11-01 DIAGNOSIS — Z5181 Encounter for therapeutic drug level monitoring: Secondary | ICD-10-CM | POA: Diagnosis not present

## 2017-11-04 DIAGNOSIS — E538 Deficiency of other specified B group vitamins: Secondary | ICD-10-CM | POA: Diagnosis not present

## 2017-11-10 ENCOUNTER — Ambulatory Visit
Admission: RE | Admit: 2017-11-10 | Discharge: 2017-11-10 | Disposition: A | Discharge status: Home / Self Care | Payer: Medicare HMO | Source: Ambulatory Visit | Attending: Cardiology | Admitting: Cardiology

## 2017-11-10 ENCOUNTER — Encounter
Admission: RE | Disposition: A | Discharge status: Home / Self Care | Payer: Self-pay | Source: Ambulatory Visit | Attending: Cardiology

## 2017-11-10 DIAGNOSIS — E1122 Type 2 diabetes mellitus with diabetic chronic kidney disease: Secondary | ICD-10-CM | POA: Diagnosis not present

## 2017-11-10 DIAGNOSIS — Z90710 Acquired absence of both cervix and uterus: Secondary | ICD-10-CM | POA: Diagnosis not present

## 2017-11-10 DIAGNOSIS — Z79899 Other long term (current) drug therapy: Secondary | ICD-10-CM | POA: Diagnosis not present

## 2017-11-10 DIAGNOSIS — R9430 Abnormal result of cardiovascular function study, unspecified: Secondary | ICD-10-CM

## 2017-11-10 DIAGNOSIS — Z86010 Personal history of colonic polyps: Secondary | ICD-10-CM | POA: Insufficient documentation

## 2017-11-10 DIAGNOSIS — Z8619 Personal history of other infectious and parasitic diseases: Secondary | ICD-10-CM | POA: Insufficient documentation

## 2017-11-10 DIAGNOSIS — E785 Hyperlipidemia, unspecified: Secondary | ICD-10-CM | POA: Insufficient documentation

## 2017-11-10 DIAGNOSIS — Z8249 Family history of ischemic heart disease and other diseases of the circulatory system: Secondary | ICD-10-CM | POA: Diagnosis not present

## 2017-11-10 DIAGNOSIS — Z885 Allergy status to narcotic agent status: Secondary | ICD-10-CM | POA: Diagnosis not present

## 2017-11-10 DIAGNOSIS — Z96652 Presence of left artificial knee joint: Secondary | ICD-10-CM | POA: Insufficient documentation

## 2017-11-10 DIAGNOSIS — K219 Gastro-esophageal reflux disease without esophagitis: Secondary | ICD-10-CM | POA: Diagnosis not present

## 2017-11-10 DIAGNOSIS — Z7984 Long term (current) use of oral hypoglycemic drugs: Secondary | ICD-10-CM | POA: Insufficient documentation

## 2017-11-10 DIAGNOSIS — Z85828 Personal history of other malignant neoplasm of skin: Secondary | ICD-10-CM | POA: Diagnosis not present

## 2017-11-10 DIAGNOSIS — R002 Palpitations: Secondary | ICD-10-CM | POA: Diagnosis not present

## 2017-11-10 DIAGNOSIS — Z87891 Personal history of nicotine dependence: Secondary | ICD-10-CM | POA: Diagnosis not present

## 2017-11-10 DIAGNOSIS — Z7982 Long term (current) use of aspirin: Secondary | ICD-10-CM | POA: Insufficient documentation

## 2017-11-10 DIAGNOSIS — K589 Irritable bowel syndrome without diarrhea: Secondary | ICD-10-CM | POA: Diagnosis not present

## 2017-11-10 DIAGNOSIS — Z8371 Family history of colonic polyps: Secondary | ICD-10-CM | POA: Insufficient documentation

## 2017-11-10 DIAGNOSIS — N182 Chronic kidney disease, stage 2 (mild): Secondary | ICD-10-CM | POA: Insufficient documentation

## 2017-11-10 DIAGNOSIS — Z880 Allergy status to penicillin: Secondary | ICD-10-CM | POA: Insufficient documentation

## 2017-11-10 DIAGNOSIS — I25119 Atherosclerotic heart disease of native coronary artery with unspecified angina pectoris: Secondary | ICD-10-CM | POA: Insufficient documentation

## 2017-11-10 DIAGNOSIS — R6889 Other general symptoms and signs: Secondary | ICD-10-CM | POA: Diagnosis not present

## 2017-11-10 DIAGNOSIS — Z888 Allergy status to other drugs, medicaments and biological substances status: Secondary | ICD-10-CM | POA: Diagnosis not present

## 2017-11-10 DIAGNOSIS — Z809 Family history of malignant neoplasm, unspecified: Secondary | ICD-10-CM | POA: Insufficient documentation

## 2017-11-10 DIAGNOSIS — I129 Hypertensive chronic kidney disease with stage 1 through stage 4 chronic kidney disease, or unspecified chronic kidney disease: Secondary | ICD-10-CM | POA: Diagnosis not present

## 2017-11-10 DIAGNOSIS — R079 Chest pain, unspecified: Secondary | ICD-10-CM | POA: Diagnosis not present

## 2017-11-10 DIAGNOSIS — Z9889 Other specified postprocedural states: Secondary | ICD-10-CM | POA: Insufficient documentation

## 2017-11-10 DIAGNOSIS — Z886 Allergy status to analgesic agent status: Secondary | ICD-10-CM | POA: Diagnosis not present

## 2017-11-10 DIAGNOSIS — Z800 Family history of malignant neoplasm of digestive organs: Secondary | ICD-10-CM | POA: Insufficient documentation

## 2017-11-10 HISTORY — DX: Parkinson's disease: G20

## 2017-11-10 HISTORY — DX: Parkinson's disease without dyskinesia, without mention of fluctuations: G20.A1

## 2017-11-10 HISTORY — PX: LEFT HEART CATH AND CORONARY ANGIOGRAPHY: CATH118249

## 2017-11-10 LAB — GLUCOSE, CAPILLARY
Glucose-Capillary: 151 mg/dL — ABNORMAL HIGH (ref 70–99)
Glucose-Capillary: 121 mg/dL — ABNORMAL HIGH (ref 70–99)

## 2017-11-10 SURGERY — LEFT HEART CATH AND CORONARY ANGIOGRAPHY
Anesthesia: Moderate Sedation | Laterality: Left

## 2017-11-10 MED ORDER — SODIUM CHLORIDE 0.9 % WEIGHT BASED INFUSION
3.0000 mL/kg/h | INTRAVENOUS | Status: AC
  Administered 2017-11-10: 3 mL/kg/h via INTRAVENOUS

## 2017-11-10 MED ORDER — SODIUM CHLORIDE 0.9% FLUSH: 3.0000 mL | Freq: Two times a day (BID) | INTRAVENOUS | Status: DC

## 2017-11-10 MED ORDER — ASPIRIN 81 MG PO CHEW
CHEWABLE_TABLET | ORAL | Status: AC
  Administered 2017-11-10: 81 mg via ORAL
  Filled 2017-11-10: qty 1

## 2017-11-10 MED ORDER — SODIUM CHLORIDE 0.9 % IV SOLN: 250.0000 mL | INTRAVENOUS | Status: DC | PRN

## 2017-11-10 MED ORDER — MIDAZOLAM HCL 2 MG/2ML IJ SOLN
INTRAMUSCULAR | Status: AC
  Filled 2017-11-10: qty 2

## 2017-11-10 MED ORDER — MIDAZOLAM HCL 2 MG/2ML IJ SOLN
INTRAMUSCULAR | Status: DC | PRN
  Administered 2017-11-10: 1 mg via INTRAVENOUS

## 2017-11-10 MED ORDER — VERAPAMIL HCL 2.5 MG/ML IV SOLN
INTRAVENOUS | Status: AC
  Filled 2017-11-10: qty 2

## 2017-11-10 MED ORDER — SODIUM CHLORIDE 0.9% FLUSH: 3.0000 mL | INTRAVENOUS | Status: DC | PRN

## 2017-11-10 MED ORDER — HEPARIN SODIUM (PORCINE) 1000 UNIT/ML IJ SOLN
INTRAMUSCULAR | Status: AC
  Filled 2017-11-10: qty 1

## 2017-11-10 MED ORDER — ASPIRIN 81 MG PO CHEW
81.0000 mg | CHEWABLE_TABLET | ORAL | Status: AC
  Administered 2017-11-10: 81 mg via ORAL

## 2017-11-10 MED ORDER — HEPARIN SODIUM (PORCINE) 1000 UNIT/ML IJ SOLN
INTRAMUSCULAR | Status: DC | PRN
  Administered 2017-11-10: 5500 [IU] via INTRAVENOUS

## 2017-11-10 MED ORDER — VERAPAMIL HCL 2.5 MG/ML IV SOLN
INTRAVENOUS | Status: DC | PRN
  Administered 2017-11-10: 2.5 mg via INTRA_ARTERIAL

## 2017-11-10 MED ORDER — SODIUM CHLORIDE 0.9 % WEIGHT BASED INFUSION: 1.0000 mL/kg/h | INTRAVENOUS | Status: DC

## 2017-11-10 MED ORDER — HEPARIN (PORCINE) IN NACL 1000-0.9 UT/500ML-% IV SOLN
INTRAVENOUS | Status: AC
  Filled 2017-11-10: qty 1000

## 2017-11-10 MED ORDER — FENTANYL CITRATE (PF) 100 MCG/2ML IJ SOLN
INTRAMUSCULAR | Status: AC
  Filled 2017-11-10: qty 2

## 2017-11-10 MED ORDER — FENTANYL CITRATE (PF) 100 MCG/2ML IJ SOLN
INTRAMUSCULAR | Status: DC | PRN
  Administered 2017-11-10: 25 ug via INTRAVENOUS

## 2017-11-10 SURGICAL SUPPLY — 9 items
CATH INFINITI 5 FR JL3.5 (CATHETERS) ×1 IMPLANT
CATH INFINITI 5FR ANG PIGTAIL (CATHETERS) ×1 IMPLANT
CATH INFINITI 5FR JL4 (CATHETERS) ×1 IMPLANT
CATH INFINITI JR4 5F (CATHETERS) ×1 IMPLANT
DEVICE RAD TR BAND REGULAR (VASCULAR PRODUCTS) ×1 IMPLANT
GLIDESHEATH SLEND SS 6F .021 (SHEATH) ×1 IMPLANT
KIT MANI 3VAL PERCEP (MISCELLANEOUS) ×2 IMPLANT
PACK CARDIAC CATH (CUSTOM PROCEDURE TRAY) ×2 IMPLANT
WIRE ROSEN-J .035X260CM (WIRE) ×1 IMPLANT

## 2017-11-17 ENCOUNTER — Ambulatory Visit: Payer: Medicare HMO | Admitting: Psychiatry

## 2017-11-18 DIAGNOSIS — R079 Chest pain, unspecified: Secondary | ICD-10-CM | POA: Diagnosis not present

## 2017-11-18 DIAGNOSIS — Z9889 Other specified postprocedural states: Secondary | ICD-10-CM | POA: Insufficient documentation

## 2017-11-18 DIAGNOSIS — I10 Essential (primary) hypertension: Secondary | ICD-10-CM | POA: Diagnosis not present

## 2017-11-18 DIAGNOSIS — E119 Type 2 diabetes mellitus without complications: Secondary | ICD-10-CM | POA: Diagnosis not present

## 2017-11-18 DIAGNOSIS — R002 Palpitations: Secondary | ICD-10-CM | POA: Diagnosis not present

## 2017-11-22 DIAGNOSIS — M79605 Pain in left leg: Secondary | ICD-10-CM | POA: Diagnosis not present

## 2017-11-22 DIAGNOSIS — G5602 Carpal tunnel syndrome, left upper limb: Secondary | ICD-10-CM | POA: Diagnosis not present

## 2017-11-22 DIAGNOSIS — M25562 Pain in left knee: Secondary | ICD-10-CM | POA: Diagnosis not present

## 2017-11-22 DIAGNOSIS — G8929 Other chronic pain: Secondary | ICD-10-CM | POA: Diagnosis not present

## 2017-11-22 DIAGNOSIS — M24542 Contracture, left hand: Secondary | ICD-10-CM | POA: Diagnosis not present

## 2017-11-22 DIAGNOSIS — M533 Sacrococcygeal disorders, not elsewhere classified: Secondary | ICD-10-CM | POA: Diagnosis not present

## 2017-11-22 DIAGNOSIS — M7918 Myalgia, other site: Secondary | ICD-10-CM | POA: Diagnosis not present

## 2017-11-25 DIAGNOSIS — M2170 Unequal limb length (acquired), unspecified site: Secondary | ICD-10-CM | POA: Diagnosis not present

## 2017-11-25 DIAGNOSIS — M549 Dorsalgia, unspecified: Secondary | ICD-10-CM | POA: Diagnosis not present

## 2017-11-30 DIAGNOSIS — G2119 Other drug induced secondary parkinsonism: Secondary | ICD-10-CM | POA: Diagnosis not present

## 2017-11-30 DIAGNOSIS — R413 Other amnesia: Secondary | ICD-10-CM | POA: Diagnosis not present

## 2017-11-30 DIAGNOSIS — M5481 Occipital neuralgia: Secondary | ICD-10-CM | POA: Diagnosis not present

## 2017-11-30 DIAGNOSIS — G43719 Chronic migraine without aura, intractable, without status migrainosus: Secondary | ICD-10-CM | POA: Diagnosis not present

## 2017-12-01 ENCOUNTER — Encounter: Payer: Self-pay | Admitting: Psychiatry

## 2017-12-01 ENCOUNTER — Other Ambulatory Visit: Payer: Self-pay

## 2017-12-01 ENCOUNTER — Ambulatory Visit: Payer: Medicare HMO | Admitting: Psychiatry

## 2017-12-01 VITALS — BP 125/85 | HR 93 | Temp 97.6°F | Wt 254.2 lb

## 2017-12-01 DIAGNOSIS — F411 Generalized anxiety disorder: Secondary | ICD-10-CM | POA: Diagnosis not present

## 2017-12-01 DIAGNOSIS — G2119 Other drug induced secondary parkinsonism: Secondary | ICD-10-CM | POA: Diagnosis not present

## 2017-12-01 DIAGNOSIS — F251 Schizoaffective disorder, depressive type: Secondary | ICD-10-CM

## 2017-12-01 DIAGNOSIS — F4310 Post-traumatic stress disorder, unspecified: Secondary | ICD-10-CM

## 2017-12-01 MED ORDER — CARIPRAZINE HCL 1.5 MG PO CAPS: 1.5000 mg | ORAL_CAPSULE | Freq: Every day | ORAL | 1 refills | Status: DC

## 2017-12-01 NOTE — Patient Instructions (Signed)
Cariprazine oral capsules What is this medicine? CARIPRAZINE (car i PRA zeen) is an antipsychotic. It is used to treat schizophrenia or bipolar disorder. Bipolar disorder is also known as manic-depression. This medicine may be used for other purposes; ask your health care provider or pharmacist if you have questions. COMMON BRAND NAME(S): VRAYLAR What should I tell my health care provider before I take this medicine? They need to know if you have any of these conditions: -dementia -diabetes -difficulty swallowing -heart disease -history of breast cancer -kidney disease -liver disease -low blood counts, like low white cell, platelet, or red cell counts -low blood pressure -Parkinson's disease -seizures -suicidal thoughts, plans, or attempt; a previous suicide attempt by you or a family member -an unusual or allergic reaction to cariprazine, other medicines, foods, dyes, or preservatives -pregnant or trying to get pregnant -breast-feeding How should I use this medicine? Take this medicine by mouth with a glass of water. Follow the directions on the prescription label. You may take it with or without food. Take your medicine at regular intervals. Do not take it more often than directed. Do not stop taking except on your doctor's advice. Talk to your pediatrician regarding the use of this medicine in children. Special care may be needed. Overdosage: If you think you have taken too much of this medicine contact a poison control center or emergency room at once. NOTE: This medicine is only for you. Do not share this medicine with others. What if I miss a dose? If you miss a dose, take it as soon as you can. If it is almost time for your next dose, take only that dose. Do not take double or extra doses. What may interact with this medicine? Do not take this medicine with any of the following medications: -metoclopramide This medicine may also interact with the following  medications: -carbamazepine -certain medicines for depression, anxiety, or psychotic disturbances -certain medicines for sleep -itraconazole -ketoconazole -medicines for blood pressure -rifampin -St John's wort This list may not describe all possible interactions. Give your health care provider a list of all the medicines, herbs, non-prescription drugs, or dietary supplements you use. Also tell them if you smoke, drink alcohol, or use illegal drugs. Some items may interact with your medicine. What should I watch for while using this medicine? Visit your doctor or health care professional for regular checks on your progress. It may be several weeks before you see the full effects of this medicine. Notify your doctor or health care professional if your symptoms get worse, if you have new symptoms, if you are having an unusual effect from this medicine, or if you feel out of control, very discouraged or think you might harm yourself or others. Do not suddenly stop taking this medicine. You may need to gradually reduce the dose. Ask your doctor or health care professional for advice. You may get dizzy or drowsy. Do not drive, use machinery, or do anything that needs mental alertness until you know how this medicine affects you. Do not stand or sit up quickly, especially if you are an older patient. This reduces the risk of dizzy or fainting spells. Alcohol can increase dizziness and drowsiness. Avoid alcoholic drinks. This medicine may cause dry eyes and blurred vision. If you wear contact lenses you may feel some discomfort. Lubricating drops may help. See your eye doctor if the problem does not go away or is severe. This medicine can reduce the response of your body to heat or cold. Dress   warm in cold weather and stay hydrated in hot weather. If possible, avoid extreme temperatures like saunas, hot tubs, very hot or cold showers, or activities that can cause dehydration such as vigorous exercise. Women  should inform their doctor if they wish to become pregnant or think they might be pregnant. The effects of this medicine on an unborn child are not known. A registry is available to monitor pregnancy outcomes in pregnant women exposed to this medicine or similar medicines. Talk to your health care professional or pharmacist for more information. What side effects may I notice from receiving this medicine? Side effects that you should report to your doctor or health care professional as soon as possible: -allergic reactions like skin rash, itching or hives, swelling of the face, lips, or tongue -changes in emotions or moods -confusion -difficulty swallowing -feeling faint or lightheaded, falls -fever or chills, sore throat -inability to control muscle movements in the face, mouth, hands, arms, or legs -increased hunger or thirst -increased urination -missed or irregular menstrual periods -problems with balance, talking, walking -redness, blistering, peeling or loosening of the skin, including inside the mouth -seizures -stiff muscles -suicidal thoughts or other mood changes -unusually weak or tired Side effects that usually do not require medical attention (report to your doctor or health care professional if they continue or are bothersome): -constipation -dizziness -drowsiness -nausea, vomiting -restlessness -upset stomach -weight gain This list may not describe all possible side effects. Call your doctor for medical advice about side effects. You may report side effects to FDA at 1-800-FDA-1088. Where should I keep my medicine? Keep out of the reach of children. Store at room temperature between 15 and 30 degrees C (59 and 86 degrees F). Protect from light. Throw away any unused medicine after the expiration date. NOTE: This sheet is a summary. It may not cover all possible information. If you have questions about this medicine, talk to your doctor, pharmacist, or health care  provider.  2018 Elsevier/Gold Standard (2015-12-26 15:40:17)

## 2017-12-01 NOTE — Progress Notes (Signed)
[facility] MD OP Progress Note  12/01/2017 5:11 PM Heather Haley  MRN:  [id number]  Chief Complaint: ' I am here for follow up." Chief Complaint    Follow-up; Medication Refill     HPI: Heather Haley is a 57 year old Caucasian female, divorced, lives in [place] , a history of depression, psychosis, drug-induced Parkinson's disease, memory problems, history of trauma, migraine headaches, diabetes mellitus, presented to the clinic today for a follow-up visit.  Patient today reports she is doing okay on the Zyprexa with regards to her mood symptoms and perceptual disturbances.  She denies any psychosis at this time.  She denies any  anxiety or depressive symptoms.  Reports that even though the Zyprexa is helpful she does not want to continue on this medication since it is causing her to gain a lot of weight.  Patient reports she wants to change her medication to another mood stabilizer.  She denies any sleep problems.  Patient recently had cardiology workup and was cleared per cardiology.  She reports all her workup came back okay.  She reports she recently had an appointment with neurologist who made some changes with her medications.  She was started on Requip for her parkinsonian symptoms.  She reports she has not started taking it yet even though it was prescribed to her.  She reports she is going to start that soon.  Patient denies any suicidality or homicidality.  Patient denies any other concerns today Visit Diagnosis:    ICD-10-CM   1. Schizoaffective disorder, depressive type ([place]) F25.1   2. PTSD (post-traumatic stress disorder) F43.10   3. Generalized anxiety disorder F41.1   4. Drug-induced Parkinson's disease ([place]) G21.19     Past Psychiatric History: I have reviewed past psychiatric history from my progress note on 05/25/2017  Past Medical History:  Past Medical History:  Diagnosis Date  . Anxiety   . Chronic kidney disease   . Chronic pain    previously saw Dr. Consuela Mimes in  pain clinic, then saw pain specialist in [place]  . Depression   . Diabetes mellitus ([place])   . Frequency of urination   . GERD (gastroesophageal reflux disease)   . Headache(784.0)   . High cholesterol   . Hypertension   . IBS (irritable bowel syndrome)   . Left ankle instability   . Left knee DJD   . Lumbar Degenerative Disc Disease of  10/11/2014  . Neuromuscular disorder ([place])   . Osteoarthritis of hip (Right) 05/05/2015  . Other enthesopathy of ankle and tarsus 12/15/2009   Qualifier: Diagnosis of  By: Oneida Alar MD, KARL    . Parkinson's disease ([place])   . Peripheral sensory neuropathy (Bilateral) 11/19/2014  . Postoperative nausea and vomiting   . Schizophrenia [facility])     Past Surgical History:  Procedure Laterality Date  . ABDOMINAL HYSTERECTOMY    . ANKLE SURGERY    . APPENDECTOMY    . COLONOSCOPY  2013  . COLONOSCOPY WITH PROPOFOL N/A 04/25/2017   Procedure: COLONOSCOPY WITH PROPOFOL;  Surgeon: Manya Silvas, MD;  Location: [facility] ENDOSCOPY;  Service: Endoscopy;  Laterality: N/A;  . ESOPHAGOGASTRODUODENOSCOPY (EGD) WITH PROPOFOL N/A 10/03/2014   Procedure: ESOPHAGOGASTRODUODENOSCOPY (EGD) WITH PROPOFOL;  Surgeon: Josefine Class, MD;  Location[facility] ENDOSCOPY;  Service: Endoscopy;  Laterality: N/A;  . ESOPHAGOGASTRODUODENOSCOPY (EGD) WITH PROPOFOL  04/25/2017   Procedure: ESOPHAGOGASTRODUODENOSCOPY (EGD) WITH PROPOFOL;  Surgeon: Manya Silvas, MD;  Location: [facility] ENDOSCOPY;  Service: Endoscopy;;  . KNEE ARTHROSCOPY  1997   left  knee  . LEFT HEART CATH AND CORONARY ANGIOGRAPHY Left 11/10/2017   Procedure: LEFT HEART CATH AND CORONARY ANGIOGRAPHY;  Surgeon: Isaias Cowman, MD;  Location: [place] CV LAB;  Service: Cardiovascular;  Laterality: Left;  . LEFT HEART CATHETERIZATION WITH CORONARY ANGIOGRAM N/A 01/11/2013   Procedure: LEFT HEART CATHETERIZATION WITH CORONARY ANGIOGRAM;  Surgeon: Sinclair Grooms, MD;  Location: [facility] CATH LAB;  Service: Cardiovascular;   Laterality: N/A;  . TOTAL KNEE ARTHROPLASTY  08/30/2011   Procedure: TOTAL KNEE ARTHROPLASTY;  Surgeon: Lorn Junes, MD;  Location: [place];  Service: Orthopedics;  Laterality: Left;    Family Psychiatric History: Reviewed family psychiatric history from my progress note on 05/25/2017.  Family History:  Family History  Problem Relation Age of Onset  . Cancer Mother   . Hypertension Father   . Heart disease Father   . Alcohol abuse Father     Social History: Reviewed social history from my progress note on 05/25/2017 Social History   Socioeconomic History  . Marital status: Divorced    Spouse name: Not on file  . Number of children: 2  . Years of education: 20  . Highest education level: 11th grade  Occupational History    Employer: [employer]  . Financial resource strain: Somewhat hard  . Food insecurity:    Worry: Never true    Inability: Never true  . Transportation needs:    Medical: No    Non-medical: No  Tobacco Use  . Smoking status: Former Smoker    Packs/day: 2.00    Years: 15.00    Pack years: 30.00    Types: Cigarettes    Last attempt to quit: 02/09/1995    Years since quitting: 22.8  . Smokeless tobacco: Never Used  Substance and Sexual Activity  . Alcohol use: No    Alcohol/week: 0.0 standard drinks  . Drug use: No  . Sexual activity: Not Currently  Lifestyle  . Physical activity:    Days per week: 0 days    Minutes per session: 0 min  . Stress: Rather much  Relationships  . Social connections:    Talks on phone: More than three times a week    Gets together: Once a week    Attends religious service: Never    Active member of club or organization: No    Attends meetings of clubs or organizations: Never    Relationship status: Divorced  Other Topics Concern  . Not on file  Social History Narrative   Patient lives at home alone. Patient works at [employer].   Caffeine daily- 2   Right handed.   Education- 11 th grade     Allergies:  Allergies  Allergen Reactions  . Prolixin Decanoate [Fluphenazine]   . Benztropine Other (See Comments)    Hair fall out  Suicide thoughts  . Buprenorphine Hcl Other (See Comments)    Unable to void  . Codeine Hives    REACTION: hives  . Duloxetine Other (See Comments)    Altered mental status Alopecia, visual hallucinations, nightmares  . Gabapentin Swelling  . Lyrica [Pregabalin] Other (See Comments)    Alopecia, visual hallucinations, nightmares Altered mental status  . Morphine And Related Other (See Comments)    Unable to void  . Nortripytline Hcl [Nortriptyline] Other (See Comments)    Hair loss and night mares   . Nsaids     REACTION: palpitations, diaphoresis  . Penicillins Nausea And Vomiting and Other (See  Comments)    REACTION: upset stomach Has patient had a PCN reaction causing immediate rash, facial/tongue/throat swelling, SOB or lightheadedness with hypotension: No Has patient had a PCN reaction causing severe rash involving mucus membranes or skin necrosis: No Has patient had a PCN reaction that required hospitalization: No Has patient had a PCN reaction occurring within the last 10 years: No If all of the above answers are "NO", then may proceed with Cephalosporin use.  . Tolmetin Other (See Comments)    REACTION: palpitations, diaphoresis  . Wellbutrin [Bupropion]     Constipation, mood swings    Metabolic Disorder Labs: Lab Results  Component Value Date   HGBA1C 6.2 (H) 03/06/2017   MPG 131.24 03/06/2017   MPG 108 05/17/2015   Lab Results  Component Value Date   PROLACTIN 64.2 (H) 03/06/2017   Lab Results  Component Value Date   CHOL 175 03/06/2017   TRIG 234 (H) 03/06/2017   HDL 69 03/06/2017   CHOLHDL 2.5 03/06/2017   VLDL 47 (H) 03/06/2017   LDLCALC 59 03/06/2017   LDLCALC 93 05/17/2015   Lab Results  Component Value Date   TSH 2.766 08/24/2017   TSH 1.289 03/06/2017    Therapeutic Level Labs: No results found  for: LITHIUM No results found for: VALPROATE No components found for:  CBMZ  Current Medications: Current Outpatient Medications  Medication Sig Dispense Refill  . acetaminophen (TYLENOL) 500 MG tablet Take 1 tablet (500 mg total) by mouth every 6 (six) hours as needed for mild pain. 30 tablet 0  . albuterol (PROVENTIL HFA;VENTOLIN HFA) 108 (90 Base) MCG/ACT inhaler Inhale 2 puffs into the lungs every 6 (six) hours as needed for wheezing or shortness of breath.    Marland Kitchen aspirin 81 MG tablet Take 81 mg by mouth daily.     . cariprazine (VRAYLAR) capsule Take 1 capsule (1.5 mg total) by mouth daily. 30 capsule 1  . cetirizine (ZYRTEC) 10 MG tablet Take 10 mg by mouth daily.    . cholecalciferol (VITAMIN D) 1000 units tablet Take 1,000 Units by mouth daily.    . cyclobenzaprine (FLEXERIL) 10 MG tablet Take 10 mg by mouth 2 (two) times daily.     Marland Kitchen docusate sodium (COLACE) 100 MG capsule Take 100 mg by mouth 2 (two) times daily.     . furosemide (LASIX) 20 MG tablet Take 20 mg by mouth daily.     Marland Kitchen HYDROcodone-acetaminophen (NORCO/VICODIN) 5-325 MG tablet Take 0.5 tablets by mouth 2 (two) times daily. Every afternoon and at bedtime    . hydroxypropyl methylcellulose / hypromellose (ISOPTO TEARS / GONIOVISC) 2.5 % ophthalmic solution Place 1 drop into both eyes every 4 (four) hours as needed for dry eyes.    . metFORMIN (GLUCOPHAGE) 500 MG tablet Take 1 tablet (500 mg total) by mouth 2 (two) times daily with a meal. For diabetes management 10 tablet 0  . omeprazole (PRILOSEC) 40 MG capsule Take 40 mg by mouth daily as needed (heartburn).     . ondansetron (ZOFRAN) 4 MG tablet Take 4 mg by mouth every 8 (eight) hours as needed for nausea or vomiting.    . pravastatin (PRAVACHOL) 40 MG tablet Take 40 mg by mouth at bedtime.     . ranitidine (ZANTAC) 150 MG tablet Take 150 mg by mouth daily as needed for heartburn.     . rizatriptan (MAXALT) 10 MG tablet Take 10 mg by mouth 2 (two) times daily. May repeat  in 2 hours if  needed     . rOPINIRole (REQUIP XL) 2 MG 24 hr tablet Take by mouth.    . senna (SENOKOT) 8.6 MG tablet Take 1-4 tablets (8.6-34.4 mg total) by mouth daily as needed for constipation. (Patient taking differently: Take 1 tablet by mouth at bedtime. )    . topiramate (TOPAMAX) 100 MG tablet Take 0.5 tablets (50 mg total) by mouth at bedtime. For mood control 30 tablet 1  . traMADol (ULTRAM) 50 MG tablet Take 2 tablets (100 mg total) by mouth every 8 (eight) hours as needed for moderate pain or severe pain. (Patient taking differently: Take 50-100 mg by mouth See admin instructions. 100 mg every morning, 50 mg every afternoon, and 50 mg at bedtime) 180 tablet 2  . TRULICITY 1.5 AS/3.4HD SOPN Inject 1.5 mg into the skin every Monday.   5  . verapamil (CALAN-SR) 120 MG CR tablet Take 1 tablet (120 mg total) by mouth at bedtime. For high blood pressure 90 tablet 1  . vitamin B-12 (CYANOCOBALAMIN) 1000 MCG tablet Take 1,000 mcg by mouth daily.     No current facility-administered medications for this visit.      Musculoskeletal: Strength & Muscle Tone: within normal limits Gait & Station: normal Patient leans: N/A  Psychiatric Specialty Exam: Review of Systems  Psychiatric/Behavioral: The patient is nervous/anxious.   All other systems reviewed and are negative.   Blood pressure 125/85, pulse 93, temperature 97.6 F (36.4 C), temperature source Oral, weight 254 lb 3.2 oz (115.3 kg).Body mass index is 42.3 kg/m.  General Appearance: Casual  Eye Contact:  Fair  Speech:  Normal Rate  Volume:  Normal  Mood:  Anxious  Affect:  Congruent  Thought Process:  Goal Directed and Descriptions of Associations: Intact  Orientation:  Full (Time, Place, and Person)  Thought Content: Logical   Suicidal Thoughts:  No  Homicidal Thoughts:  No  Memory:  Immediate;   Fair Recent;   Fair Remote;   Fair  Judgement:  Fair  Insight:  Fair  Psychomotor Activity:  Normal  Concentration:   Concentration: Fair and Attention Span: Fair  Recall:  [employer] of Knowledge: Fair  Language: Fair  Akathisia:  No  Handed:  Right  AIMS (if indicated): 1  Assets:  Communication Skills Desire for Improvement Social Support  ADL's:  Intact  Cognition: WNL  Sleep:  Fair   Screenings: AIMS     Office Visit from 12/01/2017 in [place] Office Visit from 09/20/2017 in [place] Office Visit from 06/29/2017 in [place] Visit from 03/24/2017 in [place] Admission (Discharged) from 03/03/2017 in [place] [facility]  AIMS Total Score  1  0  6  10  0    AUDIT     Admission (Discharged) from 03/03/2017 in [place] [facility]  Alcohol Use Disorder Identification Test Final Score (AUDIT)  0    GAD-7     Office Visit from 08/22/2015 in [place] at [facility]  Total GAD-7 Score  6    PHQ2-9     Office Visit from 03/23/2016 in [place] Office Visit from 08/22/2015 in [place] at [place] from 08/06/2015 in [place] Office Visit from 06/10/2015 in [place] from 05/19/2015 in [place]  PHQ-2 Total Score  0  1  0  0  0       Assessment and Plan: Wendy is a 57 year old Caucasian female, history of schizoaffective disorder, PTSD, migraine headaches, diabetes mellitus, drug-induced Parkinsonian some, presented to the clinic today for a follow-up visit.  Patient reports her mood symptoms as stable however does not want to be on the olanzapine due to concerns of weight gain.  Discussed patient changes as noted below.  Plan Schizoaffective disorder Discontinue Zyprexa. Even though she was provided a  prescription for Rexulti - she could not start it due to cost. Start Vraylar 1.5 mg po daily.  PTSD/anxiety symptoms Vraylar 1.5 mg po daily.  Memory problems She will continue to follow-up with neurology.  Drug-induced Parkinsonian  She recently had appointment with neurology-currently started on Requip. AIMS - 1   Insomnia She denies any insomnia at this time we will continue to monitor closely.  Follow-up in clinic in 3 weeks or sooner if needed.  More than 50 % of the time was spent for psychoeducation and supportive psychotherapy and care coordination.  This note was generated in part or whole with voice recognition software. Voice recognition is usually quite accurate but there are transcription errors that can and very often do occur. I apologize for any typographical errors that were not detected and corrected.         Ursula Alert, MD 12/01/2017, 5:11 PM

## 2017-12-05 ENCOUNTER — Encounter: Payer: Self-pay | Admitting: Psychiatry

## 2017-12-05 ENCOUNTER — Other Ambulatory Visit: Payer: Self-pay

## 2017-12-05 ENCOUNTER — Ambulatory Visit: Payer: Medicare HMO | Admitting: Psychiatry

## 2017-12-05 VITALS — BP 131/91 | HR 109 | Temp 97.5°F | Wt 254.6 lb

## 2017-12-05 DIAGNOSIS — G2119 Other drug induced secondary parkinsonism: Secondary | ICD-10-CM | POA: Diagnosis not present

## 2017-12-05 DIAGNOSIS — F411 Generalized anxiety disorder: Secondary | ICD-10-CM

## 2017-12-05 DIAGNOSIS — F251 Schizoaffective disorder, depressive type: Secondary | ICD-10-CM | POA: Diagnosis not present

## 2017-12-05 DIAGNOSIS — F4310 Post-traumatic stress disorder, unspecified: Secondary | ICD-10-CM

## 2017-12-05 MED ORDER — OLANZAPINE 7.5 MG PO TABS: 7.5000 mg | ORAL_TABLET | Freq: Every day | ORAL | 1 refills | Status: DC

## 2017-12-05 NOTE — Progress Notes (Signed)
[facility] MD OP Progress Note  12/05/2017 2:50 PM Heather Haley  MRN:  [id number]  Chief Complaint: ' I am here for follow up." Chief Complaint    Follow-up; Medication Problem     HPI: Heather Haley is a 57 yr old Caucasian female, divorced, lives in [place] , history of depression, psychosis, drug-induced Parkinson's disease, memory problems, history of trauma, migraine headaches, diabetes mellitus, presented to the clinic today for a follow-up visit.  Patient today reports she cannot afford the Vraylar .  She reports she was given the option to try Seroquel or risperidone by her health insurance plan.  Discussed with patient that Seroquel can also cause weight gain and risperidone can make her Parkinson's disease worse. Patient reports she wants to go back on her olanzapine.  She reports she has started hearing voices again.  She reports she would like to increase the dosage of Zyprexa if possible.  She also reports sleep as affected ever since being off of Zyprexa.  Patient denies suicidality.  Patient denies homicidality.  Continues to have mild tremors of her bilateral upper extremities.  She reports she is going to start Requip today , prescribed by neurology.  Patient denies any other concerns today.  Visit Diagnosis:    ICD-10-CM   1. Schizoaffective disorder, depressive type ([place]) F25.1 OLANZapine (ZYPREXA) 7.5 MG tablet  2. PTSD (post-traumatic stress disorder) F43.10 OLANZapine (ZYPREXA) 7.5 MG tablet  3. Drug-induced Parkinson's disease ([place]) G21.19   4. Generalized anxiety disorder F41.1 OLANZapine (ZYPREXA) 7.5 MG tablet    Past Psychiatric History: Reviewed past psychiatric history from my progress note on 05/25/2017.  Past Medical History:  Past Medical History:  Diagnosis Date  . Anxiety   . Chronic kidney disease   . Chronic pain    previously saw Dr. Consuela Mimes in pain clinic, then saw pain specialist in [place]  . Depression   . Diabetes mellitus ([name])   .  Frequency of urination   . GERD (gastroesophageal reflux disease)   . Headache(784.0)   . High cholesterol   . Hypertension   . IBS (irritable bowel syndrome)   . Left ankle instability   . Left knee DJD   . Lumbar Degenerative Disc Disease of  10/11/2014  . Neuromuscular disorder ([place])   . Osteoarthritis of hip (Right) 05/05/2015  . Other enthesopathy of ankle and tarsus 12/15/2009   Qualifier: Diagnosis of  By: Oneida Alar MD, KARL    . Parkinson's disease ([place])   . Peripheral sensory neuropathy (Bilateral) 11/19/2014  . Postoperative nausea and vomiting   . Schizophrenia [name])     Past Surgical History:  Procedure Laterality Date  . ABDOMINAL HYSTERECTOMY    . ANKLE SURGERY    . APPENDECTOMY    . COLONOSCOPY  2013  . COLONOSCOPY WITH PROPOFOL N/A 04/25/2017   Procedure: COLONOSCOPY WITH PROPOFOL;  Surgeon: Manya Silvas, MD;  Location[facility] ENDOSCOPY;  Service: Endoscopy;  Laterality: N/A;  . ESOPHAGOGASTRODUODENOSCOPY (EGD) WITH PROPOFOL N/A 10/03/2014   Procedure: ESOPHAGOGASTRODUODENOSCOPY (EGD) WITH PROPOFOL;  Surgeon: Josefine Class, MD;  Location: [facility] ENDOSCOPY;  Service: Endoscopy;  Laterality: N/A;  . ESOPHAGOGASTRODUODENOSCOPY (EGD) WITH PROPOFOL  04/25/2017   Procedure: ESOPHAGOGASTRODUODENOSCOPY (EGD) WITH PROPOFOL;  Surgeon: Manya Silvas, MD;  Location: [facility] ENDOSCOPY;  Service: Endoscopy;;  . KNEE ARTHROSCOPY  1997   left knee  . LEFT HEART CATH AND CORONARY ANGIOGRAPHY Left 11/10/2017   Procedure: LEFT HEART CATH AND CORONARY ANGIOGRAPHY;  Surgeon: Isaias Cowman, MD;  Location: [facility]  INVASIVE CV LAB;  Service: Cardiovascular;  Laterality: Left;  . LEFT HEART CATHETERIZATION WITH CORONARY ANGIOGRAM N/A 01/11/2013   Procedure: LEFT HEART CATHETERIZATION WITH CORONARY ANGIOGRAM;  Surgeon: Sinclair Grooms, MD;  Location: [facility] CATH LAB;  Service: Cardiovascular;  Laterality: N/A;  . TOTAL KNEE ARTHROPLASTY  08/30/2011   Procedure: TOTAL KNEE ARTHROPLASTY;   Surgeon: Lorn Junes, MD;  Location: [place];  Service: Orthopedics;  Laterality: Left;    Family Psychiatric History: Reviewed family psychiatric history from my progress note on 05/25/2017.  Family History:  Family History  Problem Relation Age of Onset  . Cancer Mother   . Hypertension Father   . Heart disease Father   . Alcohol abuse Father     Social History: Have reviewed social history from my progress note on 05/25/2017. Social History   Socioeconomic History  . Marital status: Divorced    Spouse name: Not on file  . Number of children: 2  . Years of education: 42  . Highest education level: 11th grade  Occupational History    Employer: [employer]  . Financial resource strain: Somewhat hard  . Food insecurity:    Worry: Never true    Inability: Never true  . Transportation needs:    Medical: No    Non-medical: No  Tobacco Use  . Smoking status: Former Smoker    Packs/day: 2.00    Years: 15.00    Pack years: 30.00    Types: Cigarettes    Last attempt to quit: 02/09/1995    Years since quitting: 22.8  . Smokeless tobacco: Never Used  Substance and Sexual Activity  . Alcohol use: No    Alcohol/week: 0.0 standard drinks  . Drug use: No  . Sexual activity: Not Currently  Lifestyle  . Physical activity:    Days per week: 0 days    Minutes per session: 0 min  . Stress: Rather much  Relationships  . Social connections:    Talks on phone: More than three times a week    Gets together: Once a week    Attends religious service: Never    Active member of club or organization: No    Attends meetings of clubs or organizations: Never    Relationship status: Divorced  Other Topics Concern  . Not on file  Social History Narrative   Patient lives at home alone. Patient works at [employer].   Caffeine daily- 2   Right handed.   Education- 11 th grade    Allergies:  Allergies  Allergen Reactions  . Prolixin Decanoate [Fluphenazine]   .  Benztropine Other (See Comments)    Hair fall out  Suicide thoughts  . Buprenorphine Hcl Other (See Comments)    Unable to void  . Codeine Hives    REACTION: hives  . Duloxetine Other (See Comments)    Altered mental status Alopecia, visual hallucinations, nightmares  . Gabapentin Swelling  . Lyrica [Pregabalin] Other (See Comments)    Alopecia, visual hallucinations, nightmares Altered mental status  . Morphine And Related Other (See Comments)    Unable to void  . Nortripytline Hcl [Nortriptyline] Other (See Comments)    Hair loss and night mares   . Nsaids     REACTION: palpitations, diaphoresis  . Penicillins Nausea And Vomiting and Other (See Comments)    REACTION: upset stomach Has patient had a PCN reaction causing immediate rash, facial/tongue/throat swelling, SOB or lightheadedness with hypotension: No Has patient had  a PCN reaction causing severe rash involving mucus membranes or skin necrosis: No Has patient had a PCN reaction that required hospitalization: No Has patient had a PCN reaction occurring within the last 10 years: No If all of the above answers are "NO", then may proceed with Cephalosporin use.  . Tolmetin Other (See Comments)    REACTION: palpitations, diaphoresis  . Wellbutrin [Bupropion]     Constipation, mood swings    Metabolic Disorder Labs: Lab Results  Component Value Date   HGBA1C 6.2 (H) 03/06/2017   MPG 131.24 03/06/2017   MPG 108 05/17/2015   Lab Results  Component Value Date   PROLACTIN 64.2 (H) 03/06/2017   Lab Results  Component Value Date   CHOL 175 03/06/2017   TRIG 234 (H) 03/06/2017   HDL 69 03/06/2017   CHOLHDL 2.5 03/06/2017   VLDL 47 (H) 03/06/2017   LDLCALC 59 03/06/2017   LDLCALC 93 05/17/2015   Lab Results  Component Value Date   TSH 2.766 08/24/2017   TSH 1.289 03/06/2017    Therapeutic Level Labs: No results found for: LITHIUM No results found for: VALPROATE No components found for:  CBMZ  Current  Medications: Current Outpatient Medications  Medication Sig Dispense Refill  . acetaminophen (TYLENOL) 500 MG tablet Take 1 tablet (500 mg total) by mouth every 6 (six) hours as needed for mild pain. 30 tablet 0  . albuterol (PROVENTIL HFA;VENTOLIN HFA) 108 (90 Base) MCG/ACT inhaler Inhale 2 puffs into the lungs every 6 (six) hours as needed for wheezing or shortness of breath.    Marland Kitchen aspirin 81 MG tablet Take 81 mg by mouth daily.     . cetirizine (ZYRTEC) 10 MG tablet Take 10 mg by mouth daily.    . cholecalciferol (VITAMIN D) 1000 units tablet Take 1,000 Units by mouth daily.    . cyclobenzaprine (FLEXERIL) 10 MG tablet Take 10 mg by mouth 2 (two) times daily.     Marland Kitchen docusate sodium (COLACE) 100 MG capsule Take 100 mg by mouth 2 (two) times daily.     . furosemide (LASIX) 20 MG tablet Take 20 mg by mouth daily.     Marland Kitchen HYDROcodone-acetaminophen (NORCO/VICODIN) 5-325 MG tablet Take 0.5 tablets by mouth 2 (two) times daily. Every afternoon and at bedtime    . hydroxypropyl methylcellulose / hypromellose (ISOPTO TEARS / GONIOVISC) 2.5 % ophthalmic solution Place 1 drop into both eyes every 4 (four) hours as needed for dry eyes.    . metFORMIN (GLUCOPHAGE) 500 MG tablet Take 1 tablet (500 mg total) by mouth 2 (two) times daily with a meal. For diabetes management 10 tablet 0  . OLANZapine (ZYPREXA) 7.5 MG tablet Take 1 tablet (7.5 mg total) by mouth at bedtime. 30 tablet 1  . omeprazole (PRILOSEC) 40 MG capsule Take 40 mg by mouth daily as needed (heartburn).     . ondansetron (ZOFRAN) 4 MG tablet Take 4 mg by mouth every 8 (eight) hours as needed for nausea or vomiting.    . pravastatin (PRAVACHOL) 40 MG tablet Take 40 mg by mouth at bedtime.     . ranitidine (ZANTAC) 150 MG tablet Take 150 mg by mouth daily as needed for heartburn.     . rizatriptan (MAXALT) 10 MG tablet Take 10 mg by mouth 2 (two) times daily. May repeat in 2 hours if needed     . rOPINIRole (REQUIP XL) 2 MG 24 hr tablet Take by  mouth.    . senna (SENOKOT) 8.6  MG tablet Take 1-4 tablets (8.6-34.4 mg total) by mouth daily as needed for constipation. (Patient taking differently: Take 1 tablet by mouth at bedtime. )    . topiramate (TOPAMAX) 100 MG tablet Take 0.5 tablets (50 mg total) by mouth at bedtime. For mood control 30 tablet 1  . traMADol (ULTRAM) 50 MG tablet Take 2 tablets (100 mg total) by mouth every 8 (eight) hours as needed for moderate pain or severe pain. (Patient taking differently: Take 50-100 mg by mouth See admin instructions. 100 mg every morning, 50 mg every afternoon, and 50 mg at bedtime) 180 tablet 2  . TRULICITY 1.5 ZC/5.8IF SOPN Inject 1.5 mg into the skin every Monday.   5  . verapamil (CALAN-SR) 120 MG CR tablet Take 1 tablet (120 mg total) by mouth at bedtime. For high blood pressure 90 tablet 1  . vitamin B-12 (CYANOCOBALAMIN) 1000 MCG tablet Take 1,000 mcg by mouth daily.     No current facility-administered medications for this visit.      Musculoskeletal: Strength & Muscle Tone: within normal limits Gait & Station: normal Patient leans: N/A  Psychiatric Specialty Exam: Review of Systems  Neurological: Positive for tremors.  Psychiatric/Behavioral: Positive for depression and hallucinations. The patient is nervous/anxious.   All other systems reviewed and are negative.   Blood pressure (!) 131/91, pulse (!) 109, temperature (!) 97.5 F (36.4 C), temperature source Oral, weight 254 lb 9.6 oz (115.5 kg).Body mass index is 42.37 kg/m.  General Appearance: Casual  Eye Contact:  Fair  Speech:  Clear and Coherent  Volume:  Normal  Mood:  Anxious and Dysphoric  Affect:  Congruent  Thought Process:  Goal Directed and Descriptions of Associations: Intact  Orientation:  Full (Time, Place, and Person)  Thought Content: Hallucinations: Auditory , hears " clicks.'  Suicidal Thoughts:  No  Homicidal Thoughts:  No  Memory:  Immediate;   Fair Recent;   Fair Remote;   Fair  Judgement:   Fair  Insight:  Fair  Psychomotor Activity:  Tremor  Concentration:  Concentration: Fair and Attention Span: Fair  Recall:  [employer] of Knowledge: Fair  Language: Fair  Akathisia:  No  Handed:  Right  AIMS (if indicated): has mild tremors of UE, denies stiffness, rigidity  Assets:  Communication Skills Desire for Improvement Social Support  ADL's:  Intact  Cognition: WNL  Sleep:  Poor   Screenings: AIMS     Office Visit from 12/01/2017 in [place] Office Visit from 09/20/2017 in [place] Visit from 06/29/2017 in [place] Visit from 03/24/2017 in [place] Admission (Discharged) from 03/03/2017 in [place] [facility]  AIMS Total Score  1  0  6  10  0    AUDIT     Admission (Discharged) from 03/03/2017 in [place] [facility]  Alcohol Use Disorder Identification Test Final Score (AUDIT)  0    GAD-7     Office Visit from 08/22/2015 in [place] at [facility]  Total GAD-7 Score  6    PHQ2-9     Office Visit from 03/23/2016 in [place] Office Visit from 08/22/2015 in [place] at [place] from 08/06/2015 in [place] Office Visit from 06/10/2015 in [place] from 05/19/2015 in [place]  PHQ-2 Total Score  0  1  0  0  0       Assessment and Plan: Karissa is a 57 yr old Caucasian female, history of schizoaffective disorder, PTSD, migraine headaches, diabetes mellitus, drug-induced Parkinson's disease, presented to the clinic today for a follow-up visit.  Patient today presented reporting worsening perceptual disturbances and sleep.  Discussed plan as noted below.  Plan Schizoaffective  disorder Increase olanzapine to 7.5 mg p.o. nightly Patient could not afford Vraylar , but Janett Billow CMA will work with her Associate Professor. In the mean time if she wants to continue Vraylar will offer samples instead of zyprexa.  PTSD Continue medications as noted above.  Memory problems Pt will continue to work with neurology.  Drug-induced Parkinsonism Patient was recently started on Requip per neurology  Insomnia Zyprexa as prescribed now.  Follow-up in clinic in 4 weeks or sooner if needed.  More than 50 % of the time was spent for psychoeducation and supportive psychotherapy and care coordination.  This note was generated in part or whole with voice recognition software. Voice recognition is usually quite accurate but there are transcription errors that can and very often do occur. I apologize for any typographical errors that were not detected and corrected.           Ursula Alert, MD 12/05/2017, 2:50 PM

## 2017-12-06 DIAGNOSIS — E538 Deficiency of other specified B group vitamins: Secondary | ICD-10-CM | POA: Diagnosis not present

## 2017-12-06 DIAGNOSIS — M7918 Myalgia, other site: Secondary | ICD-10-CM | POA: Diagnosis not present

## 2017-12-06 DIAGNOSIS — M5481 Occipital neuralgia: Secondary | ICD-10-CM | POA: Diagnosis not present

## 2017-12-10 ENCOUNTER — Telehealth (HOSPITAL_COMMUNITY): Payer: Self-pay

## 2017-12-10 NOTE — Telephone Encounter (Signed)
received fax that [employer] approved the vraylar 1.5mg  until 02-08-2019

## 2017-12-14 ENCOUNTER — Ambulatory Visit: Payer: Medicare HMO | Admitting: Psychiatry

## 2017-12-20 DIAGNOSIS — N182 Chronic kidney disease, stage 2 (mild): Secondary | ICD-10-CM | POA: Diagnosis not present

## 2017-12-20 DIAGNOSIS — E1122 Type 2 diabetes mellitus with diabetic chronic kidney disease: Secondary | ICD-10-CM | POA: Diagnosis not present

## 2017-12-27 DIAGNOSIS — Z780 Asymptomatic menopausal state: Secondary | ICD-10-CM | POA: Diagnosis not present

## 2017-12-27 DIAGNOSIS — Z1239 Encounter for other screening for malignant neoplasm of breast: Secondary | ICD-10-CM | POA: Diagnosis not present

## 2017-12-27 DIAGNOSIS — Z23 Encounter for immunization: Secondary | ICD-10-CM | POA: Diagnosis not present

## 2017-12-27 DIAGNOSIS — E1122 Type 2 diabetes mellitus with diabetic chronic kidney disease: Secondary | ICD-10-CM | POA: Diagnosis not present

## 2017-12-27 DIAGNOSIS — Z0000 Encounter for general adult medical examination without abnormal findings: Secondary | ICD-10-CM | POA: Diagnosis not present

## 2017-12-27 DIAGNOSIS — I10 Essential (primary) hypertension: Secondary | ICD-10-CM | POA: Diagnosis not present

## 2017-12-27 DIAGNOSIS — N183 Chronic kidney disease, stage 3 (moderate): Secondary | ICD-10-CM | POA: Diagnosis not present

## 2017-12-27 DIAGNOSIS — G8929 Other chronic pain: Secondary | ICD-10-CM | POA: Diagnosis not present

## 2017-12-27 DIAGNOSIS — Z5181 Encounter for therapeutic drug level monitoring: Secondary | ICD-10-CM | POA: Diagnosis not present

## 2017-12-28 ENCOUNTER — Other Ambulatory Visit: Payer: Self-pay | Admitting: Internal Medicine

## 2018-01-02 ENCOUNTER — Encounter: Payer: Self-pay | Admitting: Psychiatry

## 2018-01-02 ENCOUNTER — Ambulatory Visit: Payer: Medicare HMO | Admitting: Psychiatry

## 2018-01-02 ENCOUNTER — Other Ambulatory Visit: Payer: Self-pay

## 2018-01-02 VITALS — BP 126/81 | HR 101 | Temp 97.5°F | Wt 256.2 lb

## 2018-01-02 DIAGNOSIS — M8588 Other specified disorders of bone density and structure, other site: Secondary | ICD-10-CM | POA: Diagnosis not present

## 2018-01-02 DIAGNOSIS — F411 Generalized anxiety disorder: Secondary | ICD-10-CM | POA: Diagnosis not present

## 2018-01-02 DIAGNOSIS — G2119 Other drug induced secondary parkinsonism: Secondary | ICD-10-CM

## 2018-01-02 DIAGNOSIS — F4310 Post-traumatic stress disorder, unspecified: Secondary | ICD-10-CM

## 2018-01-02 DIAGNOSIS — F251 Schizoaffective disorder, depressive type: Secondary | ICD-10-CM | POA: Diagnosis not present

## 2018-01-02 MED ORDER — ZOLPIDEM TARTRATE 5 MG PO TABS: 5.0000 mg | ORAL_TABLET | Freq: Every evening | ORAL | 1 refills | Status: DC | PRN

## 2018-01-02 NOTE — Progress Notes (Signed)
[facility] OP Progress Note  01/02/2018 5:49 PM Heather Haley  MRN:  [id number]  Chief Complaint: ' I am here for follow up.' Chief Complaint    Follow-up; Medication Refill     HPI: Heather Haley is a 57 year old Caucasian female, divorced, lives in [place], has a history of schizoaffective disorder, drug-induced Parkinson's disease, memory problems, history of trauma, migraine headaches, diabetes mellitus, presented to the clinic today for a follow-up visit.  Patient today reports she is doing well on the Zyprexa.  She reports that when the dosage was increased her appetite actually went down.  She reports she is not worried about the weight gain at this time.  She reports the Zyprexa has been helpful with her auditory hallucinations.  She reports they have since resolved.  Patient reports she continues to struggle with sleep.  Discussed adding Ambien.  She reports she tried it in the past and it worked for her.  Patient continues to have mild tremors of her upper and lower extremities.  She reports she was prescribed Requip by her neurologist however she is waiting for the medication to arrive.  She may be able to start it soon.  Discussed with patient that since she is currently waiting for that we will reevaluate when she returns for her next visit.  Patient denies any suicidality.  Patient denies any homicidality.  Patient denies any other concerns today. Visit Diagnosis:    ICD-10-CM   1. Schizoaffective disorder, depressive type ([place]) F25.1   2. PTSD (post-traumatic stress disorder) F43.10   3. Drug-induced Parkinson's disease ([place]) G21.19   4. Generalized anxiety disorder F41.1     Past Psychiatric History: I have reviewed past psychiatric history from my progress note on 05/25/2017  Past Medical History:  Past Medical History:  Diagnosis Date  . Anxiety   . Chronic kidney disease   . Chronic pain    previously saw Dr. Consuela Mimes in pain clinic, then saw pain specialist in  [place]  . Depression   . Diabetes mellitus ([place])   . Frequency of urination   . GERD (gastroesophageal reflux disease)   . Headache(784.0)   . High cholesterol   . Hypertension   . IBS (irritable bowel syndrome)   . Left ankle instability   . Left knee DJD   . Lumbar Degenerative Disc Disease of  10/11/2014  . Neuromuscular disorder ([place])   . Osteoarthritis of hip (Right) 05/05/2015  . Other enthesopathy of ankle and tarsus 12/15/2009   Qualifier: Diagnosis of  By: Oneida Alar MD, KARL    . Parkinson's disease ([place])   . Peripheral sensory neuropathy (Bilateral) 11/19/2014  . Postoperative nausea and vomiting   . Schizophrenia [facility])     Past Surgical History:  Procedure Laterality Date  . ABDOMINAL HYSTERECTOMY    . ANKLE SURGERY    . APPENDECTOMY    . COLONOSCOPY  2013  . COLONOSCOPY WITH PROPOFOL N/A 04/25/2017   Procedure: COLONOSCOPY WITH PROPOFOL;  Surgeon: Manya Silvas, MD;  Location: [facility] ENDOSCOPY;  Service: Endoscopy;  Laterality: N/A;  . ESOPHAGOGASTRODUODENOSCOPY (EGD) WITH PROPOFOL N/A 10/03/2014   Procedure: ESOPHAGOGASTRODUODENOSCOPY (EGD) WITH PROPOFOL;  Surgeon: Josefine Class, MD;  Location: [facility] ENDOSCOPY;  Service: Endoscopy;  Laterality: N/A;  . ESOPHAGOGASTRODUODENOSCOPY (EGD) WITH PROPOFOL  04/25/2017   Procedure: ESOPHAGOGASTRODUODENOSCOPY (EGD) WITH PROPOFOL;  Surgeon: Manya Silvas, MD;  Location: [facility] ENDOSCOPY;  Service: Endoscopy;;  . KNEE ARTHROSCOPY  1997   left knee  . LEFT HEART CATH AND CORONARY  ANGIOGRAPHY Left 11/10/2017   Procedure: LEFT HEART CATH AND CORONARY ANGIOGRAPHY;  Surgeon: Isaias Cowman, MD;  Location: [place] CV LAB;  Service: Cardiovascular;  Laterality: Left;  . LEFT HEART CATHETERIZATION WITH CORONARY ANGIOGRAM N/A 01/11/2013   Procedure: LEFT HEART CATHETERIZATION WITH CORONARY ANGIOGRAM;  Surgeon: Sinclair Grooms, MD;  Location: [facility] CATH LAB;  Service: Cardiovascular;  Laterality: N/A;  . TOTAL KNEE  ARTHROPLASTY  08/30/2011   Procedure: TOTAL KNEE ARTHROPLASTY;  Surgeon: Lorn Junes, MD;  Location: [place];  Service: Orthopedics;  Laterality: Left;    Family Psychiatric History: I have reviewed family psychiatric history from my progress note on 05/25/2017  Family History:  Family History  Problem Relation Age of Onset  . Cancer Mother   . Hypertension Father   . Heart disease Father   . Alcohol abuse Father     Social History: Have reviewed social history from my progress note on 05/25/2017 Social History   Socioeconomic History  . Marital status: Divorced    Spouse name: Not on file  . Number of children: 2  . Years of education: 32  . Highest education level: 11th grade  Occupational History    Employer: [employer]  . Financial resource strain: Somewhat hard  . Food insecurity:    Worry: Never true    Inability: Never true  . Transportation needs:    Medical: No    Non-medical: No  Tobacco Use  . Smoking status: Former Smoker    Packs/day: 2.00    Years: 15.00    Pack years: 30.00    Types: Cigarettes    Last attempt to quit: 02/09/1995    Years since quitting: 22.9  . Smokeless tobacco: Never Used  Substance and Sexual Activity  . Alcohol use: No    Alcohol/week: 0.0 standard drinks  . Drug use: No  . Sexual activity: Not Currently  Lifestyle  . Physical activity:    Days per week: 0 days    Minutes per session: 0 min  . Stress: Rather much  Relationships  . Social connections:    Talks on phone: More than three times a week    Gets together: Once a week    Attends religious service: Never    Active member of club or organization: No    Attends meetings of clubs or organizations: Never    Relationship status: Divorced  Other Topics Concern  . Not on file  Social History Narrative   Patient lives at home alone. Patient works at [employer].   Caffeine daily- 2   Right handed.   Education- 11 th grade    Allergies:   Allergies  Allergen Reactions  . Prolixin Decanoate [Fluphenazine]   . Benztropine Other (See Comments)    Hair fall out  Suicide thoughts  . Buprenorphine Hcl Other (See Comments)    Unable to void  . Carbidopa-Levodopa Nausea Only  . Codeine Hives    REACTION: hives  . Duloxetine Other (See Comments)    Altered mental status Alopecia, visual hallucinations, nightmares  . Gabapentin Swelling  . Lyrica [Pregabalin] Other (See Comments)    Alopecia, visual hallucinations, nightmares Altered mental status  . Morphine And Related Other (See Comments)    Unable to void  . Nortripytline Hcl [Nortriptyline] Other (See Comments)    Hair loss and night mares   . Nsaids     REACTION: palpitations, diaphoresis  . Penicillins Nausea And Vomiting and Other (See  Comments)    REACTION: upset stomach Has patient had a PCN reaction causing immediate rash, facial/tongue/throat swelling, SOB or lightheadedness with hypotension: No Has patient had a PCN reaction causing severe rash involving mucus membranes or skin necrosis: No Has patient had a PCN reaction that required hospitalization: No Has patient had a PCN reaction occurring within the last 10 years: No If all of the above answers are "NO", then may proceed with Cephalosporin use.  . Tolmetin Other (See Comments)    REACTION: palpitations, diaphoresis  . Wellbutrin [Bupropion]     Constipation, mood swings    Metabolic Disorder Labs: Lab Results  Component Value Date   HGBA1C 6.2 (H) 03/06/2017   MPG 131.24 03/06/2017   MPG 108 05/17/2015   Lab Results  Component Value Date   PROLACTIN 64.2 (H) 03/06/2017   Lab Results  Component Value Date   CHOL 175 03/06/2017   TRIG 234 (H) 03/06/2017   HDL 69 03/06/2017   CHOLHDL 2.5 03/06/2017   VLDL 47 (H) 03/06/2017   LDLCALC 59 03/06/2017   LDLCALC 93 05/17/2015   Lab Results  Component Value Date   TSH 2.766 08/24/2017   TSH 1.289 03/06/2017    Therapeutic Level  Labs: No results found for: LITHIUM No results found for: VALPROATE No components found for:  CBMZ  Current Medications: Current Outpatient Medications  Medication Sig Dispense Refill  . acetaminophen (TYLENOL) 500 MG tablet Take 1 tablet (500 mg total) by mouth every 6 (six) hours as needed for mild pain. 30 tablet 0  . albuterol (PROVENTIL HFA;VENTOLIN HFA) 108 (90 Base) MCG/ACT inhaler Inhale 2 puffs into the lungs every 6 (six) hours as needed for wheezing or shortness of breath.    Marland Kitchen aspirin 81 MG tablet Take 81 mg by mouth daily.     . cetirizine (ZYRTEC) 10 MG tablet Take 10 mg by mouth daily.    . cholecalciferol (VITAMIN D) 1000 units tablet Take 1,000 Units by mouth daily.    . cyclobenzaprine (FLEXERIL) 10 MG tablet Take 10 mg by mouth 2 (two) times daily.     Marland Kitchen docusate sodium (COLACE) 100 MG capsule Take 100 mg by mouth 2 (two) times daily.     . furosemide (LASIX) 20 MG tablet Take 20 mg by mouth daily.     Marland Kitchen HYDROcodone-acetaminophen (NORCO/VICODIN) 5-325 MG tablet Take 0.5 tablets by mouth 2 (two) times daily. Every afternoon and at bedtime    . hydroxypropyl methylcellulose / hypromellose (ISOPTO TEARS / GONIOVISC) 2.5 % ophthalmic solution Place 1 drop into both eyes every 4 (four) hours as needed for dry eyes.    . metFORMIN (GLUCOPHAGE) 500 MG tablet Take 1 tablet (500 mg total) by mouth 2 (two) times daily with a meal. For diabetes management 10 tablet 0  . OLANZapine (ZYPREXA) 7.5 MG tablet Take 1 tablet (7.5 mg total) by mouth at bedtime. 30 tablet 1  . omeprazole (PRILOSEC) 40 MG capsule Take 40 mg by mouth daily as needed (heartburn).     . ondansetron (ZOFRAN) 4 MG tablet Take 4 mg by mouth every 8 (eight) hours as needed for nausea or vomiting.    . pravastatin (PRAVACHOL) 40 MG tablet Take 40 mg by mouth at bedtime.     . ranitidine (ZANTAC) 150 MG tablet Take 150 mg by mouth daily as needed for heartburn.     . rizatriptan (MAXALT) 10 MG tablet Take 10 mg by mouth  2 (two) times daily. May repeat in  2 hours if needed     . senna (SENOKOT) 8.6 MG tablet Take 1-4 tablets (8.6-34.4 mg total) by mouth daily as needed for constipation. (Patient taking differently: Take 1 tablet by mouth at bedtime. )    . topiramate (TOPAMAX) 100 MG tablet Take 0.5 tablets (50 mg total) by mouth at bedtime. For mood control 30 tablet 1  . traMADol (ULTRAM) 50 MG tablet Take 2 tablets (100 mg total) by mouth every 8 (eight) hours as needed for moderate pain or severe pain. (Patient taking differently: Take 50-100 mg by mouth See admin instructions. 100 mg every morning, 50 mg every afternoon, and 50 mg at bedtime) 180 tablet 2  . verapamil (CALAN-SR) 120 MG CR tablet Take 1 tablet (120 mg total) by mouth at bedtime. For high blood pressure 90 tablet 1  . vitamin B-12 (CYANOCOBALAMIN) 1000 MCG tablet Take 1,000 mcg by mouth daily.    Marland Kitchen rOPINIRole (REQUIP XL) 2 MG 24 hr tablet Take by mouth.    . zolpidem (AMBIEN) 5 MG tablet Take 1 tablet (5 mg total) by mouth at bedtime as needed for sleep. 30 tablet 1   No current facility-administered medications for this visit.      Musculoskeletal: Strength & Muscle Tone: within normal limits Gait & Station: walks with cane Patient leans: N/A  Psychiatric Specialty Exam: Review of Systems  Neurological: Positive for tremors.  Psychiatric/Behavioral: The patient has insomnia.   All other systems reviewed and are negative.   Blood pressure 126/81, pulse (!) 101, temperature (!) 97.5 F (36.4 C), temperature source Oral, weight 256 lb 3.2 oz (116.2 kg).Body mass index is 42.63 kg/m.  General Appearance: Casual  Eye Contact:  Fair  Speech:  Clear and Coherent  Volume:  Normal  Mood:  Euthymic  Affect:  Congruent  Thought Process:  Goal Directed and Descriptions of Associations: Intact  Orientation:  Full (Time, Place, and Person)  Thought Content: Logical   Suicidal Thoughts:  No  Homicidal Thoughts:  No  Memory:  Immediate;    Fair Recent;   Fair Remote;   Fair  Judgement:  Fair  Insight:  Fair  Psychomotor Activity:  Tremor  Concentration:  Concentration: Fair and Attention Span: Fair  Recall:  [employer] of Knowledge: Fair  Language: Fair  Akathisia:  No  Handed:  Right  AIMS (if indicated): has mild tremors or UE ,LE , denies stiffness, rigidity  Assets:  Communication Skills Desire for Improvement Social Support  ADL's:  Intact  Cognition: WNL  Sleep:  Poor   Screenings: AIMS     Office Visit from 12/01/2017 in [place] Office Visit from 09/20/2017 in [place] Visit from 06/29/2017 in [place] Visit from 03/24/2017 in [place] Admission (Discharged) from 03/03/2017 in [facility]  AIMS Total Score  1  0  6  10  0    AUDIT     Admission (Discharged) from 03/03/2017 in [place] [facility]  Alcohol Use Disorder Identification Test Final Score (AUDIT)  0    GAD-7     Office Visit from 08/22/2015 in [place] at [facility]  Total GAD-7 Score  6    PHQ2-9     Office Visit from 03/23/2016 in [place] Office Visit from 08/22/2015 in [place] at [place] from 08/06/2015 in [place]  CLINIC Office Visit from 06/10/2015 in [place] Clinical Support from 05/19/2015 in [place] [facility]  PHQ-2 Total Score  0  1  0  0  0       Assessment and Plan: Breshae is a 57 yr old patient female, history of schizoaffective disorder, PTSD, migraine headaches, diabetes mellitus, drug-induced Parkinson's disease, presented to the clinic today for a follow-up visit.  Patient today returns reporting that her mood symptoms and psychosis are currently  stable on the current dosage of Zyprexa.  Patient continues to struggle with some tremors.  She has not started medications as prescribed per neurology yet.  Discussed the following plan with patient.  Plan Schizoaffective disorder Olanzapine 7.5 mg p.o. Qhs.  For PTSD No changes being made.  She denies any significant symptoms at this time.  Memory problems She reports she was advised by neurology to take a lower dose of Topamax.  She will continue to work with neurology.  For drug-induced Parkinsonian  Patient reports she was prescribed Requip per neurology, verified this by reviewing most recent notes per neurology. Patient however reports she has not started the medication yet.  She reports she will start it soon.  Will continue to monitor her closely.  Insomnia Start Ambien 5 mg p.o. nightly as needed  Follow-up in clinic in 4 weeks or sooner if needed.  More than 50 % of the time was spent for psychoeducation and supportive psychotherapy and care coordination.  This note was generated in part or whole with voice recognition software. Voice recognition is usually quite accurate but there are transcription errors that can and very often do occur. I apologize for any typographical errors that were not detected and corrected.        Ursula Alert, MD 01/02/2018, 5:49 PM

## 2018-01-03 ENCOUNTER — Other Ambulatory Visit: Payer: Self-pay | Admitting: Psychiatry

## 2018-01-24 DIAGNOSIS — M25562 Pain in left knee: Secondary | ICD-10-CM | POA: Diagnosis not present

## 2018-01-24 DIAGNOSIS — M24542 Contracture, left hand: Secondary | ICD-10-CM | POA: Diagnosis not present

## 2018-01-24 DIAGNOSIS — M79605 Pain in left leg: Secondary | ICD-10-CM | POA: Diagnosis not present

## 2018-01-24 DIAGNOSIS — M533 Sacrococcygeal disorders, not elsewhere classified: Secondary | ICD-10-CM | POA: Diagnosis not present

## 2018-01-24 DIAGNOSIS — M7918 Myalgia, other site: Secondary | ICD-10-CM | POA: Diagnosis not present

## 2018-01-24 DIAGNOSIS — E538 Deficiency of other specified B group vitamins: Secondary | ICD-10-CM | POA: Diagnosis not present

## 2018-01-24 DIAGNOSIS — G8929 Other chronic pain: Secondary | ICD-10-CM | POA: Diagnosis not present

## 2018-01-24 DIAGNOSIS — G5602 Carpal tunnel syndrome, left upper limb: Secondary | ICD-10-CM | POA: Diagnosis not present

## 2018-01-25 ENCOUNTER — Other Ambulatory Visit: Payer: Self-pay | Admitting: Unknown Physician Specialty

## 2018-01-25 ENCOUNTER — Other Ambulatory Visit: Payer: Self-pay | Admitting: Psychiatry

## 2018-01-25 DIAGNOSIS — R1310 Dysphagia, unspecified: Secondary | ICD-10-CM | POA: Diagnosis not present

## 2018-01-25 DIAGNOSIS — F251 Schizoaffective disorder, depressive type: Secondary | ICD-10-CM

## 2018-01-25 DIAGNOSIS — F4310 Post-traumatic stress disorder, unspecified: Secondary | ICD-10-CM

## 2018-01-25 DIAGNOSIS — F411 Generalized anxiety disorder: Secondary | ICD-10-CM

## 2018-01-26 MED ORDER — OLANZAPINE 7.5 MG PO TABS: 7.5000 mg | ORAL_TABLET | Freq: Every day | ORAL | 2 refills | Status: DC

## 2018-01-26 NOTE — Telephone Encounter (Signed)
Sent 60 days to pharmacy - zyprexa.

## 2018-01-30 ENCOUNTER — Ambulatory Visit: Admission: RE | Admit: 2018-01-30 | Payer: Medicare HMO | Source: Ambulatory Visit

## 2018-01-30 ENCOUNTER — Ambulatory Visit
Admission: RE | Admit: 2018-01-30 | Discharge: 2018-01-30 | Disposition: A | Discharge status: Home / Self Care | Payer: Medicare HMO | Source: Ambulatory Visit | Attending: Unknown Physician Specialty | Admitting: Unknown Physician Specialty

## 2018-01-30 ENCOUNTER — Encounter: Payer: Self-pay | Admitting: Psychiatry

## 2018-01-30 ENCOUNTER — Other Ambulatory Visit: Payer: Self-pay

## 2018-01-30 ENCOUNTER — Ambulatory Visit: Payer: Medicare HMO | Admitting: Psychiatry

## 2018-01-30 VITALS — BP 137/87 | HR 99 | Wt 260.2 lb

## 2018-01-30 DIAGNOSIS — G2119 Other drug induced secondary parkinsonism: Secondary | ICD-10-CM

## 2018-01-30 DIAGNOSIS — R1310 Dysphagia, unspecified: Secondary | ICD-10-CM | POA: Diagnosis not present

## 2018-01-30 DIAGNOSIS — F4310 Post-traumatic stress disorder, unspecified: Secondary | ICD-10-CM | POA: Diagnosis not present

## 2018-01-30 DIAGNOSIS — F251 Schizoaffective disorder, depressive type: Secondary | ICD-10-CM

## 2018-01-30 MED ORDER — DIPHENHYDRAMINE HCL 25 MG PO CAPS: 25.0000 mg | ORAL_CAPSULE | Freq: Every day | ORAL | 2 refills | Status: DC | PRN

## 2018-01-30 MED ORDER — MELATONIN 3 MG PO TABS: 3.0000 mg | ORAL_TABLET | Freq: Every day | ORAL | 0 refills | Status: DC

## 2018-01-30 NOTE — Patient Instructions (Signed)
Melatonin oral solid dosage forms  What is this medicine?  MELATONIN (mel uh TOH nin) is a dietary supplement. It is mostly promoted to help maintain normal sleep patterns. The FDA has not approved this supplement for any medical use.  This supplement may be used for other purposes; ask your health care provider or pharmacist if you have questions.  This medicine may be used for other purposes; ask your health care provider or pharmacist if you have questions.  COMMON BRAND NAME(S): Melatonex  What should I tell my health care provider before I take this medicine?  They need to know if you have any of these conditions:  -cancer  -depression or mental illness  -diabetes  -hormone problems  -if you often drink alcohol  -immune system problems  -liver disease  -lung or breathing disease, like asthma  -organ transplant  -seizure disorder  -an unusual or allergic reaction to melatonin, other medicines, foods, dyes, or preservatives  -pregnant or trying to get pregnant  -breast-feeding  How should I use this medicine?  Take this supplement by mouth with a glass of water. Do not take with food. This supplement is usually taken 1 or 2 hours before bedtime. After taking this supplement, limit your activities to those needed to prepare for bed. Some products may be chewed or dissolved in the mouth before swallowing. Some tablets or capsules must be swallowed whole; do not cut, crush or chew. Follow the directions on the package labeling, or take as directed by your health care professional. Do not take this supplement more often than directed.  Talk to your pediatrician regarding the use of this supplement in children. Special care may be needed. This supplement is not recommended for use in children without a prescription.  Overdosage: If you think you have taken too much of this medicine contact a poison control center or emergency room at once.  NOTE: This medicine is only for you. Do not share this medicine with  others.  What if I miss a dose?  If you miss taking your dose at the usual time, skip that dose. If it is almost time for your next dose, take only that dose. Do not take double or extra doses.  What may interact with this medicine?  Do not take this medicine with any of the following medications:  -fluvoxamine  -ramelteon  -tasimelteon  This medicine may also interact with the following medications:  -alcohol  -caffeine  -carbamazepine  -certain antibiotics like ciprofloxacin  -certain medicines for depression, anxiety, or psychotic disturbances  -cimetidine  -female hormones, like estrogens and birth control pills, patches, rings, or injections  -methoxsalen  -nifedipine  -other medications for sleep  -other herbal or dietary supplements  -phenobarbital  -rifampin  -smoking tobacco  -tamoxifen  -warfarin  This list may not describe all possible interactions. Give your health care provider a list of all the medicines, herbs, non-prescription drugs, or dietary supplements you use. Also tell them if you smoke, drink alcohol, or use illegal drugs. Some items may interact with your medicine.  What should I watch for while using this medicine?  See your doctor if your symptoms do not get better or if they get worse. Do not take this supplement for more than 2 weeks unless your doctor tells you to.  You may get drowsy or dizzy. Do not drive, use machinery, or do anything that needs mental alertness until you know how this medicine affects you. Do not stand or sit up   quickly, especially if you are an older patient. This reduces the risk of dizzy or fainting spells. Alcohol may interfere with the effect of this medicine. Avoid alcoholic drinks.  After taking this medicine, you may get up out of bed and do an activity that you do not know you are doing. The next morning, you may have no memory of this. Activities include driving a car ("sleep-driving"), making and eating food, talking on the phone, sexual activity, and  sleep-walking. Serious injuries have occurred. Call your doctor right away if you find out you have done any of these activities. Do not take this medicine if you have used alcohol that evening. Do not take it if you have taken another medicine for sleep. The risk of doing these sleep-related activities is higher.  Talk to your doctor before you use this supplement if you are currently being treated for an emotional, mental, or sleep problem. This medicine may interfere with your treatment.  Herbal or dietary supplements are not regulated like medicines. Rigid quality control standards are not required for dietary supplements. The purity and strength of these products can vary. The safety and effect of this dietary supplement for a certain disease or illness is not well known. This product is not intended to diagnose, treat, cure or prevent any disease.  The Food and Drug Administration suggests the following to help consumers protect themselves:  -Always read product labels and follow directions.  -Natural does not mean a product is safe for humans to take.  -Look for products that include USP after the ingredient name. This means that the manufacturer followed the standards of the US Pharmacopoeia.  -Supplements made or sold by a nationally known food or drug company are more likely to be made under tight controls. You can write to the company for more information about how the product was made.  What side effects may I notice from receiving this medicine?  Side effects that you should report to your doctor or health care professional as soon as possible:  -allergic reactions like skin rash, itching or hives, swelling of the face, lips, or tongue  -breathing problems  -confusion  -depressed mood, irritable, or other changes in moods or behaviors  -feeling faint or lightheaded, falls  -increased blood pressure  -irregular or missed menstrual periods  -signs and symptoms of liver injury like dark yellow or brown  urine; general ill feeling or flu-like symptoms; light-colored stools; loss of appetite; nausea; right upper belly pain; unusually weak or tired; yellowing of the eyes or skin  -trouble staying awake or alert during the day  -unusual activities while you are still asleep like driving, eating, making phone calls  -unusual bleeding or bruising  Side effects that usually do not require medical attention (report to your doctor or health care professional if they continue or are bothersome):  -dizziness  -drowsiness  -headache  -hot flashes  -nausea  -tiredness  -unusual dreams or nightmares  -upset stomach  This list may not describe all possible side effects. Call your doctor for medical advice about side effects. You may report side effects to FDA at 1-800-FDA-1088.  Where should I keep my medicine?  Keep out of the reach of children.  Store at room temperature or as directed on the package label. Protect from moisture. Throw away any unused supplement after the expiration date.  NOTE: This sheet is a summary. It may not cover all possible information. If you have questions about this medicine, talk to

## 2018-01-30 NOTE — Progress Notes (Signed)
[place] MD  OP Progress Note  01/30/2018 4:58 PM ELIZEBATH WEVER  MRN:  [id number]  Chief Complaint: ' I am here for follow up." Chief Complaint    Follow-up; Medication Refill     HPI: Nonie is a 57 year old Caucasian female, divorced, lives in [place], has a history of schizoaffective disorder, drug-induced Parkinson's disease, memory problems, history of trauma, migraine headaches, diabetes mellitus, presented to the clinic today for a follow-up visit.  Today reports her mood symptoms are stable on the Zyprexa.  She denies any perceptual disturbances.  She denies any significant depression or anxiety.  She continues to struggle with sleep.  She reports she was advised by her primary provider not to use the Ambien since she is currently on opioid pain medications.  Discussed with patient about starting melatonin as needed.  She agrees with plan.  She will buy OTC.  Patient continues to struggle with some side effects to the Zyprexa.  She has a diagnosis of drug-induced Parkinson's disease.  She did not tolerate or had side effects to propranolol, Artane, Cogentin previously.  She is currently on Requip prescribed per neurology.  She however reports there are periods when she feels her tremors on her left upper extremity is more.  She has been dropping objects when she feels that way.  Discussed trying Benadryl as needed.  She agrees with plan.  She looks forward to her Christmas holiday and plans to spend it with her daughter and granddaughter.   Visit Diagnosis:    ICD-10-CM   1. Schizoaffective disorder, depressive type ([place]) F25.1 Melatonin 3 MG TABS  2. PTSD (post-traumatic stress disorder) F43.10   3. Drug-induced Parkinson's disease ([place]) G21.19 diphenhydrAMINE (BENADRYL ALLERGY) 25 mg capsule    Past Psychiatric History: Reviewed past psychiatric history from my progress note on 05/25/2017.  Past Medical History:  Past Medical History:  Diagnosis Date  . Anxiety   .  Chronic kidney disease   . Chronic pain    previously saw Dr. Consuela Mimes in pain clinic, then saw pain specialist in [place]  . Depression   . Diabetes mellitus ([place])   . Frequency of urination   . GERD (gastroesophageal reflux disease)   . Headache(784.0)   . High cholesterol   . Hypertension   . IBS (irritable bowel syndrome)   . Left ankle instability   . Left knee DJD   . Lumbar Degenerative Disc Disease of  10/11/2014  . Neuromuscular disorder ([place])   . Osteoarthritis of hip (Right) 05/05/2015  . Other enthesopathy of ankle and tarsus 12/15/2009   Qualifier: Diagnosis of  By: Oneida Alar MD, KARL    . Parkinson's disease ([place])   . Peripheral sensory neuropathy (Bilateral) 11/19/2014  . Postoperative nausea and vomiting   . Schizophrenia [facility])     Past Surgical History:  Procedure Laterality Date  . ABDOMINAL HYSTERECTOMY    . ANKLE SURGERY    . APPENDECTOMY    . COLONOSCOPY  2013  . COLONOSCOPY WITH PROPOFOL N/A 04/25/2017   Procedure: COLONOSCOPY WITH PROPOFOL;  Surgeon: Manya Silvas, MD;  Location: [facility] ENDOSCOPY;  Service: Endoscopy;  Laterality: N/A;  . ESOPHAGOGASTRODUODENOSCOPY (EGD) WITH PROPOFOL N/A 10/03/2014   Procedure: ESOPHAGOGASTRODUODENOSCOPY (EGD) WITH PROPOFOL;  Surgeon: Josefine Class, MD;  Location: [facility] ENDOSCOPY;  Service: Endoscopy;  Laterality: N/A;  . ESOPHAGOGASTRODUODENOSCOPY (EGD) WITH PROPOFOL  04/25/2017   Procedure: ESOPHAGOGASTRODUODENOSCOPY (EGD) WITH PROPOFOL;  Surgeon: Manya Silvas, MD;  Location: [facility] ENDOSCOPY;  Service: Endoscopy;;  . KNEE  ARTHROSCOPY  1997   left knee  . LEFT HEART CATH AND CORONARY ANGIOGRAPHY Left 11/10/2017   Procedure: LEFT HEART CATH AND CORONARY ANGIOGRAPHY;  Surgeon: Isaias Cowman, MD;  Location: [place] CV LAB;  Service: Cardiovascular;  Laterality: Left;  . LEFT HEART CATHETERIZATION WITH CORONARY ANGIOGRAM N/A 01/11/2013   Procedure: LEFT HEART CATHETERIZATION WITH CORONARY ANGIOGRAM;  Surgeon:  Sinclair Grooms, MD;  Location: [facility] CATH LAB;  Service: Cardiovascular;  Laterality: N/A;  . TOTAL KNEE ARTHROPLASTY  08/30/2011   Procedure: TOTAL KNEE ARTHROPLASTY;  Surgeon: Lorn Junes, MD;  Location: [place];  Service: Orthopedics;  Laterality: Left;    Family Psychiatric History: Reviewed family psychiatric history from my progress note on 05/25/2017  Family History:  Family History  Problem Relation Age of Onset  . Cancer Mother   . Hypertension Father   . Heart disease Father   . Alcohol abuse Father     Social History: Reviewed social history from my progress note on 05/25/2017. Social History   Socioeconomic History  . Marital status: Divorced    Spouse name: Not on file  . Number of children: 2  . Years of education: 22  . Highest education level: 11th grade  Occupational History    Employer: [employer]  . Financial resource strain: Somewhat hard  . Food insecurity:    Worry: Never true    Inability: Never true  . Transportation needs:    Medical: No    Non-medical: No  Tobacco Use  . Smoking status: Former Smoker    Packs/day: 2.00    Years: 15.00    Pack years: 30.00    Types: Cigarettes    Last attempt to quit: 02/09/1995    Years since quitting: 22.9  . Smokeless tobacco: Never Used  Substance and Sexual Activity  . Alcohol use: No    Alcohol/week: 0.0 standard drinks  . Drug use: No  . Sexual activity: Not Currently  Lifestyle  . Physical activity:    Days per week: 0 days    Minutes per session: 0 min  . Stress: Rather much  Relationships  . Social connections:    Talks on phone: More than three times a week    Gets together: Once a week    Attends religious service: Never    Active member of club or organization: No    Attends meetings of clubs or organizations: Never    Relationship status: Divorced  Other Topics Concern  . Not on file  Social History Narrative   Patient lives at home alone. Patient works at [employer].   Caffeine daily- 2   Right handed.   Education- 11 th grade    Allergies:  Allergies  Allergen Reactions  . Prolixin Decanoate [Fluphenazine]   . Benztropine Other (See Comments)    Hair fall out  Suicide thoughts  . Buprenorphine Hcl Other (See Comments)    Unable to void  . Carbidopa-Levodopa Nausea Only  . Codeine Hives    REACTION: hives  . Duloxetine Other (See Comments)    Altered mental status Alopecia, visual hallucinations, nightmares  . Gabapentin Swelling  . Lyrica [Pregabalin] Other (See Comments)    Alopecia, visual hallucinations, nightmares Altered mental status  . Morphine And Related Other (See Comments)    Unable to void  . Nortripytline Hcl [Nortriptyline] Other (See Comments)    Hair loss and night mares   . Nsaids     REACTION:  palpitations, diaphoresis  . Penicillins Nausea And Vomiting and Other (See Comments)    REACTION: upset stomach Has patient had a PCN reaction causing immediate rash, facial/tongue/throat swelling, SOB or lightheadedness with hypotension: No Has patient had a PCN reaction causing severe rash involving mucus membranes or skin necrosis: No Has patient had a PCN reaction that required hospitalization: No Has patient had a PCN reaction occurring within the last 10 years: No If all of the above answers are "NO", then may proceed with Cephalosporin use.  . Tolmetin Other (See Comments)    REACTION: palpitations, diaphoresis  . Wellbutrin [Bupropion]     Constipation, mood swings    Metabolic Disorder Labs: Lab Results  Component Value Date   HGBA1C 6.2 (H) 03/06/2017   MPG 131.24 03/06/2017   MPG 108 05/17/2015   Lab Results  Component Value Date   PROLACTIN 64.2 (H) 03/06/2017   Lab Results  Component Value Date   CHOL 175 03/06/2017   TRIG 234 (H) 03/06/2017   HDL 69 03/06/2017   CHOLHDL 2.5 03/06/2017   VLDL 47 (H) 03/06/2017   LDLCALC 59 03/06/2017   LDLCALC 93 05/17/2015   Lab Results  Component  Value Date   TSH 2.766 08/24/2017   TSH 1.289 03/06/2017    Therapeutic Level Labs: No results found for: LITHIUM No results found for: VALPROATE No components found for:  CBMZ  Current Medications: Current Outpatient Medications  Medication Sig Dispense Refill  . acetaminophen (TYLENOL) 500 MG tablet Take 1 tablet (500 mg total) by mouth every 6 (six) hours as needed for mild pain. 30 tablet 0  . albuterol (PROVENTIL HFA;VENTOLIN HFA) 108 (90 Base) MCG/ACT inhaler Inhale 2 puffs into the lungs every 6 (six) hours as needed for wheezing or shortness of breath.    Marland Kitchen aspirin 81 MG tablet Take 81 mg by mouth daily.     . cetirizine (ZYRTEC) 10 MG tablet Take 10 mg by mouth daily.    . cholecalciferol (VITAMIN D) 1000 units tablet Take 1,000 Units by mouth daily.    . cyclobenzaprine (FLEXERIL) 10 MG tablet Take 10 mg by mouth 2 (two) times daily.     Marland Kitchen docusate sodium (COLACE) 100 MG capsule Take 100 mg by mouth 2 (two) times daily.     . furosemide (LASIX) 20 MG tablet Take 20 mg by mouth daily.     Marland Kitchen HYDROcodone-acetaminophen (NORCO/VICODIN) 5-325 MG tablet Take 0.5 tablets by mouth 2 (two) times daily. Every afternoon and at bedtime    . hydroxypropyl methylcellulose / hypromellose (ISOPTO TEARS / GONIOVISC) 2.5 % ophthalmic solution Place 1 drop into both eyes every 4 (four) hours as needed for dry eyes.    . metFORMIN (GLUCOPHAGE) 500 MG tablet Take 1 tablet (500 mg total) by mouth 2 (two) times daily with a meal. For diabetes management 10 tablet 0  . OLANZapine (ZYPREXA) 7.5 MG tablet Take 1 tablet (7.5 mg total) by mouth at bedtime. 60 tablet 2  . omeprazole (PRILOSEC) 40 MG capsule Take 40 mg by mouth daily as needed (heartburn).     . ondansetron (ZOFRAN) 4 MG tablet Take 4 mg by mouth every 8 (eight) hours as needed for nausea or vomiting.    . pravastatin (PRAVACHOL) 40 MG tablet Take 40 mg by mouth at bedtime.     . ranitidine (ZANTAC) 150 MG tablet Take 150 mg by mouth daily as  needed for heartburn.     . rizatriptan (MAXALT) 10 MG tablet Take  10 mg by mouth 2 (two) times daily. May repeat in 2 hours if needed     . senna (SENOKOT) 8.6 MG tablet Take 1-4 tablets (8.6-34.4 mg total) by mouth daily as needed for constipation. (Patient taking differently: Take 1 tablet by mouth at bedtime. )    . topiramate (TOPAMAX) 100 MG tablet Take 0.5 tablets (50 mg total) by mouth at bedtime. For mood control 30 tablet 1  . traMADol (ULTRAM) 50 MG tablet Take 2 tablets (100 mg total) by mouth every 8 (eight) hours as needed for moderate pain or severe pain. (Patient taking differently: Take 50-100 mg by mouth See admin instructions. 100 mg every morning, 50 mg every afternoon, and 50 mg at bedtime) 180 tablet 2  . verapamil (CALAN-SR) 120 MG CR tablet Take 1 tablet (120 mg total) by mouth at bedtime. For high blood pressure 90 tablet 1  . vitamin B-12 (CYANOCOBALAMIN) 1000 MCG tablet Take 1,000 mcg by mouth daily.    . diphenhydrAMINE (BENADRYL ALLERGY) 25 mg capsule Take 1 capsule (25 mg total) by mouth daily as needed. For side effects of tremors due to zyprexa 30 capsule 2  . Melatonin 3 MG TABS Take 1 tablet (3 mg total) by mouth at bedtime. TAKE IT 90 MINUTES PRIOR TO BEDTIME FOR SLEEP 100 tablet 0  . rOPINIRole (REQUIP XL) 2 MG 24 hr tablet Take by mouth.     No current facility-administered medications for this visit.      Musculoskeletal: Strength & Muscle Tone: within normal limits Gait & Station: walks with the help of a cane Patient leans: N/A  Psychiatric Specialty Exam: Review of Systems  Neurological: Positive for tremors.  Psychiatric/Behavioral: Negative for depression.  All other systems reviewed and are negative.   Blood pressure 137/87, pulse 99, weight 260 lb 3.2 oz (118 kg).Body mass index is 43.3 kg/m.  General Appearance: Casual  Eye Contact:  Fair  Speech:  Clear and Coherent  Volume:  Normal  Mood:  Euthymic  Affect:  Congruent  Thought Process:   Goal Directed and Descriptions of Associations: Intact  Orientation:  Full (Time, Place, and Person)  Thought Content: Logical   Suicidal Thoughts:  No  Homicidal Thoughts:  No  Memory:  Immediate;   Fair Recent;   Fair Remote;   Fair  Judgement:  Fair  Insight:  Fair  Psychomotor Activity:  Normal  Concentration:  Concentration: Fair and Attention Span: Fair  Recall:  [employer] of Knowledge: Fair  Language: Fair  Akathisia:  No  Handed:  Right  AIMS (if indicated): has tremors of BL UE , more on left side  Assets:  Communication Skills Desire for Improvement Social Support  ADL's:  Intact  Cognition: WNL  Sleep:  restless   Screenings: AIMS     Office Visit from 12/01/2017 in [facility] Office Visit from 09/20/2017 in [facility] Visit from 06/29/2017 in [place] Visit from 03/24/2017 in [facility] Admission (Discharged) from 03/03/2017 in [facility]  AIMS Total Score  1  0  6  10  0    AUDIT     Admission (Discharged) from 03/03/2017 in [facility]  Alcohol Use Disorder Identification Test Final Score (AUDIT)  0    GAD-7     Office Visit from 08/22/2015 in [place] at [facility]  Total GAD-7 Score  6  PHQ2-9     Office Visit from 03/23/2016 in [place] Office Visit from 08/22/2015 in [place] at [facility] from 08/06/2015 in [place] Office Visit from 06/10/2015 in [place] Clinical Support from 05/19/2015 in [facility]  PHQ-2 Total Score  0  1  0  0  0       Assessment and Plan: Lanique is a 58 year old Caucasian female who has a history of schizoaffective disorder, PTSD,  migraine headaches, diabetes mellitus, drug-induced Parkinson's disease, presented to the clinic today for a follow-up visit.  Patient today reports mood symptoms are stable on the Zyprexa.  She however continues to struggle with tremors as well as sleep problems.  She will continue to follow-up with neurology as well as her pain provider.  Discussed plan as noted below.  Plan Schizoaffective disorder Olanzapine 7.5 mg p.o. nightly  For PTSD No changes being made.  For drug-induced Parkinsonian disease She will continue Requip per neurology. Add Benadryl 25 mg as needed for severe EPS due to Zyprexa.  For insomnia Discontinue Ambien for noncompliance.   Start melatonin 3 mg p.o. nightly as needed  Follow-up in clinic in 2 months or sooner if needed  More than 50 % of the time was spent for psychoeducation and supportive psychotherapy and care coordination.  This note was generated in part or whole with voice recognition software. Voice recognition is usually quite accurate but there are transcription errors that can and very often do occur. I apologize for any typographical errors that were not detected and corrected.      Ursula Alert, MD 01/30/2018, 4:58 PM

## 2018-01-31 ENCOUNTER — Ambulatory Visit: Payer: Medicare HMO

## 2018-02-03 DIAGNOSIS — H3561 Retinal hemorrhage, right eye: Secondary | ICD-10-CM | POA: Diagnosis not present

## 2018-02-13 DIAGNOSIS — K219 Gastro-esophageal reflux disease without esophagitis: Secondary | ICD-10-CM | POA: Diagnosis not present

## 2018-02-13 DIAGNOSIS — R1314 Dysphagia, pharyngoesophageal phase: Secondary | ICD-10-CM | POA: Diagnosis not present

## 2018-02-14 ENCOUNTER — Other Ambulatory Visit: Payer: Self-pay | Admitting: Otolaryngology

## 2018-02-14 DIAGNOSIS — R1314 Dysphagia, pharyngoesophageal phase: Secondary | ICD-10-CM

## 2018-03-01 DIAGNOSIS — M25511 Pain in right shoulder: Secondary | ICD-10-CM | POA: Diagnosis not present

## 2018-03-01 DIAGNOSIS — E119 Type 2 diabetes mellitus without complications: Secondary | ICD-10-CM | POA: Diagnosis not present

## 2018-03-01 DIAGNOSIS — Z794 Long term (current) use of insulin: Secondary | ICD-10-CM | POA: Insufficient documentation

## 2018-03-06 DIAGNOSIS — H40023 Open angle with borderline findings, high risk, bilateral: Secondary | ICD-10-CM | POA: Diagnosis not present

## 2018-03-06 DIAGNOSIS — E119 Type 2 diabetes mellitus without complications: Secondary | ICD-10-CM | POA: Diagnosis not present

## 2018-03-13 ENCOUNTER — Ambulatory Visit
Admission: RE | Admit: 2018-03-13 | Discharge: 2018-03-13 | Disposition: A | Discharge status: Home / Self Care | Payer: Medicare HMO | Source: Ambulatory Visit | Attending: Otolaryngology | Admitting: Otolaryngology

## 2018-03-13 DIAGNOSIS — R1310 Dysphagia, unspecified: Secondary | ICD-10-CM | POA: Diagnosis not present

## 2018-03-13 DIAGNOSIS — R1314 Dysphagia, pharyngoesophageal phase: Secondary | ICD-10-CM | POA: Insufficient documentation

## 2018-03-13 DIAGNOSIS — G20 Parkinson's disease: Secondary | ICD-10-CM | POA: Diagnosis not present

## 2018-03-13 NOTE — Therapy (Signed)
[place], [place], [address] Phone: [phone]   Fax:     Modified Barium Swallow  Patient Details  Name: Heather Haley MRN: [id number] Date of Birth: [birth year] No data recorded  Encounter Date: 03/13/2018    Past Medical History:  Diagnosis Date  . Anxiety   . Chronic kidney disease   . Chronic pain    previously saw Dr. Consuela Mimes in pain clinic, then saw pain specialist in [place]  . Depression   . Diabetes mellitus ([place])   . Frequency of urination   . GERD (gastroesophageal reflux disease)   . Headache(784.0)   . High cholesterol   . Hypertension   . IBS (irritable bowel syndrome)   . Left ankle instability   . Left knee DJD   . Lumbar Degenerative Disc Disease of  10/11/2014  . Neuromuscular disorder ([place])   . Osteoarthritis of hip (Right) 05/05/2015  . Other enthesopathy of ankle and tarsus 12/15/2009   Qualifier: Diagnosis of  By: Oneida Alar MD, KARL    . Parkinson's disease ([place])   . Peripheral sensory neuropathy (Bilateral) 11/19/2014  . Postoperative nausea and vomiting   . Schizophrenia [facility])     Past Surgical History:  Procedure Laterality Date  . ABDOMINAL HYSTERECTOMY    . ANKLE SURGERY    . APPENDECTOMY    . COLONOSCOPY  2013  . COLONOSCOPY WITH PROPOFOL N/A 04/25/2017   Procedure: COLONOSCOPY WITH PROPOFOL;  Surgeon: Manya Silvas, MD;  Location[facility] ENDOSCOPY;  Service: Endoscopy;  Laterality: N/A;  . ESOPHAGOGASTRODUODENOSCOPY (EGD) WITH PROPOFOL N/A 10/03/2014   Procedure: ESOPHAGOGASTRODUODENOSCOPY (EGD) WITH PROPOFOL;  Surgeon: Josefine Class, MD;  Location: [facility] ENDOSCOPY;  Service: Endoscopy;  Laterality: N/A;  . ESOPHAGOGASTRODUODENOSCOPY (EGD) WITH PROPOFOL  04/25/2017   Procedure: ESOPHAGOGASTRODUODENOSCOPY (EGD) WITH PROPOFOL;  Surgeon: Manya Silvas, MD;  Location: [facility] ENDOSCOPY;  Service: Endoscopy;;  . KNEE ARTHROSCOPY  1997   left knee  .  LEFT HEART CATH AND CORONARY ANGIOGRAPHY Left 11/10/2017   Procedure: LEFT HEART CATH AND CORONARY ANGIOGRAPHY;  Surgeon: Isaias Cowman, MD;  Location: [place] CV LAB;  Service: Cardiovascular;  Laterality: Left;  . LEFT HEART CATHETERIZATION WITH CORONARY ANGIOGRAM N/A 01/11/2013   Procedure: LEFT HEART CATHETERIZATION WITH CORONARY ANGIOGRAM;  Surgeon: Sinclair Grooms, MD;  Location[facility] CATH LAB;  Service: Cardiovascular;  Laterality: N/A;  . TOTAL KNEE ARTHROPLASTY  08/30/2011   Procedure: TOTAL KNEE ARTHROPLASTY;  Surgeon: Lorn Junes, MD;  Location: [facility];  Service: Orthopedics;  Laterality: Left;    There were no vitals filed for this visit.   Subjective: Patient behavior: (alertness, ability to follow instructions, etc.): The patient is alert, able to express her swallowing concerns, and follow directions.  Chief complaint: 58 year old woman with drug induced Parkinsonian features and self-report of "small food and pills" getting stuck   Objective:  Radiological Procedure: A videoflouroscopic evaluation of oral-preparatory, reflex initiation, and pharyngeal phases of the swallow was performed; as well as a screening of the upper esophageal phase.  I. POSTURE: Upright in MBS chair  II. VIEW: lateral  III. COMPENSATORY STRATEGIES: N/A  IV. BOLUSES ADMINISTERED:   Thin Liquid: 1 small, 3 rapid consecutive   Nectar-thick Liquid: 1 moderate   Honey-thick Liquid: DNT   Puree: 2 teaspoon presentations   Mechanical Soft: 1/4 graham cracker in applesauce   Barium tablet  V. RESULTS OF EVALUATION: A. ORAL PREPARATORY PHASE: (The lips, tongue, and  velum are observed for strength and coordination)       **Overall Severity Rating: within normal limits   B. SWALLOW INITIATION/REFLEX: (The reflex is normal if "triggered" by the time the bolus reached the base of the tongue)  **Overall Severity Rating: Mild; triggers while falling from the valleculae to the pyriform  sinuses  C. PHARYNGEAL PHASE: (Pharyngeal function is normal if the bolus shows rapid, smooth, and continuous transit through the pharynx and there is no pharyngeal residue after the swallow)  **Overall Severity Rating: Mild; minimally reduced hyolaryngeal excursion, tongue base retraction, and partial epiglottic inversion with tracte vallecular residue  D. LARYNGEAL PENETRATION: (Material entering into the laryngeal inlet/vestibule but not aspirated) NONE  E. ASPIRATION: NONE  F. ESOPHAGEAL PHASE: (Screening of the upper esophagus) no abnormality within the viewable esophagus  ASSESSMENT: This 58 year old woman; with drug induced Parkinsonian features and self-report of "small food and pills" getting stuck; is presenting with minimal oropharyngeal dysphagia characterized by delayed pharyngeal swallow initiation, minimally reduced pharyngeal pressure generation, and trace vallecular residue post swallow.  Oral control of the bolus including oral hold, rotary mastication, and anterior to posterior transfer is within functional limits (slowed oral management, but no other concerns).   There is no observed laryngeal penetration or tracheal aspiration.  The patient is not at significant risk for prandial aspiration.  The patient was able to swallow a barium tablet with rapid transit through the pharynx and viewable esophagus.  PLAN/RECOMMENDATIONS:   A. Diet: Regular   B. Swallowing Precautions: Be sure to drink plenty of water with medication, soften and moisten foods as needed   C. Recommended consultation to: follow up with MDs as recommended   D. Therapy recommendations: speech therapy is not indicated   E. Results and recommendations were discussed with the patient immediately following the study and the final report routed to the referring MD.     Dysphagia, pharyngoesophageal - Plan: DG SWALLOW FUNC W VID CINE [place] NECK DELAYED IMAGE WITH [place] MEDICARE, DG SWALLOW FUNC W VID CINE [place] NECK  DELAYED IMAGE WITH [place] MEDICARE        Problem List Patient Active Problem List   Diagnosis Date Noted  . S/P cardiac catheterization 11/18/2017  . Heart palpitations 10/03/2017  . Asthma, stable, moderate persistent 09/23/2017  . Weight gain due to medication 08/27/2017  . MDD (major depressive disorder), recurrent, severe, with psychosis ([place]) 03/03/2017  . Postmenopausal 10/15/2016  . CKD (chronic kidney disease) stage 2, GFR 60-89 ml/min 10/14/2016  . Muscle spasms of neck 10/14/2016  . Unintended weight gain 10/14/2016  . Contracture, tendon sheath 03/23/2016  . Medication overuse headache 11/12/2015  . Tremor 11/12/2015  . Dysuria 06/17/2015  . Pure hypercholesterolemia 05/29/2015  . Opioid-induced constipation (OIC) 05/19/2015  . Osteoarthritis of hip (Right) 05/08/2015  . Chronic hip pain (Right) 05/05/2015  . Long term prescription opiate use 05/05/2015  . Chronic knee pain (S/P TKR:Total Knee Replacement) (Left) 05/05/2015  . Osteoarthritis of hips (Location of Secondary source of pain) (Bilateral) (Right) 05/05/2015  . History of total knee replacement (Left) 05/05/2015  . Chronic groin pain (Location of Secondary source of pain) (Right) 05/05/2015  . Chronic lower extremity pain (Location of Tertiary source of pain) (Bilateral) (R>L) 05/05/2015  . Chronic pain 02/05/2015  . Chronic ankle pain (secondary to a work-related injury) (Date of injury 04/17/2006) (Left) 12/25/2014  . Chronic low back pain (Location of Primary Pain) (Bilateral) (R>L) 12/25/2014  . Lumbar spondylosis (L3-4 & L4-5) 12/25/2014  .  Lumbar facet syndrome (Location of Primary Source of Pain) (Bilateral) (R>L) 12/25/2014  . Encounter for therapeutic drug level monitoring 11/19/2014  . Encounter for long-term opiate analgesic use 11/19/2014  . Long-term current use of opiate analgesic 11/19/2014  . Uncomplicated opioid dependence ([place]) 11/19/2014  . Opiate use (35 MME/Day) 11/19/2014  . Chronic  pain syndrome 11/19/2014  . Diabetic sensory peripheral neuropathy (Bilateral Lower Extremity) 11/19/2014  . Generalized anxiety disorder 11/19/2014  . History of panic attacks 11/19/2014  . History of tobacco abuse 11/19/2014  . GERD (gastroesophageal reflux disease) 11/19/2014  . Non-insulin dependent type 2 diabetes mellitus ([place]) 11/19/2014  . Coccygeal pain 11/19/2014  . History of migraine 11/19/2014  . Chronic constipation 10/24/2013  . Personal history of other diseases of the digestive system 10/24/2013  . History of gastroesophageal reflux (GERD) 10/24/2013  . Leg length inequality 09/12/2012  . Morbid obesity ([place]) 05/26/2012  . Hypertension   . IBS (irritable bowel syndrome)   . Depression, major, recurrent, moderate ([place])   . ADVERSE DRUG REACTION 12/15/2009   Leroy Sea, MS/CCC- SLP  Lou Miner 03/13/2018, 1:20 PM  [facility] [place], [place], [address] Phone: [phone]   Fax:     Name: Heather Haley MRN: [id number] Date of Birth: 09-05-1960

## 2018-03-16 DIAGNOSIS — M5481 Occipital neuralgia: Secondary | ICD-10-CM | POA: Diagnosis not present

## 2018-03-16 DIAGNOSIS — M542 Cervicalgia: Secondary | ICD-10-CM | POA: Diagnosis not present

## 2018-03-17 DIAGNOSIS — M542 Cervicalgia: Secondary | ICD-10-CM | POA: Diagnosis not present

## 2018-03-17 DIAGNOSIS — M5481 Occipital neuralgia: Secondary | ICD-10-CM | POA: Diagnosis not present

## 2018-03-20 DIAGNOSIS — K219 Gastro-esophageal reflux disease without esophagitis: Secondary | ICD-10-CM | POA: Diagnosis not present

## 2018-03-20 DIAGNOSIS — R682 Dry mouth, unspecified: Secondary | ICD-10-CM | POA: Diagnosis not present

## 2018-03-28 DIAGNOSIS — I10 Essential (primary) hypertension: Secondary | ICD-10-CM | POA: Diagnosis not present

## 2018-03-28 DIAGNOSIS — E119 Type 2 diabetes mellitus without complications: Secondary | ICD-10-CM | POA: Diagnosis not present

## 2018-04-03 ENCOUNTER — Ambulatory Visit: Payer: Medicare HMO | Admitting: Psychiatry

## 2018-04-03 ENCOUNTER — Other Ambulatory Visit: Payer: Self-pay

## 2018-04-03 ENCOUNTER — Encounter: Payer: Self-pay | Admitting: Psychiatry

## 2018-04-03 VITALS — BP 147/98 | HR 98 | Temp 97.8°F | Wt 258.0 lb

## 2018-04-03 DIAGNOSIS — G2119 Other drug induced secondary parkinsonism: Secondary | ICD-10-CM | POA: Diagnosis not present

## 2018-04-03 DIAGNOSIS — F411 Generalized anxiety disorder: Secondary | ICD-10-CM | POA: Diagnosis not present

## 2018-04-03 DIAGNOSIS — F4310 Post-traumatic stress disorder, unspecified: Secondary | ICD-10-CM

## 2018-04-03 DIAGNOSIS — F5105 Insomnia due to other mental disorder: Secondary | ICD-10-CM

## 2018-04-03 DIAGNOSIS — F251 Schizoaffective disorder, depressive type: Secondary | ICD-10-CM | POA: Diagnosis not present

## 2018-04-03 MED ORDER — OLANZAPINE 7.5 MG PO TABS: 7.5000 mg | ORAL_TABLET | Freq: Every day | ORAL | 2 refills | Status: DC

## 2018-04-03 MED ORDER — DOXEPIN HCL 10 MG PO CAPS: 10.0000 mg | ORAL_CAPSULE | Freq: Every day | ORAL | 2 refills | Status: DC

## 2018-04-03 NOTE — Progress Notes (Signed)
[place] MD OP Progress Note  04/03/2018 5:27 PM Heather Haley  MRN:  [id number]  Chief Complaint: ' I am here for follow up." Chief Complaint    Follow-up     HPI: Heather Haley is a 58 year old Caucasian female, divorced, lives in [place], has a history of schizoaffective disorder, drug-induced Parkinson's disease, memory problems, history of trauma, migraine headaches, diabetes mellitus, chronic pain, presented to clinic today for a follow-up visit.  Patient today returns reporting worsening sleep problems.  She reports she could not take the Ambien anymore since she was advised by her pain provider not to take it.  Patient has tried several other medications previously including trazodone, Pamelor.  Patient reports she is on melatonin which does not help much.  Discussed doxepin.  She agrees with plan.  She continues to work with her neurologist for her drug-induced parkinsonism as well as headaches.  Patient denies any suicidality, perceptual disturbances.  Patient denies any homicidality.  Patient denies any other concerns today. Visit Diagnosis:    ICD-10-CM   1. Schizoaffective disorder, depressive type ([place]) F25.1 OLANZapine (ZYPREXA) 7.5 MG tablet    doxepin (SINEQUAN) 10 MG capsule  2. PTSD (post-traumatic stress disorder) F43.10 OLANZapine (ZYPREXA) 7.5 MG tablet  3. Generalized anxiety disorder F41.1 OLANZapine (ZYPREXA) 7.5 MG tablet  4. Insomnia due to mental disorder F51.05   5. Drug-induced Parkinson's disease ([place]) G21.19     Past Psychiatric History: I have reviewed past psychiatric history from my progress note on 05/25/2017.  Past Medical History:  Past Medical History:  Diagnosis Date  . Anxiety   . Chronic kidney disease   . Chronic pain    previously saw Dr. Consuela Mimes in pain clinic, then saw pain specialist in [place]  . Depression   . Diabetes mellitus ([place])   . Frequency of urination   . GERD (gastroesophageal reflux disease)   . Headache(784.0)   .  High cholesterol   . Hypertension   . IBS (irritable bowel syndrome)   . Left ankle instability   . Left knee DJD   . Lumbar Degenerative Disc Disease of  10/11/2014  . Neuromuscular disorder ([place])   . Osteoarthritis of hip (Right) 05/05/2015  . Other enthesopathy of ankle and tarsus 12/15/2009   Qualifier: Diagnosis of  By: Oneida Alar MD, KARL    . Parkinson's disease ([place])   . Peripheral sensory neuropathy (Bilateral) 11/19/2014  . Postoperative nausea and vomiting   . Schizophrenia [facility])     Past Surgical History:  Procedure Laterality Date  . ABDOMINAL HYSTERECTOMY    . ANKLE SURGERY    . APPENDECTOMY    . COLONOSCOPY  2013  . COLONOSCOPY WITH PROPOFOL N/A 04/25/2017   Procedure: COLONOSCOPY WITH PROPOFOL;  Surgeon: Manya Silvas, MD;  Location[facility] ENDOSCOPY;  Service: Endoscopy;  Laterality: N/A;  . ESOPHAGOGASTRODUODENOSCOPY (EGD) WITH PROPOFOL N/A 10/03/2014   Procedure: ESOPHAGOGASTRODUODENOSCOPY (EGD) WITH PROPOFOL;  Surgeon: Josefine Class, MD;  Location: [facility] ENDOSCOPY;  Service: Endoscopy;  Laterality: N/A;  . ESOPHAGOGASTRODUODENOSCOPY (EGD) WITH PROPOFOL  04/25/2017   Procedure: ESOPHAGOGASTRODUODENOSCOPY (EGD) WITH PROPOFOL;  Surgeon: Manya Silvas, MD;  Location: [facility] ENDOSCOPY;  Service: Endoscopy;;  . KNEE ARTHROSCOPY  1997   left knee  . LEFT HEART CATH AND CORONARY ANGIOGRAPHY Left 11/10/2017   Procedure: LEFT HEART CATH AND CORONARY ANGIOGRAPHY;  Surgeon: Isaias Cowman, MD;  Location: [facility] CV LAB;  Service: Cardiovascular;  Laterality: Left;  . LEFT HEART CATHETERIZATION WITH CORONARY ANGIOGRAM N/A 01/11/2013  Procedure: LEFT HEART CATHETERIZATION WITH CORONARY ANGIOGRAM;  Surgeon: Sinclair Grooms, MD;  Location: [facility] CATH LAB;  Service: Cardiovascular;  Laterality: N/A;  . TOTAL KNEE ARTHROPLASTY  08/30/2011   Procedure: TOTAL KNEE ARTHROPLASTY;  Surgeon: Lorn Junes, MD;  Location: [place];  Service: Orthopedics;  Laterality: Left;     Family Psychiatric History: Reviewed family psychiatric history from my progress note on 05/25/2017.  Family History:  Family History  Problem Relation Age of Onset  . Cancer Mother   . Hypertension Father   . Heart disease Father   . Alcohol abuse Father     Social History: Reviewed social history from my progress note on 05/25/2017. Social History   Socioeconomic History  . Marital status: Divorced    Spouse name: Not on file  . Number of children: 2  . Years of education: 63  . Highest education level: 11th grade  Occupational History    Employer: [employer]  . Financial resource strain: Somewhat hard  . Food insecurity:    Worry: Never true    Inability: Never true  . Transportation needs:    Medical: No    Non-medical: No  Tobacco Use  . Smoking status: Former Smoker    Packs/day: 2.00    Years: 15.00    Pack years: 30.00    Types: Cigarettes    Last attempt to quit: 02/09/1995    Years since quitting: 23.1  . Smokeless tobacco: Never Used  Substance and Sexual Activity  . Alcohol use: No    Alcohol/week: 0.0 standard drinks  . Drug use: No  . Sexual activity: Not Currently  Lifestyle  . Physical activity:    Days per week: 0 days    Minutes per session: 0 min  . Stress: Rather much  Relationships  . Social connections:    Talks on phone: More than three times a week    Gets together: Once a week    Attends religious service: Never    Active member of club or organization: No    Attends meetings of clubs or organizations: Never    Relationship status: Divorced  Other Topics Concern  . Not on file  Social History Narrative   Patient lives at home alone. Patient works at [employer].   Caffeine daily- 2   Right handed.   Education- 11 th grade    Allergies:  Allergies  Allergen Reactions  . Prolixin Decanoate [Fluphenazine]   . Benztropine Other (See Comments)    Hair fall out  Suicide thoughts  . Buprenorphine Hcl  Other (See Comments)    Unable to void  . Carbidopa-Levodopa Nausea Only  . Codeine Hives    REACTION: hives  . Duloxetine Other (See Comments)    Altered mental status Alopecia, visual hallucinations, nightmares  . Gabapentin Swelling  . Lyrica [Pregabalin] Other (See Comments)    Alopecia, visual hallucinations, nightmares Altered mental status  . Morphine And Related Other (See Comments)    Unable to void  . Nortripytline Hcl [Nortriptyline] Other (See Comments)    Hair loss and night mares   . Nsaids     REACTION: palpitations, diaphoresis  . Penicillins Nausea And Vomiting and Other (See Comments)    REACTION: upset stomach Has patient had a PCN reaction causing immediate rash, facial/tongue/throat swelling, SOB or lightheadedness with hypotension: No Has patient had a PCN reaction causing severe rash involving mucus membranes or skin necrosis: No Has patient had a  PCN reaction that required hospitalization: No Has patient had a PCN reaction occurring within the last 10 years: No If all of the above answers are "NO", then may proceed with Cephalosporin use.  . Tolmetin Other (See Comments)    REACTION: palpitations, diaphoresis  . Wellbutrin [Bupropion]     Constipation, mood swings    Metabolic Disorder Labs: Lab Results  Component Value Date   HGBA1C 6.2 (H) 03/06/2017   MPG 131.24 03/06/2017   MPG 108 05/17/2015   Lab Results  Component Value Date   PROLACTIN 64.2 (H) 03/06/2017   Lab Results  Component Value Date   CHOL 175 03/06/2017   TRIG 234 (H) 03/06/2017   HDL 69 03/06/2017   CHOLHDL 2.5 03/06/2017   VLDL 47 (H) 03/06/2017   LDLCALC 59 03/06/2017   LDLCALC 93 05/17/2015   Lab Results  Component Value Date   TSH 2.766 08/24/2017   TSH 1.289 03/06/2017    Therapeutic Level Labs: No results found for: LITHIUM No results found for: VALPROATE No components found for:  CBMZ  Current Medications: Current Outpatient Medications  Medication Sig  Dispense Refill  . acetaminophen (TYLENOL) 500 MG tablet Take 1 tablet (500 mg total) by mouth every 6 (six) hours as needed for mild pain. 30 tablet 0  . albuterol (PROVENTIL HFA;VENTOLIN HFA) 108 (90 Base) MCG/ACT inhaler Inhale 2 puffs into the lungs every 6 (six) hours as needed for wheezing or shortness of breath.    Marland Kitchen aspirin 81 MG tablet Take 81 mg by mouth daily.     . cetirizine (ZYRTEC) 10 MG tablet Take 10 mg by mouth daily.    . cholecalciferol (VITAMIN D) 1000 units tablet Take 1,000 Units by mouth daily.    . cyclobenzaprine (FLEXERIL) 10 MG tablet Take 10 mg by mouth 2 (two) times daily.     . diphenhydrAMINE (BENADRYL ALLERGY) 25 mg capsule Take 1 capsule (25 mg total) by mouth daily as needed. For side effects of tremors due to zyprexa 30 capsule 2  . docusate sodium (COLACE) 100 MG capsule Take 100 mg by mouth 2 (two) times daily.     . furosemide (LASIX) 20 MG tablet Take 20 mg by mouth daily.     Marland Kitchen glimepiride (AMARYL) 2 MG tablet Take by mouth.    Marland Kitchen HYDROcodone-acetaminophen (NORCO/VICODIN) 5-325 MG tablet Take 0.5 tablets by mouth 2 (two) times daily. Every afternoon and at bedtime    . hydroxypropyl methylcellulose / hypromellose (ISOPTO TEARS / GONIOVISC) 2.5 % ophthalmic solution Place 1 drop into both eyes every 4 (four) hours as needed for dry eyes.    . Melatonin 3 MG TABS Take 1 tablet (3 mg total) by mouth at bedtime. TAKE IT 90 MINUTES PRIOR TO BEDTIME FOR SLEEP 100 tablet 0  . metFORMIN (GLUCOPHAGE) 500 MG tablet Take 1 tablet (500 mg total) by mouth 2 (two) times daily with a meal. For diabetes management 10 tablet 0  . OLANZapine (ZYPREXA) 7.5 MG tablet Take 1 tablet (7.5 mg total) by mouth at bedtime. 30 tablet 2  . omeprazole (PRILOSEC) 40 MG capsule Take 40 mg by mouth daily as needed (heartburn).     . ondansetron (ZOFRAN) 4 MG tablet Take 4 mg by mouth every 8 (eight) hours as needed for nausea or vomiting.    . pravastatin (PRAVACHOL) 40 MG tablet Take 40 mg  by mouth at bedtime.     . ranitidine (ZANTAC) 150 MG tablet Take 150 mg by mouth daily  as needed for heartburn.     . rizatriptan (MAXALT) 10 MG tablet Take 10 mg by mouth 2 (two) times daily. May repeat in 2 hours if needed     . senna (SENOKOT) 8.6 MG tablet Take 1-4 tablets (8.6-34.4 mg total) by mouth daily as needed for constipation. (Patient taking differently: Take 1 tablet by mouth at bedtime. )    . topiramate (TOPAMAX) 100 MG tablet Take 0.5 tablets (50 mg total) by mouth at bedtime. For mood control 30 tablet 1  . traMADol (ULTRAM) 50 MG tablet Take 2 tablets (100 mg total) by mouth every 8 (eight) hours as needed for moderate pain or severe pain. (Patient taking differently: Take 50-100 mg by mouth See admin instructions. 100 mg every morning, 50 mg every afternoon, and 50 mg at bedtime) 180 tablet 2  . verapamil (CALAN-SR) 120 MG CR tablet Take 1 tablet (120 mg total) by mouth at bedtime. For high blood pressure 90 tablet 1  . vitamin B-12 (CYANOCOBALAMIN) 1000 MCG tablet Take 1,000 mcg by mouth daily.    Marland Kitchen doxepin (SINEQUAN) 10 MG capsule Take 1 capsule (10 mg total) by mouth at bedtime. Sleep and mood 30 capsule 2  . rOPINIRole (REQUIP XL) 2 MG 24 hr tablet Take by mouth.     No current facility-administered medications for this visit.      Musculoskeletal: Strength & Muscle Tone: wnl Gait & Station: walks with cane Patient leans: N/A  Psychiatric Specialty Exam: Review of Systems  Psychiatric/Behavioral: The patient is nervous/anxious and has insomnia.   All other systems reviewed and are negative.   Blood pressure (!) 147/98, pulse 98, temperature 97.8 F (36.6 C), temperature source Oral, weight 258 lb (117 kg).Body mass index is 42.93 kg/m.  General Appearance: Casual  Eye Contact:  Fair  Speech:  Clear and Coherent  Volume:  Normal  Mood:  Anxious  Affect:  Appropriate  Thought Process:  Goal Directed and Descriptions of Associations: Intact  Orientation:  Full  (Time, Place, and Person)  Thought Content: Logical   Suicidal Thoughts:  No  Homicidal Thoughts:  No  Memory:  Immediate;   Fair Recent;   Fair Remote;   Fair  Judgement:  Fair  Insight:  Fair  Psychomotor Activity:  Normal  Concentration:  Concentration: Fair and Attention Span: Fair  Recall:  [employer] of Knowledge: Fair  Language: Fair  Akathisia:  No  Handed:  Right  AIMS (if indicated): has tremors, chronic - BL  UE   Assets:  Communication Skills Desire for Improvement Social Support  ADL's:  Intact  Cognition: WNL  Sleep:  Poor   Screenings: AIMS     Office Visit from 12/01/2017 in [facility] Office Visit from 09/20/2017 in [place] Office Visit from 06/29/2017 in [place] Visit from 03/24/2017 in [place] Admission (Discharged) from 03/03/2017 in [facility]  AIMS Total Score  1  0  6  10  0    AUDIT     Admission (Discharged) from 03/03/2017 in [facility] [place] [facility]  Alcohol Use Disorder Identification Test Final Score (AUDIT)  0    GAD-7     Office Visit from 08/22/2015 in [place] at [facility]  Total GAD-7 Score  6    PHQ2-9     Office Visit from 03/23/2016 in [place] Office Visit from 08/22/2015 in  Therapist, music at [place] from 08/06/2015 in [place] Office Visit from 06/10/2015 in [place] Clinical Support from 05/19/2015 in [facility]  PHQ-2 Total Score  0  1  0  0  0       Assessment and Plan: Demri is a 58 yr old Caucasian female who has a history of schizoaffective disorder, PTSD, migraine headaches, diabetes melitis, drug-induced Parkinson's disease, presented to clinic today  for a follow-up visit.  Patient continues to struggle with sleep problems.  We will continue to make medication changes  Plan Schizoaffective disorder- improving Zyprexa 7.5 mg p.o. nightly  For PTSD-improving Zyprexa as prescribed.  Insomnia-unstable Start doxepin 10 mg p.o. nightly  For drug-induced Parkinson's disease- improving She will continue Requip per neurology.  Follow-up in clinic in 4 weeks or sooner if needed.   I have spent atleast 15 minutes face to face with patient today. More than 50 % of the time was spent for psychoeducation and supportive psychotherapy and care coordination.  This note was generated in part or whole with voice recognition software. Voice recognition is usually quite accurate but there are transcription errors that can and very often do occur. I apologize for any typographical errors that were not detected and corrected.       Ursula Alert, MD 04/03/2018, 5:27 PM

## 2018-04-03 NOTE — Patient Instructions (Signed)
Doxepin capsules What is this medicine? DOXEPIN (DOX e pin) is used to treat depression and anxiety. This medicine may be used for other purposes; ask your health care provider or pharmacist if you have questions. COMMON BRAND NAME(S): Sinequan What should I tell my health care provider before I take this medicine? They need to know if you have any of these conditions: -bipolar disorder -difficulty passing urine -glaucoma -heart disease -if you frequently drink alcohol containing drinks -liver disease -lung or breathing disease, like asthma or sleep apnea -prostate trouble -schizophrenia -seizures -suicidal thoughts, plans, or attempt; a previous suicide attempt by you or a family member -an unusual or allergic reaction to doxepin, other medicines, foods, dyes, or preservatives -pregnant or trying to get pregnant -breast-feeding How should I use this medicine? Take this medicine by mouth with a glass of water. Follow the directions on the prescription label. Take your doses at regular intervals. Do not take your medicine more often than directed. Do not stop taking this medicine suddenly except upon the advice of your doctor. Stopping this medicine too quickly may cause serious side effects or your condition may worsen. A special MedGuide will be given to you by the pharmacist with each prescription and refill. Be sure to read this information carefully each time. Talk to your pediatrician regarding the use of this medicine in children. While this drug may be prescribed for children as young as 12 years for selected conditions, precautions do apply. Overdosage: If you think you have taken too much of this medicine contact a poison control center or emergency room at once. NOTE: This medicine is only for you. Do not share this medicine with others. What if I miss a dose? If you miss a dose, take it as soon as you can. If it is almost time for your next dose, take only that dose. Do not  take double or extra doses. What may interact with this medicine? Do not take this medicine with any of the following medications: -arsenic trioxide -certain medicines used to regulate abnormal heartbeat or to treat other heart conditions -cisapride -halofantrine -levomethadyl -linezolid -MAOIs like Carbex, Eldepryl, Marplan, Nardil, and Parnate -methylene blue -other medicines for mental depression -phenothiazines like perphenazine, thioridazine and chlorpromazine -pimozide -procarbazine -sparfloxacin -St. John's Wort -ziprasidone This medicine may also interact with the following medications: -cimetidine -tolazamide This list may not describe all possible interactions. Give your health care provider a list of all the medicines, herbs, non-prescription drugs, or dietary supplements you use. Also tell them if you smoke, drink alcohol, or use illegal drugs. Some items may interact with your medicine. What should I watch for while using this medicine? Visit your doctor or health care professional for regular checks on your progress. It can take several days before you feel the full effect of this medicine. If you have been taking this medicine regularly for some time, do not suddenly stop taking it. You must gradually reduce the dose or you may get severe side effects. Ask your doctor or health care professional for advice. Even after you stop taking this medicine it can still affect your body for several days. Patients and their families should watch out for new or worsening thoughts of suicide or depression. Also watch out for sudden changes in feelings such as feeling anxious, agitated, panicky, irritable, hostile, aggressive, impulsive, severely restless, overly excited and hyperactive, or not being able to sleep. If this happens, especially at the beginning of treatment or after a change in dose,   or worsening thoughts of suicide or depression. Also watch out for sudden changes in feelings such as feeling anxious, agitated, panicky, irritable, hostile, aggressive, impulsive, severely restless, overly excited and hyperactive, or not being able to sleep. If this happens, especially at the beginning of treatment or after a change in dose, call your health care professional.  You may get drowsy or dizzy. Do not drive, use machinery,  or do anything that needs mental alertness until you know how this medicine affects you. Do not stand or sit up quickly, especially if you are an older patient. This reduces the risk of dizzy or fainting spells. Alcohol may increase dizziness and drowsiness. Avoid alcoholic drinks.  Do not treat yourself for coughs, colds, or allergies without asking your doctor or health care professional for advice. Some ingredients can increase possible side effects.  Your mouth may get dry. Chewing sugarless gum or sucking hard candy, and drinking plenty of water may help. Contact your doctor if the problem does not go away or is severe.  This medicine may cause dry eyes and blurred vision. If you wear contact lenses you may feel some discomfort. Lubricating drops may help. See your eye doctor if the problem does not go away or is severe.  This medicine can make you more sensitive to the sun. Keep out of the sun. If you cannot avoid being in the sun, wear protective clothing and use sunscreen. Do not use sun lamps or tanning beds/booths.  What side effects may I notice from receiving this medicine?  Side effects that you should report to your doctor or health care professional as soon as possible:  -allergic reactions like skin rash, itching or hives, swelling of the face, lips, or tongue  -anxious  -breathing problems  -changes in vision  -confusion  -elevated mood, decreased need for sleep, racing thoughts, impulsive behavior  -eye pain  -fast, irregular heartbeat  -feeling faint or lightheaded, falls  -feeling agitated, angry, or irritable  -fever with increased sweating  -hallucination, loss of contact with reality  -seizures  -stiff muscles  -suicidal thoughts or other mood changes  -tingling, pain, or numbness in the feet or hands  -trouble passing urine or change in the amount of urine  -trouble sleeping  -unusually weak or tired  -vomiting  -yellowing of the eyes or skin  Side effects that usually do not require medical  attention (report to your doctor or health care professional if they continue or are bothersome):  -change in sex drive or performance  -change in appetite or weight  -constipation  -dizziness  -dry mouth  -nausea  -tired  -tremors  -upset stomach  This list may not describe all possible side effects. Call your doctor for medical advice about side effects. You may report side effects to FDA at 1-800-FDA-1088.  Where should I keep my medicine?  Keep out of the reach of children.  Store at room temperature between 15 and 30 degrees C (59 and 86 degrees F). Throw away any unused medicine after the expiration date.  NOTE: This sheet is a summary. It may not cover all possible information. If you have questions about this medicine, talk to your doctor, pharmacist, or health care provider.  © 2019 [employer] (2017-07-21 11:15:31)

## 2018-04-04 DIAGNOSIS — R413 Other amnesia: Secondary | ICD-10-CM | POA: Diagnosis not present

## 2018-04-04 DIAGNOSIS — G2119 Other drug induced secondary parkinsonism: Secondary | ICD-10-CM | POA: Diagnosis not present

## 2018-04-04 DIAGNOSIS — E538 Deficiency of other specified B group vitamins: Secondary | ICD-10-CM | POA: Diagnosis not present

## 2018-04-04 DIAGNOSIS — E229 Hyperfunction of pituitary gland, unspecified: Secondary | ICD-10-CM | POA: Diagnosis not present

## 2018-04-04 DIAGNOSIS — M5481 Occipital neuralgia: Secondary | ICD-10-CM | POA: Diagnosis not present

## 2018-04-04 DIAGNOSIS — I10 Essential (primary) hypertension: Secondary | ICD-10-CM | POA: Diagnosis not present

## 2018-04-04 DIAGNOSIS — G43719 Chronic migraine without aura, intractable, without status migrainosus: Secondary | ICD-10-CM | POA: Diagnosis not present

## 2018-04-04 DIAGNOSIS — Z23 Encounter for immunization: Secondary | ICD-10-CM | POA: Diagnosis not present

## 2018-04-04 DIAGNOSIS — E119 Type 2 diabetes mellitus without complications: Secondary | ICD-10-CM | POA: Diagnosis not present

## 2018-04-04 DIAGNOSIS — Z6841 Body Mass Index (BMI) 40.0 and over, adult: Secondary | ICD-10-CM | POA: Diagnosis not present

## 2018-05-02 ENCOUNTER — Ambulatory Visit: Payer: Medicare HMO | Admitting: Psychiatry

## 2018-05-08 DIAGNOSIS — M25572 Pain in left ankle and joints of left foot: Secondary | ICD-10-CM | POA: Diagnosis not present

## 2018-05-08 DIAGNOSIS — Z0000 Encounter for general adult medical examination without abnormal findings: Secondary | ICD-10-CM | POA: Diagnosis not present

## 2018-05-08 DIAGNOSIS — Z8719 Personal history of other diseases of the digestive system: Secondary | ICD-10-CM | POA: Diagnosis not present

## 2018-05-08 DIAGNOSIS — Z6841 Body Mass Index (BMI) 40.0 and over, adult: Secondary | ICD-10-CM | POA: Diagnosis not present

## 2018-05-08 DIAGNOSIS — E538 Deficiency of other specified B group vitamins: Secondary | ICD-10-CM | POA: Diagnosis not present

## 2018-05-08 DIAGNOSIS — R635 Abnormal weight gain: Secondary | ICD-10-CM | POA: Diagnosis not present

## 2018-05-08 DIAGNOSIS — E1165 Type 2 diabetes mellitus with hyperglycemia: Secondary | ICD-10-CM | POA: Diagnosis not present

## 2018-05-08 DIAGNOSIS — I10 Essential (primary) hypertension: Secondary | ICD-10-CM | POA: Diagnosis not present

## 2018-05-08 DIAGNOSIS — R251 Tremor, unspecified: Secondary | ICD-10-CM | POA: Diagnosis not present

## 2018-05-31 DIAGNOSIS — R0602 Shortness of breath: Secondary | ICD-10-CM | POA: Diagnosis not present

## 2018-05-31 DIAGNOSIS — J4540 Moderate persistent asthma, uncomplicated: Secondary | ICD-10-CM | POA: Diagnosis not present

## 2018-05-31 DIAGNOSIS — R42 Dizziness and giddiness: Secondary | ICD-10-CM | POA: Diagnosis not present

## 2018-05-31 DIAGNOSIS — R6883 Chills (without fever): Secondary | ICD-10-CM | POA: Diagnosis not present

## 2018-05-31 DIAGNOSIS — J029 Acute pharyngitis, unspecified: Secondary | ICD-10-CM | POA: Diagnosis not present

## 2018-05-31 DIAGNOSIS — R531 Weakness: Secondary | ICD-10-CM | POA: Diagnosis not present

## 2018-05-31 DIAGNOSIS — R6889 Other general symptoms and signs: Secondary | ICD-10-CM | POA: Diagnosis not present

## 2018-06-09 DIAGNOSIS — E538 Deficiency of other specified B group vitamins: Secondary | ICD-10-CM | POA: Diagnosis not present

## 2018-06-14 DIAGNOSIS — I10 Essential (primary) hypertension: Secondary | ICD-10-CM | POA: Diagnosis not present

## 2018-06-14 DIAGNOSIS — E7800 Pure hypercholesterolemia, unspecified: Secondary | ICD-10-CM | POA: Diagnosis not present

## 2018-06-14 DIAGNOSIS — E119 Type 2 diabetes mellitus without complications: Secondary | ICD-10-CM | POA: Diagnosis not present

## 2018-06-14 DIAGNOSIS — Z9889 Other specified postprocedural states: Secondary | ICD-10-CM | POA: Diagnosis not present

## 2018-06-14 DIAGNOSIS — R0602 Shortness of breath: Secondary | ICD-10-CM | POA: Diagnosis not present

## 2018-06-14 DIAGNOSIS — R002 Palpitations: Secondary | ICD-10-CM | POA: Diagnosis not present

## 2018-06-15 DIAGNOSIS — M7918 Myalgia, other site: Secondary | ICD-10-CM | POA: Diagnosis not present

## 2018-06-15 DIAGNOSIS — M25562 Pain in left knee: Secondary | ICD-10-CM | POA: Diagnosis not present

## 2018-06-15 DIAGNOSIS — G8929 Other chronic pain: Secondary | ICD-10-CM | POA: Diagnosis not present

## 2018-06-15 DIAGNOSIS — G5602 Carpal tunnel syndrome, left upper limb: Secondary | ICD-10-CM | POA: Diagnosis not present

## 2018-06-15 DIAGNOSIS — M533 Sacrococcygeal disorders, not elsewhere classified: Secondary | ICD-10-CM | POA: Diagnosis not present

## 2018-06-15 DIAGNOSIS — M79605 Pain in left leg: Secondary | ICD-10-CM | POA: Diagnosis not present

## 2018-06-21 DIAGNOSIS — K219 Gastro-esophageal reflux disease without esophagitis: Secondary | ICD-10-CM | POA: Diagnosis not present

## 2018-06-26 DIAGNOSIS — M5481 Occipital neuralgia: Secondary | ICD-10-CM | POA: Diagnosis not present

## 2018-06-26 DIAGNOSIS — M542 Cervicalgia: Secondary | ICD-10-CM | POA: Diagnosis not present

## 2018-06-30 DIAGNOSIS — Z20828 Contact with and (suspected) exposure to other viral communicable diseases: Secondary | ICD-10-CM | POA: Diagnosis not present

## 2018-07-10 DIAGNOSIS — E538 Deficiency of other specified B group vitamins: Secondary | ICD-10-CM | POA: Diagnosis not present

## 2018-07-14 ENCOUNTER — Encounter: Payer: Self-pay | Admitting: Psychiatry

## 2018-07-14 ENCOUNTER — Other Ambulatory Visit: Payer: Self-pay

## 2018-07-14 ENCOUNTER — Ambulatory Visit (INDEPENDENT_AMBULATORY_CARE_PROVIDER_SITE_OTHER): Payer: Medicare HMO | Admitting: Psychiatry

## 2018-07-14 DIAGNOSIS — F5105 Insomnia due to other mental disorder: Secondary | ICD-10-CM | POA: Diagnosis not present

## 2018-07-14 DIAGNOSIS — F4310 Post-traumatic stress disorder, unspecified: Secondary | ICD-10-CM

## 2018-07-14 DIAGNOSIS — F251 Schizoaffective disorder, depressive type: Secondary | ICD-10-CM | POA: Diagnosis not present

## 2018-07-14 DIAGNOSIS — F411 Generalized anxiety disorder: Secondary | ICD-10-CM

## 2018-07-14 NOTE — Progress Notes (Signed)
Virtual Visit via Telephone Note  I connected with Heather Haley on 07/14/18 at  9:45 AM EDT by telephone and verified that I am speaking with the correct person using two identifiers.   I discussed the limitations, risks, security and privacy concerns of performing an evaluation and management service by telephone and the availability of in person appointments. I also discussed with the patient that there may be a patient responsible charge related to this service. The patient expressed understanding and agreed to proceed.    I discussed the assessment and treatment plan with the patient. The patient was provided an opportunity to ask questions and all were answered. The patient agreed with the plan and demonstrated an understanding of the instructions.   The patient was advised to call back or seek an in-person evaluation if the symptoms worsen or if the condition fails to improve as anticipated.  [place] MD OP Progress Note  07/14/2018 12:34 PM CLARISA DANSER  MRN:  [id number]  Chief Complaint:  Chief Complaint    Follow-up     HPI: Heather Haley is a 58 year old Caucasian female, divorced, lives in [place], has a history of schizoaffective disorder, drug-induced Parkinson's disease currently resolved, memory problems, history of trauma, migraine headaches, diabetes melitis, chronic pain was evaluated by phone today.  Patient was offered video called however declined.  Patient today reports she has stopped taking all her psychotropic medications.  She reports she stopped the olanzapine since it was causing her to gain weight.  She reports once she stopped all her medications she feels much better.  She reports she does not have any significant mood symptoms now.  She denies any auditory or visual hallucinations.  She also did not appear to be preoccupied with any delusional thoughts today.  She reports she does have doxepin available which she takes as needed for sleep.She reports sleep  is good overall.  She denies any other concerns today and currently reports she does not want to be restarted on any medications.  Discussed with patient to monitor her symptoms closely. Visit Diagnosis:    ICD-10-CM   1. Schizoaffective disorder, depressive type ([place]) F25.1   2. PTSD (post-traumatic stress disorder) F43.10   3. Generalized anxiety disorder F41.1   4. Insomnia due to mental disorder F51.05     Past Psychiatric History: I have reviewed past psychiatric history from my progress note on 05/25/2017.  Past Medical History:  Past Medical History:  Diagnosis Date  . Anxiety   . Chronic kidney disease   . Chronic pain    previously saw Dr. Consuela Mimes in pain clinic, then saw pain specialist in [place]  . Depression   . Diabetes mellitus ([place])   . Frequency of urination   . GERD (gastroesophageal reflux disease)   . Headache(784.0)   . High cholesterol   . Hypertension   . IBS (irritable bowel syndrome)   . Left ankle instability   . Left knee DJD   . Lumbar Degenerative Disc Disease of  10/11/2014  . Neuromuscular disorder ([place])   . Osteoarthritis of hip (Right) 05/05/2015  . Other enthesopathy of ankle and tarsus 12/15/2009   Qualifier: Diagnosis of  By: Oneida Alar MD, KARL    . Parkinson's disease ([place])   . Peripheral sensory neuropathy (Bilateral) 11/19/2014  . Postoperative nausea and vomiting   . Schizophrenia [facility])     Past Surgical History:  Procedure Laterality Date  . ABDOMINAL HYSTERECTOMY    . ANKLE SURGERY    .  APPENDECTOMY    . COLONOSCOPY  2013  . COLONOSCOPY WITH PROPOFOL N/A 04/25/2017   Procedure: COLONOSCOPY WITH PROPOFOL;  Surgeon: Manya Silvas, MD;  Location: [facility] ENDOSCOPY;  Service: Endoscopy;  Laterality: N/A;  . ESOPHAGOGASTRODUODENOSCOPY (EGD) WITH PROPOFOL N/A 10/03/2014   Procedure: ESOPHAGOGASTRODUODENOSCOPY (EGD) WITH PROPOFOL;  Surgeon: Josefine Class, MD;  Location: [facility] ENDOSCOPY;  Service: Endoscopy;  Laterality: N/A;  .  ESOPHAGOGASTRODUODENOSCOPY (EGD) WITH PROPOFOL  04/25/2017   Procedure: ESOPHAGOGASTRODUODENOSCOPY (EGD) WITH PROPOFOL;  Surgeon: Manya Silvas, MD;  Location: [facility] ENDOSCOPY;  Service: Endoscopy;;  . KNEE ARTHROSCOPY  1997   left knee  . LEFT HEART CATH AND CORONARY ANGIOGRAPHY Left 11/10/2017   Procedure: LEFT HEART CATH AND CORONARY ANGIOGRAPHY;  Surgeon: Isaias Cowman, MD;  Location: [facility] CV LAB;  Service: Cardiovascular;  Laterality: Left;  . LEFT HEART CATHETERIZATION WITH CORONARY ANGIOGRAM N/A 01/11/2013   Procedure: LEFT HEART CATHETERIZATION WITH CORONARY ANGIOGRAM;  Surgeon: Sinclair Grooms, MD;  Location: [facility] CATH LAB;  Service: Cardiovascular;  Laterality: N/A;  . TOTAL KNEE ARTHROPLASTY  08/30/2011   Procedure: TOTAL KNEE ARTHROPLASTY;  Surgeon: Lorn Junes, MD;  Location: [facility];  Service: Orthopedics;  Laterality: Left;    Family Psychiatric History: I have reviewed family psychiatric history from my progress note on 05/25/2017.  Family History:  Family History  Problem Relation Age of Onset  . Cancer Mother   . Hypertension Father   . Heart disease Father   . Alcohol abuse Father     Social History: I have reviewed social history from my progress note on 05/25/2017. Social History   Socioeconomic History  . Marital status: Divorced    Spouse name: Not on file  . Number of children: 2  . Years of education: 40  . Highest education level: 11th grade  Occupational History    Employer: [employer]  . Financial resource strain: Somewhat hard  . Food insecurity:    Worry: Never true    Inability: Never true  . Transportation needs:    Medical: No    Non-medical: No  Tobacco Use  . Smoking status: Former Smoker    Packs/day: 2.00    Years: 15.00    Pack years: 30.00    Types: Cigarettes    Last attempt to quit: 02/09/1995    Years since quitting: 23.4  . Smokeless tobacco: Never Used  Substance and Sexual Activity  .  Alcohol use: No    Alcohol/week: 0.0 standard drinks  . Drug use: No  . Sexual activity: Not Currently  Lifestyle  . Physical activity:    Days per week: 0 days    Minutes per session: 0 min  . Stress: Rather much  Relationships  . Social connections:    Talks on phone: More than three times a week    Gets together: Once a week    Attends religious service: Never    Active member of club or organization: No    Attends meetings of clubs or organizations: Never    Relationship status: Divorced  Other Topics Concern  . Not on file  Social History Narrative   Patient lives at home alone. Patient works at [employer].   Caffeine daily- 2   Right handed.   Education- 11 th grade    Allergies:  Allergies  Allergen Reactions  . Prolixin Decanoate [Fluphenazine]   . Benztropine Other (See Comments)    Hair fall out  Suicide thoughts  .  Buprenorphine Hcl Other (See Comments)    Unable to void  . Carbidopa-Levodopa Nausea Only  . Codeine Hives    REACTION: hives  . Duloxetine Other (See Comments)    Altered mental status Alopecia, visual hallucinations, nightmares  . Gabapentin Swelling  . Lyrica [Pregabalin] Other (See Comments)    Alopecia, visual hallucinations, nightmares Altered mental status  . Morphine And Related Other (See Comments)    Unable to void  . Nortripytline Hcl [Nortriptyline] Other (See Comments)    Hair loss and night mares   . Nsaids     REACTION: palpitations, diaphoresis  . Penicillins Nausea And Vomiting and Other (See Comments)    REACTION: upset stomach Has patient had a PCN reaction causing immediate rash, facial/tongue/throat swelling, SOB or lightheadedness with hypotension: No Has patient had a PCN reaction causing severe rash involving mucus membranes or skin necrosis: No Has patient had a PCN reaction that required hospitalization: No Has patient had a PCN reaction occurring within the last 10 years: No If all of the above answers are  "NO", then may proceed with Cephalosporin use.  . Tolmetin Other (See Comments)    REACTION: palpitations, diaphoresis  . Wellbutrin [Bupropion]     Constipation, mood swings    Metabolic Disorder Labs: Lab Results  Component Value Date   HGBA1C 6.2 (H) 03/06/2017   MPG 131.24 03/06/2017   MPG 108 05/17/2015   Lab Results  Component Value Date   PROLACTIN 64.2 (H) 03/06/2017   Lab Results  Component Value Date   CHOL 175 03/06/2017   TRIG 234 (H) 03/06/2017   HDL 69 03/06/2017   CHOLHDL 2.5 03/06/2017   VLDL 47 (H) 03/06/2017   LDLCALC 59 03/06/2017   LDLCALC 93 05/17/2015   Lab Results  Component Value Date   TSH 2.766 08/24/2017   TSH 1.289 03/06/2017    Therapeutic Level Labs: No results found for: LITHIUM No results found for: VALPROATE No components found for:  CBMZ  Current Medications: Current Outpatient Medications  Medication Sig Dispense Refill  . acetaminophen (TYLENOL) 500 MG tablet Take 1 tablet (500 mg total) by mouth every 6 (six) hours as needed for mild pain. 30 tablet 0  . albuterol (PROVENTIL HFA;VENTOLIN HFA) 108 (90 Base) MCG/ACT inhaler Inhale 2 puffs into the lungs every 6 (six) hours as needed for wheezing or shortness of breath.    Marland Kitchen aspirin 81 MG tablet Take 81 mg by mouth daily.     . cetirizine (ZYRTEC) 10 MG tablet Take 10 mg by mouth daily.    . cholecalciferol (VITAMIN D) 1000 units tablet Take 1,000 Units by mouth daily.    . cyclobenzaprine (FLEXERIL) 10 MG tablet Take 10 mg by mouth 2 (two) times daily.     . diphenhydrAMINE (BENADRYL ALLERGY) 25 mg capsule Take 1 capsule (25 mg total) by mouth daily as needed. For side effects of tremors due to zyprexa 30 capsule 2  . docusate sodium (COLACE) 100 MG capsule Take 100 mg by mouth 2 (two) times daily.     Marland Kitchen doxepin (SINEQUAN) 10 MG capsule Take 1 capsule (10 mg total) by mouth at bedtime. Sleep and mood 30 capsule 2  . furosemide (LASIX) 20 MG tablet Take 20 mg by mouth daily.     Marland Kitchen  glimepiride (AMARYL) 2 MG tablet Take by mouth.    Marland Kitchen HYDROcodone-acetaminophen (NORCO/VICODIN) 5-325 MG tablet Take 0.5 tablets by mouth 2 (two) times daily. Every afternoon and at bedtime    .  hydroxypropyl methylcellulose / hypromellose (ISOPTO TEARS / GONIOVISC) 2.5 % ophthalmic solution Place 1 drop into both eyes every 4 (four) hours as needed for dry eyes.    . Melatonin 3 MG TABS Take 1 tablet (3 mg total) by mouth at bedtime. TAKE IT 90 MINUTES PRIOR TO BEDTIME FOR SLEEP 100 tablet 0  . metFORMIN (GLUCOPHAGE) 500 MG tablet Take 1 tablet (500 mg total) by mouth 2 (two) times daily with a meal. For diabetes management 10 tablet 0  . omeprazole (PRILOSEC) 40 MG capsule Take 40 mg by mouth daily as needed (heartburn).     . ondansetron (ZOFRAN) 4 MG tablet Take 4 mg by mouth every 8 (eight) hours as needed for nausea or vomiting.    . pravastatin (PRAVACHOL) 40 MG tablet Take 40 mg by mouth at bedtime.     . ranitidine (ZANTAC) 150 MG tablet Take 150 mg by mouth daily as needed for heartburn.     . rizatriptan (MAXALT) 10 MG tablet Take 10 mg by mouth 2 (two) times daily. May repeat in 2 hours if needed     . rOPINIRole (REQUIP XL) 2 MG 24 hr tablet Take by mouth.    . senna (SENOKOT) 8.6 MG tablet Take 1-4 tablets (8.6-34.4 mg total) by mouth daily as needed for constipation. (Patient taking differently: Take 1 tablet by mouth at bedtime. )    . topiramate (TOPAMAX) 100 MG tablet Take 0.5 tablets (50 mg total) by mouth at bedtime. For mood control 30 tablet 1  . traMADol (ULTRAM) 50 MG tablet Take 2 tablets (100 mg total) by mouth every 8 (eight) hours as needed for moderate pain or severe pain. (Patient taking differently: Take 50-100 mg by mouth See admin instructions. 100 mg every morning, 50 mg every afternoon, and 50 mg at bedtime) 180 tablet 2  . verapamil (CALAN-SR) 120 MG CR tablet Take 1 tablet (120 mg total) by mouth at bedtime. For high blood pressure 90 tablet 1  . vitamin B-12  (CYANOCOBALAMIN) 1000 MCG tablet Take 1,000 mcg by mouth daily.     No current facility-administered medications for this visit.      Musculoskeletal: Strength & Muscle Tone: reports as WNL Gait & Station: Reports as WNL Patient leans: N/A  Psychiatric Specialty Exam: Review of Systems  Psychiatric/Behavioral: Negative for depression. The patient is not nervous/anxious.   All other systems reviewed and are negative.   There were no vitals taken for this visit.There is no height or weight on file to calculate BMI.  General Appearance: UTA  Eye Contact:  UTA  Speech:  Normal Rate  Volume:  Normal  Mood:  Euthymic  Affect:  UTA  Thought Process:  Goal Directed and Descriptions of Associations: Intact  Orientation:  Full (Time, Place, and Person)  Thought Content: Logical   Suicidal Thoughts:  No  Homicidal Thoughts:  No  Memory:  Immediate;   Fair Recent;   Fair Remote;   Fair  Judgement:  Fair  Insight:  Fair  Psychomotor Activity:  UTA  Concentration:  Concentration: Fair and Attention Span: Fair  Recall:  [employer] of Knowledge: Fair  Language: Fair  Akathisia:  No  Handed:  Right  AIMS (if indicated): Denies tremors, rigidity, stiffness  Assets:  Communication Skills Desire for Improvement Social Support  ADL's:  Intact  Cognition: WNL  Sleep:  Fair   Screenings: AIMS     Office Visit from 12/01/2017 in [place] Office Visit  from 09/20/2017 in [place] Visit from 06/29/2017 in [place] Office Visit from 03/24/2017 in [place] Admission (Discharged) from 03/03/2017 in [facility] [place] [facility]  AIMS Total Score  1  0  6  10  0    AUDIT     Admission (Discharged) from 03/03/2017 in [place] [facility]  Alcohol Use Disorder Identification Test Final Score (AUDIT)  0    GAD-7     Office  Visit from 08/22/2015 in [place] at [facility]  Total GAD-7 Score  6    PHQ2-9     Office Visit from 03/23/2016 in [place] Office Visit from 08/22/2015 in [place] at [place] from 08/06/2015 in [place] Office Visit from 06/10/2015 in [place] Clinical Support from 05/19/2015 in [place]  PHQ-2 Total Score  0  1  0  0  0       Assessment and Plan: Jamyria is a 58 year old Caucasian female who has a history of schizoaffective disorder, PTSD, migraine headaches, diabetes melitis, drug-induced Parkinson's disease was evaluated by phone today.  Patient reports she is completely off of all her psychotropic medications at this time and continues to do well.  She reports she will monitor herself closely and reach out to writer if she has any worsening symptoms.  Plan Schizoaffective disorder- stable Patient is currently not on any medications.  She has been noncompliant with Zyprexa. Discontinue Zyprexa for noncompliance.  For PTSD- stable Patient reports she does not have any symptoms at this time.  For insomnia-stable Doxepin 10 mg p.o. nightly as needed  Follow-up in clinic in 4 weeks or sooner if needed.  July 9 at 4:45 PM.  I have spent atleast 15 minutes non face to face with patient today. More than 50 % of the time was spent for psychoeducation and supportive psychotherapy and care coordination.  This note was generated in part or whole with voice recognition software. Voice recognition is usually quite accurate but there are transcription errors that can and very often do occur. I apologize for any typographical errors that were not detected and corrected.        Ursula Alert, MD 07/14/2018, 12:34 PM

## 2018-07-26 DIAGNOSIS — R079 Chest pain, unspecified: Secondary | ICD-10-CM | POA: Diagnosis not present

## 2018-07-26 DIAGNOSIS — E119 Type 2 diabetes mellitus without complications: Secondary | ICD-10-CM | POA: Diagnosis not present

## 2018-07-26 DIAGNOSIS — Z9889 Other specified postprocedural states: Secondary | ICD-10-CM | POA: Diagnosis not present

## 2018-07-26 DIAGNOSIS — I10 Essential (primary) hypertension: Secondary | ICD-10-CM | POA: Diagnosis not present

## 2018-07-26 DIAGNOSIS — E7800 Pure hypercholesterolemia, unspecified: Secondary | ICD-10-CM | POA: Diagnosis not present

## 2018-07-26 DIAGNOSIS — R1310 Dysphagia, unspecified: Secondary | ICD-10-CM | POA: Diagnosis not present

## 2018-07-26 DIAGNOSIS — R002 Palpitations: Secondary | ICD-10-CM | POA: Diagnosis not present

## 2018-07-26 DIAGNOSIS — N183 Chronic kidney disease, stage 3 (moderate): Secondary | ICD-10-CM | POA: Diagnosis not present

## 2018-07-31 DIAGNOSIS — M25572 Pain in left ankle and joints of left foot: Secondary | ICD-10-CM | POA: Diagnosis not present

## 2018-07-31 DIAGNOSIS — R251 Tremor, unspecified: Secondary | ICD-10-CM | POA: Diagnosis not present

## 2018-07-31 DIAGNOSIS — I10 Essential (primary) hypertension: Secondary | ICD-10-CM | POA: Diagnosis not present

## 2018-07-31 DIAGNOSIS — E7800 Pure hypercholesterolemia, unspecified: Secondary | ICD-10-CM | POA: Diagnosis not present

## 2018-07-31 DIAGNOSIS — Z6841 Body Mass Index (BMI) 40.0 and over, adult: Secondary | ICD-10-CM | POA: Diagnosis not present

## 2018-07-31 DIAGNOSIS — R635 Abnormal weight gain: Secondary | ICD-10-CM | POA: Diagnosis not present

## 2018-07-31 DIAGNOSIS — G8929 Other chronic pain: Secondary | ICD-10-CM | POA: Diagnosis not present

## 2018-07-31 DIAGNOSIS — E1165 Type 2 diabetes mellitus with hyperglycemia: Secondary | ICD-10-CM | POA: Diagnosis not present

## 2018-07-31 DIAGNOSIS — Z8719 Personal history of other diseases of the digestive system: Secondary | ICD-10-CM | POA: Diagnosis not present

## 2018-08-03 DIAGNOSIS — G5602 Carpal tunnel syndrome, left upper limb: Secondary | ICD-10-CM | POA: Diagnosis not present

## 2018-08-03 DIAGNOSIS — M533 Sacrococcygeal disorders, not elsewhere classified: Secondary | ICD-10-CM | POA: Diagnosis not present

## 2018-08-03 DIAGNOSIS — M25562 Pain in left knee: Secondary | ICD-10-CM | POA: Diagnosis not present

## 2018-08-03 DIAGNOSIS — G8929 Other chronic pain: Secondary | ICD-10-CM | POA: Diagnosis not present

## 2018-08-03 DIAGNOSIS — M7918 Myalgia, other site: Secondary | ICD-10-CM | POA: Diagnosis not present

## 2018-08-17 ENCOUNTER — Ambulatory Visit (INDEPENDENT_AMBULATORY_CARE_PROVIDER_SITE_OTHER): Payer: Medicare HMO | Admitting: Psychiatry

## 2018-08-17 ENCOUNTER — Other Ambulatory Visit: Payer: Self-pay

## 2018-08-17 ENCOUNTER — Encounter: Payer: Self-pay | Admitting: Psychiatry

## 2018-08-17 DIAGNOSIS — F251 Schizoaffective disorder, depressive type: Secondary | ICD-10-CM | POA: Diagnosis not present

## 2018-08-17 DIAGNOSIS — F5105 Insomnia due to other mental disorder: Secondary | ICD-10-CM | POA: Diagnosis not present

## 2018-08-17 DIAGNOSIS — F411 Generalized anxiety disorder: Secondary | ICD-10-CM | POA: Diagnosis not present

## 2018-08-17 DIAGNOSIS — F4310 Post-traumatic stress disorder, unspecified: Secondary | ICD-10-CM

## 2018-08-17 MED ORDER — RISPERIDONE 0.5 MG PO TABS: 0.5000 mg | ORAL_TABLET | Freq: Every day | ORAL | 0 refills | Status: DC

## 2018-08-17 NOTE — Progress Notes (Signed)
Virtual Visit via Telephone Note  I connected with Heather Haley on 08/17/18 at  4:45 PM EDT by telephone and verified that I am speaking with the correct person using two identifiers.   I discussed the limitations, risks, security and privacy concerns of performing an evaluation and management service by telephone and the availability of in person appointments. I also discussed with the patient that there may be a patient responsible charge related to this service. The patient expressed understanding and agreed to proceed.    I discussed the assessment and treatment plan with the patient. The patient was provided an opportunity to ask questions and all were answered. The patient agreed with the plan and demonstrated an understanding of the instructions.   The patient was advised to call back or seek an in-person evaluation if the symptoms worsen or if the condition fails to improve as anticipated.   [place] MD OP Progress Note  08/17/2018 6:10 PM [name]  MRN:  [id number]  Chief Complaint:  Chief Complaint    Follow-up     HPI: Heather Haley is a 58 year old Caucasian female, divorced, lives in [place], has a history of schizoaffective disorder, drug-induced Parkinson's disease currently resolved, memory problems, history of trauma, migraine headaches, diabetes melitis, chronic pain was evaluated by phone today.  Patient was offered video called however declined.  Patient currently reports she is no longer on any psychotropic medications.  She appeared to be distressed and depressed today.  She appeared to be tearful and reported she is not doing well.  She has been hearing voices in her head however reports she does not want the voices to go away since they are very positive.  She continues to have some relationship struggles with her daughter.  That makes her more stressed.  Patient reports she is no longer taking the sleep medication also.  She reports she has a lot of  racing thoughts at night.  Discussed with patient about starting risperidone.  She agrees with plan.  Patient denies any suicidality, homicidality or perceptual disturbances.  Patient denies any other concerns today. Visit Diagnosis:    ICD-10-CM   1. Schizoaffective disorder, depressive type ([place])  F25.1 risperiDONE (RISPERDAL) 0.5 MG tablet  2. PTSD (post-traumatic stress disorder)  F43.10 risperiDONE (RISPERDAL) 0.5 MG tablet  3. Generalized anxiety disorder  F41.1   4. Insomnia due to mental disorder  F51.05     Past Psychiatric History: I have reviewed past psychiatric history from my progress note on 05/25/2017.  Past Medical History:  Past Medical History:  Diagnosis Date  . Anxiety   . Chronic kidney disease   . Chronic pain    previously saw Dr. Consuela Mimes in pain clinic, then saw pain specialist in [place]  . Depression   . Diabetes mellitus ([place])   . Frequency of urination   . GERD (gastroesophageal reflux disease)   . Headache(784.0)   . High cholesterol   . Hypertension   . IBS (irritable bowel syndrome)   . Left ankle instability   . Left knee DJD   . Lumbar Degenerative Disc Disease of  10/11/2014  . Neuromuscular disorder ([place])   . Osteoarthritis of hip (Right) 05/05/2015  . Other enthesopathy of ankle and tarsus 12/15/2009   Qualifier: Diagnosis of  By: Oneida Alar MD, KARL    . Parkinson's disease ([place])   . Peripheral sensory neuropathy (Bilateral) 11/19/2014  . Postoperative nausea and vomiting   . Schizophrenia [facility])     Past Surgical History:  Procedure Laterality Date  . ABDOMINAL HYSTERECTOMY    . ANKLE SURGERY    . APPENDECTOMY    . COLONOSCOPY  2013  . COLONOSCOPY WITH PROPOFOL N/A 04/25/2017   Procedure: COLONOSCOPY WITH PROPOFOL;  Surgeon: Manya Silvas, MD;  Location[facility] ENDOSCOPY;  Service: Endoscopy;  Laterality: N/A;  . ESOPHAGOGASTRODUODENOSCOPY (EGD) WITH PROPOFOL N/A 10/03/2014   Procedure: ESOPHAGOGASTRODUODENOSCOPY (EGD) WITH  PROPOFOL;  Surgeon: Josefine Class, MD;  Location: [facility] ENDOSCOPY;  Service: Endoscopy;  Laterality: N/A;  . ESOPHAGOGASTRODUODENOSCOPY (EGD) WITH PROPOFOL  04/25/2017   Procedure: ESOPHAGOGASTRODUODENOSCOPY (EGD) WITH PROPOFOL;  Surgeon: Manya Silvas, MD;  Location: [facility] ENDOSCOPY;  Service: Endoscopy;;  . KNEE ARTHROSCOPY  1997   left knee  . LEFT HEART CATH AND CORONARY ANGIOGRAPHY Left 11/10/2017   Procedure: LEFT HEART CATH AND CORONARY ANGIOGRAPHY;  Surgeon: Isaias Cowman, MD;  Location: [place] CV LAB;  Service: Cardiovascular;  Laterality: Left;  . LEFT HEART CATHETERIZATION WITH CORONARY ANGIOGRAM N/A 01/11/2013   Procedure: LEFT HEART CATHETERIZATION WITH CORONARY ANGIOGRAM;  Surgeon: Sinclair Grooms, MD;  Location: [facility] CATH LAB;  Service: Cardiovascular;  Laterality: N/A;  . TOTAL KNEE ARTHROPLASTY  08/30/2011   Procedure: TOTAL KNEE ARTHROPLASTY;  Surgeon: Lorn Junes, MD;  Location: [place];  Service: Orthopedics;  Laterality: Left;    Family Psychiatric History: I have reviewed family psychiatric history from my progress note on 05/25/2017.  Family History:  Family History  Problem Relation Age of Onset  . Cancer Mother   . Hypertension Father   . Heart disease Father   . Alcohol abuse Father     Social History: I have reviewed social history from my progress note on 05/25/2017. Social History   Socioeconomic History  . Marital status: Divorced    Spouse name: Not on file  . Number of children: 2  . Years of education: 6  . Highest education level: 11th grade  Occupational History    Employer: [employer]  . Financial resource strain: Somewhat hard  . Food insecurity    Worry: Never true    Inability: Never true  . Transportation needs    Medical: No    Non-medical: No  Tobacco Use  . Smoking status: Former Smoker    Packs/day: 2.00    Years: 15.00    Pack years: 30.00    Types: Cigarettes    Quit date: 02/09/1995     Years since quitting: 23.5  . Smokeless tobacco: Never Used  Substance and Sexual Activity  . Alcohol use: No    Alcohol/week: 0.0 standard drinks  . Drug use: No  . Sexual activity: Not Currently  Lifestyle  . Physical activity    Days per week: 0 days    Minutes per session: 0 min  . Stress: Rather much  Relationships  . Social connections    Talks on phone: More than three times a week    Gets together: Once a week    Attends religious service: Never    Active member of club or organization: No    Attends meetings of clubs or organizations: Never    Relationship status: Divorced  Other Topics Concern  . Not on file  Social History Narrative   Patient lives at home alone. Patient works at [employer].   Caffeine daily- 2   Right handed.   Education- 11 th grade    Allergies:  Allergies  Allergen Reactions  . Prolixin Decanoate [Fluphenazine]   .  Benztropine Other (See Comments)    Hair fall out  Suicide thoughts  . Buprenorphine Hcl Other (See Comments)    Unable to void  . Carbidopa-Levodopa Nausea Only  . Codeine Hives    REACTION: hives  . Duloxetine Other (See Comments)    Altered mental status Alopecia, visual hallucinations, nightmares  . Gabapentin Swelling  . Lyrica [Pregabalin] Other (See Comments)    Alopecia, visual hallucinations, nightmares Altered mental status  . Morphine And Related Other (See Comments)    Unable to void  . Nortripytline Hcl [Nortriptyline] Other (See Comments)    Hair loss and night mares   . Nsaids     REACTION: palpitations, diaphoresis  . Penicillins Nausea And Vomiting and Other (See Comments)    REACTION: upset stomach Has patient had a PCN reaction causing immediate rash, facial/tongue/throat swelling, SOB or lightheadedness with hypotension: No Has patient had a PCN reaction causing severe rash involving mucus membranes or skin necrosis: No Has patient had a PCN reaction that required hospitalization: No Has  patient had a PCN reaction occurring within the last 10 years: No If all of the above answers are "NO", then may proceed with Cephalosporin use.  . Tolmetin Other (See Comments)    REACTION: palpitations, diaphoresis  . Wellbutrin [Bupropion]     Constipation, mood swings    Metabolic Disorder Labs: Lab Results  Component Value Date   HGBA1C 6.2 (H) 03/06/2017   MPG 131.24 03/06/2017   MPG 108 05/17/2015   Lab Results  Component Value Date   PROLACTIN 64.2 (H) 03/06/2017   Lab Results  Component Value Date   CHOL 175 03/06/2017   TRIG 234 (H) 03/06/2017   HDL 69 03/06/2017   CHOLHDL 2.5 03/06/2017   VLDL 47 (H) 03/06/2017   LDLCALC 59 03/06/2017   LDLCALC 93 05/17/2015   Lab Results  Component Value Date   TSH 2.766 08/24/2017   TSH 1.289 03/06/2017    Therapeutic Level Labs: No results found for: LITHIUM No results found for: VALPROATE No components found for:  CBMZ  Current Medications: Current Outpatient Medications  Medication Sig Dispense Refill  . rOPINIRole (REQUIP) 0.25 MG tablet Take by mouth.    Marland Kitchen acetaminophen (TYLENOL) 500 MG tablet Take 1 tablet (500 mg total) by mouth every 6 (six) hours as needed for mild pain. 30 tablet 0  . albuterol (PROVENTIL HFA;VENTOLIN HFA) 108 (90 Base) MCG/ACT inhaler Inhale 2 puffs into the lungs every 6 (six) hours as needed for wheezing or shortness of breath.    Marland Kitchen aspirin 81 MG tablet Take 81 mg by mouth daily.     . cetirizine (ZYRTEC) 10 MG tablet Take 10 mg by mouth daily.    . cholecalciferol (VITAMIN D) 1000 units tablet Take 1,000 Units by mouth daily.    . cyclobenzaprine (FLEXERIL) 10 MG tablet Take 10 mg by mouth 2 (two) times daily.     . diphenhydrAMINE (BENADRYL ALLERGY) 25 mg capsule Take 1 capsule (25 mg total) by mouth daily as needed. For side effects of tremors due to zyprexa 30 capsule 2  . docusate sodium (COLACE) 100 MG capsule Take 100 mg by mouth 2 (two) times daily.     Marland Kitchen doxepin (SINEQUAN) 10 MG  capsule Take 1 capsule (10 mg total) by mouth at bedtime. Sleep and mood 30 capsule 2  . furosemide (LASIX) 20 MG tablet Take 20 mg by mouth daily.     Marland Kitchen glimepiride (AMARYL) 2 MG tablet Take by  mouth.    Marland Kitchen HYDROcodone-acetaminophen (NORCO/VICODIN) 5-325 MG tablet Take 0.5 tablets by mouth 2 (two) times daily. Every afternoon and at bedtime    . hydroxypropyl methylcellulose / hypromellose (ISOPTO TEARS / GONIOVISC) 2.5 % ophthalmic solution Place 1 drop into both eyes every 4 (four) hours as needed for dry eyes.    . Melatonin 3 MG TABS Take 1 tablet (3 mg total) by mouth at bedtime. TAKE IT 90 MINUTES PRIOR TO BEDTIME FOR SLEEP 100 tablet 0  . metFORMIN (GLUCOPHAGE) 500 MG tablet Take 1 tablet (500 mg total) by mouth 2 (two) times daily with a meal. For diabetes management 10 tablet 0  . omeprazole (PRILOSEC) 40 MG capsule Take 40 mg by mouth daily as needed (heartburn).     . ondansetron (ZOFRAN) 4 MG tablet Take 4 mg by mouth every 8 (eight) hours as needed for nausea or vomiting.    . pravastatin (PRAVACHOL) 40 MG tablet Take 40 mg by mouth at bedtime.     . ranitidine (ZANTAC) 150 MG tablet Take 150 mg by mouth daily as needed for heartburn.     . risperiDONE (RISPERDAL) 0.5 MG tablet Take 1 tablet (0.5 mg total) by mouth at bedtime. 90 tablet 0  . rizatriptan (MAXALT) 10 MG tablet Take 10 mg by mouth 2 (two) times daily. May repeat in 2 hours if needed     . rOPINIRole (REQUIP XL) 2 MG 24 hr tablet Take by mouth.    . senna (SENOKOT) 8.6 MG tablet Take 1-4 tablets (8.6-34.4 mg total) by mouth daily as needed for constipation. (Patient taking differently: Take 1 tablet by mouth at bedtime. )    . topiramate (TOPAMAX) 100 MG tablet Take 0.5 tablets (50 mg total) by mouth at bedtime. For mood control 30 tablet 1  . traMADol (ULTRAM) 50 MG tablet Take 2 tablets (100 mg total) by mouth every 8 (eight) hours as needed for moderate pain or severe pain. (Patient taking differently: Take 50-100 mg by  mouth See admin instructions. 100 mg every morning, 50 mg every afternoon, and 50 mg at bedtime) 180 tablet 2  . verapamil (CALAN-SR) 120 MG CR tablet Take 1 tablet (120 mg total) by mouth at bedtime. For high blood pressure 90 tablet 1  . vitamin B-12 (CYANOCOBALAMIN) 1000 MCG tablet Take 1,000 mcg by mouth daily.     No current facility-administered medications for this visit.      Musculoskeletal: Strength & Muscle Tone: UTA Gait & Station: UTA Patient leans: N/A  Psychiatric Specialty Exam: Review of Systems  Psychiatric/Behavioral: Positive for depression and hallucinations. The patient has insomnia.   All other systems reviewed and are negative.   There were no vitals taken for this visit.There is no height or weight on file to calculate BMI.  General Appearance: UTA  Eye Contact:  UTA  Speech:  Clear and Coherent  Volume:  Normal  Mood:  Depressed  Affect:  UTA  Thought Process:  Goal Directed and Descriptions of Associations: Intact  Orientation:  Full (Time, Place, and Person)  Thought Content: Hallucinations: Auditory reports positive voices   Suicidal Thoughts:  No  Homicidal Thoughts:  No  Memory:  Immediate;   Fair Recent;   Fair Remote;   Fair  Judgement:  Fair  Insight:  Fair  Psychomotor Activity:  UTA  Concentration:  Concentration: Fair and Attention Span: Fair  Recall:  [employer] of Knowledge: Fair  Language: Fair  Akathisia:  No  Handed:  Right  AIMS (if indicated): does have hx of tremors , currently denies it  Assets:  Communication Skills Desire for Improvement Housing  ADL's:  Intact  Cognition: WNL  Sleep:  Restless   Screenings: AIMS     Office Visit from 12/01/2017 in [place] Office Visit from 09/20/2017 in [place] Office Visit from 06/29/2017 in [place] Visit from 03/24/2017 in [place] Admission  (Discharged) from 03/03/2017 in [facility] [facility]  AIMS Total Score  1  0  6  10  0    AUDIT     Admission (Discharged) from 03/03/2017 in [place] [facility]  Alcohol Use Disorder Identification Test Final Score (AUDIT)  0    GAD-7     Office Visit from 08/22/2015 in [place] [facility]  Total GAD-7 Score  6    PHQ2-9     Office Visit from 03/23/2016 in [place] Office Visit from 08/22/2015 in [place] [facility] [place] from 08/06/2015 in [place] Office Visit from 06/10/2015 in [place] Clinical Support from 05/19/2015 in [place]  PHQ-2 Total Score  0  1  0  0  0       Assessment and Plan:Daniyla is a 58 year old Caucasian female who has a history of schizoaffective disorder, PTSD, migraine headaches, diabetes melitis, drug-induced Parkinson's disease was evaluated by phone today.  Patient is currently having worsening mood symptoms as well as auditory hallucinations.  She however reports her hallucinations as positive however she continues to struggle with sadness and relationship struggles with her daughter.  Patient is currently noncompliant with her medications however agrees to restart occasions today.  Plan Schizoaffective disorder-unstable Start risperidone low-dose-0.5 mg p.o. nightly  For PTSD-stable She denies any symptoms at this time.  For insomnia- stable Discussed with her that risperidone will help. She reports she is no longer taking doxepin.  Follow-up in clinic in 3 to 4 weeks or sooner if needed.  I have spent atleast 15 minutes non face to face with patient today. More than 50 % of the time was spent for psychoeducation and supportive psychotherapy and care coordination.  This note was generated in part or whole  with voice recognition software. Voice recognition is usually quite accurate but there are transcription errors that can and very often do occur. I apologize for any typographical errors that were not detected and corrected.        Ursula Alert, MD 08/17/2018, 6:10 PM

## 2018-08-21 ENCOUNTER — Other Ambulatory Visit: Payer: Self-pay

## 2018-08-21 ENCOUNTER — Emergency Department (HOSPITAL_COMMUNITY): Admission: EM | Admit: 2018-08-21 | Discharge: 2018-08-21 | Discharge status: Absent without leave | Payer: Self-pay

## 2018-08-21 ENCOUNTER — Encounter (HOSPITAL_COMMUNITY): Payer: Self-pay | Admitting: Emergency Medicine

## 2018-08-21 ENCOUNTER — Emergency Department (HOSPITAL_COMMUNITY)
Admission: EM | Admit: 2018-08-21 | Discharge: 2018-08-22 | Disposition: A | Discharge status: Other Acute Inpatient Hospital | Payer: Medicare HMO | Attending: Emergency Medicine | Admitting: Emergency Medicine

## 2018-08-21 DIAGNOSIS — G20 Parkinson's disease: Secondary | ICD-10-CM | POA: Diagnosis not present

## 2018-08-21 DIAGNOSIS — I129 Hypertensive chronic kidney disease with stage 1 through stage 4 chronic kidney disease, or unspecified chronic kidney disease: Secondary | ICD-10-CM | POA: Diagnosis not present

## 2018-08-21 DIAGNOSIS — Z7982 Long term (current) use of aspirin: Secondary | ICD-10-CM | POA: Diagnosis not present

## 2018-08-21 DIAGNOSIS — F209 Schizophrenia, unspecified: Secondary | ICD-10-CM | POA: Diagnosis not present

## 2018-08-21 DIAGNOSIS — I10 Essential (primary) hypertension: Secondary | ICD-10-CM | POA: Diagnosis not present

## 2018-08-21 DIAGNOSIS — J45909 Unspecified asthma, uncomplicated: Secondary | ICD-10-CM | POA: Diagnosis not present

## 2018-08-21 DIAGNOSIS — Z20828 Contact with and (suspected) exposure to other viral communicable diseases: Secondary | ICD-10-CM | POA: Diagnosis not present

## 2018-08-21 DIAGNOSIS — Z79899 Other long term (current) drug therapy: Secondary | ICD-10-CM | POA: Diagnosis not present

## 2018-08-21 DIAGNOSIS — N182 Chronic kidney disease, stage 2 (mild): Secondary | ICD-10-CM | POA: Insufficient documentation

## 2018-08-21 DIAGNOSIS — Z03818 Encounter for observation for suspected exposure to other biological agents ruled out: Secondary | ICD-10-CM | POA: Diagnosis not present

## 2018-08-21 DIAGNOSIS — E1122 Type 2 diabetes mellitus with diabetic chronic kidney disease: Secondary | ICD-10-CM | POA: Diagnosis not present

## 2018-08-21 DIAGNOSIS — Z87891 Personal history of nicotine dependence: Secondary | ICD-10-CM | POA: Diagnosis not present

## 2018-08-21 DIAGNOSIS — Z7984 Long term (current) use of oral hypoglycemic drugs: Secondary | ICD-10-CM | POA: Insufficient documentation

## 2018-08-21 LAB — COMPREHENSIVE METABOLIC PANEL
ALT: 39 U/L (ref 0–44)
AST: 36 U/L (ref 15–41)
Albumin: 4.7 g/dL (ref 3.5–5.0)
Alkaline Phosphatase: 89 U/L (ref 38–126)
Anion gap: 17 — ABNORMAL HIGH (ref 5–15)
BUN: 16 mg/dL (ref 6–20)
CO2: 19 mmol/L — ABNORMAL LOW (ref 22–32)
Calcium: 9.7 mg/dL (ref 8.9–10.3)
Chloride: 105 mmol/L (ref 98–111)
Creatinine, Ser: 1.06 mg/dL — ABNORMAL HIGH (ref 0.44–1.00)
GFR calc Af Amer: >60 mL/min (ref >60)
GFR calc non Af Amer: 57 mL/min — ABNORMAL LOW (ref >60)
Glucose, Bld: 155 mg/dL — ABNORMAL HIGH (ref 70–99)
Potassium: 3.3 mmol/L — ABNORMAL LOW (ref 3.5–5.1)
Sodium: 141 mmol/L (ref 135–145)
Total Bilirubin: 1.1 mg/dL (ref 0.3–1.2)
Total Protein: 8.3 g/dL — ABNORMAL HIGH (ref 6.5–8.1)

## 2018-08-21 LAB — CBC WITH DIFFERENTIAL/PLATELET
Abs Immature Granulocytes: 0.04 10*3/uL (ref 0.00–0.07)
Basophils Absolute: 0.1 10*3/uL (ref 0.0–0.1)
Basophils Relative: 1 %
Eosinophils Absolute: 0.1 10*3/uL (ref 0.0–0.5)
Eosinophils Relative: 1 %
HCT: 50.2 % — ABNORMAL HIGH (ref 36.0–46.0)
Hemoglobin: 15.8 g/dL — ABNORMAL HIGH (ref 12.0–15.0)
Immature Granulocytes: 0 %
Lymphocytes Relative: 24 %
Lymphs Abs: 2.5 10*3/uL (ref 0.7–4.0)
MCH: 28.7 pg (ref 26.0–34.0)
MCHC: 31.5 g/dL (ref 30.0–36.0)
MCV: 91.1 fL (ref 80.0–100.0)
Monocytes Absolute: 0.8 10*3/uL (ref 0.1–1.0)
Monocytes Relative: 7 %
Neutro Abs: 7 10*3/uL (ref 1.7–7.7)
Neutrophils Relative %: 67 %
Platelets: 284 10*3/uL (ref 150–400)
RBC: 5.51 MIL/uL — ABNORMAL HIGH (ref 3.87–5.11)
RDW: 14.3 % (ref 11.5–15.5)
WBC: 10.4 10*3/uL (ref 4.0–10.5)
nRBC: 0 % (ref 0.0–0.2)

## 2018-08-21 LAB — ACETAMINOPHEN LEVEL: Acetaminophen (Tylenol), Serum: <10 ug/mL — ABNORMAL LOW (ref 10–30)

## 2018-08-21 LAB — ETHANOL: Alcohol, Ethyl (B): <10 mg/dL (ref <10)

## 2018-08-21 LAB — SALICYLATE LEVEL: Salicylate Lvl: <7 mg/dL (ref 2.8–30.0)

## 2018-08-21 MED ORDER — SODIUM CHLORIDE 0.9 % IV BOLUS
1000.0000 mL | Freq: Once | INTRAVENOUS | Status: AC
  Administered 2018-08-21: 1000 mL via INTRAVENOUS

## 2018-08-21 MED ORDER — POTASSIUM CHLORIDE CRYS ER 20 MEQ PO TBCR
40.0000 meq | EXTENDED_RELEASE_TABLET | Freq: Once | ORAL | Status: AC
  Administered 2018-08-21: 40 meq via ORAL
  Filled 2018-08-21: qty 2

## 2018-08-21 NOTE — BH Assessment (Signed)
[facility] Assessment Progress Note   Case was staffed with Mariea Clonts DO who recommended a inpatient admission to assist with stabilization.

## 2018-08-21 NOTE — ED Provider Notes (Signed)
Care assumed from [facility], [place], at shift change, please see their notes for full documentation of patient's complaint/HPI. Briefly, pt here with IVC paperwork taken out by daughter due to patient not taken her medications for her schizophrenia. Results so far show hypokalemia 3.3 and an anion gap of 17; receiving IVF and KDUR. Awaiting TTS consult. Plan is to follow their recommendations.   Physical Exam  BP (!) 166/102 (BP Location: Right Arm)   Pulse (!) 120   Temp 98.9 F (37.2 C) (Oral)   Resp 20   SpO2 97%   Physical Exam  ED Course/Procedures     Procedures  MDM  Only lab remaining is UDS; otherwise patient is medically cleared. Per TTS consult; pt meets inpatient criteria. Awaiting placement at this time.        Eustaquio Maize, PA-C 08/22/18 0001    Tegeler, Gwenyth Allegra, MD 08/22/18 [phone]

## 2018-08-21 NOTE — ED Notes (Signed)
2HOSPITAL  BELONGINGS BAGS PLACED IN LOCKER # 30.
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
## 2018-08-21 NOTE — ED Triage Notes (Signed)
Pt brought in by [facility] Deputy under IVC papers that state: "[name] states: the respondent has been diagnosed with schizophrenia but has not been taking her medication. The respondent has not been sleeping and she has not been tending to her personal hygiene. The respondent has been hallucinating and hearing voices. Today the respondent drive into a police substation and asked the officers, "Where is the golden angel". When officers ask that she exit the car she told them to, "get the man out of the back seat first". There was no one in the back seat. The respondent also told the petitioner, that she was giving up her car to [employer]. In addition, she told the officers to take a picture of her because petitioner was going to kill her. When petitioner and respondent arrived at respondent's home respondent told her the house/yard was full of people that had been partying all night. [name] advised no one was present at the home. Finally, petitioner believes respondent is abusing her medication which causes her to get angry, aggressive and threatening".

## 2018-08-21 NOTE — ED Provider Notes (Signed)
[facility] DEPT Provider Note   CSN: [id number] Arrival date & time: 08/21/18  1332    History   Chief Complaint Chief Complaint  Patient presents with  . IVC    HPI Heather Haley is a 58 y.o. female with a past medical history of CKD, hypertension, Parkinson's disease, schizophrenia who presents to ED under IVC.  "[name] states: the respondent has been diagnosed with schizophrenia but has not been taking her medication. The respondent has not been sleeping and she has not been tending to her personal hygiene. The respondent has been hallucinating and hearing voices. Today the respondent drive into a police substation and asked the officers, "Where is the golden angel". When officers ask that she exit the car she told them to, "get the man out of the back seat first". There was no one in the back seat. The respondent also told the petitioner, that she was giving up her car to [employer]. In addition, she told the officers to take a picture of her because petitioner was going to kill her. When petitioner and respondent arrived at respondent's home respondent told her the house/yard was full of people that had been partying all night. [name] advised no one was present at the home. Finally, petitioner believes respondent is abusing her medication which causes her to get angry, aggressive and threatening".   Patient herself states that "I do not know why I am here, I think my daughter just wanted me to get checked out."  States that she has been hearing voices but is unwilling to tell me what the voices are saying.  She reports compliance with her home medications.  However she has a bag of medications with her and is trying to find [name] to take the medications from her.  She denies any SI, HI, visual hallucinations, alcohol, tobacco or drug use or somatic complaints.    HPI  Past Medical History:  Diagnosis Date  . Anxiety   . Chronic  kidney disease   . Chronic pain    previously saw Dr. Consuela Mimes in pain clinic, then saw pain specialist in [place]  . Depression   . Diabetes mellitus ([place])   . Frequency of urination   . GERD (gastroesophageal reflux disease)   . Headache(784.0)   . High cholesterol   . Hypertension   . IBS (irritable bowel syndrome)   . Left ankle instability   . Left knee DJD   . Lumbar Degenerative Disc Disease of  10/11/2014  . Neuromuscular disorder ([place])   . Osteoarthritis of hip (Right) 05/05/2015  . Other enthesopathy of ankle and tarsus 12/15/2009   Qualifier: Diagnosis of  By: Oneida Alar MD, KARL    . Parkinson's disease ([place])   . Peripheral sensory neuropathy (Bilateral) 11/19/2014  . Postoperative nausea and vomiting   . Schizophrenia [facility])     Patient Active Problem List   Diagnosis Date Noted  . Schizoaffective disorder, depressive type ([place]) 08/17/2018  . PTSD (post-traumatic stress disorder) 08/17/2018  . S/P cardiac catheterization 11/18/2017  . Heart palpitations 10/03/2017  . Asthma, stable, moderate persistent 09/23/2017  . Weight gain due to medication 08/27/2017  . MDD (major depressive disorder), recurrent, severe, with psychosis ([place]) 03/03/2017  . Postmenopausal 10/15/2016  . CKD (chronic kidney disease) stage 2, GFR 60-89 ml/min 10/14/2016  . Muscle spasms of neck 10/14/2016  . Unintended weight gain 10/14/2016  . Contracture, tendon sheath 03/23/2016  . Medication overuse headache 11/12/2015  . Tremor  11/12/2015  . Dysuria 06/17/2015  . Pure hypercholesterolemia 05/29/2015  . Opioid-induced constipation (OIC) 05/19/2015  . Osteoarthritis of hip (Right) 05/08/2015  . Chronic hip pain (Right) 05/05/2015  . Long term prescription opiate use 05/05/2015  . Chronic knee pain (S/P TKR:Total Knee Replacement) (Left) 05/05/2015  . Osteoarthritis of hips (Location of Secondary source of pain) (Bilateral) (Right) 05/05/2015  . History of total knee replacement (Left)  05/05/2015  . Chronic groin pain (Location of Secondary source of pain) (Right) 05/05/2015  . Chronic lower extremity pain (Location of Tertiary source of pain) (Bilateral) (R>L) 05/05/2015  . Chronic pain 02/05/2015  . Chronic ankle pain (secondary to a work-related injury) (Date of injury 04/17/2006) (Left) 12/25/2014  . Chronic low back pain (Location of Primary Pain) (Bilateral) (R>L) 12/25/2014  . Lumbar spondylosis (L3-4 & L4-5) 12/25/2014  . Lumbar facet syndrome (Location of Primary Source of Pain) (Bilateral) (R>L) 12/25/2014  . Encounter for therapeutic drug level monitoring 11/19/2014  . Encounter for long-term opiate analgesic use 11/19/2014  . Long-term current use of opiate analgesic 11/19/2014  . Uncomplicated opioid dependence ([place]) 11/19/2014  . Opiate use (35 MME/Day) 11/19/2014  . Chronic pain syndrome 11/19/2014  . Diabetic sensory peripheral neuropathy (Bilateral Lower Extremity) 11/19/2014  . Generalized anxiety disorder 11/19/2014  . History of panic attacks 11/19/2014  . Insomnia due to mental disorder 11/19/2014  . History of tobacco abuse 11/19/2014  . GERD (gastroesophageal reflux disease) 11/19/2014  . Non-insulin dependent type 2 diabetes mellitus ([place]) 11/19/2014  . Coccygeal pain 11/19/2014  . History of migraine 11/19/2014  . Chronic constipation 10/24/2013  . Personal history of other diseases of the digestive system 10/24/2013  . History of gastroesophageal reflux (GERD) 10/24/2013  . Leg length inequality 09/12/2012  . Morbid obesity ([place]) 05/26/2012  . Hypertension   . IBS (irritable bowel syndrome)   . Depression, major, recurrent, moderate ([place])   . ADVERSE DRUG REACTION 12/15/2009    Past Surgical History:  Procedure Laterality Date  . ABDOMINAL HYSTERECTOMY    . ANKLE SURGERY    . APPENDECTOMY    . COLONOSCOPY  2013  . COLONOSCOPY WITH PROPOFOL N/A 04/25/2017   Procedure: COLONOSCOPY WITH PROPOFOL;  Surgeon: Manya Silvas, MD;   Location: [facility] ENDOSCOPY;  Service: Endoscopy;  Laterality: N/A;  . ESOPHAGOGASTRODUODENOSCOPY (EGD) WITH PROPOFOL N/A 10/03/2014   Procedure: ESOPHAGOGASTRODUODENOSCOPY (EGD) WITH PROPOFOL;  Surgeon: Josefine Class, MD;  Location: [facility] ENDOSCOPY;  Service: Endoscopy;  Laterality: N/A;  . ESOPHAGOGASTRODUODENOSCOPY (EGD) WITH PROPOFOL  04/25/2017   Procedure: ESOPHAGOGASTRODUODENOSCOPY (EGD) WITH PROPOFOL;  Surgeon: Manya Silvas, MD;  Location: [facility] ENDOSCOPY;  Service: Endoscopy;;  . KNEE ARTHROSCOPY  1997   left knee  . LEFT HEART CATH AND CORONARY ANGIOGRAPHY Left 11/10/2017   Procedure: LEFT HEART CATH AND CORONARY ANGIOGRAPHY;  Surgeon: Isaias Cowman, MD;  Location: [facility] CV LAB;  Service: Cardiovascular;  Laterality: Left;  . LEFT HEART CATHETERIZATION WITH CORONARY ANGIOGRAM N/A 01/11/2013   Procedure: LEFT HEART CATHETERIZATION WITH CORONARY ANGIOGRAM;  Surgeon: Sinclair Grooms, MD;  Location: [facility] CATH LAB;  Service: Cardiovascular;  Laterality: N/A;  . TOTAL KNEE ARTHROPLASTY  08/30/2011   Procedure: TOTAL KNEE ARTHROPLASTY;  Surgeon: Lorn Junes, MD;  Location: [facility];  Service: Orthopedics;  Laterality: Left;     OB History   No obstetric history on file.      Home Medications    Prior to Admission medications   Medication Sig Start Date End Date  Taking? Authorizing Provider  acetaminophen (TYLENOL) 500 MG tablet Take 1 tablet (500 mg total) by mouth every 6 (six) hours as needed for mild pain. 03/10/17   Lindell Spar I, NP  albuterol (PROVENTIL HFA;VENTOLIN HFA) 108 (90 Base) MCG/ACT inhaler Inhale 2 puffs into the lungs every 6 (six) hours as needed for wheezing or shortness of breath.    [provider]  aspirin 81 MG tablet Take 81 mg by mouth daily.     [provider]  cetirizine (ZYRTEC) 10 MG tablet Take 10 mg by mouth daily.    [provider]  cholecalciferol (VITAMIN D) 1000 units tablet Take 1,000 Units by mouth  daily.    [provider]  cyclobenzaprine (FLEXERIL) 10 MG tablet Take 10 mg by mouth 2 (two) times daily.     [provider]  diphenhydrAMINE (BENADRYL ALLERGY) 25 mg capsule Take 1 capsule (25 mg total) by mouth daily as needed. For side effects of tremors due to zyprexa 01/30/18   Ursula Alert, MD  docusate sodium (COLACE) 100 MG capsule Take 100 mg by mouth 2 (two) times daily.     [provider]  furosemide (LASIX) 20 MG tablet Take 20 mg by mouth daily.     [provider]  glimepiride (AMARYL) 2 MG tablet Take by mouth. 03/16/18 03/16/19  [provider]  HYDROcodone-acetaminophen (NORCO/VICODIN) 5-325 MG tablet Take 0.5 tablets by mouth 2 (two) times daily. Every afternoon and at bedtime    [provider]  hydroxypropyl methylcellulose / hypromellose (ISOPTO TEARS / GONIOVISC) 2.5 % ophthalmic solution Place 1 drop into both eyes every 4 (four) hours as needed for dry eyes.    [provider]  Melatonin 3 MG TABS Take 1 tablet (3 mg total) by mouth at bedtime. TAKE IT 90 MINUTES PRIOR TO BEDTIME FOR SLEEP 01/30/18   Ursula Alert, MD  metFORMIN (GLUCOPHAGE) 500 MG tablet Take 1 tablet (500 mg total) by mouth 2 (two) times daily with a meal. For diabetes management 03/10/17   Lindell Spar I, NP  omeprazole (PRILOSEC) 40 MG capsule Take 40 mg by mouth daily as needed (heartburn).     [provider]  ondansetron (ZOFRAN) 4 MG tablet Take 4 mg by mouth every 8 (eight) hours as needed for nausea or vomiting.    [provider]  pravastatin (PRAVACHOL) 40 MG tablet Take 40 mg by mouth at bedtime.     [provider]  ranitidine (ZANTAC) 150 MG tablet Take 150 mg by mouth daily as needed for heartburn.     [provider]  risperiDONE (RISPERDAL) 0.5 MG tablet Take 1 tablet (0.5 mg total) by mouth at bedtime. 08/17/18   Ursula Alert, MD  rizatriptan (MAXALT) 10 MG tablet Take 10 mg by mouth 2  (two) times daily. May repeat in 2 hours if needed     [provider]  rOPINIRole (REQUIP XL) 2 MG 24 hr tablet Take by mouth. 11/30/17 12/30/17  [provider]  rOPINIRole (REQUIP) 0.25 MG tablet Take by mouth. 07/27/18   [provider]  senna (SENOKOT) 8.6 MG tablet Take 1-4 tablets (8.6-34.4 mg total) by mouth daily as needed for constipation. Patient taking differently: Take 1 tablet by mouth at bedtime.  03/10/17   Lindell Spar I, NP  topiramate (TOPAMAX) 100 MG tablet Take 0.5 tablets (50 mg total) by mouth at bedtime. For mood control 05/06/17   Ursula Alert, MD  traMADol (ULTRAM) 50 MG tablet  Take 2 tablets (100 mg total) by mouth every 8 (eight) hours as needed for moderate pain or severe pain. Patient taking differently: Take 50-100 mg by mouth See admin instructions. 100 mg every morning, 50 mg every afternoon, and 50 mg at bedtime 03/10/17   Lindell Spar I, NP  verapamil (CALAN-SR) 120 MG CR tablet Take 1 tablet (120 mg total) by mouth at bedtime. For high blood pressure 03/10/17   Nwoko, Herbert Pun I, NP  vitamin B-12 (CYANOCOBALAMIN) 1000 MCG tablet Take 1,000 mcg by mouth daily.    [provider]    Family History Family History  Problem Relation Age of Onset  . Cancer Mother   . Hypertension Father   . Heart disease Father   . Alcohol abuse Father     Social History Social History   Tobacco Use  . Smoking status: Former Smoker    Packs/day: 2.00    Years: 15.00    Pack years: 30.00    Types: Cigarettes    Quit date: 02/09/1995    Years since quitting: 23.5  . Smokeless tobacco: Never Used  Substance Use Topics  . Alcohol use: No    Alcohol/week: 0.0 standard drinks  . Drug use: No     Allergies   Prolixin decanoate [fluphenazine], Benztropine, Buprenorphine hcl, Carbidopa-levodopa, Codeine, Duloxetine, Gabapentin, Lyrica [pregabalin], Morphine and related, Nortripytline hcl [nortriptyline], Nsaids, Penicillins, Tolmetin, and  Wellbutrin [bupropion]   Review of Systems Review of Systems  Constitutional: Negative for appetite change, chills and fever.  HENT: Negative for ear pain, rhinorrhea, sneezing and sore throat.   Eyes: Negative for photophobia and visual disturbance.  Respiratory: Negative for cough, chest tightness, shortness of breath and wheezing.   Cardiovascular: Negative for chest pain and palpitations.  Gastrointestinal: Negative for abdominal pain, blood in stool, constipation, diarrhea, nausea and vomiting.  Genitourinary: Negative for dysuria, hematuria and urgency.  Musculoskeletal: Negative for myalgias.  Skin: Negative for rash.  Neurological: Negative for dizziness, weakness and light-headedness.  Psychiatric/Behavioral: Positive for hallucinations.     Physical Exam Updated Vital Signs BP (!) 166/102 (BP Location: Right Arm)   Pulse (!) 120   Temp 98.9 F (37.2 C) (Oral)   Resp 20   SpO2 97%   Physical Exam Vitals signs and nursing note reviewed.  Constitutional:      General: She is not in acute distress.    Appearance: She is well-developed.  HENT:     Head: Normocephalic and atraumatic.     Nose: Nose normal.  Eyes:     General: No scleral icterus.       Left eye: No discharge.     Conjunctiva/sclera: Conjunctivae normal.  Neck:     Musculoskeletal: Normal range of motion and neck supple.  Cardiovascular:     Rate and Rhythm: Normal rate and regular rhythm.     Heart sounds: Normal heart sounds. No murmur. No friction rub. No gallop.   Pulmonary:     Effort: Pulmonary effort is normal. No respiratory distress.     Breath sounds: Normal breath sounds.  Abdominal:     General: Bowel sounds are normal. There is no distension.     Palpations: Abdomen is soft.     Tenderness: There is no abdominal tenderness. There is no guarding.  Musculoskeletal: Normal range of motion.  Skin:    General: Skin is warm and dry.     Findings: No rash.  Neurological:     Mental  Status: She is alert.  Motor: No abnormal muscle tone.     Coordination: Coordination normal.  Psychiatric:        Mood and Affect: Affect is blunt.        Speech: Speech normal.        Thought Content: Thought content is delusional. Thought content does not include homicidal or suicidal ideation. Thought content does not include homicidal or suicidal plan.      ED Treatments / Results  Labs (all labs ordered are listed, but only abnormal results are displayed) Labs Reviewed  COMPREHENSIVE METABOLIC PANEL - Abnormal; Notable for the following components:      Result Value   Potassium 3.3 (*)    CO2 19 (*)    Glucose, Bld 155 (*)    Creatinine, Ser 1.06 (*)    Total Protein 8.3 (*)    GFR calc non Af Amer 57 (*)    Anion gap 17 (*)    All other components within normal limits  CBC WITH DIFFERENTIAL/PLATELET - Abnormal; Notable for the following components:   RBC 5.51 (*)    Hemoglobin 15.8 (*)    HCT 50.2 (*)    All other components within normal limits  ACETAMINOPHEN LEVEL - Abnormal; Notable for the following components:   Acetaminophen (Tylenol), Serum <10 (*)    All other components within normal limits  ETHANOL  SALICYLATE LEVEL  RAPID URINE DRUG SCREEN, HOSP PERFORMED    EKG None  Radiology No results found.  Procedures Procedures (including critical care time)  Medications Ordered in ED Medications  sodium chloride 0.9 % bolus 1,000 mL (has no administration in time range)  potassium chloride SA (K-DUR) CR tablet 40 mEq (has no administration in time range)     Initial Impression / Assessment and Plan / ED Course  I have reviewed the triage vital signs and the nursing notes.  Pertinent labs & imaging results that were available during my care of the patient were reviewed by me and considered in my medical decision making (see chart for details).        58 year old female with past medical history of schizophrenia presents to ED under IVC.   [name], who appears to be her daughter states that she has been noncompliant with her medication, has been hearing voices, abusing her medication, been getting more angry and aggressive.  Patient herself is unsure why she is here.  She is waiting for [employer].  She denies any somatic complaints.  Lab work significant for anion gap of 17, potassium of 3.3.  Mildly tachycardic on exam.  She will be given IV fluids and p.o. potassium.  After this, feel that she is medically cleared for TTS evaluation.  Final Clinical Impressions(s) / ED Diagnoses   Final diagnoses:  None    ED Discharge Orders    None       Delia Heady, PA-C 08/21/18 1715    Carmin Muskrat, MD 08/22/18 1515

## 2018-08-21 NOTE — Progress Notes (Signed)
Pt meets inpatient criteria per Dr. Leilani Merl. Referral information has been sent to the following hospitals for review:  [facility]  [facility]       Disposition will continue to follow for inpatient placement needs.   Audree Camel, LCSW, [place] Disposition [place] [facility]/TTS [phone]

## 2018-08-22 ENCOUNTER — Inpatient Hospital Stay (HOSPITAL_COMMUNITY)
Admission: AD | Admit: 2018-08-22 | Discharge: 2018-08-28 | DRG: 885 | Disposition: A | Discharge status: Home / Self Care | Payer: Medicare HMO | Source: Intra-hospital | Attending: Psychiatry | Admitting: Psychiatry

## 2018-08-22 ENCOUNTER — Encounter (HOSPITAL_COMMUNITY): Payer: Self-pay | Admitting: Registered Nurse

## 2018-08-22 DIAGNOSIS — Z20828 Contact with and (suspected) exposure to other viral communicable diseases: Secondary | ICD-10-CM | POA: Diagnosis not present

## 2018-08-22 DIAGNOSIS — N182 Chronic kidney disease, stage 2 (mild): Secondary | ICD-10-CM | POA: Diagnosis not present

## 2018-08-22 DIAGNOSIS — Z7982 Long term (current) use of aspirin: Secondary | ICD-10-CM | POA: Diagnosis not present

## 2018-08-22 DIAGNOSIS — Z1159 Encounter for screening for other viral diseases: Secondary | ICD-10-CM

## 2018-08-22 DIAGNOSIS — Z7984 Long term (current) use of oral hypoglycemic drugs: Secondary | ICD-10-CM | POA: Diagnosis not present

## 2018-08-22 DIAGNOSIS — F202 Catatonic schizophrenia: Principal | ICD-10-CM | POA: Diagnosis present

## 2018-08-22 DIAGNOSIS — N189 Chronic kidney disease, unspecified: Secondary | ICD-10-CM | POA: Diagnosis not present

## 2018-08-22 DIAGNOSIS — Z9114 Patient's other noncompliance with medication regimen: Secondary | ICD-10-CM | POA: Diagnosis not present

## 2018-08-22 DIAGNOSIS — G20 Parkinson's disease: Secondary | ICD-10-CM | POA: Diagnosis not present

## 2018-08-22 DIAGNOSIS — F209 Schizophrenia, unspecified: Secondary | ICD-10-CM | POA: Diagnosis present

## 2018-08-22 DIAGNOSIS — Z87891 Personal history of nicotine dependence: Secondary | ICD-10-CM

## 2018-08-22 DIAGNOSIS — J449 Chronic obstructive pulmonary disease, unspecified: Secondary | ICD-10-CM | POA: Diagnosis present

## 2018-08-22 DIAGNOSIS — Z79899 Other long term (current) drug therapy: Secondary | ICD-10-CM | POA: Diagnosis not present

## 2018-08-22 DIAGNOSIS — E1122 Type 2 diabetes mellitus with diabetic chronic kidney disease: Secondary | ICD-10-CM | POA: Diagnosis not present

## 2018-08-22 DIAGNOSIS — I129 Hypertensive chronic kidney disease with stage 1 through stage 4 chronic kidney disease, or unspecified chronic kidney disease: Secondary | ICD-10-CM | POA: Diagnosis not present

## 2018-08-22 DIAGNOSIS — K219 Gastro-esophageal reflux disease without esophagitis: Secondary | ICD-10-CM | POA: Diagnosis present

## 2018-08-22 DIAGNOSIS — F200 Paranoid schizophrenia: Secondary | ICD-10-CM | POA: Diagnosis not present

## 2018-08-22 DIAGNOSIS — J45909 Unspecified asthma, uncomplicated: Secondary | ICD-10-CM | POA: Diagnosis not present

## 2018-08-22 LAB — RAPID URINE DRUG SCREEN, HOSP PERFORMED
Amphetamines: NOT DETECTED
Barbiturates: NOT DETECTED
Benzodiazepines: NOT DETECTED
Cocaine: NOT DETECTED
Opiates: POSITIVE — AB
Tetrahydrocannabinol: NOT DETECTED

## 2018-08-22 LAB — LIPID PANEL
Cholesterol: 146 mg/dL (ref 0–200)
HDL: 49 mg/dL (ref >40)
LDL Cholesterol: 63 mg/dL (ref 0–99)
Total CHOL/HDL Ratio: 3 RATIO
Triglycerides: 172 mg/dL — ABNORMAL HIGH (ref <150)
VLDL: 34 mg/dL (ref 0–40)

## 2018-08-22 LAB — SARS CORONAVIRUS 2 BY RT PCR (HOSPITAL ORDER, PERFORMED IN ~~LOC~~ HOSPITAL LAB): SARS Coronavirus 2: NEGATIVE

## 2018-08-22 LAB — TSH: TSH: 0.959 u[IU]/mL (ref 0.350–4.500)

## 2018-08-22 NOTE — BH Assessment (Signed)
[facility] Assessment Progress Note  Per Buford Dresser, DO, this pt requires psychiatric hospitalization.  Letitia Libra, RN, [facility] has assigned pt to [facility] Rm 500-1; [facility] will be ready to receive pt at 20:30.  Pt presents under IVC initiated by pt's daughter, and upheld by Ignacia Marvel, LCSW, and IVC documents have been faxed to [facility].  Pt's nurse, Tilda Franco, has been notified, and agrees to call report to [phone].  Pt is to be transported via [employer].   Jalene Mullet, [place] Coordinator [phone]

## 2018-08-22 NOTE — Progress Notes (Signed)
Received patient in her room sitting on the side of the bed. She stated she is doing fine. She was informed of the plan to be transferred to [facility] tonight. Report was called to nurse Miquel Dunn and the sheriff's department was notified for transport.  She was allowed to put her leg brace o0n and shoes. She was escorted via wheelchair without incident at 2153 hrs. Her daughter called at transport and was informed of her discharge.

## 2018-08-22 NOTE — Progress Notes (Signed)
08/22/2018  1435  Labs drawn. Sent light green and gold top to main lab. Covid 19 test completed and sent to main lab.

## 2018-08-22 NOTE — Progress Notes (Signed)
Pt accepted to  [facility], Bed 500-1 Shuvon Rankin, NP is the accepting provider.  Johnn Hai, MD is the attending provider.  Call report to [phone]  [facility] ED notified.   Pt is IVC  Pt may be transported by [employer] Pt scheduled  to arrive at [place]. Judi Cong, MSW, [place] Disposition Clinical Social Work [phone] (cell) [phone] (office)

## 2018-08-22 NOTE — Discharge Summary (Addendum)
  Patient to be transferred to [facility] for inpatient psychiatric treatment  Shuvon B. Rankin, NP  Patient seen by telemedicine for psychiatric evaluation, chart reviewed and case discussed with the physician extender and developed treatment plan. Reviewed the information documented and agree with the treatment plan.  Buford Dresser, DO 08/22/18 5:25 PM

## 2018-08-23 ENCOUNTER — Other Ambulatory Visit: Payer: Self-pay

## 2018-08-23 ENCOUNTER — Encounter (HOSPITAL_COMMUNITY): Payer: Self-pay

## 2018-08-23 DIAGNOSIS — F200 Paranoid schizophrenia: Secondary | ICD-10-CM

## 2018-08-23 MED ORDER — MIRTAZAPINE 15 MG PO TBDP
15.0000 mg | ORAL_TABLET | Freq: Every day | ORAL | Status: DC
  Filled 2018-08-23 (×3): qty 1

## 2018-08-23 MED ORDER — RISPERIDONE 3 MG PO TABS
3.0000 mg | ORAL_TABLET | Freq: Two times a day (BID) | ORAL | Status: DC
  Administered 2018-08-28: 3 mg via ORAL
  Filled 2018-08-23 (×14): qty 1

## 2018-08-23 MED ORDER — AMANTADINE HCL 100 MG PO CAPS
100.0000 mg | ORAL_CAPSULE | Freq: Two times a day (BID) | ORAL | Status: DC
  Administered 2018-08-27 – 2018-08-28 (×3): 100 mg via ORAL
  Filled 2018-08-23 (×14): qty 1

## 2018-08-23 MED ORDER — LORAZEPAM 1 MG PO TABS
1.0000 mg | ORAL_TABLET | Freq: Three times a day (TID) | ORAL | Status: DC
  Filled 2018-08-23 (×2): qty 1

## 2018-08-23 MED ORDER — MODAFINIL 100 MG PO TABS
100.0000 mg | ORAL_TABLET | Freq: Every day | ORAL | Status: DC
  Administered 2018-08-28: 100 mg via ORAL
  Filled 2018-08-23 (×4): qty 1

## 2018-08-23 NOTE — Tx Team (Signed)
Interdisciplinary Treatment and Diagnostic Plan Update  08/23/2018 Time of Session: 9:00am Heather Haley MRN: [id number]  Principal Diagnosis: <principal problem not specified>  Secondary Diagnoses: Active Problems:   Schizophrenia ([place])   Current Medications:  No current facility-administered medications for this encounter.    PTA Medications: Medications Prior to Admission  Medication Sig Dispense Refill Last Dose  . acetaminophen (TYLENOL) 500 MG tablet Take 1 tablet (500 mg total) by mouth every 6 (six) hours as needed for mild pain. 30 tablet 0   . albuterol (PROVENTIL HFA;VENTOLIN HFA) 108 (90 Base) MCG/ACT inhaler Inhale 2 puffs into the lungs every 6 (six) hours as needed for wheezing or shortness of breath.     Marland Kitchen aspirin 81 MG tablet Take 81 mg by mouth daily.      . carbidopa-levodopa (SINEMET IR) 25-100 MG tablet Take 1 tablet by mouth 3 (three) times daily.     . cetirizine (ZYRTEC) 10 MG tablet Take 10 mg by mouth daily.     . cholecalciferol (VITAMIN D) 1000 units tablet Take 1,000 Units by mouth daily.     . cyclobenzaprine (FLEXERIL) 10 MG tablet Take 10 mg by mouth 2 (two) times daily.      . diphenhydrAMINE (BENADRYL ALLERGY) 25 mg capsule Take 1 capsule (25 mg total) by mouth daily as needed. For side effects of tremors due to zyprexa 30 capsule 2   . docusate sodium (COLACE) 100 MG capsule Take 100 mg by mouth 2 (two) times daily.      . furosemide (LASIX) 20 MG tablet Take 20 mg by mouth daily.      Marland Kitchen glimepiride (AMARYL) 1 MG tablet Take 1 mg by mouth daily with breakfast.      . HYDROcodone-acetaminophen (NORCO/VICODIN) 5-325 MG tablet Take 0.5 tablets by mouth 2 (two) times daily. Every afternoon and at bedtime     . hydroxypropyl methylcellulose / hypromellose (ISOPTO TEARS / GONIOVISC) 2.5 % ophthalmic solution Place 1 drop into both eyes every 4 (four) hours as needed for dry eyes.     Marland Kitchen losartan (COZAAR) 25 MG tablet Take 25 mg by mouth daily.     .  Melatonin 3 MG TABS Take 1 tablet (3 mg total) by mouth at bedtime. TAKE IT 90 MINUTES PRIOR TO BEDTIME FOR SLEEP 100 tablet 0   . metFORMIN (GLUCOPHAGE) 500 MG tablet Take 1 tablet (500 mg total) by mouth 2 (two) times daily with a meal. For diabetes management 10 tablet 0   . omeprazole (PRILOSEC) 40 MG capsule Take 40 mg by mouth daily as needed (heartburn).      . ondansetron (ZOFRAN) 4 MG tablet Take 4 mg by mouth every 8 (eight) hours as needed for nausea or vomiting.     . pravastatin (PRAVACHOL) 40 MG tablet Take 40 mg by mouth at bedtime.      . ranitidine (ZANTAC) 150 MG tablet Take 150 mg by mouth daily as needed for heartburn.      . risperiDONE (RISPERDAL) 0.5 MG tablet Take 1 tablet (0.5 mg total) by mouth at bedtime. 90 tablet 0   . rizatriptan (MAXALT) 10 MG tablet Take 10 mg by mouth 2 (two) times daily. May repeat in 2 hours if needed      . rOPINIRole (REQUIP) 0.25 MG tablet Take by mouth.     . senna (SENOKOT) 8.6 MG tablet Take 1-4 tablets (8.6-34.4 mg total) by mouth daily as needed for constipation. (Patient taking differently: Take 1 tablet  by mouth at bedtime. )     . topiramate (TOPAMAX) 100 MG tablet Take 0.5 tablets (50 mg total) by mouth at bedtime. For mood control 30 tablet 1   . traMADol (ULTRAM) 50 MG tablet Take 2 tablets (100 mg total) by mouth every 8 (eight) hours as needed for moderate pain or severe pain. (Patient taking differently: Take 50-100 mg by mouth See admin instructions. 100 mg every morning, 50 mg every afternoon, and 50 mg at bedtime) 180 tablet 2   . verapamil (CALAN-SR) 120 MG CR tablet Take 1 tablet (120 mg total) by mouth at bedtime. For high blood pressure 90 tablet 1   . vitamin B-12 (CYANOCOBALAMIN) 1000 MCG tablet Take 1,000 mcg by mouth daily.       Patient Stressors:    Patient Strengths:    Treatment Modalities: Medication Management, Group therapy, Case management,  1 to 1 session with clinician, Psychoeducation, Recreational  therapy.   Physician Treatment Plan for Primary Diagnosis: <principal problem not specified> Long Term Goal(s):     Short Term Goals:    Medication Management: Evaluate patient's response, side effects, and tolerance of medication regimen.  Therapeutic Interventions: 1 to 1 sessions, Unit Group sessions and Medication administration.  Evaluation of Outcomes: Not Met  Physician Treatment Plan for Secondary Diagnosis: Active Problems:   Schizophrenia ([place])  Long Term Goal(s):     Short Term Goals:       Medication Management: Evaluate patient's response, side effects, and tolerance of medication regimen.  Therapeutic Interventions: 1 to 1 sessions, Unit Group sessions and Medication administration.  Evaluation of Outcomes: Not Met   RN Treatment Plan for Primary Diagnosis: <principal problem not specified> Long Term Goal(s): Knowledge of disease and therapeutic regimen to maintain health will improve  Short Term Goals: Ability to participate in decision making will improve, Ability to verbalize feelings will improve, Ability to disclose and discuss suicidal ideas, Ability to identify and develop effective coping behaviors will improve and Compliance with prescribed medications will improve  Medication Management: RN will administer medications as ordered by provider, will assess and evaluate patient's response and provide education to patient for prescribed medication. RN will report any adverse and/or side effects to prescribing provider.  Therapeutic Interventions: 1 on 1 counseling sessions, Psychoeducation, Medication administration, Evaluate responses to treatment, Monitor vital signs and CBGs as ordered, Perform/monitor CIWA, COWS, AIMS and Fall Risk screenings as ordered, Perform wound care treatments as ordered.  Evaluation of Outcomes: Not Met   LCSW Treatment Plan for Primary Diagnosis: <principal problem not specified> Long Term Goal(s): Safe transition to  appropriate next level of care at discharge, Engage patient in therapeutic group addressing interpersonal concerns.  Short Term Goals: Engage patient in aftercare planning with referrals and resources and Increase skills for wellness and recovery  Therapeutic Interventions: Assess for all discharge needs, 1 to 1 time with Social worker, Explore available resources and support systems, Assess for adequacy in community support network, Educate family and significant other(s) on suicide prevention, Complete Psychosocial Assessment, Interpersonal group therapy.  Evaluation of Outcomes: Not Met   Progress in Treatment: Attending groups: No. Participating in groups: No. Taking medication as prescribed: As evidenced by:  No medications listed, yet Toleration medication: As evidenced by:  No medications listed, yet Family/Significant other contact made: No, will contact:  will contact if consent is given Patient understands diagnosis: No. Discussing patient identified problems/goals with staff: Yes. Medical problems stabilized or resolved: Yes. Denies suicidal/homicidal ideation: Yes. Issues/concerns per  patient self-inventory: No. Other:   New problem(s) identified: No, Describe:  None  New Short Term/Long Term Goal(s): Medication stabilization, elimination of SI thoughts, and development of a comprehensive mental wellness plan.   Patient Goals:    Discharge Plan or Barriers: CSW will continue to follow up for appropriate referrals and possible discharge planning  Reason for Continuation of Hospitalization: Aggression Hallucinations Medication stabilization  Estimated Length of Stay: 3-5 days  Attendees: Patient: 08/23/2018   Physician: Dr. Johnn Hai, MD 08/23/2018   Nursing: Selinda Eon, [place] 7[birth year]51/9824   RN Care Manager: 08/23/2018   Social Worker: Ardelle Anton, LCSW 08/23/2018   Recreational Therapist:  08/23/2018  Other:  08/23/2018   Other:  08/23/2018  Other: 08/23/2018       Scribe for Treatment Team: Trecia Rogers, LCSW 08/23/2018 11:03 AM

## 2018-08-23 NOTE — Progress Notes (Signed)
Nursing 1:1 note D:Pt observed sitting in bed. RR even and unlabored. No distress noted. Pt irritable, pt refused medication. Pt stated " I don't want to talk to you". Pt tried to state she was with pain management and she could not take any other medication.  A: 1:1 observation continues for safety  R: pt remains safe
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
## 2018-08-23 NOTE — Progress Notes (Signed)
Nursing 1:1 note D:Pt laying  in bed with eyes closed. RR even and unlabored. No distress noted. Pt irritable and agitated this evening. " I don't want to talk to you" . Pt stated she had pain management,  And could not take any other medicines .  A: 1:1 observation continues for safety  R: pt remains safe

## 2018-08-23 NOTE — H&P (Signed)
Psychiatric Admission Assessment Adult  Patient Identification: Heather Haley MRN:  [id number] Date of Evaluation:  08/23/2018 Chief Complaint:  schizophrenia Principal Diagnosis: <principal problem not specified> Diagnosis:  Active Problems:   Schizophrenia ([place])  History of Present Illness:  Ms. Heather Haley is 58 years of age she has a known history of a schizophrenic condition thought to be paranoid type, she is noncompliant and difficult interview due to her elective mutism/intermittent mutism during the interview.  Further history reveals that the patient had decompensated fairly recently and has been suffering from hallucinations both auditory and visual and making delusional statements. Previously was treated in January 2019 with a similar presentation.  At that point in time stabilized with oral aripiprazole   According to Eval: Heather Haley is an 58 y.o. female that presents this date with IVC. Per IVC: "[name] has been diagnosed with Schizophrenia but has not been taking her medication. [name] has not been sleeping and tending to her personal hygiene. [name] has been hallucinating and hearing voices. [name] drove to a police substation this date and asked officers, "Where is the golden angel." [name] kept telling officers to "get that man out of my back seat" when no one was in the car. [name] told officers that she was giving her car to [employer]. [name] believes that respondent may be abusing her medication. [name] is a danger to self and others." Patient presents with a agitated affect and is responding to internal stimuli as evidenced by patient pointing to the empty chair in the room and having conversations with them. This writer attempts to clarify who she is speaking to unsuccessfully. Patient does not seem to process the content of this writer's questions. Patient does not acknowledge this writer's presence in the room and will not respond  to questions. Information to complete assessment was obtained from admission notes and collateral from petitioner (daughter Heather Haley [phone]) who reported she last saw the patient on Friday 08/18/18. Daughter reported her mother was fine at that time and has decompensated over the weekend. Daughter reported her mother showed up at a police substation and gave her phone to an officer who found the daughter's phone number and contacted her. Daughter is unsure when her mother was diagnosed with her mental health disorder or where she obtains her medications from. Daughter reported that mother had no history of SA use.Per chart review patient was last seen on 03/02/17 when she presented with similar symptoms. Per that note on 03/02/17 patient had a noted history of schizophrenia with psychosis stating at that time "people are living under my house and electrocuting me."Per notes this date patient is brought in by [employer] under IVC papers that state: "[name] states: the respondent has been diagnosed with schizophrenia but has not been taking her medication. The respondent has not been sleeping and she has not been tending to her personal hygiene. The respondent has been hallucinating and hearing voices. Today the respondent drive into a police substation and asked the officers, "Where is the golden angel". When officers ask that she exit the car she told them to, "get the man out of the back seat first". There was no one in the back seat. The respondent also told the petitioner, that she was giving up her car to [employer]. In addition, she told the officers to take a picture of her because petitioner was going to kill her. When petitioner and respondent arrived at respondent's home respondent told her the house/yard was full  of people that had been partying all night. [name] advised no one was present at the home. Finally, petitioner believes respondent is abusing her medication  which causes her to get angry, aggressive and threatening".History on patient is limited. Case was staffed with Mariea Clonts DO who recommended a inpatient admission to assist with stabilization.    Associated Signs/Symptoms: Depression Symptoms:  depressed mood, (Hypo) Manic Symptoms:  Delusions, Anxiety Symptoms:  Social Anxiety, Psychotic Symptoms:  Paranoia, PTSD Symptoms: NA Total Time spent with patient: 45 minutes  Past Psychiatric History: Patient does not recall long-acting injectable medications  Is the patient at risk to self? Yes.    Has the patient been a risk to self in the past 6 months? No.  Has the patient been a risk to self within the distant past? Yes.    Is the patient a risk to others? No.  Has the patient been a risk to others in the past 6 months? No.  Has the patient been a risk to others within the distant past? No.   Prior Inpatient Therapy:  As discussed last admission in 2019 prior Outpatient Therapy:  Has been compliant by report  Alcohol Screening: 1. How often do you have a drink containing alcohol?: Never 2. How many drinks containing alcohol do you have on a typical day when you are drinking?: 1 or 2 3. How often do you have six or more drinks on one occasion?: Never AUDIT-C Score: 0 9. Have you or someone else been injured as a result of your drinking?: No 10. Has a relative or friend or a doctor or another health worker been concerned about your drinking or suggested you cut down?: No Alcohol Use Disorder Identification Test Final Score (AUDIT): 0 Substance Abuse History in the last 12 months:  No. Consequences of Substance Abuse: NA Previous Psychotropic Medications: Yes  Psychological Evaluations: No  Past Medical History:  Past Medical History:  Diagnosis Date  . Anxiety   . Chronic kidney disease   . Chronic pain    previously saw Dr. Consuela Mimes in pain clinic, then saw pain specialist in [place]  . Depression   . Diabetes mellitus ([place])    . Frequency of urination   . GERD (gastroesophageal reflux disease)   . Headache(784.0)   . High cholesterol   . Hypertension   . IBS (irritable bowel syndrome)   . Left ankle instability   . Left knee DJD   . Lumbar Degenerative Disc Disease of  10/11/2014  . Neuromuscular disorder ([place])   . Osteoarthritis of hip (Right) 05/05/2015  . Other enthesopathy of ankle and tarsus 12/15/2009   Qualifier: Diagnosis of  By: Oneida Alar MD, KARL    . Parkinson's disease ([place])   . Peripheral sensory neuropathy (Bilateral) 11/19/2014  . Postoperative nausea and vomiting   . Schizophrenia [facility])     Past Surgical History:  Procedure Laterality Date  . ABDOMINAL HYSTERECTOMY    . ANKLE SURGERY    . APPENDECTOMY    . COLONOSCOPY  2013  . COLONOSCOPY WITH PROPOFOL N/A 04/25/2017   Procedure: COLONOSCOPY WITH PROPOFOL;  Surgeon: Manya Silvas, MD;  Location: [facility] ENDOSCOPY;  Service: Endoscopy;  Laterality: N/A;  . ESOPHAGOGASTRODUODENOSCOPY (EGD) WITH PROPOFOL N/A 10/03/2014   Procedure: ESOPHAGOGASTRODUODENOSCOPY (EGD) WITH PROPOFOL;  Surgeon: Josefine Class, MD;  Location: [facility] ENDOSCOPY;  Service: Endoscopy;  Laterality: N/A;  . ESOPHAGOGASTRODUODENOSCOPY (EGD) WITH PROPOFOL  04/25/2017   Procedure: ESOPHAGOGASTRODUODENOSCOPY (EGD) WITH PROPOFOL;  Surgeon: Manya Silvas, MD;  Location: [facility] ENDOSCOPY;  Service: Endoscopy;;  . KNEE ARTHROSCOPY  1997   left knee  . LEFT HEART CATH AND CORONARY ANGIOGRAPHY Left 11/10/2017   Procedure: LEFT HEART CATH AND CORONARY ANGIOGRAPHY;  Surgeon: Isaias Cowman, MD;  Location: [place] CV LAB;  Service: Cardiovascular;  Laterality: Left;  . LEFT HEART CATHETERIZATION WITH CORONARY ANGIOGRAM N/A 01/11/2013   Procedure: LEFT HEART CATHETERIZATION WITH CORONARY ANGIOGRAM;  Surgeon: Sinclair Grooms, MD;  Location: [facility] CATH LAB;  Service: Cardiovascular;  Laterality: N/A;  . TOTAL KNEE ARTHROPLASTY  08/30/2011   Procedure: TOTAL KNEE ARTHROPLASTY;   Surgeon: Lorn Junes, MD;  Location: [place];  Service: Orthopedics;  Laterality: Left;   Family History:  Family History  Problem Relation Age of Onset  . Cancer Mother   . Hypertension Father   . Heart disease Father   . Alcohol abuse Father    Family Psychiatric  History: ukn Tobacco Screening:   Social History:  Social History   Substance and Sexual Activity  Alcohol Use No  . Alcohol/week: 0.0 standard drinks     Social History   Substance and Sexual Activity  Drug Use No    Additional Social History:                           Allergies:   Allergies  Allergen Reactions  . Prolixin Decanoate [Fluphenazine]   . Benztropine Other (See Comments)    Hair fall out  Suicide thoughts  . Buprenorphine Hcl Other (See Comments)    Unable to void  . Carbidopa-Levodopa Nausea Only  . Codeine Hives    REACTION: hives  . Duloxetine Other (See Comments)    Altered mental status Alopecia, visual hallucinations, nightmares  . Gabapentin Swelling  . Lyrica [Pregabalin] Other (See Comments)    Alopecia, visual hallucinations, nightmares Altered mental status  . Morphine And Related Other (See Comments)    Unable to void  . Nortripytline Hcl [Nortriptyline] Other (See Comments)    Hair loss and night mares   . Nsaids     REACTION: palpitations, diaphoresis  . Penicillins Nausea And Vomiting and Other (See Comments)    REACTION: upset stomach Has patient had a PCN reaction causing immediate rash, facial/tongue/throat swelling, SOB or lightheadedness with hypotension: No Has patient had a PCN reaction causing severe rash involving mucus membranes or skin necrosis: No Has patient had a PCN reaction that required hospitalization: No Has patient had a PCN reaction occurring within the last 10 years: No If all of the above answers are "NO", then may proceed with Cephalosporin use.  . Tolmetin Other (See Comments)    REACTION: palpitations, diaphoresis  .  Wellbutrin [Bupropion]     Constipation, mood swings   Lab Results:  Results for orders placed or performed during the hospital encounter of 08/21/18 (from the past 48 hour(s))  Urine rapid drug screen (hosp performed)     Status: Abnormal   Collection Time: 08/21/18  2:04 PM  Result Value Ref Range   Opiates POSITIVE (A) NONE DETECTED   Cocaine NONE DETECTED NONE DETECTED   Benzodiazepines NONE DETECTED NONE DETECTED   Amphetamines NONE DETECTED NONE DETECTED   Tetrahydrocannabinol NONE DETECTED NONE DETECTED   Barbiturates NONE DETECTED NONE DETECTED    Comment: (NOTE) DRUG SCREEN FOR MEDICAL PURPOSES ONLY.  IF CONFIRMATION IS NEEDED FOR ANY PURPOSE, NOTIFY LAB WITHIN 5 DAYS. LOWEST DETECTABLE LIMITS FOR URINE DRUG SCREEN Drug Class  Cutoff (ng/mL) Amphetamine and metabolites    1000 Barbiturate and metabolites    200 Benzodiazepine                 856 Tricyclics and metabolites     300 Opiates and metabolites        300 Cocaine and metabolites        300 THC                            50 Performed at [facility], [place] [address], [place], [place] [address]   Comprehensive metabolic panel     Status: Abnormal   Collection Time: 08/21/18  2:33 PM  Result Value Ref Range   Sodium 141 135 - 145 mmol/L   Potassium 3.3 (L) 3.5 - 5.1 mmol/L   Chloride 105 98 - 111 mmol/L   CO2 19 (L) 22 - 32 mmol/L   Glucose, Bld 155 (H) 70 - 99 mg/dL   BUN 16 6 - 20 mg/dL   Creatinine, Ser 1.06 (H) 0.44 - 1.00 mg/dL   Calcium 9.7 8.9 - 10.3 mg/dL   Total Protein 8.3 (H) 6.5 - 8.1 g/dL   Albumin 4.7 3.5 - 5.0 g/dL   AST 36 15 - 41 U/L   ALT 39 0 - 44 U/L   Alkaline Phosphatase 89 38 - 126 U/L   Total Bilirubin 1.1 0.3 - 1.2 mg/dL   GFR calc non Af Amer 57 (L) >60 mL/min   GFR calc Af Amer >60 >60 mL/min   Anion gap 17 (H) 5 - 15    Comment: Performed at [facility], [place] [address], [place], [place] [address]  Ethanol      Status: None   Collection Time: 08/21/18  2:33 PM  Result Value Ref Range   Alcohol, Ethyl (B) <10 <10 mg/dL    Comment: (NOTE) Lowest detectable limit for serum alcohol is 10 mg/dL. For medical purposes only. Performed at [facility], [place] [address] [place], [place] [address]   CBC with Diff     Status: Abnormal   Collection Time: 08/21/18  2:33 PM  Result Value Ref Range   WBC 10.4 4.0 - 10.5 K/uL   RBC 5.51 (H) 3.87 - 5.11 MIL/uL   Hemoglobin 15.8 (H) 12.0 - 15.0 g/dL   HCT 50.2 (H) 36.0 - 46.0 %   MCV 91.1 80.0 - 100.0 fL   MCH 28.7 26.0 - 34.0 pg   MCHC 31.5 30.0 - 36.0 g/dL   RDW 14.3 11.5 - 15.5 %   Platelets 284 150 - 400 K/uL   nRBC 0.0 0.0 - 0.2 %   Neutrophils Relative % 67 %   Neutro Abs 7.0 1.7 - 7.7 K/uL   Lymphocytes Relative 24 %   Lymphs Abs 2.5 0.7 - 4.0 K/uL   Monocytes Relative 7 %   Monocytes Absolute 0.8 0.1 - 1.0 K/uL   Eosinophils Relative 1 %   Eosinophils Absolute 0.1 0.0 - 0.5 K/uL   Basophils Relative 1 %   Basophils Absolute 0.1 0.0 - 0.1 K/uL   Immature Granulocytes 0 %   Abs Immature Granulocytes 0.04 0.00 - 0.07 K/uL    Comment: Performed at [facility], [place] [address] [place],  [address]  Salicylate level     Status: None   Collection Time: 08/21/18  2:33 PM  Result Value Ref Range   Salicylate  Lvl <7.0 2.8 - 30.0 mg/dL    Comment: Performed at [facility], [place] [address] [place], [place] [address]  Acetaminophen level     Status: Abnormal   Collection Time: 08/21/18  2:33 PM  Result Value Ref Range   Acetaminophen (Tylenol), Serum <10 (L) 10 - 30 ug/mL    Comment: (NOTE) Therapeutic concentrations vary significantly. A range of 10-30 ug/mL  may be an effective concentration for many patients. However, some  are best treated at concentrations outside of this range. Acetaminophen concentrations >150 ug/mL at 4 hours after ingestion  and >50 ug/mL at 12 hours after  ingestion are often associated with  toxic reactions. Performed at [facility], [place] [address], [place], [place] [address]   TSH     Status: None   Collection Time: 08/22/18  2:35 PM  Result Value Ref Range   TSH 0.959 0.350 - 4.500 uIU/mL    Comment: Performed by a 3rd Generation assay with a functional sensitivity of <=0.01 uIU/mL. Performed at [facility], [place] [address] [place], [place] [address]   Lipid panel     Status: Abnormal   Collection Time: 08/22/18  2:35 PM  Result Value Ref Range   Cholesterol 146 0 - 200 mg/dL   Triglycerides 172 (H) <150 mg/dL   HDL 49 >40 mg/dL   Total CHOL/HDL Ratio 3.0 RATIO   VLDL 34 0 - 40 mg/dL   LDL Cholesterol 63 0 - 99 mg/dL    Comment:        Total Cholesterol/HDL:CHD Risk Coronary Heart Disease Risk Table                     Men   Women  1/2 Average Risk   3.4   3.3  Average Risk       5.0   4.4  2 X Average Risk   9.6   7.1  3 X Average Risk  23.4   11.0        Use the calculated Patient Ratio above and the CHD Risk Table to determine the patient's CHD Risk.        ATP III CLASSIFICATION (LDL):  <100     mg/dL   Optimal  100-129  mg/dL   Near or Above                    Optimal  130-159  mg/dL   Borderline  160-189  mg/dL   High  >190     mg/dL   Very High Performed at [place] [address], [place], [place] [address]   SARS Coronavirus 2 (CEPHEID - Performed in [place] hospital lab), Hosp Order     Status: None   Collection Time: 08/22/18  2:41 PM   Specimen: Nasopharyngeal Swab  Result Value Ref Range   SARS Coronavirus 2 NEGATIVE NEGATIVE    Comment: (NOTE) If result is NEGATIVE SARS-CoV-2 target nucleic acids are NOT DETECTED. The SARS-CoV-2 RNA is generally detectable in upper and lower  respiratory specimens during the acute phase of infection. The lowest  concentration of SARS-CoV-2 viral copies this assay can detect is 250  copies / mL. A  negative result does not preclude SARS-CoV-2 infection  and should not be used as the sole basis for treatment or other  patient management decisions.  A negative result may occur with  improper specimen collection / handling, submission of specimen other  than nasopharyngeal swab, presence of viral mutation(s) within the  areas targeted by this assay, and inadequate number of viral copies  (<250 copies / mL). A negative result must be combined with clinical  observations, patient history, and epidemiological information. If result is POSITIVE SARS-CoV-2 target nucleic acids are DETECTED. The SARS-CoV-2 RNA is generally detectable in upper and lower  respiratory specimens dur ing the acute phase of infection.  Positive  results are indicative of active infection with SARS-CoV-2.  Clinical  correlation with patient history and other diagnostic information is  necessary to determine patient infection status.  Positive results do  not rule out bacterial infection or co-infection with other viruses. If result is PRESUMPTIVE POSTIVE SARS-CoV-2 nucleic acids MAY BE PRESENT.   A presumptive positive result was obtained on the submitted specimen  and confirmed on repeat testing.  While 2019 novel coronavirus  (SARS-CoV-2) nucleic acids may be present in the submitted sample  additional confirmatory testing may be necessary for epidemiological  and / or clinical management purposes  to differentiate between  SARS-CoV-2 and other Sarbecovirus currently known to infect humans.  If clinically indicated additional testing with an alternate test  methodology [phone]) is advised. The SARS-CoV-2 RNA is generally  detectable in upper and lower respiratory sp ecimens during the acute  phase of infection. The expected result is Negative. Fact Sheet for Patients:  StrictlyIdeas.no Fact Sheet for Healthcare Providers: BankingDealers.co.za This test is not  yet approved or cleared by the [place] FDA and has been authorized for detection and/or diagnosis of SARS-CoV-2 by FDA under an Emergency Use Authorization (EUA).  This EUA will remain in effect (meaning this test can be used) for the duration of the COVID-19 declaration under Section 564(b)(1) of the Act, 21 U.S.C. section 360bbb-3(b)(1), unless the authorization is terminated or revoked sooner. Performed at [facility], [place] [address] [place], [place] [address]     Blood Alcohol level:  Lab Results  Component Value Date   ETH <10 08/21/2018   ETH <10 23/76/2831    Metabolic Disorder Labs:  Lab Results  Component Value Date   HGBA1C 6.2 (H) 03/06/2017   MPG 131.24 03/06/2017   MPG 108 05/17/2015   Lab Results  Component Value Date   PROLACTIN 64.2 (H) 03/06/2017   Lab Results  Component Value Date   CHOL 146 08/22/2018   TRIG 172 (H) 08/22/2018   HDL 49 08/22/2018   CHOLHDL 3.0 08/22/2018   VLDL 34 08/22/2018   LDLCALC 63 08/22/2018   LDLCALC 59 03/06/2017    Current Medications: No current facility-administered medications for this encounter.    PTA Medications: Medications Prior to Admission  Medication Sig Dispense Refill Last Dose  . acetaminophen (TYLENOL) 500 MG tablet Take 1 tablet (500 mg total) by mouth every 6 (six) hours as needed for mild pain. 30 tablet 0   . albuterol (PROVENTIL HFA;VENTOLIN HFA) 108 (90 Base) MCG/ACT inhaler Inhale 2 puffs into the lungs every 6 (six) hours as needed for wheezing or shortness of breath.     Marland Kitchen aspirin 81 MG tablet Take 81 mg by mouth daily.      . carbidopa-levodopa (SINEMET IR) 25-100 MG tablet Take 1 tablet by mouth 3 (three) times daily.     . cetirizine (ZYRTEC) 10 MG tablet Take 10 mg by mouth daily.     . cholecalciferol (VITAMIN D) 1000 units tablet Take 1,000 Units by mouth daily.     . cyclobenzaprine (FLEXERIL) 10  MG tablet Take 10 mg by mouth 2 (two) times daily.      .  diphenhydrAMINE (BENADRYL ALLERGY) 25 mg capsule Take 1 capsule (25 mg total) by mouth daily as needed. For side effects of tremors due to zyprexa 30 capsule 2   . docusate sodium (COLACE) 100 MG capsule Take 100 mg by mouth 2 (two) times daily.      . furosemide (LASIX) 20 MG tablet Take 20 mg by mouth daily.      Marland Kitchen glimepiride (AMARYL) 1 MG tablet Take 1 mg by mouth daily with breakfast.      . HYDROcodone-acetaminophen (NORCO/VICODIN) 5-325 MG tablet Take 0.5 tablets by mouth 2 (two) times daily. Every afternoon and at bedtime     . hydroxypropyl methylcellulose / hypromellose (ISOPTO TEARS / GONIOVISC) 2.5 % ophthalmic solution Place 1 drop into both eyes every 4 (four) hours as needed for dry eyes.     Marland Kitchen losartan (COZAAR) 25 MG tablet Take 25 mg by mouth daily.     . Melatonin 3 MG TABS Take 1 tablet (3 mg total) by mouth at bedtime. TAKE IT 90 MINUTES PRIOR TO BEDTIME FOR SLEEP 100 tablet 0   . metFORMIN (GLUCOPHAGE) 500 MG tablet Take 1 tablet (500 mg total) by mouth 2 (two) times daily with a meal. For diabetes management 10 tablet 0   . omeprazole (PRILOSEC) 40 MG capsule Take 40 mg by mouth daily as needed (heartburn).      . ondansetron (ZOFRAN) 4 MG tablet Take 4 mg by mouth every 8 (eight) hours as needed for nausea or vomiting.     . pravastatin (PRAVACHOL) 40 MG tablet Take 40 mg by mouth at bedtime.      . ranitidine (ZANTAC) 150 MG tablet Take 150 mg by mouth daily as needed for heartburn.      . risperiDONE (RISPERDAL) 0.5 MG tablet Take 1 tablet (0.5 mg total) by mouth at bedtime. 90 tablet 0   . rizatriptan (MAXALT) 10 MG tablet Take 10 mg by mouth 2 (two) times daily. May repeat in 2 hours if needed      . rOPINIRole (REQUIP) 0.25 MG tablet Take by mouth.     . senna (SENOKOT) 8.6 MG tablet Take 1-4 tablets (8.6-34.4 mg total) by mouth daily as needed for constipation. (Patient taking differently: Take 1 tablet by mouth at bedtime. )     . topiramate (TOPAMAX) 100 MG tablet Take  0.5 tablets (50 mg total) by mouth at bedtime. For mood control 30 tablet 1   . traMADol (ULTRAM) 50 MG tablet Take 2 tablets (100 mg total) by mouth every 8 (eight) hours as needed for moderate pain or severe pain. (Patient taking differently: Take 50-100 mg by mouth See admin instructions. 100 mg every morning, 50 mg every afternoon, and 50 mg at bedtime) 180 tablet 2   . verapamil (CALAN-SR) 120 MG CR tablet Take 1 tablet (120 mg total) by mouth at bedtime. For high blood pressure 90 tablet 1   . vitamin B-12 (CYANOCOBALAMIN) 1000 MCG tablet Take 1,000 mcg by mouth daily.       Musculoskeletal: Strength & Muscle Tone: within normal limits Gait & Station: normal Patient leans: N/A  Psychiatric Specialty Exam: Physical Exam obese but vital stable  ROS denies neurological issues denies cardiovascular issues denies GI GU issues  Blood pressure 127/83, pulse 94, temperature 98.5 F (36.9 C), temperature source Oral, resp. rate 18, height 5\' 5"  (1.651 m), weight 108.9  kg, SpO2 96 %.Body mass index is 39.94 kg/m.  General Appearance: Casual  Eye Contact:  None  Speech:  Elective mutism/intermittent mutism  Volume:  Decreased  Mood:  Dysphoric  Affect:  Blunt  Thought Process:  Disorganized and Descriptions of Associations: Circumstantial  Orientation:  Full (Time, Place, and Person) presumed person place situation will not answer questions of orientation  Thought Content:  Illogical, Delusions and Hallucinations: Auditory  Suicidal Thoughts:  No  Homicidal Thoughts:  No  Memory:  Immediate;   Poor  Judgement:  Poor  Insight:  Lacking  Psychomotor Activity:  Decreased  Concentration:  Concentration: Poor  Recall:  Poor  Fund of Knowledge:  Poor  Language:  Poor  Akathisia:  Negative  Handed:  Right  AIMS (if indicated):     Assets:  Physical Health Resilience  ADL's:  Intact  Cognition:  WNL  Sleep:  Number of Hours: 3.25    Treatment Plan Summary: Daily contact with patient  to assess and evaluate symptoms and progress in treatment and Medication management  Observation Level/Precautions:  15 minute checks  Laboratory:  UDS  Psychotherapy: Cognitive and reality based  Medications: Resume antipsychotics  Consultations: Not necessary  Discharge Concerns: Longer-term compliance  Estimated LOS: 7-10  Other: Exacerbation of underlying psychotic disorder   Physician Treatment Plan for Primary Diagnosis: Schizophrenic disorder Long Term Goal(s): Improvement in symptoms so as ready for discharge  Short Term Goals: Ability to verbalize feelings will improve, Ability to disclose and discuss suicidal ideas, Ability to demonstrate self-control will improve and Ability to identify and develop effective coping behaviors will improve  Physician Treatment Plan for Secondary Diagnosis: Active Problems:   Schizophrenia ([place])  Long Term Goal(s): Improvement in symptoms so as ready for discharge  Short Term Goals: Ability to disclose and discuss suicidal ideas, Ability to demonstrate self-control will improve, Ability to identify and develop effective coping behaviors will improve and Compliance with prescribed medications will improve  I certify that inpatient services furnished can reasonably be expected to improve the patient's condition.    Johnn Hai, MD 7/15/202011:01 AM

## 2018-08-23 NOTE — BHH Suicide Risk Assessment (Signed)
[facility] Admission Suicide Risk Assessment   Nursing information obtained from:  Patient Demographic factors:  Caucasian, Unemployed Current Mental Status:  NA Loss Factors:  NA Historical Factors:  NA Risk Reduction Factors:  NA  Total Time spent with patient: 45 minutes Principal Problem: Exacerbation of underlying psychotic disorder Diagnosis:  Active Problems:   Schizophrenia ([place])  Subjective Data: Elective mutism  Continued Clinical Symptoms:  Alcohol Use Disorder Identification Test Final Score (AUDIT): 0 The "Alcohol Use Disorders Identification Test", Guidelines for Use in Primary Care, Second Edition.  World Pharmacologist [facility]). Score between 0-7:  no or low risk or alcohol related problems. Score between 8-15:  moderate risk of alcohol related problems. Score between 16-19:  high risk of alcohol related problems. Score 20 or above:  warrants further diagnostic evaluation for alcohol dependence and treatment.   CLINICAL FACTORS:   Depression:   Delusional  Musculoskeletal: Strength & Muscle Tone: within normal limits Gait & Station: normal Patient leans: N/A  Psychiatric Specialty Exam: Physical Exam obese but vital stable  ROS denies neurological issues denies cardiovascular issues denies GI GU issues  Blood pressure 127/83, pulse 94, temperature 98.5 F (36.9 C), temperature source Oral, resp. rate 18, height 5\' 5"  (1.651 m), weight 108.9 kg, SpO2 96 %.Body mass index is 39.94 kg/m.  General Appearance: Casual  Eye Contact:  None  Speech:  Elective mutism/intermittent mutism  Volume:  Decreased  Mood:  Dysphoric  Affect:  Blunt  Thought Process:  Disorganized and Descriptions of Associations: Circumstantial  Orientation:  Full (Time, Place, and Person) presumed person place situation will not answer questions of orientation  Thought Content:  Illogical, Delusions and Hallucinations: Auditory  Suicidal Thoughts:  No  Homicidal Thoughts:  No  Memory:   Immediate;   Poor  Judgement:  Poor  Insight:  Lacking  Psychomotor Activity:  Decreased  Concentration:  Concentration: Poor  Recall:  Poor  Fund of Knowledge:  Poor  Language:  Poor  Akathisia:  Negative  Handed:  Right  AIMS (if indicated):     Assets:  Physical Health Resilience  ADL's:  Intact  Cognition:  WNL  Sleep:  Number of Hours: 3.25        COGNITIVE FEATURES THAT CONTRIBUTE TO RISK:  Loss of executive function    SUICIDE RISK:   Minimal: No identifiable suicidal ideation.  Patients presenting with no risk factors but with morbid ruminations; may be classified as minimal risk based on the severity of the depressive symptoms  PLAN OF CARE: see eval   I certify that inpatient services furnished can reasonably be expected to improve the patient's condition.   Johnn Hai, MD 08/23/2018, 11:13 AM

## 2018-08-23 NOTE — Progress Notes (Signed)
Did not attend group 
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
## 2018-08-23 NOTE — Progress Notes (Signed)
Patient ID: Heather Haley, female   DOB: 01-27-1961, 58 y.o.   MRN: [id number] D) Pt lying in bed with eyes closed. Pt did slightly open eyes and acknowledged Probation officer. No distress noted.breathing even and unlabored. Sitter at bedside. A) Level 1 obs supported for safety. reassurance provided. R) Safety maintained.

## 2018-08-23 NOTE — Progress Notes (Signed)
Recreation Therapy Notes  Date: 7.15.20 Time: 1010 Location:  500 Hall Dayroom  Group Topic: Wellness  Goal Area(s) Addresses:  Patient will define components of whole wellness. Patient will verbalize benefit of whole wellness.  Intervention: Exercise, Music  Activity: Exercise.  LRT played music and allowed each patient to lead the group in an exercise.  Patients were allowed to take breaks as needed.  Education: Wellness, Dentist.   Education Outcome: Acknowledges education/In group clarification offered/Needs additional education.   Clinical Observations/Feedback: Pt did not attend group.    Victorino Sparrow, LRT/CTRS         Victorino Sparrow A 08/23/2018 11:17 AM

## 2018-08-23 NOTE — BHH Counselor (Signed)
CSW asked patient for permission to talk to her daughter and patient agreed but refused to sign stating "I am tired of signing paperwork.  CSW asked about aftercare services for patient. Patient stated that CSW can get her scheduled back up with her psychiatrist at [facility] but stated, "I am tired of signing paperwork"

## 2018-08-23 NOTE — Progress Notes (Signed)
1:1 Progress Note D: Pt currently asleep in bed. Patient appropriate to situation. Pt in no current distress.  A: Sitter is currently at bedside. R: Pt remains safe on a 1:1 per MD orders.

## 2018-08-23 NOTE — Progress Notes (Signed)
Patient ID: Heather Haley, female   DOB: [birth year], 59 y.o.   MRN: [id number]  D) Pt sitting on side of the bed with sitter at bedside. Pt continues to refuse medications or coming out of her room. Pt stated "I'm ready to go home". Irritable but no distress noted. A) Level 1 obs supported for safety. Support and reassurance offered. Med ed reinforced. R) Pt safety maintained.

## 2018-08-24 MED ORDER — LORAZEPAM 0.5 MG PO TABS
0.5000 mg | ORAL_TABLET | Freq: Three times a day (TID) | ORAL | Status: DC
  Filled 2018-08-24: qty 1

## 2018-08-24 MED ORDER — MIRTAZAPINE 15 MG PO TBDP
15.0000 mg | ORAL_TABLET | Freq: Every day | ORAL | Status: DC
  Administered 2018-08-26 – 2018-08-27 (×2): 15 mg via ORAL
  Filled 2018-08-24 (×8): qty 1

## 2018-08-24 NOTE — Progress Notes (Signed)
1:1 Note: Patient maintained on constant supervision for safety.  Patient stayed in her room for most of this shift.  Refused 5 pm medication.  Routine safety checks maintained every 15 minutes.  Ambulating on the unit without using her walker.  Patient is safe on the unit with supervision.  No issues noted or reported. Patient is in no distress.

## 2018-08-24 NOTE — Progress Notes (Addendum)
Post 1:1 Note   Pt observe resting in bed with eyes awake. It appears Pt is falling asleep. No distress noted. Safety maintained.

## 2018-08-24 NOTE — Progress Notes (Signed)
1:1 Note: Patient maintained on constant supervision for safety.  Routine safety checks maintained every 15 minutes.  Refused afternoon medication.  MD made aware.  Patient in her room in bed with eyes closed.  Patient is safe on the unit with supervision.

## 2018-08-24 NOTE — Progress Notes (Signed)
[facility] MD Progress Note  08/24/2018 10:36 AM Heather Haley  MRN:  [id number] Subjective:    Patient seen-she insist on speaking in private which we do.  She is alert and she is oriented to person place situation she has a mild parkinsonian tremor from her meds hands bilaterally.  From a psychiatric standpoint she is very delusional-states that her father is God that she is the "golden Glenard Haring" and speaks in hyper religious terms.  She denies auditory or visual hallucinations but states "I talk to my grandfather" who of course is deceased but she seems to have him in her mind some type of spiritual figure so she is very delusional at this point in time.  When asked about safety measures she states she will never commit suicide and she understands what it is to contract so this is obviously positive information and she wants her one-to-one precautions to be discontinued Principal Problem: Exacerbation of underlying psychotic disorder Diagnosis: Active Problems:   Schizophrenia ([place])  Total Time spent with patient: 20 minutes  Past Psychiatric History: past dx schizophrenia  Past Medical History:  Past Medical History:  Diagnosis Date  . Anxiety   . Chronic kidney disease   . Chronic pain    previously saw Dr. Consuela Mimes in pain clinic, then saw pain specialist in [place]  . Depression   . Diabetes mellitus ([place])   . Frequency of urination   . GERD (gastroesophageal reflux disease)   . Headache(784.0)   . High cholesterol   . Hypertension   . IBS (irritable bowel syndrome)   . Left ankle instability   . Left knee DJD   . Lumbar Degenerative Disc Disease of  10/11/2014  . Neuromuscular disorder ([place])   . Osteoarthritis of hip (Right) 05/05/2015  . Other enthesopathy of ankle and tarsus 12/15/2009   Qualifier: Diagnosis of  By: Oneida Alar MD, KARL    . Parkinson's disease ([place])   . Peripheral sensory neuropathy (Bilateral) 11/19/2014  . Postoperative nausea and vomiting   . Schizophrenia  [facility])     Past Surgical History:  Procedure Laterality Date  . ABDOMINAL HYSTERECTOMY    . ANKLE SURGERY    . APPENDECTOMY    . COLONOSCOPY  2013  . COLONOSCOPY WITH PROPOFOL N/A 04/25/2017   Procedure: COLONOSCOPY WITH PROPOFOL;  Surgeon: Manya Silvas, MD;  Location: [facility] ENDOSCOPY;  Service: Endoscopy;  Laterality: N/A;  . ESOPHAGOGASTRODUODENOSCOPY (EGD) WITH PROPOFOL N/A 10/03/2014   Procedure: ESOPHAGOGASTRODUODENOSCOPY (EGD) WITH PROPOFOL;  Surgeon: Josefine Class, MD;  Location[facility] ENDOSCOPY;  Service: Endoscopy;  Laterality: N/A;  . ESOPHAGOGASTRODUODENOSCOPY (EGD) WITH PROPOFOL  04/25/2017   Procedure: ESOPHAGOGASTRODUODENOSCOPY (EGD) WITH PROPOFOL;  Surgeon: Manya Silvas, MD;  Location: [facility] ENDOSCOPY;  Service: Endoscopy;;  . KNEE ARTHROSCOPY  1997   left knee  . LEFT HEART CATH AND CORONARY ANGIOGRAPHY Left 11/10/2017   Procedure: LEFT HEART CATH AND CORONARY ANGIOGRAPHY;  Surgeon: Isaias Cowman, MD;  Location: [place] CV LAB;  Service: Cardiovascular;  Laterality: Left;  . LEFT HEART CATHETERIZATION WITH CORONARY ANGIOGRAM N/A 01/11/2013   Procedure: LEFT HEART CATHETERIZATION WITH CORONARY ANGIOGRAM;  Surgeon: Sinclair Grooms, MD;  Location: [facility] CATH LAB;  Service: Cardiovascular;  Laterality: N/A;  . TOTAL KNEE ARTHROPLASTY  08/30/2011   Procedure: TOTAL KNEE ARTHROPLASTY;  Surgeon: Lorn Junes, MD;  Location: [place];  Service: Orthopedics;  Laterality: Left;   Family History:  Family History  Problem Relation Age of Onset  . Cancer Mother   .  Hypertension Father   . Heart disease Father   . Alcohol abuse Father    Family Psychiatric  History: denies/MDD possible Social History:  Social History   Substance and Sexual Activity  Alcohol Use No  . Alcohol/week: 0.0 standard drinks     Social History   Substance and Sexual Activity  Drug Use No    Social History   Socioeconomic History  . Marital status: Divorced    Spouse name:  Not on file  . Number of children: 2  . Years of education: 97  . Highest education level: 11th grade  Occupational History    Employer: [employer]  . Financial resource strain: Somewhat hard  . Food insecurity    Worry: Never true    Inability: Never true  . Transportation needs    Medical: No    Non-medical: No  Tobacco Use  . Smoking status: Former Smoker    Packs/day: 2.00    Years: 15.00    Pack years: 30.00    Types: Cigarettes    Quit date: 02/09/1995    Years since quitting: 23.5  . Smokeless tobacco: Never Used  Substance and Sexual Activity  . Alcohol use: No    Alcohol/week: 0.0 standard drinks  . Drug use: No  . Sexual activity: Not Currently  Lifestyle  . Physical activity    Days per week: 0 days    Minutes per session: 0 min  . Stress: Rather much  Relationships  . Social connections    Talks on phone: More than three times a week    Gets together: Once a week    Attends religious service: Never    Active member of club or organization: No    Attends meetings of clubs or organizations: Never    Relationship status: Divorced  Other Topics Concern  . Not on file  Social History Narrative   Patient lives at home alone. Patient works at [employer].   Caffeine daily- 2   Right handed.   Education- 11 th grade   Additional Social History:                         Sleep: Good  Appetite:  Good  Current Medications: Current Facility-Administered Medications  Medication Dose Route Frequency Provider Last Rate Last Dose  . amantadine (SYMMETREL) capsule 100 mg  100 mg Oral BID Johnn Hai, MD      . LORazepam (ATIVAN) tablet 0.5 mg  0.5 mg Oral TID Johnn Hai, MD      . mirtazapine (REMERON SOL-TAB) disintegrating tablet 15 mg  15 mg Oral QHS Johnn Hai, MD      . mirtazapine (REMERON SOL-TAB) disintegrating tablet 15 mg  15 mg Oral QHS Johnn Hai, MD      . modafinil (PROVIGIL) tablet 100 mg  100 mg Oral Daily  Johnn Hai, MD      . risperiDONE (RISPERDAL) tablet 3 mg  3 mg Oral BID Johnn Hai, MD        Lab Results:  Results for orders placed or performed during the hospital encounter of 08/21/18 (from the past 48 hour(s))  TSH     Status: None   Collection Time: 08/22/18  2:35 PM  Result Value Ref Range   TSH 0.959 0.350 - 4.500 uIU/mL    Comment: Performed by a 3rd Generation assay with a functional sensitivity of <=0.01 uIU/mL. Performed at [facility], [address] [place], [place] [address]   Lipid panel     Status: Abnormal   Collection Time: 08/22/18  2:35 PM  Result Value Ref Range   Cholesterol 146 0 - 200 mg/dL   Triglycerides 172 (H) <150 mg/dL   HDL 49 >40 mg/dL   Total CHOL/HDL Ratio 3.0 RATIO   VLDL 34 0 - 40 mg/dL   LDL Cholesterol 63 0 - 99 mg/dL    Comment:        Total Cholesterol/HDL:CHD Risk Coronary Heart Disease Risk Table                     Men   Women  1/2 Average Risk   3.4   3.3  Average Risk       5.0   4.4  2 X Average Risk   9.6   7.1  3 X Average Risk  23.4   11.0        Use the calculated Patient Ratio above and the CHD Risk Table to determine the patient's CHD Risk.        ATP III CLASSIFICATION (LDL):  <100     mg/dL   Optimal  100-129  mg/dL   Near or Above                    Optimal  130-159  mg/dL   Borderline  160-189  mg/dL   High  >190     mg/dL   Very High Performed at [facility] [address], [place], [place] [address]   SARS Coronavirus 2 (CEPHEID - Performed in [facility] hospital lab), Hosp Order     Status: None   Collection Time: 08/22/18  2:41 PM   Specimen: Nasopharyngeal Swab  Result Value Ref Range   SARS Coronavirus 2 NEGATIVE NEGATIVE    Comment: (NOTE) If result is NEGATIVE SARS-CoV-2 target nucleic acids are NOT DETECTED. The SARS-CoV-2 RNA is generally detectable in upper and lower  respiratory specimens during the acute phase of infection. The lowest   concentration of SARS-CoV-2 viral copies this assay can detect is 250  copies / mL. A negative result does not preclude SARS-CoV-2 infection  and should not be used as the sole basis for treatment or other  patient management decisions.  A negative result may occur with  improper specimen collection / handling, submission of specimen other  than nasopharyngeal swab, presence of viral mutation(s) within the  areas targeted by this assay, and inadequate number of viral copies  (<250 copies / mL). A negative result must be combined with clinical  observations, patient history, and epidemiological information. If result is POSITIVE SARS-CoV-2 target nucleic acids are DETECTED. The SARS-CoV-2 RNA is generally detectable in upper and lower  respiratory specimens dur ing the acute phase of infection.  Positive  results are indicative of active infection with SARS-CoV-2.  Clinical  correlation with patient history and other diagnostic information is  necessary to determine patient infection status.  Positive results do  not rule out bacterial infection or co-infection with other viruses. If result is PRESUMPTIVE POSTIVE SARS-CoV-2 nucleic acids MAY BE PRESENT.   A presumptive positive result was obtained on the submitted specimen  and confirmed on repeat testing.  While 2019 novel coronavirus  (SARS-CoV-2) nucleic acids may be present in the submitted sample  additional confirmatory testing may be necessary for epidemiological  and / or clinical management purposes  to differentiate between  SARS-CoV-2  and other Sarbecovirus currently known to infect humans.  If clinically indicated additional testing with an alternate test  methodology [id number]) is advised. The SARS-CoV-2 RNA is generally  detectable in upper and lower respiratory sp ecimens during the acute  phase of infection. The expected result is Negative. Fact Sheet for Patients:  StrictlyIdeas.no Fact Sheet  for Healthcare Providers: BankingDealers.co.za This test is not yet approved or cleared by the [place] FDA and has been authorized for detection and/or diagnosis of SARS-CoV-2 by FDA under an Emergency Use Authorization (EUA).  This EUA will remain in effect (meaning this test can be used) for the duration of the COVID-19 declaration under Section 564(b)(1) of the Act, 21 U.S.C. section 360bbb-3(b)(1), unless the authorization is terminated or revoked sooner. Performed at [facility], [place] [address] [place], [place] [address]     Blood Alcohol level:  Lab Results  Component Value Date   ETH <10 08/21/2018   ETH <10 16/94/5038    Metabolic Disorder Labs: Lab Results  Component Value Date   HGBA1C 6.2 (H) 03/06/2017   MPG 131.24 03/06/2017   MPG 108 05/17/2015   Lab Results  Component Value Date   PROLACTIN 64.2 (H) 03/06/2017   Lab Results  Component Value Date   CHOL 146 08/22/2018   TRIG 172 (H) 08/22/2018   HDL 49 08/22/2018   CHOLHDL 3.0 08/22/2018   VLDL 34 08/22/2018   LDLCALC 63 08/22/2018   LDLCALC 59 03/06/2017    Musculoskeletal: Strength & Muscle Tone: within normal limits Gait & Station: normal Patient leans: N/A  Psychiatric Specialty Exam: Physical Exam  ROS  Blood pressure 127/83, pulse 94, temperature 98.5 F (36.9 C), temperature source Oral, resp. rate 18, height 5\' 5"  (1.651 m), weight 108.9 kg, SpO2 96 %.Body mass index is 39.94 kg/m.  General Appearance: Casual  Eye Contact:  Fair  Speech:  Slow  Volume:  Decreased  Mood:  Dysphoric  Affect:  Blunt  Thought Process:  Linear and Descriptions of Associations: Loose "my father is God- I have his toes and foot his eyes I am the Celso Amy his daughter"  Orientation:  Full (Time, Place, and Person) w/ help  Thought Content:  Illogical and Delusions  Suicidal Thoughts:  No  Homicidal Thoughts:  No  Memory:  Immediate;   Poor  Judgement:   Impaired  Insight:  Lacking  Psychomotor Activity:  Decreased  Concentration:  Concentration: Fair  Recall:  [employer] of Knowledge:  Fair  Language:  Good  Akathisia:  Negative  Handed:  Right  AIMS (if indicated):     Assets:  Resilience Social Support  ADL's:  Intact  Cognition:  WNL  Sleep:  Number of Hours: 4.25     Treatment Plan Summary: Daily contact with patient to assess and evaluate symptoms and progress in treatment and Medication management Continue reality-based therapy we will check with nursing staff about possibly discontinuing one-to-one precautions continue Risperdal reality-based therapy add sleep aid Nioka Thorington, MD 08/24/2018, 10:36 AM

## 2018-08-24 NOTE — Progress Notes (Signed)
Pt up at the nursing station demanding to leave" I'm in the children's ward let me go". Pt walking to the double doors and standing in front of them trying to elope when someone comes through. Pt went to the phone stating she was going to call 911, so the phones have been turned of for the evening. Pt continues to stand infront of the double doors not wanting to leave.

## 2018-08-24 NOTE — Progress Notes (Signed)
Nursing 1:1 note D:Pt observed sleeping in bed with eyes closed. RR even and unlabored. No distress noted. A: 1:1 observation continues for safety  R: pt remains safe  

## 2018-08-24 NOTE — BHH Suicide Risk Assessment (Signed)
[place] INPATIENT:  Family/Significant Other Suicide Prevention Education  Suicide Prevention Education:  Education Completed; Pt's daughter, Heather Haley, has been identified by the patient as the family member/significant other with whom the patient will be residing, and identified as the person(s) who will aid the patient in the event of a mental health crisis (suicidal ideations/suicide attempt).  With written consent from the patient, the family member/significant other has been provided the following suicide prevention education, prior to the and/or following the discharge of the patient.  The suicide prevention education provided includes the following:  Suicide risk factors  Suicide prevention and interventions  National Suicide Hotline telephone number  [facility] assessment telephone number  [facility] Emergency Assistance [place] and/or Residential Mobile Crisis Unit telephone number  Request made of family/significant other to:  Remove weapons (e.g., guns, rifles, knives), all items previously/currently identified as safety concern.    Remove drugs/medications (over-the-counter, prescriptions, illicit drugs), all items previously/currently identified as a safety concern.  The family member/significant other verbalizes understanding of the suicide prevention education information provided.  The family member/significant other agrees to remove the items of safety concern listed above.  CSW contacted pt's daughter, Heather Haley. Pt's daughter stated that she tried to call the patient yesterday and stated that she is still having delusions and hallucinations. Pt's daughter stated that this is the 3rd time that she has tried to get her mother on medications but this time is just "bad". Pt's daughter states that her mother (the patient) doesn't tell her anything about her medications or doctors because the patient believes that her daughter is  out to either hurt/kill her or talk to her doctors. Pt's daughter states that she just wants to help because she is the only person in town that can help her mother. Pt's daughter states that her mother will just refuse all of her medications and pt's daughter states, "we just got to do something to bring her out of this and talk".   Pt's daughter states that the patient drove herself to the police station on Monday at [place] telling the police that there was a golden angel and people in her backseat. The police called the pt's daughter and the pt's daughter tried to get the keys from the patient. Patient refused to get the keys and the patient's daughter had to get a locksmith to open the doors and get the car back to the pt's daughter's home. Pt's daughter states that she cannot force the patient to do anything but she just refuses everything. Pt's daughter stated that she does not want her released if she is not at least stable.   Trecia Rogers 08/24/2018, 11:24 AM

## 2018-08-24 NOTE — Progress Notes (Signed)
1:1 Note: Patient maintained on constant supervision for safety.  Patient ambulating on the unit with walker.  Refused morning medication after several attempts and encouragement.  Routine safety checks maintained every 15 minutes.  Patient is safe on the unit with supervision.

## 2018-08-24 NOTE — Progress Notes (Signed)
Post 1:1 Note   Pt observe standing by double doors. Pt refused to go to room. Pt stating "This is a children's ward and I'm in the wrong place". Pt dial 911 this evening so the phones had to be turn off. Pt refused scheduled meds provided with encouragement. Pt remains a HFR due to unsteady gait. Pt was made a UR due to risk of elopement. Pt remains safe.

## 2018-08-24 NOTE — Progress Notes (Signed)
Nursing 1:1 note D:Pt observed sitting up in bed. RR even and unlabored. No distress noted. A: 1:1 observation continues for safety  R: pt remains safe

## 2018-08-24 NOTE — Progress Notes (Signed)
Recreation Therapy Notes  INPATIENT RECREATION THERAPY ASSESSMENT  Patient Details Name: Heather Haley MRN: [id number] DOB: [birth year] Today's Date: 08/24/2018       Information Obtained From: Chart Review(Pt started answering questions but then refused saying she only wanted to talk to the doctor.)  Able to Participate in Assessment/Interview: Yes  Patient Presentation: Alert  Reason for Admission (Per Patient): Other (Comments)(Pt stated she came from [place].)  Patient Stressors: Family, Friends, Other (Comment)(Finances)  Coping Skills:   (None identified)  Leisure Interests (2+):  (Per chart, pt stated she doesn't do much.)  Frequency of Recreation/Participation:    Awareness of Community Resources:     [employer]:     Current Use:    If no, Barriers?:    Expressed Interest in [place] of Residence:  [place]  Patient Main Form of Transportation: Musician  Patient Strengths:  None identified  Patient Identified Areas of Improvement:  None identified  Patient Goal for Hospitalization:  Per chart, to discharge  Current SI (including self-harm):  (No answer given)  Current HI:  (No answer given)  Current AVH: (No answer given)  Staff Intervention Plan: Group Attendance, Collaborate with Interdisciplinary Treatment Team  Consent to Intern Participation: N/A    Victorino Sparrow, LRT/CTRS  Victorino Sparrow A 08/24/2018, 9:21 AM
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
## 2018-08-24 NOTE — Progress Notes (Signed)
[place] NOVEL CORONAVIRUS (COVID-19) DAILY CHECK-OFF SYMPTOMS - answer yes or no to each - every day NO YES  Have you had a fever in the past 24 hours?  . Fever (Temp > 37.80C / 100F) X   Have you had any of these symptoms in the past 24 hours? . New Cough .  Sore Throat  .  Shortness of Breath .  Difficulty Breathing .  Unexplained Body Aches   X   Have you had any one of these symptoms in the past 24 hours not related to allergies?   . Runny Nose .  Nasal Congestion .  Sneezing   X   If you have had runny nose, nasal congestion, sneezing in the past 24 hours, has it worsened?  X   EXPOSURES - check yes or no X   Have you traveled outside the state in the past 14 days?  X   Have you been in contact with someone with a confirmed diagnosis of COVID-19 or PUI in the past 14 days without wearing appropriate PPE?  X   Have you been living in the same home as a person with confirmed diagnosis of COVID-19 or a PUI (household contact)?    X   Have you been diagnosed with COVID-19?    X              What to do next: Answered NO to all: Answered YES to anything:   Proceed with unit schedule Follow the [facility] Inpatient Flowsheet.   

## 2018-08-25 MED ORDER — METFORMIN HCL 500 MG PO TABS
500.0000 mg | ORAL_TABLET | Freq: Two times a day (BID) | ORAL | Status: DC
  Administered 2018-08-27 – 2018-08-28 (×3): 500 mg via ORAL
  Filled 2018-08-25 (×12): qty 1

## 2018-08-25 MED ORDER — ROPINIROLE HCL 0.25 MG PO TABS
0.5000 mg | ORAL_TABLET | Freq: Two times a day (BID) | ORAL | Status: DC
  Administered 2018-08-25 – 2018-08-28 (×4): 0.5 mg via ORAL
  Filled 2018-08-25 (×4): qty 2
  Filled 2018-08-25 (×4): qty 1
  Filled 2018-08-25 (×5): qty 2
  Filled 2018-08-25: qty 1
  Filled 2018-08-25: qty 2

## 2018-08-25 MED ORDER — TRAMADOL HCL 50 MG PO TABS
100.0000 mg | ORAL_TABLET | Freq: Two times a day (BID) | ORAL | Status: DC
  Administered 2018-08-25 – 2018-08-26 (×3): 100 mg via ORAL
  Administered 2018-08-27 (×2): 50 mg via ORAL
  Administered 2018-08-27 – 2018-08-28 (×2): 100 mg via ORAL
  Filled 2018-08-25 (×2): qty 2
  Filled 2018-08-25: qty 1
  Filled 2018-08-25 (×4): qty 2

## 2018-08-25 MED ORDER — PANTOPRAZOLE SODIUM 40 MG PO TBEC
40.0000 mg | DELAYED_RELEASE_TABLET | Freq: Two times a day (BID) | ORAL | Status: DC
  Administered 2018-08-27 – 2018-08-28 (×2): 40 mg via ORAL
  Filled 2018-08-25 (×12): qty 1

## 2018-08-25 NOTE — Progress Notes (Signed)
Recreation Therapy Notes  Date: 7.17.20 Time: 1000 Location: 500 Hall Dayroom  Group Topic:  Goal Setting  Goal Area(s) Addresses:  Patient will be able to identify at least 3 life goals.  Patient will be able to identify benefit of investing in life goals.  Patient will be able to identify benefit of setting life goals.   Intervention: Worksheet, pencils  Activity: Goal Planning.  Patients were to set goals they wish to accomplish in a week, month, year and 5 years.  Pt were to also identify any obstacles they could face, what they need to accomplish their goals and what they can start doing now to work towards their goals.  Education:  Discharge Planning, Radiographer, therapeutic, Leisure Education   Education Outcome: Acknowledges Education/In Group Clarification Provided/Needs Additional Education  Clinical Observations:  Pt did not attend group.     Victorino Sparrow, LRT/CTRS        Ria Comment, Tersea Aulds A 08/25/2018 11:10 AM

## 2018-08-25 NOTE — Progress Notes (Signed)
[facility] MD Progress Note  08/25/2018 7:52 AM Heather Haley  MRN:  [id number] Subjective:   Patient is seen in detail she is a 58 year old who gives a better history now she is no longer mute or playing possible we try to interview her though she has a known history of schizophrenia she states that all of her psychosis began when she was diagnosed with parkinsonism - this does, treated the record however At any rate she is now alert oriented to person place time situation and day is very focused on going home.  Wants to make sure that her home meds are correct and she reviews the home medication list in the computer with me and tells me it is incorrect that all she really needs is her Metformin, reflux medicine, Parkinson medications, and something for pain. When discussing the previously expressed delusional material that she is a Chiropractor" so forth she states "that will always be with me" but she is certainly less focused on it and she denies hearing the voice of God or other auditory hallucinations today.  Her tremor is less, less parkinsonism we have added amantadine to her normal Parkinson regimen She tells me the Sinemet listed in her med reconciliation form is an error Principal Problem: Exacerbation of psychotic disorder Diagnosis: Active Problems:   Schizophrenia ([place])  Total Time spent with patient: 20 minutes  Past Psychiatric History: Schiz  Past Medical History:  Past Medical History:  Diagnosis Date  . Anxiety   . Chronic kidney disease   . Chronic pain    previously saw Dr. Consuela Mimes in pain clinic, then saw pain specialist in [place]  . Depression   . Diabetes mellitus ([place])   . Frequency of urination   . GERD (gastroesophageal reflux disease)   . Headache(784.0)   . High cholesterol   . Hypertension   . IBS (irritable bowel syndrome)   . Left ankle instability   . Left knee DJD   . Lumbar Degenerative Disc Disease of  10/11/2014  . Neuromuscular disorder ([place])    . Osteoarthritis of hip (Right) 05/05/2015  . Other enthesopathy of ankle and tarsus 12/15/2009   Qualifier: Diagnosis of  By: Oneida Alar MD, KARL    . Parkinson's disease ([place])   . Peripheral sensory neuropathy (Bilateral) 11/19/2014  . Postoperative nausea and vomiting   . Schizophrenia [facility])     Past Surgical History:  Procedure Laterality Date  . ABDOMINAL HYSTERECTOMY    . ANKLE SURGERY    . APPENDECTOMY    . COLONOSCOPY  2013  . COLONOSCOPY WITH PROPOFOL N/A 04/25/2017   Procedure: COLONOSCOPY WITH PROPOFOL;  Surgeon: Manya Silvas, MD;  Location: [facility] ENDOSCOPY;  Service: Endoscopy;  Laterality: N/A;  . ESOPHAGOGASTRODUODENOSCOPY (EGD) WITH PROPOFOL N/A 10/03/2014   Procedure: ESOPHAGOGASTRODUODENOSCOPY (EGD) WITH PROPOFOL;  Surgeon: Josefine Class, MD;  Location: [facility] ENDOSCOPY;  Service: Endoscopy;  Laterality: N/A;  . ESOPHAGOGASTRODUODENOSCOPY (EGD) WITH PROPOFOL  04/25/2017   Procedure: ESOPHAGOGASTRODUODENOSCOPY (EGD) WITH PROPOFOL;  Surgeon: Manya Silvas, MD;  Location: [facility] ENDOSCOPY;  Service: Endoscopy;;  . KNEE ARTHROSCOPY  1997   left knee  . LEFT HEART CATH AND CORONARY ANGIOGRAPHY Left 11/10/2017   Procedure: LEFT HEART CATH AND CORONARY ANGIOGRAPHY;  Surgeon: Isaias Cowman, MD;  Location: [place] [facility] CV LAB;  Service: Cardiovascular;  Laterality: Left;  . LEFT HEART CATHETERIZATION WITH CORONARY ANGIOGRAM N/A 01/11/2013   Procedure: LEFT HEART CATHETERIZATION WITH CORONARY ANGIOGRAM;  Surgeon: Sinclair Grooms, MD;  Location:  [place] CATH LAB;  Service: Cardiovascular;  Laterality: N/A;  . TOTAL KNEE ARTHROPLASTY  08/30/2011   Procedure: TOTAL KNEE ARTHROPLASTY;  Surgeon: Lorn Junes, MD;  Location: [place];  Service: Orthopedics;  Laterality: Left;   Family History:  Family History  Problem Relation Age of Onset  . Cancer Mother   . Hypertension Father   . Heart disease Father   . Alcohol abuse Father    Family Psychiatric  History: denies   Social History:  Social History   Substance and Sexual Activity  Alcohol Use No  . Alcohol/week: 0.0 standard drinks     Social History   Substance and Sexual Activity  Drug Use No    Social History   Socioeconomic History  . Marital status: Divorced    Spouse name: Not on file  . Number of children: 2  . Years of education: 59  . Highest education level: 11th grade  Occupational History    Employer: [employer] Needs  . Financial resource strain: Somewhat hard  . Food insecurity    Worry: Never true    Inability: Never true  . Transportation needs    Medical: No    Non-medical: No  Tobacco Use  . Smoking status: Former Smoker    Packs/day: 2.00    Years: 15.00    Pack years: 30.00    Types: Cigarettes    Quit date: 02/09/1995    Years since quitting: 23.5  . Smokeless tobacco: Never Used  Substance and Sexual Activity  . Alcohol use: No    Alcohol/week: 0.0 standard drinks  . Drug use: No  . Sexual activity: Not Currently  Lifestyle  . Physical activity    Days per week: 0 days    Minutes per session: 0 min  . Stress: Rather much  Relationships  . Social connections    Talks on phone: More than three times a week    Gets together: Once a week    Attends religious service: Never    Active member of club or organization: No    Attends meetings of clubs or organizations: Never    Relationship status: Divorced  Other Topics Concern  . Not on file  Social History Narrative   Patient lives at home alone. Patient works at [employer].   Caffeine daily- 2   Right handed.   Education- 11 th grade   Additional Social History:                         Sleep: Good  Appetite:  Good  Current Medications: Current Facility-Administered Medications  Medication Dose Route Frequency Provider Last Rate Last Dose  . amantadine (SYMMETREL) capsule 100 mg  100 mg Oral BID Johnn Hai, MD      . LORazepam (ATIVAN) tablet 0.5 mg  0.5 mg Oral  TID Johnn Hai, MD      . metFORMIN (GLUCOPHAGE) tablet 500 mg  500 mg Oral BID WC Johnn Hai, MD      . mirtazapine (REMERON SOL-TAB) disintegrating tablet 15 mg  15 mg Oral QHS Johnn Hai, MD      . modafinil (PROVIGIL) tablet 100 mg  100 mg Oral Daily Johnn Hai, MD      . pantoprazole (PROTONIX) EC tablet 40 mg  40 mg Oral BID Johnn Hai, MD      . risperiDONE (RISPERDAL) tablet 3 mg  3 mg Oral BID Johnn Hai, MD      .  rOPINIRole (REQUIP) tablet 0.5 mg  0.5 mg Oral BID Johnn Hai, MD      . traMADol Veatrice Bourbon) tablet 100 mg  100 mg Oral Q12H Johnn Hai, MD        Lab Results: No results found for this or any previous visit (from the past 48 hour(s)).  Blood Alcohol level:  Lab Results  Component Value Date   ETH <10 08/21/2018   ETH <10 47/82/9562    Metabolic Disorder Labs: Lab Results  Component Value Date   HGBA1C 6.2 (H) 03/06/2017   MPG 131.24 03/06/2017   MPG 108 05/17/2015   Lab Results  Component Value Date   PROLACTIN 64.2 (H) 03/06/2017   Lab Results  Component Value Date   CHOL 146 08/22/2018   TRIG 172 (H) 08/22/2018   HDL 49 08/22/2018   CHOLHDL 3.0 08/22/2018   VLDL 34 08/22/2018   LDLCALC 63 08/22/2018   LDLCALC 59 03/06/2017    Physical Findings: Less tremors Musculoskeletal: Strength & Muscle Tone: within normal limits Gait & Station: normal Patient leans: N/A  Psychiatric Specialty Exam: Physical Exam  ROS  Blood pressure 127/83, pulse 94, temperature 98.5 F (36.9 C), temperature source Oral, resp. rate 18, height 5\' 5"  (1.651 m), weight 108.9 kg, SpO2 96 %.Body mass index is 39.94 kg/m.  General Appearance: Casual  Eye Contact:  Fair  Speech:  Slow  Volume:  Decreased  Mood:  Anxious and Dysphoric  Affect:  Blunt and Congruent  Thought Process:  Goal Directed and Descriptions of Associations: Tangential  Orientation:  Full (Time, Place, and Person)  Thought Content:  Delusions/less intense  Suicidal Thoughts:  No   Homicidal Thoughts:  No  Memory:  Immediate;   Good  Judgement:  Good  Insight:  Good  Psychomotor Activity:  Normal and Decreased  Concentration:  Concentration: Fair  Recall:  [employer] of Knowledge:  Fair  Language:  Fair  Akathisia:  Negative  Handed:  Right  AIMS (if indicated):     Assets:  Communication Skills Desire for Improvement Resilience Social Support  ADL's:  Intact  Cognition:  WNL  Sleep:  Number of Hours: 5     Treatment Plan Summary: Daily contact with patient to assess and evaluate symptoms and progress in treatment and Medication management continue reality-based therapy continue to monitor no changes in precautions monitor through the weekend.  Restart home meds she is requesting  Johnn Hai, MD 08/25/2018, 7:52 AM

## 2018-08-25 NOTE — Progress Notes (Signed)
Post 1:1 Note   Pt observe resting in bed with eyes closed. Respirations even and unlabored. No distress noted. Safety maintained.

## 2018-08-25 NOTE — Plan of Care (Signed)
Progress note  D: pt found in her room; compliant with part of her medication regimen. Pt states she only wants the medications she takes at home. Pt refused the rest. Pt is semi compliant with her walker, using it only sometimes. Pt has been in her room most of the day, sleeping or isolating. Pt denies any physical complaints or pain. Pt is angry and cautious in her demeanor. Pt denies si/hi/ah/vh and verbally agrees to approach staff if these become apparent or before harming himself/others while at [place]: Pt provided support and encouragement. Pt given medication per protocol and standing orders. Q40m safety checks implemented and continued.  R: Pt safe on the unit. Will continue to monitor.  Pt progressing in the following metrics  Problem: Activity: Goal: Will verbalize the importance of balancing activity with adequate rest periods Outcome: Progressing   Problem: Education: Goal: Will be free of psychotic symptoms Outcome: Progressing Goal: Knowledge of the prescribed therapeutic regimen will improve Outcome: Progressing   Problem: Coping: Goal: Coping ability will improve Outcome: Progressing

## 2018-08-25 NOTE — Progress Notes (Signed)
Post 1:1 Note   Pt observe resting in bed with eyes closed. Respirations even and unlabored. No distress noted. Safety maintained

## 2018-08-26 LAB — GLUCOSE, CAPILLARY: Glucose-Capillary: 157 mg/dL — ABNORMAL HIGH (ref 70–99)
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
## 2018-08-26 NOTE — Progress Notes (Signed)
[facility] MD Progress Note  08/26/2018 11:06 AM JOURNI MOFFA  MRN:  [id number] Subjective: Patient is a 58 year old female with a past psychiatric history significant for schizophrenia.  She was admitted on 08/21/2018 secondary to noncompliance with medication and not sleeping or taking care of herself.  Objective: Patient is a 58 year old female who is seen in follow-up.  She has had an episode of selective mutism as well as selective catatonia earlier in the hospitalization.  She is doing much better.  She denied auditory or visual hallucinations today.  No gross evidence of paranoia.  She did state that she had heard some voices yesterday, but they are much less than they had been on admission.  She denied any side effects to her current medications.  She continues on lorazepam, mirtazapine, Provigil, Risperdal and Requip.  She is a diabetic and is on metformin.  Review of her laboratories revealed from 7/13 a mildly elevated creatinine at 1.06 and a drug screen positive for opiates.  She was on tramadol on admission.  Her vital signs are stable.  She is afebrile.  She slept 5.25 hours last night.  Blood sugar was not checked.  Principal Problem: <principal problem not specified> Diagnosis: Active Problems:   Schizophrenia ([place])  Total Time spent with patient: 20 minutes  Past Psychiatric History: See admission H&P  Past Medical History:  Past Medical History:  Diagnosis Date  . Anxiety   . Chronic kidney disease   . Chronic pain    previously saw Dr. Consuela Mimes in pain clinic, then saw pain specialist in [place]  . Depression   . Diabetes mellitus ([place])   . Frequency of urination   . GERD (gastroesophageal reflux disease)   . Headache(784.0)   . High cholesterol   . Hypertension   . IBS (irritable bowel syndrome)   . Left ankle instability   . Left knee DJD   . Lumbar Degenerative Disc Disease of  10/11/2014  . Neuromuscular disorder ([place])   . Osteoarthritis of hip (Right)  05/05/2015  . Other enthesopathy of ankle and tarsus 12/15/2009   Qualifier: Diagnosis of  By: Oneida Alar MD, KARL    . Parkinson's disease ([place])   . Peripheral sensory neuropathy (Bilateral) 11/19/2014  . Postoperative nausea and vomiting   . Schizophrenia [facility])     Past Surgical History:  Procedure Laterality Date  . ABDOMINAL HYSTERECTOMY    . ANKLE SURGERY    . APPENDECTOMY    . COLONOSCOPY  2013  . COLONOSCOPY WITH PROPOFOL N/A 04/25/2017   Procedure: COLONOSCOPY WITH PROPOFOL;  Surgeon: Manya Silvas, MD;  Location[facility] ENDOSCOPY;  Service: Endoscopy;  Laterality: N/A;  . ESOPHAGOGASTRODUODENOSCOPY (EGD) WITH PROPOFOL N/A 10/03/2014   Procedure: ESOPHAGOGASTRODUODENOSCOPY (EGD) WITH PROPOFOL;  Surgeon: Josefine Class, MD;  Location[facility] ENDOSCOPY;  Service: Endoscopy;  Laterality: N/A;  . ESOPHAGOGASTRODUODENOSCOPY (EGD) WITH PROPOFOL  04/25/2017   Procedure: ESOPHAGOGASTRODUODENOSCOPY (EGD) WITH PROPOFOL;  Surgeon: Manya Silvas, MD;  Location: [facility] ENDOSCOPY;  Service: Endoscopy;;  . KNEE ARTHROSCOPY  1997   left knee  . LEFT HEART CATH AND CORONARY ANGIOGRAPHY Left 11/10/2017   Procedure: LEFT HEART CATH AND CORONARY ANGIOGRAPHY;  Surgeon: Isaias Cowman, MD;  Location: [place] CV LAB;  Service: Cardiovascular;  Laterality: Left;  . LEFT HEART CATHETERIZATION WITH CORONARY ANGIOGRAM N/A 01/11/2013   Procedure: LEFT HEART CATHETERIZATION WITH CORONARY ANGIOGRAM;  Surgeon: Sinclair Grooms, MD;  Location: [facility] CATH LAB;  Service: Cardiovascular;  Laterality: N/A;  . TOTAL  KNEE ARTHROPLASTY  08/30/2011   Procedure: TOTAL KNEE ARTHROPLASTY;  Surgeon: Lorn Junes, MD;  Location: [place];  Service: Orthopedics;  Laterality: Left;   Family History:  Family History  Problem Relation Age of Onset  . Cancer Mother   . Hypertension Father   . Heart disease Father   . Alcohol abuse Father    Family Psychiatric  History: See admission H&P Social History:  Social  History   Substance and Sexual Activity  Alcohol Use No  . Alcohol/week: 0.0 standard drinks     Social History   Substance and Sexual Activity  Drug Use No    Social History   Socioeconomic History  . Marital status: Divorced    Spouse name: Not on file  . Number of children: 2  . Years of education: 49  . Highest education level: 11th grade  Occupational History    Employer: [employer]  . Financial resource strain: Somewhat hard  . Food insecurity    Worry: Never true    Inability: Never true  . Transportation needs    Medical: No    Non-medical: No  Tobacco Use  . Smoking status: Former Smoker    Packs/day: 2.00    Years: 15.00    Pack years: 30.00    Types: Cigarettes    Quit date: 02/09/1995    Years since quitting: 23.5  . Smokeless tobacco: Never Used  Substance and Sexual Activity  . Alcohol use: No    Alcohol/week: 0.0 standard drinks  . Drug use: No  . Sexual activity: Not Currently  Lifestyle  . Physical activity    Days per week: 0 days    Minutes per session: 0 min  . Stress: Rather much  Relationships  . Social connections    Talks on phone: More than three times a week    Gets together: Once a week    Attends religious service: Never    Active member of club or organization: No    Attends meetings of clubs or organizations: Never    Relationship status: Divorced  Other Topics Concern  . Not on file  Social History Narrative   Patient lives at home alone. Patient works at [employer].   Caffeine daily- 2   Right handed.   Education- 11 th grade   Additional Social History:                         Sleep: Good  Appetite:  Fair  Current Medications: Current Facility-Administered Medications  Medication Dose Route Frequency Provider Last Rate Last Dose  . amantadine (SYMMETREL) capsule 100 mg  100 mg Oral BID Johnn Hai, MD      . LORazepam (ATIVAN) tablet 0.5 mg  0.5 mg Oral TID Johnn Hai, MD       . metFORMIN (GLUCOPHAGE) tablet 500 mg  500 mg Oral BID WC Johnn Hai, MD      . mirtazapine (REMERON SOL-TAB) disintegrating tablet 15 mg  15 mg Oral QHS Johnn Hai, MD      . modafinil (PROVIGIL) tablet 100 mg  100 mg Oral Daily Johnn Hai, MD      . pantoprazole (PROTONIX) EC tablet 40 mg  40 mg Oral BID Johnn Hai, MD      . risperiDONE (RISPERDAL) tablet 3 mg  3 mg Oral BID Johnn Hai, MD      . rOPINIRole (REQUIP) tablet 0.5 mg  0.5 mg  Oral BID Johnn Hai, MD   0.5 mg at 08/25/18 1137  . traMADol (ULTRAM) tablet 100 mg  100 mg Oral Q12H Johnn Hai, MD   100 mg at 08/25/18 2051    Lab Results: No results found for this or any previous visit (from the past 69 hour(s)).  Blood Alcohol level:  Lab Results  Component Value Date   ETH <10 08/21/2018   ETH <10 16/11/9602    Metabolic Disorder Labs: Lab Results  Component Value Date   HGBA1C 6.2 (H) 03/06/2017   MPG 131.24 03/06/2017   MPG 108 05/17/2015   Lab Results  Component Value Date   PROLACTIN 64.2 (H) 03/06/2017   Lab Results  Component Value Date   CHOL 146 08/22/2018   TRIG 172 (H) 08/22/2018   HDL 49 08/22/2018   CHOLHDL 3.0 08/22/2018   VLDL 34 08/22/2018   LDLCALC 63 08/22/2018   LDLCALC 59 03/06/2017    Physical Findings: AIMS: Facial and Oral Movements Muscles of Facial Expression: None, normal Lips and Perioral Area: None, normal Jaw: None, normal Tongue: None, normal,Extremity Movements Upper (arms, wrists, hands, fingers): None, normal Lower (legs, knees, ankles, toes): None, normal, Trunk Movements Neck, shoulders, hips: None, normal, Overall Severity Severity of abnormal movements (highest score from questions above): None, normal Incapacitation due to abnormal movements: None, normal Patient's awareness of abnormal movements (rate only patient's report): No Awareness, Dental Status Current problems with teeth and/or dentures?: No Does patient usually wear dentures?: No  CIWA:     COWS:     Musculoskeletal: Strength & Muscle Tone: within normal limits Gait & Station: normal Patient leans: N/A  Psychiatric Specialty Exam: Physical Exam  Nursing note and vitals reviewed. Constitutional: She is oriented to person, place, and time. She appears well-developed and well-nourished.  HENT:  Head: Normocephalic and atraumatic.  Respiratory: Effort normal.  Neurological: She is alert and oriented to person, place, and time.    ROS  Blood pressure 127/83, pulse 94, temperature 98.5 F (36.9 C), temperature source Oral, resp. rate 18, height 5\' 5"  (1.651 m), weight 108.9 kg, SpO2 96 %.Body mass index is 39.94 kg/m.  General Appearance: Casual  Eye Contact:  Fair  Speech:  Normal Rate  Volume:  Normal  Mood:  Anxious  Affect:  Congruent  Thought Process:  Coherent and Descriptions of Associations: Circumstantial  Orientation:  Full (Time, Place, and Person)  Thought Content:  Hallucinations: Auditory  Suicidal Thoughts:  No  Homicidal Thoughts:  No  Memory:  Immediate;   Fair Recent;   Fair Remote;   Fair  Judgement:  Intact  Insight:  Lacking  Psychomotor Activity:  Normal  Concentration:  Concentration: Fair and Attention Span: Fair  Recall:  [employer] of Knowledge:  Fair  Language:  Good  Akathisia:  Negative  Handed:  Right  AIMS (if indicated):     Assets:  Desire for Improvement Resilience  ADL's:  Intact  Cognition:  WNL  Sleep:  Number of Hours: 5.25     Treatment Plan Summary: Daily contact with patient to assess and evaluate symptoms and progress in treatment, Medication management and Plan : Patient is seen and examined.  Patient is a 58 year old female with the above-stated past psychiatric history who is seen in follow-up.   Diagnosis: #1 schizophrenia, #2 Parkinson's, #3 diabetes mellitus type 2, #4 chronic pain, #5 chronic kidney disease, #6 GERD, #7 hypertension, #8 degenerative disc disease as well as lower extremity arthritis, #9  sensory bilateral  neuropathy  Patient is seen and examined.  Patient is a 58 year old female with the above-stated past psychiatric history who is seen in follow-up.  She seems to be slowly improving.  No change in her psychiatric medications currently.  We will check her blood sugar in the morning.  It seems as though most of her delusional thinking is near its baseline.  She has had issues with regard to "the [employer]".  She did admit that she continues to have a low degree of auditory hallucinations that she believes is less though than usual.  With regard to her Parkinson's tremor it appears to be improved as well. 1.  Continue lorazepam 0.5 mg p.o. 3 times daily for anxiety. 2.  Continue Metformin 500 mg p.o. twice daily with food for diabetes mellitus type 2 3.  Check blood sugar in a.m. 4.  Continue mirtazapine 15 mg p.o. nightly for depression, anxiety as well as insomnia. 5.  Continue Provigil 100 mg p.o. daily for alertness. 6.  Continue Protonix 40 mg p.o. twice daily for reflux disease. 7.  Continue Risperdal 3 mg p.o. twice daily for psychosis. 8.  Continue Requip for Parkinson's symptoms as well as side effects of medications. 9.  Continue tramadol 100 mg p.o. every 12 hours for chronic pain. 10. disposition planning-in progress.  Sharma Covert, MD 08/26/2018, 11:06 AM

## 2018-08-26 NOTE — Progress Notes (Signed)
   08/26/18 0819  COVID-19 Daily Checkoff  If you have had runny nose, nasal congestion, sneezing in the past 24 hours, has it worsened? No  COVID-19 EXPOSURE  Have you traveled outside the state in the past 14 days? No  Have you been in contact with someone with a confirmed diagnosis of COVID-19 or PUI in the past 14 days without wearing appropriate PPE? No  Have you been living in the same home as a person with confirmed diagnosis of COVID-19 or a PUI (household contact)? No  Have you been diagnosed with COVID-19? No

## 2018-08-26 NOTE — Progress Notes (Signed)
DAR NOTE: Patient presents with anxious affect and depressed mood.  Denies pain, auditory and visual hallucinations.  Refused her evening medications except for tramadol.  Maintained on routine safety checks.  Medications given as prescribed.  Support and encouragement offered as needed.  Will continue to monitor.

## 2018-08-26 NOTE — Progress Notes (Signed)
Did not attend group 

## 2018-08-26 NOTE — [facility] Group Notes (Signed)
  [facility] LCSW Group Therapy Note  Date/Time:  08/26/2018 11:15AM-12:00PM  Type of Therapy and Topic:  Group Therapy:  Feelings About Hospitalization  Participation Level:   Group was deferred for outdoor recreation due to the weather forecast  Description of Group This process group involved patients discussing their feelings related to being hospitalized, as well as the benefits they see to being in the hospital.  These feelings and benefits were itemized.  The group then brainstormed specific ways in which they could seek those same benefits when they discharge and return home.  Therapeutic Goals 1. Patient will identify and describe positive and negative feelings related to hospitalization 2. Patient will verbalize benefits of hospitalization to themselves personally 3. Patients will brainstorm together ways they can obtain similar benefits in the outpatient setting, identify barriers to wellness and possible solutions  Summary of Patient Progress:  N/A  Therapeutic Modalities Cognitive Behavioral Therapy Motivational Interviewing    Selmer Dominion, LCSW 08/26/2018, 8:49 AM

## 2018-08-26 NOTE — Progress Notes (Signed)
D: Patient presents labile, aggressive verbally. She refuses all medications and states that "you need to check with my doctor..." She believes we are trying to give her the incorrect meds. She appears paranoid and has delusions of grandeur and persecution. Patient denies SI/HI/AVH. She is observed lying in bed. She is resistant to care. A: Patient checked q15 min, and checks reviewed. Educated patient on importance of attending group therapy sessions and educated on several coping skills. Encouarged participation in milieu through recreation therapy and attending meals with peers. Support and encouragement provided. Fluids offered. R: Patient contracts for safety on the unit.

## 2018-08-27 LAB — GLUCOSE, CAPILLARY: Glucose-Capillary: 146 mg/dL — ABNORMAL HIGH (ref 70–99)

## 2018-08-27 MED ORDER — LORAZEPAM 0.5 MG PO TABS: 0.5000 mg | ORAL_TABLET | Freq: Three times a day (TID) | ORAL | Status: DC | PRN

## 2018-08-27 NOTE — [facility] Group Notes (Signed)
[place] LCSW Group Therapy Note  Date/Time:  08/27/2018  11:00AM-12:00PM  Type of Therapy and Topic:  Group Therapy:  Music and Mood  Participation Level:  Did Not Attend - Group was deferred for outdoor recreation time due to the weather.  Description of Group: In this process group, members listened to a variety of genres of music and identified that different types of music evoke different responses.  Patients were encouraged to identify music that was soothing for them and music that was energizing for them.  Patients discussed how this knowledge can help with wellness and recovery in various ways including managing depression and anxiety as well as encouraging healthy sleep habits.    Therapeutic Goals: 1. Patients will explore the impact of different varieties of music on mood 2. Patients will verbalize the thoughts they have when listening to different types of music 3. Patients will identify music that is soothing to them as well as music that is energizing to them 4. Patients will discuss how to use this knowledge to assist in maintaining wellness and recovery 5. Patients will explore the use of music as a coping skill  Summary of Patient Progress:  N/A  Therapeutic Modalities: Solution Focused Brief Therapy Activity   Selmer Dominion, LCSW

## 2018-08-27 NOTE — Progress Notes (Signed)
   08/27/18 0825  COVID-19 Daily Checkoff  Have you had a fever (temp > 37.80C/100F)  in the past 24 hours?  No  If you have had runny nose, nasal congestion, sneezing in the past 24 hours, has it worsened? No  COVID-19 EXPOSURE  Have you traveled outside the state in the past 14 days? No  Have you been in contact with someone with a confirmed diagnosis of COVID-19 or PUI in the past 14 days without wearing appropriate PPE? No  Have you been living in the same home as a person with confirmed diagnosis of COVID-19 or a PUI (household contact)? No  Have you been diagnosed with COVID-19? No

## 2018-08-27 NOTE — Progress Notes (Signed)
Adult Psychoeducational Group Note  Date:  08/27/2018 Time:  11:23 PM  Group Topic/Focus:  Wrap-Up Group:   The focus of this group is to help patients review their daily goal of treatment and discuss progress on daily workbooks.  Participation Level:  Minimal  Participation Quality:  Appropriate  Affect:  Appropriate  Cognitive:  Appropriate  Insight: Appropriate  Engagement in Group:  Engaged  Modes of Intervention:  Discussion  Additional Comments: Pt stated her goal for today was to stay wake, become more active in groups, and talk with her doctor about her pain issues in her back, knees, and feet. Pt stated for the most part, she feels she accomplished her goals for today. Pt rated her day a 5 out of 10. Pt stated her appetite has improve today. Pt stated she talk with her nurse and the plans is for her to take the same medication as last night, so she will be able to get another good nights rest.  Candy Sledge 08/27/2018, 11:23 PM

## 2018-08-27 NOTE — Progress Notes (Signed)
[facility] MD Progress Note  08/27/2018 9:02 AM Heather Haley  MRN:  [id number] Subjective:  Patient is a 58 year old female with a past psychiatric history significant for schizophrenia.  She was admitted on 08/21/2018 secondary to noncompliance with medication and not sleeping or taking care of herself.  Objective: Patient is a 58 year old female who is seen in follow-up.  She is essentially unchanged from yesterday.  She continues to complain of pain.  The last vital signs in the chart are from 7/14.  She slept 5 hours last night.  She apparently refused medication yesterday.  She continues on Risperdal 3 mg p.o. twice daily for her antipsychotic medication.  She continues to complain of oversedation and back pain.  She stated that she had refused the Risperdal yesterday.  She stated she wanted to know why she was taking that.  We discussed the fact that was for the auditory hallucinations in her thinking.  She stated she had an outside psychiatrist who would decide those medicines, and I reiterated to her that these were the medication she was getting here, and that if she decided to switch medicines it would be up to her outpatient psychiatrist afterwards.  Her pain issues are with her ankles and back.  We discussed trying to get up and move to assist with that, but she stated she is unable to do that.  She denied any auditory hallucinations today.  She denied any suicidal or homicidal ideation.  Her blood sugar this morning is 146.  Principal Problem: <principal problem not specified> Diagnosis: Active Problems:   Schizophrenia ([place])  Total Time spent with patient: 15 minutes  Past Psychiatric History: See admission H&P  Past Medical History:  Past Medical History:  Diagnosis Date  . Anxiety   . Chronic kidney disease   . Chronic pain    previously saw Dr. Consuela Mimes in pain clinic, then saw pain specialist in [place]  . Depression   . Diabetes mellitus ([place])   . Frequency of urination    . GERD (gastroesophageal reflux disease)   . Headache(784.0)   . High cholesterol   . Hypertension   . IBS (irritable bowel syndrome)   . Left ankle instability   . Left knee DJD   . Lumbar Degenerative Disc Disease of  10/11/2014  . Neuromuscular disorder ([place])   . Osteoarthritis of hip (Right) 05/05/2015  . Other enthesopathy of ankle and tarsus 12/15/2009   Qualifier: Diagnosis of  By: Oneida Alar MD, KARL    . Parkinson's disease ([place])   . Peripheral sensory neuropathy (Bilateral) 11/19/2014  . Postoperative nausea and vomiting   . Schizophrenia [facility])     Past Surgical History:  Procedure Laterality Date  . ABDOMINAL HYSTERECTOMY    . ANKLE SURGERY    . APPENDECTOMY    . COLONOSCOPY  2013  . COLONOSCOPY WITH PROPOFOL N/A 04/25/2017   Procedure: COLONOSCOPY WITH PROPOFOL;  Surgeon: Manya Silvas, MD;  Location: [facility] ENDOSCOPY;  Service: Endoscopy;  Laterality: N/A;  . ESOPHAGOGASTRODUODENOSCOPY (EGD) WITH PROPOFOL N/A 10/03/2014   Procedure: ESOPHAGOGASTRODUODENOSCOPY (EGD) WITH PROPOFOL;  Surgeon: Josefine Class, MD;  Location: [facility] ENDOSCOPY;  Service: Endoscopy;  Laterality: N/A;  . ESOPHAGOGASTRODUODENOSCOPY (EGD) WITH PROPOFOL  04/25/2017   Procedure: ESOPHAGOGASTRODUODENOSCOPY (EGD) WITH PROPOFOL;  Surgeon: Manya Silvas, MD;  Location: [facility] ENDOSCOPY;  Service: Endoscopy;;  . KNEE ARTHROSCOPY  1997   left knee  . LEFT HEART CATH AND CORONARY ANGIOGRAPHY Left 11/10/2017   Procedure: LEFT HEART CATH AND CORONARY  ANGIOGRAPHY;  Surgeon: Isaias Cowman, MD;  Location: [facility] CV LAB;  Service: Cardiovascular;  Laterality: Left;  . LEFT HEART CATHETERIZATION WITH CORONARY ANGIOGRAM N/A 01/11/2013   Procedure: LEFT HEART CATHETERIZATION WITH CORONARY ANGIOGRAM;  Surgeon: Sinclair Grooms, MD;  Location: [facility] CATH LAB;  Service: Cardiovascular;  Laterality: N/A;  . TOTAL KNEE ARTHROPLASTY  08/30/2011   Procedure: TOTAL KNEE ARTHROPLASTY;  Surgeon: Lorn Junes, MD;   Location: [place];  Service: Orthopedics;  Laterality: Left;   Family History:  Family History  Problem Relation Age of Onset  . Cancer Mother   . Hypertension Father   . Heart disease Father   . Alcohol abuse Father    Family Psychiatric  History: See admission H&P Social History:  Social History   Substance and Sexual Activity  Alcohol Use No  . Alcohol/week: 0.0 standard drinks     Social History   Substance and Sexual Activity  Drug Use No    Social History   Socioeconomic History  . Marital status: Divorced    Spouse name: Not on file  . Number of children: 2  . Years of education: 76  . Highest education level: 11th grade  Occupational History    Employer: [employer]  . Financial resource strain: Somewhat hard  . Food insecurity    Worry: Never true    Inability: Never true  . Transportation needs    Medical: No    Non-medical: No  Tobacco Use  . Smoking status: Former Smoker    Packs/day: 2.00    Years: 15.00    Pack years: 30.00    Types: Cigarettes    Quit date: 02/09/1995    Years since quitting: 23.5  . Smokeless tobacco: Never Used  Substance and Sexual Activity  . Alcohol use: No    Alcohol/week: 0.0 standard drinks  . Drug use: No  . Sexual activity: Not Currently  Lifestyle  . Physical activity    Days per week: 0 days    Minutes per session: 0 min  . Stress: Rather much  Relationships  . Social connections    Talks on phone: More than three times a week    Gets together: Once a week    Attends religious service: Never    Active member of club or organization: No    Attends meetings of clubs or organizations: Never    Relationship status: Divorced  Other Topics Concern  . Not on file  Social History Narrative   Patient lives at home alone. Patient works at [employer].   Caffeine daily- 2   Right handed.   Education- 11 th grade   Additional Social History:                         Sleep:  Fair  Appetite:  Fair  Current Medications: Current Facility-Administered Medications  Medication Dose Route Frequency Provider Last Rate Last Dose  . amantadine (SYMMETREL) capsule 100 mg  100 mg Oral BID Johnn Hai, MD   100 mg at 08/27/18 0825  . LORazepam (ATIVAN) tablet 0.5 mg  0.5 mg Oral TID Johnn Hai, MD      . metFORMIN (GLUCOPHAGE) tablet 500 mg  500 mg Oral BID WC Johnn Hai, MD   500 mg at 08/27/18 0824  . mirtazapine (REMERON SOL-TAB) disintegrating tablet 15 mg  15 mg Oral QHS Johnn Hai, MD   15 mg at 08/26/18 2214  .  modafinil (PROVIGIL) tablet 100 mg  100 mg Oral Daily Johnn Hai, MD      . pantoprazole (PROTONIX) EC tablet 40 mg  40 mg Oral BID Johnn Hai, MD      . risperiDONE (RISPERDAL) tablet 3 mg  3 mg Oral BID Johnn Hai, MD      . rOPINIRole (REQUIP) tablet 0.5 mg  0.5 mg Oral BID Johnn Hai, MD   0.5 mg at 08/26/18 2012  . traMADol (ULTRAM) tablet 100 mg  100 mg Oral Q12H Johnn Hai, MD   50 mg at 08/27/18 3762    Lab Results:  Results for orders placed or performed during the hospital encounter of 08/22/18 (from the past 48 hour(s))  Glucose, capillary     Status: Abnormal   Collection Time: 08/26/18 12:07 PM  Result Value Ref Range   Glucose-Capillary 157 (H) 70 - 99 mg/dL  Glucose, capillary     Status: Abnormal   Collection Time: 08/27/18  5:55 AM  Result Value Ref Range   Glucose-Capillary 146 (H) 70 - 99 mg/dL    Blood Alcohol level:  Lab Results  Component Value Date   ETH <10 08/21/2018   ETH <10 83/15/1761    Metabolic Disorder Labs: Lab Results  Component Value Date   HGBA1C 6.2 (H) 03/06/2017   MPG 131.24 03/06/2017   MPG 108 05/17/2015   Lab Results  Component Value Date   PROLACTIN 64.2 (H) 03/06/2017   Lab Results  Component Value Date   CHOL 146 08/22/2018   TRIG 172 (H) 08/22/2018   HDL 49 08/22/2018   CHOLHDL 3.0 08/22/2018   VLDL 34 08/22/2018   LDLCALC 63 08/22/2018   LDLCALC 59 03/06/2017     Physical Findings: AIMS: Facial and Oral Movements Muscles of Facial Expression: None, normal Lips and Perioral Area: None, normal Jaw: None, normal Tongue: None, normal,Extremity Movements Upper (arms, wrists, hands, fingers): None, normal Lower (legs, knees, ankles, toes): None, normal, Trunk Movements Neck, shoulders, hips: None, normal, Overall Severity Severity of abnormal movements (highest score from questions above): None, normal Incapacitation due to abnormal movements: None, normal Patient's awareness of abnormal movements (rate only patient's report): No Awareness, Dental Status Current problems with teeth and/or dentures?: No Does patient usually wear dentures?: No  CIWA:    COWS:     Musculoskeletal: Strength & Muscle Tone: within normal limits Gait & Station: ataxic Patient leans: N/A  Psychiatric Specialty Exam: Physical Exam  Nursing note and vitals reviewed. Constitutional: She is oriented to person, place, and time. She appears well-developed and well-nourished.  HENT:  Head: Normocephalic and atraumatic.  Respiratory: Effort normal.  Neurological: She is alert and oriented to person, place, and time.    ROS  Blood pressure 127/83, pulse 94, temperature 98.5 F (36.9 C), temperature source Oral, resp. rate 18, height 5\' 5"  (1.651 m), weight 108.9 kg, SpO2 96 %.Body mass index is 39.94 kg/m.  General Appearance: Disheveled  Eye Contact:  Fair  Speech:  Normal Rate  Volume:  Normal  Mood:  Dysphoric and Irritable  Affect:  Congruent  Thought Process:  Coherent and Descriptions of Associations: Circumstantial  Orientation:  Full (Time, Place, and Person)  Thought Content:  Rumination  Suicidal Thoughts:  No  Homicidal Thoughts:  No  Memory:  Immediate;   Fair Recent;   Fair Remote;   Fair  Judgement:  Impaired  Insight:  Lacking  Psychomotor Activity:  Normal  Concentration:  Concentration: Fair and Attention Span: Fair  Recall:  Smiley Houseman  of Knowledge:  Fair  Language:  Fair  Akathisia:  Negative  Handed:  Right  AIMS (if indicated):     Assets:  Desire for Improvement Resilience  ADL's:  Impaired  Cognition:  WNL  Sleep:  Number of Hours: 5     Treatment Plan Summary: Daily contact with patient to assess and evaluate symptoms and progress in treatment, Medication management and Plan : Patient is seen and examined.  Patient is a 58 year old female with the above-stated past psychiatric history who is seen in follow-up.   Diagnosis: #1 schizophrenia, #2 Parkinson's, #3 diabetes mellitus type 2, #4 chronic pain, #5 chronic kidney disease, #6 GERD, #7 hypertension, #8 degenerative disc disease as well as lower extremity arthritis, #9 sensory bilateral neuropathy  She continues to complain of oversedation.  She is sleeping at night, but not well.  She has refused her Risperdal at times, and we discussed that today.  She does not appear to be severely psychotic, homicidal or suicidal at this time.  There were no tremor issues appearing this morning.  Her blood sugar stable.  I would like to at least repeat her blood pressure.  The only change I am going to make this morning is to change her lorazepam to PRN. 1.  Change lorazepam 0.5 mg p.o. 3 times daily to prn TID for anxiety. 2.  Continue Metformin 500 mg p.o. twice daily with food for diabetes mellitus type 2 3.  Check blood sugar in a.m. 4.  Continue mirtazapine 15 mg p.o. nightly for depression, anxiety as well as insomnia. 5.  Continue Provigil 100 mg p.o. daily for alertness. 6.  Continue Protonix 40 mg p.o. twice daily for reflux disease. 7.  Continue Risperdal 3 mg p.o. twice daily for psychosis. 8.  Continue Requip for Parkinson's symptoms as well as side effects of medications. 9.  Continue tramadol 100 mg p.o. every 12 hours for chronic pain. 10.  Get vital signs on patient  11. disposition planning-in progress.  Sharma Covert, MD 08/27/2018, 9:02 AM

## 2018-08-27 NOTE — Progress Notes (Signed)
Patient ID: Heather Haley, female   DOB: [birth year], 59 y.o.   MRN: [id number]   [place] NOVEL CORONAVIRUS (COVID-19) DAILY CHECK-OFF SYMPTOMS - answer yes or no to each - every day NO YES  Have you had a fever in the past 24 hours?  . Fever (Temp > 37.80C / 100F) X   Have you had any of these symptoms in the past 24 hours? . New Cough .  Sore Throat  .  Shortness of Breath .  Difficulty Breathing .  Unexplained Body Aches   X   Have you had any one of these symptoms in the past 24 hours not related to allergies?   . Runny Nose .  Nasal Congestion .  Sneezing   X   If you have had runny nose, nasal congestion, sneezing in the past 24 hours, has it worsened?  X   EXPOSURES - check yes or no X   Have you traveled outside the state in the past 14 days?  X   Have you been in contact with someone with a confirmed diagnosis of COVID-19 or PUI in the past 14 days without wearing appropriate PPE?  X   Have you been living in the same home as a person with confirmed diagnosis of COVID-19 or a PUI (household contact)?    X   Have you been diagnosed with COVID-19?    X              What to do next: Answered NO to all: Answered YES to anything:   Proceed with unit schedule Follow the [facility] Inpatient Flowsheet.

## 2018-08-27 NOTE — Progress Notes (Signed)
D: Patient presents apathetic, delusional. She reports AH of "a conversation." She continues to refuse meds, but did take symmetrel, tramadol (half the dose) and metformin. Patient denies SI/HI.  A: Patient checked q15 min, and checks reviewed. Reviewed medication changes with patient and educated on side effects. Educated patient on importance of attending group therapy sessions and educated on several coping skills. Encouarged participation in milieu through recreation therapy and attending meals with peers. Support and encouragement provided. Fluids offered. R: Patient contracts for safety on the unit.

## 2018-08-28 LAB — GLUCOSE, CAPILLARY
Glucose-Capillary: 179 mg/dL — ABNORMAL HIGH (ref 70–99)
Glucose-Capillary: 160 mg/dL — ABNORMAL HIGH (ref 70–99)

## 2018-08-28 MED ORDER — ROPINIROLE HCL 0.5 MG PO TABS: 0.5000 mg | ORAL_TABLET | Freq: Two times a day (BID) | ORAL | 1 refills | Status: DC

## 2018-08-28 MED ORDER — RISPERIDONE 3 MG PO TABS: 6.0000 mg | ORAL_TABLET | Freq: Every day | ORAL | 2 refills | Status: DC

## 2018-08-28 MED ORDER — AMANTADINE HCL 100 MG PO CAPS: 100.0000 mg | ORAL_CAPSULE | Freq: Two times a day (BID) | ORAL | 1 refills | Status: DC

## 2018-08-28 MED ORDER — MIRTAZAPINE 15 MG PO TBDP: 15.0000 mg | ORAL_TABLET | Freq: Every day | ORAL | 2 refills | Status: DC

## 2018-08-28 MED ORDER — TRAMADOL HCL 50 MG PO TABS: 100.0000 mg | ORAL_TABLET | Freq: Two times a day (BID) | ORAL | 1 refills | Status: DC

## 2018-08-28 NOTE — Discharge Summary (Signed)
Physician Discharge Summary Note  Patient:  Heather Haley is an 58 y.o., female MRN:  [id number] DOB:  02/12/1960 Patient phone:  [phone] (home)  Patient address:   [place] [address],  Total Time spent with patient: 45 minutes  Date of Admission:  08/22/2018 Date of Discharge: 08/28/2018  Reason for Admission:    History of Present Illness:  Heather Haley is 58 years of age she has a known history of a schizophrenic condition thought to be paranoid type, she is noncompliant and difficult interview due to her elective mutism/intermittent mutism during the interview.  Further history reveals that the patient had decompensated fairly recently and has been suffering from hallucinations both auditory and visual and making delusional statements. Previously was treated in January 2019 with a similar presentation.  At that point in time stabilized with oral aripiprazole   According to Eval: Heather Coffin Williamsonis an 58 y.o.femalethat presents this date with IVC. Per IVC: "[name] has been diagnosed with Schizophrenia but has not been taking her medication. [name] has not been sleeping and tending to her personal hygiene. [name] has been hallucinating and hearing voices. [name] drove to a police substation this date and asked officers, "Where is the golden angel." [name] kept telling officers to "get that man out of my back seat" when no one was in the car. [name] told officers that she was giving her car to [employer]. [name] believes that respondent may be abusing her medication. [name] is a danger to self and others." Patient presents with a agitated affect and is responding to internal stimuli as evidenced by patient pointing to the empty chair in the room and having conversations with them. This writer attempts to clarify who she is speaking to unsuccessfully. Patient does not seem to process the content of this writer's questions.  Patient does not acknowledge this writer's presence in the room and will not respond to questions. Information to complete assessment was obtained from admission notes and collateral from petitioner (daughter Heather Haley [phone]) who reported she last saw the patient on Friday 08/18/18. Daughter reported her mother was fine at that time and has decompensated over the weekend. Daughter reported her mother showed up at a police substation and gave her phone to an officer who found the daughter's phone number and contacted her. Daughter is unsure when her mother was diagnosed with her mental health disorder or where she obtains her medications from. Daughter reported that mother had no history of SA use.Per chart review patient was last seen on 03/02/17 when she presented with similar symptoms. Per that note on 03/02/17 patient had a noted history of schizophreniawith psychosisstating at that time"peopleare livingunder my house andelectrocuting me."Per notes this date patient isbrought in by [facility] Deputy under IVC papers that state: "[name] states: the respondent has been diagnosed with schizophrenia but has not been taking her medication. The respondent has not been sleeping and she has not been tending to her personal hygiene. The respondent has been hallucinating and hearing voices. Today the respondent drive into a police substation and asked the officers, "Where is the golden angel". When officers ask that she exit the car she told them to, "get the man out of the back seat first". There was no one in the back seat. The respondent also told the petitioner, that she was giving up her car to [employer]. In addition, she told the officers to take a picture of her because petitioner was going to  kill her. When petitioner and respondent arrived at respondent's home respondent told her the house/yard was full of people that had been partying all night. [name] advised no one was  present at the home. Finally, petitioner believes respondent is abusing her medication which causes her to get angry, aggressive and threatening".History on patient is limited. Case was staffed with Mariea Clonts DO who recommended a inpatient admission to assist with stabilization.  Principal Problem: <principal problem not specified> Discharge Diagnoses: Active Problems:   Schizophrenia [facility])   Past Psychiatric History: see eval  Past Medical History:  Past Medical History:  Diagnosis Date  . Anxiety   . Chronic kidney disease   . Chronic pain    previously saw Dr. Consuela Mimes in pain clinic, then saw pain specialist in [place]  . Depression   . Diabetes mellitus ([place])   . Frequency of urination   . GERD (gastroesophageal reflux disease)   . Headache(784.0)   . High cholesterol   . Hypertension   . IBS (irritable bowel syndrome)   . Left ankle instability   . Left knee DJD   . Lumbar Degenerative Disc Disease of  10/11/2014  . Neuromuscular disorder ([place])   . Osteoarthritis of hip (Right) 05/05/2015  . Other enthesopathy of ankle and tarsus 12/15/2009   Qualifier: Diagnosis of  By: Oneida Alar MD, KARL    . Parkinson's disease ([place])   . Peripheral sensory neuropathy (Bilateral) 11/19/2014  . Postoperative nausea and vomiting   . Schizophrenia [facility])     Past Surgical History:  Procedure Laterality Date  . ABDOMINAL HYSTERECTOMY    . ANKLE SURGERY    . APPENDECTOMY    . COLONOSCOPY  2013  . COLONOSCOPY WITH PROPOFOL N/A 04/25/2017   Procedure: COLONOSCOPY WITH PROPOFOL;  Surgeon: Manya Silvas, MD;  Location: [facility] ENDOSCOPY;  Service: Endoscopy;  Laterality: N/A;  . ESOPHAGOGASTRODUODENOSCOPY (EGD) WITH PROPOFOL N/A 10/03/2014   Procedure: ESOPHAGOGASTRODUODENOSCOPY (EGD) WITH PROPOFOL;  Surgeon: Josefine Class, MD;  Location: [facility] ENDOSCOPY;  Service: Endoscopy;  Laterality: N/A;  . ESOPHAGOGASTRODUODENOSCOPY (EGD) WITH PROPOFOL  04/25/2017   Procedure:  ESOPHAGOGASTRODUODENOSCOPY (EGD) WITH PROPOFOL;  Surgeon: Manya Silvas, MD;  Location: [facility] ENDOSCOPY;  Service: Endoscopy;;  . KNEE ARTHROSCOPY  1997   left knee  . LEFT HEART CATH AND CORONARY ANGIOGRAPHY Left 11/10/2017   Procedure: LEFT HEART CATH AND CORONARY ANGIOGRAPHY;  Surgeon: Isaias Cowman, MD;  Location: [place] CV LAB;  Service: Cardiovascular;  Laterality: Left;  . LEFT HEART CATHETERIZATION WITH CORONARY ANGIOGRAM N/A 01/11/2013   Procedure: LEFT HEART CATHETERIZATION WITH CORONARY ANGIOGRAM;  Surgeon: Sinclair Grooms, MD;  Location: [facility] CATH LAB;  Service: Cardiovascular;  Laterality: N/A;  . TOTAL KNEE ARTHROPLASTY  08/30/2011   Procedure: TOTAL KNEE ARTHROPLASTY;  Surgeon: Lorn Junes, MD;  Location: [place];  Service: Orthopedics;  Laterality: Left;   Family History:  Family History  Problem Relation Age of Onset  . Cancer Mother   . Hypertension Father   . Heart disease Father   . Alcohol abuse Father    Family Psychiatric  History: see eval Social History:  Social History   Substance and Sexual Activity  Alcohol Use No  . Alcohol/week: 0.0 standard drinks     Social History   Substance and Sexual Activity  Drug Use No    Social History   Socioeconomic History  . Marital status: Divorced    Spouse name: Not on file  . Number of children: 2  .  Years of education: 21  . Highest education level: 11th grade  Occupational History    Employer: [employer]  . Financial resource strain: Somewhat hard  . Food insecurity    Worry: Never true    Inability: Never true  . Transportation needs    Medical: No    Non-medical: No  Tobacco Use  . Smoking status: Former Smoker    Packs/day: 2.00    Years: 15.00    Pack years: 30.00    Types: Cigarettes    Quit date: 02/09/1995    Years since quitting: 23.5  . Smokeless tobacco: Never Used  Substance and Sexual Activity  . Alcohol use: No    Alcohol/week: 0.0 standard  drinks  . Drug use: No  . Sexual activity: Not Currently  Lifestyle  . Physical activity    Days per week: 0 days    Minutes per session: 0 min  . Stress: Rather much  Relationships  . Social connections    Talks on phone: More than three times a week    Gets together: Once a week    Attends religious service: Never    Active member of club or organization: No    Attends meetings of clubs or organizations: Never    Relationship status: Divorced  Other Topics Concern  . Not on file  Social History Narrative   Patient lives at home alone. Patient works at [employer].   Caffeine daily- 2   Right handed.   Education- 11 th grade    Hospital Course:   Patient was admitted under routine precautions displayed no dangerous behaviors towards self or others.  Overall was compliant with meds.  She did have a decreased intensity and frequency and conviction to her delusional believes she still remains somewhat focused on her spirituality and kept insisting throughout her stay that she never had schizophrenic symptoms until she was diagnosed with parkinsonism however we felt it best to go ahead and treat based on her history for both.   By the date of the 20th I think she is baseline she is ambulating well, she is alert fully oriented, has probably some residual hyper religious type delusions but no auditory or visual hallucinations other than hearing a "electric buzz sometimes" but no specific voices.  Again no thoughts of harming self or others no acute psychosis warranting inpatient care but some residual symptoms.  No EPS or TD. Musculoskeletal: Strength & Muscle Tone: within normal limits Gait & Station: normal Patient leans: N/A  Psychiatric Specialty Exam: ROS  Blood pressure 114/65, pulse (!) 111, temperature 97.9 F (36.6 C), resp. rate 18, height 5\' 5"  (1.651 m), weight 108.9 kg, SpO2 96 %.Body mass index is 39.94 kg/m.  General Appearance: Casual  Eye Contact::  Good  Speech:   Clear and Coherent409  Volume:  Normal  Mood: Stable and euthymic  Affect:  Constricted  Thought Process:  Coherent, Linear and Descriptions of Associations: Circumstantial  Orientation:  Full (Time, Place, and Person)  Thought Content:  Rumination  Suicidal Thoughts:  No  Homicidal Thoughts:  No  Memory:  Recent;   Fair  Judgement:  Intact  Insight:  Fair  Psychomotor Activity:  Normal  Concentration:  Fair  Recall:  [employer] of Knowledge:Fair  Language: Fair  Akathisia:  Negative  Handed:  Right  AIMS (if indicated):     Assets:  Physical Health Resilience  Sleep:  Number of Hours: 4.75  Cognition: WNL  ADL's:  Intact    Physical Findings: AIMS: Facial and Oral Movements Muscles of Facial Expression: None, normal Lips and Perioral Area: None, normal Jaw: None, normal Tongue: None, normal,Extremity Movements Upper (arms, wrists, hands, fingers): None, normal Lower (legs, knees, ankles, toes): None, normal, Trunk Movements Neck, shoulders, hips: None, normal, Overall Severity Severity of abnormal movements (highest score from questions above): None, normal Incapacitation due to abnormal movements: None, normal Patient's awareness of abnormal movements (rate only patient's report): No Awareness, Dental Status Current problems with teeth and/or dentures?: No Does patient usually wear dentures?: No  CIWA:    COWS:       Has this patient used any form of tobacco in the last 30 days? (Cigarettes, Smokeless Tobacco, Cigars, and/or Pipes) Yes, No  Blood Alcohol level:  Lab Results  Component Value Date   ETH <10 08/21/2018   ETH <10 09/98/3382    Metabolic Disorder Labs:  Lab Results  Component Value Date   HGBA1C 6.2 (H) 03/06/2017   MPG 131.24 03/06/2017   MPG 108 05/17/2015   Lab Results  Component Value Date   PROLACTIN 64.2 (H) 03/06/2017   Lab Results  Component Value Date   CHOL 146 08/22/2018   TRIG 172 (H) 08/22/2018   HDL 49 08/22/2018    CHOLHDL 3.0 08/22/2018   VLDL 34 08/22/2018   LDLCALC 63 08/22/2018   LDLCALC 59 03/06/2017    See Psychiatric Specialty Exam and Suicide Risk Assessment completed by Attending Physician prior to discharge.  Discharge destination:  Home  Is patient on multiple antipsychotic therapies at discharge:  No   Has Patient had three or more failed trials of antipsychotic monotherapy by history:  No  Recommended Plan for Multiple Antipsychotic Therapies: NA   Allergies as of 08/28/2018      Reactions   Prolixin Decanoate [fluphenazine]    Benztropine Other (See Comments)   Hair fall out  Suicide thoughts   Buprenorphine Hcl Other (See Comments)   Unable to void   Carbidopa-levodopa Nausea Only   Codeine Hives   REACTION: hives   Duloxetine Other (See Comments)   Altered mental status Alopecia, visual hallucinations, nightmares   Gabapentin Swelling   Lyrica [pregabalin] Other (See Comments)   Alopecia, visual hallucinations, nightmares Altered mental status   Morphine And Related Other (See Comments)   Unable to void   Nortripytline Hcl [nortriptyline] Other (See Comments)   Hair loss and night mares   Nsaids    REACTION: palpitations, diaphoresis   Penicillins Nausea And Vomiting, Other (See Comments)   REACTION: upset stomach Has patient had a PCN reaction causing immediate rash, facial/tongue/throat swelling, SOB or lightheadedness with hypotension: No Has patient had a PCN reaction causing severe rash involving mucus membranes or skin necrosis: No Has patient had a PCN reaction that required hospitalization: No Has patient had a PCN reaction occurring within the last 10 years: No If all of the above answers are "NO", then may proceed with Cephalosporin use.   Tolmetin Other (See Comments)   REACTION: palpitations, diaphoresis   Wellbutrin [bupropion]    Constipation, mood swings      Medication List    STOP taking these medications   acetaminophen 500 MG  tablet Commonly known as: TYLENOL   HYDROcodone-acetaminophen 5-325 MG tablet Commonly known as: NORCO/VICODIN     TAKE these medications     Indication  albuterol 108 (90 Base) MCG/ACT inhaler Commonly known as: VENTOLIN HFA Inhale 2 puffs into the lungs every  6 (six) hours as needed for wheezing or shortness of breath.  Indication: Chronic Obstructive Lung Disease   amantadine 100 MG capsule Commonly known as: SYMMETREL Take 1 capsule (100 mg total) by mouth 2 (two) times daily.  Indication: Extrapyramidal Reaction caused by Medications   aspirin 81 MG tablet Take 81 mg by mouth daily.  Indication: Pain, Joint Damage causing Pain and Loss of Function   carbidopa-levodopa 25-100 MG tablet Commonly known as: SINEMET IR Take 1 tablet by mouth 3 (three) times daily.  Indication: Nervous System Injury-Induced Parkinsonism   cetirizine 10 MG tablet Commonly known as: ZYRTEC Take 10 mg by mouth daily.  Indication: Angioedema   cholecalciferol 1000 units tablet Commonly known as: VITAMIN D Take 1,000 Units by mouth daily.  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  Indication: Constipation   cyclobenzaprine 10 MG tablet Commonly known as: FLEXERIL Take 10 mg by mouth 2 (two) times daily.  Indication: Muscle Spasm   diphenhydrAMINE 25 mg capsule Commonly known as: Benadryl Allergy Take 1 capsule (25 mg total) by mouth daily as needed. For side effects of tremors due to zyprexa  Indication: Runny Nose   furosemide 20 MG tablet Commonly known as: LASIX Take 20 mg by mouth daily.  Indication: High Blood Pressure Disorder   glimepiride 1 MG tablet Commonly known as: AMARYL Take 1 mg by mouth daily with breakfast.  Indication: Type 2 Diabetes   hydroxypropyl methylcellulose / hypromellose 2.5 % ophthalmic solution Commonly known as: ISOPTO TEARS /  GONIOVISC Place 1 drop into both eyes every 4 (four) hours as needed for dry eyes.  Indication: 21-Hydroxylase Deficiency   losartan 25 MG tablet Commonly known as: COZAAR Take 25 mg by mouth daily.  Indication: High Blood Pressure Disorder   Melatonin 3 MG Tabs Take 1 tablet (3 mg total) by mouth at bedtime. TAKE IT 90 MINUTES PRIOR TO BEDTIME FOR SLEEP  Indication: Depression   metFORMIN 500 MG tablet Commonly known as: GLUCOPHAGE Take 1 tablet (500 mg total) by mouth 2 (two) times daily with a meal. For diabetes management  Indication: Type 2 Diabetes   mirtazapine 15 MG disintegrating tablet Commonly known as: REMERON SOL-TAB Take 1 tablet (15 mg total) by mouth at bedtime.  Indication: Major Depressive Disorder   omeprazole 40 MG capsule Commonly known as: PRILOSEC Take 40 mg by mouth daily as needed (heartburn).  Indication: Ulcer of the Duodenum   ondansetron 4 MG tablet Commonly known as: ZOFRAN Take 4 mg by mouth every 8 (eight) hours as needed for nausea or vomiting.  Indication: Nausea and Vomiting   pravastatin 40 MG tablet Commonly known as: PRAVACHOL Take 40 mg by mouth at bedtime.  Indication: High Amount of Triglycerides in the Blood   ranitidine 150 MG tablet Commonly known as: ZANTAC Take 150 mg by mouth daily as needed for heartburn.  Indication: Ulcer of the Duodenum   risperiDONE 3 MG tablet Commonly known as: RISPERDAL Take 2 tablets (6 mg total) by mouth at bedtime. What changed:   medication strength  how much to take  Indication: Delusions   rizatriptan 10 MG tablet Commonly known as: MAXALT Take 10 mg by mouth 2 (two) times daily. May repeat in 2 hours if needed  Indication: Migraine Headache   rOPINIRole 0.5 MG tablet Commonly known as: REQUIP Take 1 tablet (0.5 mg total) by mouth 2 (two)  times a day. What changed:   medication strength  how much to take  when to take this  Indication: Parkinson's Disease   senna 8.6 MG  tablet Commonly known as: SENOKOT Take 1-4 tablets (8.6-34.4 mg total) by mouth daily as needed for constipation. What changed:   how much to take  when to take this  Indication: Constipation   topiramate 100 MG tablet Commonly known as: TOPAMAX Take 0.5 tablets (50 mg total) by mouth at bedtime. For mood control  Indication: Mood control   traMADol 50 MG tablet Commonly known as: ULTRAM Take 2 tablets (100 mg total) by mouth 2 (two) times daily for 15 days. What changed:   when to take this  reasons to take this  Indication: Pain   verapamil 120 MG CR tablet Commonly known as: CALAN-SR Take 1 tablet (120 mg total) by mouth at bedtime. For high blood pressure  Indication: High Blood Pressure of Unknown Cause   vitamin B-12 1000 MCG tablet Commonly known as: CYANOCOBALAMIN Take 1,000 mcg by mouth daily.  Indication: Inadequate Vitamin B12      Follow-up Information    [facility] Follow up.           SignedJohnn Hai, MD 08/28/2018, 10:52 AM

## 2018-08-28 NOTE — Progress Notes (Signed)
  [facility] Adult Case Management Discharge Plan :  Will you be returning to the same living situation after discharge:  Yes,  home At discharge, do you have transportation home?: Yes,  pt's son Do you have the ability to pay for your medications: Yes,  medicare  Release of information consent forms completed and in the chart;  Patient's signature needed at discharge.  Patient to Follow up at: Follow-up Information    [facility] Follow up.           Next level of care provider has access to [place] and Suicide Prevention discussed: Yes,  pt's daughter     Has patient been referred to the Quitline?: N/A patient is not a smoker  Patient has been referred for addiction treatment: Yes  Trecia Rogers, LCSW 08/28/2018, 1:08 PM

## 2018-08-28 NOTE — Progress Notes (Signed)
Patient ID: Heather Haley, female   DOB: 04/25/1960, 58 y.o.   MRN: [id number]   CSW contacted pt's daughter to inform that the pt will be discharged today. Pt's daughter stated that she is "dumbfounded". She stated that the patient refuses to take her medications and will refuse once she comes home. Pt's daughter states that she is trying to get legal guardianship of her mother.

## 2018-08-28 NOTE — [facility] Suicide Risk Assessment (Signed)
[facility] Discharge Suicide Risk Assessment   Principal Problem: <principal problem not specified> Discharge Diagnoses: Active Problems:   Schizophrenia ([place])   Total Time spent with patient: 45 minutes  Musculoskeletal: Strength & Muscle Tone: within normal limits Gait & Station: normal Patient leans: N/A  Psychiatric Specialty Exam: ROS  Blood pressure 114/65, pulse (!) 111, temperature 97.9 F (36.6 C), resp. rate 18, height 5\' 5"  (1.651 m), weight 108.9 kg, SpO2 96 %.Body mass index is 39.94 kg/m.  General Appearance: Casual  Eye Contact::  Good  Speech:  Clear and Coherent409  Volume:  Normal  Mood: Stable and euthymic  Affect:  Constricted  Thought Process:  Coherent, Linear and Descriptions of Associations: Circumstantial  Orientation:  Full (Time, Place, and Person)  Thought Content:  Rumination  Suicidal Thoughts:  No  Homicidal Thoughts:  No  Memory:  Recent;   Fair  Judgement:  Intact  Insight:  Fair  Psychomotor Activity:  Normal  Concentration:  Fair  Recall:  [employer] of Knowledge:Fair  Language: Fair  Akathisia:  Negative  Handed:  Right  AIMS (if indicated):     Assets:  Physical Health Resilience  Sleep:  Number of Hours: 4.75  Cognition: WNL  ADL's:  Intact   Mental Status Per Nursing Assessment::   On Admission:  NA  Demographic Factors:  Caucasian  Loss Factors: NA  Historical Factors: NA  Risk Reduction Factors:   Sense of responsibility to family and Religious beliefs about death  Continued Clinical Symptoms:  Schizophrenia:   Paranoid or undifferentiated type  Cognitive Features That Contribute To Risk:  Polarized thinking    Suicide Risk:  Minimal: No identifiable suicidal ideation.  Patients presenting with no risk factors but with morbid ruminations; may be classified as minimal risk based on the severity of the depressive symptoms  Follow-up Information    [facility] Follow up.            Plan Of Care/Follow-up recommendations:  Activity:  full  Tisa Weisel, MD 08/28/2018, 10:50 AM

## 2018-08-28 NOTE — Progress Notes (Signed)
Recreation Therapy Notes  Date: 08/28/2018 Time: 10:00 am Location: [address]   Group Topic: Anger Thermometer  Goal Area(s) Addresses:  Patient will work on Academic librarian. Patient will follow directions on first prompt.  Behavioral Response: Appropriate  Intervention: Worksheet  Activity:  Staff on [address] were provided with a worksheet on Anger Thermometer. Staff was instructed to give it to the patients and have them work on it in place of [place]. Staff was also given 2 coloring sheets and 2 word searches and were given the option to give them out.  Education:  Ability to follow Directions, Change of thought processes Discharge Planning, Goal Planning.   Education Outcome: Acknowledges education/In group clarification offered  Clinical Observations/Feedback: . Due to COVID-19, guidelines group was not held. Group members were provided a learning activity worksheet to work on the topic and above-stated goals. LRT is available to answer any questions patient may have regarding the worksheet.  Tomi Likens, LRT/CTRS         Laylah Riga L Amanda Pote 08/28/2018 10:10 AM

## 2018-08-28 NOTE — Plan of Care (Signed)
Discharge note  Patient verbalizes readiness for discharge. Follow up plan explained, AVS, Transition record and SRA given. Prescriptions and teaching provided. Belongings returned and signed for. Suicide safety plan completed and signed. Patient verbalizes understanding. Patient denies SI/HI and assures this Probation officer he will seek assistance should that change. Patient discharged to lobby where son was waiting.  Problem: Activity: Goal: Will verbalize the importance of balancing activity with adequate rest periods Outcome: Adequate for Discharge   Problem: Education: Goal: Will be free of psychotic symptoms Outcome: Adequate for Discharge Goal: Knowledge of the prescribed therapeutic regimen will improve Outcome: Adequate for Discharge   Problem: Coping: Goal: Coping ability will improve Outcome: Adequate for Discharge Goal: Will verbalize feelings Outcome: Adequate for Discharge   Problem: Health Behavior/Discharge Planning: Goal: Compliance with prescribed medication regimen will improve Outcome: Adequate for Discharge   Problem: Nutritional: Goal: Ability to achieve adequate nutritional intake will improve Outcome: Adequate for Discharge   Problem: Role Relationship: Goal: Ability to communicate needs accurately will improve Outcome: Adequate for Discharge Goal: Ability to interact with others will improve Outcome: Adequate for Discharge   Problem: Safety: Goal: Ability to redirect hostility and anger into socially appropriate behaviors will improve Outcome: Adequate for Discharge Goal: Ability to remain free from injury will improve Outcome: Adequate for Discharge   Problem: Self-Care: Goal: Ability to participate in self-care as condition permits will improve Outcome: Adequate for Discharge   Problem: Self-Concept: Goal: Will verbalize positive feelings about self Outcome: Adequate for Discharge

## 2018-08-28 NOTE — Progress Notes (Signed)
Recreation Therapy Notes  INPATIENT RECREATION TR PLAN  Patient Details Name: YOLONDA PURTLE MRN: [id number] DOB: [birth year] Today's Date: 08/28/2018  Rec Therapy Plan Is patient appropriate for Therapeutic Recreation?: Yes Treatment times per week: about 3 days Estimated Length of Stay: 5-7 days TR Treatment/Interventions: Group participation (Comment)  Discharge Criteria Pt will be discharged from therapy if:: Discharged Treatment plan/goals/alternatives discussed and agreed upon by:: Patient/family  Discharge Summary Short term goals set: see patient care plan Short term goals met: Not met Reason goals not met: patient did not attend groups Therapeutic equipment acquired: none Reason patient discharged from therapy: Discharge from hospital Pt/family agrees with progress & goals achieved: Yes Date patient discharged from therapy: 08/28/18  Tomi Likens, LRT/CTRS  [place] 08/28/2018, 4:23 PM

## 2018-08-28 NOTE — Tx Team (Signed)
Interdisciplinary Treatment and Diagnostic Plan Update  08/28/2018 Time of Session: 09:00am Heather Haley MRN: [id number]  Principal Diagnosis: <principal problem not specified>  Secondary Diagnoses: Active Problems:   Schizophrenia ([place])   Current Medications:  Current Facility-Administered Medications  Medication Dose Route Frequency Provider Last Rate Last Dose  . amantadine (SYMMETREL) capsule 100 mg  100 mg Oral BID Johnn Hai, MD   100 mg at 08/28/18 0809  . LORazepam (ATIVAN) tablet 0.5 mg  0.5 mg Oral TID PRN Sharma Covert, MD      . metFORMIN (GLUCOPHAGE) tablet 500 mg  500 mg Oral BID WC Johnn Hai, MD   500 mg at 08/28/18 0809  . mirtazapine (REMERON SOL-TAB) disintegrating tablet 15 mg  15 mg Oral QHS Johnn Hai, MD   15 mg at 08/27/18 2122  . modafinil (PROVIGIL) tablet 100 mg  100 mg Oral Daily Johnn Hai, MD   100 mg at 08/28/18 0809  . pantoprazole (PROTONIX) EC tablet 40 mg  40 mg Oral BID Johnn Hai, MD   40 mg at 08/28/18 0810  . risperiDONE (RISPERDAL) tablet 3 mg  3 mg Oral BID Johnn Hai, MD   3 mg at 08/28/18 0810  . rOPINIRole (REQUIP) tablet 0.5 mg  0.5 mg Oral BID Johnn Hai, MD   0.5 mg at 08/28/18 9509  . traMADol (ULTRAM) tablet 100 mg  100 mg Oral Q12H Johnn Hai, MD   100 mg at 08/28/18 0809   PTA Medications: Medications Prior to Admission  Medication Sig Dispense Refill Last Dose  . acetaminophen (TYLENOL) 500 MG tablet Take 1 tablet (500 mg total) by mouth every 6 (six) hours as needed for mild pain. 30 tablet 0   . albuterol (PROVENTIL HFA;VENTOLIN HFA) 108 (90 Base) MCG/ACT inhaler Inhale 2 puffs into the lungs every 6 (six) hours as needed for wheezing or shortness of breath.     Marland Kitchen aspirin 81 MG tablet Take 81 mg by mouth daily.      . carbidopa-levodopa (SINEMET IR) 25-100 MG tablet Take 1 tablet by mouth 3 (three) times daily.     . cetirizine (ZYRTEC) 10 MG tablet Take 10 mg by mouth daily.     . cholecalciferol  (VITAMIN D) 1000 units tablet Take 1,000 Units by mouth daily.     . cyclobenzaprine (FLEXERIL) 10 MG tablet Take 10 mg by mouth 2 (two) times daily.      . diphenhydrAMINE (BENADRYL ALLERGY) 25 mg capsule Take 1 capsule (25 mg total) by mouth daily as needed. For side effects of tremors due to zyprexa 30 capsule 2   . docusate sodium (COLACE) 100 MG capsule Take 100 mg by mouth 2 (two) times daily.      . furosemide (LASIX) 20 MG tablet Take 20 mg by mouth daily.      Marland Kitchen glimepiride (AMARYL) 1 MG tablet Take 1 mg by mouth daily with breakfast.      . HYDROcodone-acetaminophen (NORCO/VICODIN) 5-325 MG tablet Take 0.5 tablets by mouth 2 (two) times daily. Every afternoon and at bedtime     . hydroxypropyl methylcellulose / hypromellose (ISOPTO TEARS / GONIOVISC) 2.5 % ophthalmic solution Place 1 drop into both eyes every 4 (four) hours as needed for dry eyes.     Marland Kitchen losartan (COZAAR) 25 MG tablet Take 25 mg by mouth daily.     . Melatonin 3 MG TABS Take 1 tablet (3 mg total) by mouth at bedtime. TAKE IT 90 MINUTES PRIOR TO BEDTIME  FOR SLEEP 100 tablet 0   . metFORMIN (GLUCOPHAGE) 500 MG tablet Take 1 tablet (500 mg total) by mouth 2 (two) times daily with a meal. For diabetes management 10 tablet 0   . omeprazole (PRILOSEC) 40 MG capsule Take 40 mg by mouth daily as needed (heartburn).      . ondansetron (ZOFRAN) 4 MG tablet Take 4 mg by mouth every 8 (eight) hours as needed for nausea or vomiting.     . pravastatin (PRAVACHOL) 40 MG tablet Take 40 mg by mouth at bedtime.      . ranitidine (ZANTAC) 150 MG tablet Take 150 mg by mouth daily as needed for heartburn.      . risperiDONE (RISPERDAL) 0.5 MG tablet Take 1 tablet (0.5 mg total) by mouth at bedtime. 90 tablet 0   . rizatriptan (MAXALT) 10 MG tablet Take 10 mg by mouth 2 (two) times daily. May repeat in 2 hours if needed      . rOPINIRole (REQUIP) 0.25 MG tablet Take by mouth.     . senna (SENOKOT) 8.6 MG tablet Take 1-4 tablets (8.6-34.4 mg  total) by mouth daily as needed for constipation. (Patient taking differently: Take 1 tablet by mouth at bedtime. )     . topiramate (TOPAMAX) 100 MG tablet Take 0.5 tablets (50 mg total) by mouth at bedtime. For mood control 30 tablet 1   . traMADol (ULTRAM) 50 MG tablet Take 2 tablets (100 mg total) by mouth every 8 (eight) hours as needed for moderate pain or severe pain. (Patient taking differently: Take 50-100 mg by mouth See admin instructions. 100 mg every morning, 50 mg every afternoon, and 50 mg at bedtime) 180 tablet 2   . verapamil (CALAN-SR) 120 MG CR tablet Take 1 tablet (120 mg total) by mouth at bedtime. For high blood pressure 90 tablet 1   . vitamin B-12 (CYANOCOBALAMIN) 1000 MCG tablet Take 1,000 mcg by mouth daily.       Patient Stressors:    Patient Strengths:    Treatment Modalities: Medication Management, Group therapy, Case management,  1 to 1 session with clinician, Psychoeducation, Recreational therapy.   Physician Treatment Plan for Primary Diagnosis: <principal problem not specified> Long Term Goal(s): Improvement in symptoms so as ready for discharge Improvement in symptoms so as ready for discharge   Short Term Goals: Ability to verbalize feelings will improve Ability to disclose and discuss suicidal ideas Ability to demonstrate self-control will improve Ability to identify and develop effective coping behaviors will improve Ability to disclose and discuss suicidal ideas Ability to demonstrate self-control will improve Ability to identify and develop effective coping behaviors will improve Compliance with prescribed medications will improve  Medication Management: Evaluate patient's response, side effects, and tolerance of medication regimen.  Therapeutic Interventions: 1 to 1 sessions, Unit Group sessions and Medication administration.  Evaluation of Outcomes: Adequate for Discharge  Physician Treatment Plan for Secondary Diagnosis: Active Problems:    Schizophrenia ([place])  Long Term Goal(s): Improvement in symptoms so as ready for discharge Improvement in symptoms so as ready for discharge   Short Term Goals: Ability to verbalize feelings will improve Ability to disclose and discuss suicidal ideas Ability to demonstrate self-control will improve Ability to identify and develop effective coping behaviors will improve Ability to disclose and discuss suicidal ideas Ability to demonstrate self-control will improve Ability to identify and develop effective coping behaviors will improve Compliance with prescribed medications will improve     Medication Management: Evaluate patient's  response, side effects, and tolerance of medication regimen.  Therapeutic Interventions: 1 to 1 sessions, Unit Group sessions and Medication administration.  Evaluation of Outcomes: Adequate for Discharge   RN Treatment Plan for Primary Diagnosis: <principal problem not specified> Long Term Goal(s): Knowledge of disease and therapeutic regimen to maintain health will improve  Short Term Goals: Ability to participate in decision making will improve, Ability to verbalize feelings will improve, Ability to disclose and discuss suicidal ideas, Ability to identify and develop effective coping behaviors will improve and Compliance with prescribed medications will improve  Medication Management: RN will administer medications as ordered by provider, will assess and evaluate patient's response and provide education to patient for prescribed medication. RN will report any adverse and/or side effects to prescribing provider.  Therapeutic Interventions: 1 on 1 counseling sessions, Psychoeducation, Medication administration, Evaluate responses to treatment, Monitor vital signs and CBGs as ordered, Perform/monitor CIWA, COWS, AIMS and Fall Risk screenings as ordered, Perform wound care treatments as ordered.  Evaluation of Outcomes: Adequate for Discharge   LCSW Treatment  Plan for Primary Diagnosis: <principal problem not specified> Long Term Goal(s): Safe transition to appropriate next level of care at discharge, Engage patient in therapeutic group addressing interpersonal concerns.  Short Term Goals: Engage patient in aftercare planning with referrals and resources and Increase skills for wellness and recovery  Therapeutic Interventions: Assess for all discharge needs, 1 to 1 time with Social worker, Explore available resources and support systems, Assess for adequacy in community support network, Educate family and significant other(s) on suicide prevention, Complete Psychosocial Assessment, Interpersonal group therapy.  Evaluation of Outcomes: Adequate for Discharge   Progress in Treatment: Attending groups: No. Participating in groups: No. Taking medication as prescribed: No. Toleration medication: No. Family/Significant other contact made: Yes, individual(s) contacted:  pt's daughter Patient understands diagnosis: No. Discussing patient identified problems/goals with staff: Yes. Medical problems stabilized or resolved: Yes. Denies suicidal/homicidal ideation: Yes. Issues/concerns per patient self-inventory: No. Other:   New problem(s) identified: No, Describe:  None  New Short Term/Long Term Goal(s): Medication stabilization, elimination of SI thoughts, and development of a comprehensive mental wellness plan.   Patient Goals:  "I want to get out today"  Discharge Plan or Barriers: Pt is discharging today. Pt's [facility] at [facility].   Reason for Continuation of Hospitalization: Patient is discharging today.   Estimated Length of Stay: Patient is discharging today.   Attendees: Patient: 08/28/2018  Physician: Dr. Johnn Hai, MD 08/28/2018  Nursing: Legrand Como, RN 08/28/2018  RN Care Manager: 08/28/2018  Social Worker: Ardelle Anton, LCSW 08/28/2018   Recreational Therapist:  08/28/2018   Other:  08/28/2018   Other:  08/28/2018   Other: 08/28/2018    Scribe for Treatment Team: Trecia Rogers, LCSW 08/28/2018 9:54 AM

## 2018-08-28 NOTE — Plan of Care (Signed)
Patient did not attend any groups provided

## 2018-08-29 ENCOUNTER — Other Ambulatory Visit (HOSPITAL_COMMUNITY): Payer: Medicare HMO

## 2018-08-29 ENCOUNTER — Ambulatory Visit (HOSPITAL_COMMUNITY): Payer: Medicare HMO

## 2018-08-30 ENCOUNTER — Observation Stay (HOSPITAL_COMMUNITY)
Admission: AD | Admit: 2018-08-30 | Discharge: 2018-08-30 | Disposition: A | Discharge status: Home / Self Care | Payer: Medicare HMO | Attending: Psychiatry | Admitting: Psychiatry

## 2018-08-30 ENCOUNTER — Other Ambulatory Visit (HOSPITAL_COMMUNITY): Payer: Medicare HMO

## 2018-08-30 ENCOUNTER — Encounter (HOSPITAL_COMMUNITY): Payer: Self-pay

## 2018-08-30 ENCOUNTER — Other Ambulatory Visit: Payer: Self-pay

## 2018-08-30 ENCOUNTER — Other Ambulatory Visit: Payer: Self-pay | Admitting: Nurse Practitioner

## 2018-08-30 DIAGNOSIS — Z1159 Encounter for screening for other viral diseases: Secondary | ICD-10-CM | POA: Insufficient documentation

## 2018-08-30 DIAGNOSIS — F419 Anxiety disorder, unspecified: Secondary | ICD-10-CM | POA: Insufficient documentation

## 2018-08-30 DIAGNOSIS — E1122 Type 2 diabetes mellitus with diabetic chronic kidney disease: Secondary | ICD-10-CM | POA: Insufficient documentation

## 2018-08-30 DIAGNOSIS — F200 Paranoid schizophrenia: Secondary | ICD-10-CM

## 2018-08-30 DIAGNOSIS — G709 Myoneural disorder, unspecified: Secondary | ICD-10-CM | POA: Diagnosis not present

## 2018-08-30 DIAGNOSIS — G20 Parkinson's disease: Secondary | ICD-10-CM | POA: Insufficient documentation

## 2018-08-30 DIAGNOSIS — K219 Gastro-esophageal reflux disease without esophagitis: Secondary | ICD-10-CM | POA: Diagnosis not present

## 2018-08-30 DIAGNOSIS — G8929 Other chronic pain: Secondary | ICD-10-CM | POA: Diagnosis not present

## 2018-08-30 DIAGNOSIS — Z7982 Long term (current) use of aspirin: Secondary | ICD-10-CM | POA: Diagnosis not present

## 2018-08-30 DIAGNOSIS — F209 Schizophrenia, unspecified: Secondary | ICD-10-CM | POA: Diagnosis not present

## 2018-08-30 DIAGNOSIS — F329 Major depressive disorder, single episode, unspecified: Secondary | ICD-10-CM | POA: Diagnosis not present

## 2018-08-30 DIAGNOSIS — Z046 Encounter for general psychiatric examination, requested by authority: Secondary | ICD-10-CM

## 2018-08-30 DIAGNOSIS — J45909 Unspecified asthma, uncomplicated: Secondary | ICD-10-CM | POA: Insufficient documentation

## 2018-08-30 DIAGNOSIS — Z79899 Other long term (current) drug therapy: Secondary | ICD-10-CM | POA: Insufficient documentation

## 2018-08-30 DIAGNOSIS — E7800 Pure hypercholesterolemia, unspecified: Secondary | ICD-10-CM | POA: Diagnosis not present

## 2018-08-30 DIAGNOSIS — N189 Chronic kidney disease, unspecified: Secondary | ICD-10-CM | POA: Insufficient documentation

## 2018-08-30 DIAGNOSIS — I129 Hypertensive chronic kidney disease with stage 1 through stage 4 chronic kidney disease, or unspecified chronic kidney disease: Secondary | ICD-10-CM | POA: Diagnosis not present

## 2018-08-30 DIAGNOSIS — Z7984 Long term (current) use of oral hypoglycemic drugs: Secondary | ICD-10-CM | POA: Insufficient documentation

## 2018-08-30 DIAGNOSIS — K589 Irritable bowel syndrome without diarrhea: Secondary | ICD-10-CM | POA: Insufficient documentation

## 2018-08-30 HISTORY — DX: Unspecified asthma, uncomplicated: J45.909

## 2018-08-30 LAB — GLUCOSE, CAPILLARY
Glucose-Capillary: 183 mg/dL — ABNORMAL HIGH (ref 70–99)
Glucose-Capillary: 201 mg/dL — ABNORMAL HIGH (ref 70–99)

## 2018-08-30 LAB — SARS CORONAVIRUS 2 BY RT PCR (HOSPITAL ORDER, PERFORMED IN ~~LOC~~ HOSPITAL LAB): SARS Coronavirus 2: NEGATIVE

## 2018-08-30 MED ORDER — VITAMIN D 25 MCG (1000 UNIT) PO TABS
1000.0000 [IU] | ORAL_TABLET | Freq: Every day | ORAL | Status: DC
  Administered 2018-08-30: 1000 [IU] via ORAL
  Filled 2018-08-30 (×3): qty 1

## 2018-08-30 MED ORDER — FAMOTIDINE 10 MG PO TABS
20.0000 mg | ORAL_TABLET | Freq: Every day | ORAL | Status: DC
  Administered 2018-08-30: 20 mg via ORAL
  Filled 2018-08-30: qty 2

## 2018-08-30 MED ORDER — GLIMEPIRIDE 1 MG PO TABS
1.0000 mg | ORAL_TABLET | Freq: Every day | ORAL | Status: DC
  Administered 2018-08-30: 1 mg via ORAL
  Filled 2018-08-30 (×3): qty 1

## 2018-08-30 MED ORDER — VITAMIN B-12 1000 MCG PO TABS
1000.0000 ug | ORAL_TABLET | Freq: Every day | ORAL | Status: DC
  Administered 2018-08-30: 1000 ug via ORAL
  Filled 2018-08-30 (×3): qty 1

## 2018-08-30 MED ORDER — RISPERIDONE 3 MG PO TABS: 6.0000 mg | ORAL_TABLET | Freq: Every day | ORAL | Status: DC

## 2018-08-30 MED ORDER — LORATADINE 10 MG PO TABS
10.0000 mg | ORAL_TABLET | Freq: Every day | ORAL | Status: DC
  Administered 2018-08-30: 10 mg via ORAL
  Filled 2018-08-30: qty 1

## 2018-08-30 MED ORDER — DOCUSATE SODIUM 100 MG PO CAPS
100.0000 mg | ORAL_CAPSULE | Freq: Two times a day (BID) | ORAL | Status: DC
  Administered 2018-08-30: 100 mg via ORAL
  Filled 2018-08-30: qty 1

## 2018-08-30 MED ORDER — MIRTAZAPINE 15 MG PO TBDP: 15.0000 mg | ORAL_TABLET | Freq: Every day | ORAL | Status: DC

## 2018-08-30 MED ORDER — MAGNESIUM HYDROXIDE 400 MG/5ML PO SUSP: 30.0000 mL | Freq: Every day | ORAL | Status: DC | PRN

## 2018-08-30 MED ORDER — ALUM & MAG HYDROXIDE-SIMETH 200-200-20 MG/5ML PO SUSP: 30.0000 mL | ORAL | Status: DC | PRN

## 2018-08-30 MED ORDER — ROPINIROLE HCL 0.5 MG PO TABS
0.5000 mg | ORAL_TABLET | Freq: Two times a day (BID) | ORAL | Status: DC
  Administered 2018-08-30: 0.5 mg via ORAL
  Filled 2018-08-30 (×5): qty 1

## 2018-08-30 MED ORDER — ACETAMINOPHEN 325 MG PO TABS: 650.0000 mg | ORAL_TABLET | Freq: Four times a day (QID) | ORAL | Status: DC | PRN

## 2018-08-30 MED ORDER — PANTOPRAZOLE SODIUM 40 MG PO TBEC: 40.0000 mg | DELAYED_RELEASE_TABLET | Freq: Every day | ORAL | Status: DC

## 2018-08-30 MED ORDER — METFORMIN HCL 500 MG PO TABS
500.0000 mg | ORAL_TABLET | Freq: Two times a day (BID) | ORAL | Status: DC
  Administered 2018-08-30: 500 mg via ORAL
  Filled 2018-08-30: qty 1

## 2018-08-30 MED ORDER — LOSARTAN POTASSIUM 50 MG PO TABS
25.0000 mg | ORAL_TABLET | Freq: Every day | ORAL | Status: DC
  Administered 2018-08-30: 25 mg via ORAL
  Filled 2018-08-30: qty 1

## 2018-08-30 MED ORDER — ALBUTEROL SULFATE HFA 108 (90 BASE) MCG/ACT IN AERS: 2.0000 | INHALATION_SPRAY | Freq: Four times a day (QID) | RESPIRATORY_TRACT | Status: DC | PRN

## 2018-08-30 MED ORDER — FUROSEMIDE 20 MG PO TABS
20.0000 mg | ORAL_TABLET | Freq: Every day | ORAL | Status: DC
  Administered 2018-08-30: 20 mg via ORAL
  Filled 2018-08-30: qty 1

## 2018-08-30 MED ORDER — SENNA 8.6 MG PO TABS
2.0000 | ORAL_TABLET | Freq: Every day | ORAL | Status: DC | PRN
  Filled 2018-08-30: qty 2

## 2018-08-30 MED ORDER — AMANTADINE HCL 100 MG PO CAPS
100.0000 mg | ORAL_CAPSULE | Freq: Two times a day (BID) | ORAL | Status: DC
  Administered 2018-08-30: 100 mg via ORAL
  Filled 2018-08-30 (×5): qty 1

## 2018-08-30 MED ORDER — TOPIRAMATE 25 MG PO TABS: 50.0000 mg | ORAL_TABLET | Freq: Every day | ORAL | Status: DC

## 2018-08-30 MED ORDER — VERAPAMIL HCL ER 120 MG PO TBCR
120.0000 mg | EXTENDED_RELEASE_TABLET | Freq: Every day | ORAL | Status: DC
  Filled 2018-08-30 (×2): qty 1

## 2018-08-30 MED ORDER — CARBIDOPA-LEVODOPA 25-100 MG PO TABS
1.0000 | ORAL_TABLET | Freq: Three times a day (TID) | ORAL | Status: DC
  Administered 2018-08-30: 1 via ORAL
  Filled 2018-08-30 (×7): qty 1

## 2018-08-30 MED ORDER — ASPIRIN EC 81 MG PO TBEC
81.0000 mg | DELAYED_RELEASE_TABLET | Freq: Every day | ORAL | Status: DC
  Administered 2018-08-30: 81 mg via ORAL
  Filled 2018-08-30: qty 1

## 2018-08-30 MED ORDER — PRAVASTATIN SODIUM 10 MG PO TABS: 40.0000 mg | ORAL_TABLET | Freq: Every day | ORAL | Status: DC

## 2018-08-30 MED ORDER — POLYVINYL ALCOHOL 1.4 % OP SOLN: 1.0000 [drp] | OPHTHALMIC | Status: DC | PRN

## 2018-08-30 NOTE — H&P (Signed)
Behavioral Health Medical Screening Exam  Heather Haley is an 58 y.o. female.  Psychiatric Specialty Exam: Physical Exam  Constitutional: She is oriented to person, place, and time. She appears well-developed and well-nourished. No distress.  HENT:  Head: Normocephalic and atraumatic.  Right Ear: External ear normal.  Left Ear: External ear normal.  Eyes: Pupils are equal, round, and reactive to light. Right eye exhibits no discharge. Left eye exhibits no discharge.  Respiratory: Effort normal. No respiratory distress.  Musculoskeletal: Normal range of motion.  Neurological: She is alert and oriented to person, place, and time.  Skin: She is not diaphoretic.    Review of Systems  Constitutional: Negative for chills, diaphoresis, fever, malaise/fatigue and weight loss.  Respiratory: Negative for cough and shortness of breath.   Cardiovascular: Negative for chest pain.  Gastrointestinal: Negative for diarrhea, nausea and vomiting.  Psychiatric/Behavioral: Positive for depression. Negative for hallucinations, memory loss, substance abuse and suicidal ideas. The patient is nervous/anxious and has insomnia.     Blood pressure (!) 129/97, pulse 99, temperature 98 F (36.7 C), temperature source Oral, resp. rate 18, SpO2 98 %.There is no height or weight on file to calculate BMI.  General Appearance: Casual and Fairly Groomed  Eye Contact:  Good  Speech:  Clear and Coherent and Normal Rate  Volume:  Normal  Mood:  Anxious and Depressed  Affect:  Blunt  Thought Process:  Coherent and Linear  Orientation:  Full (Time, Place, and Person)  Thought Content:  Delusions  Suicidal Thoughts:  No  Homicidal Thoughts:  No  Memory:  Immediate;   Fair Recent;   Fair  Judgement:  Intact  Insight:  Lacking  Psychomotor Activity:  Normal  Concentration:  Concentration: Fair and Attention Span: Fair  Recall:  Good  Fund of Knowledge:  Good  Language:  Good  Akathisia:  Negative  Handed:   Right  AIMS (if indicated):     Assets:  Agricultural consultant Housing Leisure Time  ADL's:  Intact  Cognition:  WNL  Sleep:           Blood pressure (!) 129/97, pulse 99, temperature 98 F (36.7 C), temperature source Oral, resp. rate 18, SpO2 98 %.  Recommendations:  Based on my evaluation the patient does not appear to have an emergency medical condition.  Rozetta Nunnery, NP 08/30/2018, 5:38 AM
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
## 2018-08-30 NOTE — BH Assessment (Signed)
[facility] Assessment Progress Note  Per Buford Dresser, DO, this pt does not require psychiatric hospitalization at this time.  Pt presents under IVC initiated by pt's son, which Dr Mariea Clonts will be rescinding.  Pt is to be discharged from [facility] with recommendation to continue treatment with her current providers in the [facility] system.  She has an appointment wih Lillie Fragmin LCSW on 09/05/2018 at 08:00, and one with Ursula Alert, MD on 10/09/2018 at 14:00.  These have been included in pt's discharge instructions.  Pt's nurse has been notified.  Jalene Mullet, [place] Triage Specialist [phone]
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
## 2018-08-30 NOTE — Discharge Instructions (Signed)
For your behavioral health needs you are advised to continue treatment with your current providers in the [facility] system.  You have the following appointments scheduled:       September 05, 2018 at 8:00 am      Heather Haley, [facility] [place]      [place], [place] [address]      [phone]       October 09, 2018 at 2:00 pm      [facility]      [address]      [place], [place] [address]      [phone]
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
## 2018-08-30 NOTE — H&P (Addendum)
[facility] Provider Admission PAA/H&P  Patient Identification: Heather Haley MRN:  [id number] Date of Evaluation:  08/30/2018 Chief Complaint:  Schizophrenia Principal Diagnosis: Schizophrenia ([place]) Diagnosis:  Principal Problem:   Schizophrenia ([place]) Active Problems:   Involuntary commitment  History of Present Illness:   Heather Haley is a 58 y.o. female who was brought to [place] via the police under an IVC order that was completed by her son. Pt shares she believes her son completed the order because he found out she had spent the day with her dad, her brother, her mom, and [name]. Clinician inquired as to whether pt's family members were still living, and pt did not answer the questions but instead shared that her father is God, her brother is Jesus, she is the [employer], her mother is Hedda Slade, and [name] is her 2nd father. Clinician inquired as to whether, if her son had happened upon the group, he would be able to see the members of the group, and pt stated that her son would be that she cannot. When clinician questioned pt to ensure she understood correctly, pt verified that this was, in fact the information and that, while her son would be able to see the members of the group, she is not able to see them. Clinician expressed that this must make it difficult to interact with/get along with the members of her family, and pt agreed that it was at times.  Pt denies SI, a current plan, or any previous attempts to kill herself. She states he has been previously hospitalized in the past but that she's never attempeted to harm herself. Pt denies HI, engagement in the legal system, or SA. Pt states she hears family members who have moved into her home and whom want her to move out; she states it's two sisters and one of the sister's kids. Pt shares she does not engage in NSSIS, though she has picked at the sores on her arms. Pt states she no longer has  any guns in the home, as she gave those to the sheriff's department, to her son, daughter, and to her brother.   Evaluation on Unit: Reviewed TTS assessment and validated with patient. On evaluation patient is alert and oriented x 4, pleasant, and cooperative. Speech is clear and coherent. Mood is depressed and affect is blunt. Patient states that she was out with Hermina Staggers and some family members all day and when she returned home her son was angry with her for not letting him know where she was. States that she had forgotten to take her phone with her. Reports that she turned her guns into the Sheriff's office today. Denies suicidal ideations. Denies homicidal ideations. Denies substance abuse. Denies audiovisual hallucinations. No indication that patient is responding to internal stimuli. Patient is delusional, but her thought process appears clearer, and she is much more pleasant than when this writer saw her last week.    Associated Signs/Symptoms: Depression Symptoms:  depressed mood, anxiety, (Hypo) Manic Symptoms:  Irritable Mood, Anxiety Symptoms:  general anxiety Psychotic Symptoms:  Delusions, PTSD Symptoms: Negative Total Time spent with patient: 30 minutes  Past Psychiatric History: Patient has a  history of a schizophrenic condition thought to be paranoid type. Further history reveals that the patient had decompensated fairly recently and has been suffering from hallucinations both auditory and visual and making delusional statements. Patient was discharged from [facility] on 08/28/18. Previously was treated in January 2019 with a  similar presentation.  At that point in time stabilized with oral aripiprazole  Is the patient at risk to self? No.  Has the patient been a risk to self in the past 6 months? No.  Has the patient been a risk to self within the distant past? No.  Is the patient a risk to others? No.  Has the patient been a risk to others in the past 6 months? No.  Has the  patient been a risk to others within the distant past? No.   Prior Inpatient Therapy: Prior Inpatient Therapy: Yes Prior Therapy Dates: 2019, 2020 Prior Therapy Facilty/Provider(s): [facility] Reason for Treatment: MH Prior Outpatient Therapy: Prior Outpatient Therapy: Yes Prior Therapy Dates: Unknown Prior Therapy Facilty/Provider(s): [facility] Reason for Treatment: MH Does patient have an ACCT team?: No Does patient have Intensive In-House Services?  : No Does patient have [facility] services? : No Does patient have P4CC services?: No  Alcohol Screening: 1. How often do you have a drink containing alcohol?: Never 2. How many drinks containing alcohol do you have on a typical day when you are drinking?: 1 or 2 3. How often do you have six or more drinks on one occasion?: Never AUDIT-C Score: 0 9. Have you or someone else been injured as a result of your drinking?: No 10. Has a relative or friend or a doctor or another health worker been concerned about your drinking or suggested you cut down?: No Alcohol Use Disorder Identification Test Final Score (AUDIT): 0 Substance Abuse History in the last 12 months:  No. Consequences of Substance Abuse: NA Previous Psychotropic Medications: Yes  Psychological Evaluations: Yes  Past Medical History:  Past Medical History:  Diagnosis Date  . Anxiety   . Asthma   . Chronic kidney disease   . Chronic pain    previously saw Dr. Consuela Mimes in pain clinic, then saw pain specialist in [place]  . Depression   . Diabetes mellitus ([place])   . Frequency of urination   . GERD (gastroesophageal reflux disease)   . Headache(784.0)   . High cholesterol   . Hypertension   . IBS (irritable bowel syndrome)   . Left ankle instability   . Left knee DJD   . Lumbar Degenerative Disc Disease of  10/11/2014  . Neuromuscular disorder ([place])   . Osteoarthritis of hip (Right) 05/05/2015  . Other enthesopathy of ankle  and tarsus 12/15/2009   Qualifier: Diagnosis of  By: Oneida Alar MD, KARL    . Parkinson's disease ([place])   . Peripheral sensory neuropathy (Bilateral) 11/19/2014  . Postoperative nausea and vomiting   . Schizophrenia [facility])     Past Surgical History:  Procedure Laterality Date  . ABDOMINAL HYSTERECTOMY    . ANKLE SURGERY    . APPENDECTOMY    . COLONOSCOPY  2013  . COLONOSCOPY WITH PROPOFOL N/A 04/25/2017   Procedure: COLONOSCOPY WITH PROPOFOL;  Surgeon: Manya Silvas, MD;  Location: [facility] ENDOSCOPY;  Service: Endoscopy;  Laterality: N/A;  . ESOPHAGOGASTRODUODENOSCOPY (EGD) WITH PROPOFOL N/A 10/03/2014   Procedure: ESOPHAGOGASTRODUODENOSCOPY (EGD) WITH PROPOFOL;  Surgeon: Josefine Class, MD;  Location: [facility] ENDOSCOPY;  Service: Endoscopy;  Laterality: N/A;  . ESOPHAGOGASTRODUODENOSCOPY (EGD) WITH PROPOFOL  04/25/2017   Procedure: ESOPHAGOGASTRODUODENOSCOPY (EGD) WITH PROPOFOL;  Surgeon: Manya Silvas, MD;  Location: [facility] ENDOSCOPY;  Service: Endoscopy;;  . KNEE ARTHROSCOPY  1997   left knee  . LEFT HEART CATH AND CORONARY ANGIOGRAPHY Left 11/10/2017   Procedure:  LEFT HEART CATH AND CORONARY ANGIOGRAPHY;  Surgeon: Isaias Cowman, MD;  Location: [place] CV LAB;  Service: Cardiovascular;  Laterality: Left;  . LEFT HEART CATHETERIZATION WITH CORONARY ANGIOGRAM N/A 01/11/2013   Procedure: LEFT HEART CATHETERIZATION WITH CORONARY ANGIOGRAM;  Surgeon: Sinclair Grooms, MD;  [facility];  Service: Cardiovascular;  Laterality: N/A;  . TOTAL KNEE ARTHROPLASTY  08/30/2011   Procedure: TOTAL KNEE ARTHROPLASTY;  Surgeon: Lorn Junes, MD;  Location: [place];  Service: Orthopedics;  Laterality: Left;   Family History:  Family History  Problem Relation Age of Onset  . Cancer Mother   . Hypertension Father   . Heart disease Father   . Alcohol abuse Father    Family Psychiatric History: no pertinent history Tobacco Screening: Have you used any form of tobacco in the last 30  days? (Cigarettes, Smokeless Tobacco, Cigars, and/or Pipes): No Social History:  Social History   Substance and Sexual Activity  Alcohol Use No  . Alcohol/week: 0.0 standard drinks     Social History   Substance and Sexual Activity  Drug Use No    Additional Social History: Marital status: Divorced    Pain Medications: Please see MAR Prescriptions: Please see MAR Over the Counter: Please see MAR History of alcohol / drug use?: No history of alcohol / drug abuse Longest period of sobriety (when/how long): Pt denies SA                    Allergies:   Allergies  Allergen Reactions  . Prolixin Decanoate [Fluphenazine]   . Benztropine Other (See Comments)    Hair fall out  Suicide thoughts  . Buprenorphine Hcl Other (See Comments)    Unable to void  . Carbidopa-Levodopa Nausea Only  . Codeine Hives    REACTION: hives  . Duloxetine Other (See Comments)    Altered mental status Alopecia, visual hallucinations, nightmares  . Gabapentin Swelling  . Lyrica [Pregabalin] Other (See Comments)    Alopecia, visual hallucinations, nightmares Altered mental status  . Morphine And Related Other (See Comments)    Unable to void  . Nortripytline Hcl [Nortriptyline] Other (See Comments)    Hair loss and night mares   . Nsaids     REACTION: palpitations, diaphoresis  . Penicillins Nausea And Vomiting and Other (See Comments)    REACTION: upset stomach Has patient had a PCN reaction causing immediate rash, facial/tongue/throat swelling, SOB or lightheadedness with hypotension: No Has patient had a PCN reaction causing severe rash involving mucus membranes or skin necrosis: No Has patient had a PCN reaction that required hospitalization: No Has patient had a PCN reaction occurring within the last 10 years: No If all of the above answers are "NO", then may proceed with Cephalosporin use.  . Tolmetin Other (See Comments)    REACTION: palpitations, diaphoresis  . Wellbutrin  [Bupropion]     Constipation, mood swings   Lab Results:  Results for orders placed or performed during the hospital encounter of 08/30/18 (from the past 48 hour(s))  SARS Coronavirus 2 (CEPHEID - Performed in [place] hospital lab), Hosp Order     Status: None   Collection Time: 08/30/18  2:56 AM   Specimen: Nasopharyngeal Swab  Result Value Ref Range   SARS Coronavirus 2 NEGATIVE NEGATIVE    Comment: (NOTE) If result is NEGATIVE SARS-CoV-2 target nucleic acids are NOT DETECTED. The SARS-CoV-2 RNA is generally detectable in upper and lower  respiratory specimens during the  acute phase of infection. The lowest  concentration of SARS-CoV-2 viral copies this assay can detect is 250  copies / mL. A negative result does not preclude SARS-CoV-2 infection  and should not be used as the sole basis for treatment or other  patient management decisions.  A negative result may occur with  improper specimen collection / handling, submission of specimen other  than nasopharyngeal swab, presence of viral mutation(s) within the  areas targeted by this assay, and inadequate number of viral copies  (<250 copies / mL). A negative result must be combined with clinical  observations, patient history, and epidemiological information. If result is POSITIVE SARS-CoV-2 target nucleic acids are DETECTED. The SARS-CoV-2 RNA is generally detectable in upper and lower  respiratory specimens dur ing the acute phase of infection.  Positive  results are indicative of active infection with SARS-CoV-2.  Clinical  correlation with patient history and other diagnostic information is  necessary to determine patient infection status.  Positive results do  not rule out bacterial infection or co-infection with other viruses. If result is PRESUMPTIVE POSTIVE SARS-CoV-2 nucleic acids MAY BE PRESENT.   A presumptive positive result was obtained on the submitted specimen  and confirmed on repeat testing.  While 2019  novel coronavirus  (SARS-CoV-2) nucleic acids may be present in the submitted sample  additional confirmatory testing may be necessary for epidemiological  and / or clinical management purposes  to differentiate between  SARS-CoV-2 and other Sarbecovirus currently known to infect humans.  If clinically indicated additional testing with an alternate test  methodology [id number]) is advised. The SARS-CoV-2 RNA is generally  detectable in upper and lower respiratory sp ecimens during the acute  phase of infection. The expected result is Negative. Fact Sheet for Patients:  StrictlyIdeas.no Fact Sheet for Healthcare Providers: BankingDealers.co.za This test is not yet approved or cleared by the [place] FDA and has been authorized for detection and/or diagnosis of SARS-CoV-2 by FDA under an Emergency Use Authorization (EUA).  This EUA will remain in effect (meaning this test can be used) for the duration of the COVID-19 declaration under Section 564(b)(1) of the Act, 21 U.S.C. section 360bbb-3(b)(1), unless the authorization is terminated or revoked sooner. Performed at [facility], [place] [address], [place], [place] [address]     Blood Alcohol level:  Lab Results  Component Value Date   ETH <10 08/21/2018   ETH <10 82/99/3716    Metabolic Disorder Labs:  Lab Results  Component Value Date   HGBA1C 6.2 (H) 03/06/2017   MPG 131.24 03/06/2017   MPG 108 05/17/2015   Lab Results  Component Value Date   PROLACTIN 64.2 (H) 03/06/2017   Lab Results  Component Value Date   CHOL 146 08/22/2018   TRIG 172 (H) 08/22/2018   HDL 49 08/22/2018   CHOLHDL 3.0 08/22/2018   VLDL 34 08/22/2018   LDLCALC 63 08/22/2018   LDLCALC 59 03/06/2017    Current Medications: Current Facility-Administered Medications  Medication Dose Route Frequency Provider Last Rate Last Dose  . acetaminophen (TYLENOL) tablet 650 mg  650 mg  Oral Q6H PRN Lindon Romp A, NP      . albuterol (VENTOLIN HFA) 108 (90 Base) MCG/ACT inhaler 2 puff  2 puff Inhalation Q6H PRN Lindon Romp A, NP      . alum & mag hydroxide-simeth (MAALOX/MYLANTA) 200-200-20 MG/5ML suspension 30 mL  30 mL Oral Q4H PRN Rozetta Nunnery, NP      . amantadine (SYMMETREL)  capsule 100 mg  100 mg Oral BID Lindon Romp A, NP      . aspirin EC tablet 81 mg  81 mg Oral Daily Lindon Romp A, NP      . carbidopa-levodopa (SINEMET IR) 25-100 MG per tablet immediate release 1 tablet  1 tablet Oral TID Rozetta Nunnery, NP      . cholecalciferol (VITAMIN D3) tablet 1,000 Units  1,000 Units Oral Daily Lindon Romp A, NP      . docusate sodium (COLACE) capsule 100 mg  100 mg Oral BID Lindon Romp A, NP      . famotidine (PEPCID) tablet 20 mg  20 mg Oral Daily Lindon Romp A, NP      . furosemide (LASIX) tablet 20 mg  20 mg Oral Daily Lindon Romp A, NP      . glimepiride (AMARYL) tablet 1 mg  1 mg Oral Q breakfast Lindon Romp A, NP      . loratadine (CLARITIN) tablet 10 mg  10 mg Oral Daily Lindon Romp A, NP      . losartan (COZAAR) tablet 25 mg  25 mg Oral Daily Lindon Romp A, NP      . magnesium hydroxide (MILK OF MAGNESIA) suspension 30 mL  30 mL Oral Daily PRN Lindon Romp A, NP      . metFORMIN (GLUCOPHAGE) tablet 500 mg  500 mg Oral BID WC Lindon Romp A, NP      . mirtazapine (REMERON SOL-TAB) disintegrating tablet 15 mg  15 mg Oral QHS Lindon Romp A, NP      . pantoprazole (PROTONIX) EC tablet 40 mg  40 mg Oral Daily Lindon Romp A, NP      . polyvinyl alcohol (LIQUIFILM TEARS) 1.4 % ophthalmic solution 1 drop  1 drop Both Eyes Q4H PRN Lindon Romp A, NP      . pravastatin (PRAVACHOL) tablet 40 mg  40 mg Oral QHS Lindon Romp A, NP      . risperiDONE (RISPERDAL) tablet 6 mg  6 mg Oral QHS Lindon Romp A, NP      . rOPINIRole (REQUIP) tablet 0.5 mg  0.5 mg Oral BID Lindon Romp A, NP      . senna (SENOKOT) tablet 17.2 mg  2 tablet Oral Daily PRN Lindon Romp A, NP       . topiramate (TOPAMAX) tablet 50 mg  50 mg Oral QHS Lindon Romp A, NP      . verapamil (CALAN-SR) CR tablet 120 mg  120 mg Oral QHS Lindon Romp A, NP      . vitamin B-12 (CYANOCOBALAMIN) tablet 1,000 mcg  1,000 mcg Oral Daily Lindon Romp A, NP       PTA Medications: Medications Prior to Admission  Medication Sig Dispense Refill Last Dose  . albuterol (PROVENTIL HFA;VENTOLIN HFA) 108 (90 Base) MCG/ACT inhaler Inhale 2 puffs into the lungs every 6 (six) hours as needed for wheezing or shortness of breath.     Marland Kitchen amantadine (SYMMETREL) 100 MG capsule Take 1 capsule (100 mg total) by mouth 2 (two) times daily. 60 capsule 1   . aspirin 81 MG tablet Take 81 mg by mouth daily.      . carbidopa-levodopa (SINEMET IR) 25-100 MG tablet Take 1 tablet by mouth 3 (three) times daily.     . cetirizine (ZYRTEC) 10 MG tablet Take 10 mg by mouth daily.     . cholecalciferol (VITAMIN D) 1000 units tablet Take 1,000 Units by mouth daily.     Marland Kitchen  cyclobenzaprine (FLEXERIL) 10 MG tablet Take 10 mg by mouth 2 (two) times daily.      . diphenhydrAMINE (BENADRYL ALLERGY) 25 mg capsule Take 1 capsule (25 mg total) by mouth daily as needed. For side effects of tremors due to zyprexa 30 capsule 2   . docusate sodium (COLACE) 100 MG capsule Take 100 mg by mouth 2 (two) times daily.      . furosemide (LASIX) 20 MG tablet Take 20 mg by mouth daily.      Marland Kitchen glimepiride (AMARYL) 1 MG tablet Take 1 mg by mouth daily with breakfast.      . hydroxypropyl methylcellulose / hypromellose (ISOPTO TEARS / GONIOVISC) 2.5 % ophthalmic solution Place 1 drop into both eyes every 4 (four) hours as needed for dry eyes.     Marland Kitchen losartan (COZAAR) 25 MG tablet Take 25 mg by mouth daily.     . Melatonin 3 MG TABS Take 1 tablet (3 mg total) by mouth at bedtime. TAKE IT 90 MINUTES PRIOR TO BEDTIME FOR SLEEP 100 tablet 0   . metFORMIN (GLUCOPHAGE) 500 MG tablet Take 1 tablet (500 mg total) by mouth 2 (two) times daily with a meal. For diabetes  management 10 tablet 0   . mirtazapine (REMERON SOL-TAB) 15 MG disintegrating tablet Take 1 tablet (15 mg total) by mouth at bedtime. 30 tablet 2   . omeprazole (PRILOSEC) 40 MG capsule Take 40 mg by mouth daily as needed (heartburn).      . ondansetron (ZOFRAN) 4 MG tablet Take 4 mg by mouth every 8 (eight) hours as needed for nausea or vomiting.     . pravastatin (PRAVACHOL) 40 MG tablet Take 40 mg by mouth at bedtime.      . ranitidine (ZANTAC) 150 MG tablet Take 150 mg by mouth daily as needed for heartburn.      . risperiDONE (RISPERDAL) 3 MG tablet Take 2 tablets (6 mg total) by mouth at bedtime. 60 tablet 2   . rizatriptan (MAXALT) 10 MG tablet Take 10 mg by mouth 2 (two) times daily. May repeat in 2 hours if needed      . rOPINIRole (REQUIP) 0.5 MG tablet Take 1 tablet (0.5 mg total) by mouth 2 (two) times a day. 60 tablet 1   . senna (SENOKOT) 8.6 MG tablet Take 1-4 tablets (8.6-34.4 mg total) by mouth daily as needed for constipation. (Patient taking differently: Take 1 tablet by mouth at bedtime. )     . topiramate (TOPAMAX) 100 MG tablet Take 0.5 tablets (50 mg total) by mouth at bedtime. For mood control 30 tablet 1   . traMADol (ULTRAM) 50 MG tablet Take 2 tablets (100 mg total) by mouth 2 (two) times daily for 15 days. 30 tablet 1   . verapamil (CALAN-SR) 120 MG CR tablet Take 1 tablet (120 mg total) by mouth at bedtime. For high blood pressure 90 tablet 1   . vitamin B-12 (CYANOCOBALAMIN) 1000 MCG tablet Take 1,000 mcg by mouth daily.       Musculoskeletal: Strength & Muscle Tone: within normal limits Gait & Station: normal Patient leans: N/A  Psychiatric Specialty Exam: Physical Exam  Constitutional: She is oriented to person, place, and time. She appears well-developed and well-nourished. No distress.  HENT:  Head: Normocephalic and atraumatic.  Right Ear: External ear normal.  Left Ear: External ear normal.  Eyes: Pupils are equal, round, and reactive to light. Right  eye exhibits no discharge. Left eye exhibits no discharge.  Respiratory: Effort normal. No respiratory distress.  Musculoskeletal: Normal range of motion.  Neurological: She is alert and oriented to person, place, and time.  Skin: She is not diaphoretic.    Review of Systems  Constitutional: Negative for chills, diaphoresis, fever, malaise/fatigue and weight loss.  Respiratory: Negative for cough and shortness of breath.   Cardiovascular: Negative for chest pain.  Gastrointestinal: Negative for diarrhea, nausea and vomiting.  Psychiatric/Behavioral: Positive for depression. Negative for hallucinations, memory loss, substance abuse and suicidal ideas. The patient is nervous/anxious and has insomnia.     Blood pressure (!) 129/97, pulse 99, temperature 98 F (36.7 C), temperature source Oral, resp. rate 18, SpO2 98 %.There is no height or weight on file to calculate BMI.  General Appearance: Casual and Fairly Groomed  Eye Contact:  Good  Speech:  Clear and Coherent and Normal Rate  Volume:  Normal  Mood:  Anxious and Depressed  Affect:  Blunt  Thought Process:  Coherent and Linear  Orientation:  Full (Time, Place, and Person)  Thought Content:  Delusions  Suicidal Thoughts:  No  Homicidal Thoughts:  No  Memory:  Immediate;   Fair Recent;   Fair  Judgement:  Intact  Insight:  Lacking  Psychomotor Activity:  Normal  Concentration:  Concentration: Fair and Attention Span: Fair  Recall:  Good  Fund of Knowledge:  Good  Language:  Good  Akathisia:  Negative  Handed:  Right  AIMS (if indicated):     Assets:  Agricultural consultant Housing Leisure Time  ADL's:  Intact  Cognition:  WNL  Sleep:         Treatment Plan Summary: Daily contact with patient to assess and evaluate symptoms and progress in treatment and Medication management  Observation Level/Precautions:  15 minute checks Laboratory:  Deferred Psychotherapy:  Individual  Medications:   Resumed home medications. See above. Consultations:  Social work Discharge Concerns:   Estimated LOS:<24 hours Other:      Rozetta Nunnery, NP 7/22/20205:11 AM

## 2018-08-30 NOTE — Progress Notes (Signed)
Patient ID: Heather Haley, female   DOB: 1961/01/12, 58 y.o.   MRN: [id number] Patient discharged, educated on her discharge instructions and verbalized understanding. Pt's daughter was called and educated of pt's discharge, and became beregerent with this RN, demanded to speak with a supervisor, was cussing at this RN using profanity, stating: "You'all are not doing your fucking jobs", and was transferred to the nursing supervisor.  She then called back and was transferred to speak with the Provider, Ms Rankin.   After approximately 30 minutes, this RN was notified that pt's family was out the building to take her home. This  RN and an MHT walked the pt to a female family mber's waiting car. Pt left hospital with all of her discharge instructions and personal belongings.

## 2018-08-30 NOTE — Progress Notes (Signed)
Patient ID: Heather Haley, female   DOB: December 01, 1960, 58 y.o.   MRN: [id number]   Heather Haley daughter Heather Haley called multiple times with complaints about her mother being released home.  Heather Haley states that her mother took her gun to the [facility] department and gave it to them.  States that patient was gone for 16 hours and did not know where she was.  Also states that patient is delusional about [name] and an golden angle and why were we discharging her if she is being treated with Corona virus.  Informed Heather Haley that patient was just discharged on 08/28/18 where she was started on medication and was to follow up with [facility].  Informed that patient was assess this morning and she was alert/oriented, there had been no incidents during patient stay.  Patient continue to have some delusional thoughts but it is chronic and it is not a danger to herself or other.  Informed Heather Haley if there was concern about patient living alone patient needed to help or see if could get patient to a family care home and to take the keys away if did not want the patient to drive.  Informed that we can't prevent the patient from driving that would have to be the family that took care of that.  Also informed that patient did inform us that she took her gun to the [facility] and that she took it there because she had been suicidal in the past and just as a precaution she did not want the gun in the house which was a responsible action on patients part.  Caesar Bookman that we could keep her mother overnight and reassess her again tomorrow just to assure her.  Also informed Heather Haley that I could have Education officer, museum to speak with patient about other living arrangements but patients family would need to look for a place, take away the keys, help patient to get medications refilled if patient not doing it.  Coming to the hospital is to be stabilized and then follow up with  outpatient provider to continue stability and medication management.  Heather Haley yelling cursing "That's why people go to a church and shoot up all of the damn people cause no damn body will fucking help them when they are asking for help.  Get her ready I'll come pick her the hell up.and when it happens I'll sue ya'lls ass."  Heather Haley then hung up the phone.     B. , NP

## 2018-08-30 NOTE — Consult Note (Addendum)
[facility] Psych Observation Discharge  08/30/2018 1:44 PM Heather Haley  MRN:  [id number] Principal Problem: Schizophrenia [facility]) Discharge Diagnoses: Principal Problem:   Schizophrenia [facility]) Active Problems:   Involuntary commitment   Subjective: Heather Haley, 58 y.o., female patient seen via tele psych by this provider, Dr. Mariea Clonts; and chart reviewed on 08/30/18.  On evaluation Heather Haley reports she left home yesterday and her son did not know where she was and got mad upon her return; "When he gets up set he just blows".  Patient denies suicidal/self-harm/homicidal ideation, psychosis, and paranoia.  Patient does state that she is living with family and doesn't want them there.  This is a fixed delusion of patient that is chronic (lives alone); which is not causing harm to patient or other.  Patient has outpatient psychiatric services and is compliant with medications.  Patient denies illicit drug use and alcohol use.  Patient informed that she took her gun to the [facility] office because of her past history of suicidal ideation when she is upset she did not want the gun in the home.     During evaluation Heather Haley is alert/oriented x 4; calm/cooperative; and mood is congruent with affect.  She does not appear to be responding to internal/external stimuli.  Patient continues to have delusional thoughts of her family which are chronic and non harmful  Patient denies suicidal/self-harm/homicidal ideation, psychosis, and paranoia.  Patient answered question appropriately.     Total Time spent with patient: 30 minutes  Past Psychiatric History: Per H&P: Patient has a  history of a schizophrenic condition thought to be paranoid type. Further history reveals that the patient had decompensated fairly recently and has been suffering from hallucinations both auditory and visual and making delusional statements. Patient was discharged from [facility] on 08/28/18. Previously was treated in  January 2019 with a similar presentation. At that point in time stabilized with oral aripiprazole.  Past Medical History:  Past Medical History:  Diagnosis Date  . Anxiety   . Asthma   . Chronic kidney disease   . Chronic pain    previously saw Dr. Consuela Mimes in pain clinic, then saw pain specialist in [place]  . Depression   . Diabetes mellitus ([place])   . Frequency of urination   . GERD (gastroesophageal reflux disease)   . Headache(784.0)   . High cholesterol   . Hypertension   . IBS (irritable bowel syndrome)   . Left ankle instability   . Left knee DJD   . Lumbar Degenerative Disc Disease of  10/11/2014  . Neuromuscular disorder ([place])   . Osteoarthritis of hip (Right) 05/05/2015  . Other enthesopathy of ankle and tarsus 12/15/2009   Qualifier: Diagnosis of  By: Oneida Alar MD, KARL    . Parkinson's disease ([place])   . Peripheral sensory neuropathy (Bilateral) 11/19/2014  . Postoperative nausea and vomiting   . Schizophrenia [facility])     Past Surgical History:  Procedure Laterality Date  . ABDOMINAL HYSTERECTOMY    . ANKLE SURGERY    . APPENDECTOMY    . COLONOSCOPY  2013  . COLONOSCOPY WITH PROPOFOL N/A 04/25/2017   Procedure: COLONOSCOPY WITH PROPOFOL;  Surgeon: Manya Silvas, MD;  Location: [facility] ENDOSCOPY;  Service: Endoscopy;  Laterality: N/A;  . ESOPHAGOGASTRODUODENOSCOPY (EGD) WITH PROPOFOL N/A 10/03/2014   Procedure: ESOPHAGOGASTRODUODENOSCOPY (EGD) WITH PROPOFOL;  Surgeon: Josefine Class, MD;  Location: [facility] ENDOSCOPY;  Service: Endoscopy;  Laterality: N/A;  . ESOPHAGOGASTRODUODENOSCOPY (EGD) WITH PROPOFOL  04/25/2017  Procedure: ESOPHAGOGASTRODUODENOSCOPY (EGD) WITH PROPOFOL;  Surgeon: Manya Silvas, MD;  Location: [facility] ENDOSCOPY;  Service: Endoscopy;;  . KNEE ARTHROSCOPY  1997   left knee  . LEFT HEART CATH AND CORONARY ANGIOGRAPHY Left 11/10/2017   Procedure: LEFT HEART CATH AND CORONARY ANGIOGRAPHY;  Surgeon: Isaias Cowman, MD;  Location: [place] CV  LAB;  Service: Cardiovascular;  Laterality: Left;  . LEFT HEART CATHETERIZATION WITH CORONARY ANGIOGRAM N/A 01/11/2013   Procedure: LEFT HEART CATHETERIZATION WITH CORONARY ANGIOGRAM;  Surgeon: Sinclair Grooms, MD;  Location: [facility] CATH LAB;  Service: Cardiovascular;  Laterality: N/A;  . TOTAL KNEE ARTHROPLASTY  08/30/2011   Procedure: TOTAL KNEE ARTHROPLASTY;  Surgeon: Lorn Junes, MD;  Location: [place];  Service: Orthopedics;  Laterality: Left;   Family History:  Family History  Problem Relation Age of Onset  . Cancer Mother   . Hypertension Father   . Heart disease Father   . Alcohol abuse Father    Family Psychiatric  History: See above list Social History:  Social History   Substance and Sexual Activity  Alcohol Use No  . Alcohol/week: 0.0 standard drinks     Social History   Substance and Sexual Activity  Drug Use No    Social History   Socioeconomic History  . Marital status: Divorced    Spouse name: Not on file  . Number of children: 2  . Years of education: 30  . Highest education level: 11th grade  Occupational History    Employer: [employer]  . Financial resource strain: Somewhat hard  . Food insecurity    Worry: Never true    Inability: Never true  . Transportation needs    Medical: No    Non-medical: No  Tobacco Use  . Smoking status: Former Smoker    Packs/day: 2.00    Years: 15.00    Pack years: 30.00    Types: Cigarettes    Quit date: 02/09/1995    Years since quitting: 23.5  . Smokeless tobacco: Never Used  Substance and Sexual Activity  . Alcohol use: No    Alcohol/week: 0.0 standard drinks  . Drug use: No  . Sexual activity: Not Currently  Lifestyle  . Physical activity    Days per week: 0 days    Minutes per session: 0 min  . Stress: Rather much  Relationships  . Social connections    Talks on phone: More than three times a week    Gets together: Once a week    Attends religious service: Never    Active member  of club or organization: No    Attends meetings of clubs or organizations: Never    Relationship status: Divorced  Other Topics Concern  . Not on file  Social History Narrative   Patient lives at home alone. Patient works at [employer].   Caffeine daily- 2   Right handed.   Education- 11 th grade    Has this patient used any form of tobacco in the last 30 days? (Cigarettes, Smokeless Tobacco, Cigars, and/or Pipes) Prescription not provided because: Denies tobacco use  Current Medications: Current Facility-Administered Medications  Medication Dose Route Frequency Provider Last Rate Last Dose  . acetaminophen (TYLENOL) tablet 650 mg  650 mg Oral Q6H PRN Lindon Romp A, NP      . albuterol (VENTOLIN HFA) 108 (90 Base) MCG/ACT inhaler 2 puff  2 puff Inhalation Q6H PRN Rozetta Nunnery, NP      .  alum & mag hydroxide-simeth (MAALOX/MYLANTA) 200-200-20 MG/5ML suspension 30 mL  30 mL Oral Q4H PRN Lindon Romp A, NP      . amantadine (SYMMETREL) capsule 100 mg  100 mg Oral BID Lindon Romp A, NP   100 mg at 08/30/18 1053  . aspirin EC tablet 81 mg  81 mg Oral Daily Lindon Romp A, NP   81 mg at 08/30/18 0859  . carbidopa-levodopa (SINEMET IR) 25-100 MG per tablet immediate release 1 tablet  1 tablet Oral TID Rozetta Nunnery, NP   1 tablet at 08/30/18 1053  . cholecalciferol (VITAMIN D3) tablet 1,000 Units  1,000 Units Oral Daily Lindon Romp A, NP   1,000 Units at 08/30/18 1056  . docusate sodium (COLACE) capsule 100 mg  100 mg Oral BID Lindon Romp A, NP   100 mg at 08/30/18 0859  . famotidine (PEPCID) tablet 20 mg  20 mg Oral Daily Lindon Romp A, NP   20 mg at 08/30/18 0858  . furosemide (LASIX) tablet 20 mg  20 mg Oral Daily Lindon Romp A, NP   20 mg at 08/30/18 0859  . glimepiride (AMARYL) tablet 1 mg  1 mg Oral Q breakfast Lindon Romp A, NP   1 mg at 08/30/18 1055  . loratadine (CLARITIN) tablet 10 mg  10 mg Oral Daily Lindon Romp A, NP   10 mg at 08/30/18 0859  . losartan (COZAAR)  tablet 25 mg  25 mg Oral Daily Lindon Romp A, NP   25 mg at 08/30/18 1054  . magnesium hydroxide (MILK OF MAGNESIA) suspension 30 mL  30 mL Oral Daily PRN Lindon Romp A, NP      . metFORMIN (GLUCOPHAGE) tablet 500 mg  500 mg Oral BID WC Lindon Romp A, NP   500 mg at 08/30/18 0859  . mirtazapine (REMERON SOL-TAB) disintegrating tablet 15 mg  15 mg Oral QHS Lindon Romp A, NP      . pantoprazole (PROTONIX) EC tablet 40 mg  40 mg Oral Daily Lindon Romp A, NP      . polyvinyl alcohol (LIQUIFILM TEARS) 1.4 % ophthalmic solution 1 drop  1 drop Both Eyes Q4H PRN Lindon Romp A, NP      . pravastatin (PRAVACHOL) tablet 40 mg  40 mg Oral QHS Lindon Romp A, NP      . risperiDONE (RISPERDAL) tablet 6 mg  6 mg Oral QHS Lindon Romp A, NP      . rOPINIRole (REQUIP) tablet 0.5 mg  0.5 mg Oral BID Lindon Romp A, NP   0.5 mg at 08/30/18 1053  . senna (SENOKOT) tablet 17.2 mg  2 tablet Oral Daily PRN Lindon Romp A, NP      . topiramate (TOPAMAX) tablet 50 mg  50 mg Oral QHS Lindon Romp A, NP      . verapamil (CALAN-SR) CR tablet 120 mg  120 mg Oral QHS Lindon Romp A, NP      . vitamin B-12 (CYANOCOBALAMIN) tablet 1,000 mcg  1,000 mcg Oral Daily Lindon Romp A, NP   1,000 mcg at 08/30/18 1054   PTA Medications: Medications Prior to Admission  Medication Sig Dispense Refill Last Dose  . amantadine (SYMMETREL) 100 MG capsule Take 1 capsule (100 mg total) by mouth 2 (two) times daily. 60 capsule 1 Past Week at Unknown time  . albuterol (PROVENTIL HFA;VENTOLIN HFA) 108 (90 Base) MCG/ACT inhaler Inhale 2 puffs into the lungs every 6 (six) hours as needed for wheezing or shortness  of breath.     Marland Kitchen aspirin 81 MG tablet Take 81 mg by mouth daily.      . carbidopa-levodopa (SINEMET IR) 25-100 MG tablet Take 1 tablet by mouth 3 (three) times daily.     . cetirizine (ZYRTEC) 10 MG tablet Take 10 mg by mouth daily.     . cholecalciferol (VITAMIN D) 1000 units tablet Take 1,000 Units by mouth daily.     .  cyclobenzaprine (FLEXERIL) 10 MG tablet Take 10 mg by mouth 2 (two) times daily.      . diphenhydrAMINE (BENADRYL ALLERGY) 25 mg capsule Take 1 capsule (25 mg total) by mouth daily as needed. For side effects of tremors due to zyprexa 30 capsule 2   . docusate sodium (COLACE) 100 MG capsule Take 100 mg by mouth 2 (two) times daily.      Marland Kitchen glimepiride (AMARYL) 1 MG tablet Take 1 mg by mouth daily with breakfast.      . hydroxypropyl methylcellulose / hypromellose (ISOPTO TEARS / GONIOVISC) 2.5 % ophthalmic solution Place 1 drop into both eyes every 4 (four) hours as needed for dry eyes.     Marland Kitchen losartan (COZAAR) 25 MG tablet Take 25 mg by mouth daily.     . metFORMIN (GLUCOPHAGE) 500 MG tablet Take 1 tablet (500 mg total) by mouth 2 (two) times daily with a meal. For diabetes management 10 tablet 0   . omeprazole (PRILOSEC) 40 MG capsule Take 40 mg by mouth daily as needed (heartburn).      . ondansetron (ZOFRAN) 4 MG tablet Take 4 mg by mouth every 8 (eight) hours as needed for nausea or vomiting.     . pravastatin (PRAVACHOL) 40 MG tablet Take 40 mg by mouth at bedtime.      . risperiDONE (RISPERDAL) 3 MG tablet Take 2 tablets (6 mg total) by mouth at bedtime. 60 tablet 2   . rizatriptan (MAXALT) 10 MG tablet Take 10 mg by mouth 2 (two) times daily. May repeat in 2 hours if needed      . senna (SENOKOT) 8.6 MG tablet Take 1-4 tablets (8.6-34.4 mg total) by mouth daily as needed for constipation. (Patient taking differently: Take 1 tablet by mouth at bedtime. )     . topiramate (TOPAMAX) 100 MG tablet Take 0.5 tablets (50 mg total) by mouth at bedtime. For mood control 30 tablet 1   . traMADol (ULTRAM) 50 MG tablet Take 2 tablets (100 mg total) by mouth 2 (two) times daily for 15 days. 30 tablet 1   . verapamil (CALAN-SR) 120 MG CR tablet Take 1 tablet (120 mg total) by mouth at bedtime. For high blood pressure 90 tablet 1   . vitamin B-12 (CYANOCOBALAMIN) 1000 MCG tablet Take 1,000 mcg by mouth daily.        Musculoskeletal: Strength & Muscle Tone: within normal limits Gait & Station: normal Patient leans: N/A  Psychiatric Specialty Exam: Physical Exam  Nursing note and vitals reviewed. Constitutional: She is oriented to person, place, and time. No distress.  Neck: Normal range of motion.  Respiratory: Effort normal.  Neurological: She is alert and oriented to person, place, and time.  Skin: Skin is warm and dry.  Psychiatric: She has a normal mood and affect. Her speech is normal and behavior is normal. Judgment and thought content normal. Cognition and memory are normal.    Review of Systems  Psychiatric/Behavioral: Negative for hallucinations, substance abuse and suicidal ideas. Depression: Stable. Nervous/anxious: Stable.  All other systems reviewed and are negative.   Blood pressure (!) 129/97, pulse 99, temperature 98 F (36.7 C), temperature source Oral, resp. rate 18, SpO2 98 %.There is no height or weight on file to calculate BMI.  General Appearance: Casual  Eye Contact:  Good  Speech:  Clear and Coherent and Normal Rate  Volume:  Normal  Mood:  Appropriate  Affect:  Appropriate and Congruent  Thought Process:  Coherent, Goal Directed and Descriptions of Associations: Intact  Orientation:  Full (Time, Place, and Person)  Thought Content:  Delusions of family; but not harmful to patient or others  Suicidal Thoughts:  No  Homicidal Thoughts:  No  Memory:  Immediate;   Good Recent;   Good Remote;   Good  Judgement:  Intact  Insight:  Present  Psychomotor Activity:  Normal  Concentration:  Concentration: Good and Attention Span: Good  Recall:  Good  Fund of Knowledge:  Fair  Language:  Good  Akathisia:  No  Handed:  Right  AIMS (if indicated):   N/A  Assets:  Communication Skills Desire for Improvement Housing Social Support  ADL's:  Intact  Cognition:  WNL  Sleep:   N/A     Demographic Factors:  Caucasian and Living alone  Loss  Factors: NA  Historical Factors: NA  Risk Reduction Factors:   Sense of responsibility to family, Positive social support and Positive therapeutic relationship  Continued Clinical Symptoms:  Previous Psychiatric Diagnoses and Treatments  Cognitive Features That Contribute To Risk:  None    Suicide Risk:  Minimal: No identifiable suicidal ideation.  Patients presenting with no risk factors but with morbid ruminations; may be classified as minimal risk based on the severity of the depressive symptoms    Plan Of Care/Follow-up recommendations:  Activity:  As tolerated Diet:  Heart healthy Other:  Follow up with current psychiatric provider   Disposition: No evidence of imminent risk to self or others at present.   Patient does not meet criteria for psychiatric inpatient admission. Supportive therapy provided about ongoing stressors. Discussed crisis plan, support from social network, calling 911, coming to the Emergency Department, and calling Suicide Hotline.  Shuvon Rankin, NP 08/30/2018, 1:44 PM   Patient seen face-to-face for psychiatric evaluation, chart reviewed and case discussed with the physician extender and developed treatment plan. Reviewed the information documented and agree with the treatment plan.  Buford Dresser, DO 08/30/18 6:54 PM

## 2018-08-31 ENCOUNTER — Other Ambulatory Visit (HOSPITAL_COMMUNITY): Payer: Medicare HMO

## 2018-08-31 ENCOUNTER — Ambulatory Visit (HOSPITAL_COMMUNITY): Payer: Medicare HMO

## 2018-09-01 ENCOUNTER — Other Ambulatory Visit (HOSPITAL_COMMUNITY): Payer: Medicare HMO

## 2018-09-01 ENCOUNTER — Ambulatory Visit (HOSPITAL_COMMUNITY): Payer: Medicare HMO

## 2018-09-04 ENCOUNTER — Other Ambulatory Visit (HOSPITAL_COMMUNITY): Payer: Medicare HMO

## 2018-09-04 DIAGNOSIS — J4540 Moderate persistent asthma, uncomplicated: Secondary | ICD-10-CM | POA: Diagnosis not present

## 2018-09-04 DIAGNOSIS — R0602 Shortness of breath: Secondary | ICD-10-CM | POA: Diagnosis not present

## 2018-09-04 DIAGNOSIS — E538 Deficiency of other specified B group vitamins: Secondary | ICD-10-CM | POA: Diagnosis not present

## 2018-09-05 ENCOUNTER — Ambulatory Visit (HOSPITAL_COMMUNITY): Payer: Medicare HMO | Admitting: Licensed Clinical Social Worker

## 2018-09-05 ENCOUNTER — Ambulatory Visit (HOSPITAL_COMMUNITY): Payer: Medicare HMO

## 2018-09-05 ENCOUNTER — Other Ambulatory Visit (HOSPITAL_COMMUNITY): Payer: Medicare HMO

## 2018-09-05 ENCOUNTER — Telehealth (HOSPITAL_COMMUNITY): Payer: Self-pay | Admitting: Licensed Clinical Social Worker

## 2018-09-05 ENCOUNTER — Telehealth: Payer: Self-pay

## 2018-09-05 NOTE — Telephone Encounter (Signed)
Please call her and advice her to follow instructions per inpatient  Discharge plan. thanks

## 2018-09-05 NOTE — Telephone Encounter (Signed)
pt daughter states she has some concerns about her mother she was hospitalized twice and she confused about her medications she suppose to take.

## 2018-09-05 NOTE — Telephone Encounter (Signed)
  pt is very confused about her medication.s states that she was in the inpatient unit and when she got her mail today she had a risperidone.5mg  in her box she needs to know about what to take. I ask patient if she was taking the 3mg  that the inpatient doctor order her and she states no.     risperiDONE (RISPERDAL) 3 MG tablet Medication Date: 08/28/2018 Department: [facility] INPATIENT ADULT [facility]B Ordering/Authorizing: Johnn Hai, MD  Order Providers  Prescribing Provider Encounter Provider  Johnn Hai, MD None  Outpatient Medication Detail   Disp Refills Start End   risperiDONE (RISPERDAL) 3 MG tablet 60 tablet 2 08/28/2018    Sig - Route: Take 2 tablets (6 mg total) by mouth at bedtime. - Oral   Class: [facility], [employer] [employer] [place]

## 2018-09-05 NOTE — Telephone Encounter (Signed)
Returned call to [phone] - daughter picked up phone. Discussed that we cannot release information to her and she said she just wants to let us know that her mother needs to be seen sooner in clinic if possible.  Called home number- [phone] - went to voicemail- left message to call us.

## 2018-09-06 ENCOUNTER — Other Ambulatory Visit (HOSPITAL_COMMUNITY): Payer: Medicare HMO

## 2018-09-07 ENCOUNTER — Ambulatory Visit (HOSPITAL_COMMUNITY): Payer: Medicare HMO

## 2018-09-07 ENCOUNTER — Encounter: Payer: Self-pay | Admitting: Psychiatry

## 2018-09-07 ENCOUNTER — Other Ambulatory Visit: Payer: Self-pay

## 2018-09-07 ENCOUNTER — Other Ambulatory Visit (HOSPITAL_COMMUNITY): Payer: Medicare HMO

## 2018-09-07 ENCOUNTER — Ambulatory Visit (INDEPENDENT_AMBULATORY_CARE_PROVIDER_SITE_OTHER): Payer: Medicare HMO | Admitting: Psychiatry

## 2018-09-07 DIAGNOSIS — F251 Schizoaffective disorder, depressive type: Secondary | ICD-10-CM | POA: Diagnosis not present

## 2018-09-07 DIAGNOSIS — F4310 Post-traumatic stress disorder, unspecified: Secondary | ICD-10-CM

## 2018-09-07 DIAGNOSIS — F411 Generalized anxiety disorder: Secondary | ICD-10-CM | POA: Diagnosis not present

## 2018-09-07 MED ORDER — MIRTAZAPINE 7.5 MG PO TABS: 7.5000 mg | ORAL_TABLET | Freq: Every evening | ORAL | 1 refills | Status: DC | PRN

## 2018-09-07 MED ORDER — RISPERIDONE 3 MG PO TABS: 6.0000 mg | ORAL_TABLET | Freq: Every day | ORAL | 2 refills | Status: DC

## 2018-09-07 MED ORDER — AMANTADINE HCL 100 MG PO CAPS: 100.0000 mg | ORAL_CAPSULE | Freq: Two times a day (BID) | ORAL | 1 refills | Status: DC

## 2018-09-07 NOTE — Progress Notes (Signed)
Virtual Visit via Telephone Note  I connected with Heather Haley on 09/07/18 at 10:30 AM EDT by telephone and verified that I am speaking with the correct person using two identifiers.   I discussed the limitations, risks, security and privacy concerns of performing an evaluation and management service by telephone and the availability of in person appointments. I also discussed with the patient that there may be a patient responsible charge related to this service. The patient expressed understanding and agreed to proceed.    I discussed the assessment and treatment plan with the patient. The patient was provided an opportunity to ask questions and all were answered. The patient agreed with the plan and demonstrated an understanding of the instructions.   The patient was advised to call back or seek an in-person evaluation if the symptoms worsen or if the condition fails to improve as anticipated.  [place] MD OP Progress Note  09/07/2018 5:39 PM Heather Haley  MRN:  [id number]  Chief Complaint:  Chief Complaint    Follow-up     HPI: Heather Haley is a 58 year old Caucasian female, divorced, lives in [place], has a history of schizoaffective disorder, PTSD, generalized anxiety disorder, drug-induced Parkinson's disease, memory problems, migraine headaches, diabetes melitis, chronic pain was evaluated by telemedicine today.  Patient preferred to do a phone call.  Patient was recently admitted to behavioral health Hospital in [place] on 08/22/2018.  Patient reports she was admitted for coronavirus.  I have reviewed medical records in [facility] dated 7/14 -08/28/2018- ' per Dr. Jake Samples -" Patient with known history of schizophrenia presented IVC'd.  She was not sleeping not taking care of her personal hygiene was hallucinating and was delusional.  Collateral information obtained from petitioner daughter Heather Haley at [phone] who reported that patient showed [place] the Police substation and  gave her phone to an officer who found the daughter's phone number and contacted her.  Patient was managed on the inpatient unit and was stabilized on risperidone 6 mg and discharged to outpatient follow-up.'  Patient today reports she has not been taking her risperidone as discussed per inpatient provider.  Patient reports she wants Probation officer to send new prescription to [facility].  She was provided a printout by inpatient provider , which she did not fill.  She continues to take risperidone 0.5 mg.  She does report some sleep problems recently.  She did appear to be delusional however it was more about having coronavirus.  When she was told her COVID-19 test came back negative twice she seemed to agree with that and did not seem to be too preoccupied with coronavirus anymore.  She however continues to report some buzzing noise but she  however reports she is able to cope with it and denies any other auditory hallucinations at this time.  She appeared to be alert and oriented to person place situation.  Patient also reports that her daughter comes to visit her often and the relationship may have improved.  Patient advised to start taking risperidone 6 mg.  Discussed with her to go to the nearest local pharmacy to fill the previous prescription and also reassured her that writer will send another one  to [place] today .  Patient denied any suicidality, homicidality .  Discussed with her about restarting mirtazapine lower dosage for her sleep.  She agrees with plan.    Visit Diagnosis:    ICD-10-CM   1. Schizoaffective disorder, depressive type ([place])  F25.1 risperiDONE (RISPERDAL) 3 MG tablet  amantadine (SYMMETREL) 100 MG capsule    mirtazapine (REMERON) 7.5 MG tablet  2. PTSD (post-traumatic stress disorder)  F43.10   3. Generalized anxiety disorder  F41.1     Past Psychiatric History: Reviewed past psychiatric history from my progress note on 05/25/2017.  Patient with recent inpatient  mental health admission to behavioral [facility]- 08/22/2018.  Past Medical History:  Past Medical History:  Diagnosis Date  . Anxiety   . Asthma   . Chronic kidney disease   . Chronic pain    previously saw Dr. Consuela Mimes in pain clinic, then saw pain specialist in [place]  . Depression   . Diabetes mellitus ([place])   . Frequency of urination   . GERD (gastroesophageal reflux disease)   . Headache(784.0)   . High cholesterol   . Hypertension   . IBS (irritable bowel syndrome)   . Left ankle instability   . Left knee DJD   . Lumbar Degenerative Disc Disease of  10/11/2014  . Neuromuscular disorder ([place])   . Osteoarthritis of hip (Right) 05/05/2015  . Other enthesopathy of ankle and tarsus 12/15/2009   Qualifier: Diagnosis of  By: Oneida Alar MD, KARL    . Parkinson's disease ([place])   . Peripheral sensory neuropathy (Bilateral) 11/19/2014  . Postoperative nausea and vomiting   . Schizophrenia [facility])     Past Surgical History:  Procedure Laterality Date  . ABDOMINAL HYSTERECTOMY    . ANKLE SURGERY    . APPENDECTOMY    . COLONOSCOPY  2013  . COLONOSCOPY WITH PROPOFOL N/A 04/25/2017   Procedure: COLONOSCOPY WITH PROPOFOL;  Surgeon: Manya Silvas, MD;  Location: [facility] ENDOSCOPY;  Service: Endoscopy;  Laterality: N/A;  . ESOPHAGOGASTRODUODENOSCOPY (EGD) WITH PROPOFOL N/A 10/03/2014   Procedure: ESOPHAGOGASTRODUODENOSCOPY (EGD) WITH PROPOFOL;  Surgeon: Josefine Class, MD;  Location: [facility] ENDOSCOPY;  Service: Endoscopy;  Laterality: N/A;  . ESOPHAGOGASTRODUODENOSCOPY (EGD) WITH PROPOFOL  04/25/2017   Procedure: ESOPHAGOGASTRODUODENOSCOPY (EGD) WITH PROPOFOL;  Surgeon: Manya Silvas, MD;  Location[facility] END[facility]COPY;  Service: Endoscopy;;  . KNEE ARTHROSCOPY  1997   left knee  . LEFT HEART CATH AND CORONARY ANGIOGRAPHY Left 11/10/2017   Procedure: LEFT HEART CATH AND CORONARY ANGIOGRAPHY;  Surgeon: Isaias Cowman, MD;  Location: [place] CV LAB;  Service:  Cardiovascular;  Laterality: Left;  . LEFT HEART CATHETERIZATION WITH CORONARY ANGIOGRAM N/A 01/11/2013   Procedure: LEFT HEART CATHETERIZATION WITH CORONARY ANGIOGRAM;  Surgeon: Sinclair Grooms, MD;  Location: [facility] CATH LAB;  Service: Cardiovascular;  Laterality: N/A;  . TOTAL KNEE ARTHROPLASTY  08/30/2011   Procedure: TOTAL KNEE ARTHROPLASTY;  Surgeon: Lorn Junes, MD;  Location: [place];  Service: Orthopedics;  Laterality: Left;    Family Psychiatric History: I have reviewed family psychiatric history from my progress note on 05/25/2017. Family History:  Family History  Problem Relation Age of Onset  . Cancer Mother   . Hypertension Father   . Heart disease Father   . Alcohol abuse Father     Social History: I have reviewed social history from my progress note on 05/25/2017. Social History   Socioeconomic History  . Marital status: Divorced    Spouse name: Not on file  . Number of children: 2  . Years of education: 72  . Highest education level: 11th grade  Occupational History    Employer: [employer]  . Financial resource strain: Somewhat hard  . Food insecurity    Worry: Never true    Inability: Never  true  . Transportation needs    Medical: No    Non-medical: No  Tobacco Use  . Smoking status: Former Smoker    Packs/day: 2.00    Years: 15.00    Pack years: 30.00    Types: Cigarettes    Quit date: 02/09/1995    Years since quitting: 23.5  . Smokeless tobacco: Never Used  Substance and Sexual Activity  . Alcohol use: No    Alcohol/week: 0.0 standard drinks  . Drug use: No  . Sexual activity: Not Currently  Lifestyle  . Physical activity    Days per week: 0 days    Minutes per session: 0 min  . Stress: Rather much  Relationships  . Social connections    Talks on phone: More than three times a week    Gets together: Once a week    Attends religious service: Never    Active member of club or organization: No    Attends meetings of clubs or  organizations: Never    Relationship status: Divorced  Other Topics Concern  . Not on file  Social History Narrative   Patient lives at home alone. Patient works at [employer].   Caffeine daily- 2   Right handed.   Education- 11 th grade    Allergies:  Allergies  Allergen Reactions  . Prolixin Decanoate [Fluphenazine]   . Benztropine Other (See Comments)    Hair fall out  Suicide thoughts  . Buprenorphine Hcl Other (See Comments)    Unable to void  . Carbidopa-Levodopa Nausea Only  . Codeine Hives    REACTION: hives  . Duloxetine Other (See Comments)    Altered mental status Alopecia, visual hallucinations, nightmares  . Gabapentin Swelling  . Lyrica [Pregabalin] Other (See Comments)    Alopecia, visual hallucinations, nightmares Altered mental status  . Morphine And Related Other (See Comments)    Unable to void  . Nortripytline Hcl [Nortriptyline] Other (See Comments)    Hair loss and night mares   . Nsaids     REACTION: palpitations, diaphoresis  . Penicillins Nausea And Vomiting and Other (See Comments)    REACTION: upset stomach Has patient had a PCN reaction causing immediate rash, facial/tongue/throat swelling, SOB or lightheadedness with hypotension: No Has patient had a PCN reaction causing severe rash involving mucus membranes or skin necrosis: No Has patient had a PCN reaction that required hospitalization: No Has patient had a PCN reaction occurring within the last 10 years: No If all of the above answers are "NO", then may proceed with Cephalosporin use.  . Tolmetin Other (See Comments)    REACTION: palpitations, diaphoresis  . Wellbutrin [Bupropion]     Constipation, mood swings    Metabolic Disorder Labs: Lab Results  Component Value Date   HGBA1C 6.2 (H) 03/06/2017   MPG 131.24 03/06/2017   MPG 108 05/17/2015   Lab Results  Component Value Date   PROLACTIN 64.2 (H) 03/06/2017   Lab Results  Component Value Date   CHOL 146 08/22/2018    TRIG 172 (H) 08/22/2018   HDL 49 08/22/2018   CHOLHDL 3.0 08/22/2018   VLDL 34 08/22/2018   LDLCALC 63 08/22/2018   LDLCALC 59 03/06/2017   Lab Results  Component Value Date   TSH 0.959 08/22/2018   TSH 2.766 08/24/2017    Therapeutic Level Labs: No results found for: LITHIUM No results found for: VALPROATE No components found for:  CBMZ  Current Medications: Current Outpatient Medications  Medication Sig Dispense  Refill  . albuterol (PROVENTIL HFA;VENTOLIN HFA) 108 (90 Base) MCG/ACT inhaler Inhale 2 puffs into the lungs every 6 (six) hours as needed for wheezing or shortness of breath.    Marland Kitchen amantadine (SYMMETREL) 100 MG capsule Take 1 capsule (100 mg total) by mouth 2 (two) times daily. 180 capsule 1  . aspirin 81 MG tablet Take 81 mg by mouth daily.     . carbidopa-levodopa (SINEMET IR) 25-100 MG tablet Take 1 tablet by mouth 3 (three) times daily.    . cetirizine (ZYRTEC) 10 MG tablet Take 10 mg by mouth daily.    . cholecalciferol (VITAMIN D) 1000 units tablet Take 1,000 Units by mouth daily.    . cyclobenzaprine (FLEXERIL) 10 MG tablet Take 10 mg by mouth 2 (two) times daily.     . diphenhydrAMINE (BENADRYL ALLERGY) 25 mg capsule Take 1 capsule (25 mg total) by mouth daily as needed. For side effects of tremors due to zyprexa 30 capsule 2  . docusate sodium (COLACE) 100 MG capsule Take 100 mg by mouth 2 (two) times daily.     Marland Kitchen glimepiride (AMARYL) 1 MG tablet Take 1 mg by mouth daily with breakfast.     . hydroxypropyl methylcellulose / hypromellose (ISOPTO TEARS / GONIOVISC) 2.5 % ophthalmic solution Place 1 drop into both eyes every 4 (four) hours as needed for dry eyes.    Marland Kitchen losartan (COZAAR) 25 MG tablet Take 25 mg by mouth daily.    . metFORMIN (GLUCOPHAGE) 500 MG tablet Take 1 tablet (500 mg total) by mouth 2 (two) times daily with a meal. For diabetes management 10 tablet 0  . mirtazapine (REMERON) 7.5 MG tablet Take 1 tablet (7.5 mg total) by mouth at bedtime as  needed. sleep 30 tablet 1  . omeprazole (PRILOSEC) 40 MG capsule Take 40 mg by mouth daily as needed (heartburn).     . ondansetron (ZOFRAN) 4 MG tablet Take 4 mg by mouth every 8 (eight) hours as needed for nausea or vomiting.    . pravastatin (PRAVACHOL) 40 MG tablet Take 40 mg by mouth at bedtime.     . risperiDONE (RISPERDAL) 3 MG tablet Take 2 tablets (6 mg total) by mouth at bedtime. 180 tablet 2  . rizatriptan (MAXALT) 10 MG tablet Take 10 mg by mouth 2 (two) times daily. May repeat in 2 hours if needed     . senna (SENOKOT) 8.6 MG tablet Take 1-4 tablets (8.6-34.4 mg total) by mouth daily as needed for constipation. (Patient taking differently: Take 1 tablet by mouth at bedtime. )    . topiramate (TOPAMAX) 100 MG tablet Take 0.5 tablets (50 mg total) by mouth at bedtime. For mood control 30 tablet 1  . traMADol (ULTRAM) 50 MG tablet Take 2 tablets (100 mg total) by mouth 2 (two) times daily for 15 days. 30 tablet 1  . verapamil (CALAN-SR) 120 MG CR tablet Take 1 tablet (120 mg total) by mouth at bedtime. For high blood pressure 90 tablet 1  . vitamin B-12 (CYANOCOBALAMIN) 1000 MCG tablet Take 1,000 mcg by mouth daily.     No current facility-administered medications for this visit.      Musculoskeletal: Strength & Muscle Tone: UTA Gait & Station: UTA Patient leans: N/A  Psychiatric Specialty Exam: Review of Systems  Psychiatric/Behavioral: Positive for hallucinations. The patient is nervous/anxious and has insomnia.   All other systems reviewed and are negative.   There were no vitals taken for this visit.There is no height  or weight on file to calculate BMI.  General Appearance: UTA  Eye Contact:  UTA  Speech:  Clear and Coherent  Volume:  Normal  Mood:  Anxious  Affect:  UTA  Thought Process:  Goal Directed and Descriptions of Associations: Intact  Orientation:  Full (Time, Place, and Person)  Thought Content: Hallucinations: Auditory Buzzing noise  Suicidal Thoughts:   No  Homicidal Thoughts:  No  Memory:  Immediate;   Fair Recent;   Fair Remote;   Fair  Judgement:  Fair  Insight:  Fair  Psychomotor Activity:  UTA  Concentration:  Concentration: Fair and Attention Span: Fair  Recall:  [employer] of Knowledge: Fair  Language: Fair  Akathisia:  No  Handed:  Right  AIMS (if indicated): UTA  Assets:  Communication Skills Desire for Improvement Social Support  ADL's:  Intact  Cognition: WNL  Sleep:  Poor   Screenings: AIMS     Admission (Discharged) from OP Visit from 08/30/2018 in [place] Admission (Discharged) from 08/22/2018 in [place] [facility] Office Visit from 12/01/2017 in [place] Office Visit from 09/20/2017 in [place] Office Visit from 06/29/2017 in [place] Total Score  0  0  1  0  6    AUDIT     Admission (Discharged) from OP Visit from 08/30/2018 in [place] Admission (Discharged) from 08/22/2018 in [place] [facility] Admission (Discharged) from 03/03/2017 in [place] [facility]  Alcohol Use Disorder Identification Test Final Score (AUDIT)  0  0  0    GAD-7     Office Visit from 08/22/2015 in [place] [facility]  Total GAD-7 Score  6    PHQ2-9     Office Visit from 03/23/2016 in [place] Office Visit from 08/22/2015 in [place] at [place] from 08/06/2015 in [place] Office Visit from 06/10/2015 in [place] Clinical Support from 05/19/2015 in [place]  PHQ-2 Total Score  0  1  0  0  0       Assessment and Plan: Karema is a 58 year old Caucasian female who has a history of schizoaffective disorder, PTSD,  migraine headaches, diabetes melitis, drug-induced Parkinson's disease was evaluated by phone today.  Patient preferred to do a phone call.  Patient was recently discharged from inpatient behavioral health Hospital after she had a psychotic episode.  Patient likely continues to be noncompliant with medication regimen.  Some time was spent providing medication education.  Also will refer her for possible community resources and support system.  Coordinate care  with Ms. Royal Piedra LCSW .  Plan For schizoaffective disorder- improving Risperidone 6 mg p.o. daily in divided dosage. Continue amantadine 100 mg p.o. twice daily.  For PTSD-stable We will continue to monitor closely.  For insomnia- unstable Restart mirtazapine 7.5 mg p.o. nightly  I have reviewed medical records in [facility] per Dr. Jake Samples -dated 08/22/2018 as summarized above.  Will refer patient for community support team/ACTT since there is concern about noncompliance.  Follow-up in clinic in 3 to 4 weeks or sooner if needed.  I have spent atleast 25 minutes non face to face with patient today. More than 50 % of the time was spent for psychoeducation and supportive psychotherapy and care coordination.  This note was generated in part or whole with voice recognition software. Voice recognition is usually quite accurate but there are transcription errors that can and very often do occur. I apologize for any typographical errors that were not detected and corrected.        Ursula Alert, MD 09/07/2018, 5:39 PM

## 2018-09-07 NOTE — Patient Instructions (Signed)
Schizoaffective Disorder Schizoaffective disorder (ScAD) is a mental illness. It causes symptoms that are a mixture of a psychotic disorder (schizophrenia) and a mood (affective) disorder. A psychotic disorder involves losing touch with reality. ScAD usually occurs in cycles. Periods of severe symptoms may be followed by periods of less severe symptoms or improvement. The illness affects men and women equally, but it usually appears at an earlier age (teenage or early adult years) in men. People who have family members with schizophrenia, bipolar disorder, or ScAD are at higher risk of developing ScAD. ScAD may interfere with personal relationships or normal daily activities. People with ScAD are at a higher risk for:  Job loss.  Social aloneness (isolation).  Health problems.  Anxiety.  Substance use disorders.  Suicide. What are the causes? The exact cause of this condition is not known. What increases the risk? The following factors may make you more likely to develop this condition:  Problems during your mother's pregnancy and after your birth, such as: ? Your mother having the flu (influenza) during the second semester of the pregnancy. ? Exposure to drugs, alcohol, illnesses, or other poisons (toxins) before birth. ? Low birth weight.  A brain infection or viral infection.  Problems with brain structure or function.  Having family members with bipolar disorder, ScAD, or schizophrenia.  Substance abuse.  Having been diagnosed with a mental health condition in the past.  Being a victim of neglect or long-term (chronic) abuse. What are the signs or symptoms? At any one time, people with ScAD may have psychotic symptoms only or have both psychotic and affective symptoms. Psychotic symptoms may include:  Hearing, seeing, or feeling things that are not there (hallucinations).  Having fixed, false beliefs (delusions). The delusions usually include being attacked, harassed,  or plotted against (paranoid delusions).  Speaking in a way that makes no sense to others (disorganized speech).  Confusing or odd behavior.  Loss of motivation for normal daily activities, such as self-care.  Withdrawal from social contacts (social isolation).  Lack of emotions. Affective symptoms may include:  Symptoms similar to major depression, such as: ? Depressed mood. ? Loss of interest in activities that are usually pleasurable (anhedonia). ? Sleeping more or less than normal. ? Feeling worthless or excessively guilty. ? Lack of energy or motivation. ? Trouble concentrating. ? Eating more or less than usual. ? Thinking a lot about death or suicide.  Symptoms similar to bipolar mania. Bipolar mania refers to periods of severe elation, irritability, and high energy that are experienced by people who have bipolar disorder. These symptoms may include: ? Abnormally elevated or irritable mood. ? Abnormally increased energy or activity. ? More confidence than normal or feeling that you are able to do anything (grandiosity). ? Feeling rested after getting less sleep than normal. ? Being easily distracted. ? Talking more than usual or feeling pressure to keep talking. ? Feeling that your thoughts are racing. ? Engaging in high-risk activities. How is this diagnosed? ScAD is diagnosed through an assessment by your health care provider. Your health care provider may refer you to a mental health specialist for evaluation. The mental health specialist:  Will observe and ask questions about your thoughts, behavior, mood, and ability to function in daily life.  May ask questions about your medical history and use of drugs, including prescription medicines. Certain medical conditions and substances can cause symptoms that resemble ScAD.  May do blood tests and imaging tests. There are two types of ScAD:  Depressive  ScAD. This type is diagnosed when you have only depressive  symptoms.  Bipolar ScAD. This type is diagnosed if your affective symptoms are only manic or are a mixture of manic and depressive. How is this treated? ScAD is usually a lifelong (chronic) illness that requires long-term treatment. Treatment may include:  Medicine. Different types of medicine are used to treat ScAD. The exact combination depends on the type and severity of your symptoms. ? Antipsychotic medicine may be used to control psychotic symptoms such as delusions, paranoia, and hallucinations. ? Mood stabilizers may be used to balance the highs and lows of bipolar manic mood swings. ? Antidepressant medicines may be used to treat depressive symptoms.  Counseling or talk therapy. Individual, group, or family counseling may be helpful in providing education, support, and guidance. Many people with ScAD also benefit from social skills and job skills (vocational) training. A combination of medicine and counseling is usually best for managing the disorder over time. A procedure in which electricity is applied to the brain through the scalp (electroconvulsive therapy) may be used to treat people with severe manic symptoms who do not respond to medicine and counseling. Follow these instructions at home:   Take over-the-counter and prescription medicines only as told by your health care provider. Check with your health care provider before starting new medicines.  Surround yourself with people who care about you and can help you manage your condition.  Keep stress under control. Stress may make symptoms worse.  Avoid alcohol and drugs. They can affect how medicine works and make symptoms worse.  Keep all follow-up visits as told by your health care provider and counselor. This is important. Contact a health care provider if:  You are not able to take your medicines as prescribed.  Your symptoms get worse. Get help right away if:  You feel out of control.  You or others notice  warning signs of suicide, such as: ? Increased use of drugs or alcohol ? Expressing feelings of not having a purpose in life or feeling trapped, guilty, anxious, agitated, or hopeless. ? Withdrawing from friends and family. ? Showing uncontrolled anger, recklessness, and dramatic mood changes. ? Talking about suicide or searching for methods. If you ever feel like you may hurt yourself or others, or have thoughts about taking your own life, get help right away. You can go to your nearest emergency department or call:  Your local emergency services (911 in the [place]  A suicide crisis helpline, such as the [place] at 5136463088. This is open 24 hours a day. Summary  Schizoaffective disorder causes symptoms that are a mixture of a psychotic disorder and a mood disorder.  A combination of medicine and counseling is usually best for managing the disorder over time.  People who have schizoaffective disorder are at risk for suicide. Get help right away if you or someone else notices warning signs of suicide. This information is not intended to replace advice given to you by your health care provider. Make sure you discuss any questions you have with your health care provider. Document Released: 06/07/2006 Document Revised: 01/07/2017 Document Reviewed: 11/07/2015 Elsevier Patient Education  2020 [employer].

## 2018-09-08 ENCOUNTER — Other Ambulatory Visit (HOSPITAL_COMMUNITY): Payer: Medicare HMO

## 2018-09-08 ENCOUNTER — Ambulatory Visit (HOSPITAL_COMMUNITY): Payer: Medicare HMO

## 2018-09-08 ENCOUNTER — Telehealth: Payer: Self-pay

## 2018-09-08 DIAGNOSIS — F251 Schizoaffective disorder, depressive type: Secondary | ICD-10-CM

## 2018-09-08 MED ORDER — RISPERIDONE 3 MG PO TABS: 3.0000 mg | ORAL_TABLET | Freq: Every day | ORAL | 2 refills | Status: DC

## 2018-09-08 NOTE — Telephone Encounter (Signed)
Patient called regarding her Risperidone 3mg . She stated that it gives her restless legs and feels like it takes her breath away at night. Please review and advise. Thank you.

## 2018-09-08 NOTE — Telephone Encounter (Signed)
Patient reports she has restlessness when she takes risperidone.  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  Discussed with patient to reduce the dose of risperidone to 3 mg.  Also discussed taking a Benadryl 25 mg along with the risperidone to help with the side effects.  She does have a history of adverse reaction to Cogentin as well as propranolol.

## 2018-09-11 ENCOUNTER — Other Ambulatory Visit (HOSPITAL_COMMUNITY): Payer: Medicare HMO

## 2018-09-12 ENCOUNTER — Ambulatory Visit (HOSPITAL_COMMUNITY): Payer: Medicare HMO

## 2018-09-12 ENCOUNTER — Other Ambulatory Visit (HOSPITAL_COMMUNITY): Payer: Medicare HMO

## 2018-09-12 ENCOUNTER — Other Ambulatory Visit: Payer: Self-pay | Admitting: Internal Medicine

## 2018-09-12 ENCOUNTER — Telehealth: Payer: Self-pay

## 2018-09-12 DIAGNOSIS — F4310 Post-traumatic stress disorder, unspecified: Secondary | ICD-10-CM

## 2018-09-12 DIAGNOSIS — F411 Generalized anxiety disorder: Secondary | ICD-10-CM

## 2018-09-12 DIAGNOSIS — Z1231 Encounter for screening mammogram for malignant neoplasm of breast: Secondary | ICD-10-CM

## 2018-09-12 DIAGNOSIS — F251 Schizoaffective disorder, depressive type: Secondary | ICD-10-CM

## 2018-09-12 MED ORDER — OLANZAPINE 5 MG PO TABS: 5.0000 mg | ORAL_TABLET | Freq: Every day | ORAL | 0 refills | Status: DC

## 2018-09-12 NOTE — Telephone Encounter (Signed)
Returned call to patient. She is not tolerating Risperidone.Has muscle spasms. The only medication she tolerated in the past is zyprexa. Restart Zyprexa.

## 2018-09-12 NOTE — Telephone Encounter (Signed)
Patient's daughter called regarding patient's Risperidone 3mg . Daughter stated that patient is experiencing restless legs and patient feels like her muscles are aching and she cannot sleep. Please review and advise. Thank you.

## 2018-09-12 NOTE — Telephone Encounter (Signed)
If a change needs to be made, patient would use [facility] on [address] Raynelle Fanning for this. Thank you.

## 2018-09-13 ENCOUNTER — Other Ambulatory Visit (HOSPITAL_COMMUNITY): Payer: Medicare HMO

## 2018-09-14 ENCOUNTER — Ambulatory Visit (HOSPITAL_COMMUNITY): Payer: Medicare HMO

## 2018-09-14 ENCOUNTER — Other Ambulatory Visit (HOSPITAL_COMMUNITY): Payer: Medicare HMO

## 2018-09-15 ENCOUNTER — Ambulatory Visit (HOSPITAL_COMMUNITY): Payer: Medicare HMO

## 2018-09-15 ENCOUNTER — Other Ambulatory Visit (HOSPITAL_COMMUNITY): Payer: Medicare HMO

## 2018-09-18 ENCOUNTER — Telehealth: Payer: Self-pay

## 2018-09-18 DIAGNOSIS — F251 Schizoaffective disorder, depressive type: Secondary | ICD-10-CM

## 2018-09-18 MED ORDER — AMANTADINE HCL 100 MG PO CAPS: 100.0000 mg | ORAL_CAPSULE | Freq: Two times a day (BID) | ORAL | 0 refills | Status: DC

## 2018-09-18 NOTE — Telephone Encounter (Signed)
amantadine (SYMMETREL) 100 MG capsule Medication Date: 09/07/2018 Department: [facility] Ordering/Authorizing: Ursula Alert, MD  Order Providers  Prescribing Provider Encounter Provider  Ursula Alert, MD Ursula Alert, MD  Outpatient Medication Detail   Disp Refills Start End   amantadine (SYMMETREL) 100 MG capsule 180 capsule 1 09/07/2018    Sig - Route: Take 1 capsule (100 mg total) by mouth 2 (two) times daily. - Oral   Sent to pharmacy as: amantadine (SYMMETREL) 100 MG capsule   E-Prescribing Status: Receipt confirmed by pharmacy (09/07/2018 10:31 AM EDT)    pt called left message that she needs a new [facility] so she can uses a saving card. states [facility] was $131.07 for the amantadine.

## 2018-09-18 NOTE — Telephone Encounter (Signed)
[facility]

## 2018-09-18 NOTE — Telephone Encounter (Signed)
pt called back states she needs rx sent to [facility] on [address] instead. Pharmacy has been updated

## 2018-09-19 ENCOUNTER — Telehealth: Payer: Self-pay | Admitting: Internal Medicine

## 2018-09-19 DIAGNOSIS — G5602 Carpal tunnel syndrome, left upper limb: Secondary | ICD-10-CM | POA: Diagnosis not present

## 2018-09-19 DIAGNOSIS — M25562 Pain in left knee: Secondary | ICD-10-CM | POA: Diagnosis not present

## 2018-09-19 DIAGNOSIS — G8929 Other chronic pain: Secondary | ICD-10-CM | POA: Diagnosis not present

## 2018-09-19 DIAGNOSIS — M7918 Myalgia, other site: Secondary | ICD-10-CM | POA: Diagnosis not present

## 2018-09-19 DIAGNOSIS — M533 Sacrococcygeal disorders, not elsewhere classified: Secondary | ICD-10-CM | POA: Diagnosis not present

## 2018-09-19 NOTE — Telephone Encounter (Signed)
Ok to keep appointment as scheduled.

## 2018-09-19 NOTE — Telephone Encounter (Signed)
Called patient for COVID-19 pre-screening for in office visit.  Have you recently traveled any where out of the local area in the last 2 weeks? No  Have you been in close contact with a person diagnosed with COVID-19 or someone awaiting results within the last 2 weeks? No  Do you currently have any of the following symptoms? If so, when did they start?  Cough (yes- about 8 mths)     Diarrhea Joint Pain Fever      Muscle Pain   Red eyes Shortness of breath (yes- about 59mth)     Abdominal pain Vomiting Loss of smell    Rash    Sore Throat Headache    Weakness   Bruising or bleeding

## 2018-09-20 ENCOUNTER — Ambulatory Visit: Payer: Medicare HMO | Admitting: Internal Medicine

## 2018-09-20 ENCOUNTER — Other Ambulatory Visit: Payer: Self-pay

## 2018-09-20 ENCOUNTER — Encounter: Payer: Self-pay | Admitting: Internal Medicine

## 2018-09-20 VITALS — BP 148/80 | HR 112 | Ht 65.0 in | Wt 247.0 lb

## 2018-09-20 DIAGNOSIS — M545 Low back pain: Secondary | ICD-10-CM | POA: Diagnosis not present

## 2018-09-20 DIAGNOSIS — R0602 Shortness of breath: Secondary | ICD-10-CM | POA: Diagnosis not present

## 2018-09-20 DIAGNOSIS — G8929 Other chronic pain: Secondary | ICD-10-CM | POA: Diagnosis not present

## 2018-09-20 DIAGNOSIS — M7918 Myalgia, other site: Secondary | ICD-10-CM | POA: Diagnosis not present

## 2018-09-20 DIAGNOSIS — M5481 Occipital neuralgia: Secondary | ICD-10-CM | POA: Diagnosis not present

## 2018-09-20 NOTE — Progress Notes (Signed)
C-

## 2018-09-20 NOTE — Progress Notes (Signed)
Name: Heather Haley MRN: [id number] DOB: [birth year]     CONSULTATION DATE: 09/20/2018   CHIEF COMPLAINT: SOB    HISTORY OF PRESENT ILLNESS: 58 year old obese white female seen today for chronic shortness of breath Associated with chronic wheezing Associated with chronic dyspnea on exertion With intermittent dry cough nonproductive  Patient had pneumonia and COPD asthma exacerbation 1 year ago responded well to antibiotics and prednisone  Patient is a former smoker 30-pack-year history quit 1996 No signs of infection at this time No signs of COPD exacerbation at this time  Patient has chronic medical conditions including coronary artery disease diabetes morbid obesity CKD  Patient states she had a previous stress test which was within normal limits According to the patient  At this time patient was recently started on Advair but she does not know the dosages or the form of the medication I have advised her to call us with this information   At this time I have explained to the patient that she will need pulmonary function testing to assess her lung fields as well as an overnight pulse oximetry to assess for nocturnal hypoxia     PAST MEDICAL HISTORY :   has a past medical history of Anxiety, Asthma, Chronic kidney disease, Chronic pain, Depression, Diabetes mellitus ([place]), Frequency of urination, GERD (gastroesophageal reflux disease), Headache(784.0), High cholesterol, Hypertension, IBS (irritable bowel syndrome), Left ankle instability, Left knee DJD, Lumbar Degenerative Disc Disease of  (10/11/2014), Neuromuscular disorder ([place]), Osteoarthritis of hip (Right) (05/05/2015), Other enthesopathy of ankle and tarsus (12/15/2009), Parkinson's disease ([place]), Peripheral sensory neuropathy (Bilateral) (11/19/2014), Postoperative nausea and vomiting, and Schizophrenia ([place]).  has a past surgical history that includes Ankle surgery; Abdominal hysterectomy; Knee arthroscopy (1997);  Total knee arthroplasty (08/30/2011); Colonoscopy (2013); Appendectomy; left heart catheterization with coronary angiogram (N/A, 01/11/2013); Esophagogastroduodenoscopy (egd) with propofol (N/A, 10/03/2014); Colonoscopy with propofol (N/A, 04/25/2017); Esophagogastroduodenoscopy (egd) with propofol (04/25/2017); and LEFT HEART CATH AND CORONARY ANGIOGRAPHY (Left, 11/10/2017). Prior to Admission medications   Medication Sig Start Date End Date Taking? Authorizing Provider  albuterol (PROVENTIL HFA;VENTOLIN HFA) 108 (90 Base) MCG/ACT inhaler Inhale 2 puffs into the lungs every 6 (six) hours as needed for wheezing or shortness of breath.   Yes [provider]  amantadine (SYMMETREL) 100 MG capsule Take 1 capsule (100 mg total) by mouth 2 (two) times daily. 09/18/18  Yes Ursula Alert, MD  aspirin 81 MG tablet Take 81 mg by mouth daily.    Yes [provider]  carbidopa-levodopa (SINEMET IR) 25-100 MG tablet Take 1 tablet by mouth 3 (three) times daily.   Yes [provider]  cetirizine (ZYRTEC) 10 MG tablet Take 10 mg by mouth daily.   Yes [provider]  cholecalciferol (VITAMIN D) 1000 units tablet Take 1,000 Units by mouth daily.   Yes [provider]  cyclobenzaprine (FLEXERIL) 10 MG tablet Take 10 mg by mouth 2 (two) times daily.    Yes [provider]  diphenhydrAMINE (BENADRYL ALLERGY) 25 mg capsule Take 1 capsule (25 mg total) by mouth daily as needed. For side effects of tremors due to zyprexa 01/30/18  Yes Eappen, Ria Clock, MD  docusate sodium (COLACE) 100 MG capsule Take 100 mg by mouth 2 (two) times daily.    Yes [provider]  glimepiride (AMARYL) 1 MG tablet Take 1 mg by mouth daily with breakfast.  03/16/18 03/16/19 Yes [provider]  hydroxypropyl methylcellulose / hypromellose (ISOPTO TEARS / GONIOVISC) 2.5 % ophthalmic solution Place 1  drop into both eyes every 4 (four) hours as needed for dry eyes.   Yes [provider]  losartan (COZAAR) 25 MG tablet Take 25 mg by mouth daily.   Yes [provider]  metFORMIN (GLUCOPHAGE) 500 MG tablet Take 1 tablet (500 mg total) by mouth 2 (two) times daily with a meal. For diabetes management 03/10/17  Yes Lindell Spar I, NP  mirtazapine (REMERON) 7.5 MG tablet Take 1 tablet (7.5 mg total) by mouth at bedtime as needed. sleep 09/07/18  Yes Eappen, Ria Clock, MD  OLANZapine (ZYPREXA) 5 MG tablet Take 1 tablet (5 mg total) by mouth at bedtime. 09/12/18  Yes Ursula Alert, MD  omeprazole (PRILOSEC) 40 MG capsule Take 40 mg by mouth daily as needed (heartburn).    Yes [provider]  ondansetron (ZOFRAN) 4 MG tablet Take 4 mg by mouth every 8 (eight) hours as needed for nausea or vomiting.   Yes [provider]  pravastatin (PRAVACHOL) 40 MG tablet Take 40 mg by mouth at bedtime.    Yes [provider]  rizatriptan (MAXALT) 10 MG tablet Take 10 mg by mouth 2 (two) times daily. May repeat in 2 hours if needed    Yes [provider]  senna (SENOKOT) 8.6 MG tablet Take 1-4 tablets (8.6-34.4 mg total) by mouth daily as needed for constipation. Patient taking differently: Take 1 tablet by mouth at bedtime.  03/10/17  Yes Lindell Spar I, NP  topiramate (TOPAMAX) 100 MG tablet Take 0.5 tablets (50 mg total) by mouth at bedtime. For mood control 05/06/17  Yes Eappen, Ria Clock, MD  traMADol (ULTRAM) 50 MG tablet Take 2 tablets (100 mg total) by mouth 2 (two) times daily for 15 days. 08/28/18 09/20/18 Yes Johnn Hai, MD  verapamil (CALAN-SR) 120 MG CR tablet Take 1 tablet (120 mg total) by mouth at bedtime. For high blood pressure 03/10/17  Yes Nwoko, Agnes I, NP  vitamin B-12 (CYANOCOBALAMIN) 1000 MCG tablet Take 1,000 mcg by mouth daily.   Yes [provider]   Allergies  Allergen Reactions  . Prolixin Decanoate [Fluphenazine]   . Benztropine Other (See Comments)    Hair fall out  Suicide thoughts  . Buprenorphine Hcl Other  (See Comments)    Unable to void  . Carbidopa-Levodopa Nausea Only  . Codeine Hives    REACTION: hives  . Duloxetine Other (See Comments)    Altered mental status Alopecia, visual hallucinations, nightmares  . Gabapentin Swelling  . Lyrica [Pregabalin] Other (See Comments)    Alopecia, visual hallucinations, nightmares Altered mental status  . Morphine And Related Other (See Comments)    Unable to void  . Nortripytline Hcl [Nortriptyline] Other (See Comments)    Hair loss and night mares   . Nsaids     REACTION: palpitations, diaphoresis  . Penicillins Nausea And Vomiting and Other (See Comments)    REACTION: upset stomach Has patient had a PCN reaction causing immediate rash, facial/tongue/throat swelling, SOB or lightheadedness with hypotension: No Has patient had a PCN reaction causing severe rash involving mucus membranes or skin necrosis: No Has patient had a PCN reaction that required hospitalization: No Has patient had a PCN reaction occurring within the last 10 years: No If all of the above answers are "NO", then may proceed with Cephalosporin use.  . Tolmetin Other (See Comments)    REACTION: palpitations, diaphoresis  . Wellbutrin [Bupropion]     Constipation, mood swings    FAMILY HISTORY:  family history  includes Alcohol abuse in her father; Cancer in her mother; Heart disease in her father; Hypertension in her father. SOCIAL HISTORY:  reports that she quit smoking about 23 years ago. Her smoking use included cigarettes. She has a 30.00 pack-year smoking history. She has never used smokeless tobacco. She reports that she does not drink alcohol or use drugs.    Review of Systems:  Gen:  Denies  fever, sweats, chills weigh loss  HEENT: Denies blurred vision, double vision, ear pain, eye pain, hearing loss, nose bleeds, sore throat Cardiac:  No dizziness, chest pain or heaviness, chest tightness,edema, No JVD Resp:   + Cough, -sputum production, + shortness of  breath, sign wheezing, -hemoptysis,  Gi: Denies swallowing difficulty, stomach pain, nausea or vomiting, diarrhea, constipation, bowel incontinence Gu:  Denies bladder incontinence, burning urine Ext:   Denies Joint pain, stiffness or swelling Skin: Denies  skin rash, easy bruising or bleeding or hives Endoc:  Denies polyuria, polydipsia , polyphagia or weight change Psych:   Denies depression, insomnia or hallucinations  Other:  All other systems negative   BP (!) 148/80 (BP Location: Left Arm, Cuff Size: Large)   Pulse (!) 112   SpO2 95%     SpO2: 95 % O2 Device: None (Room air)    Physical Examination:   GENERAL:NAD, no fevers, chills, no weakness no fatigue HEAD: Normocephalic, atraumatic.  EYES: PERLA, EOMI No scleral icterus.  NECK: Supple. No thyromegaly.  No JVD.  PULMONARY: CTA B/L no wheezing, rhonchi, crackles CARDIOVASCULAR: S1 and S2. Regular rate and rhythm. No murmurs GASTROINTESTINAL: Soft, nontender, nondistended. Positive bowel sounds.  MUSCULOSKELETAL: + swelling NEUROLOGIC: No gross focal neurological deficits. 5/5 strength all extremities SKIN: No ulceration, lesions, rashes, or cyanosis.  PSYCHIATRIC: Insight, judgment intact. -depression -anxiety ALL OTHER ROS ARE NEGATIVE   MEDICATIONS: I have reviewed all medications and confirmed regimen as documented    CULTURE RESULTS   No results found for this or any previous visit (from the past 240 hour(s)).        IMAGING   CT chest 2019 No acute findings No pneumonia No masses No pneumonia    ASSESSMENT AND PLAN SYNOPSIS  58 year old obese white female seen today for chronic shortness of breath chronic wheezing and dyspnea on exertion in the setting of extensive smoking history with signs and symptoms of underlying obstructive lung disease most likely related to COPD  At this time I would recommend obtaining pulmonary function testing to assess lung function Also recommend obtaining  overnight pulse oximetry to assess for nocturnal hypoxia At this time patient is unable to walk so a 6-minute walk test will not be able to be completed  Based on her smoking history, patient may be a candidate for lung cancer screening referral program  At this time I recommend that patient avoid secondhand smoke exposure Patient is to continue her Advair as prescribed and to call us with detailed information about this medication Patient is to use albuterol as needed  Obesity -recommend significant weight loss -recommend changing diet         COVID-19 EDUCATION: The signs and symptoms of COVID-19 were discussed with the patient and how to seek care for testing.  The importance of social distancing was discussed today. Hand Washing Techniques and avoid touching face was advised.  MEDICATION ADJUSTMENTS/LABS AND TESTS ORDERED: ONO Pulmonary function testing Lung cancer screening referral   CURRENT MEDICATIONS REVIEWED AT LENGTH WITH PATIENT TODAY  Continue Advair as prescribed Albuterol as needed  Patient satisfied with Plan of action and management. All questions answered  Follow up in 3 months   Najah Liverman Patricia Pesa, M.D.  [facility]  Medical Director [place] Director [facility] Cardio-Pulmonary Department

## 2018-09-20 NOTE — Patient Instructions (Addendum)
Obtain PFT's  Check ONO  Lung cancer screening referral  Continue Advair as prescribed  Continue Albuterol as needed

## 2018-09-21 ENCOUNTER — Telehealth: Payer: Self-pay | Admitting: *Deleted

## 2018-09-21 ENCOUNTER — Telehealth: Payer: Self-pay

## 2018-09-21 NOTE — Telephone Encounter (Signed)
Please ask her to stay on both now .Thanks

## 2018-09-21 NOTE — Telephone Encounter (Signed)
left message with directions and ask pt to call office back.

## 2018-09-21 NOTE — Telephone Encounter (Signed)
Received referral for lung screening. Contacted patient and reviewed smoking history which includes a quit date > 15 years ago. Patient is made aware that she does not meet eligibility requirements for lung cancer screening due to her quit date.   Patient reports increased shortness of breath and voice changes. Discussed importance of following up on these symptoms and that I will enter a note and forward to Dr. Mortimer Fries and his nurse to make them aware.

## 2018-09-21 NOTE — Telephone Encounter (Signed)
pt called left a message that she has some questions about how she needs to take her medications. states she needs to know if she suppose to take both the mirtazapine and the olanzapine together or does she stop one of them.

## 2018-09-22 ENCOUNTER — Telehealth: Payer: Self-pay

## 2018-09-22 NOTE — Telephone Encounter (Signed)
I spoke with Heather Haley today MRN #[id number]. She just wanted me to let everyone know to call her first and not call her daughter. The patient stated that her daughter gets very upset. Thank you.

## 2018-09-27 DIAGNOSIS — I10 Essential (primary) hypertension: Secondary | ICD-10-CM | POA: Diagnosis not present

## 2018-09-27 DIAGNOSIS — R3915 Urgency of urination: Secondary | ICD-10-CM | POA: Diagnosis not present

## 2018-09-27 DIAGNOSIS — E119 Type 2 diabetes mellitus without complications: Secondary | ICD-10-CM | POA: Diagnosis not present

## 2018-09-27 DIAGNOSIS — Z79899 Other long term (current) drug therapy: Secondary | ICD-10-CM | POA: Diagnosis not present

## 2018-09-27 DIAGNOSIS — R109 Unspecified abdominal pain: Secondary | ICD-10-CM | POA: Diagnosis not present

## 2018-09-27 DIAGNOSIS — E1165 Type 2 diabetes mellitus with hyperglycemia: Secondary | ICD-10-CM | POA: Diagnosis not present

## 2018-09-27 DIAGNOSIS — Z6841 Body Mass Index (BMI) 40.0 and over, adult: Secondary | ICD-10-CM | POA: Diagnosis not present

## 2018-09-27 DIAGNOSIS — N343 Urethral syndrome, unspecified: Secondary | ICD-10-CM | POA: Diagnosis not present

## 2018-09-28 ENCOUNTER — Other Ambulatory Visit
Admission: RE | Admit: 2018-09-28 | Discharge: 2018-09-28 | Disposition: A | Discharge status: Home / Self Care | Payer: Medicare HMO | Source: Ambulatory Visit | Attending: Internal Medicine | Admitting: Internal Medicine

## 2018-09-28 ENCOUNTER — Other Ambulatory Visit: Payer: Self-pay

## 2018-09-28 DIAGNOSIS — Z01812 Encounter for preprocedural laboratory examination: Secondary | ICD-10-CM | POA: Diagnosis not present

## 2018-09-28 DIAGNOSIS — Z20828 Contact with and (suspected) exposure to other viral communicable diseases: Secondary | ICD-10-CM | POA: Diagnosis not present

## 2018-09-29 LAB — SARS CORONAVIRUS 2 (TAT 6-24 HRS): SARS Coronavirus 2: NEGATIVE

## 2018-10-02 ENCOUNTER — Other Ambulatory Visit: Payer: Self-pay | Admitting: Internal Medicine

## 2018-10-02 ENCOUNTER — Encounter: Payer: Self-pay | Admitting: Internal Medicine

## 2018-10-02 DIAGNOSIS — R109 Unspecified abdominal pain: Secondary | ICD-10-CM

## 2018-10-02 DIAGNOSIS — R0602 Shortness of breath: Secondary | ICD-10-CM

## 2018-10-02 DIAGNOSIS — R0902 Hypoxemia: Secondary | ICD-10-CM | POA: Diagnosis not present

## 2018-10-02 DIAGNOSIS — N343 Urethral syndrome, unspecified: Secondary | ICD-10-CM

## 2018-10-02 DIAGNOSIS — J449 Chronic obstructive pulmonary disease, unspecified: Secondary | ICD-10-CM | POA: Diagnosis not present

## 2018-10-03 ENCOUNTER — Ambulatory Visit (INDEPENDENT_AMBULATORY_CARE_PROVIDER_SITE_OTHER): Payer: Medicare HMO | Admitting: Internal Medicine

## 2018-10-03 ENCOUNTER — Other Ambulatory Visit: Payer: Self-pay

## 2018-10-03 ENCOUNTER — Telehealth: Payer: Self-pay

## 2018-10-03 DIAGNOSIS — J4540 Moderate persistent asthma, uncomplicated: Secondary | ICD-10-CM | POA: Diagnosis not present

## 2018-10-03 DIAGNOSIS — R0602 Shortness of breath: Secondary | ICD-10-CM

## 2018-10-03 LAB — PULMONARY FUNCTION TEST
DL/VA % pred: 148 %
DL/VA: 6.2 ml/min/mmHg/L
DLCO unc % pred: 126 %
DLCO unc: 26.55 ml/min/mmHg
FEF 25-75 Post: 5.15 L/s
FEF 25-75 Pre: 4.42 L/s
FEF2575-%Change-Post: 16 %
FEF2575-%Pred-Post: 210 %
FEF2575-%Pred-Pre: 180 %
FEV1-%Change-Post: 2 %
FEV1-%Pred-Post: 101 %
FEV1-%Pred-Pre: 99 %
FEV1-Post: 2.73 L
FEV1-Pre: 2.66 L
FEV1FVC-%Change-Post: 0 %
FEV1FVC-%Pred-Pre: 114 %
FEV6-%Change-Post: 1 %
FEV6-%Pred-Post: 90 %
FEV6-%Pred-Pre: 88 %
FEV6-Post: 3.01 L
FEV6-Pre: 2.96 L
FEV6FVC-%Pred-Post: 103 %
FEV6FVC-%Pred-Pre: 103 %
FVC-%Change-Post: 1 %
FVC-%Pred-Post: 87 %
FVC-%Pred-Pre: 85 %
FVC-Post: 3.01 L
FVC-Pre: 2.96 L
Post FEV1/FVC ratio: 91 %
Post FEV6/FVC ratio: 100 %
Pre FEV1/FVC ratio: 90 %
Pre FEV6/FVC Ratio: 100 %
RV % pred: 96 %
RV: 1.94 L
TLC % pred: 93 %
TLC: 4.87 L

## 2018-10-03 NOTE — Telephone Encounter (Signed)
Attempted to call patient left voicemail.

## 2018-10-03 NOTE — Telephone Encounter (Signed)
Pt called states she can not take this medication. States she is having anxiety attacks and she feels like she is shaking or crawl feeling.     OLANZapine (ZYPREXA) 5 MG tablet Medication Date: 09/12/2018 Department: [facility] Ordering/Authorizing: Ursula Alert, MD  Order Providers  Prescribing Provider Encounter Provider  Ursula Alert, MD Latina Craver, CMA  Outpatient Medication Detail   Disp Refills Start End   OLANZapine (ZYPREXA) 5 MG tablet 90 tablet 0 09/12/2018    Sig - Route: Take 1 tablet (5 mg total) by mouth at bedtime. - Oral   Sent to pharmacy as: OLANZapine (ZYPREXA) 5 MG tablet   E-Prescribing Status: Receipt confirmed by pharmacy (09/12/2018 3:10 PM EDT)   Associated Diagnoses  Schizoaffective disorder, depressive type [facility]) - [place], [place]

## 2018-10-04 ENCOUNTER — Ambulatory Visit: Payer: Medicare HMO | Admitting: Internal Medicine

## 2018-10-04 ENCOUNTER — Telehealth: Payer: Self-pay

## 2018-10-04 DIAGNOSIS — F251 Schizoaffective disorder, depressive type: Secondary | ICD-10-CM

## 2018-10-04 MED ORDER — OLANZAPINE 5 MG PO TABS: 2.5000 mg | ORAL_TABLET | Freq: Every day | ORAL | 0 refills | Status: DC

## 2018-10-04 NOTE — Progress Notes (Signed)
Full PFT performed today. Patient had a hard time with understanding instruction. Patient had good effort. Patient had difficulty with a deep inhalation.

## 2018-10-04 NOTE — Telephone Encounter (Signed)
Will reduce Zyprexa to 2.5 mg - she has failed multiple trials of antipsychotics in the past or cannot afford it. She will continue amantadine for side effects.

## 2018-10-04 NOTE — Telephone Encounter (Signed)
pt called left message with just her name and phone number

## 2018-10-04 NOTE — Telephone Encounter (Signed)
pt was called back. pt states that she was returning dr. Shea Evans phone call from yesterday. that she is having issues with her medications. pt was told that dr. Shea Evans would be sent the message.

## 2018-10-05 DIAGNOSIS — E538 Deficiency of other specified B group vitamins: Secondary | ICD-10-CM | POA: Diagnosis not present

## 2018-10-09 ENCOUNTER — Ambulatory Visit (INDEPENDENT_AMBULATORY_CARE_PROVIDER_SITE_OTHER): Payer: Medicare HMO | Admitting: Psychiatry

## 2018-10-09 ENCOUNTER — Encounter: Payer: Self-pay | Admitting: Psychiatry

## 2018-10-09 ENCOUNTER — Other Ambulatory Visit: Payer: Self-pay

## 2018-10-09 ENCOUNTER — Encounter

## 2018-10-09 ENCOUNTER — Ambulatory Visit
Admission: RE | Admit: 2018-10-09 | Discharge: 2018-10-09 | Disposition: A | Discharge status: Home / Self Care | Payer: Medicare HMO | Source: Ambulatory Visit | Attending: Internal Medicine | Admitting: Internal Medicine

## 2018-10-09 DIAGNOSIS — R109 Unspecified abdominal pain: Secondary | ICD-10-CM | POA: Diagnosis not present

## 2018-10-09 DIAGNOSIS — F411 Generalized anxiety disorder: Secondary | ICD-10-CM

## 2018-10-09 DIAGNOSIS — F4310 Post-traumatic stress disorder, unspecified: Secondary | ICD-10-CM | POA: Diagnosis not present

## 2018-10-09 DIAGNOSIS — F251 Schizoaffective disorder, depressive type: Secondary | ICD-10-CM

## 2018-10-09 DIAGNOSIS — K760 Fatty (change of) liver, not elsewhere classified: Secondary | ICD-10-CM | POA: Diagnosis not present

## 2018-10-09 DIAGNOSIS — G2119 Other drug induced secondary parkinsonism: Secondary | ICD-10-CM

## 2018-10-09 DIAGNOSIS — N343 Urethral syndrome, unspecified: Secondary | ICD-10-CM | POA: Diagnosis not present

## 2018-10-09 DIAGNOSIS — F5105 Insomnia due to other mental disorder: Secondary | ICD-10-CM

## 2018-10-09 MED ORDER — OLANZAPINE 5 MG PO TABS: 5.0000 mg | ORAL_TABLET | Freq: Every day | ORAL | 0 refills | Status: DC

## 2018-10-09 MED ORDER — SERTRALINE HCL 25 MG PO TABS: 25.0000 mg | ORAL_TABLET | Freq: Every day | ORAL | 0 refills | Status: DC

## 2018-10-09 NOTE — Progress Notes (Signed)
Virtual Visit via Telephone Note  I connected with Heather Haley on 10/09/18 at  2:00 PM EDT by telephone and verified that I am speaking with the correct person using two identifiers.   I discussed the limitations, risks, security and privacy concerns of performing an evaluation and management service by telephone and the availability of in person appointments. I also discussed with the patient that there may be a patient responsible charge related to this service. The patient expressed understanding and agreed to proceed.    I discussed the assessment and treatment plan with the patient. The patient was provided an opportunity to ask questions and all were answered. The patient agreed with the plan and demonstrated an understanding of the instructions.   The patient was advised to call back or seek an in-person evaluation if the symptoms worsen or if the condition fails to improve as anticipated.  [place] MD OP Progress Note  10/09/2018 5:31 PM Heather Haley  MRN:  [id number]  Chief Complaint:  Chief Complaint    Follow-up     HPI: Heather Haley is a 58 year old Caucasian female, divorced,lives in [place], has a history of schizoaffective disorder, PTSD, GAD, drug-induced Parkinson's disease, memory problems, migraine headaches, diabetes mellitus, chronic pain was evaluated by phone today.  Patient preferred to do a phone call.  Patient today reports she continues to struggle with anxiety symptoms.  She reports she feels on edge and anxious often.  She stopped taking the mirtazapine since she felt it was not helpful.  She reports sleep is good without the mirtazapine.  She is currently back on olanzapine 5 mg even though she was advised to take 2.5 mg recently due to possible side effects.  She today reports that her possible adverse symptoms were due to other causes or not due to the Zyprexa and hence she kept taking it.  Patient currently denies any perceptual disturbances.  She  denies any suicidality or homicidality.  Discussed starting Zoloft.  She agrees with plan.    visit Diagnosis:    ICD-10-CM   1. Schizoaffective disorder, depressive type ([place])  F25.1 OLANZapine (ZYPREXA) 5 MG tablet    sertraline (ZOLOFT) 25 MG tablet  2. PTSD (post-traumatic stress disorder)  F43.10   3. Generalized anxiety disorder  F41.1   4. Insomnia due to mental disorder  F51.05   5. Drug-induced Parkinson's disease ([place])  G21.19     Past Psychiatric History: I have reviewed past psychiatric history from my progress note on 05/25/2017.  Past Medical History:  Past Medical History:  Diagnosis Date  . Anxiety   . Asthma   . Chronic kidney disease   . Chronic pain    previously saw Dr. Consuela Mimes in pain clinic, then saw pain specialist in [place]  . Depression   . Diabetes mellitus ([place])   . Frequency of urination   . GERD (gastroesophageal reflux disease)   . Headache(784.0)   . High cholesterol   . Hypertension   . IBS (irritable bowel syndrome)   . Left ankle instability   . Left knee DJD   . Lumbar Degenerative Disc Disease of  10/11/2014  . Neuromuscular disorder ([place])   . Osteoarthritis of hip (Right) 05/05/2015  . Other enthesopathy of ankle and tarsus 12/15/2009   Qualifier: Diagnosis of  By: Oneida Alar MD, KARL    . Parkinson's disease ([place])   . Peripheral sensory neuropathy (Bilateral) 11/19/2014  . Postoperative nausea and vomiting   . Schizophrenia ([place])  Past Surgical History:  Procedure Laterality Date  . ABDOMINAL HYSTERECTOMY    . ANKLE SURGERY    . APPENDECTOMY    . COLONOSCOPY  2013  . COLONOSCOPY WITH PROPOFOL N/A 04/25/2017   Procedure: COLONOSCOPY WITH PROPOFOL;  Surgeon: Manya Silvas, MD;  Location[facility] ENDOSCOPY;  Service: Endoscopy;  Laterality: N/A;  . ESOPHAGOGASTRODUODENOSCOPY (EGD) WITH PROPOFOL N/A 10/03/2014   Procedure: ESOPHAGOGASTRODUODENOSCOPY (EGD) WITH PROPOFOL;  Surgeon: Josefine Class, MD;  Location: [facility] ENDOSCOPY;   Service: Endoscopy;  Laterality: N/A;  . ESOPHAGOGASTRODUODENOSCOPY (EGD) WITH PROPOFOL  04/25/2017   Procedure: ESOPHAGOGASTRODUODENOSCOPY (EGD) WITH PROPOFOL;  Surgeon: Manya Silvas, MD;  Location: [facility] ENDOSCOPY;  Service: Endoscopy;;  . KNEE ARTHROSCOPY  1997   left knee  . LEFT HEART CATH AND CORONARY ANGIOGRAPHY Left 11/10/2017   Procedure: LEFT HEART CATH AND CORONARY ANGIOGRAPHY;  Surgeon: Isaias Cowman, MD;  Location: [place] [facility] CV LAB;  Service: Cardiovascular;  Laterality: Left;  . LEFT HEART CATHETERIZATION WITH CORONARY ANGIOGRAM N/A 01/11/2013   Procedure: LEFT HEART CATHETERIZATION WITH CORONARY ANGIOGRAM;  Surgeon: Sinclair Grooms, MD;  Location: [facility] CATH LAB;  Service: Cardiovascular;  Laterality: N/A;  . TOTAL KNEE ARTHROPLASTY  08/30/2011   Procedure: TOTAL KNEE ARTHROPLASTY;  Surgeon: Lorn Junes, MD;  Location: [place];  Service: Orthopedics;  Laterality: Left;    Family Psychiatric History: Reviewed family psychiatric history from my progress note on 05/25/2017  Family History:  Family History  Problem Relation Age of Onset  . Cancer Mother   . Hypertension Father   . Heart disease Father   . Alcohol abuse Father     Social History: Reviewed social history from my progress note on 05/25/2017 Social History   Socioeconomic History  . Marital status: Divorced    Spouse name: Not on file  . Number of children: 2  . Years of education: 56  . Highest education level: 11th grade  Occupational History    Employer: [employer] Needs  . Financial resource strain: Somewhat hard  . Food insecurity    Worry: Never true    Inability: Never true  . Transportation needs    Medical: No    Non-medical: No  Tobacco Use  . Smoking status: Former Smoker    Packs/day: 2.00    Years: 15.00    Pack years: 30.00    Types: Cigarettes    Quit date: 02/09/1995    Years since quitting: 23.6  . Smokeless tobacco: Never Used  Substance and Sexual  Activity  . Alcohol use: No    Alcohol/week: 0.0 standard drinks  . Drug use: No  . Sexual activity: Not Currently  Lifestyle  . Physical activity    Days per week: 0 days    Minutes per session: 0 min  . Stress: Rather much  Relationships  . Social connections    Talks on phone: More than three times a week    Gets together: Once a week    Attends religious service: Never    Active member of club or organization: No    Attends meetings of clubs or organizations: Never    Relationship status: Divorced  Other Topics Concern  . Not on file  Social History Narrative   Patient lives at home alone. Patient works at [employer].   Caffeine daily- 2   Right handed.   Education- 11 th grade    Allergies:  Allergies  Allergen Reactions  . Prolixin Decanoate [Fluphenazine]   .  Benztropine Other (See Comments)    Hair fall out  Suicide thoughts  . Buprenorphine Hcl Other (See Comments)    Unable to void  . Carbidopa-Levodopa Nausea Only  . Codeine Hives    REACTION: hives  . Duloxetine Other (See Comments)    Altered mental status Alopecia, visual hallucinations, nightmares  . Gabapentin Swelling  . Lyrica [Pregabalin] Other (See Comments)    Alopecia, visual hallucinations, nightmares Altered mental status  . Morphine And Related Other (See Comments)    Unable to void  . Nortripytline Hcl [Nortriptyline] Other (See Comments)    Hair loss and night mares   . Nsaids     REACTION: palpitations, diaphoresis  . Penicillins Nausea And Vomiting and Other (See Comments)    REACTION: upset stomach Has patient had a PCN reaction causing immediate rash, facial/tongue/throat swelling, SOB or lightheadedness with hypotension: No Has patient had a PCN reaction causing severe rash involving mucus membranes or skin necrosis: No Has patient had a PCN reaction that required hospitalization: No Has patient had a PCN reaction occurring within the last 10 years: No If all of the above  answers are "NO", then may proceed with Cephalosporin use.  . Tolmetin Other (See Comments)    REACTION: palpitations, diaphoresis  . Wellbutrin [Bupropion]     Constipation, mood swings    Metabolic Disorder Labs: Lab Results  Component Value Date   HGBA1C 6.2 (H) 03/06/2017   MPG 131.24 03/06/2017   MPG 108 05/17/2015   Lab Results  Component Value Date   PROLACTIN 64.2 (H) 03/06/2017   Lab Results  Component Value Date   CHOL 146 08/22/2018   TRIG 172 (H) 08/22/2018   HDL 49 08/22/2018   CHOLHDL 3.0 08/22/2018   VLDL 34 08/22/2018   LDLCALC 63 08/22/2018   LDLCALC 59 03/06/2017   Lab Results  Component Value Date   TSH 0.959 08/22/2018   TSH 2.766 08/24/2017    Therapeutic Level Labs: No results found for: LITHIUM No results found for: VALPROATE No components found for:  CBMZ  Current Medications: Current Outpatient Medications  Medication Sig Dispense Refill  . acetaminophen (TYLENOL) 500 MG tablet Take 500 mg by mouth every 6 (six) hours as needed.    Marland Kitchen albuterol (PROVENTIL HFA;VENTOLIN HFA) 108 (90 Base) MCG/ACT inhaler Inhale 2 puffs into the lungs every 6 (six) hours as needed for wheezing or shortness of breath.    . Alcohol Swabs (B-D SINGLE USE SWABS REGULAR) PADS     . amantadine (SYMMETREL) 100 MG capsule Take 1 capsule (100 mg total) by mouth 2 (two) times daily. 60 capsule 0  . aspirin 81 MG tablet Take 81 mg by mouth daily.     . Blood Glucose Calibration (ACCU-CHEK AVIVA) SOLN     . Blood Glucose Monitoring Suppl (ACCU-CHEK AVIVA PLUS) w/Device KIT     . carbidopa-levodopa (SINEMET IR) 25-100 MG tablet Take 1 tablet by mouth 3 (three) times daily.    . cetirizine (ZYRTEC) 10 MG tablet Take 10 mg by mouth daily.    . cholecalciferol (VITAMIN D) 1000 units tablet Take 1,000 Units by mouth daily.    . cyclobenzaprine (FLEXERIL) 10 MG tablet Take 10 mg by mouth 2 (two) times daily.     . cyclobenzaprine (FLEXERIL) 5 MG tablet     . diphenhydrAMINE  (BENADRYL ALLERGY) 25 mg capsule Take 1 capsule (25 mg total) by mouth daily as needed. For side effects of tremors due to zyprexa 30 capsule  2  . docusate sodium (COLACE) 100 MG capsule Take 100 mg by mouth 2 (two) times daily.     Marland Kitchen glimepiride (AMARYL) 1 MG tablet Take 1 mg by mouth daily with breakfast.     . HYDROcodone-acetaminophen (NORCO/VICODIN) 5-325 MG tablet Take 0.5 tablets by mouth every 12 (twelve) hours as needed for moderate pain.    . hydroxypropyl methylcellulose / hypromellose (ISOPTO TEARS / GONIOVISC) 2.5 % ophthalmic solution Place 1 drop into both eyes every 4 (four) hours as needed for dry eyes.    Marland Kitchen losartan (COZAAR) 25 MG tablet Take 25 mg by mouth daily.    . metFORMIN (GLUCOPHAGE) 500 MG tablet Take 1 tablet (500 mg total) by mouth 2 (two) times daily with a meal. For diabetes management 10 tablet 0  . OLANZapine (ZYPREXA) 5 MG tablet Take 1 tablet (5 mg total) by mouth at bedtime. 90 tablet 0  . omeprazole (PRILOSEC) 40 MG capsule Take 40 mg by mouth daily as needed (heartburn).     . ondansetron (ZOFRAN) 4 MG tablet Take 4 mg by mouth every 8 (eight) hours as needed for nausea or vomiting.    . pravastatin (PRAVACHOL) 40 MG tablet Take 40 mg by mouth at bedtime.     . rizatriptan (MAXALT) 10 MG tablet Take 10 mg by mouth 2 (two) times daily. May repeat in 2 hours if needed     . rOPINIRole (REQUIP) 0.25 MG tablet Take 0.75 mg by mouth 3 (three) times daily.    Marland Kitchen senna (SENOKOT) 8.6 MG tablet Take 1-4 tablets (8.6-34.4 mg total) by mouth daily as needed for constipation. (Patient taking differently: Take 1 tablet by mouth at bedtime. )    . sertraline (ZOLOFT) 25 MG tablet Take 1 tablet (25 mg total) by mouth daily with breakfast. 90 tablet 0  . topiramate (TOPAMAX) 100 MG tablet Take 0.5 tablets (50 mg total) by mouth at bedtime. For mood control 30 tablet 1  . traMADol (ULTRAM) 50 MG tablet Take 2 tablets (100 mg total) by mouth 2 (two) times daily for 15 days. 30  tablet 1  . verapamil (CALAN-SR) 120 MG CR tablet Take 1 tablet (120 mg total) by mouth at bedtime. For high blood pressure 90 tablet 1  . vitamin B-12 (CYANOCOBALAMIN) 1000 MCG tablet Take 1,000 mcg by mouth daily.     No current facility-administered medications for this visit.      Musculoskeletal: Strength & Muscle Tone: UTA Gait & Station: UTA Patient leans: N/A  Psychiatric Specialty Exam: Review of Systems  Psychiatric/Behavioral: The patient is nervous/anxious.   All other systems reviewed and are negative.   There were no vitals taken for this visit.There is no height or weight on file to calculate BMI.  General Appearance: Casual  Eye Contact:  Fair  Speech:  Clear and Coherent  Volume:  Normal  Mood:  Anxious  Affect:  UTA  Thought Process:  Goal Directed and Descriptions of Associations: Intact  Orientation:  Full (Time, Place, and Person)  Thought Content: Logical   Suicidal Thoughts:  No  Homicidal Thoughts:  No  Memory:  Immediate;   Fair Recent;   Fair Remote;   Fair  Judgement:  Fair  Insight:  Fair  Psychomotor Activity:  Normal  Concentration:  Concentration: Fair and Attention Span: Fair  Recall:  [employer] of Knowledge: Fair  Language: Fair  Akathisia:  No  Handed:  Right  AIMS (if indicated):UTA  Assets:  Armed forces logistics/support/administrative officer  Desire for Improvement Housing Social Support  ADL's:  Intact  Cognition: WNL  Sleep:  Fair   Screenings: AIMS     Admission (Discharged) from OP Visit from 08/30/2018 in [place] Admission (Discharged) from 08/22/2018 in [facility] [facility] Office Visit from 12/01/2017 in [place] Office Visit from 09/20/2017 in [place] Office Visit from 06/29/2017 in [place] Total Score  0  0  1  0  6    AUDIT     Admission (Discharged) from OP Visit from 08/30/2018 in [place] Admission (Discharged) from 08/22/2018 in [place] [facility] Admission (Discharged) from 03/03/2017 in [place] [facility]  Alcohol Use Disorder Identification Test Final Score (AUDIT)  0  0  0    GAD-7     Office Visit from 08/22/2015 in [place] at [facility]  Total GAD-7 Score  6    PHQ2-9     Office Visit from 03/23/2016 in [place] Office Visit from 08/22/2015 in [place] at [place] from 08/06/2015 in [place] Office Visit from 06/10/2015 in [place] Clinical Support from 05/19/2015 in [place]  PHQ-2 Total Score  0  1  0  0  0       Assessment and Plan: Heather Haley is a 58 year old Caucasian female who has a history of schizoaffective disorder, PTSD, migraine headaches, diabetes melitis, drug-induced Parkinson's disease was evaluated by phone today.  Patient reports she is currently struggling with anxiety symptoms.  She reports she is interested in medication management.  Discussed Zoloft.  Plan as noted below.  Plan Schizoaffective disorder- some progress Zyprexa 5 mg p.o. nightly Amantadine 100 mg p.o. twice daily Start Zoloft 25 mg p.o. daily with breakfast  For PTSD-stable We will continue to monitor closely.  GAD - unstable Start Zoloft 25 mg po daily. For insomnia-stable She is noncompliant on mirtazapine-discontinue the same.  Follow-up in clinic in 1 month or sooner if needed.  September 28 at 3 PM.  I have spent atleast 15 minutes non face to face with patient today. More than 50 % of the time was spent for psychoeducation and supportive psychotherapy and care coordination. This note was generated in part or whole with voice recognition software. Voice recognition is usually quite accurate but there  are transcription errors that can and very often do occur. I apologize for any typographical errors that were not detected and corrected.       Ursula Alert, MD 10/09/2018, 5:31 PM

## 2018-10-10 ENCOUNTER — Telehealth: Payer: Self-pay | Admitting: Internal Medicine

## 2018-10-10 DIAGNOSIS — G4734 Idiopathic sleep related nonobstructive alveolar hypoventilation: Secondary | ICD-10-CM

## 2018-10-10 NOTE — Telephone Encounter (Signed)
Pt is aware of results and voiced her understanding.  Order has been placed for 1L QHS.  Nothing further is needed.

## 2018-10-10 NOTE — Telephone Encounter (Signed)
Pt returning call.  [phone].

## 2018-10-10 NOTE — Telephone Encounter (Signed)
ONO reviewed by Dr. Mortimer Fries- recommend 1L QHS.  Left message to make pt aware of results.

## 2018-10-12 ENCOUNTER — Ambulatory Visit: Payer: Medicare HMO | Admitting: Psychiatry

## 2018-10-16 DIAGNOSIS — J4540 Moderate persistent asthma, uncomplicated: Secondary | ICD-10-CM | POA: Diagnosis not present

## 2018-10-16 DIAGNOSIS — J441 Chronic obstructive pulmonary disease with (acute) exacerbation: Secondary | ICD-10-CM | POA: Diagnosis not present

## 2018-10-16 DIAGNOSIS — R0609 Other forms of dyspnea: Secondary | ICD-10-CM | POA: Diagnosis not present

## 2018-10-16 DIAGNOSIS — R0602 Shortness of breath: Secondary | ICD-10-CM | POA: Diagnosis not present

## 2018-10-18 ENCOUNTER — Ambulatory Visit: Payer: Medicare HMO | Admitting: Psychiatry

## 2018-10-19 ENCOUNTER — Ambulatory Visit (INDEPENDENT_AMBULATORY_CARE_PROVIDER_SITE_OTHER): Payer: Medicare HMO | Admitting: Internal Medicine

## 2018-10-19 ENCOUNTER — Encounter: Payer: Self-pay | Admitting: Internal Medicine

## 2018-10-19 ENCOUNTER — Other Ambulatory Visit: Payer: Self-pay

## 2018-10-19 DIAGNOSIS — R0602 Shortness of breath: Secondary | ICD-10-CM

## 2018-10-19 DIAGNOSIS — J181 Lobar pneumonia, unspecified organism: Secondary | ICD-10-CM

## 2018-10-19 NOTE — Patient Instructions (Signed)
Continue oxygen as prescribed Obtain CT of the chest Continue inhalers as prescribed

## 2018-10-19 NOTE — Progress Notes (Signed)
Name: Heather Haley MRN: [id number] DOB: [birth year]     I connected with the patient by telephone enabled telemedicine visit and verified that I am speaking with the correct person using two identifiers.    I discussed the limitations, risks, security and privacy concerns of performing an evaluation and management service by telemedicine and the availability of in-person appointments. I also discussed with the patient that there may be a patient responsible charge related to this service. The patient expressed understanding and agreed to proceed.  PATIENT AGREES AND CONFIRMS -YES   Other persons participating in the visit and their role in the encounter: Patient, nursing  This visit type was conducted due to national recommendations for restrictions regarding the COVID-19 Pandemic (e.g. social distancing).  This format is felt to be most appropriate for this patient at this time.  All issues noted in this document were discussed and addressed.     Patient wears oxygen 1L [place] at night PFT's no obstruction   CONSULTATION DATE: 10/19/2018   CHIEF COMPLAINT: worsening SOB    HISTORY OF PRESENT ILLNESS: Patient with progressive chronic shortness of breath and dyspnea on exertion since last office visit She has chronic wheezing Dry intermittent cough  Patient had pneumonia approximately 1 year ago states that she responded to antibiotics and prednisone well Former smoker 30-pack-year history quit 1996 No signs of infection at this time  Patient still has ongoing wheezing Pulmonary function testing does not reveal significant obstructive airways disease  Patient will need further work-up with CT chest to assess for underlying chronic pneumonia or interstitial lung disease  Patient with CKD and also diabetes and CAD  Patient has chronic nocturnal hypoxia on oxygen at this time She uses and benefits from therapy  She uses Advair and it seems to be helping a little  No signs  of infection at this time Patient has progressive worsening shortness of breath   PAST MEDICAL HISTORY :   has a past medical history of Anxiety, Asthma, Chronic kidney disease, Chronic pain, Depression, Diabetes mellitus ([place]), Frequency of urination, GERD (gastroesophageal reflux disease), Headache(784.0), High cholesterol, Hypertension, IBS (irritable bowel syndrome), Left ankle instability, Left knee DJD, Lumbar Degenerative Disc Disease of  (10/11/2014), Neuromuscular disorder ([place]), Osteoarthritis of hip (Right) (05/05/2015), Other enthesopathy of ankle and tarsus (12/15/2009), Parkinson's disease ([place]), Peripheral sensory neuropathy (Bilateral) (11/19/2014), Postoperative nausea and vomiting, and Schizophrenia ([place]).  has a past surgical history that includes Ankle surgery; Abdominal hysterectomy; Knee arthroscopy (1997); Total knee arthroplasty (08/30/2011); Colonoscopy (2013); Appendectomy; left heart catheterization with coronary angiogram (N/A, 01/11/2013); Esophagogastroduodenoscopy (egd) with propofol (N/A, 10/03/2014); Colonoscopy with propofol (N/A, 04/25/2017); Esophagogastroduodenoscopy (egd) with propofol (04/25/2017); and LEFT HEART CATH AND CORONARY ANGIOGRAPHY (Left, 11/10/2017). Prior to Admission medications   Medication Sig Start Date End Date Taking? Authorizing Provider  albuterol (PROVENTIL HFA;VENTOLIN HFA) 108 (90 Base) MCG/ACT inhaler Inhale 2 puffs into the lungs every 6 (six) hours as needed for wheezing or shortness of breath.   Yes [provider]  amantadine (SYMMETREL) 100 MG capsule Take 1 capsule (100 mg total) by mouth 2 (two) times daily. 09/18/18  Yes Ursula Alert, MD  aspirin 81 MG tablet Take 81 mg by mouth daily.    Yes [provider]  carbidopa-levodopa (SINEMET IR) 25-100 MG tablet Take 1 tablet by mouth 3 (three) times daily.   Yes [provider]  cetirizine (ZYRTEC) 10 MG tablet Take 10 mg by mouth daily.   Yes [provider]  cholecalciferol (VITAMIN D) 1000 units tablet Take 1,000 Units by mouth daily.   Yes [provider]  cyclobenzaprine (FLEXERIL) 10 MG tablet Take 10 mg by mouth 2 (two) times daily.    Yes [provider]  diphenhydrAMINE (BENADRYL ALLERGY) 25 mg capsule Take 1 capsule (25 mg total) by mouth daily as needed. For side effects of tremors due to zyprexa 01/30/18  Yes Eappen, Ria Clock, MD  docusate sodium (COLACE) 100 MG capsule Take 100 mg by mouth 2 (two) times daily.    Yes [provider]  glimepiride (AMARYL) 1 MG tablet Take 1 mg by mouth daily with breakfast.  03/16/18 03/16/19 Yes [provider]  hydroxypropyl methylcellulose / hypromellose (ISOPTO TEARS / GONIOVISC) 2.5 % ophthalmic solution Place 1 drop into both eyes every 4 (four) hours as needed for dry eyes.   Yes [provider]  losartan (COZAAR) 25 MG tablet Take 25 mg by mouth daily.   Yes [provider]  metFORMIN (GLUCOPHAGE) 500 MG tablet Take 1 tablet (500 mg total) by mouth 2 (two) times daily with a meal. For diabetes management 03/10/17  Yes Lindell Spar I, NP  mirtazapine (REMERON) 7.5 MG tablet Take 1 tablet (7.5 mg total) by mouth at bedtime as needed. sleep 09/07/18  Yes Eappen, Ria Clock, MD  OLANZapine (ZYPREXA) 5 MG tablet Take 1 tablet (5 mg total) by mouth at bedtime. 09/12/18  Yes Ursula Alert, MD  omeprazole (PRILOSEC) 40 MG capsule Take 40 mg by mouth daily as needed (heartburn).    Yes [provider]  ondansetron (ZOFRAN) 4 MG tablet Take 4 mg by mouth every 8 (eight) hours as needed for nausea or vomiting.   Yes [provider]  pravastatin (PRAVACHOL) 40 MG tablet Take 40 mg by mouth at bedtime.    Yes [provider]  rizatriptan (MAXALT) 10 MG tablet Take 10 mg by mouth 2 (two) times daily. May repeat in 2 hours if needed    Yes [provider]  senna (SENOKOT) 8.6 MG tablet Take 1-4 tablets (8.6-34.4 mg total) by mouth daily as  needed for constipation. Patient taking differently: Take 1 tablet by mouth at bedtime.  03/10/17  Yes Lindell Spar I, NP  topiramate (TOPAMAX) 100 MG tablet Take 0.5 tablets (50 mg total) by mouth at bedtime. For mood control 05/06/17  Yes Eappen, Ria Clock, MD  traMADol (ULTRAM) 50 MG tablet Take 2 tablets (100 mg total) by mouth 2 (two) times daily for 15 days. 08/28/18 09/20/18 Yes Johnn Hai, MD  verapamil (CALAN-SR) 120 MG CR tablet Take 1 tablet (120 mg total) by mouth at bedtime. For high blood pressure 03/10/17  Yes Nwoko, Agnes I, NP  vitamin B-12 (CYANOCOBALAMIN) 1000 MCG tablet Take 1,000 mcg by mouth daily.   Yes [provider]   Allergies  Allergen Reactions  . Prolixin Decanoate [Fluphenazine]   . Benztropine Other (See Comments)    Hair fall out  Suicide thoughts  . Buprenorphine Hcl Other (See Comments)    Unable to void  . Carbidopa-Levodopa Nausea Only  . Codeine Hives    REACTION: hives  . Duloxetine Other (See Comments)    Altered mental status Alopecia, visual hallucinations, nightmares  . Gabapentin Swelling  . Lyrica [Pregabalin] Other (See Comments)    Alopecia, visual hallucinations, nightmares Altered mental status  . Morphine And Related Other (See Comments)    Unable to void  . Nortripytline Hcl [Nortriptyline] Other (See Comments)    Hair loss and  night mares   . Nsaids     REACTION: palpitations, diaphoresis  . Penicillins Nausea And Vomiting and Other (See Comments)    REACTION: upset stomach Has patient had a PCN reaction causing immediate rash, facial/tongue/throat swelling, SOB or lightheadedness with hypotension: No Has patient had a PCN reaction causing severe rash involving mucus membranes or skin necrosis: No Has patient had a PCN reaction that required hospitalization: No Has patient had a PCN reaction occurring within the last 10 years: No If all of the above answers are "NO", then may proceed with Cephalosporin use.  . Tolmetin  Other (See Comments)    REACTION: palpitations, diaphoresis  . Wellbutrin [Bupropion]     Constipation, mood swings    FAMILY HISTORY:  family history includes Alcohol abuse in her father; Cancer in her mother; Heart disease in her father; Hypertension in her father. SOCIAL HISTORY:  reports that she quit smoking about 23 years ago. Her smoking use included cigarettes. She has a 30.00 pack-year smoking history. She has never used smokeless tobacco. She reports that she does not drink alcohol or use drugs.    Review of Systems:  Gen:  Denies  fever, sweats, chills weight loss  HEENT: Denies blurred vision, double vision, ear pain, eye pain, hearing loss, nose bleeds, sore throat Cardiac:  No dizziness, chest pain or heaviness, chest tightness,edema, No JVD Resp:   No cough, -sputum production, +shortness of breath,+wheezing, -hemoptysis, +DOE Gi: Denies swallowing difficulty, stomach pain, nausea or vomiting, diarrhea, constipation, bowel incontinence Gu:  Denies bladder incontinence, burning urine Ext:   Denies Joint pain, stiffness or swelling Skin: Denies  skin rash, easy bruising or bleeding or hives Endoc:  Denies polyuria, polydipsia , polyphagia or weight change Psych:   Denies depression, insomnia or hallucinations  Other:  All other systems negative      MEDICATIONS: I have reviewed all medications and confirmed regimen as documented     IMAGING   CT chest 2019 No pneumonia No masses No acute findings    ASSESSMENT AND PLAN SYNOPSIS  58 year old with chronic progressive shortness of breath and chronic wheezing dyspnea and exertion with a history of extensive smoking history likely related to underlying reactive airways disease with possible unresolving pneumonia or interstitial lung disease  Obtain CT chest for further evaluation Patient was not deemed lung cancer screening referral candidate Continue inhalers as prescribed Continue albuterol as needed  Pulmonary function testing results relayed to patient   Chronic hypoxic respiratory failure with nocturnal hypoxia Continue oxygen as prescribed Patient uses and benefits from oxygen therapy  Most likely her symptoms are related to underlying deconditioned state and morbid obesity with reactive airways disease  Obesity -recommend significant weight loss -recommend changing diet  Deconditioned state -Recommend increased daily activity and exercise     COVID-19 EDUCATION: The signs and symptoms of COVID-19 were discussed with the patient and how to seek care for testing.  The importance of social distancing was discussed today. Hand Washing Techniques and avoid touching face was advised.  MEDICATION ADJUSTMENTS/LABS AND TESTS ORDERED: Obtain CT chest to assess lung fields Continue inhalers as prescribed   CURRENT MEDICATIONS REVIEWED AT LENGTH WITH PATIENT TODAY   Patient satisfied with Plan of action and management. All questions answered  Follow up in 3 months  Total time spent 22 minutes  Maretta Bees Patricia Pesa, M.D.  [facility]  Medical Director [place] Director [facility] Cardio-Pulmonary Department

## 2018-10-27 ENCOUNTER — Ambulatory Visit
Admission: RE | Admit: 2018-10-27 | Discharge: 2018-10-27 | Disposition: A | Discharge status: Home / Self Care | Payer: Medicare HMO | Source: Ambulatory Visit | Attending: Internal Medicine | Admitting: Internal Medicine

## 2018-10-27 ENCOUNTER — Other Ambulatory Visit: Payer: Self-pay

## 2018-10-27 DIAGNOSIS — Z1231 Encounter for screening mammogram for malignant neoplasm of breast: Secondary | ICD-10-CM | POA: Diagnosis not present

## 2018-10-30 ENCOUNTER — Ambulatory Visit: Payer: Medicare HMO

## 2018-10-31 ENCOUNTER — Ambulatory Visit: Payer: Medicare HMO

## 2018-11-02 ENCOUNTER — Ambulatory Visit
Admission: RE | Admit: 2018-11-02 | Discharge: 2018-11-02 | Disposition: A | Discharge status: Home / Self Care | Payer: Medicare HMO | Source: Ambulatory Visit | Attending: Internal Medicine | Admitting: Internal Medicine

## 2018-11-02 ENCOUNTER — Other Ambulatory Visit: Payer: Self-pay

## 2018-11-02 DIAGNOSIS — R0602 Shortness of breath: Secondary | ICD-10-CM | POA: Insufficient documentation

## 2018-11-02 DIAGNOSIS — R079 Chest pain, unspecified: Secondary | ICD-10-CM | POA: Diagnosis not present

## 2018-11-06 ENCOUNTER — Encounter: Payer: Self-pay | Admitting: Psychiatry

## 2018-11-06 ENCOUNTER — Other Ambulatory Visit: Payer: Self-pay

## 2018-11-06 ENCOUNTER — Ambulatory Visit (INDEPENDENT_AMBULATORY_CARE_PROVIDER_SITE_OTHER): Payer: Medicare HMO | Admitting: Psychiatry

## 2018-11-06 DIAGNOSIS — F4310 Post-traumatic stress disorder, unspecified: Secondary | ICD-10-CM | POA: Diagnosis not present

## 2018-11-06 DIAGNOSIS — F5105 Insomnia due to other mental disorder: Secondary | ICD-10-CM

## 2018-11-06 DIAGNOSIS — F251 Schizoaffective disorder, depressive type: Secondary | ICD-10-CM | POA: Diagnosis not present

## 2018-11-06 DIAGNOSIS — F411 Generalized anxiety disorder: Secondary | ICD-10-CM | POA: Diagnosis not present

## 2018-11-06 DIAGNOSIS — G2119 Other drug induced secondary parkinsonism: Secondary | ICD-10-CM

## 2018-11-06 DIAGNOSIS — E538 Deficiency of other specified B group vitamins: Secondary | ICD-10-CM | POA: Diagnosis not present

## 2018-11-06 MED ORDER — AMANTADINE HCL 100 MG PO CAPS: 100.0000 mg | ORAL_CAPSULE | Freq: Two times a day (BID) | ORAL | 0 refills | Status: DC

## 2018-11-06 MED ORDER — SERTRALINE HCL 50 MG PO TABS: 50.0000 mg | ORAL_TABLET | Freq: Every day | ORAL | 0 refills | Status: DC

## 2018-11-06 MED ORDER — OLANZAPINE 5 MG PO TABS: 2.5000 mg | ORAL_TABLET | Freq: Every day | ORAL | 0 refills | Status: DC

## 2018-11-06 NOTE — Progress Notes (Signed)
Virtual Visit via Telephone Note  I connected with Heather Haley on 11/06/18 at  3:00 PM EDT by telephone and verified that I am speaking with the correct person using two identifiers.   I discussed the limitations, risks, security and privacy concerns of performing an evaluation and management service by telephone and the availability of in person appointments. I also discussed with the patient that there may be a patient responsible charge related to this service. The patient expressed understanding and agreed to proceed.   I discussed the assessment and treatment plan with the patient. The patient was provided an opportunity to ask questions and all were answered. The patient agreed with the plan and demonstrated an understanding of the instructions.   The patient was advised to call back or seek an in-person evaluation if the symptoms worsen or if the condition fails to improve as anticipated.   [place] MD OP Progress Note  11/06/2018 5:33 PM Heather Haley  MRN:  [id number]  Chief Complaint:  Chief Complaint    Follow-up     HPI: Heather Haley is a 58 year old Caucasian female, divorced, lives in [place], has a history of schizoaffective disorder, PTSD, generalized anxiety disorder, drug-induced Parkinson's disease, memory problems, migraine headaches, diabetes melitis, chronic pain was evaluated by phone today.  Patient preferred to do a phone call.  Patient today reports she likes the effect of Zoloft.  She reports it is helpful with her anxiety symptoms.  She however continues to struggle with anxiety and would like her Zoloft to be readjusted today.  She reports she continues to hear voices however the voices are not distressing and she is able to cope with it.  She continues to take the olanzapine at bedtime.  She however reports she has some tremors of her bilateral hands as well as has the shakiness inside of her.  She was prescribed amantadine however not sure if she is  compliant with it.  She has tried Cogentin in the past however had side effects to it.  She also did not tolerate propranolol or artane.  Discussed readjusting the dosage of Zyprexa to see if that will help her with her tremors.  Also advised to talk to her neurologist.  Patient denies any other concerns today.   Visit Diagnosis:    ICD-10-CM   1. Schizoaffective disorder, depressive type ([place])  F25.1 amantadine (SYMMETREL) 100 MG capsule    sertraline (ZOLOFT) 50 MG tablet    OLANZapine (ZYPREXA) 5 MG tablet  2. PTSD (post-traumatic stress disorder)  F43.10   3. Generalized anxiety disorder  F41.1   4. Insomnia due to mental disorder  F51.05   5. Drug-induced Parkinsonism ([place])  G21.19     Past Psychiatric History: I have reviewed past psychiatric history from my progress note on 05/25/2017  Past Medical History:  Past Medical History:  Diagnosis Date  . Anxiety   . Asthma   . Chronic kidney disease   . Chronic pain    previously saw Dr. Consuela Mimes in pain clinic, then saw pain specialist in [place]  . Depression   . Diabetes mellitus ([place])   . Frequency of urination   . GERD (gastroesophageal reflux disease)   . Headache(784.0)   . High cholesterol   . Hypertension   . IBS (irritable bowel syndrome)   . Left ankle instability   . Left knee DJD   . Lumbar Degenerative Disc Disease of  10/11/2014  . Neuromuscular disorder ([place])   . Osteoarthritis of hip (  Right) 05/05/2015  . Other enthesopathy of ankle and tarsus 12/15/2009   Qualifier: Diagnosis of  By: Oneida Alar MD, KARL    . Parkinson's disease ([place])   . Peripheral sensory neuropathy (Bilateral) 11/19/2014  . Postoperative nausea and vomiting   . Schizophrenia [facility])     Past Surgical History:  Procedure Laterality Date  . ABDOMINAL HYSTERECTOMY    . ANKLE SURGERY    . APPENDECTOMY    . COLONOSCOPY  2013  . COLONOSCOPY WITH PROPOFOL N/A 04/25/2017   Procedure: COLONOSCOPY WITH PROPOFOL;  Surgeon: Manya Silvas, MD;   Location: [facility] ENDOSCOPY;  Service: Endoscopy;  Laterality: N/A;  . ESOPHAGOGASTRODUODENOSCOPY (EGD) WITH PROPOFOL N/A 10/03/2014   Procedure: ESOPHAGOGASTRODUODENOSCOPY (EGD) WITH PROPOFOL;  Surgeon: Josefine Class, MD;  Location: [facility] ENDOSCOPY;  Service: Endoscopy;  Laterality: N/A;  . ESOPHAGOGASTRODUODENOSCOPY (EGD) WITH PROPOFOL  04/25/2017   Procedure: ESOPHAGOGASTRODUODENOSCOPY (EGD) WITH PROPOFOL;  Surgeon: Manya Silvas, MD;  Location: [facility] ENDOSCOPY;  Service: Endoscopy;;  . KNEE ARTHROSCOPY  1997   left knee  . LEFT HEART CATH AND CORONARY ANGIOGRAPHY Left 11/10/2017   Procedure: LEFT HEART CATH AND CORONARY ANGIOGRAPHY;  Surgeon: Isaias Cowman, MD;  Location: [place] [facility] CV LAB;  Service: Cardiovascular;  Laterality: Left;  . LEFT HEART CATHETERIZATION WITH CORONARY ANGIOGRAM N/A 01/11/2013   Procedure: LEFT HEART CATHETERIZATION WITH CORONARY ANGIOGRAM;  Surgeon: Sinclair Grooms, MD;  Location: [facility] CATH LAB;  Service: Cardiovascular;  Laterality: N/A;  . TOTAL KNEE ARTHROPLASTY  08/30/2011   Procedure: TOTAL KNEE ARTHROPLASTY;  Surgeon: Lorn Junes, MD;  Location: [place];  Service: Orthopedics;  Laterality: Left;    Family Psychiatric History: I have reviewed family psychiatric history from my progress note on 05/25/2017  Family History:  Family History  Problem Relation Age of Onset  . Cancer Mother   . Hypertension Father   . Heart disease Father   . Alcohol abuse Father   . Breast cancer Neg Hx     Social History: I have reviewed social history from my progress note on 05/25/2017 Social History   Socioeconomic History  . Marital status: Divorced    Spouse name: Not on file  . Number of children: 2  . Years of education: 29  . Highest education level: 11th grade  Occupational History    Employer: [employer] Needs  . Financial resource strain: Somewhat hard  . Food insecurity    Worry: Never true    Inability: Never true  .  Transportation needs    Medical: No    Non-medical: No  Tobacco Use  . Smoking status: Former Smoker    Packs/day: 2.00    Years: 15.00    Pack years: 30.00    Types: Cigarettes    Quit date: 02/09/1995    Years since quitting: 23.7  . Smokeless tobacco: Never Used  Substance and Sexual Activity  . Alcohol use: No    Alcohol/week: 0.0 standard drinks  . Drug use: No  . Sexual activity: Not Currently  Lifestyle  . Physical activity    Days per week: 0 days    Minutes per session: 0 min  . Stress: Rather much  Relationships  . Social connections    Talks on phone: More than three times a week    Gets together: Once a week    Attends religious service: Never    Active member of club or organization: No    Attends meetings of clubs or organizations:  Never    Relationship status: Divorced  Other Topics Concern  . Not on file  Social History Narrative   Patient lives at home alone. Patient works at [employer].   Caffeine daily- 2   Right handed.   Education- 11 th grade    Allergies:  Allergies  Allergen Reactions  . Prolixin Decanoate [Fluphenazine]   . Benztropine Other (See Comments)    Hair fall out  Suicide thoughts  . Buprenorphine Hcl Other (See Comments)    Unable to void  . Carbidopa-Levodopa Nausea Only  . Codeine Hives    REACTION: hives  . Duloxetine Other (See Comments)    Altered mental status Alopecia, visual hallucinations, nightmares  . Gabapentin Swelling  . Lyrica [Pregabalin] Other (See Comments)    Alopecia, visual hallucinations, nightmares Altered mental status  . Morphine And Related Other (See Comments)    Unable to void  . Nortripytline Hcl [Nortriptyline] Other (See Comments)    Hair loss and night mares   . Nsaids     REACTION: palpitations, diaphoresis  . Penicillins Nausea And Vomiting and Other (See Comments)    REACTION: upset stomach Has patient had a PCN reaction causing immediate rash, facial/tongue/throat swelling, SOB  or lightheadedness with hypotension: No Has patient had a PCN reaction causing severe rash involving mucus membranes or skin necrosis: No Has patient had a PCN reaction that required hospitalization: No Has patient had a PCN reaction occurring within the last 10 years: No If all of the above answers are "NO", then may proceed with Cephalosporin use.  . Tolmetin Other (See Comments)    REACTION: palpitations, diaphoresis  . Wellbutrin [Bupropion]     Constipation, mood swings    Metabolic Disorder Labs: Lab Results  Component Value Date   HGBA1C 6.2 (H) 03/06/2017   MPG 131.24 03/06/2017   MPG 108 05/17/2015   Lab Results  Component Value Date   PROLACTIN 64.2 (H) 03/06/2017   Lab Results  Component Value Date   CHOL 146 08/22/2018   TRIG 172 (H) 08/22/2018   HDL 49 08/22/2018   CHOLHDL 3.0 08/22/2018   VLDL 34 08/22/2018   LDLCALC 63 08/22/2018   LDLCALC 59 03/06/2017   Lab Results  Component Value Date   TSH 0.959 08/22/2018   TSH 2.766 08/24/2017    Therapeutic Level Labs: No results found for: LITHIUM No results found for: VALPROATE No components found for:  CBMZ  Current Medications: Current Outpatient Medications  Medication Sig Dispense Refill  . acetaminophen (TYLENOL) 500 MG tablet Take 500 mg by mouth every 6 (six) hours as needed.    Marland Kitchen albuterol (PROVENTIL HFA;VENTOLIN HFA) 108 (90 Base) MCG/ACT inhaler Inhale 2 puffs into the lungs every 6 (six) hours as needed for wheezing or shortness of breath.    . Alcohol Swabs (B-D SINGLE USE SWABS REGULAR) PADS     . amantadine (SYMMETREL) 100 MG capsule Take 1 capsule (100 mg total) by mouth 2 (two) times daily. 180 capsule 0  . aspirin 81 MG tablet Take 81 mg by mouth daily.     . Blood Glucose Calibration (ACCU-CHEK AVIVA) SOLN     . Blood Glucose Monitoring Suppl (ACCU-CHEK AVIVA PLUS) w/Device KIT     . carbidopa-levodopa (SINEMET IR) 25-100 MG tablet Take 1 tablet by mouth 3 (three) times daily.    .  cetirizine (ZYRTEC) 10 MG tablet Take 10 mg by mouth daily.    . cholecalciferol (VITAMIN D) 1000 units tablet Take 1,000  Units by mouth daily.    . cyclobenzaprine (FLEXERIL) 10 MG tablet Take 10 mg by mouth 2 (two) times daily.     . cyclobenzaprine (FLEXERIL) 5 MG tablet     . diphenhydrAMINE (BENADRYL ALLERGY) 25 mg capsule Take 1 capsule (25 mg total) by mouth daily as needed. For side effects of tremors due to zyprexa 30 capsule 2  . docusate sodium (COLACE) 100 MG capsule Take 100 mg by mouth 2 (two) times daily.     Marland Kitchen glimepiride (AMARYL) 1 MG tablet Take 1 mg by mouth daily with breakfast.     . HYDROcodone-acetaminophen (NORCO/VICODIN) 5-325 MG tablet Take 0.5 tablets by mouth every 12 (twelve) hours as needed for moderate pain.    . hydroxypropyl methylcellulose / hypromellose (ISOPTO TEARS / GONIOVISC) 2.5 % ophthalmic solution Place 1 drop into both eyes every 4 (four) hours as needed for dry eyes.    Marland Kitchen losartan (COZAAR) 25 MG tablet Take 25 mg by mouth daily.    . metFORMIN (GLUCOPHAGE) 500 MG tablet Take 1 tablet (500 mg total) by mouth 2 (two) times daily with a meal. For diabetes management 10 tablet 0  . OLANZapine (ZYPREXA) 5 MG tablet Take 0.5 tablets (2.5 mg total) by mouth at bedtime. 90 tablet 0  . omeprazole (PRILOSEC) 40 MG capsule Take 40 mg by mouth daily as needed (heartburn).     . ondansetron (ZOFRAN) 4 MG tablet Take 4 mg by mouth every 8 (eight) hours as needed for nausea or vomiting.    . pravastatin (PRAVACHOL) 40 MG tablet Take 40 mg by mouth at bedtime.     . rizatriptan (MAXALT) 10 MG tablet Take 10 mg by mouth 2 (two) times daily. May repeat in 2 hours if needed     . rOPINIRole (REQUIP) 0.25 MG tablet Take 0.75 mg by mouth 3 (three) times daily.    Marland Kitchen senna (SENOKOT) 8.6 MG tablet Take 1-4 tablets (8.6-34.4 mg total) by mouth daily as needed for constipation. (Patient taking differently: Take 1 tablet by mouth at bedtime. )    . sertraline (ZOLOFT) 50 MG tablet  Take 1 tablet (50 mg total) by mouth daily with breakfast. 90 tablet 0  . topiramate (TOPAMAX) 100 MG tablet Take 0.5 tablets (50 mg total) by mouth at bedtime. For mood control 30 tablet 1  . traMADol (ULTRAM) 50 MG tablet Take 2 tablets (100 mg total) by mouth 2 (two) times daily for 15 days. 30 tablet 1  . verapamil (CALAN-SR) 120 MG CR tablet Take 1 tablet (120 mg total) by mouth at bedtime. For high blood pressure 90 tablet 1  . vitamin B-12 (CYANOCOBALAMIN) 1000 MCG tablet Take 1,000 mcg by mouth daily.     No current facility-administered medications for this visit.      Musculoskeletal: Strength & Muscle Tone: UTA Gait & Station: Reports as WNL Patient leans: N/A  Psychiatric Specialty Exam: Review of Systems  Neurological: Positive for tremors.  Psychiatric/Behavioral: The patient is nervous/anxious.   All other systems reviewed and are negative.   There were no vitals taken for this visit.There is no height or weight on file to calculate BMI.  General Appearance: UTA  Eye Contact:  UTA  Speech:  Clear and Coherent  Volume:  Normal  Mood:  Anxious  Affect:  UTA  Thought Process:  Goal Directed and Descriptions of Associations: Intact  Orientation:  Full (Time, Place, and Person)  Thought Content: Hallucinations: Auditory on and off , able  to cope   Suicidal Thoughts:  No  Homicidal Thoughts:  No  Memory:  Immediate;   Fair Recent;   Fair Remote;   Fair  Judgement:  Fair  Insight:  Fair  Psychomotor Activity:  Tremor  Concentration:  Concentration: Fair and Attention Span: Fair  Recall:  [employer] of Knowledge: Fair  Language: Fair  Akathisia:  No  Handed:  Right  AIMS (if indicated): Does report tremors  Assets:  Communication Skills Desire for Improvement  ADL's:  Intact  Cognition: WNL  Sleep:  Fair   Screenings: AIMS     Admission (Discharged) from OP Visit from 08/30/2018 in [place] Admission (Discharged) from 08/22/2018 in  [facility] Office Visit from 12/01/2017 in [place] Office Visit from 09/20/2017 in [place] Office Visit from 06/29/2017 in [place] Total Score  0  0  1  0  6    AUDIT     Admission (Discharged) from OP Visit from 08/30/2018 in [place] Admission (Discharged) from 08/22/2018 in [facility] Admission (Discharged) from 03/03/2017 in [place]  Alcohol Use Disorder Identification Test Final Score (AUDIT)  0  0  0    GAD-7     Office Visit from 08/22/2015 in [place] at [facility]  Total GAD-7 Score  6    PHQ2-9     Office Visit from 03/23/2016 in [place] Office Visit from 08/22/2015 in [place] at [place] from 08/06/2015 in [place] Office Visit from 06/10/2015 in [place] from 05/19/2015 in [place]  PHQ-2 Total Score  0  1  0  0  0       Assessment and Plan:Shandiin is a 58 year old Caucasian female who has a history of schizoaffective disorder, PTSD, migraine headaches, diabetes melitis, drug-induced Parkinson's disease was evaluated by phone today.  Patient continues to struggle with anxiety as well as tremors.  Patient will benefit from medication readjustment.  Plan Schizoaffective disorder-improving Increase Zoloft to 50 mg p.o. daily with breakfast Reduce Zyprexa to 2.5 mg p.o. nightly for side effects Amantadine 100 mg p.o. twice daily  For PTSD-stable we will continue to monitor closely.  GAD-improving Increase Zoloft to 50 mg p.o. daily  For neuroleptic induced Parkinson's disease-unstable Unable to do aims today since this is a  telephone visit.  Patient is unable to do a video call. She does report tremors. She reports she will also reach out to her neurologist. Continue amantadine. Reduce Zyprexa to see if that will help.  Follow-up in clinic in 4 weeks or sooner if needed.  October 26 at 3 PM  I have spent atleast 15 minutes non face to face with patient today. More than 50 % of the time was spent for psychoeducation and supportive psychotherapy and care coordination. This note was generated in part or whole with voice recognition software. Voice recognition is usually quite accurate but there are transcription errors that can and very often do occur. I apologize for any typographical errors that were not detected and corrected.       Ursula Alert, MD 11/06/2018, 5:33 PM

## 2018-11-07 ENCOUNTER — Telehealth: Payer: Self-pay | Admitting: Internal Medicine

## 2018-11-07 NOTE — Telephone Encounter (Signed)
Pt is aware and voiced her understanding.  Nothing further is needed.  

## 2018-11-07 NOTE — Telephone Encounter (Signed)
IMPRESSION: 1. No acute findings.  Lungs are clear. 2. No findings to account for left-sided chest pain and shortness of Breath.   NORMAL CT CHEST

## 2018-11-07 NOTE — Telephone Encounter (Signed)
Called and spoke to pt, who is requesting CT results from 11/03/2018.  DK please advise. Thanks

## 2018-11-13 ENCOUNTER — Telehealth: Payer: Self-pay

## 2018-11-13 NOTE — Telephone Encounter (Signed)
pt called states that the medication that you told her to cut in have she is doing the job . thank you .

## 2018-11-15 DIAGNOSIS — R0602 Shortness of breath: Secondary | ICD-10-CM | POA: Diagnosis not present

## 2018-11-15 DIAGNOSIS — J441 Chronic obstructive pulmonary disease with (acute) exacerbation: Secondary | ICD-10-CM | POA: Diagnosis not present

## 2018-11-15 DIAGNOSIS — J4540 Moderate persistent asthma, uncomplicated: Secondary | ICD-10-CM | POA: Diagnosis not present

## 2018-11-15 DIAGNOSIS — R0609 Other forms of dyspnea: Secondary | ICD-10-CM | POA: Diagnosis not present

## 2018-11-21 DIAGNOSIS — M25551 Pain in right hip: Secondary | ICD-10-CM | POA: Diagnosis not present

## 2018-11-21 DIAGNOSIS — M545 Low back pain: Secondary | ICD-10-CM | POA: Diagnosis not present

## 2018-11-21 DIAGNOSIS — M542 Cervicalgia: Secondary | ICD-10-CM | POA: Diagnosis not present

## 2018-12-04 ENCOUNTER — Other Ambulatory Visit: Payer: Self-pay

## 2018-12-04 ENCOUNTER — Ambulatory Visit (INDEPENDENT_AMBULATORY_CARE_PROVIDER_SITE_OTHER): Payer: Medicare HMO | Admitting: Psychiatry

## 2018-12-04 ENCOUNTER — Encounter: Payer: Self-pay | Admitting: Psychiatry

## 2018-12-04 DIAGNOSIS — F411 Generalized anxiety disorder: Secondary | ICD-10-CM | POA: Diagnosis not present

## 2018-12-04 DIAGNOSIS — F251 Schizoaffective disorder, depressive type: Secondary | ICD-10-CM

## 2018-12-04 DIAGNOSIS — F5105 Insomnia due to other mental disorder: Secondary | ICD-10-CM | POA: Diagnosis not present

## 2018-12-04 DIAGNOSIS — F4310 Post-traumatic stress disorder, unspecified: Secondary | ICD-10-CM | POA: Diagnosis not present

## 2018-12-04 DIAGNOSIS — G2119 Other drug induced secondary parkinsonism: Secondary | ICD-10-CM | POA: Diagnosis not present

## 2018-12-04 MED ORDER — SERTRALINE HCL 100 MG PO TABS: 100.0000 mg | ORAL_TABLET | Freq: Every day | ORAL | 0 refills | Status: DC

## 2018-12-04 NOTE — Progress Notes (Signed)
Virtual Visit via Telephone Note  I connected with Heather Haley on 12/04/18 at  3:00 PM EDT by telephone and verified that I am speaking with the correct person using two identifiers.   I discussed the limitations, risks, security and privacy concerns of performing an evaluation and management service by telephone and the availability of in person appointments. I also discussed with the patient that there may be a patient responsible charge related to this service. The patient expressed understanding and agreed to proceed.    I discussed the assessment and treatment plan with the patient. The patient was provided an opportunity to ask questions and all were answered. The patient agreed with the plan and demonstrated an understanding of the instructions.   The patient was advised to call back or seek an in-person evaluation if the symptoms worsen or if the condition fails to improve as anticipated.   [place] MD OP Progress Note  12/04/2018 5:21 PM [name]  MRN:  [id number]  Chief Complaint:  Chief Complaint    Follow-up     HPI: Heather Haley is a 58 year old Caucasian female, divorced, lives in [place], has a history of schizoaffective disorder, PTSD, GAD, drug-induced Parkinson's disease, migraine headaches, memory problems, diabetes melitis, chronic pain was evaluated by phone today.  Patient preferred to do a phone call.  She presented very late for the appointment today.  Patient today reports she continues to struggle with anxiety and depressive symptoms.  She reports she is worried about her daughter as well as is worried about the pandemic.  She reports she is compliant on Zoloft which helps.  Denies side effects.  She continues to hear voices all the voices are not distressing.  Patient is compliant on olanzapine low dosage.  Patient continues to struggle with bilateral tremors more on the left side-arms.  Likely due to olanzapine.  She has tried medications to help  with it however has developed side effects to medications and the only medication that she has so far tolerated for her tremors are the amantadine.  We will continue the same dosage.  Patient continues to struggle with some short-term memory loss however has been able to manage okay so far.  She will continue to work with her neurologist.  She does have sleep problems more so because of night Mares at night.  She has tried medication for nightmares in the past however did not tolerate it well.  She reports she is able to cope with it so far and does not want new medication changes at this time.  She has been referred for psychotherapy sessions in the past however has been noncompliant.  Patient denies any suicidality, homicidality.  Patient denies any other concerns today. Visit Diagnosis:    ICD-10-CM   1. Schizoaffective disorder, depressive type ([place])  F25.1 sertraline (ZOLOFT) 100 MG tablet  2. PTSD (post-traumatic stress disorder)  F43.10 sertraline (ZOLOFT) 100 MG tablet  3. Generalized anxiety disorder  F41.1 sertraline (ZOLOFT) 100 MG tablet  4. Insomnia due to mental disorder  F51.05   5. Drug-induced Parkinsonism ([place])  G21.19     Past Psychiatric History: I have reviewed past psychiatric history from my progress note on 05/25/2017.  Past Medical History:  Past Medical History:  Diagnosis Date  . Anxiety   . Asthma   . Chronic kidney disease   . Chronic pain    previously saw Dr. Consuela Mimes in pain clinic, then saw pain specialist in [place]  . Depression   .  Diabetes mellitus ([place])   . Frequency of urination   . GERD (gastroesophageal reflux disease)   . Headache(784.0)   . High cholesterol   . Hypertension   . IBS (irritable bowel syndrome)   . Left ankle instability   . Left knee DJD   . Lumbar Degenerative Disc Disease of  10/11/2014  . Neuromuscular disorder ([place])   . Osteoarthritis of hip (Right) 05/05/2015  . Other enthesopathy of ankle and tarsus 12/15/2009    Qualifier: Diagnosis of  By: Oneida Alar MD, KARL    . Parkinson's disease ([place])   . Peripheral sensory neuropathy (Bilateral) 11/19/2014  . Postoperative nausea and vomiting   . Schizophrenia [facility])     Past Surgical History:  Procedure Laterality Date  . ABDOMINAL HYSTERECTOMY    . ANKLE SURGERY    . APPENDECTOMY    . COLONOSCOPY  2013  . COLONOSCOPY WITH PROPOFOL N/A 04/25/2017   Procedure: COLONOSCOPY WITH PROPOFOL;  Surgeon: Manya Silvas, MD;  Location: [facility] ENDOSCOPY;  Service: Endoscopy;  Laterality: N/A;  . ESOPHAGOGASTRODUODENOSCOPY (EGD) WITH PROPOFOL N/A 10/03/2014   Procedure: ESOPHAGOGASTRODUODENOSCOPY (EGD) WITH PROPOFOL;  Surgeon: Josefine Class, MD;  Location: [facility] ENDOSCOPY;  Service: Endoscopy;  Laterality: N/A;  . ESOPHAGOGASTRODUODENOSCOPY (EGD) WITH PROPOFOL  04/25/2017   Procedure: ESOPHAGOGASTRODUODENOSCOPY (EGD) WITH PROPOFOL;  Surgeon: Manya Silvas, MD;  Location: [facility] ENDOSCOPY;  Service: Endoscopy;;  . KNEE ARTHROSCOPY  1997   left knee  . LEFT HEART CATH AND CORONARY ANGIOGRAPHY Left 11/10/2017   Procedure: LEFT HEART CATH AND CORONARY ANGIOGRAPHY;  Surgeon: Isaias Cowman, MD;  Location: [facility] CV LAB;  Service: Cardiovascular;  Laterality: Left;  . LEFT HEART CATHETERIZATION WITH CORONARY ANGIOGRAM N/A 01/11/2013   Procedure: LEFT HEART CATHETERIZATION WITH CORONARY ANGIOGRAM;  Surgeon: Sinclair Grooms, MD;  Location: [facility] CATH LAB;  Service: Cardiovascular;  Laterality: N/A;  . TOTAL KNEE ARTHROPLASTY  08/30/2011   Procedure: TOTAL KNEE ARTHROPLASTY;  Surgeon: Lorn Junes, MD;  Location: [facility];  Service: Orthopedics;  Laterality: Left;    Family Psychiatric History: I have reviewed family psychiatric history from my progress note on 05/25/2017. Family History:  Family History  Problem Relation Age of Onset  . Cancer Mother   . Hypertension Father   . Heart disease Father   . Alcohol abuse Father   . Breast cancer Neg Hx      Social History: I have reviewed social history from my progress note on 05/25/2017. Social History   Socioeconomic History  . Marital status: Divorced    Spouse name: Not on file  . Number of children: 2  . Years of education: 77  . Highest education level: 11th grade  Occupational History    Employer: [employer]  . Financial resource strain: Somewhat hard  . Food insecurity    Worry: Never true    Inability: Never true  . Transportation needs    Medical: No    Non-medical: No  Tobacco Use  . Smoking status: Former Smoker    Packs/day: 2.00    Years: 15.00    Pack years: 30.00    Types: Cigarettes    Quit date: 02/09/1995    Years since quitting: 23.8  . Smokeless tobacco: Never Used  Substance and Sexual Activity  . Alcohol use: No    Alcohol/week: 0.0 standard drinks  . Drug use: No  . Sexual activity: Not Currently  Lifestyle  . Physical activity    Days per  week: 0 days    Minutes per session: 0 min  . Stress: Rather much  Relationships  . Social connections    Talks on phone: More than three times a week    Gets together: Once a week    Attends religious service: Never    Active member of club or organization: No    Attends meetings of clubs or organizations: Never    Relationship status: Divorced  Other Topics Concern  . Not on file  Social History Narrative   Patient lives at home alone. Patient works at [employer].   Caffeine daily- 2   Right handed.   Education- 11 th grade    Allergies:  Allergies  Allergen Reactions  . Prolixin Decanoate [Fluphenazine]   . Benztropine Other (See Comments)    Hair fall out  Suicide thoughts  . Buprenorphine Hcl Other (See Comments)    Unable to void  . Carbidopa-Levodopa Nausea Only  . Codeine Hives    REACTION: hives  . Duloxetine Other (See Comments)    Altered mental status Alopecia, visual hallucinations, nightmares  . Gabapentin Swelling  . Lyrica [Pregabalin] Other (See  Comments)    Alopecia, visual hallucinations, nightmares Altered mental status  . Morphine And Related Other (See Comments)    Unable to void  . Nortripytline Hcl [Nortriptyline] Other (See Comments)    Hair loss and night mares   . Nsaids     REACTION: palpitations, diaphoresis  . Penicillins Nausea And Vomiting and Other (See Comments)    REACTION: upset stomach Has patient had a PCN reaction causing immediate rash, facial/tongue/throat swelling, SOB or lightheadedness with hypotension: No Has patient had a PCN reaction causing severe rash involving mucus membranes or skin necrosis: No Has patient had a PCN reaction that required hospitalization: No Has patient had a PCN reaction occurring within the last 10 years: No If all of the above answers are "NO", then may proceed with Cephalosporin use.  . Tolmetin Other (See Comments)    REACTION: palpitations, diaphoresis  . Wellbutrin [Bupropion]     Constipation, mood swings    Metabolic Disorder Labs: Lab Results  Component Value Date   HGBA1C 6.2 (H) 03/06/2017   MPG 131.24 03/06/2017   MPG 108 05/17/2015   Lab Results  Component Value Date   PROLACTIN 64.2 (H) 03/06/2017   Lab Results  Component Value Date   CHOL 146 08/22/2018   TRIG 172 (H) 08/22/2018   HDL 49 08/22/2018   CHOLHDL 3.0 08/22/2018   VLDL 34 08/22/2018   LDLCALC 63 08/22/2018   LDLCALC 59 03/06/2017   Lab Results  Component Value Date   TSH 0.959 08/22/2018   TSH 2.766 08/24/2017    Therapeutic Level Labs: No results found for: LITHIUM No results found for: VALPROATE No components found for:  CBMZ  Current Medications: Current Outpatient Medications  Medication Sig Dispense Refill  . montelukast (SINGULAIR) 10 MG tablet Take by mouth.    Marland Kitchen acetaminophen (TYLENOL) 500 MG tablet Take 500 mg by mouth every 6 (six) hours as needed.    Marland Kitchen albuterol (PROVENTIL HFA;VENTOLIN HFA) 108 (90 Base) MCG/ACT inhaler Inhale 2 puffs into the lungs every 6  (six) hours as needed for wheezing or shortness of breath.    . Alcohol Swabs (B-D SINGLE USE SWABS REGULAR) PADS     . amantadine (SYMMETREL) 100 MG capsule Take 1 capsule (100 mg total) by mouth 2 (two) times daily. 180 capsule 0  . aspirin 81  MG tablet Take 81 mg by mouth daily.     . Blood Glucose Calibration (ACCU-CHEK AVIVA) SOLN     . Blood Glucose Monitoring Suppl (ACCU-CHEK AVIVA PLUS) w/Device KIT     . carbidopa-levodopa (SINEMET IR) 25-100 MG tablet Take 1 tablet by mouth 3 (three) times daily.    . cetirizine (ZYRTEC) 10 MG tablet Take 10 mg by mouth daily.    . cholecalciferol (VITAMIN D) 1000 units tablet Take 1,000 Units by mouth daily.    . cyclobenzaprine (FLEXERIL) 10 MG tablet Take 10 mg by mouth 2 (two) times daily.     . cyclobenzaprine (FLEXERIL) 5 MG tablet     . diphenhydrAMINE (BENADRYL ALLERGY) 25 mg capsule Take 1 capsule (25 mg total) by mouth daily as needed. For side effects of tremors due to zyprexa 30 capsule 2  . docusate sodium (COLACE) 100 MG capsule Take 100 mg by mouth 2 (two) times daily.     Marland Kitchen glimepiride (AMARYL) 1 MG tablet Take 1 mg by mouth daily with breakfast.     . HYDROcodone-acetaminophen (NORCO/VICODIN) 5-325 MG tablet Take 0.5 tablets by mouth every 12 (twelve) hours as needed for moderate pain.    . hydroxypropyl methylcellulose / hypromellose (ISOPTO TEARS / GONIOVISC) 2.5 % ophthalmic solution Place 1 drop into both eyes every 4 (four) hours as needed for dry eyes.    Marland Kitchen losartan (COZAAR) 25 MG tablet Take 25 mg by mouth daily.    . metFORMIN (GLUCOPHAGE) 500 MG tablet Take 1 tablet (500 mg total) by mouth 2 (two) times daily with a meal. For diabetes management 10 tablet 0  . OLANZapine (ZYPREXA) 5 MG tablet Take 0.5 tablets (2.5 mg total) by mouth at bedtime. 90 tablet 0  . omeprazole (PRILOSEC) 40 MG capsule Take 40 mg by mouth daily as needed (heartburn).     . ondansetron (ZOFRAN) 4 MG tablet Take 4 mg by mouth every 8 (eight) hours as  needed for nausea or vomiting.    . pravastatin (PRAVACHOL) 40 MG tablet Take 40 mg by mouth at bedtime.     . rizatriptan (MAXALT) 10 MG tablet Take 10 mg by mouth 2 (two) times daily. May repeat in 2 hours if needed     . rOPINIRole (REQUIP XL) 4 MG 24 hr tablet     . rOPINIRole (REQUIP) 0.25 MG tablet Take 0.75 mg by mouth 3 (three) times daily.    Marland Kitchen senna (SENOKOT) 8.6 MG tablet Take 1-4 tablets (8.6-34.4 mg total) by mouth daily as needed for constipation. (Patient taking differently: Take 1 tablet by mouth at bedtime. )    . sertraline (ZOLOFT) 100 MG tablet Take 1 tablet (100 mg total) by mouth daily. 90 tablet 0  . topiramate (TOPAMAX) 100 MG tablet Take 0.5 tablets (50 mg total) by mouth at bedtime. For mood control 30 tablet 1  . traMADol (ULTRAM) 50 MG tablet Take 2 tablets (100 mg total) by mouth 2 (two) times daily for 15 days. 30 tablet 1  . verapamil (CALAN-SR) 120 MG CR tablet Take 1 tablet (120 mg total) by mouth at bedtime. For high blood pressure 90 tablet 1  . vitamin B-12 (CYANOCOBALAMIN) 1000 MCG tablet Take 1,000 mcg by mouth daily.     No current facility-administered medications for this visit.      Musculoskeletal: Strength & Muscle Tone: UTA Gait & Station: Reports as WNL Patient leans: N/A  Psychiatric Specialty Exam: Review of Systems  Psychiatric/Behavioral: The patient is  nervous/anxious and has insomnia.   All other systems reviewed and are negative.   There were no vitals taken for this visit.There is no height or weight on file to calculate BMI.  General Appearance: UTA  Eye Contact:  UTA  Speech:  Normal Rate  Volume:  Normal  Mood:  Anxious  Affect:  UTA  Thought Process:  Goal Directed and Descriptions of Associations: Intact  Orientation:  Full (Time, Place, and Person)  Thought Content: Hallucinations: Auditory able to cope   Suicidal Thoughts:  No  Homicidal Thoughts:  No  Memory:  Immediate;   Fair Recent;   Fair Remote;   Fair   Judgement:  Fair  Insight:  Fair  Psychomotor Activity:  Does have tremors more on left side - Arms  Concentration:  Concentration: Fair and Attention Span: Fair  Recall:  [employer] of Knowledge: Fair  Language: Fair  Akathisia:  No  Handed:  Right  AIMS (if indicated): reports has tremors.  Assets:  Communication Skills Desire for Improvement Housing Social Support  ADL's:  Intact  Cognition: WNL  Sleep:  restless nightmares   Screenings: AIMS     Admission (Discharged) from OP Visit from 08/30/2018 in [facility] Admission (Discharged) from 08/22/2018 in [facility] Office Visit from 12/01/2017 in [facility] Office Visit from 09/20/2017 in [facility] Office Visit from 06/29/2017 in [facility] Total Score  0  0  1  0  6    AUDIT     Admission (Discharged) from OP Visit from 08/30/2018 in [facility] Admission (Discharged) from 08/22/2018 in [facility] Admission (Discharged) from 03/03/2017 in [facility]  Alcohol Use Disorder Identification Test Final Score (AUDIT)  0  0  0    GAD-7     Office Visit from 08/22/2015 in [facility]  Total GAD-7 Score  6    PHQ2-9     Office Visit from 03/23/2016 in [facility] Office Visit from 08/22/2015 in [facility] [place] from 08/06/2015 in [facility] Office Visit from 06/10/2015 in [facility] Clinical Support from 05/19/2015 in [facility]  PHQ-2 Total Score  0  1  0  0  0       Assessment and Plan: Shakedra is a 58 year old Caucasian female who has a history of schizoaffective disorder, PTSD, migraine  headaches, diabetes melitis, drug-induced Parkinson's disease was evaluated by phone today.  Patient continues to struggle with anxiety, hallucinations as well as tremors and will benefit from medication readjustment.  Plan Schizoaffective disorder-some progress Increase Zoloft to 100 mg p.o. daily with breakfast Continue Zyprexa at reduced dosage of 2.5 mg p.o. nightly Continue amantadine 100 mg p.o. twice daily  PTSD-patient does report some nightmares.  We will continue to monitor. Zoloft 100 mg p.o. daily  GAD-improving Zoloft increased to 100 mg p.o. daily  Neuroleptic induced Parkinson's disease-unstable Discussed with patient to continue to work with her neurologist. Continue amantadine Zyprexa dosage has been reduced.  Follow-up in clinic in 3 to 4 weeks or sooner if needed.  November 24 at 3 PM.  I have spent atleast 15 minutes non face to face with patient today. More than 50 % of the time was spent for  psychoeducation and supportive psychotherapy and care coordination. This note was generated in part or whole with voice recognition software. Voice recognition is usually quite accurate but there are transcription errors that can and very often do occur. I apologize for any typographical errors that were not detected and corrected.       Ursula Alert, MD 12/04/2018, 5:21 PM

## 2018-12-07 DIAGNOSIS — M545 Low back pain: Secondary | ICD-10-CM | POA: Diagnosis not present

## 2018-12-07 DIAGNOSIS — Z96652 Presence of left artificial knee joint: Secondary | ICD-10-CM | POA: Diagnosis not present

## 2018-12-07 DIAGNOSIS — M7651 Patellar tendinitis, right knee: Secondary | ICD-10-CM | POA: Diagnosis not present

## 2018-12-07 DIAGNOSIS — M1711 Unilateral primary osteoarthritis, right knee: Secondary | ICD-10-CM | POA: Diagnosis not present

## 2018-12-12 DIAGNOSIS — E538 Deficiency of other specified B group vitamins: Secondary | ICD-10-CM | POA: Diagnosis not present

## 2018-12-16 DIAGNOSIS — J441 Chronic obstructive pulmonary disease with (acute) exacerbation: Secondary | ICD-10-CM | POA: Diagnosis not present

## 2018-12-16 DIAGNOSIS — R0602 Shortness of breath: Secondary | ICD-10-CM | POA: Diagnosis not present

## 2018-12-16 DIAGNOSIS — R0609 Other forms of dyspnea: Secondary | ICD-10-CM | POA: Diagnosis not present

## 2018-12-16 DIAGNOSIS — J4540 Moderate persistent asthma, uncomplicated: Secondary | ICD-10-CM | POA: Diagnosis not present

## 2018-12-20 DIAGNOSIS — M5481 Occipital neuralgia: Secondary | ICD-10-CM | POA: Diagnosis not present

## 2018-12-20 DIAGNOSIS — M542 Cervicalgia: Secondary | ICD-10-CM | POA: Diagnosis not present

## 2018-12-26 DIAGNOSIS — M79605 Pain in left leg: Secondary | ICD-10-CM | POA: Diagnosis not present

## 2018-12-28 DIAGNOSIS — S83281A Other tear of lateral meniscus, current injury, right knee, initial encounter: Secondary | ICD-10-CM | POA: Diagnosis not present

## 2019-01-02 ENCOUNTER — Other Ambulatory Visit: Payer: Self-pay

## 2019-01-02 ENCOUNTER — Ambulatory Visit (INDEPENDENT_AMBULATORY_CARE_PROVIDER_SITE_OTHER): Payer: Self-pay | Admitting: Psychiatry

## 2019-01-02 DIAGNOSIS — I10 Essential (primary) hypertension: Secondary | ICD-10-CM | POA: Diagnosis not present

## 2019-01-02 DIAGNOSIS — R8290 Unspecified abnormal findings in urine: Secondary | ICD-10-CM | POA: Diagnosis not present

## 2019-01-02 DIAGNOSIS — N343 Urethral syndrome, unspecified: Secondary | ICD-10-CM | POA: Diagnosis not present

## 2019-01-02 DIAGNOSIS — Z6841 Body Mass Index (BMI) 40.0 and over, adult: Secondary | ICD-10-CM | POA: Diagnosis not present

## 2019-01-02 DIAGNOSIS — E1165 Type 2 diabetes mellitus with hyperglycemia: Secondary | ICD-10-CM | POA: Diagnosis not present

## 2019-01-02 DIAGNOSIS — Z5329 Procedure and treatment not carried out because of patient's decision for other reasons: Secondary | ICD-10-CM

## 2019-01-02 DIAGNOSIS — M25561 Pain in right knee: Secondary | ICD-10-CM | POA: Diagnosis not present

## 2019-01-02 DIAGNOSIS — R3915 Urgency of urination: Secondary | ICD-10-CM | POA: Diagnosis not present

## 2019-01-02 DIAGNOSIS — E119 Type 2 diabetes mellitus without complications: Secondary | ICD-10-CM | POA: Diagnosis not present

## 2019-01-02 DIAGNOSIS — Z91199 Patient's noncompliance with other medical treatment and regimen due to unspecified reason: Secondary | ICD-10-CM | POA: Insufficient documentation

## 2019-01-02 NOTE — Progress Notes (Signed)
No response to call or text. 

## 2019-01-03 ENCOUNTER — Encounter: Payer: Self-pay | Admitting: Psychiatry

## 2019-01-03 ENCOUNTER — Ambulatory Visit (INDEPENDENT_AMBULATORY_CARE_PROVIDER_SITE_OTHER): Payer: Medicare HMO | Admitting: Psychiatry

## 2019-01-03 DIAGNOSIS — F5105 Insomnia due to other mental disorder: Secondary | ICD-10-CM

## 2019-01-03 DIAGNOSIS — F251 Schizoaffective disorder, depressive type: Secondary | ICD-10-CM | POA: Diagnosis not present

## 2019-01-03 DIAGNOSIS — F411 Generalized anxiety disorder: Secondary | ICD-10-CM

## 2019-01-03 DIAGNOSIS — G2119 Other drug induced secondary parkinsonism: Secondary | ICD-10-CM

## 2019-01-03 DIAGNOSIS — F4310 Post-traumatic stress disorder, unspecified: Secondary | ICD-10-CM | POA: Diagnosis not present

## 2019-01-03 MED ORDER — SERTRALINE HCL 100 MG PO TABS: 150.0000 mg | ORAL_TABLET | Freq: Every day | ORAL | 0 refills | Status: DC

## 2019-01-03 NOTE — Progress Notes (Signed)
Virtual Visit via Telephone Note  I connected with Heather Haley on 01/03/19 at  1:45 PM EST by telephone and verified that I am speaking with the correct person using two identifiers.   I discussed the limitations, risks, security and privacy concerns of performing an evaluation and management service by telephone and the availability of in person appointments. I also discussed with the patient that there may be a patient responsible charge related to this service. The patient expressed understanding and agreed to proceed.     I discussed the assessment and treatment plan with the patient. The patient was provided an opportunity to ask questions and all were answered. The patient agreed with the plan and demonstrated an understanding of the instructions.   The patient was advised to call back or seek an in-person evaluation if the symptoms worsen or if the condition fails to improve as anticipated.   [place] MD OP Progress Note  01/03/2019 2:37 PM AMARE KONTOS  MRN:  [id number]  Chief Complaint:  Chief Complaint    Follow-up     HPI: Heather Haley is a 58 year old Caucasian female, lives in [place], has a history of schizoaffective disorder, PTSD, GAD, drug-induced Parkinson's disease, migraine headaches, memory problems, diabetes melitis, chronic pain was evaluated by phone today.  Patient preferred to do a phone call.  Patient had missed her appointment yesterday and apologized for the same.  Patient today reports she is currently making progress with regards to her anxiety however continues to struggle on and off.  The Zoloft does help however she is interested in a dosage increase.  She denies side effects to the Zoloft.  She continues to hear voices which are chronic and reports she is able to cope with them and they are not distressing.  She reports sleep is improving.  Patient denies any suicidality, homicidality or perceptual disturbances.  Patient does report headaches  , chronic , she will continue to work with her providers on the same.  Patient denies any other concerns today. Visit Diagnosis:    ICD-10-CM   1. Schizoaffective disorder, depressive type ([place])  F25.1 sertraline (ZOLOFT) 100 MG tablet   stable  2. PTSD (post-traumatic stress disorder)  F43.10 sertraline (ZOLOFT) 100 MG tablet  3. Generalized anxiety disorder  F41.1 sertraline (ZOLOFT) 100 MG tablet  4. Insomnia due to mental disorder  F51.05   5. Drug-induced Parkinsonism ([place])  G21.19     Past Psychiatric History: I have reviewed past psychiatric history from my progress note on 05/25/2017.  Past Medical History:  Past Medical History:  Diagnosis Date  . Anxiety   . Asthma   . Chronic kidney disease   . Chronic pain    previously saw Dr. Consuela Mimes in pain clinic, then saw pain specialist in [place]  . Depression   . Diabetes mellitus ([place])   . Frequency of urination   . GERD (gastroesophageal reflux disease)   . Headache(784.0)   . High cholesterol   . Hypertension   . IBS (irritable bowel syndrome)   . Left ankle instability   . Left knee DJD   . Lumbar Degenerative Disc Disease of  10/11/2014  . Neuromuscular disorder ([place])   . Osteoarthritis of hip (Right) 05/05/2015  . Other enthesopathy of ankle and tarsus 12/15/2009   Qualifier: Diagnosis of  By: Oneida Alar MD, KARL    . Parkinson's disease ([place])   . Peripheral sensory neuropathy (Bilateral) 11/19/2014  . Postoperative nausea and vomiting   . Schizophrenia ([place])  Past Surgical History:  Procedure Laterality Date  . ABDOMINAL HYSTERECTOMY    . ANKLE SURGERY    . APPENDECTOMY    . COLONOSCOPY  2013  . COLONOSCOPY WITH PROPOFOL N/A 04/25/2017   Procedure: COLONOSCOPY WITH PROPOFOL;  Surgeon: Manya Silvas, MD;  Location: [facility] ENDOSCOPY;  Service: Endoscopy;  Laterality: N/A;  . ESOPHAGOGASTRODUODENOSCOPY (EGD) WITH PROPOFOL N/A 10/03/2014   Procedure: ESOPHAGOGASTRODUODENOSCOPY (EGD) WITH PROPOFOL;  Surgeon:  Josefine Class, MD;  Location[facility] END[facility]COPY;  Service: Endoscopy;  Laterality: N/A;  . ESOPHAGOGASTRODUODENOSCOPY (EGD) WITH PROPOFOL  04/25/2017   Procedure: ESOPHAGOGASTRODUODENOSCOPY (EGD) WITH PROPOFOL;  Surgeon: Manya Silvas, MD;  Location: [facility] ENDOSCOPY;  Service: Endoscopy;;  . KNEE ARTHROSCOPY  1997   left knee  . LEFT HEART CATH AND CORONARY ANGIOGRAPHY Left 11/10/2017   Procedure: LEFT HEART CATH AND CORONARY ANGIOGRAPHY;  Surgeon: Isaias Cowman, MD;  Location: [facility] CV LAB;  Service: Cardiovascular;  Laterality: Left;  . LEFT HEART CATHETERIZATION WITH CORONARY ANGIOGRAM N/A 01/11/2013   Procedure: LEFT HEART CATHETERIZATION WITH CORONARY ANGIOGRAM;  Surgeon: Sinclair Grooms, MD;  Location: [facility] CATH LAB;  Service: Cardiovascular;  Laterality: N/A;  . TOTAL KNEE ARTHROPLASTY  08/30/2011   Procedure: TOTAL KNEE ARTHROPLASTY;  Surgeon: Lorn Junes, MD;  Location: [facility];  Service: Orthopedics;  Laterality: Left;    Family Psychiatric History: I have reviewed family psychiatric history from my progress note on 05/25/2017.  Family History:  Family History  Problem Relation Age of Onset  . Cancer Mother   . Hypertension Father   . Heart disease Father   . Alcohol abuse Father   . Breast cancer Neg Hx     Social History: Reviewed social history from my progress note on 05/25/2017. Social History   Socioeconomic History  . Marital status: Divorced    Spouse name: Not on file  . Number of children: 2  . Years of education: 25  . Highest education level: 11th grade  Occupational History    Employer: [employer]  . Financial resource strain: Somewhat hard  . Food insecurity    Worry: Never true    Inability: Never true  . Transportation needs    Medical: No    Non-medical: No  Tobacco Use  . Smoking status: Former Smoker    Packs/day: 2.00    Years: 15.00    Pack years: 30.00    Types: Cigarettes    Quit date: 02/09/1995     Years since quitting: 23.9  . Smokeless tobacco: Never Used  Substance and Sexual Activity  . Alcohol use: No    Alcohol/week: 0.0 standard drinks  . Drug use: No  . Sexual activity: Not Currently  Lifestyle  . Physical activity    Days per week: 0 days    Minutes per session: 0 min  . Stress: Rather much  Relationships  . Social connections    Talks on phone: More than three times a week    Gets together: Once a week    Attends religious service: Never    Active member of club or organization: No    Attends meetings of clubs or organizations: Never    Relationship status: Divorced  Other Topics Concern  . Not on file  Social History Narrative   Patient lives at home alone. Patient works at [employer].   Caffeine daily- 2   Right handed.   Education- 11 th grade    Allergies:  Allergies  Allergen Reactions  . Prolixin Decanoate [Fluphenazine]   . Benztropine Other (See Comments)    Hair fall out  Suicide thoughts  . Buprenorphine Hcl Other (See Comments)    Unable to void  . Carbidopa-Levodopa Nausea Only  . Codeine Hives    REACTION: hives  . Duloxetine Other (See Comments)    Altered mental status Alopecia, visual hallucinations, nightmares  . Gabapentin Swelling  . Lyrica [Pregabalin] Other (See Comments)    Alopecia, visual hallucinations, nightmares Altered mental status  . Morphine And Related Other (See Comments)    Unable to void  . Nortripytline Hcl [Nortriptyline] Other (See Comments)    Hair loss and night mares   . Nsaids     REACTION: palpitations, diaphoresis  . Penicillins Nausea And Vomiting and Other (See Comments)    REACTION: upset stomach Has patient had a PCN reaction causing immediate rash, facial/tongue/throat swelling, SOB or lightheadedness with hypotension: No Has patient had a PCN reaction causing severe rash involving mucus membranes or skin necrosis: No Has patient had a PCN reaction that required hospitalization: No Has  patient had a PCN reaction occurring within the last 10 years: No If all of the above answers are "NO", then may proceed with Cephalosporin use.  . Tolmetin Other (See Comments)    REACTION: palpitations, diaphoresis  . Wellbutrin [Bupropion]     Constipation, mood swings    Metabolic Disorder Labs: Lab Results  Component Value Date   HGBA1C 6.2 (H) 03/06/2017   MPG 131.24 03/06/2017   MPG 108 05/17/2015   Lab Results  Component Value Date   PROLACTIN 64.2 (H) 03/06/2017   Lab Results  Component Value Date   CHOL 146 08/22/2018   TRIG 172 (H) 08/22/2018   HDL 49 08/22/2018   CHOLHDL 3.0 08/22/2018   VLDL 34 08/22/2018   LDLCALC 63 08/22/2018   LDLCALC 59 03/06/2017   Lab Results  Component Value Date   TSH 0.959 08/22/2018   TSH 2.766 08/24/2017    Therapeutic Level Labs: No results found for: LITHIUM No results found for: VALPROATE No components found for:  CBMZ  Current Medications: Current Outpatient Medications  Medication Sig Dispense Refill  . acetaminophen (TYLENOL) 500 MG tablet Take 500 mg by mouth every 6 (six) hours as needed.    Marland Kitchen albuterol (PROVENTIL HFA;VENTOLIN HFA) 108 (90 Base) MCG/ACT inhaler Inhale 2 puffs into the lungs every 6 (six) hours as needed for wheezing or shortness of breath.    . Alcohol Swabs (B-D SINGLE USE SWABS REGULAR) PADS     . amantadine (SYMMETREL) 100 MG capsule Take 1 capsule (100 mg total) by mouth 2 (two) times daily. 180 capsule 0  . aspirin 81 MG tablet Take 81 mg by mouth daily.     . Blood Glucose Calibration (ACCU-CHEK AVIVA) SOLN     . Blood Glucose Monitoring Suppl (ACCU-CHEK AVIVA PLUS) w/Device KIT     . carbidopa-levodopa (SINEMET IR) 25-100 MG tablet Take 1 tablet by mouth 3 (three) times daily.    . cetirizine (ZYRTEC) 10 MG tablet Take 10 mg by mouth daily.    . cholecalciferol (VITAMIN D) 1000 units tablet Take 1,000 Units by mouth daily.    . cyclobenzaprine (FLEXERIL) 10 MG tablet Take 10 mg by mouth 2  (two) times daily.     . cyclobenzaprine (FLEXERIL) 5 MG tablet     . diphenhydrAMINE (BENADRYL ALLERGY) 25 mg capsule Take 1 capsule (25 mg total) by mouth daily as needed.  For side effects of tremors due to zyprexa 30 capsule 2  . docusate sodium (COLACE) 100 MG capsule Take 100 mg by mouth 2 (two) times daily.     Marland Kitchen glimepiride (AMARYL) 1 MG tablet Take 1 mg by mouth daily with breakfast.     . HYDROcodone-acetaminophen (NORCO/VICODIN) 5-325 MG tablet Take 0.5 tablets by mouth every 12 (twelve) hours as needed for moderate pain.    . hydroxypropyl methylcellulose / hypromellose (ISOPTO TEARS / GONIOVISC) 2.5 % ophthalmic solution Place 1 drop into both eyes every 4 (four) hours as needed for dry eyes.    Marland Kitchen losartan (COZAAR) 25 MG tablet Take 25 mg by mouth daily.    . metFORMIN (GLUCOPHAGE) 500 MG tablet Take 1 tablet (500 mg total) by mouth 2 (two) times daily with a meal. For diabetes management 10 tablet 0  . montelukast (SINGULAIR) 10 MG tablet Take by mouth.    . OLANZapine (ZYPREXA) 5 MG tablet Take 0.5 tablets (2.5 mg total) by mouth at bedtime. 90 tablet 0  . omeprazole (PRILOSEC) 40 MG capsule Take 40 mg by mouth daily as needed (heartburn).     . ondansetron (ZOFRAN) 4 MG tablet Take 4 mg by mouth every 8 (eight) hours as needed for nausea or vomiting.    . pravastatin (PRAVACHOL) 40 MG tablet Take 40 mg by mouth at bedtime.     . rizatriptan (MAXALT) 10 MG tablet Take 10 mg by mouth 2 (two) times daily. May repeat in 2 hours if needed     . rOPINIRole (REQUIP XL) 4 MG 24 hr tablet     . rOPINIRole (REQUIP) 0.25 MG tablet Take 0.75 mg by mouth 3 (three) times daily.    Marland Kitchen senna (SENOKOT) 8.6 MG tablet Take 1-4 tablets (8.6-34.4 mg total) by mouth daily as needed for constipation. (Patient taking differently: Take 1 tablet by mouth at bedtime. )    . sertraline (ZOLOFT) 100 MG tablet Take 1.5 tablets (150 mg total) by mouth daily. PATIENT HAS SUPPLIES FOR NOW 135 tablet 0  . topiramate  (TOPAMAX) 100 MG tablet Take 0.5 tablets (50 mg total) by mouth at bedtime. For mood control 30 tablet 1  . traMADol (ULTRAM) 50 MG tablet Take 2 tablets (100 mg total) by mouth 2 (two) times daily for 15 days. 30 tablet 1  . verapamil (CALAN-SR) 120 MG CR tablet Take 1 tablet (120 mg total) by mouth at bedtime. For high blood pressure 90 tablet 1  . vitamin B-12 (CYANOCOBALAMIN) 1000 MCG tablet Take 1,000 mcg by mouth daily.     No current facility-administered medications for this visit.      Musculoskeletal: Strength & Muscle Tone: UTA Gait & Station: Reports as WNL Patient leans: N/A  Psychiatric Specialty Exam: Review of Systems  Neurological: Positive for tremors and headaches.  Psychiatric/Behavioral: Positive for hallucinations. The patient is nervous/anxious.   All other systems reviewed and are negative.   There were no vitals taken for this visit.There is no height or weight on file to calculate BMI.  General Appearance: UTA  Eye Contact:  UTA  Speech:  Normal Rate  Volume:  Normal  Mood:  Anxious  Affect:  UTA  Thought Process:  Goal Directed and Descriptions of Associations: Intact  Orientation:  Full (Time, Place, and Person)  Thought Content: Hallucinations: Auditory improving  Suicidal Thoughts:  No  Homicidal Thoughts:  No  Memory:  Immediate;   Fair Recent;   Fair Remote;   Fair  Judgement:  Fair  Insight:  Fair  Psychomotor Activity:  UTA  Concentration:  Concentration: Fair and Attention Span: Fair  Recall:  [employer] of Knowledge: Fair  Language: Fair  Akathisia:  No  Handed:  Right  AIMS (if indicated): does have tremors of BL hands- chronic- improving  Assets:  Communication Skills Desire for Improvement Social Support  ADL's:  Intact  Cognition: WNL  Sleep:  Fair   Screenings: AIMS     Admission (Discharged) from OP Visit from 08/30/2018 in [facility] Admission (Discharged) from 08/22/2018 in [facility] Office Visit from 12/01/2017 in [facility] Office Visit from 09/20/2017 in [facility] Office Visit from 06/29/2017 in [facility] Total Score  0  0  1  0  6    AUDIT     Admission (Discharged) from OP Visit from 08/30/2018 in [facility] Admission (Discharged) from 08/22/2018 in [facility] Admission (Discharged) from 03/03/2017 in [facility]  Alcohol Use Disorder Identification Test Final Score (AUDIT)  0  0  0    GAD-7     Office Visit from 08/22/2015 in [place] at [facility]  Total GAD-7 Score  6    PHQ2-9     Office Visit from 03/23/2016 in [place] Office Visit from 08/22/2015 in [place] at [place] from 08/06/2015 in [facility] Office Visit from 06/10/2015 in [facility] from 05/19/2015 in [place]  PHQ-2 Total Score  0  1  0  0  0       Assessment and Plan: Heather Haley is a 58 year old Caucasian female who has a history of schizoaffective disorder, PTSD, migraine headaches, diabetes melitis, drug-induced Parkinson's disease was evaluated by phone today.  Patient patient today reports she is currently making progress however continues to struggle with anxiety symptoms although Zoloft is beneficial.  We will continue to make medication readjustment.  Plan Schizoaffective disorder-improving Increase Zoloft to 150 mg p.o. daily with breakfast.  Zyprexa at reduced dosage of 2.5 mg p.o. nightly Amantadine 100 mg p.o. twice daily  PTSD-improving Zoloft as prescribed  GAD-improving Zoloft increased to 150 mg p.o. daily  Neuroleptic induced Parkinson's disease-improving Discussed  with patient to continue to work with neurology. Continue amantadine Zyprexa is at reduced dosage.   Follow-up in clinic in 3 to 4 weeks or sooner if needed.  December 30 at 1:40 PM  I have spent atleast 15 minutes non face to face with patient today. More than 50 % of the time was spent for psychoeducation and supportive psychotherapy and care coordination. This note was generated in part or whole with voice recognition software. Voice recognition is usually quite accurate but there are transcription errors that can and very often do occur. I apologize for any typographical errors that were not detected and corrected.       Ursula Alert, MD 01/03/2019, 2:37 PM

## 2019-01-11 DIAGNOSIS — Z23 Encounter for immunization: Secondary | ICD-10-CM | POA: Diagnosis not present

## 2019-01-11 DIAGNOSIS — R635 Abnormal weight gain: Secondary | ICD-10-CM | POA: Diagnosis not present

## 2019-01-11 DIAGNOSIS — Z114 Encounter for screening for human immunodeficiency virus [HIV]: Secondary | ICD-10-CM | POA: Diagnosis not present

## 2019-01-11 DIAGNOSIS — M545 Low back pain: Secondary | ICD-10-CM | POA: Diagnosis not present

## 2019-01-11 DIAGNOSIS — Z6841 Body Mass Index (BMI) 40.0 and over, adult: Secondary | ICD-10-CM | POA: Diagnosis not present

## 2019-01-11 DIAGNOSIS — E1165 Type 2 diabetes mellitus with hyperglycemia: Secondary | ICD-10-CM | POA: Diagnosis not present

## 2019-01-11 DIAGNOSIS — G20 Parkinson's disease: Secondary | ICD-10-CM | POA: Diagnosis not present

## 2019-01-11 DIAGNOSIS — R519 Headache, unspecified: Secondary | ICD-10-CM | POA: Diagnosis not present

## 2019-01-11 DIAGNOSIS — G8929 Other chronic pain: Secondary | ICD-10-CM | POA: Diagnosis not present

## 2019-01-11 DIAGNOSIS — M62838 Other muscle spasm: Secondary | ICD-10-CM | POA: Diagnosis not present

## 2019-01-11 DIAGNOSIS — I10 Essential (primary) hypertension: Secondary | ICD-10-CM | POA: Diagnosis not present

## 2019-01-11 DIAGNOSIS — E785 Hyperlipidemia, unspecified: Secondary | ICD-10-CM | POA: Diagnosis not present

## 2019-01-11 DIAGNOSIS — N1831 Chronic kidney disease, stage 3a: Secondary | ICD-10-CM | POA: Diagnosis not present

## 2019-01-12 DIAGNOSIS — E538 Deficiency of other specified B group vitamins: Secondary | ICD-10-CM | POA: Diagnosis not present

## 2019-01-16 DIAGNOSIS — M1731 Unilateral post-traumatic osteoarthritis, right knee: Secondary | ICD-10-CM | POA: Diagnosis not present

## 2019-01-18 ENCOUNTER — Ambulatory Visit (INDEPENDENT_AMBULATORY_CARE_PROVIDER_SITE_OTHER): Payer: Medicare HMO | Admitting: Internal Medicine

## 2019-01-18 ENCOUNTER — Encounter: Payer: Self-pay | Admitting: Internal Medicine

## 2019-01-18 DIAGNOSIS — J4520 Mild intermittent asthma, uncomplicated: Secondary | ICD-10-CM | POA: Diagnosis not present

## 2019-01-18 NOTE — Patient Instructions (Signed)
MEDICATION ADJUSTMENTS/LABS AND TESTS ORDERED: RECOMMEND WEIGHT LOSS ALBUTEROL AS NEEDED

## 2019-01-18 NOTE — Progress Notes (Signed)
Name: Heather Haley MRN: [id number] DOB: 10/03/1960     I connected with the patient by telephone enabled telemedicine visit and verified that I am speaking with the correct person using two identifiers.    I discussed the limitations, risks, security and privacy concerns of performing an evaluation and management service by telemedicine and the availability of in-person appointments. I also discussed with the patient that there may be a patient responsible charge related to this service. The patient expressed understanding and agreed to proceed.  PATIENT AGREES AND CONFIRMS -YES   Other persons participating in the visit and their role in the encounter: Patient, nursing  This visit type was conducted due to national recommendations for restrictions regarding the COVID-19 Pandemic (e.g. social distancing).  This format is felt to be most appropriate for this patient at this time.  All issues noted in this document were discussed and addressed.        Patient wears oxygen 1L [place] at night PFT's no obstruction   CONSULTATION DATE: 01/18/2019     SYNOPSIS chronic wheezing Dry intermittent cough Patient had pneumonia approximately 1 year ago states that she responded to antibiotics and prednisone well Former smoker 30-pack-year history quit 1996 CT chest and pulmonary function tests are within normal limits-no significant abnormality seen Working diagnosis is intermittent reactive airways disease with morbid obesity  CHIEF COMPLAINT:  FOLLOW UP SOB  HPI No exacerbation at this time No evidence of heart failure at this time No evidence or signs of infection at this time No respiratory distress No fevers, chills, nausea, vomiting, diarrhea No evidence of lower extremity edema No evidence hemoptysis  DOES NOT WEAR OXYGEN AT NIGHT TESTED OF OSA 5 YEARS AGO AND TEST WAS NEGATIVE    PAST MEDICAL HISTORY :   has a past medical history of Anxiety, Asthma, Chronic kidney  disease, Chronic pain, Depression, Diabetes mellitus ([place]), Frequency of urination, GERD (gastroesophageal reflux disease), Headache(784.0), High cholesterol, Hypertension, IBS (irritable bowel syndrome), Left ankle instability, Left knee DJD, Lumbar Degenerative Disc Disease of  (10/11/2014), Neuromuscular disorder ([place]), Osteoarthritis of hip (Right) (05/05/2015), Other enthesopathy of ankle and tarsus (12/15/2009), Parkinson's disease ([place]), Peripheral sensory neuropathy (Bilateral) (11/19/2014), Postoperative nausea and vomiting, and Schizophrenia ([place]).  has a past surgical history that includes Ankle surgery; Abdominal hysterectomy; Knee arthroscopy (1997); Total knee arthroplasty (08/30/2011); Colonoscopy (2013); Appendectomy; left heart catheterization with coronary angiogram (N/A, 01/11/2013); Esophagogastroduodenoscopy (egd) with propofol (N/A, 10/03/2014); Colonoscopy with propofol (N/A, 04/25/2017); Esophagogastroduodenoscopy (egd) with propofol (04/25/2017); and LEFT HEART CATH AND CORONARY ANGIOGRAPHY (Left, 11/10/2017). Prior to Admission medications   Medication Sig Start Date End Date Taking? Authorizing Provider  albuterol (PROVENTIL HFA;VENTOLIN HFA) 108 (90 Base) MCG/ACT inhaler Inhale 2 puffs into the lungs every 6 (six) hours as needed for wheezing or shortness of breath.   Yes [provider]  amantadine (SYMMETREL) 100 MG capsule Take 1 capsule (100 mg total) by mouth 2 (two) times daily. 09/18/18  Yes Ursula Alert, MD  aspirin 81 MG tablet Take 81 mg by mouth daily.    Yes [provider]  carbidopa-levodopa (SINEMET IR) 25-100 MG tablet Take 1 tablet by mouth 3 (three) times daily.   Yes [provider]  cetirizine (ZYRTEC) 10 MG tablet Take 10 mg by mouth daily.   Yes [provider]  cholecalciferol (VITAMIN D) 1000 units tablet Take 1,000 Units by mouth daily.   Yes [provider]  cyclobenzaprine (FLEXERIL) 10 MG tablet Take 10 mg by mouth  2 (two) times daily.    Yes [provider]  diphenhydrAMINE (BENADRYL ALLERGY) 25 mg capsule Take 1 capsule (25 mg total) by mouth daily as needed. For side effects of tremors due to zyprexa 01/30/18  Yes Eappen, Ria Clock, MD  docusate sodium (COLACE) 100 MG capsule Take 100 mg by mouth 2 (two) times daily.    Yes [provider]  glimepiride (AMARYL) 1 MG tablet Take 1 mg by mouth daily with breakfast.  03/16/18 03/16/19 Yes [provider]  hydroxypropyl methylcellulose / hypromellose (ISOPTO TEARS / GONIOVISC) 2.5 % ophthalmic solution Place 1 drop into both eyes every 4 (four) hours as needed for dry eyes.   Yes [provider]  losartan (COZAAR) 25 MG tablet Take 25 mg by mouth daily.   Yes [provider]  metFORMIN (GLUCOPHAGE) 500 MG tablet Take 1 tablet (500 mg total) by mouth 2 (two) times daily with a meal. For diabetes management 03/10/17  Yes Lindell Spar I, NP  mirtazapine (REMERON) 7.5 MG tablet Take 1 tablet (7.5 mg total) by mouth at bedtime as needed. sleep 09/07/18  Yes Eappen, Ria Clock, MD  OLANZapine (ZYPREXA) 5 MG tablet Take 1 tablet (5 mg total) by mouth at bedtime. 09/12/18  Yes Ursula Alert, MD  omeprazole (PRILOSEC) 40 MG capsule Take 40 mg by mouth daily as needed (heartburn).    Yes [provider]  ondansetron (ZOFRAN) 4 MG tablet Take 4 mg by mouth every 8 (eight) hours as needed for nausea or vomiting.   Yes [provider]  pravastatin (PRAVACHOL) 40 MG tablet Take 40 mg by mouth at bedtime.    Yes [provider]  rizatriptan (MAXALT) 10 MG tablet Take 10 mg by mouth 2 (two) times daily. May repeat in 2 hours if needed    Yes [provider]  senna (SENOKOT) 8.6 MG tablet Take 1-4 tablets (8.6-34.4 mg total) by mouth daily as needed for constipation. Patient taking differently: Take 1 tablet by mouth at bedtime.  03/10/17  Yes Lindell Spar I, NP  topiramate (TOPAMAX) 100 MG tablet Take 0.5  tablets (50 mg total) by mouth at bedtime. For mood control 05/06/17  Yes Eappen, Ria Clock, MD  traMADol (ULTRAM) 50 MG tablet Take 2 tablets (100 mg total) by mouth 2 (two) times daily for 15 days. 08/28/18 09/20/18 Yes Johnn Hai, MD  verapamil (CALAN-SR) 120 MG CR tablet Take 1 tablet (120 mg total) by mouth at bedtime. For high blood pressure 03/10/17  Yes Nwoko, Agnes I, NP  vitamin B-12 (CYANOCOBALAMIN) 1000 MCG tablet Take 1,000 mcg by mouth daily.   Yes [provider]   Allergies  Allergen Reactions  . Prolixin Decanoate [Fluphenazine]   . Benztropine Other (See Comments)    Hair fall out  Suicide thoughts  . Buprenorphine Hcl Other (See Comments)    Unable to void  . Carbidopa-Levodopa Nausea Only  . Codeine Hives    REACTION: hives  . Duloxetine Other (See Comments)    Altered mental status Alopecia, visual hallucinations, nightmares  . Gabapentin Swelling  . Lyrica [Pregabalin] Other (See Comments)    Alopecia, visual hallucinations, nightmares Altered mental status  . Morphine And Related Other (See Comments)    Unable to void  . Nortripytline Hcl [Nortriptyline] Other (See Comments)    Hair loss and night mares   . Nsaids     REACTION: palpitations, diaphoresis  . Penicillins Nausea And Vomiting and Other (See Comments)    REACTION: upset stomach  Has patient had a PCN reaction causing immediate rash, facial/tongue/throat swelling, SOB or lightheadedness with hypotension: No Has patient had a PCN reaction causing severe rash involving mucus membranes or skin necrosis: No Has patient had a PCN reaction that required hospitalization: No Has patient had a PCN reaction occurring within the last 10 years: No If all of the above answers are "NO", then may proceed with Cephalosporin use.  . Tolmetin Other (See Comments)    REACTION: palpitations, diaphoresis  . Wellbutrin [Bupropion]     Constipation, mood swings    FAMILY HISTORY:  family history includes  Alcohol abuse in her father; Cancer in her mother; Heart disease in her father; Hypertension in her father. SOCIAL HISTORY:  reports that she quit smoking about 23 years ago. Her smoking use included cigarettes. She has a 30.00 pack-year smoking history. She has never used smokeless tobacco. She reports that she does not drink alcohol or use drugs.    Review of Systems:  Gen:  Denies  fever, sweats, chills weight loss  HEENT: Denies blurred vision, double vision, ear pain, eye pain, hearing loss, nose bleeds, sore throat Cardiac:  No dizziness, chest pain or heaviness, chest tightness,edema, No JVD Resp:   No cough, -sputum production, -shortness of breath,-wheezing, -hemoptysis,  Gi: Denies swallowing difficulty, stomach pain, nausea or vomiting, diarrhea, constipation, bowel incontinence Gu:  Denies bladder incontinence, burning urine Ext:   Denies Joint pain, stiffness or swelling Skin: Denies  skin rash, easy bruising or bleeding or hives Endoc:  Denies polyuria, polydipsia , polyphagia or weight change Psych:   Denies depression, insomnia or hallucinations  Other:  All other systems negative      MEDICATIONS: I have reviewed all medications and confirmed regimen as documented     IMAGING   CT chest 2019/2020 No pneumonia No masses No acute findings    ASSESSMENT AND PLAN SYNOPSIS  58 year old white female with chronic progressive shortness of breath with wheezing and dyspnea on exertion with a history of extensive smoking history most likely related to underlying reactive airways disease  Pulmonary function testing did not reveal any obstructive lung disease CT CHEST September 2020 independently reviewed by me today IMPRESSION: 1. No acute findings.  Lungs are clear. 2. No findings to account for left-sided chest pain and shortness of Breath.  Shortness of breath and dyspnea exertion most likely related to her deconditioned state and morbid obesity with  intermittent reactive airways disease   Obesity -recommend significant weight loss -recommend changing diet  Deconditioned state -Recommend increased daily activity and exercise    COVID-19 EDUCATION: The signs and symptoms of COVID-19 were discussed with the patient and how to seek care for testing.  The importance of social distancing was discussed today. Hand Washing Techniques and avoid touching face was advised.  MEDICATION ADJUSTMENTS/LABS AND TESTS ORDERED: RECOMMEND WEIGHT LOSS ALBUTEROL AS NEEDED   CURRENT MEDICATIONS REVIEWED AT LENGTH WITH PATIENT TODAY   Patient satisfied with Plan of action and management. All questions answered  Follow up in 6 months  [place], M.D.  [facility]  Medical Director [place] Director [facility] Department

## 2019-01-25 DIAGNOSIS — J4540 Moderate persistent asthma, uncomplicated: Secondary | ICD-10-CM | POA: Diagnosis not present

## 2019-01-25 DIAGNOSIS — E7800 Pure hypercholesterolemia, unspecified: Secondary | ICD-10-CM | POA: Diagnosis not present

## 2019-01-25 DIAGNOSIS — R0789 Other chest pain: Secondary | ICD-10-CM | POA: Diagnosis not present

## 2019-01-25 DIAGNOSIS — I10 Essential (primary) hypertension: Secondary | ICD-10-CM | POA: Diagnosis not present

## 2019-01-25 DIAGNOSIS — R0600 Dyspnea, unspecified: Secondary | ICD-10-CM | POA: Diagnosis not present

## 2019-02-07 ENCOUNTER — Ambulatory Visit (INDEPENDENT_AMBULATORY_CARE_PROVIDER_SITE_OTHER): Payer: Medicare HMO | Admitting: Psychiatry

## 2019-02-07 ENCOUNTER — Other Ambulatory Visit: Payer: Self-pay

## 2019-02-07 ENCOUNTER — Encounter: Payer: Self-pay | Admitting: Psychiatry

## 2019-02-07 DIAGNOSIS — G2119 Other drug induced secondary parkinsonism: Secondary | ICD-10-CM | POA: Diagnosis not present

## 2019-02-07 DIAGNOSIS — F411 Generalized anxiety disorder: Secondary | ICD-10-CM | POA: Diagnosis not present

## 2019-02-07 DIAGNOSIS — F251 Schizoaffective disorder, depressive type: Secondary | ICD-10-CM | POA: Diagnosis not present

## 2019-02-07 DIAGNOSIS — F4310 Post-traumatic stress disorder, unspecified: Secondary | ICD-10-CM

## 2019-02-07 DIAGNOSIS — F5105 Insomnia due to other mental disorder: Secondary | ICD-10-CM

## 2019-02-07 MED ORDER — AMANTADINE HCL 100 MG PO CAPS: 300.0000 mg | ORAL_CAPSULE | ORAL | 1 refills | Status: DC

## 2019-02-07 MED ORDER — SERTRALINE HCL 100 MG PO TABS: 100.0000 mg | ORAL_TABLET | Freq: Every day | ORAL | 0 refills | Status: DC

## 2019-02-07 NOTE — Progress Notes (Signed)
Virtual Visit via Telephone Note  I connected with Heather Haley on 02/07/19 at  1:40 PM EST by telephone and verified that I am speaking with the correct person using two identifiers.   I discussed the limitations, risks, security and privacy concerns of performing an evaluation and management service by telephone and the availability of in person appointments. I also discussed with the patient that there may be a patient responsible charge related to this service. The patient expressed understanding and agreed to proceed.     I discussed the assessment and treatment plan with the patient. The patient was provided an opportunity to ask questions and all were answered. The patient agreed with the plan and demonstrated an understanding of the instructions.   The patient was advised to call back or seek an in-person evaluation if the symptoms worsen or if the condition fails to improve as anticipated.   [place] MD OP Progress Note  02/07/2019 6:18 PM Heather Haley  MRN:  [id number]  Chief Complaint:  Chief Complaint    Follow-up     HPI: Heather Haley is a 58 year old Caucasian female, lives in [place], has a history of schizoaffective disorder, PTSD, GAD, drug-induced Parkinson's disease, migraine headaches, memory problems, diabetes melitis, chronic pain was evaluated by phone today.  Patient preferred to do a phone call.  Patient today reports she had a good Christmas with her children.  She reports she however continues to be anxious.  She reports when she took the Zoloft she became extremely anxious and also her tremors of her arms increased.  She has currently takes Zoloft 100 mg orally and that seems to help.  Patient however continues to struggle with tremors of her arms.  Patient also continues to hear voices however reports they are chronic and it does not distress her much and she is able to cope with them.  Patient reports sleep continues to be good.  She denies any  suicidality, homicidality .  Patient denies any other concerns today. Visit Diagnosis:    ICD-10-CM   1. Schizoaffective disorder, depressive type ([place])  F25.1 amantadine (SYMMETREL) 100 MG capsule   stable  2. PTSD (post-traumatic stress disorder)  F43.10 sertraline (ZOLOFT) 100 MG tablet  3. Generalized anxiety disorder  F41.1 sertraline (ZOLOFT) 100 MG tablet  4. Insomnia due to mental disorder  F51.05   5. Drug-induced Parkinsonism ([place])  G21.19     Past Psychiatric History: I have reviewed past psychiatric history from my progress note on 05/25/2017.  Past Medical History:  Past Medical History:  Diagnosis Date  . Anxiety   . Asthma   . Chronic kidney disease   . Chronic pain    previously saw Dr. Consuela Mimes in pain clinic, then saw pain specialist in [place]  . Depression   . Diabetes mellitus ([place])   . Frequency of urination   . GERD (gastroesophageal reflux disease)   . Headache(784.0)   . High cholesterol   . Hypertension   . IBS (irritable bowel syndrome)   . Left ankle instability   . Left knee DJD   . Lumbar Degenerative Disc Disease of  10/11/2014  . Neuromuscular disorder ([place])   . Osteoarthritis of hip (Right) 05/05/2015  . Other enthesopathy of ankle and tarsus 12/15/2009   Qualifier: Diagnosis of  By: Oneida Alar MD, KARL    . Parkinson's disease ([place])   . Peripheral sensory neuropathy (Bilateral) 11/19/2014  . Postoperative nausea and vomiting   . Schizophrenia ([place])  Past Surgical History:  Procedure Laterality Date  . ABDOMINAL HYSTERECTOMY    . ANKLE SURGERY    . APPENDECTOMY    . COLONOSCOPY  2013  . COLONOSCOPY WITH PROPOFOL N/A 04/25/2017   Procedure: COLONOSCOPY WITH PROPOFOL;  Surgeon: Manya Silvas, MD;  Location: [facility] ENDOSCOPY;  Service: Endoscopy;  Laterality: N/A;  . ESOPHAGOGASTRODUODENOSCOPY (EGD) WITH PROPOFOL N/A 10/03/2014   Procedure: ESOPHAGOGASTRODUODENOSCOPY (EGD) WITH PROPOFOL;  Surgeon: Josefine Class, MD;  Location[facility]  ENDOSCOPY;  Service: Endoscopy;  Laterality: N/A;  . ESOPHAGOGASTRODUODENOSCOPY (EGD) WITH PROPOFOL  04/25/2017   Procedure: ESOPHAGOGASTRODUODENOSCOPY (EGD) WITH PROPOFOL;  Surgeon: Manya Silvas, MD;  Location: [facility] ENDOSCOPY;  Service: Endoscopy;;  . KNEE ARTHROSCOPY  1997   left knee  . LEFT HEART CATH AND CORONARY ANGIOGRAPHY Left 11/10/2017   Procedure: LEFT HEART CATH AND CORONARY ANGIOGRAPHY;  Surgeon: Isaias Cowman, MD;  Location: [place] CV LAB;  Service: Cardiovascular;  Laterality: Left;  . LEFT HEART CATHETERIZATION WITH CORONARY ANGIOGRAM N/A 01/11/2013   Procedure: LEFT HEART CATHETERIZATION WITH CORONARY ANGIOGRAM;  Surgeon: Sinclair Grooms, MD;  Location: [facility] CATH LAB;  Service: Cardiovascular;  Laterality: N/A;  . TOTAL KNEE ARTHROPLASTY  08/30/2011   Procedure: TOTAL KNEE ARTHROPLASTY;  Surgeon: Lorn Junes, MD;  Location: [facility];  Service: Orthopedics;  Laterality: Left;    Family Psychiatric History: I have reviewed family psychiatric history from my progress note on 05/25/2017.  Family History:  Family History  Problem Relation Age of Onset  . Cancer Mother   . Hypertension Father   . Heart disease Father   . Alcohol abuse Father   . Breast cancer Neg Hx     Social History: I have reviewed social history from my progress note on 05/25/2017. Social History   Socioeconomic History  . Marital status: Divorced    Spouse name: Not on file  . Number of children: 2  . Years of education: 51  . Highest education level: 11th grade  Occupational History    Employer: [employer]  Tobacco Use  . Smoking status: Former Smoker    Packs/day: 2.00    Years: 15.00    Pack years: 30.00    Types: Cigarettes    Quit date: 02/09/1995    Years since quitting: 24.0  . Smokeless tobacco: Never Used  Substance and Sexual Activity  . Alcohol use: No    Alcohol/week: 0.0 standard drinks  . Drug use: No  . Sexual activity: Not Currently  Other Topics  Concern  . Not on file  Social History Narrative   Patient lives at home alone. Patient works at [employer].   Caffeine daily- 2   Right handed.   Education- 11 th grade   Social Determinants of Health   Financial Resource Strain:   . Difficulty of Paying Living Expenses: Not on file  Food Insecurity:   . Worried About Charity fundraiser in the Last Year: Not on file  . Ran Out of Food in the Last Year: Not on file  Transportation Needs:   . Lack of Transportation (Medical): Not on file  . Lack of Transportation (Non-Medical): Not on file  Physical Activity:   . Days of Exercise per Week: Not on file  . Minutes of Exercise per Session: Not on file  Stress:   . Feeling of Stress : Not on file  Social Connections:   . Frequency of Communication with Friends and Family: Not on file  .  Frequency of Social Gatherings with Friends and Family: Not on file  . Attends Religious Services: Not on file  . Active Member of Clubs or Organizations: Not on file  . Attends Archivist Meetings: Not on file  . Marital Status: Not on file    Allergies:  Allergies  Allergen Reactions  . Prolixin Decanoate [Fluphenazine]   . Benztropine Other (See Comments)    Hair fall out  Suicide thoughts  . Buprenorphine Hcl Other (See Comments)    Unable to void  . Carbidopa-Levodopa Nausea Only  . Codeine Hives    REACTION: hives  . Duloxetine Other (See Comments)    Altered mental status Alopecia, visual hallucinations, nightmares  . Gabapentin Swelling  . Lyrica [Pregabalin] Other (See Comments)    Alopecia, visual hallucinations, nightmares Altered mental status  . Morphine And Related Other (See Comments)    Unable to void  . Nortripytline Hcl [Nortriptyline] Other (See Comments)    Hair loss and night mares   . Nsaids     REACTION: palpitations, diaphoresis  . Penicillins Nausea And Vomiting and Other (See Comments)    REACTION: upset stomach Has patient had a PCN  reaction causing immediate rash, facial/tongue/throat swelling, SOB or lightheadedness with hypotension: No Has patient had a PCN reaction causing severe rash involving mucus membranes or skin necrosis: No Has patient had a PCN reaction that required hospitalization: No Has patient had a PCN reaction occurring within the last 10 years: No If all of the above answers are "NO", then may proceed with Cephalosporin use.  . Tolmetin Other (See Comments)    REACTION: palpitations, diaphoresis  . Wellbutrin [Bupropion]     Constipation, mood swings    Metabolic Disorder Labs: Lab Results  Component Value Date   HGBA1C 6.2 (H) 03/06/2017   MPG 131.24 03/06/2017   MPG 108 05/17/2015   Lab Results  Component Value Date   PROLACTIN 64.2 (H) 03/06/2017   Lab Results  Component Value Date   CHOL 146 08/22/2018   TRIG 172 (H) 08/22/2018   HDL 49 08/22/2018   CHOLHDL 3.0 08/22/2018   VLDL 34 08/22/2018   LDLCALC 63 08/22/2018   LDLCALC 59 03/06/2017   Lab Results  Component Value Date   TSH 0.959 08/22/2018   TSH 2.766 08/24/2017    Therapeutic Level Labs: No results found for: LITHIUM No results found for: VALPROATE No components found for:  CBMZ  Current Medications: Current Outpatient Medications  Medication Sig Dispense Refill  . acetaminophen (TYLENOL) 500 MG tablet Take 500 mg by mouth every 6 (six) hours as needed.    Marland Kitchen albuterol (PROVENTIL HFA;VENTOLIN HFA) 108 (90 Base) MCG/ACT inhaler Inhale 2 puffs into the lungs every 6 (six) hours as needed for wheezing or shortness of breath.    . Alcohol Swabs (B-D SINGLE USE SWABS REGULAR) PADS     . amantadine (SYMMETREL) 100 MG capsule Take 3 capsules (300 mg total) by mouth as directed. Take 2 capsules ( 200 mg) in the AM and 1 capsule (100 mg) pm 90 capsule 1  . aspirin 81 MG tablet Take 81 mg by mouth daily.     . Blood Glucose Calibration (ACCU-CHEK AVIVA) SOLN     . Blood Glucose Monitoring Suppl (ACCU-CHEK AVIVA PLUS)  w/Device KIT     . carbidopa-levodopa (SINEMET IR) 25-100 MG tablet Take 1 tablet by mouth 3 (three) times daily.    . cetirizine (ZYRTEC) 10 MG tablet Take 10 mg by mouth  daily.    . cholecalciferol (VITAMIN D) 1000 units tablet Take 1,000 Units by mouth daily.    . cyclobenzaprine (FLEXERIL) 10 MG tablet Take 10 mg by mouth 2 (two) times daily.     . cyclobenzaprine (FLEXERIL) 5 MG tablet     . diphenhydrAMINE (BENADRYL ALLERGY) 25 mg capsule Take 1 capsule (25 mg total) by mouth daily as needed. For side effects of tremors due to zyprexa 30 capsule 2  . docusate sodium (COLACE) 100 MG capsule Take 100 mg by mouth 2 (two) times daily.     Marland Kitchen glimepiride (AMARYL) 1 MG tablet Take 1 mg by mouth daily with breakfast.     . HYDROcodone-acetaminophen (NORCO/VICODIN) 5-325 MG tablet Take 0.5 tablets by mouth every 12 (twelve) hours as needed for moderate pain.    . hydroxypropyl methylcellulose / hypromellose (ISOPTO TEARS / GONIOVISC) 2.5 % ophthalmic solution Place 1 drop into both eyes every 4 (four) hours as needed for dry eyes.    Marland Kitchen losartan (COZAAR) 25 MG tablet Take 25 mg by mouth daily.    . metFORMIN (GLUCOPHAGE) 500 MG tablet Take 1 tablet (500 mg total) by mouth 2 (two) times daily with a meal. For diabetes management 10 tablet 0  . montelukast (SINGULAIR) 10 MG tablet Take by mouth.    . OLANZapine (ZYPREXA) 5 MG tablet Take 0.5 tablets (2.5 mg total) by mouth at bedtime. 90 tablet 0  . omeprazole (PRILOSEC) 40 MG capsule Take 40 mg by mouth daily as needed (heartburn).     . ondansetron (ZOFRAN) 4 MG tablet Take 4 mg by mouth every 8 (eight) hours as needed for nausea or vomiting.    . pravastatin (PRAVACHOL) 40 MG tablet Take 40 mg by mouth at bedtime.     . rizatriptan (MAXALT) 10 MG tablet Take 10 mg by mouth 2 (two) times daily. May repeat in 2 hours if needed     . rOPINIRole (REQUIP XL) 4 MG 24 hr tablet     . rOPINIRole (REQUIP) 0.25 MG tablet Take 0.75 mg by mouth 3 (three) times  daily.    Marland Kitchen senna (SENOKOT) 8.6 MG tablet Take 1-4 tablets (8.6-34.4 mg total) by mouth daily as needed for constipation. (Patient taking differently: Take 1 tablet by mouth at bedtime. )    . sertraline (ZOLOFT) 100 MG tablet Take 1 tablet (100 mg total) by mouth daily. PATIENT HAS SUPPLIES FOR NOW 90 tablet 0  . topiramate (TOPAMAX) 100 MG tablet Take 0.5 tablets (50 mg total) by mouth at bedtime. For mood control 30 tablet 1  . traMADol (ULTRAM) 50 MG tablet Take 2 tablets (100 mg total) by mouth 2 (two) times daily for 15 days. 30 tablet 1  . verapamil (CALAN-SR) 120 MG CR tablet Take 1 tablet (120 mg total) by mouth at bedtime. For high blood pressure 90 tablet 1  . vitamin B-12 (CYANOCOBALAMIN) 1000 MCG tablet Take 1,000 mcg by mouth daily.     No current facility-administered medications for this visit.     Musculoskeletal: Strength & Muscle Tone: UTA Gait & Station: Reports as WNL Patient leans: N/A  Psychiatric Specialty Exam: Review of Systems  Neurological: Positive for tremors.  Psychiatric/Behavioral: Positive for hallucinations. The patient is nervous/anxious.   All other systems reviewed and are negative.   There were no vitals taken for this visit.There is no height or weight on file to calculate BMI.  General Appearance: UTA  Eye Contact:  UTA  Speech:  Normal  Rate  Volume:  Normal  Mood:  Anxious  Affect:  UTA  Thought Process:  Goal Directed and Descriptions of Associations: Intact  Orientation:  Full (Time, Place, and Person)  Thought Content: Hallucinations: Auditory chronic - does not elaborate  Suicidal Thoughts:  No  Homicidal Thoughts:  No  Memory:  Immediate;   Fair Recent;   Fair Remote;   Fair  Judgement:  Fair  Insight:  Fair  Psychomotor Activity:  UTA  Concentration:  Concentration: Fair and Attention Span: Fair  Recall:  [employer] of Knowledge: Fair  Language: Fair  Akathisia:  No  Handed:  Right  AIMS (if indicated):does have tremors of  BL hands - chronic   Assets:  Communication Skills Desire for Improvement Housing Social Support  ADL's:  Intact  Cognition: WNL  Sleep:  Fair   Screenings: AIMS     Admission (Discharged) from OP Visit from 08/30/2018 in [facility] Admission (Discharged) from 08/22/2018 in [facility] Office Visit from 12/01/2017 in [place] Office Visit from 09/20/2017 in [place] Office Visit from 06/29/2017 in [place] Total Score  0  0  1  0  6    AUDIT     Admission (Discharged) from OP Visit from 08/30/2018 in [place] Admission (Discharged) from 08/22/2018 in [place] [facility] Admission (Discharged) from 03/03/2017 in [facility] [facility]  Alcohol Use Disorder Identification Test Final Score (AUDIT)  0  0  0    GAD-7     Office Visit from 08/22/2015 in [place] at [facility]  Total GAD-7 Score  6    PHQ2-9     Office Visit from 03/23/2016 in [place] Office Visit from 08/22/2015 in [place] at [facility] from 08/06/2015 in [place] Office Visit from 06/10/2015 in [facility] Clinical Support from 05/19/2015 in [place]  PHQ-2 Total Score  0  1  0  0  0       Assessment and Plan: Heather Haley is a 58 year old Caucasian female who has a history of schizoaffective disorder, PTSD, migraine headaches, diabetes melitis, drug-induced Parkinson's disease was evaluated by phone today.  Patient currently struggles with anxiety as well as tremors of her arms.  Patient will benefit from medication readjustment.  Plan Schizoaffective disorder-stable Continue Zoloft however at reduced dose to  100 mg p.o. daily. Zyprexa 2.5 mg p.o. nightly-reduce dosage. Increase amantadine to 300 mg p.o. daily in divided dosage.  PTSD-stable Zoloft as prescribed  GAD-some progress Zoloft at 100 mg p.o. daily Patient was referred for CBT-however has been noncompliant.  Neuroleptic induced Parkinson's disease-unstable Increase amantadine as prescribed Will continue neurology follow-ups. Zyprexa at reduced dosage.  Follow-up in clinic in 4 weeks or sooner if needed.  February 2 at 3 PM  I have spent atleast 15 minutes non face to face with patient today. More than 50 % of the time was spent for psychoeducation and supportive psychotherapy and care coordination. This note was generated in part or whole with voice recognition software. Voice recognition is usually quite accurate but there are transcription errors that can and very often do occur. I apologize for any typographical errors that were not detected and corrected.       Ursula Alert, MD  02/07/2019, 6:18 PM

## 2019-02-14 IMAGING — CT CT HEAD W/O CM
4 series · 16 of 47 positions shown, 18 images · non-contrast
Comparison: Head CT October 09, 2014 and brain MRI April 27, 2015

CLINICAL DATA: Headache and altered mental status.

EXAM:
CT HEAD WITHOUT CONTRAST
TECHNIQUE: Contiguous axial images were obtained from the base of the skull
through the vertex without intravenous contrast.

[Series 3: head without · axial · non-contrast · 0.48mm/px · z∈[-57,+63]mm · 7 of 34 slices shown, 9 images]
[im 5/34  brain]
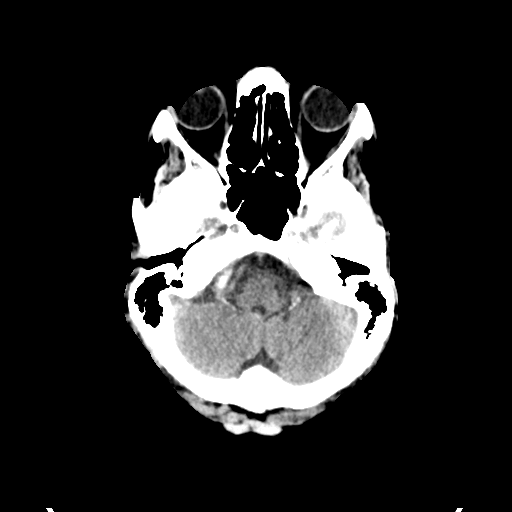
[im 5/34  bone]
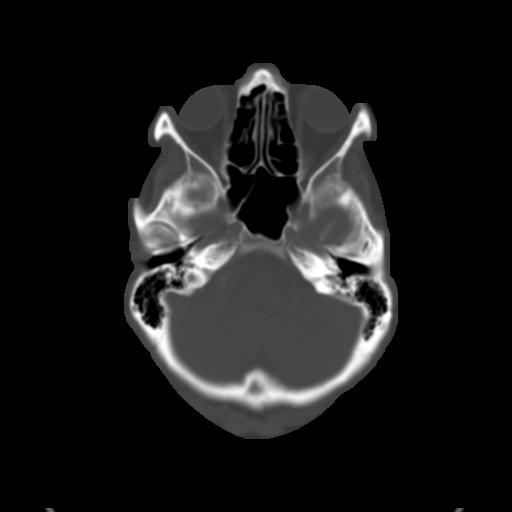
[im 9/34  brain]
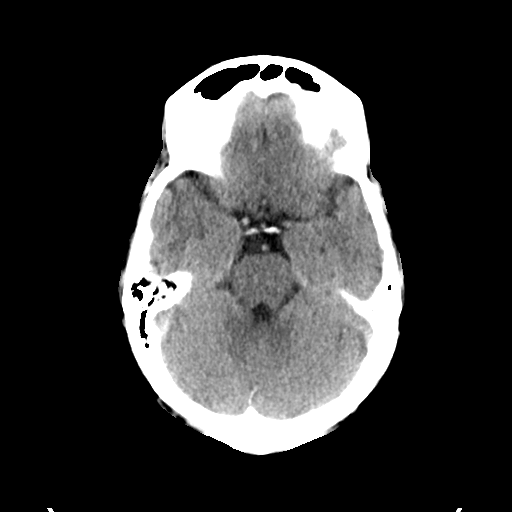
[im 13/34  brain]
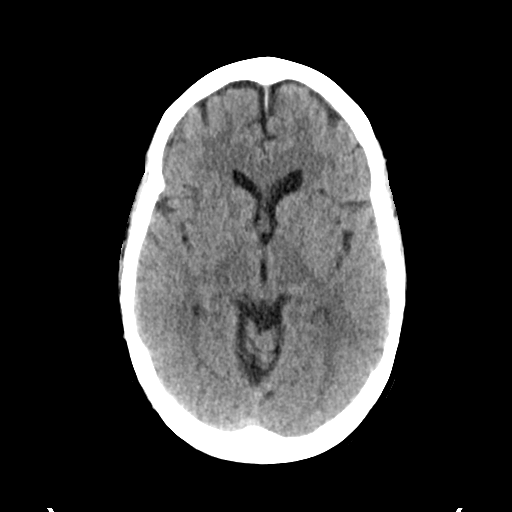
[im 17/34  brain]
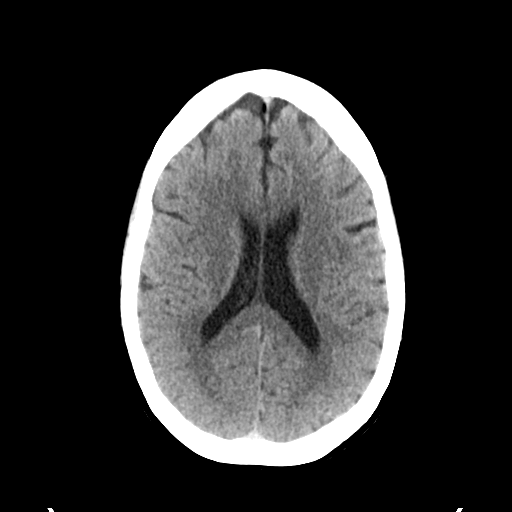
[im 21/34  brain]
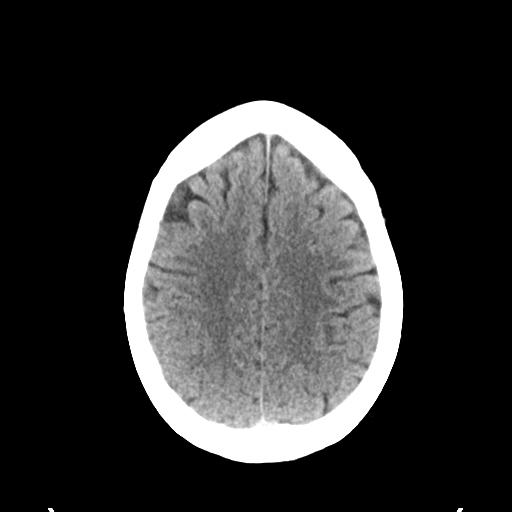
[im 21/34  bone]
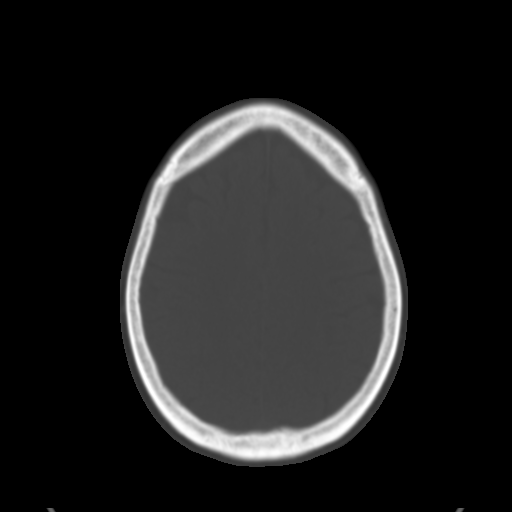
[im 25/34  brain]
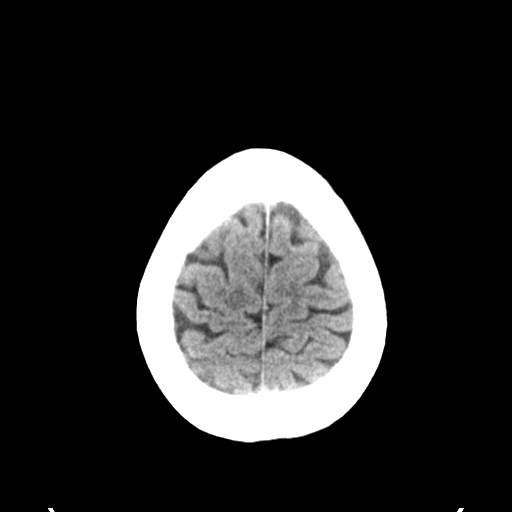
[im 29/34  brain]
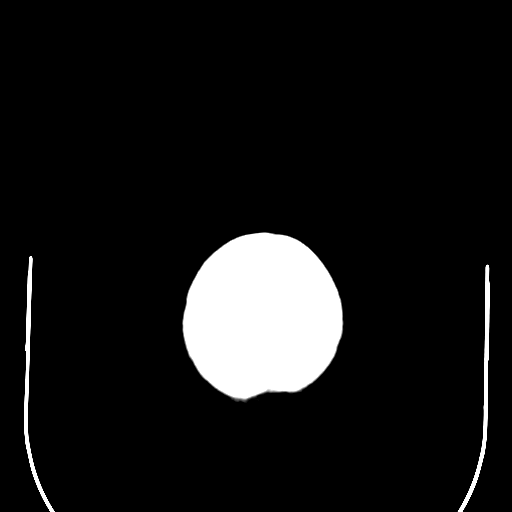

[Series 4: head bone · axial · 0.48mm/px · z∈[-61,-29]mm · 3 of 83 slices shown]
[im 9/83  bone]
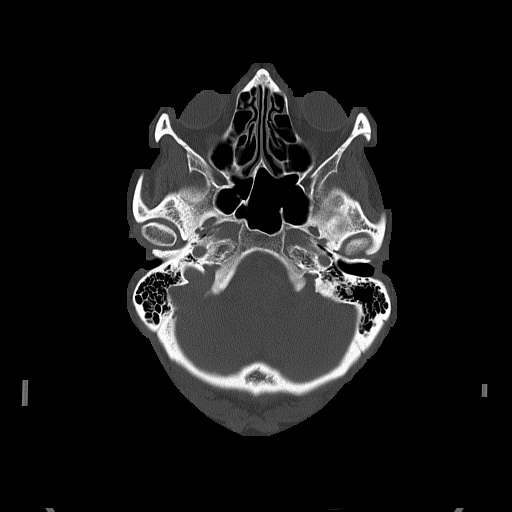
[im 17/83  bone]
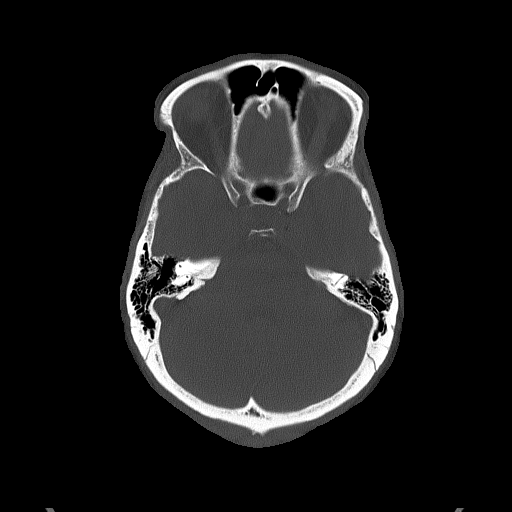
[im 25/83  bone]
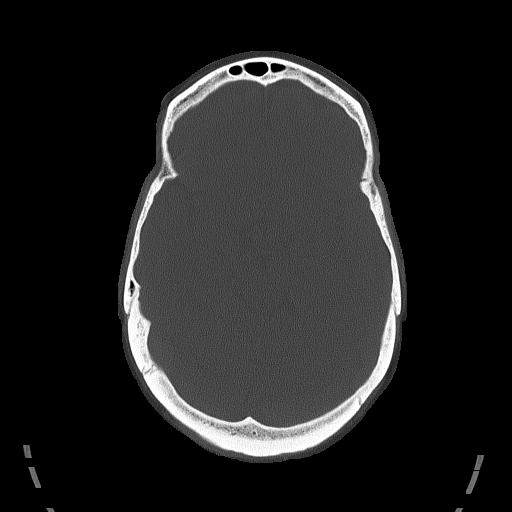

[Series 5: head without cor · coronal · non-contrast · 0.34mm/px · 3 of 66 slices shown]
[im 22/66  brain]
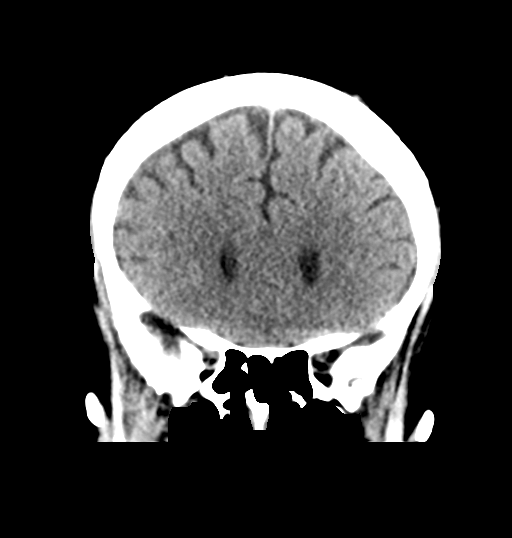
[im 29/66  brain]
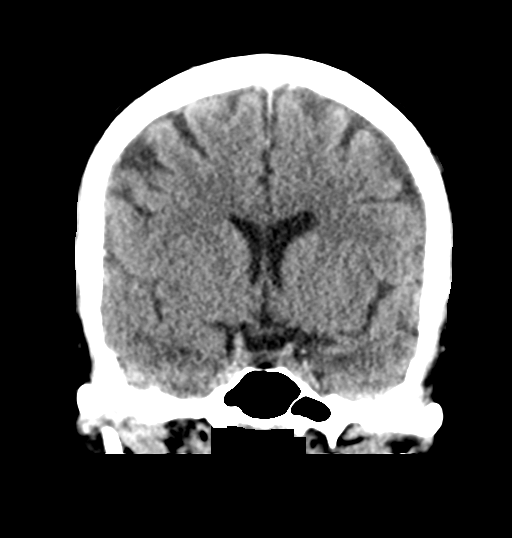
[im 37/66  brain]
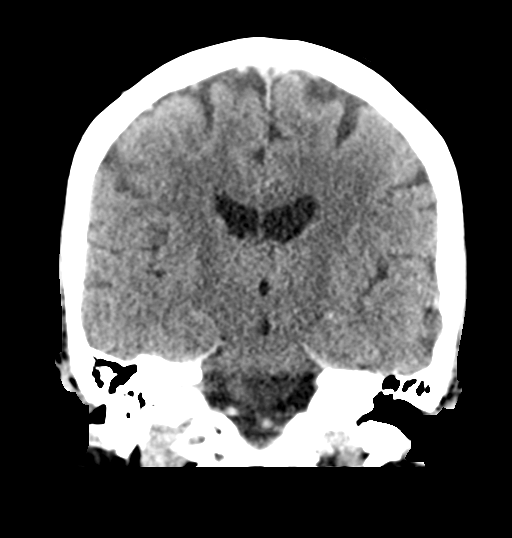

[Series 6: head without sag · sagittal · non-contrast · 0.38mm/px · 3 of 62 slices shown]
[im 21/62  brain]
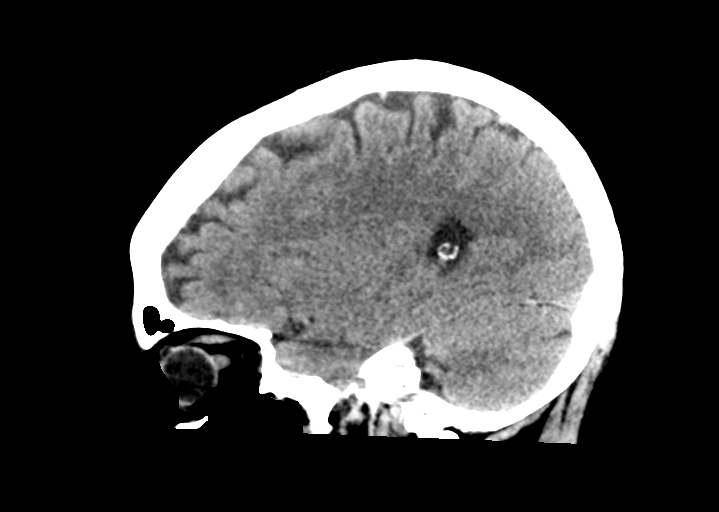
[im 31/62  brain]
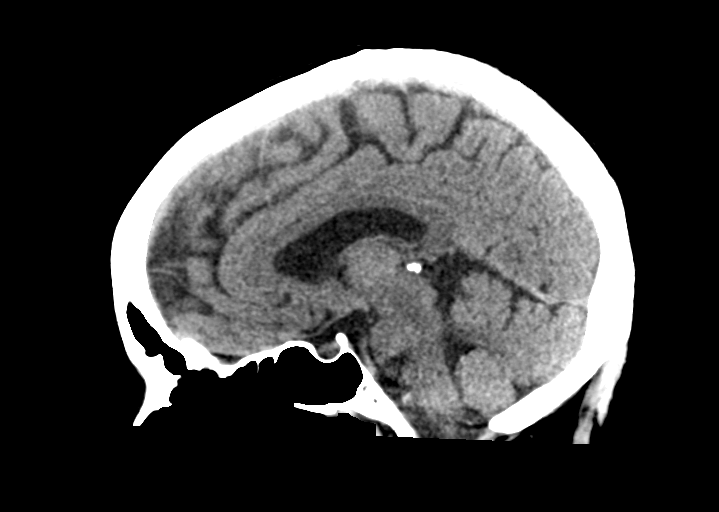
[im 41/62  brain]
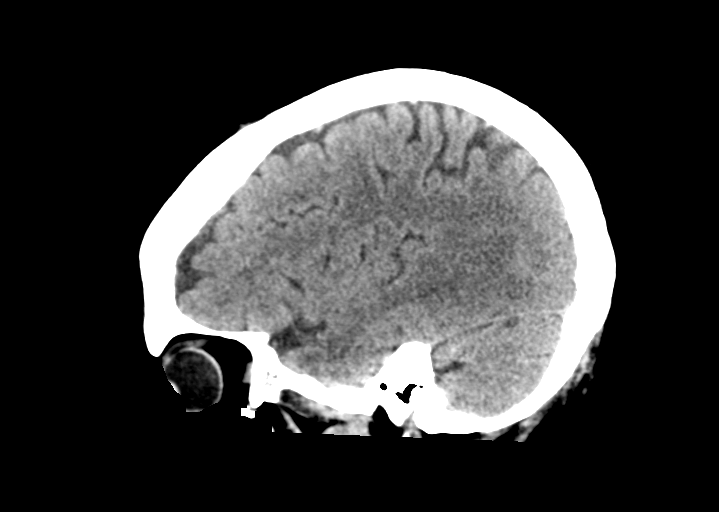

[16 of 47 positions shown; findings below may reference images not displayed]

FINDINGS: Brain: The ventricles are normal in size and configuration. There is
no intracranial mass, hemorrhage, extra-axial fluid collection, or
midline shift. Gray-white compartments are normal. No evident acute
infarct.

Vascular: No hyperdense vessel. No vascular calcifications are
evident.

Skull: The bony calvarium appears intact.

Sinuses/Orbits: There is slight mucosal thickening in several
ethmoid air cells bilaterally. Other visualized paranasal sinuses
are clear. Orbits appear symmetric bilaterally.

Other: Mastoid air cells are clear.
IMPRESSION: Slight mucosal thickening in several ethmoid air cells bilaterally.
No intracranial mass, hemorrhage, or extra-axial fluid collection.
Gray-white compartments appear normal.

## 2019-02-20 DIAGNOSIS — H40023 Open angle with borderline findings, high risk, bilateral: Secondary | ICD-10-CM | POA: Diagnosis not present

## 2019-03-06 ENCOUNTER — Other Ambulatory Visit: Payer: Self-pay | Admitting: Psychiatry

## 2019-03-06 DIAGNOSIS — F4310 Post-traumatic stress disorder, unspecified: Secondary | ICD-10-CM

## 2019-03-06 DIAGNOSIS — F411 Generalized anxiety disorder: Secondary | ICD-10-CM

## 2019-03-07 DIAGNOSIS — E538 Deficiency of other specified B group vitamins: Secondary | ICD-10-CM | POA: Diagnosis not present

## 2019-03-13 ENCOUNTER — Other Ambulatory Visit: Payer: Self-pay

## 2019-03-13 ENCOUNTER — Encounter: Payer: Self-pay | Admitting: Psychiatry

## 2019-03-13 ENCOUNTER — Ambulatory Visit (INDEPENDENT_AMBULATORY_CARE_PROVIDER_SITE_OTHER): Payer: Medicare HMO | Admitting: Psychiatry

## 2019-03-13 DIAGNOSIS — F251 Schizoaffective disorder, depressive type: Secondary | ICD-10-CM

## 2019-03-13 DIAGNOSIS — F411 Generalized anxiety disorder: Secondary | ICD-10-CM

## 2019-03-13 DIAGNOSIS — F4310 Post-traumatic stress disorder, unspecified: Secondary | ICD-10-CM

## 2019-03-13 DIAGNOSIS — G2119 Other drug induced secondary parkinsonism: Secondary | ICD-10-CM

## 2019-03-13 DIAGNOSIS — F5105 Insomnia due to other mental disorder: Secondary | ICD-10-CM | POA: Diagnosis not present

## 2019-03-13 NOTE — Progress Notes (Signed)
Provider Location : [facility] Patient Location : Home   Virtual Visit via Telephone Note  I connected with Heather Haley on 03/13/19 at  3:00 PM EST by telephone and verified that I am speaking with the correct person using two identifiers.   I discussed the limitations, risks, security and privacy concerns of performing an evaluation and management service by telephone and the availability of in person appointments. I also discussed with the patient that there may be a patient responsible charge related to this service. The patient expressed understanding and agreed to proceed.      I discussed the assessment and treatment plan with the patient. The patient was provided an opportunity to ask questions and all were answered. The patient agreed with the plan and demonstrated an understanding of the instructions.   The patient was advised to call back or seek an in-person evaluation if the symptoms worsen or if the condition fails to improve as anticipated.  [place] MD OP Progress Note  03/13/2019 3:08 PM [name]  MRN:  [id number]  Chief Complaint:  Chief Complaint    Follow-up     HPI: Heather Haley is a 59 year old Caucasian female, lives in [place], has a history of schizoaffective disorder, PTSD, GAD, drug-induced Parkinson's disease, migraine headaches, memory problems, diabetes melitis, chronic pain was evaluated by phone today.  Patient preferred to do a phone call.  Patient today reports she is currently doing well on the current medication regimen.  She reports ever since the amantadine dosage was increased her tremors have improved a lot.  She currently does not have any tremors on the right side, her left upper extremities she does have tremors however they have improved.  Patient denies any perceptual disturbances at this time.  She reports sleep continues to be good.  Patient reports she has support system from her daughter.  She denies any other concerns  today. Visit Diagnosis:    ICD-10-CM   1. Schizoaffective disorder, depressive type ([place])  F25.1    STABLE  2. PTSD (post-traumatic stress disorder)  F43.10   3. Generalized anxiety disorder  F41.1   4. Insomnia due to mental disorder  F51.05   5. Drug-induced Parkinsonism ([place])  G21.19     Past Psychiatric History: I have reviewed past psychiatric history from my progress note on 05/25/2017.  Past Medical History:  Past Medical History:  Diagnosis Date  . Anxiety   . Asthma   . Chronic kidney disease   . Chronic pain    previously saw Dr. Consuela Mimes in pain clinic, then saw pain specialist in [place]  . Depression   . Diabetes mellitus ([place])   . Frequency of urination   . GERD (gastroesophageal reflux disease)   . Headache(784.0)   . High cholesterol   . Hypertension   . IBS (irritable bowel syndrome)   . Left ankle instability   . Left knee DJD   . Lumbar Degenerative Disc Disease of  10/11/2014  . Neuromuscular disorder ([place])   . Osteoarthritis of hip (Right) 05/05/2015  . Other enthesopathy of ankle and tarsus 12/15/2009   Qualifier: Diagnosis of  By: Oneida Alar MD, KARL    . Parkinson's disease ([place])   . Peripheral sensory neuropathy (Bilateral) 11/19/2014  . Postoperative nausea and vomiting   . Schizophrenia [facility])     Past Surgical History:  Procedure Laterality Date  . ABDOMINAL HYSTERECTOMY    . ANKLE SURGERY    . APPENDECTOMY    . COLONOSCOPY  2013  .  COLONOSCOPY WITH PROPOFOL N/A 04/25/2017   Procedure: COLONOSCOPY WITH PROPOFOL;  Surgeon: Manya Silvas, MD;  Location: [facility] ENDOSCOPY;  Service: Endoscopy;  Laterality: N/A;  . ESOPHAGOGASTRODUODENOSCOPY (EGD) WITH PROPOFOL N/A 10/03/2014   Procedure: ESOPHAGOGASTRODUODENOSCOPY (EGD) WITH PROPOFOL;  Surgeon: Josefine Class, MD;  Location: [facility] ENDOSCOPY;  Service: Endoscopy;  Laterality: N/A;  . ESOPHAGOGASTRODUODENOSCOPY (EGD) WITH PROPOFOL  04/25/2017   Procedure: ESOPHAGOGASTRODUODENOSCOPY (EGD) WITH  PROPOFOL;  Surgeon: Manya Silvas, MD;  Location: [facility] ENDOSCOPY;  Service: Endoscopy;;  . KNEE ARTHROSCOPY  1997   left knee  . LEFT HEART CATH AND CORONARY ANGIOGRAPHY Left 11/10/2017   Procedure: LEFT HEART CATH AND CORONARY ANGIOGRAPHY;  Surgeon: Isaias Cowman, MD;  Location: [facility] CV LAB;  Service: Cardiovascular;  Laterality: Left;  . LEFT HEART CATHETERIZATION WITH CORONARY ANGIOGRAM N/A 01/11/2013   Procedure: LEFT HEART CATHETERIZATION WITH CORONARY ANGIOGRAM;  Surgeon: Sinclair Grooms, MD;  Location: [facility] CATH LAB;  Service: Cardiovascular;  Laterality: N/A;  . TOTAL KNEE ARTHROPLASTY  08/30/2011   Procedure: TOTAL KNEE ARTHROPLASTY;  Surgeon: Lorn Junes, MD;  Location: [facility];  Service: Orthopedics;  Laterality: Left;    Family Psychiatric History: I have reviewed family psychiatric history from my progress note on 05/25/2017.  Family History:  Family History  Problem Relation Age of Onset  . Cancer Mother   . Hypertension Father   . Heart disease Father   . Alcohol abuse Father   . Breast cancer Neg Hx     Social History: I have reviewed social history from my progress note on 05/25/2017. Social History   Socioeconomic History  . Marital status: Divorced    Spouse name: Not on file  . Number of children: 2  . Years of education: 56  . Highest education level: 11th grade  Occupational History    Employer: [employer]  Tobacco Use  . Smoking status: Former Smoker    Packs/day: 2.00    Years: 15.00    Pack years: 30.00    Types: Cigarettes    Quit date: 02/09/1995    Years since quitting: 24.1  . Smokeless tobacco: Never Used  Substance and Sexual Activity  . Alcohol use: No    Alcohol/week: 0.0 standard drinks  . Drug use: No  . Sexual activity: Not Currently  Other Topics Concern  . Not on file  Social History Narrative   Patient lives at home alone. Patient works at [employer].   Caffeine daily- 2   Right handed.   Education- 11  th grade   Social Determinants of Health   Financial Resource Strain:   . Difficulty of Paying Living Expenses: Not on file  Food Insecurity:   . Worried About Charity fundraiser in the Last Year: Not on file  . Ran Out of Food in the Last Year: Not on file  Transportation Needs:   . Lack of Transportation (Medical): Not on file  . Lack of Transportation (Non-Medical): Not on file  Physical Activity:   . Days of Exercise per Week: Not on file  . Minutes of Exercise per Session: Not on file  Stress:   . Feeling of Stress : Not on file  Social Connections:   . Frequency of Communication with Friends and Family: Not on file  . Frequency of Social Gatherings with Friends and Family: Not on file  . Attends Religious Services: Not on file  . Active Member of Clubs or Organizations: Not on file  .  Attends Archivist Meetings: Not on file  . Marital Status: Not on file    Allergies:  Allergies  Allergen Reactions  . Prolixin Decanoate [Fluphenazine]   . Benztropine Other (See Comments)    Hair fall out  Suicide thoughts  . Buprenorphine Hcl Other (See Comments)    Unable to void  . Carbidopa-Levodopa Nausea Only  . Codeine Hives    REACTION: hives  . Duloxetine Other (See Comments)    Altered mental status Alopecia, visual hallucinations, nightmares  . Gabapentin Swelling  . Lyrica [Pregabalin] Other (See Comments)    Alopecia, visual hallucinations, nightmares Altered mental status  . Morphine And Related Other (See Comments)    Unable to void  . Nortripytline Hcl [Nortriptyline] Other (See Comments)    Hair loss and night mares   . Nsaids     REACTION: palpitations, diaphoresis  . Penicillins Nausea And Vomiting and Other (See Comments)    REACTION: upset stomach Has patient had a PCN reaction causing immediate rash, facial/tongue/throat swelling, SOB or lightheadedness with hypotension: No Has patient had a PCN reaction causing severe rash involving mucus  membranes or skin necrosis: No Has patient had a PCN reaction that required hospitalization: No Has patient had a PCN reaction occurring within the last 10 years: No If all of the above answers are "NO", then may proceed with Cephalosporin use.  . Tolmetin Other (See Comments)    REACTION: palpitations, diaphoresis  . Wellbutrin [Bupropion]     Constipation, mood swings    Metabolic Disorder Labs: Lab Results  Component Value Date   HGBA1C 6.2 (H) 03/06/2017   MPG 131.24 03/06/2017   MPG 108 05/17/2015   Lab Results  Component Value Date   PROLACTIN 64.2 (H) 03/06/2017   Lab Results  Component Value Date   CHOL 146 08/22/2018   TRIG 172 (H) 08/22/2018   HDL 49 08/22/2018   CHOLHDL 3.0 08/22/2018   VLDL 34 08/22/2018   LDLCALC 63 08/22/2018   LDLCALC 59 03/06/2017   Lab Results  Component Value Date   TSH 0.959 08/22/2018   TSH 2.766 08/24/2017    Therapeutic Level Labs: No results found for: LITHIUM No results found for: VALPROATE No components found for:  CBMZ  Current Medications: Current Outpatient Medications  Medication Sig Dispense Refill  . acetaminophen (TYLENOL) 500 MG tablet Take 500 mg by mouth every 6 (six) hours as needed.    Marland Kitchen albuterol (PROVENTIL HFA;VENTOLIN HFA) 108 (90 Base) MCG/ACT inhaler Inhale 2 puffs into the lungs every 6 (six) hours as needed for wheezing or shortness of breath.    . Alcohol Swabs (B-D SINGLE USE SWABS REGULAR) PADS     . amantadine (SYMMETREL) 100 MG capsule Take 3 capsules (300 mg total) by mouth as directed. Take 2 capsules ( 200 mg) in the AM and 1 capsule (100 mg) pm 90 capsule 1  . aspirin 81 MG tablet Take 81 mg by mouth daily.     . Blood Glucose Calibration (ACCU-CHEK AVIVA) SOLN     . Blood Glucose Monitoring Suppl (ACCU-CHEK AVIVA PLUS) w/Device KIT     . carbidopa-levodopa (SINEMET IR) 25-100 MG tablet Take 1 tablet by mouth 3 (three) times daily.    . cetirizine (ZYRTEC) 10 MG tablet Take 10 mg by mouth daily.     . cholecalciferol (VITAMIN D) 1000 units tablet Take 1,000 Units by mouth daily.    . cyclobenzaprine (FLEXERIL) 10 MG tablet Take 10 mg by mouth 2 (  two) times daily.     . cyclobenzaprine (FLEXERIL) 5 MG tablet     . diphenhydrAMINE (BENADRYL ALLERGY) 25 mg capsule Take 1 capsule (25 mg total) by mouth daily as needed. For side effects of tremors due to zyprexa 30 capsule 2  . docusate sodium (COLACE) 100 MG capsule Take 100 mg by mouth 2 (two) times daily.     Marland Kitchen glimepiride (AMARYL) 1 MG tablet Take 1 mg by mouth daily with breakfast.     . HYDROcodone-acetaminophen (NORCO/VICODIN) 5-325 MG tablet Take 0.5 tablets by mouth every 12 (twelve) hours as needed for moderate pain.    . hydroxypropyl methylcellulose / hypromellose (ISOPTO TEARS / GONIOVISC) 2.5 % ophthalmic solution Place 1 drop into both eyes every 4 (four) hours as needed for dry eyes.    Marland Kitchen losartan (COZAAR) 25 MG tablet Take 25 mg by mouth daily.    . metFORMIN (GLUCOPHAGE) 500 MG tablet Take 1 tablet (500 mg total) by mouth 2 (two) times daily with a meal. For diabetes management 10 tablet 0  . montelukast (SINGULAIR) 10 MG tablet Take by mouth.    . OLANZapine (ZYPREXA) 5 MG tablet Take 0.5 tablets (2.5 mg total) by mouth at bedtime. 90 tablet 0  . omeprazole (PRILOSEC) 40 MG capsule Take 40 mg by mouth daily as needed (heartburn).     . ondansetron (ZOFRAN) 4 MG tablet Take 4 mg by mouth every 8 (eight) hours as needed for nausea or vomiting.    . pravastatin (PRAVACHOL) 40 MG tablet Take 40 mg by mouth at bedtime.     . rizatriptan (MAXALT) 10 MG tablet Take 10 mg by mouth 2 (two) times daily. May repeat in 2 hours if needed     . rOPINIRole (REQUIP XL) 4 MG 24 hr tablet     . rOPINIRole (REQUIP) 0.25 MG tablet Take 0.75 mg by mouth 3 (three) times daily.    Marland Kitchen senna (SENOKOT) 8.6 MG tablet Take 1-4 tablets (8.6-34.4 mg total) by mouth daily as needed for constipation. (Patient taking differently: Take 1 tablet by mouth at  bedtime. )    . sertraline (ZOLOFT) 100 MG tablet TAKE 1 TABLET EVERY DAY 90 tablet 0  . topiramate (TOPAMAX) 100 MG tablet Take 0.5 tablets (50 mg total) by mouth at bedtime. For mood control 30 tablet 1  . traMADol (ULTRAM) 50 MG tablet Take 2 tablets (100 mg total) by mouth 2 (two) times daily for 15 days. 30 tablet 1  . verapamil (CALAN-SR) 120 MG CR tablet Take 1 tablet (120 mg total) by mouth at bedtime. For high blood pressure 90 tablet 1  . vitamin B-12 (CYANOCOBALAMIN) 1000 MCG tablet Take 1,000 mcg by mouth daily.     No current facility-administered medications for this visit.     Musculoskeletal: Strength & Muscle Tone: UTA Gait & Station: Reports as WNL Patient leans: N/A  Psychiatric Specialty Exam: Review of Systems  Neurological: Positive for tremors.  All other systems reviewed and are negative.   There were no vitals taken for this visit.There is no height or weight on file to calculate BMI.  General Appearance: UTA  Eye Contact:  UTA  Speech:  Clear and Coherent  Volume:  Normal  Mood:  Euthymic  Affect:  UTA  Thought Process:  Goal Directed and Descriptions of Associations: Intact  Orientation:  Full (Time, Place, and Person)  Thought Content: Logical   Suicidal Thoughts:  No  Homicidal Thoughts:  No  Memory:  Immediate;   Fair Recent;   Fair Remote;   Fair  Judgement:  Fair  Insight:  Fair  Psychomotor Activity:  Tremor  Concentration:  Concentration: Fair and Attention Span: Fair  Recall:  [employer] of Knowledge: Fair  Language: Fair  Akathisia:  No  Handed:  Right  AIMS (if indicated): tremors improved , reports the tremors of right side has stopped and left side as improving - hands.  Assets:  Communication Skills Desire for Improvement Housing Social Support  ADL's:  Intact  Cognition: WNL  Sleep:  Fair   Screenings: AIMS     Admission (Discharged) from OP Visit from 08/30/2018 in [facility] Admission  (Discharged) from 08/22/2018 in [facility] Office Visit from 12/01/2017 in [place] Office Visit from 09/20/2017 in [facility] Office Visit from 06/29/2017 in [place] Total Score  0  0  1  0  6    AUDIT     Admission (Discharged) from OP Visit from 08/30/2018 in [facility] Admission (Discharged) from 08/22/2018 in [facility] Admission (Discharged) from 03/03/2017 in [facility]  Alcohol Use Disorder Identification Test Final Score (AUDIT)  0  0  0    GAD-7     Office Visit from 08/22/2015 in [place] at [facility]  Total GAD-7 Score  6    PHQ2-9     Office Visit from 03/23/2016 in [place] Office Visit from 08/22/2015 in [facility] [facility] [place] from 08/06/2015 in [place] Office Visit from 06/10/2015 in [place] [facility] Clinical Support from 05/19/2015 in [facility]  PHQ-2 Total Score  0  1  0  0  0       Assessment and Plan: Denetra is a 59 year old Caucasian female who has a history of schizoaffective disorder, PTSD, migraine headaches, drug-induced Parkinson's disease, was evaluated by phone today.  Patient is currently making progress on the current medication regimen.  Plan as noted below.  Plan Schizoaffective disorder-stable Zoloft at reduced dosage of 100 mg p.o. daily Zyprexa 2.5 mg p.o. nightly-reduce dosage Amantadine 300 mg p.o. daily in divided dosage.  PTSD-stable Zoloft as prescribed  GAD-improving Zoloft as prescribed. Patient was referred for CBT-noncompliant  Neuroleptic induced Parkinson's disease-improving Amantadine increased dosage has helped her with her  tremors. We will continue to monitor closely. Advised patient to continue to work with neurology. Zyprexa dosage has been reduced.  Follow-up in clinic in 6 to 8 weeks or sooner if needed.  April 20 at 2 PM  I have spent atleast 20 minutes non face to face with patient today. More than 50 % of the time was spent for  ordering medications and test ,psychoeducation and supportive psychotherapy and care coordination,as well as documenting clinical information in electronic health records. This note was generated in part or whole with voice recognition software. Voice recognition is usually quite accurate but there are transcription errors that can and very often do occur. I apologize for any typographical errors that were not detected and corrected.       Ursula Alert, MD 03/13/2019, 3:08 PM

## 2019-03-20 DIAGNOSIS — R600 Localized edema: Secondary | ICD-10-CM | POA: Diagnosis not present

## 2019-03-20 DIAGNOSIS — L0390 Cellulitis, unspecified: Secondary | ICD-10-CM | POA: Diagnosis not present

## 2019-03-22 DIAGNOSIS — G2119 Other drug induced secondary parkinsonism: Secondary | ICD-10-CM | POA: Diagnosis not present

## 2019-03-22 DIAGNOSIS — M545 Low back pain: Secondary | ICD-10-CM | POA: Diagnosis not present

## 2019-03-22 DIAGNOSIS — M7918 Myalgia, other site: Secondary | ICD-10-CM | POA: Diagnosis not present

## 2019-03-22 DIAGNOSIS — R413 Other amnesia: Secondary | ICD-10-CM | POA: Diagnosis not present

## 2019-03-22 DIAGNOSIS — G8929 Other chronic pain: Secondary | ICD-10-CM | POA: Diagnosis not present

## 2019-03-22 DIAGNOSIS — M5481 Occipital neuralgia: Secondary | ICD-10-CM | POA: Diagnosis not present

## 2019-03-22 DIAGNOSIS — G43719 Chronic migraine without aura, intractable, without status migrainosus: Secondary | ICD-10-CM | POA: Diagnosis not present

## 2019-03-28 DIAGNOSIS — M542 Cervicalgia: Secondary | ICD-10-CM | POA: Diagnosis not present

## 2019-03-28 DIAGNOSIS — M5481 Occipital neuralgia: Secondary | ICD-10-CM | POA: Diagnosis not present

## 2019-04-09 ENCOUNTER — Telehealth: Payer: Self-pay

## 2019-04-09 ENCOUNTER — Other Ambulatory Visit: Payer: Self-pay | Admitting: Psychiatry

## 2019-04-09 ENCOUNTER — Ambulatory Visit: Payer: Medicare HMO | Admitting: Psychiatry

## 2019-04-09 DIAGNOSIS — F251 Schizoaffective disorder, depressive type: Secondary | ICD-10-CM

## 2019-04-09 DIAGNOSIS — E538 Deficiency of other specified B group vitamins: Secondary | ICD-10-CM | POA: Diagnosis not present

## 2019-04-09 MED ORDER — OLANZAPINE 5 MG PO TABS: 2.5000 mg | ORAL_TABLET | Freq: Every day | ORAL | 1 refills | Status: DC

## 2019-04-09 NOTE — Telephone Encounter (Signed)
Medication refill request - Patient left a message she is in need of a new Olanzapine refill.  Even with reduced dosage to 2.5 of a 5 mg tablet pt is now running out. Last seen 03/13/19 and does not return until 05/29/19. Last ordered 11/06/18 for 90 days.

## 2019-04-09 NOTE — Telephone Encounter (Signed)
I have sent olanzapine to pharmacy. 

## 2019-04-17 DIAGNOSIS — M1731 Unilateral post-traumatic osteoarthritis, right knee: Secondary | ICD-10-CM | POA: Diagnosis not present

## 2019-04-17 DIAGNOSIS — M79605 Pain in left leg: Secondary | ICD-10-CM | POA: Diagnosis not present

## 2019-05-07 DIAGNOSIS — G8929 Other chronic pain: Secondary | ICD-10-CM | POA: Diagnosis not present

## 2019-05-07 DIAGNOSIS — E1165 Type 2 diabetes mellitus with hyperglycemia: Secondary | ICD-10-CM | POA: Diagnosis not present

## 2019-05-07 DIAGNOSIS — M545 Low back pain: Secondary | ICD-10-CM | POA: Diagnosis not present

## 2019-05-07 DIAGNOSIS — N1831 Chronic kidney disease, stage 3a: Secondary | ICD-10-CM | POA: Diagnosis not present

## 2019-05-07 DIAGNOSIS — M62838 Other muscle spasm: Secondary | ICD-10-CM | POA: Diagnosis not present

## 2019-05-07 DIAGNOSIS — I10 Essential (primary) hypertension: Secondary | ICD-10-CM | POA: Diagnosis not present

## 2019-05-07 DIAGNOSIS — Z6841 Body Mass Index (BMI) 40.0 and over, adult: Secondary | ICD-10-CM | POA: Diagnosis not present

## 2019-05-08 DIAGNOSIS — D1801 Hemangioma of skin and subcutaneous tissue: Secondary | ICD-10-CM | POA: Diagnosis not present

## 2019-05-14 DIAGNOSIS — Z0000 Encounter for general adult medical examination without abnormal findings: Secondary | ICD-10-CM | POA: Diagnosis not present

## 2019-05-14 DIAGNOSIS — F251 Schizoaffective disorder, depressive type: Secondary | ICD-10-CM | POA: Diagnosis not present

## 2019-05-14 DIAGNOSIS — I10 Essential (primary) hypertension: Secondary | ICD-10-CM | POA: Diagnosis not present

## 2019-05-14 DIAGNOSIS — G20 Parkinson's disease: Secondary | ICD-10-CM | POA: Diagnosis not present

## 2019-05-14 DIAGNOSIS — Z79899 Other long term (current) drug therapy: Secondary | ICD-10-CM | POA: Diagnosis not present

## 2019-05-14 DIAGNOSIS — E1165 Type 2 diabetes mellitus with hyperglycemia: Secondary | ICD-10-CM | POA: Diagnosis not present

## 2019-05-14 DIAGNOSIS — R3129 Other microscopic hematuria: Secondary | ICD-10-CM | POA: Diagnosis not present

## 2019-05-14 DIAGNOSIS — R519 Headache, unspecified: Secondary | ICD-10-CM | POA: Diagnosis not present

## 2019-05-14 DIAGNOSIS — Z6841 Body Mass Index (BMI) 40.0 and over, adult: Secondary | ICD-10-CM | POA: Diagnosis not present

## 2019-05-14 DIAGNOSIS — E538 Deficiency of other specified B group vitamins: Secondary | ICD-10-CM | POA: Diagnosis not present

## 2019-05-25 ENCOUNTER — Encounter: Payer: Self-pay | Admitting: Urology

## 2019-05-25 ENCOUNTER — Ambulatory Visit: Payer: Medicare HMO | Admitting: Urology

## 2019-05-25 ENCOUNTER — Other Ambulatory Visit: Payer: Self-pay

## 2019-05-25 VITALS — BP 149/89 | HR 94 | Ht 65.0 in | Wt 238.8 lb

## 2019-05-25 DIAGNOSIS — R310 Gross hematuria: Secondary | ICD-10-CM

## 2019-05-25 DIAGNOSIS — N898 Other specified noninflammatory disorders of vagina: Secondary | ICD-10-CM | POA: Diagnosis not present

## 2019-05-25 DIAGNOSIS — N3281 Overactive bladder: Secondary | ICD-10-CM

## 2019-05-25 DIAGNOSIS — N189 Chronic kidney disease, unspecified: Secondary | ICD-10-CM | POA: Diagnosis not present

## 2019-05-25 LAB — URINALYSIS, COMPLETE
Bilirubin, UA: NEGATIVE
Glucose, UA: NEGATIVE
Ketones, UA: NEGATIVE
Nitrite, UA: NEGATIVE
Protein,UA: NEGATIVE
Specific Gravity, UA: >1.03 — ABNORMAL HIGH (ref 1.005–1.030)
Urobilinogen, Ur: 0.2 mg/dL (ref 0.2–1.0)
pH, UA: 5.5 (ref 5.0–7.5)

## 2019-05-25 LAB — MICROSCOPIC EXAMINATION

## 2019-05-25 NOTE — Progress Notes (Signed)
05/25/2019 8:47 AM   Heather Haley [birth year] [id number]  Referring provider: Tracie Harrier, MD [address] [facility] [place],  [place] [address]  Chief Complaint  Patient presents with  . Hematuria    HPI: Patient is a 59 -year-old female who presents today as a referral from their PCP, Dr. Ginette Pitman, for gross hematuria.    She passed two stones 6 to 8 months ago associated with gross hematuria.  Patient started seeing blood in her urine again two months ago.  She is having associated cramps in her vaginal area.      She does not have a prior history of recurrent urinary tract infections, trauma to the genitourinary tract or malignancies of the genitourinary tract.  She does not have a family medical history of nephrolithiasis, malignancies of the genitourinary tract or hematuria.  Today, she is having symptoms of frequent urination, dysuria, hesitancy, intermittency, and a weak urinary stream.  She states these symptoms have been occurring for quite sometime.  Her UA today demonstrates 11-30 RBC's.    She has been having a clear, odorless vaginal discharge for 4 months.  She is going to call her gynecologist for further evaluation of the discharge.  She has had a hysterectomy.    She is a former smoker.  She quit in 1996 after smoking 2 packs daily for 15 years.    PMH: Past Medical History:  Diagnosis Date  . Anxiety   . Asthma   . Chronic kidney disease   . Chronic pain    previously saw Dr. Consuela Mimes in pain clinic, then saw pain specialist in [place]  . Depression   . Diabetes mellitus ([place])   . Frequency of urination   . GERD (gastroesophageal reflux disease)   . Headache(784.0)   . High cholesterol   . Hypertension   . IBS (irritable bowel syndrome)   . Left ankle instability   . Left knee DJD   . Lumbar Degenerative Disc Disease of  10/11/2014  . Neuromuscular disorder ([place])   . Osteoarthritis of hip (Right) 05/05/2015  . Other  enthesopathy of ankle and tarsus 12/15/2009   Qualifier: Diagnosis of  By: Oneida Alar MD, KARL    . Parkinson's disease ([place])   . Peripheral sensory neuropathy (Bilateral) 11/19/2014  . Postoperative nausea and vomiting   . Schizophrenia [facility])     Surgical History: Past Surgical History:  Procedure Laterality Date  . ABDOMINAL HYSTERECTOMY    . ANKLE SURGERY    . APPENDECTOMY    . COLONOSCOPY  2013  . COLONOSCOPY WITH PROPOFOL N/A 04/25/2017   Procedure: COLONOSCOPY WITH PROPOFOL;  Surgeon: Manya Silvas, MD;  Location: [facility] ENDOSCOPY;  Service: Endoscopy;  Laterality: N/A;  . ESOPHAGOGASTRODUODENOSCOPY (EGD) WITH PROPOFOL N/A 10/03/2014   Procedure: ESOPHAGOGASTRODUODENOSCOPY (EGD) WITH PROPOFOL;  Surgeon: Josefine Class, MD;  Location: [facility] ENDOSCOPY;  Service: Endoscopy;  Laterality: N/A;  . ESOPHAGOGASTRODUODENOSCOPY (EGD) WITH PROPOFOL  04/25/2017   Procedure: ESOPHAGOGASTRODUODENOSCOPY (EGD) WITH PROPOFOL;  Surgeon: Manya Silvas, MD;  Location: [facility] ENDOSCOPY;  Service: Endoscopy;;  . KNEE ARTHROSCOPY  1997   left knee  . LEFT HEART CATH AND CORONARY ANGIOGRAPHY Left 11/10/2017   Procedure: LEFT HEART CATH AND CORONARY ANGIOGRAPHY;  Surgeon: Isaias Cowman, MD;  Location: [facility] CV LAB;  Service: Cardiovascular;  Laterality: Left;  . LEFT HEART CATHETERIZATION WITH CORONARY ANGIOGRAM N/A 01/11/2013   Procedure: LEFT HEART CATHETERIZATION WITH CORONARY ANGIOGRAM;  Surgeon: Sinclair Grooms, MD;  Location: [place] CATH LAB;  Service: Cardiovascular;  Laterality: N/A;  . TOTAL KNEE ARTHROPLASTY  08/30/2011   Procedure: TOTAL KNEE ARTHROPLASTY;  Surgeon: Lorn Junes, MD;  Location: [place];  Service: Orthopedics;  Laterality: Left;    Home Medications:  Current Outpatient Medications on File Prior to Visit  Medication Sig Dispense Refill  . acetaminophen (TYLENOL) 500 MG tablet Take 500 mg by mouth every 6 (six) hours as needed.    Marland Kitchen albuterol (PROVENTIL  HFA;VENTOLIN HFA) 108 (90 Base) MCG/ACT inhaler Inhale 2 puffs into the lungs every 6 (six) hours as needed for wheezing or shortness of breath.    . Alcohol Swabs (B-D SINGLE USE SWABS REGULAR) PADS     . amantadine (SYMMETREL) 100 MG capsule Take 3 capsules (300 mg total) by mouth as directed. Take 2 capsules ( 200 mg) in the AM and 1 capsule (100 mg) pm 90 capsule 1  . aspirin 81 MG tablet Take 81 mg by mouth daily.     . Blood Glucose Calibration (ACCU-CHEK AVIVA) SOLN     . Blood Glucose Monitoring Suppl (ACCU-CHEK AVIVA PLUS) w/Device KIT     . carbidopa-levodopa (SINEMET IR) 25-100 MG tablet Take 1 tablet by mouth 3 (three) times daily.    . cetirizine (ZYRTEC) 10 MG tablet Take 10 mg by mouth daily.    . cholecalciferol (VITAMIN D) 1000 units tablet Take 1,000 Units by mouth daily.    . cyclobenzaprine (FLEXERIL) 10 MG tablet Take 10 mg by mouth 2 (two) times daily.     . cyclobenzaprine (FLEXERIL) 5 MG tablet     . diphenhydrAMINE (BENADRYL ALLERGY) 25 mg capsule Take 1 capsule (25 mg total) by mouth daily as needed. For side effects of tremors due to zyprexa 30 capsule 2  . docusate sodium (COLACE) 100 MG capsule Take 100 mg by mouth 2 (two) times daily.     Marland Kitchen HYDROcodone-acetaminophen (NORCO/VICODIN) 5-325 MG tablet Take 0.5 tablets by mouth every 12 (twelve) hours as needed for moderate pain.    . hydroxypropyl methylcellulose / hypromellose (ISOPTO TEARS / GONIOVISC) 2.5 % ophthalmic solution Place 1 drop into both eyes every 4 (four) hours as needed for dry eyes.    Marland Kitchen losartan (COZAAR) 25 MG tablet Take 25 mg by mouth daily.    . metFORMIN (GLUCOPHAGE) 500 MG tablet Take 1 tablet (500 mg total) by mouth 2 (two) times daily with a meal. For diabetes management 10 tablet 0  . montelukast (SINGULAIR) 10 MG tablet Take by mouth.    Marland Kitchen omeprazole (PRILOSEC) 40 MG capsule Take 40 mg by mouth daily as needed (heartburn).     . ondansetron (ZOFRAN) 4 MG tablet Take 4 mg by mouth every 8  (eight) hours as needed for nausea or vomiting.    . pravastatin (PRAVACHOL) 40 MG tablet Take 40 mg by mouth at bedtime.     . rizatriptan (MAXALT) 10 MG tablet Take 10 mg by mouth 2 (two) times daily. May repeat in 2 hours if needed     . rOPINIRole (REQUIP) 0.25 MG tablet Take 0.75 mg by mouth 3 (three) times daily.    Marland Kitchen senna (SENOKOT) 8.6 MG tablet Take 1-4 tablets (8.6-34.4 mg total) by mouth daily as needed for constipation. (Patient taking differently: Take 1 tablet by mouth at bedtime. )    . topiramate (TOPAMAX) 100 MG tablet Take 0.5 tablets (50 mg total) by mouth at bedtime. For mood control 30 tablet 1  . verapamil (  CALAN-SR) 120 MG CR tablet Take 1 tablet (120 mg total) by mouth at bedtime. For high blood pressure 90 tablet 1  . vitamin B-12 (CYANOCOBALAMIN) 1000 MCG tablet Take 1,000 mcg by mouth daily.    Marland Kitchen glimepiride (AMARYL) 1 MG tablet Take 1 mg by mouth daily with breakfast.     . traMADol (ULTRAM) 50 MG tablet Take 2 tablets (100 mg total) by mouth 2 (two) times daily for 15 days. 30 tablet 1   No current facility-administered medications on file prior to visit.    Allergies:  Allergies  Allergen Reactions  . Prolixin Decanoate [Fluphenazine]   . Benztropine Other (See Comments)    Hair fall out  Suicide thoughts  . Buprenorphine Hcl Other (See Comments)    Unable to void  . Carbidopa-Levodopa Nausea Only  . Codeine Hives    REACTION: hives  . Duloxetine Other (See Comments)    Altered mental status Alopecia, visual hallucinations, nightmares  . Gabapentin Swelling  . Lyrica [Pregabalin] Other (See Comments)    Alopecia, visual hallucinations, nightmares Altered mental status  . Morphine And Related Other (See Comments)    Unable to void  . Nortripytline Hcl [Nortriptyline] Other (See Comments)    Hair loss and night mares   . Nsaids     REACTION: palpitations, diaphoresis  . Penicillins Nausea And Vomiting and Other (See Comments)    REACTION: upset  stomach Has patient had a PCN reaction causing immediate rash, facial/tongue/throat swelling, SOB or lightheadedness with hypotension: No Has patient had a PCN reaction causing severe rash involving mucus membranes or skin necrosis: No Has patient had a PCN reaction that required hospitalization: No Has patient had a PCN reaction occurring within the last 10 years: No If all of the above answers are "NO", then may proceed with Cephalosporin use.  . Tolmetin Other (See Comments)    REACTION: palpitations, diaphoresis  . Wellbutrin [Bupropion]     Constipation, mood swings    Family History: Family History  Problem Relation Age of Onset  . Cancer Mother   . Hypertension Father   . Heart disease Father   . Alcohol abuse Father   . Breast cancer Neg Hx     Social History:  reports that she quit smoking about 24 years ago. Her smoking use included cigarettes. She has a 30.00 pack-year smoking history. She has never used smokeless tobacco. She reports that she does not drink alcohol or use drugs.  ROS: For pertinent review of systems please refer to history of present illness  Physical Exam: BP (!) 149/89   Pulse 94   Ht 5' 5" (1.651 m)   Wt 238 lb 12.8 oz (108.3 kg)   BMI 39.74 kg/m   Constitutional:  Well nourished. Alert and oriented, No acute distress. HEENT: Man AT, mask in place.  Trachea midline, no masses. Cardiovascular: No clubbing, cyanosis, or edema. Respiratory: Normal respiratory effort, no increased work of breathing. Neurologic: Grossly intact, no focal deficits, moving all 4 extremities. Psychiatric: Normal mood and affect.  Laboratory Data: Lab Results  Component Value Date   WBC 10.4 08/21/2018   HGB 15.8 (H) 08/21/2018   HCT 50.2 (H) 08/21/2018   MCV 91.1 08/21/2018   PLT 284 08/21/2018    Lab Results  Component Value Date   CREATININE 0.98 05/25/2019    Lab Results  Component Value Date   HGBA1C 6.2 (H) 03/06/2017    Lab Results  Component  Value Date   TSH 0.959  08/22/2018       Component Value Date/Time   CHOL 146 08/22/2018 1435   HDL 49 08/22/2018 1435   CHOLHDL 3.0 08/22/2018 1435   VLDL 34 08/22/2018 1435   LDLCALC 63 08/22/2018 1435    Lab Results  Component Value Date   AST 36 08/21/2018   Lab Results  Component Value Date   ALT 39 08/21/2018     Urinalysis Component     Latest Ref Rng & Units 05/25/2019  Specific Gravity, UA     1.005 - 1.030 >1.030 (H)  pH, UA     5.0 - 7.5 5.5  Color, UA     Yellow Yellow  Appearance Ur     Clear Cloudy (A)  Leukocytes,UA     Negative 2+ (A)  Protein,UA     Negative/Trace Negative  Glucose, UA     Negative Negative  Ketones, UA     Negative Negative  RBC, UA     Negative 2+ (A)  Bilirubin, UA     Negative Negative  Urobilinogen, Ur     0.2 - 1.0 mg/dL 0.2  Nitrite, UA     Negative Negative  Microscopic Examination      See below:   Component     Latest Ref Rng & Units 05/25/2019          WBC, UA     0 - 5 /hpf 6-10 (A)  RBC     0 - 2 /hpf 11-30 (A)  Epithelial Cells (non renal)     0 - 10 /hpf 0-10  Renal Epithel, UA     None seen /hpf 0-10 (A)  Bacteria, UA     None seen/Few Few   I have reviewed the labs.  Pertinent Imaging: No recent imaging.   Assessment & Plan:    1. Gross hematuria Explained to the patient that there are a number of causes that can be associated with blood in the urine, such as stones, UTI's, damage to the urinary tract and/or cancer. At this time, I felt that the patient warranted further urologic evaluation.   The AUA guidelines stratify her into the high risk group and a CT urogram is the preferred imaging study to evaluate hematuria. I explained to the patient that a contrast material will be injected into a vein and that in rare instances, an allergic reaction can result and may even life threatening   The patient denies any allergies to contrast, iodine and/or seafood and is taking metformin. Following  the imaging study,  I've recommended a cystoscopy. I described how this is performed, typically in an office setting with a flexible cystoscope. We described the risks, benefits, and possible side effects, the most common of which is a minor amount of blood in the urine and/or burning which usually resolves in 24 to 48 hours.   The patient had the opportunity to ask questions which were answered. Based upon this discussion, the patient is willing to proceed. Therefore, I've ordered: a CT Urogram and cystoscopy. - The patient will return following all of the above for discussion of the results.  - UA  - Urine culture  - BUN + creatinine    2. OAB symptoms Will reassess upon completion of hematuria work up  3. Vaginal discharge Will schedule appointment with her gynecologist   Return for CT Urogram report and cystoscopy.  These notes generated with voice recognition software. I apologize for typographical errors.  Zara Council, [facility]  [address]  [place], [place] [address] [phone]

## 2019-05-26 LAB — BUN+CREAT
BUN/Creatinine Ratio: 12 (ref 12–28)
BUN: 12 mg/dL (ref 8–27)
Creatinine, Ser: 0.98 mg/dL (ref 0.57–1.00)
GFR calc Af Amer: 73 mL/min/{1.73_m2} (ref >59)
GFR calc non Af Amer: 63 mL/min/{1.73_m2} (ref >59)

## 2019-05-28 DIAGNOSIS — M67431 Ganglion, right wrist: Secondary | ICD-10-CM | POA: Diagnosis not present

## 2019-05-29 ENCOUNTER — Other Ambulatory Visit: Payer: Self-pay

## 2019-05-29 ENCOUNTER — Encounter: Payer: Self-pay | Admitting: Psychiatry

## 2019-05-29 ENCOUNTER — Telehealth (INDEPENDENT_AMBULATORY_CARE_PROVIDER_SITE_OTHER): Payer: Medicare HMO | Admitting: Psychiatry

## 2019-05-29 DIAGNOSIS — G2119 Other drug induced secondary parkinsonism: Secondary | ICD-10-CM

## 2019-05-29 DIAGNOSIS — F4310 Post-traumatic stress disorder, unspecified: Secondary | ICD-10-CM

## 2019-05-29 DIAGNOSIS — F411 Generalized anxiety disorder: Secondary | ICD-10-CM

## 2019-05-29 DIAGNOSIS — F5105 Insomnia due to other mental disorder: Secondary | ICD-10-CM

## 2019-05-29 DIAGNOSIS — F251 Schizoaffective disorder, depressive type: Secondary | ICD-10-CM | POA: Diagnosis not present

## 2019-05-29 MED ORDER — OLANZAPINE 5 MG PO TABS: 2.5000 mg | ORAL_TABLET | Freq: Every day | ORAL | 1 refills | Status: DC

## 2019-05-29 NOTE — Patient Instructions (Signed)
Follow-up in clinic on May 17 at 10:20 AM

## 2019-05-29 NOTE — Progress Notes (Signed)
Provider Location : [facility] Patient Location : Home  Virtual Visit via Telephone Note  I connected with Heather Haley on 05/29/19 at  2:00 PM EDT by telephone and verified that I am speaking with the correct person using two identifiers.   I discussed the limitations, risks, security and privacy concerns of performing an evaluation and management service by telephone and the availability of in person appointments. I also discussed with the patient that there may be a patient responsible charge related to this service. The patient expressed understanding and agreed to proceed.    I discussed the assessment and treatment plan with the patient. The patient was provided an opportunity to ask questions and all were answered. The patient agreed with the plan and demonstrated an understanding of the instructions.   The patient was advised to call back or seek an in-person evaluation if the symptoms worsen or if the condition fails to improve as anticipated [facility] MD OP Progress Note  05/29/2019 5:18 PM Heather Haley  MRN:  [id number]  Chief Complaint:  Chief Complaint    Follow-up     HPI: Heather Haley is a 59 year old Caucasian female, lives in [place], has a history of schizoaffective disorder, PTSD, GAD, drug induced Parkinson's disease, migraine headaches, memory problems, diabetes melitis, chronic pain was evaluated by phone.  Patient was offered video call through my chart however patient was unable to connect.  Patient today reports mood wise she is doing okay.  She is compliant on all her medications.  She reports sleep is good.  She does report auditory hallucinations of hearing voices however reports she is able to cope with it.  She denies any other perceptual disturbances.  Patient reports she currently is struggling with blood in her urine.  She had appointment with her provider who has recommended further evaluation.  I have reviewed medical records in E HR dated 05/25/2019-per  [name] PA - " gross hematuria-likely associated with blood in urine such as stone, UTIs, damage to urinary tract or cancer at this time patient warranted further urologic evaluation.  Patient will return for UA, urine culture, BUN plus creatinine' .  Per review of note medication list indicates that Zoloft was discontinued.  However when this was discussed with patient she reports she continues to take Zoloft and that it was never mentioned to her.  I will coordinate care with her providers and will reach back to patient regarding replacement for Zoloft.  This was discussed with patient.    Visit Diagnosis:    ICD-10-CM   1. Schizoaffective disorder, depressive type ([place])  F25.1 OLANZapine (ZYPREXA) 5 MG tablet  2. PTSD (post-traumatic stress disorder)  F43.10   3. Generalized anxiety disorder  F41.1   4. Insomnia due to mental disorder  F51.05   5. Drug-induced Parkinsonism ([place])  G21.19     Past Psychiatric History: I have reviewed past psychiatric history from my progress note on 05/25/2017.  Past Medical History:  Past Medical History:  Diagnosis Date  . Anxiety   . Asthma   . Chronic kidney disease   . Chronic pain    previously saw Dr. Consuela Mimes in pain clinic, then saw pain specialist in [place]  . Depression   . Diabetes mellitus ([place])   . Frequency of urination   . GERD (gastroesophageal reflux disease)   . Headache(784.0)   . High cholesterol   . Hypertension   . IBS (irritable bowel syndrome)   . Left ankle instability   .  Left knee DJD   . Lumbar Degenerative Disc Disease of  10/11/2014  . Neuromuscular disorder ([place])   . Osteoarthritis of hip (Right) 05/05/2015  . Other enthesopathy of ankle and tarsus 12/15/2009   Qualifier: Diagnosis of  By: Oneida Alar MD, KARL    . Parkinson's disease ([place])   . Peripheral sensory neuropathy (Bilateral) 11/19/2014  . Postoperative nausea and vomiting   . Schizophrenia [facility])     Past Surgical History:  Procedure  Laterality Date  . ABDOMINAL HYSTERECTOMY    . ANKLE SURGERY    . APPENDECTOMY    . COLONOSCOPY  2013  . COLONOSCOPY WITH PROPOFOL N/A 04/25/2017   Procedure: COLONOSCOPY WITH PROPOFOL;  Surgeon: Manya Silvas, MD;  Location[facility] ENDOSCOPY;  Service: Endoscopy;  Laterality: N/A;  . ESOPHAGOGASTRODUODENOSCOPY (EGD) WITH PROPOFOL N/A 10/03/2014   Procedure: ESOPHAGOGASTRODUODENOSCOPY (EGD) WITH PROPOFOL;  Surgeon: Josefine Class, MD;  Location: [facility] ENDOSCOPY;  Service: Endoscopy;  Laterality: N/A;  . ESOPHAGOGASTRODUODENOSCOPY (EGD) WITH PROPOFOL  04/25/2017   Procedure: ESOPHAGOGASTRODUODENOSCOPY (EGD) WITH PROPOFOL;  Surgeon: Manya Silvas, MD;  Location: [facility] ENDOSCOPY;  Service: Endoscopy;;  . KNEE ARTHROSCOPY  1997   left knee  . LEFT HEART CATH AND CORONARY ANGIOGRAPHY Left 11/10/2017   Procedure: LEFT HEART CATH AND CORONARY ANGIOGRAPHY;  Surgeon: Isaias Cowman, MD;  Location: [place] CV LAB;  Service: Cardiovascular;  Laterality: Left;  . LEFT HEART CATHETERIZATION WITH CORONARY ANGIOGRAM N/A 01/11/2013   Procedure: LEFT HEART CATHETERIZATION WITH CORONARY ANGIOGRAM;  Surgeon: Sinclair Grooms, MD;  Location: [facility] CATH LAB;  Service: Cardiovascular;  Laterality: N/A;  . TOTAL KNEE ARTHROPLASTY  08/30/2011   Procedure: TOTAL KNEE ARTHROPLASTY;  Surgeon: Lorn Junes, MD;  Location: [place];  Service: Orthopedics;  Laterality: Left;    Family Psychiatric History: I have reviewed family psychiatric history from my progress note on 05/25/2017.  Family History:  Family History  Problem Relation Age of Onset  . Cancer Mother   . Hypertension Father   . Heart disease Father   . Alcohol abuse Father   . Breast cancer Neg Hx     Social History: Reviewed social history from my progress note on 05/25/2017. Social History   Socioeconomic History  . Marital status: Divorced    Spouse name: Not on file  . Number of children: 2  . Years of education: 70  . Highest  education level: 11th grade  Occupational History    Employer: [employer]  Tobacco Use  . Smoking status: Former Smoker    Packs/day: 2.00    Years: 15.00    Pack years: 30.00    Types: Cigarettes    Quit date: 02/09/1995    Years since quitting: 24.3  . Smokeless tobacco: Never Used  Substance and Sexual Activity  . Alcohol use: No    Alcohol/week: 0.0 standard drinks  . Drug use: No  . Sexual activity: Not Currently  Other Topics Concern  . Not on file  Social History Narrative   Patient lives at home alone. Patient works at [employer].   Caffeine daily- 2   Right handed.   Education- 11 th grade   Social Determinants of Radio broadcast assistant Strain:   . Difficulty of Paying Living Expenses:   Food Insecurity:   . Worried About Charity fundraiser in the Last Year:   . Arboriculturist in the Last Year:   Transportation Needs:   . Lack of Transportation (  Medical):   Marland Kitchen Lack of Transportation (Non-Medical):   Physical Activity:   . Days of Exercise per Week:   . Minutes of Exercise per Session:   Stress:   . Feeling of Stress :   Social Connections:   . Frequency of Communication with Friends and Family:   . Frequency of Social Gatherings with Friends and Family:   . Attends Religious Services:   . Active Member of Clubs or Organizations:   . Attends Archivist Meetings:   Marland Kitchen Marital Status:     Allergies:  Allergies  Allergen Reactions  . Prolixin Decanoate [Fluphenazine]   . Benztropine Other (See Comments)    Hair fall out  Suicide thoughts  . Buprenorphine Hcl Other (See Comments)    Unable to void  . Carbidopa-Levodopa Nausea Only  . Codeine Hives    REACTION: hives  . Duloxetine Other (See Comments)    Altered mental status Alopecia, visual hallucinations, nightmares  . Gabapentin Swelling  . Lyrica [Pregabalin] Other (See Comments)    Alopecia, visual hallucinations, nightmares Altered mental status  . Morphine And Related  Other (See Comments)    Unable to void  . Nortripytline Hcl [Nortriptyline] Other (See Comments)    Hair loss and night mares   . Nsaids     REACTION: palpitations, diaphoresis  . Penicillins Nausea And Vomiting and Other (See Comments)    REACTION: upset stomach Has patient had a PCN reaction causing immediate rash, facial/tongue/throat swelling, SOB or lightheadedness with hypotension: No Has patient had a PCN reaction causing severe rash involving mucus membranes or skin necrosis: No Has patient had a PCN reaction that required hospitalization: No Has patient had a PCN reaction occurring within the last 10 years: No If all of the above answers are "NO", then may proceed with Cephalosporin use.  . Tolmetin Other (See Comments)    REACTION: palpitations, diaphoresis  . Wellbutrin [Bupropion]     Constipation, mood swings    Metabolic Disorder Labs: Lab Results  Component Value Date   HGBA1C 6.2 (H) 03/06/2017   MPG 131.24 03/06/2017   MPG 108 05/17/2015   Lab Results  Component Value Date   PROLACTIN 64.2 (H) 03/06/2017   Lab Results  Component Value Date   CHOL 146 08/22/2018   TRIG 172 (H) 08/22/2018   HDL 49 08/22/2018   CHOLHDL 3.0 08/22/2018   VLDL 34 08/22/2018   LDLCALC 63 08/22/2018   LDLCALC 59 03/06/2017   Lab Results  Component Value Date   TSH 0.959 08/22/2018   TSH 2.766 08/24/2017    Therapeutic Level Labs: No results found for: LITHIUM No results found for: VALPROATE No components found for:  CBMZ  Current Medications: Current Outpatient Medications  Medication Sig Dispense Refill  . acetaminophen (TYLENOL) 500 MG tablet Take 500 mg by mouth every 6 (six) hours as needed.    Marland Kitchen albuterol (PROVENTIL HFA;VENTOLIN HFA) 108 (90 Base) MCG/ACT inhaler Inhale 2 puffs into the lungs every 6 (six) hours as needed for wheezing or shortness of breath.    . Alcohol Swabs (B-D SINGLE USE SWABS REGULAR) PADS     . amantadine (SYMMETREL) 100 MG capsule Take 3  capsules (300 mg total) by mouth as directed. Take 2 capsules ( 200 mg) in the AM and 1 capsule (100 mg) pm 90 capsule 1  . aspirin 81 MG tablet Take 81 mg by mouth daily.     . Blood Glucose Calibration (ACCU-CHEK AVIVA) SOLN     .  Blood Glucose Monitoring Suppl (ACCU-CHEK AVIVA PLUS) w/Device KIT     . carbidopa-levodopa (SINEMET IR) 25-100 MG tablet Take 1 tablet by mouth 3 (three) times daily.    . cetirizine (ZYRTEC) 10 MG tablet Take 10 mg by mouth daily.    . cholecalciferol (VITAMIN D) 1000 units tablet Take 1,000 Units by mouth daily.    . cyclobenzaprine (FLEXERIL) 10 MG tablet Take 10 mg by mouth 2 (two) times daily.     . cyclobenzaprine (FLEXERIL) 5 MG tablet     . diphenhydrAMINE (BENADRYL ALLERGY) 25 mg capsule Take 1 capsule (25 mg total) by mouth daily as needed. For side effects of tremors due to zyprexa 30 capsule 2  . docusate sodium (COLACE) 100 MG capsule Take 100 mg by mouth 2 (two) times daily.     Marland Kitchen doxycycline (VIBRA-TABS) 100 MG tablet Take 100 mg by mouth 2 (two) times daily.    Marland Kitchen glimepiride (AMARYL) 1 MG tablet Take 1 mg by mouth daily with breakfast.     . HYDROcodone-acetaminophen (NORCO/VICODIN) 5-325 MG tablet Take 0.5 tablets by mouth every 12 (twelve) hours as needed for moderate pain.    . hydroxypropyl methylcellulose / hypromellose (ISOPTO TEARS / GONIOVISC) 2.5 % ophthalmic solution Place 1 drop into both eyes every 4 (four) hours as needed for dry eyes.    Marland Kitchen losartan (COZAAR) 25 MG tablet Take 25 mg by mouth daily.    . metFORMIN (GLUCOPHAGE) 500 MG tablet Take 1 tablet (500 mg total) by mouth 2 (two) times daily with a meal. For diabetes management 10 tablet 0  . montelukast (SINGULAIR) 10 MG tablet Take by mouth.    . OLANZapine (ZYPREXA) 5 MG tablet Take 0.5 tablets (2.5 mg total) by mouth at bedtime. 45 tablet 1  . omeprazole (PRILOSEC) 40 MG capsule Take 40 mg by mouth daily as needed (heartburn).     . ondansetron (ZOFRAN) 4 MG tablet Take 4 mg by  mouth every 8 (eight) hours as needed for nausea or vomiting.    . pravastatin (PRAVACHOL) 40 MG tablet Take 40 mg by mouth at bedtime.     . rizatriptan (MAXALT) 10 MG tablet Take 10 mg by mouth 2 (two) times daily. May repeat in 2 hours if needed     . rOPINIRole (REQUIP) 0.25 MG tablet Take 0.75 mg by mouth 3 (three) times daily.    Marland Kitchen senna (SENOKOT) 8.6 MG tablet Take 1-4 tablets (8.6-34.4 mg total) by mouth daily as needed for constipation. (Patient taking differently: Take 1 tablet by mouth at bedtime. )    . topiramate (TOPAMAX) 100 MG tablet Take 0.5 tablets (50 mg total) by mouth at bedtime. For mood control 30 tablet 1  . traMADol (ULTRAM) 50 MG tablet Take 2 tablets (100 mg total) by mouth 2 (two) times daily for 15 days. 30 tablet 1  . verapamil (CALAN-SR) 120 MG CR tablet Take 1 tablet (120 mg total) by mouth at bedtime. For high blood pressure 90 tablet 1  . vitamin B-12 (CYANOCOBALAMIN) 1000 MCG tablet Take 1,000 mcg by mouth daily.     No current facility-administered medications for this visit.     Musculoskeletal: Strength & Muscle Tone: UTA Gait & Station: UTA Patient leans: N/A  Psychiatric Specialty Exam: Review of Systems  Genitourinary:       Blood in urine  Psychiatric/Behavioral: Positive for hallucinations. Negative for agitation, behavioral problems, confusion, decreased concentration, dysphoric mood, self-injury and sleep disturbance. The patient is not  nervous/anxious and is not hyperactive.   All other systems reviewed and are negative.   There were no vitals taken for this visit.There is no height or weight on file to calculate BMI.  General Appearance: UTA  Eye Contact:  UTA  Speech:  Clear and Coherent  Volume:  Normal  Mood:  Euthymic  Affect:  UTA  Thought Process:  Goal Directed and Descriptions of Associations: Intact  Orientation:  Full (Time, Place, and Person)  Thought Content: Hallucinations: Auditory does not bother her  Suicidal Thoughts:   No  Homicidal Thoughts:  No  Memory:  Immediate;   Fair Recent;   Fair Remote;   Fair  Judgement:  Fair  Insight:  Fair  Psychomotor Activity:  UTA  Concentration:  Concentration: Fair and Attention Span: Fair  Recall:  [employer] of Knowledge: Fair  Language: Fair  Akathisia:  No  Handed:  Right  AIMS (if indicated): UTA  Assets:  Communication Skills Desire for Improvement Social Support  ADL's:  Intact  Cognition: WNL  Sleep:  Fair   Screenings: AIMS     Admission (Discharged) from OP Visit from 08/30/2018 in [place] Admission (Discharged) from 08/22/2018 in [facility] [facility] Office Visit from 12/01/2017 in [place] Office Visit from 09/20/2017 in [place] Office Visit from 06/29/2017 in [place] Total Score  0  0  1  0  6    AUDIT     Admission (Discharged) from OP Visit from 08/30/2018 in [place] Admission (Discharged) from 08/22/2018 in [place] [facility] Admission (Discharged) from 03/03/2017 in [place] [facility]  Alcohol Use Disorder Identification Test Final Score (AUDIT)  0  0  0    GAD-7     Office Visit from 08/22/2015 in [place] at [facility]  Total GAD-7 Score  6    PHQ2-9     Office Visit from 03/23/2016 in [place] Office Visit from 08/22/2015 in [place] at [facility] from 08/06/2015 in [place] Office Visit from 06/10/2015 in [place] Clinical Support from 05/19/2015 in [place]  PHQ-2 Total Score  0  1  0  0  0       Assessment and Plan: Heather Haley is a 59 year old Caucasian female who has a history of schizoaffective disorder, PTSD,  migraine headaches, drug induced Parkinson's disease was evaluated by phone today.  Patient is currently stable on current medication regimen with regards to her mood.  She however does have psychosocial stressors of urinary/bladder problems and she will continue to follow-up with her providers for the same.  Plan as noted below.  Plan Schizoaffective disorder-stable Zyprexa 2.5 mg p.o. nightly-reduced dosage. Patient reports she continues to take Zoloft 100 mg p.o. daily.  However it was discussed with patient that based on her most recent visit with [facility] urological associates her Zoloft was discontinued.  Patient however reports she was never informed about this.  I have tried to reach out to [facility] neurological associate-provider.  Discussed with patient once I can coordinate care will let patient know about replacement for her Zoloft. Amantadine 300 mg p.o. daily in divided dosage  PTSD-stable Zoloft as prescribed  GAD-stable Patient was referred for CBT-noncompliant.  Neuroleptic induced Parkinson's disease-improving Amantadine as prescribed  I have reviewed medical records in E HR dated 05/25/2019-[place] neurological associate as summarized above.  I have also sent a message to [place] -regarding patient's Zoloft.  Follow-up in clinic in 4 weeks or sooner if needed.  I have spent atleast 30 minutes non face to face with patient today. More than 50 % of the time was spent for preparing to see the patient ( e.g., review of test, records ), obtaining and to review and separately obtained history , ordering medications and test ,psychoeducation and supportive psychotherapy and care coordination,as well as documenting clinical information in electronic health record. This note was generated in part or whole with voice recognition software. Voice recognition is usually quite accurate but there are transcription errors that can and very often do occur. I apologize for any  typographical errors that were not detected and corrected.   Addendum- 5:15 pm - I have communicated with [facility] neurological associates and per her provider they have not discontinue the Zoloft.  I have reached out to [place] a voicemail.    Heather Haley Alert, MD 05/29/2019, 5:18 PM

## 2019-05-30 LAB — CULTURE, URINE COMPREHENSIVE

## 2019-06-08 ENCOUNTER — Ambulatory Visit
Admission: RE | Admit: 2019-06-08 | Discharge: 2019-06-08 | Disposition: A | Discharge status: Home / Self Care | Payer: Medicare HMO | Source: Ambulatory Visit | Attending: Urology | Admitting: Urology

## 2019-06-08 ENCOUNTER — Other Ambulatory Visit: Payer: Self-pay

## 2019-06-08 DIAGNOSIS — R310 Gross hematuria: Secondary | ICD-10-CM | POA: Diagnosis not present

## 2019-06-08 DIAGNOSIS — N202 Calculus of kidney with calculus of ureter: Secondary | ICD-10-CM | POA: Diagnosis not present

## 2019-06-08 MED ORDER — IOHEXOL 300 MG/ML  SOLN
125.0000 mL | Freq: Once | INTRAMUSCULAR | Status: AC | PRN
  Administered 2019-06-08: 125 mL via INTRAVENOUS

## 2019-06-21 DIAGNOSIS — G43719 Chronic migraine without aura, intractable, without status migrainosus: Secondary | ICD-10-CM | POA: Diagnosis not present

## 2019-06-21 DIAGNOSIS — G2119 Other drug induced secondary parkinsonism: Secondary | ICD-10-CM | POA: Diagnosis not present

## 2019-06-21 DIAGNOSIS — E538 Deficiency of other specified B group vitamins: Secondary | ICD-10-CM | POA: Diagnosis not present

## 2019-06-21 DIAGNOSIS — M7918 Myalgia, other site: Secondary | ICD-10-CM | POA: Diagnosis not present

## 2019-06-21 DIAGNOSIS — M5481 Occipital neuralgia: Secondary | ICD-10-CM | POA: Diagnosis not present

## 2019-06-25 ENCOUNTER — Ambulatory Visit: Payer: Medicare HMO | Admitting: Urology

## 2019-06-25 ENCOUNTER — Other Ambulatory Visit: Payer: Self-pay

## 2019-06-25 ENCOUNTER — Encounter: Payer: Self-pay | Admitting: Urology

## 2019-06-25 ENCOUNTER — Telehealth (INDEPENDENT_AMBULATORY_CARE_PROVIDER_SITE_OTHER): Payer: Medicare HMO | Admitting: Psychiatry

## 2019-06-25 VITALS — BP 125/81 | HR 99 | Ht 65.0 in | Wt 240.0 lb

## 2019-06-25 DIAGNOSIS — Z5329 Procedure and treatment not carried out because of patient's decision for other reasons: Secondary | ICD-10-CM

## 2019-06-25 DIAGNOSIS — N200 Calculus of kidney: Secondary | ICD-10-CM

## 2019-06-25 DIAGNOSIS — R310 Gross hematuria: Secondary | ICD-10-CM

## 2019-06-25 DIAGNOSIS — R3129 Other microscopic hematuria: Secondary | ICD-10-CM | POA: Diagnosis not present

## 2019-06-25 LAB — URINALYSIS, COMPLETE
Bilirubin, UA: NEGATIVE
Nitrite, UA: NEGATIVE
Specific Gravity, UA: 1.025 (ref 1.005–1.030)
Urobilinogen, Ur: 0.2 mg/dL (ref 0.2–1.0)
pH, UA: 6.5 (ref 5.0–7.5)

## 2019-06-25 LAB — MICROSCOPIC EXAMINATION: RBC, Urine: >30 /hpf — AB (ref 0–2)

## 2019-06-25 NOTE — Progress Notes (Signed)
06/25/2019  CC:  Chief Complaint  Patient presents with  . Cysto    Indications: Gross hematuria   HPI:  Heather Haley is a 59 y/o white female who presents today for cystoscopy.  See Heather Haley's note 05/25/2019   Vitals:   BP 125/81  Pulse 99  Ht 5\' 5"  (1.651 m)  Wt 240 lb (108.9 kg)  BMI 39.94 kg/m  BSA 2.23 m   NED. A&Ox3.   No respiratory distress   Abd soft, NT, ND Atrophic external genitalia with patent urethral meatus  Cystoscopy Procedure Note  Patient identification was confirmed, informed consent was obtained, and patient was prepped using Betadine solution.  Lidocaine jelly was administered per urethral meatus.    Procedure: - Flexible cystoscope introduced, without any difficulty.   - Thorough search of the bladder revealed:    normal urethral meatus    normal urothelium    no stones    no ulcers     no tumors    no urethral polyps    no trabeculation  - Ureteral orifices were normal in position and appearance.  -Retroflexion with no abnormalities  Post-Procedure: - Patient tolerated the procedure well  Imaging: CT images personally reviewed CLINICAL DATA:  Gross hematuria and pelvic cramping for several months.  EXAM: CT ABDOMEN AND PELVIS WITHOUT AND WITH CONTRAST  TECHNIQUE: Multidetector CT imaging of the abdomen and pelvis was performed following the standard protocol before and following the bolus administration of intravenous contrast.  CONTRAST:  135mL OMNIPAQUE IOHEXOL 300 MG/ML  SOLN  COMPARISON:  None.  FINDINGS: Lower chest: Lung bases are clear.  Hepatobiliary: Low-attenuation liver suggests hepatic steatosis. Normal gallbladder.  Pancreas: Pancreas is normal. No ductal dilatation. No pancreatic inflammation.  Spleen: Normal spleen  Adrenals/urinary tract: Adrenal glands normal. Small 2 mm nonobstructing RIGHT upper pole renal calculus. 3 mm nonobstructing LEFT renal calculus.  There is a  mildly obstructing calculus at the LEFT ureteropelvic junction measuring 3 mm in axial dimension (image 46/2). This calculus is well seen on coronal image 90/5. No hydronephrosis. Mild pelvic prominence proximal to the stone. The LEFT ureter distal to the UPJ stone demonstrates urothelial enhancement (image 92/12).  No distal RIGHT ureteral or distal LEFT ureteral calculi. No bladder calculi.  No enhancing renal cortical lesion. Small probable benign cyst in lower pole the RIGHT kidney.  No bladder calculi, enhancing bladder lesions, or filling defect within the bladder.  Stomach/Bowel: Stomach, small bowel, appendix, and cecum are normal. The colon and rectosigmoid colon are normal.  Vascular/Lymphatic: Abdominal aorta is normal caliber. No periportal or retroperitoneal adenopathy. No pelvic adenopathy.  Reproductive: Post hysterectomy.  No adnexal abnormality.  Other: No free fluid.  Musculoskeletal: No aggressive osseous lesion.  IMPRESSION: 1. Minimally obstructing calculus at the LEFT ureteropelvic junction. 2. LEFT ureter urothelial enhancement distal to the UPJ calculus suggest ureteral inflammation or infection. 3. Bilateral nonobstructing renal calculi. 4. No bladder calculi. 5. No enhancing renal lesion.   Electronically Signed   By: Suzy Bouchard M.D.   On: 06/08/2019 15:33   Assessment/ Plan: -No mucosal abnormalities identified on cystoscopy -CT with bilateral renal calculi.  CT report refers to a 3 mm minimally obstructing UPJ calculus however on coronal images the calculus measures 4 x 9 mm.  She is minimally symptomatic and will have her follow-up with [facility] for a KUB.  Instructed to call earlier for development of left renal colic -Urine cytology sent -She declined treatment for her OAB symptoms at this  time - KUB in 41-month with Heather Haley, am acting as a Education administrator for Dr. Nicki Reaper C. Stoioff.  I have reviewed the above  documentation for accuracy and completeness, and I agree with the above.   Heather Sons, MD

## 2019-06-25 NOTE — Progress Notes (Signed)
No response to call or text.No response to video invite.

## 2019-07-02 ENCOUNTER — Other Ambulatory Visit: Payer: Self-pay | Admitting: Urology

## 2019-07-04 ENCOUNTER — Telehealth (INDEPENDENT_AMBULATORY_CARE_PROVIDER_SITE_OTHER): Payer: Medicare HMO | Admitting: Psychiatry

## 2019-07-04 ENCOUNTER — Encounter: Payer: Self-pay | Admitting: Psychiatry

## 2019-07-04 ENCOUNTER — Other Ambulatory Visit: Payer: Self-pay

## 2019-07-04 DIAGNOSIS — G2119 Other drug induced secondary parkinsonism: Secondary | ICD-10-CM | POA: Diagnosis not present

## 2019-07-04 DIAGNOSIS — F411 Generalized anxiety disorder: Secondary | ICD-10-CM

## 2019-07-04 DIAGNOSIS — F251 Schizoaffective disorder, depressive type: Secondary | ICD-10-CM

## 2019-07-04 DIAGNOSIS — F5105 Insomnia due to other mental disorder: Secondary | ICD-10-CM

## 2019-07-04 DIAGNOSIS — M7670 Peroneal tendinitis, unspecified leg: Secondary | ICD-10-CM | POA: Insufficient documentation

## 2019-07-04 DIAGNOSIS — F4310 Post-traumatic stress disorder, unspecified: Secondary | ICD-10-CM | POA: Diagnosis not present

## 2019-07-04 MED ORDER — SERTRALINE HCL 100 MG PO TABS: 150.0000 mg | ORAL_TABLET | Freq: Every day | ORAL | 1 refills | Status: DC

## 2019-07-04 MED ORDER — AMANTADINE HCL 100 MG PO CAPS: 100.0000 mg | ORAL_CAPSULE | Freq: Two times a day (BID) | ORAL | 2 refills | Status: DC

## 2019-07-04 NOTE — Progress Notes (Signed)
Provider Location : [facility] Patient Location : Home  Virtual Visit via Telephone Note  I connected with Heather Haley on 07/04/19 at 10:40 AM EDT by telephone and verified that I am speaking with the correct person using two identifiers.   I discussed the limitations, risks, security and privacy concerns of performing an evaluation and management service by telephone and the availability of in person appointments. I also discussed with the patient that there may be a patient responsible charge related to this service. The patient expressed understanding and agreed to proceed.   I discussed the assessment and treatment plan with the patient. The patient was provided an opportunity to ask questions and all were answered. The patient agreed with the plan and demonstrated an understanding of the instructions.   The patient was advised to call back or seek an in-person evaluation if the symptoms worsen or if the condition fails to improve as anticipated.   [place] MD OP Progress Note  07/04/2019 12:11 PM Heather Haley  MRN:  [id number]  Chief Complaint:  Chief Complaint    Follow-up     HPI: Heather Haley is a 59 year old Caucasian female, lives in [place], has a history of schizoaffective disorder, PTSD, GAD, drug induced Parkinson's disease, migraine headaches, memory problems, diabetes melitis, chronic pain was evaluated by phone today.  Patient preferred to do a phone call although she was offered a video call.  Patient today reports that she has noticed worsening anxiety symptoms in social situation.  She reports she becomes very shaky in public places and this has been getting worse since the past few weeks.  She continues to be compliant on the Zoloft which helps to some extent.  She reports sleep continues to be good.  Patient continues to have auditory hallucinations of hearing voices.  Most of the time she is able to cope with it however there are times when she feels it  is frustrating for her.  She continues to take low dosage of Zyprexa and did not tolerate the higher dosage previously.  Patient denies any suicidality or homicidality.  Patient is compliant on medications as prescribed.  Patient does report tremors which are currently stable on the current medication regimen.  Visit Diagnosis:    ICD-10-CM   1. Schizoaffective disorder, depressive type ([place])  F25.1 sertraline (ZOLOFT) 100 MG tablet  2. PTSD (post-traumatic stress disorder)  F43.10 sertraline (ZOLOFT) 100 MG tablet  3. Generalized anxiety disorder  F41.1 sertraline (ZOLOFT) 100 MG tablet  4. Insomnia due to mental disorder  F51.05   5. Drug-induced Parkinson's disease ([place])  G21.19 amantadine (SYMMETREL) 100 MG capsule   stable    Past Psychiatric History: I have reviewed past psychiatric history from my progress note on 05/25/2017  Past Medical History:  Past Medical History:  Diagnosis Date  . Anxiety   . Asthma   . Chronic kidney disease   . Chronic pain    previously saw Dr. Consuela Mimes in pain clinic, then saw pain specialist in [place]  . Depression   . Diabetes mellitus ([place])   . Frequency of urination   . GERD (gastroesophageal reflux disease)   . Headache(784.0)   . High cholesterol   . Hypertension   . IBS (irritable bowel syndrome)   . Left ankle instability   . Left knee DJD   . Lumbar Degenerative Disc Disease of  10/11/2014  . Neuromuscular disorder ([place])   . Osteoarthritis of hip (Right) 05/05/2015  . Other enthesopathy of  ankle and tarsus 12/15/2009   Qualifier: Diagnosis of  By: Oneida Alar MD, KARL    . Parkinson's disease ([place])   . Peripheral sensory neuropathy (Bilateral) 11/19/2014  . Postoperative nausea and vomiting   . Schizophrenia [facility])     Past Surgical History:  Procedure Laterality Date  . ABDOMINAL HYSTERECTOMY    . ANKLE SURGERY    . APPENDECTOMY    . COLONOSCOPY  2013  . COLONOSCOPY WITH PROPOFOL N/A 04/25/2017   Procedure: COLONOSCOPY WITH  PROPOFOL;  Surgeon: Manya Silvas, MD;  Location: [facility] ENDOSCOPY;  Service: Endoscopy;  Laterality: N/A;  . ESOPHAGOGASTRODUODENOSCOPY (EGD) WITH PROPOFOL N/A 10/03/2014   Procedure: ESOPHAGOGASTRODUODENOSCOPY (EGD) WITH PROPOFOL;  Surgeon: Josefine Class, MD;  Location: [facility] ENDOSCOPY;  Service: Endoscopy;  Laterality: N/A;  . ESOPHAGOGASTRODUODENOSCOPY (EGD) WITH PROPOFOL  04/25/2017   Procedure: ESOPHAGOGASTRODUODENOSCOPY (EGD) WITH PROPOFOL;  Surgeon: Manya Silvas, MD;  Location: [facility] ENDOSCOPY;  Service: Endoscopy;;  . KNEE ARTHROSCOPY  1997   left knee  . LEFT HEART CATH AND CORONARY ANGIOGRAPHY Left 11/10/2017   Procedure: LEFT HEART CATH AND CORONARY ANGIOGRAPHY;  Surgeon: Isaias Cowman, MD;  Location: [facility] CV LAB;  Service: Cardiovascular;  Laterality: Left;  . LEFT HEART CATHETERIZATION WITH CORONARY ANGIOGRAM N/A 01/11/2013   Procedure: LEFT HEART CATHETERIZATION WITH CORONARY ANGIOGRAM;  Surgeon: Sinclair Grooms, MD;  Location: [facility] CATH LAB;  Service: Cardiovascular;  Laterality: N/A;  . TOTAL KNEE ARTHROPLASTY  08/30/2011   Procedure: TOTAL KNEE ARTHROPLASTY;  Surgeon: Lorn Junes, MD;  Location: [facility];  Service: Orthopedics;  Laterality: Left;    Family Psychiatric History: I have reviewed family psychiatric history from my progress note on 05/25/2017  Family History:  Family History  Problem Relation Age of Onset  . Cancer Mother   . Hypertension Father   . Heart disease Father   . Alcohol abuse Father   . Breast cancer Neg Hx     Social History: I have reviewed social history from my progress note on 05/25/2017 Social History   Socioeconomic History  . Marital status: Divorced    Spouse name: Not on file  . Number of children: 2  . Years of education: 22  . Highest education level: 11th grade  Occupational History    Employer: [employer]  Tobacco Use  . Smoking status: Former Smoker    Packs/day: 2.00    Years: 15.00    Pack  years: 30.00    Types: Cigarettes    Quit date: 02/09/1995    Years since quitting: 24.4  . Smokeless tobacco: Never Used  Substance and Sexual Activity  . Alcohol use: No    Alcohol/week: 0.0 standard drinks  . Drug use: No  . Sexual activity: Not Currently  Other Topics Concern  . Not on file  Social History Narrative   Patient lives at home alone. Patient works at [employer].   Caffeine daily- 2   Right handed.   Education- 11 th grade   Social Determinants of Radio broadcast assistant Strain:   . Difficulty of Paying Living Expenses:   Food Insecurity:   . Worried About Charity fundraiser in the Last Year:   . Arboriculturist in the Last Year:   Transportation Needs:   . Film/video editor (Medical):   Marland Kitchen Lack of Transportation (Non-Medical):   Physical Activity:   . Days of Exercise per Week:   . Minutes of Exercise per Session:  Stress:   . Feeling of Stress :   Social Connections:   . Frequency of Communication with Friends and Family:   . Frequency of Social Gatherings with Friends and Family:   . Attends Religious Services:   . Active Member of Clubs or Organizations:   . Attends Archivist Meetings:   Marland Kitchen Marital Status:     Allergies:  Allergies  Allergen Reactions  . Prolixin Decanoate [Fluphenazine]   . Benztropine Other (See Comments)    Hair fall out  Suicide thoughts  . Buprenorphine Hcl Other (See Comments)    Unable to void  . Carbidopa-Levodopa Nausea Only  . Codeine Hives    REACTION: hives  . Duloxetine Other (See Comments)    Altered mental status Alopecia, visual hallucinations, nightmares  . Gabapentin Swelling  . Lyrica [Pregabalin] Other (See Comments)    Alopecia, visual hallucinations, nightmares Altered mental status  . Morphine And Related Other (See Comments)    Unable to void  . Nortripytline Hcl [Nortriptyline] Other (See Comments)    Hair loss and night mares   . Nsaids     REACTION: palpitations,  diaphoresis  . Penicillins Nausea And Vomiting and Other (See Comments)    REACTION: upset stomach Has patient had a PCN reaction causing immediate rash, facial/tongue/throat swelling, SOB or lightheadedness with hypotension: No Has patient had a PCN reaction causing severe rash involving mucus membranes or skin necrosis: No Has patient had a PCN reaction that required hospitalization: No Has patient had a PCN reaction occurring within the last 10 years: No If all of the above answers are "NO", then may proceed with Cephalosporin use.  . Tolmetin Other (See Comments)    REACTION: palpitations, diaphoresis  . Wellbutrin [Bupropion]     Constipation, mood swings    Metabolic Disorder Labs: Lab Results  Component Value Date   HGBA1C 6.2 (H) 03/06/2017   MPG 131.24 03/06/2017   MPG 108 05/17/2015   Lab Results  Component Value Date   PROLACTIN 64.2 (H) 03/06/2017   Lab Results  Component Value Date   CHOL 146 08/22/2018   TRIG 172 (H) 08/22/2018   HDL 49 08/22/2018   CHOLHDL 3.0 08/22/2018   VLDL 34 08/22/2018   LDLCALC 63 08/22/2018   LDLCALC 59 03/06/2017   Lab Results  Component Value Date   TSH 0.959 08/22/2018   TSH 2.766 08/24/2017    Therapeutic Level Labs: No results found for: LITHIUM No results found for: VALPROATE No components found for:  CBMZ  Current Medications: Current Outpatient Medications  Medication Sig Dispense Refill  . acetaminophen (TYLENOL) 500 MG tablet Take 500 mg by mouth every 6 (six) hours as needed.    Marland Kitchen albuterol (PROVENTIL HFA;VENTOLIN HFA) 108 (90 Base) MCG/ACT inhaler Inhale 2 puffs into the lungs every 6 (six) hours as needed for wheezing or shortness of breath.    . Alcohol Swabs (B-D SINGLE USE SWABS REGULAR) PADS     . amantadine (SYMMETREL) 100 MG capsule Take 1 capsule (100 mg total) by mouth 2 (two) times daily. 60 capsule 2  . aspirin 81 MG tablet Take 81 mg by mouth daily.     . Blood Glucose Calibration (ACCU-CHEK AVIVA)  SOLN     . Blood Glucose Monitoring Suppl (ACCU-CHEK AVIVA PLUS) w/Device KIT     . carbidopa-levodopa (SINEMET IR) 25-100 MG tablet Take 1 tablet by mouth 3 (three) times daily.    . cholecalciferol (VITAMIN D) 1000 units tablet Take 1,000  Units by mouth daily.    . cyclobenzaprine (FLEXERIL) 10 MG tablet Take 10 mg by mouth 2 (two) times daily.     . diphenhydrAMINE (BENADRYL ALLERGY) 25 mg capsule Take 1 capsule (25 mg total) by mouth daily as needed. For side effects of tremors due to zyprexa 30 capsule 2  . docusate sodium (COLACE) 100 MG capsule Take 100 mg by mouth 2 (two) times daily.     Marland Kitchen doxycycline (VIBRA-TABS) 100 MG tablet Take 100 mg by mouth 2 (two) times daily.    Marland Kitchen Galcanezumab-gnlm (EMGALITY) 120 MG/ML SOAJ Inject into the skin.    Marland Kitchen HYDROcodone-acetaminophen (NORCO/VICODIN) 5-325 MG tablet Take 0.5 tablets by mouth every 12 (twelve) hours as needed for moderate pain.    . hydroxypropyl methylcellulose / hypromellose (ISOPTO TEARS / GONIOVISC) 2.5 % ophthalmic solution Place 1 drop into both eyes every 4 (four) hours as needed for dry eyes.    Marland Kitchen losartan (COZAAR) 25 MG tablet Take 25 mg by mouth daily.    . metFORMIN (GLUCOPHAGE) 500 MG tablet Take 1 tablet (500 mg total) by mouth 2 (two) times daily with a meal. For diabetes management 10 tablet 0  . OLANZapine (ZYPREXA) 5 MG tablet Take 0.5 tablets (2.5 mg total) by mouth at bedtime. 45 tablet 1  . omeprazole (PRILOSEC) 40 MG capsule Take 40 mg by mouth daily as needed (heartburn).     . senna (SENOKOT) 8.6 MG tablet Take 1-4 tablets (8.6-34.4 mg total) by mouth daily as needed for constipation.    . sertraline (ZOLOFT) 100 MG tablet Take 1.5 tablets (150 mg total) by mouth daily. 135 tablet 1  . topiramate (TOPAMAX) 100 MG tablet Take 0.5 tablets (50 mg total) by mouth at bedtime. For mood control 30 tablet 1  . verapamil (CALAN-SR) 120 MG CR tablet Take 1 tablet (120 mg total) by mouth at bedtime. For high blood pressure 90  tablet 1  . vitamin B-12 (CYANOCOBALAMIN) 1000 MCG tablet Take 1,000 mcg by mouth daily.    . cetirizine (ZYRTEC) 10 MG tablet Take 10 mg by mouth daily.    . cyclobenzaprine (FLEXERIL) 5 MG tablet     . glimepiride (AMARYL) 1 MG tablet Take 1 mg by mouth daily with breakfast.     . montelukast (SINGULAIR) 10 MG tablet Take by mouth.    . ondansetron (ZOFRAN) 4 MG tablet Take 4 mg by mouth every 8 (eight) hours as needed for nausea or vomiting.    . pravastatin (PRAVACHOL) 40 MG tablet Take 40 mg by mouth at bedtime.     . rizatriptan (MAXALT) 10 MG tablet Take 10 mg by mouth 2 (two) times daily. May repeat in 2 hours if needed     . rOPINIRole (REQUIP) 0.25 MG tablet Take 0.75 mg by mouth 3 (three) times daily.    . traMADol (ULTRAM) 50 MG tablet Take 2 tablets (100 mg total) by mouth 2 (two) times daily for 15 days. 30 tablet 1   No current facility-administered medications for this visit.     Musculoskeletal: Strength & Muscle Tone: UTA Gait & Station: UTA Patient leans: N/A  Psychiatric Specialty Exam: Review of Systems  Psychiatric/Behavioral: Positive for hallucinations. The patient is nervous/anxious.   All other systems reviewed and are negative.   There were no vitals taken for this visit.There is no height or weight on file to calculate BMI.  General Appearance: UTA  Eye Contact:  UTA  Speech:  Clear and Coherent  Volume:  Normal  Mood:  Anxious  Affect:  UTA  Thought Process:  Goal Directed and Descriptions of Associations: Intact  Orientation:  Full (Time, Place, and Person)  Thought Content: Logical   Suicidal Thoughts:  No  Homicidal Thoughts:  No  Memory:  Immediate;   Fair Recent;   Fair Remote;   Fair  Judgement:  Fair  Insight:  Fair  Psychomotor Activity:  UTA  Concentration:  Concentration: Fair and Attention Span: Fair  Recall:  [employer] of Knowledge: Fair  Language: Fair  Akathisia:  No  Handed:  Right  AIMS (if indicated):UTA  Assets:   Communication Skills Desire for Improvement Housing Social Support  ADL's:  Intact  Cognition: WNL  Sleep:  Fair   Screenings: AIMS     Admission (Discharged) from OP Visit from 08/30/2018 in [facility] Admission (Discharged) from 08/22/2018 in [facility] Office Visit from 12/01/2017 in [place] Office Visit from 09/20/2017 in [place] Office Visit from 06/29/2017 in [facility] Total Score  0  0  1  0  6    AUDIT     Admission (Discharged) from OP Visit from 08/30/2018 in [place] Admission (Discharged) from 08/22/2018 in [place] [place] [facility] Admission (Discharged) from 03/03/2017 in [facility] [place] [facility]  Alcohol Use Disorder Identification Test Final Score (AUDIT)  0  0  0    GAD-7     Office Visit from 08/22/2015 in [place] at [facility]  Total GAD-7 Score  6    PHQ2-9     Office Visit from 03/23/2016 in [place] Office Visit from 08/22/2015 in [facility] [place] from 08/06/2015 in [place] Office Visit from 06/10/2015 in [place] Clinical Support from 05/19/2015 in [place]  PHQ-2 Total Score  0  1  0  0  0       Assessment and Plan: Heather Haley is a 59 year old Caucasian female who has a history of schizoaffective disorder, PTSD, migraine headaches, drug induced Parkinson's disease was evaluated by phone today.  Patient is currently stable on current medication regimen however does report auditory hallucinations which can be frustrating for her as well as anxiety symptoms in social situation.  Discussed plan as noted below.  Plan Schizoaffective  disorder-stable Zyprexa 2.5 mg p.o. nightly-reduced dosage.  She did not tolerate higher dosage. Will reduce amantadine to 100 mg p.o. twice daily since it could also possibly worsen her hallucinations at higher dosages. Increase Zoloft to 150 mg p.o. daily.  PTSD-unstable Increase Zoloft to 150 mg p.o. daily  GAD-stable Continue medications as prescribed  Neuroleptic induced Parkinson's disease-improving Reduce amantadine to 100 mg p.o. twice daily  Follow-up in clinic in 6 weeks or sooner if needed.  I have spent atleast 20 minutes non face to face with patient today. More than 50 % of the time was spent for preparing to see the patient ( e.g., review of test, records ),  ordering medications and test ,psychoeducation and supportive psychotherapy and care coordination,as well as documenting clinical information in electronic health record. This note was generated in part or whole with voice recognition software. Voice recognition is usually quite accurate but there are transcription errors that can and very often do  occur. I apologize for any typographical errors that were not detected and corrected.        Ursula Alert, MD 07/04/2019, 12:11 PM

## 2019-07-05 ENCOUNTER — Telehealth: Payer: Self-pay | Admitting: Urology

## 2019-07-05 NOTE — Telephone Encounter (Signed)
Urine cytology showed no abnormal cells 

## 2019-07-06 NOTE — Telephone Encounter (Signed)
Patient notified and voiced understanding.

## 2019-07-10 ENCOUNTER — Telehealth: Payer: Self-pay

## 2019-07-10 DIAGNOSIS — F251 Schizoaffective disorder, depressive type: Secondary | ICD-10-CM

## 2019-07-10 NOTE — Telephone Encounter (Signed)
received a fax requesting a refill on the olanzapine 5mg 

## 2019-07-10 NOTE — Telephone Encounter (Signed)
Olanzapine was sent to the local pharmacy for 90-day supply with refills and she is not due for refill yet

## 2019-07-11 DIAGNOSIS — G43011 Migraine without aura, intractable, with status migrainosus: Secondary | ICD-10-CM | POA: Diagnosis not present

## 2019-07-11 DIAGNOSIS — G501 Atypical facial pain: Secondary | ICD-10-CM | POA: Diagnosis not present

## 2019-07-18 DIAGNOSIS — G43719 Chronic migraine without aura, intractable, without status migrainosus: Secondary | ICD-10-CM | POA: Diagnosis not present

## 2019-07-23 DIAGNOSIS — Z79899 Other long term (current) drug therapy: Secondary | ICD-10-CM | POA: Diagnosis not present

## 2019-07-23 DIAGNOSIS — D649 Anemia, unspecified: Secondary | ICD-10-CM | POA: Diagnosis not present

## 2019-07-23 DIAGNOSIS — R0600 Dyspnea, unspecified: Secondary | ICD-10-CM | POA: Diagnosis not present

## 2019-07-23 DIAGNOSIS — E538 Deficiency of other specified B group vitamins: Secondary | ICD-10-CM | POA: Diagnosis not present

## 2019-07-23 DIAGNOSIS — R002 Palpitations: Secondary | ICD-10-CM | POA: Diagnosis not present

## 2019-07-23 DIAGNOSIS — R609 Edema, unspecified: Secondary | ICD-10-CM | POA: Diagnosis not present

## 2019-07-23 DIAGNOSIS — I10 Essential (primary) hypertension: Secondary | ICD-10-CM | POA: Diagnosis not present

## 2019-07-23 DIAGNOSIS — N1831 Chronic kidney disease, stage 3a: Secondary | ICD-10-CM | POA: Diagnosis not present

## 2019-07-25 DIAGNOSIS — G501 Atypical facial pain: Secondary | ICD-10-CM | POA: Diagnosis not present

## 2019-07-25 DIAGNOSIS — G43011 Migraine without aura, intractable, with status migrainosus: Secondary | ICD-10-CM | POA: Diagnosis not present

## 2019-07-25 DIAGNOSIS — G43719 Chronic migraine without aura, intractable, without status migrainosus: Secondary | ICD-10-CM | POA: Diagnosis not present

## 2019-07-26 DIAGNOSIS — G43719 Chronic migraine without aura, intractable, without status migrainosus: Secondary | ICD-10-CM | POA: Insufficient documentation

## 2019-07-31 ENCOUNTER — Other Ambulatory Visit: Payer: Self-pay | Admitting: Family Medicine

## 2019-07-31 ENCOUNTER — Ambulatory Visit: Payer: Medicare HMO | Admitting: Urology

## 2019-07-31 ENCOUNTER — Ambulatory Visit
Admission: RE | Admit: 2019-07-31 | Discharge: 2019-07-31 | Disposition: A | Discharge status: Home / Self Care | Payer: Medicare HMO | Attending: Urology | Admitting: Urology

## 2019-07-31 ENCOUNTER — Other Ambulatory Visit: Payer: Self-pay | Admitting: Urology

## 2019-07-31 ENCOUNTER — Encounter: Payer: Self-pay | Admitting: Urology

## 2019-07-31 ENCOUNTER — Other Ambulatory Visit: Payer: Self-pay

## 2019-07-31 ENCOUNTER — Ambulatory Visit
Admission: RE | Admit: 2019-07-31 | Discharge: 2019-07-31 | Disposition: A | Discharge status: Home / Self Care | Payer: Medicare HMO | Source: Ambulatory Visit | Attending: Urology | Admitting: Urology

## 2019-07-31 VITALS — BP 158/91 | HR 116 | Ht 65.0 in | Wt 240.0 lb

## 2019-07-31 DIAGNOSIS — N200 Calculus of kidney: Secondary | ICD-10-CM | POA: Diagnosis not present

## 2019-07-31 DIAGNOSIS — R310 Gross hematuria: Secondary | ICD-10-CM | POA: Diagnosis not present

## 2019-07-31 DIAGNOSIS — N3281 Overactive bladder: Secondary | ICD-10-CM | POA: Diagnosis not present

## 2019-07-31 DIAGNOSIS — N898 Other specified noninflammatory disorders of vagina: Secondary | ICD-10-CM

## 2019-07-31 MED ORDER — TAMSULOSIN HCL 0.4 MG PO CAPS
0.4000 mg | ORAL_CAPSULE | Freq: Every day | ORAL | 1 refills | Status: DC
Start: 2019-07-31 — End: 2019-07-31

## 2019-07-31 NOTE — Progress Notes (Signed)
07/10/19 8:50 PM   Heather Haley 1960/04/29 [id number]  Referring provider: Tracie Harrier, MD [address] [facility] [place],  [place] [address] Chief Complaint  Patient presents with  . Nephrolithiasis    HPI: Heather Haley is a 59 y.o. female with hematuria and nephrolithiasis who presents today for follow up KUB and UA.  History of hematuria (high risk) She is a former smoker. Her CT urogram on 06/08/19 showed a minimally obstructing calculus at the LEFT ureteropelvic Junction, LEFT ureter urothelial enhancement distal to the UPJ calculus suggest ureteral inflammation or infection and bilateral nephrolithiasis.  Her cystoscopy with Dr. Bernardo Heater on 06/25/2019 was NED.  Her urine cytology was negative.  She does not report gross hematuria.  Her UA is negative for micro heme.   Nephrolithiasis CTU in 05/2019 Small 2 mm nonobstructing RIGHT upper pole renal calculus. 3 mm nonobstructing LEFT renal calculus.   There is a mildly obstructing calculus at the LEFT ureteropelvic junction measuring 3 mm in axial dimension (image 46/2). This calculus is well seen on coronal image 90/5. No hydronephrosis.  Today's KUB notes migration of the left UPJ stone into the left ureter at the sacral ala.  She had one episode of intense pain two weeks ago that did not respond to tramadol or Vicodin.  She is having no pain today.  Her urinalysis today is unremarkable and negative for microscopic hemturia.  Patient denies any modifying or aggravating factors.  Patient denies any gross hematuria, dysuria or suprapubic/flank pain.  Patient denies any fevers, chills, nausea or vomiting.    PMH: Past Medical History:  Diagnosis Date  . Anxiety   . Asthma   . Chronic kidney disease   . Chronic pain    previously saw Dr. Consuela Mimes in pain clinic, then saw pain specialist in [place]  . Depression   . Diabetes mellitus ([place])   . Frequency of urination   . GERD (gastroesophageal  reflux disease)   . Headache(784.0)   . High cholesterol   . Hypertension   . IBS (irritable bowel syndrome)   . Left ankle instability   . Left knee DJD   . Lumbar Degenerative Disc Disease of  10/11/2014  . Neuromuscular disorder ([place])   . Osteoarthritis of hip (Right) 05/05/2015  . Other enthesopathy of ankle and tarsus 12/15/2009   Qualifier: Diagnosis of  By: Oneida Alar MD, KARL    . Parkinson's disease ([place])   . Peripheral sensory neuropathy (Bilateral) 11/19/2014  . Postoperative nausea and vomiting   . Schizophrenia [facility])     Surgical History: Past Surgical History:  Procedure Laterality Date  . ABDOMINAL HYSTERECTOMY    . ANKLE SURGERY    . APPENDECTOMY    . COLONOSCOPY  2013  . COLONOSCOPY WITH PROPOFOL N/A 04/25/2017   Procedure: COLONOSCOPY WITH PROPOFOL;  Surgeon: Manya Silvas, MD;  [facility] ENDOSCOPY;  Service: Endoscopy;  Laterality: N/A;  . ESOPHAGOGASTRODUODENOSCOPY (EGD) WITH PROPOFOL N/A 10/03/2014   Procedure: ESOPHAGOGASTRODUODENOSCOPY (EGD) WITH PROPOFOL;  Surgeon: Josefine Class, MD;  Location: [facility] ENDOSCOPY;  Service: Endoscopy;  Laterality: N/A;  . ESOPHAGOGASTRODUODENOSCOPY (EGD) WITH PROPOFOL  04/25/2017   Procedure: ESOPHAGOGASTRODUODENOSCOPY (EGD) WITH PROPOFOL;  Surgeon: Manya Silvas, MD;  Location: [facility] ENDOSCOPY;  Service: Endoscopy;;  . KNEE ARTHROSCOPY  1997   left knee  . LEFT HEART CATH AND CORONARY ANGIOGRAPHY Left 11/10/2017   Procedure: LEFT HEART CATH AND CORONARY ANGIOGRAPHY;  Surgeon: Isaias Cowman, MD;  Location: [place] CV  LAB;  Service: Cardiovascular;  Laterality: Left;  . LEFT HEART CATHETERIZATION WITH CORONARY ANGIOGRAM N/A 01/11/2013   Procedure: LEFT HEART CATHETERIZATION WITH CORONARY ANGIOGRAM;  Surgeon: Sinclair Grooms, MD;  Location: [facility] CATH LAB;  Service: Cardiovascular;  Laterality: N/A;  . TOTAL KNEE ARTHROPLASTY  08/30/2011   Procedure: TOTAL KNEE ARTHROPLASTY;  Surgeon: Lorn Junes, MD;   Location: [place];  Service: Orthopedics;  Laterality: Left;    Home Medications:  Allergies as of 07/31/2019      Reactions   Prolixin Decanoate [fluphenazine]    Benztropine Other (See Comments)   Hair fall out  Suicide thoughts   Buprenorphine Hcl Other (See Comments)   Unable to void   Carbidopa-levodopa Nausea Only   Codeine Hives   REACTION: hives   Duloxetine Other (See Comments)   Altered mental status Alopecia, visual hallucinations, nightmares   Gabapentin Swelling   Lyrica [pregabalin] Other (See Comments)   Alopecia, visual hallucinations, nightmares Altered mental status   Morphine And Related Other (See Comments)   Unable to void   Nortripytline Hcl [nortriptyline] Other (See Comments)   Hair loss and night mares   Nsaids    REACTION: palpitations, diaphoresis   Penicillins Nausea And Vomiting, Other (See Comments)   REACTION: upset stomach Has patient had a PCN reaction causing immediate rash, facial/tongue/throat swelling, SOB or lightheadedness with hypotension: No Has patient had a PCN reaction causing severe rash involving mucus membranes or skin necrosis: No Has patient had a PCN reaction that required hospitalization: No Has patient had a PCN reaction occurring within the last 10 years: No If all of the above answers are "NO", then may proceed with Cephalosporin use.   Tolmetin Other (See Comments)   REACTION: palpitations, diaphoresis   Wellbutrin [bupropion]    Constipation, mood swings      Medication List       Accurate as of July 31, 2019 11:59 PM. If you have any questions, ask your nurse or doctor.        STOP taking these medications   carbidopa-levodopa 25-100 MG tablet Commonly known as: SINEMET IR Stopped by: Viola Placeres, PA-C   cetirizine 10 MG tablet Commonly known as: ZYRTEC Stopped by: Pietra Zuluaga, PA-C   doxycycline 100 MG tablet Commonly known as: VIBRA-TABS Stopped by: Zara Council, PA-C     TAKE these  medications   Accu-Chek Aviva Plus w/Device Kit   Accu-Chek Aviva Soln   acetaminophen 500 MG tablet Commonly known as: TYLENOL Take 500 mg by mouth every 6 (six) hours as needed.   albuterol 108 (90 Base) MCG/ACT inhaler Commonly known as: VENTOLIN HFA Inhale 2 puffs into the lungs every 6 (six) hours as needed for wheezing or shortness of breath.   amantadine 100 MG capsule Commonly known as: SYMMETREL Take 1 capsule (100 mg total) by mouth 2 (two) times daily.   aspirin 81 MG tablet Take 81 mg by mouth daily.   B-D SINGLE USE SWABS REGULAR Pads   cholecalciferol 1000 units tablet Commonly known as: VITAMIN D Take 1,000 Units by mouth daily.   Colace 100 MG capsule Generic drug: docusate sodium Take 100 mg by mouth 2 (two) times daily.   cyclobenzaprine 10 MG tablet Commonly known as: FLEXERIL Take 10 mg by mouth 2 (two) times daily. What changed: Another medication with the same name was removed. Continue taking this medication, and follow the directions you see here. Changed by: Zara Council, PA-C   diphenhydrAMINE 25 mg  capsule Commonly known as: Benadryl Allergy Take 1 capsule (25 mg total) by mouth daily as needed. For side effects of tremors due to zyprexa   Emgality 120 MG/ML Soaj Generic drug: Galcanezumab-gnlm Inject into the skin.   glimepiride 1 MG tablet Commonly known as: AMARYL Take 1 mg by mouth daily with breakfast.   HYDROcodone-acetaminophen 5-325 MG tablet Commonly known as: NORCO/VICODIN Take 0.5 tablets by mouth every 12 (twelve) hours as needed for moderate pain.   hydroxypropyl methylcellulose / hypromellose 2.5 % ophthalmic solution Commonly known as: ISOPTO TEARS / GONIOVISC Place 1 drop into both eyes every 4 (four) hours as needed for dry eyes.   losartan 25 MG tablet Commonly known as: COZAAR Take 25 mg by mouth daily.   metFORMIN 500 MG tablet Commonly known as: GLUCOPHAGE Take 1 tablet (500 mg total) by mouth 2 (two)  times daily with a meal. For diabetes management   montelukast 10 MG tablet Commonly known as: SINGULAIR Take by mouth.   OLANZapine 5 MG tablet Commonly known as: ZyPREXA Take 0.5 tablets (2.5 mg total) by mouth at bedtime.   omeprazole 40 MG capsule Commonly known as: PRILOSEC Take 40 mg by mouth daily as needed (heartburn).   ondansetron 4 MG tablet Commonly known as: ZOFRAN Take 4 mg by mouth every 8 (eight) hours as needed for nausea or vomiting.   pravastatin 40 MG tablet Commonly known as: PRAVACHOL Take 40 mg by mouth at bedtime.   rizatriptan 10 MG tablet Commonly known as: MAXALT Take 10 mg by mouth 2 (two) times daily. May repeat in 2 hours if needed   rOPINIRole 0.25 MG tablet Commonly known as: REQUIP Take 0.75 mg by mouth 3 (three) times daily.   senna 8.6 MG tablet Commonly known as: SENOKOT Take 1-4 tablets (8.6-34.4 mg total) by mouth daily as needed for constipation.   sertraline 100 MG tablet Commonly known as: ZOLOFT Take 1.5 tablets (150 mg total) by mouth daily.   tamsulosin 0.4 MG Caps capsule Commonly known as: FLOMAX TAKE 1 CAPSULE(0.4 MG) BY MOUTH DAILY Started by: Larene Beach Kaylib Furness, PA-C   topiramate 100 MG tablet Commonly known as: TOPAMAX Take 0.5 tablets (50 mg total) by mouth at bedtime. For mood control   traMADol 50 MG tablet Commonly known as: ULTRAM Take 2 tablets (100 mg total) by mouth 2 (two) times daily for 15 days.   verapamil 120 MG CR tablet Commonly known as: CALAN-SR Take 1 tablet (120 mg total) by mouth at bedtime. For high blood pressure   vitamin B-12 1000 MCG tablet Commonly known as: CYANOCOBALAMIN Take 1,000 mcg by mouth daily.       Allergies:  Allergies  Allergen Reactions  . Prolixin Decanoate [Fluphenazine]   . Benztropine Other (See Comments)    Hair fall out  Suicide thoughts  . Buprenorphine Hcl Other (See Comments)    Unable to void  . Carbidopa-Levodopa Nausea Only  . Codeine Hives     REACTION: hives  . Duloxetine Other (See Comments)    Altered mental status Alopecia, visual hallucinations, nightmares  . Gabapentin Swelling  . Lyrica [Pregabalin] Other (See Comments)    Alopecia, visual hallucinations, nightmares Altered mental status  . Morphine And Related Other (See Comments)    Unable to void  . Nortripytline Hcl [Nortriptyline] Other (See Comments)    Hair loss and night mares   . Nsaids     REACTION: palpitations, diaphoresis  . Penicillins Nausea And Vomiting and Other (See Comments)  REACTION: upset stomach Has patient had a PCN reaction causing immediate rash, facial/tongue/throat swelling, SOB or lightheadedness with hypotension: No Has patient had a PCN reaction causing severe rash involving mucus membranes or skin necrosis: No Has patient had a PCN reaction that required hospitalization: No Has patient had a PCN reaction occurring within the last 10 years: No If all of the above answers are "NO", then may proceed with Cephalosporin use.  . Tolmetin Other (See Comments)    REACTION: palpitations, diaphoresis  . Wellbutrin [Bupropion]     Constipation, mood swings    Family History: Family History  Problem Relation Age of Onset  . Cancer Mother   . Hypertension Father   . Heart disease Father   . Alcohol abuse Father   . Breast cancer Neg Hx     Social History:  reports that she quit smoking about 24 years ago. Her smoking use included cigarettes. She has a 30.00 pack-year smoking history. She has never used smokeless tobacco. She reports that she does not drink alcohol and does not use drugs.   Physical Exam: BP (!) 158/91   Pulse (!) 116   Ht '5\' 5"'  (1.651 m)   Wt 240 lb (108.9 kg)   BMI 39.94 kg/m   Constitutional:  Well nourished. Alert and oriented, No acute distress. HEENT: [place] AT, mask in place.  Trachea midline Cardiovascular: No clubbing, cyanosis, or edema. Respiratory: Normal respiratory effort, no increased work of  breathing. Neurologic: Grossly intact, no focal deficits, moving all 4 extremities. Psychiatric: Normal mood and affect.   Laboratory Data: Lab Results  Component Value Date   CREATININE 0.98 05/25/2019    Lab Results  Component Value Date   HGBA1C 6.2 (H) 03/06/2017    Urinalysis Component     Latest Ref Rng & Units 07/31/2019  Specific Gravity, UA     1.005 - 1.030 <1.005 (L)  pH, UA     5.0 - 7.5 5.0  Color, UA     Yellow Yellow  Appearance Ur     Clear Clear  Leukocytes,UA     Negative Trace (A)  Protein,UA     Negative/Trace Negative  Glucose, UA     Negative 2+ (A)  Ketones, UA     Negative Negative  RBC, UA     Negative Trace (A)  Bilirubin, UA     Negative Negative  Urobilinogen, Ur     0.2 - 1.0 mg/dL 0.2  Nitrite, UA     Negative Negative  Microscopic Examination      See below:   Component     Latest Ref Rng & Units 07/31/2019          WBC, UA     0 - 5 /hpf 6-10 (A)  RBC     0 - 2 /hpf 0-2  Epithelial Cells (non renal)     0 - 10 /hpf 0-10  Renal Epithel, UA     None seen /hpf 0-10 (A)  Bacteria, UA     None seen/Few Few   I have reviewed the labs.  Pertinent Imaging: CLINICAL DATA:  Bilateral kidney stones  EXAM: ABDOMEN - 1 VIEW  COMPARISON:  06/08/2019  FINDINGS: Scattered large and small bowel gas is noted. Previously seen renal calculi are not well appreciated. Phleboliths are again noted in the pelvis. No definitive ureteral stones are seen.  IMPRESSION: No definitive calculi identified. Previously seen renal stones are not well appreciated on today's exam.   Electronically Signed  By: Inez Catalina M.D.   On: 07/31/2019 16:52 I have independently reviewed the films.  See HPI.   Assessment & Plan:    1. Left ureteral stone KUB demonstrates migration of stone into the mid ureter Continue metabolic expulsion therapy, tamsulosin 0.4 mg sent to pharmacy She will return in 3 weeks with KUB Patient is advised  that if they should start to experience pain that is not able to be controlled with pain medication, intractable nausea and/or vomiting and/or fevers greater than 103 or shaking chills to contact the office immediately or seek treatment in the emergency department for emergent intervention.    2. History of hematuria - high risk Hematuria work up completed in 06/2019 - findings positive for nephrolithiaisis No report of gross hematuria  UA today negative for micro heme RTC in one year for UA - patient to report any gross hematuria in the interim    3. OAB Will address after expulsion or treatment of stone  4. Vaginal discharge Has not seen gynecology  [facility] [address] [place], [place] [address] [phone]  I, Joneen Boers Peace, am acting as a Education administrator for [employer].  I have reviewed the above documentation for accuracy and completeness, and I agree with the above.    Zara Council, PA-C

## 2019-08-02 LAB — URINALYSIS, COMPLETE
Bilirubin, UA: NEGATIVE
Ketones, UA: NEGATIVE
Nitrite, UA: NEGATIVE
Protein,UA: NEGATIVE
Specific Gravity, UA: <1.005 — ABNORMAL LOW (ref 1.005–1.030)
Urobilinogen, Ur: 0.2 mg/dL (ref 0.2–1.0)
pH, UA: 5 (ref 5.0–7.5)

## 2019-08-02 LAB — MICROSCOPIC EXAMINATION

## 2019-08-10 DIAGNOSIS — M542 Cervicalgia: Secondary | ICD-10-CM | POA: Diagnosis not present

## 2019-08-10 DIAGNOSIS — Z87891 Personal history of nicotine dependence: Secondary | ICD-10-CM | POA: Diagnosis not present

## 2019-08-22 NOTE — Progress Notes (Signed)
07/10/19 9:18 AM   Heather Haley 07-01-1960 [id number]  Referring provider: Tracie Harrier, MD [address] [facility] [place],  [place] [address] Chief Complaint  Patient presents with  . Nephrolithiasis    HPI: Heather Haley is a 59 y.o. female with hematuria and nephrolithiasis who presents today for follow up KUB and UA.  History of hematuria (high risk) She is a former smoker. Her CT urogram on 06/08/19 showed a minimally obstructing calculus at the LEFT ureteropelvic junction, LEFT ureter urothelial enhancement distal to the UPJ calculus suggest ureteral inflammation or infection and bilateral nephrolithiasis.  Her cystoscopy with Dr. Bernardo Heater on 06/25/2019 was NED.  Her urine cytology was negative.  She does not report gross hematuria.  Her UA today is positive for 3-10 RBC's   Nephrolithiasis CTU in 05/2019 Small 2 mm nonobstructing RIGHT upper pole renal calculus. 3 mm nonobstructing LEFT renal calculus.  There is a mildly obstructing calculus at the LEFT ureteropelvic junction measuring 3 mm in axial dimension (image 46/2). This calculus is well seen on coronal image 90/5. No hydronephrosis.  KUB 07/31/2019 notes migration of the left UPJ stone into the left ureter at the sacral ala.  KUB today (08/23/2019) notes migration of the left ureteral stone just under the sacrum.   She is currently experiencing frequency, dysuria, urgency and left suprapubic pain.  Her UA is positive for 3-10 RBCs which is consistent with a ureteral stone.  Patient denies any modifying or aggravating factors.  Patient denies any gross hematuria, dysuria or suprapubic/flank pain.  Patient denies any fevers, chills or vomiting.  She has nausea with the discomfort.   PMH: Past Medical History:  Diagnosis Date  . Anxiety   . Asthma   . Chronic kidney disease   . Chronic pain    previously saw Dr. Consuela Mimes in pain clinic, then saw pain specialist in [place]  . Depression    . Diabetes mellitus ([place])   . Frequency of urination   . GERD (gastroesophageal reflux disease)   . Headache(784.0)   . High cholesterol   . Hypertension   . IBS (irritable bowel syndrome)   . Left ankle instability   . Left knee DJD   . Lumbar Degenerative Disc Disease of  10/11/2014  . Neuromuscular disorder ([place])   . Osteoarthritis of hip (Right) 05/05/2015  . Other enthesopathy of ankle and tarsus 12/15/2009   Qualifier: Diagnosis of  By: Oneida Alar MD, KARL    . Parkinson's disease ([place])   . Peripheral sensory neuropathy (Bilateral) 11/19/2014  . Postoperative nausea and vomiting   . Schizophrenia [facility])     Surgical History: Past Surgical History:  Procedure Laterality Date  . ABDOMINAL HYSTERECTOMY    . ANKLE SURGERY    . APPENDECTOMY    . COLONOSCOPY  2013  . COLONOSCOPY WITH PROPOFOL N/A 04/25/2017   Procedure: COLONOSCOPY WITH PROPOFOL;  Surgeon: Manya Silvas, MD;  Location: [facility] ENDOSCOPY;  Service: Endoscopy;  Laterality: N/A;  . ESOPHAGOGASTRODUODENOSCOPY (EGD) WITH PROPOFOL N/A 10/03/2014   Procedure: ESOPHAGOGASTRODUODENOSCOPY (EGD) WITH PROPOFOL;  Surgeon: Josefine Class, MD;  Location: [facility] ENDOSCOPY;  Service: Endoscopy;  Laterality: N/A;  . ESOPHAGOGASTRODUODENOSCOPY (EGD) WITH PROPOFOL  04/25/2017   Procedure: ESOPHAGOGASTRODUODENOSCOPY (EGD) WITH PROPOFOL;  Surgeon: Manya Silvas, MD;  Location: [facility] ENDOSCOPY;  Service: Endoscopy;;  . KNEE ARTHROSCOPY  1997   left knee  . LEFT HEART CATH AND CORONARY ANGIOGRAPHY Left 11/10/2017   Procedure: LEFT HEART CATH AND CORONARY  ANGIOGRAPHY;  Surgeon: Isaias Cowman, MD;  Location: [place] CV LAB;  Service: Cardiovascular;  Laterality: Left;  . LEFT HEART CATHETERIZATION WITH CORONARY ANGIOGRAM N/A 01/11/2013   Procedure: LEFT HEART CATHETERIZATION WITH CORONARY ANGIOGRAM;  Surgeon: Sinclair Grooms, MD;  Location: [facility] CATH LAB;  Service: Cardiovascular;  Laterality: N/A;  . TOTAL KNEE ARTHROPLASTY   08/30/2011   Procedure: TOTAL KNEE ARTHROPLASTY;  Surgeon: Lorn Junes, MD;  Location: [place];  Service: Orthopedics;  Laterality: Left;    Home Medications:  Allergies as of 08/23/2019      Reactions   Prolixin Decanoate [fluphenazine]    Benztropine Other (See Comments)   Hair fall out  Suicide thoughts   Buprenorphine Hcl Other (See Comments)   Unable to void   Carbidopa-levodopa Nausea Only   Codeine Hives   REACTION: hives   Duloxetine Other (See Comments)   Altered mental status Alopecia, visual hallucinations, nightmares   Gabapentin Swelling   Lyrica [pregabalin] Other (See Comments)   Alopecia, visual hallucinations, nightmares Altered mental status   Morphine And Related Other (See Comments)   Unable to void   Nortripytline Hcl [nortriptyline] Other (See Comments)   Hair loss and night mares   Nsaids    REACTION: palpitations, diaphoresis   Penicillins Nausea And Vomiting, Other (See Comments)   REACTION: upset stomach Has patient had a PCN reaction causing immediate rash, facial/tongue/throat swelling, SOB or lightheadedness with hypotension: No Has patient had a PCN reaction causing severe rash involving mucus membranes or skin necrosis: No Has patient had a PCN reaction that required hospitalization: No Has patient had a PCN reaction occurring within the last 10 years: No If all of the above answers are "NO", then may proceed with Cephalosporin use.   Tolmetin Other (See Comments)   REACTION: palpitations, diaphoresis   Wellbutrin [bupropion]    Constipation, mood swings      Medication List       Accurate as of August 23, 2019  9:18 AM. If you have any questions, ask your nurse or doctor.        Accu-Chek Aviva Plus w/Device Kit   Accu-Chek Aviva Soln   acetaminophen 500 MG tablet Commonly known as: TYLENOL Take 500 mg by mouth every 6 (six) hours as needed.   albuterol 108 (90 Base) MCG/ACT inhaler Commonly known as: VENTOLIN HFA Inhale 2 puffs  into the lungs every 6 (six) hours as needed for wheezing or shortness of breath.   amantadine 100 MG capsule Commonly known as: SYMMETREL Take 1 capsule (100 mg total) by mouth 2 (two) times daily.   aspirin 81 MG tablet Take 81 mg by mouth daily.   B-D SINGLE USE SWABS REGULAR Pads   cholecalciferol 1000 units tablet Commonly known as: VITAMIN D Take 1,000 Units by mouth daily.   Colace 100 MG capsule Generic drug: docusate sodium Take 100 mg by mouth 2 (two) times daily.   cyclobenzaprine 10 MG tablet Commonly known as: FLEXERIL Take 10 mg by mouth 2 (two) times daily.   diphenhydrAMINE 25 mg capsule Commonly known as: Benadryl Allergy Take 1 capsule (25 mg total) by mouth daily as needed. For side effects of tremors due to zyprexa   Emgality 120 MG/ML Soaj Generic drug: Galcanezumab-gnlm Inject into the skin.   glimepiride 1 MG tablet Commonly known as: AMARYL Take 1 mg by mouth daily with breakfast.   HYDROcodone-acetaminophen 5-325 MG tablet Commonly known as: NORCO/VICODIN Take 0.5 tablets by mouth every 12 (  twelve) hours as needed for moderate pain.   hydroxypropyl methylcellulose / hypromellose 2.5 % ophthalmic solution Commonly known as: ISOPTO TEARS / GONIOVISC Place 1 drop into both eyes every 4 (four) hours as needed for dry eyes.   losartan 25 MG tablet Commonly known as: COZAAR Take 25 mg by mouth daily.   metFORMIN 500 MG tablet Commonly known as: GLUCOPHAGE Take 1 tablet (500 mg total) by mouth 2 (two) times daily with a meal. For diabetes management   montelukast 10 MG tablet Commonly known as: SINGULAIR Take by mouth.   OLANZapine 5 MG tablet Commonly known as: ZyPREXA Take 0.5 tablets (2.5 mg total) by mouth at bedtime.   omeprazole 40 MG capsule Commonly known as: PRILOSEC Take 40 mg by mouth daily as needed (heartburn).   ondansetron 4 MG tablet Commonly known as: ZOFRAN Take 4 mg by mouth every 8 (eight) hours as needed for  nausea or vomiting.   pravastatin 40 MG tablet Commonly known as: PRAVACHOL Take 40 mg by mouth at bedtime.   rizatriptan 10 MG tablet Commonly known as: MAXALT Take 10 mg by mouth 2 (two) times daily. May repeat in 2 hours if needed   rOPINIRole 0.25 MG tablet Commonly known as: REQUIP Take 0.75 mg by mouth 3 (three) times daily.   senna 8.6 MG tablet Commonly known as: SENOKOT Take 1-4 tablets (8.6-34.4 mg total) by mouth daily as needed for constipation.   sertraline 100 MG tablet Commonly known as: ZOLOFT Take 1.5 tablets (150 mg total) by mouth daily.   tamsulosin 0.4 MG Caps capsule Commonly known as: FLOMAX TAKE 1 CAPSULE(0.4 MG) BY MOUTH DAILY   topiramate 100 MG tablet Commonly known as: TOPAMAX Take 0.5 tablets (50 mg total) by mouth at bedtime. For mood control   traMADol 50 MG tablet Commonly known as: ULTRAM Take 2 tablets (100 mg total) by mouth 2 (two) times daily for 15 days.   verapamil 120 MG CR tablet Commonly known as: CALAN-SR Take 1 tablet (120 mg total) by mouth at bedtime. For high blood pressure   vitamin B-12 1000 MCG tablet Commonly known as: CYANOCOBALAMIN Take 1,000 mcg by mouth daily.       Allergies:  Allergies  Allergen Reactions  . Prolixin Decanoate [Fluphenazine]   . Benztropine Other (See Comments)    Hair fall out  Suicide thoughts  . Buprenorphine Hcl Other (See Comments)    Unable to void  . Carbidopa-Levodopa Nausea Only  . Codeine Hives    REACTION: hives  . Duloxetine Other (See Comments)    Altered mental status Alopecia, visual hallucinations, nightmares  . Gabapentin Swelling  . Lyrica [Pregabalin] Other (See Comments)    Alopecia, visual hallucinations, nightmares Altered mental status  . Morphine And Related Other (See Comments)    Unable to void  . Nortripytline Hcl [Nortriptyline] Other (See Comments)    Hair loss and night mares   . Nsaids     REACTION: palpitations, diaphoresis  . Penicillins  Nausea And Vomiting and Other (See Comments)    REACTION: upset stomach Has patient had a PCN reaction causing immediate rash, facial/tongue/throat swelling, SOB or lightheadedness with hypotension: No Has patient had a PCN reaction causing severe rash involving mucus membranes or skin necrosis: No Has patient had a PCN reaction that required hospitalization: No Has patient had a PCN reaction occurring within the last 10 years: No If all of the above answers are "NO", then may proceed with Cephalosporin use.  Marland Kitchen  Tolmetin Other (See Comments)    REACTION: palpitations, diaphoresis  . Wellbutrin [Bupropion]     Constipation, mood swings    Family History: Family History  Problem Relation Age of Onset  . Cancer Mother   . Hypertension Father   . Heart disease Father   . Alcohol abuse Father   . Breast cancer Neg Hx     Social History:  reports that she quit smoking about 24 years ago. Her smoking use included cigarettes. She has a 30.00 pack-year smoking history. She has never used smokeless tobacco. She reports that she does not drink alcohol and does not use drugs.   Physical Exam: BP 130/79   Pulse (!) 120   Ht '5\' 5"'  (1.651 m)   Wt 230 lb (104.3 kg)   BMI 38.27 kg/m   Constitutional:  Well nourished. Alert and oriented, No acute distress. HEENT: [place] AT, mask in place.  Trachea midline Cardiovascular: No clubbing, cyanosis, or edema. Respiratory: Normal respiratory effort, no increased work of breathing. Neurologic: Grossly intact, no focal deficits, moving all 4 extremities. Psychiatric: Normal mood and affect.   Laboratory Data: Lab Results  Component Value Date   CREATININE 0.98 05/25/2019    Lab Results  Component Value Date   HGBA1C 6.2 (H) 03/06/2017    Urinalysis Component     Latest Ref Rng & Units 08/23/2019  Specific Gravity, UA     1.005 - 1.030 >1.030 (H)  pH, UA     5.0 - 7.5 5.0  Color, UA     Yellow Yellow  Appearance Ur     Clear Cloudy (A)   Leukocytes,UA     Negative Negative  Protein,UA     Negative/Trace 2+ (A)  Glucose, UA     Negative 3+ (A)  Ketones, UA     Negative Trace (A)  RBC, UA     Negative 3+ (A)  Bilirubin, UA     Negative Negative  Urobilinogen, Ur     0.2 - 1.0 mg/dL 0.2  Nitrite, UA     Negative Negative  Microscopic Examination      See below:   Component     Latest Ref Rng & Units 08/23/2019          WBC, UA     0 - 5 /hpf 0-5  RBC     0 - 2 /hpf 3-10 (A)  Epithelial Cells (non renal)     0 - 10 /hpf 0-10  Renal Epithel, UA     None seen /hpf 0-10 (A)  Casts     None seen /lpf Present (A)  Cast Type     N/A Granular casts (A)  Bacteria, UA     None seen/Few Few   I have reviewed the labs.  Pertinent Imaging: CLINICAL DATA:  Kidney stone  EXAM: ABDOMEN - 1 VIEW  COMPARISON:  07/31/2019  FINDINGS: As before, renal calculi identified on CT are not visualized. There is a new 4 mm calcification overlying the left pelvis, which may be along the course of the left ureter. Bowel gas pattern is unremarkable. No acute osseous abnormality.  IMPRESSION: Possible 4 mm calculus of the distal left ureter.   Electronically Signed   By: Macy Mis M.D.   On: 08/23/2019 16:01  I have independently reviewed the films.  See HPI.   Assessment & Plan:    1. Left ureteral stone KUB demonstrates migration of stone into the distal ureter Continue metabolic expulsion therapy, tamsulosin  0.4 mg  She will return in 1 weeks with KUB Patient is advised that if they should start to experience pain that is not able to be controlled with pain medication, intractable nausea and/or vomiting and/or fevers greater than 103 or shaking chills to contact the office immediately or seek treatment in the emergency department for emergent intervention.    2. History of hematuria - high risk Hematuria work up completed in 06/2019 - findings positive for nephrolithiaisis No report of gross  hematuria  UA today positive for micro heme, but likely to stone RTC in one year for UA - patient to report any gross hematuria in the interim    3. OAB Will address after expulsion or treatment of stone   [place] [address] [place], [place] [address] [phone]

## 2019-08-23 ENCOUNTER — Ambulatory Visit: Payer: Medicare HMO | Admitting: Urology

## 2019-08-23 ENCOUNTER — Other Ambulatory Visit: Payer: Self-pay

## 2019-08-23 ENCOUNTER — Ambulatory Visit
Admission: RE | Admit: 2019-08-23 | Discharge: 2019-08-23 | Disposition: A | Discharge status: Home / Self Care | Payer: Medicare HMO | Attending: Urology | Admitting: Urology

## 2019-08-23 ENCOUNTER — Ambulatory Visit
Admission: RE | Admit: 2019-08-23 | Discharge: 2019-08-23 | Disposition: A | Discharge status: Home / Self Care | Payer: Medicare HMO | Source: Ambulatory Visit | Attending: Urology | Admitting: Urology

## 2019-08-23 ENCOUNTER — Encounter: Payer: Self-pay | Admitting: Urology

## 2019-08-23 VITALS — BP 130/79 | HR 120 | Ht 65.0 in | Wt 230.0 lb

## 2019-08-23 DIAGNOSIS — N200 Calculus of kidney: Secondary | ICD-10-CM | POA: Diagnosis not present

## 2019-08-23 DIAGNOSIS — T50905A Adverse effect of unspecified drugs, medicaments and biological substances, initial encounter: Secondary | ICD-10-CM | POA: Diagnosis not present

## 2019-08-23 DIAGNOSIS — R0789 Other chest pain: Secondary | ICD-10-CM | POA: Diagnosis not present

## 2019-08-23 DIAGNOSIS — Z6841 Body Mass Index (BMI) 40.0 and over, adult: Secondary | ICD-10-CM | POA: Diagnosis not present

## 2019-08-23 DIAGNOSIS — N898 Other specified noninflammatory disorders of vagina: Secondary | ICD-10-CM

## 2019-08-23 DIAGNOSIS — J4540 Moderate persistent asthma, uncomplicated: Secondary | ICD-10-CM | POA: Diagnosis not present

## 2019-08-23 DIAGNOSIS — N3281 Overactive bladder: Secondary | ICD-10-CM | POA: Diagnosis not present

## 2019-08-23 DIAGNOSIS — Z87448 Personal history of other diseases of urinary system: Secondary | ICD-10-CM | POA: Diagnosis not present

## 2019-08-23 DIAGNOSIS — R635 Abnormal weight gain: Secondary | ICD-10-CM | POA: Diagnosis not present

## 2019-08-23 DIAGNOSIS — R0600 Dyspnea, unspecified: Secondary | ICD-10-CM | POA: Diagnosis not present

## 2019-08-23 DIAGNOSIS — E538 Deficiency of other specified B group vitamins: Secondary | ICD-10-CM | POA: Diagnosis not present

## 2019-08-23 DIAGNOSIS — M62838 Other muscle spasm: Secondary | ICD-10-CM | POA: Diagnosis not present

## 2019-08-23 DIAGNOSIS — N2889 Other specified disorders of kidney and ureter: Secondary | ICD-10-CM | POA: Diagnosis not present

## 2019-08-24 LAB — MICROSCOPIC EXAMINATION

## 2019-08-24 LAB — URINALYSIS, COMPLETE
Bilirubin, UA: NEGATIVE
Leukocytes,UA: NEGATIVE
Nitrite, UA: NEGATIVE
Specific Gravity, UA: >1.03 — ABNORMAL HIGH (ref 1.005–1.030)
Urobilinogen, Ur: 0.2 mg/dL (ref 0.2–1.0)
pH, UA: 5 (ref 5.0–7.5)

## 2019-08-27 LAB — CULTURE, URINE COMPREHENSIVE

## 2019-08-29 DIAGNOSIS — R609 Edema, unspecified: Secondary | ICD-10-CM | POA: Diagnosis not present

## 2019-08-29 DIAGNOSIS — R002 Palpitations: Secondary | ICD-10-CM | POA: Diagnosis not present

## 2019-08-29 DIAGNOSIS — R0600 Dyspnea, unspecified: Secondary | ICD-10-CM | POA: Diagnosis not present

## 2019-08-29 DIAGNOSIS — I10 Essential (primary) hypertension: Secondary | ICD-10-CM | POA: Diagnosis not present

## 2019-08-29 NOTE — H&P (View-Only) (Signed)
07/10/19 8:14 AM   Heather Haley 12-Feb-1960 [id number]  Referring provider: Tracie Harrier, MD [address] [facility] [place],  [place] [address] Chief Complaint  Patient presents with  . Nephrolithiasis    HPI: Heather Haley is a 59 y.o. female with hematuria and nephrolithiasis who presents today for follow up KUB and UA.  History of hematuria (high risk) She is a former smoker. Her CT urogram on 06/08/19 showed a minimally obstructing calculus at the LEFT ureteropelvic junction, LEFT ureter urothelial enhancement distal to the UPJ calculus suggest ureteral inflammation or infection and bilateral nephrolithiasis.  Her cystoscopy with Dr. Bernardo Heater on 06/25/2019 was NED.  Her urine cytology was negative.  She does not report gross hematuria.  Her UA negative for micro heme.   Nephrolithiasis CTU in 05/2019 Small 2 mm nonobstructing RIGHT upper pole renal calculus. 3 mm nonobstructing LEFT renal calculus.  There is a mildly obstructing calculus at the LEFT ureteropelvic junction measuring 3 mm in axial dimension (image 46/2). This calculus is well seen on coronal image 90/5. No hydronephrosis.  KUB 07/31/2019 notes migration of the left UPJ stone into the left ureter at the sacral ala.  KUB 08/23/2019 notes migration of the left ureteral stone just under the sacrum.   KUB today stone remains unchanged in the distal ureter.    She continues to have urgency, dysuria, urge incontinence, left lower quadrant pain radiating to the left flank.  Patient denies any modifying or aggravating factors.  Patient denies any gross hematuria.  Patient denies any fevers, chills, nausea or vomiting.  Her UA today is positive for 3+ glucose, 6-10 WBCs, greater than 10 squames and few bacteria.   PMH: Past Medical History:  Diagnosis Date  . Anxiety   . Asthma   . Chronic kidney disease   . Chronic pain    previously saw Dr. Consuela Mimes in pain clinic, then saw pain specialist in  [place]  . Depression   . Diabetes mellitus ([place])   . Frequency of urination   . GERD (gastroesophageal reflux disease)   . Headache(784.0)   . High cholesterol   . Hypertension   . IBS (irritable bowel syndrome)   . Left ankle instability   . Left knee DJD   . Lumbar Degenerative Disc Disease of  10/11/2014  . Neuromuscular disorder ([place])   . Osteoarthritis of hip (Right) 05/05/2015  . Other enthesopathy of ankle and tarsus 12/15/2009   Qualifier: Diagnosis of  By: Oneida Alar MD, KARL    . Parkinson's disease ([place])   . Peripheral sensory neuropathy (Bilateral) 11/19/2014  . Postoperative nausea and vomiting   . Schizophrenia [facility])     Surgical History: Past Surgical History:  Procedure Laterality Date  . ABDOMINAL HYSTERECTOMY    . ANKLE SURGERY    . APPENDECTOMY    . COLONOSCOPY  2013  . COLONOSCOPY WITH PROPOFOL N/A 04/25/2017   Procedure: COLONOSCOPY WITH PROPOFOL;  Surgeon: Manya Silvas, MD;  Location: [facility] ENDOSCOPY;  Service: Endoscopy;  Laterality: N/A;  . ESOPHAGOGASTRODUODENOSCOPY (EGD) WITH PROPOFOL N/A 10/03/2014   Procedure: ESOPHAGOGASTRODUODENOSCOPY (EGD) WITH PROPOFOL;  Surgeon: Josefine Class, MD;  Location: [facility] ENDOSCOPY;  Service: Endoscopy;  Laterality: N/A;  . ESOPHAGOGASTRODUODENOSCOPY (EGD) WITH PROPOFOL  04/25/2017   Procedure: ESOPHAGOGASTRODUODENOSCOPY (EGD) WITH PROPOFOL;  Surgeon: Manya Silvas, MD;  Location: [facility] ENDOSCOPY;  Service: Endoscopy;;  . KNEE ARTHROSCOPY  1997   left knee  . LEFT HEART CATH AND CORONARY ANGIOGRAPHY Left 11/10/2017  Procedure: LEFT HEART CATH AND CORONARY ANGIOGRAPHY;  Surgeon: Paraschos, Alexander, MD;  Location: [facility] INVASIVE CV LAB;  Service: Cardiovascular;  Laterality: Left;  . LEFT HEART CATHETERIZATION WITH CORONARY ANGIOGRAM N/A 01/11/2013   Procedure: LEFT HEART CATHETERIZATION WITH CORONARY ANGIOGRAM;  Surgeon: Henry W Smith III, MD;  Location: [facility] CATH LAB;  Service: Cardiovascular;  Laterality: N/A;  .  TOTAL KNEE ARTHROPLASTY  08/30/2011   Procedure: TOTAL KNEE ARTHROPLASTY;  Surgeon: Robert A Wainer, MD;  Location: [facility] OR;  Service: Orthopedics;  Laterality: Left;    Home Medications:  Allergies as of 08/30/2019      Reactions   Prolixin Decanoate [fluphenazine]    Benztropine Other (See Comments)   Hair fall out  Suicide thoughts   Buprenorphine    Buprenorphine Hcl Other (See Comments)   Unable to void   Carbidopa-levodopa Nausea Only   Codeine Hives   REACTION: hives   Cymbalta [duloxetine Hcl] Other (See Comments)   Altered mental status   Duloxetine Other (See Comments)   Altered mental status Alopecia, visual hallucinations, nightmares   Gabapentin Swelling   Lyrica [pregabalin] Other (See Comments)   Alopecia, visual hallucinations, nightmares Altered mental status   Morphine    Morphine And Related Other (See Comments)   Unable to void   Nortripytline Hcl [nortriptyline] Other (See Comments)   Hair loss and night mares   Nsaids    REACTION: palpitations, diaphoresis   Penicillins Nausea And Vomiting, Other (See Comments)   REACTION: upset stomach Has patient had a PCN reaction causing immediate rash, facial/tongue/throat swelling, SOB or lightheadedness with hypotension: No Has patient had a PCN reaction causing severe rash involving mucus membranes or skin necrosis: No Has patient had a PCN reaction that required hospitalization: No Has patient had a PCN reaction occurring within the last 10 years: No If all of the above answers are "NO", then may proceed with Cephalosporin use.   Tolmetin Other (See Comments)   REACTION: palpitations, diaphoresis   Wellbutrin [bupropion]    Constipation, mood swings      Medication List       Accurate as of August 30, 2019 11:59 PM. If you have any questions, ask your nurse or doctor.        Accu-Chek Aviva Plus w/Device Kit   Accu-Chek Aviva Soln   acetaminophen 500 MG tablet Commonly known as: TYLENOL Take 500 mg  by mouth every 6 (six) hours as needed.   albuterol 108 (90 Base) MCG/ACT inhaler Commonly known as: VENTOLIN HFA Inhale 2 puffs into the lungs every 6 (six) hours as needed for wheezing or shortness of breath.   amantadine 100 MG capsule Commonly known as: SYMMETREL Take 1 capsule (100 mg total) by mouth 2 (two) times daily.   ARIPiprazole 10 MG tablet Commonly known as: ABILIFY aripiprazole 10 mg tablet   aspirin 81 MG tablet Take 81 mg by mouth daily.   B-D SINGLE USE SWABS REGULAR Pads   benztropine 1 MG tablet Commonly known as: COGENTIN benztropine 1 mg tablet   cholecalciferol 1000 units tablet Commonly known as: VITAMIN D Take 1,000 Units by mouth daily.   Colace 100 MG capsule Generic drug: docusate sodium Take 100 mg by mouth 2 (two) times daily.   cyclobenzaprine 10 MG tablet Commonly known as: FLEXERIL Take 10 mg by mouth 2 (two) times daily.   diphenhydrAMINE 25 mg capsule Commonly known as: Benadryl Allergy Take 1 capsule (25 mg total) by mouth daily as needed. For side   effects of tremors due to zyprexa   Emgality 120 MG/ML Soaj Generic drug: Galcanezumab-gnlm Inject into the skin.   furosemide 20 MG tablet Commonly known as: LASIX furosemide 20 mg tablet   glimepiride 1 MG tablet Commonly known as: AMARYL Take 1 mg by mouth daily with breakfast.   HYDROcodone-acetaminophen 5-325 MG tablet Commonly known as: NORCO/VICODIN Take 0.5 tablets by mouth every 12 (twelve) hours as needed for moderate pain.   hydroxypropyl methylcellulose / hypromellose 2.5 % ophthalmic solution Commonly known as: ISOPTO TEARS / GONIOVISC Place 1 drop into both eyes every 4 (four) hours as needed for dry eyes.   losartan 25 MG tablet Commonly known as: COZAAR Take 25 mg by mouth daily.   metFORMIN 500 MG tablet Commonly known as: GLUCOPHAGE Take 1 tablet (500 mg total) by mouth 2 (two) times daily with a meal. For diabetes management   mirtazapine 7.5 MG  tablet Commonly known as: REMERON mirtazapine 7.5 mg tablet   montelukast 10 MG tablet Commonly known as: SINGULAIR Take by mouth.   OLANZapine 5 MG tablet Commonly known as: ZyPREXA Take 0.5 tablets (2.5 mg total) by mouth at bedtime.   omeprazole 40 MG capsule Commonly known as: PRILOSEC Take 40 mg by mouth daily as needed (heartburn).   ondansetron 4 MG tablet Commonly known as: ZOFRAN Take 4 mg by mouth every 8 (eight) hours as needed for nausea or vomiting.   pantoprazole 40 MG tablet Commonly known as: PROTONIX pantoprazole 40 mg tablet,delayed release   pravastatin 40 MG tablet Commonly known as: PRAVACHOL Take 40 mg by mouth at bedtime.   ranitidine 150 MG tablet Commonly known as: ZANTAC ranitidine 150 mg tablet  Take 1 tablet twice a day by oral route.   risperiDONE 0.5 MG tablet Commonly known as: RISPERDAL risperidone 0.5 mg tablet   rizatriptan 10 MG tablet Commonly known as: MAXALT Take 10 mg by mouth 2 (two) times daily. May repeat in 2 hours if needed   rOPINIRole 0.25 MG tablet Commonly known as: REQUIP Take 0.75 mg by mouth 3 (three) times daily.   senna 8.6 MG tablet Commonly known as: SENOKOT Take 1-4 tablets (8.6-34.4 mg total) by mouth daily as needed for constipation.   sertraline 100 MG tablet Commonly known as: ZOLOFT Take 1.5 tablets (150 mg total) by mouth daily.   tamsulosin 0.4 MG Caps capsule Commonly known as: FLOMAX TAKE 1 CAPSULE(0.4 MG) BY MOUTH DAILY   topiramate 100 MG tablet Commonly known as: TOPAMAX Take 0.5 tablets (50 mg total) by mouth at bedtime. For mood control   torsemide 20 MG tablet Commonly known as: DEMADEX   traMADol 50 MG tablet Commonly known as: ULTRAM Take 2 tablets (100 mg total) by mouth 2 (two) times daily for 15 days.   verapamil 120 MG CR tablet Commonly known as: CALAN-SR Take 1 tablet (120 mg total) by mouth at bedtime. For high blood pressure   vitamin B-12 1000 MCG tablet Commonly  known as: CYANOCOBALAMIN Take 1,000 mcg by mouth daily.       Allergies:  Allergies  Allergen Reactions  . Prolixin Decanoate [Fluphenazine]   . Benztropine Other (See Comments)    Hair fall out  Suicide thoughts  . Buprenorphine   . Buprenorphine Hcl Other (See Comments)    Unable to void  . Carbidopa-Levodopa Nausea Only  . Codeine Hives    REACTION: hives  . Cymbalta [Duloxetine Hcl] Other (See Comments)    Altered mental status  . Duloxetine   Other (See Comments)    Altered mental status Alopecia, visual hallucinations, nightmares  . Gabapentin Swelling  . Lyrica [Pregabalin] Other (See Comments)    Alopecia, visual hallucinations, nightmares Altered mental status  . Morphine   . Morphine And Related Other (See Comments)    Unable to void  . Nortripytline Hcl [Nortriptyline] Other (See Comments)    Hair loss and night mares   . Nsaids     REACTION: palpitations, diaphoresis  . Penicillins Nausea And Vomiting and Other (See Comments)    REACTION: upset stomach Has patient had a PCN reaction causing immediate rash, facial/tongue/throat swelling, SOB or lightheadedness with hypotension: No Has patient had a PCN reaction causing severe rash involving mucus membranes or skin necrosis: No Has patient had a PCN reaction that required hospitalization: No Has patient had a PCN reaction occurring within the last 10 years: No If all of the above answers are "NO", then may proceed with Cephalosporin use.  . Tolmetin Other (See Comments)    REACTION: palpitations, diaphoresis  . Wellbutrin [Bupropion]     Constipation, mood swings    Family History: Family History  Problem Relation Age of Onset  . Cancer Mother   . Hypertension Father   . Heart disease Father   . Alcohol abuse Father   . Breast cancer Neg Hx     Social History:  reports that she quit smoking about 24 years ago. Her smoking use included cigarettes. She has a 30.00 pack-year smoking history. She has  never used smokeless tobacco. She reports that she does not drink alcohol and does not use drugs.   Physical Exam: BP (!) 141/87   Pulse (!) 111   Ht 5' 5" (1.651 m)   Wt (!) 235 lb (106.6 kg)   BMI 39.11 kg/m   Constitutional:  Well nourished. Alert and oriented, No acute distress. HEENT: [place] AT, mask in place  Trachea midline Cardiovascular: No clubbing, cyanosis, or edema. Respiratory: Normal respiratory effort, no increased work of breathing. Neurologic: Grossly intact, no focal deficits, moving all 4 extremities. Psychiatric: Normal mood and affect.   Laboratory Data: Lab Results  Component Value Date   CREATININE 0.89 08/30/2019    Lab Results  Component Value Date   HGBA1C 6.2 (H) 03/06/2017    Urinalysis Component     Latest Ref Rng & Units 08/30/2019  Specific Gravity, UA     1.005 - 1.030 1.010  pH, UA     5.0 - 7.5 5.0  Color, UA     Yellow Yellow  Appearance Ur     Clear Clear  Leukocytes,UA     Negative Negative  Protein,UA     Negative/Trace Negative  Glucose, UA     Negative 3+ (A)  Ketones, UA     Negative Trace (A)  RBC, UA     Negative Trace (A)  Bilirubin, UA     Negative Negative  Urobilinogen, Ur     0.2 - 1.0 mg/dL 0.2  Nitrite, UA     Negative Negative  Microscopic Examination      See below:   Component     Latest Ref Rng & Units 08/30/2019         2:45 PM  WBC, UA     0 - 5 /hpf 6-10 (A)  RBC     3.77 - 5.28 x10E6/uL None seen  Epithelial Cells (non renal)     0 - 10 /hpf >10 (H)  Renal Epithel, UA       None seen /hpf 0-10 (A)  Mucus, UA     Not Estab. Present (A)  Bacteria, UA     None seen/Few Few (A)  Yeast, UA     None seen Present (A)   I have reviewed the labs.  Pertinent Imaging: CLINICAL DATA:  Nephrolithiasis  EXAM: ABDOMEN - 1 VIEW  COMPARISON:  08/23/2019  FINDINGS: Previously noted distal left ureteral calculus has migrated slightly distally, but remains within the expected distal ureter.  Calculus measures roughly 4 mm. No additional nephro or urolithiasis. Phleboliths noted within the pelvis. Normal abdominal gas pattern.  IMPRESSION: Slight interval antegrade progression of left ureteral calculus.   Electronically Signed   By: Ashesh  Parikh MD   On: 08/31/2019 21:27 I have independently reviewed the films.  See HPI.   Assessment & Plan:    1. Left ureteral stone KUB demonstrates that stone remains in the distal ureter Patient is not wanting to pursue definitive therapy at this time, I explained that URS would be the first lined therapy if stone(s) do not pass, but ESWL is the procedure with the least morbidity and lowest complication rate, but URS has a greater stone-free rate in a single procedure - after discussion regarding each treatment modality - she would like to have left URS/LL/ureteral stent placement schedule left ureteroscopy with laser lithotripsy and ureteral stent placement - explained to the patient how the procedure is performed and the risks involved - informed patient that they will have a stent placed during the procedure and will remain in place after the procedure for a short time.  - stent may be removed in the office with a cystoscope or patient may be instructed to remove the stent themselves by the string - described "stent pain" as feelings of needing to urinate/overactive bladder and a warm, tingling sensation to intense pain in the affected flank - residual stones within the kidney or ureter may be present after the procedure and may need to have these addressed at a different encounter - injury to the ureter is the most common intra-operative risk, it may result in an open procedure to correct the defect - infection and bleeding are also risks - explained the risks of general anesthesia, such as: MI, CVA, paralysis, coma and/or death. Continue metabolic expulsion therapy in the meantime, tamsulosin 0.4 mg  Patient is advised that if they  should start to experience pain that is not able to be controlled with pain medication, intractable nausea and/or vomiting and/or fevers greater than 103 or shaking chills to contact the office immediately or seek treatment in the emergency department for emergent intervention.    2. History of hematuria - high risk Hematuria work up completed in 06/2019 - findings positive for nephrolithiaisis No report of gross hematuria  UA today negative for micro heme RTC in one year for UA - patient to report any gross hematuria in the interim    3. OAB Will address after expulsion or treatment of stone   [facility] [address], [address] [place], [place] [address] [phone]   

## 2019-08-29 NOTE — Progress Notes (Signed)
07/10/19 8:14 AM   Heather Haley 01/24/1961 [id number]  Referring provider: Tracie Harrier, MD [address] [facility] [place],  [place] [address] Chief Complaint  Patient presents with  . Nephrolithiasis    HPI: Heather Haley is a 59 y.o. female with hematuria and nephrolithiasis who presents today for follow up KUB and UA.  History of hematuria (high risk) She is a former smoker. Her CT urogram on 06/08/19 showed a minimally obstructing calculus at the LEFT ureteropelvic junction, LEFT ureter urothelial enhancement distal to the UPJ calculus suggest ureteral inflammation or infection and bilateral nephrolithiasis.  Her cystoscopy with Dr. Bernardo Heater on 06/25/2019 was NED.  Her urine cytology was negative.  She does not report gross hematuria.  Her UA negative for micro heme.   Nephrolithiasis CTU in 05/2019 Small 2 mm nonobstructing RIGHT upper pole renal calculus. 3 mm nonobstructing LEFT renal calculus.  There is a mildly obstructing calculus at the LEFT ureteropelvic junction measuring 3 mm in axial dimension (image 46/2). This calculus is well seen on coronal image 90/5. No hydronephrosis.  KUB 07/31/2019 notes migration of the left UPJ stone into the left ureter at the sacral ala.  KUB 08/23/2019 notes migration of the left ureteral stone just under the sacrum.   KUB today stone remains unchanged in the distal ureter.    She continues to have urgency, dysuria, urge incontinence, left lower quadrant pain radiating to the left flank.  Patient denies any modifying or aggravating factors.  Patient denies any gross hematuria.  Patient denies any fevers, chills, nausea or vomiting.  Her UA today is positive for 3+ glucose, 6-10 WBCs, greater than 10 squames and few bacteria.   PMH: Past Medical History:  Diagnosis Date  . Anxiety   . Asthma   . Chronic kidney disease   . Chronic pain    previously saw Dr. Consuela Mimes in pain clinic, then saw pain specialist in  [place]  . Depression   . Diabetes mellitus ([place])   . Frequency of urination   . GERD (gastroesophageal reflux disease)   . Headache(784.0)   . High cholesterol   . Hypertension   . IBS (irritable bowel syndrome)   . Left ankle instability   . Left knee DJD   . Lumbar Degenerative Disc Disease of  10/11/2014  . Neuromuscular disorder ([place])   . Osteoarthritis of hip (Right) 05/05/2015  . Other enthesopathy of ankle and tarsus 12/15/2009   Qualifier: Diagnosis of  By: Oneida Alar MD, KARL    . Parkinson's disease ([place])   . Peripheral sensory neuropathy (Bilateral) 11/19/2014  . Postoperative nausea and vomiting   . Schizophrenia [facility])     Surgical History: Past Surgical History:  Procedure Laterality Date  . ABDOMINAL HYSTERECTOMY    . ANKLE SURGERY    . APPENDECTOMY    . COLONOSCOPY  2013  . COLONOSCOPY WITH PROPOFOL N/A 04/25/2017   Procedure: COLONOSCOPY WITH PROPOFOL;  Surgeon: Manya Silvas, MD;  Location: [facility] ENDOSCOPY;  Service: Endoscopy;  Laterality: N/A;  . ESOPHAGOGASTRODUODENOSCOPY (EGD) WITH PROPOFOL N/A 10/03/2014   Procedure: ESOPHAGOGASTRODUODENOSCOPY (EGD) WITH PROPOFOL;  Surgeon: Josefine Class, MD;  Location: [facility] ENDOSCOPY;  Service: Endoscopy;  Laterality: N/A;  . ESOPHAGOGASTRODUODENOSCOPY (EGD) WITH PROPOFOL  04/25/2017   Procedure: ESOPHAGOGASTRODUODENOSCOPY (EGD) WITH PROPOFOL;  Surgeon: Manya Silvas, MD;  Location: [facility] ENDOSCOPY;  Service: Endoscopy;;  . KNEE ARTHROSCOPY  1997   left knee  . LEFT HEART CATH AND CORONARY ANGIOGRAPHY Left 11/10/2017  Procedure: LEFT HEART CATH AND CORONARY ANGIOGRAPHY;  Surgeon: Isaias Cowman, MD;  Location: [place] CV LAB;  Service: Cardiovascular;  Laterality: Left;  . LEFT HEART CATHETERIZATION WITH CORONARY ANGIOGRAM N/A 01/11/2013   Procedure: LEFT HEART CATHETERIZATION WITH CORONARY ANGIOGRAM;  Surgeon: Sinclair Grooms, MD;  Location: [facility] CATH LAB;  Service: Cardiovascular;  Laterality: N/A;  .  TOTAL KNEE ARTHROPLASTY  08/30/2011   Procedure: TOTAL KNEE ARTHROPLASTY;  Surgeon: Lorn Junes, MD;  Location: [facility];  Service: Orthopedics;  Laterality: Left;    Home Medications:  Allergies as of 08/30/2019      Reactions   Prolixin Decanoate [fluphenazine]    Benztropine Other (See Comments)   Hair fall out  Suicide thoughts   Buprenorphine    Buprenorphine Hcl Other (See Comments)   Unable to void   Carbidopa-levodopa Nausea Only   Codeine Hives   REACTION: hives   Cymbalta [duloxetine Hcl] Other (See Comments)   Altered mental status   Duloxetine Other (See Comments)   Altered mental status Alopecia, visual hallucinations, nightmares   Gabapentin Swelling   Lyrica [pregabalin] Other (See Comments)   Alopecia, visual hallucinations, nightmares Altered mental status   Morphine    Morphine And Related Other (See Comments)   Unable to void   Nortripytline Hcl [nortriptyline] Other (See Comments)   Hair loss and night mares   Nsaids    REACTION: palpitations, diaphoresis   Penicillins Nausea And Vomiting, Other (See Comments)   REACTION: upset stomach Has patient had a PCN reaction causing immediate rash, facial/tongue/throat swelling, SOB or lightheadedness with hypotension: No Has patient had a PCN reaction causing severe rash involving mucus membranes or skin necrosis: No Has patient had a PCN reaction that required hospitalization: No Has patient had a PCN reaction occurring within the last 10 years: No If all of the above answers are "NO", then may proceed with Cephalosporin use.   Tolmetin Other (See Comments)   REACTION: palpitations, diaphoresis   Wellbutrin [bupropion]    Constipation, mood swings      Medication List       Accurate as of August 30, 2019 11:59 PM. If you have any questions, ask your nurse or doctor.        Accu-Chek Aviva Plus w/Device Kit   Accu-Chek Aviva Soln   acetaminophen 500 MG tablet Commonly known as: TYLENOL Take 500 mg  by mouth every 6 (six) hours as needed.   albuterol 108 (90 Base) MCG/ACT inhaler Commonly known as: VENTOLIN HFA Inhale 2 puffs into the lungs every 6 (six) hours as needed for wheezing or shortness of breath.   amantadine 100 MG capsule Commonly known as: SYMMETREL Take 1 capsule (100 mg total) by mouth 2 (two) times daily.   ARIPiprazole 10 MG tablet Commonly known as: ABILIFY aripiprazole 10 mg tablet   aspirin 81 MG tablet Take 81 mg by mouth daily.   B-D SINGLE USE SWABS REGULAR Pads   benztropine 1 MG tablet Commonly known as: COGENTIN benztropine 1 mg tablet   cholecalciferol 1000 units tablet Commonly known as: VITAMIN D Take 1,000 Units by mouth daily.   Colace 100 MG capsule Generic drug: docusate sodium Take 100 mg by mouth 2 (two) times daily.   cyclobenzaprine 10 MG tablet Commonly known as: FLEXERIL Take 10 mg by mouth 2 (two) times daily.   diphenhydrAMINE 25 mg capsule Commonly known as: Benadryl Allergy Take 1 capsule (25 mg total) by mouth daily as needed. For side  effects of tremors due to zyprexa   Emgality 120 MG/ML Soaj Generic drug: Galcanezumab-gnlm Inject into the skin.   furosemide 20 MG tablet Commonly known as: LASIX furosemide 20 mg tablet   glimepiride 1 MG tablet Commonly known as: AMARYL Take 1 mg by mouth daily with breakfast.   HYDROcodone-acetaminophen 5-325 MG tablet Commonly known as: NORCO/VICODIN Take 0.5 tablets by mouth every 12 (twelve) hours as needed for moderate pain.   hydroxypropyl methylcellulose / hypromellose 2.5 % ophthalmic solution Commonly known as: ISOPTO TEARS / GONIOVISC Place 1 drop into both eyes every 4 (four) hours as needed for dry eyes.   losartan 25 MG tablet Commonly known as: COZAAR Take 25 mg by mouth daily.   metFORMIN 500 MG tablet Commonly known as: GLUCOPHAGE Take 1 tablet (500 mg total) by mouth 2 (two) times daily with a meal. For diabetes management   mirtazapine 7.5 MG  tablet Commonly known as: REMERON mirtazapine 7.5 mg tablet   montelukast 10 MG tablet Commonly known as: SINGULAIR Take by mouth.   OLANZapine 5 MG tablet Commonly known as: ZyPREXA Take 0.5 tablets (2.5 mg total) by mouth at bedtime.   omeprazole 40 MG capsule Commonly known as: PRILOSEC Take 40 mg by mouth daily as needed (heartburn).   ondansetron 4 MG tablet Commonly known as: ZOFRAN Take 4 mg by mouth every 8 (eight) hours as needed for nausea or vomiting.   pantoprazole 40 MG tablet Commonly known as: PROTONIX pantoprazole 40 mg tablet,delayed release   pravastatin 40 MG tablet Commonly known as: PRAVACHOL Take 40 mg by mouth at bedtime.   ranitidine 150 MG tablet Commonly known as: ZANTAC ranitidine 150 mg tablet  Take 1 tablet twice a day by oral route.   risperiDONE 0.5 MG tablet Commonly known as: RISPERDAL risperidone 0.5 mg tablet   rizatriptan 10 MG tablet Commonly known as: MAXALT Take 10 mg by mouth 2 (two) times daily. May repeat in 2 hours if needed   rOPINIRole 0.25 MG tablet Commonly known as: REQUIP Take 0.75 mg by mouth 3 (three) times daily.   senna 8.6 MG tablet Commonly known as: SENOKOT Take 1-4 tablets (8.6-34.4 mg total) by mouth daily as needed for constipation.   sertraline 100 MG tablet Commonly known as: ZOLOFT Take 1.5 tablets (150 mg total) by mouth daily.   tamsulosin 0.4 MG Caps capsule Commonly known as: FLOMAX TAKE 1 CAPSULE(0.4 MG) BY MOUTH DAILY   topiramate 100 MG tablet Commonly known as: TOPAMAX Take 0.5 tablets (50 mg total) by mouth at bedtime. For mood control   torsemide 20 MG tablet Commonly known as: DEMADEX   traMADol 50 MG tablet Commonly known as: ULTRAM Take 2 tablets (100 mg total) by mouth 2 (two) times daily for 15 days.   verapamil 120 MG CR tablet Commonly known as: CALAN-SR Take 1 tablet (120 mg total) by mouth at bedtime. For high blood pressure   vitamin B-12 1000 MCG tablet Commonly  known as: CYANOCOBALAMIN Take 1,000 mcg by mouth daily.       Allergies:  Allergies  Allergen Reactions  . Prolixin Decanoate [Fluphenazine]   . Benztropine Other (See Comments)    Hair fall out  Suicide thoughts  . Buprenorphine   . Buprenorphine Hcl Other (See Comments)    Unable to void  . Carbidopa-Levodopa Nausea Only  . Codeine Hives    REACTION: hives  . Cymbalta [Duloxetine Hcl] Other (See Comments)    Altered mental status  . Duloxetine  Other (See Comments)    Altered mental status Alopecia, visual hallucinations, nightmares  . Gabapentin Swelling  . Lyrica [Pregabalin] Other (See Comments)    Alopecia, visual hallucinations, nightmares Altered mental status  . Morphine   . Morphine And Related Other (See Comments)    Unable to void  . Nortripytline Hcl [Nortriptyline] Other (See Comments)    Hair loss and night mares   . Nsaids     REACTION: palpitations, diaphoresis  . Penicillins Nausea And Vomiting and Other (See Comments)    REACTION: upset stomach Has patient had a PCN reaction causing immediate rash, facial/tongue/throat swelling, SOB or lightheadedness with hypotension: No Has patient had a PCN reaction causing severe rash involving mucus membranes or skin necrosis: No Has patient had a PCN reaction that required hospitalization: No Has patient had a PCN reaction occurring within the last 10 years: No If all of the above answers are "NO", then may proceed with Cephalosporin use.  . Tolmetin Other (See Comments)    REACTION: palpitations, diaphoresis  . Wellbutrin [Bupropion]     Constipation, mood swings    Family History: Family History  Problem Relation Age of Onset  . Cancer Mother   . Hypertension Father   . Heart disease Father   . Alcohol abuse Father   . Breast cancer Neg Hx     Social History:  reports that she quit smoking about 24 years ago. Her smoking use included cigarettes. She has a 30.00 pack-year smoking history. She has  never used smokeless tobacco. She reports that she does not drink alcohol and does not use drugs.   Physical Exam: BP (!) 141/87   Pulse (!) 111   Ht 5' 5" (1.651 m)   Wt (!) 235 lb (106.6 kg)   BMI 39.11 kg/m   Constitutional:  Well nourished. Alert and oriented, No acute distress. HEENT: [place] AT, mask in place  Trachea midline Cardiovascular: No clubbing, cyanosis, or edema. Respiratory: Normal respiratory effort, no increased work of breathing. Neurologic: Grossly intact, no focal deficits, moving all 4 extremities. Psychiatric: Normal mood and affect.   Laboratory Data: Lab Results  Component Value Date   CREATININE 0.89 08/30/2019    Lab Results  Component Value Date   HGBA1C 6.2 (H) 03/06/2017    Urinalysis Component     Latest Ref Rng & Units 08/30/2019  Specific Gravity, UA     1.005 - 1.030 1.010  pH, UA     5.0 - 7.5 5.0  Color, UA     Yellow Yellow  Appearance Ur     Clear Clear  Leukocytes,UA     Negative Negative  Protein,UA     Negative/Trace Negative  Glucose, UA     Negative 3+ (A)  Ketones, UA     Negative Trace (A)  RBC, UA     Negative Trace (A)  Bilirubin, UA     Negative Negative  Urobilinogen, Ur     0.2 - 1.0 mg/dL 0.2  Nitrite, UA     Negative Negative  Microscopic Examination      See below:   Component     Latest Ref Rng & Units 08/30/2019         2:45 PM  WBC, UA     0 - 5 /hpf 6-10 (A)  RBC     3.77 - 5.28 x10E6/uL None seen  Epithelial Cells (non renal)     0 - 10 /hpf >10 (H)  Renal Epithel, UA  None seen /hpf 0-10 (A)  Mucus, UA     Not Estab. Present (A)  Bacteria, UA     None seen/Few Few (A)  Yeast, UA     None seen Present (A)   I have reviewed the labs.  Pertinent Imaging: CLINICAL DATA:  Nephrolithiasis  EXAM: ABDOMEN - 1 VIEW  COMPARISON:  08/23/2019  FINDINGS: Previously noted distal left ureteral calculus has migrated slightly distally, but remains within the expected distal ureter.  Calculus measures roughly 4 mm. No additional nephro or urolithiasis. Phleboliths noted within the pelvis. Normal abdominal gas pattern.  IMPRESSION: Slight interval antegrade progression of left ureteral calculus.   Electronically Signed   By: Fidela Salisbury MD   On: 08/31/2019 21:27 I have independently reviewed the films.  See HPI.   Assessment & Plan:    1. Left ureteral stone KUB demonstrates that stone remains in the distal ureter Patient is not wanting to pursue definitive therapy at this time, I explained that URS would be the first lined therapy if stone(s) do not pass, but ESWL is the procedure with the least morbidity and lowest complication rate, but URS has a greater stone-free rate in a single procedure - after discussion regarding each treatment modality - she would like to have left URS/LL/ureteral stent placement schedule left ureteroscopy with laser lithotripsy and ureteral stent placement - explained to the patient how the procedure is performed and the risks involved - informed patient that they will have a stent placed during the procedure and will remain in place after the procedure for a short time.  - stent may be removed in the office with a cystoscope or patient may be instructed to remove the stent themselves by the string - described "stent pain" as feelings of needing to urinate/overactive bladder and a warm, tingling sensation to intense pain in the affected flank - residual stones within the kidney or ureter may be present after the procedure and may need to have these addressed at a different encounter - injury to the ureter is the most common intra-operative risk, it may result in an open procedure to correct the defect - infection and bleeding are also risks - explained the risks of general anesthesia, such as: MI, CVA, paralysis, coma and/or death. Continue metabolic expulsion therapy in the meantime, tamsulosin 0.4 mg  Patient is advised that if they  should start to experience pain that is not able to be controlled with pain medication, intractable nausea and/or vomiting and/or fevers greater than 103 or shaking chills to contact the office immediately or seek treatment in the emergency department for emergent intervention.    2. History of hematuria - high risk Hematuria work up completed in 06/2019 - findings positive for nephrolithiaisis No report of gross hematuria  UA today negative for micro heme RTC in one year for UA - patient to report any gross hematuria in the interim    3. OAB Will address after expulsion or treatment of stone   [place] [address] [place], [place] [address] [phone]

## 2019-08-30 ENCOUNTER — Other Ambulatory Visit: Payer: Self-pay | Admitting: Radiology

## 2019-08-30 ENCOUNTER — Other Ambulatory Visit: Payer: Self-pay

## 2019-08-30 ENCOUNTER — Ambulatory Visit
Admission: RE | Admit: 2019-08-30 | Discharge: 2019-08-30 | Disposition: A | Discharge status: Home / Self Care | Payer: Medicare HMO | Source: Ambulatory Visit | Attending: Urology | Admitting: Urology

## 2019-08-30 ENCOUNTER — Ambulatory Visit (INDEPENDENT_AMBULATORY_CARE_PROVIDER_SITE_OTHER): Payer: Medicare HMO | Admitting: Urology

## 2019-08-30 ENCOUNTER — Ambulatory Visit
Admission: RE | Admit: 2019-08-30 | Discharge: 2019-08-30 | Disposition: A | Discharge status: Home / Self Care | Payer: Medicare HMO | Attending: Urology | Admitting: Urology

## 2019-08-30 VITALS — BP 141/87 | HR 111 | Ht 65.0 in | Wt 235.0 lb

## 2019-08-30 DIAGNOSIS — R3129 Other microscopic hematuria: Secondary | ICD-10-CM

## 2019-08-30 DIAGNOSIS — N201 Calculus of ureter: Secondary | ICD-10-CM

## 2019-08-30 DIAGNOSIS — N200 Calculus of kidney: Secondary | ICD-10-CM | POA: Diagnosis not present

## 2019-08-30 DIAGNOSIS — I878 Other specified disorders of veins: Secondary | ICD-10-CM | POA: Diagnosis not present

## 2019-08-30 LAB — MICROSCOPIC EXAMINATION
Epithelial Cells (non renal): >10 /hpf — ABNORMAL HIGH (ref 0–10)
RBC, Urine: NONE SEEN /hpf (ref 0–2)

## 2019-08-30 LAB — URINALYSIS, COMPLETE
Bilirubin, UA: NEGATIVE
Leukocytes,UA: NEGATIVE
Nitrite, UA: NEGATIVE
Protein,UA: NEGATIVE
Specific Gravity, UA: 1.01 (ref 1.005–1.030)
Urobilinogen, Ur: 0.2 mg/dL (ref 0.2–1.0)
pH, UA: 5 (ref 5.0–7.5)

## 2019-08-31 ENCOUNTER — Telehealth: Payer: Self-pay | Admitting: Family Medicine

## 2019-08-31 ENCOUNTER — Encounter: Payer: Self-pay | Admitting: Urology

## 2019-08-31 LAB — CBC WITH DIFFERENTIAL/PLATELET
Basophils Absolute: 0.1 10*3/uL (ref 0.0–0.2)
Basos: 1 %
EOS (ABSOLUTE): 0.1 10*3/uL (ref 0.0–0.4)
Eos: 2 %
Hematocrit: 41.8 % (ref 34.0–46.6)
Hemoglobin: 13.7 g/dL (ref 11.1–15.9)
Immature Grans (Abs): 0 10*3/uL (ref 0.0–0.1)
Immature Granulocytes: 0 %
Lymphocytes Absolute: 1.5 10*3/uL (ref 0.7–3.1)
Lymphs: 22 %
MCH: 29.3 pg (ref 26.6–33.0)
MCHC: 32.8 g/dL (ref 31.5–35.7)
MCV: 89 fL (ref 79–97)
Monocytes Absolute: 0.3 10*3/uL (ref 0.1–0.9)
Monocytes: 5 %
Neutrophils Absolute: 4.9 10*3/uL (ref 1.4–7.0)
Neutrophils: 70 %
Platelets: 235 10*3/uL (ref 150–450)
RBC: 4.68 x10E6/uL (ref 3.77–5.28)
RDW: 12.8 % (ref 11.7–15.4)
WBC: 6.9 10*3/uL (ref 3.4–10.8)

## 2019-08-31 LAB — BASIC METABOLIC PANEL
BUN/Creatinine Ratio: 11 — ABNORMAL LOW (ref 12–28)
BUN: 10 mg/dL (ref 8–27)
CO2: 22 mmol/L (ref 20–29)
Calcium: 9.6 mg/dL (ref 8.7–10.3)
Chloride: 97 mmol/L (ref 96–106)
Creatinine, Ser: 0.89 mg/dL (ref 0.57–1.00)
GFR calc Af Amer: 81 mL/min/{1.73_m2} (ref >59)
GFR calc non Af Amer: 71 mL/min/{1.73_m2} (ref >59)
Glucose: 435 mg/dL — ABNORMAL HIGH (ref 65–99)
Potassium: 4.2 mmol/L (ref 3.5–5.2)
Sodium: 137 mmol/L (ref 134–144)

## 2019-08-31 NOTE — Telephone Encounter (Signed)
-----   Message from Nori Riis, PA-C sent at 08/31/2019  8:02 AM EDT ----- Mrs. Perfecto's glucose level is 435, so she should speak with the provider who manages her diabetes to see if her diabetic medication needs to be changed or adjusted.

## 2019-08-31 NOTE — Telephone Encounter (Signed)
LMOM to contact Diabetic management provider.

## 2019-09-02 LAB — CULTURE, URINE COMPREHENSIVE

## 2019-09-03 ENCOUNTER — Encounter: Payer: Self-pay | Admitting: Psychiatry

## 2019-09-03 ENCOUNTER — Telehealth (INDEPENDENT_AMBULATORY_CARE_PROVIDER_SITE_OTHER): Payer: Medicare HMO | Admitting: Psychiatry

## 2019-09-03 ENCOUNTER — Other Ambulatory Visit: Payer: Self-pay

## 2019-09-03 DIAGNOSIS — F5105 Insomnia due to other mental disorder: Secondary | ICD-10-CM

## 2019-09-03 DIAGNOSIS — F251 Schizoaffective disorder, depressive type: Secondary | ICD-10-CM | POA: Diagnosis not present

## 2019-09-03 DIAGNOSIS — F411 Generalized anxiety disorder: Secondary | ICD-10-CM

## 2019-09-03 DIAGNOSIS — G2119 Other drug induced secondary parkinsonism: Secondary | ICD-10-CM | POA: Diagnosis not present

## 2019-09-03 DIAGNOSIS — F4310 Post-traumatic stress disorder, unspecified: Secondary | ICD-10-CM | POA: Diagnosis not present

## 2019-09-03 MED ORDER — OLANZAPINE 5 MG PO TABS: 2.5000 mg | ORAL_TABLET | Freq: Every day | ORAL | 1 refills | Status: DC

## 2019-09-03 NOTE — Progress Notes (Signed)
Provider Location : [facility] Patient Location : Home  Virtual Visit via Telephone Note  I connected with Heather Haley on 09/03/19 at  1:20 PM EDT by telephone and verified that I am speaking with the correct person using two identifiers.   I discussed the limitations, risks, security and privacy concerns of performing an evaluation and management service by telephone and the availability of in person appointments. I also discussed with the patient that there may be a patient responsible charge related to this service. The patient expressed understanding and agreed to proceed.     I discussed the assessment and treatment plan with the patient. The patient was provided an opportunity to ask questions and all were answered. The patient agreed with the plan and demonstrated an understanding of the instructions.   The patient was advised to call back or seek an in-person evaluation if the symptoms worsen or if the condition fails to improve as anticipated.   [place] MD OP Progress Note  09/03/2019 3:07 PM Heather Haley  MRN:  [id number]  Chief Complaint:  Chief Complaint    Follow-up     HPI: Heather Haley is a 59 year old Caucasian female, lives in [place], has a history of schizoaffective disorder, PTSD, GAD, drug-induced Parkinson's disease, migraine headaches, memory problems, diabetes melitis, chronic pain was evaluated by phone today.  Patient preferred to do a phone call.  Patient today reports she is currently struggling with uncontrolled diabetes melitis as well as heart palpitation.  She reports she has upcoming appointment with her provider who is managing her diabetes melitis.  She is also on Holter monitor for heart palpitations and has upcoming cardiology appointment on 7/28 for reevaluation.  Patient reports mood wise she is doing okay.  She denies any significant depression or anxiety.  Patient reports sleep and appetite as fair.  She reports tremors as  improving.  Patient denies any suicidality, homicidality or perceptual disturbances.  Patient appeared to be alert oriented to person place time and situation during the visit today.  Patient denies any other concerns today.  Visit Diagnosis:    ICD-10-CM   1. Schizoaffective disorder, depressive type ([place])  F25.1 OLANZapine (ZYPREXA) 5 MG tablet  2. PTSD (post-traumatic stress disorder)  F43.10   3. Generalized anxiety disorder  F41.1   4. Insomnia due to mental disorder  F51.05   5. Drug-induced Parkinson's disease ([place])  G21.19    stable    Past Psychiatric History: I have reviewed past psychiatric history from my progress note on 05/25/2017.  Past Medical History:  Past Medical History:  Diagnosis Date  . Anxiety   . Asthma   . Chronic kidney disease   . Chronic pain    previously saw Dr. Consuela Mimes in pain clinic, then saw pain specialist in [place]  . Depression   . Diabetes mellitus ([place])   . Frequency of urination   . GERD (gastroesophageal reflux disease)   . Headache(784.0)   . High cholesterol   . Hypertension   . IBS (irritable bowel syndrome)   . Left ankle instability   . Left knee DJD   . Lumbar Degenerative Disc Disease of  10/11/2014  . Neuromuscular disorder ([place])   . Osteoarthritis of hip (Right) 05/05/2015  . Other enthesopathy of ankle and tarsus 12/15/2009   Qualifier: Diagnosis of  By: Oneida Alar MD, KARL    . Parkinson's disease ([place])   . Peripheral sensory neuropathy (Bilateral) 11/19/2014  . Postoperative nausea and vomiting   .  Schizophrenia [facility])     Past Surgical History:  Procedure Laterality Date  . ABDOMINAL HYSTERECTOMY    . ANKLE SURGERY    . APPENDECTOMY    . COLONOSCOPY  2013  . COLONOSCOPY WITH PROPOFOL N/A 04/25/2017   Procedure: COLONOSCOPY WITH PROPOFOL;  Surgeon: Manya Silvas, MD;  Location: [facility] ENDOSCOPY;  Service: Endoscopy;  Laterality: N/A;  . ESOPHAGOGASTRODUODENOSCOPY (EGD) WITH PROPOFOL N/A 10/03/2014   Procedure:  ESOPHAGOGASTRODUODENOSCOPY (EGD) WITH PROPOFOL;  Surgeon: Josefine Class, MD;  Location: [facility] ENDOSCOPY;  Service: Endoscopy;  Laterality: N/A;  . ESOPHAGOGASTRODUODENOSCOPY (EGD) WITH PROPOFOL  04/25/2017   Procedure: ESOPHAGOGASTRODUODENOSCOPY (EGD) WITH PROPOFOL;  Surgeon: Manya Silvas, MD;  Location: [facility] ENDOSCOPY;  Service: Endoscopy;;  . KNEE ARTHROSCOPY  1997   left knee  . LEFT HEART CATH AND CORONARY ANGIOGRAPHY Left 11/10/2017   Procedure: LEFT HEART CATH AND CORONARY ANGIOGRAPHY;  Surgeon: Isaias Cowman, MD;  Location: [facility] CV LAB;  Service: Cardiovascular;  Laterality: Left;  . LEFT HEART CATHETERIZATION WITH CORONARY ANGIOGRAM N/A 01/11/2013   Procedure: LEFT HEART CATHETERIZATION WITH CORONARY ANGIOGRAM;  Surgeon: Sinclair Grooms, MD;  Location: [facility] CATH LAB;  Service: Cardiovascular;  Laterality: N/A;  . TOTAL KNEE ARTHROPLASTY  08/30/2011   Procedure: TOTAL KNEE ARTHROPLASTY;  Surgeon: Lorn Junes, MD;  Location: [facility];  Service: Orthopedics;  Laterality: Left;    Family Psychiatric History: I have reviewed family psychiatric history from my progress note on 05/25/2017.  Family History:  Family History  Problem Relation Age of Onset  . Cancer Mother   . Hypertension Father   . Heart disease Father   . Alcohol abuse Father   . Breast cancer Neg Hx     Social History: I have reviewed social history from my progress note on 05/25/2017. Social History   Socioeconomic History  . Marital status: Divorced    Spouse name: Not on file  . Number of children: 2  . Years of education: 50  . Highest education level: 11th grade  Occupational History    Employer: [employer]  Tobacco Use  . Smoking status: Former Smoker    Packs/day: 2.00    Years: 15.00    Pack years: 30.00    Types: Cigarettes    Quit date: 02/09/1995    Years since quitting: 24.5  . Smokeless tobacco: Never Used  Vaping Use  . Vaping Use: Never used  Substance and Sexual  Activity  . Alcohol use: No    Alcohol/week: 0.0 standard drinks  . Drug use: No  . Sexual activity: Not Currently  Other Topics Concern  . Not on file  Social History Narrative   Patient lives at home alone. Patient works at [employer].   Caffeine daily- 2   Right handed.   Education- 11 th grade   Social Determinants of Radio broadcast assistant Strain:   . Difficulty of Paying Living Expenses:   Food Insecurity:   . Worried About Charity fundraiser in the Last Year:   . Arboriculturist in the Last Year:   Transportation Needs:   . Film/video editor (Medical):   Marland Kitchen Lack of Transportation (Non-Medical):   Physical Activity:   . Days of Exercise per Week:   . Minutes of Exercise per Session:   Stress:   . Feeling of Stress :   Social Connections:   . Frequency of Communication with Friends and Family:   . Frequency of Social  Gatherings with Friends and Family:   . Attends Religious Services:   . Active Member of Clubs or Organizations:   . Attends Archivist Meetings:   Marland Kitchen Marital Status:     Allergies:  Allergies  Allergen Reactions  . Prolixin Decanoate [Fluphenazine]   . Benztropine Other (See Comments)    Hair fall out  Suicide thoughts  . Buprenorphine   . Buprenorphine Hcl Other (See Comments)    Unable to void  . Carbidopa-Levodopa Nausea Only  . Codeine Hives    REACTION: hives  . Cymbalta [Duloxetine Hcl] Other (See Comments)    Altered mental status  . Duloxetine Other (See Comments)    Altered mental status Alopecia, visual hallucinations, nightmares  . Gabapentin Swelling  . Lyrica [Pregabalin] Other (See Comments)    Alopecia, visual hallucinations, nightmares Altered mental status  . Morphine   . Morphine And Related Other (See Comments)    Unable to void  . Nortripytline Hcl [Nortriptyline] Other (See Comments)    Hair loss and night mares   . Nsaids     REACTION: palpitations, diaphoresis  . Penicillins Nausea And  Vomiting and Other (See Comments)    REACTION: upset stomach Has patient had a PCN reaction causing immediate rash, facial/tongue/throat swelling, SOB or lightheadedness with hypotension: No Has patient had a PCN reaction causing severe rash involving mucus membranes or skin necrosis: No Has patient had a PCN reaction that required hospitalization: No Has patient had a PCN reaction occurring within the last 10 years: No If all of the above answers are "NO", then may proceed with Cephalosporin use.  . Tolmetin Other (See Comments)    REACTION: palpitations, diaphoresis  . Wellbutrin [Bupropion]     Constipation, mood swings    Metabolic Disorder Labs: Lab Results  Component Value Date   HGBA1C 6.2 (H) 03/06/2017   MPG 131.24 03/06/2017   MPG 108 05/17/2015   Lab Results  Component Value Date   PROLACTIN 64.2 (H) 03/06/2017   Lab Results  Component Value Date   CHOL 146 08/22/2018   TRIG 172 (H) 08/22/2018   HDL 49 08/22/2018   CHOLHDL 3.0 08/22/2018   VLDL 34 08/22/2018   LDLCALC 63 08/22/2018   LDLCALC 59 03/06/2017   Lab Results  Component Value Date   TSH 0.959 08/22/2018   TSH 2.766 08/24/2017    Therapeutic Level Labs: No results found for: LITHIUM No results found for: VALPROATE No components found for:  CBMZ  Current Medications: Current Outpatient Medications  Medication Sig Dispense Refill  . acetaminophen (TYLENOL) 500 MG tablet Take 500 mg by mouth every 6 (six) hours as needed.    Marland Kitchen albuterol (PROVENTIL HFA;VENTOLIN HFA) 108 (90 Base) MCG/ACT inhaler Inhale 2 puffs into the lungs every 6 (six) hours as needed for wheezing or shortness of breath.    . Alcohol Swabs (B-D SINGLE USE SWABS REGULAR) PADS     . amantadine (SYMMETREL) 100 MG capsule Take 1 capsule (100 mg total) by mouth 2 (two) times daily. 60 capsule 2  . aspirin 81 MG tablet Take 81 mg by mouth daily.     . benztropine (COGENTIN) 1 MG tablet benztropine 1 mg tablet    . Blood Glucose  Calibration (ACCU-CHEK AVIVA) SOLN     . Blood Glucose Monitoring Suppl (ACCU-CHEK AVIVA PLUS) w/Device KIT     . cholecalciferol (VITAMIN D) 1000 units tablet Take 1,000 Units by mouth daily.    . cyclobenzaprine (FLEXERIL) 10 MG  tablet Take 10 mg by mouth 2 (two) times daily.     . diphenhydrAMINE (BENADRYL ALLERGY) 25 mg capsule Take 1 capsule (25 mg total) by mouth daily as needed. For side effects of tremors due to zyprexa 30 capsule 2  . docusate sodium (COLACE) 100 MG capsule Take 100 mg by mouth 2 (two) times daily.     . furosemide (LASIX) 20 MG tablet furosemide 20 mg tablet    . Galcanezumab-gnlm (EMGALITY) 120 MG/ML SOAJ Inject into the skin.    Marland Kitchen glimepiride (AMARYL) 1 MG tablet Take 1 mg by mouth daily with breakfast.     . HYDROcodone-acetaminophen (NORCO/VICODIN) 5-325 MG tablet Take 0.5 tablets by mouth every 12 (twelve) hours as needed for moderate pain.    . hydroxypropyl methylcellulose / hypromellose (ISOPTO TEARS / GONIOVISC) 2.5 % ophthalmic solution Place 1 drop into both eyes every 4 (four) hours as needed for dry eyes.    Marland Kitchen losartan (COZAAR) 25 MG tablet Take 25 mg by mouth daily.    . metFORMIN (GLUCOPHAGE) 500 MG tablet Take 1 tablet (500 mg total) by mouth 2 (two) times daily with a meal. For diabetes management 10 tablet 0  . montelukast (SINGULAIR) 10 MG tablet Take by mouth.    . OLANZapine (ZYPREXA) 5 MG tablet Take 0.5 tablets (2.5 mg total) by mouth at bedtime. 45 tablet 1  . omeprazole (PRILOSEC) 40 MG capsule Take 40 mg by mouth daily as needed (heartburn).     . ondansetron (ZOFRAN) 4 MG tablet Take 4 mg by mouth every 8 (eight) hours as needed for nausea or vomiting.    . pantoprazole (PROTONIX) 40 MG tablet pantoprazole 40 mg tablet,delayed release    . pravastatin (PRAVACHOL) 40 MG tablet Take 40 mg by mouth at bedtime.     . ranitidine (ZANTAC) 150 MG tablet ranitidine 150 mg tablet  Take 1 tablet twice a day by oral route.    . rizatriptan (MAXALT) 10  MG tablet Take 10 mg by mouth 2 (two) times daily. May repeat in 2 hours if needed     . rOPINIRole (REQUIP) 0.25 MG tablet Take 0.75 mg by mouth 3 (three) times daily.    Marland Kitchen senna (SENOKOT) 8.6 MG tablet Take 1-4 tablets (8.6-34.4 mg total) by mouth daily as needed for constipation.    . sertraline (ZOLOFT) 100 MG tablet Take 1.5 tablets (150 mg total) by mouth daily. 135 tablet 1  . tamsulosin (FLOMAX) 0.4 MG CAPS capsule TAKE 1 CAPSULE(0.4 MG) BY MOUTH DAILY 90 capsule 0  . topiramate (TOPAMAX) 100 MG tablet Take 0.5 tablets (50 mg total) by mouth at bedtime. For mood control 30 tablet 1  . torsemide (DEMADEX) 20 MG tablet     . traMADol (ULTRAM) 50 MG tablet Take 2 tablets (100 mg total) by mouth 2 (two) times daily for 15 days. 30 tablet 1  . traMADol (ULTRAM-ER) 100 MG 24 hr tablet Take 100 mg by mouth at bedtime.    . valACYclovir (VALTREX) 1000 MG tablet Take 1,000 mg by mouth 3 (three) times daily.    . verapamil (CALAN-SR) 120 MG CR tablet Take 1 tablet (120 mg total) by mouth at bedtime. For high blood pressure 90 tablet 1  . vitamin B-12 (CYANOCOBALAMIN) 1000 MCG tablet Take 1,000 mcg by mouth daily.     No current facility-administered medications for this visit.     Musculoskeletal: Strength & Muscle Tone: UTA Gait & Station: UTA Patient leans: N/A  Psychiatric  Specialty Exam: Review of Systems  Neurological: Positive for tremors.  Psychiatric/Behavioral: Negative for agitation, behavioral problems, confusion, decreased concentration, dysphoric mood, hallucinations, self-injury, sleep disturbance and suicidal ideas. The patient is not nervous/anxious and is not hyperactive.   All other systems reviewed and are negative.   There were no vitals taken for this visit.There is no height or weight on file to calculate BMI.  General Appearance: UTA  Eye Contact:  UTA  Speech:  Clear and Coherent  Volume:  Normal  Mood:  Euthymic  Affect:  UTA  Thought Process:  Goal Directed  and Descriptions of Associations: Intact  Orientation:  Full (Time, Place, and Person)  Thought Content: Logical   Suicidal Thoughts:  No  Homicidal Thoughts:  No  Memory:  Immediate;   Fair Recent;   Fair Remote;   Fair  Judgement:  Fair  Insight:  Fair  Psychomotor Activity:  UTA  Concentration:  Concentration: Fair and Attention Span: Fair  Recall:  [employer] of Knowledge: Fair  Language: Fair  Akathisia:  No  Handed:  Right  AIMS (if indicated): UTA  Assets:  Communication Skills Desire for Improvement Housing Social Support  ADL's:  Intact  Cognition: WNL  Sleep:  Fair   Screenings: AIMS     Admission (Discharged) from OP Visit from 08/30/2018 in [facility] Admission (Discharged) from 08/22/2018 in [facility] Office Visit from 12/01/2017 in [facility] Office Visit from 09/20/2017 in [facility] Office Visit from 06/29/2017 in [place] Total Score 0 0 1 0 6    AUDIT     Admission (Discharged) from OP Visit from 08/30/2018 in [facility] Admission (Discharged) from 08/22/2018 in [place] [facility] Admission (Discharged) from 03/03/2017 in [facility]  Alcohol Use Disorder Identification Test Final Score (AUDIT) 0 0 0    GAD-7     Office Visit from 08/22/2015 in [place] at [facility]  Total GAD-7 Score 6    PHQ2-9     Office Visit from 03/23/2016 in [place] Office Visit from 08/22/2015 in [facility] from 08/06/2015 in [place] Office Visit from 06/10/2015 in [facility] Clinical Support from 05/19/2015 in [place]  PHQ-2 Total Score 0 1  0 0 0       Assessment and Plan: ARYAA BUNTING is a 59 year old Caucasian female who has a history of schizoaffective disorder, PTSD, migraine headaches, drug-induced Parkinson's disease was evaluated by phone today.  Patient is currently stable with regards to her mood symptoms however does have comorbid heart palpitation, uncontrolled diabetes melitis and is currently under the care of her providers for the same.  Discussed plan as noted below.  Plan Schizoaffective disorder-stable We will continue Zyprexa low dosage of 2.5 mg p.o. nightly at this time. If she continues to have uncontrolled diabetes in spite of medication changes could consider discontinuing Zyprexa due to  side effects profile.  However patient has tried and failed multiple antipsychotics in the past and this is the one that works the best for her.  And she is also on a very low dosage. Zoloft 150 mg p.o. daily  PTSD-stable Zoloft 150 mg p.o. daily  GAD-stable Continue medications as prescribed  Neuroleptic induced Parkinson's disease-improving  Amantadine 100 mg p.o. twice daily-reduced dosage  I have reviewed cardiology notes- Dr.Paraschos-dated 08/29/2019-' patient on Holter monitoring.  Patient to be reevaluated on 09/05/2019.'   Follow-up in clinic in 4 weeks or sooner if needed.  I have spent atleast 20 minutes non face to face with patient today. More than 50 % of the time was spent for preparing to see the patient ( e.g., review of test, records ), obtaining and to review and separately obtained history , ordering medications and test ,psychoeducation and supportive psychotherapy and care coordination,as well as documenting clinical information in electronic health record. This note was generated in part or whole with voice recognition software. Voice recognition is usually quite accurate but there are transcription errors that can and very often do occur. I apologize for any typographical errors that were  not detected and corrected.       Ursula Alert, MD 09/03/2019, 3:06 PM

## 2019-09-04 ENCOUNTER — Other Ambulatory Visit: Payer: Self-pay | Admitting: Radiology

## 2019-09-04 DIAGNOSIS — R300 Dysuria: Secondary | ICD-10-CM | POA: Diagnosis not present

## 2019-09-04 DIAGNOSIS — E669 Obesity, unspecified: Secondary | ICD-10-CM | POA: Diagnosis not present

## 2019-09-04 DIAGNOSIS — E119 Type 2 diabetes mellitus without complications: Secondary | ICD-10-CM | POA: Diagnosis not present

## 2019-09-04 DIAGNOSIS — Z87442 Personal history of urinary calculi: Secondary | ICD-10-CM | POA: Diagnosis not present

## 2019-09-04 DIAGNOSIS — E1165 Type 2 diabetes mellitus with hyperglycemia: Secondary | ICD-10-CM | POA: Diagnosis not present

## 2019-09-04 DIAGNOSIS — N200 Calculus of kidney: Secondary | ICD-10-CM | POA: Diagnosis not present

## 2019-09-04 DIAGNOSIS — R102 Pelvic and perineal pain: Secondary | ICD-10-CM | POA: Diagnosis not present

## 2019-09-04 DIAGNOSIS — Z6839 Body mass index (BMI) 39.0-39.9, adult: Secondary | ICD-10-CM | POA: Diagnosis not present

## 2019-09-05 ENCOUNTER — Inpatient Hospital Stay: Admission: RE | Admit: 2019-09-05 | Payer: Medicare HMO | Source: Ambulatory Visit

## 2019-09-06 ENCOUNTER — Encounter
Admission: RE | Admit: 2019-09-06 | Discharge: 2019-09-06 | Disposition: A | Discharge status: Home / Self Care | Payer: Medicare HMO | Source: Ambulatory Visit | Attending: Urology | Admitting: Urology

## 2019-09-06 ENCOUNTER — Other Ambulatory Visit: Payer: Self-pay

## 2019-09-06 HISTORY — DX: Personal history of urinary calculi: Z87.442

## 2019-09-06 NOTE — Progress Notes (Signed)
[facility] Perioperative Services  Pre-Admission/Anesthesia Testing Clinical Review  Date: 09/06/19  Patient Demographics:  Name: Heather Haley DOB:   07-Jan-1961 MRN:   [id number]  Planned Surgical Procedure(s):    Case: [id number] Date/Time: 09/11/19 0715   Procedure: CYSTOSCOPY/URETEROSCOPY/HOLMIUM LASER/STENT PLACEMENT (Left )   Anesthesia type: General   Pre-op diagnosis: left ureteral stone   Location: [facility] OR ROOM 10 / [place] ORS FOR ANESTHESIA GROUP   Surgeons: Abbie Sons, MD     NOTE: Available PAT nursing documentation and vital signs have been reviewed. Clinical nursing staff has updated patient's PMH/PSHx, current medication list, and drug allergies/intolerances to ensure comprehensive history available to assist in medical decision making as it pertains to the aforementioned surgical procedure and anticipated anesthetic course.   Clinical Discussion:  Heather Haley is a 59 y.o. female who is submitted for pre-surgical anesthesia review and clearance prior to her undergoing the above procedure. Patient is a Former Smoker (30 pack years; quit 02/1995). Pertinent PMH includes: atypical chest pain, palpitations HTN, HLD, T2DM, GERD ( on daily PPI), lumbar DDD, OA, CKD-II, asthma, peripheral neuropathy, peripheral edema, Parkinson's disease, anxiety, depression, schizophrenia, insomnia, chronic pain with opioid dependence.  Patient is followed by cardiology Saralyn Pilar, MD). She was last seen in the cardiology clinic on 08/29/2019; notes reviewed.  Patient complained of a 4 to 5-monthhistory of LEFT sided chest pain that radiated into her upper back.  Pain reported to be intermittent and nonexertional.  Patient also complained of intermittent palpitations with associated shortness of breath and dizziness.  She had not experienced any presyncope/syncope episodes.  Heart catheterization in 2014 revealed insignificant CAD. Echocardiogram from 08/23/2019  revealed a normal LVEF of 55% with mild LVH, no RWMA's, and trivial valvular insufficiencies.  HTN adequately controlled on currently prescribed regimen of verapamil, losartan, and torsemide.  Peripheral edema had improved.  HLD controlled on currently prescribed regimen.  Plans discussed for a 1 week event monitor study to further evaluate palpitations. Cardiac clearance for upcoming elective urological procedure initiated on 08/31/2019 with a LOW risk stratification. This patient is on daily antiplatelet therapy. She has been instructed on recommendations for holding her daily low-dose ASA for 7 days prior to her procedure. The patient has been instructed that her last dose of her anticoagulant will be on 09/04/2019.  Patient recently seen by her PCP (Ola Spurr MD) for issues with her T2DM diagnosis.  Patient was last seen in the office on 09/04/2019; notes reviewed.  Patient with recent elevation in her blood sugars to the 300-400s since 047/23/2021.  Patient on Metformin 1000 mg twice daily.  Additionally, patient has Amaryl to use for CBGs > 150.  Most recent A1c 7.4 mg/dL.  Of note, patient was on a corticosteroid taper earlier in July.  Questioning correlation.  Plans were made to titrate Amaryl dose over the next week based on her blood sugar readings at home.  Patient scheduled to follow-up with PCP in 1 week.  She denies previous intra-operative complications with anesthesia. She underwent a general anesthetic course here (ASA III) in 04/2017 with no documented complications.   Vitals with BMI 09/06/2019 08/30/2019 08/23/2019  Height _0  _1  _2   Weight 235 lbs 235 lbs 230 lbs  BMI 39.11 [phone] Systolic - [id number] Diastolic - 87 79  Pulse - 111 120  Some encounter information is confidential and restricted. Go to Review Flowsheets activity to see all data.    Providers/Specialists:  NOTE: Primary physician provider listed below. Patient may have been seen by APP or partner  within same practice.   PROVIDER ROLE LAST Claud Kelp, MD Urology (Surgeon) 08/30/2019  Tracie Harrier, MD Primary Care Provider 09/04/2019  Isaias Cowman, MD Cardiology 08/29/2019   Allergies:  Prolixin decanoate [fluphenazine], Benztropine, Buprenorphine hcl, Carbidopa-levodopa, Codeine, Cymbalta [duloxetine hcl], Gabapentin, Lyrica [pregabalin], Morphine and related, Nortripytline hcl [nortriptyline], Nsaids, Penicillins, and Wellbutrin [bupropion]  Current Home Medications:   . acetaminophen (TYLENOL) 500 MG tablet  . albuterol (PROVENTIL HFA;VENTOLIN HFA) 108 (90 Base) MCG/ACT inhaler  . aspirin EC 81 MG tablet  . Cholecalciferol (VITAMIN D-3) 125 MCG (5000 UT) TABS  . Cyanocobalamin (B-12 COMPLIANCE INJECTION IJ)  . cyclobenzaprine (FLEXERIL) 5 MG tablet  . docusate sodium (COLACE) 50 MG capsule  . glimepiride (AMARYL) 2 MG tablet  . HYDROcodone-acetaminophen (NORCO) 7.5-325 MG tablet  . metFORMIN (GLUCOPHAGE) 500 MG tablet  . montelukast (SINGULAIR) 10 MG tablet  . OLANZapine (ZYPREXA) 5 MG tablet  . omeprazole (PRILOSEC) 40 MG capsule  . ondansetron (ZOFRAN) 4 MG tablet  . pravastatin (PRAVACHOL) 40 MG tablet  . rizatriptan (MAXALT) 10 MG tablet  . sertraline (ZOLOFT) 100 MG tablet  . torsemide (DEMADEX) 20 MG tablet  . traMADol (ULTRAM) 50 MG tablet  . verapamil (CALAN-SR) 180 MG CR tablet  . Alcohol Swabs (B-D SINGLE USE SWABS REGULAR) PADS  . amantadine (SYMMETREL) 100 MG capsule  . Blood Glucose Calibration (ACCU-CHEK AVIVA) SOLN  . Blood Glucose Monitoring Suppl (ACCU-CHEK AVIVA PLUS) w/Device KIT  . clindamycin (CLEOCIN) 300 MG capsule  . diphenhydrAMINE (BENADRYL) 25 MG tablet  . hydroxypropyl methylcellulose / hypromellose (ISOPTO TEARS / GONIOVISC) 2.5 % ophthalmic solution  . nitrofurantoin, macrocrystal-monohydrate, (MACROBID) 100 MG capsule  . rOPINIRole (REQUIP) 0.25 MG tablet  . tamsulosin (FLOMAX) 0.4 MG CAPS capsule  . topiramate  (TOPAMAX) 100 MG tablet   No current facility-administered medications for this encounter.   History:   Past Medical History:  Diagnosis Date  . Anxiety   . Asthma   . Chronic kidney disease   . Chronic pain    previously saw Dr. Consuela Mimes in pain clinic, then saw pain specialist in [place]  . Depression   . Diabetes mellitus ([place])   . Frequency of urination   . GERD (gastroesophageal reflux disease)   . Headache(784.0)   . High cholesterol   . History of kidney stones   . Hypertension   . IBS (irritable bowel syndrome)   . Left ankle instability   . Left knee DJD   . Lumbar Degenerative Disc Disease of  10/11/2014  . Neuromuscular disorder ([place])   . Osteoarthritis of hip (Right) 05/05/2015  . Other enthesopathy of ankle and tarsus 12/15/2009   Qualifier: Diagnosis of  By: Oneida Alar MD, KARL    . Parkinson's disease ([place])   . Peripheral sensory neuropathy (Bilateral) 11/19/2014  . Postoperative nausea and vomiting   . Schizophrenia [facility])    Past Surgical History:  Procedure Laterality Date  . ABDOMINAL HYSTERECTOMY    . ANKLE SURGERY Left    x 2  . APPENDECTOMY    . CARDIAC CATHETERIZATION    . COLONOSCOPY  2013  . COLONOSCOPY WITH PROPOFOL N/A 04/25/2017   Procedure: COLONOSCOPY WITH PROPOFOL;  Surgeon: Manya Silvas, MD;  Location: [facility] ENDOSCOPY;  Service: Endoscopy;  Laterality: N/A;  . ESOPHAGOGASTRODUODENOSCOPY (EGD) WITH PROPOFOL N/A 10/03/2014   Procedure: ESOPHAGOGASTRODUODENOSCOPY (EGD) WITH PROPOFOL;  Surgeon: Josefine Class, MD;  Location: [facility] ENDOSCOPY;  Service: Endoscopy;  Laterality: N/A;  . ESOPHAGOGASTRODUODENOSCOPY (EGD) WITH PROPOFOL  04/25/2017   Procedure: ESOPHAGOGASTRODUODENOSCOPY (EGD) WITH PROPOFOL;  Surgeon: Manya Silvas, MD;  Location: [facility] ENDOSCOPY;  Service: Endoscopy;;  . JOINT REPLACEMENT Left    knee  . KNEE ARTHROSCOPY  1997   left knee  . LEFT HEART CATH AND CORONARY ANGIOGRAPHY Left 11/10/2017   Procedure: LEFT HEART CATH  AND CORONARY ANGIOGRAPHY;  Surgeon: Isaias Cowman, MD;  Location: [place] CV LAB;  Service: Cardiovascular;  Laterality: Left;  . LEFT HEART CATHETERIZATION WITH CORONARY ANGIOGRAM N/A 01/11/2013   Procedure: LEFT HEART CATHETERIZATION WITH CORONARY ANGIOGRAM;  Surgeon: Sinclair Grooms, MD;  Location: [facility] CATH LAB;  Service: Cardiovascular;  Laterality: N/A;  . TOTAL KNEE ARTHROPLASTY  08/30/2011   Procedure: TOTAL KNEE ARTHROPLASTY;  Surgeon: Lorn Junes, MD;  Location: [place];  Service: Orthopedics;  Laterality: Left;   Family History  Problem Relation Age of Onset  . Cancer Mother   . Hypertension Father   . Heart disease Father   . Alcohol abuse Father   . Breast cancer Neg Hx    Social History   Tobacco Use  . Smoking status: Former Smoker    Packs/day: 2.00    Years: 15.00    Pack years: 30.00    Types: Cigarettes    Quit date: 02/09/1995    Years since quitting: 24.5  . Smokeless tobacco: Never Used  Vaping Use  . Vaping Use: Never used  Substance Use Topics  . Alcohol use: No    Alcohol/week: 0.0 standard drinks  . Drug use: No    Pertinent Clinical Results:  LABS: Labs reviewed: Acceptable for surgery. Will need to recheck CBG on the morning of surgery; recent modifications have been made to her antidiabetic regimen; see HPI.   No visits with results within 3 Day(s) from this visit.  Latest known visit with results is:  Office Visit on 08/30/2019  Component Date Value Ref Range Status  . Specific Gravity, UA 08/30/2019 1.010  1.005 - 1.030 Final  . pH, UA 08/30/2019 5.0  5.0 - 7.5 Final  . Color, UA 08/30/2019 Yellow  Yellow Final  . Appearance Ur 08/30/2019 Clear  Clear Final  . Leukocytes,UA 08/30/2019 Negative  Negative Final  . Protein,UA 08/30/2019 Negative  Negative/Trace Final  . Glucose, UA 08/30/2019 3+* Negative Final  . Ketones, UA 08/30/2019 Trace* Negative Final  . RBC, UA 08/30/2019 Trace* Negative Final  . Bilirubin, UA 08/30/2019  Negative  Negative Final  . Urobilinogen, Ur 08/30/2019 0.2  0.2 - 1.0 mg/dL Final  . Nitrite, UA 08/30/2019 Negative  Negative Final  . Microscopic Examination 08/30/2019 See below:   Final  . Urine Culture, Comprehensive 08/30/2019 Final report   Final  . Organism ID, Bacteria 08/30/2019 Comment   Final   Comment: Mixed urogenital flora 50,000-100,000 colony forming units per mL   . WBC 08/30/2019 6.9  3.4 - 10.8 x10E3/uL Final  . RBC 08/30/2019 4.68  3.77 - 5.28 x10E6/uL Final  . Hemoglobin 08/30/2019 13.7  11.1 - 15.9 g/dL Final  . Hematocrit 08/30/2019 41.8  34.0 - 46.6 % Final  . MCV 08/30/2019 89  79 - 97 fL Final  . MCH 08/30/2019 29.3  26.6 - 33.0 pg Final  . MCHC 08/30/2019 32.8  31 - 35 g/dL Final  . RDW 08/30/2019 12.8  11.7 - 15.4 % Final  . Platelets 08/30/2019 235  150 - 450  x10E3/uL Final  . Neutrophils 08/30/2019 70  Not Estab. % Final  . Lymphs 08/30/2019 22  Not Estab. % Final  . Monocytes 08/30/2019 5  Not Estab. % Final  . Eos 08/30/2019 2  Not Estab. % Final  . Basos 08/30/2019 1  Not Estab. % Final  . Neutrophils Absolute 08/30/2019 4.9  1 - 7 x10E3/uL Final  . Lymphocytes Absolute 08/30/2019 1.5  0 - 3 x10E3/uL Final  . Monocytes Absolute 08/30/2019 0.3  0 - 0 x10E3/uL Final  . EOS (ABSOLUTE) 08/30/2019 0.1  0.0 - 0.4 x10E3/uL Final  . Basophils Absolute 08/30/2019 0.1  0 - 0 x10E3/uL Final  . Immature Granulocytes 08/30/2019 0  Not Estab. % Final  . Immature Grans (Abs) 08/30/2019 0.0  0.0 - 0.1 x10E3/uL Final  . Glucose 08/30/2019 435* 65 - 99 mg/dL Final  . BUN 08/30/2019 10  8 - 27 mg/dL Final  . Creatinine, Ser 08/30/2019 0.89  0.57 - 1.00 mg/dL Final  . GFR calc non Af Amer 08/30/2019 71  >59 mL/min/1.73 Final  . GFR calc Af Amer 08/30/2019 81  >59 mL/min/1.73 Final   Comment: **[facility] currently reports eGFR in compliance with the current**   recommendations of the [employer]. [facility] will   update reporting as new guidelines are  published from the NKF-ASN   Task force.   . BUN/Creatinine Ratio 08/30/2019 11* 12 - 28 Final  . Sodium 08/30/2019 137  134 - 144 mmol/L Final  . Potassium 08/30/2019 4.2  3.5 - 5.2 mmol/L Final  . Chloride 08/30/2019 97  96 - 106 mmol/L Final  . CO2 08/30/2019 22  20 - 29 mmol/L Final  . Calcium 08/30/2019 9.6  8.7 - 10.3 mg/dL Final  . WBC, UA 08/30/2019 6-10* 0 - 5 /hpf Final  . RBC 08/30/2019 None seen  0 - 2 /hpf Final  . Epithelial Cells (non renal) 08/30/2019 >10* 0 - 10 /hpf Final  . Renal Epithel, UA 08/30/2019 0-10* None seen /hpf Final  . Mucus, UA 08/30/2019 Present* Not Estab. Final  . Bacteria, UA 08/30/2019 Few* None seen/Few Final  . Yeast, UA 08/30/2019 Present* None seen Final    IMAGING / PROCEDURES: ECHOCARDIOGRAM done on 08/23/2019 1. Normal LV systolic function 2. Normal RV systolic function 3. Trivial MR, TR, and TR; no AR 4. No valvular stenosis 5. Mild LVH 6. Technically difficult study 7. LVEF > 55%  LEFT HEART CATHETERIZATION done on 11/10/2017 1. Near normal coronary anatomy  Ost LAD to Prox LAD lesion is 20% stenosed.  Mid RCA lesion is 20% stenosed. 2. Normal left ventricular function  LEXISCAN done on 10/19/2017 1. Normal LV function 2. Normal wall motion 3. Mild apical and lateral wall ischemia 4. LVEF 63%  Impression and Plan:  Heather Haley has been referred for pre-anesthesia review and clearance prior her undergoing the planned anesthetic and procedural courses. Available labs, pertinent testing, and imaging results were personally reviewed by me. This patient has been appropriately cleared by cardiology.   Based on clinical review performed today (09/06/19), barring and significant acute changes in the patient's overall condition, it is anticipated that shewill be able to proceed with the planned surgical intervention. Any acute changes in clinical condition may necessitate her procedure being postponed and/or cancelled.  Pre-surgical instructions were reviewed with the patient during her PAT appointment and questions were fielded by PAT clinical staff.  Honor Loh, MSN, APRN, FNP-C, CEN [place]  Peri-operative Services Nurse  Practitioner Phone: [phone] 09/06/19 11:56 AM  NOTE: This note has been prepared using Dragon dictation software. Despite my best ability to proofread, there is always the potential that unintentional transcriptional errors may still occur from this process.

## 2019-09-06 NOTE — Patient Instructions (Signed)
COVID TESTING Date: September 07, 2019 Testing site:  [place] Thru Hours:  7:16 am - 1:00 pm Once you are tested, you are asked to stay quarantined (avoiding public places) until after your surgery.   Your procedure is scheduled on: Tuesday September 11, 2019 Report to Day Surgery on the 2nd floor of the [facility]. To find out your arrival time, please call [phone] between 1PM - 3PM on: Monday September 10, 2019  REMEMBER: Instructions that are not followed completely may result in serious medical risk, up to and including death; or upon the discretion of your surgeon and anesthesiologist your surgery may need to be rescheduled.  Do not eat food after midnight the night before surgery.  No gum chewing, lozengers or hard candies.  You may however, drink CLEAR liquids up to 2 hours before you are scheduled to arrive for your surgery. Do not drink anything within 2 hours of your scheduled arrival time.  Clear liquids include: - water   Do NOT drink anything that is not on this list.  Type 1 and Type 2 diabetics should only drink water.   TAKE THESE MEDICATIONS THE MORNING OF SURGERY WITH A SIP OF WATER: TRAMADOL SERTRALINE (ZOLOFT) OMEPRAZOLE (take one the night before and one on the morning of surgery - helps to prevent nausea after surgery.)  Use inhalers on the day of surgery   Stop Metformin  2 days prior to surgery. Last dose Saturday September 08, 2019  Follow recommendations from Cardiologist, Pulmonologist or PCP regarding stopping Aspirin, Coumadin, Plavix, Eliquis, Pradaxa, or Pletal. Stop ASPIRIN for 7 DAYS BEFORE SURGERY  Stop Anti-inflammatories (NSAIDS) such as Advil, Aleve, Ibuprofen, Motrin, Naproxen, Naprosyn and Aspirin based products such as Excedrin, Goodys Powder, BC Powder. (May take Tylenol or Acetaminophen if needed.)  Stop ANY OVER THE COUNTER supplements until after surgery. (May continue Vitamin D, Vitamin B,  and multivitamin.)  No Alcohol for 24 hours before or after surgery.  No Smoking including e-cigarettes for 24 hours prior to surgery.  No chewable tobacco products for at least 6 hours prior to surgery.  No nicotine patches on the day of surgery.  Do not use any "recreational" drugs for at least a week prior to your surgery.  Please be advised that the combination of cocaine and anesthesia may have negative outcomes, up to and including death. If you test positive for cocaine, your surgery will be cancelled.  On the morning of surgery brush your teeth with toothpaste and water, you may rinse your mouth with mouthwash if you wish. Do not swallow any toothpaste or mouthwash.  Do not wear jewelry, make-up, hairpins, clips or nail polish.  Do not wear lotions, powders, or perfumes.   Do not shave 48 hours prior to surgery.   Contact lenses, hearing aids and dentures may not be worn into surgery.  Do not bring valuables to the hospital. [facility] is not responsible for any missing/lost belongings or valuables.   TAKE SHOWER DAY OF SURGERY  Notify your doctor if there is any change in your medical condition (cold, fever, infection).  Wear comfortable clothing (specific to your surgery type) to the hospital.  Plan for stool softeners for home use; pain medications have a tendency to cause constipation. You can also help prevent constipation by eating foods high in fiber such as fruits and vegetables and drinking plenty of fluids as your diet allows.  After surgery, you can help prevent lung  complications by doing breathing exercises.  Take deep breaths and cough every 1-2 hours. Your doctor may order a device called an Incentive Spirometer to help you take deep breaths. When coughing or sneezing, hold a pillow firmly against your incision with both hands. This is called splinting. Doing this helps protect your incision. It also decreases belly discomfort.   If you are being  discharged the day of surgery, you will not be allowed to drive home. You will need a responsible adult (18 years or older) to drive you home and stay with you that night.   Please call the [place] Dept. at [phone] if you have any questions about these instructions.  Visitation Policy:  Patients undergoing a surgery or procedure may have one family member or support person with them as long as that person is not COVID-19 positive or experiencing its symptoms.  That person may remain in the waiting area during the procedure.  Children under 39 years of age may have both parents or legal guardians with them during their procedure.  Inpatient Visitation Update:   Two designated support people may visit a patient during visiting hours 7 am to 8 pm. It must be the same two designated people for the duration of the patient stay. The visitors may come and go during the day, and there is no switching out to have different visitors. A mask must be worn at all times, including in the patient room.  Children under 33 years of age:  a total of 4 designated visitors for the childs entire stay are allowed. Only 2 in the room at a time and only one staying overnight at a time. The overnight guest can now rotate during the childs hospital stay.  As a reminder, masks are still required for all [place] team members, patients and visitors in all [place] facilities.   Systemwide, no visitors 17 or younger.

## 2019-09-07 ENCOUNTER — Other Ambulatory Visit
Admission: RE | Admit: 2019-09-07 | Discharge: 2019-09-07 | Disposition: A | Discharge status: Home / Self Care | Payer: Medicare HMO | Source: Ambulatory Visit | Attending: Urology | Admitting: Urology

## 2019-09-07 ENCOUNTER — Other Ambulatory Visit: Payer: Medicare HMO

## 2019-09-07 ENCOUNTER — Other Ambulatory Visit: Payer: Self-pay

## 2019-09-07 DIAGNOSIS — Z01812 Encounter for preprocedural laboratory examination: Secondary | ICD-10-CM | POA: Insufficient documentation

## 2019-09-07 DIAGNOSIS — Z20822 Contact with and (suspected) exposure to covid-19: Secondary | ICD-10-CM | POA: Insufficient documentation

## 2019-09-07 LAB — SARS CORONAVIRUS 2 (TAT 6-24 HRS): SARS Coronavirus 2: NEGATIVE

## 2019-09-09 ENCOUNTER — Telehealth: Payer: Self-pay | Admitting: Urology

## 2019-09-09 NOTE — Telephone Encounter (Signed)
Her surgery is on Tuesday and we need her labs repeated tomorrow.

## 2019-09-10 MED ORDER — CHLORHEXIDINE GLUCONATE 0.12 % MT SOLN: 15.0000 mL | Freq: Once | OROMUCOSAL | Status: AC

## 2019-09-10 MED ORDER — CEFAZOLIN SODIUM-DEXTROSE 2-4 GM/100ML-% IV SOLN
2.0000 g | INTRAVENOUS | Status: AC
  Administered 2019-09-11: 2 g via INTRAVENOUS

## 2019-09-10 MED ORDER — ORAL CARE MOUTH RINSE: 15.0000 mL | Freq: Once | OROMUCOSAL | Status: AC

## 2019-09-10 MED ORDER — SODIUM CHLORIDE 0.9 % IV SOLN: INTRAVENOUS | Status: DC

## 2019-09-10 NOTE — Telephone Encounter (Signed)
These were done at her PCP's office. They are in [place].

## 2019-09-11 ENCOUNTER — Ambulatory Visit
Admission: RE | Admit: 2019-09-11 | Discharge: 2019-09-11 | Disposition: A | Discharge status: Home / Self Care | Payer: Medicare HMO | Attending: Urology | Admitting: Urology

## 2019-09-11 ENCOUNTER — Encounter
Admission: RE | Disposition: A | Discharge status: Home / Self Care | Payer: Self-pay | Source: Home / Self Care | Attending: Urology

## 2019-09-11 ENCOUNTER — Ambulatory Visit: Payer: Medicare HMO | Admitting: Certified Registered"

## 2019-09-11 ENCOUNTER — Ambulatory Visit: Payer: Medicare HMO | Admitting: Urgent Care

## 2019-09-11 ENCOUNTER — Ambulatory Visit: Payer: Medicare HMO

## 2019-09-11 ENCOUNTER — Encounter: Payer: Self-pay | Admitting: Urology

## 2019-09-11 DIAGNOSIS — Z7984 Long term (current) use of oral hypoglycemic drugs: Secondary | ICD-10-CM | POA: Diagnosis not present

## 2019-09-11 DIAGNOSIS — N3941 Urge incontinence: Secondary | ICD-10-CM | POA: Diagnosis not present

## 2019-09-11 DIAGNOSIS — R350 Frequency of micturition: Secondary | ICD-10-CM | POA: Diagnosis not present

## 2019-09-11 DIAGNOSIS — E1122 Type 2 diabetes mellitus with diabetic chronic kidney disease: Secondary | ICD-10-CM | POA: Diagnosis not present

## 2019-09-11 DIAGNOSIS — Z880 Allergy status to penicillin: Secondary | ICD-10-CM | POA: Insufficient documentation

## 2019-09-11 DIAGNOSIS — N202 Calculus of kidney with calculus of ureter: Secondary | ICD-10-CM | POA: Diagnosis not present

## 2019-09-11 DIAGNOSIS — J45909 Unspecified asthma, uncomplicated: Secondary | ICD-10-CM | POA: Insufficient documentation

## 2019-09-11 DIAGNOSIS — F209 Schizophrenia, unspecified: Secondary | ICD-10-CM | POA: Insufficient documentation

## 2019-09-11 DIAGNOSIS — Z87891 Personal history of nicotine dependence: Secondary | ICD-10-CM | POA: Diagnosis not present

## 2019-09-11 DIAGNOSIS — N201 Calculus of ureter: Secondary | ICD-10-CM

## 2019-09-11 DIAGNOSIS — M1990 Unspecified osteoarthritis, unspecified site: Secondary | ICD-10-CM | POA: Diagnosis not present

## 2019-09-11 DIAGNOSIS — N189 Chronic kidney disease, unspecified: Secondary | ICD-10-CM | POA: Insufficient documentation

## 2019-09-11 DIAGNOSIS — Z885 Allergy status to narcotic agent status: Secondary | ICD-10-CM | POA: Insufficient documentation

## 2019-09-11 DIAGNOSIS — Z6839 Body mass index (BMI) 39.0-39.9, adult: Secondary | ICD-10-CM | POA: Diagnosis not present

## 2019-09-11 DIAGNOSIS — K589 Irritable bowel syndrome without diarrhea: Secondary | ICD-10-CM | POA: Diagnosis not present

## 2019-09-11 DIAGNOSIS — R519 Headache, unspecified: Secondary | ICD-10-CM | POA: Insufficient documentation

## 2019-09-11 DIAGNOSIS — G8929 Other chronic pain: Secondary | ICD-10-CM | POA: Insufficient documentation

## 2019-09-11 DIAGNOSIS — I129 Hypertensive chronic kidney disease with stage 1 through stage 4 chronic kidney disease, or unspecified chronic kidney disease: Secondary | ICD-10-CM | POA: Diagnosis not present

## 2019-09-11 DIAGNOSIS — Z811 Family history of alcohol abuse and dependence: Secondary | ICD-10-CM | POA: Insufficient documentation

## 2019-09-11 DIAGNOSIS — E7800 Pure hypercholesterolemia, unspecified: Secondary | ICD-10-CM | POA: Diagnosis not present

## 2019-09-11 DIAGNOSIS — G709 Myoneural disorder, unspecified: Secondary | ICD-10-CM | POA: Diagnosis not present

## 2019-09-11 DIAGNOSIS — N182 Chronic kidney disease, stage 2 (mild): Secondary | ICD-10-CM | POA: Diagnosis not present

## 2019-09-11 DIAGNOSIS — Z809 Family history of malignant neoplasm, unspecified: Secondary | ICD-10-CM | POA: Insufficient documentation

## 2019-09-11 DIAGNOSIS — M1611 Unilateral primary osteoarthritis, right hip: Secondary | ICD-10-CM | POA: Insufficient documentation

## 2019-09-11 DIAGNOSIS — F419 Anxiety disorder, unspecified: Secondary | ICD-10-CM | POA: Diagnosis not present

## 2019-09-11 DIAGNOSIS — E669 Obesity, unspecified: Secondary | ICD-10-CM | POA: Insufficient documentation

## 2019-09-11 DIAGNOSIS — G20 Parkinson's disease: Secondary | ICD-10-CM | POA: Diagnosis not present

## 2019-09-11 DIAGNOSIS — N200 Calculus of kidney: Secondary | ICD-10-CM | POA: Diagnosis present

## 2019-09-11 DIAGNOSIS — F329 Major depressive disorder, single episode, unspecified: Secondary | ICD-10-CM | POA: Diagnosis not present

## 2019-09-11 DIAGNOSIS — Z90710 Acquired absence of both cervix and uterus: Secondary | ICD-10-CM | POA: Diagnosis not present

## 2019-09-11 DIAGNOSIS — Z96652 Presence of left artificial knee joint: Secondary | ICD-10-CM | POA: Insufficient documentation

## 2019-09-11 DIAGNOSIS — Z886 Allergy status to analgesic agent status: Secondary | ICD-10-CM | POA: Insufficient documentation

## 2019-09-11 DIAGNOSIS — K219 Gastro-esophageal reflux disease without esophagitis: Secondary | ICD-10-CM | POA: Insufficient documentation

## 2019-09-11 DIAGNOSIS — Z888 Allergy status to other drugs, medicaments and biological substances status: Secondary | ICD-10-CM | POA: Insufficient documentation

## 2019-09-11 DIAGNOSIS — Z8249 Family history of ischemic heart disease and other diseases of the circulatory system: Secondary | ICD-10-CM | POA: Insufficient documentation

## 2019-09-11 HISTORY — PX: CYSTOSCOPY/URETEROSCOPY/HOLMIUM LASER/STENT PLACEMENT: SHX6546

## 2019-09-11 LAB — GLUCOSE, CAPILLARY
Glucose-Capillary: 220 mg/dL — ABNORMAL HIGH (ref 70–99)
Glucose-Capillary: 220 mg/dL — ABNORMAL HIGH (ref 70–99)
Glucose-Capillary: 207 mg/dL — ABNORMAL HIGH (ref 70–99)

## 2019-09-11 SURGERY — CYSTOSCOPY/URETEROSCOPY/HOLMIUM LASER/STENT PLACEMENT
Anesthesia: General | Laterality: Left

## 2019-09-11 MED ORDER — ROCURONIUM BROMIDE 100 MG/10ML IV SOLN
INTRAVENOUS | Status: DC | PRN
  Administered 2019-09-11: 60 mg via INTRAVENOUS

## 2019-09-11 MED ORDER — IOHEXOL 180 MG/ML  SOLN
INTRAMUSCULAR | Status: DC | PRN
Start: 2019-09-11 — End: 2019-09-11
  Administered 2019-09-11: 20 mL

## 2019-09-11 MED ORDER — PHENYLEPHRINE HCL (PRESSORS) 10 MG/ML IV SOLN
INTRAVENOUS | Status: DC | PRN
  Administered 2019-09-11 (×2): 200 ug via INTRAVENOUS

## 2019-09-11 MED ORDER — FENTANYL CITRATE (PF) 100 MCG/2ML IJ SOLN
INTRAMUSCULAR | Status: DC | PRN
  Administered 2019-09-11: 100 ug via INTRAVENOUS

## 2019-09-11 MED ORDER — CHLORHEXIDINE GLUCONATE 0.12 % MT SOLN
OROMUCOSAL | Status: AC
  Administered 2019-09-11: 15 mL via OROMUCOSAL
  Filled 2019-09-11: qty 15

## 2019-09-11 MED ORDER — ROCURONIUM BROMIDE 10 MG/ML (PF) SYRINGE
PREFILLED_SYRINGE | INTRAVENOUS | Status: AC
  Filled 2019-09-11: qty 10

## 2019-09-11 MED ORDER — FENTANYL CITRATE (PF) 100 MCG/2ML IJ SOLN: 25.0000 ug | INTRAMUSCULAR | Status: DC | PRN

## 2019-09-11 MED ORDER — DEXAMETHASONE SODIUM PHOSPHATE 10 MG/ML IJ SOLN
INTRAMUSCULAR | Status: AC
  Filled 2019-09-11: qty 1

## 2019-09-11 MED ORDER — MIDAZOLAM HCL 2 MG/2ML IJ SOLN
INTRAMUSCULAR | Status: DC | PRN
  Administered 2019-09-11: 2 mg via INTRAVENOUS

## 2019-09-11 MED ORDER — SEVOFLURANE IN SOLN
RESPIRATORY_TRACT | Status: AC
  Filled 2019-09-11: qty 500

## 2019-09-11 MED ORDER — ONDANSETRON HCL 4 MG/2ML IJ SOLN
INTRAMUSCULAR | Status: DC | PRN
  Administered 2019-09-11: 4 mg via INTRAVENOUS

## 2019-09-11 MED ORDER — OXYBUTYNIN CHLORIDE 5 MG PO TABS
ORAL_TABLET | ORAL | 0 refills | Status: DC
Start: 2019-09-11 — End: 2020-08-19

## 2019-09-11 MED ORDER — CEFAZOLIN SODIUM-DEXTROSE 2-4 GM/100ML-% IV SOLN
INTRAVENOUS | Status: AC
  Filled 2019-09-11: qty 100

## 2019-09-11 MED ORDER — FAMOTIDINE 20 MG PO TABS
ORAL_TABLET | ORAL | Status: AC
  Administered 2019-09-11: 20 mg via ORAL
  Filled 2019-09-11: qty 1

## 2019-09-11 MED ORDER — PHENYLEPHRINE HCL (PRESSORS) 10 MG/ML IV SOLN
INTRAVENOUS | Status: AC
  Filled 2019-09-11: qty 1

## 2019-09-11 MED ORDER — LIDOCAINE HCL (PF) 2 % IJ SOLN
INTRAMUSCULAR | Status: AC
  Filled 2019-09-11: qty 5

## 2019-09-11 MED ORDER — FAMOTIDINE 20 MG PO TABS: 20.0000 mg | ORAL_TABLET | Freq: Once | ORAL | Status: AC

## 2019-09-11 MED ORDER — FENTANYL CITRATE (PF) 100 MCG/2ML IJ SOLN
INTRAMUSCULAR | Status: AC
  Filled 2019-09-11: qty 2

## 2019-09-11 MED ORDER — PROPOFOL 10 MG/ML IV BOLUS
INTRAVENOUS | Status: AC
  Filled 2019-09-11: qty 20

## 2019-09-11 MED ORDER — DEXAMETHASONE SODIUM PHOSPHATE 10 MG/ML IJ SOLN
INTRAMUSCULAR | Status: DC | PRN
  Administered 2019-09-11: 10 mg via INTRAVENOUS

## 2019-09-11 MED ORDER — LIDOCAINE HCL (CARDIAC) PF 100 MG/5ML IV SOSY
PREFILLED_SYRINGE | INTRAVENOUS | Status: DC | PRN
  Administered 2019-09-11: 100 mg via INTRAVENOUS

## 2019-09-11 MED ORDER — MIDAZOLAM HCL 2 MG/2ML IJ SOLN
INTRAMUSCULAR | Status: AC
  Filled 2019-09-11: qty 2

## 2019-09-11 MED ORDER — ONDANSETRON HCL 4 MG/2ML IJ SOLN
INTRAMUSCULAR | Status: AC
  Filled 2019-09-11: qty 2

## 2019-09-11 MED ORDER — PROPOFOL 10 MG/ML IV BOLUS
INTRAVENOUS | Status: DC | PRN
  Administered 2019-09-11: 150 mg via INTRAVENOUS

## 2019-09-11 MED ORDER — ONDANSETRON HCL 4 MG/2ML IJ SOLN: 4.0000 mg | Freq: Once | INTRAMUSCULAR | Status: DC | PRN

## 2019-09-11 MED ORDER — SUGAMMADEX SODIUM 200 MG/2ML IV SOLN
INTRAVENOUS | Status: DC | PRN
  Administered 2019-09-11: 200 mg via INTRAVENOUS

## 2019-09-11 SURGICAL SUPPLY — 33 items
BAG DRAIN CYSTO-URO LG1000N (MISCELLANEOUS) ×2 IMPLANT
BASKET ZERO TIP 1.9FR (BASKET) ×1 IMPLANT
BRUSH SCRUB EZ 1% IODOPHOR (MISCELLANEOUS) ×2 IMPLANT
BSKT STON RTRVL ZERO TP 1.9FR (BASKET) ×1
CATH URETL 5X70 OPEN END (CATHETERS) IMPLANT
CNTNR SPEC 2.5X3XGRAD LEK (MISCELLANEOUS)
CONT SPEC 4OZ STER OR WHT (MISCELLANEOUS)
CONT SPEC 4OZ STRL OR WHT (MISCELLANEOUS)
CONTAINER SPEC 2.5X3XGRAD LEK (MISCELLANEOUS) IMPLANT
DRAPE UTILITY 15X26 TOWEL STRL (DRAPES) ×2 IMPLANT
FIBER LASER TRACTIP 200 (UROLOGICAL SUPPLIES) ×2 IMPLANT
GLOVE BIOGEL PI IND STRL 7.5 (GLOVE) ×1 IMPLANT
GLOVE BIOGEL PI INDICATOR 7.5 (GLOVE) ×1
GOWN STRL REUS W/ TWL LRG LVL3 (GOWN DISPOSABLE) ×1 IMPLANT
GOWN STRL REUS W/ TWL XL LVL3 (GOWN DISPOSABLE) ×1 IMPLANT
GOWN STRL REUS W/TWL LRG LVL3 (GOWN DISPOSABLE) ×2
GOWN STRL REUS W/TWL XL LVL3 (GOWN DISPOSABLE) ×2
GUIDEWIRE GREEN .038 145CM (MISCELLANEOUS) ×1 IMPLANT
GUIDEWIRE STR DUAL SENSOR (WIRE) ×2 IMPLANT
INFUSOR MANOMETER BAG 3000ML (MISCELLANEOUS) ×2 IMPLANT
INTRODUCER DILATOR DOUBLE (INTRODUCER) ×1 IMPLANT
KIT TURNOVER CYSTO (KITS) ×2 IMPLANT
PACK CYSTO AR (MISCELLANEOUS) ×2 IMPLANT
SET CYSTO W/LG BORE CLAMP LF (SET/KITS/TRAYS/PACK) ×2 IMPLANT
SHEATH URETERAL 12FRX35CM (MISCELLANEOUS) IMPLANT
SOL .9 NS 3000ML IRR  AL (IV SOLUTION) ×2
SOL .9 NS 3000ML IRR AL (IV SOLUTION) ×1
SOL .9 NS 3000ML IRR UROMATIC (IV SOLUTION) ×1 IMPLANT
STENT URET 6FRX24 CONTOUR (STENTS) IMPLANT
STENT URET 6FRX26 CONTOUR (STENTS) IMPLANT
SURGILUBE 2OZ TUBE FLIPTOP (MISCELLANEOUS) ×2 IMPLANT
VALVE UROSEAL ADJ ENDO (VALVE) IMPLANT
WATER STERILE IRR 1000ML POUR (IV SOLUTION) ×2 IMPLANT

## 2019-09-11 NOTE — Anesthesia Procedure Notes (Signed)
Procedure Name: Intubation Performed by: Gaynelle Cage, CRNA Pre-anesthesia Checklist: Patient identified, Emergency Drugs available, Suction available and Patient being monitored Patient Re-evaluated:Patient Re-evaluated prior to induction Oxygen Delivery Method: Circle system utilized Preoxygenation: Pre-oxygenation with 100% oxygen Induction Type: IV induction Ventilation: Mask ventilation without difficulty and Oral airway inserted - appropriate to patient size Laryngoscope Size: 3 and Mac Grade View: Grade II Tube type: Oral Tube size: 7.0 mm Number of attempts: 1 Airway Equipment and Method: Oral airway Placement Confirmation: ETT inserted through vocal cords under direct vision,  positive ETCO2 and breath sounds checked- equal and bilateral Secured at: 21 cm Tube secured with: Tape Dental Injury: Teeth and Oropharynx as per pre-operative assessment

## 2019-09-11 NOTE — Transfer of Care (Signed)
Immediate Anesthesia Transfer of Care Note  Patient: Heather Haley  Procedure(s) Performed: CYSTOSCOPY/URETEROSCOPY/HOLMIUM LASER/STENT PLACEMENT (Left )  Patient Location: PACU  Anesthesia Type:General  Level of Consciousness: awake and patient cooperative  Airway & Oxygen Therapy: Patient Spontanous Breathing and Patient connected to face mask oxygen  Post-op Assessment: Report given to RN and Post -op Vital signs reviewed and stable  Post vital signs: Reviewed and stable  Last Vitals:  Vitals Value Taken Time  BP 171/102 09/11/19 0846  Temp    Pulse 93 09/11/19 0847  Resp 18 09/11/19 0847  SpO2 97 % 09/11/19 0847  Vitals shown include unvalidated device data.  Last Pain:  Vitals:   09/11/19 0631  TempSrc: Temporal  PainSc: 5          Complications: No complications documented.

## 2019-09-11 NOTE — Discharge Instructions (Addendum)
DISCHARGE INSTRUCTIONS FOR KIDNEY STONE/URETERAL STENT   MEDICATIONS:  1. Resume all your other meds from home.  2.  AZO (over-the-counter) can help with the burning/stinging when you urinate. 3.  Oxybutynin is for bladder/stent irritation, Rx was sent to your pharmacy.  ACTIVITY:  1. May resume regular activities in 24 hours. 2. No driving while on narcotic pain medications  3. Drink plenty of water  4. Continue to walk at home - you can still get blood clots when you are at home, so keep active, but don't over do it.  5. May return to work/school tomorrow or when you feel ready   BATHING:  1. You can shower or bathe.   SIGNS/SYMPTOMS TO CALL:  Please call us if you have a fever greater than 101.5, uncontrolled nausea/vomiting, uncontrolled pain, dizziness, unable to urinate, excessively bloody urine, chest pain, shortness of breath, leg swelling, leg pain, or any other concerns or questions.   Common postop symptoms include urinary frequency, urgency, bladder spasm, flank pain and blood in urine  You can reach [place] at [phone].   FOLLOW-UP:  1. You will be scheduled for an office visit in 1 week for stent removal   AMBULATORY SURGERY  DISCHARGE INSTRUCTIONS   1) The drugs that you were given will stay in your system until tomorrow so for the next 24 hours you should not:  A) Drive an automobile B) Make any legal decisions C) Drink any alcoholic beverage   2) You may resume regular meals tomorrow.  Today it is better to start with liquids and gradually work up to solid foods.  You may eat anything you prefer, but it is better to start with liquids, then soup and crackers, and gradually work up to solid foods.   3) Please notify your doctor immediately if you have any unusual bleeding, trouble breathing, redness and pain at the surgery site, drainage, fever, or pain not relieved by medication.    4) Additional Instructions:        Please contact your  physician with any problems or Same Day Surgery at [phone], Monday through Friday 6 am to 4 pm, or [place] at [facility] number at [phone].

## 2019-09-11 NOTE — Op Note (Signed)
Preoperative diagnosis:  1. Left distal ureteral calculus 2. Left nephrolithiasis  Postoperative diagnosis:  Same  Procedure:  Cystoscopy Left ureteroscopy and stone removal Ureteroscopic laser lithotripsy Left ureteral stent placement 6FR/24 cm Left retrograde pyelography with interpretation  Surgeon: Nicki Reaper C. Velena Keegan, M.D.  Anesthesia: General  Complications: None  Intraoperative findings:  1.  Left retrograde pyelography post procedure showed no filling defects, stone fragments or contrast extravasation  EBL: Minimal  Specimens: Calculus fragments for analysis   Indication: Heather Haley is a 59 y.o. year old patient with a 3 X 7 mm left distal ureteral calculus.  She also has nonobstructing small left renal calculi after reviewing the management options for treatment, the patient elected to proceed with the above surgical procedure(s). We have discussed the potential benefits and risks of the procedure, side effects of the proposed treatment, the likelihood of the patient achieving the goals of the procedure, and any potential problems that might occur during the procedure or recuperation. Informed consent has been obtained.  Description of procedure:  The patient was taken to the operating room and general anesthesia was induced.  The patient was placed in the dorsal lithotomy position, prepped and draped in the usual sterile fashion, and preoperative antibiotics were administered. A preoperative time-out was performed.   A 21 French cystoscope was lubricated and passed per urethra.  Panendoscopy was performed and the bladder mucosa showed no erythema, solid or papillary lesions.  Attention was directed to the left ureteral orifice and a 0.038 Sensor wire was advanced through the cystoscope into the left ureteral orifice however was unable to be negotiated passed the calculus under fluoroscopic guidance.  The cystoscope was removed and a 4.5 French semirigid  ureteroscope was passed per urethra and into the left distal ureter up to the level of the stone.  A 0.038 Sensor wire was then advanced passed under direct vision.  The ureteroscope was removed and then repassed up to the level of the stone.  The stone was then fragmented with a 200 micron holmium laser fiber on a setting of 0.8 J and frequency of 8 hz.   All fragments were then removed from the ureter with a zero tip nitinol basket.  Reinspection of the ureter revealed no remaining visible stones or fragments.   A single channel digital flexible ureteroscope was then passed per urethra and into the left ureter orifice.  Approximately 3 cm within the distal ureter the ureteroscope would not advance.  A Amplatz superstiff wire was advanced to the ureteroscope and the ureteroscope would not advance over the guidewire.  The ureteroscope was removed and the inner stylette of a 12/14 French ureteral access catheter was advanced over the wire under fluoroscopic guidance without difficulty.  The access sheath was then gently advanced over the guidewire without difficulty.  The flexible ureteroscope was then advanced through the access sheath and into the renal pelvis.  Pyeloscopy was performed examining all calyces and no calculi or identified.  There were Randall's plaques and several calyces.  Retrograde pyelogram was then performed and all calyces were then examined and no calculi identified.  There was no extravasation noted.  The Amplatz sheath was removed under direct vision and no ureteral abnormalities were noted.  A 6 FR/24 CM Contour stent was placed under fluoroscopic guidance.  The wire was then removed with an adequate stent curl noted in the renal pelvis as well as in the bladder.  The bladder was then emptied and the procedure ended.  The  patient appeared to tolerate the procedure well and without complications.  After anesthetic reversal the patient was transported to the PACU in stable  condition.   Plan: Office follow-up 1 week for cystoscopy with stent removal   John Giovanni, MD

## 2019-09-11 NOTE — Anesthesia Preprocedure Evaluation (Signed)
Anesthesia Evaluation  Patient identified by MRN, date of birth, ID band Patient awake    Reviewed: Allergy & Precautions, NPO status , Patient's Chart, lab work & pertinent test results  History of Anesthesia Complications (+) PONV and history of anesthetic complications  Airway Mallampati: III  TM Distance: >3 FB Neck ROM: Full    Dental  (+) Poor Dentition   Pulmonary neg sleep apnea, neg COPD, former smoker,    breath sounds clear to auscultation- rhonchi (-) wheezing      Cardiovascular hypertension, Pt. on medications (-) CAD, (-) Past MI, (-) Cardiac Stents and (-) CABG  Rhythm:Regular Rate:Normal - Systolic murmurs and - Diastolic murmurs    Neuro/Psych  Headaches, PSYCHIATRIC DISORDERS Anxiety Depression Schizophrenia  Neuromuscular disease    GI/Hepatic Neg liver ROS, GERD  ,  Endo/Other  diabetes, Oral Hypoglycemic Agents  Renal/GU Renal InsufficiencyRenal disease     Musculoskeletal  (+) Arthritis , Osteoarthritis,    Abdominal (+) + obese,   Peds  Hematology negative hematology ROS (+)   Anesthesia Other Findings Past Medical History: No date: Anxiety No date: Chronic kidney disease No date: Chronic pain     Comment:  previously saw Dr. Consuela Mimes in pain clinic, then saw pain              specialist in [facility] No date: Depression No date: Diabetes mellitus ([place]) No date: Frequency of urination No date: GERD (gastroesophageal reflux disease) No date: Headache(784.0) No date: High cholesterol No date: Hypertension No date: IBS (irritable bowel syndrome) No date: Left ankle instability No date: Left knee DJD 10/11/2014: Lumbar Degenerative Disc Disease of  No date: Neuromuscular disorder ([place]) 05/05/2015: Osteoarthritis of hip (Right) 12/15/2009: Other enthesopathy of ankle and tarsus     Comment:  Qualifier: Diagnosis of  By: FIELDS MD, KARL   11/19/2014: Peripheral sensory neuropathy  (Bilateral) No date: Postoperative nausea and vomiting No date: Schizophrenia ([place])   Reproductive/Obstetrics                             Anesthesia Physical  Anesthesia Plan  ASA: III  Anesthesia Plan: General   Post-op Pain Management:    Induction: Intravenous  PONV Risk Score and Plan: 3 and Propofol infusion  Airway Management Planned: Oral ETT  Additional Equipment:   Intra-op Plan:   Post-operative Plan: Extubation in OR  Informed Consent: I have reviewed the patients History and Physical, chart, labs and discussed the procedure including the risks, benefits and alternatives for the proposed anesthesia with the patient or authorized representative who has indicated his/her understanding and acceptance.     Dental advisory given  Plan Discussed with: CRNA and Anesthesiologist  Anesthesia Plan Comments:         Anesthesia Quick Evaluation

## 2019-09-11 NOTE — Interval H&P Note (Signed)
History and Physical Interval Note: CV:RRR Lungs:clear  09/11/2019 7:16 AM  Heather Haley  has presented today for surgery, with the diagnosis of left ureteral stone.  The various methods of treatment have been discussed with the patient and family. After consideration of risks, benefits and other options for treatment, the patient has consented to  Procedure(s): CYSTOSCOPY/URETEROSCOPY/HOLMIUM LASER/STENT PLACEMENT (Left) as a surgical intervention.  The patient's history has been reviewed, patient examined, no change in status, stable for surgery.  I have reviewed the patient's chart and labs.  Questions were answered to the patient's satisfaction.     [name]

## 2019-09-12 ENCOUNTER — Encounter: Payer: Self-pay | Admitting: Urology

## 2019-09-12 NOTE — Anesthesia Postprocedure Evaluation (Signed)
Anesthesia Post Note  Patient: Heather Haley  Procedure(s) Performed: CYSTOSCOPY/URETEROSCOPY/HOLMIUM LASER/STENT PLACEMENT (Left )  Patient location during evaluation: PACU Anesthesia Type: General Level of consciousness: awake and alert and oriented Pain management: pain level controlled Vital Signs Assessment: post-procedure vital signs reviewed and stable Respiratory status: spontaneous breathing Cardiovascular status: blood pressure returned to baseline Anesthetic complications: no   No complications documented.   Last Vitals:  Vitals:   09/11/19 0927 09/11/19 0935  BP: 138/73 (!) 179/82  Pulse:  88  Resp:  18  Temp:  36.4 C  SpO2: 94% 95%    Last Pain:  Vitals:   09/11/19 0935  TempSrc: Temporal  PainSc: 0-No pain                 Marijo Quizon

## 2019-09-13 ENCOUNTER — Telehealth: Payer: Self-pay

## 2019-09-13 NOTE — Telephone Encounter (Signed)
Patient called stating that she is having urgency, frequency and dysuria with urination. It was explained that with a stent in place these symptoms are normal. She was encouraged to utilize the oxybutinin sent into her pharmacy to help with stent discomfort and bladder spasms. Patient states she had not yet filled this script and would now. She will call back if symptoms do not get better with oxybutinin

## 2019-09-14 DIAGNOSIS — I10 Essential (primary) hypertension: Secondary | ICD-10-CM | POA: Diagnosis not present

## 2019-09-14 DIAGNOSIS — Z79899 Other long term (current) drug therapy: Secondary | ICD-10-CM | POA: Diagnosis not present

## 2019-09-14 DIAGNOSIS — G20 Parkinson's disease: Secondary | ICD-10-CM | POA: Diagnosis not present

## 2019-09-14 DIAGNOSIS — F259 Schizoaffective disorder, unspecified: Secondary | ICD-10-CM | POA: Diagnosis not present

## 2019-09-14 DIAGNOSIS — E1165 Type 2 diabetes mellitus with hyperglycemia: Secondary | ICD-10-CM | POA: Diagnosis not present

## 2019-09-14 DIAGNOSIS — E785 Hyperlipidemia, unspecified: Secondary | ICD-10-CM | POA: Diagnosis not present

## 2019-09-14 DIAGNOSIS — Z794 Long term (current) use of insulin: Secondary | ICD-10-CM | POA: Diagnosis not present

## 2019-09-17 ENCOUNTER — Telehealth: Payer: Self-pay

## 2019-09-17 NOTE — Telephone Encounter (Signed)
Patient left a vmail in regards to bladder spasms, patient did not answer left vmail to return call

## 2019-09-17 NOTE — Telephone Encounter (Signed)
She can pick up Myrbetriq samples 50 mg.  Scheduled for stent removal 8/11

## 2019-09-17 NOTE — Telephone Encounter (Signed)
Patient called stating that her urgency and frequency and bladder spasm discomfort is not better with the oxybutinin. Can another medication be sent in? Patient also states she is having some burning with urination. She was also encouraged to try AZO OTC and increase water intake

## 2019-09-18 NOTE — Telephone Encounter (Signed)
Notified patient as instructed, patient pleased. Discussed follow-up appointments, patient agrees  

## 2019-09-19 ENCOUNTER — Ambulatory Visit (INDEPENDENT_AMBULATORY_CARE_PROVIDER_SITE_OTHER): Payer: Medicare HMO | Admitting: Urology

## 2019-09-19 ENCOUNTER — Other Ambulatory Visit: Payer: Self-pay

## 2019-09-19 ENCOUNTER — Encounter: Payer: Self-pay | Admitting: Urology

## 2019-09-19 VITALS — BP 124/80 | HR 109 | Ht 65.0 in | Wt 235.0 lb

## 2019-09-19 DIAGNOSIS — N201 Calculus of ureter: Secondary | ICD-10-CM

## 2019-09-19 LAB — CALCULI, WITH PHOTOGRAPH (CLINICAL LAB)
Calcium Oxalate Dihydrate: 10 %
Calcium Oxalate Monohydrate: 70 %
Hydroxyapatite: 20 %
Weight Calculi: 16 mg

## 2019-09-19 NOTE — Progress Notes (Signed)
Indications: Patient is 59 y.o., female who recently underwent ureteroscopic removal of a left distal ureteral calculus. She had no postoperative problems. The patient is presenting today for stent removal.  Procedure:  Flexible Cystoscopy with stent removal (00459)  Timeout was performed and the correct patient, procedure and participants were identified.    Description:  The patient was prepped and draped in the usual sterile fashion. Flexible cystosopy was performed.  The stent was visualized, grasped, and removed intact without difficulty. The patient tolerated the procedure well.  A single dose of oral antibiotics was given.  Complications:  None  Plan:   Instructed to call for flank pain or fever post stent removal  Stone analysis pending; PA follow-up 1 month to discuss stone analysis and metabolic evaluation

## 2019-09-21 LAB — URINALYSIS, COMPLETE
Bilirubin, UA: NEGATIVE
Glucose, UA: NEGATIVE
Nitrite, UA: NEGATIVE
Specific Gravity, UA: >1.03 — ABNORMAL HIGH (ref 1.005–1.030)
Urobilinogen, Ur: 0.2 mg/dL (ref 0.2–1.0)
pH, UA: 5.5 (ref 5.0–7.5)

## 2019-09-21 LAB — MICROSCOPIC EXAMINATION

## 2019-09-23 ENCOUNTER — Encounter: Payer: Self-pay | Admitting: Urology

## 2019-09-28 DIAGNOSIS — E538 Deficiency of other specified B group vitamins: Secondary | ICD-10-CM | POA: Diagnosis not present

## 2019-10-19 ENCOUNTER — Encounter: Payer: Self-pay | Admitting: Psychiatry

## 2019-10-19 ENCOUNTER — Other Ambulatory Visit: Payer: Self-pay

## 2019-10-19 ENCOUNTER — Telehealth (INDEPENDENT_AMBULATORY_CARE_PROVIDER_SITE_OTHER): Payer: Medicare HMO | Admitting: Psychiatry

## 2019-10-19 DIAGNOSIS — F411 Generalized anxiety disorder: Secondary | ICD-10-CM | POA: Diagnosis not present

## 2019-10-19 DIAGNOSIS — F251 Schizoaffective disorder, depressive type: Secondary | ICD-10-CM

## 2019-10-19 DIAGNOSIS — G2119 Other drug induced secondary parkinsonism: Secondary | ICD-10-CM

## 2019-10-19 DIAGNOSIS — F4310 Post-traumatic stress disorder, unspecified: Secondary | ICD-10-CM

## 2019-10-19 DIAGNOSIS — F5105 Insomnia due to other mental disorder: Secondary | ICD-10-CM

## 2019-10-19 MED ORDER — SERTRALINE HCL 100 MG PO TABS: 200.0000 mg | ORAL_TABLET | Freq: Every day | ORAL | 0 refills | Status: DC

## 2019-10-19 MED ORDER — AMANTADINE HCL 100 MG PO CAPS: 100.0000 mg | ORAL_CAPSULE | Freq: Every day | ORAL | 2 refills | Status: DC

## 2019-10-19 NOTE — Progress Notes (Signed)
Provider Location : [facility] Patient Location : Home  Participants: Patient , Provider  Virtual Visit via Telephone Note  I connected with Heather Haley on 10/19/19 at  9:40 AM EDT by telephone and verified that I am speaking with the correct person using two identifiers.   I discussed the limitations, risks, security and privacy concerns of performing an evaluation and management service by telephone and the availability of in person appointments. I also discussed with the patient that there may be a patient responsible charge related to this service. The patient expressed understanding and agreed to proceed.    I discussed the assessment and treatment plan with the patient. The patient was provided an opportunity to ask questions and all were answered. The patient agreed with the plan and demonstrated an understanding of the instructions.   The patient was advised to call back or seek an in-person evaluation if the symptoms worsen or if the condition fails to improve as anticipated.   [facility] MD/PA/NP OP Progress Note  10/19/2019 11:58 AM LEKIA NIER  MRN:  [id number]  Chief Complaint:  Chief Complaint    Follow-up     HPI: Heather Haley is a 59 year old Caucasian female, lives in [place], has a history of schizoaffective disorder, PTSD, GAD, drug-induced Parkinson's disease, migraine headaches, memory problems, diabetes melitis, chronic pain was evaluated by phone today.  Patient today reports she is currently struggling with anxiety , she feels nervous often.  The Zoloft does help to some extent.  She does continue to have auditory hallucinations however overall she is able to cope with it and it does not bother her much.  She denies any paranoia, visual hallucinations.  She is aware about the side effects of amantadine, including worsening psychosis.  She would like to reduce the dosage of amantadine if possible.  Her tremors are more manageable.  She currently  takes a low dosage of olanzapine 2.5 mg at bedtime.  She does report uncontrolled diabetes.  She reports she is currently going to an endocrinologist and is currently on new medications for her diabetes.  She wants to currently stay on the low dosage of olanzapine for her schizoaffective symptoms.  Patient denies any suicidality or homicidality.  Patient denies any other concerns today.   Visit Diagnosis:   ICD-10-CM   1. Schizoaffective disorder, depressive type ([place])  F25.1 sertraline (ZOLOFT) 100 MG tablet  2. PTSD (post-traumatic stress disorder)  F43.10 sertraline (ZOLOFT) 100 MG tablet  3. Generalized anxiety disorder  F41.1 sertraline (ZOLOFT) 100 MG tablet  4. Insomnia due to mental disorder  F51.05   5. Drug-induced Parkinson's disease ([place])  G21.19 amantadine (SYMMETREL) 100 MG capsule   stable    Past Psychiatric History: I have reviewed past psychiatric history from my progress note on 05/25/2017.  Past Medical History:  Past Medical History:  Diagnosis Date  . Anxiety   . Asthma   . Chronic kidney disease   . Chronic pain    previously saw Dr. Consuela Mimes in pain clinic, then saw pain specialist in [place]  . Depression   . Diabetes mellitus ([place])   . Frequency of urination   . GERD (gastroesophageal reflux disease)   . Headache(784.0)   . High cholesterol   . History of kidney stones   . Hypertension   . IBS (irritable bowel syndrome)   . Left ankle instability   . Left knee DJD   . Lumbar Degenerative Disc Disease of  10/11/2014  . Neuromuscular disorder (  HCC)   . Osteoarthritis of hip (Right) 05/05/2015  . Other enthesopathy of ankle and tarsus 12/15/2009   Qualifier: Diagnosis of  By: FIELDS MD, KARL    . Parkinson's disease (HCC)   . Peripheral sensory neuropathy (Bilateral) 11/19/2014  . Postoperative nausea and vomiting   . Schizophrenia (HCC)     Past Surgical History:  Procedure Laterality Date  . ABDOMINAL HYSTERECTOMY    . ANKLE SURGERY Left    x  2  . APPENDECTOMY    . CARDIAC CATHETERIZATION    . COLONOSCOPY  2013  . COLONOSCOPY WITH PROPOFOL N/A 04/25/2017   Procedure: COLONOSCOPY WITH PROPOFOL;  Surgeon: Elliott, Robert T, MD;  Location: [facility] END[facility]COPY;  Service: Endoscopy;  Laterality: N/A;  . CYSTOSCOPY/URETEROSCOPY/HOLMIUM LASER/STENT PLACEMENT Left 09/11/2019   Procedure: CYSTOSCOPY/URETEROSCOPY/HOLMIUM LASER/STENT PLACEMENT;  Surgeon: Stoioff, Scott C, MD;  Location: [facility] ORS;  Service: Urology;  Laterality: Left;  . ESOPHAGOGASTRODUODENOSCOPY (EGD) WITH PROPOFOL N/A 10/03/2014   Procedure: ESOPHAGOGASTRODUODENOSCOPY (EGD) WITH PROPOFOL;  Surgeon: Matthew Gordon Rein, MD;  Location: [facility] ENDOSCOPY;  Service: Endoscopy;  Laterality: N/A;  . ESOPHAGOGASTRODUODENOSCOPY (EGD) WITH PROPOFOL  04/25/2017   Procedure: ESOPHAGOGASTRODUODENOSCOPY (EGD) WITH PROPOFOL;  Surgeon: Elliott, Robert T, MD;  Location: [facility] ENDOSCOPY;  Service: Endoscopy;;  . JOINT REPLACEMENT Left    knee  . KNEE ARTHROSCOPY  1997   left knee  . LEFT HEART CATH AND CORONARY ANGIOGRAPHY Left 11/10/2017   Procedure: LEFT HEART CATH AND CORONARY ANGIOGRAPHY;  Surgeon: Paraschos, Alexander, MD;  Location: [facility] INVASIVE [facility];  Service: Cardiovascular;  Laterality: Left;  . LEFT HEART CATHETERIZATION WITH CORONARY ANGIOGRAM N/A 01/11/2013   Procedure: LEFT HEART CATHETERIZATION WITH CORONARY ANGIOGRAM;  Surgeon: Henry W Smith III, MD;  Location: [facility];  Service: Cardiovascular;  Laterality: N/A;  . TOTAL KNEE ARTHROPLASTY  08/30/2011   Procedure: TOTAL KNEE ARTHROPLASTY;  Surgeon: Robert A Wainer, MD;  Location: [facility] OR;  Service: Orthopedics;  Laterality: Left;    Family Psychiatric History: I have reviewed family psychiatric history from my progress note on 05/25/2017  Family History:  Family History  Problem Relation Age of Onset  . Cancer Mother   . Hypertension Father   . Heart disease Father   . Alcohol abuse Father   . Breast cancer Neg Hx     Social  History: Reviewed social history from my progress note on 05/25/2017 Social History   Socioeconomic History  . Marital status: Divorced    Spouse name: Not on file  . Number of children: 2  . Years of education: 11  . Highest education level: 11th grade  Occupational History    Employer: [employer]  Tobacco Use  . Smoking status: Former Smoker    Packs/day: 2.00    Years: 15.00    Pack years: 30.00    Types: Cigarettes    Quit date: 02/09/1995    Years since quitting: 24.7  . Smokeless tobacco: Never Used  Vaping Use  . Vaping Use: Never used  Substance and Sexual Activity  . Alcohol use: No    Alcohol/week: 0.0 standard drinks  . Drug use: No  . Sexual activity: Not Currently  Other Topics Concern  . Not on file  Social History Narrative   Patient lives at home alone. Patient works at [employer].   Caffeine daily- 2   Right handed.   Education- 11 th grade   Social Determinants of Health   Financial Resource Strain:   . Difficulty of Paying   Living Expenses: Not on file  Food Insecurity:   . Worried About Running Out of Food in the Last Year: Not on file  . Ran Out of Food in the Last Year: Not on file  Transportation Needs:   . Lack of Transportation (Medical): Not on file  . Lack of Transportation (Non-Medical): Not on file  Physical Activity:   . Days of Exercise per Week: Not on file  . Minutes of Exercise per Session: Not on file  Stress:   . Feeling of Stress : Not on file  Social Connections:   . Frequency of Communication with Friends and Family: Not on file  . Frequency of Social Gatherings with Friends and Family: Not on file  . Attends Religious Services: Not on file  . Active Member of Clubs or Organizations: Not on file  . Attends Club or Organization Meetings: Not on file  . Marital Status: Not on file    Allergies:  Allergies  Allergen Reactions  . Prolixin Decanoate [Fluphenazine]     Ineffective   . Benztropine Other (See Comments)     Hair fall out  Suicide thoughts  . Buprenorphine Hcl Other (See Comments)    Unable to void  . Carbidopa-Levodopa Nausea Only  . Codeine Hives  . Cymbalta [Duloxetine Hcl] Other (See Comments)    Altered mental status, Alopecia, visual hallucinations, nightmares  . Gabapentin Swelling  . Lyrica [Pregabalin] Other (See Comments)    Alopecia, visual hallucinations, nightmares Altered mental status  . Morphine And Related Other (See Comments)    Unable to void  . Nortripytline Hcl [Nortriptyline] Other (See Comments)    Hair loss and night mares   . Nsaids     REACTION: palpitations, diaphoresis  . Penicillins Nausea And Vomiting and Other (See Comments)    REACTION: upset stomach Has patient had a PCN reaction causing immediate rash, facial/tongue/throat swelling, SOB or lightheadedness with hypotension: No Has patient had a PCN reaction causing severe rash involving mucus membranes or skin necrosis: No Has patient had a PCN reaction that required hospitalization: No Has patient had a PCN reaction occurring within the last 10 years: No If all of the above answers are "NO", then may proceed with Cephalosporin use.  . Wellbutrin [Bupropion]     Constipation, mood swings    Metabolic Disorder Labs: Lab Results  Component Value Date   HGBA1C 6.2 (H) 03/06/2017   MPG 131.24 03/06/2017   MPG 108 05/17/2015   Lab Results  Component Value Date   PROLACTIN 64.2 (H) 03/06/2017   Lab Results  Component Value Date   CHOL 146 08/22/2018   TRIG 172 (H) 08/22/2018   HDL 49 08/22/2018   CHOLHDL 3.0 08/22/2018   VLDL 34 08/22/2018   LDLCALC 63 08/22/2018   LDLCALC 59 03/06/2017   Lab Results  Component Value Date   TSH 0.959 08/22/2018   TSH 2.766 08/24/2017    Therapeutic Level Labs: No results found for: LITHIUM No results found for: VALPROATE No components found for:  CBMZ  Current Medications: Current Outpatient Medications  Medication Sig Dispense Refill  .  insulin isophane & regular human (NOVOLIN 70/30 FLEXPEN) (70-30) 100 UNIT/ML KwikPen Inject into the skin.    . Lancets Misc. (ACCU-CHEK SOFTCLIX LANCET DEV) KIT Use 1 each 3 (three) times daily Use as instructed.    . acetaminophen (TYLENOL) 500 MG tablet Take 1,000 mg by mouth every 6 (six) hours as needed for moderate pain or headache.     .   albuterol (PROVENTIL HFA;VENTOLIN HFA) 108 (90 Base) MCG/ACT inhaler Inhale 2 puffs into the lungs every 6 (six) hours as needed for wheezing or shortness of breath.    . Alcohol Swabs (B-D SINGLE USE SWABS REGULAR) PADS     . amantadine (SYMMETREL) 100 MG capsule Take 1 capsule (100 mg total) by mouth daily. 30 capsule 2  . aspirin EC 81 MG tablet Take 81 mg by mouth daily. Swallow whole.    . Blood Glucose Calibration (ACCU-CHEK AVIVA) SOLN     . Blood Glucose Monitoring Suppl (ACCU-CHEK AVIVA PLUS) w/Device KIT     . Cholecalciferol (VITAMIN D-3) 125 MCG (5000 UT) TABS Take 5,000 Units by mouth daily.    . clindamycin (CLEOCIN) 300 MG capsule Take 600 mg by mouth See admin instructions. Take 600 mg 1 hour prior to dental work    . Cyanocobalamin (B-12 COMPLIANCE INJECTION IJ) Inject 1 Dose as directed every 30 (thirty) days.    . cyclobenzaprine (FLEXERIL) 5 MG tablet Take 5 mg by mouth 2 (two) times daily.    . diphenhydrAMINE (BENADRYL) 25 MG tablet Take 25 mg by mouth daily as needed for allergies.     Marland Kitchen docusate sodium (COLACE) 50 MG capsule Take 100 mg by mouth 2 (two) times daily.    Marland Kitchen glimepiride (AMARYL) 2 MG tablet Take 2 mg by mouth 2 (two) times daily.     Marland Kitchen HYDROcodone-acetaminophen (NORCO) 7.5-325 MG tablet Take 0.5 tablets by mouth 2 (two) times daily.     . hydroxypropyl methylcellulose / hypromellose (ISOPTO TEARS / GONIOVISC) 2.5 % ophthalmic solution Place 1 drop into both eyes every 4 (four) hours as needed for dry eyes.     . metFORMIN (GLUCOPHAGE) 500 MG tablet Take 1 tablet (500 mg total) by mouth 2 (two) times daily with a meal. For  diabetes management (Patient taking differently: Take 1,000 mg by mouth 2 (two) times daily with a meal. For diabetes management) 10 tablet 0  . montelukast (SINGULAIR) 10 MG tablet Take 10 mg by mouth at bedtime.     . nitrofurantoin, macrocrystal-monohydrate, (MACROBID) 100 MG capsule Take 100 mg by mouth 2 (two) times daily.    Marland Kitchen NOVOLIN 70/30 FLEXPEN (70-30) 100 UNIT/ML KwikPen     . OLANZapine (ZYPREXA) 5 MG tablet Take 0.5 tablets (2.5 mg total) by mouth at bedtime. 45 tablet 1  . omeprazole (PRILOSEC) 40 MG capsule Take 40 mg by mouth daily as needed (heartburn).     . ondansetron (ZOFRAN) 4 MG tablet Take 4 mg by mouth every 8 (eight) hours as needed for nausea or vomiting.    Marland Kitchen oxybutynin (DITROPAN) 5 MG tablet 1 tab tid prn frequency,urgency, bladder spasm 30 tablet 0  . pravastatin (PRAVACHOL) 40 MG tablet Take 40 mg by mouth at bedtime.     . rizatriptan (MAXALT) 10 MG tablet Take 10 mg by mouth 2 (two) times daily as needed for migraine. May repeat in 2 hours if needed     . rOPINIRole (REQUIP) 0.25 MG tablet Take 0.5 mg by mouth in the morning and at bedtime.     . sertraline (ZOLOFT) 100 MG tablet Take 2 tablets (200 mg total) by mouth daily. 180 tablet 0  . torsemide (DEMADEX) 20 MG tablet Take 20 mg by mouth every other day.     . traMADol (ULTRAM) 50 MG tablet Take 2 tablets (100 mg total) by mouth 2 (two) times daily for 15 days. (Patient taking differently: Take 50-100 mg  by mouth See admin instructions. Take 2 tablets (100 mg) by mouth in the morning, 1 tablet (50 mg) by mouth in the evening, & 1 tablet (50 mg) by mouth at night.) 30 tablet 1  . verapamil (CALAN-SR) 180 MG CR tablet Take 180 mg by mouth at bedtime.     No current facility-administered medications for this visit.     Musculoskeletal: Strength & Muscle Tone: UTA Gait & Station: UTA Patient leans: N/A  Psychiatric Specialty Exam: Review of Systems  Neurological: Positive for tremors.   Psychiatric/Behavioral: Positive for hallucinations. The patient is nervous/anxious.   All other systems reviewed and are negative.   There were no vitals taken for this visit.There is no height or weight on file to calculate BMI.  General Appearance: UTA  Eye Contact:  UTA  Speech:  Clear and Coherent  Volume:  Normal  Mood:  Anxious  Affect:  UTA  Thought Process:  Goal Directed and Descriptions of Associations: Intact  Orientation:  Full (Time, Place, and Person)  Thought Content: Hallucinations: Auditory chronic - coping well  Suicidal Thoughts:  No  Homicidal Thoughts:  No  Memory:  Immediate;   Fair Recent;   Fair Remote;   Fair  Judgement:  Fair  Insight:  Fair  Psychomotor Activity:  UTA  Concentration:  Concentration: Fair and Attention Span: Fair  Recall:  Fair  Fund of Knowledge: Fair  Language: Fair  Akathisia:  No  Handed:  Right  AIMS (if indicated): UTA  Assets:  Communication Skills Desire for Improvement Housing Social Support  ADL's:  Intact  Cognition: WNL  Sleep:  Fair   Screenings: AIMS     Admission (Discharged) from OP [facility]) [facility] Office Visit from 12/01/2017 in [facility] Office Visit from 09/20/2017 in [facility] Office Visit from 06/29/2017 in [facility]  AIMS Total Score 0 0 1 0 6    AUDIT     Admission (Discharged) from OP Visit from 08/30/2018 [facility] Ad[facility] ([facility]charged[facility] Admission (Discharged) from [facility]  Alcohol Use Disorder Identification Test Final Score (AUDIT) 0 0 0    GAD-7     Office Visit from 08/22/2015 in [facility]  Total GAD-7 Score 6     PHQ2-9     Office Visit from 03/23/2016 in [facility] Office Visit from 08/22/2015 in [facility] Clinical Support from 08/06/2015 in [facility] Office Visit from 06/10/2015 in [facility] Clinical Support from 05/19/2015 in [facility]  PHQ-2 Total Score 0 1 0 0 0       Assessment and Plan:Heather Haley is a 59-year-old Caucasian female who has a history of schizoaffective disorder, PTSD, migraine headaches, drug-induced Parkinson's disease was evaluated by phone today.  Patient is currently struggling with anxiety symptoms as well as continue to have chronic hallucinations however is able to cope with her hallucinations.  Plan as noted below.  Plan Schizoaffective disorder-stable Zyprexa at lower dosage of 2.5 mg p.o. nightly. If she continues to have uncontrolled diabetes in spite of medication changes, would consider discontinuing Zyprexa due to side effect profile.  Patient however has tried multiple   antipsychotics/mood stabilizers in the past and this is the one that works the best for her. Zoloft as prescribed  PTSD-stable Zoloft as prescribed   GAD-unstable Increase Zoloft to 200 mg p.o. daily  Neuroleptic induced Parkinson's disease-improving Amantadine 100 mg p.o. daily-reduced dosage  Patient to continue to follow-up with endocrinology as well as cardiologist for her multiple other problems including heart palpitations as well as uncontrolled diabetes.  We will coordinate care.  Follow-up in clinic in 4 to 6 weeks or sooner if needed.  I have spent atleast 20 minutes non face to face with patient today. More than 50 % of the time was spent for preparing to see the patient ( e.g., review of test, records ), ordering medications and test ,psychoeducation and supportive psychotherapy and care  coordination,as well as documenting clinical information in electronic health record. This note was generated in part or whole with voice recognition software. Voice recognition is usually quite accurate but there are transcription errors that can and very often do occur. I apologize for any typographical errors that were not detected and corrected.        , MD 10/19/2019, 11:58 AM 

## 2019-10-22 DIAGNOSIS — R002 Palpitations: Secondary | ICD-10-CM | POA: Diagnosis not present

## 2019-10-23 DIAGNOSIS — I10 Essential (primary) hypertension: Secondary | ICD-10-CM | POA: Diagnosis not present

## 2019-10-23 DIAGNOSIS — Z9889 Other specified postprocedural states: Secondary | ICD-10-CM | POA: Diagnosis not present

## 2019-10-23 DIAGNOSIS — R002 Palpitations: Secondary | ICD-10-CM | POA: Diagnosis not present

## 2019-10-23 DIAGNOSIS — E119 Type 2 diabetes mellitus without complications: Secondary | ICD-10-CM | POA: Diagnosis not present

## 2019-10-23 DIAGNOSIS — R609 Edema, unspecified: Secondary | ICD-10-CM | POA: Diagnosis not present

## 2019-10-23 DIAGNOSIS — E7800 Pure hypercholesterolemia, unspecified: Secondary | ICD-10-CM | POA: Diagnosis not present

## 2019-10-23 NOTE — Progress Notes (Deleted)
07/10/19 4:51 PM   Heather Haley Mar 06, 1960 [id number]  Referring provider: Tracie Harrier, MD [address] [facility] [place],  [place] [address] No chief complaint on file.   HPI: Heather Haley is a 59 y.o. female with hematuria and nephrolithiasis who presents today for follow up KUB and UA.  History of hematuria (high risk) She is a former smoker. Her CT urogram on 06/08/19 showed a minimally obstructing calculus at the LEFT ureteropelvic junction, LEFT ureter urothelial enhancement distal to the UPJ calculus suggest ureteral inflammation or infection and bilateral nephrolithiasis.  Her cystoscopy with Dr. Bernardo Heater on 06/25/2019 was NED.  Her urine cytology was negative.   Nephrolithiasis CTU in 05/2019 Small 2 mm nonobstructing RIGHT upper pole renal calculus. 3 mm nonobstructing LEFT renal calculus.  There is a mildly obstructing calculus at the LEFT ureteropelvic junction measuring 3 mm in axial dimension (image 46/2). This calculus is well seen on coronal image 90/5. No hydronephrosis.  KUB 07/31/2019 notes migration of the left UPJ stone into the left ureter at the sacral ala.  KUB 08/23/2019 notes migration of the left ureteral stone just under the sacrum.   KUB today stone remains unchanged in the distal ureter.  Underwent left URS/LL/ureteral stent placement and subsequent cystoscopic stent removal in office with Dr. Bernardo Heater on 09/11/2019 and 09/19/2019 respectively.  Stone composition is 70% calcium oxalate monohydrate, 10% calcium oxalate dihydrate and 20% hydroxyapatite.      PMH: Past Medical History:  Diagnosis Date  . Anxiety   . Asthma   . Chronic kidney disease   . Chronic pain    previously saw Dr. Consuela Mimes in pain clinic, then saw pain specialist in [place]  . Depression   . Diabetes mellitus ([place])   . Frequency of urination   . GERD (gastroesophageal reflux disease)   . Headache(784.0)   . High cholesterol   . History of kidney  stones   . Hypertension   . IBS (irritable bowel syndrome)   . Left ankle instability   . Left knee DJD   . Lumbar Degenerative Disc Disease of  10/11/2014  . Neuromuscular disorder ([place])   . Osteoarthritis of hip (Right) 05/05/2015  . Other enthesopathy of ankle and tarsus 12/15/2009   Qualifier: Diagnosis of  By: Oneida Alar MD, KARL    . Parkinson's disease ([place])   . Peripheral sensory neuropathy (Bilateral) 11/19/2014  . Postoperative nausea and vomiting   . Schizophrenia [facility])     Surgical History: Past Surgical History:  Procedure Laterality Date  . ABDOMINAL HYSTERECTOMY    . ANKLE SURGERY Left    x 2  . APPENDECTOMY    . CARDIAC CATHETERIZATION    . COLONOSCOPY  2013  . COLONOSCOPY WITH PROPOFOL N/A 04/25/2017   Procedure: COLONOSCOPY WITH PROPOFOL;  Surgeon: Manya Silvas, MD;  Location: [facility] ENDOSCOPY;  Service: Endoscopy;  Laterality: N/A;  . CYSTOSCOPY/URETEROSCOPY/HOLMIUM LASER/STENT PLACEMENT Left 09/11/2019   Procedure: CYSTOSCOPY/URETEROSCOPY/HOLMIUM LASER/STENT PLACEMENT;  Surgeon: Abbie Sons, MD;  Location: [facility] ORS;  Service: Urology;  Laterality: Left;  . ESOPHAGOGASTRODUODENOSCOPY (EGD) WITH PROPOFOL N/A 10/03/2014   Procedure: ESOPHAGOGASTRODUODENOSCOPY (EGD) WITH PROPOFOL;  Surgeon: Josefine Class, MD;  Location: [facility] ENDOSCOPY;  Service: Endoscopy;  Laterality: N/A;  . ESOPHAGOGASTRODUODENOSCOPY (EGD) WITH PROPOFOL  04/25/2017   Procedure: ESOPHAGOGASTRODUODENOSCOPY (EGD) WITH PROPOFOL;  Surgeon: Manya Silvas, MD;  Location: [facility] ENDOSCOPY;  Service: Endoscopy;;  . JOINT REPLACEMENT Left    knee  . KNEE ARTHROSCOPY  1997  left knee  . LEFT HEART CATH AND CORONARY ANGIOGRAPHY Left 11/10/2017   Procedure: LEFT HEART CATH AND CORONARY ANGIOGRAPHY;  Surgeon: Isaias Cowman, MD;  Location: [facility] CV LAB;  Service: Cardiovascular;  Laterality: Left;  . LEFT HEART CATHETERIZATION WITH CORONARY ANGIOGRAM N/A 01/11/2013   Procedure: LEFT HEART  CATHETERIZATION WITH CORONARY ANGIOGRAM;  Surgeon: Sinclair Grooms, MD;  Location: [facility] CATH LAB;  Service: Cardiovascular;  Laterality: N/A;  . TOTAL KNEE ARTHROPLASTY  08/30/2011   Procedure: TOTAL KNEE ARTHROPLASTY;  Surgeon: Lorn Junes, MD;  Location: [place];  Service: Orthopedics;  Laterality: Left;    Home Medications:  Allergies as of 10/24/2019      Reactions   Prolixin Decanoate [fluphenazine]    Ineffective    Benztropine Other (See Comments)   Hair fall out  Suicide thoughts   Buprenorphine Hcl Other (See Comments)   Unable to void   Carbidopa-levodopa Nausea Only   Codeine Hives   Cymbalta [duloxetine Hcl] Other (See Comments)   Altered mental status, Alopecia, visual hallucinations, nightmares   Gabapentin Swelling   Lyrica [pregabalin] Other (See Comments)   Alopecia, visual hallucinations, nightmares Altered mental status   Morphine And Related Other (See Comments)   Unable to void   Nortripytline Hcl [nortriptyline] Other (See Comments)   Hair loss and night mares   Nsaids    REACTION: palpitations, diaphoresis   Penicillins Nausea And Vomiting, Other (See Comments)   REACTION: upset stomach Has patient had a PCN reaction causing immediate rash, facial/tongue/throat swelling, SOB or lightheadedness with hypotension: No Has patient had a PCN reaction causing severe rash involving mucus membranes or skin necrosis: No Has patient had a PCN reaction that required hospitalization: No Has patient had a PCN reaction occurring within the last 10 years: No If all of the above answers are "NO", then may proceed with Cephalosporin use.   Wellbutrin [bupropion]    Constipation, mood swings      Medication List       Accurate as of October 23, 2019  4:51 PM. If you have any questions, ask your nurse or doctor.        Accu-Chek Aviva Plus w/Device Kit   Accu-Chek Aviva Soln   Accu-Chek Softclix Lancet Dev Kit Use 1 each 3 (three) times daily Use as  instructed.   acetaminophen 500 MG tablet Commonly known as: TYLENOL Take 1,000 mg by mouth every 6 (six) hours as needed for moderate pain or headache.   albuterol 108 (90 Base) MCG/ACT inhaler Commonly known as: VENTOLIN HFA Inhale 2 puffs into the lungs every 6 (six) hours as needed for wheezing or shortness of breath.   amantadine 100 MG capsule Commonly known as: SYMMETREL Take 1 capsule (100 mg total) by mouth daily.   aspirin EC 81 MG tablet Take 81 mg by mouth daily. Swallow whole.   B-12 COMPLIANCE INJECTION IJ Inject 1 Dose as directed every 30 (thirty) days.   B-D SINGLE USE SWABS REGULAR Pads   clindamycin 300 MG capsule Commonly known as: CLEOCIN Take 600 mg by mouth See admin instructions. Take 600 mg 1 hour prior to dental work   cyclobenzaprine 5 MG tablet Commonly known as: FLEXERIL Take 5 mg by mouth 2 (two) times daily.   diphenhydrAMINE 25 MG tablet Commonly known as: BENADRYL Take 25 mg by mouth daily as needed for allergies.   docusate sodium 50 MG capsule Commonly known as: COLACE Take 100 mg by mouth 2 (two) times  daily.   glimepiride 2 MG tablet Commonly known as: AMARYL Take 2 mg by mouth 2 (two) times daily.   HYDROcodone-acetaminophen 7.5-325 MG tablet Commonly known as: NORCO Take 0.5 tablets by mouth 2 (two) times daily.   hydroxypropyl methylcellulose / hypromellose 2.5 % ophthalmic solution Commonly known as: ISOPTO TEARS / GONIOVISC Place 1 drop into both eyes every 4 (four) hours as needed for dry eyes.   metFORMIN 500 MG tablet Commonly known as: GLUCOPHAGE Take 1 tablet (500 mg total) by mouth 2 (two) times daily with a meal. For diabetes management What changed: how much to take   montelukast 10 MG tablet Commonly known as: SINGULAIR Take 10 mg by mouth at bedtime.   nitrofurantoin (macrocrystal-monohydrate) 100 MG capsule Commonly known as: MACROBID Take 100 mg by mouth 2 (two) times daily.   NovoLIN 70/30 FlexPen  (70-30) 100 UNIT/ML KwikPen Generic drug: insulin isophane & regular human   NovoLIN 70/30 FlexPen (70-30) 100 UNIT/ML KwikPen Generic drug: insulin isophane & regular human Inject into the skin.   OLANZapine 5 MG tablet Commonly known as: ZyPREXA Take 0.5 tablets (2.5 mg total) by mouth at bedtime.   omeprazole 40 MG capsule Commonly known as: PRILOSEC Take 40 mg by mouth daily as needed (heartburn).   ondansetron 4 MG tablet Commonly known as: ZOFRAN Take 4 mg by mouth every 8 (eight) hours as needed for nausea or vomiting.   oxybutynin 5 MG tablet Commonly known as: DITROPAN 1 tab tid prn frequency,urgency, bladder spasm   pravastatin 40 MG tablet Commonly known as: PRAVACHOL Take 40 mg by mouth at bedtime.   rizatriptan 10 MG tablet Commonly known as: MAXALT Take 10 mg by mouth 2 (two) times daily as needed for migraine. May repeat in 2 hours if needed   rOPINIRole 0.25 MG tablet Commonly known as: REQUIP Take 0.5 mg by mouth in the morning and at bedtime.   sertraline 100 MG tablet Commonly known as: ZOLOFT Take 2 tablets (200 mg total) by mouth daily.   torsemide 20 MG tablet Commonly known as: DEMADEX Take 20 mg by mouth every other day.   traMADol 50 MG tablet Commonly known as: ULTRAM Take 2 tablets (100 mg total) by mouth 2 (two) times daily for 15 days. What changed:   how much to take  when to take this  additional instructions   verapamil 180 MG CR tablet Commonly known as: CALAN-SR Take 180 mg by mouth at bedtime.   Vitamin D-3 125 MCG (5000 UT) Tabs Take 5,000 Units by mouth daily.       Allergies:  Allergies  Allergen Reactions  . Prolixin Decanoate [Fluphenazine]     Ineffective   . Benztropine Other (See Comments)    Hair fall out  Suicide thoughts  . Buprenorphine Hcl Other (See Comments)    Unable to void  . Carbidopa-Levodopa Nausea Only  . Codeine Hives  . Cymbalta [Duloxetine Hcl] Other (See Comments)    Altered  mental status, Alopecia, visual hallucinations, nightmares  . Gabapentin Swelling  . Lyrica [Pregabalin] Other (See Comments)    Alopecia, visual hallucinations, nightmares Altered mental status  . Morphine And Related Other (See Comments)    Unable to void  . Nortripytline Hcl [Nortriptyline] Other (See Comments)    Hair loss and night mares   . Nsaids     REACTION: palpitations, diaphoresis  . Penicillins Nausea And Vomiting and Other (See Comments)    REACTION: upset stomach Has patient had a  PCN reaction causing immediate rash, facial/tongue/throat swelling, SOB or lightheadedness with hypotension: No Has patient had a PCN reaction causing severe rash involving mucus membranes or skin necrosis: No Has patient had a PCN reaction that required hospitalization: No Has patient had a PCN reaction occurring within the last 10 years: No If all of the above answers are "NO", then may proceed with Cephalosporin use.  . Wellbutrin [Bupropion]     Constipation, mood swings    Family History: Family History  Problem Relation Age of Onset  . Cancer Mother   . Hypertension Father   . Heart disease Father   . Alcohol abuse Father   . Breast cancer Neg Hx     Social History:  reports that she quit smoking about 24 years ago. Her smoking use included cigarettes. She has a 30.00 pack-year smoking history. She has never used smokeless tobacco. She reports that she does not drink alcohol and does not use drugs.   Physical Exam: There were no vitals taken for this visit.  Constitutional:  Well nourished. Alert and oriented, No acute distress. HEENT: [place] AT, moist mucus membranes.  Trachea midline, no masses. Cardiovascular: No clubbing, cyanosis, or edema. Respiratory: Normal respiratory effort, no increased work of breathing. GI: Abdomen is soft, non tender, non distended, no abdominal masses. Liver and spleen not palpable.  No hernias appreciated.  Stool sample for occult testing is not  indicated.   GU: No CVA tenderness.  No bladder fullness or masses.  *** external genitalia, *** pubic hair distribution, no lesions.  Normal urethral meatus, no lesions, no prolapse, no discharge.   No urethral masses, tenderness and/or tenderness. No bladder fullness, tenderness or masses. *** vagina mucosa, *** estrogen effect, no discharge, no lesions, *** pelvic support, *** cystocele and *** rectocele noted.  No cervical motion tenderness.  Uterus is freely mobile and non-fixed.  No adnexal/parametria masses or tenderness noted.  Anus and perineum are without rashes or lesions.   ***  Skin: No rashes, bruises or suspicious lesions. Lymph: No cervical or inguinal adenopathy. Neurologic: Grossly intact, no focal deficits, moving all 4 extremities. Psychiatric: Normal mood and affect.   Laboratory Data: Lab Results  Component Value Date   CREATININE 0.89 08/30/2019    Lab Results  Component Value Date   HGBA1C 6.2 (H) 03/06/2017    Urinalysis Component     Latest Ref Rng & Units 08/30/2019  Specific Gravity, UA     1.005 - 1.030 1.010  pH, UA     5.0 - 7.5 5.0  Color, UA     Yellow Yellow  Appearance Ur     Clear Clear  Leukocytes,UA     Negative Negative  Protein,UA     Negative/Trace Negative  Glucose, UA     Negative 3+ (A)  Ketones, UA     Negative Trace (A)  RBC, UA     Negative Trace (A)  Bilirubin, UA     Negative Negative  Urobilinogen, Ur     0.2 - 1.0 mg/dL 0.2  Nitrite, UA     Negative Negative  Microscopic Examination      See below:   Component     Latest Ref Rng & Units 08/30/2019         2:45 PM  WBC, UA     0 - 5 /hpf 6-10 (A)  RBC     3.77 - 5.28 x10E6/uL None seen  Epithelial Cells (non renal)     0 -  10 /hpf >10 (H)  Renal Epithel, UA     None seen /hpf 0-10 (A)  Mucus, UA     Not Estab. Present (A)  Bacteria, UA     None seen/Few Few (A)  Yeast, UA     None seen Present (A)   I have reviewed the labs.  Pertinent  Imaging: CLINICAL DATA:  Nephrolithiasis  EXAM: ABDOMEN - 1 VIEW  COMPARISON:  08/23/2019  FINDINGS: Previously noted distal left ureteral calculus has migrated slightly distally, but remains within the expected distal ureter. Calculus measures roughly 4 mm. No additional nephro or urolithiasis. Phleboliths noted within the pelvis. Normal abdominal gas pattern.  IMPRESSION: Slight interval antegrade progression of left ureteral calculus.   Electronically Signed   By: Fidela Salisbury MD   On: 08/31/2019 21:27 I have independently reviewed the films.  See HPI.   Assessment & Plan:    1. Left ureteral stone S/P left URS Calcium oxalate stones  2. History of hematuria - high risk Hematuria work up completed in 06/2019 - findings positive for nephrolithiaisis No report of gross hematuria  RTC in one year for UA - patient to report any gross hematuria in the interim    3. OAB Will address after expulsion or treatment of stone   [place] [facility] [address] [place], [place] [address] [phone]

## 2019-10-24 ENCOUNTER — Ambulatory Visit: Payer: Self-pay | Admitting: Urology

## 2019-10-25 ENCOUNTER — Encounter: Payer: Self-pay | Admitting: Urology

## 2019-10-25 DIAGNOSIS — E782 Mixed hyperlipidemia: Secondary | ICD-10-CM | POA: Diagnosis not present

## 2019-10-25 DIAGNOSIS — I10 Essential (primary) hypertension: Secondary | ICD-10-CM | POA: Diagnosis not present

## 2019-10-25 DIAGNOSIS — G43719 Chronic migraine without aura, intractable, without status migrainosus: Secondary | ICD-10-CM | POA: Diagnosis not present

## 2019-10-25 DIAGNOSIS — E1165 Type 2 diabetes mellitus with hyperglycemia: Secondary | ICD-10-CM | POA: Diagnosis not present

## 2019-10-25 DIAGNOSIS — Z794 Long term (current) use of insulin: Secondary | ICD-10-CM | POA: Diagnosis not present

## 2019-10-25 DIAGNOSIS — E669 Obesity, unspecified: Secondary | ICD-10-CM | POA: Diagnosis not present

## 2019-10-30 ENCOUNTER — Telehealth: Payer: Self-pay

## 2019-10-30 NOTE — Telephone Encounter (Signed)
pt called left message that she needs a refill on olanzapine 5mg 

## 2019-10-30 NOTE — Telephone Encounter (Signed)
Patient is not due for olanzapine, she was prescribed 90-day supply with refill in July.    She needs to check with her pharmacist.

## 2019-11-28 ENCOUNTER — Telehealth: Payer: Self-pay

## 2019-11-28 NOTE — Telephone Encounter (Signed)
pt called left message that she would like to increase the dosage of zoloft  her anxiety is bad

## 2019-11-28 NOTE — Telephone Encounter (Signed)
Returned call to patient. Left voicemail.

## 2019-11-29 ENCOUNTER — Telehealth: Payer: Self-pay

## 2019-11-29 DIAGNOSIS — F411 Generalized anxiety disorder: Secondary | ICD-10-CM

## 2019-11-29 DIAGNOSIS — F251 Schizoaffective disorder, depressive type: Secondary | ICD-10-CM

## 2019-11-29 DIAGNOSIS — F4310 Post-traumatic stress disorder, unspecified: Secondary | ICD-10-CM

## 2019-11-29 MED ORDER — SERTRALINE HCL 100 MG PO TABS: 50.0000 mg | ORAL_TABLET | Freq: Every day | ORAL | 0 refills | Status: DC

## 2019-11-29 MED ORDER — FLUOXETINE HCL 20 MG PO CAPS: 20.0000 mg | ORAL_CAPSULE | ORAL | 1 refills | Status: DC

## 2019-11-29 NOTE — Telephone Encounter (Signed)
pt was returning your call

## 2019-11-29 NOTE — Telephone Encounter (Signed)
Returned call to patient.  She reports she is very anxious.  The Zoloft is not effective anymore.  We will taper off Zoloft.  Advised patient to take 50 mg for the next 1 week and stop taking it. Start Prozac 20 mg p.o. daily with breakfast for the next 1 week and increase to 40 mg after that.  Patient does have upcoming appointment on 12/07/2019.  Crisis plan discussed with patient.

## 2019-12-07 ENCOUNTER — Ambulatory Visit: Payer: Medicare HMO | Admitting: Endocrinology

## 2019-12-07 ENCOUNTER — Telehealth (INDEPENDENT_AMBULATORY_CARE_PROVIDER_SITE_OTHER): Payer: Medicare HMO | Admitting: Psychiatry

## 2019-12-07 ENCOUNTER — Encounter: Payer: Self-pay | Admitting: Psychiatry

## 2019-12-07 ENCOUNTER — Other Ambulatory Visit: Payer: Self-pay

## 2019-12-07 DIAGNOSIS — F251 Schizoaffective disorder, depressive type: Secondary | ICD-10-CM | POA: Diagnosis not present

## 2019-12-07 DIAGNOSIS — E1165 Type 2 diabetes mellitus with hyperglycemia: Secondary | ICD-10-CM | POA: Diagnosis not present

## 2019-12-07 DIAGNOSIS — F5105 Insomnia due to other mental disorder: Secondary | ICD-10-CM

## 2019-12-07 DIAGNOSIS — F411 Generalized anxiety disorder: Secondary | ICD-10-CM | POA: Diagnosis not present

## 2019-12-07 DIAGNOSIS — G2119 Other drug induced secondary parkinsonism: Secondary | ICD-10-CM

## 2019-12-07 DIAGNOSIS — I10 Essential (primary) hypertension: Secondary | ICD-10-CM | POA: Diagnosis not present

## 2019-12-07 DIAGNOSIS — F4310 Post-traumatic stress disorder, unspecified: Secondary | ICD-10-CM

## 2019-12-07 NOTE — Progress Notes (Signed)
Virtual Visit via Telephone Note  I connected with Heather Haley on 12/07/19 at 10:00 AM EDT by telephone and verified that I am speaking with the correct person using two identifiers.  Location Provider Location : [facility] Patient Location : Car  Participants: Patient , Provider   I discussed the limitations, risks, security and privacy concerns of performing an evaluation and management service by telephone and the availability of in person appointments. I also discussed with the patient that there may be a patient responsible charge related to this service. The patient expressed understanding and agreed to proceed.     I discussed the assessment and treatment plan with the patient. The patient was provided an opportunity to ask questions and all were answered. The patient agreed with the plan and demonstrated an understanding of the instructions.   The patient was advised to call back or seek an in-person evaluation if the symptoms worsen or if the condition fails to improve as anticipated.   [place] MD OP Progress Note  12/07/2019 12:42 PM Heather Haley  MRN:  [id number]  Chief Complaint:  Chief Complaint    Follow-up     HPI: Heather Haley is a 59 year old Caucasian female, lives in [place], has a history of schizoaffective disorder, PTSD, GAD, drug-induced Parkinson's disease, headaches, memory problems, diabetes melitis, chronic pain was evaluated by phone today.  Patient today reports she is tolerating the new medication, Prozac well.  She is currently on 20 mg and is currently being tapered off of the Zoloft.  She will take her last dose of Zoloft tomorrow.  She reports she will go up on the Prozac as discussed to 40 mg in a couple of days.  She reports her anxiety symptoms as improving since being on the Prozac.  She continues to have her moments when she feels anxious however she is able to cope with it better.  She does have auditory hallucinations on and off  however she is coping well and it does not distress her.  Patient reports sleep is good.  She denies any suicidality, homicidality or perceptual disturbances.  Patient denies any other concerns today.  Visit Diagnosis:    ICD-10-CM   1. Schizoaffective disorder, depressive type ([place])  F25.1   2. PTSD (post-traumatic stress disorder)  F43.10   3. Generalized anxiety disorder  F41.1   4. Insomnia due to mental disorder  F51.05   5. Drug-induced Parkinson's disease ([place])  G21.19     Past Psychiatric History: I have reviewed past psychiatric history from my progress note on 05/25/2017.  Past Medical History:  Past Medical History:  Diagnosis Date  . Anxiety   . Asthma   . Chronic kidney disease   . Chronic pain    previously saw Dr. Consuela Mimes in pain clinic, then saw pain specialist in [place]  . Depression   . Diabetes mellitus ([place])   . Frequency of urination   . GERD (gastroesophageal reflux disease)   . Headache(784.0)   . High cholesterol   . History of kidney stones   . Hypertension   . IBS (irritable bowel syndrome)   . Left ankle instability   . Left knee DJD   . Lumbar Degenerative Disc Disease of  10/11/2014  . Neuromuscular disorder ([place])   . Osteoarthritis of hip (Right) 05/05/2015  . Other enthesopathy of ankle and tarsus 12/15/2009   Qualifier: Diagnosis of  By: Oneida Alar MD, KARL    . Parkinson's disease ([place])   . Peripheral  sensory neuropathy (Bilateral) 11/19/2014  . Postoperative nausea and vomiting   . Schizophrenia [facility])     Past Surgical History:  Procedure Laterality Date  . ABDOMINAL HYSTERECTOMY    . ANKLE SURGERY Left    x 2  . APPENDECTOMY    . CARDIAC CATHETERIZATION    . COLONOSCOPY  2013  . COLONOSCOPY WITH PROPOFOL N/A 04/25/2017   Procedure: COLONOSCOPY WITH PROPOFOL;  Surgeon: Manya Silvas, MD;  Location: [facility] ENDOSCOPY;  Service: Endoscopy;  Laterality: N/A;  . CYSTOSCOPY/URETEROSCOPY/HOLMIUM LASER/STENT PLACEMENT Left 09/11/2019    Procedure: CYSTOSCOPY/URETEROSCOPY/HOLMIUM LASER/STENT PLACEMENT;  Surgeon: Abbie Sons, MD;  Location: [facility] ORS;  Service: Urology;  Laterality: Left;  . ESOPHAGOGASTRODUODENOSCOPY (EGD) WITH PROPOFOL N/A 10/03/2014   Procedure: ESOPHAGOGASTRODUODENOSCOPY (EGD) WITH PROPOFOL;  Surgeon: Josefine Class, MD;  Location: [facility] ENDOSCOPY;  Service: Endoscopy;  Laterality: N/A;  . ESOPHAGOGASTRODUODENOSCOPY (EGD) WITH PROPOFOL  04/25/2017   Procedure: ESOPHAGOGASTRODUODENOSCOPY (EGD) WITH PROPOFOL;  Surgeon: Manya Silvas, MD;  Location: [facility] ENDOSCOPY;  Service: Endoscopy;;  . JOINT REPLACEMENT Left    knee  . KNEE ARTHROSCOPY  1997   left knee  . LEFT HEART CATH AND CORONARY ANGIOGRAPHY Left 11/10/2017   Procedure: LEFT HEART CATH AND CORONARY ANGIOGRAPHY;  Surgeon: Isaias Cowman, MD;  Location: [place] CV LAB;  Service: Cardiovascular;  Laterality: Left;  . LEFT HEART CATHETERIZATION WITH CORONARY ANGIOGRAM N/A 01/11/2013   Procedure: LEFT HEART CATHETERIZATION WITH CORONARY ANGIOGRAM;  Surgeon: Sinclair Grooms, MD;  Location: [facility] CATH LAB;  Service: Cardiovascular;  Laterality: N/A;  . TOTAL KNEE ARTHROPLASTY  08/30/2011   Procedure: TOTAL KNEE ARTHROPLASTY;  Surgeon: Lorn Junes, MD;  Location: [place];  Service: Orthopedics;  Laterality: Left;    Family Psychiatric History: I have reviewed family psychiatric history from my progress note on 05/25/2017.  Family History:  Family History  Problem Relation Age of Onset  . Cancer Mother   . Hypertension Father   . Heart disease Father   . Alcohol abuse Father   . Breast cancer Neg Hx     Social History: I have reviewed social history from my progress note on 05/25/2017. Social History   Socioeconomic History  . Marital status: Divorced    Spouse name: Not on file  . Number of children: 2  . Years of education: 37  . Highest education level: 11th grade  Occupational History    Employer: [employer]  Tobacco  Use  . Smoking status: Former Smoker    Packs/day: 2.00    Years: 15.00    Pack years: 30.00    Types: Cigarettes    Quit date: 02/09/1995    Years since quitting: 24.8  . Smokeless tobacco: Never Used  Vaping Use  . Vaping Use: Never used  Substance and Sexual Activity  . Alcohol use: No    Alcohol/week: 0.0 standard drinks  . Drug use: No  . Sexual activity: Not Currently  Other Topics Concern  . Not on file  Social History Narrative   Patient lives at home alone. Patient works at [employer].   Caffeine daily- 2   Right handed.   Education- 11 th grade   Social Determinants of Health   Financial Resource Strain:   . Difficulty of Paying Living Expenses: Not on file  Food Insecurity:   . Worried About Charity fundraiser in the Last Year: Not on file  . Ran Out of Food in the Last Year: Not on file  Transportation Needs:   . Film/video editor (Medical): Not on file  . Lack of Transportation (Non-Medical): Not on file  Physical Activity:   . Days of Exercise per Week: Not on file  . Minutes of Exercise per Session: Not on file  Stress:   . Feeling of Stress : Not on file  Social Connections:   . Frequency of Communication with Friends and Family: Not on file  . Frequency of Social Gatherings with Friends and Family: Not on file  . Attends Religious Services: Not on file  . Active Member of Clubs or Organizations: Not on file  . Attends Archivist Meetings: Not on file  . Marital Status: Not on file    Allergies:  Allergies  Allergen Reactions  . Prolixin Decanoate [Fluphenazine]     Ineffective   . Benztropine Other (See Comments)    Hair fall out  Suicide thoughts  . Buprenorphine Hcl Other (See Comments)    Unable to void  . Carbidopa-Levodopa Nausea Only  . Codeine Hives  . Cymbalta [Duloxetine Hcl] Other (See Comments)    Altered mental status, Alopecia, visual hallucinations, nightmares  . Gabapentin Swelling  . Lyrica [Pregabalin]  Other (See Comments)    Alopecia, visual hallucinations, nightmares Altered mental status  . Morphine And Related Other (See Comments)    Unable to void  . Nortripytline Hcl [Nortriptyline] Other (See Comments)    Hair loss and night mares   . Nsaids     REACTION: palpitations, diaphoresis  . Penicillins Nausea And Vomiting and Other (See Comments)    REACTION: upset stomach Has patient had a PCN reaction causing immediate rash, facial/tongue/throat swelling, SOB or lightheadedness with hypotension: No Has patient had a PCN reaction causing severe rash involving mucus membranes or skin necrosis: No Has patient had a PCN reaction that required hospitalization: No Has patient had a PCN reaction occurring within the last 10 years: No If all of the above answers are "NO", then may proceed with Cephalosporin use.  . Wellbutrin [Bupropion]     Constipation, mood swings    Metabolic Disorder Labs: Lab Results  Component Value Date   HGBA1C 6.2 (H) 03/06/2017   MPG 131.24 03/06/2017   MPG 108 05/17/2015   Lab Results  Component Value Date   PROLACTIN 64.2 (H) 03/06/2017   Lab Results  Component Value Date   CHOL 146 08/22/2018   TRIG 172 (H) 08/22/2018   HDL 49 08/22/2018   CHOLHDL 3.0 08/22/2018   VLDL 34 08/22/2018   LDLCALC 63 08/22/2018   LDLCALC 59 03/06/2017   Lab Results  Component Value Date   TSH 0.959 08/22/2018   TSH 2.766 08/24/2017    Therapeutic Level Labs: No results found for: LITHIUM No results found for: VALPROATE No components found for:  CBMZ  Current Medications: Current Outpatient Medications  Medication Sig Dispense Refill  . ACCU-CHEK GUIDE test strip     . acetaminophen (TYLENOL) 500 MG tablet Take 1,000 mg by mouth every 6 (six) hours as needed for moderate pain or headache.     . albuterol (PROVENTIL HFA;VENTOLIN HFA) 108 (90 Base) MCG/ACT inhaler Inhale 2 puffs into the lungs every 6 (six) hours as needed for wheezing or shortness of  breath.    . Alcohol Swabs (B-D SINGLE USE SWABS REGULAR) PADS     . amantadine (SYMMETREL) 100 MG capsule Take 1 capsule (100 mg total) by mouth daily. 30 capsule 2  . aspirin EC 81 MG tablet  Take 81 mg by mouth daily. Swallow whole.    . Blood Glucose Calibration (ACCU-CHEK AVIVA) SOLN     . Blood Glucose Monitoring Suppl (ACCU-CHEK AVIVA PLUS) w/Device KIT     . Cholecalciferol (VITAMIN D-3) 125 MCG (5000 UT) TABS Take 5,000 Units by mouth daily.    . clindamycin (CLEOCIN) 300 MG capsule Take 600 mg by mouth See admin instructions. Take 600 mg 1 hour prior to dental work    . Cyanocobalamin (B-12 COMPLIANCE INJECTION IJ) Inject 1 Dose as directed every 30 (thirty) days.    . cyclobenzaprine (FLEXERIL) 5 MG tablet Take 5 mg by mouth 2 (two) times daily.    . diphenhydrAMINE (BENADRYL) 25 MG tablet Take 25 mg by mouth daily as needed for allergies.     Marland Kitchen docusate sodium (COLACE) 50 MG capsule Take 100 mg by mouth 2 (two) times daily.    Marland Kitchen FLUoxetine (PROZAC) 20 MG capsule Take 1-2 capsules (20-40 mg total) by mouth as directed. Start taking 1 cap daily with breakfast  for 1 week and increase to 2 cap after that 60 capsule 1  . glimepiride (AMARYL) 2 MG tablet Take 2 mg by mouth 2 (two) times daily.     Marland Kitchen HYDROcodone-acetaminophen (NORCO) 7.5-325 MG tablet Take 0.5 tablets by mouth 2 (two) times daily.     . hydroxypropyl methylcellulose / hypromellose (ISOPTO TEARS / GONIOVISC) 2.5 % ophthalmic solution Place 1 drop into both eyes every 4 (four) hours as needed for dry eyes.     . insulin isophane & regular human (NOVOLIN 70/30 FLEXPEN) (70-30) 100 UNIT/ML KwikPen Inject into the skin.    . Insulin Syringe-Needle U-100 (INSULIN SYRINGE 1CC/31GX5/16") 31G X 5/16" 1 ML MISC See admin instructions.    . Lancets Misc. (ACCU-CHEK SOFTCLIX LANCET DEV) KIT Use 1 each 3 (three) times daily Use as instructed.    . metFORMIN (GLUCOPHAGE) 500 MG tablet Take 1 tablet (500 mg total) by mouth 2 (two) times  daily with a meal. For diabetes management (Patient taking differently: Take 1,000 mg by mouth 2 (two) times daily with a meal. For diabetes management) 10 tablet 0  . montelukast (SINGULAIR) 10 MG tablet Take 10 mg by mouth at bedtime.     . nitrofurantoin, macrocrystal-monohydrate, (MACROBID) 100 MG capsule Take 100 mg by mouth 2 (two) times daily.    Marland Kitchen NOVOLIN 70/30 FLEXPEN (70-30) 100 UNIT/ML KwikPen     . OLANZapine (ZYPREXA) 5 MG tablet Take 0.5 tablets (2.5 mg total) by mouth at bedtime. 45 tablet 1  . omeprazole (PRILOSEC) 40 MG capsule Take 40 mg by mouth daily as needed (heartburn).     . ondansetron (ZOFRAN) 4 MG tablet Take 4 mg by mouth every 8 (eight) hours as needed for nausea or vomiting.    Marland Kitchen oxybutynin (DITROPAN) 5 MG tablet 1 tab tid prn frequency,urgency, bladder spasm 30 tablet 0  . pravastatin (PRAVACHOL) 40 MG tablet Take 40 mg by mouth at bedtime.     . predniSONE (STERAPRED UNI-PAK 21 TAB) 10 MG (21) TBPK tablet Take by mouth as directed.    Marland Kitchen RELION INSULIN SYRINGE 31G X 15/64" 1 ML MISC     . rizatriptan (MAXALT) 10 MG tablet Take 10 mg by mouth 2 (two) times daily as needed for migraine. May repeat in 2 hours if needed     . rOPINIRole (REQUIP) 0.25 MG tablet Take 0.5 mg by mouth in the morning and at bedtime.     Marland Kitchen  torsemide (DEMADEX) 20 MG tablet Take 20 mg by mouth every other day.     . traMADol (ULTRAM) 50 MG tablet Take 2 tablets (100 mg total) by mouth 2 (two) times daily for 15 days. (Patient taking differently: Take 50-100 mg by mouth See admin instructions. Take 2 tablets (100 mg) by mouth in the morning, 1 tablet (50 mg) by mouth in the evening, & 1 tablet (50 mg) by mouth at night.) 30 tablet 1  . verapamil (CALAN-SR) 180 MG CR tablet Take 180 mg by mouth at bedtime.     No current facility-administered medications for this visit.     Musculoskeletal: Strength & Muscle Tone: UTA Gait & Station: UTA Patient leans: N/A  Psychiatric Specialty  Exam: Review of Systems  Psychiatric/Behavioral: The patient is nervous/anxious.   All other systems reviewed and are negative.   There were no vitals taken for this visit.There is no height or weight on file to calculate BMI.  General Appearance: UTA  Eye Contact:  UTA  Speech:  Clear and Coherent  Volume:  Normal  Mood:  Anxious  Affect:  UTA  Thought Process:  Goal Directed and Descriptions of Associations: Intact  Orientation:  Full (Time, Place, and Person)  Thought Content: AH - hear voices, chronic, does not bother her  Suicidal Thoughts:  No  Homicidal Thoughts:  No  Memory:  Immediate;   Fair Recent;   Fair Remote;   Fair  Judgement:  Fair  Insight:  Fair  Psychomotor Activity:  UTA  Concentration:  Concentration: Fair and Attention Span: Fair  Recall:  [employer] of Knowledge: Fair  Language: Fair  Akathisia:  No  Handed:  Right  AIMS (if indicated): UTA  Assets:  Communication Skills Desire for Improvement Housing Social Support  ADL's:  Intact  Cognition: WNL  Sleep:  Fair   Screenings: AIMS     Admission (Discharged) from OP Visit from 08/30/2018 in [place] Admission (Discharged) from 08/22/2018 in [place] [facility] Office Visit from 12/01/2017 in [place] Office Visit from 09/20/2017 in [place] Office Visit from 06/29/2017 in [place] Total Score 0 0 1 0 6    AUDIT     Admission (Discharged) from OP Visit from 08/30/2018 in [place] Admission (Discharged) from 08/22/2018 in [place] [facility] Admission (Discharged) from 03/03/2017 in [place] [facility]  Alcohol Use Disorder Identification Test Final Score (AUDIT) 0 0 0    GAD-7     Office Visit from 08/22/2015 in [place] at [facility]  Total GAD-7 Score 6     PHQ2-9     Office Visit from 03/23/2016 in [place] Office Visit from 08/22/2015 in [place] at [place] from 08/06/2015 in [place] Office Visit from 06/10/2015 in [place] Clinical Support from 05/19/2015 in [place]  PHQ-2 Total Score 0 1 0 0 0       Assessment and Plan: Heather Haley is a 59 year old Caucasian female who has a history of schizoaffective disorder, PTSD, migraine headaches, drug-induced Parkinson's disease was evaluated by phone today.  She is currently making progress on the current medication changes.  Plan as noted below.  Plan Schizoaffective disorder-stable Zyprexa at low dosage of 2.5 mg p.o. nightly  PTSD-stable We will monitor closely  GAD-some progress Increase Prozac to 40 mg p.o. daily. Discontinue Zoloft. Patient was advised to taper it off.  Drug-induced Parkinson's disease-improving Amantadine 100 mg p.o. daily at reduced dosage.  Follow-up in clinic in 4 weeks or sooner if needed.  I have spent atleast 15 minutes non face to face with patient today. More than 50 % of the time was spent for preparing to see the patient ( e.g., review of test, records ), ordering medications and test ,psychoeducation and supportive psychotherapy and care coordination,as well as documenting clinical information in electronic health record. This note was generated in part or whole with voice recognition software. Voice recognition is usually quite accurate but there are transcription errors that can and very often do occur. I apologize for any typographical errors that were not detected and corrected.        Ursula Alert, MD 12/07/2019, 12:42 PM

## 2019-12-13 DIAGNOSIS — E782 Mixed hyperlipidemia: Secondary | ICD-10-CM | POA: Diagnosis not present

## 2019-12-13 DIAGNOSIS — Z794 Long term (current) use of insulin: Secondary | ICD-10-CM | POA: Diagnosis not present

## 2019-12-13 DIAGNOSIS — E1165 Type 2 diabetes mellitus with hyperglycemia: Secondary | ICD-10-CM | POA: Diagnosis not present

## 2019-12-13 DIAGNOSIS — E669 Obesity, unspecified: Secondary | ICD-10-CM | POA: Diagnosis not present

## 2019-12-13 DIAGNOSIS — I10 Essential (primary) hypertension: Secondary | ICD-10-CM | POA: Diagnosis not present

## 2019-12-14 DIAGNOSIS — E538 Deficiency of other specified B group vitamins: Secondary | ICD-10-CM | POA: Diagnosis not present

## 2019-12-14 DIAGNOSIS — Z794 Long term (current) use of insulin: Secondary | ICD-10-CM | POA: Diagnosis not present

## 2019-12-14 DIAGNOSIS — F209 Schizophrenia, unspecified: Secondary | ICD-10-CM | POA: Diagnosis not present

## 2019-12-14 DIAGNOSIS — E1165 Type 2 diabetes mellitus with hyperglycemia: Secondary | ICD-10-CM | POA: Diagnosis not present

## 2019-12-14 DIAGNOSIS — Z6841 Body Mass Index (BMI) 40.0 and over, adult: Secondary | ICD-10-CM | POA: Diagnosis not present

## 2019-12-14 DIAGNOSIS — R635 Abnormal weight gain: Secondary | ICD-10-CM | POA: Diagnosis not present

## 2019-12-14 DIAGNOSIS — G20 Parkinson's disease: Secondary | ICD-10-CM | POA: Diagnosis not present

## 2019-12-14 DIAGNOSIS — R519 Headache, unspecified: Secondary | ICD-10-CM | POA: Diagnosis not present

## 2019-12-14 DIAGNOSIS — I10 Essential (primary) hypertension: Secondary | ICD-10-CM | POA: Diagnosis not present

## 2020-01-07 ENCOUNTER — Telehealth (INDEPENDENT_AMBULATORY_CARE_PROVIDER_SITE_OTHER): Payer: Medicare HMO | Admitting: Psychiatry

## 2020-01-07 ENCOUNTER — Other Ambulatory Visit: Payer: Self-pay

## 2020-01-07 ENCOUNTER — Encounter: Payer: Self-pay | Admitting: Psychiatry

## 2020-01-07 DIAGNOSIS — G2119 Other drug induced secondary parkinsonism: Secondary | ICD-10-CM | POA: Diagnosis not present

## 2020-01-07 DIAGNOSIS — F251 Schizoaffective disorder, depressive type: Secondary | ICD-10-CM

## 2020-01-07 DIAGNOSIS — F5105 Insomnia due to other mental disorder: Secondary | ICD-10-CM | POA: Diagnosis not present

## 2020-01-07 DIAGNOSIS — F4310 Post-traumatic stress disorder, unspecified: Secondary | ICD-10-CM

## 2020-01-07 DIAGNOSIS — F411 Generalized anxiety disorder: Secondary | ICD-10-CM

## 2020-01-07 MED ORDER — AMANTADINE HCL 100 MG PO CAPS: 100.0000 mg | ORAL_CAPSULE | Freq: Every day | ORAL | 2 refills | Status: DC

## 2020-01-07 MED ORDER — FLUOXETINE HCL 40 MG PO CAPS: 40.0000 mg | ORAL_CAPSULE | Freq: Every day | ORAL | 0 refills | Status: DC

## 2020-01-07 NOTE — Progress Notes (Signed)
Virtual Visit via Telephone Note  I connected with Heather Haley on 01/07/20 at  4:20 PM EST by telephone and verified that I am speaking with the correct person using two identifiers.  Location Provider Location : [facility] Patient Location : Home  Participants: Patient , Provider   I discussed the limitations, risks, security and privacy concerns of performing an evaluation and management service by telephone and the availability of in person appointments. I also discussed with the patient that there may be a patient responsible charge related to this service. The patient expressed understanding and agreed to proceed   I discussed the assessment and treatment plan with the patient. The patient was provided an opportunity to ask questions and all were answered. The patient agreed with the plan and demonstrated an understanding of the instructions.   The patient was advised to call back or seek an in-person evaluation if the symptoms worsen or if the condition fails to improve as anticipated.   [place] MD OP Progress Note  01/07/2020 4:40 PM Heather Haley  MRN:  [id number]  Chief Complaint:  Chief Complaint    Follow-up     HPI: Heather Haley is a 59 year old Caucasian female, lives in [place], has a history of schizoaffective disorder, PTSD, GAD, drug-induced Parkinson's disease, headaches, memory problems, diabetes melitis, chronic pain was evaluated by phone today.  Patient today reports she had a good Thanksgiving holiday with her family.  She went out to eat with her son and her daughter.  She reports overall since being on the Prozac she is doing well.  Her anxiety symptoms are under control.  She continues to have auditory hallucinations which are chronic however it does not bother her much.  She reports sleep is good.  She denies any suicidality, homicidality or perceptual disturbances.  She was recently started on levothyroxine and is currently following up  with her primary care provider for management of the thyroid abnormality.  Patient denies any other concerns today.    Visit Diagnosis:    ICD-10-CM   1. Schizoaffective disorder, depressive type ([place])  F25.1 FLUoxetine (PROZAC) 40 MG capsule  2. PTSD (post-traumatic stress disorder)  F43.10 FLUoxetine (PROZAC) 40 MG capsule  3. Generalized anxiety disorder  F41.1 FLUoxetine (PROZAC) 40 MG capsule  4. Insomnia due to mental disorder  F51.05   5. Drug-induced Parkinson's disease ([place])  G21.19 amantadine (SYMMETREL) 100 MG capsule   stable    Past Psychiatric History: I have reviewed past psychiatric history from my progress note on 05/25/2017  Past Medical History:  Past Medical History:  Diagnosis Date  . Anxiety   . Asthma   . Chronic kidney disease   . Chronic pain    previously saw Dr. Consuela Mimes in pain clinic, then saw pain specialist in [place]  . Depression   . Diabetes mellitus ([place])   . Frequency of urination   . GERD (gastroesophageal reflux disease)   . Headache(784.0)   . High cholesterol   . History of kidney stones   . Hypertension   . IBS (irritable bowel syndrome)   . Left ankle instability   . Left knee DJD   . Lumbar Degenerative Disc Disease of  10/11/2014  . Neuromuscular disorder ([place])   . Osteoarthritis of hip (Right) 05/05/2015  . Other enthesopathy of ankle and tarsus 12/15/2009   Qualifier: Diagnosis of  By: Oneida Alar MD, KARL    . Parkinson's disease ([place])   . Peripheral sensory neuropathy (Bilateral) 11/19/2014  .  Postoperative nausea and vomiting   . Schizophrenia [facility])     Past Surgical History:  Procedure Laterality Date  . ABDOMINAL HYSTERECTOMY    . ANKLE SURGERY Left    x 2  . APPENDECTOMY    . CARDIAC CATHETERIZATION    . COLONOSCOPY  2013  . COLONOSCOPY WITH PROPOFOL N/A 04/25/2017   Procedure: COLONOSCOPY WITH PROPOFOL;  Surgeon: Manya Silvas, MD;  Location: [facility] ENDOSCOPY;  Service: Endoscopy;  Laterality: N/A;  .  CYSTOSCOPY/URETEROSCOPY/HOLMIUM LASER/STENT PLACEMENT Left 09/11/2019   Procedure: CYSTOSCOPY/URETEROSCOPY/HOLMIUM LASER/STENT PLACEMENT;  Surgeon: Abbie Sons, MD;  Location: [facility] ORS;  Service: Urology;  Laterality: Left;  . ESOPHAGOGASTRODUODENOSCOPY (EGD) WITH PROPOFOL N/A 10/03/2014   Procedure: ESOPHAGOGASTRODUODENOSCOPY (EGD) WITH PROPOFOL;  Surgeon: Josefine Class, MD;  Location: [facility] ENDOSCOPY;  Service: Endoscopy;  Laterality: N/A;  . ESOPHAGOGASTRODUODENOSCOPY (EGD) WITH PROPOFOL  04/25/2017   Procedure: ESOPHAGOGASTRODUODENOSCOPY (EGD) WITH PROPOFOL;  Surgeon: Manya Silvas, MD;  Location: [facility] ENDOSCOPY;  Service: Endoscopy;;  . JOINT REPLACEMENT Left    knee  . KNEE ARTHROSCOPY  1997   left knee  . LEFT HEART CATH AND CORONARY ANGIOGRAPHY Left 11/10/2017   Procedure: LEFT HEART CATH AND CORONARY ANGIOGRAPHY;  Surgeon: Isaias Cowman, MD;  Location: [place] CV LAB;  Service: Cardiovascular;  Laterality: Left;  . LEFT HEART CATHETERIZATION WITH CORONARY ANGIOGRAM N/A 01/11/2013   Procedure: LEFT HEART CATHETERIZATION WITH CORONARY ANGIOGRAM;  Surgeon: Sinclair Grooms, MD;  Location: [facility] CATH LAB;  Service: Cardiovascular;  Laterality: N/A;  . TOTAL KNEE ARTHROPLASTY  08/30/2011   Procedure: TOTAL KNEE ARTHROPLASTY;  Surgeon: Lorn Junes, MD;  Location: [place];  Service: Orthopedics;  Laterality: Left;    Family Psychiatric History: Reviewed family psychiatric history from my progress note on 05/25/2017  Family History:  Family History  Problem Relation Age of Onset  . Cancer Mother   . Hypertension Father   . Heart disease Father   . Alcohol abuse Father   . Breast cancer Neg Hx     Social History: Reviewed social history from my progress note on 05/25/2017 Social History   Socioeconomic History  . Marital status: Divorced    Spouse name: Not on file  . Number of children: 2  . Years of education: 52  . Highest education level: 11th grade   Occupational History    Employer: [employer]  Tobacco Use  . Smoking status: Former Smoker    Packs/day: 2.00    Years: 15.00    Pack years: 30.00    Types: Cigarettes    Quit date: 02/09/1995    Years since quitting: 24.9  . Smokeless tobacco: Never Used  Vaping Use  . Vaping Use: Never used  Substance and Sexual Activity  . Alcohol use: No    Alcohol/week: 0.0 standard drinks  . Drug use: No  . Sexual activity: Not Currently  Other Topics Concern  . Not on file  Social History Narrative   Patient lives at home alone. Patient works at [employer].   Caffeine daily- 2   Right handed.   Education- 11 th grade   Social Determinants of Health   Financial Resource Strain:   . Difficulty of Paying Living Expenses: Not on file  Food Insecurity:   . Worried About Charity fundraiser in the Last Year: Not on file  . Ran Out of Food in the Last Year: Not on file  Transportation Needs:   . Lack of Transportation (Medical):  Not on file  . Lack of Transportation (Non-Medical): Not on file  Physical Activity:   . Days of Exercise per Week: Not on file  . Minutes of Exercise per Session: Not on file  Stress:   . Feeling of Stress : Not on file  Social Connections:   . Frequency of Communication with Friends and Family: Not on file  . Frequency of Social Gatherings with Friends and Family: Not on file  . Attends Religious Services: Not on file  . Active Member of Clubs or Organizations: Not on file  . Attends Archivist Meetings: Not on file  . Marital Status: Not on file    Allergies:  Allergies  Allergen Reactions  . Prolixin Decanoate [Fluphenazine]     Ineffective   . Benztropine Other (See Comments)    Hair fall out  Suicide thoughts  . Buprenorphine Hcl Other (See Comments)    Unable to void  . Carbidopa-Levodopa Nausea Only  . Codeine Hives  . Cymbalta [Duloxetine Hcl] Other (See Comments)    Altered mental status, Alopecia, visual  hallucinations, nightmares  . Gabapentin Swelling  . Lyrica [Pregabalin] Other (See Comments)    Alopecia, visual hallucinations, nightmares Altered mental status  . Morphine And Related Other (See Comments)    Unable to void  . Nortripytline Hcl [Nortriptyline] Other (See Comments)    Hair loss and night mares   . Nsaids     REACTION: palpitations, diaphoresis  . Penicillins Nausea And Vomiting and Other (See Comments)    REACTION: upset stomach Has patient had a PCN reaction causing immediate rash, facial/tongue/throat swelling, SOB or lightheadedness with hypotension: No Has patient had a PCN reaction causing severe rash involving mucus membranes or skin necrosis: No Has patient had a PCN reaction that required hospitalization: No Has patient had a PCN reaction occurring within the last 10 years: No If all of the above answers are "NO", then may proceed with Cephalosporin use.  . Wellbutrin [Bupropion]     Constipation, mood swings    Metabolic Disorder Labs: Lab Results  Component Value Date   HGBA1C 6.2 (H) 03/06/2017   MPG 131.24 03/06/2017   MPG 108 05/17/2015   Lab Results  Component Value Date   PROLACTIN 64.2 (H) 03/06/2017   Lab Results  Component Value Date   CHOL 146 08/22/2018   TRIG 172 (H) 08/22/2018   HDL 49 08/22/2018   CHOLHDL 3.0 08/22/2018   VLDL 34 08/22/2018   LDLCALC 63 08/22/2018   LDLCALC 59 03/06/2017   Lab Results  Component Value Date   TSH 0.959 08/22/2018   TSH 2.766 08/24/2017    Therapeutic Level Labs: No results found for: LITHIUM No results found for: VALPROATE No components found for:  CBMZ  Current Medications: Current Outpatient Medications  Medication Sig Dispense Refill  . levothyroxine (SYNTHROID) 25 MCG tablet Take by mouth.    Marland Kitchen ACCU-CHEK GUIDE test strip     . acetaminophen (TYLENOL) 500 MG tablet Take 1,000 mg by mouth every 6 (six) hours as needed for moderate pain or headache.     . albuterol (PROVENTIL  HFA;VENTOLIN HFA) 108 (90 Base) MCG/ACT inhaler Inhale 2 puffs into the lungs every 6 (six) hours as needed for wheezing or shortness of breath.    . Alcohol Swabs (B-D SINGLE USE SWABS REGULAR) PADS     . amantadine (SYMMETREL) 100 MG capsule Take 1 capsule (100 mg total) by mouth daily. 30 capsule 2  . aspirin EC  81 MG tablet Take 81 mg by mouth daily. Swallow whole.    . Blood Glucose Calibration (ACCU-CHEK AVIVA) SOLN     . Blood Glucose Monitoring Suppl (ACCU-CHEK AVIVA PLUS) w/Device KIT     . Cholecalciferol (VITAMIN D-3) 125 MCG (5000 UT) TABS Take 5,000 Units by mouth daily.    . clindamycin (CLEOCIN) 300 MG capsule Take 600 mg by mouth See admin instructions. Take 600 mg 1 hour prior to dental work    . Cyanocobalamin (B-12 COMPLIANCE INJECTION IJ) Inject 1 Dose as directed every 30 (thirty) days.    . cyclobenzaprine (FLEXERIL) 5 MG tablet Take 5 mg by mouth 2 (two) times daily.    . diphenhydrAMINE (BENADRYL) 25 MG tablet Take 25 mg by mouth daily as needed for allergies.     Marland Kitchen docusate sodium (COLACE) 50 MG capsule Take 100 mg by mouth 2 (two) times daily.    Marland Kitchen FLUoxetine (PROZAC) 40 MG capsule Take 1 capsule (40 mg total) by mouth daily. 90 capsule 0  . glimepiride (AMARYL) 2 MG tablet Take 2 mg by mouth 2 (two) times daily.     Marland Kitchen HYDROcodone-acetaminophen (NORCO) 7.5-325 MG tablet Take 0.5 tablets by mouth 2 (two) times daily.     . hydroxypropyl methylcellulose / hypromellose (ISOPTO TEARS / GONIOVISC) 2.5 % ophthalmic solution Place 1 drop into both eyes every 4 (four) hours as needed for dry eyes.     . insulin isophane & regular human (NOVOLIN 70/30 FLEXPEN) (70-30) 100 UNIT/ML KwikPen Inject into the skin.    . Insulin Syringe-Needle U-100 (INSULIN SYRINGE 1CC/31GX5/16") 31G X 5/16" 1 ML MISC See admin instructions.    . Lancets Misc. (ACCU-CHEK SOFTCLIX LANCET DEV) KIT Use 1 each 3 (three) times daily Use as instructed.    . metFORMIN (GLUCOPHAGE) 500 MG tablet Take 1 tablet  (500 mg total) by mouth 2 (two) times daily with a meal. For diabetes management (Patient taking differently: Take 1,000 mg by mouth 2 (two) times daily with a meal. For diabetes management) 10 tablet 0  . montelukast (SINGULAIR) 10 MG tablet Take 10 mg by mouth at bedtime.     . nitrofurantoin, macrocrystal-monohydrate, (MACROBID) 100 MG capsule Take 100 mg by mouth 2 (two) times daily.    Marland Kitchen NOVOLIN 70/30 FLEXPEN (70-30) 100 UNIT/ML KwikPen     . OLANZapine (ZYPREXA) 5 MG tablet Take 0.5 tablets (2.5 mg total) by mouth at bedtime. 45 tablet 1  . omeprazole (PRILOSEC) 40 MG capsule Take 40 mg by mouth daily as needed (heartburn).     . ondansetron (ZOFRAN) 4 MG tablet Take 4 mg by mouth every 8 (eight) hours as needed for nausea or vomiting.    Marland Kitchen oxybutynin (DITROPAN) 5 MG tablet 1 tab tid prn frequency,urgency, bladder spasm 30 tablet 0  . pravastatin (PRAVACHOL) 40 MG tablet Take 40 mg by mouth at bedtime.     . predniSONE (STERAPRED UNI-PAK 21 TAB) 10 MG (21) TBPK tablet Take by mouth as directed.    Marland Kitchen RELION INSULIN SYRINGE 31G X 15/64" 1 ML MISC     . rizatriptan (MAXALT) 10 MG tablet Take 10 mg by mouth 2 (two) times daily as needed for migraine. May repeat in 2 hours if needed     . rOPINIRole (REQUIP) 0.25 MG tablet Take 0.5 mg by mouth in the morning and at bedtime.     . torsemide (DEMADEX) 20 MG tablet Take 20 mg by mouth every other day.     Marland Kitchen  traMADol (ULTRAM) 50 MG tablet Take 2 tablets (100 mg total) by mouth 2 (two) times daily for 15 days. (Patient taking differently: Take 50-100 mg by mouth See admin instructions. Take 2 tablets (100 mg) by mouth in the morning, 1 tablet (50 mg) by mouth in the evening, & 1 tablet (50 mg) by mouth at night.) 30 tablet 1  . verapamil (CALAN-SR) 180 MG CR tablet Take 180 mg by mouth at bedtime.     No current facility-administered medications for this visit.     Musculoskeletal: Strength & Muscle Tone: UTA Gait & Station: UTA Patient leans:  N/A  Psychiatric Specialty Exam: Review of Systems  Psychiatric/Behavioral: Positive for hallucinations (Chronic AH - does not bother her). Negative for agitation, behavioral problems, confusion, decreased concentration, self-injury, sleep disturbance and suicidal ideas. The patient is not nervous/anxious.   All other systems reviewed and are negative.   There were no vitals taken for this visit.There is no height or weight on file to calculate BMI.  General Appearance: UTA  Eye Contact:  UTA  Speech:  Clear and Coherent  Volume:  Normal  Mood:  Euthymic  Affect:  UTA  Thought Process:  Goal Directed and Descriptions of Associations: Intact  Orientation:  Full (Time, Place, and Person)  Thought Content: Logical   Suicidal Thoughts:  No  Homicidal Thoughts:  No  Memory:  Immediate;   Fair Recent;   Fair Remote;   Fair  Judgement:  Fair  Insight:  Fair  Psychomotor Activity:  UTA  Concentration:  Concentration: Fair and Attention Span: Fair  Recall:  [employer] of Knowledge: Fair  Language: Fair  Akathisia:  No  Handed:  Right  AIMS (if indicated): UTA  Assets:  Communication Skills Desire for Improvement Housing Social Support  ADL's:  Intact  Cognition: WNL  Sleep:  Fair   Screenings: AIMS     Admission (Discharged) from OP Visit from 08/30/2018 in [place] Admission (Discharged) from 08/22/2018 in [place] [facility] Office Visit from 12/01/2017 in [place] Office Visit from 09/20/2017 in [place] Office Visit from 06/29/2017 in [place] Total Score 0 0 1 0 6    AUDIT     Admission (Discharged) from OP Visit from 08/30/2018 in [place] Admission (Discharged) from 08/22/2018 in [place] [facility] Admission (Discharged) from 03/03/2017 in [place] [facility]  Alcohol Use Disorder Identification Test Final Score (AUDIT) 0 0 0    GAD-7     Office Visit from 08/22/2015 in [place] at [facility]  Total GAD-7 Score 6    PHQ2-9     Office Visit from 03/23/2016 in [place] Office Visit from 08/22/2015 in [place] at [place] from 08/06/2015 in [place] Office Visit from 06/10/2015 in [place] Clinical Support from 05/19/2015 in [place]  PHQ-2 Total Score 0 1 0 0 0       Assessment and Plan: Heather Haley is a 59 year old Caucasian female who has a history of schizoaffective disorder, PTSD, migraine headaches, drug-induced Parkinson's disease was evaluated by phone today.  Patient is currently Stable on current medication regimen.  Plan as noted below.   Plan Schizoaffective disorder-stable Zyprexa at low dosage of 2.5 mg p.o. nightly  PTSD-stable Will monitor closely  GAD-improving Prozac 40 mg p.o. daily  Drug-induced Parkinson's disease-improving Amantadine 100 mg p.o. daily at reduced dosage.  Follow-up in clinic in 6 weeks or sooner if needed.  I have spent atleast 20 minutes non face to face  with patient today. More than 50 % of the time was spent for preparing to see the patient ( e.g., review of test, records ), ordering medications and test ,psychoeducation and supportive psychotherapy and care coordination,as well as documenting clinical information in electronic health record. This note was generated in part or whole with voice recognition software. Voice recognition is usually quite accurate but there are transcription errors that can and very often do occur. I apologize for any typographical errors that were not detected and corrected.        Ursula Alert, MD 01/08/2020, 8:27 AM

## 2020-01-09 ENCOUNTER — Telehealth: Payer: Self-pay

## 2020-01-09 NOTE — Telephone Encounter (Signed)
received a fax that request that a PA was needed for the fluoxetine.

## 2020-01-09 NOTE — Telephone Encounter (Signed)
went online an submitted the PA. - pending

## 2020-01-14 DIAGNOSIS — E538 Deficiency of other specified B group vitamins: Secondary | ICD-10-CM | POA: Diagnosis not present

## 2020-01-14 NOTE — Telephone Encounter (Signed)
received approval notices for fluoxetine hcl 40mg  from 02-09-19 to  02-07-21

## 2020-01-21 ENCOUNTER — Other Ambulatory Visit: Payer: Self-pay | Admitting: Psychiatry

## 2020-01-21 DIAGNOSIS — G2119 Other drug induced secondary parkinsonism: Secondary | ICD-10-CM

## 2020-01-28 ENCOUNTER — Other Ambulatory Visit: Payer: Self-pay | Admitting: Psychiatry

## 2020-01-28 DIAGNOSIS — G2119 Other drug induced secondary parkinsonism: Secondary | ICD-10-CM

## 2020-01-30 ENCOUNTER — Telehealth: Payer: Self-pay

## 2020-01-30 NOTE — Telephone Encounter (Signed)
Medication Management - Telephone call with pt after she left a message her [facility] did not have her order for Amantadine.  Agreed to call them to follow up on order e-scribed 01/07/20 + 2 refills.  Called pt's [facility] to verify they did have Dr. Charlcie Cradle order for Amantadine from 01/07/20 and pharmacist reported they would fill it.  Called patient back to inform and she will call if any further problems.

## 2020-02-14 DIAGNOSIS — E538 Deficiency of other specified B group vitamins: Secondary | ICD-10-CM | POA: Diagnosis not present

## 2020-03-03 ENCOUNTER — Telehealth (INDEPENDENT_AMBULATORY_CARE_PROVIDER_SITE_OTHER): Payer: Medicare HMO | Admitting: Psychiatry

## 2020-03-03 ENCOUNTER — Other Ambulatory Visit: Payer: Self-pay

## 2020-03-03 ENCOUNTER — Encounter: Payer: Self-pay | Admitting: Psychiatry

## 2020-03-03 DIAGNOSIS — F4310 Post-traumatic stress disorder, unspecified: Secondary | ICD-10-CM

## 2020-03-03 DIAGNOSIS — F411 Generalized anxiety disorder: Secondary | ICD-10-CM | POA: Diagnosis not present

## 2020-03-03 DIAGNOSIS — M7918 Myalgia, other site: Secondary | ICD-10-CM | POA: Insufficient documentation

## 2020-03-03 DIAGNOSIS — G5600 Carpal tunnel syndrome, unspecified upper limb: Secondary | ICD-10-CM | POA: Insufficient documentation

## 2020-03-03 DIAGNOSIS — M25539 Pain in unspecified wrist: Secondary | ICD-10-CM | POA: Insufficient documentation

## 2020-03-03 DIAGNOSIS — F251 Schizoaffective disorder, depressive type: Secondary | ICD-10-CM | POA: Diagnosis not present

## 2020-03-03 DIAGNOSIS — M24549 Contracture, unspecified hand: Secondary | ICD-10-CM | POA: Insufficient documentation

## 2020-03-03 DIAGNOSIS — F5105 Insomnia due to other mental disorder: Secondary | ICD-10-CM

## 2020-03-03 DIAGNOSIS — G2119 Other drug induced secondary parkinsonism: Secondary | ICD-10-CM

## 2020-03-03 DIAGNOSIS — M533 Sacrococcygeal disorders, not elsewhere classified: Secondary | ICD-10-CM | POA: Insufficient documentation

## 2020-03-03 MED ORDER — OLANZAPINE 5 MG PO TABS: 5.0000 mg | ORAL_TABLET | Freq: Every day | ORAL | 0 refills | Status: DC

## 2020-03-03 MED ORDER — AMANTADINE HCL 100 MG PO CAPS: 100.0000 mg | ORAL_CAPSULE | Freq: Every day | ORAL | 2 refills | Status: DC

## 2020-03-03 NOTE — Progress Notes (Signed)
Virtual Visit via Telephone Note  I connected with Heather Haley on 03/03/20 at  8:30 AM EST by telephone and verified that I am speaking with the correct person using two identifiers.  Location Provider Location : [facility] Patient Location : Home  Participants: Patient , Provider   I discussed the limitations, risks, security and privacy concerns of performing an evaluation and management service by telephone and the availability of in person appointments. I also discussed with the patient that there may be a patient responsible charge related to this service. The patient expressed understanding and agreed to proceed.    I discussed the assessment and treatment plan with the patient. The patient was provided an opportunity to ask questions and all were answered. The patient agreed with the plan and demonstrated an understanding of the instructions.   The patient was advised to call back or seek an in-person evaluation if the symptoms worsen or if the condition fails to improve as anticipated.   [place] MD OP Progress Note  03/03/2020 12:09 PM Heather Haley  MRN:  [id number]  Chief Complaint:  Chief Complaint    Follow-up     HPI: Heather Haley is a 60 year old Caucasian female, lives in [place], has a history of schizoaffective disorder, PTSD, GAD, drug-induced Parkinson's disease, headaches, memory problems, diabetes melitis, chronic pain was evaluated by telemedicine today.  Patient today reports she is currently struggling with sleep problems.  She reports she struggles with ankle pain, left-sided.  She rates her ankle pain today at 3, 10 being the worst.  She reports her ankle pain disrupts her sleep and she has to get up and walk around.  She currently takes tramadol which helps to some extent.  Patient also reports hallucinations which are auditory.  She reports these are noises and they have been on a regular basis.  She reports she cannot make out what they say  however they can be frustrating.  She reports she just wants it to stop.  She currently takes olanzapine half tablet of 5 mg which helps.  She did not tolerate higher dosages of olanzapine in the past due to her history of muscle spasms tremors and rigidity.  She however is interested in increasing the dosage today if possible.  She wants to give it another trial.  Patient continues to take amantadine for her side effects of olanzapine.  That helps.  Patient denies any suicidality or homicidality.  Patient denies any other concerns today.  Visit Diagnosis:    ICD-10-CM   1. Schizoaffective disorder, depressive type ([place])  F25.1 OLANZapine (ZYPREXA) 5 MG tablet    DISCONTINUED: OLANZapine (ZYPREXA) 5 MG tablet    DISCONTINUED: OLANZapine (ZYPREXA) 5 MG tablet  2. PTSD (post-traumatic stress disorder)  F43.10   3. Generalized anxiety disorder  F41.1   4. Insomnia due to mental disorder  F51.05   5. Drug-induced Parkinson's disease ([place])  G21.19 amantadine (SYMMETREL) 100 MG capsule   stable    Past Psychiatric History: I have reviewed past psychiatric history from my progress note from 05/25/2017.  Past Medical History:  Past Medical History:  Diagnosis Date  . Anxiety   . Asthma   . Chronic kidney disease   . Chronic pain    previously saw Dr. Consuela Mimes in pain clinic, then saw pain specialist in [place]  . Depression   . Diabetes mellitus ([place])   . Frequency of urination   . GERD (gastroesophageal reflux disease)   . Headache(784.0)   .  High cholesterol   . History of kidney stones   . Hypertension   . IBS (irritable bowel syndrome)   . Left ankle instability   . Left knee DJD   . Lumbar Degenerative Disc Disease of  10/11/2014  . Neuromuscular disorder ([place])   . Osteoarthritis of hip (Right) 05/05/2015  . Other enthesopathy of ankle and tarsus 12/15/2009   Qualifier: Diagnosis of  By: Oneida Alar MD, KARL    . Parkinson's disease ([place])   . Peripheral sensory neuropathy  (Bilateral) 11/19/2014  . Postoperative nausea and vomiting   . Schizophrenia [facility])     Past Surgical History:  Procedure Laterality Date  . ABDOMINAL HYSTERECTOMY    . ANKLE SURGERY Left    x 2  . APPENDECTOMY    . CARDIAC CATHETERIZATION    . COLONOSCOPY  2013  . COLONOSCOPY WITH PROPOFOL N/A 04/25/2017   Procedure: COLONOSCOPY WITH PROPOFOL;  Surgeon: Manya Silvas, MD;  Location[facility] ENDOSCOPY;  Service: Endoscopy;  Laterality: N/A;  . CYSTOSCOPY/URETEROSCOPY/HOLMIUM LASER/STENT PLACEMENT Left 09/11/2019   Procedure: CYSTOSCOPY/URETEROSCOPY/HOLMIUM LASER/STENT PLACEMENT;  Surgeon: Abbie Sons, MD;  Location: [facility] ORS;  Service: Urology;  Laterality: Left;  . ESOPHAGOGASTRODUODENOSCOPY (EGD) WITH PROPOFOL N/A 10/03/2014   Procedure: ESOPHAGOGASTRODUODENOSCOPY (EGD) WITH PROPOFOL;  Surgeon: Josefine Class, MD;  Location: [facility] ENDOSCOPY;  Service: Endoscopy;  Laterality: N/A;  . ESOPHAGOGASTRODUODENOSCOPY (EGD) WITH PROPOFOL  04/25/2017   Procedure: ESOPHAGOGASTRODUODENOSCOPY (EGD) WITH PROPOFOL;  Surgeon: Manya Silvas, MD;  Location: [facility] ENDOSCOPY;  Service: Endoscopy;;  . JOINT REPLACEMENT Left    knee  . KNEE ARTHROSCOPY  1997   left knee  . LEFT HEART CATH AND CORONARY ANGIOGRAPHY Left 11/10/2017   Procedure: LEFT HEART CATH AND CORONARY ANGIOGRAPHY;  Surgeon: Isaias Cowman, MD;  Location: [facility] CV LAB;  Service: Cardiovascular;  Laterality: Left;  . LEFT HEART CATHETERIZATION WITH CORONARY ANGIOGRAM N/A 01/11/2013   Procedure: LEFT HEART CATHETERIZATION WITH CORONARY ANGIOGRAM;  Surgeon: Sinclair Grooms, MD;  Location[facility] CATH LAB;  Service: Cardiovascular;  Laterality: N/A;  . TOTAL KNEE ARTHROPLASTY  08/30/2011   Procedure: TOTAL KNEE ARTHROPLASTY;  Surgeon: Lorn Junes, MD;  Location: [facility];  Service: Orthopedics;  Laterality: Left;    Family Psychiatric History: I have reviewed family psychiatric history from my progress note on  05/25/2017.  Family History:  Family History  Problem Relation Age of Onset  . Cancer Mother   . Hypertension Father   . Heart disease Father   . Alcohol abuse Father   . Breast cancer Neg Hx     Social History: I have reviewed social history from my progress note on 05/25/2017. Social History   Socioeconomic History  . Marital status: Divorced    Spouse name: Not on file  . Number of children: 2  . Years of education: 46  . Highest education level: 11th grade  Occupational History    Employer: [employer]  Tobacco Use  . Smoking status: Former Smoker    Packs/day: 2.00    Years: 15.00    Pack years: 30.00    Types: Cigarettes    Quit date: 02/09/1995    Years since quitting: 25.0  . Smokeless tobacco: Never Used  Vaping Use  . Vaping Use: Never used  Substance and Sexual Activity  . Alcohol use: No    Alcohol/week: 0.0 standard drinks  . Drug use: No  . Sexual activity: Not Currently  Other Topics Concern  . Not on file  Social History Narrative   Patient lives at home alone. Patient works at [employer].   Caffeine daily- 2   Right handed.   Education- 11 th grade   Social Determinants of Radio broadcast assistant Strain: Not on file  Food Insecurity: Not on file  Transportation Needs: Not on file  Physical Activity: Not on file  Stress: Not on file  Social Connections: Not on file    Allergies:  Allergies  Allergen Reactions  . Prolixin Decanoate [Fluphenazine]     Ineffective   . Benztropine Other (See Comments)    Hair fall out  Suicide thoughts  . Benztropine Mesylate Other (See Comments)  . Buprenorphine Hcl Other (See Comments)    Unable to void  . Carbidopa-Levodopa Nausea Only  . Codeine Hives  . Cymbalta [Duloxetine Hcl] Other (See Comments)    Altered mental status, Alopecia, visual hallucinations, nightmares  . Gabapentin Swelling  . Lyrica [Pregabalin] Other (See Comments)    Alopecia, visual hallucinations,  nightmares Altered mental status  . Morphine And Related Other (See Comments)    Unable to void  . Nortripytline Hcl [Nortriptyline] Other (See Comments)    Hair loss and night mares   . Nsaids     REACTION: palpitations, diaphoresis  . Penicillins Nausea And Vomiting and Other (See Comments)    REACTION: upset stomach Has patient had a PCN reaction causing immediate rash, facial/tongue/throat swelling, SOB or lightheadedness with hypotension: No Has patient had a PCN reaction causing severe rash involving mucus membranes or skin necrosis: No Has patient had a PCN reaction that required hospitalization: No Has patient had a PCN reaction occurring within the last 10 years: No If all of the above answers are "NO", then may proceed with Cephalosporin use.  . Wellbutrin [Bupropion]     Constipation, mood swings    Metabolic Disorder Labs: Lab Results  Component Value Date   HGBA1C 6.2 (H) 03/06/2017   MPG 131.24 03/06/2017   MPG 108 05/17/2015   Lab Results  Component Value Date   PROLACTIN 64.2 (H) 03/06/2017   Lab Results  Component Value Date   CHOL 146 08/22/2018   TRIG 172 (H) 08/22/2018   HDL 49 08/22/2018   CHOLHDL 3.0 08/22/2018   VLDL 34 08/22/2018   LDLCALC 63 08/22/2018   LDLCALC 59 03/06/2017   Lab Results  Component Value Date   TSH 0.959 08/22/2018   TSH 2.766 08/24/2017    Therapeutic Level Labs: No results found for: LITHIUM No results found for: VALPROATE No components found for:  CBMZ  Current Medications: Current Outpatient Medications  Medication Sig Dispense Refill  . levothyroxine (SYNTHROID) 50 MCG tablet Take by mouth.    Marland Kitchen ACCU-CHEK GUIDE test strip     . acetaminophen (TYLENOL) 500 MG tablet Take 1,000 mg by mouth every 6 (six) hours as needed for moderate pain or headache.     . albuterol (PROVENTIL HFA;VENTOLIN HFA) 108 (90 Base) MCG/ACT inhaler Inhale 2 puffs into the lungs every 6 (six) hours as needed for wheezing or shortness of  breath.    . Alcohol Swabs (B-D SINGLE USE SWABS REGULAR) PADS     . amantadine (SYMMETREL) 100 MG capsule Take 1 capsule (100 mg total) by mouth daily. 30 capsule 2  . aspirin EC 81 MG tablet Take 81 mg by mouth daily. Swallow whole.    . Blood Glucose Calibration (ACCU-CHEK AVIVA) SOLN     . Blood Glucose Monitoring Suppl (ACCU-CHEK AVIVA PLUS)  w/Device KIT     . Cholecalciferol (VITAMIN D-3) 125 MCG (5000 UT) TABS Take 5,000 Units by mouth daily.    . clindamycin (CLEOCIN) 300 MG capsule Take 600 mg by mouth See admin instructions. Take 600 mg 1 hour prior to dental work    . Cyanocobalamin (B-12 COMPLIANCE INJECTION IJ) Inject 1 Dose as directed every 30 (thirty) days.    . cyclobenzaprine (FLEXERIL) 5 MG tablet Take 5 mg by mouth 2 (two) times daily.    . diphenhydrAMINE (BENADRYL) 25 MG tablet Take 25 mg by mouth daily as needed for allergies.     Marland Kitchen docusate sodium (COLACE) 50 MG capsule Take 100 mg by mouth 2 (two) times daily.    Marland Kitchen FLUCELVAX QUADRIVALENT 0.5 ML injection     . FLUoxetine (PROZAC) 40 MG capsule Take 1 capsule (40 mg total) by mouth daily. 90 capsule 0  . furosemide (LASIX) 20 MG tablet     . glimepiride (AMARYL) 2 MG tablet Take 2 mg by mouth 2 (two) times daily.     Marland Kitchen HYDROcodone-acetaminophen (NORCO) 7.5-325 MG tablet Take 0.5 tablets by mouth 2 (two) times daily.     . hydroxypropyl methylcellulose / hypromellose (ISOPTO TEARS / GONIOVISC) 2.5 % ophthalmic solution Place 1 drop into both eyes every 4 (four) hours as needed for dry eyes.     . insulin isophane & regular human (NOVOLIN 70/30 FLEXPEN) (70-30) 100 UNIT/ML KwikPen Inject into the skin.    . Insulin Syringe-Needle U-100 (INSULIN SYRINGE 1CC/31GX5/16") 31G X 5/16" 1 ML MISC See admin instructions.    . Lancets Misc. (ACCU-CHEK SOFTCLIX LANCET DEV) KIT Use 1 each 3 (three) times daily Use as instructed.    Marland Kitchen levothyroxine (SYNTHROID) 25 MCG tablet Take by mouth.    . metFORMIN (GLUCOPHAGE) 500 MG tablet Take  1 tablet (500 mg total) by mouth 2 (two) times daily with a meal. For diabetes management (Patient taking differently: Take 1,000 mg by mouth 2 (two) times daily with a meal. For diabetes management) 10 tablet 0  . montelukast (SINGULAIR) 10 MG tablet Take 10 mg by mouth at bedtime.     . nitrofurantoin, macrocrystal-monohydrate, (MACROBID) 100 MG capsule Take 100 mg by mouth 2 (two) times daily.    Marland Kitchen NOVOLIN 70/30 FLEXPEN (70-30) 100 UNIT/ML KwikPen     . OLANZapine (ZYPREXA) 5 MG tablet Take 1 tablet (5 mg total) by mouth at bedtime. 90 tablet 0  . omeprazole (PRILOSEC) 40 MG capsule Take 40 mg by mouth daily as needed (heartburn).     . ondansetron (ZOFRAN) 4 MG tablet Take 4 mg by mouth every 8 (eight) hours as needed for nausea or vomiting.    Marland Kitchen oxybutynin (DITROPAN) 5 MG tablet 1 tab tid prn frequency,urgency, bladder spasm 30 tablet 0  . pravastatin (PRAVACHOL) 40 MG tablet Take 40 mg by mouth at bedtime.     . predniSONE (STERAPRED UNI-PAK 21 TAB) 10 MG (21) TBPK tablet Take by mouth as directed.    Marland Kitchen RELION INSULIN SYRINGE 31G X 15/64" 1 ML MISC     . rizatriptan (MAXALT) 10 MG tablet Take 10 mg by mouth 2 (two) times daily as needed for migraine. May repeat in 2 hours if needed     . rOPINIRole (REQUIP) 0.25 MG tablet Take 0.5 mg by mouth in the morning and at bedtime.     . torsemide (DEMADEX) 20 MG tablet Take 20 mg by mouth every other day.     Marland Kitchen  traMADol (ULTRAM) 50 MG tablet Take 2 tablets (100 mg total) by mouth 2 (two) times daily for 15 days. (Patient taking differently: Take 50-100 mg by mouth See admin instructions. Take 2 tablets (100 mg) by mouth in the morning, 1 tablet (50 mg) by mouth in the evening, & 1 tablet (50 mg) by mouth at night.) 30 tablet 1  . verapamil (CALAN-SR) 180 MG CR tablet Take 180 mg by mouth at bedtime.     No current facility-administered medications for this visit.     Musculoskeletal: Strength & Muscle Tone: UTA Gait & Station: UTA Patient  leans: N/A  Psychiatric Specialty Exam: Review of Systems  Musculoskeletal:       Ankle pain- left sided - 3/10  Psychiatric/Behavioral: Positive for hallucinations and sleep disturbance.  All other systems reviewed and are negative.   There were no vitals taken for this visit.There is no height or weight on file to calculate BMI.  General Appearance: UTA  Eye Contact:  UTA  Speech:  Clear and Coherent  Volume:  Normal  Mood:  Anxious  Affect:  UTA  Thought Process:  Goal Directed and Descriptions of Associations: Intact  Orientation:  Full (Time, Place, and Person)  Thought Content: Hallucinations: Auditory Noises, can be frustrating  Suicidal Thoughts:  No  Homicidal Thoughts:  No  Memory:  Immediate;   Fair Recent;   Fair Remote;   Fair  Judgement:  Fair  Insight:  Fair  Psychomotor Activity:  UTA  Concentration:  Concentration: Fair and Attention Span: Fair  Recall:  [employer] of Knowledge: Fair  Language: Fair  Akathisia:  No  Handed:  Right  AIMS (if indicated): UTA  Assets:  Communication Skills Desire for Improvement Housing Social Support  ADL's:  Intact  Cognition: WNL  Sleep:  Restless, ankle pain at night , hx of surgery in 2008   Screenings: [place] Admission (Discharged) from OP Visit from 08/30/2018 in [place] Admission (Discharged) from 08/22/2018 in [facility] [facility] Office Visit from 12/01/2017 in [place] Office Visit from 09/20/2017 in [place] Office Visit from 06/29/2017 in [facility] Total Score 0 0 1 0 6    AUDIT   Flowsheet Row Admission (Discharged) from OP Visit from 08/30/2018 in [place] Admission (Discharged) from 08/22/2018 in [facility] [facility] [facility] Admission (Discharged) from 03/03/2017 in [facility] [place]  [facility]  Alcohol Use Disorder Identification Test Final Score (AUDIT) 0 0 0    GAD-7   Flowsheet Row Office Visit from 08/22/2015 in [place] at [facility]  Total GAD-7 Score 6    [place] Office Visit from 03/23/2016 in [place] Office Visit from 08/22/2015 in [place] [facility] [place] from 08/06/2015 in [place] Office Visit from 06/10/2015 in [place] Clinical Support from 05/19/2015 in [place]  PHQ-2 Total Score 0 1 0 0 0       Assessment and Plan: Heather Haley is a 60 year old Caucasian female who has a history of schizoaffective disorder, PTSD, migraine headaches, drug-induced Parkinson's disease was evaluated by telemedicine today.  Patient is currently struggling with auditory hallucinations, sleep problems.  Patient does have physical complaints of pain.  Discussed plan as noted below.  Plan Schizoaffective disorder-unstable Increase Zyprexa to 5 mg p.o. nightly. Discussed with patient she could take 1/2 tablet some nights if she believes she has side effects.  She will monitor her symptoms and let writer know.  PTSD-stable We will monitor closely  GAD-improving Prozac 40 mg p.o. daily  Drug-induced Parkinson's disease-stable Amantadine 100 mg p.o. daily at reduced dosage.   Insomnia-unstable Her sleep problems are also due to her pain. The Zyprexa increased dosage will help. Patient also advised to contact her providers for pain management.  She will benefit from sufficient pain management.  Follow-up in clinic in 2 to 3 weeks or sooner if needed.  I have spent atleast 20 minutes non face to face  with patient today. More than 50 % of the time was spent for preparing to see the patient ( e.g., review of test, records ), ordering medications and test  ,psychoeducation and supportive psychotherapy and care coordination,as well as documenting clinical information in electronic health record. This note was generated in part or whole with voice recognition software. Voice recognition is usually quite accurate but there are transcription errors that can and very often do occur. I apologize for any typographical errors that were not detected and corrected.      Ursula Alert, MD 03/03/2020, 12:09 PM

## 2020-03-05 ENCOUNTER — Other Ambulatory Visit: Payer: Self-pay | Admitting: Internal Medicine

## 2020-03-05 DIAGNOSIS — R221 Localized swelling, mass and lump, neck: Secondary | ICD-10-CM

## 2020-03-05 DIAGNOSIS — F209 Schizophrenia, unspecified: Secondary | ICD-10-CM | POA: Diagnosis not present

## 2020-03-05 DIAGNOSIS — Z794 Long term (current) use of insulin: Secondary | ICD-10-CM | POA: Diagnosis not present

## 2020-03-05 DIAGNOSIS — J4530 Mild persistent asthma, uncomplicated: Secondary | ICD-10-CM | POA: Diagnosis not present

## 2020-03-05 DIAGNOSIS — E669 Obesity, unspecified: Secondary | ICD-10-CM | POA: Diagnosis not present

## 2020-03-05 DIAGNOSIS — E119 Type 2 diabetes mellitus without complications: Secondary | ICD-10-CM | POA: Diagnosis not present

## 2020-03-05 DIAGNOSIS — J4540 Moderate persistent asthma, uncomplicated: Secondary | ICD-10-CM | POA: Diagnosis not present

## 2020-03-05 DIAGNOSIS — G20 Parkinson's disease: Secondary | ICD-10-CM | POA: Diagnosis not present

## 2020-03-05 DIAGNOSIS — E1165 Type 2 diabetes mellitus with hyperglycemia: Secondary | ICD-10-CM | POA: Diagnosis not present

## 2020-03-05 DIAGNOSIS — R0600 Dyspnea, unspecified: Secondary | ICD-10-CM | POA: Diagnosis not present

## 2020-03-05 DIAGNOSIS — E039 Hypothyroidism, unspecified: Secondary | ICD-10-CM | POA: Diagnosis not present

## 2020-03-05 DIAGNOSIS — I10 Essential (primary) hypertension: Secondary | ICD-10-CM | POA: Diagnosis not present

## 2020-03-05 DIAGNOSIS — Z6841 Body Mass Index (BMI) 40.0 and over, adult: Secondary | ICD-10-CM | POA: Diagnosis not present

## 2020-03-12 ENCOUNTER — Other Ambulatory Visit: Payer: Self-pay

## 2020-03-12 ENCOUNTER — Ambulatory Visit
Admission: RE | Admit: 2020-03-12 | Discharge: 2020-03-12 | Disposition: A | Discharge status: Home / Self Care | Payer: Medicare HMO | Source: Ambulatory Visit | Attending: Internal Medicine | Admitting: Internal Medicine

## 2020-03-12 DIAGNOSIS — R221 Localized swelling, mass and lump, neck: Secondary | ICD-10-CM | POA: Diagnosis not present

## 2020-03-14 ENCOUNTER — Telehealth (INDEPENDENT_AMBULATORY_CARE_PROVIDER_SITE_OTHER): Payer: Medicare HMO | Admitting: Psychiatry

## 2020-03-14 ENCOUNTER — Other Ambulatory Visit: Payer: Self-pay

## 2020-03-14 ENCOUNTER — Encounter: Payer: Self-pay | Admitting: Psychiatry

## 2020-03-14 ENCOUNTER — Other Ambulatory Visit: Payer: Self-pay | Admitting: Internal Medicine

## 2020-03-14 DIAGNOSIS — G4701 Insomnia due to medical condition: Secondary | ICD-10-CM

## 2020-03-14 DIAGNOSIS — E65 Localized adiposity: Secondary | ICD-10-CM

## 2020-03-14 DIAGNOSIS — F4310 Post-traumatic stress disorder, unspecified: Secondary | ICD-10-CM | POA: Diagnosis not present

## 2020-03-14 DIAGNOSIS — G2119 Other drug induced secondary parkinsonism: Secondary | ICD-10-CM

## 2020-03-14 DIAGNOSIS — F251 Schizoaffective disorder, depressive type: Secondary | ICD-10-CM | POA: Diagnosis not present

## 2020-03-14 DIAGNOSIS — F411 Generalized anxiety disorder: Secondary | ICD-10-CM | POA: Diagnosis not present

## 2020-03-14 MED ORDER — FLUOXETINE HCL 40 MG PO CAPS: 40.0000 mg | ORAL_CAPSULE | Freq: Every day | ORAL | 0 refills | Status: DC

## 2020-03-14 NOTE — Progress Notes (Signed)
Virtual Visit via Telephone Note  I connected with Heather Haley on 03/14/20 at 11:40 AM EST by telephone and verified that I am speaking with the correct person using two identifiers.  Location Provider Location : [facility] Patient Location : Home  Participants: Patient , Provider   I discussed the limitations, risks, security and privacy concerns of performing an evaluation and management service by telephone and the availability of in person appointments. I also discussed with the patient that there may be a patient responsible charge related to this service. The patient expressed understanding and agreed to proceed.  I discussed the assessment and treatment plan with the patient. The patient was provided an opportunity to ask questions and all were answered. The patient agreed with the plan and demonstrated an understanding of the instructions.  The patient was advised to call back or seek an in-person evaluation if the symptoms worsen or if the condition fails to improve as anticipated.    [place] MD OP Progress Note  03/14/2020 12:24 PM Heather Haley  MRN:  [id number]  Chief Complaint:  Chief Complaint    Follow-up     HPI: Heather Haley is a 60 year old Caucasian female, lives in [place], has a history of schizoaffective disorder, PTSD, GAD, drug-induced Parkinson's disease, headaches, memory problems, diabetes melitis, chronic pain was evaluated by telemedicine today.  Patient today reports her hallucinations have improved.  She reports she continues to hear noises, static noises however she is able to cope with it better. The Olanzapine higher dosage is beneficial.  She currently takes the higher dosage every other day or so.  She currently denies any side effects .  Patient reports she does have psychosocial stressors of health problems.  She was diagnosed with right-sided swelling of her neck.  She reports she has upcoming ultrasound scheduled for further evaluation.   I have reviewed notes per Dr. Ginette Pitman dated 03/05/2020-'swelling of right side of neck, related to parotid-recommend ultrasound.'  Patient reports she does feel anxious about her current situational stressors, medical problems however she has been coping okay.  Patient does report sleep problems.  She has difficulty maintaining sleep.  She is restless often.  She reports she wakes up several times to urinate and has difficulty falling back asleep.  She also reports she has left-sided ankle pain, has a history of chronic pain, currently on pain medications however continues to struggle with it.  She reports she does take naps during the day when her sleep is restless at night.  Patient denies any suicidality or homicidality.  Patient denies any other concerns today.  Visit Diagnosis:    ICD-10-CM   1. Schizoaffective disorder, depressive type ([place])  F25.1 FLUoxetine (PROZAC) 40 MG capsule  2. PTSD (post-traumatic stress disorder)  F43.10 FLUoxetine (PROZAC) 40 MG capsule  3. Generalized anxiety disorder  F41.1 FLUoxetine (PROZAC) 40 MG capsule  4. Insomnia due to medical condition  G47.01    frequent urination  5. Drug-induced Parkinson's disease ([place])  G21.19     Past Psychiatric History: I have reviewed past psychiatric history from my progress note on 05/25/2017.  Past Medical History:  Past Medical History:  Diagnosis Date  . Anxiety   . Asthma   . Chronic kidney disease   . Chronic pain    previously saw Dr. Consuela Mimes in pain clinic, then saw pain specialist in [place]  . Depression   . Diabetes mellitus ([place])   . Frequency of urination   . GERD (gastroesophageal reflux  disease)   . Headache(784.0)   . High cholesterol   . History of kidney stones   . Hypertension   . IBS (irritable bowel syndrome)   . Left ankle instability   . Left knee DJD   . Lumbar Degenerative Disc Disease of  10/11/2014  . Neuromuscular disorder ([place])   . Osteoarthritis of hip (Right) 05/05/2015  .  Other enthesopathy of ankle and tarsus 12/15/2009   Qualifier: Diagnosis of  By: Oneida Alar MD, KARL    . Parkinson's disease ([place])   . Peripheral sensory neuropathy (Bilateral) 11/19/2014  . Postoperative nausea and vomiting   . Schizophrenia [facility])     Past Surgical History:  Procedure Laterality Date  . ABDOMINAL HYSTERECTOMY    . ANKLE SURGERY Left    x 2  . APPENDECTOMY    . CARDIAC CATHETERIZATION    . COLONOSCOPY  2013  . COLONOSCOPY WITH PROPOFOL N/A 04/25/2017   Procedure: COLONOSCOPY WITH PROPOFOL;  Surgeon: Manya Silvas, MD;  Location: [facility] ENDOSCOPY;  Service: Endoscopy;  Laterality: N/A;  . CYSTOSCOPY/URETEROSCOPY/HOLMIUM LASER/STENT PLACEMENT Left 09/11/2019   Procedure: CYSTOSCOPY/URETEROSCOPY/HOLMIUM LASER/STENT PLACEMENT;  Surgeon: Abbie Sons, MD;  Location: [facility] ORS;  Service: Urology;  Laterality: Left;  . ESOPHAGOGASTRODUODENOSCOPY (EGD) WITH PROPOFOL N/A 10/03/2014   Procedure: ESOPHAGOGASTRODUODENOSCOPY (EGD) WITH PROPOFOL;  Surgeon: Josefine Class, MD;  [facility]COPY;  Service: Endoscopy;  Laterality: N/A;  . ESOPHAGOGASTRODUODENOSCOPY (EGD) WITH PROPOFOL  04/25/2017   Procedure: ESOPHAGOGASTRODUODENOSCOPY (EGD) WITH PROPOFOL;  Surgeon: Manya Silvas, MD;  Location: [facility] ENDOSCOPY;  Service: Endoscopy;;  . JOINT REPLACEMENT Left    knee  . KNEE ARTHROSCOPY  1997   left knee  . LEFT HEART CATH AND CORONARY ANGIOGRAPHY Left 11/10/2017   Procedure: LEFT HEART CATH AND CORONARY ANGIOGRAPHY;  Surgeon: Isaias Cowman, MD;  Location: [place] CV LAB;  Service: Cardiovascular;  Laterality: Left;  . LEFT HEART CATHETERIZATION WITH CORONARY ANGIOGRAM N/A 01/11/2013   Procedure: LEFT HEART CATHETERIZATION WITH CORONARY ANGIOGRAM;  Surgeon: Sinclair Grooms, MD;  Location[facility] CATH LAB;  Service: Cardiovascular;  Laterality: N/A;  . TOTAL KNEE ARTHROPLASTY  08/30/2011   Procedure: TOTAL KNEE ARTHROPLASTY;  Surgeon: Lorn Junes, MD;  Location:  [place];  Service: Orthopedics;  Laterality: Left;    Family Psychiatric History: I have reviewed family psychiatric history from my progress note on 05/25/2017.  Family History:  Family History  Problem Relation Age of Onset  . Cancer Mother   . Hypertension Father   . Heart disease Father   . Alcohol abuse Father   . Breast cancer Neg Hx     Social History: I have reviewed social history from my progress note on 05/25/2017. Social History   Socioeconomic History  . Marital status: Divorced    Spouse name: Not on file  . Number of children: 2  . Years of education: 39  . Highest education level: 11th grade  Occupational History    Employer: [employer]  Tobacco Use  . Smoking status: Former Smoker    Packs/day: 2.00    Years: 15.00    Pack years: 30.00    Types: Cigarettes    Quit date: 02/09/1995    Years since quitting: 25.1  . Smokeless tobacco: Never Used  Vaping Use  . Vaping Use: Never used  Substance and Sexual Activity  . Alcohol use: No    Alcohol/week: 0.0 standard drinks  . Drug use: No  . Sexual activity: Not Currently  Other Topics Concern  . Not on file  Social History Narrative   Patient lives at home alone. Patient works at [employer].   Caffeine daily- 2   Right handed.   Education- 11 th grade   Social Determinants of Radio broadcast assistant Strain: Not on file  Food Insecurity: Not on file  Transportation Needs: Not on file  Physical Activity: Not on file  Stress: Not on file  Social Connections: Not on file    Allergies:  Allergies  Allergen Reactions  . Prolixin Decanoate [Fluphenazine]     Ineffective   . Benztropine Other (See Comments)    Hair fall out  Suicide thoughts  . Benztropine Mesylate Other (See Comments)  . Buprenorphine Hcl Other (See Comments)    Unable to void  . Carbidopa-Levodopa Nausea Only  . Codeine Hives  . Cymbalta [Duloxetine Hcl] Other (See Comments)    Altered mental status, Alopecia, visual  hallucinations, nightmares  . Gabapentin Swelling  . Lyrica [Pregabalin] Other (See Comments)    Alopecia, visual hallucinations, nightmares Altered mental status  . Morphine And Related Other (See Comments)    Unable to void  . Nortripytline Hcl [Nortriptyline] Other (See Comments)    Hair loss and night mares   . Nsaids     REACTION: palpitations, diaphoresis  . Penicillins Nausea And Vomiting and Other (See Comments)    REACTION: upset stomach Has patient had a PCN reaction causing immediate rash, facial/tongue/throat swelling, SOB or lightheadedness with hypotension: No Has patient had a PCN reaction causing severe rash involving mucus membranes or skin necrosis: No Has patient had a PCN reaction that required hospitalization: No Has patient had a PCN reaction occurring within the last 10 years: No If all of the above answers are "NO", then may proceed with Cephalosporin use.  . Wellbutrin [Bupropion]     Constipation, mood swings    Metabolic Disorder Labs: Lab Results  Component Value Date   HGBA1C 6.2 (H) 03/06/2017   MPG 131.24 03/06/2017   MPG 108 05/17/2015   Lab Results  Component Value Date   PROLACTIN 64.2 (H) 03/06/2017   Lab Results  Component Value Date   CHOL 146 08/22/2018   TRIG 172 (H) 08/22/2018   HDL 49 08/22/2018   CHOLHDL 3.0 08/22/2018   VLDL 34 08/22/2018   LDLCALC 63 08/22/2018   LDLCALC 59 03/06/2017   Lab Results  Component Value Date   TSH 0.959 08/22/2018   TSH 2.766 08/24/2017    Therapeutic Level Labs: No results found for: LITHIUM No results found for: VALPROATE No components found for:  CBMZ  Current Medications: Current Outpatient Medications  Medication Sig Dispense Refill  . budesonide-formoterol (SYMBICORT) 160-4.5 MCG/ACT inhaler Inhale into the lungs.    Marland Kitchen ACCU-CHEK GUIDE test strip     . acetaminophen (TYLENOL) 500 MG tablet Take 1,000 mg by mouth every 6 (six) hours as needed for moderate pain or headache.     .  albuterol (PROVENTIL HFA;VENTOLIN HFA) 108 (90 Base) MCG/ACT inhaler Inhale 2 puffs into the lungs every 6 (six) hours as needed for wheezing or shortness of breath.    . Alcohol Swabs (B-D SINGLE USE SWABS REGULAR) PADS     . amantadine (SYMMETREL) 100 MG capsule Take 1 capsule (100 mg total) by mouth daily. 30 capsule 2  . aspirin EC 81 MG tablet Take 81 mg by mouth daily. Swallow whole.    . Blood Glucose Calibration (ACCU-CHEK AVIVA) SOLN     .  Blood Glucose Monitoring Suppl (ACCU-CHEK AVIVA PLUS) w/Device KIT     . Cholecalciferol (VITAMIN D-3) 125 MCG (5000 UT) TABS Take 5,000 Units by mouth daily.    . clindamycin (CLEOCIN) 300 MG capsule Take 600 mg by mouth See admin instructions. Take 600 mg 1 hour prior to dental work    . Cyanocobalamin (B-12 COMPLIANCE INJECTION IJ) Inject 1 Dose as directed every 30 (thirty) days.    . cyclobenzaprine (FLEXERIL) 5 MG tablet Take 5 mg by mouth 2 (two) times daily.    . diphenhydrAMINE (BENADRYL) 25 MG tablet Take 25 mg by mouth daily as needed for allergies.     Marland Kitchen docusate sodium (COLACE) 50 MG capsule Take 100 mg by mouth 2 (two) times daily.    Marland Kitchen FLUCELVAX QUADRIVALENT 0.5 ML injection     . FLUoxetine (PROZAC) 40 MG capsule Take 1 capsule (40 mg total) by mouth daily. 90 capsule 0  . furosemide (LASIX) 20 MG tablet     . glimepiride (AMARYL) 2 MG tablet Take 2 mg by mouth 2 (two) times daily.     Marland Kitchen HYDROcodone-acetaminophen (NORCO) 7.5-325 MG tablet Take 0.5 tablets by mouth 2 (two) times daily.     . hydroxypropyl methylcellulose / hypromellose (ISOPTO TEARS / GONIOVISC) 2.5 % ophthalmic solution Place 1 drop into both eyes every 4 (four) hours as needed for dry eyes.     . insulin isophane & regular human (NOVOLIN 70/30 FLEXPEN) (70-30) 100 UNIT/ML KwikPen Inject into the skin.    . Insulin Syringe-Needle U-100 (INSULIN SYRINGE 1CC/31GX5/16") 31G X 5/16" 1 ML MISC See admin instructions.    . Lancets Misc. (ACCU-CHEK SOFTCLIX LANCET DEV) KIT Use  1 each 3 (three) times daily Use as instructed.    Marland Kitchen levothyroxine (SYNTHROID) 25 MCG tablet Take by mouth.    . levothyroxine (SYNTHROID) 50 MCG tablet Take by mouth.    . metFORMIN (GLUCOPHAGE) 500 MG tablet Take 1 tablet (500 mg total) by mouth 2 (two) times daily with a meal. For diabetes management (Patient taking differently: Take 1,000 mg by mouth 2 (two) times daily with a meal. For diabetes management) 10 tablet 0  . montelukast (SINGULAIR) 10 MG tablet Take 10 mg by mouth at bedtime.     . nitrofurantoin, macrocrystal-monohydrate, (MACROBID) 100 MG capsule Take 100 mg by mouth 2 (two) times daily.    Marland Kitchen NOVOLIN 70/30 FLEXPEN (70-30) 100 UNIT/ML KwikPen     . OLANZapine (ZYPREXA) 5 MG tablet Take 1 tablet (5 mg total) by mouth at bedtime. 90 tablet 0  . omeprazole (PRILOSEC) 40 MG capsule Take 40 mg by mouth daily as needed (heartburn).     . ondansetron (ZOFRAN) 4 MG tablet Take 4 mg by mouth every 8 (eight) hours as needed for nausea or vomiting.    Marland Kitchen oxybutynin (DITROPAN) 5 MG tablet 1 tab tid prn frequency,urgency, bladder spasm 30 tablet 0  . pravastatin (PRAVACHOL) 40 MG tablet Take 40 mg by mouth at bedtime.     . predniSONE (STERAPRED UNI-PAK 21 TAB) 10 MG (21) TBPK tablet Take by mouth as directed.    Marland Kitchen RELION INSULIN SYRINGE 31G X 15/64" 1 ML MISC     . rizatriptan (MAXALT) 10 MG tablet Take 10 mg by mouth 2 (two) times daily as needed for migraine. May repeat in 2 hours if needed     . rOPINIRole (REQUIP) 0.25 MG tablet Take 0.5 mg by mouth in the morning and at bedtime.     Marland Kitchen  torsemide (DEMADEX) 20 MG tablet Take 20 mg by mouth every other day.     . traMADol (ULTRAM) 50 MG tablet Take 2 tablets (100 mg total) by mouth 2 (two) times daily for 15 days. (Patient taking differently: Take 50-100 mg by mouth See admin instructions. Take 2 tablets (100 mg) by mouth in the morning, 1 tablet (50 mg) by mouth in the evening, & 1 tablet (50 mg) by mouth at night.) 30 tablet 1  . verapamil  (CALAN-SR) 180 MG CR tablet Take 180 mg by mouth at bedtime.     No current facility-administered medications for this visit.     Musculoskeletal: Strength & Muscle Tone: UTA Gait & Station: UTA Patient leans: N/A  Psychiatric Specialty Exam: Review of Systems  HENT:       Rt,sided neck swelling  Musculoskeletal:       Left sided ankle pain  Psychiatric/Behavioral: Positive for hallucinations and sleep disturbance. The patient is nervous/anxious.   All other systems reviewed and are negative.   There were no vitals taken for this visit.There is no height or weight on file to calculate BMI.  General Appearance: UTA  Eye Contact:  UTA  Speech:  Clear and Coherent  Volume:  Normal  Mood:  Anxious  Affect:  UTA  Thought Process:  Goal Directed and Descriptions of Associations: Intact  Orientation:  Full (Time, Place, and Person)  Thought Content: Hallucinations: Auditory noises-improving  Suicidal Thoughts:  No  Homicidal Thoughts:  No  Memory:  Immediate;   Fair Recent;   Fair Remote;   Fair  Judgement:  Fair  Insight:  Fair  Psychomotor Activity:  UTA  Concentration:  Concentration: Fair and Attention Span: Fair  Recall:  [employer] of Knowledge: Fair  Language: Fair  Akathisia:  No  Handed:  Right  AIMS (if indicated):UTA  Assets:  Communication Skills Desire for Improvement Housing Social Support  ADL's:  Intact  Cognition: WNL  Sleep:  Poor   Screenings: AIMS   Flowsheet Row Admission (Discharged) from OP Visit from 08/30/2018 in [place] Admission (Discharged) from 08/22/2018 in [place] [facility] Office Visit from 12/01/2017 in [place] Office Visit from 09/20/2017 in [place] Office Visit from 06/29/2017 in [place] Total Score 0 0 1 0 6    AUDIT   Flowsheet Row Admission (Discharged) from OP Visit from  08/30/2018 in [place] Admission (Discharged) from 08/22/2018 in [place] [facility] Admission (Discharged) from 03/03/2017 in [place] [facility]  Alcohol Use Disorder Identification Test Final Score (AUDIT) 0 0 0    GAD-7   Flowsheet Row Office Visit from 08/22/2015 in [place] [facility]  Total GAD-7 Score 6    [place] Office Visit from 03/23/2016 in [place] Office Visit from 08/22/2015 in [place] at [place] from 08/06/2015 in [place] Office Visit from 06/10/2015 in [place] Clinical Support from 05/19/2015 in [place]  PHQ-2 Total Score 0 1 0 0 0       Assessment and Plan: SHAUNTELL IGLESIA is a 60 year old Caucasian female who has a history of schizoaffective disorder, PTSD, migraine headaches, drug-induced Parkinson's disease was evaluated by telemedicine today.  Patient is currently making progress with  regards to her hallucinations however struggles with sleep.  She does have psychosocial stressors of medical problems, recent finding of neck swelling, currently undergoing further investigation.  Plan as noted below.  Plan Schizoaffective disorder-improving Continue Zyprexa 5 mg p.o. nightly. Patient reports she has been taking only 1/2 tablet few times a week.  She wants to stay on the dosage as she is taking now.  PTSD-stable We will monitor closely  GAD-improving Prozac 40 mg p.o. daily  Insomnia due to medical problems-unstable Continue Zyprexa as prescribed Patient will benefit from sufficient pain management, she also has frequent urination.  She will follow-up with her primary care provider. Discussed starting melatonin 3 to 5 mg p.o. nightly  Drug-induced Parkinson's  disease-stable Amantadine 100 mg p.o. daily at reduced dosage  Patient to sign a release to obtain medical records from her providers for managing her neck swelling.  I have reviewed notes per Dr. Roque Lias dated 03/05/2020 as noted above.  Follow-up in clinic in 2 to 3 weeks or sooner if needed.  I have spent atleast 20 minutes non face to face with patient today. More than 50 % of the time was spent for preparing to see the patient ( e.g., review of test, records ), obtaining and to review and separately obtained history , ordering medications and test ,psychoeducation and supportive psychotherapy and care coordination,as well as documenting clinical information in electronic health record. This note was generated in part or whole with voice recognition software. Voice recognition is usually quite accurate but there are transcription errors that can and very often do occur. I apologize for any typographical errors that were not detected and corrected.      Ursula Alert, MD 03/14/2020, 12:24 PM

## 2020-03-26 ENCOUNTER — Encounter: Payer: Self-pay | Admitting: Psychiatry

## 2020-03-26 ENCOUNTER — Telehealth (INDEPENDENT_AMBULATORY_CARE_PROVIDER_SITE_OTHER): Payer: Medicare HMO | Admitting: Psychiatry

## 2020-03-26 ENCOUNTER — Other Ambulatory Visit: Payer: Self-pay

## 2020-03-26 DIAGNOSIS — F251 Schizoaffective disorder, depressive type: Secondary | ICD-10-CM

## 2020-03-26 DIAGNOSIS — F4310 Post-traumatic stress disorder, unspecified: Secondary | ICD-10-CM

## 2020-03-26 DIAGNOSIS — F411 Generalized anxiety disorder: Secondary | ICD-10-CM

## 2020-03-26 DIAGNOSIS — G4701 Insomnia due to medical condition: Secondary | ICD-10-CM

## 2020-03-26 DIAGNOSIS — G2119 Other drug induced secondary parkinsonism: Secondary | ICD-10-CM | POA: Diagnosis not present

## 2020-03-26 DIAGNOSIS — Z9189 Other specified personal risk factors, not elsewhere classified: Secondary | ICD-10-CM

## 2020-03-26 MED ORDER — OLANZAPINE 5 MG PO TABS: 2.5000 mg | ORAL_TABLET | Freq: Every day | ORAL | 0 refills | Status: DC

## 2020-03-26 NOTE — Progress Notes (Unsigned)
Virtual Visit via Telephone Note  I connected with [name] on 03/26/20 at  3:30 PM EST by telephone and verified that I am speaking with the correct person using two identifiers.  Location Provider Location : [facility] Patient Location : Home  Participants: Patient , Provider   I discussed the limitations, risks, security and privacy concerns of performing an evaluation and management service by telephone and the availability of in person appointments. I also discussed with the patient that there may be a patient responsible charge related to this service. The patient expressed understanding and agreed to proceed.   I discussed the assessment and treatment plan with the patient. The patient was provided an opportunity to ask questions and all were answered. The patient agreed with the plan and demonstrated an understanding of the instructions.   The patient was advised to call back or seek an in-person evaluation if the symptoms worsen or if the condition fails to improve as anticipated.   [place] MD OP Progress Note  03/26/2020 5:27 PM LATISHIA SUITT  MRN:  [id number]  Chief Complaint:  Chief Complaint    Follow-up     HPI: ANICE WILSHIRE is a 60 year old Caucasian female, lives in [place], has a history of schizoaffective disorder, PTSD, GAD, drug-induced Parkinson's disease, headaches, memory problems, diabetes melitis, chronic pain was evaluated by telemedicine today.  Patient today reports she is currently doing okay with regards to her mood.  She does have anxiety about her health and other situational stressors however she is coping okay.  She reports she continues to hear noises, static noises.  She however reports she has been coping with it since she is on the medication.  She however reports she does not want to stay on the olanzapine since she is having side effects of the medication increasing her appetite as well as she feels as though she has gained a lot of  weight.  She also feels as though her hands are swollen when she wakes up in the morning and feels that could be the medication as well.  Patient with right-sided swelling of her neck has upcoming CT scan scheduled.  She is currently following up with her providers on the same.  Patient reports sleep is overall okay.  Patient denies any suicidality, homicidality.  Patient denies any other concerns today.  Visit Diagnosis:    ICD-10-CM   1. Schizoaffective disorder, depressive type ([place])  F25.1 OLANZapine (ZYPREXA) 5 MG tablet  2. PTSD (post-traumatic stress disorder)  F43.10   3. Generalized anxiety disorder  F41.1   4. Insomnia due to medical condition  G47.01    frequent urination  5. Drug-induced Parkinson's disease ([place])  G21.19   6. At risk for prolonged QT interval syndrome  Z91.89 EKG 12-Lead    Past Psychiatric History: I have reviewed past psychiatric history from my progress note on 05/25/2017  Past Medical History:  Past Medical History:  Diagnosis Date  . Anxiety   . Asthma   . Chronic kidney disease   . Chronic pain    previously saw Dr. Consuela Mimes in pain clinic, then saw pain specialist in [place]  . Depression   . Diabetes mellitus ([place])   . Frequency of urination   . GERD (gastroesophageal reflux disease)   . Headache(784.0)   . High cholesterol   . History of kidney stones   . Hypertension   . IBS (irritable bowel syndrome)   . Left ankle instability   . Left knee  DJD   . Lumbar Degenerative Disc Disease of  10/11/2014  . Neuromuscular disorder ([place])   . Osteoarthritis of hip (Right) 05/05/2015  . Other enthesopathy of ankle and tarsus 12/15/2009   Qualifier: Diagnosis of  By: Oneida Alar MD, KARL    . Parkinson's disease ([place])   . Peripheral sensory neuropathy (Bilateral) 11/19/2014  . Postoperative nausea and vomiting   . Schizophrenia [facility])     Past Surgical History:  Procedure Laterality Date  . ABDOMINAL HYSTERECTOMY    . ANKLE SURGERY Left    x 2   . APPENDECTOMY    . CARDIAC CATHETERIZATION    . COLONOSCOPY  2013  . COLONOSCOPY WITH PROPOFOL N/A 04/25/2017   Procedure: COLONOSCOPY WITH PROPOFOL;  Surgeon: Manya Silvas, MD;  Location: [facility] ENDOSCOPY;  Service: Endoscopy;  Laterality: N/A;  . CYSTOSCOPY/URETEROSCOPY/HOLMIUM LASER/STENT PLACEMENT Left 09/11/2019   Procedure: CYSTOSCOPY/URETEROSCOPY/HOLMIUM LASER/STENT PLACEMENT;  Surgeon: Abbie Sons, MD;  Location: [facility] ORS;  Service: Urology;  Laterality: Left;  . ESOPHAGOGASTRODUODENOSCOPY (EGD) WITH PROPOFOL N/A 10/03/2014   Procedure: ESOPHAGOGASTRODUODENOSCOPY (EGD) WITH PROPOFOL;  Surgeon: Josefine Class, MD;  Location: [facility] ENDOSCOPY;  Service: Endoscopy;  Laterality: N/A;  . ESOPHAGOGASTRODUODENOSCOPY (EGD) WITH PROPOFOL  04/25/2017   Procedure: ESOPHAGOGASTRODUODENOSCOPY (EGD) WITH PROPOFOL;  Surgeon: Manya Silvas, MD;  Location: [facility] ENDOSCOPY;  Service: Endoscopy;;  . JOINT REPLACEMENT Left    knee  . KNEE ARTHROSCOPY  1997   left knee  . LEFT HEART CATH AND CORONARY ANGIOGRAPHY Left 11/10/2017   Procedure: LEFT HEART CATH AND CORONARY ANGIOGRAPHY;  Surgeon: Isaias Cowman, MD;  Location: [facility] CV LAB;  Service: Cardiovascular;  Laterality: Left;  . LEFT HEART CATHETERIZATION WITH CORONARY ANGIOGRAM N/A 01/11/2013   Procedure: LEFT HEART CATHETERIZATION WITH CORONARY ANGIOGRAM;  Surgeon: Sinclair Grooms, MD;  Location: [facility] CATH LAB;  Service: Cardiovascular;  Laterality: N/A;  . TOTAL KNEE ARTHROPLASTY  08/30/2011   Procedure: TOTAL KNEE ARTHROPLASTY;  Surgeon: Lorn Junes, MD;  Location: [place];  Service: Orthopedics;  Laterality: Left;    Family Psychiatric History: I have reviewed family psychiatric history from my progress note on 05/25/2017  Family History:  Family History  Problem Relation Age of Onset  . Cancer Mother   . Hypertension Father   . Heart disease Father   . Alcohol abuse Father   . Breast cancer Neg Hx     Social  History: Reviewed social history from my progress note on 05/25/2017 Social History   Socioeconomic History  . Marital status: Divorced    Spouse name: Not on file  . Number of children: 2  . Years of education: 21  . Highest education level: 11th grade  Occupational History    Employer: [employer]  Tobacco Use  . Smoking status: Former Smoker    Packs/day: 2.00    Years: 15.00    Pack years: 30.00    Types: Cigarettes    Quit date: 02/09/1995    Years since quitting: 25.1  . Smokeless tobacco: Never Used  Vaping Use  . Vaping Use: Never used  Substance and Sexual Activity  . Alcohol use: No    Alcohol/week: 0.0 standard drinks  . Drug use: No  . Sexual activity: Not Currently  Other Topics Concern  . Not on file  Social History Narrative   Patient lives at home alone. Patient works at [employer].   Caffeine daily- 2   Right handed.   Education- 11 th grade  Social Determinants of Health   Financial Resource Strain: Not on file  Food Insecurity: Not on file  Transportation Needs: Not on file  Physical Activity: Not on file  Stress: Not on file  Social Connections: Not on file    Allergies:  Allergies  Allergen Reactions  . Prolixin Decanoate [Fluphenazine]     Ineffective   . Benztropine Other (See Comments)    Hair fall out  Suicide thoughts  . Benztropine Mesylate Other (See Comments)  . Buprenorphine Hcl Other (See Comments)    Unable to void  . Carbidopa-Levodopa Nausea Only  . Codeine Hives  . Cymbalta [Duloxetine Hcl] Other (See Comments)    Altered mental status, Alopecia, visual hallucinations, nightmares  . Gabapentin Swelling  . Lyrica [Pregabalin] Other (See Comments)    Alopecia, visual hallucinations, nightmares Altered mental status  . Morphine And Related Other (See Comments)    Unable to void  . Nortripytline Hcl [Nortriptyline] Other (See Comments)    Hair loss and night mares   . Nsaids     REACTION: palpitations,  diaphoresis  . Penicillins Nausea And Vomiting and Other (See Comments)    REACTION: upset stomach Has patient had a PCN reaction causing immediate rash, facial/tongue/throat swelling, SOB or lightheadedness with hypotension: No Has patient had a PCN reaction causing severe rash involving mucus membranes or skin necrosis: No Has patient had a PCN reaction that required hospitalization: No Has patient had a PCN reaction occurring within the last 10 years: No If all of the above answers are "NO", then may proceed with Cephalosporin use.  . Wellbutrin [Bupropion]     Constipation, mood swings    Metabolic Disorder Labs: Lab Results  Component Value Date   HGBA1C 6.2 (H) 03/06/2017   MPG 131.24 03/06/2017   MPG 108 05/17/2015   Lab Results  Component Value Date   PROLACTIN 64.2 (H) 03/06/2017   Lab Results  Component Value Date   CHOL 146 08/22/2018   TRIG 172 (H) 08/22/2018   HDL 49 08/22/2018   CHOLHDL 3.0 08/22/2018   VLDL 34 08/22/2018   LDLCALC 63 08/22/2018   LDLCALC 59 03/06/2017   Lab Results  Component Value Date   TSH 0.959 08/22/2018   TSH 2.766 08/24/2017    Therapeutic Level Labs: No results found for: LITHIUM No results found for: VALPROATE No components found for:  CBMZ  Current Medications: Current Outpatient Medications  Medication Sig Dispense Refill  . ACCU-CHEK GUIDE test strip     . acetaminophen (TYLENOL) 500 MG tablet Take 1,000 mg by mouth every 6 (six) hours as needed for moderate pain or headache.     . albuterol (PROVENTIL HFA;VENTOLIN HFA) 108 (90 Base) MCG/ACT inhaler Inhale 2 puffs into the lungs every 6 (six) hours as needed for wheezing or shortness of breath.    . Alcohol Swabs (B-D SINGLE USE SWABS REGULAR) PADS     . amantadine (SYMMETREL) 100 MG capsule Take 1 capsule (100 mg total) by mouth daily. 30 capsule 2  . aspirin EC 81 MG tablet Take 81 mg by mouth daily. Swallow whole.    . Blood Glucose Calibration (ACCU-CHEK AVIVA) SOLN      . Blood Glucose Monitoring Suppl (ACCU-CHEK AVIVA PLUS) w/Device KIT     . budesonide-formoterol (SYMBICORT) 160-4.5 MCG/ACT inhaler Inhale into the lungs.    . Cholecalciferol (VITAMIN D-3) 125 MCG (5000 UT) TABS Take 5,000 Units by mouth daily.    . clindamycin (CLEOCIN) 300 MG capsule Take  600 mg by mouth See admin instructions. Take 600 mg 1 hour prior to dental work    . Cyanocobalamin (B-12 COMPLIANCE INJECTION IJ) Inject 1 Dose as directed every 30 (thirty) days.    . cyclobenzaprine (FLEXERIL) 5 MG tablet Take 5 mg by mouth 2 (two) times daily.    . diphenhydrAMINE (BENADRYL) 25 MG tablet Take 25 mg by mouth daily as needed for allergies.     Marland Kitchen docusate sodium (COLACE) 50 MG capsule Take 100 mg by mouth 2 (two) times daily.    Marland Kitchen FLUCELVAX QUADRIVALENT 0.5 ML injection     . FLUoxetine (PROZAC) 40 MG capsule Take 1 capsule (40 mg total) by mouth daily. 90 capsule 0  . furosemide (LASIX) 20 MG tablet     . glimepiride (AMARYL) 2 MG tablet Take 2 mg by mouth 2 (two) times daily.     Marland Kitchen HYDROcodone-acetaminophen (NORCO) 7.5-325 MG tablet Take 0.5 tablets by mouth 2 (two) times daily.     . hydroxypropyl methylcellulose / hypromellose (ISOPTO TEARS / GONIOVISC) 2.5 % ophthalmic solution Place 1 drop into both eyes every 4 (four) hours as needed for dry eyes.     . insulin isophane & regular human (NOVOLIN 70/30 FLEXPEN) (70-30) 100 UNIT/ML KwikPen Inject into the skin.    . Insulin Syringe-Needle U-100 (INSULIN SYRINGE 1CC/31GX5/16") 31G X 5/16" 1 ML MISC See admin instructions.    . Lancets Misc. (ACCU-CHEK SOFTCLIX LANCET DEV) KIT Use 1 each 3 (three) times daily Use as instructed.    Marland Kitchen levothyroxine (SYNTHROID) 25 MCG tablet Take by mouth.    . levothyroxine (SYNTHROID) 50 MCG tablet Take by mouth.    . metFORMIN (GLUCOPHAGE) 500 MG tablet Take 1 tablet (500 mg total) by mouth 2 (two) times daily with a meal. For diabetes management (Patient taking differently: Take 1,000 mg by mouth 2  (two) times daily with a meal. For diabetes management) 10 tablet 0  . montelukast (SINGULAIR) 10 MG tablet Take 10 mg by mouth at bedtime.     . nitrofurantoin, macrocrystal-monohydrate, (MACROBID) 100 MG capsule Take 100 mg by mouth 2 (two) times daily.    Marland Kitchen NOVOLIN 70/30 FLEXPEN (70-30) 100 UNIT/ML KwikPen     . OLANZapine (ZYPREXA) 5 MG tablet Take 0.5 tablets (2.5 mg total) by mouth at bedtime. 90 tablet 0  . omeprazole (PRILOSEC) 40 MG capsule Take 40 mg by mouth daily as needed (heartburn).     . ondansetron (ZOFRAN) 4 MG tablet Take 4 mg by mouth every 8 (eight) hours as needed for nausea or vomiting.    Marland Kitchen oxybutynin (DITROPAN) 5 MG tablet 1 tab tid prn frequency,urgency, bladder spasm 30 tablet 0  . pravastatin (PRAVACHOL) 40 MG tablet Take 40 mg by mouth at bedtime.     . predniSONE (STERAPRED UNI-PAK 21 TAB) 10 MG (21) TBPK tablet Take by mouth as directed.    Marland Kitchen RELION INSULIN SYRINGE 31G X 15/64" 1 ML MISC     . rizatriptan (MAXALT) 10 MG tablet Take 10 mg by mouth 2 (two) times daily as needed for migraine. May repeat in 2 hours if needed     . rOPINIRole (REQUIP) 0.25 MG tablet Take 0.5 mg by mouth in the morning and at bedtime.     . torsemide (DEMADEX) 20 MG tablet Take 20 mg by mouth every other day.     . traMADol (ULTRAM) 50 MG tablet Take 2 tablets (100 mg total) by mouth 2 (two) times daily for  15 days. (Patient taking differently: Take 50-100 mg by mouth See admin instructions. Take 2 tablets (100 mg) by mouth in the morning, 1 tablet (50 mg) by mouth in the evening, & 1 tablet (50 mg) by mouth at night.) 30 tablet 1  . verapamil (CALAN-SR) 180 MG CR tablet Take 180 mg by mouth at bedtime.     No current facility-administered medications for this visit.     Musculoskeletal: Strength & Muscle Tone: UTA Gait & Station: UTA Patient leans: N/A  Psychiatric Specialty Exam: Review of Systems  HENT:       Right-sided swelling of neck  Psychiatric/Behavioral: Positive for  hallucinations. The patient is nervous/anxious.   All other systems reviewed and are negative.   There were no vitals taken for this visit.There is no height or weight on file to calculate BMI.  General Appearance: UTA  Eye Contact:  UTA  Speech:  Clear and Coherent  Volume:  Normal  Mood:  Anxious  Affect:  UTA  Thought Process:  Goal Directed and Descriptions of Associations: Intact  Orientation:  Full (Time, Place, and Person)  Thought Content: Hallucinations: Auditory  noises  Suicidal Thoughts:  No  Homicidal Thoughts:  No  Memory:  Immediate;   Fair Recent;   Fair Remote;   Fair  Judgement:  Fair  Insight:  Fair  Psychomotor Activity:  UTA  Concentration:  Concentration: Fair and Attention Span: Fair  Recall:  [employer] of Knowledge: Fair  Language: Fair  Akathisia:  No  Handed:  Right  AIMS (if indicated): UTA  Assets:  Communication Skills Desire for Improvement Housing  ADL's:  Intact  Cognition: WNL  Sleep:  Fair   Screenings: AIMS   Flowsheet Row Admission (Discharged) from OP Visit from 08/30/2018 in [place] Admission (Discharged) from 08/22/2018 in [facility] Office Visit from 12/01/2017 in [place] Office Visit from 09/20/2017 in [place] Office Visit from 06/29/2017 in [place] Total Score 0 0 1 0 6    AUDIT   Flowsheet Row Admission (Discharged) from OP Visit from 08/30/2018 in [place] Admission (Discharged) from 08/22/2018 in [place] [facility] Admission (Discharged) from 03/03/2017 in [place] [facility]  Alcohol Use Disorder Identification Test Final Score (AUDIT) 0 0 0    GAD-7   Flowsheet Row Office Visit from 08/22/2015 in [place] at [facility]  Total GAD-7 Score 6    PHQ2-9   Flowsheet Row Video  Visit from 03/26/2020 in [place] Office Visit from 03/23/2016 in [place] Office Visit from 08/22/2015 in [facility] [facility] [place] from 08/06/2015 in [place] Office Visit from 06/10/2015 in [place]  PHQ-2 Total Score 0 0 1 0 0    Flowsheet Row Video Visit from 03/26/2020 in [place] Admission (Discharged) from OP Visit from 08/30/2018 in [place] Admission (Discharged) from 08/22/2018 in [facility]  C-SSRS RISK CATEGORY No Risk No Risk No Risk       Assessment and Plan: NEYAH ELLERMAN is a 60 year old Caucasian female who has a history of schizoaffective disorder, PTSD, migraine headaches, drug-induced Parkinson's disease was evaluated by telemedicine today.  Patient is currently making progress with regards to her mood however reports  she is concerned about weight gain side effects as well as possible swelling of her hands especially in the morning likely due to olanzapine.  She is interested in changing her medication.  Discussed plan as noted below.  Plan Schizoaffective disorder-stable Reduce Zyprexa to 2.5 mg p.o. nightly Discussed with patient that Zyprexa can be tapered off and she can be started on another antipsychotic medication like Navane.  However will need an EKG. She will get the EKG and let writer know.  PTSD-stable We will monitor closely  GAD-improving Prozac 40 mg p.o. daily  Insomnia due to medical problems-improving Continue Zyprexa as prescribed-dose reduced as noted above. Patient was advised to start melatonin 3 to 5 mg p.o. nightly as needed.  Drug-induced Parkinson's disease-stable Amantadine 100 mg p.o. daily at reduced dosage.  High risk medication/at risk for prolonged QT syndrome-will order  EKG.  Follow-up in clinic in 2 weeks or sooner if needed.  I have spent atleast 25  minutes non face to face  with patient today. More than 50 % of the time was spent for preparing to see the patient ( e.g., review of test, records ), obtaining and to review and separately obtained history , ordering medications and test ,psychoeducation and supportive psychotherapy and care coordination,as well as documenting clinical information in electronic health record. This note was generated in part or whole with voice recognition software. Voice recognition is usually quite accurate but there are transcription errors that can and very often do occur. I apologize for any typographical errors that were not detected and corrected.      Ursula Alert, MD 03/27/2020, 9:58 AM

## 2020-03-27 ENCOUNTER — Other Ambulatory Visit: Payer: Self-pay | Admitting: Internal Medicine

## 2020-03-27 ENCOUNTER — Ambulatory Visit
Admission: RE | Admit: 2020-03-27 | Discharge: 2020-03-27 | Disposition: A | Discharge status: Home / Self Care | Payer: Medicare HMO | Source: Ambulatory Visit | Attending: Internal Medicine | Admitting: Internal Medicine

## 2020-03-27 DIAGNOSIS — R221 Localized swelling, mass and lump, neck: Secondary | ICD-10-CM | POA: Diagnosis not present

## 2020-03-27 DIAGNOSIS — R9431 Abnormal electrocardiogram [ECG] [EKG]: Secondary | ICD-10-CM | POA: Diagnosis not present

## 2020-03-27 DIAGNOSIS — H5713 Ocular pain, bilateral: Secondary | ICD-10-CM

## 2020-03-27 DIAGNOSIS — E65 Localized adiposity: Secondary | ICD-10-CM | POA: Insufficient documentation

## 2020-03-27 MED ORDER — IOHEXOL 300 MG/ML  SOLN
75.0000 mL | Freq: Once | INTRAMUSCULAR | Status: AC | PRN
  Administered 2020-03-27: 75 mL via INTRAVENOUS

## 2020-04-01 ENCOUNTER — Telehealth: Payer: Self-pay

## 2020-04-01 DIAGNOSIS — M79605 Pain in left leg: Secondary | ICD-10-CM | POA: Diagnosis not present

## 2020-04-01 DIAGNOSIS — M542 Cervicalgia: Secondary | ICD-10-CM | POA: Diagnosis not present

## 2020-04-01 NOTE — Telephone Encounter (Signed)
pt called states that her test result are in and that she sees the surgeon today.

## 2020-04-02 DIAGNOSIS — E1165 Type 2 diabetes mellitus with hyperglycemia: Secondary | ICD-10-CM | POA: Diagnosis not present

## 2020-04-02 DIAGNOSIS — I10 Essential (primary) hypertension: Secondary | ICD-10-CM | POA: Diagnosis not present

## 2020-04-02 DIAGNOSIS — Z6830 Body mass index (BMI) 30.0-30.9, adult: Secondary | ICD-10-CM | POA: Diagnosis not present

## 2020-04-02 DIAGNOSIS — E669 Obesity, unspecified: Secondary | ICD-10-CM | POA: Diagnosis not present

## 2020-04-02 DIAGNOSIS — Z794 Long term (current) use of insulin: Secondary | ICD-10-CM | POA: Diagnosis not present

## 2020-04-02 DIAGNOSIS — E782 Mixed hyperlipidemia: Secondary | ICD-10-CM | POA: Diagnosis not present

## 2020-04-03 ENCOUNTER — Other Ambulatory Visit (HOSPITAL_COMMUNITY): Payer: Self-pay | Admitting: Internal Medicine

## 2020-04-03 ENCOUNTER — Other Ambulatory Visit: Payer: Self-pay | Admitting: Internal Medicine

## 2020-04-03 DIAGNOSIS — R221 Localized swelling, mass and lump, neck: Secondary | ICD-10-CM

## 2020-04-03 DIAGNOSIS — E538 Deficiency of other specified B group vitamins: Secondary | ICD-10-CM | POA: Diagnosis not present

## 2020-04-07 NOTE — Telephone Encounter (Signed)
I haven't received EKG report yet.  We'll need to review it prior to making medication changes.  Please asked patient to sign a release so we can request medical records-EKG from her provider.  Or please have patient call her provider to fax it to [place].

## 2020-04-07 NOTE — Telephone Encounter (Signed)
pt called left message that she did her ekg and she needs her medication sent to [place] or to [facility].

## 2020-04-08 ENCOUNTER — Other Ambulatory Visit: Payer: Self-pay

## 2020-04-08 ENCOUNTER — Ambulatory Visit: Payer: Medicare HMO | Admitting: Psychiatry

## 2020-04-08 ENCOUNTER — Encounter: Payer: Self-pay | Admitting: Psychiatry

## 2020-04-08 VITALS — BP 135/72 | HR 89 | Temp 97.8°F

## 2020-04-08 DIAGNOSIS — G2119 Other drug induced secondary parkinsonism: Secondary | ICD-10-CM | POA: Diagnosis not present

## 2020-04-08 DIAGNOSIS — Z9189 Other specified personal risk factors, not elsewhere classified: Secondary | ICD-10-CM

## 2020-04-08 DIAGNOSIS — F251 Schizoaffective disorder, depressive type: Secondary | ICD-10-CM | POA: Diagnosis not present

## 2020-04-08 DIAGNOSIS — G4701 Insomnia due to medical condition: Secondary | ICD-10-CM | POA: Diagnosis not present

## 2020-04-08 DIAGNOSIS — F4310 Post-traumatic stress disorder, unspecified: Secondary | ICD-10-CM

## 2020-04-08 DIAGNOSIS — F411 Generalized anxiety disorder: Secondary | ICD-10-CM

## 2020-04-08 MED ORDER — FLUOXETINE HCL 20 MG PO CAPS
20.0000 mg | ORAL_CAPSULE | Freq: Every day | ORAL | 0 refills | Status: DC
Start: 2020-04-08 — End: 2020-06-13

## 2020-04-08 NOTE — Patient Instructions (Addendum)
Valbenazine oral capsules What is this medicine? VALBENAZINE (val BEN a zeen) is used to treat the involuntary movements caused by tardive dyskinesia. This medicine may be used for other purposes; ask your health care provider or pharmacist if you have questions. COMMON BRAND NAME(S): INGREZZA What should I tell my health care provider before I take this medicine? They need to know if you have any of these conditions:  heart disease  history of irregular heartbeat  if you often drink alcohol  liver disease  Parkinson's disease  an unusual or allergic reaction to valbenazine, other medicines, foods, dyes, or preservatives  pregnant or trying to get pregnant  breast-feeding How should I use this medicine? Take this medicine by mouth with a glass of water. Follow the directions on the prescription label. You can take it with or without food. Take your medicine at regular intervals. Do not take it more often than directed. Do not stop taking except on your doctor's advice. Talk to your pediatrician regarding the use of this medicine in children. Special care may be needed. Overdosage: If you think you have taken too much of this medicine contact a poison control center or emergency room at once. NOTE: This medicine is only for you. Do not share this medicine with others. What if I miss a dose? If you miss a dose, take it as soon as you can. If it is almost time for your next dose, take only that dose. Do not take double or extra doses. What may interact with this medicine? Do not take this medicine with any of the following medications:  deutetrabenazine  tetrabenazine This medicine may also interact with the following medications:  alcohol  certain medicines for fungal infections like ketoconazole and itraconazole  certain medicines for seizures like carbamazepine, phenytoin  clarithromycin  digoxin  fluoxetine  MAOIs like Carbex, Eldepryl, Marplan, Nardil, and  Parnate  medicines for sleep  paroxetine  quinidine  rifampin  St. John's Wort This list may not describe all possible interactions. Give your health care provider a list of all the medicines, herbs, non-prescription drugs, or dietary supplements you use. Also tell them if you smoke, drink alcohol, or use illegal drugs. Some items may interact with your medicine. What should I watch for while using this medicine? Visit your doctor or health care professional for regular checks on your progress. Tell your doctor or healthcare professional if your symptoms do not start to get better or if they get worse. You may get drowsy or dizzy. Do not drive, use machinery, or do anything that needs mental alertness until you know how this medicine affects you. Do not stand or sit up quickly, especially if you are an older patient. This reduces the risk of dizzy or fainting spells. Alcohol may interfere with the effect of this medicine. Talk to your doctor or pharmacist before drinking alcoholic beverages. This medicine may cause constipation. Try to have a bowel movement at least every 2 to 3 days. If you do not have a bowel movement for 3 days, call your doctor or health care professional. Your mouth may get dry. Chewing sugarless gum or sucking hard candy, and drinking plenty of water may help. Contact your doctor if the problem does not go away or is severe. What side effects may I notice from receiving this medicine? Side effects that you should report to your doctor or health care professional as soon as possible:  allergic reactions like skin rash, itching or hives, swelling of  the face, lips, or tongue  loss of balance or coordination, falls  restlessness, pacing, inability to keep still  signs and symptoms of a dangerous change in heartbeat or heart rhythm like chest pain; dizziness; fast or irregular heartbeat; palpitations; feeling faint or lightheaded, falls; breathing problems  signs and  symptoms of high blood sugar such as dizziness; dry mouth; dry skin; fruity breath; nausea; stomach pain; increased hunger or thirst; increased urination  uncontrollable body movements Side effects that usually do not require medical attention (report these to your doctor or health care professional if they continue or are bothersome):  constipation  drowsiness  dry mouth  headache  nausea This list may not describe all possible side effects. Call your doctor for medical advice about side effects. You may report side effects to FDA at 1-800-FDA-1088. Where should I keep my medicine? Keep out of the reach of children. Store at room temperature between 15 and 30 degrees C (59 and 86 degrees F). Throw away any unused medicine after the expiration date. NOTE: This sheet is a summary. It may not cover all possible information. If you have questions about this medicine, talk to your doctor, pharmacist, or health care provider.  2021 [employer] Standard (2018-08-18 17:02:00) Thiothixene capsules What is this medicine? THIOTHIXENE (thye oh THIX een) is used to treat schizophrenia. This medicine may be used for other purposes; ask your health care provider or pharmacist if you have questions. COMMON BRAND NAME(S): Navane What should I tell my health care provider before I take this medicine? They need to know if you have any of these conditions:  dementia  diabetes  difficulty swallowing  have trouble controlling your muscles  heart disease  if you often drink alcohol  liver disease  low blood counts, like low white cell, platelet, or red cell counts  Parkinson's disease  seizures  an unusual or allergic reaction to thiothixene, other medicines, foods, dyes, or preservatives  pregnant or trying to get pregnant  breast-feeding How should I use this medicine? Take this medicine by mouth with a glass of water. Follow the directions on the prescription label. Take your  doses at regular intervals. Do not take your medicine more often than directed. Do not suddenly stop taking this medicine. You may need to gradually reduce the dose. Ask your doctor or health care professional for advice. Talk to your pediatrician regarding the use of this medicine in children. Special care may be needed. While this drug may be prescribed for children as young as 12 years for selected conditions, precautions do apply. Overdosage: If you think you have taken too much of this medicine contact a poison control center or emergency room at once. NOTE: This medicine is only for you. Do not share this medicine with others. What if I miss a dose? If you miss a dose, take it as soon as you can. If it is almost time for your next dose, take only that dose. Do not take double or extra doses. What may interact with this medicine? Do not take this medicine with any of the following medications:  metoclopramide This medicine may also interact with the following medications:  alcohol  antihistamines for allergy, cough, and cold  atropine  carbamazepine  certain medicines for anxiety or sleep  certain medicines for bladder problems like oxybutynin, tolterodine  certain medicines for depression like amitriptyline, fluoxetine, sertraline  certain medicines for stomach problems like dicyclomine, hyoscyamine  certain medicines for travel sickness like scopolamine  epinephrine  general anesthetics like halothane, isoflurane, methoxyflurane, propofol  ipratropium  levodopa or other medicines for Parkinson's disease  lithium  medicines for blood pressure  medicines for seizures  medicines that relax muscles for surgery  narcotic medicines for pain  phenothiazines like chlorpromazine, mesoridazine, prochlorperazine, thioridazine This list may not describe all possible interactions. Give your health care provider a list of all the medicines, herbs, non-prescription drugs, or  dietary supplements you use. Also tell them if you smoke, drink alcohol, or use illegal drugs. Some items may interact with your medicine. What should I watch for while using this medicine? Visit your health care professional for regular checks on your progress. Tell your health care professional if symptoms do not start to get better or if they get worse. Do not stop taking except on your health care professional's advice. You may develop a severe reaction. Your health care professional will tell you how much medicine to take. You may get drowsy or dizzy. Do not drive, use machinery, or do anything that needs mental alertness until you know how this medicine affects you. Do not stand or sit up quickly, especially if you are an older patient. This reduces the risk of dizzy or fainting spells. Alcohol may interfere with the effect of this medicine. Avoid alcoholic drinks. This medicine may increase blood sugar. Ask your health care provider if changes in diet or medicines are needed if you have diabetes. This medicine can make you more sensitive to the sun. Keep out of the sun. If you cannot avoid being in the sun, wear protective clothing and use sunscreen. Do not use sun lamps or tanning beds/booths. This drug can cause problems with controlling your body temperature. It can lower the response of your body to cold temperatures. If possible, stay indoors during cold weather. If you must go outdoors, wear warm clothes. It can also lower the response of your body to heat. Do not overheat. Do not over-exercise. Stay out of the sun when possible. If you must be in the sun, wear cool clothing. Drink plenty of water. If you have trouble controlling your body temperature, call your health care provider right away. Your mouth may get dry. Chewing sugarless gum or sucking hard candy, and drinking plenty of water may help. Contact your doctor if the problem does not go away or is severe. What side effects may I notice  from receiving this medicine? Side effects that you should report to your doctor or health care professional as soon as possible:  allergic reactions like skin rash, itching or hives, swelling of the face, lips, or tongue  abnormal production of milk  breast enlargement in both males and females  changes in vision  chest pain  confusion  fast, irregular heartbeat  fever, chills, sore throat  seizures  signs and symptoms of high blood sugar such as being more thirsty or hungry or having to urinate more than normal. You may also feel very tired or have blurry vision.  signs and symptoms of liver injury like dark yellow or brown urine; general ill feeling or flu-like symptoms; light-colored stools; loss of appetite; nausea; right upper belly pain; unusually weak or tired; yellowing of the eyes or skin  signs and symptoms of low blood pressure like dizziness; feeling faint or lightheaded, falls; unusually weak or tired  trouble passing urine or change in the amount of urine  trouble swallowing  uncontrollable movements of the arms, face, head, mouth, neck, or upper body  unusual bruising  or bleeding  unusually weak or tired Side effects that usually do not require medical attention (report to your doctor or health care professional if they continue or are bothersome):  constipation  drowsiness  dry mouth This list may not describe all possible side effects. Call your doctor for medical advice about side effects. You may report side effects to FDA at 1-800-FDA-1088. Where should I keep my medicine? Keep out of the reach of children. Store at room temperature between 15 and 30 degrees C (59 and 86 degrees F). Protect from light. Throw away any unused medicine after the expiration date. NOTE: This sheet is a summary. It may not cover all possible information. If you have questions about this medicine, talk to your doctor, pharmacist, or health care provider.  2021  [employer] Standard (2018-12-19 11:59:19)

## 2020-04-08 NOTE — Progress Notes (Signed)
[facility] MD OP Progress Note  04/08/2020 9:20 PM Heather Haley  MRN:  [id number]  Chief Complaint:  Chief Complaint    Follow-up     HPI: Heather Haley is a 60 year old Caucasian female, lives in [place], has a history of schizoaffective disorder, PTSD, GAD, drug-induced Parkinson's disease, headache, memory problems, diabetes melitis, chronic pain, was evaluated in office today.  Patient today reports she is struggling with a lot of anxiety.  She feels nervous, restless, worries a lot about different things.  She reports has been having trouble leaving her home, does not like to be in crowded places because of her anxiety.  She is also going through medical problems, has a lump on her neck, is currently scheduled for ultrasound.  That also worries her.  Patient denies any depressive symptoms.  She reports sleep is overall okay.  She continues to have auditory hallucinations of hearing noises, however it is manageable on the olanzapine.  She however does not want to stay on the olanzapine due to the side effect of increased appetite and weight gain.  Patient denies any suicidality or homicidality.  Patient denies any other concerns today.  Visit Diagnosis:    ICD-10-CM   1. Schizoaffective disorder, depressive type ([place])  F25.1   2. PTSD (post-traumatic stress disorder)  F43.10 FLUoxetine (PROZAC) 20 MG capsule  3. Generalized anxiety disorder  F41.1 FLUoxetine (PROZAC) 20 MG capsule  4. Insomnia due to medical condition  G47.01   5. Drug-induced Parkinson's disease ([place])  G21.19   6. At risk for prolonged QT interval syndrome  Z91.89     Past Psychiatric History: I have reviewed past psychiatric history from my progress note on 05/25/2017.  Past Medical History:  Past Medical History:  Diagnosis Date  . Anxiety   . Asthma   . Chronic kidney disease   . Chronic pain    previously saw Dr. Consuela Mimes in pain clinic, then saw pain specialist in [place]  . Depression    . Diabetes mellitus ([place])   . Frequency of urination   . GERD (gastroesophageal reflux disease)   . Headache(784.0)   . High cholesterol   . History of kidney stones   . Hypertension   . IBS (irritable bowel syndrome)   . Left ankle instability   . Left knee DJD   . Lumbar Degenerative Disc Disease of  10/11/2014  . Neuromuscular disorder ([place])   . Osteoarthritis of hip (Right) 05/05/2015  . Other enthesopathy of ankle and tarsus 12/15/2009   Qualifier: Diagnosis of  By: Oneida Alar MD, KARL    . Parkinson's disease ([place])   . Peripheral sensory neuropathy (Bilateral) 11/19/2014  . Postoperative nausea and vomiting   . Schizophrenia [facility])     Past Surgical History:  Procedure Laterality Date  . ABDOMINAL HYSTERECTOMY    . ANKLE SURGERY Left    x 2  . APPENDECTOMY    . CARDIAC CATHETERIZATION    . COLONOSCOPY  2013  . COLONOSCOPY WITH PROPOFOL N/A 04/25/2017   Procedure: COLONOSCOPY WITH PROPOFOL;  Surgeon: Manya Silvas, MD;  Location: [facility] ENDOSCOPY;  Service: Endoscopy;  Laterality: N/A;  . CYSTOSCOPY/URETEROSCOPY/HOLMIUM LASER/STENT PLACEMENT Left 09/11/2019   Procedure: CYSTOSCOPY/URETEROSCOPY/HOLMIUM LASER/STENT PLACEMENT;  Surgeon: Abbie Sons, MD;  Location: [facility] ORS;  Service: Urology;  Laterality: Left;  . ESOPHAGOGASTRODUODENOSCOPY (EGD) WITH PROPOFOL N/A 10/03/2014   Procedure: ESOPHAGOGASTRODUODENOSCOPY (EGD) WITH PROPOFOL;  Surgeon: Josefine Class, MD;  Location[facility] ENDOSCOPY;  Service: Endoscopy;  Laterality: N/A;  .  ESOPHAGOGASTRODUODENOSCOPY (EGD) WITH PROPOFOL  04/25/2017   Procedure: ESOPHAGOGASTRODUODENOSCOPY (EGD) WITH PROPOFOL;  Surgeon: Manya Silvas, MD;  Location: [facility] ENDOSCOPY;  Service: Endoscopy;;  . JOINT REPLACEMENT Left    knee  . KNEE ARTHROSCOPY  1997   left knee  . LEFT HEART CATH AND CORONARY ANGIOGRAPHY Left 11/10/2017   Procedure: LEFT HEART CATH AND CORONARY ANGIOGRAPHY;  Surgeon: Isaias Cowman, MD;  Location: [place]  CV LAB;  Service: Cardiovascular;  Laterality: Left;  . LEFT HEART CATHETERIZATION WITH CORONARY ANGIOGRAM N/A 01/11/2013   Procedure: LEFT HEART CATHETERIZATION WITH CORONARY ANGIOGRAM;  Surgeon: Sinclair Grooms, MD;  Location: [facility] CATH LAB;  Service: Cardiovascular;  Laterality: N/A;  . TOTAL KNEE ARTHROPLASTY  08/30/2011   Procedure: TOTAL KNEE ARTHROPLASTY;  Surgeon: Lorn Junes, MD;  Location: [place];  Service: Orthopedics;  Laterality: Left;    Family Psychiatric History: I have reviewed family psychiatric history from my progress note on 05/25/2017.  Family History:  Family History  Problem Relation Age of Onset  . Cancer Mother   . Hypertension Father   . Heart disease Father   . Alcohol abuse Father   . Breast cancer Neg Hx     Social History: I have reviewed social history from my progress note on 05/25/2017. Social History   Socioeconomic History  . Marital status: Divorced    Spouse name: Not on file  . Number of children: 2  . Years of education: 63  . Highest education level: 11th grade  Occupational History    Employer: [employer]  Tobacco Use  . Smoking status: Former Smoker    Packs/day: 2.00    Years: 15.00    Pack years: 30.00    Types: Cigarettes    Quit date: 02/09/1995    Years since quitting: 25.1  . Smokeless tobacco: Never Used  Vaping Use  . Vaping Use: Never used  Substance and Sexual Activity  . Alcohol use: No    Alcohol/week: 0.0 standard drinks  . Drug use: No  . Sexual activity: Not Currently  Other Topics Concern  . Not on file  Social History Narrative   Patient lives at home alone. Patient works at [employer].   Caffeine daily- 2   Right handed.   Education- 11 th grade   Social Determinants of Radio broadcast assistant Strain: Not on file  Food Insecurity: Not on file  Transportation Needs: Not on file  Physical Activity: Not on file  Stress: Not on file  Social Connections: Not on file    Allergies:   Allergies  Allergen Reactions  . Prolixin Decanoate [Fluphenazine]     Ineffective   . Benztropine Other (See Comments)    Hair fall out  Suicide thoughts  . Benztropine Mesylate Other (See Comments)  . Buprenorphine Hcl Other (See Comments)    Unable to void  . Carbidopa-Levodopa Nausea Only  . Codeine Hives  . Cymbalta [Duloxetine Hcl] Other (See Comments)    Altered mental status, Alopecia, visual hallucinations, nightmares  . Gabapentin Swelling  . Lyrica [Pregabalin] Other (See Comments)    Alopecia, visual hallucinations, nightmares Altered mental status  . Morphine And Related Other (See Comments)    Unable to void  . Nortripytline Hcl [Nortriptyline] Other (See Comments)    Hair loss and night mares   . Nsaids     REACTION: palpitations, diaphoresis  . Penicillins Nausea And Vomiting and Other (See Comments)    REACTION: upset stomach  Has patient had a PCN reaction causing immediate rash, facial/tongue/throat swelling, SOB or lightheadedness with hypotension: No Has patient had a PCN reaction causing severe rash involving mucus membranes or skin necrosis: No Has patient had a PCN reaction that required hospitalization: No Has patient had a PCN reaction occurring within the last 10 years: No If all of the above answers are "NO", then may proceed with Cephalosporin use.  . Wellbutrin [Bupropion]     Constipation, mood swings    Metabolic Disorder Labs: Lab Results  Component Value Date   HGBA1C 6.2 (H) 03/06/2017   MPG 131.24 03/06/2017   MPG 108 05/17/2015   Lab Results  Component Value Date   PROLACTIN 64.2 (H) 03/06/2017   Lab Results  Component Value Date   CHOL 146 08/22/2018   TRIG 172 (H) 08/22/2018   HDL 49 08/22/2018   CHOLHDL 3.0 08/22/2018   VLDL 34 08/22/2018   LDLCALC 63 08/22/2018   LDLCALC 59 03/06/2017   Lab Results  Component Value Date   TSH 0.959 08/22/2018   TSH 2.766 08/24/2017    Therapeutic Level Labs: No results found  for: LITHIUM No results found for: VALPROATE No components found for:  CBMZ  Current Medications: Current Outpatient Medications  Medication Sig Dispense Refill  . ACCU-CHEK GUIDE test strip     . acetaminophen (TYLENOL) 500 MG tablet Take 1,000 mg by mouth every 6 (six) hours as needed for moderate pain or headache.     . albuterol (PROVENTIL HFA;VENTOLIN HFA) 108 (90 Base) MCG/ACT inhaler Inhale 2 puffs into the lungs every 6 (six) hours as needed for wheezing or shortness of breath.    . Alcohol Swabs (B-D SINGLE USE SWABS REGULAR) PADS     . amantadine (SYMMETREL) 100 MG capsule Take 1 capsule (100 mg total) by mouth daily. 30 capsule 2  . aspirin EC 81 MG tablet Take 81 mg by mouth daily. Swallow whole.    . Blood Glucose Calibration (ACCU-CHEK AVIVA) SOLN     . Blood Glucose Monitoring Suppl (ACCU-CHEK AVIVA PLUS) w/Device KIT     . budesonide-formoterol (SYMBICORT) 160-4.5 MCG/ACT inhaler Inhale into the lungs.    . Cholecalciferol (VITAMIN D-3) 125 MCG (5000 UT) TABS Take 5,000 Units by mouth daily.    . Cyanocobalamin (B-12 COMPLIANCE INJECTION IJ) Inject 1 Dose as directed every 30 (thirty) days.    Marland Kitchen docusate sodium (COLACE) 50 MG capsule Take 100 mg by mouth 2 (two) times daily.    Marland Kitchen FLUCELVAX QUADRIVALENT 0.5 ML injection     . FLUoxetine (PROZAC) 20 MG capsule Take 1 capsule (20 mg total) by mouth daily. Take along with Fluoxetine 40 mg daily-could take with breakfast 90 capsule 0  . FLUoxetine (PROZAC) 40 MG capsule Take 1 capsule (40 mg total) by mouth daily. 90 capsule 0  . furosemide (LASIX) 20 MG tablet     . HYDROcodone-acetaminophen (NORCO) 7.5-325 MG tablet Take 0.5 tablets by mouth 2 (two) times daily.     . hydroxypropyl methylcellulose / hypromellose (ISOPTO TEARS / GONIOVISC) 2.5 % ophthalmic solution Place 1 drop into both eyes every 4 (four) hours as needed for dry eyes.     . insulin isophane & regular human (NOVOLIN 70/30 FLEXPEN) (70-30) 100 UNIT/ML KwikPen  Inject into the skin.    . Insulin Syringe-Needle U-100 (INSULIN SYRINGE 1CC/31GX5/16") 31G X 5/16" 1 ML MISC See admin instructions.    . Lancets Misc. (ACCU-CHEK SOFTCLIX LANCET DEV) KIT Use 1 each 3 (three)  times daily Use as instructed.    Marland Kitchen levothyroxine (SYNTHROID) 50 MCG tablet Take by mouth.    . melatonin 5 MG TABS Take 5 mg by mouth.    . metFORMIN (GLUCOPHAGE) 1000 MG tablet Take 1,000 mg by mouth 2 (two) times daily with a meal.    . montelukast (SINGULAIR) 10 MG tablet Take 10 mg by mouth at bedtime.     Mar[place] Kitchen NOVOLIN 70/30 FLEXPEN (70-30) 100 UNIT/ML KwikPen     . OLANZapine (ZYPREXA) 5 MG tablet Take 0.5 tablets (2.5 mg total) by mouth at bedtime. 90 tablet 0  . omeprazole (PRILOSEC) 40 MG capsule Take 40 mg by mouth daily as needed (heartburn).     . ondansetron (ZOFRAN) 4 MG tablet Take 4 mg by mouth every 8 (eight) hours as needed for nausea or vomiting.    . pravastatin (PRAVACHOL) 40 MG tablet Take 40 mg by mouth at bedtime.     Marland Kitchen RELION INSULIN SYRINGE 31G X 15/64" 1 ML MISC     . rizatriptan (MAXALT) 10 MG tablet Take 10 mg by mouth 2 (two) times daily as needed for migraine. May repeat in 2 hours if needed    . rOPINIRole (REQUIP) 0.5 MG tablet Take 0.5 mg by mouth 2 (two) times daily.    Marland Kitchen torsemide (DEMADEX) 20 MG tablet Take 20 mg by mouth every other day.     . traMADol (ULTRAM) 50 MG tablet Take 2 tablets (100 mg total) by mouth 2 (two) times daily for 15 days. (Patient taking differently: Take 50-100 mg by mouth See admin instructions. Take 2 tablets (100 mg) by mouth in the morning, 1 tablet (50 mg) by mouth in the evening, & 1 tablet (50 mg) by mouth at night.) 30 tablet 1  . verapamil (CALAN-SR) 180 MG CR tablet Take 180 mg by mouth at bedtime.    . clindamycin (CLEOCIN) 300 MG capsule Take 600 mg by mouth See admin instructions. Take 600 mg 1 hour prior to dental work (Patient not taking: Reported on 04/08/2020)    . cyclobenzaprine (FLEXERIL) 5 MG tablet Take 5 mg by  mouth 2 (two) times daily. (Patient not taking: Reported on 04/08/2020)    . diphenhydrAMINE (BENADRYL) 25 MG tablet Take 25 mg by mouth daily as needed for allergies.  (Patient not taking: Reported on 04/08/2020)    . glimepiride (AMARYL) 2 MG tablet Take 2 mg by mouth 2 (two) times daily.  (Patient not taking: Reported on 04/08/2020)    . levothyroxine (SYNTHROID) 25 MCG tablet Take by mouth. (Patient not taking: Reported on 04/08/2020)    . metFORMIN (GLUCOPHAGE) 500 MG tablet Take 1 tablet (500 mg total) by mouth 2 (two) times daily with a meal. For diabetes management (Patient not taking: Reported on 04/08/2020) 10 tablet 0  . nitrofurantoin, macrocrystal-monohydrate, (MACROBID) 100 MG capsule Take 100 mg by mouth 2 (two) times daily. (Patient not taking: Reported on 04/08/2020)    . oxybutynin (DITROPAN) 5 MG tablet 1 tab tid prn frequency,urgency, bladder spasm (Patient not taking: Reported on 04/08/2020) 30 tablet 0  . predniSONE (STERAPRED UNI-PAK 21 TAB) 10 MG (21) TBPK tablet Take by mouth as directed. (Patient not taking: Reported on 04/08/2020)    . rOPINIRole (REQUIP) 0.25 MG tablet Take by mouth. (Patient not taking: Reported on 04/08/2020)     No current facility-administered medications for this visit.     Musculoskeletal: Strength & Muscle Tone: UTA Gait & Station: Walks with cane Patient leans:  N/A  Psychiatric Specialty Exam: Review of Systems  HENT:       Neck mass  Neurological: Positive for tremors.  Psychiatric/Behavioral: Positive for hallucinations. The patient is nervous/anxious.   All other systems reviewed and are negative.   Blood pressure 135/72, pulse 89, temperature 97.8 F (36.6 C), temperature source Temporal.There is no height or weight on file to calculate BMI.  General Appearance: Casual  Eye Contact:  Fair  Speech:  Clear and Coherent  Volume:  Normal  Mood:  Anxious  Affect:  Congruent  Thought Process:  Goal Directed and Descriptions of Associations: Intact   Orientation:  Full (Time, Place, and Person)  Thought Content: Hallucinations: Auditory noises,static  Suicidal Thoughts:  No  Homicidal Thoughts:  No  Memory:  Immediate;   Fair Recent;   Fair Remote;   Fair  Judgement:  Fair  Insight:  Fair  Psychomotor Activity:  Tremor  Concentration:  Concentration: Fair and Attention Span: Fair  Recall:  [employer] of Knowledge: Fair  Language: Fair  Akathisia:  No  Handed:  Right  AIMS (if indicated): done  Assets:  Communication Skills Desire for Improvement Social Support  ADL's:  Intact  Cognition: WNL  Sleep:  Fair   Screenings: [place] Office Visit from 04/08/2020 in [place] Admission (Discharged) from OP Visit from 08/30/2018 in [place] Admission (Discharged) from 08/22/2018 in [place] [facility] Office Visit from 12/01/2017 in [place] Office Visit from 09/20/2017 in [place] Total Score 10 0 0 1 0    AUDIT   Flowsheet Row Admission (Discharged) from OP Visit from 08/30/2018 in [place] Admission (Discharged) from 08/22/2018 in [place] [facility] Admission (Discharged) from 03/03/2017 in [place] [facility]  Alcohol Use Disorder Identification Test Final Score (AUDIT) 0 0 0    GAD-7   Flowsheet Row Office Visit from 08/22/2015 in [facility]  Total GAD-7 Score 6    PHQ2-9   [place] Visit from 04/08/2020 in [place] Video Visit from 03/26/2020 in [place] Visit from 03/23/2016 in [place] Office Visit from 08/22/2015 in [place] [facility] [place] from 08/06/2015 in [place]  PHQ-2 Total Score 0 0 0 1  0    [place] Office Visit from 04/08/2020 in [place] Video Visit from 03/26/2020 in [place] Admission (Discharged) from [place] from 08/30/2018 in [place] No Risk No Risk No Risk       Assessment and Plan: ZENIYA LAPIDUS is a 60 year old Caucasian female who has a history of schizoaffective disorder, PTSD, migraine headaches, drug-induced Parkinson's disease was evaluated in office today.  Patient with recent lump in her neck currently undergoing diagnostic testing and imaging, reports worsening anxiety symptoms.  She will benefit from the following plan.  Plan Schizoaffective disorder-stable Continue Zyprexa 2.5 mg p.o. nightly. She however is interested in discontinuing Zyprexa due to side effects of weight gain.  Discussed Navane however I would like to review her EKG prior to starting this medication.  GAD-unstable Will increase fluoxetine to 60 mg p.o. daily  PTSD-stable Will monitor closely  Insomnia due to medical problems-improving Continue Zyprexa at reduced dosage. Melatonin 3 to 5  mg p.o. nightly as needed  Drug-induced Parkinson's disease rule out tardive dyskinesia [place] today-10 Patient continues to have tremors of her left-sided arm and forearm as well as right legs. We will consider medication change if her symptoms worsen. Discussed [place].  Patient to sign a release to obtain medical records-EKG.  Once I review the EKG Will consider starting Navane and stopping Zyprexa due to her concerns of adverse side effects of weight gain.  I have reviewed medical records from endocrinologist-Dr. Neale Burly 04/02/2020-continue Metformin 1000 mg p.o. daily for diabetes.  Continue Novolin 70/30. I have reviewed Dr. Linton Ham notes dated 04/02/2020-swelling, mass lump in head and neck-ultrasound soft tissue head and neck-pending.  Follow-up in clinic in 2  weeks or sooner if needed.   I have spent atleast 30 minutes with patient today. More than 50 % of the time was spent for preparing to see the patient ( e.g., review of test, records ), obtaining and to review and separately obtained history , ordering medications and test ,psychoeducation and supportive psychotherapy and care coordination,as well as documenting clinical information in electronic health record.   This note was generated in part or whole with voice recognition software. Voice recognition is usually quite accurate but there are transcription errors that can and very often do occur. I apologize for any typographical errors that were not detected and corrected.           Ursula Alert, MD 04/08/2020, 9:20 PM

## 2020-04-09 ENCOUNTER — Telehealth: Payer: Self-pay | Admitting: Psychiatry

## 2020-04-09 DIAGNOSIS — F251 Schizoaffective disorder, depressive type: Secondary | ICD-10-CM

## 2020-04-09 MED ORDER — THIOTHIXENE 1 MG PO CAPS
1.0000 mg | ORAL_CAPSULE | Freq: Every day | ORAL | 1 refills | Status: DC
Start: 2020-04-09 — End: 2020-04-30

## 2020-04-09 NOTE — Telephone Encounter (Signed)
I have reviewed EKG-within normal limits.  We will stop olanzapine due to adverse side effects of weight gain.  We will start Navane at a very low dose of 1 mg at bedtime.  Discussed with patient.  Will monitor for side effects.  We will gradually increase the dosage as needed.

## 2020-04-15 ENCOUNTER — Other Ambulatory Visit: Payer: Self-pay | Admitting: Internal Medicine

## 2020-04-15 DIAGNOSIS — R221 Localized swelling, mass and lump, neck: Secondary | ICD-10-CM

## 2020-04-22 ENCOUNTER — Telehealth (INDEPENDENT_AMBULATORY_CARE_PROVIDER_SITE_OTHER): Payer: Medicare HMO | Admitting: Psychiatry

## 2020-04-22 ENCOUNTER — Encounter: Payer: Self-pay | Admitting: Psychiatry

## 2020-04-22 ENCOUNTER — Other Ambulatory Visit: Payer: Self-pay

## 2020-04-22 DIAGNOSIS — F251 Schizoaffective disorder, depressive type: Secondary | ICD-10-CM | POA: Diagnosis not present

## 2020-04-22 DIAGNOSIS — G4701 Insomnia due to medical condition: Secondary | ICD-10-CM | POA: Diagnosis not present

## 2020-04-22 DIAGNOSIS — G2119 Other drug induced secondary parkinsonism: Secondary | ICD-10-CM | POA: Diagnosis not present

## 2020-04-22 DIAGNOSIS — F411 Generalized anxiety disorder: Secondary | ICD-10-CM | POA: Diagnosis not present

## 2020-04-22 DIAGNOSIS — F4310 Post-traumatic stress disorder, unspecified: Secondary | ICD-10-CM

## 2020-04-22 MED ORDER — TEMAZEPAM 7.5 MG PO CAPS: 7.5000 mg | ORAL_CAPSULE | Freq: Every evening | ORAL | 1 refills | Status: DC | PRN

## 2020-04-22 NOTE — Progress Notes (Signed)
Virtual Visit via Telephone Note  I connected with Heather Haley on 04/22/20 at  1:40 PM EDT by telephone and verified that I am speaking with the correct person using two identifiers.  Location Provider Location : [facility] Patient Location : Home  Participants: Patient , Provider   I discussed the limitations, risks, security and privacy concerns of performing an evaluation and management service by telephone and the availability of in person appointments. I also discussed with the patient that there may be a patient responsible charge related to this service. The patient expressed understanding and agreed to proceed.    I discussed the assessment and treatment plan with the patient. The patient was provided an opportunity to ask questions and all were answered. The patient agreed with the plan and demonstrated an understanding of the instructions.   The patient was advised to call back or seek an in-person evaluation if the symptoms worsen or if the condition fails to improve as anticipated.   Reform MD OP Progress Note  04/22/2020 2:05 PM Heather Haley  MRN:  [id number]  Chief Complaint:  Chief Complaint    Follow-up; Hallucinations     HPI: Heather Haley is a 61 year old Caucasian female, lives in [place], has a history of schizoaffective disorder, PTSD, GAD, drug-induced Parkinson's disease, headache, memory problems, diabetes melitis, chronic pain was evaluated by telemedicine today.  Patient today reports she is tolerating the Navane well.  She denies any hallucinations at this time and reports that the Navane has helped with that.  Patient however does report she does have a headache.  She however has history of seasonal allergies and is not sure if the medication is causing it or not.  Patient reports her anxiety symptoms are improving.  She denies any sadness or crying spells.  Patient currently struggles with sleep.  She has tried medications like trazodone,  Ambien in the past.  She reports she wakes up several times since her sleep is interrupted.  Patient denies any suicidality or homicidality.  Patient denies any other concerns today.  Visit Diagnosis:    ICD-10-CM   1. Schizoaffective disorder, depressive type ([place])  F25.1   2. PTSD (post-traumatic stress disorder)  F43.10 temazepam (RESTORIL) 7.5 MG capsule  3. Generalized anxiety disorder  F41.1   4. Insomnia due to medical condition  G47.01   5. Drug-induced Parkinson's disease ([place])  G21.19     Past Psychiatric History: I have reviewed past psychiatric history from my progress note on 05/25/2017.  Past Medical History:  Past Medical History:  Diagnosis Date  . Anxiety   . Asthma   . Chronic kidney disease   . Chronic pain    previously saw Dr. Consuela Mimes in pain clinic, then saw pain specialist in [place]  . Depression   . Diabetes mellitus ([place])   . Frequency of urination   . GERD (gastroesophageal reflux disease)   . Headache(784.0)   . High cholesterol   . History of kidney stones   . Hypertension   . IBS (irritable bowel syndrome)   . Left ankle instability   . Left knee DJD   . Lumbar Degenerative Disc Disease of  10/11/2014  . Neuromuscular disorder ([place])   . Osteoarthritis of hip (Right) 05/05/2015  . Other enthesopathy of ankle and tarsus 12/15/2009   Qualifier: Diagnosis of  By: Oneida Alar MD, KARL    . Parkinson's disease ([place])   . Peripheral sensory neuropathy (Bilateral) 11/19/2014  . Postoperative nausea and vomiting   .  Schizophrenia [facility])     Past Surgical History:  Procedure Laterality Date  . ABDOMINAL HYSTERECTOMY    . ANKLE SURGERY Left    x 2  . APPENDECTOMY    . CARDIAC CATHETERIZATION    . COLONOSCOPY  2013  . COLONOSCOPY WITH PROPOFOL N/A 04/25/2017   Procedure: COLONOSCOPY WITH PROPOFOL;  Surgeon: Manya Silvas, MD;  Location: [facility] ENDOSCOPY;  Service: Endoscopy;  Laterality: N/A;  . CYSTOSCOPY/URETEROSCOPY/HOLMIUM LASER/STENT PLACEMENT  Left 09/11/2019   Procedure: CYSTOSCOPY/URETEROSCOPY/HOLMIUM LASER/STENT PLACEMENT;  Surgeon: Abbie Sons, MD;  Location: [facility] ORS;  Service: Urology;  Laterality: Left;  . ESOPHAGOGASTRODUODENOSCOPY (EGD) WITH PROPOFOL N/A 10/03/2014   Procedure: ESOPHAGOGASTRODUODENOSCOPY (EGD) WITH PROPOFOL;  Surgeon: Josefine Class, MD;  Location: [facility] ENDOSCOPY;  Service: Endoscopy;  Laterality: N/A;  . ESOPHAGOGASTRODUODENOSCOPY (EGD) WITH PROPOFOL  04/25/2017   Procedure: ESOPHAGOGASTRODUODENOSCOPY (EGD) WITH PROPOFOL;  Surgeon: Manya Silvas, MD;  Location: [facility] ENDOSCOPY;  Service: Endoscopy;;  . JOINT REPLACEMENT Left    knee  . KNEE ARTHROSCOPY  1997   left knee  . LEFT HEART CATH AND CORONARY ANGIOGRAPHY Left 11/10/2017   Procedure: LEFT HEART CATH AND CORONARY ANGIOGRAPHY;  Surgeon: Isaias Cowman, MD;  Location: [place] CV LAB;  Service: Cardiovascular;  Laterality: Left;  . LEFT HEART CATHETERIZATION WITH CORONARY ANGIOGRAM N/A 01/11/2013   Procedure: LEFT HEART CATHETERIZATION WITH CORONARY ANGIOGRAM;  Surgeon: Sinclair Grooms, MD;  Location: [facility] CATH LAB;  Service: Cardiovascular;  Laterality: N/A;  . TOTAL KNEE ARTHROPLASTY  08/30/2011   Procedure: TOTAL KNEE ARTHROPLASTY;  Surgeon: Lorn Junes, MD;  Location: [place];  Service: Orthopedics;  Laterality: Left;    Family Psychiatric History: I have reviewed family psychiatric history from my progress note on 05/25/2017.  Family History:  Family History  Problem Relation Age of Onset  . Cancer Mother   . Hypertension Father   . Heart disease Father   . Alcohol abuse Father   . Breast cancer Neg Hx     Social History: I have reviewed social history from my progress note on 05/25/2017. Social History   Socioeconomic History  . Marital status: Divorced    Spouse name: Not on file  . Number of children: 2  . Years of education: 34  . Highest education level: 11th grade  Occupational History    Employer: [employer]  Tobacco Use  . Smoking status: Former Smoker    Packs/day: 2.00    Years: 15.00    Pack years: 30.00    Types: Cigarettes    Quit date: 02/09/1995    Years since quitting: 25.2  . Smokeless tobacco: Never Used  Vaping Use  . Vaping Use: Never used  Substance and Sexual Activity  . Alcohol use: No    Alcohol/week: 0.0 standard drinks  . Drug use: No  . Sexual activity: Not Currently  Other Topics Concern  . Not on file  Social History Narrative   Patient lives at home alone. Patient works at [employer].   Caffeine daily- 2   Right handed.   Education- 11 th grade   Social Determinants of Radio broadcast assistant Strain: Not on file  Food Insecurity: Not on file  Transportation Needs: Not on file  Physical Activity: Not on file  Stress: Not on file  Social Connections: Not on file    Allergies:  Allergies  Allergen Reactions  . Prolixin Decanoate [Fluphenazine]     Ineffective   . Benztropine Other (  See Comments)    Hair fall out  Suicide thoughts  . Benztropine Mesylate Other (See Comments)  . Buprenorphine Hcl Other (See Comments)    Unable to void  . Carbidopa-Levodopa Nausea Only  . Codeine Hives  . Cymbalta [Duloxetine Hcl] Other (See Comments)    Altered mental status, Alopecia, visual hallucinations, nightmares  . Gabapentin Swelling  . Lyrica [Pregabalin] Other (See Comments)    Alopecia, visual hallucinations, nightmares Altered mental status  . Morphine And Related Other (See Comments)    Unable to void  . Nortripytline Hcl [Nortriptyline] Other (See Comments)    Hair loss and night mares   . Nsaids     REACTION: palpitations, diaphoresis  . Penicillins Nausea And Vomiting and Other (See Comments)    REACTION: upset stomach Has patient had a PCN reaction causing immediate rash, facial/tongue/throat swelling, SOB or lightheadedness with hypotension: No Has patient had a PCN reaction causing severe rash involving mucus membranes or  skin necrosis: No Has patient had a PCN reaction that required hospitalization: No Has patient had a PCN reaction occurring within the last 10 years: No If all of the above answers are "NO", then may proceed with Cephalosporin use.  . Wellbutrin [Bupropion]     Constipation, mood swings    Metabolic Disorder Labs: Lab Results  Component Value Date   HGBA1C 6.2 (H) 03/06/2017   MPG 131.24 03/06/2017   MPG 108 05/17/2015   Lab Results  Component Value Date   PROLACTIN 64.2 (H) 03/06/2017   Lab Results  Component Value Date   CHOL 146 08/22/2018   TRIG 172 (H) 08/22/2018   HDL 49 08/22/2018   CHOLHDL 3.0 08/22/2018   VLDL 34 08/22/2018   LDLCALC 63 08/22/2018   LDLCALC 59 03/06/2017   Lab Results  Component Value Date   TSH 0.959 08/22/2018   TSH 2.766 08/24/2017    Therapeutic Level Labs: No results found for: LITHIUM No results found for: VALPROATE No components found for:  CBMZ  Current Medications: Current Outpatient Medications  Medication Sig Dispense Refill  . temazepam (RESTORIL) 7.5 MG capsule Take 1 capsule (7.5 mg total) by mouth at bedtime as needed for sleep. 15 capsule 1  . ACCU-CHEK GUIDE test strip     . acetaminophen (TYLENOL) 500 MG tablet Take 1,000 mg by mouth every 6 (six) hours as needed for moderate pain or headache.     . albuterol (PROVENTIL HFA;VENTOLIN HFA) 108 (90 Base) MCG/ACT inhaler Inhale 2 puffs into the lungs every 6 (six) hours as needed for wheezing or shortness of breath.    . Alcohol Swabs (B-D SINGLE USE SWABS REGULAR) PADS     . amantadine (SYMMETREL) 100 MG capsule Take 1 capsule (100 mg total) by mouth daily. 30 capsule 2  . aspirin EC 81 MG tablet Take 81 mg by mouth daily. Swallow whole.    . Blood Glucose Calibration (ACCU-CHEK AVIVA) SOLN     . Blood Glucose Monitoring Suppl (ACCU-CHEK AVIVA PLUS) w/Device KIT     . budesonide-formoterol (SYMBICORT) 160-4.5 MCG/ACT inhaler Inhale into the lungs.    . Cholecalciferol  (VITAMIN D-3) 125 MCG (5000 UT) TABS Take 5,000 Units by mouth daily.    . clindamycin (CLEOCIN) 300 MG capsule Take 600 mg by mouth See admin instructions. Take 600 mg 1 hour prior to dental work (Patient not taking: Reported on 04/08/2020)    . Cyanocobalamin (B-12 COMPLIANCE INJECTION IJ) Inject 1 Dose as directed every 30 (thirty) days.    Marland Kitchen  cyclobenzaprine (FLEXERIL) 5 MG tablet Take 5 mg by mouth 2 (two) times daily. (Patient not taking: Reported on 04/08/2020)    . diphenhydrAMINE (BENADRYL) 25 MG tablet Take 25 mg by mouth daily as needed for allergies.  (Patient not taking: Reported on 04/08/2020)    . docusate sodium (COLACE) 50 MG capsule Take 100 mg by mouth 2 (two) times daily.    Marland Kitchen FLUCELVAX QUADRIVALENT 0.5 ML injection     . FLUoxetine (PROZAC) 20 MG capsule Take 1 capsule (20 mg total) by mouth daily. Take along with Fluoxetine 40 mg daily-could take with breakfast 90 capsule 0  . FLUoxetine (PROZAC) 40 MG capsule Take 1 capsule (40 mg total) by mouth daily. 90 capsule 0  . furosemide (LASIX) 20 MG tablet     . glimepiride (AMARYL) 2 MG tablet Take 2 mg by mouth 2 (two) times daily.  (Patient not taking: Reported on 04/08/2020)    . HYDROcodone-acetaminophen (NORCO) 7.5-325 MG tablet Take 0.5 tablets by mouth 2 (two) times daily.     . hydroxypropyl methylcellulose / hypromellose (ISOPTO TEARS / GONIOVISC) 2.5 % ophthalmic solution Place 1 drop into both eyes every 4 (four) hours as needed for dry eyes.     . insulin isophane & regular human (NOVOLIN 70/30 FLEXPEN) (70-30) 100 UNIT/ML KwikPen Inject into the skin.    . Insulin Syringe-Needle U-100 (INSULIN SYRINGE 1CC/31GX5/16") 31G X 5/16" 1 ML MISC See admin instructions.    . Lancets Misc. (ACCU-CHEK SOFTCLIX LANCET DEV) KIT Use 1 each 3 (three) times daily Use as instructed.    Marland Kitchen levothyroxine (SYNTHROID) 25 MCG tablet Take by mouth. (Patient not taking: Reported on 04/08/2020)    . levothyroxine (SYNTHROID) 50 MCG tablet Take by mouth.     . melatonin 5 MG TABS Take 5 mg by mouth.    . metFORMIN (GLUCOPHAGE) 1000 MG tablet Take 1,000 mg by mouth 2 (two) times daily with a meal.    . metFORMIN (GLUCOPHAGE) 500 MG tablet Take 1 tablet (500 mg total) by mouth 2 (two) times daily with a meal. For diabetes management (Patient not taking: Reported on 04/08/2020) 10 tablet 0  . montelukast (SINGULAIR) 10 MG tablet Take 10 mg by mouth at bedtime.     . nitrofurantoin, macrocrystal-monohydrate, (MACROBID) 100 MG capsule Take 100 mg by mouth 2 (two) times daily. (Patient not taking: Reported on 04/08/2020)    . NOVOLIN 70/30 FLEXPEN (70-30) 100 UNIT/ML KwikPen     . omeprazole (PRILOSEC) 40 MG capsule Take 40 mg by mouth daily as needed (heartburn).     . ondansetron (ZOFRAN) 4 MG tablet Take 4 mg by mouth every 8 (eight) hours as needed for nausea or vomiting.    Marland Kitchen oxybutynin (DITROPAN) 5 MG tablet 1 tab tid prn frequency,urgency, bladder spasm (Patient not taking: Reported on 04/08/2020) 30 tablet 0  . pravastatin (PRAVACHOL) 40 MG tablet Take 40 mg by mouth at bedtime.     . predniSONE (STERAPRED UNI-PAK 21 TAB) 10 MG (21) TBPK tablet Take by mouth as directed. (Patient not taking: Reported on 04/08/2020)    . RELION INSULIN SYRINGE 31G X 15/64" 1 ML MISC     . rizatriptan (MAXALT) 10 MG tablet Take 10 mg by mouth 2 (two) times daily as needed for migraine. May repeat in 2 hours if needed    . rOPINIRole (REQUIP) 0.25 MG tablet Take by mouth. (Patient not taking: Reported on 04/08/2020)    . rOPINIRole (REQUIP) 0.5 MG tablet  Take 0.5 mg by mouth 2 (two) times daily.    Marland Kitchen thiothixene (NAVANE) 1 MG capsule Take 1 capsule (1 mg total) by mouth at bedtime. 30 capsule 1  . torsemide (DEMADEX) 20 MG tablet Take 20 mg by mouth every other day.     . traMADol (ULTRAM) 50 MG tablet Take 2 tablets (100 mg total) by mouth 2 (two) times daily for 15 days. (Patient taking differently: Take 50-100 mg by mouth See admin instructions. Take 2 tablets (100 mg) by  mouth in the morning, 1 tablet (50 mg) by mouth in the evening, & 1 tablet (50 mg) by mouth at night.) 30 tablet 1  . verapamil (CALAN-SR) 180 MG CR tablet Take 180 mg by mouth at bedtime.     No current facility-administered medications for this visit.     Musculoskeletal: Strength & Muscle Tone: UTA Gait & Station: UTA Patient leans: N/A  Psychiatric Specialty Exam: Review of Systems  Psychiatric/Behavioral: Positive for sleep disturbance. The patient is nervous/anxious.   All other systems reviewed and are negative.   There were no vitals taken for this visit.There is no height or weight on file to calculate BMI.  General Appearance: UTA  Eye Contact:  UTA  Speech:  Clear and Coherent  Volume:  Normal  Mood:  Anxious  Affect:  UTA  Thought Process:  Goal Directed and Descriptions of Associations: Intact  Orientation:  Full (Time, Place, and Person)  Thought Content: Logical   Suicidal Thoughts:  No  Homicidal Thoughts:  No  Memory:  Immediate;   Fair Recent;   Fair Remote;   Fair  Judgement:  Fair  Insight:  Fair  Psychomotor Activity:  UTA  Concentration:  Concentration: Fair and Attention Span: Fair  Recall:  [employer] of Knowledge: Fair  Language: Fair  Akathisia:  No  Handed:  Right  AIMS (if indicated): UTA  Assets:  Communication Skills Desire for Improvement Housing Social Support  ADL's:  Intact  Cognition: WNL  Sleep:  Poor   Screenings: [place] Office Visit from 04/08/2020 in [place] Admission (Discharged) from [place] from 08/30/2018 in [place] Admission (Discharged) from 08/22/2018 in [facility] [facility] Office Visit from 12/01/2017 in [place] Office Visit from 09/20/2017 in [place] Total Score 10 0 0 1 0    AUDIT   Flowsheet Row Admission (Discharged) from OP Visit from 08/30/2018  in [place] Admission (Discharged) from 08/22/2018 in [place] [facility] Admission (Discharged) from 03/03/2017 in [facility] [place] [facility]  Alcohol Use Disorder Identification Test Final Score (AUDIT) 0 0 0    GAD-7   Flowsheet Row Office Visit from 08/22/2015 in [place] at [facility]  Total GAD-7 Score 6    [place] Visit from 04/08/2020 in [place] Video Visit from 03/26/2020 in [place] Visit from 03/23/2016 in [place] Office Visit from 08/22/2015 in [place] at [place] from 08/06/2015 in [facility]  PHQ-2 Total Score 0 0 0 1 0    [place] Office Visit from 04/08/2020 in [place] Video Visit from 03/26/2020 in [place] Admission (Discharged) from [place] from 08/30/2018 in [place] No  Risk No Risk No Risk       Assessment and Plan: ALANEE TING is a 60 year old Caucasian female who has a history of schizoaffective disorder, PTSD, migraine headaches, drug-induced Parkinson's disease was evaluated by telemedicine today.  Patient is currently making progress.  Plan as noted below.  Plan Schizoaffective disorder-stable Navane 1 mg at bedtime.   GAD-improving Fluoxetine 60 mg p.o. daily   PTSD-stable Will monitor closely  Insomnia due to medical problems-improving Melatonin 3 to 5 mg p.o. nightly as needed   Drug-induced Parkinson's disease rule out tardive dyskinesia We will monitor closely. She continues to have tremors of her left sided arm and forearm. She will let writer know if she is interested in [place] which was discussed in previous session.  Follow-up in clinic in 1 month or  sooner if needed.   I have spent at least 20-minutes non face to face  with patient today which includes the time spent for preparing to see the patient  (e.g., review of test, records), ordering medications and test, psychoeducation and supportive psychotherapy, care coordination as well as documenting clinical information in electronic health record.   This note was generated in part or whole with voice recognition software. Voice recognition is usually quite accurate but there are transcription errors that can and very often do occur. I apologize for any typographical errors that were not detected and corrected.     Ursula Alert, MD 04/23/2020, 10:29 AM

## 2020-04-23 ENCOUNTER — Telehealth: Payer: Self-pay

## 2020-04-23 ENCOUNTER — Telehealth: Payer: Self-pay | Admitting: Psychiatry

## 2020-04-23 DIAGNOSIS — F4310 Post-traumatic stress disorder, unspecified: Secondary | ICD-10-CM

## 2020-04-23 MED ORDER — TRAZODONE HCL 100 MG PO TABS: 50.0000 mg | ORAL_TABLET | Freq: Every evening | ORAL | 1 refills | Status: DC | PRN

## 2020-04-23 NOTE — Telephone Encounter (Signed)
pt called left a message that [facility] will cost her $137 she can not afford that. is there something cheaper she can take

## 2020-04-23 NOTE — Telephone Encounter (Signed)
Returned call to patient.  Discussed that she can be started on trazodone since she is not able to afford the temazepam.  She is willing to give it a try.  She does not remember having a side effect to it in the past.  Start trazodone 50 to 100 mg at bedtime as needed.  Provided medication education.

## 2020-04-25 ENCOUNTER — Ambulatory Visit
Admission: RE | Admit: 2020-04-25 | Discharge: 2020-04-25 | Disposition: A | Discharge status: Home / Self Care | Payer: Medicare HMO | Source: Ambulatory Visit | Attending: Internal Medicine | Admitting: Internal Medicine

## 2020-04-25 ENCOUNTER — Other Ambulatory Visit: Payer: Self-pay

## 2020-04-25 DIAGNOSIS — R221 Localized swelling, mass and lump, neck: Secondary | ICD-10-CM | POA: Diagnosis not present

## 2020-04-25 DIAGNOSIS — E041 Nontoxic single thyroid nodule: Secondary | ICD-10-CM | POA: Diagnosis not present

## 2020-04-30 ENCOUNTER — Telehealth: Payer: Self-pay

## 2020-04-30 ENCOUNTER — Other Ambulatory Visit: Payer: Self-pay | Admitting: Psychiatry

## 2020-04-30 DIAGNOSIS — F251 Schizoaffective disorder, depressive type: Secondary | ICD-10-CM

## 2020-04-30 DIAGNOSIS — G2119 Other drug induced secondary parkinsonism: Secondary | ICD-10-CM

## 2020-04-30 MED ORDER — OLANZAPINE 2.5 MG PO TABS: 2.5000 mg | ORAL_TABLET | Freq: Every day | ORAL | 0 refills | Status: DC

## 2020-04-30 NOTE — Telephone Encounter (Signed)
pt called states that the new medication you put her on is causing her to have tremors in both legs. she cant take it.

## 2020-04-30 NOTE — Telephone Encounter (Signed)
Returned call to patient.  Discussed to stop Navane.  Will restart Zyprexa 2.5 mg.

## 2020-05-05 DIAGNOSIS — K219 Gastro-esophageal reflux disease without esophagitis: Secondary | ICD-10-CM | POA: Diagnosis not present

## 2020-05-05 DIAGNOSIS — E538 Deficiency of other specified B group vitamins: Secondary | ICD-10-CM | POA: Diagnosis not present

## 2020-05-05 DIAGNOSIS — R221 Localized swelling, mass and lump, neck: Secondary | ICD-10-CM | POA: Diagnosis not present

## 2020-05-09 ENCOUNTER — Other Ambulatory Visit: Payer: Self-pay | Admitting: Otolaryngology

## 2020-05-09 DIAGNOSIS — D170 Benign lipomatous neoplasm of skin and subcutaneous tissue of head, face and neck: Secondary | ICD-10-CM

## 2020-05-09 NOTE — Progress Notes (Signed)
Patient on schedule for Neck mass Ln biopsy 05/10/2020,called and spoke with patient, does not want sedation, thus instructed to not be NPO, may be here @ 0900, and driver not required. Stated understanding.

## 2020-05-12 ENCOUNTER — Other Ambulatory Visit: Payer: Self-pay | Admitting: Radiology

## 2020-05-13 ENCOUNTER — Ambulatory Visit
Admission: RE | Admit: 2020-05-13 | Discharge: 2020-05-13 | Disposition: A | Discharge status: Home / Self Care | Payer: Medicare HMO | Source: Ambulatory Visit | Attending: Otolaryngology | Admitting: Otolaryngology

## 2020-05-13 ENCOUNTER — Other Ambulatory Visit: Payer: Self-pay

## 2020-05-13 ENCOUNTER — Other Ambulatory Visit: Payer: Self-pay | Admitting: Otolaryngology

## 2020-05-13 DIAGNOSIS — D170 Benign lipomatous neoplasm of skin and subcutaneous tissue of head, face and neck: Secondary | ICD-10-CM | POA: Diagnosis not present

## 2020-05-13 DIAGNOSIS — R221 Localized swelling, mass and lump, neck: Secondary | ICD-10-CM | POA: Diagnosis not present

## 2020-05-13 DIAGNOSIS — K118 Other diseases of salivary glands: Secondary | ICD-10-CM | POA: Diagnosis not present

## 2020-05-14 ENCOUNTER — Telehealth (INDEPENDENT_AMBULATORY_CARE_PROVIDER_SITE_OTHER): Payer: Medicare HMO | Admitting: Psychiatry

## 2020-05-14 DIAGNOSIS — F251 Schizoaffective disorder, depressive type: Secondary | ICD-10-CM

## 2020-05-14 NOTE — Progress Notes (Signed)
No response to call or text or video invite  

## 2020-05-15 ENCOUNTER — Telehealth (INDEPENDENT_AMBULATORY_CARE_PROVIDER_SITE_OTHER): Payer: Medicare HMO | Admitting: Psychiatry

## 2020-05-15 ENCOUNTER — Encounter: Payer: Self-pay | Admitting: Psychiatry

## 2020-05-15 ENCOUNTER — Other Ambulatory Visit: Payer: Self-pay

## 2020-05-15 DIAGNOSIS — F411 Generalized anxiety disorder: Secondary | ICD-10-CM | POA: Diagnosis not present

## 2020-05-15 DIAGNOSIS — G4701 Insomnia due to medical condition: Secondary | ICD-10-CM

## 2020-05-15 DIAGNOSIS — G2119 Other drug induced secondary parkinsonism: Secondary | ICD-10-CM

## 2020-05-15 DIAGNOSIS — F251 Schizoaffective disorder, depressive type: Secondary | ICD-10-CM | POA: Diagnosis not present

## 2020-05-15 DIAGNOSIS — F4310 Post-traumatic stress disorder, unspecified: Secondary | ICD-10-CM | POA: Diagnosis not present

## 2020-05-15 MED ORDER — AMANTADINE HCL 100 MG PO CAPS: 100.0000 mg | ORAL_CAPSULE | Freq: Two times a day (BID) | ORAL | 1 refills | Status: DC

## 2020-05-15 NOTE — Progress Notes (Signed)
Virtual Visit via Telephone Note  I connected with Heather Haley on 05/15/20 at 10:20 AM EDT by telephone and verified that I am speaking with the correct person using two identifiers.  Location Provider Location : [facility] Patient Location : Home  Participants: Patient , Provider   I discussed the limitations, risks, security and privacy concerns of performing an evaluation and management service by telephone and the availability of in person appointments. I also discussed with the patient that there may be a patient responsible charge related to this service. The patient expressed understanding and agreed to proceed.   I discussed the assessment and treatment plan with the patient. The patient was provided an opportunity to ask questions and all were answered. The patient agreed with the plan and demonstrated an understanding of the instructions.   The patient was advised to call back or seek an in-person evaluation if the symptoms worsen or if the condition fails to improve as anticipated.  [place] MD OP Progress Note  05/15/2020 5:48 PM Heather Haley  MRN:  [id number]  Chief Complaint:  Chief Complaint    Follow-up; Anxiety     HPI: Heather Haley is a 60 year old Caucasian female, lives in [place], has a history of schizoaffective disorder, PTSD, GAD, drug-induced Parkinson's disease, headache, memory problems, diabetes melitis, chronic pain was evaluated by telemedicine today.  Patient today reports she is currently struggling with tremors of her upper and lower extremities.  She reports she started noticing it since she started taking Navane.  She however stopped taking Navane due to having these side effects of tremors however in spite of stopping it her tremors has not gotten better.  She reports she also wonders whether the tremors are worsened by anxiety.  When she does focus on something too long the tremors get worse.  Patient reports she has not noticed much  benefit from the Prozac for her anxiety symptoms.  Patient denies any suicidality, homicidality.  Patient reports sleep is overall okay.  She has chronic hallucinations however she reports she is able to cope with it.  It does not distress her.  Patient denies any other concerns today. Visit Diagnosis:    ICD-10-CM   1. Schizoaffective disorder, depressive type ([place])  F25.1   2. PTSD (post-traumatic stress disorder)  F43.10   3. Generalized anxiety disorder  F41.1   4. Insomnia due to medical condition  G47.01   5. Drug-induced Parkinson's disease ([place])  G21.19 amantadine (SYMMETREL) 100 MG capsule    Past Psychiatric History: I have reviewed past psychiatric history from my progress note on 05/25/2017.  Past Medical History:  Past Medical History:  Diagnosis Date  . Anxiety   . Asthma   . Chronic kidney disease   . Chronic pain    previously saw Dr. Consuela Mimes in pain clinic, then saw pain specialist in [place]  . Depression   . Diabetes mellitus ([place])   . Frequency of urination   . GERD (gastroesophageal reflux disease)   . Headache(784.0)   . High cholesterol   . History of kidney stones   . Hypertension   . IBS (irritable bowel syndrome)   . Left ankle instability   . Left knee DJD   . Lumbar Degenerative Disc Disease of  10/11/2014  . Neuromuscular disorder ([place])   . Osteoarthritis of hip (Right) 05/05/2015  . Other enthesopathy of ankle and tarsus 12/15/2009   Qualifier: Diagnosis of  By: Oneida Alar MD, KARL    . Parkinson's disease (  [place])   . Peripheral sensory neuropathy (Bilateral) 11/19/2014  . Postoperative nausea and vomiting   . Schizophrenia [facility])     Past Surgical History:  Procedure Laterality Date  . ABDOMINAL HYSTERECTOMY    . ANKLE SURGERY Left    x 2  . APPENDECTOMY    . CARDIAC CATHETERIZATION    . COLONOSCOPY  2013  . COLONOSCOPY WITH PROPOFOL N/A 04/25/2017   Procedure: COLONOSCOPY WITH PROPOFOL;  Surgeon: Manya Silvas, MD;  Location: [facility]  ENDOSCOPY;  Service: Endoscopy;  Laterality: N/A;  . CYSTOSCOPY/URETEROSCOPY/HOLMIUM LASER/STENT PLACEMENT Left 09/11/2019   Procedure: CYSTOSCOPY/URETEROSCOPY/HOLMIUM LASER/STENT PLACEMENT;  Surgeon: Abbie Sons, MD;  Location: [facility] ORS;  Service: Urology;  Laterality: Left;  . ESOPHAGOGASTRODUODENOSCOPY (EGD) WITH PROPOFOL N/A 10/03/2014   Procedure: ESOPHAGOGASTRODUODENOSCOPY (EGD) WITH PROPOFOL;  Surgeon: Josefine Class, MD;  Location: [facility] ENDOSCOPY;  Service: Endoscopy;  Laterality: N/A;  . ESOPHAGOGASTRODUODENOSCOPY (EGD) WITH PROPOFOL  04/25/2017   Procedure: ESOPHAGOGASTRODUODENOSCOPY (EGD) WITH PROPOFOL;  Surgeon: Manya Silvas, MD;  Location: [facility] ENDOSCOPY;  Service: Endoscopy;;  . JOINT REPLACEMENT Left    knee  . KNEE ARTHROSCOPY  1997   left knee  . LEFT HEART CATH AND CORONARY ANGIOGRAPHY Left 11/10/2017   Procedure: LEFT HEART CATH AND CORONARY ANGIOGRAPHY;  Surgeon: Isaias Cowman, MD;  Location: [facility] CV LAB;  Service: Cardiovascular;  Laterality: Left;  . LEFT HEART CATHETERIZATION WITH CORONARY ANGIOGRAM N/A 01/11/2013   Procedure: LEFT HEART CATHETERIZATION WITH CORONARY ANGIOGRAM;  Surgeon: Sinclair Grooms, MD;  Location: [facility] CATH LAB;  Service: Cardiovascular;  Laterality: N/A;  . TOTAL KNEE ARTHROPLASTY  08/30/2011   Procedure: TOTAL KNEE ARTHROPLASTY;  Surgeon: Lorn Junes, MD;  Location: [facility];  Service: Orthopedics;  Laterality: Left;    Family Psychiatric History: I have reviewed family psychiatric history from my progress note on 05/25/2017.  Family History:  Family History  Problem Relation Age of Onset  . Cancer Mother   . Hypertension Father   . Heart disease Father   . Alcohol abuse Father   . Breast cancer Neg Hx     Social History: I have reviewed social history from my progress note on 05/25/2017 Social History   Socioeconomic History  . Marital status: Divorced    Spouse name: Not on file  . Number of children: 2  .  Years of education: 76  . Highest education level: 11th grade  Occupational History    Employer: [employer]  Tobacco Use  . Smoking status: Former Smoker    Packs/day: 2.00    Years: 15.00    Pack years: 30.00    Types: Cigarettes    Quit date: 02/09/1995    Years since quitting: 25.2  . Smokeless tobacco: Never Used  Vaping Use  . Vaping Use: Never used  Substance and Sexual Activity  . Alcohol use: No    Alcohol/week: 0.0 standard drinks  . Drug use: No  . Sexual activity: Not Currently  Other Topics Concern  . Not on file  Social History Narrative   Patient lives at home alone. Patient works at [employer].   Caffeine daily- 2   Right handed.   Education- 11 th grade   Social Determinants of Radio broadcast assistant Strain: Not on file  Food Insecurity: Not on file  Transportation Needs: Not on file  Physical Activity: Not on file  Stress: Not on file  Social Connections: Not on file    Allergies:  Allergies  Allergen Reactions  . Prolixin Decanoate [Fluphenazine]     Ineffective   . Benztropine Other (See Comments)    Hair fall out  Suicide thoughts  . Benztropine Mesylate Other (See Comments)  . Buprenorphine Hcl Other (See Comments)    Unable to void  . Carbidopa-Levodopa Nausea Only  . Codeine Hives  . Cymbalta [Duloxetine Hcl] Other (See Comments)    Altered mental status, Alopecia, visual hallucinations, nightmares  . Gabapentin Swelling  . Lyrica [Pregabalin] Other (See Comments)    Alopecia, visual hallucinations, nightmares Altered mental status  . Morphine And Related Other (See Comments)    Unable to void  . Nortripytline Hcl [Nortriptyline] Other (See Comments)    Hair loss and night mares   . Nsaids     REACTION: palpitations, diaphoresis  . Penicillins Nausea And Vomiting and Other (See Comments)    REACTION: upset stomach Has patient had a PCN reaction causing immediate rash, facial/tongue/throat swelling, SOB or  lightheadedness with hypotension: No Has patient had a PCN reaction causing severe rash involving mucus membranes or skin necrosis: No Has patient had a PCN reaction that required hospitalization: No Has patient had a PCN reaction occurring within the last 10 years: No If all of the above answers are "NO", then may proceed with Cephalosporin use.  . Wellbutrin [Bupropion]     Constipation, mood swings    Metabolic Disorder Labs: Lab Results  Component Value Date   HGBA1C 6.2 (H) 03/06/2017   MPG 131.24 03/06/2017   MPG 108 05/17/2015   Lab Results  Component Value Date   PROLACTIN 64.2 (H) 03/06/2017   Lab Results  Component Value Date   CHOL 146 08/22/2018   TRIG 172 (H) 08/22/2018   HDL 49 08/22/2018   CHOLHDL 3.0 08/22/2018   VLDL 34 08/22/2018   LDLCALC 63 08/22/2018   LDLCALC 59 03/06/2017   Lab Results  Component Value Date   TSH 0.959 08/22/2018   TSH 2.766 08/24/2017    Therapeutic Level Labs: No results found for: LITHIUM No results found for: VALPROATE No components found for:  CBMZ  Current Medications: Current Outpatient Medications  Medication Sig Dispense Refill  . HYDROcodone-acetaminophen (NORCO) 7.5-325 MG tablet 1 tablet as needed    . ACCU-CHEK GUIDE test strip     . acetaminophen (TYLENOL) 500 MG tablet Take 1,000 mg by mouth every 6 (six) hours as needed for moderate pain or headache.     . albuterol (PROVENTIL HFA;VENTOLIN HFA) 108 (90 Base) MCG/ACT inhaler Inhale 2 puffs into the lungs every 6 (six) hours as needed for wheezing or shortness of breath.    . Alcohol Swabs (B-D SINGLE USE SWABS REGULAR) PADS     . amantadine (SYMMETREL) 100 MG capsule Take 1 capsule (100 mg total) by mouth 2 (two) times daily. 60 capsule 1  . aspirin EC 81 MG tablet Take 81 mg by mouth daily. Swallow whole.    . Blood Glucose Calibration (ACCU-CHEK AVIVA) SOLN     . Blood Glucose Monitoring Suppl (ACCU-CHEK AVIVA PLUS) w/Device KIT     . budesonide-formoterol  (SYMBICORT) 160-4.5 MCG/ACT inhaler Inhale into the lungs.    . Cholecalciferol (VITAMIN D-3) 125 MCG (5000 UT) TABS Take 5,000 Units by mouth daily.    . clindamycin (CLEOCIN) 300 MG capsule Take 600 mg by mouth See admin instructions. Take 600 mg 1 hour prior to dental work (Patient not taking: Reported on 04/08/2020)    . Cyanocobalamin (B-12 COMPLIANCE INJECTION IJ) Inject  1 Dose as directed every 30 (thirty) days.    . cyclobenzaprine (FLEXERIL) 5 MG tablet Take 5 mg by mouth 2 (two) times daily. (Patient not taking: Reported on 04/08/2020)    . diphenhydrAMINE (BENADRYL) 25 MG tablet Take 25 mg by mouth daily as needed for allergies.  (Patient not taking: Reported on 04/08/2020)    . docusate sodium (COLACE) 50 MG capsule Take 100 mg by mouth 2 (two) times daily.    Marland Kitchen doxycycline (MONODOX) 100 MG capsule     . FLUCELVAX QUADRIVALENT 0.5 ML injection     . FLUoxetine (PROZAC) 20 MG capsule Take 1 capsule (20 mg total) by mouth daily. Take along with Fluoxetine 40 mg daily-could take with breakfast 90 capsule 0  . FLUoxetine (PROZAC) 40 MG capsule Take 1 capsule (40 mg total) by mouth daily. 90 capsule 0  . furosemide (LASIX) 20 MG tablet     . glimepiride (AMARYL) 2 MG tablet Take 2 mg by mouth 2 (two) times daily.  (Patient not taking: Reported on 04/08/2020)    . HYDROcodone-acetaminophen (NORCO) 7.5-325 MG tablet Take 0.5 tablets by mouth 2 (two) times daily.     . hydroxypropyl methylcellulose / hypromellose (ISOPTO TEARS / GONIOVISC) 2.5 % ophthalmic solution Place 1 drop into both eyes every 4 (four) hours as needed for dry eyes.     . insulin isophane & regular human (NOVOLIN 70/30 FLEXPEN) (70-30) 100 UNIT/ML KwikPen Inject into the skin.    . Insulin Syringe-Needle U-100 (INSULIN SYRINGE 1CC/31GX5/16") 31G X 5/16" 1 ML MISC See admin instructions.    . Lancets Misc. (ACCU-CHEK SOFTCLIX LANCET DEV) KIT Use 1 each 3 (three) times daily Use as instructed.    Marland Kitchen levothyroxine (SYNTHROID) 25 MCG  tablet Take by mouth. (Patient not taking: Reported on 04/08/2020)    . levothyroxine (SYNTHROID) 50 MCG tablet Take by mouth.    . melatonin 5 MG TABS Take 5 mg by mouth.    . metFORMIN (GLUCOPHAGE) 1000 MG tablet Take 1,000 mg by mouth 2 (two) times daily with a meal.    . metFORMIN (GLUCOPHAGE) 500 MG tablet Take 1 tablet (500 mg total) by mouth 2 (two) times daily with a meal. For diabetes management (Patient not taking: Reported on 04/08/2020) 10 tablet 0  . montelukast (SINGULAIR) 10 MG tablet Take 10 mg by mouth at bedtime.     . nitrofurantoin, macrocrystal-monohydrate, (MACROBID) 100 MG capsule Take 100 mg by mouth 2 (two) times daily. (Patient not taking: Reported on 04/08/2020)    . NOVOLIN 70/30 FLEXPEN (70-30) 100 UNIT/ML KwikPen     . OLANZapine (ZYPREXA) 2.5 MG tablet Take 1 tablet (2.5 mg total) by mouth at bedtime. 90 tablet 0  . omeprazole (PRILOSEC) 40 MG capsule Take 40 mg by mouth daily as needed (heartburn).     . ondansetron (ZOFRAN) 4 MG tablet Take 4 mg by mouth every 8 (eight) hours as needed for nausea or vomiting.    . ondansetron (ZOFRAN-ODT) 4 MG disintegrating tablet     . oxybutynin (DITROPAN) 5 MG tablet 1 tab tid prn frequency,urgency, bladder spasm (Patient not taking: Reported on 04/08/2020) 30 tablet 0  . pravastatin (PRAVACHOL) 40 MG tablet Take 40 mg by mouth at bedtime.     . predniSONE (STERAPRED UNI-PAK 21 TAB) 10 MG (21) TBPK tablet Take by mouth as directed. (Patient not taking: Reported on 04/08/2020)    . RELION INSULIN SYRINGE 31G X 15/64" 1 ML MISC     .  rizatriptan (MAXALT) 10 MG tablet Take 10 mg by mouth 2 (two) times daily as needed for migraine. May repeat in 2 hours if needed    . rOPINIRole (REQUIP) 0.25 MG tablet Take by mouth. (Patient not taking: Reported on 04/08/2020)    . rOPINIRole (REQUIP) 0.5 MG tablet Take 0.5 mg by mouth 2 (two) times daily.    Marland Kitchen torsemide (DEMADEX) 20 MG tablet Take 20 mg by mouth every other day.     . traMADol (ULTRAM) 50  MG tablet Take 2 tablets (100 mg total) by mouth 2 (two) times daily for 15 days. (Patient taking differently: Take 50-100 mg by mouth See admin instructions. Take 2 tablets (100 mg) by mouth in the morning, 1 tablet (50 mg) by mouth in the evening, & 1 tablet (50 mg) by mouth at night.) 30 tablet 1  . traZODone (DESYREL) 100 MG tablet Take 0.5-1 tablets (50-100 mg total) by mouth at bedtime as needed for sleep. 30 tablet 1  . verapamil (CALAN-SR) 180 MG CR tablet Take 180 mg by mouth at bedtime.     No current facility-administered medications for this visit.     Musculoskeletal: Strength & Muscle Tone: UTA Gait & Station: UTA Patient leans: N/A  Psychiatric Specialty Exam: Review of Systems  Neurological: Positive for tremors.  Psychiatric/Behavioral: The patient is nervous/anxious.   All other systems reviewed and are negative.   There were no vitals taken for this visit.There is no height or weight on file to calculate BMI.  General Appearance: UTA  Eye Contact:  UTA  Speech:  Clear and Coherent  Volume:  Normal  Mood:  Anxious  Affect:  UTA  Thought Process:  Goal Directed and Descriptions of Associations: Intact  Orientation:  Full (Time, Place, and Person)  Thought Content: Hallucinations: Auditory chronic does not bother her  Suicidal Thoughts:  No  Homicidal Thoughts:  No  Memory:  Immediate;   Fair Recent;   Fair Remote;   Fair  Judgement:  Fair  Insight:  Fair  Psychomotor Activity:  UTA  Concentration:  Concentration: Fair and Attention Span: Fair  Recall:  [employer] of Knowledge: Fair  Language: Fair  Akathisia:  No  Handed:  Right  AIMS (if indicated): UTA  Assets:  Communication Skills Desire for Improvement Housing Social Support  ADL's:  Intact  Cognition: WNL  Sleep:  Fair   Screenings: AIMS   [place] Office Visit from 04/08/2020 in [place] Admission (Discharged) from OP Visit from 08/30/2018 in [facility] Admission (Discharged) from 08/22/2018 in [place] [facility] Office Visit from 12/01/2017 in [place] Office Visit from 09/20/2017 in [place] Total Score 10 0 0 1 0    AUDIT   Flowsheet Row Admission (Discharged) from OP Visit from 08/30/2018 in [place] Admission (Discharged) from 08/22/2018 in [place] [facility] Admission (Discharged) from 03/03/2017 in [place] [facility]  Alcohol Use Disorder Identification Test Final Score (AUDIT) 0 0 0    GAD-7   Flowsheet Row Office Visit from 08/22/2015 in [place] at [facility]  Total GAD-7 Score 6    [place] Visit from 04/08/2020 in [place] Video Visit from 03/26/2020 in [place] Visit from 03/23/2016 in [place] Office Visit from 08/22/2015 in [place] [facility] [place] Clinical Support from 08/06/2015 in [place]  PHQ-2 Total Score 0 0 0 1 0    Flowsheet Row Office Visit from 04/08/2020 in [place] Video Visit from 03/26/2020 in [place] Admission (Discharged) from OP Visit from 08/30/2018 in [place] No Risk No Risk No Risk       Assessment and Plan: Heather Haley is a 60 year old Caucasian female who has a history of schizoaffective disorder, PTSD, migraine headaches, drug-induced Parkinson's disease was evaluated by telemedicine today.  Patient is currently struggling with anxiety symptoms.  She also reports tremors likely drug-induced however also exacerbated by her anxiety.  She will benefit from following plan.  Plan Schizoaffective disorder-stable Continue  Zyprexa 2.5 mg p.o. nightly  GAD-unstable Fluoxetine 60 mg p.o. daily We will refer for CBT.    PTSD-stable We will monitor closely  Insomnia-improving Melatonin 3 to 5 mg p.o. nightly as needed  Drug-induced Parkinson's disease-unstable Patient continues to have tremors exacerbated by recent changes in medication. Will increase amantadine to 100 mg p.o. twice daily Since this was a telephone appointment she could not be evaluated for tremors.  Patient advised to come into the office for an in person visit for evaluation. Further medication changes can be made as needed. Patient is aware about the interaction between amantadine on her other medications including Requip.  We will continue to make medication readjustment as needed  Follow-up in clinic in person in 3 to 4 weeks or sooner if needed.   I have spent at least 20 minutes non face to face with patient today which includes the time spent for preparing to see the patient  (e.g., review of test, records), ordering medications and test, psychoeducation and supportive psychotherapy, care coordination as well as documenting clinical information in electronic health record.   This note was generated in part or whole with voice recognition software. Voice recognition is usually quite accurate but there are transcription errors that can and very often do occur. I apologize for any typographical errors that were not detected and corrected.      Ursula Alert, MD 05/15/2020, 5:48 PM

## 2020-05-16 ENCOUNTER — Other Ambulatory Visit: Payer: Self-pay | Admitting: Psychiatry

## 2020-05-16 DIAGNOSIS — F4310 Post-traumatic stress disorder, unspecified: Secondary | ICD-10-CM

## 2020-05-20 ENCOUNTER — Telehealth: Payer: Medicare HMO | Admitting: Psychiatry

## 2020-06-02 DIAGNOSIS — G2119 Other drug induced secondary parkinsonism: Secondary | ICD-10-CM | POA: Diagnosis not present

## 2020-06-02 DIAGNOSIS — G43719 Chronic migraine without aura, intractable, without status migrainosus: Secondary | ICD-10-CM | POA: Diagnosis not present

## 2020-06-05 DIAGNOSIS — E538 Deficiency of other specified B group vitamins: Secondary | ICD-10-CM | POA: Diagnosis not present

## 2020-06-13 ENCOUNTER — Encounter: Payer: Self-pay | Admitting: Psychiatry

## 2020-06-13 ENCOUNTER — Ambulatory Visit (INDEPENDENT_AMBULATORY_CARE_PROVIDER_SITE_OTHER): Payer: Medicare HMO | Admitting: Psychiatry

## 2020-06-13 ENCOUNTER — Other Ambulatory Visit: Payer: Self-pay

## 2020-06-13 ENCOUNTER — Other Ambulatory Visit
Admission: RE | Admit: 2020-06-13 | Discharge: 2020-06-13 | Disposition: A | Discharge status: Home / Self Care | Payer: Medicare HMO | Attending: Psychiatry | Admitting: Psychiatry

## 2020-06-13 VITALS — BP 125/79 | HR 87 | Temp 98.1°F | Wt 249.0 lb

## 2020-06-13 DIAGNOSIS — F411 Generalized anxiety disorder: Secondary | ICD-10-CM | POA: Diagnosis not present

## 2020-06-13 DIAGNOSIS — G4701 Insomnia due to medical condition: Secondary | ICD-10-CM | POA: Diagnosis not present

## 2020-06-13 DIAGNOSIS — G2119 Other drug induced secondary parkinsonism: Secondary | ICD-10-CM | POA: Diagnosis not present

## 2020-06-13 DIAGNOSIS — F251 Schizoaffective disorder, depressive type: Secondary | ICD-10-CM | POA: Diagnosis not present

## 2020-06-13 DIAGNOSIS — F259 Schizoaffective disorder, unspecified: Secondary | ICD-10-CM | POA: Diagnosis not present

## 2020-06-13 DIAGNOSIS — F4310 Post-traumatic stress disorder, unspecified: Secondary | ICD-10-CM | POA: Diagnosis not present

## 2020-06-13 DIAGNOSIS — Z79899 Other long term (current) drug therapy: Secondary | ICD-10-CM

## 2020-06-13 MED ORDER — FLUOXETINE HCL 20 MG PO CAPS: 20.0000 mg | ORAL_CAPSULE | Freq: Every day | ORAL | 0 refills | Status: DC

## 2020-06-13 MED ORDER — AMANTADINE HCL 100 MG PO CAPS
100.0000 mg | ORAL_CAPSULE | Freq: Two times a day (BID) | ORAL | 0 refills | Status: DC
Start: 2020-06-13 — End: 2020-06-25

## 2020-06-13 MED ORDER — FLUOXETINE HCL 40 MG PO CAPS: 40.0000 mg | ORAL_CAPSULE | Freq: Every day | ORAL | 0 refills | Status: DC

## 2020-06-13 MED ORDER — OLANZAPINE 2.5 MG PO TABS: 2.5000 mg | ORAL_TABLET | Freq: Every day | ORAL | 0 refills | Status: DC

## 2020-06-13 NOTE — Progress Notes (Signed)
[place] MD OP Progress Note  06/13/2020 1:18 PM Heather Haley  MRN:  [id number]  Chief Complaint:  Chief Complaint    Follow-up; Depression; Hallucinations; Anxiety     HPI: Heather Haley is a 60 year old Caucasian female, lives in [place], has a history of schizoaffective disorder, PTSD, GAD, drug-induced Parkinson's disease, headache, memory problems, diabetes melitis, chronic pain was evaluated in office today.  Patient today reports she is currently compliant on her medication.  She reports mood wise she is doing well.  Denies any significant depression.  She reports she pushes herself to get out of the house on a regular basis.  She has not started exercising however is interested in going to the [facility] if possible.  She agrees to look into that.  She reports sleep is overall okay.  She denies any suicidality or homicidality.  She continues to have hallucinations however they are chronic and it does not bother her much.  Patient reports she she has a headache and is currently under the care of neurology.  She was prescribed Aimovig although she could not afford it.  Patient reports she was also started on carbidopa/levodopa for her tremors and since being on that she has noticed improvement of her tremors.  Patient denies any other concerns today. Visit Diagnosis:    ICD-10-CM   1. Schizoaffective disorder, depressive type ([place])  F25.1 FLUoxetine (PROZAC) 40 MG capsule    OLANZapine (ZYPREXA) 2.5 MG tablet  2. PTSD (post-traumatic stress disorder)  F43.10 FLUoxetine (PROZAC) 40 MG capsule    FLUoxetine (PROZAC) 20 MG capsule  3. Generalized anxiety disorder  F41.1 FLUoxetine (PROZAC) 40 MG capsule    FLUoxetine (PROZAC) 20 MG capsule  4. Insomnia due to medical condition  G47.01   5. Drug-induced Parkinson's disease ([place])  G21.19 amantadine (SYMMETREL) 100 MG capsule  6. High risk medication use  Z79.899 Prolactin    Past Psychiatric History: I have reviewed past  psychiatric history from progress note on 05/25/2017.  Past Medical History:  Past Medical History:  Diagnosis Date  . Anxiety   . Asthma   . Chronic kidney disease   . Chronic pain    previously saw Dr. Consuela Mimes in pain clinic, then saw pain specialist in [place]  . Depression   . Diabetes mellitus ([place])   . Frequency of urination   . GERD (gastroesophageal reflux disease)   . Headache(784.0)   . High cholesterol   . History of kidney stones   . Hypertension   . IBS (irritable bowel syndrome)   . Left ankle instability   . Left knee DJD   . Lumbar Degenerative Disc Disease of  10/11/2014  . Neuromuscular disorder ([place])   . Osteoarthritis of hip (Right) 05/05/2015  . Other enthesopathy of ankle and tarsus 12/15/2009   Qualifier: Diagnosis of  By: Oneida Alar MD, KARL    . Parkinson's disease ([place])   . Peripheral sensory neuropathy (Bilateral) 11/19/2014  . Postoperative nausea and vomiting   . Schizophrenia [facility])     Past Surgical History:  Procedure Laterality Date  . ABDOMINAL HYSTERECTOMY    . ANKLE SURGERY Left    x 2  . APPENDECTOMY    . CARDIAC CATHETERIZATION    . COLONOSCOPY  2013  . COLONOSCOPY WITH PROPOFOL N/A 04/25/2017   Procedure: COLONOSCOPY WITH PROPOFOL;  Surgeon: Manya Silvas, MD;  Location: [facility] ENDOSCOPY;  Service: Endoscopy;  Laterality: N/A;  . CYSTOSCOPY/URETEROSCOPY/HOLMIUM LASER/STENT PLACEMENT Left 09/11/2019   Procedure: CYSTOSCOPY/URETEROSCOPY/HOLMIUM LASER/STENT PLACEMENT;  Surgeon: Abbie Sons, MD;  Location: [facility] ORS;  Service: Urology;  Laterality: Left;  . ESOPHAGOGASTRODUODENOSCOPY (EGD) WITH PROPOFOL N/A 10/03/2014   Procedure: ESOPHAGOGASTRODUODENOSCOPY (EGD) WITH PROPOFOL;  Surgeon: Josefine Class, MD;  Location: [facility] ENDOSCOPY;  Service: Endoscopy;  Laterality: N/A;  . ESOPHAGOGASTRODUODENOSCOPY (EGD) WITH PROPOFOL  04/25/2017   Procedure: ESOPHAGOGASTRODUODENOSCOPY (EGD) WITH PROPOFOL;  Surgeon: Manya Silvas, MD;  Location:  [facility] ENDOSCOPY;  Service: Endoscopy;;  . JOINT REPLACEMENT Left    knee  . KNEE ARTHROSCOPY  1997   left knee  . LEFT HEART CATH AND CORONARY ANGIOGRAPHY Left 11/10/2017   Procedure: LEFT HEART CATH AND CORONARY ANGIOGRAPHY;  Surgeon: Isaias Cowman, MD;  Location: [facility] CV LAB;  Service: Cardiovascular;  Laterality: Left;  . LEFT HEART CATHETERIZATION WITH CORONARY ANGIOGRAM N/A 01/11/2013   Procedure: LEFT HEART CATHETERIZATION WITH CORONARY ANGIOGRAM;  Surgeon: Sinclair Grooms, MD;  Location: [facility] CATH LAB;  Service: Cardiovascular;  Laterality: N/A;  . TOTAL KNEE ARTHROPLASTY  08/30/2011   Procedure: TOTAL KNEE ARTHROPLASTY;  Surgeon: Lorn Junes, MD;  Location: [facility];  Service: Orthopedics;  Laterality: Left;    Family Psychiatric History: I have reviewed family psychiatric history from progress note on 05/25/2017.  Family History:  Family History  Problem Relation Age of Onset  . Cancer Mother   . Hypertension Father   . Heart disease Father   . Alcohol abuse Father   . Breast cancer Neg Hx     Social History: I have reviewed social history from progress note on 05/25/2017. Social History   Socioeconomic History  . Marital status: Divorced    Spouse name: Not on file  . Number of children: 2  . Years of education: 70  . Highest education level: 11th grade  Occupational History    Employer: [employer]  Tobacco Use  . Smoking status: Former Smoker    Packs/day: 2.00    Years: 15.00    Pack years: 30.00    Types: Cigarettes    Quit date: 02/09/1995    Years since quitting: 25.3  . Smokeless tobacco: Never Used  Vaping Use  . Vaping Use: Never used  Substance and Sexual Activity  . Alcohol use: No    Alcohol/week: 0.0 standard drinks  . Drug use: No  . Sexual activity: Not Currently  Other Topics Concern  . Not on file  Social History Narrative   Patient lives at home alone. Patient works at [employer].   Caffeine daily- 2   Right handed.    Education- 11 th grade   Social Determinants of Radio broadcast assistant Strain: Not on file  Food Insecurity: Not on file  Transportation Needs: Not on file  Physical Activity: Not on file  Stress: Not on file  Social Connections: Not on file    Allergies:  Allergies  Allergen Reactions  . Prolixin Decanoate [Fluphenazine]     Ineffective   . Benztropine Other (See Comments)    Hair fall out  Suicide thoughts  . Benztropine Mesylate Other (See Comments)  . Buprenorphine Hcl Other (See Comments)    Unable to void  . Carbidopa-Levodopa Nausea Only  . Codeine Hives  . Cymbalta [Duloxetine Hcl] Other (See Comments)    Altered mental status, Alopecia, visual hallucinations, nightmares  . Gabapentin Swelling  . Lyrica [Pregabalin] Other (See Comments)    Alopecia, visual hallucinations, nightmares Altered mental status  . Morphine And Related Other (See Comments)  Unable to void  . Nortripytline Hcl [Nortriptyline] Other (See Comments)    Hair loss and night mares   . Nsaids     REACTION: palpitations, diaphoresis  . Penicillins Nausea And Vomiting and Other (See Comments)    REACTION: upset stomach Has patient had a PCN reaction causing immediate rash, facial/tongue/throat swelling, SOB or lightheadedness with hypotension: No Has patient had a PCN reaction causing severe rash involving mucus membranes or skin necrosis: No Has patient had a PCN reaction that required hospitalization: No Has patient had a PCN reaction occurring within the last 10 years: No If all of the above answers are "NO", then may proceed with Cephalosporin use.  . Wellbutrin [Bupropion]     Constipation, mood swings    Metabolic Disorder Labs: Lab Results  Component Value Date   HGBA1C 6.2 (H) 03/06/2017   MPG 131.24 03/06/2017   MPG 108 05/17/2015   Lab Results  Component Value Date   PROLACTIN 64.2 (H) 03/06/2017   Lab Results  Component Value Date   CHOL 146 08/22/2018   TRIG  172 (H) 08/22/2018   HDL 49 08/22/2018   CHOLHDL 3.0 08/22/2018   VLDL 34 08/22/2018   LDLCALC 63 08/22/2018   LDLCALC 59 03/06/2017   Lab Results  Component Value Date   TSH 0.959 08/22/2018   TSH 2.766 08/24/2017    Therapeutic Level Labs: No results found for: LITHIUM No results found for: VALPROATE No components found for:  CBMZ  Current Medications: Current Outpatient Medications  Medication Sig Dispense Refill  . ACCU-CHEK GUIDE test strip     . acetaminophen (TYLENOL) 500 MG tablet Take 1,000 mg by mouth every 6 (six) hours as needed for moderate pain or headache.     . albuterol (PROVENTIL HFA;VENTOLIN HFA) 108 (90 Base) MCG/ACT inhaler Inhale 2 puffs into the lungs every 6 (six) hours as needed for wheezing or shortness of breath.    . Alcohol Swabs (B-D SINGLE USE SWABS REGULAR) PADS     . aspirin EC 81 MG tablet Take 81 mg by mouth daily. Swallow whole.    . Blood Glucose Calibration (ACCU-CHEK AVIVA) SOLN     . Blood Glucose Monitoring Suppl (ACCU-CHEK AVIVA PLUS) w/Device KIT     . budesonide-formoterol (SYMBICORT) 160-4.5 MCG/ACT inhaler Inhale into the lungs.    . carbidopa-levodopa (SINEMET IR) 25-100 MG tablet Take by mouth.    . Cholecalciferol (VITAMIN D-3) 125 MCG (5000 UT) TABS Take 5,000 Units by mouth daily.    . clindamycin (CLEOCIN) 300 MG capsule Take 600 mg by mouth See admin instructions. Take 600 mg 1 hour prior to dental work    . Cyanocobalamin (B-12 COMPLIANCE INJECTION IJ) Inject 1 Dose as directed every 30 (thirty) days.    . cyclobenzaprine (FLEXERIL) 5 MG tablet Take 5 mg by mouth 2 (two) times daily.    . diphenhydrAMINE (BENADRYL) 25 MG tablet Take 25 mg by mouth daily as needed for allergies.    Marland Kitchen docusate sodium (COLACE) 50 MG capsule Take 100 mg by mouth 2 (two) times daily.    Marland Kitchen doxycycline (MONODOX) 100 MG capsule     . Erenumab-aooe 140 MG/ML SOAJ Inject into the skin.    Marland Kitchen FLUCELVAX QUADRIVALENT 0.5 ML injection     . furosemide  (LASIX) 20 MG tablet     . glimepiride (AMARYL) 2 MG tablet Take 2 mg by mouth 2 (two) times daily.    Marland Kitchen HYDROcodone-acetaminophen (NORCO) 7.5-325 MG tablet 1 tablet  as needed    . hydroxypropyl methylcellulose / hypromellose (ISOPTO TEARS / GONIOVISC) 2.5 % ophthalmic solution Place 1 drop into both eyes every 4 (four) hours as needed for dry eyes.     . insulin isophane & regular human (NOVOLIN 70/30 FLEXPEN) (70-30) 100 UNIT/ML KwikPen Inject into the skin.    . Insulin Syringe-Needle U-100 (INSULIN SYRINGE 1CC/31GX5/16") 31G X 5/16" 1 ML MISC See admin instructions.    . Lancets Misc. (ACCU-CHEK SOFTCLIX LANCET DEV) KIT Use 1 each 3 (three) times daily Use as instructed.    Marland Kitchen levothyroxine (SYNTHROID) 25 MCG tablet Take by mouth.    . levothyroxine (SYNTHROID) 50 MCG tablet Take by mouth.    . melatonin 5 MG TABS Take 5 mg by mouth.    . metFORMIN (GLUCOPHAGE) 1000 MG tablet Take 1,000 mg by mouth 2 (two) times daily with a meal.    . montelukast (SINGULAIR) 10 MG tablet Take 10 mg by mouth at bedtime.     . nitrofurantoin, macrocrystal-monohydrate, (MACROBID) 100 MG capsule Take 100 mg by mouth 2 (two) times daily.    Marland Kitchen NOVOLIN 70/30 FLEXPEN (70-30) 100 UNIT/ML KwikPen     . omeprazole (PRILOSEC) 40 MG capsule Take 40 mg by mouth daily as needed (heartburn).     . ondansetron (ZOFRAN) 4 MG tablet Take 4 mg by mouth every 8 (eight) hours as needed for nausea or vomiting.    . ondansetron (ZOFRAN-ODT) 4 MG disintegrating tablet     . oxybutynin (DITROPAN) 5 MG tablet 1 tab tid prn frequency,urgency, bladder spasm 30 tablet 0  . pravastatin (PRAVACHOL) 40 MG tablet Take 40 mg by mouth at bedtime.     . predniSONE (STERAPRED UNI-PAK 21 TAB) 10 MG (21) TBPK tablet Take by mouth as directed.    Marland Kitchen RELION INSULIN SYRINGE 31G X 15/64" 1 ML MISC     . rizatriptan (MAXALT) 10 MG tablet Take 10 mg by mouth 2 (two) times daily as needed for migraine. May repeat in 2 hours if needed    . rOPINIRole  (REQUIP) 0.25 MG tablet Take by mouth.    . torsemide (DEMADEX) 20 MG tablet Take 20 mg by mouth every other day.     . traMADol (ULTRAM) 50 MG tablet Take 2 tablets (100 mg total) by mouth 2 (two) times daily for 15 days. (Patient taking differently: Take 50-100 mg by mouth See admin instructions. Take 2 tablets (100 mg) by mouth in the morning, 1 tablet (50 mg) by mouth in the evening, & 1 tablet (50 mg) by mouth at night.) 30 tablet 1  . traZODone (DESYREL) 100 MG tablet TAKE 0.5-1 TABLETS (50-100 MG TOTAL) BY MOUTH AT BEDTIME AS NEEDED FOR SLEEP. 90 tablet 1  . verapamil (CALAN-SR) 180 MG CR tablet Take 180 mg by mouth at bedtime.    Marland Kitchen amantadine (SYMMETREL) 100 MG capsule Take 1 capsule (100 mg total) by mouth 2 (two) times daily. 180 capsule 0  . FLUoxetine (PROZAC) 20 MG capsule Take 1 capsule (20 mg total) by mouth daily. Take along with Fluoxetine 40 mg daily-could take with breakfast 90 capsule 0  . FLUoxetine (PROZAC) 40 MG capsule Take 1 capsule (40 mg total) by mouth daily. 90 capsule 0  . OLANZapine (ZYPREXA) 2.5 MG tablet Take 1 tablet (2.5 mg total) by mouth at bedtime. 90 tablet 0   No current facility-administered medications for this visit.     Musculoskeletal: Strength & Muscle Tone: UTA Gait & Station:  Walks with cane, wears ankle brace - left side Patient leans: N/A  Psychiatric Specialty Exam: Review of Systems  Neurological: Positive for tremors and headaches.  Psychiatric/Behavioral: Positive for hallucinations.  All other systems reviewed and are negative.   Blood pressure 125/79, pulse 87, temperature 98.1 F (36.7 C), temperature source Oral, weight 249 lb (112.9 kg).Body mass index is 41.44 kg/m.  General Appearance: Casual  Eye Contact:  Fair  Speech:  Clear and Coherent  Volume:  Normal  Mood:  Euthymic  Affect:  Congruent  Thought Process:  Goal Directed and Descriptions of Associations: Intact  Orientation:  Full (Time, Place, and Person)  Thought  Content: Hallucinations: Auditory   Suicidal Thoughts:  No  Homicidal Thoughts:  No  Memory:  Immediate;   Fair Recent;   Fair Remote;   Fair  Judgement:  Fair  Insight:  Fair  Psychomotor Activity:  Tremor BL UE, LE - improving  Concentration:  Concentration: Fair and Attention Span: Fair  Recall:  [employer] of Knowledge: Fair  Language: Fair  Akathisia:  No  Handed:  Right  AIMS (if indicated): done  Assets:  Communication Skills Desire for Improvement Housing Social Support  ADL's:  Intact  Cognition: WNL  Sleep:  Fair   Screenings: [place] Office Visit from 06/13/2020 in [facility] Visit from 04/08/2020 in [facility] Admission (Discharged) from [place] from 08/30/2018 in [place] Admission (Discharged) from 08/22/2018 in [facility] Office Visit from 12/01/2017 in [facility] Total Score 4 10 0 0 1    AUDIT   Flowsheet Row Admission (Discharged) from OP Visit from 08/30/2018 in [place] Admission (Discharged) from 08/22/2018 in [facility] Admission (Discharged) from 03/03/2017 in [facility]  Alcohol Use Disorder Identification Test Final Score (AUDIT) 0 0 0    GAD-7   Flowsheet Row Office Visit from 08/22/2015 in [facility] at [facility]  Total GAD-7 Score 6    PHQ2-9   [place] Visit from 06/13/2020 in [facility] from 04/08/2020 in [facility] Video Visit from 03/26/2020 in [place] Visit from 03/23/2016 in [place] Office Visit from 08/22/2015 in [facility] at [place]  PHQ-2 Total Score 1 0 0 0 1    [place] Visit from 06/13/2020 in [facility] from 04/08/2020 in [facility] [place] Video Visit from 03/26/2020 in [place] Error: Question 6 not populated No Risk No Risk       Assessment and Plan: ZEINAB RODWELL is a 60 year old Caucasian female who has a history of schizoaffective disorder, PTSD, migraine headaches, drug-induced Parkinson's disease was evaluated in office today.  Patient is currently stable on the current medication regimen.  Plan as noted below.  Plan Schizoaffective disorder-stable Zyprexa 2.5 mg p.o. nightly  GAD- stable Fluoxetine 60 mg p.o. daily However will refer for CBT.  Patient to be scheduled with Ms. Christina Hussami  PTSD-stable Will monitor closely  Insomnia-improving Melatonin 3 to 5 mg p.o. nightly as needed  Drug-induced Parkinson's disease-improving Continue follow-up with neurology Continue amantadine 100 mg p.o. twice daily She is also on carbidopa levodopa as well as Requip.  High risk medication use-will order prolactin level.  She will go to [facility].  Follow-up in clinic in 2 to 3 months or sooner if needed.  This note was generated in part or whole with voice recognition software. Voice recognition is usually quite accurate but there are transcription errors that can and very often do occur. I apologize for any typographical errors that were not detected and corrected.      Ursula Alert, MD 06/13/2020, 1:18 PM

## 2020-06-14 LAB — PROLACTIN: Prolactin: 4.5 ng/mL — ABNORMAL LOW (ref 4.8–23.3)

## 2020-06-24 ENCOUNTER — Other Ambulatory Visit: Payer: Self-pay | Admitting: Psychiatry

## 2020-06-24 DIAGNOSIS — F251 Schizoaffective disorder, depressive type: Secondary | ICD-10-CM

## 2020-06-25 ENCOUNTER — Telehealth: Payer: Self-pay

## 2020-06-25 DIAGNOSIS — G2119 Other drug induced secondary parkinsonism: Secondary | ICD-10-CM

## 2020-06-25 MED ORDER — AMANTADINE HCL 100 MG PO CAPS: 100.0000 mg | ORAL_CAPSULE | Freq: Two times a day (BID) | ORAL | 0 refills | Status: DC

## 2020-06-25 NOTE — Telephone Encounter (Signed)
I have sent amantadine to pharmacy at [facility].

## 2020-06-25 NOTE — Telephone Encounter (Signed)
pt called asked if the amantadine 100mg  could be sent to [place]. she states at [facility] it will cost her over a $100. and she also wanted to know about her labwork results.

## 2020-06-26 DIAGNOSIS — E039 Hypothyroidism, unspecified: Secondary | ICD-10-CM | POA: Diagnosis not present

## 2020-06-26 DIAGNOSIS — J4540 Moderate persistent asthma, uncomplicated: Secondary | ICD-10-CM | POA: Diagnosis not present

## 2020-06-26 DIAGNOSIS — I10 Essential (primary) hypertension: Secondary | ICD-10-CM | POA: Diagnosis not present

## 2020-06-26 DIAGNOSIS — E1165 Type 2 diabetes mellitus with hyperglycemia: Secondary | ICD-10-CM | POA: Diagnosis not present

## 2020-06-26 DIAGNOSIS — Z6841 Body Mass Index (BMI) 40.0 and over, adult: Secondary | ICD-10-CM | POA: Diagnosis not present

## 2020-06-26 DIAGNOSIS — F209 Schizophrenia, unspecified: Secondary | ICD-10-CM | POA: Diagnosis not present

## 2020-06-26 DIAGNOSIS — R0600 Dyspnea, unspecified: Secondary | ICD-10-CM | POA: Diagnosis not present

## 2020-06-26 DIAGNOSIS — R221 Localized swelling, mass and lump, neck: Secondary | ICD-10-CM | POA: Diagnosis not present

## 2020-07-03 ENCOUNTER — Other Ambulatory Visit: Payer: Self-pay | Admitting: Internal Medicine

## 2020-07-03 DIAGNOSIS — J4530 Mild persistent asthma, uncomplicated: Secondary | ICD-10-CM | POA: Diagnosis not present

## 2020-07-03 DIAGNOSIS — E1165 Type 2 diabetes mellitus with hyperglycemia: Secondary | ICD-10-CM | POA: Diagnosis not present

## 2020-07-03 DIAGNOSIS — R519 Headache, unspecified: Secondary | ICD-10-CM | POA: Diagnosis not present

## 2020-07-03 DIAGNOSIS — M170 Bilateral primary osteoarthritis of knee: Secondary | ICD-10-CM | POA: Diagnosis not present

## 2020-07-03 DIAGNOSIS — Z1231 Encounter for screening mammogram for malignant neoplasm of breast: Secondary | ICD-10-CM | POA: Diagnosis not present

## 2020-07-03 DIAGNOSIS — F209 Schizophrenia, unspecified: Secondary | ICD-10-CM | POA: Diagnosis not present

## 2020-07-03 DIAGNOSIS — Z794 Long term (current) use of insulin: Secondary | ICD-10-CM | POA: Diagnosis not present

## 2020-07-03 DIAGNOSIS — M545 Low back pain, unspecified: Secondary | ICD-10-CM | POA: Diagnosis not present

## 2020-07-03 DIAGNOSIS — Z0000 Encounter for general adult medical examination without abnormal findings: Secondary | ICD-10-CM | POA: Diagnosis not present

## 2020-07-08 DIAGNOSIS — E538 Deficiency of other specified B group vitamins: Secondary | ICD-10-CM | POA: Diagnosis not present

## 2020-07-09 ENCOUNTER — Ambulatory Visit: Payer: Medicare HMO | Admitting: Licensed Clinical Social Worker

## 2020-07-09 ENCOUNTER — Telehealth: Payer: Self-pay

## 2020-07-09 DIAGNOSIS — F4310 Post-traumatic stress disorder, unspecified: Secondary | ICD-10-CM

## 2020-07-09 NOTE — Telephone Encounter (Signed)
received a fax for refills of trazodone

## 2020-07-09 NOTE — Telephone Encounter (Signed)
Was sent out in April for 3 months with 1 refill to [facility] .

## 2020-07-10 MED ORDER — TRAZODONE HCL 100 MG PO TABS: 50.0000 mg | ORAL_TABLET | Freq: Every evening | ORAL | 1 refills | Status: DC | PRN

## 2020-07-10 NOTE — Telephone Encounter (Signed)
I have sent trazodone to [facility] pharmacy-contacted patient to discuss.

## 2020-07-10 NOTE — Telephone Encounter (Signed)
pt called left message that she needs her medication sent to [place]

## 2020-07-11 ENCOUNTER — Ambulatory Visit
Admission: RE | Admit: 2020-07-11 | Discharge: 2020-07-11 | Disposition: A | Discharge status: Home / Self Care | Payer: Medicare HMO | Source: Ambulatory Visit | Attending: Internal Medicine | Admitting: Internal Medicine

## 2020-07-11 ENCOUNTER — Other Ambulatory Visit: Payer: Self-pay

## 2020-07-11 DIAGNOSIS — Z1231 Encounter for screening mammogram for malignant neoplasm of breast: Secondary | ICD-10-CM | POA: Diagnosis not present

## 2020-07-20 ENCOUNTER — Other Ambulatory Visit: Payer: Self-pay | Admitting: Psychiatry

## 2020-07-20 DIAGNOSIS — G2119 Other drug induced secondary parkinsonism: Secondary | ICD-10-CM

## 2020-08-05 DIAGNOSIS — G894 Chronic pain syndrome: Secondary | ICD-10-CM | POA: Diagnosis not present

## 2020-08-05 DIAGNOSIS — M79605 Pain in left leg: Secondary | ICD-10-CM | POA: Diagnosis not present

## 2020-08-05 DIAGNOSIS — Z79891 Long term (current) use of opiate analgesic: Secondary | ICD-10-CM | POA: Diagnosis not present

## 2020-08-08 DIAGNOSIS — E538 Deficiency of other specified B group vitamins: Secondary | ICD-10-CM | POA: Diagnosis not present

## 2020-08-19 ENCOUNTER — Encounter: Payer: Self-pay | Admitting: Psychiatry

## 2020-08-19 ENCOUNTER — Other Ambulatory Visit: Payer: Self-pay

## 2020-08-19 ENCOUNTER — Ambulatory Visit (INDEPENDENT_AMBULATORY_CARE_PROVIDER_SITE_OTHER): Payer: Medicare HMO | Admitting: Psychiatry

## 2020-08-19 VITALS — BP 112/74 | HR 94 | Temp 98.9°F | Wt 245.8 lb

## 2020-08-19 DIAGNOSIS — G4701 Insomnia due to medical condition: Secondary | ICD-10-CM

## 2020-08-19 DIAGNOSIS — F411 Generalized anxiety disorder: Secondary | ICD-10-CM | POA: Diagnosis not present

## 2020-08-19 DIAGNOSIS — G4700 Insomnia, unspecified: Secondary | ICD-10-CM

## 2020-08-19 DIAGNOSIS — Z79899 Other long term (current) drug therapy: Secondary | ICD-10-CM

## 2020-08-19 DIAGNOSIS — G2119 Other drug induced secondary parkinsonism: Secondary | ICD-10-CM

## 2020-08-19 DIAGNOSIS — F251 Schizoaffective disorder, depressive type: Secondary | ICD-10-CM

## 2020-08-19 DIAGNOSIS — F4310 Post-traumatic stress disorder, unspecified: Secondary | ICD-10-CM

## 2020-08-19 MED ORDER — FLUOXETINE HCL 40 MG PO CAPS: 80.0000 mg | ORAL_CAPSULE | Freq: Every day | ORAL | 0 refills | Status: DC

## 2020-08-19 NOTE — Progress Notes (Signed)
[place] MD OP Progress Note  08/19/2020 2:59 PM Heather Haley  MRN:  [id number]  Chief Complaint:  Chief Complaint   Follow-up    HPI: Heather Haley is a 60 year old Caucasian female, lives in [place], has a history of schizoaffective disorder, PTSD, GAD, drug induced Parkinson's disease, headache, memory problems, diabetes mellitus, chronic pain was evaluated in office today.  Patient today reports she is currently struggling with anxiety symptoms, feels nervous often.  She also reports tremors may have worsened.  She continues to be under the care of neurology and is on carbidopa/levodopa.  She is also on fluoxetine for her anxiety.  She is interested in a dose increase.  Denies side effects to the fluoxetine.  She does report hallucinations, auditory, conversations.  She reports she is currently on the olanzapine 2.5 mg.  Dose was reduced due to her tremors as well as other adverse side effects like weight gain.  She reports she is able to cope with her hallucinations at this time however will monitor it closely and let writer know.  Patient denies any suicidality, homicidality or perceptual disturbances.  She currently reports good social support system from her daughter.  She spends time with her grandchildren.  She continues to have a headache and reports she continues to follow-up with her neurologist for the same.  Patient denies any other concerns today.  Visit Diagnosis:    ICD-10-CM   1. Schizoaffective disorder, depressive type ([place])  F25.1 FLUoxetine (PROZAC) 40 MG capsule    2. PTSD (post-traumatic stress disorder)  F43.10 FLUoxetine (PROZAC) 40 MG capsule    3. Generalized anxiety disorder  F41.1 FLUoxetine (PROZAC) 40 MG capsule    4. Drug-induced Parkinson's disease ([place])  G21.19     5. Insomnia due to medical condition  G47.01    mood, headache    6. High risk medication use  Z79.899       Past Psychiatric History: I have reviewed past psychiatric  history from progress note on 05/25/2017  Past Medical History:  Past Medical History:  Diagnosis Date   Anxiety    Asthma    Chronic kidney disease    Chronic pain    previously saw Dr. Consuela Mimes in pain clinic, then saw pain specialist in [facility]   Depression    Diabetes mellitus ([place])    Frequency of urination    GERD (gastroesophageal reflux disease)    Headache(784.0)    High cholesterol    History of kidney stones    Hypertension    IBS (irritable bowel syndrome)    Left ankle instability    Left knee DJD    Lumbar Degenerative Disc Disease of  10/11/2014   Neuromuscular disorder ([place])    Osteoarthritis of hip (Right) 05/05/2015   Other enthesopathy of ankle and tarsus 12/15/2009   Qualifier: Diagnosis of  By: Oneida Alar MD, KARL     Parkinson's disease ([place])    Peripheral sensory neuropathy (Bilateral) 11/19/2014   Postoperative nausea and vomiting    Schizophrenia ([place])     Past Surgical History:  Procedure Laterality Date   ABDOMINAL HYSTERECTOMY     ANKLE SURGERY Left    x 2   APPENDECTOMY     CARDIAC CATHETERIZATION     COLONOSCOPY  2013   COLONOSCOPY WITH PROPOFOL N/A 04/25/2017   Procedure: COLONOSCOPY WITH PROPOFOL;  Surgeon: Manya Silvas, MD;  Location: [facility] ENDOSCOPY;  Service: Endoscopy;  Laterality: N/A;   CYSTOSCOPY/URETEROSCOPY/HOLMIUM LASER/STENT PLACEMENT Left 09/11/2019  Procedure: CYSTOSCOPY/URETEROSCOPY/HOLMIUM LASER/STENT PLACEMENT;  Surgeon: Abbie Sons, MD;  Location: [facility] ORS;  Service: Urology;  Laterality: Left;   ESOPHAGOGASTRODUODENOSCOPY (EGD) WITH PROPOFOL N/A 10/03/2014   Procedure: ESOPHAGOGASTRODUODENOSCOPY (EGD) WITH PROPOFOL;  Surgeon: Josefine Class, MD;  Location: [facility] ENDOSCOPY;  Service: Endoscopy;  Laterality: N/A;   ESOPHAGOGASTRODUODENOSCOPY (EGD) WITH PROPOFOL  04/25/2017   Procedure: ESOPHAGOGASTRODUODENOSCOPY (EGD) WITH PROPOFOL;  Surgeon: Manya Silvas, MD;  Location: [facility] ENDOSCOPY;  Service: Endoscopy;;   JOINT  REPLACEMENT Left    knee   KNEE ARTHROSCOPY  1997   left knee   LEFT HEART CATH AND CORONARY ANGIOGRAPHY Left 11/10/2017   Procedure: LEFT HEART CATH AND CORONARY ANGIOGRAPHY;  Surgeon: Isaias Cowman, MD;  Location: [place] CV LAB;  Service: Cardiovascular;  Laterality: Left;   LEFT HEART CATHETERIZATION WITH CORONARY ANGIOGRAM N/A 01/11/2013   Procedure: LEFT HEART CATHETERIZATION WITH CORONARY ANGIOGRAM;  Surgeon: Sinclair Grooms, MD;  Location: [facility] CATH LAB;  Service: Cardiovascular;  Laterality: N/A;   TOTAL KNEE ARTHROPLASTY  08/30/2011   Procedure: TOTAL KNEE ARTHROPLASTY;  Surgeon: Lorn Junes, MD;  Location: [place];  Service: Orthopedics;  Laterality: Left;    Family Psychiatric History: Reviewed family psychiatric history from progress note on 05/25/2017  Family History:  Family History  Problem Relation Age of Onset   Cancer Mother    Hypertension Father    Heart disease Father    Alcohol abuse Father    Breast cancer Neg Hx     Social History: Reviewed social history from progress note on 05/25/2017 Social History   Socioeconomic History   Marital status: Divorced    Spouse name: Not on file   Number of children: 2   Years of education: 11   Highest education level: 11th grade  Occupational History    Employer: [employer]  Tobacco Use   Smoking status: Former    Packs/day: 2.00    Years: 15.00    Pack years: 30.00    Types: Cigarettes    Quit date: 02/09/1995    Years since quitting: 25.5   Smokeless tobacco: Never  Vaping Use   Vaping Use: Never used  Substance and Sexual Activity   Alcohol use: No    Alcohol/week: 0.0 standard drinks   Drug use: No   Sexual activity: Not Currently  Other Topics Concern   Not on file  Social History Narrative   Patient lives at home alone. Patient works at [employer].   Caffeine daily- 2   Right handed.   Education- 11 th grade   Social Determinants of Radio broadcast assistant Strain: Not on  file  Food Insecurity: Not on file  Transportation Needs: Not on file  Physical Activity: Not on file  Stress: Not on file  Social Connections: Not on file    Allergies:  Allergies  Allergen Reactions   Prolixin Decanoate [Fluphenazine]     Ineffective    Benztropine Other (See Comments)    Hair fall out  Suicide thoughts   Benztropine Mesylate Other (See Comments)   Buprenorphine Hcl Other (See Comments)    Unable to void   Carbidopa-Levodopa Nausea Only   Codeine Hives   Cymbalta [Duloxetine Hcl] Other (See Comments)    Altered mental status, Alopecia, visual hallucinations, nightmares   Gabapentin Swelling   Lyrica [Pregabalin] Other (See Comments)    Alopecia, visual hallucinations, nightmares Altered mental status   Morphine And Related Other (See Comments)    Unable  to void   Nortripytline Hcl [Nortriptyline] Other (See Comments)    Hair loss and night mares    Nsaids     REACTION: palpitations, diaphoresis   Penicillins Nausea And Vomiting and Other (See Comments)    REACTION: upset stomach Has patient had a PCN reaction causing immediate rash, facial/tongue/throat swelling, SOB or lightheadedness with hypotension: No Has patient had a PCN reaction causing severe rash involving mucus membranes or skin necrosis: No Has patient had a PCN reaction that required hospitalization: No Has patient had a PCN reaction occurring within the last 10 years: No If all of the above answers are "NO", then may proceed with Cephalosporin use.   Wellbutrin [Bupropion]     Constipation, mood swings    Metabolic Disorder Labs: Lab Results  Component Value Date   HGBA1C 6.2 (H) 03/06/2017   MPG 131.24 03/06/2017   MPG 108 05/17/2015   Lab Results  Component Value Date   PROLACTIN 4.5 (L) 06/13/2020   PROLACTIN 64.2 (H) 03/06/2017   Lab Results  Component Value Date   CHOL 146 08/22/2018   TRIG 172 (H) 08/22/2018   HDL 49 08/22/2018   CHOLHDL 3.0 08/22/2018   VLDL 34  08/22/2018   LDLCALC 63 08/22/2018   LDLCALC 59 03/06/2017   Lab Results  Component Value Date   TSH 0.959 08/22/2018   TSH 2.766 08/24/2017    Therapeutic Level Labs: No results found for: LITHIUM No results found for: VALPROATE No components found for:  CBMZ  Current Medications: Current Outpatient Medications  Medication Sig Dispense Refill   ACCU-CHEK GUIDE test strip      acetaminophen (TYLENOL) 500 MG tablet Take 1,000 mg by mouth every 6 (six) hours as needed for moderate pain or headache.      albuterol (PROVENTIL HFA;VENTOLIN HFA) 108 (90 Base) MCG/ACT inhaler Inhale 2 puffs into the lungs every 6 (six) hours as needed for wheezing or shortness of breath.     Alcohol Swabs (B-D SINGLE USE SWABS REGULAR) PADS      amantadine (SYMMETREL) 100 MG capsule Take 1 capsule (100 mg total) by mouth 2 (two) times daily. 180 capsule 0   aspirin EC 81 MG tablet Take 81 mg by mouth daily. Swallow whole.     Blood Glucose Calibration (ACCU-CHEK AVIVA) SOLN      Blood Glucose Monitoring Suppl (ACCU-CHEK AVIVA PLUS) w/Device KIT      budesonide-formoterol (SYMBICORT) 160-4.5 MCG/ACT inhaler Inhale into the lungs.     carbidopa-levodopa (SINEMET IR) 25-100 MG tablet Take by mouth.     Cholecalciferol (VITAMIN D-3) 125 MCG (5000 UT) TABS Take 5,000 Units by mouth daily.     clindamycin (CLEOCIN) 300 MG capsule Take 600 mg by mouth See admin instructions. Take 600 mg 1 hour prior to dental work     Cyanocobalamin (B-12 COMPLIANCE INJECTION IJ) Inject 1 Dose as directed every 30 (thirty) days.     cyclobenzaprine (FLEXERIL) 5 MG tablet Take 5 mg by mouth 2 (two) times daily.     diphenhydrAMINE (BENADRYL) 25 MG tablet Take 25 mg by mouth daily as needed for allergies.     docusate sodium (COLACE) 50 MG capsule Take 100 mg by mouth 2 (two) times daily.     doxycycline (MONODOX) 100 MG capsule      Erenumab-aooe 140 MG/ML SOAJ Inject into the skin.     FLUoxetine (PROZAC) 40 MG capsule Take 2  capsules (80 mg total) by mouth daily. 180 capsule 0  furosemide (LASIX) 20 MG tablet      glimepiride (AMARYL) 2 MG tablet Take 2 mg by mouth 2 (two) times daily.     HYDROcodone-acetaminophen (NORCO) 7.5-325 MG tablet 1 tablet as needed     insulin NPH-regular Human (NOVOLIN 70/30) (70-30) 100 UNIT/ML injection Inject 15 units subcutaneously twice daily with meals     Insulin Syringe-Needle U-100 (INSULIN SYRINGE 1CC/31GX5/16") 31G X 5/16" 1 ML MISC See admin instructions.     Lancets Misc. (ACCU-CHEK SOFTCLIX LANCET DEV) KIT Use 1 each 3 (three) times daily Use as instructed.     levothyroxine (SYNTHROID) 50 MCG tablet Take by mouth.     metFORMIN (GLUCOPHAGE) 500 MG tablet Take by mouth.     OLANZapine (ZYPREXA) 2.5 MG tablet Take 1 tablet (2.5 mg total) by mouth at bedtime. 90 tablet 0   ondansetron (ZOFRAN) 4 MG tablet Take 4 mg by mouth every 8 (eight) hours as needed for nausea or vomiting.     ondansetron (ZOFRAN-ODT) 4 MG disintegrating tablet      pravastatin (PRAVACHOL) 40 MG tablet Take by mouth.     RELION INSULIN SYRINGE 31G X 15/64" 1 ML MISC      rizatriptan (MAXALT) 10 MG tablet Take 10 mg by mouth 2 (two) times daily as needed for migraine. May repeat in 2 hours if needed     rOPINIRole (REQUIP) 0.25 MG tablet Take by mouth.     traMADol (ULTRAM) 50 MG tablet Take 2 tablets (100 mg total) by mouth 2 (two) times daily for 15 days. (Patient taking differently: Take 50-100 mg by mouth See admin instructions. Take 2 tablets (100 mg) by mouth in the morning, 1 tablet (50 mg) by mouth in the evening, & 1 tablet (50 mg) by mouth at night.) 30 tablet 1   traZODone (DESYREL) 100 MG tablet Take 0.5-1 tablets (50-100 mg total) by mouth at bedtime as needed for sleep. 90 tablet 1   verapamil (CALAN-SR) 180 MG CR tablet Take by mouth.     No current facility-administered medications for this visit.     Musculoskeletal: Strength & Muscle Tone:  UTA Gait & Station:  Walks with a  cane Patient leans: N/A  Psychiatric Specialty Exam: Review of Systems  Neurological:  Positive for tremors and headaches.  Psychiatric/Behavioral:  Positive for hallucinations and sleep disturbance. The patient is nervous/anxious.   All other systems reviewed and are negative.  Blood pressure 112/74, pulse 94, temperature 98.9 F (37.2 C), temperature source Temporal, weight 245 lb 12.8 oz (111.5 kg).Body mass index is 40.9 kg/m.  General Appearance: Casual  Eye Contact:  Fair  Speech:  Clear and Coherent  Volume:  Normal  Mood:  Anxious  Affect:  Congruent  Thought Process:  Goal Directed and Descriptions of Associations: Intact  Orientation:  Full (Time, Place, and Person)  Thought Content: Hallucinations: Auditory   Suicidal Thoughts:  No  Homicidal Thoughts:  No  Memory:  Immediate;   Fair Recent;   Fair Remote;   Fair  Judgement:  Fair  Insight:  Fair  Psychomotor Activity:  Tremor  Concentration:  Concentration: Fair and Attention Span: Fair  Recall:  [employer] of Knowledge: Fair  Language: Fair  Akathisia:  No  Handed:  Right  AIMS (if indicated): done  Assets:  Communication Skills Desire for Improvement Housing Social Support Transportation  ADL's:  Intact  Cognition: WNL  Sleep:  Poor   Screenings: [place] Visit  from 06/13/2020 in [place] Visit from 04/08/2020 in [place] Admission (Discharged) from OP Visit from 08/30/2018 in [place] Admission (Discharged) from 08/22/2018 in [place] [facility] Office Visit from 12/01/2017 in [place] Total Score 4 10 0 0 1      AUDIT    Flowsheet Row Admission (Discharged) from OP Visit from 08/30/2018 in [place] Admission (Discharged) from 08/22/2018 in [place] [facility] Admission  (Discharged) from 03/03/2017 in [place] [facility]  Alcohol Use Disorder Identification Test Final Score (AUDIT) 0 0 0      GAD-7    Flowsheet Row Office Visit from 08/19/2020 in [place] Office Visit from 08/22/2015 in [place] at [facility]  Total GAD-7 Score 5 6      PHQ2-9    [place] Visit from 08/19/2020 in [place] Visit from 06/13/2020 in [place] from 04/08/2020 in [place] Video Visit from 03/26/2020 in [place] Visit from 03/23/2016 in [place]  PHQ-2 Total Score 0 1 0 0 0      [place] Visit from 06/13/2020 in [place] Office Visit from 04/08/2020 in [place] Video Visit from 03/26/2020 in [place] Error: Question 6 not populated No Risk No Risk        Assessment and Plan: SHATARIA CRIST is a 60 year old Caucasian female who has a history of schizoaffective disorder, PTSD, migraine headaches, drug induced Parkinson's disease was evaluated in office today.  Patient is currently struggling with anxiety and will benefit from the following plan.  Plan Schizoaffective disorder-unstable Zyprexa 2.5 mg p.o. nightly  GAD-unstable Increase fluoxetine to 80 mg p.o. daily Patient was referred for CBT.  PTSD-stable Continue to monitor  Insomnia-unstable Trazodone 50 to 100 mg p.o. nightly She has been taking only 50 mg, could increase it to 100 mg as needed  Drug induced Parkinson's disease-improving Continue follow-up with neurology Amantadine 100 mg p.o. twice daily Carbidopa-levodopa as well as Aleve As prescribed by neurology  High risk medication use-reviewed prolactin-dated  06/13/2020-4.5-low.  Discussed with patient  I have reviewed notes per Dr. Manuella Ghazi dated 06/02/2020- Multifactorial headaches-status post SPG block 10/25/2019, rizatriptan 10 mg as needed Start time a week monthly injection for migraine prevention Memory loss-multifactorial Drug induced parkinsonian features-continue ropinirole and carbidopa levodopa. Referral for physical therapy.  Patient with continued headaches likely contributing to worsening mood, sleep problems, will benefit from sufficient pain management.  We will coordinate care with Dr. Pecola Lawless.   Follow-up in clinic in 6 weeks or sooner in office.  I have spent atleast 30 minutes face to face with patient today which includes the time spent for preparing to see the patient ( e.g., review of test, records ), obtaining and to review and separately obtained history , ordering medications and test ,psychoeducation and supportive psychotherapy and care coordination,as well as documenting clinical information in electronic health record,interpreting and communication of test results    This note was generated in part or whole with voice recognition software. Voice recognition is usually quite accurate but there are transcription errors that can and very often do occur. I apologize for any typographical errors that were not detected and corrected.  Ursula Alert, MD 08/20/2020, 1:31 PM

## 2020-08-27 DIAGNOSIS — E1165 Type 2 diabetes mellitus with hyperglycemia: Secondary | ICD-10-CM | POA: Diagnosis not present

## 2020-08-27 DIAGNOSIS — Z794 Long term (current) use of insulin: Secondary | ICD-10-CM | POA: Diagnosis not present

## 2020-08-27 DIAGNOSIS — E669 Obesity, unspecified: Secondary | ICD-10-CM | POA: Diagnosis not present

## 2020-08-27 DIAGNOSIS — E782 Mixed hyperlipidemia: Secondary | ICD-10-CM | POA: Diagnosis not present

## 2020-08-27 DIAGNOSIS — I10 Essential (primary) hypertension: Secondary | ICD-10-CM | POA: Diagnosis not present

## 2020-09-01 ENCOUNTER — Other Ambulatory Visit (HOSPITAL_COMMUNITY): Payer: Self-pay | Admitting: Internal Medicine

## 2020-09-01 ENCOUNTER — Other Ambulatory Visit: Payer: Self-pay | Admitting: Internal Medicine

## 2020-09-01 DIAGNOSIS — G20 Parkinson's disease: Secondary | ICD-10-CM

## 2020-09-01 DIAGNOSIS — G20A1 Parkinson's disease without dyskinesia, without mention of fluctuations: Secondary | ICD-10-CM

## 2020-09-01 DIAGNOSIS — R519 Headache, unspecified: Secondary | ICD-10-CM

## 2020-09-01 DIAGNOSIS — R251 Tremor, unspecified: Secondary | ICD-10-CM

## 2020-09-06 ENCOUNTER — Ambulatory Visit
Admission: RE | Admit: 2020-09-06 | Discharge: 2020-09-06 | Disposition: A | Discharge status: Home / Self Care | Payer: Medicare HMO | Source: Ambulatory Visit | Attending: Internal Medicine | Admitting: Internal Medicine

## 2020-09-06 DIAGNOSIS — G20 Parkinson's disease: Secondary | ICD-10-CM | POA: Diagnosis not present

## 2020-09-06 DIAGNOSIS — R251 Tremor, unspecified: Secondary | ICD-10-CM | POA: Diagnosis not present

## 2020-09-06 DIAGNOSIS — G319 Degenerative disease of nervous system, unspecified: Secondary | ICD-10-CM | POA: Diagnosis not present

## 2020-09-06 DIAGNOSIS — R519 Headache, unspecified: Secondary | ICD-10-CM | POA: Insufficient documentation

## 2020-09-07 ENCOUNTER — Ambulatory Visit: Admission: RE | Admit: 2020-09-07 | Payer: Medicare HMO | Source: Ambulatory Visit

## 2020-09-09 DIAGNOSIS — E538 Deficiency of other specified B group vitamins: Secondary | ICD-10-CM | POA: Diagnosis not present

## 2020-10-01 ENCOUNTER — Other Ambulatory Visit: Payer: Self-pay | Admitting: Psychiatry

## 2020-10-01 DIAGNOSIS — G2119 Other drug induced secondary parkinsonism: Secondary | ICD-10-CM

## 2020-10-14 ENCOUNTER — Other Ambulatory Visit: Payer: Self-pay

## 2020-10-14 ENCOUNTER — Encounter: Payer: Self-pay | Admitting: Psychiatry

## 2020-10-14 ENCOUNTER — Ambulatory Visit: Payer: Medicare HMO | Admitting: Psychiatry

## 2020-10-14 VITALS — BP 119/81 | HR 82 | Ht 64.5 in | Wt 244.4 lb

## 2020-10-14 DIAGNOSIS — G4701 Insomnia due to medical condition: Secondary | ICD-10-CM | POA: Diagnosis not present

## 2020-10-14 DIAGNOSIS — F251 Schizoaffective disorder, depressive type: Secondary | ICD-10-CM

## 2020-10-14 DIAGNOSIS — F4310 Post-traumatic stress disorder, unspecified: Secondary | ICD-10-CM | POA: Diagnosis not present

## 2020-10-14 DIAGNOSIS — F411 Generalized anxiety disorder: Secondary | ICD-10-CM

## 2020-10-14 DIAGNOSIS — G2119 Other drug induced secondary parkinsonism: Secondary | ICD-10-CM

## 2020-10-14 MED ORDER — OLANZAPINE 2.5 MG PO TABS: 5.0000 mg | ORAL_TABLET | Freq: Every day | ORAL | 0 refills | Status: DC
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
## 2020-10-28 ENCOUNTER — Telehealth: Payer: Self-pay

## 2020-10-28 NOTE — Telephone Encounter (Signed)
pt called left message that medication is not working and wants to know if there something else she can take.

## 2020-10-28 NOTE — Telephone Encounter (Signed)
Pls schedule this patient for an appointment

## 2020-11-03 DIAGNOSIS — R8290 Unspecified abnormal findings in urine: Secondary | ICD-10-CM | POA: Diagnosis not present

## 2020-11-03 DIAGNOSIS — G43719 Chronic migraine without aura, intractable, without status migrainosus: Secondary | ICD-10-CM | POA: Diagnosis not present

## 2020-11-03 DIAGNOSIS — E1165 Type 2 diabetes mellitus with hyperglycemia: Secondary | ICD-10-CM | POA: Diagnosis not present

## 2020-11-03 DIAGNOSIS — J4540 Moderate persistent asthma, uncomplicated: Secondary | ICD-10-CM | POA: Diagnosis not present

## 2020-11-03 DIAGNOSIS — G8929 Other chronic pain: Secondary | ICD-10-CM | POA: Diagnosis not present

## 2020-11-03 DIAGNOSIS — G2119 Other drug induced secondary parkinsonism: Secondary | ICD-10-CM | POA: Diagnosis not present

## 2020-11-03 DIAGNOSIS — M545 Low back pain, unspecified: Secondary | ICD-10-CM | POA: Diagnosis not present

## 2020-11-03 DIAGNOSIS — N1831 Chronic kidney disease, stage 3a: Secondary | ICD-10-CM | POA: Diagnosis not present

## 2020-11-03 DIAGNOSIS — M170 Bilateral primary osteoarthritis of knee: Secondary | ICD-10-CM | POA: Diagnosis not present

## 2020-11-03 DIAGNOSIS — Z6841 Body Mass Index (BMI) 40.0 and over, adult: Secondary | ICD-10-CM | POA: Diagnosis not present

## 2020-11-10 DIAGNOSIS — Z6841 Body Mass Index (BMI) 40.0 and over, adult: Secondary | ICD-10-CM | POA: Diagnosis not present

## 2020-11-10 DIAGNOSIS — F209 Schizophrenia, unspecified: Secondary | ICD-10-CM | POA: Diagnosis not present

## 2020-11-10 DIAGNOSIS — E1165 Type 2 diabetes mellitus with hyperglycemia: Secondary | ICD-10-CM | POA: Diagnosis not present

## 2020-11-10 DIAGNOSIS — G2119 Other drug induced secondary parkinsonism: Secondary | ICD-10-CM | POA: Diagnosis not present

## 2020-11-10 DIAGNOSIS — Z794 Long term (current) use of insulin: Secondary | ICD-10-CM | POA: Diagnosis not present

## 2020-11-10 DIAGNOSIS — R519 Headache, unspecified: Secondary | ICD-10-CM | POA: Diagnosis not present

## 2020-11-10 DIAGNOSIS — Z0000 Encounter for general adult medical examination without abnormal findings: Secondary | ICD-10-CM | POA: Diagnosis not present

## 2020-11-10 DIAGNOSIS — Z79899 Other long term (current) drug therapy: Secondary | ICD-10-CM | POA: Diagnosis not present

## 2020-11-11 ENCOUNTER — Ambulatory Visit: Payer: Medicare HMO | Admitting: Psychiatry

## 2020-11-12 DIAGNOSIS — R2681 Unsteadiness on feet: Secondary | ICD-10-CM | POA: Diagnosis not present

## 2020-11-12 DIAGNOSIS — G2119 Other drug induced secondary parkinsonism: Secondary | ICD-10-CM | POA: Diagnosis not present

## 2020-11-12 DIAGNOSIS — M6281 Muscle weakness (generalized): Secondary | ICD-10-CM | POA: Diagnosis not present

## 2020-11-13 ENCOUNTER — Encounter: Payer: Self-pay | Admitting: Psychiatry

## 2020-11-13 ENCOUNTER — Ambulatory Visit (INDEPENDENT_AMBULATORY_CARE_PROVIDER_SITE_OTHER): Payer: Medicare HMO | Admitting: Psychiatry

## 2020-11-13 ENCOUNTER — Other Ambulatory Visit: Payer: Self-pay

## 2020-11-13 VITALS — BP 145/82 | HR 82 | Temp 97.9°F | Wt 244.4 lb

## 2020-11-13 DIAGNOSIS — F251 Schizoaffective disorder, depressive type: Secondary | ICD-10-CM

## 2020-11-13 DIAGNOSIS — F4310 Post-traumatic stress disorder, unspecified: Secondary | ICD-10-CM

## 2020-11-13 DIAGNOSIS — F411 Generalized anxiety disorder: Secondary | ICD-10-CM | POA: Diagnosis not present

## 2020-11-13 DIAGNOSIS — G2119 Other drug induced secondary parkinsonism: Secondary | ICD-10-CM | POA: Diagnosis not present

## 2020-11-13 DIAGNOSIS — G4701 Insomnia due to medical condition: Secondary | ICD-10-CM | POA: Diagnosis not present

## 2020-11-13 MED ORDER — FLUOXETINE HCL 40 MG PO CAPS: 80.0000 mg | ORAL_CAPSULE | Freq: Every day | ORAL | 0 refills | Status: DC

## 2020-11-13 MED ORDER — OLANZAPINE 2.5 MG PO TABS: 2.5000 mg | ORAL_TABLET | Freq: Every day | ORAL | 0 refills | Status: DC

## 2020-11-13 NOTE — Progress Notes (Signed)
[facility] MD OP Progress Note  11/13/2020 1:21 PM Heather Haley  MRN:  [id number]  Chief Complaint:  Chief Complaint   Follow-up; Depression    HPI: Heather Haley is a 60 year old Caucasian female, lives in [place], has a history of schizoaffective disorder, PTSD, GAD, drug induced Parkinson's disease, headache, memory problems, diabetes melitis, chronic pain was evaluated in office today.  Patient reports she recently had medication changes at her primary care doctor's office, Dr. Ginette Pitman.  She was started on amitriptyline for headaches.  She is also currently on Requip and continues to take carbidopa levodopa for her abnormal involuntary movements.  Patient reports she has not noticed any bad side effects from the amitriptyline however does have short-term memory loss, however this has been going on for a while, likely not due to this new medication.  She reports she continues to have anxiety attacks, last for 1 hour , couple of times a week.  She does try to relax and cope with it as best as she can.  Patient reports sleep is overall okay.  Denies any hallucinations.  Did not appear to be delusional or paranoid today.  Denies any suicidality, homicidality.  Patient denies any other concerns today.  Visit Diagnosis:    ICD-10-CM   1. Schizoaffective disorder, depressive type ([place])  F25.1 FLUoxetine (PROZAC) 40 MG capsule    OLANZapine (ZYPREXA) 2.5 MG tablet    2. PTSD (post-traumatic stress disorder)  F43.10 FLUoxetine (PROZAC) 40 MG capsule    3. Generalized anxiety disorder  F41.1 FLUoxetine (PROZAC) 40 MG capsule    4. Drug-induced Parkinson's disease ([place])  G21.19     5. Insomnia due to medical condition  G47.01    mood , headache      Past Psychiatric History: Reviewed past psychiatric history from progress note on 05/25/2017.  Past Medical History:  Past Medical History:  Diagnosis Date   Anxiety    Asthma    Chronic kidney disease    Chronic pain     previously saw Dr. Consuela Mimes in pain clinic, then saw pain specialist in [facility]   Depression    Diabetes mellitus ([place])    Frequency of urination    GERD (gastroesophageal reflux disease)    Headache(784.0)    High cholesterol    History of kidney stones    Hypertension    IBS (irritable bowel syndrome)    Left ankle instability    Left knee DJD    Lumbar Degenerative Disc Disease of  10/11/2014   Neuromuscular disorder (HCC)    Osteoarthritis of hip (Right) 05/05/2015   Other enthesopathy of ankle and tarsus 12/15/2009   Qualifier: Diagnosis of  By: Oneida Alar MD, KARL     Parkinson's disease ([place])    Peripheral sensory neuropathy (Bilateral) 11/19/2014   Postoperative nausea and vomiting    Schizophrenia ([place])     Past Surgical History:  Procedure Laterality Date   ABDOMINAL HYSTERECTOMY     ANKLE SURGERY Left    x 2   APPENDECTOMY     CARDIAC CATHETERIZATION     COLONOSCOPY  2013   COLONOSCOPY WITH PROPOFOL N/A 04/25/2017   Procedure: COLONOSCOPY WITH PROPOFOL;  Surgeon: Manya Silvas, MD;  Location: [facility] ENDOSCOPY;  Service: Endoscopy;  Laterality: N/A;   CYSTOSCOPY/URETEROSCOPY/HOLMIUM LASER/STENT PLACEMENT Left 09/11/2019   Procedure: CYSTOSCOPY/URETEROSCOPY/HOLMIUM LASER/STENT PLACEMENT;  Surgeon: Abbie Sons, MD;  Location: [facility] ORS;  Service: Urology;  Laterality: Left;   ESOPHAGOGASTRODUODENOSCOPY (EGD) WITH PROPOFOL N/A 10/03/2014  Procedure: ESOPHAGOGASTRODUODENOSCOPY (EGD) WITH PROPOFOL;  Surgeon: Josefine Class, MD;  Location: [facility] ENDOSCOPY;  Service: Endoscopy;  Laterality: N/A;   ESOPHAGOGASTRODUODENOSCOPY (EGD) WITH PROPOFOL  04/25/2017   Procedure: ESOPHAGOGASTRODUODENOSCOPY (EGD) WITH PROPOFOL;  Surgeon: Manya Silvas, MD;  Location: [facility] ENDOSCOPY;  Service: Endoscopy;;   JOINT REPLACEMENT Left    knee   KNEE ARTHROSCOPY  1997   left knee   LEFT HEART CATH AND CORONARY ANGIOGRAPHY Left 11/10/2017   Procedure: LEFT HEART CATH AND CORONARY  ANGIOGRAPHY;  Surgeon: Isaias Cowman, MD;  Location: [place] CV LAB;  Service: Cardiovascular;  Laterality: Left;   LEFT HEART CATHETERIZATION WITH CORONARY ANGIOGRAM N/A 01/11/2013   Procedure: LEFT HEART CATHETERIZATION WITH CORONARY ANGIOGRAM;  Surgeon: Sinclair Grooms, MD;  Location: [facility] CATH LAB;  Service: Cardiovascular;  Laterality: N/A;   TOTAL KNEE ARTHROPLASTY  08/30/2011   Procedure: TOTAL KNEE ARTHROPLASTY;  Surgeon: Lorn Junes, MD;  Location: [place];  Service: Orthopedics;  Laterality: Left;    Family Psychiatric History: Reviewed family psychiatric history from progress note on 05/25/2017  Family History:  Family History  Problem Relation Age of Onset   Cancer Mother    Hypertension Father    Heart disease Father    Alcohol abuse Father    Breast cancer Neg Hx     Social History: Reviewed social history from progress note on 05/25/2017 Social History   Socioeconomic History   Marital status: Divorced    Spouse name: Not on file   Number of children: 2   Years of education: 11   Highest education level: 11th grade  Occupational History    Employer: [employer]  Tobacco Use   Smoking status: Former    Packs/day: 2.00    Years: 15.00    Pack years: 30.00    Types: Cigarettes    Quit date: 02/09/1995    Years since quitting: 25.7   Smokeless tobacco: Never  Vaping Use   Vaping Use: Never used  Substance and Sexual Activity   Alcohol use: No    Alcohol/week: 0.0 standard drinks   Drug use: No   Sexual activity: Not Currently  Other Topics Concern   Not on file  Social History Narrative   Patient lives at home alone. Patient works at [employer].   Caffeine daily- 2   Right handed.   Education- 11 th grade   Social Determinants of Radio broadcast assistant Strain: Not on file  Food Insecurity: Not on file  Transportation Needs: Not on file  Physical Activity: Not on file  Stress: Not on file  Social Connections: Not on file     Allergies:  Allergies  Allergen Reactions   Prolixin Decanoate [Fluphenazine]     Ineffective    Benztropine Other (See Comments)    Hair fall out  Suicide thoughts   Benztropine Mesylate Other (See Comments)   Buprenorphine Hcl Other (See Comments)    Unable to void   Carbidopa-Levodopa Nausea Only   Codeine Hives   Cymbalta [Duloxetine Hcl] Other (See Comments)    Altered mental status, Alopecia, visual hallucinations, nightmares   Gabapentin Swelling   Lyrica [Pregabalin] Other (See Comments)    Alopecia, visual hallucinations, nightmares Altered mental status   Morphine And Related Other (See Comments)    Unable to void   Nortripytline Hcl [Nortriptyline] Other (See Comments)    Hair loss and night mares    Nsaids     REACTION: palpitations, diaphoresis  Penicillins Nausea And Vomiting and Other (See Comments)    REACTION: upset stomach Has patient had a PCN reaction causing immediate rash, facial/tongue/throat swelling, SOB or lightheadedness with hypotension: No Has patient had a PCN reaction causing severe rash involving mucus membranes or skin necrosis: No Has patient had a PCN reaction that required hospitalization: No Has patient had a PCN reaction occurring within the last 10 years: No If all of the above answers are "NO", then may proceed with Cephalosporin use.   Wellbutrin [Bupropion]     Constipation, mood swings    Metabolic Disorder Labs: Lab Results  Component Value Date   HGBA1C 6.2 (H) 03/06/2017   MPG 131.24 03/06/2017   MPG 108 05/17/2015   Lab Results  Component Value Date   PROLACTIN 4.5 (L) 06/13/2020   PROLACTIN 64.2 (H) 03/06/2017   Lab Results  Component Value Date   CHOL 146 08/22/2018   TRIG 172 (H) 08/22/2018   HDL 49 08/22/2018   CHOLHDL 3.0 08/22/2018   VLDL 34 08/22/2018   LDLCALC 63 08/22/2018   LDLCALC 59 03/06/2017   Lab Results  Component Value Date   TSH 0.959 08/22/2018   TSH 2.766 08/24/2017     Therapeutic Level Labs: No results found for: LITHIUM No results found for: VALPROATE No components found for:  CBMZ  Current Medications: Current Outpatient Medications  Medication Sig Dispense Refill   ACCU-CHEK GUIDE test strip      acetaminophen (TYLENOL) 500 MG tablet Take 1,000 mg by mouth every 6 (six) hours as needed for moderate pain or headache.      albuterol (PROVENTIL HFA;VENTOLIN HFA) 108 (90 Base) MCG/ACT inhaler Inhale 2 puffs into the lungs every 6 (six) hours as needed for wheezing or shortness of breath.     Alcohol Swabs (B-D SINGLE USE SWABS REGULAR) PADS      amitriptyline (ELAVIL) 10 MG tablet      aspirin EC 81 MG tablet Take 81 mg by mouth daily. Swallow whole.     Blood Glucose Calibration (ACCU-CHEK AVIVA) SOLN      Blood Glucose Monitoring Suppl (ACCU-CHEK AVIVA PLUS) w/Device KIT      budesonide-formoterol (SYMBICORT) 160-4.5 MCG/ACT inhaler Inhale into the lungs.     butalbital-acetaminophen-caffeine (FIORICET) 50-325-40 MG tablet      carbidopa-levodopa (SINEMET CR) 50-200 MG tablet Take 1 tablet by mouth at bedtime.     carbidopa-levodopa (SINEMET IR) 25-100 MG tablet Take by mouth.     Cholecalciferol (VITAMIN D-3) 125 MCG (5000 UT) TABS Take 5,000 Units by mouth daily.     clindamycin (CLEOCIN) 300 MG capsule Take 600 mg by mouth See admin instructions. Take 600 mg 1 hour prior to dental work     Cyanocobalamin (B-12 COMPLIANCE INJECTION IJ) Inject 1 Dose as directed every 30 (thirty) days.     cyclobenzaprine (FLEXERIL) 5 MG tablet Take 5 mg by mouth 2 (two) times daily.     diphenhydrAMINE (BENADRYL) 25 MG tablet Take 25 mg by mouth daily as needed for allergies.     docusate sodium (COLACE) 50 MG capsule Take 100 mg by mouth 2 (two) times daily.     insulin NPH-regular Human (NOVOLIN 70/30) (70-30) 100 UNIT/ML injection Inject 15 units subcutaneously twice daily with meals     levothyroxine (SYNTHROID) 50 MCG tablet Take by mouth.     metFORMIN  (GLUCOPHAGE) 500 MG tablet Take by mouth.     montelukast (SINGULAIR) 10 MG tablet TAKE 1 TABLET EVERY NIGHT  ondansetron (ZOFRAN) 4 MG tablet Take 4 mg by mouth every 8 (eight) hours as needed for nausea or vomiting.     ondansetron (ZOFRAN-ODT) 4 MG disintegrating tablet      pravastatin (PRAVACHOL) 40 MG tablet Take by mouth.     RELION INSULIN SYRINGE 31G X 15/64" 1 ML MISC      rizatriptan (MAXALT) 10 MG tablet Take 10 mg by mouth 2 (two) times daily as needed for migraine. May repeat in 2 hours if needed     rOPINIRole (REQUIP) 0.25 MG tablet Take by mouth.     traMADol (ULTRAM) 50 MG tablet 2 tablet as needed     verapamil (CALAN-SR) 120 MG CR tablet Take by mouth.     FLUoxetine (PROZAC) 40 MG capsule Take 2 capsules (80 mg total) by mouth daily. 180 capsule 0   OLANZapine (ZYPREXA) 2.5 MG tablet Take 1 tablet (2.5 mg total) by mouth at bedtime. 90 tablet 0   No current facility-administered medications for this visit.     Musculoskeletal: Strength & Muscle Tone: within normal limits Gait & Station:  walks with cane Patient leans: Right  Psychiatric Specialty Exam: Review of Systems  Neurological:  Positive for tremors.  Psychiatric/Behavioral:  The patient is nervous/anxious.   All other systems reviewed and are negative.  Blood pressure (!) 145/82, pulse 82, temperature 97.9 F (36.6 C), temperature source Temporal, weight 244 lb 6.4 oz (110.9 kg).Body mass index is 41.3 kg/m.  General Appearance: Casual  Eye Contact:  Good  Speech:  Normal Rate  Volume:  Normal  Mood:  Anxious  Affect:  Congruent  Thought Process:  Goal Directed and Descriptions of Associations: Intact  Orientation:  Full (Time, Place, and Person)  Thought Content: Logical   Suicidal Thoughts:  No  Homicidal Thoughts:  No  Memory:  Immediate;   Fair Recent;   Fair Remote;   Fair  Judgement:  Fair  Insight:  Fair  Psychomotor Activity:  Tremor  Concentration:  Concentration: Fair and  Attention Span: Fair  Recall:  [employer] of Knowledge: Fair  Language: Fair  Akathisia:  No  Handed:  Right  AIMS (if indicated): done  Assets:  Communication Skills Desire for Improvement Housing Social Support  ADL's:  Intact  Cognition: WNL  Sleep:  Fair   Screenings: [place] Office Visit from 11/13/2020 in [place] Office Visit from 06/13/2020 in [place] Visit from 04/08/2020 in [place] Admission (Discharged) from [place] from 08/30/2018 in [place] Admission (Discharged) from 08/22/2018 in [place] [facility]  AIMS Total Score _0 0 0      AUDIT    Flowsheet Row Admission (Discharged) from OP Visit from 08/30/2018 in [place] Admission (Discharged) from 08/22/2018 in [place] [facility] Admission (Discharged) from 03/03/2017 in [place] [facility]  Alcohol Use Disorder Identification Test Final Score (AUDIT) 0 0 0      GAD-7    Flowsheet Row Office Visit from 11/13/2020 in [place] Office Visit from 10/14/2020 in [place] Visit from 08/19/2020 in [place] Visit from 08/22/2015 in [place] at [facility]  Total GAD-7 Score _1 [employer]    [place] Office Visit from 11/13/2020 in [place] Office Visit  from 08/19/2020 in [place] Visit from 06/13/2020 in [place] Visit from 04/08/2020 in [place] Video Visit from 03/26/2020 in [place]  PHQ-2 Total Score 0 0 1 0 0      [place] Visit from 06/13/2020 in [place] Office Visit from 04/08/2020 in [place] Video Visit from 03/26/2020 in [place] Error: Question 6 not populated No Risk No Risk        Assessment and Plan: Heather Haley is a 60 year old Caucasian female who has a history of schizoaffective disorder, PTSD, migraine headache, drug induced Parkinson's disease was evaluated in office today.  Patient continues to have anxiety, as well as drug induced tremors, she will benefit from the following plan.  Plan Schizoaffective disorder-improving Patient reports she is currently taking Zyprexa 2.5 mg p.o. nightly although the dose was increased last visit.  Will continue Zyprexa 2.5 mg and she is improving. Discontinue trazodone since she is currently on Elavil. AIMS - 7  GAD-improving Fluoxetine 80 mg p.o. daily Continue Elavil 10 mg p.o. nightly-recently added for headache per primary care provider. Could increase the level gradually and could reduce the dose to fluoxetine in future sessions as needed.  PTSD-stable Fluoxetine 80 mg p.o. daily  Drug induced Parkinson's disease-improving Discontinue amantadine since she is currently on Requip and carbidopa levodopa-per her other providers-primary care as well as neurology.  Elevated blood pressure reading-patient advised to monitor blood pressure and follow up with primary care provider.  Follow-up in clinic in 3 weeks in person or sooner as needed.  This note was generated in part or whole with voice recognition software. Voice recognition is usually quite accurate but there are transcription errors that can and very often do occur. I apologize for any typographical errors that were not detected and corrected.     Ursula Alert, MD 11/14/2020, 8:26 AM

## 2020-11-17 ENCOUNTER — Telehealth: Payer: Self-pay

## 2020-12-02 NOTE — Telephone Encounter (Signed)
error 

## 2020-12-08 ENCOUNTER — Other Ambulatory Visit: Payer: Self-pay

## 2020-12-08 ENCOUNTER — Encounter: Payer: Self-pay | Admitting: Psychiatry

## 2020-12-08 ENCOUNTER — Ambulatory Visit (INDEPENDENT_AMBULATORY_CARE_PROVIDER_SITE_OTHER): Payer: Medicare HMO | Admitting: Psychiatry

## 2020-12-08 VITALS — BP 150/90 | HR 68 | Ht 64.0 in | Wt 257.0 lb

## 2020-12-08 DIAGNOSIS — F411 Generalized anxiety disorder: Secondary | ICD-10-CM | POA: Diagnosis not present

## 2020-12-08 DIAGNOSIS — G4701 Insomnia due to medical condition: Secondary | ICD-10-CM

## 2020-12-08 DIAGNOSIS — F4310 Post-traumatic stress disorder, unspecified: Secondary | ICD-10-CM

## 2020-12-08 DIAGNOSIS — G2119 Other drug induced secondary parkinsonism: Secondary | ICD-10-CM | POA: Diagnosis not present

## 2020-12-08 DIAGNOSIS — F251 Schizoaffective disorder, depressive type: Secondary | ICD-10-CM

## 2020-12-08 DIAGNOSIS — R030 Elevated blood-pressure reading, without diagnosis of hypertension: Secondary | ICD-10-CM | POA: Diagnosis not present

## 2020-12-08 MED ORDER — FLUOXETINE HCL 40 MG PO CAPS: 40.0000 mg | ORAL_CAPSULE | Freq: Every day | ORAL | 0 refills | Status: DC

## 2020-12-08 MED ORDER — AMITRIPTYLINE HCL 25 MG PO TABS
25.0000 mg | ORAL_TABLET | Freq: Every day | ORAL | 0 refills | Status: DC
Start: 2020-12-08 — End: 2021-01-28

## 2020-12-08 NOTE — Progress Notes (Signed)
[place] MD OP Progress Note  12/08/2020 12:52 PM Heather Haley  MRN:  [id number]  Chief Complaint:  Chief Complaint   Follow-up; Anxiety; Depression    HPI: Heather Haley is a 60 year old Caucasian female, lives in [place], has a history of schizoaffective disorder, PTSD, GAD, drug induced Parkinson's disease, headache, memory problems, diabetes mellitus, chronic pain was evaluated in office today.  Patient today reports she is currently struggling with a headache.  She has been struggling with headache on and off since the past 1 year or more.  She does have a provider who manages it for her.  Patient reports she continues to hear voices which are chronic however it is manageable.  It does not affect her much.  She is able to cope.  Patient does report sleep affected on and off due to the headache.  She is interested in dosage increase available.  She does not have any side effects.  Patient is compliant on medications as prescribed.  Denies side effects.  Patient denies any suicidality or homicidality.  She continues to have good support system from her daughter.  Patient denies any other concerns today.  Visit Diagnosis:    ICD-10-CM   1. Schizoaffective disorder, depressive type ([place])  F25.1 FLUoxetine (PROZAC) 40 MG capsule    2. PTSD (post-traumatic stress disorder)  F43.10 FLUoxetine (PROZAC) 40 MG capsule    amitriptyline (ELAVIL) 25 MG tablet    3. Generalized anxiety disorder  F41.1 FLUoxetine (PROZAC) 40 MG capsule    amitriptyline (ELAVIL) 25 MG tablet    4. Drug-induced Parkinson's disease ([place])  G21.19     5. Insomnia due to medical condition  G47.01    mood, headache    6. Elevated blood pressure reading  R03.0       Past Psychiatric History: Reviewed past psychiatric history from progress note on 05/25/2017  Past Medical History:  Past Medical History:  Diagnosis Date   Anxiety    Asthma    Chronic kidney disease    Chronic pain     previously saw Dr. Consuela Mimes in pain clinic, then saw pain specialist in [facility]   Depression    Diabetes mellitus ([place])    Frequency of urination    GERD (gastroesophageal reflux disease)    Headache(784.0)    High cholesterol    History of kidney stones    Hypertension    IBS (irritable bowel syndrome)    Left ankle instability    Left knee DJD    Lumbar Degenerative Disc Disease of  10/11/2014   Neuromuscular disorder ([place])    Osteoarthritis of hip (Right) 05/05/2015   Other enthesopathy of ankle and tarsus 12/15/2009   Qualifier: Diagnosis of  By: Oneida Alar MD, KARL     Parkinson's disease ([place])    Peripheral sensory neuropathy (Bilateral) 11/19/2014   Postoperative nausea and vomiting    Schizophrenia ([place])     Past Surgical History:  Procedure Laterality Date   ABDOMINAL HYSTERECTOMY     ANKLE SURGERY Left    x 2   APPENDECTOMY     CARDIAC CATHETERIZATION     COLONOSCOPY  2013   COLONOSCOPY WITH PROPOFOL N/A 04/25/2017   Procedure: COLONOSCOPY WITH PROPOFOL;  Surgeon: Manya Silvas, MD;  Location: [facility] ENDOSCOPY;  Service: Endoscopy;  Laterality: N/A;   CYSTOSCOPY/URETEROSCOPY/HOLMIUM LASER/STENT PLACEMENT Left 09/11/2019   Procedure: CYSTOSCOPY/URETEROSCOPY/HOLMIUM LASER/STENT PLACEMENT;  Surgeon: Abbie Sons, MD;  Location: [facility] ORS;  Service: Urology;  Laterality: Left;   ESOPHAGOGASTRODUODENOSCOPY (  EGD) WITH PROPOFOL N/A 10/03/2014   Procedure: ESOPHAGOGASTRODUODENOSCOPY (EGD) WITH PROPOFOL;  Surgeon: Josefine Class, MD;  Location: [facility] ENDOSCOPY;  Service: Endoscopy;  Laterality: N/A;   ESOPHAGOGASTRODUODENOSCOPY (EGD) WITH PROPOFOL  04/25/2017   Procedure: ESOPHAGOGASTRODUODENOSCOPY (EGD) WITH PROPOFOL;  Surgeon: Manya Silvas, MD;  Location: [facility] ENDOSCOPY;  Service: Endoscopy;;   JOINT REPLACEMENT Left    knee   KNEE ARTHROSCOPY  1997   left knee   LEFT HEART CATH AND CORONARY ANGIOGRAPHY Left 11/10/2017   Procedure: LEFT HEART CATH AND CORONARY  ANGIOGRAPHY;  Surgeon: Isaias Cowman, MD;  Location: [facility] CV LAB;  Service: Cardiovascular;  Laterality: Left;   LEFT HEART CATHETERIZATION WITH CORONARY ANGIOGRAM N/A 01/11/2013   Procedure: LEFT HEART CATHETERIZATION WITH CORONARY ANGIOGRAM;  Surgeon: Sinclair Grooms, MD;  Location: [facility] CATH LAB;  Service: Cardiovascular;  Laterality: N/A;   TOTAL KNEE ARTHROPLASTY  08/30/2011   Procedure: TOTAL KNEE ARTHROPLASTY;  Surgeon: Lorn Junes, MD;  Location: [place];  Service: Orthopedics;  Laterality: Left;    Family Psychiatric History: Reviewed family psychiatric history from progress note on 05/25/2017  Family History:  Family History  Problem Relation Age of Onset   Cancer Mother    Hypertension Father    Heart disease Father    Alcohol abuse Father    Breast cancer Neg Hx     Social History: Reviewed social history from progress note on 05/25/2017 Social History   Socioeconomic History   Marital status: Divorced    Spouse name: Not on file   Number of children: 2   Years of education: 11   Highest education level: 11th grade  Occupational History    Employer: [employer]  Tobacco Use   Smoking status: Former    Packs/day: 2.00    Years: 15.00    Pack years: 30.00    Types: Cigarettes    Quit date: 02/09/1995    Years since quitting: 25.8   Smokeless tobacco: Never  Vaping Use   Vaping Use: Never used  Substance and Sexual Activity   Alcohol use: No    Alcohol/week: 0.0 standard drinks   Drug use: No   Sexual activity: Not Currently  Other Topics Concern   Not on file  Social History Narrative   Patient lives at home alone. Patient works at [employer].   Caffeine daily- 2   Right handed.   Education- 11 th grade   Social Determinants of Radio broadcast assistant Strain: Not on file  Food Insecurity: Not on file  Transportation Needs: Not on file  Physical Activity: Not on file  Stress: Not on file  Social Connections: Not on file     Allergies:  Allergies  Allergen Reactions   Prolixin Decanoate [Fluphenazine]     Ineffective    Benztropine Other (See Comments)    Hair fall out  Suicide thoughts   Benztropine Mesylate Other (See Comments)   Buprenorphine Hcl Other (See Comments)    Unable to void   Carbidopa-Levodopa Nausea Only   Codeine Hives   Cymbalta [Duloxetine Hcl] Other (See Comments)    Altered mental status, Alopecia, visual hallucinations, nightmares   Gabapentin Swelling   Lyrica [Pregabalin] Other (See Comments)    Alopecia, visual hallucinations, nightmares Altered mental status   Morphine And Related Other (See Comments)    Unable to void   Nortripytline Hcl [Nortriptyline] Other (See Comments)    Hair loss and night mares    Nsaids  REACTION: palpitations, diaphoresis   Penicillins Nausea And Vomiting and Other (See Comments)    REACTION: upset stomach Has patient had a PCN reaction causing immediate rash, facial/tongue/throat swelling, SOB or lightheadedness with hypotension: No Has patient had a PCN reaction causing severe rash involving mucus membranes or skin necrosis: No Has patient had a PCN reaction that required hospitalization: No Has patient had a PCN reaction occurring within the last 10 years: No If all of the above answers are "NO", then may proceed with Cephalosporin use.   Wellbutrin [Bupropion]     Constipation, mood swings   Tolmetin Other (See Comments) and Palpitations    REACTION: palpitations, diaphoresis    Metabolic Disorder Labs: Lab Results  Component Value Date   HGBA1C 6.2 (H) 03/06/2017   MPG 131.24 03/06/2017   MPG 108 05/17/2015   Lab Results  Component Value Date   PROLACTIN 4.5 (L) 06/13/2020   PROLACTIN 64.2 (H) 03/06/2017   Lab Results  Component Value Date   CHOL 146 08/22/2018   TRIG 172 (H) 08/22/2018   HDL 49 08/22/2018   CHOLHDL 3.0 08/22/2018   VLDL 34 08/22/2018   LDLCALC 63 08/22/2018   LDLCALC 59 03/06/2017   Lab  Results  Component Value Date   TSH 0.959 08/22/2018   TSH 2.766 08/24/2017    Therapeutic Level Labs: No results found for: LITHIUM No results found for: VALPROATE No components found for:  CBMZ  Current Medications: Current Outpatient Medications  Medication Sig Dispense Refill   ACCU-CHEK GUIDE test strip      acetaminophen (TYLENOL) 500 MG tablet Take 1,000 mg by mouth every 6 (six) hours as needed for moderate pain or headache.      albuterol (PROVENTIL HFA;VENTOLIN HFA) 108 (90 Base) MCG/ACT inhaler Inhale 2 puffs into the lungs every 6 (six) hours as needed for wheezing or shortness of breath.     Alcohol Swabs (B-D SINGLE USE SWABS REGULAR) PADS      amitriptyline (ELAVIL) 25 MG tablet Take 1 tablet (25 mg total) by mouth at bedtime. 90 tablet 0   aspirin EC 81 MG tablet Take 81 mg by mouth daily. Swallow whole.     Blood Glucose Calibration (ACCU-CHEK AVIVA) SOLN      Blood Glucose Monitoring Suppl (ACCU-CHEK AVIVA PLUS) w/Device KIT      budesonide-formoterol (SYMBICORT) 160-4.5 MCG/ACT inhaler Inhale into the lungs.     butalbital-acetaminophen-caffeine (FIORICET) 50-325-40 MG tablet      carbidopa-levodopa (SINEMET CR) 50-200 MG tablet Take 1 tablet by mouth at bedtime.     carbidopa-levodopa (SINEMET IR) 25-100 MG tablet Take by mouth.     Cholecalciferol (VITAMIN D-3) 125 MCG (5000 UT) TABS Take 5,000 Units by mouth daily.     clindamycin (CLEOCIN) 300 MG capsule Take 600 mg by mouth See admin instructions. Take 600 mg 1 hour prior to dental work     Cyanocobalamin (B-12 COMPLIANCE INJECTION IJ) Inject 1 Dose as directed every 30 (thirty) days.     cyclobenzaprine (FLEXERIL) 5 MG tablet Take 5 mg by mouth 2 (two) times daily.     diphenhydrAMINE (BENADRYL) 25 MG tablet Take 25 mg by mouth daily as needed for allergies.     docusate sodium (COLACE) 50 MG capsule Take 100 mg by mouth 2 (two) times daily.     insulin NPH-regular Human (NOVOLIN 70/30) (70-30) 100 UNIT/ML  injection Inject 15 units subcutaneously twice daily with meals     levothyroxine (SYNTHROID) 50 MCG tablet Take  by mouth.     metFORMIN (GLUCOPHAGE) 500 MG tablet Take by mouth.     montelukast (SINGULAIR) 10 MG tablet TAKE 1 TABLET EVERY NIGHT     OLANZapine (ZYPREXA) 2.5 MG tablet Take 1 tablet (2.5 mg total) by mouth at bedtime. 90 tablet 0   ondansetron (ZOFRAN) 4 MG tablet Take 4 mg by mouth every 8 (eight) hours as needed for nausea or vomiting.     ondansetron (ZOFRAN-ODT) 4 MG disintegrating tablet      pravastatin (PRAVACHOL) 40 MG tablet Take by mouth.     RELION INSULIN SYRINGE 31G X 15/64" 1 ML MISC      rizatriptan (MAXALT) 10 MG tablet Take 10 mg by mouth 2 (two) times daily as needed for migraine. May repeat in 2 hours if needed     rOPINIRole (REQUIP) 0.25 MG tablet Take by mouth.     traMADol (ULTRAM) 50 MG tablet 2 tablet as needed     FLUoxetine (PROZAC) 40 MG capsule Take 1 capsule (40 mg total) by mouth daily. Dose reduction 180 capsule 0   verapamil (CALAN-SR) 120 MG CR tablet Take by mouth.     No current facility-administered medications for this visit.     Musculoskeletal: Strength & Muscle Tone: within normal limits Gait & Station:  walks with cane Patient leans: N/A  Psychiatric Specialty Exam: Review of Systems  Neurological:  Positive for tremors and headaches.  Psychiatric/Behavioral:  Positive for hallucinations and sleep disturbance. The patient is nervous/anxious.   All other systems reviewed and are negative.  Blood pressure (!) 150/90, pulse 68, height _0  (1.626 m), weight 257 lb (116.6 kg), SpO2 94 %.Body mass index is 44.11 kg/m.  General Appearance: Casual  Eye Contact:  Good  Speech:  Clear and Coherent  Volume:  Normal  Mood:  Anxious  Affect:  Appropriate  Thought Process:  Goal Directed and Descriptions of Associations: Intact  Orientation:  Full (Time, Place, and Person)  Thought Content: Hallucinations: Auditory ,chronic, able to  cope  Suicidal Thoughts:  No  Homicidal Thoughts:  No  Memory:  Immediate;   Fair Recent;   Fair Remote;   limited  Judgement:  Fair  Insight:  Fair  Psychomotor Activity:  Normal  Concentration:  Concentration: Fair and Attention Span: Fair  Recall:  [employer] of Knowledge: Fair  Language: Fair  Akathisia:  No  Handed:  Right  AIMS (if indicated):  done  Assets:  Communication Skills Desire for Improvement Housing Social Support  ADL's:  Intact  Cognition: WNL  Sleep:  Poor   Screenings: [place] Office Visit from 12/08/2020 in [facility] Visit from 11/13/2020 in [place] Visit from 06/13/2020 in [place] Visit from 04/08/2020 in [place] Admission (Discharged) from [facility] from 08/30/2018 in [facility] Total Score _1 0      AUDIT    Flowsheet Row Admission (Discharged) from OP Visit from 08/30/2018 in [place] Admission (Discharged) from 08/22/2018 in [place] [facility] Admission (Discharged) from 03/03/2017 in [place] [facility]  Alcohol Use Disorder Identification Test Final Score (AUDIT) 0 0 0      GAD-7    Flowsheet Row Office Visit from 11/13/2020 in [place] Office Visit from 10/14/2020 in [place] Visit from 08/19/2020 in  [place] Office Visit from 08/22/2015 in [place] at [facility]  Total GAD-7 Score _0 PHQ2-9    [place] Visit from 12/08/2020 in [place] Office Visit from 11/13/2020 in [place] Office Visit from 08/19/2020 in [place] Office Visit from 06/13/2020 in  [place] Office Visit from 04/08/2020 in [place]  PHQ-2 Total Score 2 0 0 1 0  PHQ-9 Total Score 6 -- -- -- --      [place] Visit from 06/13/2020 in [place] Office Visit from 04/08/2020 in [place] Video Visit from 03/26/2020 in [place] Error: Question 6 not populated No Risk No Risk        Assessment and Plan: KARLEIGH BUNTE is a 60 year old Caucasian female who has a history of schizoaffective disorder, PTSD, migraine headache, drug induced Parkinson's disease was evaluated in office today.  Patient struggles with headaches, sleep problems and will benefit from the following plan.  Plan Schizoaffective disorder-improving Zyprexa 2.5 mg p.o. nightly.   GAD-improving Reduce fluoxetine to 40 mg p.o. daily Increase Elavil to 25 mg p.o. nightly-recently added for headache per primary care provider. Discussed drug to drug interaction including serotonin syndrome.  PTSD-stable Fluoxetine 40 mg p.o. daily-reduced dosage  Drug induced Parkinson's disease-improving Currently on Requip and carbidopa levodopa per primary care provider/neurology  Elevated blood pressure reading-unstable Patient to monitor blood pressure and follow up with primary care provider.  Provided education, discussed crisis plan, provided information for [facility].  Follow-up in clinic in 3 weeks or sooner in person.  This note was generated in part or whole with voice recognition software. Voice recognition is usually quite accurate but there are transcription errors that can and very often do occur. I apologize for any typographical errors that were not detected and corrected.     Ursula Alert, MD 12/08/2020, 12:52 PM

## 2020-12-08 NOTE — Patient Instructions (Signed)
[facility]  [place], [place] [address]  Hours:  Open 24/7, No appointment required  Phone:  [phone]

## 2020-12-17 DIAGNOSIS — N1831 Chronic kidney disease, stage 3a: Secondary | ICD-10-CM | POA: Diagnosis not present

## 2020-12-17 DIAGNOSIS — E1122 Type 2 diabetes mellitus with diabetic chronic kidney disease: Secondary | ICD-10-CM | POA: Diagnosis not present

## 2020-12-17 DIAGNOSIS — Z794 Long term (current) use of insulin: Secondary | ICD-10-CM | POA: Diagnosis not present

## 2020-12-17 DIAGNOSIS — I10 Essential (primary) hypertension: Secondary | ICD-10-CM | POA: Diagnosis not present

## 2020-12-29 ENCOUNTER — Encounter: Payer: Self-pay | Admitting: Psychiatry

## 2020-12-29 ENCOUNTER — Ambulatory Visit (INDEPENDENT_AMBULATORY_CARE_PROVIDER_SITE_OTHER): Payer: Medicare HMO | Admitting: Psychiatry

## 2020-12-29 ENCOUNTER — Other Ambulatory Visit: Payer: Self-pay

## 2020-12-29 VITALS — BP 119/75 | HR 111 | Temp 97.8°F | Wt 253.6 lb

## 2020-12-29 DIAGNOSIS — F251 Schizoaffective disorder, depressive type: Secondary | ICD-10-CM | POA: Diagnosis not present

## 2020-12-29 DIAGNOSIS — G2119 Other drug induced secondary parkinsonism: Secondary | ICD-10-CM | POA: Diagnosis not present

## 2020-12-29 DIAGNOSIS — F411 Generalized anxiety disorder: Secondary | ICD-10-CM | POA: Diagnosis not present

## 2020-12-29 DIAGNOSIS — G4701 Insomnia due to medical condition: Secondary | ICD-10-CM

## 2020-12-29 DIAGNOSIS — F4310 Post-traumatic stress disorder, unspecified: Secondary | ICD-10-CM

## 2020-12-29 NOTE — Progress Notes (Signed)
[facility] OP Progress Note  12/29/2020 10:03 AM Heather Haley  MRN:  [id number]  Chief Complaint:  Chief Complaint   Follow-up; Anxiety; Insomnia    HPI: Heather Haley is a 60 year old Caucasian female, lives in [place], has a history of schizoaffective disorder, PTSD, GAD, drug induced Parkinson's disease, insomnia, headache, memory problems, diabetes mellitus, chronic pain was evaluated in office today.  Patient reports her anxiety has improved since being on the Elavil.  The Elavil also has been helpful with her headaches.  She is currently on a lower dose of Prozac and tolerating it well.  Patient continues to have tremors, chronic however it is improving.  Patient reports her auditory hallucination has improved.  They are quieter.  She continues to be on the olanzapine and reports she takes a 5 mg at bedtime.  Patient with back pain, recently started on buprenorphine patch.  She has upcoming appointment for follow-up.  Patient denies any suicidality, homicidality or perceptual disturbances.  Patient denies any other concerns today.  Visit Diagnosis:    ICD-10-CM   1. Schizoaffective disorder, depressive type ([place])  F25.1     2. PTSD (post-traumatic stress disorder)  F43.10     3. Generalized anxiety disorder  F41.1     4. Drug-induced Parkinson's disease ([place])  G21.19     5. Insomnia due to medical condition  G47.01    pain, mood      Past Psychiatric History: Reviewed past psychiatric history from progress note on 05/25/2017.  Past Medical History:  Past Medical History:  Diagnosis Date   Anxiety    Asthma    Chronic kidney disease    Chronic pain    previously saw Dr. Consuela Mimes in pain clinic, then saw pain specialist in [facility]   Depression    Diabetes mellitus ([place])    Frequency of urination    GERD (gastroesophageal reflux disease)    Headache(784.0)    High cholesterol    History of kidney stones    Hypertension    IBS (irritable bowel  syndrome)    Left ankle instability    Left knee DJD    Lumbar Degenerative Disc Disease of  10/11/2014   Neuromuscular disorder (HCC)    Osteoarthritis of hip (Right) 05/05/2015   Other enthesopathy of ankle and tarsus 12/15/2009   Qualifier: Diagnosis of  By: Oneida Alar MD, KARL     Parkinson's disease ([place])    Peripheral sensory neuropathy (Bilateral) 11/19/2014   Postoperative nausea and vomiting    Schizophrenia ([place])     Past Surgical History:  Procedure Laterality Date   ABDOMINAL HYSTERECTOMY     ANKLE SURGERY Left    x 2   APPENDECTOMY     CARDIAC CATHETERIZATION     COLONOSCOPY  2013   COLONOSCOPY WITH PROPOFOL N/A 04/25/2017   Procedure: COLONOSCOPY WITH PROPOFOL;  Surgeon: Manya Silvas, MD;  Location: [facility] ENDOSCOPY;  Service: Endoscopy;  Laterality: N/A;   CYSTOSCOPY/URETEROSCOPY/HOLMIUM LASER/STENT PLACEMENT Left 09/11/2019   Procedure: CYSTOSCOPY/URETEROSCOPY/HOLMIUM LASER/STENT PLACEMENT;  Surgeon: Abbie Sons, MD;  Location: [facility] ORS;  Service: Urology;  Laterality: Left;   ESOPHAGOGASTRODUODENOSCOPY (EGD) WITH PROPOFOL N/A 10/03/2014   Procedure: ESOPHAGOGASTRODUODENOSCOPY (EGD) WITH PROPOFOL;  Surgeon: Josefine Class, MD;  Location: [facility] ENDOSCOPY;  Service: Endoscopy;  Laterality: N/A;   ESOPHAGOGASTRODUODENOSCOPY (EGD) WITH PROPOFOL  04/25/2017   Procedure: ESOPHAGOGASTRODUODENOSCOPY (EGD) WITH PROPOFOL;  Surgeon: Manya Silvas, MD;  Location: [facility] ENDOSCOPY;  Service: Endoscopy;;   JOINT REPLACEMENT Left  knee   KNEE ARTHROSCOPY  1997   left knee   LEFT HEART CATH AND CORONARY ANGIOGRAPHY Left 11/10/2017   Procedure: LEFT HEART CATH AND CORONARY ANGIOGRAPHY;  Surgeon: Isaias Cowman, MD;  Location: [facility] CV LAB;  Service: Cardiovascular;  Laterality: Left;   LEFT HEART CATHETERIZATION WITH CORONARY ANGIOGRAM N/A 01/11/2013   Procedure: LEFT HEART CATHETERIZATION WITH CORONARY ANGIOGRAM;  Surgeon: Sinclair Grooms, MD;  Location: [facility] CATH  LAB;  Service: Cardiovascular;  Laterality: N/A;   TOTAL KNEE ARTHROPLASTY  08/30/2011   Procedure: TOTAL KNEE ARTHROPLASTY;  Surgeon: Lorn Junes, MD;  Location: [place];  Service: Orthopedics;  Laterality: Left;    Family Psychiatric History: Reviewed family psychiatric history from progress note on 05/25/2017.    Family History:  Family History  Problem Relation Age of Onset   Cancer Mother    Hypertension Father    Heart disease Father    Alcohol abuse Father    Breast cancer Neg Hx     Social History: Reviewed social history from progress note on 05/25/2017. Social History   Socioeconomic History   Marital status: Divorced    Spouse name: Not on file   Number of children: 2   Years of education: 11   Highest education level: 11th grade  Occupational History    Employer: [employer]  Tobacco Use   Smoking status: Former    Packs/day: 2.00    Years: 15.00    Pack years: 30.00    Types: Cigarettes    Quit date: 02/09/1995    Years since quitting: 25.9   Smokeless tobacco: Never  Vaping Use   Vaping Use: Never used  Substance and Sexual Activity   Alcohol use: No    Alcohol/week: 0.0 standard drinks   Drug use: No   Sexual activity: Not Currently  Other Topics Concern   Not on file  Social History Narrative   Patient lives at home alone. Patient works at [employer].   Caffeine daily- 2   Right handed.   Education- 11 th grade   Social Determinants of Radio broadcast assistant Strain: Not on file  Food Insecurity: Not on file  Transportation Needs: Not on file  Physical Activity: Not on file  Stress: Not on file  Social Connections: Not on file    Allergies:  Allergies  Allergen Reactions   Prolixin Decanoate [Fluphenazine]     Ineffective    Benztropine Other (See Comments)    Hair fall out  Suicide thoughts   Benztropine Mesylate Other (See Comments)   Buprenorphine Hcl Other (See Comments)    Unable to void   Carbidopa-Levodopa Nausea  Only   Codeine Hives   Cymbalta [Duloxetine Hcl] Other (See Comments)    Altered mental status, Alopecia, visual hallucinations, nightmares   Gabapentin Swelling   Lyrica [Pregabalin] Other (See Comments)    Alopecia, visual hallucinations, nightmares Altered mental status   Morphine And Related Other (See Comments)    Unable to void   Nortripytline Hcl [Nortriptyline] Other (See Comments)    Hair loss and night mares    Nsaids     REACTION: palpitations, diaphoresis   Penicillins Nausea And Vomiting and Other (See Comments)    REACTION: upset stomach Has patient had a PCN reaction causing immediate rash, facial/tongue/throat swelling, SOB or lightheadedness with hypotension: No Has patient had a PCN reaction causing severe rash involving mucus membranes or skin necrosis: No Has patient had a PCN reaction that  required hospitalization: No Has patient had a PCN reaction occurring within the last 10 years: No If all of the above answers are "NO", then may proceed with Cephalosporin use.   Wellbutrin [Bupropion]     Constipation, mood swings   Tolmetin Other (See Comments) and Palpitations    REACTION: palpitations, diaphoresis    Metabolic Disorder Labs: Lab Results  Component Value Date   HGBA1C 6.2 (H) 03/06/2017   MPG 131.24 03/06/2017   MPG 108 05/17/2015   Lab Results  Component Value Date   PROLACTIN 4.5 (L) 06/13/2020   PROLACTIN 64.2 (H) 03/06/2017   Lab Results  Component Value Date   CHOL 146 08/22/2018   TRIG 172 (H) 08/22/2018   HDL 49 08/22/2018   CHOLHDL 3.0 08/22/2018   VLDL 34 08/22/2018   LDLCALC 63 08/22/2018   LDLCALC 59 03/06/2017   Lab Results  Component Value Date   TSH 0.959 08/22/2018   TSH 2.766 08/24/2017    Therapeutic Level Labs: No results found for: LITHIUM No results found for: VALPROATE No components found for:  CBMZ  Current Medications: Current Outpatient Medications  Medication Sig Dispense Refill   ACCU-CHEK GUIDE test  strip      acetaminophen (TYLENOL) 500 MG tablet Take 1,000 mg by mouth every 6 (six) hours as needed for moderate pain or headache.      albuterol (PROVENTIL HFA;VENTOLIN HFA) 108 (90 Base) MCG/ACT inhaler Inhale 2 puffs into the lungs every 6 (six) hours as needed for wheezing or shortness of breath.     Alcohol Swabs (B-D SINGLE USE SWABS REGULAR) PADS      amitriptyline (ELAVIL) 25 MG tablet Take 1 tablet (25 mg total) by mouth at bedtime. 90 tablet 0   aspirin EC 81 MG tablet Take 81 mg by mouth daily. Swallow whole.     Blood Glucose Calibration (ACCU-CHEK AVIVA) SOLN      Blood Glucose Monitoring Suppl (ACCU-CHEK AVIVA PLUS) w/Device KIT      buprenorphine (BUTRANS) 10 MCG/HR PTWK Place onto the skin once a week.     butalbital-acetaminophen-caffeine (FIORICET) 50-325-40 MG tablet      carbidopa-levodopa (SINEMET CR) 50-200 MG tablet Take 1 tablet by mouth at bedtime.     carbidopa-levodopa (SINEMET IR) 25-100 MG tablet Take by mouth.     Cholecalciferol (VITAMIN D-3) 125 MCG (5000 UT) TABS Take 5,000 Units by mouth daily.     docusate sodium (COLACE) 50 MG capsule Take 100 mg by mouth 2 (two) times daily.     FLUoxetine (PROZAC) 40 MG capsule Take 1 capsule (40 mg total) by mouth daily. Dose reduction 180 capsule 0   insulin NPH-regular Human (NOVOLIN 70/30) (70-30) 100 UNIT/ML injection Inject 15 units subcutaneously twice daily with meals     levothyroxine (SYNTHROID) 50 MCG tablet Take by mouth.     metFORMIN (GLUCOPHAGE) 500 MG tablet Take by mouth.     OLANZapine (ZYPREXA) 5 MG tablet Take 1 tablet by mouth daily.     pravastatin (PRAVACHOL) 40 MG tablet Take by mouth.     RELION INSULIN SYRINGE 31G X 15/64" 1 ML MISC      rizatriptan (MAXALT) 10 MG tablet Take 10 mg by mouth 2 (two) times daily as needed for migraine. May repeat in 2 hours if needed     rOPINIRole (REQUIP) 0.25 MG tablet Take by mouth.     No current facility-administered medications for this visit.      Musculoskeletal: Strength & Muscle Tone:  wnl Gait & Station:  walks with cane Patient leans: N/A  Psychiatric Specialty Exam: Review of Systems  Musculoskeletal:  Positive for back pain.  Neurological:  Positive for tremors.  Psychiatric/Behavioral:  The patient is nervous/anxious.   All other systems reviewed and are negative.  Blood pressure 119/75, pulse (!) 111, temperature 97.8 F (36.6 C), temperature source Temporal, weight 253 lb 9.6 oz (115 kg).Body mass index is 43.53 kg/m.  General Appearance: Casual  Eye Contact:  Fair  Speech:  Clear and Coherent  Volume:  Normal  Mood:  Anxious improving  Affect:  Congruent  Thought Process:  Goal Directed and Descriptions of Associations: Intact  Orientation:  Full (Time, Place, and Person)  Thought Content: Logical   Suicidal Thoughts:  No  Homicidal Thoughts:  No  Memory:  Immediate;   Fair Recent;   Fair Remote;   Fair  Judgement:  Fair  Insight:  Fair  Psychomotor Activity:  Tremor  Concentration:  Concentration: Fair and Attention Span: Fair  Recall:  Heather Haley of Knowledge: Fair  Language: Fair  Akathisia:  No  Handed:  Right  AIMS (if indicated): done  Assets:  Communication Skills Desire for Improvement Housing Social Support  ADL's:  Intact  Cognition: WNL  Sleep:   improving   Screenings: [place] Office Visit from 12/29/2020 in [place] Office Visit from 12/08/2020 in [place] Visit from 11/13/2020 in [place] Visit from 06/13/2020 in [place] Visit from 04/08/2020 in [place] Total Score '4 4 7 4 10      ' AUDIT    [place] Admission (Discharged) from OP Visit from 08/30/2018 in [place] Admission (Discharged) from 08/22/2018 in [place] [facility]  Admission (Discharged) from 03/03/2017 in [place] [facility]  Alcohol Use Disorder Identification Test Final Score (AUDIT) 0 0 0      GAD-7    Flowsheet Row Office Visit from 11/13/2020 in [place] Office Visit from 10/14/2020 in [place] Visit from 08/19/2020 in [place] Visit from 08/22/2015 in [place] at [facility]  Total GAD-7 Score '3 5 5 6      ' PHQ2-9    [place] Office Visit from 12/29/2020 in [place] Visit from 12/08/2020 in [place] Visit from 11/13/2020 in [place] Visit from 08/19/2020 in [place] Office Visit from 06/13/2020 in [place]  PHQ-2 Total Score 1 2 0 0 1  PHQ-9 Total Score 7 6 -- -- --      [place] Visit from 12/29/2020 in [place] Visit from 06/13/2020 in [place] Visit from 04/08/2020 in [place] No Risk Error: Question 6 not populated No Risk        Assessment and Plan: Heather Haley is a 60 year old Caucasian female who has a history of schizoaffective disorder, PTSD, migraine headaches, drug induced Parkinson's disease was evaluated in office today.  Patient is currently improving on the current medication regimen however does report pain and has upcoming appointment with her primary provider, currently on buprenorphine patch.  Plan Schizoaffective disorder-stable Zyprexa 5 mg p.o. nightly.  GAD-improving Fluoxetine 40 mg p.o. daily Elavil at  increased dosage of 25 mg p.o. nightly-recently added for headache per primary care provider.  PTSD-stable Fluoxetine 40 mg p.o. daily  Drug  induced Parkinson's disease-improving Currently on Requip and carbidopa levodopa per primary care provider/neurology  Patient to continue to follow up with pain provider for her back pain.  Follow-up in clinic in 1 month or sooner in person.  This note was generated in part or whole with voice recognition software. Voice recognition is usually quite accurate but there are transcription errors that can and very often do occur. I apologize for any typographical errors that were not detected and corrected.     Ursula Alert, MD 12/29/2020, 10:03 AM

## 2021-01-05 ENCOUNTER — Ambulatory Visit: Payer: Medicare HMO | Admitting: Psychiatry

## 2021-01-28 ENCOUNTER — Ambulatory Visit (INDEPENDENT_AMBULATORY_CARE_PROVIDER_SITE_OTHER): Payer: Medicare HMO | Admitting: Psychiatry

## 2021-01-28 ENCOUNTER — Encounter: Payer: Self-pay | Admitting: Psychiatry

## 2021-01-28 ENCOUNTER — Other Ambulatory Visit: Payer: Self-pay

## 2021-01-28 VITALS — BP 137/77 | HR 100 | Temp 98.1°F | Ht 64.0 in | Wt 251.6 lb

## 2021-01-28 DIAGNOSIS — R030 Elevated blood-pressure reading, without diagnosis of hypertension: Secondary | ICD-10-CM | POA: Diagnosis not present

## 2021-01-28 DIAGNOSIS — F411 Generalized anxiety disorder: Secondary | ICD-10-CM

## 2021-01-28 DIAGNOSIS — F251 Schizoaffective disorder, depressive type: Secondary | ICD-10-CM

## 2021-01-28 DIAGNOSIS — G2119 Other drug induced secondary parkinsonism: Secondary | ICD-10-CM | POA: Diagnosis not present

## 2021-01-28 DIAGNOSIS — G4701 Insomnia due to medical condition: Secondary | ICD-10-CM | POA: Diagnosis not present

## 2021-01-28 DIAGNOSIS — F4310 Post-traumatic stress disorder, unspecified: Secondary | ICD-10-CM | POA: Diagnosis not present

## 2021-01-28 MED ORDER — AMITRIPTYLINE HCL 50 MG PO TABS: 50.0000 mg | ORAL_TABLET | Freq: Every day | ORAL | 0 refills | Status: DC

## 2021-01-28 MED ORDER — FLUOXETINE HCL 10 MG PO CAPS
10.0000 mg | ORAL_CAPSULE | Freq: Every day | ORAL | 0 refills | Status: DC
Start: 2021-01-28 — End: 2021-02-25

## 2021-01-28 NOTE — Progress Notes (Signed)
[facility] MD OP Progress Note  01/28/2021 12:57 PM Heather Haley  MRN:  [id number]  Chief Complaint:  Chief Complaint   Follow-up; Depression; Hallucinations    HPI: Heather Haley is a 60 year old Caucasian female, lives in [place] has a history of schizoaffective disorder, PTSD, GAD, drug induced Parkinson's disease, insomnia, headache, memory problems, diabetes mellitus, chronic pain was evaluated in office today.  Patient today reports she continues to have sleep problems.  The past week she had sleep problems at least 3 nights.  She reports she has difficulty falling asleep as well as maintaining sleep.  Patient reports otherwise depressive symptoms are stable.  The holidays does make her anxious and she is trying to stay away from toxic people as much as possible.  She is planning to spend Christmas with her daughter.  She continues to have hallucinations however they are chronic and manageable.  It does not bother her.  Patient is compliant on her medications.  Denies side effects.  Patient reports her tremors are improved since being on the current combination of medications per neurology.  She continues to have headaches, although she was prescribed Aimovig recently ,has been unable to afford it.  Patient denies any suicidality, homicidality .  Patient denies any other concerns today.  Visit Diagnosis:    ICD-10-CM   1. Schizoaffective disorder, depressive type ([place])  F25.1 FLUoxetine (PROZAC) 10 MG capsule    2. PTSD (post-traumatic stress disorder)  F43.10 FLUoxetine (PROZAC) 10 MG capsule    amitriptyline (ELAVIL) 50 MG tablet    3. Generalized anxiety disorder  F41.1 FLUoxetine (PROZAC) 10 MG capsule    amitriptyline (ELAVIL) 50 MG tablet    4. Drug-induced Parkinson's disease ([place])  G21.19     5. Insomnia due to medical condition  G47.01    mood, pain    6. Elevated blood pressure reading  R03.0       Past Psychiatric History: Reviewed past  psychiatric history from progress note on 05/25/2017  Past Medical History:  Past Medical History:  Diagnosis Date   Anxiety    Asthma    Chronic kidney disease    Chronic pain    previously saw Dr. Consuela Mimes in pain clinic, then saw pain specialist in [facility]   Depression    Diabetes mellitus ([place])    Frequency of urination    GERD (gastroesophageal reflux disease)    Headache(784.0)    High cholesterol    History of kidney stones    Hypertension    IBS (irritable bowel syndrome)    Left ankle instability    Left knee DJD    Lumbar Degenerative Disc Disease of  10/11/2014   Neuromuscular disorder ([place])    Osteoarthritis of hip (Right) 05/05/2015   Other enthesopathy of ankle and tarsus 12/15/2009   Qualifier: Diagnosis of  By: Oneida Alar MD, KARL     Parkinson's disease ([place])    Peripheral sensory neuropathy (Bilateral) 11/19/2014   Postoperative nausea and vomiting    Schizophrenia ([place])     Past Surgical History:  Procedure Laterality Date   ABDOMINAL HYSTERECTOMY     ANKLE SURGERY Left    x 2   APPENDECTOMY     CARDIAC CATHETERIZATION     COLONOSCOPY  2013   COLONOSCOPY WITH PROPOFOL N/A 04/25/2017   Procedure: COLONOSCOPY WITH PROPOFOL;  Surgeon: Manya Silvas, MD;  Location: [facility] ENDOSCOPY;  Service: Endoscopy;  Laterality: N/A;   CYSTOSCOPY/URETEROSCOPY/HOLMIUM LASER/STENT PLACEMENT Left 09/11/2019   Procedure: CYSTOSCOPY/URETEROSCOPY/HOLMIUM LASER/STENT  PLACEMENT;  Surgeon: Abbie Sons, MD;  Location: [facility] ORS;  Service: Urology;  Laterality: Left;   ESOPHAGOGASTRODUODENOSCOPY (EGD) WITH PROPOFOL N/A 10/03/2014   Procedure: ESOPHAGOGASTRODUODENOSCOPY (EGD) WITH PROPOFOL;  Surgeon: Josefine Class, MD;  Location: [facility] ENDOSCOPY;  Service: Endoscopy;  Laterality: N/A;   ESOPHAGOGASTRODUODENOSCOPY (EGD) WITH PROPOFOL  04/25/2017   Procedure: ESOPHAGOGASTRODUODENOSCOPY (EGD) WITH PROPOFOL;  Surgeon: Manya Silvas, MD;  Location: [facility] ENDOSCOPY;  Service:  Endoscopy;;   JOINT REPLACEMENT Left    knee   KNEE ARTHROSCOPY  1997   left knee   LEFT HEART CATH AND CORONARY ANGIOGRAPHY Left 11/10/2017   Procedure: LEFT HEART CATH AND CORONARY ANGIOGRAPHY;  Surgeon: Isaias Cowman, MD;  Location: [place] CV LAB;  Service: Cardiovascular;  Laterality: Left;   LEFT HEART CATHETERIZATION WITH CORONARY ANGIOGRAM N/A 01/11/2013   Procedure: LEFT HEART CATHETERIZATION WITH CORONARY ANGIOGRAM;  Surgeon: Sinclair Grooms, MD;  Location: [facility] CATH LAB;  Service: Cardiovascular;  Laterality: N/A;   TOTAL KNEE ARTHROPLASTY  08/30/2011   Procedure: TOTAL KNEE ARTHROPLASTY;  Surgeon: Lorn Junes, MD;  Location: [place];  Service: Orthopedics;  Laterality: Left;    Family Psychiatric History: Reviewed family psychiatric history from progress note on 05/25/2017  Family History:  Family History  Problem Relation Age of Onset   Cancer Mother    Hypertension Father    Heart disease Father    Alcohol abuse Father    Breast cancer Neg Hx     Social History: Reviewed social history from progress note on 05/25/2017 Social History   Socioeconomic History   Marital status: Divorced    Spouse name: Not on file   Number of children: 2   Years of education: 11   Highest education level: 11th grade  Occupational History    Employer: [employer]  Tobacco Use   Smoking status: Former    Packs/day: 2.00    Years: 15.00    Pack years: 30.00    Types: Cigarettes    Quit date: 02/09/1995    Years since quitting: 25.9   Smokeless tobacco: Never  Vaping Use   Vaping Use: Never used  Substance and Sexual Activity   Alcohol use: No    Alcohol/week: 0.0 standard drinks   Drug use: No   Sexual activity: Not Currently  Other Topics Concern   Not on file  Social History Narrative   Patient lives at home alone. Patient works at [employer].   Caffeine daily- 2   Right handed.   Education- 11 th grade   Social Determinants of Systems developer Strain: Not on file  Food Insecurity: Not on file  Transportation Needs: Not on file  Physical Activity: Not on file  Stress: Not on file  Social Connections: Not on file    Allergies:  Allergies  Allergen Reactions   Prolixin Decanoate [Fluphenazine]     Ineffective    Benztropine Other (See Comments)    Hair fall out  Suicide thoughts   Benztropine Mesylate Other (See Comments)   Buprenorphine Hcl Other (See Comments)    Unable to void   Carbidopa-Levodopa Nausea Only   Codeine Hives   Cymbalta [Duloxetine Hcl] Other (See Comments)    Altered mental status, Alopecia, visual hallucinations, nightmares   Gabapentin Swelling   Lyrica [Pregabalin] Other (See Comments)    Alopecia, visual hallucinations, nightmares Altered mental status   Morphine And Related Other (See Comments)    Unable to void  Nortripytline Hcl [Nortriptyline] Other (See Comments)    Hair loss and night mares    Nsaids     REACTION: palpitations, diaphoresis   Penicillins Nausea And Vomiting and Other (See Comments)    REACTION: upset stomach Has patient had a PCN reaction causing immediate rash, facial/tongue/throat swelling, SOB or lightheadedness with hypotension: No Has patient had a PCN reaction causing severe rash involving mucus membranes or skin necrosis: No Has patient had a PCN reaction that required hospitalization: No Has patient had a PCN reaction occurring within the last 10 years: No If all of the above answers are "NO", then may proceed with Cephalosporin use.   Wellbutrin [Bupropion]     Constipation, mood swings   Tolmetin Other (See Comments) and Palpitations    REACTION: palpitations, diaphoresis    Metabolic Disorder Labs: Lab Results  Component Value Date   HGBA1C 6.2 (H) 03/06/2017   MPG 131.24 03/06/2017   MPG 108 05/17/2015   Lab Results  Component Value Date   PROLACTIN 4.5 (L) 06/13/2020   PROLACTIN 64.2 (H) 03/06/2017   Lab Results  Component Value  Date   CHOL 146 08/22/2018   TRIG 172 (H) 08/22/2018   HDL 49 08/22/2018   CHOLHDL 3.0 08/22/2018   VLDL 34 08/22/2018   LDLCALC 63 08/22/2018   LDLCALC 59 03/06/2017   Lab Results  Component Value Date   TSH 0.959 08/22/2018   TSH 2.766 08/24/2017    Therapeutic Level Labs: No results found for: LITHIUM No results found for: VALPROATE No components found for:  CBMZ  Current Medications: Current Outpatient Medications  Medication Sig Dispense Refill   acetaminophen (TYLENOL) 500 MG tablet Take 1,000 mg by mouth every 6 (six) hours as needed for moderate pain or headache.      albuterol (PROVENTIL HFA;VENTOLIN HFA) 108 (90 Base) MCG/ACT inhaler Inhale 2 puffs into the lungs every 6 (six) hours as needed for wheezing or shortness of breath.     Alcohol Swabs (B-D SINGLE USE SWABS REGULAR) PADS      amitriptyline (ELAVIL) 50 MG tablet Take 1 tablet (50 mg total) by mouth at bedtime. 90 tablet 0   aspirin EC 81 MG tablet Take 81 mg by mouth daily. Swallow whole.     Blood Glucose Calibration (ACCU-CHEK AVIVA) SOLN      Blood Glucose Monitoring Suppl (ACCU-CHEK AVIVA PLUS) w/Device KIT      buprenorphine (BUTRANS) 10 MCG/HR PTWK Place onto the skin once a week.     carbidopa-levodopa (SINEMET CR) 50-200 MG tablet Take 1 tablet by mouth at bedtime.     carbidopa-levodopa (SINEMET IR) 25-100 MG tablet Take by mouth.     Cholecalciferol (VITAMIN D-3) 125 MCG (5000 UT) TABS Take 5,000 Units by mouth daily.     docusate sodium (COLACE) 50 MG capsule Take 100 mg by mouth 2 (two) times daily.     FLUoxetine (PROZAC) 10 MG capsule Take 1 capsule (10 mg total) by mouth daily. 10 capsule 0   insulin NPH-regular Human (NOVOLIN 70/30) (70-30) 100 UNIT/ML injection Inject 15 units subcutaneously twice daily with meals     levothyroxine (SYNTHROID) 50 MCG tablet Take by mouth.     metFORMIN (GLUCOPHAGE) 500 MG tablet Take by mouth.     OLANZapine (ZYPREXA) 5 MG tablet Take 1 tablet by mouth  daily.     pravastatin (PRAVACHOL) 40 MG tablet Take by mouth.     QULIPTA 60 MG TABS      rizatriptan (MAXALT)  10 MG tablet Take 10 mg by mouth 2 (two) times daily as needed for migraine. May repeat in 2 hours if needed     ACCU-CHEK GUIDE test strip      butalbital-acetaminophen-caffeine (FIORICET) 50-325-40 MG tablet  (Patient not taking: Reported on 01/28/2021)     [place] X 15/64" 1 ML MISC  (Patient not taking: Reported on 01/28/2021)     rOPINIRole (REQUIP) 0.25 MG tablet Take by mouth. (Patient not taking: Reported on 01/28/2021)     No current facility-administered medications for this visit.     Musculoskeletal: Strength & Muscle Tone: within normal limits Gait & Station:  walks with cane Patient leans: N/A  Psychiatric Specialty Exam: Review of Systems  Neurological:  Positive for tremors and headaches.  Psychiatric/Behavioral:  Positive for hallucinations and sleep disturbance. The patient is nervous/anxious.   All other systems reviewed and are negative.  Blood pressure 137/77, pulse 100, temperature 98.1 F (36.7 C), height _0  (1.626 m), weight 251 lb 9.6 oz (114.1 kg), SpO2 95 %.Body mass index is 43.19 kg/m.  General Appearance: Casual  Eye Contact:  Fair  Speech:  Clear and Coherent  Volume:  Normal  Mood:  Anxious  Affect:  Congruent  Thought Process:  Goal Directed and Descriptions of Associations: Intact  Orientation:  Full (Time, Place, and Person)  Thought Content: Hallucinations: Auditory chronic, she is able to manage ok  Suicidal Thoughts:  No  Homicidal Thoughts:  No  Memory:  Immediate;   Fair Recent;   Fair Remote;   Fair  Judgement:  Fair  Insight:  Fair  Psychomotor Activity:  Tremor  Concentration:  Concentration: Fair and Attention Span: Fair  Recall:  [employer] of Knowledge: Fair  Language: Fair  Akathisia:  No  Handed:  Right  AIMS (if indicated): done,3  Assets:  Communication Skills Desire for  Improvement Housing Social Support  ADL's:  Intact  Cognition: WNL  Sleep:  Poor   Screenings: [place] Office Visit from 01/28/2021 in [place] Office Visit from 12/29/2020 in [place] Office Visit from 12/08/2020 in [place] Visit from 11/13/2020 in [place] Visit from 06/13/2020 in [place] Total Score _1 AUDIT    [place] Admission (Discharged) from OP Visit from 08/30/2018 in [place] Admission (Discharged) from 08/22/2018 in [place] [facility] Admission (Discharged) from 03/03/2017 in [place] [facility]  Alcohol Use Disorder Identification Test Final Score (AUDIT) 0 0 0      GAD-7    Flow[place] Office Visit from 11/13/2020 in [place] Office Visit from 10/14/2020 in [place] Visit from 08/19/2020 in [place] Visit from 08/22/2015 in [place] at [facility]  Total GAD-7 Score _2 [employer]    [place] Visit from 01/28/2021 in [place] Visit from 12/29/2020 in [place] Visit from 12/08/2020 in [place] Visit from 11/13/2020 in [place] Visit from 08/19/2020 in [place]  PHQ-2 Total Score _3 0 0  PHQ-9 Total Score _4 -- --      [place] Office Visit from  01/28/2021 in [place] Office Visit from 12/29/2020 in [place] Office Visit from 06/13/2020 in [place] No Risk No Risk Error: Question 6 not populated        Assessment and Plan: JETT KULZER is a 60 year old Caucasian female who has a history of schizoaffective disorder, PTSD, migraine headaches, drug induced Parkinson's disease was evaluated in office today.  Patient continues to struggle with sleep and will benefit from the following plan.  Plan Schizoaffective disorder-stable Zyprexa 5 mg p.o. nightly  GAD-improving Taper of fluoxetine.  She will start fluoxetine 10 mg p.o. daily and stop it in 10 days from now. Increase Elavil to 50 mg p.o. nightly.  PTSD-stable Fluoxetine as prescribed  Insomnia-unstable Increase Elavil to 50 mg p.o. nightly Continued sleep hygiene techniques  Drug induced Parkinson's disease-improving Currently on carbidopa levodopa per primary care provider/neurology.  Elevated blood pressure reading-patient with elevated blood pressure reading in session today.  Patient advised to be compliant on her antihypertensive medications as well as follow-up with primary care provider.  Patient also to continue to follow-up with neurology for her headaches.  Follow-up in clinic in 2 weeks or sooner in person.  This note was generated in part or whole with voice recognition software. Voice recognition is usually quite accurate but there are transcription errors that can and very often do occur. I apologize for any typographical errors that were not detected and corrected.        Ursula Alert, MD 01/28/2021, 12:57 PM

## 2021-02-05 DIAGNOSIS — J209 Acute bronchitis, unspecified: Secondary | ICD-10-CM | POA: Diagnosis not present

## 2021-02-05 DIAGNOSIS — G894 Chronic pain syndrome: Secondary | ICD-10-CM | POA: Diagnosis not present

## 2021-02-05 DIAGNOSIS — M549 Dorsalgia, unspecified: Secondary | ICD-10-CM | POA: Diagnosis not present

## 2021-02-05 DIAGNOSIS — F209 Schizophrenia, unspecified: Secondary | ICD-10-CM | POA: Diagnosis not present

## 2021-02-05 DIAGNOSIS — G2119 Other drug induced secondary parkinsonism: Secondary | ICD-10-CM | POA: Diagnosis not present

## 2021-02-05 DIAGNOSIS — Z794 Long term (current) use of insulin: Secondary | ICD-10-CM | POA: Diagnosis not present

## 2021-02-05 DIAGNOSIS — E1165 Type 2 diabetes mellitus with hyperglycemia: Secondary | ICD-10-CM | POA: Diagnosis not present

## 2021-02-08 ENCOUNTER — Other Ambulatory Visit: Payer: Self-pay | Admitting: Psychiatry

## 2021-02-08 DIAGNOSIS — F251 Schizoaffective disorder, depressive type: Secondary | ICD-10-CM

## 2021-02-11 ENCOUNTER — Ambulatory Visit: Payer: Medicare HMO | Admitting: Psychiatry

## 2021-02-19 ENCOUNTER — Other Ambulatory Visit: Payer: Self-pay | Admitting: Psychiatry

## 2021-02-19 DIAGNOSIS — F251 Schizoaffective disorder, depressive type: Secondary | ICD-10-CM

## 2021-02-25 ENCOUNTER — Ambulatory Visit (INDEPENDENT_AMBULATORY_CARE_PROVIDER_SITE_OTHER): Payer: Medicare HMO | Admitting: Psychiatry

## 2021-02-25 ENCOUNTER — Encounter: Payer: Self-pay | Admitting: Psychiatry

## 2021-02-25 ENCOUNTER — Other Ambulatory Visit: Payer: Self-pay

## 2021-02-25 VITALS — BP 122/75 | HR 87 | Ht 65.0 in | Wt 260.0 lb

## 2021-02-25 DIAGNOSIS — F251 Schizoaffective disorder, depressive type: Secondary | ICD-10-CM

## 2021-02-25 DIAGNOSIS — F411 Generalized anxiety disorder: Secondary | ICD-10-CM

## 2021-02-25 DIAGNOSIS — G2119 Other drug induced secondary parkinsonism: Secondary | ICD-10-CM

## 2021-02-25 DIAGNOSIS — F4310 Post-traumatic stress disorder, unspecified: Secondary | ICD-10-CM | POA: Diagnosis not present

## 2021-02-25 DIAGNOSIS — G4701 Insomnia due to medical condition: Secondary | ICD-10-CM | POA: Diagnosis not present

## 2021-02-25 NOTE — Progress Notes (Signed)
[facility] MD OP Progress Note  02/25/2021 5:53 PM Heather Haley  MRN:  [id number]  Chief Complaint:  Chief Complaint   Follow-up; Anxiety; Depression; Hallucinations, 61 year old Caucasian female presents for a follow-up appointment for medication management.    HPI: Heather Haley is a 61 year old Caucasian female, lives in [place], has a history of schizoaffective disorder, PTSD, GAD, drug induced Parkinson's disease, insomnia, headache, memory problems, diabetes melitis, chronic pain was evaluated in office today.  Patient today reports she is currently doing fairly well on the current medication regimen.  Currently reports she is working on her sleep hygiene and sleep has improved.  When sleep is interrupted she does watch TV.  Discussed sleep hygiene techniques and is agreeable to not using electronic devices like watching TV when she has interrupted sleep.  Patient denies any significant depression or anxiety symptoms at this time.  Had a good holiday season.  Does report hallucinations, auditory, chronic-cannot make out what they say.  Does not bother her.  Currently takes olanzapine 5 mg and reports that has been beneficial.  She does have a history of bilateral hand tremors, on examination today-minimal tremors noted-has improved since last visit.  Reports she did get sick with flulike symptoms however is currently better.  She also had dental work done and continues to need follow-up with her dentist.  Denies any suicidality or homicidality.  Patient reports she does have upcoming labs ordered by primary care provider.  Agrees to also discuss getting thyroid labs since she is due.    Visit Diagnosis:    ICD-10-CM   1. Schizoaffective disorder, depressive type ([place])  F25.1     2. PTSD (post-traumatic stress disorder)  F43.10     3. Generalized anxiety disorder  F41.1     4. Drug-induced Parkinson's disease ([place])  G21.19     5. Insomnia due to medical condition   G47.01    mood , pain      Past Psychiatric History: Reviewed past psychiatric history from progress note on 05/25/2017.  Past Medical History:  Past Medical History:  Diagnosis Date   Anxiety    Asthma    Chronic kidney disease    Chronic pain    previously saw Dr. Consuela Mimes in pain clinic, then saw pain specialist in [facility]   Depression    Diabetes mellitus ([place])    Frequency of urination    GERD (gastroesophageal reflux disease)    Headache(784.0)    High cholesterol    History of kidney stones    Hypertension    IBS (irritable bowel syndrome)    Left ankle instability    Left knee DJD    Lumbar Degenerative Disc Disease of  10/11/2014   Neuromuscular disorder (HCC)    Osteoarthritis of hip (Right) 05/05/2015   Other enthesopathy of ankle and tarsus 12/15/2009   Qualifier: Diagnosis of  By: Oneida Alar MD, KARL     Parkinson's disease ([place])    Peripheral sensory neuropathy (Bilateral) 11/19/2014   Postoperative nausea and vomiting    Schizophrenia ([place])     Past Surgical History:  Procedure Laterality Date   ABDOMINAL HYSTERECTOMY     ANKLE SURGERY Left    x 2   APPENDECTOMY     CARDIAC CATHETERIZATION     COLONOSCOPY  2013   COLONOSCOPY WITH PROPOFOL N/A 04/25/2017   Procedure: COLONOSCOPY WITH PROPOFOL;  Surgeon: Manya Silvas, MD;  Location: [facility] ENDOSCOPY;  Service: Endoscopy;  Laterality: N/A;   CYSTOSCOPY/URETEROSCOPY/HOLMIUM LASER/STENT PLACEMENT  Left 09/11/2019   Procedure: CYSTOSCOPY/URETEROSCOPY/HOLMIUM LASER/STENT PLACEMENT;  Surgeon: Abbie Sons, MD;  Location: [facility] ORS;  Service: Urology;  Laterality: Left;   ESOPHAGOGASTRODUODENOSCOPY (EGD) WITH PROPOFOL N/A 10/03/2014   Procedure: ESOPHAGOGASTRODUODENOSCOPY (EGD) WITH PROPOFOL;  Surgeon: Josefine Class, MD;  Location: [facility] ENDOSCOPY;  Service: Endoscopy;  Laterality: N/A;   ESOPHAGOGASTRODUODENOSCOPY (EGD) WITH PROPOFOL  04/25/2017   Procedure: ESOPHAGOGASTRODUODENOSCOPY (EGD) WITH PROPOFOL;   Surgeon: Manya Silvas, MD;  Location: [facility] ENDOSCOPY;  Service: Endoscopy;;   JOINT REPLACEMENT Left    knee   KNEE ARTHROSCOPY  1997   left knee   LEFT HEART CATH AND CORONARY ANGIOGRAPHY Left 11/10/2017   Procedure: LEFT HEART CATH AND CORONARY ANGIOGRAPHY;  Surgeon: Isaias Cowman, MD;  Location: [place] CV LAB;  Service: Cardiovascular;  Laterality: Left;   LEFT HEART CATHETERIZATION WITH CORONARY ANGIOGRAM N/A 01/11/2013   Procedure: LEFT HEART CATHETERIZATION WITH CORONARY ANGIOGRAM;  Surgeon: Sinclair Grooms, MD;  Location: [facility] CATH LAB;  Service: Cardiovascular;  Laterality: N/A;   TOTAL KNEE ARTHROPLASTY  08/30/2011   Procedure: TOTAL KNEE ARTHROPLASTY;  Surgeon: Lorn Junes, MD;  Location: [place];  Service: Orthopedics;  Laterality: Left;    Family Psychiatric History: Reviewed family psychiatric history from progress note on 05/25/2017.  Family History:  Family History  Problem Relation Age of Onset   Cancer Mother    Hypertension Father    Heart disease Father    Alcohol abuse Father    Breast cancer Neg Hx     Social History: Reviewed social history from progress note on 05/25/2017. Social History   Socioeconomic History   Marital status: Divorced    Spouse name: Not on file   Number of children: 2   Years of education: 11   Highest education level: 11th grade  Occupational History    Employer: [employer]  Tobacco Use   Smoking status: Former    Packs/day: 2.00    Years: 15.00    Pack years: 30.00    Types: Cigarettes    Quit date: 02/09/1995    Years since quitting: 26.0   Smokeless tobacco: Never  Vaping Use   Vaping Use: Never used  Substance and Sexual Activity   Alcohol use: No    Alcohol/week: 0.0 standard drinks   Drug use: No   Sexual activity: Not Currently  Other Topics Concern   Not on file  Social History Narrative   Patient lives at home alone. Patient works at [employer].   Caffeine daily- 2   Right handed.    Education- 11 th grade   Social Determinants of Radio broadcast assistant Strain: Not on file  Food Insecurity: Not on file  Transportation Needs: Not on file  Physical Activity: Not on file  Stress: Not on file  Social Connections: Not on file    Allergies:  Allergies  Allergen Reactions   Prolixin Decanoate [Fluphenazine]     Ineffective    Benztropine Other (See Comments)    Hair fall out  Suicide thoughts   Benztropine Mesylate Other (See Comments)   Buprenorphine Hcl Other (See Comments)    Unable to void   Carbidopa-Levodopa Nausea Only   Codeine Hives   Cymbalta [Duloxetine Hcl] Other (See Comments)    Altered mental status, Alopecia, visual hallucinations, nightmares   Gabapentin Swelling   Lyrica [Pregabalin] Other (See Comments)    Alopecia, visual hallucinations, nightmares Altered mental status   Morphine And Related Other (See Comments)  Unable to void   Nortripytline Hcl [Nortriptyline] Other (See Comments)    Hair loss and night mares    Nsaids     REACTION: palpitations, diaphoresis   Penicillins Nausea And Vomiting and Other (See Comments)    REACTION: upset stomach Has patient had a PCN reaction causing immediate rash, facial/tongue/throat swelling, SOB or lightheadedness with hypotension: No Has patient had a PCN reaction causing severe rash involving mucus membranes or skin necrosis: No Has patient had a PCN reaction that required hospitalization: No Has patient had a PCN reaction occurring within the last 10 years: No If all of the above answers are "NO", then may proceed with Cephalosporin use.   Wellbutrin [Bupropion]     Constipation, mood swings   Tolmetin Other (See Comments) and Palpitations    REACTION: palpitations, diaphoresis    Metabolic Disorder Labs: Lab Results  Component Value Date   HGBA1C 6.2 (H) 03/06/2017   MPG 131.24 03/06/2017   MPG 108 05/17/2015   Lab Results  Component Value Date   PROLACTIN 4.5 (L)  06/13/2020   PROLACTIN 64.2 (H) 03/06/2017   Lab Results  Component Value Date   CHOL 146 08/22/2018   TRIG 172 (H) 08/22/2018   HDL 49 08/22/2018   CHOLHDL 3.0 08/22/2018   VLDL 34 08/22/2018   LDLCALC 63 08/22/2018   LDLCALC 59 03/06/2017   Lab Results  Component Value Date   TSH 0.959 08/22/2018   TSH 2.766 08/24/2017    Therapeutic Level Labs: No results found for: LITHIUM No results found for: VALPROATE No components found for:  CBMZ  Current Medications: Current Outpatient Medications  Medication Sig Dispense Refill   buprenorphine (BUTRANS) 20 MCG/HR PTWK 1 patch to skin     ACCU-CHEK GUIDE test strip      acetaminophen (TYLENOL) 500 MG tablet Take 1,000 mg by mouth every 6 (six) hours as needed for moderate pain or headache.      albuterol (PROVENTIL HFA;VENTOLIN HFA) 108 (90 Base) MCG/ACT inhaler Inhale 2 puffs into the lungs every 6 (six) hours as needed for wheezing or shortness of breath.     Alcohol Swabs (B-D SINGLE USE SWABS REGULAR) PADS      amitriptyline (ELAVIL) 50 MG tablet Take 1 tablet (50 mg total) by mouth at bedtime. 90 tablet 0   aspirin EC 81 MG tablet Take 81 mg by mouth daily. Swallow whole.     Blood Glucose Calibration (ACCU-CHEK AVIVA) SOLN      Blood Glucose Monitoring Suppl (ACCU-CHEK AVIVA PLUS) w/Device KIT      buprenorphine (BUTRANS) 10 MCG/HR PTWK Place onto the skin once a week.     buprenorphine (BUTRANS) 20 MCG/HR PTWK 1 patch once a week.     butalbital-acetaminophen-caffeine (FIORICET) 50-325-40 MG tablet  (Patient not taking: Reported on 01/28/2021)     carbidopa-levodopa (SINEMET CR) 50-200 MG tablet Take 1 tablet by mouth at bedtime.     carbidopa-levodopa (SINEMET IR) 25-100 MG tablet Take by mouth.     Cholecalciferol (VITAMIN D-3) 125 MCG (5000 UT) TABS Take 5,000 Units by mouth daily.     diclofenac Sodium (VOLTAREN) 1 % GEL Apply 1 application topically 4 (four) times daily.     docusate sodium (COLACE) 50 MG capsule Take  100 mg by mouth 2 (two) times daily.     insulin NPH-regular Human (NOVOLIN 70/30) (70-30) 100 UNIT/ML injection Inject 15 units subcutaneously twice daily with meals     levothyroxine (SYNTHROID) 50 MCG tablet Take  by mouth.     metFORMIN (GLUCOPHAGE) 500 MG tablet Take by mouth.     OLANZapine (ZYPREXA) 5 MG tablet Take 1 tablet (5 mg total) by mouth at bedtime. Take 1 tablet daily at bedtime. 90 tablet 2   pravastatin (PRAVACHOL) 40 MG tablet Take by mouth.     QULIPTA 60 MG TABS      RELION INSULIN SYRINGE 31G X 15/64" 1 ML MISC  (Patient not taking: Reported on 01/28/2021)     rizatriptan (MAXALT) 10 MG tablet Take 10 mg by mouth 2 (two) times daily as needed for migraine. May repeat in 2 hours if needed     rOPINIRole (REQUIP) 0.25 MG tablet Take by mouth. (Patient not taking: Reported on 01/28/2021)     sulfamethoxazole-trimethoprim (BACTRIM DS) 800-160 MG tablet SMARTSIG:1 Tablet(s) By Mouth Every 12 Hours     verapamil (CALAN-SR) 120 MG CR tablet Take 120 mg by mouth daily.     No current facility-administered medications for this visit.     Musculoskeletal: Strength & Muscle Tone: within normal limits Gait & Station:  walks with cane Patient leans: N/A  Psychiatric Specialty Exam: Review of Systems  Musculoskeletal:        Left knee pain - chronic  Psychiatric/Behavioral:  Positive for hallucinations.   All other systems reviewed and are negative.  Blood pressure 122/75, pulse 87, height _0  (1.651 m), weight 260 lb (117.9 kg).Body mass index is 43.27 kg/m.  General Appearance: Casual  Eye Contact:  Fair  Speech:  Clear and Coherent  Volume:  Normal  Mood:  Euthymic  Affect:  Congruent  Thought Process:  Goal Directed and Descriptions of Associations: Intact  Orientation:  Full (Time, Place, and Person)  Thought Content: Hallucinations: Auditory chronic , does not bother her  Suicidal Thoughts:  No  Homicidal Thoughts:  No  Memory:  Immediate;   Fair Recent;    Fair Remote;   limited  Judgement:  Fair  Insight:  Fair  Psychomotor Activity:  Tremor, minimal , more on left hand  Concentration:  Concentration: Fair and Attention Span: Fair  Recall:  [employer] of Knowledge: Fair  Language: Fair  Akathisia:  No  Handed:  Right  AIMS (if indicated): done  Assets:  Communication Skills Desire for Improvement Housing Social Support  ADL's:  Intact  Cognition: WNL  Sleep:   improving   Screenings: [place] Office Visit from 02/25/2021 in [place] Office Visit from 01/28/2021 in [place] Visit from 12/29/2020 in [place] Visit from 12/08/2020 in [place] Visit from 11/13/2020 in [place] Total Score 0 _1 AUDIT    [place] Admission (Discharged) from OP Visit from 08/30/2018 in [facility] [place] Admission (Discharged) from 08/22/2018 in [facility] Admission (Discharged) from 03/03/2017 in [facility] [facility]  Alcohol Use Disorder Identification Test Final Score (AUDIT) 0 0 0      GAD-7    Flowsheet Row Office Visit from 11/13/2020 in [place] Office Visit from 10/14/2020 in [place] Visit from 08/19/2020 in [place] Visit from 08/22/2015 in [place] at [facility]  Total GAD-7 Score _2 [employer]    [place] Office Visit from  01/28/2021 in [place] Office Visit from 12/29/2020 in [place] Visit from 12/08/2020 in [place] Visit from 11/13/2020 in [place] Visit from 08/19/2020 in [place]  PHQ-2 Total Score _0 0 0  PHQ-9 Total Score _1 -- --      Flowsheet Row Office Visit from 01/28/2021 in [place] Office Visit from 12/29/2020 in [place] Office Visit from 06/13/2020 in [place] No Risk No Risk Error: Question 6 not populated        Assessment and Plan: REKIA KUJALA is a 61 year old Caucasian female who has a history of schizoaffective disorder, PTSD, migraine headaches, drug induced Parkinson's disease was evaluated in office today.  Patient is currently improving although continues to have sleep problems and is agreeable to work on sleep hygiene.  Discussed plan as noted below.  Plan Schizoaffective disorder-stable Zyprexa 5 mg p.o. nightly  GAD-improving Discontinue Prozac. Continue Elavil 50 mg p.o. nightly.  PTSD-stable Continue Elavil 50 mg p.o. nightly  Insomnia-improving Elavil 50 mg p.o. nightly Advised to continue sleep hygiene techniques.  Drug induced Parkinson's disease-stable Currently on carbidopa and levodopa per primary care provider/neurology. AIMS - 0  Discussed getting labs-TSH level done.  Follow-up in clinic in 2 to 3 months or sooner in person.  This note was generated in part or whole with voice recognition software. Voice recognition is usually quite accurate but there are transcription errors that can and very often do occur. I apologize for any typographical errors that were not detected and corrected.     Ursula Alert, MD 02/25/2021, 5:53 PM

## 2021-03-09 DIAGNOSIS — G2119 Other drug induced secondary parkinsonism: Secondary | ICD-10-CM | POA: Diagnosis not present

## 2021-03-09 DIAGNOSIS — G252 Other specified forms of tremor: Secondary | ICD-10-CM | POA: Diagnosis not present

## 2021-03-09 DIAGNOSIS — Z0000 Encounter for general adult medical examination without abnormal findings: Secondary | ICD-10-CM | POA: Diagnosis not present

## 2021-03-09 DIAGNOSIS — F209 Schizophrenia, unspecified: Secondary | ICD-10-CM | POA: Diagnosis not present

## 2021-03-09 DIAGNOSIS — R519 Headache, unspecified: Secondary | ICD-10-CM | POA: Diagnosis not present

## 2021-03-09 DIAGNOSIS — E1165 Type 2 diabetes mellitus with hyperglycemia: Secondary | ICD-10-CM | POA: Diagnosis not present

## 2021-03-09 DIAGNOSIS — G43719 Chronic migraine without aura, intractable, without status migrainosus: Secondary | ICD-10-CM | POA: Diagnosis not present

## 2021-03-09 DIAGNOSIS — Z6841 Body Mass Index (BMI) 40.0 and over, adult: Secondary | ICD-10-CM | POA: Diagnosis not present

## 2021-03-09 DIAGNOSIS — N182 Chronic kidney disease, stage 2 (mild): Secondary | ICD-10-CM | POA: Diagnosis not present

## 2021-03-16 DIAGNOSIS — E1165 Type 2 diabetes mellitus with hyperglycemia: Secondary | ICD-10-CM | POA: Diagnosis not present

## 2021-03-16 DIAGNOSIS — F209 Schizophrenia, unspecified: Secondary | ICD-10-CM | POA: Diagnosis not present

## 2021-03-16 DIAGNOSIS — I10 Essential (primary) hypertension: Secondary | ICD-10-CM | POA: Diagnosis not present

## 2021-03-16 DIAGNOSIS — N898 Other specified noninflammatory disorders of vagina: Secondary | ICD-10-CM | POA: Diagnosis not present

## 2021-03-16 DIAGNOSIS — K219 Gastro-esophageal reflux disease without esophagitis: Secondary | ICD-10-CM | POA: Diagnosis not present

## 2021-03-16 DIAGNOSIS — Z6841 Body Mass Index (BMI) 40.0 and over, adult: Secondary | ICD-10-CM | POA: Diagnosis not present

## 2021-03-16 DIAGNOSIS — G2119 Other drug induced secondary parkinsonism: Secondary | ICD-10-CM | POA: Diagnosis not present

## 2021-03-16 DIAGNOSIS — Z0000 Encounter for general adult medical examination without abnormal findings: Secondary | ICD-10-CM | POA: Diagnosis not present

## 2021-03-19 DIAGNOSIS — R69 Illness, unspecified: Secondary | ICD-10-CM | POA: Diagnosis not present

## 2021-03-19 DIAGNOSIS — K006 Disturbances in tooth eruption: Secondary | ICD-10-CM | POA: Diagnosis not present

## 2021-04-01 DIAGNOSIS — E1122 Type 2 diabetes mellitus with diabetic chronic kidney disease: Secondary | ICD-10-CM | POA: Diagnosis not present

## 2021-04-01 DIAGNOSIS — N1831 Chronic kidney disease, stage 3a: Secondary | ICD-10-CM | POA: Diagnosis not present

## 2021-04-01 DIAGNOSIS — Z794 Long term (current) use of insulin: Secondary | ICD-10-CM | POA: Diagnosis not present

## 2021-04-01 DIAGNOSIS — E1165 Type 2 diabetes mellitus with hyperglycemia: Secondary | ICD-10-CM | POA: Diagnosis not present

## 2021-04-14 DIAGNOSIS — G894 Chronic pain syndrome: Secondary | ICD-10-CM | POA: Diagnosis not present

## 2021-04-17 DIAGNOSIS — G43719 Chronic migraine without aura, intractable, without status migrainosus: Secondary | ICD-10-CM | POA: Diagnosis not present

## 2021-04-17 DIAGNOSIS — R531 Weakness: Secondary | ICD-10-CM | POA: Diagnosis not present

## 2021-04-17 DIAGNOSIS — G2119 Other drug induced secondary parkinsonism: Secondary | ICD-10-CM | POA: Diagnosis not present

## 2021-04-21 NOTE — Progress Notes (Signed)
05/04/21 ?9:36 AM  ? ?Heather Haley ?05-Nov-1960 ?[id number] ? ?Referring provider:  ?Tracie Harrier, MD ?[place] ?[facility] ?[place],   [id number] ? ?Chief Complaint  ?Patient presents with  ? Nephrolithiasis  ? ? ?Urological history ?Nephrolithiasis  ?- CTU in 05/2019 Small 2 mm nonobstructing RIGHT upper pole renal calculus. 3 mm nonobstructing LEFT renal calculus.  There is a mildly obstructing calculus at the LEFT ureteropelvic junction measuring 3 mm in axial dimension (image 46/2). This calculus is well seen on coronal image 90/5. No hydronephrosis.  ?- KUB 07/31/2019 notes migration of the left UPJ stone into the left ureter at the sacral ala.  KUB 08/23/2019 notes migration of the left ureteral stone just under the sacrum.   KUB on 08/30/2019 showed unchanged UPJ stone in the distal ureter ?- S/p ureteroscopy with Dr. Bernardo Heater on 09/11/2019 ?- Stone analysis 70% calcium oxalate monohydrate, 10% calcium oxalate dihydrate, and 20 % hydroxyapatite ? ?2. History of high risk hematuria  ?- Former smoker  ?- CT urogram on 06/08/19 showed a minimally obstructing calculus at the LEFT ureteropelvic junction, LEFT ureter urothelial enhancement distal to the UPJ calculus suggest ureteral inflammation or infection and bilateral nephrolithiasis.  ?- cystoscopy with Dr. Bernardo Heater on 06/25/2019 was NED.  Her urine cytology was negative.   ? ? ?HPI: ?Heather Haley is a 61 y.o.female who presents today for follow-up on kidney stones.  ? ?KUB was personally reviewed today and visualizes stool and gas obstruction overlying renal shadows making it harder to visualize but there are no obvious stone burdens. ? ?She reports that Dr Ginette Pitman informed her that she has blood in her urine (10-50 RBC's)  She has back pain on both sides and flank pain. She reports that she has pain when she urinates. She has urinary urgency and urinary accidents she attributes this to her water pills. She reports that her  urine is bright yellow with an odor. ? ?Patient denies any modifying or aggravating factors.  Patient denies any gross hematuria, dysuria.  Patient denies any fevers, chills, nausea or vomiting.  ? ?PMH: ?Past Medical History:  ?Diagnosis Date  ? Anxiety   ? Asthma   ? Chronic kidney disease   ? Chronic pain   ? previously saw Dr. Consuela Mimes in pain clinic, then saw pain specialist in [place]  ? Depression   ? Diabetes mellitus ([place])   ? Frequency of urination   ? GERD (gastroesophageal reflux disease)   ? Headache(784.0)   ? High cholesterol   ? History of kidney stones   ? Hypertension   ? IBS (irritable bowel syndrome)   ? Left ankle instability   ? Left knee DJD   ? Lumbar Degenerative Disc Disease of  10/11/2014  ? Neuromuscular disorder ([place])   ? Osteoarthritis of hip (Right) 05/05/2015  ? Other enthesopathy of ankle and tarsus 12/15/2009  ? Qualifier: Diagnosis of  By: Oneida Alar MD, KARL    ? Parkinson's disease ([place])   ? Peripheral sensory neuropathy (Bilateral) 11/19/2014  ? Postoperative nausea and vomiting   ? Schizophrenia ([place])   ? ? ?Surgical History: ?Past Surgical History:  ?Procedure Laterality Date  ? ABDOMINAL HYSTERECTOMY    ? ANKLE SURGERY Left   ? x 2  ? APPENDECTOMY    ? CARDIAC CATHETERIZATION    ? COLONOSCOPY  2013  ? COLONOSCOPY WITH PROPOFOL N/A 04/25/2017  ? Procedure: COLONOSCOPY WITH PROPOFOL;  Surgeon: Manya Silvas, MD;  Location[facility] ENDOSCOPY;  Service: Endoscopy;  Laterality: N/A;  ? CYSTOSCOPY/URETEROSCOPY/HOLMIUM LASER/STENT PLACEMENT Left 09/11/2019  ? Procedure: CYSTOSCOPY/URETEROSCOPY/HOLMIUM LASER/STENT PLACEMENT;  Surgeon: Abbie Sons, MD;  Location: [facility] ORS;  Service: Urology;  Laterality: Left;  ? ESOPHAGOGASTRODUODENOSCOPY (EGD) WITH PROPOFOL N/A 10/03/2014  ? Procedure: ESOPHAGOGASTRODUODENOSCOPY (EGD) WITH PROPOFOL;  Surgeon: Josefine Class, MD;  Location: [facility] ENDOSCOPY;  Service: Endoscopy;  Laterality: N/A;  ? ESOPHAGOGASTRODUODENOSCOPY (EGD) WITH PROPOFOL   04/25/2017  ? Procedure: ESOPHAGOGASTRODUODENOSCOPY (EGD) WITH PROPOFOL;  Surgeon: Manya Silvas, MD;  Location: [facility] ENDOSCOPY;  Service: Endoscopy;;  ? JOINT REPLACEMENT Left   ? knee  ? KNEE ARTHROSCOPY  1997  ? left knee  ? LEFT HEART CATH AND CORONARY ANGIOGRAPHY Left 11/10/2017  ? Procedure: LEFT HEART CATH AND CORONARY ANGIOGRAPHY;  Surgeon: Isaias Cowman, MD;  Location: [place] CV LAB;  Service: Cardiovascular;  Laterality: Left;  ? LEFT HEART CATHETERIZATION WITH CORONARY ANGIOGRAM N/A 01/11/2013  ? Procedure: LEFT HEART CATHETERIZATION WITH CORONARY ANGIOGRAM;  Surgeon: Sinclair Grooms, MD;  Location: [facility] CATH LAB;  Service: Cardiovascular;  Laterality: N/A;  ? TOTAL KNEE ARTHROPLASTY  08/30/2011  ? Procedure: TOTAL KNEE ARTHROPLASTY;  Surgeon: Lorn Junes, MD;  Location: [place];  Service: Orthopedics;  Laterality: Left;  ? ? ?Home Medications:  ?Allergies as of 04/23/2021   ? ?   Reactions  ? Prolixin Decanoate [fluphenazine]   ? Ineffective   ? Benztropine Other (See Comments)  ? Hair fall out  ?Suicide thoughts  ? Benztropine Mesylate Other (See Comments)  ? Buprenorphine Hcl Other (See Comments)  ? Unable to void  ? Carbidopa-levodopa Nausea Only  ? Codeine Hives  ? Cymbalta [duloxetine Hcl] Other (See Comments)  ? Altered mental status, Alopecia, visual hallucinations, nightmares  ? Gabapentin Swelling  ? Lyrica [pregabalin] Other (See Comments)  ? Alopecia, visual hallucinations, nightmares ?Altered mental status  ? Morphine And Related Other (See Comments)  ? Unable to void  ? Nortripytline Hcl [nortriptyline] Other (See Comments)  ? Hair loss and night mares  ? Nsaids   ? REACTION: palpitations, diaphoresis  ? Penicillins Nausea And Vomiting, Other (See Comments)  ? REACTION: upset stomach ?Has patient had a PCN reaction causing immediate rash, facial/tongue/throat swelling, SOB or lightheadedness with hypotension: No ?Has patient had a PCN reaction causing severe rash involving  mucus membranes or skin necrosis: No ?Has patient had a PCN reaction that required hospitalization: No ?Has patient had a PCN reaction occurring within the last 10 years: No ?If all of the above answers are "NO", then may proceed with Cephalosporin use.  ? Wellbutrin [bupropion]   ? Constipation, mood swings  ? Tolmetin Other (See Comments), Palpitations  ? REACTION: palpitations, diaphoresis  ? ?  ? ?  ?Medication List  ?  ? ?  ? Accurate as of April 23, 2021 11:59 PM. If you have any questions, ask your nurse or doctor.  ?  ?  ? ?  ? ?STOP taking these medications   ? ?sulfamethoxazole-trimethoprim 800-160 MG tablet ?Commonly known as: BACTRIM DS ?Stopped by: Zara Council, PA-C ?  ? ?  ? ?TAKE these medications   ? ?Accu-Chek Aviva Plus w/Device Kit ?  ?Accu-Chek Aviva Soln ?  ?Accu-Chek Guide test strip ?Generic drug: glucose blood ?  ?acetaminophen 500 MG tablet ?Commonly known as: TYLENOL ?Take 1,000 mg by mouth every 6 (six) hours as needed for moderate pain or headache. ?  ?albuterol 108 (90 Base) MCG/ACT inhaler ?Commonly known as: VENTOLIN HFA ?  Inhale 2 puffs into the lungs every 6 (six) hours as needed for wheezing or shortness of breath. ?  ?amitriptyline 50 MG tablet ?Commonly known as: ELAVIL ?Take 1 tablet (50 mg total) by mouth at bedtime. ?  ?aspirin EC 81 MG tablet ?Take 81 mg by mouth daily. Swallow whole. ?  ?B-D SINGLE USE SWABS REGULAR Pads ?  ?buprenorphine 10 MCG/HR Ptwk ?Commonly known as: BUTRANS ?Place onto the skin once a week. ?  ?buprenorphine 20 MCG/HR Ptwk ?Commonly known as: BUTRANS ?1 patch to skin ?  ?buprenorphine 20 MCG/HR Ptwk ?Commonly known as: BUTRANS ?1 patch once a week. ?  ?butalbital-acetaminophen-caffeine 50-325-40 MG tablet ?Commonly known as: FIORICET ?  ?carbidopa-levodopa 25-100 MG tablet ?Commonly known as: SINEMET IR ?Take by mouth. ?  ?carbidopa-levodopa 50-200 MG tablet ?Commonly known as: SINEMET CR ?Take 1 tablet by mouth at bedtime. ?  ?diclofenac Sodium 1 %  Gel ?Commonly known as: VOLTAREN ?Apply 1 application topically 4 (four) times daily. ?  ?docusate sodium 50 MG capsule ?Commonly known as: COLACE ?Take 100 mg by mouth 2 (two) times daily. ?  ?levothyroxine

## 2021-04-23 ENCOUNTER — Ambulatory Visit
Admission: RE | Admit: 2021-04-23 | Discharge: 2021-04-23 | Disposition: A | Discharge status: Home / Self Care | Payer: Medicare HMO | Source: Ambulatory Visit | Attending: Urology | Admitting: Urology

## 2021-04-23 ENCOUNTER — Ambulatory Visit: Payer: Medicare HMO | Admitting: Urology

## 2021-04-23 ENCOUNTER — Other Ambulatory Visit: Payer: Self-pay

## 2021-04-23 ENCOUNTER — Ambulatory Visit
Admission: RE | Admit: 2021-04-23 | Discharge: 2021-04-23 | Disposition: A | Discharge status: Home / Self Care | Payer: Medicare HMO | Attending: Urology | Admitting: Urology

## 2021-04-23 ENCOUNTER — Other Ambulatory Visit: Payer: Self-pay | Admitting: *Deleted

## 2021-04-23 ENCOUNTER — Encounter: Payer: Self-pay | Admitting: Urology

## 2021-04-23 VITALS — BP 166/99 | HR 101 | Ht 64.0 in | Wt 250.0 lb

## 2021-04-23 DIAGNOSIS — N201 Calculus of ureter: Secondary | ICD-10-CM | POA: Insufficient documentation

## 2021-04-23 DIAGNOSIS — R3129 Other microscopic hematuria: Secondary | ICD-10-CM | POA: Diagnosis not present

## 2021-04-23 DIAGNOSIS — R109 Unspecified abdominal pain: Secondary | ICD-10-CM | POA: Diagnosis not present

## 2021-04-23 DIAGNOSIS — N200 Calculus of kidney: Secondary | ICD-10-CM | POA: Diagnosis not present

## 2021-04-24 ENCOUNTER — Other Ambulatory Visit: Payer: Self-pay

## 2021-04-24 ENCOUNTER — Other Ambulatory Visit: Payer: Medicare HMO

## 2021-04-24 DIAGNOSIS — N201 Calculus of ureter: Secondary | ICD-10-CM

## 2021-04-24 DIAGNOSIS — N200 Calculus of kidney: Secondary | ICD-10-CM | POA: Diagnosis not present

## 2021-04-24 DIAGNOSIS — R3129 Other microscopic hematuria: Secondary | ICD-10-CM | POA: Diagnosis not present

## 2021-04-24 LAB — URINALYSIS, COMPLETE
Bilirubin, UA: NEGATIVE
Bilirubin, UA: NEGATIVE
Glucose, UA: NEGATIVE
Glucose, UA: NEGATIVE
Ketones, UA: NEGATIVE
Nitrite, UA: NEGATIVE
Nitrite, UA: NEGATIVE
Protein,UA: NEGATIVE
RBC, UA: NEGATIVE
RBC, UA: NEGATIVE
Specific Gravity, UA: >1.03 — ABNORMAL HIGH (ref 1.005–1.030)
Specific Gravity, UA: 1.02 (ref 1.005–1.030)
Urobilinogen, Ur: 0.2 mg/dL (ref 0.2–1.0)
Urobilinogen, Ur: 0.2 mg/dL (ref 0.2–1.0)
pH, UA: 6 (ref 5.0–7.5)
pH, UA: 6 (ref 5.0–7.5)

## 2021-04-24 LAB — MICROSCOPIC EXAMINATION

## 2021-04-28 LAB — CULTURE, URINE COMPREHENSIVE

## 2021-04-29 ENCOUNTER — Encounter: Payer: Self-pay | Admitting: Psychiatry

## 2021-04-29 ENCOUNTER — Other Ambulatory Visit: Payer: Self-pay

## 2021-04-29 ENCOUNTER — Ambulatory Visit (INDEPENDENT_AMBULATORY_CARE_PROVIDER_SITE_OTHER): Payer: Medicare HMO | Admitting: Psychiatry

## 2021-04-29 VITALS — BP 161/103 | HR 97 | Temp 97.9°F | Wt 253.0 lb

## 2021-04-29 DIAGNOSIS — F411 Generalized anxiety disorder: Secondary | ICD-10-CM

## 2021-04-29 DIAGNOSIS — F251 Schizoaffective disorder, depressive type: Secondary | ICD-10-CM

## 2021-04-29 DIAGNOSIS — G4701 Insomnia due to medical condition: Secondary | ICD-10-CM

## 2021-04-29 DIAGNOSIS — F4310 Post-traumatic stress disorder, unspecified: Secondary | ICD-10-CM | POA: Diagnosis not present

## 2021-04-29 DIAGNOSIS — G2119 Other drug induced secondary parkinsonism: Secondary | ICD-10-CM

## 2021-04-29 MED ORDER — AMITRIPTYLINE HCL 50 MG PO TABS: 50.0000 mg | ORAL_TABLET | Freq: Every day | ORAL | 3 refills | Status: DC

## 2021-04-29 NOTE — Progress Notes (Signed)
Heather Haley OP Progress Note ? ?04/29/2021 2:49 PM ?[name]  ?MRN:  [id number] ? ?Chief Complaint:  ?Chief Complaint  ?Patient presents with  ? Follow-up: 61 year old Caucasian female with history of schizoaffective disorder, PTSD, presented for medication management.  ? ?HPI: Heather Haley is a 61 year old Caucasian female, lives in [place], has a history of schizoaffective disorder, PTSD, GAD, drug induced Parkinson's disease, insomnia, headache, memory problems, diabetes mellitus, chronic pain was evaluated in office today. ? ?Patient today reports overall she has been doing fairly well with regards to her mood. ? ?Reports she does continue to have anxiety when she goes into public setting when it is very crowded.  She reports she had a couple of anxiety attacks recently when she went to the grocery store but it was crowded.  She reports she tries to avoid going to the store during peak hours. hours. ? ?Patient reports sleep was good. ? ?Continues to have auditory hallucinations however there are chronic, able to cope well, does not overwhelm her. ? ?Currently compliant on medications.  Denies side effects. ? ?Denies suicidality, homicidality. ? ?Denies any other concerns today. ? ?Visit Diagnosis:  ?  ICD-10-CM   ?1. Schizoaffective disorder, depressive type ([place])  F25.1   ?  ?2. PTSD (post-traumatic stress disorder)  F43.10 amitriptyline (ELAVIL) 50 MG tablet  ?  ?3. Generalized anxiety disorder  F41.1 amitriptyline (ELAVIL) 50 MG tablet  ?  ?4. Drug-induced Parkinson's disease ([place])  G21.19   ?  ?5. Insomnia due to medical condition  G47.01   ? mood pain  ?  ? ? ?Past Psychiatric History: Reviewed past psychiatric history from progress note on 05/25/2017. ? ?Past Medical History:  ?Past Medical History:  ?Diagnosis Date  ? Anxiety   ? Asthma   ? Chronic kidney disease   ? Chronic pain   ? previously saw Dr. Consuela Mimes in pain clinic, then saw pain specialist in [place]  ? Depression   ? Diabetes  mellitus ([place])   ? Frequency of urination   ? GERD (gastroesophageal reflux disease)   ? Headache(784.0)   ? High cholesterol   ? History of kidney stones   ? Hypertension   ? IBS (irritable bowel syndrome)   ? Left ankle instability   ? Left knee DJD   ? Lumbar Degenerative Disc Disease of  10/11/2014  ? Neuromuscular disorder ([place])   ? Osteoarthritis of hip (Right) 05/05/2015  ? Other enthesopathy of ankle and tarsus 12/15/2009  ? Qualifier: Diagnosis of  By: Oneida Alar Haley, KARL    ? Parkinson's disease ([place])   ? Peripheral sensory neuropathy (Bilateral) 11/19/2014  ? Postoperative nausea and vomiting   ? Schizophrenia ([place])   ?  ?Past Surgical History:  ?Procedure Laterality Date  ? ABDOMINAL HYSTERECTOMY    ? ANKLE SURGERY Left   ? x 2  ? APPENDECTOMY    ? CARDIAC CATHETERIZATION    ? COLONOSCOPY  2013  ? COLONOSCOPY WITH PROPOFOL N/A 04/25/2017  ? Procedure: COLONOSCOPY WITH PROPOFOL;  Surgeon: Manya Silvas, Haley;  Location: [facility] ENDOSCOPY;  Service: Endoscopy;  Laterality: N/A;  ? CYSTOSCOPY/URETEROSCOPY/HOLMIUM LASER/STENT PLACEMENT Left 09/11/2019  ? Procedure: CYSTOSCOPY/URETEROSCOPY/HOLMIUM LASER/STENT PLACEMENT;  Surgeon: Abbie Sons, Haley;  Location: [facility] ORS;  Service: Urology;  Laterality: Left;  ? ESOPHAGOGASTRODUODENOSCOPY (EGD) WITH PROPOFOL N/A 10/03/2014  ? Procedure: ESOPHAGOGASTRODUODENOSCOPY (EGD) WITH PROPOFOL;  Surgeon: Josefine Class, Haley;  Location: [facility] ENDOSCOPY;  Service: Endoscopy;  Laterality: N/A;  ? ESOPHAGOGASTRODUODENOSCOPY (EGD) WITH PROPOFOL  04/25/2017  ? Procedure: ESOPHAGOGASTRODUODENOSCOPY (EGD) WITH PROPOFOL;  Surgeon: Manya Silvas, Haley;  Location[facility] ENDOSCOPY;  Service: Endoscopy;;  ? JOINT REPLACEMENT Left   ? knee  ? KNEE ARTHROSCOPY  1997  ? left knee  ? LEFT HEART CATH AND CORONARY ANGIOGRAPHY Left 11/10/2017  ? Procedure: LEFT HEART CATH AND CORONARY ANGIOGRAPHY;  Surgeon: Isaias Cowman, Haley;  Location: [facility] CV LAB;  Service: Cardiovascular;   Laterality: Left;  ? LEFT HEART CATHETERIZATION WITH CORONARY ANGIOGRAM N/A 01/11/2013  ? Procedure: LEFT HEART CATHETERIZATION WITH CORONARY ANGIOGRAM;  Surgeon: Sinclair Grooms, Haley;  Location: [facility] CATH LAB;  Service: Cardiovascular;  Laterality: N/A;  ? TOTAL KNEE ARTHROPLASTY  08/30/2011  ? Procedure: TOTAL KNEE ARTHROPLASTY;  Surgeon: Lorn Junes, Haley;  Location: [facility];  Service: Orthopedics;  Laterality: Left;  ? ? ?Family Psychiatric History: Reviewed family psychiatric history from progress note on 05/25/2017. ? ?Family History:  ?Family History  ?Problem Relation Age of Onset  ? Cancer Mother   ? Hypertension Father   ? Heart disease Father   ? Alcohol abuse Father   ? Breast cancer Neg Hx   ? ? ?Social History: Reviewed social history from progress note on 05/25/2017. ?Social History  ? ?Socioeconomic History  ? Marital status: Divorced  ?  Spouse name: Not on file  ? Number of children: 2  ? Years of education: 85  ? Highest education level: 11th grade  ?Occupational History  ?  Employer: [employer]  ?Tobacco Use  ? Smoking status: Former  ?  Packs/day: 2.00  ?  Years: 15.00  ?  Pack years: 30.00  ?  Types: Cigarettes  ?  Quit date: 02/09/1995  ?  Years since quitting: 26.2  ? Smokeless tobacco: Never  ?Vaping Use  ? Vaping Use: Never used  ?Substance and Sexual Activity  ? Alcohol use: No  ?  Alcohol/week: 0.0 standard drinks  ? Drug use: No  ? Sexual activity: Not Currently  ?Other Topics Concern  ? Not on file  ?Social History Narrative  ? Patient lives at home alone. Patient works at [employer].  ? Caffeine daily- 2  ? Right handed.  ? Education- 11 th grade  ? ?Social Determinants of Health  ? ?Financial Resource Strain: Not on file  ?Food Insecurity: Not on file  ?Transportation Needs: Not on file  ?Physical Activity: Not on file  ?Stress: Not on file  ?Social Connections: Not on file  ? ? ?Allergies:  ?Allergies  ?Allergen Reactions  ? Prolixin Decanoate [Fluphenazine]   ?  Ineffective   ?  Benztropine Other (See Comments)  ?  Hair fall out  ?Suicide thoughts  ? Benztropine Mesylate Other (See Comments)  ? Buprenorphine Hcl Other (See Comments)  ?  Unable to void  ? Carbidopa-Levodopa Nausea Only  ? Codeine Hives  ? Cymbalta [Duloxetine Hcl] Other (See Comments)  ?  Altered mental status, Alopecia, visual hallucinations, nightmares  ? Gabapentin Swelling  ? Lyrica [Pregabalin] Other (See Comments)  ?  Alopecia, visual hallucinations, nightmares ?Altered mental status  ? Morphine And Related Other (See Comments)  ?  Unable to void  ? Nortripytline Hcl [Nortriptyline] Other (See Comments)  ?  Hair loss and night mares ?  ? Nsaids   ?  REACTION: palpitations, diaphoresis  ? Penicillins Nausea And Vomiting and Other (See Comments)  ?  REACTION: upset stomach ?Has patient had a PCN reaction causing immediate rash, facial/tongue/throat swelling, SOB or  lightheadedness with hypotension: No ?Has patient had a PCN reaction causing severe rash involving mucus membranes or skin necrosis: No ?Has patient had a PCN reaction that required hospitalization: No ?Has patient had a PCN reaction occurring within the last 10 years: No ?If all of the above answers are "NO", then may proceed with Cephalosporin use.  ? Risperidone And Related Other (See Comments)  ?  tremors  ? Wellbutrin [Bupropion]   ?  Constipation, mood swings  ? Tolmetin Other (See Comments) and Palpitations  ?  REACTION: palpitations, diaphoresis  ? ? ?Metabolic Disorder Labs: ?Lab Results  ?Component Value Date  ? HGBA1C 6.2 (H) 03/06/2017  ? MPG 131.24 03/06/2017  ? MPG 108 05/17/2015  ? ?Lab Results  ?Component Value Date  ? PROLACTIN 4.5 (L) 06/13/2020  ? PROLACTIN 64.2 (H) 03/06/2017  ? ?Lab Results  ?Component Value Date  ? CHOL 146 08/22/2018  ? TRIG 172 (H) 08/22/2018  ? HDL 49 08/22/2018  ? CHOLHDL 3.0 08/22/2018  ? VLDL 34 08/22/2018  ? [place] 63 08/22/2018  ? [place] 59 03/06/2017  ? ?Lab Results  ?Component Value Date  ? TSH 0.959  08/22/2018  ? TSH 2.766 08/24/2017  ? ? ?Therapeutic Level Labs: ?No results found for: LITHIUM ?No results found for: VALPROATE ?No components found for:  CBMZ ? ?Current Medications: ?Current Outpatient Medications  ?Medi

## 2021-05-05 ENCOUNTER — Other Ambulatory Visit: Payer: Self-pay | Admitting: Urology

## 2021-05-08 ENCOUNTER — Ambulatory Visit
Admission: RE | Admit: 2021-05-08 | Discharge: 2021-05-08 | Disposition: A | Discharge status: Home / Self Care | Payer: Medicare HMO | Source: Ambulatory Visit | Attending: Urology | Admitting: Urology

## 2021-05-08 DIAGNOSIS — R3129 Other microscopic hematuria: Secondary | ICD-10-CM | POA: Insufficient documentation

## 2021-05-08 DIAGNOSIS — R162 Hepatomegaly with splenomegaly, not elsewhere classified: Secondary | ICD-10-CM | POA: Diagnosis not present

## 2021-05-08 DIAGNOSIS — R319 Hematuria, unspecified: Secondary | ICD-10-CM | POA: Diagnosis not present

## 2021-05-08 DIAGNOSIS — N3289 Other specified disorders of bladder: Secondary | ICD-10-CM | POA: Diagnosis not present

## 2021-05-08 LAB — POCT I-STAT CREATININE: Creatinine, Ser: 0.9 mg/dL (ref 0.44–1.00)

## 2021-05-08 MED ORDER — IOHEXOL 300 MG/ML  SOLN
100.0000 mL | Freq: Once | INTRAMUSCULAR | Status: AC | PRN
  Administered 2021-05-08: 100 mL via INTRAVENOUS

## 2021-05-19 NOTE — Progress Notes (Signed)
05/28/21 ?1:35 PM  ? ?Heather Haley ?[birth year] ?[id number] ? ?Referring provider:  ?Tracie Harrier, MD ?[facility] ?[place],  [place] [address] ? ?Chief Complaint  ?Patient presents with  ? Results  ? ?Urological history ?Nephrolithiasis  ?- Stone analysis 70% calcium oxalate monohydrate, 10% calcium oxalate dihydrate, and 20 % hydroxyapatite ?- S/p ureteroscopy with Dr. Bernardo Heater on 09/11/2019 ?- CTU 04/2021 - negative for nephrolithiasis ? ?2. History of high risk hematuria  ?- Former smoker  ?- cystoscopy with Dr. Bernardo Heater on 06/25/2019 was NED.  Her urine cytology was negative ?- CTU 2023 - Bosniak 2 cyst  ?- no reports of gross heme ?- Cath UA > 30 RBC's  ? ? ?HPI: ?Heather Haley is a 61 y.o.female who presents today for CT urogram report.   ? ?CTU report - no nephrolithiasis, obstructive uropathy or definite suspicious mass identified. Stable to slightly decreased size of a mildly complex cyst in the right kidney as described, Bosniak 2, no routine follow-up required.  Hepatomegaly with suggestion of hepatic steatosis.  Splenomegaly. ? ?I believe there may be findings for a right UVJ stone on CT. ? ?She continues to have back pain which she states is more lateralized to the left.  She has not passed any fragments.  Patient denies any modifying or aggravating factors.  Patient denies any gross hematuria, dysuria or suprapubic/flank pain.  Patient denies any fevers, chills, nausea or vomiting.   ? ?She could not give [place] a urine today even after drinking 4 large glasses of ice water.  She states is very typical for her.  She states she urinates about twice during the day and then urinates every 2-3 hours at night. ? ?CATH UA > 30 RBC's  ? ?PMH: ?Past Medical History:  ?Diagnosis Date  ? Anxiety   ? Asthma   ? Chronic kidney disease   ? Chronic pain   ? previously saw Dr. Consuela Mimes in pain clinic, then saw pain specialist in [place]  ? Depression   ? Diabetes mellitus ([place])    ? Frequency of urination   ? GERD (gastroesophageal reflux disease)   ? Headache(784.0)   ? High cholesterol   ? History of kidney stones   ? Hypertension   ? IBS (irritable bowel syndrome)   ? Left ankle instability   ? Left knee DJD   ? Lumbar Degenerative Disc Disease of  10/11/2014  ? Neuromuscular disorder ([place])   ? Osteoarthritis of hip (Right) 05/05/2015  ? Other enthesopathy of ankle and tarsus 12/15/2009  ? Qualifier: Diagnosis of  By: Oneida Alar MD, KARL    ? Parkinson's disease ([place])   ? Peripheral sensory neuropathy (Bilateral) 11/19/2014  ? Postoperative nausea and vomiting   ? Schizophrenia ([place])   ? ? ?Surgical History: ?Past Surgical History:  ?Procedure Laterality Date  ? ABDOMINAL HYSTERECTOMY    ? ANKLE SURGERY Left   ? x 2  ? APPENDECTOMY    ? CARDIAC CATHETERIZATION    ? COLONOSCOPY  2013  ? COLONOSCOPY WITH PROPOFOL N/A 04/25/2017  ? Procedure: COLONOSCOPY WITH PROPOFOL;  Surgeon: Manya Silvas, MD;  Location: [facility] ENDOSCOPY;  Service: Endoscopy;  Laterality: N/A;  ? CYSTOSCOPY/URETEROSCOPY/HOLMIUM LASER/STENT PLACEMENT Left 09/11/2019  ? Procedure: CYSTOSCOPY/URETEROSCOPY/HOLMIUM LASER/STENT PLACEMENT;  Surgeon: Abbie Sons, MD;  Location: [facility] ORS;  Service: Urology;  Laterality: Left;  ? ESOPHAGOGASTRODUODENOSCOPY (EGD) WITH PROPOFOL N/A 10/03/2014  ? Procedure: ESOPHAGOGASTRODUODENOSCOPY (EGD) WITH PROPOFOL;  Surgeon: Josefine Class, MD;  Location:  [place] ENDOSCOPY;  Service: Endoscopy;  Laterality: N/A;  ? ESOPHAGOGASTRODUODENOSCOPY (EGD) WITH PROPOFOL  04/25/2017  ? Procedure: ESOPHAGOGASTRODUODENOSCOPY (EGD) WITH PROPOFOL;  Surgeon: Manya Silvas, MD;  Location: [facility] ENDOSCOPY;  Service: Endoscopy;;  ? JOINT REPLACEMENT Left   ? knee  ? KNEE ARTHROSCOPY  1997  ? left knee  ? LEFT HEART CATH AND CORONARY ANGIOGRAPHY Left 11/10/2017  ? Procedure: LEFT HEART CATH AND CORONARY ANGIOGRAPHY;  Surgeon: Isaias Cowman, MD;  Location: [place] CV LAB;  Service: Cardiovascular;   Laterality: Left;  ? LEFT HEART CATHETERIZATION WITH CORONARY ANGIOGRAM N/A 01/11/2013  ? Procedure: LEFT HEART CATHETERIZATION WITH CORONARY ANGIOGRAM;  Surgeon: Sinclair Grooms, MD;  Location: [facility] CATH LAB;  Service: Cardiovascular;  Laterality: N/A;  ? TOTAL KNEE ARTHROPLASTY  08/30/2011  ? Procedure: TOTAL KNEE ARTHROPLASTY;  Surgeon: Lorn Junes, MD;  Location: [place];  Service: Orthopedics;  Laterality: Left;  ? ? ?Home Medications:  ?Allergies as of 05/20/2021   ? ?   Reactions  ? Prolixin Decanoate [fluphenazine]   ? Ineffective   ? Benztropine Other (See Comments)  ? Hair fall out  ?Suicide thoughts  ? Benztropine Mesylate Other (See Comments)  ? Buprenorphine Hcl Other (See Comments)  ? Unable to void  ? Carbidopa-levodopa Nausea Only  ? Codeine Hives  ? Cymbalta [duloxetine Hcl] Other (See Comments)  ? Altered mental status, Alopecia, visual hallucinations, nightmares  ? Gabapentin Swelling  ? Lyrica [pregabalin] Other (See Comments)  ? Alopecia, visual hallucinations, nightmares ?Altered mental status  ? Morphine And Related Other (See Comments)  ? Unable to void  ? Nortripytline Hcl [nortriptyline] Other (See Comments)  ? Hair loss and night mares  ? Nsaids   ? REACTION: palpitations, diaphoresis  ? Penicillins Nausea And Vomiting, Other (See Comments)  ? REACTION: upset stomach ?Has patient had a PCN reaction causing immediate rash, facial/tongue/throat swelling, SOB or lightheadedness with hypotension: No ?Has patient had a PCN reaction causing severe rash involving mucus membranes or skin necrosis: No ?Has patient had a PCN reaction that required hospitalization: No ?Has patient had a PCN reaction occurring within the last 10 years: No ?If all of the above answers are "NO", then may proceed with Cephalosporin use.  ? Risperidone And Related Other (See Comments)  ? tremors  ? Wellbutrin [bupropion]   ? Constipation, mood swings  ? Tolmetin Other (See Comments), Palpitations  ? REACTION: palpitations,  diaphoresis  ? ?  ? ?  ?Medication List  ?  ? ?  ? Accurate as of May 20, 2021 11:59 PM. If you have any questions, ask your nurse or doctor.  ?  ?  ? ?  ? ?Accu-Chek Aviva Plus w/Device Kit ?  ?Accu-Chek Aviva Soln ?  ?Accu-Chek Guide test strip ?Generic drug: glucose blood ?  ?acetaminophen 500 MG tablet ?Commonly known as: TYLENOL ?Take 1,000 mg by mouth every 6 (six) hours as needed for moderate pain or headache. ?  ?albuterol 108 (90 Base) MCG/ACT inhaler ?Commonly known as: VENTOLIN HFA ?Inhale 2 puffs into the lungs every 6 (six) hours as needed for wheezing or shortness of breath. ?  ?amitriptyline 50 MG tablet ?Commonly known as: ELAVIL ?Take 1 tablet (50 mg total) by mouth at bedtime. ?  ?aspirin EC 81 MG tablet ?Take 81 mg by mouth daily. Swallow whole. ?  ?B-D SINGLE USE SWABS REGULAR Pads ?  ?B-D ULTRAFINE III SHORT PEN 31G X 8 MM Misc ?Generic drug: Insulin Pen Needle ?Inject  into the skin. ?  ?buprenorphine 10 MCG/HR Ptwk ?Commonly known as: BUTRANS ?Place onto the skin once a week. ?  ?buprenorphine 20 MCG/HR Ptwk ?Commonly known as: BUTRANS ?Place onto the skin. ?  ?buprenorphine 20 MCG/HR Ptwk ?Commonly known as: BUTRANS ?1 patch to skin ?  ?buprenorphine 20 MCG/HR Ptwk ?Commonly known as: BUTRANS ?1 patch once a week. ?  ?butalbital-acetaminophen-caffeine 50-325-40 MG tablet ?Commonly known as: FIORICET ?  ?carbidopa-levodopa 25-100 MG tablet ?Commonly known as: SINEMET IR ?Take by mouth. ?  ?carbidopa-levodopa 50-200 MG tablet ?Commonly known as: SINEMET CR ?Take 1 tablet by mouth at bedtime. ?  ?diclofenac Sodium 1 % Gel ?Commonly known as: VOLTAREN ?Apply 1 application topically 4 (four) times daily. ?  ?docusate sodium 50 MG capsule ?Commonly known as: COLACE ?Take 100 mg by mouth 2 (two) times daily. ?  ?Lantus SoloStar 100 UNIT/ML Solostar Pen ?Generic drug: insulin glargine ?Inject into the skin. ?  ?levothyroxine 50 MCG tablet ?Commonly known as: SYNTHROID ?Take by mouth. ?  ?metFORMIN  500 MG tablet ?Commonly known as: GLUCOPHAGE ?Take by mouth. ?  ?NovoLIN 70/30 (70-30) 100 UNIT/ML injection ?Generic drug: insulin NPH-regular Human ?Inject 15 units subcutaneously twice daily with meals

## 2021-05-20 ENCOUNTER — Ambulatory Visit (INDEPENDENT_AMBULATORY_CARE_PROVIDER_SITE_OTHER): Payer: Medicare HMO | Admitting: Urology

## 2021-05-20 VITALS — BP 156/100 | HR 102 | Ht 64.0 in | Wt 252.0 lb

## 2021-05-20 DIAGNOSIS — R3129 Other microscopic hematuria: Secondary | ICD-10-CM | POA: Diagnosis not present

## 2021-05-20 DIAGNOSIS — N200 Calculus of kidney: Secondary | ICD-10-CM

## 2021-05-20 DIAGNOSIS — Z87442 Personal history of urinary calculi: Secondary | ICD-10-CM

## 2021-05-20 DIAGNOSIS — R319 Hematuria, unspecified: Secondary | ICD-10-CM

## 2021-05-20 LAB — URINALYSIS, COMPLETE
Bilirubin, UA: NEGATIVE
Leukocytes,UA: NEGATIVE
Nitrite, UA: NEGATIVE
Specific Gravity, UA: >1.03 — ABNORMAL HIGH (ref 1.005–1.030)
Urobilinogen, Ur: 0.2 mg/dL (ref 0.2–1.0)
pH, UA: 5.5 (ref 5.0–7.5)

## 2021-05-20 LAB — MICROSCOPIC EXAMINATION
Bacteria, UA: NONE SEEN
RBC, Urine: >30 /hpf — AB (ref 0–2)

## 2021-05-23 LAB — CULTURE, URINE COMPREHENSIVE

## 2021-05-24 ENCOUNTER — Encounter: Payer: Self-pay | Admitting: Urology

## 2021-05-29 ENCOUNTER — Ambulatory Visit: Payer: Medicare HMO | Admitting: Urology

## 2021-05-29 ENCOUNTER — Encounter: Payer: Self-pay | Admitting: Urology

## 2021-05-29 VITALS — BP 146/84 | HR 112 | Ht 64.0 in | Wt 250.0 lb

## 2021-05-29 DIAGNOSIS — R3129 Other microscopic hematuria: Secondary | ICD-10-CM

## 2021-05-29 DIAGNOSIS — G20 Parkinson's disease: Secondary | ICD-10-CM | POA: Diagnosis not present

## 2021-05-29 NOTE — Progress Notes (Signed)
? ?  05/29/21 ? ?CC:  ?Chief Complaint  ?Patient presents with  ? Cysto  ? ? ?HPI: Refer to Shannon's note 05/20/21 still with lower abdominal discomfort ? ?Blood pressure (!) 146/84, pulse (!) 112, height '5\' 4"'$  (1.626 m), weight 250 lb (113.4 kg). ?NED. A&Ox3.   ?No respiratory distress   ?Abd soft, NT, ND ?Atrophic external genitalia with patent urethral meatus ? ?Cystoscopy Procedure Note ? ?Patient identification was confirmed, informed consent was obtained, and patient was prepped using Betadine solution.  Lidocaine jelly was administered per urethral meatus.   ? ?Procedure: ?- Flexible cystoscope introduced, without any difficulty.   ?- Thorough search of the bladder revealed: ?   normal urethral meatus ?   normal urothelium ?   no stones ?   no ulcers  ?   no tumors ?   no urethral polyps ?   no trabeculation ? ?- Ureteral orifices were normal in position and appearance. ? ?Post-Procedure: ?- Patient tolerated the procedure well ? ?Assessment/ Plan: ?No bladder mucosal abnormalities ?Probable right distal ureteral calculus ?Follow-up with [facility] 3-4 weeks.  If still symptomatic consider ureteroscopy versus repeat noncontrast CT ? ? ? ?Abbie Sons, MD ? ?

## 2021-06-26 ENCOUNTER — Ambulatory Visit: Payer: Medicare HMO | Admitting: Urology

## 2021-07-07 DIAGNOSIS — R635 Abnormal weight gain: Secondary | ICD-10-CM | POA: Diagnosis not present

## 2021-07-07 DIAGNOSIS — Z79899 Other long term (current) drug therapy: Secondary | ICD-10-CM | POA: Diagnosis not present

## 2021-07-07 DIAGNOSIS — G2119 Other drug induced secondary parkinsonism: Secondary | ICD-10-CM | POA: Diagnosis not present

## 2021-07-07 DIAGNOSIS — F2089 Other schizophrenia: Secondary | ICD-10-CM | POA: Diagnosis not present

## 2021-07-07 DIAGNOSIS — I10 Essential (primary) hypertension: Secondary | ICD-10-CM | POA: Diagnosis not present

## 2021-07-07 DIAGNOSIS — E1165 Type 2 diabetes mellitus with hyperglycemia: Secondary | ICD-10-CM | POA: Diagnosis not present

## 2021-07-07 DIAGNOSIS — Z8719 Personal history of other diseases of the digestive system: Secondary | ICD-10-CM | POA: Diagnosis not present

## 2021-07-07 DIAGNOSIS — N898 Other specified noninflammatory disorders of vagina: Secondary | ICD-10-CM | POA: Diagnosis not present

## 2021-07-14 DIAGNOSIS — E1165 Type 2 diabetes mellitus with hyperglycemia: Secondary | ICD-10-CM | POA: Diagnosis not present

## 2021-07-14 DIAGNOSIS — E119 Type 2 diabetes mellitus without complications: Secondary | ICD-10-CM | POA: Diagnosis not present

## 2021-07-14 DIAGNOSIS — F209 Schizophrenia, unspecified: Secondary | ICD-10-CM | POA: Diagnosis not present

## 2021-07-14 DIAGNOSIS — Z6841 Body Mass Index (BMI) 40.0 and over, adult: Secondary | ICD-10-CM | POA: Diagnosis not present

## 2021-07-14 DIAGNOSIS — G2119 Other drug induced secondary parkinsonism: Secondary | ICD-10-CM | POA: Diagnosis not present

## 2021-07-14 DIAGNOSIS — N898 Other specified noninflammatory disorders of vagina: Secondary | ICD-10-CM | POA: Diagnosis not present

## 2021-07-14 DIAGNOSIS — Z794 Long term (current) use of insulin: Secondary | ICD-10-CM | POA: Diagnosis not present

## 2021-07-14 DIAGNOSIS — H60392 Other infective otitis externa, left ear: Secondary | ICD-10-CM | POA: Diagnosis not present

## 2021-07-30 ENCOUNTER — Encounter: Payer: Self-pay | Admitting: Psychiatry

## 2021-07-30 ENCOUNTER — Ambulatory Visit (INDEPENDENT_AMBULATORY_CARE_PROVIDER_SITE_OTHER): Payer: Medicare HMO | Admitting: Psychiatry

## 2021-07-30 VITALS — BP 144/89 | HR 99 | Temp 97.9°F | Wt 256.0 lb

## 2021-07-30 DIAGNOSIS — I10 Essential (primary) hypertension: Secondary | ICD-10-CM | POA: Diagnosis not present

## 2021-07-30 DIAGNOSIS — E1165 Type 2 diabetes mellitus with hyperglycemia: Secondary | ICD-10-CM | POA: Diagnosis not present

## 2021-07-30 DIAGNOSIS — F251 Schizoaffective disorder, depressive type: Secondary | ICD-10-CM | POA: Diagnosis not present

## 2021-07-30 DIAGNOSIS — E1122 Type 2 diabetes mellitus with diabetic chronic kidney disease: Secondary | ICD-10-CM | POA: Diagnosis not present

## 2021-07-30 DIAGNOSIS — G2119 Other drug induced secondary parkinsonism: Secondary | ICD-10-CM | POA: Diagnosis not present

## 2021-07-30 DIAGNOSIS — G4701 Insomnia due to medical condition: Secondary | ICD-10-CM

## 2021-07-30 DIAGNOSIS — F4310 Post-traumatic stress disorder, unspecified: Secondary | ICD-10-CM

## 2021-07-30 DIAGNOSIS — N1831 Chronic kidney disease, stage 3a: Secondary | ICD-10-CM | POA: Diagnosis not present

## 2021-07-30 DIAGNOSIS — F411 Generalized anxiety disorder: Secondary | ICD-10-CM | POA: Diagnosis not present

## 2021-07-30 DIAGNOSIS — Z794 Long term (current) use of insulin: Secondary | ICD-10-CM | POA: Diagnosis not present

## 2021-07-30 DIAGNOSIS — E119 Type 2 diabetes mellitus without complications: Secondary | ICD-10-CM | POA: Diagnosis not present

## 2021-07-30 DIAGNOSIS — E782 Mixed hyperlipidemia: Secondary | ICD-10-CM | POA: Diagnosis not present

## 2021-07-30 MED ORDER — AMITRIPTYLINE HCL 50 MG PO TABS: 75.0000 mg | ORAL_TABLET | Freq: Every day | ORAL | 0 refills | Status: DC

## 2021-07-30 NOTE — Progress Notes (Unsigned)
[facility] OP Progress Note  07/30/2021 1:21 PM Heather Haley  MRN:  [id number]  Chief Complaint:  Chief Complaint  Patient presents with   Follow-up: 61 year old Caucasian female with history of schizoaffective disorder, PTSD, presented for medication management.   HPI: Heather Haley is a 61 year old Caucasian female, lives in [place], has a history of PTSD, schizoaffective disorder, GAD, drug-induced Parkinson's disease, insomnia, headache, memory problems, diabetes mellitus, chronic pain was evaluated in office today.  Patient today reports she is currently struggling with significant anxiety.  She does not like to be in social situations, she became tremulous and anxious when she has to do that.  Patient also reports having anxiety attacks at home.  She reports she has anxiety attacks every other day or so usually triggered by psychosocial stressors.  Patient reports she feels nervous, anxious, restless when she has these attacks and it may last for a few hours.  Patient reports walking away from certain situations helps with her anxiety attacks.  Patient is currently on amitriptyline.  Tolerating the medication well.  Agreeable to dosage increase.  Denies any significant depressive symptoms.  Reports sleep is overall good.  Currently compliant on the olanzapine.  Denies side effects.  Patient currently does not have a psychotherapist, agreeable to establish care.  Denies any suicidality, homicidality or perceptual disturbances.  Patient denies any other concerns today.  Visit Diagnosis:    ICD-10-CM   1. Schizoaffective disorder, depressive type ([place])  F25.1     2. PTSD (post-traumatic stress disorder)  F43.10 amitriptyline (ELAVIL) 50 MG tablet    3. Generalized anxiety disorder  F41.1 amitriptyline (ELAVIL) 50 MG tablet    4. Drug-induced Parkinson's disease ([place])  G21.19     5. Insomnia due to medical condition  G47.01    mood, pain      Past Psychiatric  History: Reviewed past psychiatric history from progress note on 05/25/2017.  Past Medical History:  Past Medical History:  Diagnosis Date   Anxiety    Asthma    Chronic kidney disease    Chronic pain    previously saw Dr. Consuela Mimes in pain clinic, then saw pain specialist in [facility]   Depression    Diabetes mellitus ([place])    Frequency of urination    GERD (gastroesophageal reflux disease)    Headache(784.0)    High cholesterol    History of kidney stones    Hypertension    IBS (irritable bowel syndrome)    Left ankle instability    Left knee DJD    Lumbar Degenerative Disc Disease of  10/11/2014   Neuromuscular disorder (HCC)    Osteoarthritis of hip (Right) 05/05/2015   Other enthesopathy of ankle and tarsus 12/15/2009   Qualifier: Diagnosis of  By: Oneida Alar MD, KARL     Parkinson's disease ([place])    Peripheral sensory neuropathy (Bilateral) 11/19/2014   Postoperative nausea and vomiting    Schizophrenia ([place])     Past Surgical History:  Procedure Laterality Date   ABDOMINAL HYSTERECTOMY     ANKLE SURGERY Left    x 2   APPENDECTOMY     CARDIAC CATHETERIZATION     COLONOSCOPY  2013   COLONOSCOPY WITH PROPOFOL N/A 04/25/2017   Procedure: COLONOSCOPY WITH PROPOFOL;  Surgeon: Manya Silvas, MD;  Location: [facility] ENDOSCOPY;  Service: Endoscopy;  Laterality: N/A;   CYSTOSCOPY/URETEROSCOPY/HOLMIUM LASER/STENT PLACEMENT Left 09/11/2019   Procedure: CYSTOSCOPY/URETEROSCOPY/HOLMIUM LASER/STENT PLACEMENT;  Surgeon: Abbie Sons, MD;  Location: [facility] ORS;  Service:  Urology;  Laterality: Left;   ESOPHAGOGASTRODUODENOSCOPY (EGD) WITH PROPOFOL N/A 10/03/2014   Procedure: ESOPHAGOGASTRODUODENOSCOPY (EGD) WITH PROPOFOL;  Surgeon: Josefine Class, MD;  Location[facility] ENDOSCOPY;  Service: Endoscopy;  Laterality: N/A;   ESOPHAGOGASTRODUODENOSCOPY (EGD) WITH PROPOFOL  04/25/2017   Procedure: ESOPHAGOGASTRODUODENOSCOPY (EGD) WITH PROPOFOL;  Surgeon: Manya Silvas, MD;  Location: [facility]  ENDOSCOPY;  Service: Endoscopy;;   JOINT REPLACEMENT Left    knee   KNEE ARTHROSCOPY  1997   left knee   LEFT HEART CATH AND CORONARY ANGIOGRAPHY Left 11/10/2017   Procedure: LEFT HEART CATH AND CORONARY ANGIOGRAPHY;  Surgeon: Isaias Cowman, MD;  Location: [place] CV LAB;  Service: Cardiovascular;  Laterality: Left;   LEFT HEART CATHETERIZATION WITH CORONARY ANGIOGRAM N/A 01/11/2013   Procedure: LEFT HEART CATHETERIZATION WITH CORONARY ANGIOGRAM;  Surgeon: Sinclair Grooms, MD;  Location: [facility] CATH LAB;  Service: Cardiovascular;  Laterality: N/A;   TOTAL KNEE ARTHROPLASTY  08/30/2011   Procedure: TOTAL KNEE ARTHROPLASTY;  Surgeon: Lorn Junes, MD;  Location: [place];  Service: Orthopedics;  Laterality: Left;    Family Psychiatric History: Reviewed family psychiatric history from progress note on 05/25/2017.  Family History:  Family History  Problem Relation Age of Onset   Cancer Mother    Hypertension Father    Heart disease Father    Alcohol abuse Father    Breast cancer Neg Hx     Social History: Reviewed social history from progress note on 05/25/2017. Social History   Socioeconomic History   Marital status: Divorced    Spouse name: Not on file   Number of children: 2   Years of education: 11   Highest education level: 11th grade  Occupational History    Employer: [employer]  Tobacco Use   Smoking status: Former    Packs/day: 2.00    Years: 15.00    Total pack years: 30.00    Types: Cigarettes    Quit date: 02/09/1995    Years since quitting: 26.4   Smokeless tobacco: Never  Vaping Use   Vaping Use: Never used  Substance and Sexual Activity   Alcohol use: No    Alcohol/week: 0.0 standard drinks of alcohol   Drug use: No   Sexual activity: Not Currently  Other Topics Concern   Not on file  Social History Narrative   Patient lives at home alone. Patient works at [employer].   Caffeine daily- 2   Right handed.   Education- 11 th grade   Social  Determinants of Health   Financial Resource Strain: Medium Risk (03/24/2017)   Overall Financial Resource Strain (CARDIA)    Difficulty of Paying Living Expenses: Somewhat hard  Food Insecurity: No Food Insecurity (03/24/2017)   Hunger Vital Sign    Worried About Running Out of Food in the Last Year: Never true    Ran Out of Food in the Last Year: Never true  Transportation Needs: No Transportation Needs (03/24/2017)   PRAPARE - Hydrologist (Medical): No    Lack of Transportation (Non-Medical): No  Physical Activity: Inactive (03/24/2017)   Exercise Vital Sign    Days of Exercise per Week: 0 days    Minutes of Exercise per Session: 0 min  Stress: Stress Concern Present (03/24/2017)   [place]    Feeling of Stress : Rather much  Social Connections: Moderately Isolated (03/24/2017)   Social Connection and Isolation Panel [NHANES]  Frequency of Communication with Friends and Family: More than three times a week    Frequency of Social Gatherings with Friends and Family: Once a week    Attends Religious Services: Never    Marine scientist or Organizations: No    Attends Archivist Meetings: Never    Marital Status: Divorced    Allergies:  Allergies  Allergen Reactions   Prolixin Decanoate [Fluphenazine]     Ineffective    Benztropine Other (See Comments)    Hair fall out  Suicide thoughts   Benztropine Mesylate Other (See Comments)   Buprenorphine Hcl Other (See Comments)    Unable to void   Carbidopa-Levodopa Nausea Only   Codeine Hives   Cymbalta [Duloxetine Hcl] Other (See Comments)    Altered mental status, Alopecia, visual hallucinations, nightmares   Gabapentin Swelling   Lyrica [Pregabalin] Other (See Comments)    Alopecia, visual hallucinations, nightmares Altered mental status   Morphine And Related Other (See Comments)    Unable to void   Nortripytline  Hcl [Nortriptyline] Other (See Comments)    Hair loss and night mares    Nsaids     REACTION: palpitations, diaphoresis   Penicillins Nausea And Vomiting and Other (See Comments)    REACTION: upset stomach Has patient had a PCN reaction causing immediate rash, facial/tongue/throat swelling, SOB or lightheadedness with hypotension: No Has patient had a PCN reaction causing severe rash involving mucus membranes or skin necrosis: No Has patient had a PCN reaction that required hospitalization: No Has patient had a PCN reaction occurring within the last 10 years: No If all of the above answers are "NO", then may proceed with Cephalosporin use.   Risperidone And Related Other (See Comments)    tremors   Wellbutrin [Bupropion]     Constipation, mood swings   Tolmetin Other (See Comments) and Palpitations    REACTION: palpitations, diaphoresis    Metabolic Disorder Labs: Lab Results  Component Value Date   HGBA1C 6.2 (H) 03/06/2017   MPG 131.24 03/06/2017   MPG 108 05/17/2015   Lab Results  Component Value Date   PROLACTIN 4.5 (L) 06/13/2020   PROLACTIN 64.2 (H) 03/06/2017   Lab Results  Component Value Date   CHOL 146 08/22/2018   TRIG 172 (H) 08/22/2018   HDL 49 08/22/2018   CHOLHDL 3.0 08/22/2018   VLDL 34 08/22/2018   LDLCALC 63 08/22/2018   LDLCALC 59 03/06/2017   Lab Results  Component Value Date   TSH 0.959 08/22/2018   TSH 2.766 08/24/2017    Therapeutic Level Labs: No results found for: "LITHIUM" No results found for: "VALPROATE" No results found for: "CBMZ"  Current Medications: Current Outpatient Medications  Medication Sig Dispense Refill   ACCU-CHEK GUIDE test strip      acetaminophen (TYLENOL) 500 MG tablet Take 1,000 mg by mouth every 6 (six) hours as needed for moderate pain or headache.      albuterol (PROVENTIL HFA;VENTOLIN HFA) 108 (90 Base) MCG/ACT inhaler Inhale 2 puffs into the lungs every 6 (six) hours as needed for wheezing or shortness of  breath.     Alcohol Swabs (B-D SINGLE USE SWABS REGULAR) PADS      aspirin EC 81 MG tablet Take 81 mg by mouth daily. Swallow whole.     B-D ULTRAFINE III SHORT PEN 31G X 8 MM MISC Inject into the skin.     Blood Glucose Calibration (ACCU-CHEK AVIVA) SOLN      Blood Glucose Monitoring  Suppl (ACCU-CHEK AVIVA PLUS) w/Device KIT      buprenorphine (BUTRANS) 10 MCG/HR PTWK Place onto the skin once a week.     buprenorphine (BUTRANS) 20 MCG/HR PTWK 1 patch once a week.     buprenorphine (BUTRANS) 20 MCG/HR PTWK 1 patch to skin     buprenorphine (BUTRANS) 20 MCG/HR PTWK Place onto the skin.     butalbital-acetaminophen-caffeine (FIORICET) 50-325-40 MG tablet      carbidopa-levodopa (SINEMET CR) 50-200 MG tablet Take 1 tablet by mouth at bedtime.     Cholecalciferol (VITAMIN D-3) 125 MCG (5000 UT) TABS Take 5,000 Units by mouth daily.     docusate sodium (COLACE) 50 MG capsule Take 100 mg by mouth 2 (two) times daily.     insulin glargine (LANTUS SOLOSTAR) 100 UNIT/ML Solostar Pen Inject into the skin.     insulin NPH-regular Human (NOVOLIN 70/30) (70-30) 100 UNIT/ML injection Inject 15 units subcutaneously twice daily with meals     metFORMIN (GLUCOPHAGE) 500 MG tablet Take by mouth.     ofloxacin (FLOXIN) 0.3 % OTIC solution Place in ear(s).     OLANZapine (ZYPREXA) 5 MG tablet Take 1 tablet (5 mg total) by mouth at bedtime. Take 1 tablet daily at bedtime. 90 tablet 2   pravastatin (PRAVACHOL) 40 MG tablet Take by mouth.     QULIPTA 60 MG TABS      RELION INSULIN SYRINGE 31G X 15/64" 1 ML MISC      rizatriptan (MAXALT) 10 MG tablet Take 10 mg by mouth 2 (two) times daily as needed for migraine. May repeat in 2 hours if needed     rOPINIRole (REQUIP) 0.5 MG tablet Take by mouth.     TRULICITY 1.5 WI/2.0BT SOPN Inject into the skin.     verapamil (CALAN-SR) 120 MG CR tablet Take 120 mg by mouth daily.     amitriptyline (ELAVIL) 50 MG tablet Take 1.5 tablets (75 mg total) by mouth at bedtime. 135  tablet 0   clindamycin (CLEOCIN) 300 MG capsule Take 300 mg by mouth 2 (two) times daily.     levothyroxine (SYNTHROID) 50 MCG tablet Take by mouth.     montelukast (SINGULAIR) 10 MG tablet Oral for 90     tamsulosin (FLOMAX) 0.4 MG CAPS capsule Oral for 90     torsemide (DEMADEX) 20 MG tablet Oral for 90     No current facility-administered medications for this visit.     Musculoskeletal: Strength & Muscle Tone: within normal limits Gait & Station:  walks with cane Patient leans: N/A  Psychiatric Specialty Exam: Review of Systems  Neurological:  Positive for tremors.  Psychiatric/Behavioral:  The patient is nervous/anxious.   All other systems reviewed and are negative.   Blood pressure (!) 144/89, pulse 99, temperature 97.9 F (36.6 C), temperature source Temporal, weight 256 lb (116.1 kg).Body mass index is 43.94 kg/m.  General Appearance: Casual  Eye Contact:  Fair  Speech:  Clear and Coherent  Volume:  Normal  Mood:  Anxious  Affect:  Congruent  Thought Process:  Goal Directed and Descriptions of Associations: Intact  Orientation:  Full (Time, Place, and Person)  Thought Content: Logical   Suicidal Thoughts:  No  Homicidal Thoughts:  No  Memory:  Immediate;   Fair Recent;   Fair Remote;   limited  Judgement:  Fair  Insight:  Fair  Psychomotor Activity:  Tremor, chronic , improved  Concentration:  Concentration: Fair and Attention Span: Fair  Recall:  Fair  Fund of Knowledge: Fair  Language: Fair  Akathisia:  No  Handed:  Right  AIMS (if indicated): done  Assets:  Communication Skills Desire for Improvement Housing Transportation  ADL's:  Intact  Cognition: WNL  Sleep:  Fair   Screenings: [place] Office Visit from 07/30/2021 in [place] Office Visit from 04/29/2021 in [place] Office Visit from 02/25/2021 in [place] Office Visit from 01/28/2021 in  [place] Visit from 12/29/2020 in [place] Total Score 0 0 0 3 4      AUDIT    Flowsheet Row Admission (Discharged) from OP Visit from 08/30/2018 in [place] Admission (Discharged) from 08/22/2018 in [place] [facility] Admission (Discharged) from 03/03/2017 in [place] [facility]  Alcohol Use Disorder Identification Test Final Score (AUDIT) 0 0 0      GAD-7    Flowsheet Row Office Visit from 11/13/2020 in [place] Office Visit from 10/14/2020 in [place] Visit from 08/19/2020 in [place] Visit from 08/22/2015 in [place] at [facility]  Total GAD-7 Score _0 [facility]    [place] Visit from 07/30/2021 in [place] Office Visit from 04/29/2021 in [place] Office Visit from 01/28/2021 in [place] Visit from 12/29/2020 in [place] Visit from 12/08/2020 in [place]  PHQ-2 Total Score 0 _1 PHQ-9 Total Score -- _2 [place] Office Visit from 01/28/2021 in [place] Visit from 12/29/2020 in [place] Office Visit from 06/13/2020 in [place] No Risk No Risk Error: Question 6 not populated        Assessment and Plan: Heather Haley is a 61 year old Caucasian female who has a history of schizoaffective disorder, PTSD, migraine headaches, drug-induced Parkinson's disease, was evaluated in office today.  Patient currently struggling with anxiety, will benefit from the following  plan.  Plan Schizoaffective disorder-stable Zyprexa 5 mg p.o. nightly  GAD-unstable Increase Elavil to 75 mg p.o. nightly Patient failed trials of medications like propranolol, hydroxyzine-developed side effects. Patient will benefit from psychotherapy sessions-provided information for therapist in the community.  PTSD-stable Elavil as prescribed  Insomnia-stable Elavil as prescribed  Drug-induced Parkinson's disease-stable Continue carbidopa-levodopa per primary care provider/neurologist.  Follow-up in clinic in 3 to 4 weeks or sooner if needed.  This note was generated in part or whole with voice recognition software. Voice recognition is usually quite accurate but there are transcription errors that can and very often do occur. I apologize for any typographical errors that were not detected and corrected.    This note was generated in part or whole with voice recognition software. Voice recognition is usually quite accurate but there are transcription errors that can and very often do occur. I apologize for any typographical errors that were not detected and corrected.    Ursula Alert, MD 07/30/2021, 1:21 PM

## 2021-07-30 NOTE — Patient Instructions (Signed)
[facility]  [address], [place], [place] [address]  Phone: [phone]

## 2021-08-28 ENCOUNTER — Encounter: Payer: Self-pay | Admitting: Psychiatry

## 2021-08-28 ENCOUNTER — Ambulatory Visit: Payer: Medicare HMO | Admitting: Psychiatry

## 2021-08-28 VITALS — BP 114/84 | HR 108 | Temp 97.3°F | Wt 248.2 lb

## 2021-08-28 DIAGNOSIS — G2119 Other drug induced secondary parkinsonism: Secondary | ICD-10-CM

## 2021-08-28 DIAGNOSIS — G4701 Insomnia due to medical condition: Secondary | ICD-10-CM | POA: Diagnosis not present

## 2021-08-28 DIAGNOSIS — F411 Generalized anxiety disorder: Secondary | ICD-10-CM | POA: Diagnosis not present

## 2021-08-28 DIAGNOSIS — F4310 Post-traumatic stress disorder, unspecified: Secondary | ICD-10-CM

## 2021-08-28 DIAGNOSIS — F251 Schizoaffective disorder, depressive type: Secondary | ICD-10-CM | POA: Diagnosis not present

## 2021-08-28 MED ORDER — OLANZAPINE 5 MG PO TABS: 5.0000 mg | ORAL_TABLET | Freq: Every day | ORAL | 2 refills | Status: DC

## 2021-08-28 NOTE — Progress Notes (Signed)
[place] MD OP Progress Note  08/28/2021 10:57 AM Heather Haley  MRN:  [id number]  Chief Complaint:  Chief Complaint  Patient presents with   Follow-up: 61 year old Caucasian female with history of schizoaffective disorder, PTSD, presented for medication management.   HPI: Heather Haley is a 61 year old Caucasian female, lives in [place], has a history of PTSD, schizoaffective disorder, GAD, drug-induced Parkinson's disease, insomnia, headache, memory problems, diabetes mellitus, chronic pain was evaluated in office today.  Patient today reports since being on the higher dosage of the Elavil she has been feeling better.  Her anxiety has improved a lot.  She also reports sleep was improved.  She denies any side effects to the Elavil.  Continues to be compliant on the olanzapine.  Denies any side effects.  Does report sleeping excessively during the day mostly because of boredom.  She reports she does not have much to do during the day.  She cannot walk too long because of her history of knee surgery, knee pain.  She walks with the assistance of a cane, short distances.  She reports she is trying to get a wheelchair, electric, so she can go long distance.  Patient denies any suicidality, homicidality or perceptual disturbances.  Patient denies any other concerns today.  Visit Diagnosis:    ICD-10-CM   1. Schizoaffective disorder, depressive type ([place])  F25.1 OLANZapine (ZYPREXA) 5 MG tablet    2. PTSD (post-traumatic stress disorder)  F43.10     3. Generalized anxiety disorder  F41.1     4. Drug-induced Parkinson's disease ([place])  G21.19     5. Insomnia due to medical condition  G47.01    mood, pain      Past Psychiatric History: Reviewed past psychiatric history from progress note on 05/25/2017.  Past Medical History:  Past Medical History:  Diagnosis Date   Anxiety    Asthma    Chronic kidney disease    Chronic pain    previously saw Dr. Consuela Mimes in pain clinic,  then saw pain specialist in [facility]   Depression    Diabetes mellitus ([place])    Frequency of urination    GERD (gastroesophageal reflux disease)    Headache(784.0)    High cholesterol    History of kidney stones    Hypertension    IBS (irritable bowel syndrome)    Left ankle instability    Left knee DJD    Lumbar Degenerative Disc Disease of  10/11/2014   Neuromuscular disorder (HCC)    Osteoarthritis of hip (Right) 05/05/2015   Other enthesopathy of ankle and tarsus 12/15/2009   Qualifier: Diagnosis of  By: Oneida Alar MD, KARL     Parkinson's disease ([place])    Peripheral sensory neuropathy (Bilateral) 11/19/2014   Postoperative nausea and vomiting    Schizophrenia ([place])     Past Surgical History:  Procedure Laterality Date   ABDOMINAL HYSTERECTOMY     ANKLE SURGERY Left    x 2   APPENDECTOMY     CARDIAC CATHETERIZATION     COLONOSCOPY  2013   COLONOSCOPY WITH PROPOFOL N/A 04/25/2017   Procedure: COLONOSCOPY WITH PROPOFOL;  Surgeon: Manya Silvas, MD;  Location: [facility] ENDOSCOPY;  Service: Endoscopy;  Laterality: N/A;   CYSTOSCOPY/URETEROSCOPY/HOLMIUM LASER/STENT PLACEMENT Left 09/11/2019   Procedure: CYSTOSCOPY/URETEROSCOPY/HOLMIUM LASER/STENT PLACEMENT;  Surgeon: Abbie Sons, MD;  Location: [facility] ORS;  Service: Urology;  Laterality: Left;   ESOPHAGOGASTRODUODENOSCOPY (EGD) WITH PROPOFOL N/A 10/03/2014   Procedure: ESOPHAGOGASTRODUODENOSCOPY (EGD) WITH PROPOFOL;  Surgeon: Grace Blight  Rayann Heman, MD;  Location: [facility] ENDOSCOPY;  Service: Endoscopy;  Laterality: N/A;   ESOPHAGOGASTRODUODENOSCOPY (EGD) WITH PROPOFOL  04/25/2017   Procedure: ESOPHAGOGASTRODUODENOSCOPY (EGD) WITH PROPOFOL;  Surgeon: Manya Silvas, MD;  Location: [facility] ENDOSCOPY;  Service: Endoscopy;;   JOINT REPLACEMENT Left    knee   KNEE ARTHROSCOPY  1997   left knee   LEFT HEART CATH AND CORONARY ANGIOGRAPHY Left 11/10/2017   Procedure: LEFT HEART CATH AND CORONARY ANGIOGRAPHY;  Surgeon: Isaias Cowman, MD;   Location: [place] [facility] CV LAB;  Service: Cardiovascular;  Laterality: Left;   LEFT HEART CATHETERIZATION WITH CORONARY ANGIOGRAM N/A 01/11/2013   Procedure: LEFT HEART CATHETERIZATION WITH CORONARY ANGIOGRAM;  Surgeon: Sinclair Grooms, MD;  Location: [facility] CATH LAB;  Service: Cardiovascular;  Laterality: N/A;   TOTAL KNEE ARTHROPLASTY  08/30/2011   Procedure: TOTAL KNEE ARTHROPLASTY;  Surgeon: Lorn Junes, MD;  Location: [facility];  Service: Orthopedics;  Laterality: Left;    Family Psychiatric History: Reviewed family psychiatric history from progress note on 05/25/2017.  Family History:  Family History  Problem Relation Age of Onset   Cancer Mother    Hypertension Father    Heart disease Father    Alcohol abuse Father    Breast cancer Neg Hx     Social History: Reviewed social history from progress note on 05/25/2017. Social History   Socioeconomic History   Marital status: Divorced    Spouse name: Not on file   Number of children: 2   Years of education: 11   Highest education level: 11th grade  Occupational History    Employer: [employer]  Tobacco Use   Smoking status: Former    Packs/day: 2.00    Years: 15.00    Total pack years: 30.00    Types: Cigarettes    Quit date: 02/09/1995    Years since quitting: 26.5   Smokeless tobacco: Never  Vaping Use   Vaping Use: Never used  Substance and Sexual Activity   Alcohol use: No    Alcohol/week: 0.0 standard drinks of alcohol   Drug use: No   Sexual activity: Not Currently  Other Topics Concern   Not on file  Social History Narrative   Patient lives at home alone. Patient works at [employer].   Caffeine daily- 2   Right handed.   Education- 11 th grade   Social Determinants of Health   Financial Resource Strain: Medium Risk (03/24/2017)   Overall Financial Resource Strain (CARDIA)    Difficulty of Paying Living Expenses: Somewhat hard  Food Insecurity: No Food Insecurity (03/24/2017)   Hunger Vital Sign     Worried About Running Out of Food in the Last Year: Never true    Ran Out of Food in the Last Year: Never true  Transportation Needs: No Transportation Needs (03/24/2017)   PRAPARE - Hydrologist (Medical): No    Lack of Transportation (Non-Medical): No  Physical Activity: Inactive (03/24/2017)   Exercise Vital Sign    Days of Exercise per Week: 0 days    Minutes of Exercise per Session: 0 min  Stress: Stress Concern Present (03/24/2017)   [place]    Feeling of Stress : Rather much  Social Connections: Moderately Isolated (03/24/2017)   Social Connection and Isolation Panel [NHANES]    Frequency of Communication with Friends and Family: More than three times a week    Frequency of Social Gatherings with Friends  and Family: Once a week    Attends Religious Services: Never    Active Member of Clubs or Organizations: No    Attends Archivist Meetings: Never    Marital Status: Divorced    Allergies:  Allergies  Allergen Reactions   Prolixin Decanoate [Fluphenazine]     Ineffective    Benztropine Other (See Comments)    Hair fall out  Suicide thoughts   Benztropine Mesylate Other (See Comments)   Buprenorphine Hcl Other (See Comments)    Unable to void   Carbidopa-Levodopa Nausea Only   Codeine Hives   Cymbalta [Duloxetine Hcl] Other (See Comments)    Altered mental status, Alopecia, visual hallucinations, nightmares   Gabapentin Swelling   Lyrica [Pregabalin] Other (See Comments)    Alopecia, visual hallucinations, nightmares Altered mental status   Morphine And Related Other (See Comments)    Unable to void   Nortripytline Hcl [Nortriptyline] Other (See Comments)    Hair loss and night mares    Nsaids     REACTION: palpitations, diaphoresis   Penicillins Nausea And Vomiting and Other (See Comments)    REACTION: upset stomach Has patient had a PCN reaction causing  immediate rash, facial/tongue/throat swelling, SOB or lightheadedness with hypotension: No Has patient had a PCN reaction causing severe rash involving mucus membranes or skin necrosis: No Has patient had a PCN reaction that required hospitalization: No Has patient had a PCN reaction occurring within the last 10 years: No If all of the above answers are "NO", then may proceed with Cephalosporin use.   Risperidone And Related Other (See Comments)    tremors   Wellbutrin [Bupropion]     Constipation, mood swings   Tolmetin Other (See Comments) and Palpitations    REACTION: palpitations, diaphoresis    Metabolic Disorder Labs: Lab Results  Component Value Date   HGBA1C 6.2 (H) 03/06/2017   MPG 131.24 03/06/2017   MPG 108 05/17/2015   Lab Results  Component Value Date   PROLACTIN 4.5 (L) 06/13/2020   PROLACTIN 64.2 (H) 03/06/2017   Lab Results  Component Value Date   CHOL 146 08/22/2018   TRIG 172 (H) 08/22/2018   HDL 49 08/22/2018   CHOLHDL 3.0 08/22/2018   VLDL 34 08/22/2018   LDLCALC 63 08/22/2018   LDLCALC 59 03/06/2017   Lab Results  Component Value Date   TSH 0.959 08/22/2018   TSH 2.766 08/24/2017    Therapeutic Level Labs: No results found for: "LITHIUM" No results found for: "VALPROATE" No results found for: "CBMZ"  Current Medications: Current Outpatient Medications  Medication Sig Dispense Refill   ACCU-CHEK GUIDE test strip      acetaminophen (TYLENOL) 500 MG tablet Take 1,000 mg by mouth every 6 (six) hours as needed for moderate pain or headache.      albuterol (PROVENTIL HFA;VENTOLIN HFA) 108 (90 Base) MCG/ACT inhaler Inhale 2 puffs into the lungs every 6 (six) hours as needed for wheezing or shortness of breath.     Alcohol Swabs (B-D SINGLE USE SWABS REGULAR) PADS      amitriptyline (ELAVIL) 50 MG tablet Take 1.5 tablets (75 mg total) by mouth at bedtime. 135 tablet 0   aspirin EC 81 MG tablet Take 81 mg by mouth daily. Swallow whole.     B-D  ULTRAFINE III SHORT PEN 31G X 8 MM MISC Inject into the skin.     Blood Glucose Calibration (ACCU-CHEK AVIVA) SOLN      Blood Glucose Monitoring Suppl (ACCU-CHEK  AVIVA PLUS) w/Device KIT      buprenorphine (BUTRANS) 20 MCG/HR PTWK Place onto the skin.     butalbital-acetaminophen-caffeine (FIORICET) 50-325-40 MG tablet      carbidopa-levodopa (SINEMET CR) 50-200 MG tablet Take 1 tablet by mouth at bedtime.     Cholecalciferol (VITAMIN D-3) 125 MCG (5000 UT) TABS Take 5,000 Units by mouth daily.     insulin glargine (LANTUS SOLOSTAR) 100 UNIT/ML Solostar Pen Inject into the skin.     insulin NPH-regular Human (NOVOLIN 70/30) (70-30) 100 UNIT/ML injection Inject 15 units subcutaneously twice daily with meals     metFORMIN (GLUCOPHAGE) 500 MG tablet Take by mouth.     montelukast (SINGULAIR) 10 MG tablet Take 10 mg by mouth at bedtime.     ofloxacin (FLOXIN) 0.3 % OTIC solution Place in ear(s).     Omega-3 Fatty Acids (FISH OIL) 1000 MG CAPS Take by mouth.     omeprazole (PRILOSEC) 40 MG capsule      pravastatin (PRAVACHOL) 40 MG tablet Take by mouth.     QULIPTA 60 MG TABS      RELION INSULIN SYRINGE 31G X 15/64" 1 ML MISC      rizatriptan (MAXALT) 10 MG tablet Take 10 mg by mouth 2 (two) times daily as needed for migraine. May repeat in 2 hours if needed     torsemide (DEMADEX) 20 MG tablet Take 20 mg by mouth daily.     verapamil (CALAN-SR) 120 MG CR tablet Take 120 mg by mouth at bedtime.     levothyroxine (SYNTHROID) 50 MCG tablet Take by mouth.     OLANZapine (ZYPREXA) 5 MG tablet Take 1 tablet (5 mg total) by mouth at bedtime. Take 1 tablet daily at bedtime.has supplies 90 tablet 2   No current facility-administered medications for this visit.     Musculoskeletal: Strength & Muscle Tone: within normal limits Gait & Station:  walks with help of cane Patient leans: N/A  Psychiatric Specialty Exam: Review of Systems  Musculoskeletal:        Left knee stiffness- chronic   Psychiatric/Behavioral: Negative.    All other systems reviewed and are negative.   Blood pressure 114/84, pulse (!) 108, temperature (!) 97.3 F (36.3 C), temperature source Temporal, weight 248 lb 3.2 oz (112.6 kg).Body mass index is 42.6 kg/m.  General Appearance: Casual  Eye Contact:  Fair  Speech:  Clear and Coherent  Volume:  Normal  Mood:  Euthymic  Affect:  Congruent  Thought Process:  Goal Directed and Descriptions of Associations: Intact  Orientation:  Full (Time, Place, and Person)  Thought Content: Logical   Suicidal Thoughts:  No  Homicidal Thoughts:  No  Memory:  Immediate;   Fair Recent;   Fair Remote;   limited  Judgement:  Fair  Insight:  Fair  Psychomotor Activity:  Normal  Concentration:  Concentration: Fair and Attention Span: Fair  Recall:  [employer] of Knowledge: Fair  Language: Fair  Akathisia:  No  Handed:  Right  AIMS (if indicated): done  Assets:  Communication Skills Desire for Improvement Housing Social Support  ADL's:  Intact  Cognition: WNL  Sleep:  Fair   Screenings: [place] Office Visit from 08/28/2021 in [place] Office Visit from 07/30/2021 in [facility] Office Visit from 04/29/2021 in [facility] Visit from 02/25/2021 in [place] Office Visit from 01/28/2021 in [place]  Total Score 0 0 0 0 3      AUDIT    Flowsheet Row Admission (Discharged) from OP Visit from 08/30/2018 in [place] Admission (Discharged) from 08/22/2018 in [place] [facility] Admission (Discharged) from 03/03/2017 in [facility] [facility]  Alcohol Use Disorder Identification Test Final Score (AUDIT) 0 0 0      GAD-7    Flowsheet Row Office Visit from 08/28/2021 in [place] Office  Visit from 11/13/2020 in [place] Office Visit from 10/14/2020 in [place] Visit from 08/19/2020 in [place] Office Visit from 08/22/2015 in [place] at [facility]  Total GAD-7 Score '5 3 5 5 6      ' PHQ2-9    [place] Visit from 08/28/2021 in [place] Office Visit from 07/30/2021 in [place] Office Visit from 04/29/2021 in [place] Office Visit from 01/28/2021 in [place] Office Visit from 12/29/2020 in [place]  PHQ-2 Total Score 0 0 '2 1 1  ' PHQ-9 Total Score -- -- '7 8 7      ' [place] Office Visit from 08/28/2021 in [place] Office Visit from 07/30/2021 in [place] Office Visit from 01/28/2021 in [place] No Risk No Risk No Risk        Assessment and Plan: BRYNN MULGREW is a 61 year old Caucasian female who has a history of schizoaffective disorder, PTSD, migraine headaches, drug-induced Parkinson's disease was evaluated in office today.  Patient is currently improving.  Plan as noted below.  Plan Schizoaffective disorder-stable Zyprexa 5 mg p.o. nightly  GAD-improving Elavil 75 mg p.o. nightly Failed trials of medications like propranolol, hydroxyzine. Patient was provided information for [facility] last visit.  She reports she has been trying to establish care. Also provided her with information for PACE program today.  Patient will benefit from being in a structured environment.  PTSD-stable Elavil as prescribed  Drug-induced Parkinson's disease-stable Continue carbidopa/levodopa per primary care provider/neurology.  Insomnia-stable Elavil as prescribed  Patient  with tachycardia, elevated heart rate reading in session today, repeat reading is down trending.  Patient to monitor and follow up with primary care provider as needed.  Follow-up in clinic in 3 months or sooner if needed.   This note was generated in part or whole with voice recognition software. Voice recognition is usually quite accurate but there are transcription errors that can and very often do occur. I apologize for any typographical errors that were not detected and corrected.    Ursula Alert, MD 08/28/2021, 10:57 AM

## 2021-08-28 NOTE — Patient Instructions (Signed)
[email or web address] [address] [place], [place] [address]  [phone]  Toll free: [phone] TTY: [phone] Fax: [phone]

## 2021-09-30 ENCOUNTER — Other Ambulatory Visit: Payer: Self-pay | Admitting: Psychiatry

## 2021-09-30 DIAGNOSIS — F411 Generalized anxiety disorder: Secondary | ICD-10-CM

## 2021-09-30 DIAGNOSIS — F4310 Post-traumatic stress disorder, unspecified: Secondary | ICD-10-CM

## 2021-11-05 DIAGNOSIS — E1165 Type 2 diabetes mellitus with hyperglycemia: Secondary | ICD-10-CM | POA: Diagnosis not present

## 2021-11-05 DIAGNOSIS — Z794 Long term (current) use of insulin: Secondary | ICD-10-CM | POA: Diagnosis not present

## 2021-11-05 DIAGNOSIS — I10 Essential (primary) hypertension: Secondary | ICD-10-CM | POA: Diagnosis not present

## 2021-11-05 DIAGNOSIS — E782 Mixed hyperlipidemia: Secondary | ICD-10-CM | POA: Diagnosis not present

## 2021-11-05 DIAGNOSIS — E1122 Type 2 diabetes mellitus with diabetic chronic kidney disease: Secondary | ICD-10-CM | POA: Diagnosis not present

## 2021-11-05 DIAGNOSIS — N1831 Chronic kidney disease, stage 3a: Secondary | ICD-10-CM | POA: Diagnosis not present

## 2021-11-11 DIAGNOSIS — N898 Other specified noninflammatory disorders of vagina: Secondary | ICD-10-CM | POA: Diagnosis not present

## 2021-11-11 DIAGNOSIS — H9202 Otalgia, left ear: Secondary | ICD-10-CM | POA: Diagnosis not present

## 2021-11-11 DIAGNOSIS — E1165 Type 2 diabetes mellitus with hyperglycemia: Secondary | ICD-10-CM | POA: Diagnosis not present

## 2021-11-11 DIAGNOSIS — H60392 Other infective otitis externa, left ear: Secondary | ICD-10-CM | POA: Diagnosis not present

## 2021-11-11 DIAGNOSIS — Z6841 Body Mass Index (BMI) 40.0 and over, adult: Secondary | ICD-10-CM | POA: Diagnosis not present

## 2021-11-11 DIAGNOSIS — G2119 Other drug induced secondary parkinsonism: Secondary | ICD-10-CM | POA: Diagnosis not present

## 2021-11-11 DIAGNOSIS — F4310 Post-traumatic stress disorder, unspecified: Secondary | ICD-10-CM | POA: Diagnosis not present

## 2021-11-18 ENCOUNTER — Other Ambulatory Visit: Payer: Self-pay | Admitting: Internal Medicine

## 2021-11-18 DIAGNOSIS — Z6841 Body Mass Index (BMI) 40.0 and over, adult: Secondary | ICD-10-CM | POA: Diagnosis not present

## 2021-11-18 DIAGNOSIS — E1165 Type 2 diabetes mellitus with hyperglycemia: Secondary | ICD-10-CM | POA: Diagnosis not present

## 2021-11-18 DIAGNOSIS — Z23 Encounter for immunization: Secondary | ICD-10-CM | POA: Diagnosis not present

## 2021-11-18 DIAGNOSIS — M545 Low back pain, unspecified: Secondary | ICD-10-CM | POA: Diagnosis not present

## 2021-11-18 DIAGNOSIS — Z1231 Encounter for screening mammogram for malignant neoplasm of breast: Secondary | ICD-10-CM

## 2021-11-18 DIAGNOSIS — J4530 Mild persistent asthma, uncomplicated: Secondary | ICD-10-CM | POA: Diagnosis not present

## 2021-11-18 DIAGNOSIS — G2119 Other drug induced secondary parkinsonism: Secondary | ICD-10-CM | POA: Diagnosis not present

## 2021-11-18 DIAGNOSIS — I10 Essential (primary) hypertension: Secondary | ICD-10-CM | POA: Diagnosis not present

## 2021-11-23 DIAGNOSIS — G2119 Other drug induced secondary parkinsonism: Secondary | ICD-10-CM | POA: Diagnosis not present

## 2021-11-23 DIAGNOSIS — G43719 Chronic migraine without aura, intractable, without status migrainosus: Secondary | ICD-10-CM | POA: Diagnosis not present

## 2021-11-23 DIAGNOSIS — M5481 Occipital neuralgia: Secondary | ICD-10-CM | POA: Diagnosis not present

## 2021-11-26 DIAGNOSIS — M542 Cervicalgia: Secondary | ICD-10-CM | POA: Diagnosis not present

## 2021-11-26 DIAGNOSIS — M7918 Myalgia, other site: Secondary | ICD-10-CM | POA: Diagnosis not present

## 2021-11-26 DIAGNOSIS — M5481 Occipital neuralgia: Secondary | ICD-10-CM | POA: Diagnosis not present

## 2021-12-14 ENCOUNTER — Encounter: Payer: Self-pay | Admitting: Psychiatry

## 2021-12-14 ENCOUNTER — Ambulatory Visit: Payer: Medicare HMO | Admitting: Psychiatry

## 2021-12-14 VITALS — BP 139/84 | HR 87 | Temp 98.7°F | Ht 64.0 in | Wt 253.4 lb

## 2021-12-14 DIAGNOSIS — G2119 Other drug induced secondary parkinsonism: Secondary | ICD-10-CM

## 2021-12-14 DIAGNOSIS — G4701 Insomnia due to medical condition: Secondary | ICD-10-CM | POA: Diagnosis not present

## 2021-12-14 DIAGNOSIS — F4310 Post-traumatic stress disorder, unspecified: Secondary | ICD-10-CM

## 2021-12-14 DIAGNOSIS — F251 Schizoaffective disorder, depressive type: Secondary | ICD-10-CM | POA: Diagnosis not present

## 2021-12-14 DIAGNOSIS — F411 Generalized anxiety disorder: Secondary | ICD-10-CM

## 2021-12-14 NOTE — Progress Notes (Signed)
[place] MD OP Progress Note  12/14/2021 12:59 PM [name]  MRN:  [id number]  Chief Complaint:  Chief Complaint  Patient presents with   Follow-up   Medication Refill   Depression   Hallucinations   HPI: Heather Haley is a 61 year old Caucasian female, lives in [place], has a history of PTSD, schizoaffective disorder, GAD, drug-induced Parkinson's disease, insomnia, headache, memory problems, diabetes mellitus, chronic pain was evaluated in office today.  Patient today reports she is currently doing well.  Denies any significant mood swings.  Denies any significant anxiety.  Reports she is motivated to take care of herself as well as do chores around the house.  Continues to have hallucinations, chronic, is able to ignore them.  Patient reports sleep as overall okay although she has difficulty falling asleep.  Agreeable to work on sleep hygiene.  Currently on amitriptyline and olanzapine, both of these medications help with sleep also.  Denies side effects.  Patient denies any suicidality, homicidality.  Patient continues to be under the care of neurologist for management of  tremors.  Currently on carbidopa-levodopa.  Patient appeared to be alert, oriented to person place time situation.  Concentration appeared to be limited, patient could not do calculation as well as had difficulty with spelling words due to her learning disability.  Patient with 3 word memory immediately 3 out of 3, after 5 minutes-3 out of 3.  Patient does report headaches, advised to continue to follow up with primary care provider and neurology.  Patient denies any other concerns today.  Visit Diagnosis:    ICD-10-CM   1. Schizoaffective disorder, depressive type ([place])  F25.1     2. PTSD (post-traumatic stress disorder)  F43.10     3. Generalized anxiety disorder  F41.1     4. Drug-induced Parkinson's disease ([place])  G21.19     5. Insomnia due to medical condition  G47.01    mood, pain       Past Psychiatric History: Reviewed past psychiatric history from progress note on 05/25/2017.  Past Medical History:  Past Medical History:  Diagnosis Date   Anxiety    Asthma    Chronic kidney disease    Chronic pain    previously saw Dr. Consuela Mimes in pain clinic, then saw pain specialist in [facility]   Depression    Diabetes mellitus ([place])    Frequency of urination    GERD (gastroesophageal reflux disease)    Headache(784.0)    High cholesterol    History of kidney stones    Hypertension    IBS (irritable bowel syndrome)    Left ankle instability    Left knee DJD    Lumbar Degenerative Disc Disease of  10/11/2014   Neuromuscular disorder ([place])    Osteoarthritis of hip (Right) 05/05/2015   Other enthesopathy of ankle and tarsus 12/15/2009   Qualifier: Diagnosis of  By: Oneida Alar MD, KARL     Parkinson's disease    Peripheral sensory neuropathy (Bilateral) 11/19/2014   Postoperative nausea and vomiting    Schizophrenia ([employer])     Past Surgical History:  Procedure Laterality Date   ABDOMINAL HYSTERECTOMY     ANKLE SURGERY Left    x 2   APPENDECTOMY     CARDIAC CATHETERIZATION     COLONOSCOPY  2013   COLONOSCOPY WITH PROPOFOL N/A 04/25/2017   Procedure: COLONOSCOPY WITH PROPOFOL;  Surgeon: Manya Silvas, MD;  [facility] ENDOSCOPY;  Service: Endoscopy;  Laterality: N/A;   CYSTOSCOPY/URETEROSCOPY/HOLMIUM LASER/STENT PLACEMENT  Left 09/11/2019   Procedure: CYSTOSCOPY/URETEROSCOPY/HOLMIUM LASER/STENT PLACEMENT;  Surgeon: Abbie Sons, MD;  Location: [facility] ORS;  Service: Urology;  Laterality: Left;   ESOPHAGOGASTRODUODENOSCOPY (EGD) WITH PROPOFOL N/A 10/03/2014   Procedure: ESOPHAGOGASTRODUODENOSCOPY (EGD) WITH PROPOFOL;  Surgeon: Josefine Class, MD;  Location: [facility] ENDOSCOPY;  Service: Endoscopy;  Laterality: N/A;   ESOPHAGOGASTRODUODENOSCOPY (EGD) WITH PROPOFOL  04/25/2017   Procedure: ESOPHAGOGASTRODUODENOSCOPY (EGD) WITH PROPOFOL;  Surgeon: Manya Silvas, MD;   Location: [facility] ENDOSCOPY;  Service: Endoscopy;;   JOINT REPLACEMENT Left    knee   KNEE ARTHROSCOPY  1997   left knee   LEFT HEART CATH AND CORONARY ANGIOGRAPHY Left 11/10/2017   Procedure: LEFT HEART CATH AND CORONARY ANGIOGRAPHY;  Surgeon: Isaias Cowman, MD;  Location: [facility] CV LAB;  Service: Cardiovascular;  Laterality: Left;   LEFT HEART CATHETERIZATION WITH CORONARY ANGIOGRAM N/A 01/11/2013   Procedure: LEFT HEART CATHETERIZATION WITH CORONARY ANGIOGRAM;  Surgeon: Sinclair Grooms, MD;  Location: [facility] CATH LAB;  Service: Cardiovascular;  Laterality: N/A;   TOTAL KNEE ARTHROPLASTY  08/30/2011   Procedure: TOTAL KNEE ARTHROPLASTY;  Surgeon: Lorn Junes, MD;  Location: [place];  Service: Orthopedics;  Laterality: Left;    Family Psychiatric History: Reviewed family History from progress note on 05/25/2017.  Family History:  Family History  Problem Relation Age of Onset   Cancer Mother    Hypertension Father    Heart disease Father    Alcohol abuse Father    Breast cancer Neg Hx     Social History: Reviewed social history from progress note on 05/25/2017. Social History   Socioeconomic History   Marital status: Divorced    Spouse name: Not on file   Number of children: 2   Years of education: 11   Highest education level: 11th grade  Occupational History    Employer: [employer]  Tobacco Use   Smoking status: Former    Packs/day: 2.00    Years: 15.00    Total pack years: 30.00    Types: Cigarettes    Quit date: 02/09/1995    Years since quitting: 26.8   Smokeless tobacco: Never  Vaping Use   Vaping Use: Never used  Substance and Sexual Activity   Alcohol use: No    Alcohol/week: 0.0 standard drinks of alcohol   Drug use: No   Sexual activity: Not Currently  Other Topics Concern   Not on file  Social History Narrative   Patient lives at home alone. Patient works at [employer].   Caffeine daily- 2   Right handed.   Education- 11 th grade    Social Determinants of Health   Financial Resource Strain: Medium Risk (03/24/2017)   Overall Financial Resource Strain (CARDIA)    Difficulty of Paying Living Expenses: Somewhat hard  Food Insecurity: No Food Insecurity (03/24/2017)   Hunger Vital Sign    Worried About Running Out of Food in the Last Year: Never true    Ran Out of Food in the Last Year: Never true  Transportation Needs: No Transportation Needs (03/24/2017)   PRAPARE - Hydrologist (Medical): No    Lack of Transportation (Non-Medical): No  Physical Activity: Inactive (03/24/2017)   Exercise Vital Sign    Days of Exercise per Week: 0 days    Minutes of Exercise per Session: 0 min  Stress: Stress Concern Present (03/24/2017)   [place]    Feeling of Stress :  Rather much  Social Connections: Moderately Isolated (03/24/2017)   Social Connection and Isolation Panel [NHANES]    Frequency of Communication with Friends and Family: More than three times a week    Frequency of Social Gatherings with Friends and Family: Once a week    Attends Religious Services: Never    Marine scientist or Organizations: No    Attends Archivist Meetings: Never    Marital Status: Divorced    Allergies:  Allergies  Allergen Reactions   Prolixin Decanoate [Fluphenazine]     Ineffective    Benztropine Other (See Comments)    Hair fall out  Suicide thoughts   Benztropine Mesylate Other (See Comments)   Buprenorphine Hcl Other (See Comments)    Unable to void   Carbidopa-Levodopa Nausea Only   Codeine Hives   Cymbalta [Duloxetine Hcl] Other (See Comments)    Altered mental status, Alopecia, visual hallucinations, nightmares   Gabapentin Swelling   Lyrica [Pregabalin] Other (See Comments)    Alopecia, visual hallucinations, nightmares Altered mental status   Morphine And Related Other (See Comments)    Unable to void    Nortripytline Hcl [Nortriptyline] Other (See Comments)    Hair loss and night mares    Nsaids     REACTION: palpitations, diaphoresis   Other Other (See Comments)   Penicillins Nausea And Vomiting and Other (See Comments)    REACTION: upset stomach Has patient had a PCN reaction causing immediate rash, facial/tongue/throat swelling, SOB or lightheadedness with hypotension: No Has patient had a PCN reaction causing severe rash involving mucus membranes or skin necrosis: No Has patient had a PCN reaction that required hospitalization: No Has patient had a PCN reaction occurring within the last 10 years: No If all of the above answers are "NO", then may proceed with Cephalosporin use.   Risperidone And Related Other (See Comments)    tremors   Wellbutrin [Bupropion]     Constipation, mood swings   Tolmetin Other (See Comments) and Palpitations    REACTION: palpitations, diaphoresis    Metabolic Disorder Labs: Lab Results  Component Value Date   HGBA1C 6.2 (H) 03/06/2017   MPG 131.24 03/06/2017   MPG 108 05/17/2015   Lab Results  Component Value Date   PROLACTIN 4.5 (L) 06/13/2020   PROLACTIN 64.2 (H) 03/06/2017   Lab Results  Component Value Date   CHOL 146 08/22/2018   TRIG 172 (H) 08/22/2018   HDL 49 08/22/2018   CHOLHDL 3.0 08/22/2018   VLDL 34 08/22/2018   LDLCALC 63 08/22/2018   LDLCALC 59 03/06/2017   Lab Results  Component Value Date   TSH 0.959 08/22/2018   TSH 2.766 08/24/2017    Therapeutic Level Labs: No results found for: "LITHIUM" No results found for: "VALPROATE" No results found for: "CBMZ"  Current Medications: Current Outpatient Medications  Medication Sig Dispense Refill   ACCU-CHEK GUIDE test strip      acetaminophen (TYLENOL) 500 MG tablet Take 1,000 mg by mouth every 6 (six) hours as needed for moderate pain or headache.      albuterol (PROVENTIL HFA;VENTOLIN HFA) 108 (90 Base) MCG/ACT inhaler Inhale 2 puffs into the lungs every 6 (six)  hours as needed for wheezing or shortness of breath.     Alcohol Swabs (B-D SINGLE USE SWABS REGULAR) PADS      amitriptyline (ELAVIL) 50 MG tablet TAKE 1 AND 1/2 TABLETS AT BEDTIME 135 tablet 0   aspirin EC 81 MG tablet Take 81  mg by mouth daily. Swallow whole.     B-D ULTRAFINE III SHORT PEN 31G X 8 MM MISC Inject into the skin.     Blood Glucose Calibration (ACCU-CHEK AVIVA) SOLN      Blood Glucose Monitoring Suppl (ACCU-CHEK AVIVA PLUS) w/Device KIT      buprenorphine (BUTRANS) 20 MCG/HR PTWK Place onto the skin.     butalbital-acetaminophen-caffeine (FIORICET) 50-325-40 MG tablet      carbidopa-levodopa (SINEMET IR) 25-100 MG tablet Take 1 tablet by mouth 3 (three) times daily.     Cholecalciferol (VITAMIN D-3) 125 MCG (5000 UT) TABS Take 5,000 Units by mouth daily.     glimepiride (AMARYL) 2 MG tablet Take by mouth.     insulin glargine (LANTUS SOLOSTAR) 100 UNIT/ML Solostar Pen Inject into the skin.     insulin NPH-regular Human (NOVOLIN 70/30) (70-30) 100 UNIT/ML injection Inject 15 units subcutaneously twice daily with meals     metFORMIN (GLUCOPHAGE) 500 MG tablet Take by mouth.     montelukast (SINGULAIR) 10 MG tablet Take 10 mg by mouth at bedtime.     OLANZapine (ZYPREXA) 5 MG tablet Take 1 tablet (5 mg total) by mouth at bedtime. Take 1 tablet daily at bedtime.has supplies 90 tablet 2   Omega-3 Fatty Acids (FISH OIL) 1000 MG CAPS Take by mouth.     omeprazole (PRILOSEC) 40 MG capsule      pravastatin (PRAVACHOL) 40 MG tablet Take by mouth.     QULIPTA 60 MG TABS      RELION INSULIN SYRINGE 31G X 15/64" 1 ML MISC      rizatriptan (MAXALT) 10 MG tablet Take 10 mg by mouth 2 (two) times daily as needed for migraine. May repeat in 2 hours if needed     torsemide (DEMADEX) 20 MG tablet Take 20 mg by mouth daily.     verapamil (CALAN-SR) 120 MG CR tablet Take 120 mg by mouth at bedtime.     carbidopa-levodopa (SINEMET CR) 50-200 MG tablet Take 1 tablet by mouth at bedtime.      levothyroxine (SYNTHROID) 50 MCG tablet Take by mouth.     rOPINIRole (REQUIP) 0.5 MG tablet Take 0.5 mg by mouth 3 (three) times daily.     traMADol (ULTRAM) 50 MG tablet Take 50 mg by mouth daily as needed for severe pain.     No current facility-administered medications for this visit.     Musculoskeletal: Strength & Muscle Tone: within normal limits Gait & Station: normal Patient leans: N/A  Psychiatric Specialty Exam: Review of Systems  Neurological:  Positive for tremors and headaches.  Psychiatric/Behavioral:  Positive for hallucinations.   All other systems reviewed and are negative.   Blood pressure 139/84, pulse 87, temperature 98.7 F (37.1 C), temperature source Oral, height _0  (1.626 m), weight 253 lb 6.4 oz (114.9 kg).Body mass index is 43.5 kg/m.  General Appearance: Casual  Eye Contact:  Fair  Speech:  Clear and Coherent  Volume:  Normal  Mood:  Euthymic  Affect:  Congruent  Thought Process:  Goal Directed and Descriptions of Associations: Intact  Orientation:  Full (Time, Place, and Person)  Thought Content: Hallucinations: Auditory chronic, does not bother  Suicidal Thoughts:  No  Homicidal Thoughts:  No  Memory:  Immediate;   Fair Recent;   Fair Remote;   Fair  Judgement:  Fair  Insight:  Fair  Psychomotor Activity:  Tremor  Concentration:  Concentration: Fair and Attention Span: Fair  Recall:  Fair  Fund of Knowledge: Fair  Language: Fair  Akathisia:  No  Handed:  Right  AIMS (if indicated): done  Assets:  Communication Skills Desire for Improvement Housing Social Support Transportation  ADL's:  Intact  Cognition: WNL  Sleep:  Fair   Screenings: [place] Office Visit from 12/14/2021 in [facility] [place] Office Visit from 08/28/2021 in [facility] Office Visit from 07/30/2021 in [facility] Office Visit from 04/29/2021 in [facility] from 02/25/2021 in [place] Total Score 0 0 0 0 0      AUDIT    Flowsheet Row Admission (Discharged) from [facility] from 08/30/2018 in [facility] Admission (Discharged) from 08/22/2018 in [facility] Admission (Discharged) from 03/03/2017 in [facility]  Alcohol Use Disorder Identification Test Final Score (AUDIT) 0 0 0      GAD-7    Flowsheet Row Office Visit from 12/14/2021 in [facility] Office Visit from 08/28/2021 in [place] Office Visit from 11/13/2020 in [facility] Visit from 10/14/2020 in [facility] Visit from 08/19/2020 in [place]  Total GAD-7 Score _0 PHQ2-9    [place] Visit from 12/14/2021 in [facility] Visit from 08/28/2021 in [facility] Visit from 07/30/2021 in [place] Visit from 04/29/2021 in [facility] Visit from 01/28/2021 in [facility]  PHQ-2 Total Score 1 0 0 2 1  PHQ-9 Total Score -- -- -- 7 8      [place] Office Visit from 12/14/2021 in [facility] from 08/28/2021 in [facility] Office Visit from 07/30/2021 in [facility] No Risk No Risk No Risk        Assessment and Plan: Heather Haley is a 61 year old Caucasian female who has a history of schizoaffective disorder, PTSD, migraine headaches, drug-induced Parkinson's disease was evaluated in office today.  Patient is currently stable.  Plan as noted  below.  Plan Schizoaffective disorder-stable Zyprexa 5 mg p.o. nightly  GAD-stable Elavil 75 mg p.o. nightly Failed trials of medications like propranolol, hydroxyzine. Patient was referred for CBT-[facility]-noncompliant Patient was also provided information for [facility].  PTSD-stable Elavil as prescribed  Drug induced Parkinson's disease-stable Continue carbidopa/levodopa per primary provider/neurology  Insomnia-stable Elavil 75 mg p.o. nightly Patient to work on sleep hygiene techniques.  Patient to follow up with primary care provider/neurology for headaches.  Follow-up in clinic in 3 months or sooner if needed.   This note was generated in part or whole with voice recognition software. Voice recognition is usually quite accurate but there are transcription errors that can and very often do occur. I apologize for any typographical errors that were not detected and corrected.     Ursula Alert, MD 12/14/2021, 12:59 PM

## 2021-12-15 ENCOUNTER — Telehealth: Payer: Self-pay

## 2021-12-15 NOTE — Telephone Encounter (Signed)
gave patient the instruction per dr. Shea Evans. pt states that she will try the amitriptyline '50mg'$  take 2 and see if that helps and if not she will call back. she states that she has enought medication right now.

## 2021-12-15 NOTE — Telephone Encounter (Signed)
was pt seen yesterday? did she say anything about she can not sleep she called at 8:05 left message that she wanted [place]

## 2021-12-15 NOTE — Telephone Encounter (Signed)
Noted  

## 2021-12-15 NOTE — Telephone Encounter (Signed)
Notes she did not mention any sleep problems yesterday as per review of my notes.  I did not recommend Ambien for this patient.  Could increase the olanzapine to 7.5 mg or increase the amitriptyline to 100 mg since both of those medications could also help with sleep.  If she is interested please let me know.

## 2021-12-16 ENCOUNTER — Other Ambulatory Visit: Payer: Self-pay | Admitting: Internal Medicine

## 2021-12-16 DIAGNOSIS — Z1231 Encounter for screening mammogram for malignant neoplasm of breast: Secondary | ICD-10-CM

## 2021-12-22 DIAGNOSIS — Z5181 Encounter for therapeutic drug level monitoring: Secondary | ICD-10-CM | POA: Diagnosis not present

## 2021-12-23 ENCOUNTER — Ambulatory Visit
Admission: RE | Admit: 2021-12-23 | Discharge: 2021-12-23 | Disposition: A | Discharge status: Home / Self Care | Payer: Medicare HMO | Source: Ambulatory Visit | Attending: Internal Medicine | Admitting: Internal Medicine

## 2021-12-23 DIAGNOSIS — I10 Essential (primary) hypertension: Secondary | ICD-10-CM | POA: Diagnosis not present

## 2021-12-23 DIAGNOSIS — Z1231 Encounter for screening mammogram for malignant neoplasm of breast: Secondary | ICD-10-CM | POA: Diagnosis not present

## 2021-12-23 DIAGNOSIS — E7800 Pure hypercholesterolemia, unspecified: Secondary | ICD-10-CM | POA: Diagnosis not present

## 2021-12-23 DIAGNOSIS — R002 Palpitations: Secondary | ICD-10-CM | POA: Diagnosis not present

## 2021-12-23 DIAGNOSIS — R0789 Other chest pain: Secondary | ICD-10-CM | POA: Diagnosis not present

## 2022-01-02 ENCOUNTER — Other Ambulatory Visit: Payer: Self-pay | Admitting: Psychiatry

## 2022-01-02 DIAGNOSIS — F251 Schizoaffective disorder, depressive type: Secondary | ICD-10-CM

## 2022-01-25 ENCOUNTER — Telehealth: Payer: Self-pay | Admitting: *Deleted

## 2022-01-25 NOTE — Patient Instructions (Signed)
Visit Information  Thank you for taking time to visit with me today. Please don't hesitate to contact me if I can be of assistance to you before our next scheduled telephone appointment.  Following are the goals we discussed today:  Monitor blood sugars daily  Our next appointment is by telephone on 1/31  Please call the care guide team at [phone] if you need to cancel or reschedule your appointment.   Please call the Suicide and Crisis Lifeline: 988 call the [place] National Suicide Prevention Lifeline: (669) 376-3136 or TTY: (856)686-7250 TTY 484-465-1255) to talk to a trained counselor call 1-800-273-TALK (toll free, 24 hour hotline) call 911 if you are experiencing a Mental Health or [place] or need someone to talk to.  Patient verbalizes understanding of instructions and care plan provided today and agrees to view in [place]. Active MyChart status and patient understanding of how to access instructions and care plan via MyChart confirmed with patient.     The patient has been provided with contact information for the care management team and has been advised to call with any health related questions or concerns.   Valente David, RN, MSN, [place] Care Management Care Management Coordinator [phone]

## 2022-01-25 NOTE — Patient Outreach (Signed)
  Care Coordination   Initial Visit Note   01/25/2022 Name: Heather Haley MRN: [id number] DOB: Jun 20, 1960  Heather Haley is a 61 y.o. year old female who sees Tracie Harrier, MD for primary care. I spoke with  [name] by phone today.  What matters to the patients health and wellness today?  Pain management and keep diabetes controlled    Goals Addressed             This Visit's Progress    Effective chronic care management (chronic pain and DM)       Care Coordination Interventions: Provided education to patient about basic DM disease process Reviewed medications with patient and discussed importance of medication adherence Reviewed scheduled/upcoming provider appointments including: PCP follow up in January Advised patient, providing education and rationale, to check cbg daily and record, calling provider for findings outside established parameters Screening for signs and symptoms of depression related to chronic disease state  Assessed social determinant of health barriers Reviewed provider established plan for pain management Counseled on the importance of reporting any/all new or changed pain symptoms or management strategies to pain management provider Advised patient to report to care team affect of pain on daily activities Discussed use of relaxation techniques and/or diversional activities to assist with pain reduction (distraction, imagery, relaxation, massage, acupressure, TENS, heat, and cold application Reviewed Q1J, currently 7, blood sugars less than 200, today was 180          SDOH assessments and interventions completed:  Yes  SDOH Interventions Today    Flowsheet Row Most Recent Value  SDOH Interventions   Food Insecurity Interventions Intervention Not Indicated  Housing Interventions Intervention Not Indicated  Transportation Interventions Intervention Not Indicated  Utilities Interventions Intervention Not Indicated         Care Coordination Interventions:  Yes, provided   Follow up plan: Follow up call scheduled for 1/31    Encounter Outcome:  Pt. Visit Completed   Valente David, RN, MSN, [place] Care Management Care Management Coordinator [phone]

## 2022-01-27 DIAGNOSIS — M7918 Myalgia, other site: Secondary | ICD-10-CM | POA: Diagnosis not present

## 2022-01-27 DIAGNOSIS — M542 Cervicalgia: Secondary | ICD-10-CM | POA: Diagnosis not present

## 2022-01-27 DIAGNOSIS — M5481 Occipital neuralgia: Secondary | ICD-10-CM | POA: Diagnosis not present

## 2022-02-03 DIAGNOSIS — I10 Essential (primary) hypertension: Secondary | ICD-10-CM | POA: Diagnosis not present

## 2022-02-03 DIAGNOSIS — R0609 Other forms of dyspnea: Secondary | ICD-10-CM | POA: Diagnosis not present

## 2022-02-11 DIAGNOSIS — E782 Mixed hyperlipidemia: Secondary | ICD-10-CM | POA: Diagnosis not present

## 2022-02-11 DIAGNOSIS — Z6841 Body Mass Index (BMI) 40.0 and over, adult: Secondary | ICD-10-CM | POA: Diagnosis not present

## 2022-02-11 DIAGNOSIS — N1831 Chronic kidney disease, stage 3a: Secondary | ICD-10-CM | POA: Diagnosis not present

## 2022-02-11 DIAGNOSIS — Z794 Long term (current) use of insulin: Secondary | ICD-10-CM | POA: Diagnosis not present

## 2022-02-11 DIAGNOSIS — E1122 Type 2 diabetes mellitus with diabetic chronic kidney disease: Secondary | ICD-10-CM | POA: Diagnosis not present

## 2022-02-11 DIAGNOSIS — E1165 Type 2 diabetes mellitus with hyperglycemia: Secondary | ICD-10-CM | POA: Diagnosis not present

## 2022-02-11 DIAGNOSIS — I10 Essential (primary) hypertension: Secondary | ICD-10-CM | POA: Diagnosis not present

## 2022-02-17 DIAGNOSIS — Z113 Encounter for screening for infections with a predominantly sexual mode of transmission: Secondary | ICD-10-CM | POA: Diagnosis not present

## 2022-02-17 DIAGNOSIS — Z124 Encounter for screening for malignant neoplasm of cervix: Secondary | ICD-10-CM | POA: Diagnosis not present

## 2022-02-17 DIAGNOSIS — N898 Other specified noninflammatory disorders of vagina: Secondary | ICD-10-CM | POA: Diagnosis not present

## 2022-02-24 ENCOUNTER — Other Ambulatory Visit: Payer: Self-pay | Admitting: Nurse Practitioner

## 2022-02-24 DIAGNOSIS — K219 Gastro-esophageal reflux disease without esophagitis: Secondary | ICD-10-CM | POA: Diagnosis not present

## 2022-02-24 DIAGNOSIS — R1312 Dysphagia, oropharyngeal phase: Secondary | ICD-10-CM | POA: Diagnosis not present

## 2022-02-24 DIAGNOSIS — Z86010 Personal history of colonic polyps: Secondary | ICD-10-CM | POA: Diagnosis not present

## 2022-02-24 DIAGNOSIS — G2119 Other drug induced secondary parkinsonism: Secondary | ICD-10-CM | POA: Diagnosis not present

## 2022-02-24 DIAGNOSIS — R1032 Left lower quadrant pain: Secondary | ICD-10-CM

## 2022-02-24 DIAGNOSIS — K2289 Other specified disease of esophagus: Secondary | ICD-10-CM | POA: Diagnosis not present

## 2022-02-25 ENCOUNTER — Encounter: Payer: Self-pay | Admitting: Family Medicine

## 2022-03-01 ENCOUNTER — Ambulatory Visit
Admission: RE | Admit: 2022-03-01 | Discharge: 2022-03-01 | Disposition: A | Discharge status: Home / Self Care | Payer: Medicare HMO | Source: Ambulatory Visit | Attending: Nurse Practitioner | Admitting: Nurse Practitioner

## 2022-03-01 DIAGNOSIS — R1110 Vomiting, unspecified: Secondary | ICD-10-CM | POA: Diagnosis not present

## 2022-03-01 DIAGNOSIS — I700 Atherosclerosis of aorta: Secondary | ICD-10-CM | POA: Diagnosis not present

## 2022-03-01 DIAGNOSIS — R1032 Left lower quadrant pain: Secondary | ICD-10-CM | POA: Diagnosis not present

## 2022-03-01 DIAGNOSIS — K760 Fatty (change of) liver, not elsewhere classified: Secondary | ICD-10-CM | POA: Diagnosis not present

## 2022-03-01 LAB — POCT I-STAT CREATININE: Creatinine, Ser: 0.9 mg/dL (ref 0.44–1.00)

## 2022-03-01 MED ORDER — IOHEXOL 300 MG/ML  SOLN
100.0000 mL | Freq: Once | INTRAMUSCULAR | Status: AC | PRN
  Administered 2022-03-01: 100 mL via INTRAVENOUS

## 2022-03-05 DIAGNOSIS — R933 Abnormal findings on diagnostic imaging of other parts of digestive tract: Secondary | ICD-10-CM | POA: Diagnosis not present

## 2022-03-10 ENCOUNTER — Ambulatory Visit: Payer: Self-pay | Admitting: *Deleted

## 2022-03-10 NOTE — Patient Outreach (Signed)
  Care Coordination   03/10/2022 Name: KIELYN KARDELL MRN: [id number] DOB: September 20, 1960   Care Coordination Outreach Attempts:  An unsuccessful telephone outreach was attempted for a scheduled appointment today.  Follow Up Plan:  Additional outreach attempts will be made to offer the patient care coordination information and services.   Encounter Outcome:  No Answer   Care Coordination Interventions:  No, not indicated    Valente David, RN, MSN, [facility] Care Management Care Management Coordinator [phone]

## 2022-03-10 NOTE — Patient Outreach (Signed)
  Care Coordination   Follow Up Visit Note   03/10/2022 Name: Heather Haley MRN: [id number] DOB: 1960/04/07  Heather Haley is a 62 y.o. year old female who sees Tracie Harrier, MD for primary care. I spoke with  [name] by phone today.  What matters to the patients health and wellness today?  Stopping midnight snacks to decrease blood sugars.     Goals Addressed             This Visit's Progress    Effective chronic care management (chronic pain and DM)   On track    Care Coordination Interventions: Provided education to patient about basic DM disease process Reviewed medications with patient and discussed importance of medication adherence Reviewed scheduled/upcoming provider appointments including: PCP follow up in February and endocrinology in April Advised patient, providing education and rationale, to check cbg daily and record, calling provider for findings outside established parameters Reviewed provider established plan for pain management Counseled on the importance of reporting any/all new or changed pain symptoms or management strategies to pain management provider Advised patient to report to care team affect of pain on daily activities Discussed use of relaxation techniques and/or diversional activities to assist with pain reduction (distraction, imagery, relaxation, massage, acupressure, TENS, heat, and cold application Reviewed J2E, currently 7, blood sugars range 150-200.  State she has been eating midnight snacks which has increased her daily readings.  She is working on diet change to help with effective management          SDOH assessments and interventions completed:  No     Care Coordination Interventions:  Yes, provided   Follow up plan: Follow up call scheduled for 4/25    Encounter Outcome:  Pt. Visit Completed   Valente David, RN, MSN, [place] Care Management Care Management Coordinator [phone]

## 2022-03-10 NOTE — Patient Instructions (Signed)
Visit Information  Thank you for taking time to visit with me today. Please don't hesitate to contact me if I can be of assistance to you before our next scheduled telephone appointment.  Following are the goals we discussed today:  Continue monitoring blood sugars daily.   Cut out midnight snacks, eat more protein, fruits and vegetables.  Our next appointment is by telephone on 4/25  Please call the care guide team at [phone] if you need to cancel or reschedule your appointment.   Please call the Suicide and Crisis Lifeline: 988 call the [place] National Suicide Prevention Lifeline: (815) 450-8544 or TTY: 737-621-6857 TTY 279-157-6838) to talk to a trained counselor call 1-800-273-TALK (toll free, 24 hour hotline) call 911 if you are experiencing a Mental Health or [place] or need someone to talk to.  Patient verbalizes understanding of instructions and care plan provided today and agrees to view in [place]. Active MyChart status and patient understanding of how to access instructions and care plan via MyChart confirmed with patient.     The patient has been provided with contact information for the care management team and has been advised to call with any health related questions or concerns.   Valente David, RN, MSN, [place] Care Management Care Management Coordinator [phone]

## 2022-03-16 ENCOUNTER — Ambulatory Visit: Payer: Medicare HMO | Admitting: Psychiatry

## 2022-03-16 ENCOUNTER — Encounter: Payer: Self-pay | Admitting: Psychiatry

## 2022-03-16 VITALS — BP 116/77 | HR 90 | Temp 97.5°F | Ht 64.0 in | Wt 252.4 lb

## 2022-03-16 DIAGNOSIS — J4540 Moderate persistent asthma, uncomplicated: Secondary | ICD-10-CM | POA: Diagnosis not present

## 2022-03-16 DIAGNOSIS — R8290 Unspecified abnormal findings in urine: Secondary | ICD-10-CM | POA: Diagnosis not present

## 2022-03-16 DIAGNOSIS — F4310 Post-traumatic stress disorder, unspecified: Secondary | ICD-10-CM

## 2022-03-16 DIAGNOSIS — F411 Generalized anxiety disorder: Secondary | ICD-10-CM

## 2022-03-16 DIAGNOSIS — G4701 Insomnia due to medical condition: Secondary | ICD-10-CM | POA: Diagnosis not present

## 2022-03-16 DIAGNOSIS — G2119 Other drug induced secondary parkinsonism: Secondary | ICD-10-CM

## 2022-03-16 DIAGNOSIS — F251 Schizoaffective disorder, depressive type: Secondary | ICD-10-CM

## 2022-03-16 DIAGNOSIS — G8929 Other chronic pain: Secondary | ICD-10-CM | POA: Diagnosis not present

## 2022-03-16 DIAGNOSIS — M545 Low back pain, unspecified: Secondary | ICD-10-CM | POA: Diagnosis not present

## 2022-03-16 DIAGNOSIS — I10 Essential (primary) hypertension: Secondary | ICD-10-CM | POA: Diagnosis not present

## 2022-03-16 DIAGNOSIS — E1165 Type 2 diabetes mellitus with hyperglycemia: Secondary | ICD-10-CM | POA: Diagnosis not present

## 2022-03-16 DIAGNOSIS — Z6841 Body Mass Index (BMI) 40.0 and over, adult: Secondary | ICD-10-CM | POA: Diagnosis not present

## 2022-03-16 DIAGNOSIS — Z23 Encounter for immunization: Secondary | ICD-10-CM | POA: Diagnosis not present

## 2022-03-16 MED ORDER — OLANZAPINE 5 MG PO TABS: 2.5000 mg | ORAL_TABLET | Freq: Every day | ORAL | 3 refills | Status: DC

## 2022-03-16 NOTE — Progress Notes (Signed)
BH MD OP Progress Note  03/16/2022 1:33 PM Heather Haley  MRN:  [id number]  Chief Complaint:  Chief Complaint  Patient presents with   Follow-up   Depression   Anxiety   Hallucinations   Medication Refill   HPI: Heather Haley is a 62 year old Caucasian female, lives in [place], has a history of PTSD, schizoaffective disorder, GAD, drug induced Parkinson's disease, insomnia, headache, memory problems, diabetes mellitus, chronic pain was evaluated in office today.  Patient today reports she is currently struggling with lower abdominal pain, had a CT scan of her abdomen, was recommended to get a colonoscopy.  Awaiting that.  She also has upcoming appointment with primary care provider does have urinary tract infection symptoms, agrees to talk to her primary about the same.  Reports overall mood symptoms as currently stable.  Does have auditory hallucinations, chronic however coping with it okay.  Does not overwhelm her.  Reports sleep is overall good.  Denies any suicidality, homicidality.  Patient reports she is currently compliant on the olanzapine.  Does struggle with her weight as well as increased appetite on and off.  Patient also with recent hemoglobin A1c, more than 8.  Patient aware of long-term adverse side effects of medications like olanzapine which are atypical antipsychotics.  Patient agreeable to reduce the dosage.  Appeared to be alert, ended to person place time and situation.  3 word memory immediate 3 out of 3, after 5 minutes 3 out of 3.  Patient was able to repeat digits forward and backward without any problem.  Attention and focus seem to be good in session today.  Patient denies any other concerns today.  Visit Diagnosis:    ICD-10-CM   1. Schizoaffective disorder, depressive type (HCC)  F25.1 OLANZapine (ZYPREXA) 5 MG tablet    2. PTSD (post-traumatic stress disorder)  F43.10     3. Generalized anxiety disorder  F41.1     4. Drug-induced  Parkinson's disease (HCC)  G21.19     5. Insomnia due to medical condition  G47.01    mood      Past Psychiatric History: Reviewed past psychiatric history from progress note on 05/25/2017.  Past Medical History:  Past Medical History:  Diagnosis Date   Anxiety    Asthma    Chronic kidney disease    Chronic pain    previously saw Dr. Shireen Quan in pain clinic, then saw pain specialist in [facility]   Depression    Diabetes mellitus (HCC)    Frequency of urination    GERD (gastroesophageal reflux disease)    Headache(784.0)    High cholesterol    History of kidney stones    Hypertension    IBS (irritable bowel syndrome)    Left ankle instability    Left knee DJD    Lumbar Degenerative Disc Disease of  10/11/2014   Neuromuscular disorder (HCC)    Osteoarthritis of hip (Right) 05/05/2015   Other enthesopathy of ankle and tarsus 12/15/2009   Qualifier: Diagnosis of  By: Darrick Penna MD, KARL     Parkinson's disease    Peripheral sensory neuropathy (Bilateral) 11/19/2014   Postoperative nausea and vomiting    Schizophrenia (HCC)     Past Surgical History:  Procedure Laterality Date   ABDOMINAL HYSTERECTOMY     ANKLE SURGERY Left    x 2   APPENDECTOMY     CARDIAC CATHETERIZATION     COLONOSCOPY  2013   COLONOSCOPY WITH PROPOFOL N/A 04/25/2017   Procedure: COLONOSCOPY WITH  PROPOFOL;  Surgeon: Scot Jun, MD;  Location: [facility] ENDOSCOPY;  Service: Endoscopy;  Laterality: N/A;   CYSTOSCOPY/URETEROSCOPY/HOLMIUM LASER/STENT PLACEMENT Left 09/11/2019   Procedure: CYSTOSCOPY/URETEROSCOPY/HOLMIUM LASER/STENT PLACEMENT;  Surgeon: Riki Altes, MD;  Location: [facility] ORS;  Service: Urology;  Laterality: Left;   ESOPHAGOGASTRODUODENOSCOPY (EGD) WITH PROPOFOL N/A 10/03/2014   Procedure: ESOPHAGOGASTRODUODENOSCOPY (EGD) WITH PROPOFOL;  Surgeon: Elnita Maxwell, MD;  Location: [facility] ENDOSCOPY;  Service: Endoscopy;  Laterality: N/A;   ESOPHAGOGASTRODUODENOSCOPY (EGD) WITH PROPOFOL  04/25/2017    Procedure: ESOPHAGOGASTRODUODENOSCOPY (EGD) WITH PROPOFOL;  Surgeon: Scot Jun, MD;  Location: [facility] ENDOSCOPY;  Service: Endoscopy;;   JOINT REPLACEMENT Left    knee   KNEE ARTHROSCOPY  1997   left knee   LEFT HEART CATH AND CORONARY ANGIOGRAPHY Left 11/10/2017   Procedure: LEFT HEART CATH AND CORONARY ANGIOGRAPHY;  Surgeon: Marcina Millard, MD;  Location: [facility] INVASIVE CV LAB;  Service: Cardiovascular;  Laterality: Left;   LEFT HEART CATHETERIZATION WITH CORONARY ANGIOGRAM N/A 01/11/2013   Procedure: LEFT HEART CATHETERIZATION WITH CORONARY ANGIOGRAM;  Surgeon: [name], MD;  Location: [facility] CATH LAB;  Service: Cardiovascular;  Laterality: N/A;   TOTAL KNEE ARTHROPLASTY  08/30/2011   Procedure: TOTAL KNEE ARTHROPLASTY;  Surgeon: Nilda Simmer, MD;  Location: [facility] OR;  Service: Orthopedics;  Laterality: Left;    Family Psychiatric History: Reviewed family psychiatric history from progress note on 05/25/2017.  Family History:  Family History  Problem Relation Age of Onset   Cancer Mother    Hypertension Father    Heart disease Father    Alcohol abuse Father    Breast cancer Neg Hx     Social History: Reviewed social history from progress note on 05/25/2017. Social History   Socioeconomic History   Marital status: Divorced    Spouse name: Not on file   Number of children: 2   Years of education: 11   Highest education level: 11th grade  Occupational History    Employer: [employer]  Tobacco Use   Smoking status: Former    Packs/day: 2.00    Years: 15.00    Total pack years: 30.00    Types: Cigarettes    Quit date: 02/09/1995    Years since quitting: 27.1   Smokeless tobacco: Never  Vaping Use   Vaping Use: Never used  Substance and Sexual Activity   Alcohol use: No    Alcohol/week: 0.0 standard drinks of alcohol   Drug use: No   Sexual activity: Not Currently  Other Topics Concern   Not on file  Social History Narrative   Patient lives at home  alone. Patient works at [employer].   Caffeine daily- 2   Right handed.   Education- 11 th grade   Social Determinants of Health   Financial Resource Strain: Medium Risk (03/24/2017)   Overall Financial Resource Strain (CARDIA)    Difficulty of Paying Living Expenses: Somewhat hard  Food Insecurity: No Food Insecurity (01/25/2022)   Hunger Vital Sign    Worried About Running Out of Food in the Last Year: Never true    Ran Out of Food in the Last Year: Never true  Transportation Needs: No Transportation Needs (01/25/2022)   PRAPARE - Administrator, Civil Service (Medical): No    Lack of Transportation (Non-Medical): No  Physical Activity: Inactive (03/24/2017)   Exercise Vital Sign    Days of Exercise per Week: 0 days    Minutes of Exercise per Session: 0 min  Stress: Stress Concern Present (03/24/2017)   [employer] of Occupational Health - Occupational Stress Questionnaire    Feeling of Stress : Rather much  Social Connections: Moderately Isolated (03/24/2017)   Social Connection and Isolation Panel [NHANES]    Frequency of Communication with Friends and Family: More than three times a week    Frequency of Social Gatherings with Friends and Family: Once a week    Attends Religious Services: Never    Database administrator or Organizations: No    Attends Banker Meetings: Never    Marital Status: Divorced    Allergies:  Allergies  Allergen Reactions   Prolixin Decanoate [Fluphenazine]     Ineffective    Benztropine Other (See Comments)    Hair fall out  Suicide thoughts   Benztropine Mesylate Other (See Comments)   Buprenorphine Hcl Other (See Comments)    Unable to void   Carbidopa-Levodopa Nausea Only   Codeine Hives   Cymbalta [Duloxetine Hcl] Other (See Comments)    Altered mental status, Alopecia, visual hallucinations, nightmares   Gabapentin Swelling   Lyrica [Pregabalin] Other (See Comments)    Alopecia, visual hallucinations,  nightmares Altered mental status   Morphine And Related Other (See Comments)    Unable to void   Nortripytline Hcl [Nortriptyline] Other (See Comments)    Hair loss and night mares    Nsaids     REACTION: palpitations, diaphoresis   Other Other (See Comments)   Penicillins Nausea And Vomiting and Other (See Comments)    REACTION: upset stomach Has patient had a PCN reaction causing immediate rash, facial/tongue/throat swelling, SOB or lightheadedness with hypotension: No Has patient had a PCN reaction causing severe rash involving mucus membranes or skin necrosis: No Has patient had a PCN reaction that required hospitalization: No Has patient had a PCN reaction occurring within the last 10 years: No If all of the above answers are "NO", then may proceed with Cephalosporin use.   Risperidone And Related Other (See Comments)    tremors   Wellbutrin [Bupropion]     Constipation, mood swings   Tolmetin Other (See Comments) and Palpitations    REACTION: palpitations, diaphoresis    Metabolic Disorder Labs: Lab Results  Component Value Date   HGBA1C 6.2 (H) 03/06/2017   MPG 131.24 03/06/2017   MPG 108 05/17/2015   Lab Results  Component Value Date   PROLACTIN 4.5 (L) 06/13/2020   PROLACTIN 64.2 (H) 03/06/2017   Lab Results  Component Value Date   CHOL 146 08/22/2018   TRIG 172 (H) 08/22/2018   HDL 49 08/22/2018   CHOLHDL 3.0 08/22/2018   VLDL 34 08/22/2018   LDLCALC 63 08/22/2018   LDLCALC 59 03/06/2017   Lab Results  Component Value Date   TSH 0.959 08/22/2018   TSH 2.766 08/24/2017    Therapeutic Level Labs: No results found for: "LITHIUM" No results found for: "VALPROATE" No results found for: "CBMZ"  Current Medications: Current Outpatient Medications  Medication Sig Dispense Refill   ACCU-CHEK GUIDE test strip      acetaminophen (TYLENOL) 500 MG tablet Take 1,000 mg by mouth every 6 (six) hours as needed for moderate pain or headache.      albuterol  (PROVENTIL HFA;VENTOLIN HFA) 108 (90 Base) MCG/ACT inhaler Inhale 2 puffs into the lungs every 6 (six) hours as needed for wheezing or shortness of breath.     Alcohol Swabs (B-D SINGLE USE SWABS REGULAR) PADS      amitriptyline (  ELAVIL) 50 MG tablet TAKE 1 AND 1/2 TABLETS AT BEDTIME 135 tablet 0   aspirin EC 81 MG tablet Take 81 mg by mouth daily. Swallow whole.     B-D ULTRAFINE III SHORT PEN 31G X 8 MM MISC Inject into the skin.     Blood Glucose Calibration (ACCU-CHEK AVIVA) SOLN      Blood Glucose Monitoring Suppl (ACCU-CHEK AVIVA PLUS) w/Device KIT      buprenorphine (BUTRANS) 20 MCG/HR PTWK Place onto the skin.     butalbital-acetaminophen-caffeine (FIORICET) 50-325-40 MG tablet      carbidopa-levodopa (SINEMET CR) 50-200 MG tablet Take 1 tablet by mouth at bedtime.     carbidopa-levodopa (SINEMET IR) 25-100 MG tablet Take 1 tablet by mouth 3 (three) times daily.     Cholecalciferol (VITAMIN D-3) 125 MCG (5000 UT) TABS Take 5,000 Units by mouth daily.     glimepiride (AMARYL) 2 MG tablet Take by mouth.     insulin glargine (LANTUS SOLOSTAR) 100 UNIT/ML Solostar Pen Inject into the skin.     insulin NPH-regular Human (NOVOLIN 70/30) (70-30) 100 UNIT/ML injection Inject 15 units subcutaneously twice daily with meals     metFORMIN (GLUCOPHAGE) 500 MG tablet Take by mouth.     montelukast (SINGULAIR) 10 MG tablet Take 10 mg by mouth at bedtime.     Omega-3 Fatty Acids (FISH OIL) 1000 MG CAPS Take by mouth.     omeprazole (PRILOSEC) 40 MG capsule      pravastatin (PRAVACHOL) 40 MG tablet Take by mouth.     QULIPTA 60 MG TABS      RELION INSULIN SYRINGE 31G X 15/64" 1 ML MISC      rizatriptan (MAXALT) 10 MG tablet Take 10 mg by mouth 2 (two) times daily as needed for migraine. May repeat in 2 hours if needed     rOPINIRole (REQUIP) 0.5 MG tablet Take 0.5 mg by mouth 3 (three) times daily.     torsemide (DEMADEX) 20 MG tablet Take 20 mg by mouth daily.     traMADol (ULTRAM) 50 MG tablet Take  50 mg by mouth daily as needed for severe pain.     verapamil (CALAN-SR) 120 MG CR tablet Take 120 mg by mouth at bedtime.     levothyroxine (SYNTHROID) 50 MCG tablet Take by mouth.     OLANZapine (ZYPREXA) 5 MG tablet Take 0.5 tablets (2.5 mg total) by mouth at bedtime. 90 tablet 3   No current facility-administered medications for this visit.     Musculoskeletal: Strength & Muscle Tone: within normal limits Gait & Station:  walks with cane Patient leans: N/A  Psychiatric Specialty Exam: Review of Systems  Gastrointestinal:  Positive for abdominal pain.  Neurological:  Positive for tremors.  Psychiatric/Behavioral:  Positive for hallucinations (chronic AH).   All other systems reviewed and are negative.   Blood pressure 116/77, pulse 90, temperature (!) 97.5 F (36.4 C), temperature source Skin, height 5\' 4"  (1.626 m), weight 252 lb 6.4 oz (114.5 kg).Body mass index is 43.32 kg/m.  General Appearance: Casual  Eye Contact:  Fair  Speech:  Clear and Coherent  Volume:  Normal  Mood:  Euthymic  Affect:  Congruent  Thought Process:  Goal Directed and Descriptions of Associations: Intact  Orientation:  Full (Time, Place, and Person)  Thought Content: Hallucinations: Auditory   Suicidal Thoughts:  No  Homicidal Thoughts:  No  Memory:  Immediate;   Fair Recent;   Fair Remote;   Fair  Judgement:  Fair  Insight:  Fair  Psychomotor Activity:  Normal  Concentration:  Concentration: Fair and Attention Span: Fair  Recall:  [employer] of Knowledge: Fair  Language: Fair  Akathisia:  No  Handed:  Right  AIMS (if indicated): done  Assets:  Communication Skills Desire for Improvement Housing Social Support  ADL's:  Intact  Cognition: WNL  Sleep:  Fair   Screenings: Midwife Visit from 03/16/2022 in [facility] Office Visit from 12/14/2021 in [facility] Office Visit from  08/28/2021 in [facility] Office Visit from 07/30/2021 in [facility] Office Visit from 04/29/2021 in [facility]  AIMS Total Score 0 0 0 0 0      AUDIT    Flowsheet Row Admission (Discharged) from OP Visit from 08/30/2018 [facility] ([facility] Ad[facility] (Discharged) from [facility]  Alcohol Use Disorder Identification Test Final Score (AUDIT) 0 0 0      GAD-7    Flowsheet Row Office Visit from 03/16/2022 in [facility] Office Visit from 12/14/2021 in [facility] Office Visit from 08/28/2021 in [facility] Office Visit from 11/13/2020 in [facility] Office Visit from 10/14/2020 in [facility]  Total GAD-7 Score 5 8 5 3 5       PHQ2-9    Flowsheet Row Office Visit from 03/16/2022 in [facility] Office Visit from 12/14/2021 in [facility] Office Visit from 08/28/2021 in [facility] Office Visit from 07/30/2021 in [facility] Office Visit from 04/29/2021 in [facility]  PHQ-2 Total Score 0 1 0 0 2  PHQ-9 Total Score 6 -- -- -- 7      Flowsheet Row Office Visit from 03/16/2022 in [facility] Office Visit from 12/14/2021 in [facility] Office Visit from 08/28/2021 in [facility]  C-SSRS RISK CATEGORY No Risk No Risk No Risk         Assessment and Plan: Heather Haley is a 62 year old Caucasian female who has a history of schizoaffective disorder, PTSD, migraine headaches, drug-induced Parkinson's disease was evaluated in office today.  Patient is currently stable however does struggle with uncontrolled blood sugar level, recent hemoglobin A1c more than 8, will benefit from dosage reduction of atypical antipsychotic olanzapine due to long-term side effects, patient is willing to try.  Plan as noted below.  Plan Schizoaffective disorder-stable Reduce Zyprexa to 2.5 mg p.o. nightly  GAD-stable Elavil 75 mg p.o. nightly Patient was referred for CBT in the past   PTSD-stable Elavil as prescribed  Drug-induced Parkinson's disease-stable Currently on carbidopa/levodopa per primary provider/neurology.  Reviewed labs including hemoglobinA1C-02/11/2022-elevated at 8.4.  Will try to reduce Zyprexa dosage and reevaluate patient.  Patient with previous trials of multiple atypical and typical antipsychotics and this is the medication that has worked for her however due to weight gain, recent changes in her blood sugar level, will attempt to reduce the dosage of olanzapine.  Follow-up in clinic in 4 weeks or sooner if needed.  This note  was generated in part or whole with voice recognition software. Voice recognition is usually quite accurate but there are transcription errors that can and very often do occur. I apologize for any typographical errors that were not detected and corrected.     Jomarie Longs, MD 03/17/2022, 9:33 AM

## 2022-03-23 DIAGNOSIS — I700 Atherosclerosis of aorta: Secondary | ICD-10-CM | POA: Diagnosis not present

## 2022-03-23 DIAGNOSIS — J4530 Mild persistent asthma, uncomplicated: Secondary | ICD-10-CM | POA: Diagnosis not present

## 2022-03-23 DIAGNOSIS — Z1331 Encounter for screening for depression: Secondary | ICD-10-CM | POA: Diagnosis not present

## 2022-03-23 DIAGNOSIS — Z6841 Body Mass Index (BMI) 40.0 and over, adult: Secondary | ICD-10-CM | POA: Diagnosis not present

## 2022-03-23 DIAGNOSIS — K760 Fatty (change of) liver, not elsewhere classified: Secondary | ICD-10-CM | POA: Diagnosis not present

## 2022-03-23 DIAGNOSIS — F209 Schizophrenia, unspecified: Secondary | ICD-10-CM | POA: Diagnosis not present

## 2022-03-23 DIAGNOSIS — Z0000 Encounter for general adult medical examination without abnormal findings: Secondary | ICD-10-CM | POA: Diagnosis not present

## 2022-03-23 DIAGNOSIS — I10 Essential (primary) hypertension: Secondary | ICD-10-CM | POA: Diagnosis not present

## 2022-03-26 ENCOUNTER — Telehealth: Payer: Self-pay

## 2022-03-26 NOTE — Telephone Encounter (Signed)
Noted , her medication was reduced last visit and if no changes , please advise to continue the same dose.

## 2022-03-26 NOTE — Telephone Encounter (Signed)
pt left a messge that she can not tell any difference in medications.

## 2022-03-29 NOTE — Telephone Encounter (Signed)
Pt was notified.  

## 2022-04-20 ENCOUNTER — Telehealth: Payer: Self-pay

## 2022-04-20 DIAGNOSIS — F411 Generalized anxiety disorder: Secondary | ICD-10-CM

## 2022-04-20 DIAGNOSIS — F4310 Post-traumatic stress disorder, unspecified: Secondary | ICD-10-CM

## 2022-04-20 NOTE — Telephone Encounter (Signed)
pt left a message that she needs a refill on the amitriptyline     amitriptyline (ELAVIL) 50 MG tablet Medication Date: 10/01/2021 Department: [facility] Ordering/Authorizing: Ursula Alert, MD   Order Providers  Prescribing Provider Encounter Provider  Ursula Alert, MD Ursula Alert, MD   Outpatient Medication Detail   Disp Refills Start End   amitriptyline (ELAVIL) 50 MG tablet 135 tablet 0 10/01/2021    Sig: TAKE 1 AND 1/2 TABLETS AT BEDTIME   Sent to pharmacy as: amitriptyline (ELAVIL) 50 MG tablet   E-Prescribing Status: Receipt confirmed by pharmacy (10/01/2021  2:54 PM EDT)

## 2022-04-20 NOTE — Telephone Encounter (Signed)
pt was last seen on 03-16-22 next appt 05-12-22

## 2022-04-21 MED ORDER — AMITRIPTYLINE HCL 50 MG PO TABS: ORAL_TABLET | ORAL | 0 refills | Status: DC

## 2022-04-21 NOTE — Telephone Encounter (Signed)
pt notifited rx sent

## 2022-04-21 NOTE — Telephone Encounter (Signed)
I have sent  amitriptyline to pharmacy per request.

## 2022-04-27 ENCOUNTER — Ambulatory Visit (INDEPENDENT_AMBULATORY_CARE_PROVIDER_SITE_OTHER): Payer: Medicare HMO | Admitting: Psychiatry

## 2022-04-27 ENCOUNTER — Encounter: Payer: Self-pay | Admitting: Psychiatry

## 2022-04-27 VITALS — BP 139/85 | HR 88 | Temp 98.1°F | Ht 64.0 in | Wt 251.2 lb

## 2022-04-27 DIAGNOSIS — G4701 Insomnia due to medical condition: Secondary | ICD-10-CM | POA: Diagnosis not present

## 2022-04-27 DIAGNOSIS — F4310 Post-traumatic stress disorder, unspecified: Secondary | ICD-10-CM | POA: Diagnosis not present

## 2022-04-27 DIAGNOSIS — F411 Generalized anxiety disorder: Secondary | ICD-10-CM | POA: Diagnosis not present

## 2022-04-27 DIAGNOSIS — G2119 Other drug induced secondary parkinsonism: Secondary | ICD-10-CM | POA: Diagnosis not present

## 2022-04-27 DIAGNOSIS — F251 Schizoaffective disorder, depressive type: Secondary | ICD-10-CM

## 2022-04-27 NOTE — Progress Notes (Unsigned)
error 

## 2022-04-27 NOTE — Progress Notes (Unsigned)
[facility] MD OP Progress Note  04/27/2022 4:56 PM Heather Haley  MRN:  [id number]  Chief Complaint:  Chief Complaint  Patient presents with   Follow-up   Anxiety   Hallucinations   Medication Refill   HPI: Heather Haley is a 62 year old Caucasian female, lives in [place], has a history of PTSD, schizoaffective disorder, GAD, drug-induced Parkinson's disease, insomnia, headache, memory problems, diabetes mellitus, chronic pain was evaluated in office today.  Patient today reports she is currently struggling with multiple health issues including severe headaches which are sharp, usually on one side of her head radiating down the back of her head, hoarseness of voice going on since the past several weeks, GI problems.  Patient reports she has upcoming colonoscopy and endoscopy coming up in April.  She also agrees to follow up with her primary care provider for her headache.  She reports this morning she had an episode when she was talking to someone, all of a sudden she felt lightheaded and was about to fall although the other person was able to hold her and break her fall.  Patient reports after a few minutes she felt better.  She does not know what could have happened.  She reports she checked her blood pressure as well as her blood sugar levels soon after that and both were kind of high.  Patient reports overall mood symptoms are currently stable except for her anxiety about her health issues.  Denies any hallucinations.  Reports sleep is overall good.  She however reports she feels tired and fatigued throughout the day in spite of sleeping okay.  Denies any suicidality, homicidality or perceptual disturbances.  Reports she is tolerating the lower dosage of olanzapine well.  Currently compliant on the amitriptyline.  Patient denies any other concerns today.  Visit Diagnosis:    ICD-10-CM   1. Schizoaffective disorder, depressive type ([place])  F25.1     2. PTSD (post-traumatic  stress disorder)  F43.10     3. Generalized anxiety disorder  F41.1     4. Drug-induced Parkinson's disease ([place])  G21.19     5. Insomnia due to medical condition  G47.01    mood      Past Psychiatric History: I have reviewed past psychiatric history from progress note on 05/25/2017.  Past Medical History:  Past Medical History:  Diagnosis Date   Anxiety    Asthma    Chronic kidney disease    Chronic pain    previously saw Dr. Consuela Mimes in pain clinic, then saw pain specialist in [facility]   Depression    Diabetes mellitus ([place])    Frequency of urination    GERD (gastroesophageal reflux disease)    Headache(784.0)    High cholesterol    History of kidney stones    Hypertension    IBS (irritable bowel syndrome)    Left ankle instability    Left knee DJD    Lumbar Degenerative Disc Disease of  10/11/2014   Neuromuscular disorder (HCC)    Osteoarthritis of hip (Right) 05/05/2015   Other enthesopathy of ankle and tarsus 12/15/2009   Qualifier: Diagnosis of  By: Oneida Alar MD, KARL     Parkinson's disease    Peripheral sensory neuropathy (Bilateral) 11/19/2014   Postoperative nausea and vomiting    Schizophrenia ([place])     Past Surgical History:  Procedure Laterality Date   ABDOMINAL HYSTERECTOMY     ANKLE SURGERY Left    x 2   APPENDECTOMY  CARDIAC CATHETERIZATION     COLONOSCOPY  2013   COLONOSCOPY WITH PROPOFOL N/A 04/25/2017   Procedure: COLONOSCOPY WITH PROPOFOL;  Surgeon: Manya Silvas, MD;  Location: [facility] ENDOSCOPY;  Service: Endoscopy;  Laterality: N/A;   CYSTOSCOPY/URETEROSCOPY/HOLMIUM LASER/STENT PLACEMENT Left 09/11/2019   Procedure: CYSTOSCOPY/URETEROSCOPY/HOLMIUM LASER/STENT PLACEMENT;  Surgeon: Abbie Sons, MD;  Location: [facility] ORS;  Service: Urology;  Laterality: Left;   ESOPHAGOGASTRODUODENOSCOPY (EGD) WITH PROPOFOL N/A 10/03/2014   Procedure: ESOPHAGOGASTRODUODENOSCOPY (EGD) WITH PROPOFOL;  Surgeon: Josefine Class, MD;  Location: [facility] ENDOSCOPY;   Service: Endoscopy;  Laterality: N/A;   ESOPHAGOGASTRODUODENOSCOPY (EGD) WITH PROPOFOL  04/25/2017   Procedure: ESOPHAGOGASTRODUODENOSCOPY (EGD) WITH PROPOFOL;  Surgeon: Manya Silvas, MD;  Location: [facility] ENDOSCOPY;  Service: Endoscopy;;   JOINT REPLACEMENT Left    knee   KNEE ARTHROSCOPY  1997   left knee   LEFT HEART CATH AND CORONARY ANGIOGRAPHY Left 11/10/2017   Procedure: LEFT HEART CATH AND CORONARY ANGIOGRAPHY;  Surgeon: Isaias [place], MD;  Location: [facility] CV LAB;  Service: Cardiovascular;  Laterality: Left;   LEFT HEART CATHETERIZATION WITH CORONARY ANGIOGRAM N/A 01/11/2013   Procedure: LEFT HEART CATHETERIZATION WITH CORONARY ANGIOGRAM;  Surgeon: Sinclair Grooms, MD;  Location: [facility] CATH LAB;  Service: Cardiovascular;  Laterality: N/A;   TOTAL KNEE ARTHROPLASTY  08/30/2011   Procedure: TOTAL KNEE ARTHROPLASTY;  Surgeon: Lorn Junes, MD;  Location: [facility];  Service: Orthopedics;  Laterality: Left;    Family Psychiatric History: I have reviewed family psychiatric history from progress note on 05/25/2017.  Family History:  Family History  Problem Relation Age of Onset   Cancer Mother    Hypertension Father    Heart disease Father    Alcohol abuse Father    Breast cancer Neg Hx     Social History: I have reviewed social history from progress note on 05/25/2017. Social History   Socioeconomic History   Marital status: Divorced    Spouse name: Not on file   Number of children: 2   Years of education: 11   Highest education level: 11th grade  Occupational History    Employer: [employer]  Tobacco Use   Smoking status: Former    Packs/day: 2.00    Years: 15.00    Additional pack years: 0.00    Total pack years: 30.00    Types: Cigarettes    Quit date: 02/09/1995    Years since quitting: 27.2   Smokeless tobacco: Never  Vaping Use   Vaping Use: Never used  Substance and Sexual Activity   Alcohol use: No    Alcohol/week: 0.0 standard drinks of  alcohol   Drug use: No   Sexual activity: Not Currently  Other Topics Concern   Not on file  Social History Narrative   Patient lives at home alone. Patient works at [employer].   Caffeine daily- 2   Right handed.   Education- 11 th grade   Social Determinants of Health   Financial Resource Strain: Medium Risk (03/24/2017)   Overall Financial Resource Strain (CARDIA)    Difficulty of Paying Living Expenses: Somewhat hard  Food Insecurity: No Food Insecurity (01/25/2022)   Hunger Vital Sign    Worried About Running Out of Food in the Last Year: Never true    Ran Out of Food in the Last Year: Never true  Transportation Needs: No Transportation Needs (01/25/2022)   PRAPARE - Hydrologist (Medical): No    Lack of Transportation (  Non-Medical): No  Physical Activity: Inactive (03/24/2017)   Exercise Vital Sign    Days of Exercise per Week: 0 days    Minutes of Exercise per Session: 0 min  Stress: Stress Concern Present (03/24/2017)   [place]    Feeling of Stress : Rather much  Social Connections: Moderately Isolated (03/24/2017)   Social Connection and Isolation Panel [NHANES]    Frequency of Communication with Friends and Family: More than three times a week    Frequency of Social Gatherings with Friends and Family: Once a week    Attends Religious Services: Never    Marine scientist or Organizations: No    Attends Archivist Meetings: Never    Marital Status: Divorced    Allergies:  Allergies  Allergen Reactions   Prolixin Decanoate [Fluphenazine]     Ineffective    Benztropine Other (See Comments)    Hair fall out  Suicide thoughts   Benztropine Mesylate Other (See Comments)   Buprenorphine Hcl Other (See Comments)    Unable to void   Carbidopa-Levodopa Nausea Only   Codeine Hives   Cymbalta [Duloxetine Hcl] Other (See Comments)    Altered mental status,  Alopecia, visual hallucinations, nightmares   Gabapentin Swelling   Lyrica [Pregabalin] Other (See Comments)    Alopecia, visual hallucinations, nightmares Altered mental status   Morphine And Related Other (See Comments)    Unable to void   Nortripytline Hcl [Nortriptyline] Other (See Comments)    Hair loss and night mares    Nsaids     REACTION: palpitations, diaphoresis   Other Other (See Comments)   Penicillins Nausea And Vomiting and Other (See Comments)    REACTION: upset stomach Has patient had a PCN reaction causing immediate rash, facial/tongue/throat swelling, SOB or lightheadedness with hypotension: No Has patient had a PCN reaction causing severe rash involving mucus membranes or skin necrosis: No Has patient had a PCN reaction that required hospitalization: No Has patient had a PCN reaction occurring within the last 10 years: No If all of the above answers are "NO", then may proceed with Cephalosporin use.   Risperidone And Related Other (See Comments)    tremors   Wellbutrin [Bupropion]     Constipation, mood swings   Tolmetin Other (See Comments) and Palpitations    REACTION: palpitations, diaphoresis    Metabolic Disorder Labs: Lab Results  Component Value Date   HGBA1C 6.2 (H) 03/06/2017   MPG 131.24 03/06/2017   MPG 108 05/17/2015   Lab Results  Component Value Date   PROLACTIN 4.5 (L) 06/13/2020   PROLACTIN 64.2 (H) 03/06/2017   Lab Results  Component Value Date   CHOL 146 08/22/2018   TRIG 172 (H) 08/22/2018   HDL 49 08/22/2018   CHOLHDL 3.0 08/22/2018   VLDL 34 08/22/2018   LDLCALC 63 08/22/2018   LDLCALC 59 03/06/2017   Lab Results  Component Value Date   TSH 0.959 08/22/2018   TSH 2.766 08/24/2017    Therapeutic Level Labs: No results found for: "LITHIUM" No results found for: "VALPROATE" No results found for: "CBMZ"  Current Medications: Current Outpatient Medications  Medication Sig Dispense Refill   ACCU-CHEK GUIDE test strip       acetaminophen (TYLENOL) 500 MG tablet Take 1,000 mg by mouth every 6 (six) hours as needed for moderate pain or headache.      albuterol (PROVENTIL HFA;VENTOLIN HFA) 108 (90 Base) MCG/ACT inhaler Inhale 2 puffs  into the lungs every 6 (six) hours as needed for wheezing or shortness of breath.     Alcohol Swabs (B-D SINGLE USE SWABS REGULAR) PADS      amitriptyline (ELAVIL) 50 MG tablet TAKE 1 AND 1/2 TABLETS AT BEDTIME 135 tablet 0   aspirin EC 81 MG tablet Take 81 mg by mouth daily. Swallow whole.     B-D ULTRAFINE III SHORT PEN 31G X 8 MM MISC Inject into the skin.     Blood Glucose Calibration (ACCU-CHEK AVIVA) SOLN      Blood Glucose Monitoring Suppl (ACCU-CHEK AVIVA PLUS) w/Device KIT      buprenorphine (BUTRANS) 20 MCG/HR PTWK Place onto the skin.     butalbital-acetaminophen-caffeine (FIORICET) 50-325-40 MG tablet      carbidopa-levodopa (SINEMET CR) 50-200 MG tablet Take 1 tablet by mouth at bedtime.     carbidopa-levodopa (SINEMET IR) 25-100 MG tablet Take 1 tablet by mouth 3 (three) times daily.     Cholecalciferol (VITAMIN D-3) 125 MCG (5000 UT) TABS Take 5,000 Units by mouth daily.     furosemide (LASIX) 40 MG tablet Take 40 mg by mouth as directed. Takes every other day     glimepiride (AMARYL) 2 MG tablet Take by mouth.     insulin glargine (LANTUS SOLOSTAR) 100 UNIT/ML Solostar Pen Inject into the skin.     insulin NPH-regular Human (NOVOLIN 70/30) (70-30) 100 UNIT/ML injection Inject 15 units subcutaneously twice daily with meals     metFORMIN (GLUCOPHAGE) 500 MG tablet Take by mouth.     montelukast (SINGULAIR) 10 MG tablet Take 10 mg by mouth at bedtime.     OLANZapine (ZYPREXA) 5 MG tablet Take 0.5 tablets (2.5 mg total) by mouth at bedtime. 90 tablet 3   Omega-3 Fatty Acids (FISH OIL) 1000 MG CAPS Take by mouth.     omeprazole (PRILOSEC) 40 MG capsule      pravastatin (PRAVACHOL) 40 MG tablet Take by mouth.     QULIPTA 60 MG TABS      RELION INSULIN SYRINGE 31G X 15/64"  1 ML MISC      rizatriptan (MAXALT) 10 MG tablet Take 10 mg by mouth 2 (two) times daily as needed for migraine. May repeat in 2 hours if needed     rOPINIRole (REQUIP) 0.5 MG tablet Take 0.5 mg by mouth 3 (three) times daily.     torsemide (DEMADEX) 20 MG tablet Take 20 mg by mouth daily.     traMADol (ULTRAM) 50 MG tablet Take 50 mg by mouth daily as needed for severe pain.     verapamil (CALAN-SR) 120 MG CR tablet Take 120 mg by mouth at bedtime.     levothyroxine (SYNTHROID) 50 MCG tablet Take by mouth.     No current facility-administered medications for this visit.     Musculoskeletal: Strength & Muscle Tone: within normal limits Gait & Station: normal Patient leans: N/A  Psychiatric Specialty Exam: Review of Systems  Constitutional:  Positive for fatigue.  Neurological:  Positive for light-headedness and headaches.  Psychiatric/Behavioral:  The patient is nervous/anxious.   All other systems reviewed and are negative.   Blood pressure 139/85, pulse 88, temperature 98.1 F (36.7 C), temperature source Skin, height 5\' 4"  (1.626 m), weight 251 lb 3.2 oz (113.9 kg).Body mass index is 43.12 kg/m.  General Appearance: Casual  Eye Contact:  Good  Speech:  Clear and Coherent  Volume:  Normal  Mood:  Anxious  Affect:  Congruent  Thought Process:  Goal Directed and Descriptions of Associations: Intact  Orientation:  Full (Time, Place, and Person)  Thought Content: Logical   Suicidal Thoughts:  No  Homicidal Thoughts:  No  Memory:  Immediate;   Fair Recent;   Fair Remote;   Limited  Judgement:  Fair  Insight:  Fair  Psychomotor Activity:  Normal  Concentration:  Concentration: Fair and Attention Span: Fair  Recall:  [employer] of Knowledge: Fair  Language: Fair  Akathisia:  No  Handed:  Right  AIMS (if indicated): done  Assets:  Communication Skills Desire for Improvement Housing Transportation  ADL's:  Intact  Cognition: WNL  Sleep:  Fair   Screenings: Development worker, community Visit from 04/27/2022 in [facility] Office Visit from 03/16/2022 in [facility] Office Visit from 12/14/2021 in [facility] Office Visit from 08/28/2021 in [facility] Office Visit from 07/30/2021 in [facility] Total Score 0 0 0 0 0      AUDIT    Flowsheet Row Admission (Discharged) from OP Visit from 08/30/2018 in [facility] Admission (Discharged) from 08/22/2018 in [facility] Admission (Discharged) from 03/03/2017 in [facility]  Alcohol Use Disorder Identification Test Final Score (AUDIT) 0 0 0      GAD-7    Flowsheet Row Office Visit from 04/27/2022 in [facility] Office Visit from 03/16/2022 in [facility] Office Visit from 12/14/2021 in [facility] Office Visit from 08/28/2021 in [facility] Office Visit from 11/13/2020 in [facility]  Total GAD-7 Score 5 5 8 5 3       PHQ2-9    [facility] Office Visit from 04/27/2022 in [facility] Office Visit from 03/16/2022 in [facility] Office Visit from 12/14/2021 in [facility] Office Visit from 08/28/2021 in [facility] Office Visit from 07/30/2021 in [facility]  PHQ-2 Total Score 0 0 1 0 0  PHQ-9 Total Score 8 6 -- -- --      [facility] Office Visit from 04/27/2022 in [facility] Office  Visit from 03/16/2022 in [facility] Office Visit from 12/14/2021 in [facility] No Risk No Risk No Risk        Assessment and Plan: Heather Haley is a 62 year old Caucasian female who has a history of schizoaffective disorder, PTSD, migraine headaches, drug-induced Parkinson's disease was evaluated in office today.  Patient with multiple health problems including recent lightheadedness, constant headaches, although reports mood symptoms as currently stable, will benefit from the following plan.  Plan Schizoaffective disorder-stable Continue Zyprexa at reduced dosage of 2.5 mg p.o. nightly  GAD-stable Elavil 75 mg p.o. nightly   PTSD-stable Elavil 75 mg p.o. nightly   Drug-induced Parkinson's disease-stable Continue carbidopa/levodopa per primary provider/neurology  Insomnia-stable Continue amitriptyline/elavil. Patient however does report fatigue, tiredness during the day.  Patient advised to monitor her blood pressure at home, keep a log and follow-up with primary care provider.  Unsure what could have caused her recent lightheadedness.  Collaboration of Care: Collaboration of  Care: Other will coordinate care with Dr. Ginette Pitman, patient encouraged to follow up.  Patient/Guardian was advised Release of Information must be obtained prior to any record release in order to collaborate their care with an outside provider. Patient/Guardian was advised if they have not already done so to contact the registration department to sign all necessary forms in order for [place] to release information regarding their care.   Consent: Patient/Guardian gives verbal consent for treatment and assignment of benefits for services provided during this visit. Patient/Guardian expressed understanding and agreed to proceed.   Follow-up in clinic in 3 months or sooner if needed.  This note was generated in  part or whole with voice recognition software. Voice recognition is usually quite accurate but there are transcription errors that can and very often do occur. I apologize for any typographical errors that were not detected and corrected.    Ursula Alert, MD 04/29/2022, 8:35 AM

## 2022-05-11 DIAGNOSIS — R0789 Other chest pain: Secondary | ICD-10-CM | POA: Diagnosis not present

## 2022-05-11 DIAGNOSIS — R0602 Shortness of breath: Secondary | ICD-10-CM | POA: Diagnosis not present

## 2022-05-11 DIAGNOSIS — I10 Essential (primary) hypertension: Secondary | ICD-10-CM | POA: Diagnosis not present

## 2022-05-11 DIAGNOSIS — R002 Palpitations: Secondary | ICD-10-CM | POA: Diagnosis not present

## 2022-05-11 DIAGNOSIS — E7800 Pure hypercholesterolemia, unspecified: Secondary | ICD-10-CM | POA: Diagnosis not present

## 2022-05-11 DIAGNOSIS — E119 Type 2 diabetes mellitus without complications: Secondary | ICD-10-CM | POA: Diagnosis not present

## 2022-05-11 DIAGNOSIS — R079 Chest pain, unspecified: Secondary | ICD-10-CM | POA: Diagnosis not present

## 2022-05-12 ENCOUNTER — Ambulatory Visit: Payer: Medicare HMO | Admitting: Psychiatry

## 2022-05-18 ENCOUNTER — Encounter: Payer: Self-pay | Admitting: *Deleted

## 2022-05-24 ENCOUNTER — Encounter: Payer: Self-pay | Admitting: *Deleted

## 2022-05-25 ENCOUNTER — Ambulatory Visit: Payer: Medicare HMO | Admitting: Certified Registered Nurse Anesthetist

## 2022-05-25 ENCOUNTER — Encounter
Admission: RE | Disposition: A | Discharge status: Home / Self Care | Payer: Self-pay | Source: Home / Self Care | Attending: Gastroenterology

## 2022-05-25 ENCOUNTER — Encounter: Payer: Self-pay | Admitting: *Deleted

## 2022-05-25 ENCOUNTER — Ambulatory Visit
Admission: RE | Admit: 2022-05-25 | Discharge: 2022-05-25 | Disposition: A | Discharge status: Home / Self Care | Payer: Medicare HMO | Attending: Gastroenterology | Admitting: Gastroenterology

## 2022-05-25 DIAGNOSIS — E669 Obesity, unspecified: Secondary | ICD-10-CM | POA: Diagnosis not present

## 2022-05-25 DIAGNOSIS — Z6841 Body Mass Index (BMI) 40.0 and over, adult: Secondary | ICD-10-CM | POA: Insufficient documentation

## 2022-05-25 DIAGNOSIS — F209 Schizophrenia, unspecified: Secondary | ICD-10-CM | POA: Insufficient documentation

## 2022-05-25 DIAGNOSIS — I129 Hypertensive chronic kidney disease with stage 1 through stage 4 chronic kidney disease, or unspecified chronic kidney disease: Secondary | ICD-10-CM | POA: Insufficient documentation

## 2022-05-25 DIAGNOSIS — Z800 Family history of malignant neoplasm of digestive organs: Secondary | ICD-10-CM | POA: Diagnosis not present

## 2022-05-25 DIAGNOSIS — Z86010 Personal history of colonic polyps: Secondary | ICD-10-CM | POA: Diagnosis not present

## 2022-05-25 DIAGNOSIS — K649 Unspecified hemorrhoids: Secondary | ICD-10-CM | POA: Diagnosis not present

## 2022-05-25 DIAGNOSIS — G8929 Other chronic pain: Secondary | ICD-10-CM | POA: Diagnosis not present

## 2022-05-25 DIAGNOSIS — K5939 Other megacolon: Secondary | ICD-10-CM | POA: Diagnosis not present

## 2022-05-25 DIAGNOSIS — N182 Chronic kidney disease, stage 2 (mild): Secondary | ICD-10-CM | POA: Diagnosis not present

## 2022-05-25 DIAGNOSIS — N189 Chronic kidney disease, unspecified: Secondary | ICD-10-CM | POA: Diagnosis not present

## 2022-05-25 DIAGNOSIS — K219 Gastro-esophageal reflux disease without esophagitis: Secondary | ICD-10-CM | POA: Diagnosis not present

## 2022-05-25 DIAGNOSIS — K2970 Gastritis, unspecified, without bleeding: Secondary | ICD-10-CM | POA: Diagnosis not present

## 2022-05-25 DIAGNOSIS — Z1211 Encounter for screening for malignant neoplasm of colon: Secondary | ICD-10-CM | POA: Insufficient documentation

## 2022-05-25 DIAGNOSIS — E1122 Type 2 diabetes mellitus with diabetic chronic kidney disease: Secondary | ICD-10-CM | POA: Diagnosis not present

## 2022-05-25 DIAGNOSIS — Z87891 Personal history of nicotine dependence: Secondary | ICD-10-CM | POA: Diagnosis not present

## 2022-05-25 DIAGNOSIS — Z794 Long term (current) use of insulin: Secondary | ICD-10-CM | POA: Diagnosis not present

## 2022-05-25 DIAGNOSIS — Z7984 Long term (current) use of oral hypoglycemic drugs: Secondary | ICD-10-CM | POA: Insufficient documentation

## 2022-05-25 DIAGNOSIS — G20A1 Parkinson's disease without dyskinesia, without mention of fluctuations: Secondary | ICD-10-CM | POA: Insufficient documentation

## 2022-05-25 DIAGNOSIS — Q399 Congenital malformation of esophagus, unspecified: Secondary | ICD-10-CM | POA: Insufficient documentation

## 2022-05-25 DIAGNOSIS — K640 First degree hemorrhoids: Secondary | ICD-10-CM | POA: Diagnosis not present

## 2022-05-25 DIAGNOSIS — R1310 Dysphagia, unspecified: Secondary | ICD-10-CM | POA: Insufficient documentation

## 2022-05-25 DIAGNOSIS — K31A19 Gastric intestinal metaplasia without dysplasia, unspecified site: Secondary | ICD-10-CM | POA: Diagnosis not present

## 2022-05-25 DIAGNOSIS — E1140 Type 2 diabetes mellitus with diabetic neuropathy, unspecified: Secondary | ICD-10-CM | POA: Diagnosis not present

## 2022-05-25 DIAGNOSIS — Z09 Encounter for follow-up examination after completed treatment for conditions other than malignant neoplasm: Secondary | ICD-10-CM | POA: Diagnosis not present

## 2022-05-25 HISTORY — PX: ESOPHAGOGASTRODUODENOSCOPY (EGD) WITH PROPOFOL: SHX5813

## 2022-05-25 HISTORY — PX: COLONOSCOPY WITH PROPOFOL: SHX5780

## 2022-05-25 LAB — GLUCOSE, CAPILLARY: Glucose-Capillary: 144 mg/dL — ABNORMAL HIGH (ref 70–99)

## 2022-05-25 SURGERY — COLONOSCOPY WITH PROPOFOL
Anesthesia: General

## 2022-05-25 MED ORDER — PROPOFOL 500 MG/50ML IV EMUL
INTRAVENOUS | Status: DC | PRN
  Administered 2022-05-25: 80 ug/kg/min via INTRAVENOUS

## 2022-05-25 MED ORDER — PROPOFOL 1000 MG/100ML IV EMUL
INTRAVENOUS | Status: AC
  Filled 2022-05-25: qty 100

## 2022-05-25 MED ORDER — PROPOFOL 10 MG/ML IV BOLUS
INTRAVENOUS | Status: AC
  Filled 2022-05-25: qty 120

## 2022-05-25 MED ORDER — ONDANSETRON HCL 4 MG/2ML IJ SOLN
INTRAMUSCULAR | Status: DC | PRN
  Administered 2022-05-25: 4 mg via INTRAVENOUS

## 2022-05-25 MED ORDER — MIDAZOLAM HCL 2 MG/2ML IJ SOLN
INTRAMUSCULAR | Status: AC
  Filled 2022-05-25: qty 2

## 2022-05-25 MED ORDER — PROPOFOL 10 MG/ML IV BOLUS
INTRAVENOUS | Status: DC | PRN
  Administered 2022-05-25: 70 mg via INTRAVENOUS

## 2022-05-25 MED ORDER — LIDOCAINE HCL (CARDIAC) PF 100 MG/5ML IV SOSY
PREFILLED_SYRINGE | INTRAVENOUS | Status: DC | PRN
  Administered 2022-05-25: 100 mg via INTRAVENOUS

## 2022-05-25 MED ORDER — PROPOFOL 10 MG/ML IV BOLUS
INTRAVENOUS | Status: AC
  Filled 2022-05-25: qty 80

## 2022-05-25 MED ORDER — SODIUM CHLORIDE 0.9 % IV SOLN
INTRAVENOUS | Status: DC
  Administered 2022-05-25: 1000 mL via INTRAVENOUS

## 2022-05-25 NOTE — Transfer of Care (Signed)
Immediate Anesthesia Transfer of Care Note  Patient: Heather Haley  Procedure(s) Performed: COLONOSCOPY WITH PROPOFOL ESOPHAGOGASTRODUODENOSCOPY (EGD) WITH PROPOFOL  Patient Location: Endoscopy Unit  Anesthesia Type:General  Level of Consciousness: drowsy  Airway & Oxygen Therapy: Patient Spontanous Breathing  Post-op Assessment: Report given to RN and Post -op Vital signs reviewed and stable  Post vital signs: Reviewed and stable  Last Vitals:  Vitals Value Taken Time  BP    Temp    Pulse 97 05/25/22 0832  Resp 22 05/25/22 0832  SpO2 95 % 05/25/22 0832  Vitals shown include unvalidated device data.  Last Pain:  Vitals:   05/25/22 0703  TempSrc: Temporal         Complications: No notable events documented.

## 2022-05-25 NOTE — Op Note (Signed)
[facility] Patient Name: Heather Haley Procedure Date: 05/25/2022 7:51 AM MRN: [id number] Account #: [id number] Date of Birth: 04-29-1960 Admit Type: Outpatient Age: 62 Room: [facility] ENDO ROOM 1 Gender: Female Note Status: Finalized Instrument Name: Prentice Docker [id number] Procedure:             Colonoscopy Indications:           High risk colon cancer surveillance: Personal history                         of colonic polyps, Last colonoscopy 5 years ago Providers:             Eather Colas MD, MD Referring MD:          Barbette Reichmann, MD (Referring MD) Medicines:             Monitored Anesthesia Care Complications:         No immediate complications. Procedure:             Pre-Anesthesia Assessment:                        - Prior to the procedure, a History and Physical was                         performed, and patient medications and allergies were                         reviewed. The patient is competent. The risks and                         benefits of the procedure and the sedation options and                         risks were discussed with the patient. All questions                         were answered and informed consent was obtained.                         Patient identification and proposed procedure were                         verified by the physician, the nurse, the                         anesthesiologist, the anesthetist and the technician                         in the endoscopy suite. Mental Status Examination:                         alert and oriented. Airway Examination: normal                         oropharyngeal airway and neck mobility. Respiratory                         Examination: clear to auscultation. CV Examination:  normal. Prophylactic Antibiotics: The patient does not                         require prophylactic antibiotics. Prior                         Anticoagulants: The patient  has taken no anticoagulant                         or antiplatelet agents. ASA Grade Assessment: III - A                         patient with severe systemic disease. After reviewing                         the risks and benefits, the patient was deemed in                         satisfactory condition to undergo the procedure. The                         anesthesia plan was to use monitored anesthesia care                         (MAC). Immediately prior to administration of                         medications, the patient was re-assessed for adequacy                         to receive sedatives. The heart rate, respiratory                         rate, oxygen saturations, blood pressure, adequacy of                         pulmonary ventilation, and response to care were                         monitored throughout the procedure. The physical                         status of the patient was re-assessed after the                         procedure.                        After obtaining informed consent, the colonoscope was                         passed under direct vision. Throughout the procedure,                         the patient's blood pressure, pulse, and oxygen                         saturations were monitored continuously. The  Colonoscope was introduced through the anus and                         advanced to the the cecum, identified by appendiceal                         orifice and ileocecal valve. The colonoscopy was                         somewhat difficult due to a redundant colon and                         significant looping. Successful completion of the                         procedure was aided by applying abdominal pressure.                         The patient tolerated the procedure well. The quality                         of the bowel preparation was fair. The ileocecal                         valve, appendiceal orifice, and rectum were                          photographed. Findings:      The perianal and digital rectal examinations were normal.      The lumen of the colon (entire examined portion) was mildly dilated.      Internal hemorrhoids were found during retroflexion. The hemorrhoids       were Grade I (internal hemorrhoids that do not prolapse). Impression:            - Preparation of the colon was fair.                        - Dilated in the entire examined colon.                        - Internal hemorrhoids.                        - No specimens collected. Recommendation:        - Repeat colonoscopy in 1 year because the bowel                         preparation was suboptimal.                        - Return to referring physician as previously                         scheduled.                        - Discharge patient to home.                        - Resume previous diet.                        -  Continue present medications. Procedure Code(s):     --- Professional ---                        B1478, Colorectal cancer screening; colonoscopy on                         individual at high risk Diagnosis Code(s):     --- Professional ---                        Z86.010, Personal history of colonic polyps                        K64.0, First degree hemorrhoids                        K59.39, Other megacolon CPT copyright 2022 American Medical Association. All rights reserved. The codes documented in this report are preliminary and upon coder review may  be revised to meet current compliance requirements. Eather Colas MD, MD 05/25/2022 8:39:26 AM Number of Addenda: 0 Note Initiated On: 05/25/2022 7:51 AM Total Procedure Duration: 0 hours 23 minutes 12 seconds  Estimated Blood Loss:  Estimated blood loss: none.      [facility]

## 2022-05-25 NOTE — Interval H&P Note (Signed)
History and Physical Interval Note:  05/25/2022 7:52 AM  Heather Haley  has presented today for surgery, with the diagnosis of GERD Dysphagia H/O Colon Polyps.  The various methods of treatment have been discussed with the patient and family. After consideration of risks, benefits and other options for treatment, the patient has consented to  Procedure(s): COLONOSCOPY WITH PROPOFOL (N/A) ESOPHAGOGASTRODUODENOSCOPY (EGD) WITH PROPOFOL (N/A) as a surgical intervention.  The patient's history has been reviewed, patient examined, no change in status, stable for surgery.  I have reviewed the patient's chart and labs.  Questions were answered to the patient's satisfaction.     [name]  Ok to proceed with EGD/Colonoscopy

## 2022-05-25 NOTE — Anesthesia Preprocedure Evaluation (Signed)
Anesthesia Evaluation  Patient identified by MRN, date of birth, ID band Patient awake    Reviewed: Allergy & Precautions, NPO status , Patient's Chart, lab work & pertinent test results  History of Anesthesia Complications (+) PONV and history of anesthetic complications  Airway Mallampati: III  TM Distance: >3 FB Neck ROM: Full    Dental  (+) Poor Dentition, Dental Advidsory Given   Pulmonary neg shortness of breath, neg sleep apnea, neg COPD, neg recent URI, former smoker   breath sounds clear to auscultation- rhonchi (-) wheezing      Cardiovascular hypertension, Pt. on medications (-) angina (-) CAD, (-) Past MI, (-) Cardiac Stents and (-) CABG (-) dysrhythmias  Rhythm:Regular Rate:Normal - Systolic murmurs and - Diastolic murmurs    Neuro/Psych  Headaches, neg Seizures PSYCHIATRIC DISORDERS Anxiety Depression  Schizophrenia   Neuromuscular disease    GI/Hepatic Neg liver ROS,GERD  ,,  Endo/Other  diabetes, Oral Hypoglycemic Agents  Morbid obesity  Renal/GU Renal InsufficiencyRenal disease     Musculoskeletal  (+) Arthritis , Osteoarthritis,    Abdominal  (+) + obese  Peds  Hematology negative hematology ROS (+)   Anesthesia Other Findings Past Medical History: No date: Anxiety No date: Chronic kidney disease No date: Chronic pain     Comment:  previously saw Dr. Shireen Quan in pain clinic, then saw pain              specialist in [facility] No date: Depression No date: Diabetes mellitus (HCC) No date: Frequency of urination No date: GERD (gastroesophageal reflux disease) No date: Headache(784.0) No date: High cholesterol No date: Hypertension No date: IBS (irritable bowel syndrome) No date: Left ankle instability No date: Left knee DJD 10/11/2014: Lumbar Degenerative Disc Disease of  No date: Neuromuscular disorder (HCC) 05/05/2015: Osteoarthritis of hip (Right) 12/15/2009: Other enthesopathy of ankle and  tarsus     Comment:  Qualifier: Diagnosis of  By: FIELDS MD, KARL   11/19/2014: Peripheral sensory neuropathy (Bilateral) No date: Postoperative nausea and vomiting No date: Schizophrenia (HCC)   Reproductive/Obstetrics                             Anesthesia Physical Anesthesia Plan  ASA: 3  Anesthesia Plan: General   Post-op Pain Management:    Induction: Intravenous  PONV Risk Score and Plan: 3 and Propofol infusion and TIVA  Airway Management Planned: Natural Airway and Nasal Cannula  Additional Equipment:   Intra-op Plan:   Post-operative Plan:   Informed Consent: I have reviewed the patients History and Physical, chart, labs and discussed the procedure including the risks, benefits and alternatives for the proposed anesthesia with the patient or authorized representative who has indicated his/her understanding and acceptance.     Dental advisory given  Plan Discussed with: CRNA and Anesthesiologist  Anesthesia Plan Comments:         Anesthesia Quick Evaluation

## 2022-05-25 NOTE — Op Note (Signed)
[facility] Gastroenterology Patient Name: Heather Haley Procedure Date: 05/25/2022 7:51 AM MRN: [id number] Account #: [id number] Date of Birth: Apr 05, 1960 Admit Type: Outpatient Age: 62 Room: [facility] ENDO ROOM 1 Gender: Female Note Status: Finalized Instrument Name: Laurette Schimke [id number] Procedure:             Upper GI endoscopy Indications:           Dysphagia Providers:             Eather Colas MD, MD Referring MD:          Barbette Reichmann, MD (Referring MD) Medicines:             Monitored Anesthesia Care Complications:         No immediate complications. Estimated blood loss:                         Minimal. Procedure:             Pre-Anesthesia Assessment:                        - Prior to the procedure, a History and Physical was                         performed, and patient medications and allergies were                         reviewed. The patient is competent. The risks and                         benefits of the procedure and the sedation options and                         risks were discussed with the patient. All questions                         were answered and informed consent was obtained.                         Patient identification and proposed procedure were                         verified by the physician, the nurse, the                         anesthesiologist, the anesthetist and the technician                         in the endoscopy suite. Mental Status Examination:                         alert and oriented. Airway Examination: normal                         oropharyngeal airway and neck mobility. Respiratory                         Examination: clear to auscultation. CV Examination:  normal. Prophylactic Antibiotics: The patient does not                         require prophylactic antibiotics. Prior                         Anticoagulants: The patient has taken no anticoagulant                          or antiplatelet agents. ASA Grade Assessment: III - A                         patient with severe systemic disease. After reviewing                         the risks and benefits, the patient was deemed in                         satisfactory condition to undergo the procedure. The                         anesthesia plan was to use monitored anesthesia care                         (MAC). Immediately prior to administration of                         medications, the patient was re-assessed for adequacy                         to receive sedatives. The heart rate, respiratory                         rate, oxygen saturations, blood pressure, adequacy of                         pulmonary ventilation, and response to care were                         monitored throughout the procedure. The physical                         status of the patient was re-assessed after the                         procedure.                        After obtaining informed consent, the endoscope was                         passed under direct vision. Throughout the procedure,                         the patient's blood pressure, pulse, and oxygen                         saturations were monitored continuously. The Endoscope  was introduced through the mouth, and advanced to the                         second part of duodenum. The upper GI endoscopy was                         accomplished without difficulty. The patient tolerated                         the procedure well. Findings:      The examined esophagus was moderately tortuous.      There is no endoscopic evidence of stricture in the entire esophagus.      Patchy mild inflammation characterized by erythema was found in the       gastric antrum. Biopsies were taken with a cold forceps for Helicobacter       pylori testing. Estimated blood loss was minimal.      The examined duodenum was normal. Impression:            - Tortuous  esophagus.                        - Gastritis. Biopsied.                        - Normal examined duodenum. Recommendation:        - Await pathology results.                        - Perform a colonoscopy today. Procedure Code(s):     --- Professional ---                        [phone], Esophagogastroduodenoscopy, flexible,                         transoral; with biopsy, single or multiple Diagnosis Code(s):     --- Professional ---                        Q39.9, Congenital malformation of esophagus,                         unspecified                        K29.70, Gastritis, unspecified, without bleeding                        R13.10, Dysphagia, unspecified CPT copyright 2022 American Medical Association. All rights reserved. The codes documented in this report are preliminary and upon coder review may  be revised to meet current compliance requirements. Eather Colas MD, MD 05/25/2022 8:36:32 AM Number of Addenda: 0 Note Initiated On: 05/25/2022 7:51 AM Estimated Blood Loss:  Estimated blood loss was minimal.      [facility]

## 2022-05-25 NOTE — Anesthesia Postprocedure Evaluation (Signed)
Anesthesia Post Note  Patient: ARAYA ROEL  Procedure(s) Performed: COLONOSCOPY WITH PROPOFOL ESOPHAGOGASTRODUODENOSCOPY (EGD) WITH PROPOFOL  Patient location during evaluation: Endoscopy Anesthesia Type: General Level of consciousness: awake and alert Pain management: pain level controlled Vital Signs Assessment: post-procedure vital signs reviewed and stable Respiratory status: spontaneous breathing, nonlabored ventilation, respiratory function stable and patient connected to nasal cannula oxygen Cardiovascular status: blood pressure returned to baseline and stable Postop Assessment: no apparent nausea or vomiting Anesthetic complications: no   No notable events documented.   Last Vitals:  Vitals:   05/25/22 0842 05/25/22 0852  BP: 139/84 (!) 147/76  Pulse: 95 91  Resp: (!) 22 17  Temp:    SpO2: 96% 96%    Last Pain:  Vitals:   05/25/22 0852  TempSrc:   PainSc: 0-No pain                 Lenard Simmer

## 2022-05-25 NOTE — H&P (Signed)
Outpatient short stay form Pre-procedure 05/25/2022  Heather Bill, MD  Primary Physician: Heather Reichmann, MD  Reason for visit:  Dysphagia/History of polyps/Left lower quadrant pain  History of present illness:    62 y/o lady with multiple medical problems including obesity, DM II, parkinson's, chronic pain, and schizophrenia here for EGD/Colonoscopy for dysphagia and abdominal pain. No blood thinners. Mother with colon cancer in her 72's.     Current Facility-Administered Medications:    0.9 %  sodium chloride infusion, , Intravenous, Continuous, Heather Haley, Heather Muskrat, MD, Last Rate: 20 mL/hr at 05/25/22 0717, 1,000 mL at 05/25/22 0717  Medications Prior to Admission  Medication Sig Dispense Refill Last Dose   ACCU-CHEK GUIDE test strip    05/25/2022   Alcohol Swabs (B-D SINGLE USE SWABS REGULAR) PADS    05/25/2022   amitriptyline (ELAVIL) 50 MG tablet TAKE 1 AND 1/2 TABLETS AT BEDTIME 135 tablet 0 05/24/2022   aspirin EC 81 MG tablet Take 81 mg by mouth daily. Swallow whole.   05/24/2022   B-D ULTRAFINE III SHORT PEN 31G X 8 MM MISC Inject into the skin.   05/25/2022   Blood Glucose Calibration (ACCU-CHEK AVIVA) SOLN    05/25/2022   Blood Glucose Monitoring Suppl (ACCU-CHEK AVIVA PLUS) w/Device KIT    05/25/2022   buprenorphine (BUTRANS) 20 MCG/HR PTWK Place onto the skin.   05/24/2022   carbidopa-levodopa (SINEMET CR) 50-200 MG tablet Take 1 tablet by mouth at bedtime.   05/24/2022   carbidopa-levodopa (SINEMET IR) 25-100 MG tablet Take 1 tablet by mouth 3 (three) times daily.   05/24/2022   Cholecalciferol (VITAMIN D-3) 125 MCG (5000 UT) TABS Take 5,000 Units by mouth daily.   05/24/2022   furosemide (LASIX) 40 MG tablet Take 40 mg by mouth as directed. Takes every other day   05/24/2022   glimepiride (AMARYL) 2 MG tablet Take by mouth.   05/24/2022   insulin glargine (LANTUS SOLOSTAR) 100 UNIT/ML Solostar Pen Inject into the skin.   05/24/2022   insulin NPH-regular Human (NOVOLIN 70/30)  (70-30) 100 UNIT/ML injection Inject 15 units subcutaneously twice daily with meals   05/24/2022   metFORMIN (GLUCOPHAGE) 500 MG tablet Take by mouth.   05/24/2022   montelukast (SINGULAIR) 10 MG tablet Take 10 mg by mouth at bedtime.   05/24/2022   OLANZapine (ZYPREXA) 5 MG tablet Take 0.5 tablets (2.5 mg total) by mouth at bedtime. 90 tablet 3 05/24/2022   Omega-3 Fatty Acids (FISH OIL) 1000 MG CAPS Take by mouth.   05/24/2022   omeprazole (PRILOSEC) 40 MG capsule    05/24/2022   pravastatin (PRAVACHOL) 40 MG tablet Take by mouth.   05/24/2022   QULIPTA 60 MG TABS    05/24/2022   rOPINIRole (REQUIP) 0.5 MG tablet Take 0.5 mg by mouth 3 (three) times daily.   05/24/2022   torsemide (DEMADEX) 20 MG tablet Take 20 mg by mouth daily.   05/24/2022   traMADol (ULTRAM) 50 MG tablet Take 50 mg by mouth daily as needed for severe pain.   05/24/2022   verapamil (CALAN-SR) 120 MG CR tablet Take 120 mg by mouth at bedtime.   05/24/2022   acetaminophen (TYLENOL) 500 MG tablet Take 1,000 mg by mouth every 6 (six) hours as needed for moderate pain or headache.     at prn   albuterol (PROVENTIL HFA;VENTOLIN HFA) 108 (90 Base) MCG/ACT inhaler Inhale 2 puffs into the lungs every 6 (six) hours as needed for wheezing or shortness of  breath.    at prn   butalbital-acetaminophen-caffeine (FIORICET) 50-325-40 MG tablet     at prn   levothyroxine (SYNTHROID) 50 MCG tablet Take by mouth.      RELION INSULIN SYRINGE 31G X 15/64" 1 ML MISC       rizatriptan (MAXALT) 10 MG tablet Take 10 mg by mouth 2 (two) times daily as needed for migraine. May repeat in 2 hours if needed    at prn     Allergies  Allergen Reactions   Prolixin Decanoate [Fluphenazine]     Ineffective    Benztropine Other (See Comments)    Hair fall out  Suicide thoughts   Benztropine Mesylate Other (See Comments)   Buprenorphine Hcl Other (See Comments)    Unable to void   Carbidopa-Levodopa Nausea Only   Codeine Hives   Cymbalta [Duloxetine Hcl] Other  (See Comments)    Altered mental status, Alopecia, visual hallucinations, nightmares   Gabapentin Swelling   Lyrica [Pregabalin] Other (See Comments)    Alopecia, visual hallucinations, nightmares Altered mental status   Morphine And Related Other (See Comments)    Unable to void   Nortripytline Hcl [Nortriptyline] Other (See Comments)    Hair loss and night mares    Nsaids     REACTION: palpitations, diaphoresis   Other Other (See Comments)   Penicillins Nausea And Vomiting and Other (See Comments)    REACTION: upset stomach Has patient had a PCN reaction causing immediate rash, facial/tongue/throat swelling, SOB or lightheadedness with hypotension: No Has patient had a PCN reaction causing severe rash involving mucus membranes or skin necrosis: No Has patient had a PCN reaction that required hospitalization: No Has patient had a PCN reaction occurring within the last 10 years: No If all of the above answers are "NO", then may proceed with Cephalosporin use.   Risperidone And Related Other (See Comments)    tremors   Wellbutrin [Bupropion]     Constipation, mood swings   Tolmetin Other (See Comments) and Palpitations    REACTION: palpitations, diaphoresis     Past Medical History:  Diagnosis Date   Anxiety    Asthma    Chronic kidney disease    Chronic pain    previously saw Dr. Shireen Haley in pain clinic, then saw pain specialist in [facility]   Depression    Diabetes mellitus    Frequency of urination    GERD (gastroesophageal reflux disease)    Headache(784.0)    High cholesterol    History of kidney stones    Hypertension    IBS (irritable bowel syndrome)    Left ankle instability    Left knee DJD    Lumbar Degenerative Disc Disease of  10/11/2014   Neuromuscular disorder    Osteoarthritis of hip (Right) 05/05/2015   Other enthesopathy of ankle and tarsus 12/15/2009   Qualifier: Diagnosis of  By: Heather Penna MD, Heather Haley     Parkinson's disease    Peripheral sensory  neuropathy (Bilateral) 11/19/2014   Postoperative nausea and vomiting    Schizophrenia     Review of systems:  Otherwise negative.    Physical Exam  Gen: Alert, oriented. Appears stated age.  HEENT: PERRLA. Lungs: No respiratory distress CV: RRR Abd: soft, benign, no masses Ext: No edema    Planned procedures: Proceed with EGD/colonoscopy. The patient understands the nature of the planned procedure, indications, risks, alternatives and potential complications including but not limited to bleeding, infection, perforation, damage to internal organs and possible oversedation/side effects  from anesthesia. The patient agrees and gives consent to proceed.  Please refer to procedure notes for findings, recommendations and patient disposition/instructions.     Heather Rubenstein, MD [facility]

## 2022-05-26 ENCOUNTER — Encounter: Payer: Self-pay | Admitting: Gastroenterology

## 2022-05-26 LAB — SURGICAL PATHOLOGY

## 2022-05-27 ENCOUNTER — Encounter: Payer: Self-pay | Admitting: Gastroenterology

## 2022-05-27 DIAGNOSIS — R002 Palpitations: Secondary | ICD-10-CM | POA: Diagnosis not present

## 2022-05-27 DIAGNOSIS — R0602 Shortness of breath: Secondary | ICD-10-CM | POA: Diagnosis not present

## 2022-05-31 ENCOUNTER — Other Ambulatory Visit: Payer: Self-pay

## 2022-05-31 DIAGNOSIS — E7800 Pure hypercholesterolemia, unspecified: Secondary | ICD-10-CM | POA: Diagnosis not present

## 2022-05-31 DIAGNOSIS — Z794 Long term (current) use of insulin: Secondary | ICD-10-CM | POA: Diagnosis not present

## 2022-05-31 DIAGNOSIS — I25110 Atherosclerotic heart disease of native coronary artery with unstable angina pectoris: Secondary | ICD-10-CM | POA: Diagnosis not present

## 2022-05-31 DIAGNOSIS — N1831 Chronic kidney disease, stage 3a: Secondary | ICD-10-CM | POA: Diagnosis not present

## 2022-05-31 DIAGNOSIS — R0609 Other forms of dyspnea: Secondary | ICD-10-CM | POA: Diagnosis not present

## 2022-05-31 DIAGNOSIS — E1165 Type 2 diabetes mellitus with hyperglycemia: Secondary | ICD-10-CM | POA: Diagnosis not present

## 2022-05-31 DIAGNOSIS — E1122 Type 2 diabetes mellitus with diabetic chronic kidney disease: Secondary | ICD-10-CM | POA: Diagnosis not present

## 2022-05-31 DIAGNOSIS — I10 Essential (primary) hypertension: Secondary | ICD-10-CM | POA: Diagnosis not present

## 2022-05-31 DIAGNOSIS — E782 Mixed hyperlipidemia: Secondary | ICD-10-CM | POA: Diagnosis not present

## 2022-05-31 DIAGNOSIS — Z6841 Body Mass Index (BMI) 40.0 and over, adult: Secondary | ICD-10-CM | POA: Diagnosis not present

## 2022-05-31 DIAGNOSIS — R079 Chest pain, unspecified: Secondary | ICD-10-CM | POA: Diagnosis not present

## 2022-05-31 DIAGNOSIS — R600 Localized edema: Secondary | ICD-10-CM | POA: Diagnosis not present

## 2022-06-03 ENCOUNTER — Other Ambulatory Visit: Payer: Self-pay | Admitting: Psychiatry

## 2022-06-03 ENCOUNTER — Ambulatory Visit: Payer: Self-pay | Admitting: *Deleted

## 2022-06-03 DIAGNOSIS — F4310 Post-traumatic stress disorder, unspecified: Secondary | ICD-10-CM

## 2022-06-03 DIAGNOSIS — F411 Generalized anxiety disorder: Secondary | ICD-10-CM

## 2022-06-03 NOTE — Patient Outreach (Signed)
  Care Coordination   Follow Up Visit Note   06/03/2022 Name: Heather Haley MRN: [id number] DOB: 12-Jul-1960  Heather Haley is a 62 y.o. year old female who sees Heather Reichmann, MD for primary care. I spoke with  Heather Haley by phone today.  What matters to the patients health and wellness today?  Patient state she currently has company, unable to talk but will call back.     SDOH assessments and interventions completed:  No     Care Coordination Interventions:  No, not indicated   Follow up plan:  Patient will call back.  If no call back, will have care guide to reschedule.    Encounter Outcome:  Pt. Visit Completed   Kemper Durie, RN, MSN, [facility] Care Management Care Management Coordinator [phone]

## 2022-06-07 DIAGNOSIS — G43719 Chronic migraine without aura, intractable, without status migrainosus: Secondary | ICD-10-CM | POA: Diagnosis not present

## 2022-06-07 DIAGNOSIS — G2119 Other drug induced secondary parkinsonism: Secondary | ICD-10-CM | POA: Diagnosis not present

## 2022-06-07 DIAGNOSIS — R4189 Other symptoms and signs involving cognitive functions and awareness: Secondary | ICD-10-CM | POA: Diagnosis not present

## 2022-06-07 DIAGNOSIS — R531 Weakness: Secondary | ICD-10-CM | POA: Diagnosis not present

## 2022-06-07 DIAGNOSIS — M5481 Occipital neuralgia: Secondary | ICD-10-CM | POA: Diagnosis not present

## 2022-06-09 ENCOUNTER — Telehealth: Payer: Self-pay | Admitting: *Deleted

## 2022-06-09 NOTE — Progress Notes (Signed)
  Care Coordination Note  06/09/2022 Name: Heather Haley MRN: [id number] DOB: Jun 25, 1960  Heather Haley is a 62 y.o. year old female who is a primary care patient of Barbette Reichmann, MD and is actively engaged with the care management team. I reached out to [name] by phone today to assist with re-scheduling a follow up visit with the RN Case Manager  Follow up plan: Unsuccessful telephone outreach attempt made. A HIPAA compliant phone message was left for the patient providing contact information and requesting a return call.   Burman Nieves, CCMA Care Coordination Care Guide Direct Dial: [phone]

## 2022-06-10 NOTE — Progress Notes (Signed)
  Care Coordination Note  06/10/2022 Name: Heather Haley MRN: [id number] DOB: [birth year]  Heather Haley is a 62 y.o. year old female who is a primary care patient of Barbette Reichmann, MD and is actively engaged with the care management team. I reached out to [name] by phone today to assist with re-scheduling a follow up visit with the RN Case Manager  Follow up plan: Telephone appointment with care management team member scheduled for: 06/18/2022  Burman Nieves, [facility] Care Coordination Care Guide Direct Dial: [phone]

## 2022-06-11 ENCOUNTER — Telehealth: Payer: Self-pay | Admitting: *Deleted

## 2022-06-11 NOTE — Progress Notes (Signed)
Pt called back to cx upcoming phone appt with RN on 06/18/2022 - says she is dealing with workmans comp at this time and will discuss with pcp at upcoming appt and contact info given if wants to reschedule

## 2022-06-17 DIAGNOSIS — R600 Localized edema: Secondary | ICD-10-CM | POA: Diagnosis not present

## 2022-06-17 DIAGNOSIS — R0609 Other forms of dyspnea: Secondary | ICD-10-CM | POA: Diagnosis not present

## 2022-06-17 DIAGNOSIS — I25110 Atherosclerotic heart disease of native coronary artery with unstable angina pectoris: Secondary | ICD-10-CM | POA: Diagnosis not present

## 2022-06-18 ENCOUNTER — Encounter: Payer: Self-pay | Admitting: *Deleted

## 2022-06-25 ENCOUNTER — Telehealth (HOSPITAL_COMMUNITY): Payer: Self-pay | Admitting: Emergency Medicine

## 2022-06-25 NOTE — Telephone Encounter (Signed)
Reaching out to patient to offer assistance regarding upcoming cardiac imaging study; pt verbalizes understanding of appt date/time, parking situation and where to check in, pre-test NPO status and medications ordered, and verified current allergies; name and call back number provided for further questions should they arise Heather Alexandria RN Navigator Cardiac Imaging [facility] [phone] office [phone] cell  Take additional dose (50mg  total) 2 hr prior

## 2022-06-28 ENCOUNTER — Ambulatory Visit
Admission: RE | Admit: 2022-06-28 | Discharge: 2022-06-28 | Disposition: A | Discharge status: Home / Self Care | Payer: Medicare HMO | Source: Ambulatory Visit | Attending: Cardiology | Admitting: Cardiology

## 2022-06-28 DIAGNOSIS — I25110 Atherosclerotic heart disease of native coronary artery with unstable angina pectoris: Secondary | ICD-10-CM

## 2022-06-28 DIAGNOSIS — I10 Essential (primary) hypertension: Secondary | ICD-10-CM

## 2022-06-28 DIAGNOSIS — E7800 Pure hypercholesterolemia, unspecified: Secondary | ICD-10-CM

## 2022-06-29 DIAGNOSIS — E1165 Type 2 diabetes mellitus with hyperglycemia: Secondary | ICD-10-CM | POA: Diagnosis not present

## 2022-06-29 DIAGNOSIS — I10 Essential (primary) hypertension: Secondary | ICD-10-CM | POA: Diagnosis not present

## 2022-06-29 DIAGNOSIS — R0789 Other chest pain: Secondary | ICD-10-CM | POA: Diagnosis not present

## 2022-06-29 DIAGNOSIS — R002 Palpitations: Secondary | ICD-10-CM | POA: Diagnosis not present

## 2022-06-29 DIAGNOSIS — E7800 Pure hypercholesterolemia, unspecified: Secondary | ICD-10-CM | POA: Diagnosis not present

## 2022-07-08 ENCOUNTER — Telehealth (HOSPITAL_COMMUNITY): Payer: Self-pay | Admitting: *Deleted

## 2022-07-08 NOTE — Telephone Encounter (Signed)
Reaching out to patient to offer assistance regarding upcoming cardiac imaging study; pt verbalizes understanding of appt date/time, parking situation and where to check in, pre-test NPO status and medications ordered, and verified current allergies; name and call back number provided for further questions should they arise  Larey Brick RN Navigator Cardiac Imaging [facility] [phone] office [phone] cell  Patient to take 50mg  metoprolol tartrate two hours prior to cardiac CT scan. She is aware to arrive at 1:30 pm.

## 2022-07-09 ENCOUNTER — Ambulatory Visit (HOSPITAL_COMMUNITY)
Admission: RE | Admit: 2022-07-09 | Discharge: 2022-07-09 | Disposition: A | Discharge status: Home / Self Care | Payer: Medicare HMO | Source: Ambulatory Visit | Attending: Cardiology | Admitting: Cardiology

## 2022-07-09 DIAGNOSIS — E7800 Pure hypercholesterolemia, unspecified: Secondary | ICD-10-CM | POA: Diagnosis not present

## 2022-07-09 DIAGNOSIS — I700 Atherosclerosis of aorta: Secondary | ICD-10-CM | POA: Diagnosis not present

## 2022-07-09 DIAGNOSIS — I25110 Atherosclerotic heart disease of native coronary artery with unstable angina pectoris: Secondary | ICD-10-CM | POA: Diagnosis not present

## 2022-07-09 DIAGNOSIS — I10 Essential (primary) hypertension: Secondary | ICD-10-CM | POA: Insufficient documentation

## 2022-07-09 LAB — POCT I-STAT CREATININE: Creatinine, Ser: 0.9 mg/dL (ref 0.44–1.00)

## 2022-07-09 MED ORDER — NITROGLYCERIN 0.4 MG SL SUBL
SUBLINGUAL_TABLET | SUBLINGUAL | Status: AC
  Filled 2022-07-09: qty 2

## 2022-07-09 MED ORDER — IOHEXOL 350 MG/ML SOLN
95.0000 mL | Freq: Once | INTRAVENOUS | Status: AC | PRN
  Administered 2022-07-09: 95 mL via INTRAVENOUS

## 2022-07-09 MED ORDER — NITROGLYCERIN 0.4 MG SL SUBL
0.8000 mg | SUBLINGUAL_TABLET | Freq: Once | SUBLINGUAL | Status: AC
  Administered 2022-07-09: 0.8 mg via SUBLINGUAL

## 2022-07-09 NOTE — Progress Notes (Signed)
Pt for CT coronary study. When ordering SL NGT, EPIC flagged it as a contraindication d/t Benztropine Mesylate allergy. Spoke with Pharmacist who said it should be ok to give. Dr. Jens Som made aware and ok to proceed. Pt states that she has had NTG in the past without complication.

## 2022-07-14 DIAGNOSIS — I10 Essential (primary) hypertension: Secondary | ICD-10-CM | POA: Diagnosis not present

## 2022-07-14 DIAGNOSIS — F4310 Post-traumatic stress disorder, unspecified: Secondary | ICD-10-CM | POA: Diagnosis not present

## 2022-07-14 DIAGNOSIS — J4540 Moderate persistent asthma, uncomplicated: Secondary | ICD-10-CM | POA: Diagnosis not present

## 2022-07-14 DIAGNOSIS — K760 Fatty (change of) liver, not elsewhere classified: Secondary | ICD-10-CM | POA: Diagnosis not present

## 2022-07-14 DIAGNOSIS — F209 Schizophrenia, unspecified: Secondary | ICD-10-CM | POA: Diagnosis not present

## 2022-07-14 DIAGNOSIS — I700 Atherosclerosis of aorta: Secondary | ICD-10-CM | POA: Diagnosis not present

## 2022-07-14 DIAGNOSIS — Z6841 Body Mass Index (BMI) 40.0 and over, adult: Secondary | ICD-10-CM | POA: Diagnosis not present

## 2022-07-16 DIAGNOSIS — F4310 Post-traumatic stress disorder, unspecified: Secondary | ICD-10-CM | POA: Diagnosis not present

## 2022-07-16 DIAGNOSIS — K760 Fatty (change of) liver, not elsewhere classified: Secondary | ICD-10-CM | POA: Diagnosis not present

## 2022-07-16 DIAGNOSIS — I700 Atherosclerosis of aorta: Secondary | ICD-10-CM | POA: Diagnosis not present

## 2022-07-16 DIAGNOSIS — F209 Schizophrenia, unspecified: Secondary | ICD-10-CM | POA: Diagnosis not present

## 2022-07-16 DIAGNOSIS — J4540 Moderate persistent asthma, uncomplicated: Secondary | ICD-10-CM | POA: Diagnosis not present

## 2022-07-16 DIAGNOSIS — Z6841 Body Mass Index (BMI) 40.0 and over, adult: Secondary | ICD-10-CM | POA: Diagnosis not present

## 2022-07-16 DIAGNOSIS — I10 Essential (primary) hypertension: Secondary | ICD-10-CM | POA: Diagnosis not present

## 2022-07-16 DIAGNOSIS — R8290 Unspecified abnormal findings in urine: Secondary | ICD-10-CM | POA: Diagnosis not present

## 2022-07-21 DIAGNOSIS — E039 Hypothyroidism, unspecified: Secondary | ICD-10-CM | POA: Diagnosis not present

## 2022-07-21 DIAGNOSIS — E119 Type 2 diabetes mellitus without complications: Secondary | ICD-10-CM | POA: Diagnosis not present

## 2022-07-21 DIAGNOSIS — F209 Schizophrenia, unspecified: Secondary | ICD-10-CM | POA: Diagnosis not present

## 2022-07-21 DIAGNOSIS — I10 Essential (primary) hypertension: Secondary | ICD-10-CM | POA: Diagnosis not present

## 2022-07-21 DIAGNOSIS — E1165 Type 2 diabetes mellitus with hyperglycemia: Secondary | ICD-10-CM | POA: Diagnosis not present

## 2022-07-21 DIAGNOSIS — Z794 Long term (current) use of insulin: Secondary | ICD-10-CM | POA: Diagnosis not present

## 2022-07-21 DIAGNOSIS — G2119 Other drug induced secondary parkinsonism: Secondary | ICD-10-CM | POA: Diagnosis not present

## 2022-07-28 ENCOUNTER — Ambulatory Visit: Payer: Medicare HMO | Admitting: Psychiatry

## 2022-07-28 ENCOUNTER — Other Ambulatory Visit: Payer: Medicare HMO

## 2022-08-11 ENCOUNTER — Other Ambulatory Visit
Admission: RE | Admit: 2022-08-11 | Discharge: 2022-08-11 | Disposition: A | Discharge status: Home / Self Care | Payer: Medicare HMO | Source: Ambulatory Visit | Attending: Nurse Practitioner | Admitting: Nurse Practitioner

## 2022-08-11 DIAGNOSIS — R600 Localized edema: Secondary | ICD-10-CM | POA: Insufficient documentation

## 2022-08-11 DIAGNOSIS — I25118 Atherosclerotic heart disease of native coronary artery with other forms of angina pectoris: Secondary | ICD-10-CM | POA: Diagnosis not present

## 2022-08-11 DIAGNOSIS — I10 Essential (primary) hypertension: Secondary | ICD-10-CM | POA: Diagnosis not present

## 2022-08-11 DIAGNOSIS — R0602 Shortness of breath: Secondary | ICD-10-CM | POA: Insufficient documentation

## 2022-08-11 DIAGNOSIS — R0789 Other chest pain: Secondary | ICD-10-CM | POA: Diagnosis not present

## 2022-08-11 DIAGNOSIS — R079 Chest pain, unspecified: Secondary | ICD-10-CM | POA: Diagnosis not present

## 2022-08-11 DIAGNOSIS — E7800 Pure hypercholesterolemia, unspecified: Secondary | ICD-10-CM | POA: Diagnosis not present

## 2022-08-11 LAB — BRAIN NATRIURETIC PEPTIDE: B Natriuretic Peptide: 29.5 pg/mL (ref 0.0–100.0)

## 2022-08-19 DIAGNOSIS — M7918 Myalgia, other site: Secondary | ICD-10-CM | POA: Diagnosis not present

## 2022-08-19 DIAGNOSIS — M5481 Occipital neuralgia: Secondary | ICD-10-CM | POA: Diagnosis not present

## 2022-08-31 DIAGNOSIS — E1122 Type 2 diabetes mellitus with diabetic chronic kidney disease: Secondary | ICD-10-CM | POA: Diagnosis not present

## 2022-08-31 DIAGNOSIS — Z794 Long term (current) use of insulin: Secondary | ICD-10-CM | POA: Diagnosis not present

## 2022-08-31 DIAGNOSIS — N1831 Chronic kidney disease, stage 3a: Secondary | ICD-10-CM | POA: Diagnosis not present

## 2022-09-07 DIAGNOSIS — E1165 Type 2 diabetes mellitus with hyperglycemia: Secondary | ICD-10-CM | POA: Diagnosis not present

## 2022-09-07 DIAGNOSIS — E782 Mixed hyperlipidemia: Secondary | ICD-10-CM | POA: Diagnosis not present

## 2022-09-07 DIAGNOSIS — N1831 Chronic kidney disease, stage 3a: Secondary | ICD-10-CM | POA: Diagnosis not present

## 2022-09-07 DIAGNOSIS — Z794 Long term (current) use of insulin: Secondary | ICD-10-CM | POA: Diagnosis not present

## 2022-09-07 DIAGNOSIS — Z6841 Body Mass Index (BMI) 40.0 and over, adult: Secondary | ICD-10-CM | POA: Diagnosis not present

## 2022-09-07 DIAGNOSIS — E1122 Type 2 diabetes mellitus with diabetic chronic kidney disease: Secondary | ICD-10-CM | POA: Diagnosis not present

## 2022-09-07 DIAGNOSIS — I10 Essential (primary) hypertension: Secondary | ICD-10-CM | POA: Diagnosis not present

## 2022-09-08 ENCOUNTER — Ambulatory Visit: Payer: Medicare HMO | Admitting: Psychiatry

## 2022-09-08 ENCOUNTER — Encounter: Payer: Self-pay | Admitting: Psychiatry

## 2022-09-08 VITALS — BP 139/87 | HR 93 | Temp 97.8°F | Ht 65.0 in | Wt 243.0 lb

## 2022-09-08 DIAGNOSIS — G4701 Insomnia due to medical condition: Secondary | ICD-10-CM

## 2022-09-08 DIAGNOSIS — F251 Schizoaffective disorder, depressive type: Secondary | ICD-10-CM

## 2022-09-08 DIAGNOSIS — G2119 Other drug induced secondary parkinsonism: Secondary | ICD-10-CM | POA: Diagnosis not present

## 2022-09-08 DIAGNOSIS — F411 Generalized anxiety disorder: Secondary | ICD-10-CM | POA: Diagnosis not present

## 2022-09-08 DIAGNOSIS — F4310 Post-traumatic stress disorder, unspecified: Secondary | ICD-10-CM | POA: Diagnosis not present

## 2022-09-08 DIAGNOSIS — Z79899 Other long term (current) drug therapy: Secondary | ICD-10-CM

## 2022-09-08 MED ORDER — DIVALPROEX SODIUM 250 MG PO DR TAB
250.0000 mg | DELAYED_RELEASE_TABLET | Freq: Three times a day (TID) | ORAL | 1 refills | Status: DC
Start: 2022-09-08 — End: 2022-09-29

## 2022-09-08 NOTE — Progress Notes (Unsigned)
BH MD OP Progress Note  09/08/2022 3:05 PM [name]  MRN:  [id number]  Chief Complaint:  Chief Complaint  Patient presents with   Follow-up   Anxiety   Depression   Hallucinations   Medication Refill   HPI: Heather Haley is a 62 year old Caucasian female, lives in [place], has a history of PTSD, schizoaffective disorder, GAD, drug-induced Parkinson's disease, insomnia, headache, memory problems, diabetes mellitus, chronic pain was evaluated in office today.  Patient today reports she had extensive testing done for multiple physical symptoms including headache, hoarseness of voice and everything came back okay.  Patient however does have a history of cardiac problems and is currently under the care of cardiologist-Dr. Juliann Pares, had recent appointment 08/11/2022.  Patient reports he continues to have anxiety symptoms, feels on edge often, is restless, anxious, fidgety and has irritability.  She is compliant on her medications as prescribed.  She is interested in medication changes or dosage increase.  Patient reports sleep is overall okay, does take the olanzapine.  She is also on Elavil which does help with sleep to some extent.  Patient continues to have chronic hallucinations, auditory, does not bother her much.  He is able to cope.  Patient appeared to be alert, oriented to person place time situation.  3 word memory immediate 3 out of 3, after 5 minutes 3 out of 3.  Patient denies any suicidality, homicidality or perceptual disturbances.  Patient denies any other concerns today.  Visit Diagnosis:    ICD-10-CM   1. Schizoaffective disorder, depressive type (HCC)  F25.1 divalproex (DEPAKOTE) 250 MG DR tablet    Valproic acid level    Hepatic function panel    Sodium    Platelet count    2. PTSD (post-traumatic stress disorder)  F43.10     3. Generalized anxiety disorder  F41.1 divalproex (DEPAKOTE) 250 MG DR tablet    4. Drug-induced Parkinson's disease  (HCC)  G21.19     5. Insomnia due to medical condition  G47.01    anxiety, pain    6. High risk medication use  Z79.899 Valproic acid level    Hepatic function panel    Sodium    Platelet count      Past Psychiatric History: I have reviewed past psychiatric history from progress note on 05/25/2017.  Past Medical History:  Past Medical History:  Diagnosis Date   Anxiety    Asthma    Chronic kidney disease    Chronic pain    previously saw Dr. Shireen Quan in pain clinic, then saw pain specialist in [facility]   Depression    Diabetes mellitus (HCC)    Frequency of urination    GERD (gastroesophageal reflux disease)    Headache(784.0)    High cholesterol    History of kidney stones    Hypertension    IBS (irritable bowel syndrome)    Left ankle instability    Left knee DJD    Lumbar Degenerative Disc Disease of  10/11/2014   Neuromuscular disorder (HCC)    Osteoarthritis of hip (Right) 05/05/2015   Other enthesopathy of ankle and tarsus 12/15/2009   Qualifier: Diagnosis of  By: Darrick Penna MD, KARL     Parkinson's disease    Peripheral sensory neuropathy (Bilateral) 11/19/2014   Postoperative nausea and vomiting    Schizophrenia (HCC)     Past Surgical History:  Procedure Laterality Date   ABDOMINAL HYSTERECTOMY     ANKLE SURGERY Left    x 2  APPENDECTOMY     CARDIAC CATHETERIZATION     COLONOSCOPY  2013   COLONOSCOPY WITH PROPOFOL N/A 04/25/2017   Procedure: COLONOSCOPY WITH PROPOFOL;  Surgeon: Scot Jun, MD;  Location: [facility] ENDOSCOPY;  Service: Endoscopy;  Laterality: N/A;   COLONOSCOPY WITH PROPOFOL N/A 05/25/2022   Procedure: COLONOSCOPY WITH PROPOFOL;  Surgeon: [name], MD;  Location: [facility] ENDOSCOPY;  Service: Endoscopy;  Laterality: N/A;   CYSTOSCOPY/URETEROSCOPY/HOLMIUM LASER/STENT PLACEMENT Left 09/11/2019   Procedure: CYSTOSCOPY/URETEROSCOPY/HOLMIUM LASER/STENT PLACEMENT;  Surgeon: Riki Altes, MD;  Location: [facility] ORS;  Service: Urology;   Laterality: Left;   ESOPHAGOGASTRODUODENOSCOPY (EGD) WITH PROPOFOL N/A 10/03/2014   Procedure: ESOPHAGOGASTRODUODENOSCOPY (EGD) WITH PROPOFOL;  Surgeon: Elnita Maxwell, MD;  Location: [facility] ENDOSCOPY;  Service: Endoscopy;  Laterality: N/A;   ESOPHAGOGASTRODUODENOSCOPY (EGD) WITH PROPOFOL  04/25/2017   Procedure: ESOPHAGOGASTRODUODENOSCOPY (EGD) WITH PROPOFOL;  Surgeon: Scot Jun, MD;  Location: [facility] ENDOSCOPY;  Service: Endoscopy;;   ESOPHAGOGASTRODUODENOSCOPY (EGD) WITH PROPOFOL N/A 05/25/2022   Procedure: ESOPHAGOGASTRODUODENOSCOPY (EGD) WITH PROPOFOL;  Surgeon: [name], MD;  Location: [facility] ENDOSCOPY;  Service: Endoscopy;  Laterality: N/A;   JOINT REPLACEMENT Left    knee   KNEE ARTHROSCOPY  1997   left knee   LEFT HEART CATH AND CORONARY ANGIOGRAPHY Left 11/10/2017   Procedure: LEFT HEART CATH AND CORONARY ANGIOGRAPHY;  Surgeon: Marcina Millard, MD;  Location: [facility] INVASIVE CV LAB;  Service: Cardiovascular;  Laterality: Left;   LEFT HEART CATHETERIZATION WITH CORONARY ANGIOGRAM N/A 01/11/2013   Procedure: LEFT HEART CATHETERIZATION WITH CORONARY ANGIOGRAM;  Surgeon: [name], MD;  Location: [facility] CATH LAB;  Service: Cardiovascular;  Laterality: N/A;   TOTAL KNEE ARTHROPLASTY  08/30/2011   Procedure: TOTAL KNEE ARTHROPLASTY;  Surgeon: Nilda Simmer, MD;  Location: [facility] OR;  Service: Orthopedics;  Laterality: Left;    Family Psychiatric History: Reviewed family psychiatric history from progress note on 05/25/2017.  Family History:  Family History  Problem Relation Age of Onset   Cancer Mother    Hypertension Father    Heart disease Father    Alcohol abuse Father    Breast cancer Neg Hx     Social History: Reviewed social history from progress note on 05/25/2017. Social History   Socioeconomic History   Marital status: Divorced    Spouse name: Not on file   Number of children: 2   Years of education: 11   Highest education level: 11th grade   Occupational History    Employer: [employer]  Tobacco Use   Smoking status: Former    Current packs/day: 0.00    Average packs/day: 2.0 packs/day for 15.0 years (30.0 ttl pk-yrs)    Types: Cigarettes    Start date: 02/09/1980    Quit date: 02/09/1995    Years since quitting: 27.6   Smokeless tobacco: Never  Vaping Use   Vaping status: Never Used  Substance and Sexual Activity   Alcohol use: No    Alcohol/week: 0.0 standard drinks of alcohol   Drug use: No   Sexual activity: Not Currently  Other Topics Concern   Not on file  Social History Narrative   Patient lives at home alone. Patient works at [employer].   Caffeine daily- 2   Right handed.   Education- 11 th grade   Social Determinants of Health   Financial Resource Strain: Low Risk  (03/23/2022)   Received from [facility], [facility]   Overall Financial Resource Strain (CARDIA)  Difficulty of Paying Living Expenses: Not hard at all  Food Insecurity: No Food Insecurity (03/23/2022)   Received from [facility], [facility]   Hunger Vital Sign    Worried About Running Out of Food in the Last Year: Never true    Ran Out of Food in the Last Year: Never true  Transportation Needs: No Transportation Needs (03/23/2022)   [facility], [facility]   [facility]    In the past 12 months, has lack of transportation kept you from medical appointments or from getting medications?: No    Lack of Transportation (Non-Medical): No  Physical Activity: Inactive (03/24/2017)   Exercise Vital Sign    Days of Exercise per Week: 0 days    Minutes of Exercise per Session: 0 min  Stress: Stress Concern Present (03/24/2017)   [employer] of Occupational Health - Occupational Stress Questionnaire    Feeling of Stress : Rather much  Social Connections: Unknown (01/12/2022)   Received from [facility]   Social Network    Social Network: Not on file    Allergies:  Allergies  Allergen Reactions   Prolixin Decanoate [Fluphenazine]     Ineffective    Benztropine Other (See Comments)    Hair fall out  Suicide thoughts   Benztropine Mesylate Other (See Comments)   Buprenorphine Hcl Other (See Comments)    Unable to void   Carbidopa-Levodopa Nausea Only   Codeine Hives   Cymbalta [Duloxetine Hcl] Other (See Comments)    Altered mental status, Alopecia, visual hallucinations, nightmares   Gabapentin Swelling   Lyrica [Pregabalin] Other (See Comments)    Alopecia, visual hallucinations, nightmares Altered mental status   Morphine And Codeine Other (See Comments)    Unable to void   Nortripytline Hcl [Nortriptyline] Other (See Comments)    Hair loss and night mares    Nsaids     REACTION: palpitations, diaphoresis   Other Other (See Comments)   Penicillins Nausea And Vomiting and Other (See Comments)    REACTION: upset stomach Has patient had a PCN reaction causing immediate rash, facial/tongue/throat swelling, SOB or lightheadedness with hypotension: No Has patient had a PCN reaction causing severe rash involving mucus membranes or skin necrosis: No Has patient had a PCN reaction that required hospitalization: No Has patient had a PCN reaction occurring within the last 10 years: No If all of the above answers are "NO", then may proceed with Cephalosporin use.   Risperidone And Related Other (See Comments)    tremors   Wellbutrin [Bupropion]     Constipation, mood swings   Tolmetin Other (See Comments) and Palpitations    REACTION: palpitations, diaphoresis    Metabolic Disorder Labs: Lab Results  Component Value Date   HGBA1C 6.2 (H) 03/06/2017   MPG 131.24 03/06/2017   MPG 108 05/17/2015   Lab Results  Component Value Date   PROLACTIN 4.5 (L) 06/13/2020   PROLACTIN 64.2 (H) 03/06/2017   Lab Results  Component Value Date   CHOL 146 08/22/2018   TRIG 172  (H) 08/22/2018   HDL 49 08/22/2018   CHOLHDL 3.0 08/22/2018   VLDL 34 08/22/2018   LDLCALC 63 08/22/2018   LDLCALC 59 03/06/2017   Lab Results  Component Value Date   TSH 0.959 08/22/2018   TSH 2.766 08/24/2017    Therapeutic Level Labs: No results found for: "LITHIUM" No results found for: "VALPROATE" No results found for: "CBMZ"  Current Medications: Current Outpatient Medications  Medication Sig Dispense Refill   ACCU-CHEK GUIDE test strip      acetaminophen (TYLENOL) 500 MG tablet Take 1,000 mg by mouth every 6 (six) hours as needed for moderate pain or headache.      albuterol (PROVENTIL HFA;VENTOLIN HFA) 108 (90 Base) MCG/ACT inhaler Inhale 2 puffs into the lungs every 6 (six) hours as needed for wheezing or shortness of breath.     amitriptyline (ELAVIL) 50 MG tablet TAKE 1 AND 1/2 TABLETS AT BEDTIME 135 tablet 1   aspirin EC 81 MG tablet Take 81 mg by mouth daily. Swallow whole.     buprenorphine (BUTRANS) 20 MCG/HR PTWK Place onto the skin.     carbidopa-levodopa (SINEMET CR) 50-200 MG tablet Take 1 tablet by mouth at bedtime.     carbidopa-levodopa (SINEMET IR) 25-100 MG tablet Take 1 tablet by mouth 3 (three) times daily.     Cholecalciferol (VITAMIN D-3) 125 MCG (5000 UT) TABS Take 5,000 Units by mouth daily.     divalproex (DEPAKOTE) 250 MG DR tablet Take 1 tablet (250 mg total) by mouth 3 (three) times daily. 90 tablet 1   furosemide (LASIX) 40 MG tablet Take 40 mg by mouth as directed. Takes every other day     insulin aspart (FIASP FLEXTOUCH) 100 UNIT/ML FlexTouch Pen Inject 10 Units into the skin daily in the afternoon. Takes 10 units at 10:00 am, 16 units at 4 pm, 10 units at 10 pm.     insulin degludec (TRESIBA FLEXTOUCH) 100 UNIT/ML FlexTouch Pen Inject 38 Units into the skin daily. At 10:00 pm     metFORMIN (GLUCOPHAGE) 500 MG tablet Take by mouth.     metoprolol tartrate (LOPRESSOR) 25 MG tablet Take 25 mg by mouth 2 (two) times daily.     nitroGLYCERIN  (NITRODUR - DOSED IN MG/24 HR) 0.4 mg/hr patch Place 0.4 mg onto the skin as needed.     OLANZapine (ZYPREXA) 5 MG tablet Take 0.5 tablets (2.5 mg total) by mouth at bedtime. 90 tablet 3   Omega-3 Fatty Acids (FISH OIL) 1000 MG CAPS Take by mouth.     omeprazole (PRILOSEC) 40 MG capsule      ondansetron (ZOFRAN) 4 MG tablet Take 4 mg by mouth as needed for nausea or vomiting.     oxyCODONE (OXY IR/ROXICODONE) 5 MG immediate release tablet Take 5-10 mg by mouth daily as needed.     pravastatin (PRAVACHOL) 40 MG tablet Take by mouth.     rizatriptan (MAXALT) 10 MG tablet Take 10 mg by mouth 2 (two) times daily as needed for migraine. May repeat in 2 hours if needed     rOPINIRole (REQUIP) 0.5 MG tablet Take 0.5 mg by mouth 3 (three) times daily.     torsemide (DEMADEX) 20 MG tablet Take 20 mg by mouth daily.     Alcohol Swabs (B-D SINGLE USE SWABS REGULAR) PADS      B-D ULTRAFINE III SHORT PEN 31G X 8 MM MISC Inject into the skin.     Blood Glucose Calibration (ACCU-CHEK AVIVA) SOLN      Blood Glucose Monitoring Suppl (ACCU-CHEK AVIVA PLUS) w/Device KIT      butalbital-acetaminophen-caffeine (FIORICET) 50-325-40 MG tablet      levothyroxine (SYNTHROID) 50 MCG tablet Take by mouth.     No current facility-administered medications for this visit.     Musculoskeletal: Strength & Muscle Tone: within normal limits Gait & Station:  walks with cane Patient  leans: N/A  Psychiatric Specialty Exam: Review of Systems  Psychiatric/Behavioral:  Positive for hallucinations. The patient is nervous/anxious.        Mood swings    Blood pressure 139/87, pulse 93, temperature 97.8 F (36.6 C), height 5\' 5"  (1.651 m), weight 243 lb (110.2 kg), SpO2 95%.Body mass index is 40.44 kg/m.  General Appearance: Fairly Groomed  Eye Contact:  Fair  Speech:  Clear and Coherent  Volume:  Normal  Mood:  Anxious and mood swings  Affect:  Congruent  Thought Process:  Goal Directed and Descriptions of  Associations: Intact  Orientation:  Full (Time, Place, and Person)  Thought Content: Hallucinations: Auditory coping with it  Suicidal Thoughts:  No  Homicidal Thoughts:  No  Memory:  Immediate;   Fair Recent;   Fair Remote;   Fair  Judgement:  Fair  Insight:  Fair  Psychomotor Activity:  Tremor  Concentration:  Concentration: Fair and Attention Span: Fair  Recall:  [employer] of Knowledge: Fair  Language: Fair  Akathisia:  No  Handed:  Right  AIMS (if indicated): done  Assets:  Desire for Improvement Housing Social Support Transportation  ADL's:  Intact  Cognition: WNL  Sleep:  Fair   Screenings: Midwife Visit from 09/08/2022 in [facility] Office Visit from 04/27/2022 in [facility] Office Visit from 03/16/2022 in [facility] Office Visit from 12/14/2021 in [facility] Office Visit from 08/28/2021 in [facility]  AIMS Total Score 5 0 0 0 0      AUDIT    Flowsheet Row Admission (Discharged) from OP Visit from 08/30/2018 [facility] Ad[facility] (Discharged) from [facility]  Alcohol Use Disorder Identification Test Final Score (AUDIT) 0 0 0      GAD-7    Flowsheet Row Office Visit from 09/08/2022 in [facility] Office Visit from 04/27/2022 in [facility] Office Visit from 03/16/2022 in [facility] Office Visit from 12/14/2021 in [facility] Office Visit from 08/28/2021 in [facility]  Total GAD-7 Score 5 5 5  8 5       PHQ2-9    Flowsheet Row Office Visit from 09/08/2022 in [facility] Office Visit from 04/27/2022 in [facility] Office Visit from 03/16/2022 in [facility] Office Visit from 12/14/2021 in [facility] Office Visit from 08/28/2021 in [facility]  PHQ-2 Total Score 2 0 0 1 0  PHQ-9 Total Score 6 8 6  -- --      Flowsheet Row Office Visit from 09/08/2022 in [facility] Admission (Discharged) from 05/25/2022 in [facility] Office Visit from 04/27/2022 in [facility]  C-SSRS RISK CATEGORY No Risk No Risk No Risk        Assessment and Plan: MIREYA WINT is a 62 year old Caucasian female who has a history of schizoaffective disorder, PTSD, migraine headaches, multiple medical problems was evaluated in office today.  Patient with anxiety, irritability, will benefit from medication management, addition of a mood stabilizer  like Depakote, will benefit from the following plan.  Plan Schizoaffective disorder-unstable Start Depakote 250 mg p.o. 3 times daily Continue Zyprexa at reduced dosage of 2.5 mg p.o. nightly  GAD-unstable Start Depakote as noted above. Elavil 75 mg p.o. nightly  PTSD-stable Elavil 75 mg p.o. nightly  Insomnia-stable Continue Elavil and Zyprexa which helps with sleep.  Drug-induced Parkinson's disease-stable Continue carbidopa/levodopa-currently under the care of neurologist.  High risk medication use-will order Depakote level, LFT, platelet count, sodium level-patient to go to the lab after a week of starting Depakote early in the morning before the morning dose of Depakote.  Lab order sent to [facility].  Follow-up in clinic in 7 weeks or sooner if  needed.    Consent: Patient/Guardian gives verbal consent for treatment and assignment of benefits for services provided during this visit. Patient/Guardian expressed understanding and agreed to proceed.   This note was generated in part or whole with voice recognition software. Voice recognition is usually quite accurate but there are transcription errors that can and very often do occur. I apologize for any typographical errors that were not detected and corrected.  I have spent atleast 40  minutes face to face with patient today which includes the time spent for preparing to see the patient ( e.g., review of test, records ), ordering medications and test including initiation of medication like Depakote which can get toxic in the system and hence levels needs to be monitored along with LFTs and other labs,psychoeducation and supportive psychotherapy and care coordination,as well as documenting clinical information in electronic health record,interpreting and communication of test results   Jomarie Longs, MD 09/09/2022, 1:26 PM

## 2022-09-08 NOTE — Patient Instructions (Signed)
Divalproex Sodium Sprinkle Capsules What is this medication? DIVALPROEX SODIUM (dye VAL pro ex SO dee um) prevents and controls seizures in people with epilepsy. It works by calming overactive nerves in your body. This medicine may be used for other purposes; ask your health care provider or pharmacist if you have questions. COMMON BRAND NAME(S): Depakote What should I tell my care team before I take this medication? They need to know if you have any of these conditions: Frequently drink alcohol Kidney disease Liver disease Low platelet counts Mitochondrial disease Suicidal thoughts, plans, or attempt by you or a family member Urea cycle disorder (UCD) An unusual or allergic reaction to divalproex sodium, sodium valproate, valproic acid, other medications, foods, dyes, or preservatives Pregnant or trying to get pregnant Breast-feeding How should I use this medication? Take this medication by mouth. It can be swallowed whole or the capsules may be opened carefully and the contents sprinkled on about a teaspoonful of applesauce or pudding. This mixture must be swallowed immediately. Do not cut, crush or chew this medication. You can take it with or without food. If it upsets your stomach, take it with food. Follow the directions on the prescription label. Take your medication at regular intervals. Do not take it more often than directed. Do not stop taking except on your care team's advice. A special MedGuide will be given to you by the pharmacist with each prescription and refill. Be sure to read this information carefully each time. Talk to your care team about the use of this medication in children. While this medication may be prescribed for children as young as 10 years for selected conditions, precautions do apply. Overdosage: If you think you have taken too much of this medicine contact a poison control center or emergency room at once. NOTE: This medicine is only for you. Do not share  this medicine with others. What if I miss a dose? If you miss a dose, take it as soon as you can. If it is almost time for your next dose, take only that dose. Do not take double or extra doses. What may interact with this medication? Do not take this medication with any of the following: Sodium phenylbutyrate This medication may also interact with the following: Aspirin Certain antibiotics, such as ertapenem, imipenem, meropenem Certain medications for depression, anxiety, or other mental health conditions Certain medications for seizures, such as cannabidiol, carbamazepine, clonazepam, diazepam, ethosuximide, felbamate, lamotrigine, phenobarbital, phenytoin, primidone, rufinamide, topiramate Certain medications that treat or prevent blood clots, such as warfarin Cholestyramine Estrogen and progestin hormones Methotrexate Propofol Rifampin Ritonavir Tolbutamide Zidovudine This list may not describe all possible interactions. Give your health care provider a list of all the medicines, herbs, non-prescription drugs, or dietary supplements you use. Also tell them if you smoke, drink alcohol, or use illegal drugs. Some items may interact with your medicine. What should I watch for while using this medication? Tell your care team if your symptoms do not get better or they start to get worse. This medication may cause serious skin reactions. They can happen weeks to months after starting the medication. Contact your care team right away if you notice fevers or flu-like symptoms with a rash. The rash may be red or purple and then turn into blisters or peeling of the skin. Or, you might notice a red rash with swelling of the face, lips or lymph nodes in your neck or under your arms. Wear a medical ID bracelet or chain, and carry a  card that describes your disease and details of your medication and dosage times. You may get drowsy, dizzy, or have blurred vision. Do not drive, use machinery, or do  anything that needs mental alertness until you know how this medication affects you. To reduce dizzy or fainting spells, do not sit or stand up quickly, especially if you are an older patient. Alcohol can increase drowsiness and dizziness. Avoid alcoholic drinks. This medication can make you more sensitive to the sun. Keep out of the sun. If you cannot avoid being in the sun, wear protective clothing and use sunscreen. Do not use sun lamps or tanning beds/booths. Patients and their families should watch out for new or worsening depression or thoughts of suicide. Also watch out for sudden changes in feelings such as feeling anxious, agitated, panicky, irritable, hostile, aggressive, impulsive, severely restless, overly excited and hyperactive, or not being able to sleep. If this happens, especially at the beginning of treatment or after a change in dose, call your care team. Women should inform their care team if they wish to become pregnant or think they might be pregnant. There is a potential for serious side effects to an unborn child. Talk to your care team or pharmacist for more information. Women who become pregnant while using this medication may enroll in the [place] [employer] Antiepileptic Drug Pregnancy Registry by calling [id number]. This registry collects information about the safety of antiepileptic medication use during pregnancy. This medication may cause a decrease in folic acid and vitamin D. You should make sure that you get enough vitamins while you are taking this medication. Discuss the foods you eat and the vitamins you take with your care team. What side effects may I notice from receiving this medication? Side effects that you should report to your care team as soon as possible: Allergic reactions--skin rash, itching, hives, swelling of the face, lips, tongue, or throat High ammonia level--unusual weakness or fatigue, confusion, loss of appetite, nausea, vomiting, seizures Liver  injury--right upper belly pain, loss of appetite, nausea, light-colored stool, dark yellow or brown urine, yellowing skin or eyes, unusual weakness or fatigue Low body temperature, drowsiness, confusion Pancreatitis--severe stomach pain that spreads to your back or gets worse after eating or when touched, fever, nausea, vomiting Rash, fever, and swollen lymph nodes Thoughts of suicide or self-harm, worsening mood, feelings of depression Unusual bruising or bleeding Side effects that usually do not require medical attention (report to your care team if they continue or are bothersome): Change in vision Dizziness Drowsiness Hair loss Headache Nausea Tremors or shaking Weight gain This list may not describe all possible side effects. Call your doctor for medical advice about side effects. You may report side effects to FDA at 1-800-FDA-1088. Where should I keep my medication? Keep out of reach of children and pets. Store at room temperature below 25 degrees C (77 degrees F). Keep container tightly closed. Throw away any unused medication after the expiration date. NOTE: This sheet is a summary. It may not cover all possible information. If you have questions about this medicine, talk to your doctor, pharmacist, or health care provider.  2024 [employer] Standard (2021-05-06 00:00:00)

## 2022-09-24 ENCOUNTER — Telehealth: Payer: Self-pay

## 2022-09-24 NOTE — Telephone Encounter (Signed)
Patient advised to stop the Depakote if she is having severe symptoms of side effects.  Otherwise advised to get the levels done-Depakote level and other labs ordered.  Patient agrees to monitor her symptoms and agrees to get help if her symptoms gets worse.  She also agrees to go to the lab if she decides to stay on the Depakote.

## 2022-09-24 NOTE — Telephone Encounter (Signed)
pt called states she is not going to take the divalproex. she is shaking and feeling on edge

## 2022-09-25 ENCOUNTER — Other Ambulatory Visit
Admission: RE | Admit: 2022-09-25 | Discharge: 2022-09-25 | Disposition: A | Discharge status: Home / Self Care | Payer: Medicare HMO | Attending: Psychiatry | Admitting: Psychiatry

## 2022-09-25 DIAGNOSIS — F251 Schizoaffective disorder, depressive type: Secondary | ICD-10-CM | POA: Diagnosis not present

## 2022-09-25 DIAGNOSIS — Z79899 Other long term (current) drug therapy: Secondary | ICD-10-CM

## 2022-09-25 LAB — VALPROIC ACID LEVEL: Valproic Acid Lvl: 43 ug/mL — ABNORMAL LOW (ref 50.0–100.0)

## 2022-09-25 LAB — HEPATIC FUNCTION PANEL
ALT: 14 U/L (ref 0–44)
AST: 36 U/L (ref 15–41)
Albumin: 3.8 g/dL (ref 3.5–5.0)
Alkaline Phosphatase: 74 U/L (ref 38–126)
Bilirubin, Direct: 0.1 mg/dL (ref 0.0–0.2)
Indirect Bilirubin: 0.7 mg/dL (ref 0.3–0.9)
Total Bilirubin: 0.8 mg/dL (ref 0.3–1.2)
Total Protein: 7.8 g/dL (ref 6.5–8.1)

## 2022-09-25 LAB — SODIUM: Sodium: 138 mmol/L (ref 135–145)

## 2022-09-25 LAB — PLATELET COUNT: Platelets: 137 10*3/uL — ABNORMAL LOW (ref 150–400)

## 2022-09-29 ENCOUNTER — Telehealth: Payer: Self-pay | Admitting: Psychiatry

## 2022-09-29 DIAGNOSIS — F251 Schizoaffective disorder, depressive type: Secondary | ICD-10-CM

## 2022-09-29 DIAGNOSIS — F411 Generalized anxiety disorder: Secondary | ICD-10-CM

## 2022-09-29 MED ORDER — DIVALPROEX SODIUM 250 MG PO DR TAB
250.0000 mg | DELAYED_RELEASE_TABLET | Freq: Three times a day (TID) | ORAL | Status: DC
Start: 2022-09-29 — End: 2022-10-27

## 2022-09-29 NOTE — Telephone Encounter (Signed)
Contacted patient to discuss her labs including platelet count which is trending low-137-09/25/2022.  Patient today reports she has had some bleeding in her urine since the past 1 month.  Unknown if the Depakote is contributing to the low platelet count.  The Depakote was just started on 09/08/2022.  Patient agrees to reach out to primary care provider for evaluation.  Discussed with patient to stop the Depakote if there are no other underlying reasons for her platelet count trending low after discussion with her primary care provider.  Patient agrees with the same.

## 2022-09-29 NOTE — Telephone Encounter (Signed)
copy of labwork was faxed and confirmed to the pcp about abnormal labwork.

## 2022-10-01 DIAGNOSIS — Z794 Long term (current) use of insulin: Secondary | ICD-10-CM | POA: Diagnosis not present

## 2022-10-01 DIAGNOSIS — D696 Thrombocytopenia, unspecified: Secondary | ICD-10-CM | POA: Diagnosis not present

## 2022-10-01 DIAGNOSIS — F209 Schizophrenia, unspecified: Secondary | ICD-10-CM | POA: Diagnosis not present

## 2022-10-01 DIAGNOSIS — F4310 Post-traumatic stress disorder, unspecified: Secondary | ICD-10-CM | POA: Diagnosis not present

## 2022-10-01 DIAGNOSIS — Z6841 Body Mass Index (BMI) 40.0 and over, adult: Secondary | ICD-10-CM | POA: Diagnosis not present

## 2022-10-01 DIAGNOSIS — M79662 Pain in left lower leg: Secondary | ICD-10-CM | POA: Diagnosis not present

## 2022-10-01 DIAGNOSIS — M1712 Unilateral primary osteoarthritis, left knee: Secondary | ICD-10-CM | POA: Diagnosis not present

## 2022-10-01 DIAGNOSIS — E1165 Type 2 diabetes mellitus with hyperglycemia: Secondary | ICD-10-CM | POA: Diagnosis not present

## 2022-10-01 DIAGNOSIS — G2119 Other drug induced secondary parkinsonism: Secondary | ICD-10-CM | POA: Diagnosis not present

## 2022-10-01 DIAGNOSIS — I10 Essential (primary) hypertension: Secondary | ICD-10-CM | POA: Diagnosis not present

## 2022-10-04 ENCOUNTER — Other Ambulatory Visit: Payer: Self-pay | Admitting: Internal Medicine

## 2022-10-04 DIAGNOSIS — D696 Thrombocytopenia, unspecified: Secondary | ICD-10-CM

## 2022-10-06 ENCOUNTER — Ambulatory Visit
Admission: RE | Admit: 2022-10-06 | Discharge: 2022-10-06 | Disposition: A | Discharge status: Home / Self Care | Payer: Medicare HMO | Source: Ambulatory Visit | Attending: Internal Medicine | Admitting: Internal Medicine

## 2022-10-06 DIAGNOSIS — R161 Splenomegaly, not elsewhere classified: Secondary | ICD-10-CM | POA: Diagnosis not present

## 2022-10-06 DIAGNOSIS — D696 Thrombocytopenia, unspecified: Secondary | ICD-10-CM | POA: Insufficient documentation

## 2022-10-27 ENCOUNTER — Encounter: Payer: Self-pay | Admitting: Psychiatry

## 2022-10-27 ENCOUNTER — Telehealth (INDEPENDENT_AMBULATORY_CARE_PROVIDER_SITE_OTHER): Payer: Medicare HMO | Admitting: Psychiatry

## 2022-10-27 DIAGNOSIS — G2119 Other drug induced secondary parkinsonism: Secondary | ICD-10-CM

## 2022-10-27 DIAGNOSIS — G4701 Insomnia due to medical condition: Secondary | ICD-10-CM | POA: Diagnosis not present

## 2022-10-27 DIAGNOSIS — F411 Generalized anxiety disorder: Secondary | ICD-10-CM | POA: Diagnosis not present

## 2022-10-27 DIAGNOSIS — F4310 Post-traumatic stress disorder, unspecified: Secondary | ICD-10-CM

## 2022-10-27 DIAGNOSIS — F251 Schizoaffective disorder, depressive type: Secondary | ICD-10-CM

## 2022-10-27 NOTE — Progress Notes (Signed)
Virtual Visit via Telephone Note  I connected with Heather Haley on 10/27/22 at  3:00 PM EDT by telephone and verified that I am speaking with the correct person using two identifiers.  Location Provider Location : [facility] Patient Location : Home  Participants: Patient , Provider   I discussed the limitations, risks, security and privacy concerns of performing an evaluation and management service by telephone and the availability of in person appointments. I also discussed with the patient that there may be a patient responsible charge related to this service. The patient expressed understanding and agreed to proceed.    I discussed the assessment and treatment plan with the patient. The patient was provided an opportunity to ask questions and all were answered. The patient agreed with the plan and demonstrated an understanding of the instructions.   The patient was advised to call back or seek an in-person evaluation if the symptoms worsen or if the condition fails to improve as anticipated.    BH MD OP Progress Note  10/27/2022 3:23 PM Heather Haley  MRN:  [id number]  Chief Complaint:  Chief Complaint  Patient presents with   Follow-up   Depression   Hallucinations   Anxiety   Medication Refill   HPI: Heather Haley is a 62 year old Caucasian female lives in [place], has a history of PTSD, schizoaffective disorder, GAD, drug-induced Parkinson's disease, insomnia, headache, memory problems, diabetes mellitus, chronic pain was evaluated by phone today.  Patient was scheduled to be seen in the office however contacted this morning stating that she was likely coming down with flulike symptoms and wanted her appointment changed.  Patient today appeared to be alert, oriented to person place time and situation.  Reports she has been feeling tired and has a sore throat.  She agrees to reach out to her primary care provider if her symptoms does not get better.  Patient  reports since she developed side effects to Depakote she is no longer on it.  Her provider Dr. Gerome Apley is currently following her and monitoring her for her recent thrombocytopenia.  She has upcoming appointment.  Patient reports overall she is currently doing fairly well on the olanzapine and amitriptyline.  She reports sleep was fair.  She does have hallucinations however she has been coping okay.  It does not bother her much.  Patient denies any significant depression or anxiety symptoms.  Patient reports she is currently trying to lose weight and has been cutting back on sugar.  That has helped her to lose a few pounds.  Patient denies any suicidality or homicidality.  Patient with 3 word memory immediate 3 out of 3, after 5 minutes 2 out of 3.  Attention and concentration seem to be fair-patient was able to do digits forward and backward.  Patient denies any other concerns today.  Visit Diagnosis:    ICD-10-CM   1. Schizoaffective disorder, depressive type (HCC)  F25.1     2. PTSD (post-traumatic stress disorder)  F43.10     3. Generalized anxiety disorder  F41.1     4. Drug-induced Parkinson's disease (HCC)  G21.19     5. Insomnia due to medical condition  G47.01    anxiety, pain      Past Psychiatric History: I have reviewed past psychiatric history from progress note on 05/25/2017.  Past Medical History:  Past Medical History:  Diagnosis Date   Anxiety    Asthma    Chronic kidney disease    Chronic pain  previously saw Dr. Shireen Quan in pain clinic, then saw pain specialist in [facility]   Depression    Diabetes mellitus (HCC)    Frequency of urination    GERD (gastroesophageal reflux disease)    Headache(784.0)    High cholesterol    History of kidney stones    Hypertension    IBS (irritable bowel syndrome)    Left ankle instability    Left knee DJD    Lumbar Degenerative Disc Disease of  10/11/2014   Neuromuscular disorder (HCC)    Osteoarthritis of hip  (Right) 05/05/2015   Other enthesopathy of ankle and tarsus 12/15/2009   Qualifier: Diagnosis of  By: Darrick Penna MD, Heather Haley     Parkinson's disease    Peripheral sensory neuropathy (Bilateral) 11/19/2014   Postoperative nausea and vomiting    Schizophrenia (HCC)     Past Surgical History:  Procedure Laterality Date   ABDOMINAL HYSTERECTOMY     ANKLE SURGERY Left    x 2   APPENDECTOMY     CARDIAC CATHETERIZATION     COLONOSCOPY  2013   COLONOSCOPY WITH PROPOFOL N/A 04/25/2017   Procedure: COLONOSCOPY WITH PROPOFOL;  Surgeon: Scot Jun, MD;  Location[facility] ENDOSCOPY;  Service: Endoscopy;  Laterality: N/A;   COLONOSCOPY WITH PROPOFOL N/A 05/25/2022   Procedure: COLONOSCOPY WITH PROPOFOL;  Surgeon: [name], MD;  Location: [facility] ENDOSCOPY;  Service: Endoscopy;  Laterality: N/A;   CYSTOSCOPY/URETEROSCOPY/HOLMIUM LASER/STENT PLACEMENT Left 09/11/2019   Procedure: CYSTOSCOPY/URETEROSCOPY/HOLMIUM LASER/STENT PLACEMENT;  Surgeon: Riki Altes, MD;  Location: [facility] ORS;  Service: Urology;  Laterality: Left;   ESOPHAGOGASTRODUODENOSCOPY (EGD) WITH PROPOFOL N/A 10/03/2014   Procedure: ESOPHAGOGASTRODUODENOSCOPY (EGD) WITH PROPOFOL;  Surgeon: Elnita Maxwell, MD;  Location: [facility] ENDOSCOPY;  Service: Endoscopy;  Laterality: N/A;   ESOPHAGOGASTRODUODENOSCOPY (EGD) WITH PROPOFOL  04/25/2017   Procedure: ESOPHAGOGASTRODUODENOSCOPY (EGD) WITH PROPOFOL;  Surgeon: Scot Jun, MD;  Location: [facility] ENDOSCOPY;  Service: Endoscopy;;   ESOPHAGOGASTRODUODENOSCOPY (EGD) WITH PROPOFOL N/A 05/25/2022   Procedure: ESOPHAGOGASTRODUODENOSCOPY (EGD) WITH PROPOFOL;  Surgeon: [name], MD;  Location: [facility] ENDOSCOPY;  Service: Endoscopy;  Laterality: N/A;   JOINT REPLACEMENT Left    knee   KNEE ARTHROSCOPY  1997   left knee   LEFT HEART CATH AND CORONARY ANGIOGRAPHY Left 11/10/2017   Procedure: LEFT HEART CATH AND CORONARY ANGIOGRAPHY;  Surgeon: Marcina Millard, MD;  Location: [facility]  INVASIVE CV LAB;  Service: Cardiovascular;  Laterality: Left;   LEFT HEART CATHETERIZATION WITH CORONARY ANGIOGRAM N/A 01/11/2013   Procedure: LEFT HEART CATHETERIZATION WITH CORONARY ANGIOGRAM;  Surgeon: [name], MD;  Location: [facility] CATH LAB;  Service: Cardiovascular;  Laterality: N/A;   TOTAL KNEE ARTHROPLASTY  08/30/2011   Procedure: TOTAL KNEE ARTHROPLASTY;  Surgeon: Nilda Simmer, MD;  Location: [facility] OR;  Service: Orthopedics;  Laterality: Left;    Family Psychiatric History: I have reviewed family psychiatric history from progress note on 05/25/2017.  Family History:  Family History  Problem Relation Age of Onset   Cancer Mother    Hypertension Father    Heart disease Father    Alcohol abuse Father    Breast cancer Neg Hx     Social History: I have reviewed social history from progress note on 05/25/2017. Social History   Socioeconomic History   Marital status: Divorced    Spouse name: Not on file   Number of children: 2   Years of education: 11   Highest education level: 11th grade  Occupational History  Employer: [employer]  Tobacco Use   Smoking status: Former    Current packs/day: 0.00    Average packs/day: 2.0 packs/day for 15.0 years (30.0 ttl pk-yrs)    Types: Cigarettes    Start date: 02/09/1980    Quit date: 02/09/1995    Years since quitting: 27.7   Smokeless tobacco: Never  Vaping Use   Vaping status: Never Used  Substance and Sexual Activity   Alcohol use: No    Alcohol/week: 0.0 standard drinks of alcohol   Drug use: No   Sexual activity: Not Currently  Other Topics Concern   Not on file  Social History Narrative   Patient lives at home alone. Patient works at [employer].   Caffeine daily- 2   Right handed.   Education- 11 th grade   Social Determinants of Health   Financial Resource Strain: Low Risk  (10/01/2022)   Received from [facility]   Overall Financial Resource Strain (CARDIA)    Difficulty of Paying  Living Expenses: Not hard at all  Food Insecurity: No Food Insecurity (10/01/2022)   [facility] from [facility]   Hunger Vital Sign    Worried About Running Out of Food in the Last Year: Never true    Ran Out of Food in the Last Year: Never true  Transportation Needs: No Transportation Needs (10/01/2022)   Received [facility] - [facility]    In the past 12 months, has lack of transportation kept you from medical appointments or from getting medications?: No    Lack of Transportation (Non-Medical): No  Physical Activity: Inactive (03/24/2017)   Exercise Vital Sign    Days of Exercise per Week: 0 days    Minutes of Exercise per Session: 0 min  Stress: Stress Concern Present (03/24/2017)   [employer] of Occupational Health - Occupational Stress Questionnaire    Feeling of Stress : Rather much  Social Connections: Unknown (01/12/2022)   [facility]   Social Network    Social Network: Not on file    Allergies:  Allergies  Allergen Reactions   Prolixin Decanoate [Fluphenazine]     Ineffective    Benztropine Other (See Comments)    Hair fall out  Suicide thoughts   Benztropine Mesylate Other (See Comments)   Buprenorphine Hcl Other (See Comments)    Unable to void   Carbidopa-Levodopa Nausea Only   Codeine Hives   Cymbalta [Duloxetine Hcl] Other (See Comments)    Altered mental status, Alopecia, visual hallucinations, nightmares   Gabapentin Swelling   Lyrica [Pregabalin] Other (See Comments)    Alopecia, visual hallucinations, nightmares Altered mental status   Morphine And Codeine Other (See Comments)    Unable to void   Nortripytline Hcl [Nortriptyline] Other (See Comments)    Hair loss and night mares    Nsaids     REACTION: palpitations, diaphoresis   Other Other (See Comments)   Penicillins Nausea And Vomiting and Other (See Comments)    REACTION: upset stomach Has patient had a  PCN reaction causing immediate rash, facial/tongue/throat swelling, SOB or lightheadedness with hypotension: No Has patient had a PCN reaction causing severe rash involving mucus membranes or skin necrosis: No Has patient had a PCN reaction that required hospitalization: No Has patient had a PCN reaction occurring within the last 10 years: No If all of the above answers are "NO", then may proceed with Cephalosporin use.   Risperidone  And Related Other (See Comments)    tremors   Wellbutrin [Bupropion]     Constipation, mood swings   Tolmetin Other (See Comments) and Palpitations    REACTION: palpitations, diaphoresis    Metabolic Disorder Labs: Lab Results  Component Value Date   HGBA1C 6.2 (H) 03/06/2017   MPG 131.24 03/06/2017   MPG 108 05/17/2015   Lab Results  Component Value Date   PROLACTIN 4.5 (L) 06/13/2020   PROLACTIN 64.2 (H) 03/06/2017   Lab Results  Component Value Date   CHOL 146 08/22/2018   TRIG 172 (H) 08/22/2018   HDL 49 08/22/2018   CHOLHDL 3.0 08/22/2018   VLDL 34 08/22/2018   LDLCALC 63 08/22/2018   LDLCALC 59 03/06/2017   Lab Results  Component Value Date   TSH 0.959 08/22/2018   TSH 2.766 08/24/2017    Therapeutic Level Labs: No results found for: "LITHIUM" Lab Results  Component Value Date   VALPROATE 43 (L) 09/25/2022   No results found for: "CBMZ"  Current Medications: Current Outpatient Medications  Medication Sig Dispense Refill   ACCU-CHEK GUIDE test strip      acetaminophen (TYLENOL) 500 MG tablet Take 1,000 mg by mouth every 6 (six) hours as needed for moderate pain or headache.      albuterol (PROVENTIL HFA;VENTOLIN HFA) 108 (90 Base) MCG/ACT inhaler Inhale 2 puffs into the lungs every 6 (six) hours as needed for wheezing or shortness of breath.     Alcohol Swabs (B-D SINGLE USE SWABS REGULAR) PADS      amitriptyline (ELAVIL) 50 MG tablet TAKE 1 AND 1/2 TABLETS AT BEDTIME 135 tablet 1   aspirin EC 81 MG tablet Take 81 mg by mouth  daily. Swallow whole.     B-D ULTRAFINE III SHORT PEN 31G X 8 MM MISC Inject into the skin.     Blood Glucose Calibration (ACCU-CHEK AVIVA) SOLN      Blood Glucose Monitoring Suppl (ACCU-CHEK AVIVA PLUS) w/Device KIT      buprenorphine (BUTRANS) 20 MCG/HR PTWK Place onto the skin.     butalbital-acetaminophen-caffeine (FIORICET) 50-325-40 MG tablet      carbidopa-levodopa (SINEMET CR) 50-200 MG tablet Take 1 tablet by mouth at bedtime.     carbidopa-levodopa (SINEMET IR) 25-100 MG tablet Take 1 tablet by mouth 3 (three) times daily.     Cholecalciferol (VITAMIN D-3) 125 MCG (5000 UT) TABS Take 5,000 Units by mouth daily.     furosemide (LASIX) 40 MG tablet Take 40 mg by mouth as directed. Takes every other day     insulin aspart (FIASP FLEXTOUCH) 100 UNIT/ML FlexTouch Pen Inject 10 Units into the skin daily in the afternoon. Takes 10 units at 10:00 am, 16 units at 4 pm, 10 units at 10 pm.     insulin degludec (TRESIBA FLEXTOUCH) 100 UNIT/ML FlexTouch Pen Inject 38 Units into the skin daily. At 10:00 pm     levothyroxine (SYNTHROID) 50 MCG tablet Take by mouth.     metFORMIN (GLUCOPHAGE) 500 MG tablet Take by mouth.     metoprolol tartrate (LOPRESSOR) 25 MG tablet Take 25 mg by mouth 2 (two) times daily.     nitroGLYCERIN (NITRODUR - DOSED IN MG/24 HR) 0.4 mg/hr patch Place 0.4 mg onto the skin as needed.     OLANZapine (ZYPREXA) 5 MG tablet Take 0.5 tablets (2.5 mg total) by mouth at bedtime. 90 tablet 3   Omega-3 Fatty Acids (FISH OIL) 1000 MG CAPS Take by mouth.  omeprazole (PRILOSEC) 40 MG capsule      ondansetron (ZOFRAN) 4 MG tablet Take 4 mg by mouth as needed for nausea or vomiting.     oxyCODONE (OXY IR/ROXICODONE) 5 MG immediate release tablet Take 5-10 mg by mouth daily as needed.     pravastatin (PRAVACHOL) 40 MG tablet Take by mouth.     rizatriptan (MAXALT) 10 MG tablet Take 10 mg by mouth 2 (two) times daily as needed for migraine. May repeat in 2 hours if needed      rOPINIRole (REQUIP) 0.5 MG tablet Take 0.5 mg by mouth 3 (three) times daily.     torsemide (DEMADEX) 20 MG tablet Take 20 mg by mouth daily.     No current facility-administered medications for this visit.     Musculoskeletal: Strength & Muscle Tone:  UTA Gait & Station:  UTA Patient leans: N/A  Psychiatric Specialty Exam: Review of Systems  Constitutional:  Positive for fatigue.  HENT:  Positive for sore throat.   Psychiatric/Behavioral:  Positive for hallucinations.     There were no vitals taken for this visit.There is no height or weight on file to calculate BMI.  General Appearance:  UTA  Eye Contact:   UTA  Speech:  Clear and Coherent  Volume:  Decreased  Mood:  Euthymic  Affect:   UTA  Thought Process:  Goal Directed and Descriptions of Associations: Intact  Orientation:  Other:  was able to give the day, month sate  Thought Content: Hallucinations: Auditory reports coping well  Suicidal Thoughts:  No  Homicidal Thoughts:  No  Memory:  Immediate;   Fair Recent;   Fair Remote;   Fair  Judgement:  Fair  Insight:  Fair  Psychomotor Activity:  Normal  Concentration:  Concentration: Fair and Attention Span: Fair  Recall:  [employer] of Knowledge: Fair  Language: Fair  Akathisia:  No  Handed:  Right  AIMS (if indicated): not done  Assets:  Communication Skills Desire for Improvement Housing Social Support  ADL's:  Intact  Cognition: WNL  Sleep:  Fair   Screenings: Geneticist, molecular Office Visit from 09/08/2022 in [facility] Office Visit from 04/27/2022 in [facility] Office Visit from 03/16/2022 in [facility] Office Visit from 12/14/2021 in [facility] Office Visit from 08/28/2021 in [facility]  AIMS Total Score 5 0 0 0 0      AUDIT    Flowsheet Row  Admission (Discharged) from OP Visit from 08/30/2018 [facility] Ad[facility] (Discharged[facility]  Alcohol Use Disorder Identification Test Final Score (AUDIT) 0 0 0      GAD-7    Flowsheet Row Office Visit from 09/08/2022 in [facility] Office Visit from 04/27/2022 in [facility] Office Visit from 03/16/2022 in [facility] Office Visit from 12/14/2021 in [facility] Office Visit from 08/28/2021 in [facility]  Total GAD-7 Score 5 5 5 8 5       PHQ2-9    Flowsheet Row Office Visit from 09/08/2022 in [facility] Office Visit from 04/27/2022 in [facility] Office Visit from 03/16/2022  in [facility] Office Visit from 12/14/2021 in [facility] Office Visit from 08/28/2021 in [facility]  PHQ-2 Total Score 2 0 0 1 0  PHQ-9 Total Score 6 8 6  -- --      Flowsheet Row Office Visit from 09/08/2022 in [facility] Admission (Discharged) from 05/25/2022 in [facility] Office Visit from 04/27/2022 in [facility]  C-SSRS RISK CATEGORY No Risk No Risk No Risk        Assessment and Plan: KEYLAH ZIMMEL is a 62 year old Caucasian female who has a history of schizoaffective disorder, PTSD, migraine headaches, multiple medical problems was evaluated by phone today.  Patient is currently doing well on the current medication regimen, currently  off of the Depakote due to side effects.  Plan as noted below.  Plan Schizoaffective disorder-improving Continue Zyprexa at reduced dosage of 2.5 mg p.o. nightly Discontinue Depakote-patient no longer on it, developed thrombocytopenia likely due to the medication, currently under the care of primary care provider.  GAD-improving Elavil 75 mg p.o. nightly  PTSD-stable Elavil 75 mg p.o. nightly  Insomnia-stable Elavil and Zyprexa as prescribed  Drug-induced Parkinson's disease-stable Continue carbidopa/levodopa-currently under the care of neurology.  Reviewed and discussed labs-CBC with differential-10/01/2022-platelet count-165.  CMP within normal limits except for blood glucose level mildly elevated.  TSH labs-low at 0.268.  Patient to continue to follow up with her primary provider for the same.      Collaboration of Care: Collaboration of Care: Primary Care Provider AEB patient encouraged to follow up with primary care provider.  Patient/Guardian was advised Release of Information must be obtained prior to any record release in order to collaborate their care with an outside provider. Patient/Guardian was advised if they have not already done so to contact the registration department to sign all necessary forms in order for [place] to release information regarding their care.   Consent: Patient/Guardian gives verbal consent for treatment and assignment of benefits for services provided during this visit. Patient/Guardian expressed understanding and agreed to proceed.   Follow-up in clinic in 3 months or sooner in person. I have spent atleast 22 minutes non face to face with patient today.  This note was generated in part or whole with voice recognition software. Voice recognition is usually quite accurate but there are transcription errors that can and very often do occur. I apologize for any typographical errors that were not detected and corrected.    Jomarie Longs,  MD 10/27/2022, 3:23 PM

## 2022-11-03 DIAGNOSIS — R8290 Unspecified abnormal findings in urine: Secondary | ICD-10-CM | POA: Diagnosis not present

## 2022-11-03 DIAGNOSIS — R399 Unspecified symptoms and signs involving the genitourinary system: Secondary | ICD-10-CM | POA: Diagnosis not present

## 2022-11-09 DIAGNOSIS — Z794 Long term (current) use of insulin: Secondary | ICD-10-CM | POA: Diagnosis not present

## 2022-11-09 DIAGNOSIS — Z6841 Body Mass Index (BMI) 40.0 and over, adult: Secondary | ICD-10-CM | POA: Diagnosis not present

## 2022-11-09 DIAGNOSIS — I10 Essential (primary) hypertension: Secondary | ICD-10-CM | POA: Diagnosis not present

## 2022-11-09 DIAGNOSIS — R2689 Other abnormalities of gait and mobility: Secondary | ICD-10-CM | POA: Diagnosis not present

## 2022-11-09 DIAGNOSIS — F209 Schizophrenia, unspecified: Secondary | ICD-10-CM | POA: Diagnosis not present

## 2022-11-09 DIAGNOSIS — E1165 Type 2 diabetes mellitus with hyperglycemia: Secondary | ICD-10-CM | POA: Diagnosis not present

## 2022-11-16 DIAGNOSIS — E785 Hyperlipidemia, unspecified: Secondary | ICD-10-CM | POA: Diagnosis not present

## 2022-11-16 DIAGNOSIS — D696 Thrombocytopenia, unspecified: Secondary | ICD-10-CM | POA: Diagnosis not present

## 2022-11-16 DIAGNOSIS — E1165 Type 2 diabetes mellitus with hyperglycemia: Secondary | ICD-10-CM | POA: Diagnosis not present

## 2022-11-16 DIAGNOSIS — Z794 Long term (current) use of insulin: Secondary | ICD-10-CM | POA: Diagnosis not present

## 2022-11-16 DIAGNOSIS — F209 Schizophrenia, unspecified: Secondary | ICD-10-CM | POA: Diagnosis not present

## 2022-11-16 DIAGNOSIS — G4733 Obstructive sleep apnea (adult) (pediatric): Secondary | ICD-10-CM | POA: Diagnosis present

## 2022-11-16 DIAGNOSIS — R519 Headache, unspecified: Secondary | ICD-10-CM | POA: Diagnosis not present

## 2022-11-16 DIAGNOSIS — Z23 Encounter for immunization: Secondary | ICD-10-CM | POA: Diagnosis not present

## 2022-11-16 DIAGNOSIS — I10 Essential (primary) hypertension: Secondary | ICD-10-CM | POA: Diagnosis not present

## 2022-11-16 DIAGNOSIS — G2119 Other drug induced secondary parkinsonism: Secondary | ICD-10-CM | POA: Diagnosis not present

## 2022-11-22 ENCOUNTER — Inpatient Hospital Stay: Payer: Medicare HMO | Attending: Oncology | Admitting: Oncology

## 2022-11-22 ENCOUNTER — Encounter: Payer: Self-pay | Admitting: Oncology

## 2022-11-22 ENCOUNTER — Inpatient Hospital Stay: Payer: Medicare HMO

## 2022-11-22 VITALS — BP 120/70 | HR 72 | Temp 97.4°F | Resp 18 | Wt 247.9 lb

## 2022-11-22 DIAGNOSIS — K760 Fatty (change of) liver, not elsewhere classified: Secondary | ICD-10-CM | POA: Diagnosis not present

## 2022-11-22 DIAGNOSIS — Z886 Allergy status to analgesic agent status: Secondary | ICD-10-CM | POA: Diagnosis not present

## 2022-11-22 DIAGNOSIS — F209 Schizophrenia, unspecified: Secondary | ICD-10-CM | POA: Diagnosis not present

## 2022-11-22 DIAGNOSIS — D696 Thrombocytopenia, unspecified: Secondary | ICD-10-CM | POA: Insufficient documentation

## 2022-11-22 DIAGNOSIS — Z79899 Other long term (current) drug therapy: Secondary | ICD-10-CM | POA: Diagnosis not present

## 2022-11-22 DIAGNOSIS — Z803 Family history of malignant neoplasm of breast: Secondary | ICD-10-CM | POA: Diagnosis not present

## 2022-11-22 DIAGNOSIS — Z888 Allergy status to other drugs, medicaments and biological substances status: Secondary | ICD-10-CM | POA: Insufficient documentation

## 2022-11-22 DIAGNOSIS — Z9049 Acquired absence of other specified parts of digestive tract: Secondary | ICD-10-CM | POA: Insufficient documentation

## 2022-11-22 DIAGNOSIS — Z8249 Family history of ischemic heart disease and other diseases of the circulatory system: Secondary | ICD-10-CM | POA: Diagnosis not present

## 2022-11-22 DIAGNOSIS — Z800 Family history of malignant neoplasm of digestive organs: Secondary | ICD-10-CM | POA: Insufficient documentation

## 2022-11-22 DIAGNOSIS — D469 Myelodysplastic syndrome, unspecified: Secondary | ICD-10-CM | POA: Insufficient documentation

## 2022-11-22 DIAGNOSIS — Z90710 Acquired absence of both cervix and uterus: Secondary | ICD-10-CM | POA: Diagnosis not present

## 2022-11-22 DIAGNOSIS — Z87442 Personal history of urinary calculi: Secondary | ICD-10-CM | POA: Diagnosis not present

## 2022-11-22 DIAGNOSIS — Z811 Family history of alcohol abuse and dependence: Secondary | ICD-10-CM | POA: Insufficient documentation

## 2022-11-22 DIAGNOSIS — Z885 Allergy status to narcotic agent status: Secondary | ICD-10-CM | POA: Insufficient documentation

## 2022-11-22 DIAGNOSIS — Z87891 Personal history of nicotine dependence: Secondary | ICD-10-CM | POA: Insufficient documentation

## 2022-11-22 DIAGNOSIS — R5383 Other fatigue: Secondary | ICD-10-CM | POA: Diagnosis not present

## 2022-11-22 DIAGNOSIS — C9200 Acute myeloblastic leukemia, not having achieved remission: Secondary | ICD-10-CM | POA: Insufficient documentation

## 2022-11-22 DIAGNOSIS — Z880 Allergy status to penicillin: Secondary | ICD-10-CM | POA: Diagnosis not present

## 2022-11-22 LAB — CBC WITH DIFFERENTIAL/PLATELET
Abs Immature Granulocytes: 0.02 10*3/uL (ref 0.00–0.07)
Basophils Absolute: 0 10*3/uL (ref 0.0–0.1)
Basophils Relative: 0 %
Eosinophils Absolute: 0 10*3/uL (ref 0.0–0.5)
Eosinophils Relative: 1 %
HCT: 35.9 % — ABNORMAL LOW (ref 36.0–46.0)
Hemoglobin: 11.9 g/dL — ABNORMAL LOW (ref 12.0–15.0)
Immature Granulocytes: 1 %
Lymphocytes Relative: 52 %
Lymphs Abs: 1.4 10*3/uL (ref 0.7–4.0)
MCH: 31.5 pg (ref 26.0–34.0)
MCHC: 33.1 g/dL (ref 30.0–36.0)
MCV: 95 fL (ref 80.0–100.0)
Monocytes Absolute: 0 10*3/uL — ABNORMAL LOW (ref 0.1–1.0)
Monocytes Relative: 1 %
Neutro Abs: 1.2 10*3/uL — ABNORMAL LOW (ref 1.7–7.7)
Neutrophils Relative %: 45 %
Platelets: 91 10*3/uL — ABNORMAL LOW (ref 150–400)
RBC: 3.78 MIL/uL — ABNORMAL LOW (ref 3.87–5.11)
RDW: 15.5 % (ref 11.5–15.5)
Smear Review: NORMAL
WBC: 2.6 10*3/uL — ABNORMAL LOW (ref 4.0–10.5)
nRBC: 1.1 % — ABNORMAL HIGH (ref 0.0–0.2)

## 2022-11-22 LAB — HEPATITIS PANEL, ACUTE
HCV Ab: NONREACTIVE
Hep A IgM: NONREACTIVE
Hep B C IgM: NONREACTIVE
Hepatitis B Surface Ag: NONREACTIVE

## 2022-11-22 LAB — LACTATE DEHYDROGENASE: LDH: 123 U/L (ref 98–192)

## 2022-11-22 LAB — IMMATURE PLATELET FRACTION: Immature Platelet Fraction: 7.1 % (ref 1.2–8.6)

## 2022-11-22 LAB — TECHNOLOGIST SMEAR REVIEW: Plt Morphology: DECREASED

## 2022-11-22 LAB — FOLATE: Folate: 13 ng/mL (ref >5.9)

## 2022-11-22 LAB — HIV ANTIBODY (ROUTINE TESTING W REFLEX): HIV Screen 4th Generation wRfx: NONREACTIVE

## 2022-11-22 LAB — VITAMIN B12: Vitamin B-12: 627 pg/mL (ref 180–914)

## 2022-11-22 NOTE — Assessment & Plan Note (Signed)
For the work up of patient's thrombocytopenia, I recommend checking CBC;CMP, immature platelet fraction, LDH; smear review, folate, Vitamin B12, hepatitis, HIV, flowcytometry and monoclonal gammopathy workup.

## 2022-11-22 NOTE — Progress Notes (Signed)
Hematology/Oncology Consult Note [phone] [phone]     REFERRING PROVIDER: Barbette Reichmann, MD  CHIEF COMPLAINTS/REASON FOR VISIT:  Evaluation of thrombocytopenia  ASSESSMENT & PLAN:   Thrombocytopenia (HCC) For the work up of patient's thrombocytopenia, I recommend checking CBC;CMP, immature platelet fraction, LDH; smear review, folate, Vitamin B12, hepatitis, HIV, flowcytometry and monoclonal gammopathy workup.     Orders Placed This Encounter  Procedures   Vitamin B12    Standing Status:   Future    Number of Occurrences:   1    Standing Expiration Date:   11/22/2023   CBC with Differential/Platelet    Standing Status:   Future    Number of Occurrences:   1    Standing Expiration Date:   11/22/2023   Immature Platelet Fraction    Standing Status:   Future    Number of Occurrences:   1    Standing Expiration Date:   11/22/2023   Multiple Myeloma Panel (SPEP&IFE w/QIG)    Standing Status:   Future    Number of Occurrences:   1    Standing Expiration Date:   11/22/2023   Kappa/lambda light chains    Standing Status:   Future    Number of Occurrences:   1    Standing Expiration Date:   11/22/2023   Flow cytometry panel-leukemia/lymphoma work-up    Standing Status:   Future    Number of Occurrences:   1    Standing Expiration Date:   11/22/2023   Lactate dehydrogenase    Standing Status:   Future    Number of Occurrences:   1    Standing Expiration Date:   11/22/2023   HIV Antibody (routine testing w rflx)    Standing Status:   Future    Number of Occurrences:   1    Standing Expiration Date:   11/22/2023   Hepatitis panel, acute    Standing Status:   Future    Number of Occurrences:   1    Standing Expiration Date:   11/22/2023   Folate    Standing Status:   Future    Number of Occurrences:   1    Standing Expiration Date:   11/22/2023   Technologist smear review    Order Specific Question:   Clinical information:    Answer:    thrombocytopenia    All questions were answered. The patient knows to call the clinic with any problems, questions or concerns.  Rickard Patience, MD, PhD [facility] Hematology Oncology 11/22/2022   HISTORY OF PRESENTING ILLNESS:  Heather Haley is a 62 y.o. female who was seen in consultation at the request of Barbette Reichmann, MD for evaluation of thrombocytopenia  The decrease in platelet count was first noted in August, with a drop from 165 to 137, and has continued to decline. The patient denies any new medications and reports no alcohol use. She takes Tylenol, fish oil, B12, and Vitamin D as over-the-counter supplements.  The patient's recent ultrasound revealed a borderline increase in spleen size and confirmed the presence of fatty liver disease. The patient has been experiencing fatigue and excessive sleep, but denies any unintentional weight loss, night sweats, or fever. Her weight has been stable  Denies hematochezia, hematuria, hematemesis, epistaxis, black tarry stool.  Patient has no easy bruising.     Denies history hepatitis or HIV infection Denies history of chronic liver disease Denies routine alcohol consumption. Denies dietary restrictions.  Denies herbal medication  The  patient has not previously been diagnosed with EBV virus infection or mono.     MEDICAL HISTORY:  Past Medical History:  Diagnosis Date   Anxiety    Asthma    Chronic kidney disease    Chronic pain    previously saw Dr. Shireen Quan in pain clinic, then saw pain specialist in [facility]   Depression    Diabetes mellitus (HCC)    Frequency of urination    GERD (gastroesophageal reflux disease)    Headache(784.0)    High cholesterol    History of kidney stones    Hypertension    IBS (irritable bowel syndrome)    Left ankle instability    Left knee DJD    Lumbar Degenerative Disc Disease of  10/11/2014   Neuromuscular disorder (HCC)    Osteoarthritis of hip (Right) 05/05/2015   Other  enthesopathy of ankle and tarsus 12/15/2009   Qualifier: Diagnosis of  By: Darrick Penna MD, KARL     Parkinson's disease (HCC)    Peripheral sensory neuropathy (Bilateral) 11/19/2014   Postoperative nausea and vomiting    Schizophrenia (HCC)     SURGICAL HISTORY: Past Surgical History:  Procedure Laterality Date   ABDOMINAL HYSTERECTOMY     ANKLE SURGERY Left    x 2   APPENDECTOMY     CARDIAC CATHETERIZATION     COLONOSCOPY  2013   COLONOSCOPY WITH PROPOFOL N/A 04/25/2017   Procedure: COLONOSCOPY WITH PROPOFOL;  Surgeon: Scot Jun, MD;  Location: [facility] ENDOSCOPY;  Service: Endoscopy;  Laterality: N/A;   COLONOSCOPY WITH PROPOFOL N/A 05/25/2022   Procedure: COLONOSCOPY WITH PROPOFOL;  Surgeon: [name], MD;  Location: [facility] ENDOSCOPY;  Service: Endoscopy;  Laterality: N/A;   CYSTOSCOPY/URETEROSCOPY/HOLMIUM LASER/STENT PLACEMENT Left 09/11/2019   Procedure: CYSTOSCOPY/URETEROSCOPY/HOLMIUM LASER/STENT PLACEMENT;  Surgeon: Riki Altes, MD;  Location: [facility] ORS;  Service: Urology;  Laterality: Left;   ESOPHAGOGASTRODUODENOSCOPY (EGD) WITH PROPOFOL N/A 10/03/2014   Procedure: ESOPHAGOGASTRODUODENOSCOPY (EGD) WITH PROPOFOL;  Surgeon: Elnita Maxwell, MD;  Location: [facility] ENDOSCOPY;  Service: Endoscopy;  Laterality: N/A;   ESOPHAGOGASTRODUODENOSCOPY (EGD) WITH PROPOFOL  04/25/2017   Procedure: ESOPHAGOGASTRODUODENOSCOPY (EGD) WITH PROPOFOL;  Surgeon: Scot Jun, MD;  Location: [facility] ENDOSCOPY;  Service: Endoscopy;;   ESOPHAGOGASTRODUODENOSCOPY (EGD) WITH PROPOFOL N/A 05/25/2022   Procedure: ESOPHAGOGASTRODUODENOSCOPY (EGD) WITH PROPOFOL;  Surgeon: [name], MD;  Location: [facility] ENDOSCOPY;  Service: Endoscopy;  Laterality: N/A;   JOINT REPLACEMENT Left    knee   KNEE ARTHROSCOPY  1997   left knee   LEFT HEART CATH AND CORONARY ANGIOGRAPHY Left 11/10/2017   Procedure: LEFT HEART CATH AND CORONARY ANGIOGRAPHY;  Surgeon: Marcina Millard, MD;  Location: [facility]  INVASIVE CV LAB;  Service: Cardiovascular;  Laterality: Left;   LEFT HEART CATHETERIZATION WITH CORONARY ANGIOGRAM N/A 01/11/2013   Procedure: LEFT HEART CATHETERIZATION WITH CORONARY ANGIOGRAM;  Surgeon: [name], MD;  Location: [facility] CATH LAB;  Service: Cardiovascular;  Laterality: N/A;   TOTAL KNEE ARTHROPLASTY  08/30/2011   Procedure: TOTAL KNEE ARTHROPLASTY;  Surgeon: Nilda Simmer, MD;  Location: [facility] OR;  Service: Orthopedics;  Laterality: Left;    SOCIAL HISTORY: Social History   Socioeconomic History   Marital status: Divorced    Spouse name: Not on file   Number of children: 2   Years of education: 11   Highest education level: 11th grade  Occupational History    Employer: [employer]  Tobacco Use   Smoking status: Former    Current packs/day: 0.00  Average packs/day: 2.0 packs/day for 15.0 years (30.0 ttl pk-yrs)    Types: Cigarettes    Start date: 02/09/1980    Quit date: 02/09/1995    Years since quitting: 27.8   Smokeless tobacco: Never  Vaping Use   Vaping status: Never Used  Substance and Sexual Activity   Alcohol use: No    Alcohol/week: 0.0 standard drinks of alcohol   Drug use: No   Sexual activity: Not Currently  Other Topics Concern   Not on file  Social History Narrative   Patient lives at home alone. Patient works at [employer].   Caffeine daily- 2   Right handed.   Education- 11 th grade   Social Determinants of Health   Financial Resource Strain: Low Risk  (11/16/2022)   Received from [facility]   Overall Financial Resource Strain (CARDIA)    Difficulty of Paying Living Expenses: Not hard at all  Food Insecurity: No Food Insecurity (11/22/2022)   Hunger Vital Sign    Worried About Running Out of Food in the Last Year: Never true    Ran Out of Food in the Last Year: Never true  Transportation Needs: No Transportation Needs (11/22/2022)   PRAPARE - Administrator, Civil Service (Medical): No    Lack of  Transportation (Non-Medical): No  Physical Activity: Inactive (03/24/2017)   Exercise Vital Sign    Days of Exercise per Week: 0 days    Minutes of Exercise per Session: 0 min  Stress: Stress Concern Present (03/24/2017)   [employer] of Occupational Health - Occupational Stress Questionnaire    Feeling of Stress : Rather much  Social Connections: Unknown (01/12/2022)   Received from [facility]   Social Network    Social Network: Not on file  Intimate Partner Violence: Not At Risk (11/22/2022)   Humiliation, Afraid, Rape, and Kick questionnaire    Fear of Current or Ex-Partner: No    Emotionally Abused: No    Physically Abused: No    Sexually Abused: No    FAMILY HISTORY: Family History  Problem Relation Age of Onset   Cancer Mother    Colon cancer Mother    Hypertension Father    Heart disease Father    Alcohol abuse Father    Breast cancer Sister     ALLERGIES:  is allergic to prolixin decanoate [fluphenazine], benztropine, benztropine mesylate, buprenorphine hcl, carbidopa-levodopa, codeine, cymbalta [duloxetine hcl], gabapentin, lyrica [pregabalin], morphine and codeine, nortripytline hcl [nortriptyline], nsaids, other, penicillins, risperidone and related, wellbutrin [bupropion], and tolmetin.  MEDICATIONS:  Current Outpatient Medications  Medication Sig Dispense Refill   amitriptyline (ELAVIL) 50 MG tablet TAKE 1 AND 1/2 TABLETS AT BEDTIME 135 tablet 1   aspirin EC 81 MG tablet Take 81 mg by mouth daily. Swallow whole.     carbidopa-levodopa (SINEMET CR) 50-200 MG tablet Take 1 tablet by mouth at bedtime.     carbidopa-levodopa (SINEMET IR) 25-100 MG tablet Take 1 tablet by mouth 3 (three) times daily.     Cholecalciferol (VITAMIN D-3) 125 MCG (5000 UT) TABS Take 1,000 Units by mouth daily.     insulin aspart (FIASP FLEXTOUCH) 100 UNIT/ML FlexTouch Pen Inject 10 Units into the skin daily in the afternoon. Takes 10 units at 10:00 am, 16 units at 4  pm, 10 units at 10 pm.     insulin degludec (TRESIBA FLEXTOUCH) 100 UNIT/ML FlexTouch Pen Inject 38 Units into the skin daily. At 10:00 pm  levothyroxine (SYNTHROID) 50 MCG tablet Take by mouth.     metFORMIN (GLUCOPHAGE) 500 MG tablet Take 1,000 mg by mouth daily with breakfast.     metoprolol tartrate (LOPRESSOR) 25 MG tablet Take 25 mg by mouth 2 (two) times daily.     OLANZapine (ZYPREXA) 5 MG tablet Take 0.5 tablets (2.5 mg total) by mouth at bedtime. 90 tablet 3   Omega-3 Fatty Acids (FISH OIL) 1000 MG CAPS Take by mouth.     oxyCODONE (OXY IR/ROXICODONE) 5 MG immediate release tablet Take 5-10 mg by mouth daily as needed.     pravastatin (PRAVACHOL) 40 MG tablet Take by mouth.     rOPINIRole (REQUIP) 0.5 MG tablet Take 0.5 mg by mouth 3 (three) times daily.     torsemide (DEMADEX) 20 MG tablet Take 20 mg by mouth every other day.     ACCU-CHEK GUIDE test strip  (Patient not taking: Reported on 11/22/2022)     acetaminophen (TYLENOL) 500 MG tablet Take 1,000 mg by mouth every 6 (six) hours as needed for moderate pain or headache.  (Patient not taking: Reported on 11/22/2022)     albuterol (PROVENTIL HFA;VENTOLIN HFA) 108 (90 Base) MCG/ACT inhaler Inhale 2 puffs into the lungs every 6 (six) hours as needed for wheezing or shortness of breath. (Patient not taking: Reported on 11/22/2022)     Alcohol Swabs (B-D SINGLE USE SWABS REGULAR) PADS  (Patient not taking: Reported on 11/22/2022)     B-D ULTRAFINE III SHORT PEN 31G X 8 MM MISC Inject into the skin. (Patient not taking: Reported on 11/22/2022)     Blood Glucose Calibration (ACCU-CHEK AVIVA) SOLN  (Patient not taking: Reported on 11/22/2022)     Blood Glucose Monitoring Suppl (ACCU-CHEK AVIVA PLUS) w/Device KIT  (Patient not taking: Reported on 11/22/2022)     buprenorphine (BUTRANS) 20 MCG/HR PTWK Place onto the skin. (Patient not taking: Reported on 11/22/2022)     butalbital-acetaminophen-caffeine (FIORICET) 50-325-40 MG tablet   (Patient not taking: Reported on 11/22/2022)     furosemide (LASIX) 40 MG tablet Take 40 mg by mouth as directed. Takes every other day (Patient not taking: Reported on 11/22/2022)     nitroGLYCERIN (NITRODUR - DOSED IN MG/24 HR) 0.4 mg/hr patch Place 0.4 mg onto the skin as needed. (Patient not taking: Reported on 11/22/2022)     omeprazole (PRILOSEC) 40 MG capsule  (Patient not taking: Reported on 11/22/2022)     ondansetron (ZOFRAN) 4 MG tablet Take 4 mg by mouth as needed for nausea or vomiting. (Patient not taking: Reported on 11/22/2022)     rizatriptan (MAXALT) 10 MG tablet Take 10 mg by mouth 2 (two) times daily as needed for migraine. May repeat in 2 hours if needed (Patient not taking: Reported on 11/22/2022)     No current facility-administered medications for this visit.    Review of Systems  Constitutional:  Positive for fatigue. Negative for appetite change, chills and fever.  HENT:   Negative for hearing loss and voice change.   Eyes:  Negative for eye problems.  Respiratory:  Negative for chest tightness and cough.   Cardiovascular:  Negative for chest pain.  Gastrointestinal:  Negative for abdominal distention, abdominal pain and blood in stool.  Endocrine: Negative for hot flashes.  Genitourinary:  Negative for difficulty urinating and frequency.   Musculoskeletal:  Negative for arthralgias.  Skin:  Negative for itching and rash.  Neurological:  Negative for extremity weakness.  Hematological:  Negative for adenopathy.  Psychiatric/Behavioral:  Negative for confusion.     PHYSICAL EXAMINATION: ECOG PERFORMANCE STATUS: 1 - Symptomatic but completely ambulatory Vitals:   11/22/22 1458  BP: 120/70  Pulse: 72  Resp: 18  Temp: (!) 97.4 F (36.3 C)   Filed Weights   11/22/22 1458  Weight: 247 lb 14.4 oz (112.4 kg)    Physical Exam Constitutional:      General: She is not in acute distress.    Appearance: She is obese.  HENT:     Head: Normocephalic and  atraumatic.  Eyes:     General: No scleral icterus. Cardiovascular:     Rate and Rhythm: Normal rate and regular rhythm.     Heart sounds: Normal heart sounds.  Pulmonary:     Effort: Pulmonary effort is normal. No respiratory distress.     Breath sounds: No wheezing.  Abdominal:     General: Bowel sounds are normal. There is no distension.     Palpations: Abdomen is soft.  Musculoskeletal:        General: No deformity. Normal range of motion.     Cervical back: Normal range of motion and neck supple.  Skin:    General: Skin is warm and dry.     Findings: No erythema or rash.  Neurological:     Mental Status: She is alert and oriented to person, place, and time. Mental status is at baseline.     Cranial Nerves: No cranial nerve deficit.     Coordination: Coordination normal.  Psychiatric:        Mood and Affect: Mood normal.      LABORATORY DATA:  I have reviewed the data as listed    Latest Ref Rng & Units 11/22/2022    3:28 PM 09/25/2022    1:09 PM 08/30/2019    3:52 PM  CBC  WBC 4.0 - 10.5 K/uL 2.6   6.9   Hemoglobin 12.0 - 15.0 g/dL 91.4   78.2   Hematocrit 36.0 - 46.0 % 35.9   41.8   Platelets 150 - 400 K/uL 91  137  235       Latest Ref Rng & Units 09/25/2022    1:09 PM 07/09/2022    1:48 PM 03/01/2022    9:51 AM  CMP  Creatinine 0.44 - 1.00 mg/dL  9.56  2.13   Sodium 086 - 145 mmol/L 138     Total Protein 6.5 - 8.1 g/dL 7.8     Total Bilirubin 0.3 - 1.2 mg/dL 0.8     Alkaline Phos 38 - 126 U/L 74     AST 15 - 41 U/L 36     ALT 0 - 44 U/L 14        RADIOGRAPHIC STUDIES: I have personally reviewed the radiological images as listed and agreed with the findings in the report.  US Abdomen Complete  Result Date: 10/06/2022 CLINICAL DATA:  Thrombocytopenia. EXAM: ABDOMEN ULTRASOUND COMPLETE COMPARISON:  March 01, 2022 FINDINGS: Gallbladder: No gallstones or wall thickening visualized. No sonographic Murphy sign noted by sonographer. Common bile duct:  Diameter: 2.6 mm Liver: Increased echotexture. No focal lesion identified. Portal vein is patent on color Doppler imaging with normal direction of blood flow towards the liver. IVC: No abnormality visualized. Pancreas: Visualized portion unremarkable. Spleen: 13.6 cm with volume of 484. Right Kidney: Length: 11.5 cm. Echogenicity within normal limits. No mass or hydronephrosis visualized. Left Kidney: Length: 11.2 cm. Echogenicity within normal limits. No mass or hydronephrosis visualized. Abdominal aorta:  No aneurysm visualized. Other findings: None. IMPRESSION: 1. Mild splenomegaly. 2. Increased echotexture of the liver which may represent hepatic steatosis. Electronically Signed   By: Sherian Rein M.D.   On: 10/06/2022 08:26

## 2022-11-23 LAB — KAPPA/LAMBDA LIGHT CHAINS
Kappa free light chain: 35.4 mg/L — ABNORMAL HIGH (ref 3.3–19.4)
Kappa, lambda light chain ratio: 1.05 (ref 0.26–1.65)
Lambda free light chains: 33.7 mg/L — ABNORMAL HIGH (ref 5.7–26.3)

## 2022-11-24 LAB — COMP PANEL: LEUKEMIA/LYMPHOMA

## 2022-11-29 LAB — MULTIPLE MYELOMA PANEL, SERUM
Albumin SerPl Elph-Mcnc: 3.6 g/dL (ref 2.9–4.4)
Albumin/Glob SerPl: 1.1 (ref 0.7–1.7)
Alpha 1: 0.2 g/dL (ref 0.0–0.4)
Alpha2 Glob SerPl Elph-Mcnc: 0.6 g/dL (ref 0.4–1.0)
B-Globulin SerPl Elph-Mcnc: 1.3 g/dL (ref 0.7–1.3)
Gamma Glob SerPl Elph-Mcnc: 1.1 g/dL (ref 0.4–1.8)
Globulin, Total: 3.3 g/dL (ref 2.2–3.9)
IgA: 455 mg/dL — ABNORMAL HIGH (ref 87–352)
IgG (Immunoglobin G), Serum: 1107 mg/dL (ref 586–1602)
IgM (Immunoglobulin M), Srm: 70 mg/dL (ref 26–217)
Total Protein ELP: 6.9 g/dL (ref 6.0–8.5)

## 2022-12-07 DIAGNOSIS — E1159 Type 2 diabetes mellitus with other circulatory complications: Secondary | ICD-10-CM | POA: Diagnosis not present

## 2022-12-07 DIAGNOSIS — I10 Essential (primary) hypertension: Secondary | ICD-10-CM | POA: Diagnosis not present

## 2022-12-07 DIAGNOSIS — E782 Mixed hyperlipidemia: Secondary | ICD-10-CM | POA: Diagnosis not present

## 2022-12-07 DIAGNOSIS — E1122 Type 2 diabetes mellitus with diabetic chronic kidney disease: Secondary | ICD-10-CM | POA: Diagnosis not present

## 2022-12-07 DIAGNOSIS — N1831 Chronic kidney disease, stage 3a: Secondary | ICD-10-CM | POA: Diagnosis not present

## 2022-12-07 DIAGNOSIS — Z6841 Body Mass Index (BMI) 40.0 and over, adult: Secondary | ICD-10-CM | POA: Diagnosis not present

## 2022-12-07 DIAGNOSIS — E1165 Type 2 diabetes mellitus with hyperglycemia: Secondary | ICD-10-CM | POA: Diagnosis not present

## 2022-12-07 DIAGNOSIS — Z794 Long term (current) use of insulin: Secondary | ICD-10-CM | POA: Diagnosis not present

## 2022-12-20 ENCOUNTER — Telehealth: Payer: Self-pay

## 2022-12-20 ENCOUNTER — Encounter: Payer: Self-pay | Admitting: Oncology

## 2022-12-20 ENCOUNTER — Inpatient Hospital Stay: Payer: Medicare HMO | Attending: Oncology | Admitting: Oncology

## 2022-12-20 VITALS — BP 136/83 | HR 77 | Temp 96.0°F | Resp 18 | Wt 240.1 lb

## 2022-12-20 DIAGNOSIS — Z79899 Other long term (current) drug therapy: Secondary | ICD-10-CM | POA: Diagnosis not present

## 2022-12-20 DIAGNOSIS — Z885 Allergy status to narcotic agent status: Secondary | ICD-10-CM | POA: Diagnosis not present

## 2022-12-20 DIAGNOSIS — Z87442 Personal history of urinary calculi: Secondary | ICD-10-CM | POA: Diagnosis not present

## 2022-12-20 DIAGNOSIS — Z803 Family history of malignant neoplasm of breast: Secondary | ICD-10-CM | POA: Insufficient documentation

## 2022-12-20 DIAGNOSIS — Z811 Family history of alcohol abuse and dependence: Secondary | ICD-10-CM | POA: Insufficient documentation

## 2022-12-20 DIAGNOSIS — D709 Neutropenia, unspecified: Secondary | ICD-10-CM | POA: Diagnosis not present

## 2022-12-20 DIAGNOSIS — R161 Splenomegaly, not elsewhere classified: Secondary | ICD-10-CM | POA: Insufficient documentation

## 2022-12-20 DIAGNOSIS — Z888 Allergy status to other drugs, medicaments and biological substances status: Secondary | ICD-10-CM | POA: Insufficient documentation

## 2022-12-20 DIAGNOSIS — Z800 Family history of malignant neoplasm of digestive organs: Secondary | ICD-10-CM | POA: Diagnosis not present

## 2022-12-20 DIAGNOSIS — Z880 Allergy status to penicillin: Secondary | ICD-10-CM | POA: Diagnosis not present

## 2022-12-20 DIAGNOSIS — Z9049 Acquired absence of other specified parts of digestive tract: Secondary | ICD-10-CM | POA: Diagnosis not present

## 2022-12-20 DIAGNOSIS — D696 Thrombocytopenia, unspecified: Secondary | ICD-10-CM | POA: Insufficient documentation

## 2022-12-20 DIAGNOSIS — K760 Fatty (change of) liver, not elsewhere classified: Secondary | ICD-10-CM | POA: Insufficient documentation

## 2022-12-20 DIAGNOSIS — Z87891 Personal history of nicotine dependence: Secondary | ICD-10-CM | POA: Insufficient documentation

## 2022-12-20 DIAGNOSIS — Z8249 Family history of ischemic heart disease and other diseases of the circulatory system: Secondary | ICD-10-CM | POA: Insufficient documentation

## 2022-12-20 DIAGNOSIS — R5383 Other fatigue: Secondary | ICD-10-CM | POA: Diagnosis not present

## 2022-12-20 DIAGNOSIS — Z886 Allergy status to analgesic agent status: Secondary | ICD-10-CM | POA: Diagnosis not present

## 2022-12-20 DIAGNOSIS — Z809 Family history of malignant neoplasm, unspecified: Secondary | ICD-10-CM | POA: Insufficient documentation

## 2022-12-20 DIAGNOSIS — Z90710 Acquired absence of both cervix and uterus: Secondary | ICD-10-CM | POA: Diagnosis not present

## 2022-12-20 NOTE — Telephone Encounter (Signed)
Pt will need a bone marrow biopsy and follow up with MD in 1 week.

## 2022-12-20 NOTE — Progress Notes (Signed)
Hematology/Oncology Progress note [phone] [phone]       REFERRING PROVIDER: Barbette Reichmann, MD  CHIEF COMPLAINTS/REASON FOR VISIT:  Thrombocytopenia, neutropenia  ASSESSMENT & PLAN:   Thrombocytopenia (HCC) Labs are reviewed and discussed with patient.  Normal immature platelet fraction, normal LDH;normal folate, normal Vitamin B12, normal hepatitis, normal HIV, negative flowcytometry and no M protein. monoclonal gammopathy workup.   Thrombocytopenia could be due to mild splenomegaly, however, platelet count has rapidly decreased. Drug induced thrombocytopenia is another possibility however platelet count continues to decrease despite stop of the medication.  Also ANC has decreased.  Recommend bone marrow biopsy. She agrees.   Neutropenia (HCC) Pending above work up   Orders Placed This Encounter  Procedures   IR BONE MARROW BIOPSY & ASPIRATION    Standing Status:   Future    Standing Expiration Date:   12/20/2023    Order Specific Question:   Reason for Exam (SYMPTOM  OR DIAGNOSIS REQUIRED)    Answer:   thrombocytopenia, neutropenia    Order Specific Question:   Preferred Imaging Location?    Answer:   [facility]   Follow up after bone marrow biopsy All questions were answered. The patient knows to call the clinic with any problems, questions or concerns.  Rickard Patience, MD, PhD [facility] Hematology Oncology 12/20/2022   HISTORY OF PRESENTING ILLNESS:  Heather Haley is a 62 y.o. female who was seen in consultation at the request of Barbette Reichmann, MD for evaluation of thrombocytopenia  The decrease in platelet count was first noted in August, with a drop from 165 to 137, and has continued to decline. The patient denies any new medications and reports no alcohol use. She takes Tylenol, fish oil, B12, and Vitamin D as over-the-counter supplements.  The patient's recent ultrasound revealed a borderline increase in spleen size  and confirmed the presence of fatty liver disease. The patient has been experiencing fatigue and excessive sleep, but denies any unintentional weight loss, night sweats, or fever. Her weight has been stable  Denies hematochezia, hematuria, hematemesis, epistaxis, black tarry stool.  Patient has no easy bruising.     Denies history hepatitis or HIV infection Denies history of chronic liver disease Denies routine alcohol consumption. Denies dietary restrictions.  Denies herbal medication  The patient has not previously been diagnosed with EBV virus infection or mono.   INTERVAL HISTORY DEJAH FRASER is a 62 y.o. female who has above history reviewed by me today presents for follow up visit for thrombocytopenia. She had blood work up done and presents to discuss results.  She mentioned that she was briefly started on Depakote for 3 weeks, and stopped in Mid August  due to thrombocytopenia.   MEDICAL HISTORY:  Past Medical History:  Diagnosis Date   Anxiety    Asthma    Chronic kidney disease    Chronic pain    previously saw Dr. Shireen Quan in pain clinic, then saw pain specialist in [facility]   Depression    Diabetes mellitus (HCC)    Frequency of urination    GERD (gastroesophageal reflux disease)    Headache(784.0)    High cholesterol    History of kidney stones    Hypertension    IBS (irritable bowel syndrome)    Left ankle instability    Left knee DJD    Lumbar Degenerative Disc Disease of  10/11/2014   Neuromuscular disorder (HCC)    Osteoarthritis of hip (Right) 05/05/2015   Other enthesopathy of  ankle and tarsus 12/15/2009   Qualifier: Diagnosis of  By: FIELDS MD, KARL     Parkinson's disease (HCC)    Peripheral sensory neuropathy (Bilateral) 11/19/2014   Postoperative nausea and vomiting    Schizophrenia (HCC)     SURGICAL HISTORY: Past Surgical History:  Procedure Laterality Date   ABDOMINAL HYSTERECTOMY     ANKLE SURGERY Left    x 2   APPENDECTOMY      CARDIAC CATHETERIZATION     COLONOSCOPY  2013   COLONOSCOPY WITH PROPOFOL N/A 04/25/2017   Procedure: COLONOSCOPY WITH PROPOFOL;  Surgeon: Scot Jun, MD;  Location: [facility] ENDOSCOPY;  Service: Endoscopy;  Laterality: N/A;   COLONOSCOPY WITH PROPOFOL N/A 05/25/2022   Procedure: COLONOSCOPY WITH PROPOFOL;  Surgeon: [name], MD;  Location: [facility] ENDOSCOPY;  Service: Endoscopy;  Laterality: N/A;   CYSTOSCOPY/URETEROSCOPY/HOLMIUM LASER/STENT PLACEMENT Left 09/11/2019   Procedure: CYSTOSCOPY/URETEROSCOPY/HOLMIUM LASER/STENT PLACEMENT;  Surgeon: Riki Altes, MD;  Location: [facility] ORS;  Service: Urology;  Laterality: Left;   ESOPHAGOGASTRODUODENOSCOPY (EGD) WITH PROPOFOL N/A 10/03/2014   Procedure: ESOPHAGOGASTRODUODENOSCOPY (EGD) WITH PROPOFOL;  Surgeon: Elnita Maxwell, MD;  Location: [facility] ENDOSCOPY;  Service: Endoscopy;  Laterality: N/A;   ESOPHAGOGASTRODUODENOSCOPY (EGD) WITH PROPOFOL  04/25/2017   Procedure: ESOPHAGOGASTRODUODENOSCOPY (EGD) WITH PROPOFOL;  Surgeon: Scot Jun, MD;  Location: [facility] ENDOSCOPY;  Service: Endoscopy;;   ESOPHAGOGASTRODUODENOSCOPY (EGD) WITH PROPOFOL N/A 05/25/2022   Procedure: ESOPHAGOGASTRODUODENOSCOPY (EGD) WITH PROPOFOL;  Surgeon: [name], MD;  Location: [facility] ENDOSCOPY;  Service: Endoscopy;  Laterality: N/A;   JOINT REPLACEMENT Left    knee   KNEE ARTHROSCOPY  1997   left knee   LEFT HEART CATH AND CORONARY ANGIOGRAPHY Left 11/10/2017   Procedure: LEFT HEART CATH AND CORONARY ANGIOGRAPHY;  Surgeon: Marcina Millard, MD;  Location: [facility] INVASIVE CV LAB;  Service: Cardiovascular;  Laterality: Left;   LEFT HEART CATHETERIZATION WITH CORONARY ANGIOGRAM N/A 01/11/2013   Procedure: LEFT HEART CATHETERIZATION WITH CORONARY ANGIOGRAM;  Surgeon: [name], MD;  Location[facility];  Service: Cardiovascular;  Laterality: N/A;   TOTAL KNEE ARTHROPLASTY  08/30/2011   Procedure: TOTAL KNEE ARTHROPLASTY;  Surgeon: Nilda Simmer,  MD;  Location: [facility] OR;  Service: Orthopedics;  Laterality: Left;    SOCIAL HISTORY: Social History   Socioeconomic History   Marital status: Divorced    Spouse name: Not on file   Number of children: 2   Years of education: 11   Highest education level: 11th grade  Occupational History    Employer: [employer]  Tobacco Use   Smoking status: Former    Current packs/day: 0.00    Average packs/day: 2.0 packs/day for 15.0 years (30.0 ttl pk-yrs)    Types: Cigarettes    Start date: 02/09/1980    Quit date: 02/09/1995    Years since quitting: 27.8   Smokeless tobacco: Never  Vaping Use   Vaping status: Never Used  Substance and Sexual Activity   Alcohol use: No    Alcohol/week: 0.0 standard drinks of alcohol   Drug use: No   Sexual activity: Not Currently  Other Topics Concern   Not on file  Social History Narrative   Patient lives at home alone. Patient works at [employer].   Caffeine daily- 2   Right handed.   Education- 11 th grade   Social Determinants of Health   Financial Resource Strain: Low Risk  (11/16/2022)   Received from [facility]   Overall Financial Resource  Strain (CARDIA)    Difficulty of Paying Living Expenses: Not hard at all  Food Insecurity: No Food Insecurity (11/22/2022)   Hunger Vital Sign    Worried About Running Out of Food in the Last Year: Never true    Ran Out of Food in the Last Year: Never true  Transportation Needs: No Transportation Needs (11/22/2022)   PRAPARE - Administrator, Civil Service (Medical): No    Lack of Transportation (Non-Medical): No  Physical Activity: Inactive (03/24/2017)   Exercise Vital Sign    Days of Exercise per Week: 0 days    Minutes of Exercise per Session: 0 min  Stress: Stress Concern Present (03/24/2017)   [employer] of Occupational Health - Occupational Stress Questionnaire    Feeling of Stress : Rather much  Social Connections: Unknown (01/12/2022)   Received from  [facility]   Social Network    Social Network: Not on file  Intimate Partner Violence: Not At Risk (11/22/2022)   Humiliation, Afraid, Rape, and Kick questionnaire    Fear of Current or Ex-Partner: No    Emotionally Abused: No    Physically Abused: No    Sexually Abused: No    FAMILY HISTORY: Family History  Problem Relation Age of Onset   Cancer Mother    Colon cancer Mother    Hypertension Father    Heart disease Father    Alcohol abuse Father    Breast cancer Sister     ALLERGIES:  is allergic to prolixin decanoate [fluphenazine], benztropine, benztropine mesylate, buprenorphine hcl, carbidopa-levodopa, codeine, cymbalta [duloxetine hcl], gabapentin, lyrica [pregabalin], morphine and codeine, nortripytline hcl [nortriptyline], nsaids, other, penicillins, risperidone and related, wellbutrin [bupropion], and tolmetin.  MEDICATIONS:  Current Outpatient Medications  Medication Sig Dispense Refill   amitriptyline (ELAVIL) 50 MG tablet TAKE 1 AND 1/2 TABLETS AT BEDTIME 135 tablet 1   aspirin EC 81 MG tablet Take 81 mg by mouth daily. Swallow whole.     carbidopa-levodopa (SINEMET CR) 50-200 MG tablet Take 1 tablet by mouth at bedtime.     carbidopa-levodopa (SINEMET IR) 25-100 MG tablet Take 1 tablet by mouth 3 (three) times daily.     Cholecalciferol (VITAMIN D-3) 125 MCG (5000 UT) TABS Take 1,000 Units by mouth daily.     insulin aspart (FIASP FLEXTOUCH) 100 UNIT/ML FlexTouch Pen Inject 10 Units into the skin daily in the afternoon. Takes 10 units at 10:00 am, 16 units at 4 pm, 10 units at 10 pm.     insulin degludec (TRESIBA FLEXTOUCH) 100 UNIT/ML FlexTouch Pen Inject 38 Units into the skin daily. At 10:00 pm     metFORMIN (GLUCOPHAGE) 500 MG tablet Take 1,000 mg by mouth daily with breakfast.     metoprolol tartrate (LOPRESSOR) 25 MG tablet Take 25 mg by mouth 2 (two) times daily.     OLANZapine (ZYPREXA) 5 MG tablet Take 0.5 tablets (2.5 mg total) by mouth at  bedtime. 90 tablet 3   Omega-3 Fatty Acids (FISH OIL) 1000 MG CAPS Take by mouth.     oxyCODONE (OXY IR/ROXICODONE) 5 MG immediate release tablet Take 5-10 mg by mouth daily as needed.     pravastatin (PRAVACHOL) 40 MG tablet Take by mouth.     rOPINIRole (REQUIP) 0.5 MG tablet Take 0.5 mg by mouth 3 (three) times daily.     torsemide (DEMADEX) 20 MG tablet Take 20 mg by mouth every other day.     ACCU-CHEK GUIDE test strip  (Patient  not taking: Reported on 11/22/2022)     acetaminophen (TYLENOL) 500 MG tablet Take 1,000 mg by mouth every 6 (six) hours as needed for moderate pain or headache.  (Patient not taking: Reported on 11/22/2022)     albuterol (PROVENTIL HFA;VENTOLIN HFA) 108 (90 Base) MCG/ACT inhaler Inhale 2 puffs into the lungs every 6 (six) hours as needed for wheezing or shortness of breath. (Patient not taking: Reported on 11/22/2022)     Alcohol Swabs (B-D SINGLE USE SWABS REGULAR) PADS  (Patient not taking: Reported on 11/22/2022)     B-D ULTRAFINE III SHORT PEN 31G X 8 MM MISC Inject into the skin. (Patient not taking: Reported on 11/22/2022)     Blood Glucose Calibration (ACCU-CHEK AVIVA) SOLN  (Patient not taking: Reported on 11/22/2022)     Blood Glucose Monitoring Suppl (ACCU-CHEK AVIVA PLUS) w/Device KIT  (Patient not taking: Reported on 11/22/2022)     buprenorphine (BUTRANS) 20 MCG/HR PTWK Place onto the skin. (Patient not taking: Reported on 11/22/2022)     butalbital-acetaminophen-caffeine (FIORICET) 50-325-40 MG tablet  (Patient not taking: Reported on 11/22/2022)     furosemide (LASIX) 40 MG tablet Take 40 mg by mouth as directed. Takes every other day (Patient not taking: Reported on 11/22/2022)     levothyroxine (SYNTHROID) 50 MCG tablet Take by mouth.     nitroGLYCERIN (NITRODUR - DOSED IN MG/24 HR) 0.4 mg/hr patch Place 0.4 mg onto the skin as needed. (Patient not taking: Reported on 11/22/2022)     omeprazole (PRILOSEC) 40 MG capsule  (Patient not taking: Reported  on 11/22/2022)     ondansetron (ZOFRAN) 4 MG tablet Take 4 mg by mouth as needed for nausea or vomiting. (Patient not taking: Reported on 11/22/2022)     rizatriptan (MAXALT) 10 MG tablet Take 10 mg by mouth 2 (two) times daily as needed for migraine. May repeat in 2 hours if needed (Patient not taking: Reported on 11/22/2022)     No current facility-administered medications for this visit.    Review of Systems  Constitutional:  Positive for fatigue. Negative for appetite change, chills and fever.  HENT:   Negative for hearing loss and voice change.   Eyes:  Negative for eye problems.  Respiratory:  Negative for chest tightness and cough.   Cardiovascular:  Negative for chest pain.  Gastrointestinal:  Negative for abdominal distention, abdominal pain and blood in stool.  Endocrine: Negative for hot flashes.  Genitourinary:  Negative for difficulty urinating and frequency.   Musculoskeletal:  Negative for arthralgias.  Skin:  Negative for itching and rash.  Neurological:  Negative for extremity weakness.  Hematological:  Negative for adenopathy.  Psychiatric/Behavioral:  Negative for confusion.     PHYSICAL EXAMINATION: ECOG PERFORMANCE STATUS: 1 - Symptomatic but completely ambulatory Vitals:   12/20/22 1354  BP: 136/83  Pulse: 77  Resp: 18  Temp: (!) 96 F (35.6 C)  SpO2: 96%   Filed Weights   12/20/22 1354  Weight: 240 lb 1.6 oz (108.9 kg)    Physical Exam Constitutional:      General: She is not in acute distress.    Appearance: She is obese.  HENT:     Head: Normocephalic and atraumatic.  Eyes:     General: No scleral icterus. Cardiovascular:     Rate and Rhythm: Normal rate and regular rhythm.     Heart sounds: Normal heart sounds.  Pulmonary:     Effort: Pulmonary effort is normal. No respiratory distress.  Breath sounds: No wheezing.  Abdominal:     General: Bowel sounds are normal. There is no distension.     Palpations: Abdomen is soft.   Musculoskeletal:        General: No deformity. Normal range of motion.     Cervical back: Normal range of motion and neck supple.  Skin:    General: Skin is warm and dry.     Findings: No erythema or rash.  Neurological:     Mental Status: She is alert and oriented to person, place, and time. Mental status is at baseline.     Cranial Nerves: No cranial nerve deficit.     Coordination: Coordination normal.  Psychiatric:        Mood and Affect: Mood normal.      LABORATORY DATA:  I have reviewed the data as listed    Latest Ref Rng & Units 11/22/2022    3:28 PM 09/25/2022    1:09 PM 08/30/2019    3:52 PM  CBC  WBC 4.0 - 10.5 K/uL 2.6   6.9   Hemoglobin 12.0 - 15.0 g/dL 78.4   69.6   Hematocrit 36.0 - 46.0 % 35.9   41.8   Platelets 150 - 400 K/uL 91  137  235       Latest Ref Rng & Units 09/25/2022    1:09 PM 07/09/2022    1:48 PM 03/01/2022    9:51 AM  CMP  Creatinine 0.44 - 1.00 mg/dL  2.95  2.84   Sodium 132 - 145 mmol/L 138     Total Protein 6.5 - 8.1 g/dL 7.8     Total Bilirubin 0.3 - 1.2 mg/dL 0.8     Alkaline Phos 38 - 126 U/L 74     AST 15 - 41 U/L 36     ALT 0 - 44 U/L 14        RADIOGRAPHIC STUDIES: I have personally reviewed the radiological images as listed and agreed with the findings in the report.  US Abdomen Complete  Result Date: 10/06/2022 CLINICAL DATA:  Thrombocytopenia. EXAM: ABDOMEN ULTRASOUND COMPLETE COMPARISON:  March 01, 2022 FINDINGS: Gallbladder: No gallstones or wall thickening visualized. No sonographic Murphy sign noted by sonographer. Common bile duct: Diameter: 2.6 mm Liver: Increased echotexture. No focal lesion identified. Portal vein is patent on color Doppler imaging with normal direction of blood flow towards the liver. IVC: No abnormality visualized. Pancreas: Visualized portion unremarkable. Spleen: 13.6 cm with volume of 484. Right Kidney: Length: 11.5 cm. Echogenicity within normal limits. No mass or hydronephrosis visualized.  Left Kidney: Length: 11.2 cm. Echogenicity within normal limits. No mass or hydronephrosis visualized. Abdominal aorta: No aneurysm visualized. Other findings: None. IMPRESSION: 1. Mild splenomegaly. 2. Increased echotexture of the liver which may represent hepatic steatosis. Electronically Signed   By: Sherian Rein M.D.   On: 10/06/2022 08:26

## 2022-12-20 NOTE — Assessment & Plan Note (Signed)
Pending above work up 

## 2022-12-20 NOTE — Assessment & Plan Note (Addendum)
Labs are reviewed and discussed with patient.  Normal immature platelet fraction, normal LDH;normal folate, normal Vitamin B12, normal hepatitis, normal HIV, negative flowcytometry and no M protein. monoclonal gammopathy workup.   Thrombocytopenia could be due to mild splenomegaly, however, platelet count has rapidly decreased. Drug induced thrombocytopenia is another possibility however platelet count continues to decrease despite stop of the medication.  Also ANC has decreased.  Recommend bone marrow biopsy. She agrees.

## 2022-12-21 NOTE — Telephone Encounter (Signed)
Called pt and Left VM x2 to call back and let us know if 11/20 bx date will work for her

## 2022-12-22 NOTE — Telephone Encounter (Signed)
Pt sheduled for BmBx on 11/25 @8 :30, arrive 7;30a.   Please schedule MD on 12/2 @2 :30p.   Pt aware of appt details.

## 2022-12-23 ENCOUNTER — Encounter: Payer: Self-pay | Admitting: Oncology

## 2022-12-29 ENCOUNTER — Ambulatory Visit: Payer: Medicare HMO | Admitting: Radiology

## 2022-12-31 ENCOUNTER — Other Ambulatory Visit: Payer: Self-pay | Admitting: Physician Assistant

## 2022-12-31 DIAGNOSIS — Z01818 Encounter for other preprocedural examination: Secondary | ICD-10-CM

## 2022-12-31 NOTE — Progress Notes (Signed)
Patient for IR Bone Marrow Biopsy on Monday 01/03/2023, I called and spoke with the patient on the phone and gave pre-procedure instructions. Pt was made aware to be here at 7:30a, NPO after MN prior to procedure as well as driver post procedure/recovery/discharge. Pt stated understanding.  Called 12/31/2022

## 2023-01-02 NOTE — H&P (Signed)
Chief Complaint: Thrombocytopenia, neutropenia and borderline splenomegaly. Request is for bone marrow biopsy for further evaluation of possible drug induced thrombocytopenia or possible malignancy.  Referring Physician(s): Yu,Zhou  Supervising Physician: Ruel Favors  Patient Status: [facility] - Out-pt  History of Present Illness: Heather Haley is a 62 y.o. female  outpatient. History of  CKD, chronic pain, DM, GERD, HLD, HTN, IBS, Parkinson's Schizophrenia. Found to have thrombocytopenia, neutropenia and borderline splenomegaly. Team is requesting a bone marrow biopsy for further evaluation of possible drug induced thrombocytopenia or possible malignancy.  Patient alert and laying in bed,calm. Endorses left knee pain that she states is chronic in nature.. Denies any fevers, headache, chest pain, SOB, cough, abdominal pain, nausea, vomiting or bleeding.   CBC pending. Patient is on 81 mg of ASA. All other labs and medications are within acceptable parameters See list for pertinent allergies. Patient has been NPO since midnight.   Return precautions and treatment recommendations and follow-up discussed with the patient  who is agreeable with the plan.    Past Medical History:  Diagnosis Date   Anxiety    Asthma    Chronic kidney disease    Chronic pain    previously saw Dr. Shireen Quan in pain clinic, then saw pain specialist in [facility]   Depression    Diabetes mellitus (HCC)    Frequency of urination    GERD (gastroesophageal reflux disease)    Headache(784.0)    High cholesterol    History of kidney stones    Hypertension    IBS (irritable bowel syndrome)    Left ankle instability    Left knee DJD    Lumbar Degenerative Disc Disease of  10/11/2014   Neuromuscular disorder (HCC)    Osteoarthritis of hip (Right) 05/05/2015   Other enthesopathy of ankle and tarsus 12/15/2009   Qualifier: Diagnosis of  By: Darrick Penna MD, KARL     Parkinson's disease (HCC)    Peripheral  sensory neuropathy (Bilateral) 11/19/2014   Postoperative nausea and vomiting    Schizophrenia (HCC)     Past Surgical History:  Procedure Laterality Date   ABDOMINAL HYSTERECTOMY     ANKLE SURGERY Left    x 2   APPENDECTOMY     CARDIAC CATHETERIZATION     COLONOSCOPY  2013   COLONOSCOPY WITH PROPOFOL N/A 04/25/2017   Procedure: COLONOSCOPY WITH PROPOFOL;  Surgeon: Scot Jun, MD;  Location: [facility] ENDOSCOPY;  Service: Endoscopy;  Laterality: N/A;   COLONOSCOPY WITH PROPOFOL N/A 05/25/2022   Procedure: COLONOSCOPY WITH PROPOFOL;  Surgeon: [name], MD;  Location: [facility] ENDOSCOPY;  Service: Endoscopy;  Laterality: N/A;   CYSTOSCOPY/URETEROSCOPY/HOLMIUM LASER/STENT PLACEMENT Left 09/11/2019   Procedure: CYSTOSCOPY/URETEROSCOPY/HOLMIUM LASER/STENT PLACEMENT;  Surgeon: Riki Altes, MD;  Location: [facility] ORS;  Service: Urology;  Laterality: Left;   ESOPHAGOGASTRODUODENOSCOPY (EGD) WITH PROPOFOL N/A 10/03/2014   Procedure: ESOPHAGOGASTRODUODENOSCOPY (EGD) WITH PROPOFOL;  Surgeon: Elnita Maxwell, MD;  Location: [facility] ENDOSCOPY;  Service: Endoscopy;  Laterality: N/A;   ESOPHAGOGASTRODUODENOSCOPY (EGD) WITH PROPOFOL  04/25/2017   Procedure: ESOPHAGOGASTRODUODENOSCOPY (EGD) WITH PROPOFOL;  Surgeon: Scot Jun, MD;  Location: [facility] ENDOSCOPY;  Service: Endoscopy;;   ESOPHAGOGASTRODUODENOSCOPY (EGD) WITH PROPOFOL N/A 05/25/2022   Procedure: ESOPHAGOGASTRODUODENOSCOPY (EGD) WITH PROPOFOL;  Surgeon: [name], MD;  Location: [facility] ENDOSCOPY;  Service: Endoscopy;  Laterality: N/A;   JOINT REPLACEMENT Left    knee   KNEE ARTHROSCOPY  1997   left knee   LEFT HEART CATH AND CORONARY ANGIOGRAPHY Left 11/10/2017  Procedure: LEFT HEART CATH AND CORONARY ANGIOGRAPHY;  Surgeon: Marcina Millard, MD;  Location: [facility] INVASIVE CV LAB;  Service: Cardiovascular;  Laterality: Left;   LEFT HEART CATHETERIZATION WITH CORONARY ANGIOGRAM N/A 01/11/2013   Procedure: LEFT HEART  CATHETERIZATION WITH CORONARY ANGIOGRAM;  Surgeon: [name], MD;  Location: [facility] CATH LAB;  Service: Cardiovascular;  Laterality: N/A;   TOTAL KNEE ARTHROPLASTY  08/30/2011   Procedure: TOTAL KNEE ARTHROPLASTY;  Surgeon: Nilda Simmer, MD;  Location: [facility] OR;  Service: Orthopedics;  Laterality: Left;    Allergies: Prolixin decanoate [fluphenazine], Benztropine, Benztropine mesylate, Buprenorphine hcl, Carbidopa-levodopa, Codeine, Cymbalta [duloxetine hcl], Gabapentin, Lyrica [pregabalin], Morphine and codeine, Nortripytline hcl [nortriptyline], Nsaids, Other, Penicillins, Risperidone and related, Wellbutrin [bupropion], and Tolmetin  Medications: Prior to Admission medications   Medication Sig Start Date End Date Taking? Authorizing Provider  ACCU-CHEK GUIDE test strip  10/01/19   [provider]  acetaminophen (TYLENOL) 500 MG tablet Take 1,000 mg by mouth every 6 (six) hours as needed for moderate pain or headache.  Patient not taking: Reported on 11/22/2022    [provider]  albuterol (PROVENTIL HFA;VENTOLIN HFA) 108 (90 Base) MCG/ACT inhaler Inhale 2 puffs into the lungs every 6 (six) hours as needed for wheezing or shortness of breath. Patient not taking: Reported on 11/22/2022    [provider]  Alcohol Swabs (B-D SINGLE USE SWABS REGULAR) PADS  05/29/18   [provider]  amitriptyline (ELAVIL) 50 MG tablet TAKE 1 AND 1/2 TABLETS AT BEDTIME 06/03/22   Jomarie Longs, MD  aspirin EC 81 MG tablet Take 81 mg by mouth daily. Swallow whole.    [provider]  B-D ULTRAFINE III SHORT PEN 31G X 8 MM MISC Inject into the skin. Patient not taking: Reported on 11/22/2022 03/06/21   [provider]  Blood Glucose Calibration (ACCU-CHEK AVIVA) SOLN  05/29/18   [provider]  Blood Glucose Monitoring Suppl (ACCU-CHEK AVIVA PLUS) w/Device KIT  05/29/18   [provider]  buprenorphine (BUTRANS) 20 MCG/HR PTWK Place onto  the skin. Patient not taking: Reported on 11/22/2022    [provider]  butalbital-acetaminophen-caffeine (FIORICET) [phone] MG tablet  11/03/20   [provider]  carbidopa-levodopa (SINEMET CR) 50-200 MG tablet Take 1 tablet by mouth at bedtime.    [provider]  carbidopa-levodopa (SINEMET IR) 25-100 MG tablet Take 1 tablet by mouth 3 (three) times daily. 11/10/21   [provider]  Cholecalciferol (VITAMIN D-3) 125 MCG (5000 UT) TABS Take 1,000 Units by mouth daily.    [provider]  furosemide (LASIX) 40 MG tablet Take 40 mg by mouth as directed. Takes every other day Patient not taking: Reported on 11/22/2022    [provider]  insulin aspart (FIASP FLEXTOUCH) 100 UNIT/ML FlexTouch Pen Inject 10 Units into the skin daily in the afternoon. Takes 10 units at 10:00 am, 16 units at 4 pm, 10 units at 10 pm.    [provider]  insulin degludec (TRESIBA FLEXTOUCH) 100 UNIT/ML FlexTouch Pen Inject 38 Units into the skin daily. At 10:00 pm    [provider]  levothyroxine (SYNTHROID) 50 MCG tablet Take by mouth. 01/10/20 11/22/22  [provider]  metFORMIN (GLUCOPHAGE) 500 MG tablet Take 1,000 mg by mouth daily with breakfast. 08/19/20   [provider]  metoprolol tartrate (LOPRESSOR) 25 MG tablet Take 25 mg by mouth 2 (two) times daily.    [provider]  nitroGLYCERIN (NITRODUR - DOSED  IN MG/24 HR) 0.4 mg/hr patch Place 0.4 mg onto the skin as needed. Patient not taking: Reported on 11/22/2022    [provider]  OLANZapine (ZYPREXA) 5 MG tablet Take 0.5 tablets (2.5 mg total) by mouth at bedtime. 03/16/22   Jomarie Longs, MD  Omega-3 Fatty Acids (FISH OIL) 1000 MG CAPS Take by mouth.    [provider]  omeprazole (PRILOSEC) 40 MG capsule     [provider]  ondansetron (ZOFRAN) 4 MG tablet Take 4 mg by mouth as needed for nausea or vomiting. Patient not taking:  Reported on 11/22/2022    [provider]  oxyCODONE (OXY IR/ROXICODONE) 5 MG immediate release tablet Take 5-10 mg by mouth daily as needed. 08/26/22   [provider]  pravastatin (PRAVACHOL) 40 MG tablet Take by mouth. 08/19/20   [provider]  rizatriptan (MAXALT) 10 MG tablet Take 10 mg by mouth 2 (two) times daily as needed for migraine. May repeat in 2 hours if needed Patient not taking: Reported on 11/22/2022    [provider]  rOPINIRole (REQUIP) 0.5 MG tablet Take 0.5 mg by mouth 3 (three) times daily.    [provider]  torsemide (DEMADEX) 20 MG tablet Take 20 mg by mouth every other day.    [provider]     Family History  Problem Relation Age of Onset   Cancer Mother    Colon cancer Mother    Hypertension Father    Heart disease Father    Alcohol abuse Father    Breast cancer Sister     Social History   Socioeconomic History   Marital status: Divorced    Spouse name: Not on file   Number of children: 2   Years of education: 11   Highest education level: 11th grade  Occupational History    Employer: [employer]  Tobacco Use   Smoking status: Former    Current packs/day: 0.00    Average packs/day: 2.0 packs/day for 15.0 years (30.0 ttl pk-yrs)    Types: Cigarettes    Start date: 02/09/1980    Quit date: 02/09/1995    Years since quitting: 27.9   Smokeless tobacco: Never  Vaping Use   Vaping status: Never Used  Substance and Sexual Activity   Alcohol use: No    Alcohol/week: 0.0 standard drinks of alcohol   Drug use: No   Sexual activity: Not Currently  Other Topics Concern   Not on file  Social History Narrative   Patient lives at home alone. Patient works at [employer].   Caffeine daily- 2   Right handed.   Education- 11 th grade   Social Determinants of Health   Financial Resource Strain: Low Risk  (11/16/2022)   Received from [facility]   Overall Financial Resource  Strain (CARDIA)    Difficulty of Paying Living Expenses: Not hard at all  Food Insecurity: No Food Insecurity (11/22/2022)   Hunger Vital Sign    Worried About Running Out of Food in the Last Year: Never true    Ran Out of Food in the Last Year: Never true  Transportation Needs: No Transportation Needs (11/22/2022)   PRAPARE - Administrator, Civil Service (Medical): No    Lack of Transportation (Non-Medical): No  Physical Activity: Inactive (03/24/2017)   Exercise Vital Sign    Days of Exercise per Week: 0 days    Minutes of Exercise per Session: 0 min  Stress:  Stress Concern Present (03/24/2017)   [employer] of Occupational Health - Occupational Stress Questionnaire    Feeling of Stress : Rather much  Social Connections: Unknown (01/12/2022)   [facility] from [facility]   Social Network    Social Network: Not on file    Review of Systems: A 12 point ROS discussed and pertinent positives are indicated in the HPI above.  All other systems are negative.  Review of Systems  Constitutional:  Negative for fatigue and fever.  HENT:  Negative for congestion.   Respiratory:  Negative for cough and shortness of breath.   Gastrointestinal:  Negative for abdominal pain, diarrhea, nausea and vomiting.  Musculoskeletal:  Positive for arthralgias (left knee pain).    Vital Signs: BP (!) 152/76   Pulse 78   Temp 98.1 F (36.7 C)   Resp 18   Ht 5\' 5"  (1.651 m)   SpO2 94%   BMI 39.95 kg/m     Physical Exam Vitals and nursing note reviewed.  Constitutional:      Appearance: She is well-developed. She is obese.  HENT:     Head: Normocephalic and atraumatic.     Mouth/Throat:     Mouth: Mucous membranes are dry.  Eyes:     Conjunctiva/sclera: Conjunctivae normal.  Cardiovascular:     Rate and Rhythm: Normal rate.  Pulmonary:     Effort: Pulmonary effort is normal.  Musculoskeletal:        General: Normal range of motion.     Cervical back:  Normal range of motion.  Skin:    General: Skin is warm and dry.     Coloration: Skin is pale.  Neurological:     General: No focal deficit present.     Mental Status: She is alert and oriented to person, place, and time.  Psychiatric:        Mood and Affect: Mood normal.        Thought Content: Thought content normal.        Judgment: Judgment normal.     Imaging: No results found.  Labs:  CBC: Recent Labs    09/25/22 1309 11/22/22 1528  WBC  --  2.6*  HGB  --  11.9*  HCT  --  35.9*  PLT 137* 91*    COAGS: No results for input(s): "INR", "APTT" in the last 8760 hours.  BMP: Recent Labs    03/01/22 0951 07/09/22 1348 09/25/22 1309  NA  --   --  138  CREATININE 0.90 0.90  --     LIVER FUNCTION TESTS: Recent Labs    09/25/22 1309  BILITOT 0.8  AST 36  ALT 14  ALKPHOS 74  PROT 7.8  ALBUMIN 3.8      Assessment and Plan:  62 y.o female outpatient. History of  CKD, chronic pain, DM, GERD, HLD, HTN, IBS, Parkinson's Schizophrenia. Found to have thrombocytopenia, neutropenia and borderline splenomegaly. Team is requesting a bone marrow biopsy for further evaluation of possible drug induced thrombocytopenia or possible malignancy.   Risks and benefits of bone marrow biopsy was discussed with the patient and/or patient's family including, but not limited to bleeding, infection, damage to adjacent structures or low yield requiring additional tests.  All of the questions were answered and there is agreement to proceed.  Consent signed and in chart.   Thank you for this interesting consult.  I greatly enjoyed meeting ASMARA LENG and look forward to participating in their care.  A  copy of this report was sent to the requesting provider on this date.  Electronically Signed: Alene Mires, NP 01/03/2023, 7:53 AM   I spent a total of  30 Minutes   in face to face in clinical consultation, greater than 50% of which was counseling/coordinating  care for bone marrow biopsy

## 2023-01-03 ENCOUNTER — Ambulatory Visit
Admission: RE | Admit: 2023-01-03 | Discharge: 2023-01-03 | Disposition: A | Discharge status: Home / Self Care | Payer: Medicare HMO | Source: Ambulatory Visit | Attending: Oncology | Admitting: Oncology

## 2023-01-03 ENCOUNTER — Encounter: Payer: Self-pay | Admitting: Radiology

## 2023-01-03 ENCOUNTER — Other Ambulatory Visit: Payer: Self-pay

## 2023-01-03 DIAGNOSIS — D46Z Other myelodysplastic syndromes: Secondary | ICD-10-CM | POA: Diagnosis not present

## 2023-01-03 DIAGNOSIS — N189 Chronic kidney disease, unspecified: Secondary | ICD-10-CM | POA: Diagnosis not present

## 2023-01-03 DIAGNOSIS — I129 Hypertensive chronic kidney disease with stage 1 through stage 4 chronic kidney disease, or unspecified chronic kidney disease: Secondary | ICD-10-CM | POA: Insufficient documentation

## 2023-01-03 DIAGNOSIS — Z1379 Encounter for other screening for genetic and chromosomal anomalies: Secondary | ICD-10-CM | POA: Insufficient documentation

## 2023-01-03 DIAGNOSIS — D61818 Other pancytopenia: Secondary | ICD-10-CM | POA: Insufficient documentation

## 2023-01-03 DIAGNOSIS — E1122 Type 2 diabetes mellitus with diabetic chronic kidney disease: Secondary | ICD-10-CM | POA: Insufficient documentation

## 2023-01-03 DIAGNOSIS — F209 Schizophrenia, unspecified: Secondary | ICD-10-CM | POA: Insufficient documentation

## 2023-01-03 DIAGNOSIS — M25562 Pain in left knee: Secondary | ICD-10-CM | POA: Insufficient documentation

## 2023-01-03 DIAGNOSIS — Z7984 Long term (current) use of oral hypoglycemic drugs: Secondary | ICD-10-CM | POA: Insufficient documentation

## 2023-01-03 DIAGNOSIS — D709 Neutropenia, unspecified: Secondary | ICD-10-CM | POA: Diagnosis not present

## 2023-01-03 DIAGNOSIS — K589 Irritable bowel syndrome without diarrhea: Secondary | ICD-10-CM | POA: Insufficient documentation

## 2023-01-03 DIAGNOSIS — Z01818 Encounter for other preprocedural examination: Secondary | ICD-10-CM

## 2023-01-03 DIAGNOSIS — Z7982 Long term (current) use of aspirin: Secondary | ICD-10-CM | POA: Diagnosis not present

## 2023-01-03 DIAGNOSIS — K219 Gastro-esophageal reflux disease without esophagitis: Secondary | ICD-10-CM | POA: Diagnosis not present

## 2023-01-03 DIAGNOSIS — D696 Thrombocytopenia, unspecified: Secondary | ICD-10-CM | POA: Diagnosis not present

## 2023-01-03 DIAGNOSIS — Z794 Long term (current) use of insulin: Secondary | ICD-10-CM | POA: Insufficient documentation

## 2023-01-03 DIAGNOSIS — R161 Splenomegaly, not elsewhere classified: Secondary | ICD-10-CM | POA: Insufficient documentation

## 2023-01-03 DIAGNOSIS — Z87891 Personal history of nicotine dependence: Secondary | ICD-10-CM | POA: Insufficient documentation

## 2023-01-03 DIAGNOSIS — G8929 Other chronic pain: Secondary | ICD-10-CM | POA: Insufficient documentation

## 2023-01-03 HISTORY — PX: IR BONE MARROW BIOPSY & ASPIRATION: IMG5727

## 2023-01-03 LAB — CBC WITH DIFFERENTIAL/PLATELET
Abs Immature Granulocytes: 0.05 10*3/uL (ref 0.00–0.07)
Basophils Absolute: 0 10*3/uL (ref 0.0–0.1)
Basophils Relative: 1 %
Eosinophils Absolute: 0 10*3/uL (ref 0.0–0.5)
Eosinophils Relative: 0 %
HCT: 32.1 % — ABNORMAL LOW (ref 36.0–46.0)
Hemoglobin: 10.5 g/dL — ABNORMAL LOW (ref 12.0–15.0)
Immature Granulocytes: 1 %
Lymphocytes Relative: 50 %
Lymphs Abs: 1.8 10*3/uL (ref 0.7–4.0)
MCH: 32.3 pg (ref 26.0–34.0)
MCHC: 32.7 g/dL (ref 30.0–36.0)
MCV: 98.8 fL (ref 80.0–100.0)
Monocytes Absolute: 0 10*3/uL — ABNORMAL LOW (ref 0.1–1.0)
Monocytes Relative: 1 %
Neutro Abs: 1.7 10*3/uL (ref 1.7–7.7)
Neutrophils Relative %: 47 %
Platelets: 72 10*3/uL — ABNORMAL LOW (ref 150–400)
RBC: 3.25 MIL/uL — ABNORMAL LOW (ref 3.87–5.11)
RDW: 16.7 % — ABNORMAL HIGH (ref 11.5–15.5)
Smear Review: NORMAL
WBC: 3.6 10*3/uL — ABNORMAL LOW (ref 4.0–10.5)
nRBC: 3.1 % — ABNORMAL HIGH (ref 0.0–0.2)

## 2023-01-03 LAB — PROTIME-INR
INR: 1.1 (ref 0.8–1.2)
Prothrombin Time: 14.3 s (ref 11.4–15.2)

## 2023-01-03 LAB — GLUCOSE, CAPILLARY: Glucose-Capillary: 148 mg/dL — ABNORMAL HIGH (ref 70–99)

## 2023-01-03 MED ORDER — MIDAZOLAM HCL 2 MG/2ML IJ SOLN
INTRAMUSCULAR | Status: AC | PRN
  Administered 2023-01-03: 1 mg via INTRAVENOUS

## 2023-01-03 MED ORDER — MIDAZOLAM HCL 2 MG/2ML IJ SOLN
INTRAMUSCULAR | Status: AC
Start: 2023-01-03 — End: ?
  Filled 2023-01-03: qty 2

## 2023-01-03 MED ORDER — FENTANYL CITRATE (PF) 100 MCG/2ML IJ SOLN
INTRAMUSCULAR | Status: AC
  Filled 2023-01-03: qty 2

## 2023-01-03 MED ORDER — HEPARIN SOD (PORK) LOCK FLUSH 100 UNIT/ML IV SOLN
INTRAVENOUS | Status: AC
  Filled 2023-01-03: qty 5

## 2023-01-03 MED ORDER — SODIUM CHLORIDE 0.9 % IV SOLN: INTRAVENOUS | Status: DC

## 2023-01-03 MED ORDER — LIDOCAINE 1 % OPTIME INJ - NO CHARGE
8.0000 mL | Freq: Once | INTRAMUSCULAR | Status: DC
  Filled 2023-01-03: qty 8

## 2023-01-03 MED ORDER — FENTANYL CITRATE (PF) 100 MCG/2ML IJ SOLN
INTRAMUSCULAR | Status: AC | PRN
  Administered 2023-01-03: 25 ug via INTRAVENOUS
  Administered 2023-01-03: 50 ug via INTRAVENOUS

## 2023-01-03 NOTE — Procedures (Signed)
Interventional Radiology Procedure Note  Procedure: IR FLUORO RT ILIAC BM ASP AND CORE    Complications: None  Estimated Blood Loss:  MIN  Findings: 11 G ASP AND CORE    Sharen Counter, MD

## 2023-01-04 DIAGNOSIS — I25118 Atherosclerotic heart disease of native coronary artery with other forms of angina pectoris: Secondary | ICD-10-CM | POA: Diagnosis not present

## 2023-01-04 DIAGNOSIS — R42 Dizziness and giddiness: Secondary | ICD-10-CM | POA: Diagnosis not present

## 2023-01-04 DIAGNOSIS — R079 Chest pain, unspecified: Secondary | ICD-10-CM | POA: Diagnosis not present

## 2023-01-04 DIAGNOSIS — R002 Palpitations: Secondary | ICD-10-CM | POA: Diagnosis not present

## 2023-01-04 DIAGNOSIS — E7800 Pure hypercholesterolemia, unspecified: Secondary | ICD-10-CM | POA: Diagnosis not present

## 2023-01-04 DIAGNOSIS — I10 Essential (primary) hypertension: Secondary | ICD-10-CM | POA: Diagnosis not present

## 2023-01-05 ENCOUNTER — Ambulatory Visit: Payer: Medicare HMO | Admitting: Oncology

## 2023-01-10 ENCOUNTER — Encounter: Payer: Self-pay | Admitting: Oncology

## 2023-01-10 ENCOUNTER — Telehealth: Payer: Self-pay

## 2023-01-10 ENCOUNTER — Inpatient Hospital Stay: Payer: Medicare HMO | Attending: Oncology | Admitting: Oncology

## 2023-01-10 VITALS — BP 138/76 | HR 111 | Temp 96.4°F | Resp 18 | Wt 240.6 lb

## 2023-01-10 DIAGNOSIS — D709 Neutropenia, unspecified: Secondary | ICD-10-CM | POA: Diagnosis not present

## 2023-01-10 DIAGNOSIS — Z87891 Personal history of nicotine dependence: Secondary | ICD-10-CM | POA: Insufficient documentation

## 2023-01-10 DIAGNOSIS — Z885 Allergy status to narcotic agent status: Secondary | ICD-10-CM | POA: Diagnosis not present

## 2023-01-10 DIAGNOSIS — Z9049 Acquired absence of other specified parts of digestive tract: Secondary | ICD-10-CM | POA: Insufficient documentation

## 2023-01-10 DIAGNOSIS — Z803 Family history of malignant neoplasm of breast: Secondary | ICD-10-CM | POA: Insufficient documentation

## 2023-01-10 DIAGNOSIS — N182 Chronic kidney disease, stage 2 (mild): Secondary | ICD-10-CM | POA: Insufficient documentation

## 2023-01-10 DIAGNOSIS — G20A1 Parkinson's disease without dyskinesia, without mention of fluctuations: Secondary | ICD-10-CM | POA: Insufficient documentation

## 2023-01-10 DIAGNOSIS — Z8249 Family history of ischemic heart disease and other diseases of the circulatory system: Secondary | ICD-10-CM | POA: Diagnosis not present

## 2023-01-10 DIAGNOSIS — D696 Thrombocytopenia, unspecified: Secondary | ICD-10-CM | POA: Diagnosis not present

## 2023-01-10 DIAGNOSIS — Z87442 Personal history of urinary calculi: Secondary | ICD-10-CM | POA: Insufficient documentation

## 2023-01-10 DIAGNOSIS — D649 Anemia, unspecified: Secondary | ICD-10-CM | POA: Insufficient documentation

## 2023-01-10 DIAGNOSIS — Z811 Family history of alcohol abuse and dependence: Secondary | ICD-10-CM | POA: Insufficient documentation

## 2023-01-10 DIAGNOSIS — R5383 Other fatigue: Secondary | ICD-10-CM | POA: Diagnosis not present

## 2023-01-10 DIAGNOSIS — Z888 Allergy status to other drugs, medicaments and biological substances status: Secondary | ICD-10-CM | POA: Diagnosis not present

## 2023-01-10 DIAGNOSIS — Z809 Family history of malignant neoplasm, unspecified: Secondary | ICD-10-CM | POA: Diagnosis not present

## 2023-01-10 DIAGNOSIS — Z79899 Other long term (current) drug therapy: Secondary | ICD-10-CM | POA: Insufficient documentation

## 2023-01-10 DIAGNOSIS — K760 Fatty (change of) liver, not elsewhere classified: Secondary | ICD-10-CM | POA: Diagnosis not present

## 2023-01-10 DIAGNOSIS — Z886 Allergy status to analgesic agent status: Secondary | ICD-10-CM | POA: Insufficient documentation

## 2023-01-10 DIAGNOSIS — Z800 Family history of malignant neoplasm of digestive organs: Secondary | ICD-10-CM | POA: Insufficient documentation

## 2023-01-10 DIAGNOSIS — E7800 Pure hypercholesterolemia, unspecified: Secondary | ICD-10-CM | POA: Insufficient documentation

## 2023-01-10 DIAGNOSIS — Z880 Allergy status to penicillin: Secondary | ICD-10-CM | POA: Insufficient documentation

## 2023-01-10 DIAGNOSIS — D469 Myelodysplastic syndrome, unspecified: Secondary | ICD-10-CM | POA: Diagnosis not present

## 2023-01-10 DIAGNOSIS — Z90710 Acquired absence of both cervix and uterus: Secondary | ICD-10-CM | POA: Diagnosis not present

## 2023-01-10 LAB — SURGICAL PATHOLOGY

## 2023-01-10 NOTE — Assessment & Plan Note (Addendum)
Labs are reviewed and discussed with patient.  Normal immature platelet fraction, normal LDH;normal folate, normal Vitamin B12, normal hepatitis, normal HIV, negative flowcytometry and no M protein. monoclonal gammopathy workup.   Bone marrow biopsy showed - Hypercellular bone marrow (80%) with erythroid predominant trilineage hematopoiesis, increased blasts 9% and multilineage dysplasia. Consistent with MDS-IB1  Cytogenetics and NGS are both pending. IPSS M risk group pending, at least intermediate risk group Management plan TBD   Addendum cytogenetics is positive for  t(6;9)(p22;q34)/[id number]. Will refer patient to [facility] for further evaluation.

## 2023-01-10 NOTE — Assessment & Plan Note (Signed)
Du to MDS. Pending work up

## 2023-01-10 NOTE — Assessment & Plan Note (Signed)
Pending above work up 

## 2023-01-10 NOTE — Progress Notes (Addendum)
Hematology/Oncology Progress note [phone] [phone]       REFERRING PROVIDER: Barbette Reichmann, MD  CHIEF COMPLAINTS/REASON FOR VISIT:  Thrombocytopenia, neutropenia/ MDS  ASSESSMENT & PLAN:   MDS (myelodysplastic syndrome) (HCC) Labs are reviewed and discussed with patient.  Normal immature platelet fraction, normal LDH;normal folate, normal Vitamin B12, normal hepatitis, normal HIV, negative flowcytometry and no M protein. monoclonal gammopathy workup.   Bone marrow biopsy showed - Hypercellular bone marrow (80%) with erythroid predominant trilineage hematopoiesis, increased blasts 9% and multilineage dysplasia. Consistent with MDS-IB1  Cytogenetics and NGS are both pending. IPSS M risk group pending, at least intermediate risk group Management plan TBD   Addendum cytogenetics is positive for  t(6;9)(p22;q34)/[id number]. Will refer patient to [facility] for further evaluation.     Neutropenia (HCC) Pending above work up  Normocytic anemia Likely due to MDS. Pending work up  Thrombocytopenia (HCC) Du to MDS. Pending work up   No orders of the defined types were placed in this encounter.  Follow up after bone marrow biopsy All questions were answered. The patient knows to call the clinic with any problems, questions or concerns.  Rickard Patience, MD, PhD [facility] Hematology Oncology 01/10/2023   HISTORY OF PRESENTING ILLNESS:  Heather Haley is a 62 y.o. female who was seen in consultation at the request of Barbette Reichmann, MD for evaluation of thrombocytopenia  The decrease in platelet count was first noted in August, with a drop from 165 to 137, and has continued to decline. The patient denies any new medications and reports no alcohol use. She takes Tylenol, fish oil, B12, and Vitamin D as over-the-counter supplements.  The patient's recent ultrasound revealed a borderline increase in spleen size and confirmed the  presence of fatty liver disease. The patient has been experiencing fatigue and excessive sleep, but denies any unintentional weight loss, night sweats, or fever. Her weight has been stable  Denies hematochezia, hematuria, hematemesis, epistaxis, black tarry stool.  Patient has no easy bruising.     Denies history hepatitis or HIV infection Denies history of chronic liver disease Denies routine alcohol consumption. Denies dietary restrictions.  Denies herbal medication  The patient has not previously been diagnosed with EBV virus infection or mono.   INTERVAL HISTORY Heather Haley is a 63 y.o. female who has above history reviewed by me today presents for follow up visit for thrombocytopenia. 01/03/2023 bone marrow biopsy  Bone marrow biopsy showed - Hypercellular bone marrow (80%) with erythroid predominant trilineage hematopoiesis, increased blasts [5-10% by CD34 IHC, and 9% by flow cytometric analysis and multilineage dysplasia. Consistent with MDS-IB1   MEDICAL HISTORY:  Past Medical History:  Diagnosis Date   Anxiety    Asthma    Chronic kidney disease    Chronic pain    previously saw Dr. Shireen Quan in pain clinic, then saw pain specialist in [facility]   Depression    Diabetes mellitus (HCC)    Frequency of urination    GERD (gastroesophageal reflux disease)    Headache(784.0)    High cholesterol    History of kidney stones    Hypertension    IBS (irritable bowel syndrome)    Left ankle instability    Left knee DJD    Lumbar Degenerative Disc Disease of  10/11/2014   Neuromuscular disorder (HCC)    Osteoarthritis of hip (Right) 05/05/2015   Other enthesopathy of ankle and tarsus 12/15/2009   Qualifier: Diagnosis of  By: Darrick Penna MD, [place]  Parkinson's disease (HCC)    Peripheral sensory neuropathy (Bilateral) 11/19/2014   Postoperative nausea and vomiting    Schizophrenia (HCC)     SURGICAL HISTORY: Past Surgical History:  Procedure Laterality Date   ABDOMINAL  HYSTERECTOMY     ANKLE SURGERY Left    x 2   APPENDECTOMY     CARDIAC CATHETERIZATION     COLONOSCOPY  2013   COLONOSCOPY WITH PROPOFOL N/A 04/25/2017   Procedure: COLONOSCOPY WITH PROPOFOL;  Surgeon: Scot Jun, MD;  Location: [facility] ENDOSCOPY;  Service: Endoscopy;  Laterality: N/A;   COLONOSCOPY WITH PROPOFOL N/A 05/25/2022   Procedure: COLONOSCOPY WITH PROPOFOL;  Surgeon: [name], MD;  Location: [facility] ENDOSCOPY;  Service: Endoscopy;  Laterality: N/A;   CYSTOSCOPY/URETEROSCOPY/HOLMIUM LASER/STENT PLACEMENT Left 09/11/2019   Procedure: CYSTOSCOPY/URETEROSCOPY/HOLMIUM LASER/STENT PLACEMENT;  Surgeon: Riki Altes, MD;  Location: [facility] ORS;  Service: Urology;  Laterality: Left;   ESOPHAGOGASTRODUODENOSCOPY (EGD) WITH PROPOFOL N/A 10/03/2014   Procedure: ESOPHAGOGASTRODUODENOSCOPY (EGD) WITH PROPOFOL;  Surgeon: Elnita Maxwell, MD;  Location: [facility] ENDOSCOPY;  Service: Endoscopy;  Laterality: N/A;   ESOPHAGOGASTRODUODENOSCOPY (EGD) WITH PROPOFOL  04/25/2017   Procedure: ESOPHAGOGASTRODUODENOSCOPY (EGD) WITH PROPOFOL;  Surgeon: Scot Jun, MD;  Location: [facility] ENDOSCOPY;  Service: Endoscopy;;   ESOPHAGOGASTRODUODENOSCOPY (EGD) WITH PROPOFOL N/A 05/25/2022   Procedure: ESOPHAGOGASTRODUODENOSCOPY (EGD) WITH PROPOFOL;  Surgeon: [name], MD;  Location: [facility] ENDOSCOPY;  Service: Endoscopy;  Laterality: N/A;   IR BONE MARROW BIOPSY & ASPIRATION  01/03/2023   JOINT REPLACEMENT Left    knee   KNEE ARTHROSCOPY  1997   left knee   LEFT HEART CATH AND CORONARY ANGIOGRAPHY Left 11/10/2017   Procedure: LEFT HEART CATH AND CORONARY ANGIOGRAPHY;  Surgeon: Marcina Millard, MD;  Location: [facility] INVASIVE CV LAB;  Service: Cardiovascular;  Laterality: Left;   LEFT HEART CATHETERIZATION WITH CORONARY ANGIOGRAM N/A 01/11/2013   Procedure: LEFT HEART CATHETERIZATION WITH CORONARY ANGIOGRAM;  Surgeon: [name], MD;  Location: [facility] CATH LAB;  Service: Cardiovascular;   Laterality: N/A;   TOTAL KNEE ARTHROPLASTY  08/30/2011   Procedure: TOTAL KNEE ARTHROPLASTY;  Surgeon: Nilda Simmer, MD;  Location: [facility] OR;  Service: Orthopedics;  Laterality: Left;    SOCIAL HISTORY: Social History   Socioeconomic History   Marital status: Divorced    Spouse name: Not on file   Number of children: 2   Years of education: 11   Highest education level: 11th grade  Occupational History    Employer: [employer]  Tobacco Use   Smoking status: Former    Current packs/day: 0.00    Average packs/day: 2.0 packs/day for 15.0 years (30.0 ttl pk-yrs)    Types: Cigarettes    Start date: 02/09/1980    Quit date: 02/09/1995    Years since quitting: 27.9   Smokeless tobacco: Never  Vaping Use   Vaping status: Never Used  Substance and Sexual Activity   Alcohol use: No    Alcohol/week: 0.0 standard drinks of alcohol   Drug use: No   Sexual activity: Not Currently  Other Topics Concern   Not on file  Social History Narrative   Patient lives at home alone. Patient works at [employer].   Caffeine daily- 2   Right handed.   Education- 11 th grade   Social Determinants of Health   Financial Resource Strain: Low Risk  (11/16/2022)   Received from [facility]   Overall Financial Resource Strain (CARDIA)    Difficulty of Paying  Living Expenses: Not hard at all  Food Insecurity: No Food Insecurity (11/22/2022)   Hunger Vital Sign    Worried About Running Out of Food in the Last Year: Never true    Ran Out of Food in the Last Year: Never true  Transportation Needs: No Transportation Needs (11/22/2022)   PRAPARE - Administrator, Civil Service (Medical): No    Lack of Transportation (Non-Medical): No  Physical Activity: Inactive (03/24/2017)   Exercise Vital Sign    Days of Exercise per Week: 0 days    Minutes of Exercise per Session: 0 min  Stress: Stress Concern Present (03/24/2017)   [employer] of Occupational Health -  Occupational Stress Questionnaire    Feeling of Stress : Rather much  Social Connections: Unknown (01/12/2022)   Received from [facility]   Social Network    Social Network: Not on file  Intimate Partner Violence: Not At Risk (11/22/2022)   Humiliation, Afraid, Rape, and Kick questionnaire    Fear of Current or Ex-Partner: No    Emotionally Abused: No    Physically Abused: No    Sexually Abused: No    FAMILY HISTORY: Family History  Problem Relation Age of Onset   Cancer Mother    Colon cancer Mother    Hypertension Father    Heart disease Father    Alcohol abuse Father    Breast cancer Sister     ALLERGIES:  is allergic to prolixin decanoate [fluphenazine], benztropine, benztropine mesylate, buprenorphine hcl, carbidopa-levodopa, codeine, cymbalta [duloxetine hcl], gabapentin, lyrica [pregabalin], morphine and codeine, nortripytline hcl [nortriptyline], nsaids, other, penicillins, risperidone and related, wellbutrin [bupropion], and tolmetin.  MEDICATIONS:  Current Outpatient Medications  Medication Sig Dispense Refill   amantadine (SYMMETREL) 100 MG capsule Take 75 mg by mouth once.     amitriptyline (ELAVIL) 50 MG tablet TAKE 1 AND 1/2 TABLETS AT BEDTIME 135 tablet 1   aspirin EC 81 MG tablet Take 81 mg by mouth daily. Swallow whole.     carbidopa-levodopa (SINEMET CR) 50-200 MG tablet Take 1 tablet by mouth at bedtime.     carbidopa-levodopa (SINEMET IR) 25-100 MG tablet Take 1 tablet by mouth 3 (three) times daily.     Cholecalciferol (VITAMIN D-3) 125 MCG (5000 UT) TABS Take 1,000 Units by mouth daily.     insulin aspart (FIASP FLEXTOUCH) 100 UNIT/ML FlexTouch Pen Inject 10 Units into the skin daily in the afternoon. Takes 10 units at 10:00 am, 16 units at 4 pm, 10 units at 10 pm.     insulin degludec (TRESIBA FLEXTOUCH) 100 UNIT/ML FlexTouch Pen Inject 38 Units into the skin daily. At 10:00 pm     levothyroxine (SYNTHROID) 50 MCG tablet Take by mouth.      metFORMIN (GLUCOPHAGE) 500 MG tablet Take 1,000 mg by mouth daily with breakfast.     metoprolol tartrate (LOPRESSOR) 25 MG tablet Take 25 mg by mouth 2 (two) times daily.     OLANZapine (ZYPREXA) 5 MG tablet Take 0.5 tablets (2.5 mg total) by mouth at bedtime. 90 tablet 3   Omega-3 Fatty Acids (FISH OIL) 1000 MG CAPS Take by mouth.     omeprazole (PRILOSEC) 40 MG capsule      oxyCODONE (OXY IR/ROXICODONE) 5 MG immediate release tablet Take 5-10 mg by mouth daily as needed.     pravastatin (PRAVACHOL) 40 MG tablet Take by mouth.     rOPINIRole (REQUIP) 0.5 MG tablet Take 0.5 mg by mouth 3 (three)  times daily.     SUMAtriptan (IMITREX) 100 MG tablet Take 100 mg by mouth every 2 (two) hours as needed for migraine. May repeat in 2 hours if headache persists or recurs.     torsemide (DEMADEX) 20 MG tablet Take 20 mg by mouth every other day.     ACCU-CHEK GUIDE test strip  (Patient not taking: Reported on 11/22/2022)     acetaminophen (TYLENOL) 500 MG tablet Take 1,000 mg by mouth every 6 (six) hours as needed for moderate pain or headache.  (Patient not taking: Reported on 11/22/2022)     albuterol (PROVENTIL HFA;VENTOLIN HFA) 108 (90 Base) MCG/ACT inhaler Inhale 2 puffs into the lungs every 6 (six) hours as needed for wheezing or shortness of breath. (Patient not taking: Reported on 11/22/2022)     Alcohol Swabs (B-D SINGLE USE SWABS REGULAR) PADS  (Patient not taking: Reported on 11/22/2022)     B-D ULTRAFINE III SHORT PEN 31G X 8 MM MISC Inject into the skin. (Patient not taking: Reported on 11/22/2022)     Blood Glucose Calibration (ACCU-CHEK AVIVA) SOLN  (Patient not taking: Reported on 11/22/2022)     Blood Glucose Monitoring Suppl (ACCU-CHEK AVIVA PLUS) w/Device KIT  (Patient not taking: Reported on 11/22/2022)     buprenorphine (BUTRANS) 20 MCG/HR PTWK Place onto the skin. (Patient not taking: Reported on 11/22/2022)     butalbital-acetaminophen-caffeine (FIORICET) 50-325-40 MG tablet   (Patient not taking: Reported on 11/22/2022)     furosemide (LASIX) 40 MG tablet Take 40 mg by mouth as directed. Takes every other day (Patient not taking: Reported on 11/22/2022)     nitroGLYCERIN (NITRODUR - DOSED IN MG/24 HR) 0.4 mg/hr patch Place 0.4 mg onto the skin as needed. (Patient not taking: Reported on 11/22/2022)     ondansetron (ZOFRAN) 4 MG tablet Take 4 mg by mouth as needed for nausea or vomiting. (Patient not taking: Reported on 11/22/2022)     rizatriptan (MAXALT) 10 MG tablet Take 10 mg by mouth 2 (two) times daily as needed for migraine. May repeat in 2 hours if needed (Patient not taking: Reported on 11/22/2022)     No current facility-administered medications for this visit.    Review of Systems  Constitutional:  Positive for fatigue. Negative for appetite change, chills and fever.  HENT:   Negative for hearing loss and voice change.   Eyes:  Negative for eye problems.  Respiratory:  Negative for chest tightness and cough.   Cardiovascular:  Negative for chest pain.  Gastrointestinal:  Negative for abdominal distention, abdominal pain and blood in stool.  Endocrine: Negative for hot flashes.  Genitourinary:  Negative for difficulty urinating and frequency.   Musculoskeletal:  Negative for arthralgias.  Skin:  Negative for itching and rash.  Neurological:  Negative for extremity weakness.  Hematological:  Negative for adenopathy.  Psychiatric/Behavioral:  Negative for confusion.     PHYSICAL EXAMINATION: ECOG PERFORMANCE STATUS: 1 - Symptomatic but completely ambulatory Vitals:   01/10/23 1417  BP: 138/76  Pulse: (!) 111  Resp: 18  Temp: (!) 96.4 F (35.8 C)  SpO2: 97%   Filed Weights   01/10/23 1417  Weight: 240 lb 9.6 oz (109.1 kg)    Physical Exam Constitutional:      General: She is not in acute distress.    Appearance: She is obese.  HENT:     Head: Normocephalic and atraumatic.  Eyes:     General: No scleral icterus. Cardiovascular:  Rate and Rhythm: Normal rate.  Pulmonary:     Effort: Pulmonary effort is normal. No respiratory distress.  Abdominal:     General: There is no distension.  Musculoskeletal:        General: No deformity. Normal range of motion.     Cervical back: Normal range of motion and neck supple.  Skin:    Findings: No rash.  Neurological:     Mental Status: She is alert and oriented to person, place, and time. Mental status is at baseline.  Psychiatric:        Mood and Affect: Mood normal.      LABORATORY DATA:  I have reviewed the data as listed    Latest Ref Rng & Units 01/03/2023    7:43 AM 11/22/2022    3:28 PM 09/25/2022    1:09 PM  CBC  WBC 4.0 - 10.5 K/uL 3.6  2.6    Hemoglobin 12.0 - 15.0 g/dL 16.1  09.6    Hematocrit 36.0 - 46.0 % 32.1  35.9    Platelets 150 - 400 K/uL 72  91  137       Latest Ref Rng & Units 09/25/2022    1:09 PM 07/09/2022    1:48 PM 03/01/2022    9:51 AM  CMP  Creatinine 0.44 - 1.00 mg/dL  0.45  4.09   Sodium 811 - 145 mmol/L 138     Total Protein 6.5 - 8.1 g/dL 7.8     Total Bilirubin 0.3 - 1.2 mg/dL 0.8     Alkaline Phos 38 - 126 U/L 74     AST 15 - 41 U/L 36     ALT 0 - 44 U/L 14        RADIOGRAPHIC STUDIES: I have personally reviewed the radiological images as listed and agreed with the findings in the report.  IR BONE MARROW BIOPSY & ASPIRATION  Result Date: 01/03/2023 INDICATION: Thrombocytopenia, neutropenia EXAM: FLUORO GUIDED RIGHT ILIAC BONE MARROW ASPIRATION AND CORE BIOPSY Date:  01/03/2023 01/03/2023 9:06 am Radiologist:  M. Ruel Favors, MD Guidance:  FLUOROSCOPY FLUOROSCOPY: Fluoroscopy Time:(59 mGy). MEDICATIONS: 1% lidocaine ANESTHESIA/SEDATION: 1.0 mg IV Versed; 75 mcg IV Fentanyl Moderate Sedation Time:  12 minutes The patient was continuously monitored during the procedure by the interventional radiology nurse under my direct supervision. CONTRAST:  None COMPLICATIONS: None PROCEDURE: Informed consent was obtained from the patient  following explanation of the procedure, risks, benefits and alternatives. The patient understands, agrees and consents for the procedure. All questions were addressed. A time out was performed. The patient was positioned prone and non-contrast localization CT was performed of the pelvis to demonstrate the iliac marrow spaces. Maximal barrier sterile technique utilized including caps, mask, sterile gowns, sterile gloves, large sterile drape, hand hygiene, and Betadine prep. Under sterile conditions and local anesthesia, an 11 gauge coaxial bone biopsy needle was advanced into the right iliac marrow space. Needle position was confirmed with CT imaging. Initially, bone marrow aspiration was performed. Next, the 11 gauge outer cannula was utilized to obtain a right iliac bone marrow core biopsy. Needle was removed. Hemostasis was obtained with compression. The patient tolerated the procedure well. Samples were prepared with the cytotechnologist. No immediate complications. IMPRESSION: CT guided right iliac bone marrow aspiration and core biopsy. Electronically Signed   By: Judie Petit.  Shick M.D.   On: 01/03/2023 09:37

## 2023-01-10 NOTE — Assessment & Plan Note (Signed)
Likely due to MDS. Pending work up

## 2023-01-10 NOTE — Telephone Encounter (Signed)
Called [facility] BM lab to request cytogenetics and Myeloid NGS testing sent on recent Bmbx.   Spoke to [place], who states that cytogenetics is automatically sent and Myeloid NGS was just sent today

## 2023-01-11 ENCOUNTER — Telehealth: Payer: Self-pay | Admitting: Psychiatry

## 2023-01-11 NOTE — Telephone Encounter (Signed)
Attempted to contact patient to discuss her concern.

## 2023-01-12 ENCOUNTER — Telehealth: Payer: Self-pay

## 2023-01-12 DIAGNOSIS — F411 Generalized anxiety disorder: Secondary | ICD-10-CM

## 2023-01-12 MED ORDER — HYDROXYZINE HCL 50 MG PO TABS: 25.0000 mg | ORAL_TABLET | Freq: Two times a day (BID) | ORAL | 1 refills | Status: DC | PRN

## 2023-01-12 NOTE — Telephone Encounter (Signed)
Contacted patient, discussed medication management for current anxiety.  I did not recommend benzodiazepines given the fact that she is on buprenorphine and oxycodone.  Patient agreeable to trial of hydroxyzine low dosage as needed.  Patient to use it only as needed for severe anxiety/agitation.

## 2023-01-12 NOTE — Telephone Encounter (Signed)
pt left message that she was returning your call.  

## 2023-01-14 ENCOUNTER — Encounter: Payer: Self-pay | Admitting: Oncology

## 2023-01-18 ENCOUNTER — Encounter (HOSPITAL_COMMUNITY): Payer: Self-pay | Admitting: Oncology

## 2023-01-19 ENCOUNTER — Telehealth: Payer: Self-pay

## 2023-01-19 NOTE — Telephone Encounter (Signed)
Receivd message from [employer] [facility] onc intake coordinator) stating that she received referral and will call pt to schedule.

## 2023-01-19 NOTE — Telephone Encounter (Signed)
Referral faxed to [facility] malignant heme. Attn Dr. Vertell Limber. Fax confirmation received  Ph: [phone] Fax: [phone]

## 2023-01-23 ENCOUNTER — Emergency Department
Admission: EM | Admit: 2023-01-23 | Discharge: 2023-01-23 | Disposition: A | Discharge status: Home / Self Care | Payer: Medicare HMO | Attending: Emergency Medicine | Admitting: Emergency Medicine

## 2023-01-23 ENCOUNTER — Emergency Department (HOSPITAL_COMMUNITY)
Admission: EM | Admit: 2023-01-23 | Discharge: 2023-01-23 | Disposition: A | Discharge status: Against Medical Advice | Payer: Medicare HMO | Attending: Emergency Medicine | Admitting: Emergency Medicine

## 2023-01-23 ENCOUNTER — Other Ambulatory Visit: Payer: Self-pay

## 2023-01-23 ENCOUNTER — Encounter (HOSPITAL_COMMUNITY): Payer: Self-pay

## 2023-01-23 ENCOUNTER — Emergency Department: Payer: Medicare HMO

## 2023-01-23 ENCOUNTER — Encounter: Payer: Self-pay | Admitting: Emergency Medicine

## 2023-01-23 DIAGNOSIS — R0781 Pleurodynia: Secondary | ICD-10-CM | POA: Diagnosis not present

## 2023-01-23 DIAGNOSIS — J189 Pneumonia, unspecified organism: Secondary | ICD-10-CM

## 2023-01-23 DIAGNOSIS — E1122 Type 2 diabetes mellitus with diabetic chronic kidney disease: Secondary | ICD-10-CM | POA: Diagnosis not present

## 2023-01-23 DIAGNOSIS — J181 Lobar pneumonia, unspecified organism: Secondary | ICD-10-CM | POA: Diagnosis not present

## 2023-01-23 DIAGNOSIS — R109 Unspecified abdominal pain: Secondary | ICD-10-CM | POA: Diagnosis not present

## 2023-01-23 DIAGNOSIS — Z5321 Procedure and treatment not carried out due to patient leaving prior to being seen by health care provider: Secondary | ICD-10-CM | POA: Insufficient documentation

## 2023-01-23 DIAGNOSIS — R1032 Left lower quadrant pain: Secondary | ICD-10-CM | POA: Diagnosis present

## 2023-01-23 DIAGNOSIS — R918 Other nonspecific abnormal finding of lung field: Secondary | ICD-10-CM | POA: Diagnosis not present

## 2023-01-23 DIAGNOSIS — R1012 Left upper quadrant pain: Secondary | ICD-10-CM | POA: Diagnosis not present

## 2023-01-23 DIAGNOSIS — I10 Essential (primary) hypertension: Secondary | ICD-10-CM | POA: Diagnosis not present

## 2023-01-23 DIAGNOSIS — K760 Fatty (change of) liver, not elsewhere classified: Secondary | ICD-10-CM | POA: Diagnosis not present

## 2023-01-23 DIAGNOSIS — N189 Chronic kidney disease, unspecified: Secondary | ICD-10-CM | POA: Diagnosis not present

## 2023-01-23 DIAGNOSIS — Z0000 Encounter for general adult medical examination without abnormal findings: Secondary | ICD-10-CM

## 2023-01-23 DIAGNOSIS — J168 Pneumonia due to other specified infectious organisms: Secondary | ICD-10-CM | POA: Diagnosis not present

## 2023-01-23 DIAGNOSIS — R0789 Other chest pain: Secondary | ICD-10-CM | POA: Diagnosis not present

## 2023-01-23 LAB — TROPONIN I (HIGH SENSITIVITY): Troponin I (High Sensitivity): 4 ng/L (ref <18)

## 2023-01-23 LAB — CBC
HCT: 30.5 % — ABNORMAL LOW (ref 36.0–46.0)
Hemoglobin: 9.9 g/dL — ABNORMAL LOW (ref 12.0–15.0)
MCH: 33.3 pg (ref 26.0–34.0)
MCHC: 32.5 g/dL (ref 30.0–36.0)
MCV: 102.7 fL — ABNORMAL HIGH (ref 80.0–100.0)
Platelets: 85 10*3/uL — ABNORMAL LOW (ref 150–400)
RBC: 2.97 MIL/uL — ABNORMAL LOW (ref 3.87–5.11)
RDW: 16.1 % — ABNORMAL HIGH (ref 11.5–15.5)
WBC: 3.3 10*3/uL — ABNORMAL LOW (ref 4.0–10.5)
nRBC: 2.4 % — ABNORMAL HIGH (ref 0.0–0.2)

## 2023-01-23 LAB — BASIC METABOLIC PANEL
Anion gap: 9 (ref 5–15)
BUN: 12 mg/dL (ref 8–23)
CO2: 28 mmol/L (ref 22–32)
Calcium: 8.5 mg/dL — ABNORMAL LOW (ref 8.9–10.3)
Chloride: 96 mmol/L — ABNORMAL LOW (ref 98–111)
Creatinine, Ser: 0.96 mg/dL (ref 0.44–1.00)
GFR, Estimated: >60 mL/min (ref >60)
Glucose, Bld: 275 mg/dL — ABNORMAL HIGH (ref 70–99)
Potassium: 4.1 mmol/L (ref 3.5–5.1)
Sodium: 133 mmol/L — ABNORMAL LOW (ref 135–145)

## 2023-01-23 MED ORDER — CEPHALEXIN 500 MG PO CAPS: 500.0000 mg | ORAL_CAPSULE | Freq: Three times a day (TID) | ORAL | 0 refills | Status: AC

## 2023-01-23 MED ORDER — IOHEXOL 300 MG/ML  SOLN
100.0000 mL | Freq: Once | INTRAMUSCULAR | Status: AC | PRN
  Administered 2023-01-23: 100 mL via INTRAVENOUS

## 2023-01-23 MED ORDER — AZITHROMYCIN 500 MG PO TABS
500.0000 mg | ORAL_TABLET | Freq: Once | ORAL | Status: AC
  Administered 2023-01-23: 500 mg via ORAL
  Filled 2023-01-23: qty 1

## 2023-01-23 MED ORDER — AZITHROMYCIN 250 MG PO TABS: ORAL_TABLET | ORAL | 0 refills | Status: AC

## 2023-01-23 MED ORDER — CEPHALEXIN 500 MG PO CAPS
500.0000 mg | ORAL_CAPSULE | Freq: Once | ORAL | Status: AC
  Administered 2023-01-23: 500 mg via ORAL
  Filled 2023-01-23: qty 1

## 2023-01-23 NOTE — ED Provider Notes (Signed)
[facility] Provider Note    Event Date/Time   First MD Initiated Contact with Patient 01/23/23 1232     (approximate)   History   Abdominal pain   HPI  Heather Haley is a 62 y.o. female with history of diabetes, CKD who presents with complaints of left lower quadrant abdominal pain which has been ongoing over the last 24 to 48 hours.  She reports it is more painful when she moves or takes a deep breath.  No history of diverticulitis reported, does report constipation as well over the last several days, no BM     Physical Exam   Triage Vital Signs: ED Triage Vitals  Encounter Vitals Group     BP 01/23/23 1143 (!) 163/85     Systolic BP Percentile --      Diastolic BP Percentile --      Pulse Rate 01/23/23 1143 (!) 107     Resp 01/23/23 1143 16     Temp 01/23/23 1143 98.5 F (36.9 C)     Temp Source 01/23/23 1143 Oral     SpO2 01/23/23 1143 93 %     Weight 01/23/23 1141 108.9 kg (240 lb)     Height 01/23/23 1321 1.651 m (5\' 5" )     Head Circumference --      Peak Flow --      Pain Score 01/23/23 1139 9     Pain Loc --      Pain Education --      Exclude from Growth Chart --     Most recent vital signs: Vitals:   01/23/23 1143  BP: (!) 163/85  Pulse: (!) 107  Resp: 16  Temp: 98.5 F (36.9 C)  SpO2: 93%     General: Awake, no distress.  CV:  Good peripheral perfusion.  Resp:  Normal effort.  Abd:  No distention.  Tenderness in the left lower quadrant Other:     ED Results / Procedures / Treatments   Labs (all labs ordered are listed, but only abnormal results are displayed) Labs Reviewed  BASIC METABOLIC PANEL - Abnormal; Notable for the following components:      Result Value   Sodium 133 (*)    Chloride 96 (*)    Glucose, Bld 275 (*)    Calcium 8.5 (*)    All other components within normal limits  CBC - Abnormal; Notable for the following components:   WBC 3.3 (*)    RBC 2.97 (*)    Hemoglobin 9.9 (*)    HCT  30.5 (*)    MCV 102.7 (*)    RDW 16.1 (*)    Platelets 85 (*)    nRBC 2.4 (*)    All other components within normal limits  TROPONIN I (HIGH SENSITIVITY)     EKG ED ECG REPORT I, Jene Every, the attending physician, personally viewed and interpreted this ECG.  Date: 01/23/2023  Rhythm: normal sinus rhythm QRS Axis: normal Intervals: Prolonged QT ST/T Wave abnormalities: normal Narrative Interpretation: no evidence of acute ischemia    RADIOLOGY CT abdomen pelvis viewed interpreted by me, no evidence of diverticulitis, questionable infiltrate lower lung    PROCEDURES:  Critical Care performed:   Procedures   MEDICATIONS ORDERED IN ED: Medications  cephALEXin (KEFLEX) capsule 500 mg (has no administration in time range)  azithromycin (ZITHROMAX) tablet 500 mg (has no administration in time range)  iohexol (OMNIPAQUE) 300 MG/ML solution 100 mL (100 mLs Intravenous Contrast  Given 01/23/23 1407)     IMPRESSION / MDM / ASSESSMENT AND PLAN / ED COURSE  I reviewed the triage vital signs and the nursing notes. Patient's presentation is most consistent with acute presentation with potential threat to life or bodily function.  Patient presents with abdominal pain as described above, given location differential includes diverticulitis, colitis, constipation, ureterolithiasis  Lab work reviewed, not significantly changed from prior, will send for CT abdomen pelvis to evaluate further.  Patient has deferred any pain medications  CT scan negative for diverticulitis, questionable infiltrate left lower lung, patient is mildly tachycardic, normal white blood cell count, afebrile, not quiring supplemental O2, will start p.o. antibiotics  No indication for admission at this time, appropriate for discharge, strict return precautions discussed, patient agrees to this plan      FINAL CLINICAL IMPRESSION(S) / ED DIAGNOSES   Final diagnoses:  Pneumonia of left lower lobe due  to infectious organism     Rx / DC Orders   ED Discharge Orders          Ordered    azithromycin (ZITHROMAX Z-PAK) 250 MG tablet        01/23/23 1515    cephALEXin (KEFLEX) 500 MG capsule  3 times daily        01/23/23 1515             Note:  This document was prepared using Dragon voice recognition software and may include unintentional dictation errors.   Jene Every, MD 01/23/23 [phone]

## 2023-01-23 NOTE — ED Triage Notes (Signed)
Pt c.o left sided ribcage/LUQ pain for the past 3 days. Pt denies n.v but has some sob. Denies recent injury or trauma.

## 2023-01-23 NOTE — ED Notes (Signed)
See triage note  Presents with left sided abd pain since Thursday  No fever  States pain is worse

## 2023-01-23 NOTE — ED Notes (Signed)
Patient eloped after stating "how long is it going to take." Patient explained risk and benefits of staying. A&Ox4.

## 2023-01-23 NOTE — ED Provider Notes (Signed)
Patient eloped from the emergency department prior to any medical evaluation by myself.  Reportedly walked out unassisted.   Rozelle Logan, DO 01/23/23 2130

## 2023-01-23 NOTE — ED Triage Notes (Signed)
C/O left sided ribcage/LUQ pain x 3 days. Denies injury.  Also c/o SOB.  Denies N/V  AAOx3.  Skin warm and dry. NAD

## 2023-01-24 ENCOUNTER — Encounter (HOSPITAL_COMMUNITY): Payer: Self-pay | Admitting: Oncology

## 2023-01-24 DIAGNOSIS — R002 Palpitations: Secondary | ICD-10-CM | POA: Diagnosis not present

## 2023-01-24 LAB — SURGICAL PATHOLOGY

## 2023-01-28 DIAGNOSIS — F331 Major depressive disorder, recurrent, moderate: Secondary | ICD-10-CM | POA: Diagnosis not present

## 2023-01-28 DIAGNOSIS — D469 Myelodysplastic syndrome, unspecified: Secondary | ICD-10-CM | POA: Diagnosis not present

## 2023-01-28 DIAGNOSIS — Z794 Long term (current) use of insulin: Secondary | ICD-10-CM | POA: Diagnosis not present

## 2023-01-28 DIAGNOSIS — I700 Atherosclerosis of aorta: Secondary | ICD-10-CM | POA: Insufficient documentation

## 2023-01-28 DIAGNOSIS — I10 Essential (primary) hypertension: Secondary | ICD-10-CM | POA: Diagnosis not present

## 2023-01-28 DIAGNOSIS — C9200 Acute myeloblastic leukemia, not having achieved remission: Secondary | ICD-10-CM | POA: Diagnosis present

## 2023-01-28 DIAGNOSIS — F259 Schizoaffective disorder, unspecified: Secondary | ICD-10-CM | POA: Diagnosis not present

## 2023-01-28 DIAGNOSIS — E1165 Type 2 diabetes mellitus with hyperglycemia: Secondary | ICD-10-CM | POA: Diagnosis not present

## 2023-01-28 DIAGNOSIS — E1142 Type 2 diabetes mellitus with diabetic polyneuropathy: Secondary | ICD-10-CM | POA: Diagnosis not present

## 2023-01-28 DIAGNOSIS — F1120 Opioid dependence, uncomplicated: Secondary | ICD-10-CM | POA: Diagnosis not present

## 2023-01-31 DIAGNOSIS — C9200 Acute myeloblastic leukemia, not having achieved remission: Secondary | ICD-10-CM | POA: Diagnosis not present

## 2023-02-04 DIAGNOSIS — C9200 Acute myeloblastic leukemia, not having achieved remission: Secondary | ICD-10-CM | POA: Diagnosis not present

## 2023-02-05 DIAGNOSIS — C9200 Acute myeloblastic leukemia, not having achieved remission: Secondary | ICD-10-CM | POA: Diagnosis not present

## 2023-02-07 ENCOUNTER — Telehealth: Payer: Self-pay | Admitting: Psychiatry

## 2023-02-07 NOTE — Telephone Encounter (Signed)
Patient called to cancel her appointment stating she will be starting cancer treatments and does not want to reschedule. Also states if you need to speak to her you can call her daughter. Advised patient if there is no ROI for 2 way communication in her chart, we cannot call daughter but would call her. She wanted you to be aware

## 2023-02-07 NOTE — Telephone Encounter (Signed)
Attempted to contact patient-had to leave a voicemail. ?

## 2023-02-08 ENCOUNTER — Ambulatory Visit: Payer: Medicare HMO | Admitting: Psychiatry

## 2023-02-10 DIAGNOSIS — G20A1 Parkinson's disease without dyskinesia, without mention of fluctuations: Secondary | ICD-10-CM | POA: Diagnosis not present

## 2023-02-10 DIAGNOSIS — C9200 Acute myeloblastic leukemia, not having achieved remission: Secondary | ICD-10-CM | POA: Diagnosis not present

## 2023-02-10 DIAGNOSIS — K219 Gastro-esophageal reflux disease without esophagitis: Secondary | ICD-10-CM | POA: Diagnosis not present

## 2023-02-10 DIAGNOSIS — G8929 Other chronic pain: Secondary | ICD-10-CM | POA: Diagnosis not present

## 2023-02-10 DIAGNOSIS — I2510 Atherosclerotic heart disease of native coronary artery without angina pectoris: Secondary | ICD-10-CM | POA: Diagnosis not present

## 2023-02-10 DIAGNOSIS — Z79891 Long term (current) use of opiate analgesic: Secondary | ICD-10-CM | POA: Diagnosis not present

## 2023-02-10 DIAGNOSIS — Z885 Allergy status to narcotic agent status: Secondary | ICD-10-CM | POA: Diagnosis not present

## 2023-02-10 DIAGNOSIS — D469 Myelodysplastic syndrome, unspecified: Secondary | ICD-10-CM | POA: Diagnosis not present

## 2023-02-10 DIAGNOSIS — E119 Type 2 diabetes mellitus without complications: Secondary | ICD-10-CM | POA: Diagnosis not present

## 2023-02-10 DIAGNOSIS — F209 Schizophrenia, unspecified: Secondary | ICD-10-CM | POA: Diagnosis not present

## 2023-02-14 ENCOUNTER — Telehealth: Payer: Self-pay | Admitting: Psychiatry

## 2023-02-14 DIAGNOSIS — F4310 Post-traumatic stress disorder, unspecified: Secondary | ICD-10-CM

## 2023-02-14 DIAGNOSIS — F251 Schizoaffective disorder, depressive type: Secondary | ICD-10-CM

## 2023-02-14 NOTE — Telephone Encounter (Signed)
 Contacted patient in response to her my chart message. The patient, with a recent diagnosis of cancer, reports experiencing shortness of breath on some days, but today was a good day. She has been experiencing pain in the lower part of her abdomen for about a year, which was initially dismissed as gas pain. Recently, the pain has radiated up to her armpit and across her chest. She has been waking up due to pain on her left side. She is currently taking all prescribed medications and has no need for refills or changes. She has a support system in place, including her son and daughter, who will be assisting her during her treatment. She has plans to move in with her son in [place] if she is unable to stay alone during chemotherapy.  She is not interested in making any medication changes at this time.  However agrees to schedule an appointment for February 11 at 11:30 AM .  She plans to come in for an appointment but if anything changes she will let us  know to change it into a virtual visit.  She also wants a release of information signed to share information with her son and daughter who are going to be her caregivers when she goes through chemotherapy.  I will let staff know to assist patient with this.

## 2023-02-16 DIAGNOSIS — C9200 Acute myeloblastic leukemia, not having achieved remission: Secondary | ICD-10-CM | POA: Diagnosis not present

## 2023-02-16 DIAGNOSIS — R791 Abnormal coagulation profile: Secondary | ICD-10-CM | POA: Diagnosis not present

## 2023-02-16 DIAGNOSIS — R790 Abnormal level of blood mineral: Secondary | ICD-10-CM | POA: Diagnosis not present

## 2023-02-21 DIAGNOSIS — Z5111 Encounter for antineoplastic chemotherapy: Secondary | ICD-10-CM | POA: Diagnosis not present

## 2023-02-21 DIAGNOSIS — C9200 Acute myeloblastic leukemia, not having achieved remission: Secondary | ICD-10-CM | POA: Diagnosis not present

## 2023-02-24 DIAGNOSIS — Z5111 Encounter for antineoplastic chemotherapy: Secondary | ICD-10-CM | POA: Diagnosis not present

## 2023-02-24 DIAGNOSIS — C9200 Acute myeloblastic leukemia, not having achieved remission: Secondary | ICD-10-CM | POA: Diagnosis not present

## 2023-03-01 DIAGNOSIS — I2510 Atherosclerotic heart disease of native coronary artery without angina pectoris: Secondary | ICD-10-CM | POA: Diagnosis not present

## 2023-03-01 DIAGNOSIS — Z885 Allergy status to narcotic agent status: Secondary | ICD-10-CM | POA: Diagnosis not present

## 2023-03-01 DIAGNOSIS — Z794 Long term (current) use of insulin: Secondary | ICD-10-CM | POA: Diagnosis not present

## 2023-03-01 DIAGNOSIS — G20A1 Parkinson's disease without dyskinesia, without mention of fluctuations: Secondary | ICD-10-CM | POA: Diagnosis not present

## 2023-03-01 DIAGNOSIS — M549 Dorsalgia, unspecified: Secondary | ICD-10-CM | POA: Diagnosis not present

## 2023-03-01 DIAGNOSIS — N182 Chronic kidney disease, stage 2 (mild): Secondary | ICD-10-CM | POA: Diagnosis not present

## 2023-03-01 DIAGNOSIS — G8929 Other chronic pain: Secondary | ICD-10-CM | POA: Diagnosis not present

## 2023-03-01 DIAGNOSIS — C9200 Acute myeloblastic leukemia, not having achieved remission: Secondary | ICD-10-CM | POA: Diagnosis not present

## 2023-03-01 DIAGNOSIS — D696 Thrombocytopenia, unspecified: Secondary | ICD-10-CM | POA: Diagnosis not present

## 2023-03-01 DIAGNOSIS — F209 Schizophrenia, unspecified: Secondary | ICD-10-CM | POA: Diagnosis not present

## 2023-03-04 DIAGNOSIS — C9200 Acute myeloblastic leukemia, not having achieved remission: Secondary | ICD-10-CM | POA: Diagnosis not present

## 2023-03-06 ENCOUNTER — Emergency Department (HOSPITAL_COMMUNITY): Payer: Medicare HMO

## 2023-03-06 ENCOUNTER — Other Ambulatory Visit: Payer: Self-pay

## 2023-03-06 ENCOUNTER — Inpatient Hospital Stay (HOSPITAL_COMMUNITY)
Admission: EM | Admit: 2023-03-06 | Discharge: 2023-03-11 | DRG: 177 | Disposition: A | Discharge status: Other Acute Inpatient Hospital | Payer: Medicare HMO | Attending: Internal Medicine | Admitting: Internal Medicine

## 2023-03-06 DIAGNOSIS — S065X9A Traumatic subdural hemorrhage with loss of consciousness of unspecified duration, initial encounter: Secondary | ICD-10-CM | POA: Diagnosis not present

## 2023-03-06 DIAGNOSIS — R5081 Fever presenting with conditions classified elsewhere: Secondary | ICD-10-CM | POA: Diagnosis present

## 2023-03-06 DIAGNOSIS — R509 Fever, unspecified: Secondary | ICD-10-CM | POA: Diagnosis not present

## 2023-03-06 DIAGNOSIS — Y92002 Bathroom of unspecified non-institutional (private) residence single-family (private) house as the place of occurrence of the external cause: Secondary | ICD-10-CM

## 2023-03-06 DIAGNOSIS — F333 Major depressive disorder, recurrent, severe with psychotic symptoms: Secondary | ICD-10-CM | POA: Diagnosis present

## 2023-03-06 DIAGNOSIS — R5381 Other malaise: Secondary | ICD-10-CM | POA: Diagnosis present

## 2023-03-06 DIAGNOSIS — D709 Neutropenia, unspecified: Secondary | ICD-10-CM | POA: Diagnosis present

## 2023-03-06 DIAGNOSIS — Z043 Encounter for examination and observation following other accident: Secondary | ICD-10-CM | POA: Diagnosis not present

## 2023-03-06 DIAGNOSIS — K581 Irritable bowel syndrome with constipation: Secondary | ICD-10-CM | POA: Diagnosis present

## 2023-03-06 DIAGNOSIS — I10 Essential (primary) hypertension: Secondary | ICD-10-CM | POA: Diagnosis not present

## 2023-03-06 DIAGNOSIS — I6200 Nontraumatic subdural hemorrhage, unspecified: Secondary | ICD-10-CM | POA: Diagnosis not present

## 2023-03-06 DIAGNOSIS — Z7982 Long term (current) use of aspirin: Secondary | ICD-10-CM

## 2023-03-06 DIAGNOSIS — E1141 Type 2 diabetes mellitus with diabetic mononeuropathy: Secondary | ICD-10-CM | POA: Diagnosis present

## 2023-03-06 DIAGNOSIS — Z79899 Other long term (current) drug therapy: Secondary | ICD-10-CM | POA: Diagnosis not present

## 2023-03-06 DIAGNOSIS — C9200 Acute myeloblastic leukemia, not having achieved remission: Secondary | ICD-10-CM | POA: Diagnosis present

## 2023-03-06 DIAGNOSIS — R292 Abnormal reflex: Secondary | ICD-10-CM | POA: Diagnosis present

## 2023-03-06 DIAGNOSIS — R339 Retention of urine, unspecified: Secondary | ICD-10-CM | POA: Diagnosis present

## 2023-03-06 DIAGNOSIS — E7800 Pure hypercholesterolemia, unspecified: Secondary | ICD-10-CM | POA: Diagnosis present

## 2023-03-06 DIAGNOSIS — G8929 Other chronic pain: Secondary | ICD-10-CM | POA: Diagnosis present

## 2023-03-06 DIAGNOSIS — E871 Hypo-osmolality and hyponatremia: Secondary | ICD-10-CM | POA: Diagnosis not present

## 2023-03-06 DIAGNOSIS — M50323 Other cervical disc degeneration at C6-C7 level: Secondary | ICD-10-CM | POA: Diagnosis not present

## 2023-03-06 DIAGNOSIS — E66812 Obesity, class 2: Secondary | ICD-10-CM | POA: Diagnosis present

## 2023-03-06 DIAGNOSIS — Z888 Allergy status to other drugs, medicaments and biological substances status: Secondary | ICD-10-CM

## 2023-03-06 DIAGNOSIS — S0003XA Contusion of scalp, initial encounter: Secondary | ICD-10-CM | POA: Diagnosis present

## 2023-03-06 DIAGNOSIS — E1122 Type 2 diabetes mellitus with diabetic chronic kidney disease: Secondary | ICD-10-CM | POA: Diagnosis present

## 2023-03-06 DIAGNOSIS — D61818 Other pancytopenia: Secondary | ICD-10-CM | POA: Diagnosis present

## 2023-03-06 DIAGNOSIS — R29818 Other symptoms and signs involving the nervous system: Secondary | ICD-10-CM | POA: Diagnosis not present

## 2023-03-06 DIAGNOSIS — J9601 Acute respiratory failure with hypoxia: Secondary | ICD-10-CM | POA: Diagnosis not present

## 2023-03-06 DIAGNOSIS — G4733 Obstructive sleep apnea (adult) (pediatric): Secondary | ICD-10-CM | POA: Diagnosis present

## 2023-03-06 DIAGNOSIS — R4182 Altered mental status, unspecified: Secondary | ICD-10-CM | POA: Diagnosis not present

## 2023-03-06 DIAGNOSIS — K219 Gastro-esophageal reflux disease without esophagitis: Secondary | ICD-10-CM | POA: Diagnosis present

## 2023-03-06 DIAGNOSIS — E1165 Type 2 diabetes mellitus with hyperglycemia: Secondary | ICD-10-CM | POA: Diagnosis present

## 2023-03-06 DIAGNOSIS — I129 Hypertensive chronic kidney disease with stage 1 through stage 4 chronic kidney disease, or unspecified chronic kidney disease: Secondary | ICD-10-CM | POA: Diagnosis present

## 2023-03-06 DIAGNOSIS — G9341 Metabolic encephalopathy: Secondary | ICD-10-CM | POA: Diagnosis not present

## 2023-03-06 DIAGNOSIS — G2119 Other drug induced secondary parkinsonism: Secondary | ICD-10-CM | POA: Diagnosis not present

## 2023-03-06 DIAGNOSIS — E119 Type 2 diabetes mellitus without complications: Secondary | ICD-10-CM

## 2023-03-06 DIAGNOSIS — S065XAA Traumatic subdural hemorrhage with loss of consciousness status unknown, initial encounter: Secondary | ICD-10-CM | POA: Diagnosis not present

## 2023-03-06 DIAGNOSIS — Z1152 Encounter for screening for COVID-19: Secondary | ICD-10-CM | POA: Diagnosis not present

## 2023-03-06 DIAGNOSIS — E1142 Type 2 diabetes mellitus with diabetic polyneuropathy: Secondary | ICD-10-CM | POA: Diagnosis present

## 2023-03-06 DIAGNOSIS — N182 Chronic kidney disease, stage 2 (mild): Secondary | ICD-10-CM | POA: Diagnosis present

## 2023-03-06 DIAGNOSIS — S0990XA Unspecified injury of head, initial encounter: Secondary | ICD-10-CM | POA: Diagnosis not present

## 2023-03-06 DIAGNOSIS — R0602 Shortness of breath: Secondary | ICD-10-CM | POA: Diagnosis not present

## 2023-03-06 DIAGNOSIS — K5909 Other constipation: Secondary | ICD-10-CM | POA: Diagnosis present

## 2023-03-06 DIAGNOSIS — G2581 Restless legs syndrome: Secondary | ICD-10-CM | POA: Diagnosis not present

## 2023-03-06 DIAGNOSIS — Z885 Allergy status to narcotic agent status: Secondary | ICD-10-CM

## 2023-03-06 DIAGNOSIS — S0093XA Contusion of unspecified part of head, initial encounter: Secondary | ICD-10-CM | POA: Diagnosis not present

## 2023-03-06 DIAGNOSIS — F251 Schizoaffective disorder, depressive type: Secondary | ICD-10-CM | POA: Diagnosis present

## 2023-03-06 DIAGNOSIS — E440 Moderate protein-calorie malnutrition: Secondary | ICD-10-CM | POA: Diagnosis not present

## 2023-03-06 DIAGNOSIS — R0989 Other specified symptoms and signs involving the circulatory and respiratory systems: Secondary | ICD-10-CM | POA: Diagnosis not present

## 2023-03-06 DIAGNOSIS — S065X0A Traumatic subdural hemorrhage without loss of consciousness, initial encounter: Secondary | ICD-10-CM | POA: Diagnosis not present

## 2023-03-06 DIAGNOSIS — Z8659 Personal history of other mental and behavioral disorders: Secondary | ICD-10-CM

## 2023-03-06 DIAGNOSIS — I700 Atherosclerosis of aorta: Secondary | ICD-10-CM | POA: Diagnosis not present

## 2023-03-06 DIAGNOSIS — G253 Myoclonus: Secondary | ICD-10-CM | POA: Diagnosis not present

## 2023-03-06 DIAGNOSIS — F411 Generalized anxiety disorder: Secondary | ICD-10-CM | POA: Diagnosis present

## 2023-03-06 DIAGNOSIS — C9201 Acute myeloblastic leukemia, in remission: Secondary | ICD-10-CM | POA: Diagnosis not present

## 2023-03-06 DIAGNOSIS — J690 Pneumonitis due to inhalation of food and vomit: Secondary | ICD-10-CM | POA: Diagnosis not present

## 2023-03-06 DIAGNOSIS — Z880 Allergy status to penicillin: Secondary | ICD-10-CM

## 2023-03-06 DIAGNOSIS — Z8249 Family history of ischemic heart disease and other diseases of the circulatory system: Secondary | ICD-10-CM

## 2023-03-06 DIAGNOSIS — R410 Disorientation, unspecified: Secondary | ICD-10-CM | POA: Diagnosis not present

## 2023-03-06 DIAGNOSIS — R404 Transient alteration of awareness: Secondary | ICD-10-CM | POA: Diagnosis not present

## 2023-03-06 DIAGNOSIS — N189 Chronic kidney disease, unspecified: Secondary | ICD-10-CM | POA: Diagnosis not present

## 2023-03-06 DIAGNOSIS — D84821 Immunodeficiency due to drugs: Secondary | ICD-10-CM | POA: Diagnosis not present

## 2023-03-06 DIAGNOSIS — Z87891 Personal history of nicotine dependence: Secondary | ICD-10-CM

## 2023-03-06 DIAGNOSIS — J181 Lobar pneumonia, unspecified organism: Secondary | ICD-10-CM | POA: Diagnosis not present

## 2023-03-06 DIAGNOSIS — R918 Other nonspecific abnormal finding of lung field: Secondary | ICD-10-CM | POA: Diagnosis not present

## 2023-03-06 DIAGNOSIS — Z79890 Hormone replacement therapy (postmenopausal): Secondary | ICD-10-CM

## 2023-03-06 DIAGNOSIS — R9401 Abnormal electroencephalogram [EEG]: Secondary | ICD-10-CM | POA: Diagnosis not present

## 2023-03-06 DIAGNOSIS — Z886 Allergy status to analgesic agent status: Secondary | ICD-10-CM

## 2023-03-06 DIAGNOSIS — Z96652 Presence of left artificial knee joint: Secondary | ICD-10-CM | POA: Diagnosis present

## 2023-03-06 DIAGNOSIS — R55 Syncope and collapse: Secondary | ICD-10-CM | POA: Diagnosis not present

## 2023-03-06 DIAGNOSIS — W1830XA Fall on same level, unspecified, initial encounter: Secondary | ICD-10-CM | POA: Diagnosis present

## 2023-03-06 DIAGNOSIS — F4310 Post-traumatic stress disorder, unspecified: Secondary | ICD-10-CM | POA: Diagnosis present

## 2023-03-06 DIAGNOSIS — J189 Pneumonia, unspecified organism: Secondary | ICD-10-CM

## 2023-03-06 DIAGNOSIS — Z6839 Body mass index (BMI) 39.0-39.9, adult: Secondary | ICD-10-CM

## 2023-03-06 DIAGNOSIS — Z794 Long term (current) use of insulin: Secondary | ICD-10-CM

## 2023-03-06 DIAGNOSIS — W19XXXA Unspecified fall, initial encounter: Secondary | ICD-10-CM | POA: Diagnosis not present

## 2023-03-06 DIAGNOSIS — Z7984 Long term (current) use of oral hypoglycemic drugs: Secondary | ICD-10-CM

## 2023-03-06 DIAGNOSIS — Z881 Allergy status to other antibiotic agents status: Secondary | ICD-10-CM

## 2023-03-06 DIAGNOSIS — J90 Pleural effusion, not elsewhere classified: Secondary | ICD-10-CM | POA: Diagnosis not present

## 2023-03-06 DIAGNOSIS — R0902 Hypoxemia: Secondary | ICD-10-CM | POA: Diagnosis not present

## 2023-03-06 LAB — URINALYSIS, ROUTINE W REFLEX MICROSCOPIC
Bacteria, UA: NONE SEEN
Bilirubin Urine: NEGATIVE
Glucose, UA: NEGATIVE mg/dL
Hgb urine dipstick: NEGATIVE
Ketones, ur: 20 mg/dL — AB
Leukocytes,Ua: NEGATIVE
Nitrite: NEGATIVE
Protein, ur: 30 mg/dL — AB
Specific Gravity, Urine: 1.015 (ref 1.005–1.030)
pH: 5 (ref 5.0–8.0)

## 2023-03-06 LAB — TROPONIN I (HIGH SENSITIVITY)
Troponin I (High Sensitivity): 5 ng/L (ref <18)
Troponin I (High Sensitivity): 4 ng/L (ref <18)

## 2023-03-06 LAB — BASIC METABOLIC PANEL
Anion gap: 13 (ref 5–15)
BUN: 18 mg/dL (ref 8–23)
CO2: 26 mmol/L (ref 22–32)
Calcium: 8.1 mg/dL — ABNORMAL LOW (ref 8.9–10.3)
Chloride: 90 mmol/L — ABNORMAL LOW (ref 98–111)
Creatinine, Ser: 0.82 mg/dL (ref 0.44–1.00)
GFR, Estimated: >60 mL/min (ref >60)
Glucose, Bld: 296 mg/dL — ABNORMAL HIGH (ref 70–99)
Potassium: 3.6 mmol/L (ref 3.5–5.1)
Sodium: 129 mmol/L — ABNORMAL LOW (ref 135–145)

## 2023-03-06 LAB — CBC WITH DIFFERENTIAL/PLATELET
Abs Immature Granulocytes: 0.04 10*3/uL (ref 0.00–0.07)
Basophils Absolute: 0 10*3/uL (ref 0.0–0.1)
Basophils Relative: 0 %
Eosinophils Absolute: 0 10*3/uL (ref 0.0–0.5)
Eosinophils Relative: 0 %
HCT: 28.4 % — ABNORMAL LOW (ref 36.0–46.0)
Hemoglobin: 8.8 g/dL — ABNORMAL LOW (ref 12.0–15.0)
Immature Granulocytes: 4 %
Lymphocytes Relative: 12 %
Lymphs Abs: 0.1 10*3/uL — ABNORMAL LOW (ref 0.7–4.0)
MCH: 30.1 pg (ref 26.0–34.0)
MCHC: 31 g/dL (ref 30.0–36.0)
MCV: 97.3 fL (ref 80.0–100.0)
Monocytes Absolute: 0 10*3/uL — ABNORMAL LOW (ref 0.1–1.0)
Monocytes Relative: 4 %
Neutro Abs: 0.9 10*3/uL — ABNORMAL LOW (ref 1.7–7.7)
Neutrophils Relative %: 80 %
Platelets: 62 10*3/uL — ABNORMAL LOW (ref 150–400)
RBC: 2.92 MIL/uL — ABNORMAL LOW (ref 3.87–5.11)
RDW: 18.8 % — ABNORMAL HIGH (ref 11.5–15.5)
WBC: 1.1 10*3/uL — CL (ref 4.0–10.5)
nRBC: 3.8 % — ABNORMAL HIGH (ref 0.0–0.2)

## 2023-03-06 LAB — CK: Total CK: 56 U/L (ref 38–234)

## 2023-03-06 LAB — CBG MONITORING, ED: Glucose-Capillary: 252 mg/dL — ABNORMAL HIGH (ref 70–99)

## 2023-03-06 LAB — HEMOGLOBIN A1C
Hgb A1c MFr Bld: 7 % — ABNORMAL HIGH (ref 4.8–5.6)
Mean Plasma Glucose: 154.2 mg/dL

## 2023-03-06 LAB — GLUCOSE, CAPILLARY: Glucose-Capillary: 204 mg/dL — ABNORMAL HIGH (ref 70–99)

## 2023-03-06 MED ORDER — VENETOCLAX 100 MG PO TABS: 400.0000 mg | ORAL_TABLET | Freq: Every day | ORAL | Status: DC

## 2023-03-06 MED ORDER — ALLOPURINOL 300 MG PO TABS
300.0000 mg | ORAL_TABLET | Freq: Every day | ORAL | Status: DC
  Administered 2023-03-06 – 2023-03-10 (×5): 300 mg via ORAL
  Filled 2023-03-06 (×5): qty 1

## 2023-03-06 MED ORDER — OLANZAPINE 5 MG PO TABS
2.5000 mg | ORAL_TABLET | Freq: Every day | ORAL | Status: DC
  Administered 2023-03-06 – 2023-03-10 (×5): 2.5 mg via ORAL
  Filled 2023-03-06 (×5): qty 1

## 2023-03-06 MED ORDER — SUMATRIPTAN SUCCINATE 50 MG PO TABS
100.0000 mg | ORAL_TABLET | ORAL | Status: DC | PRN
  Administered 2023-03-08 – 2023-03-10 (×3): 100 mg via ORAL
  Filled 2023-03-06 (×5): qty 2

## 2023-03-06 MED ORDER — LEVOTHYROXINE SODIUM 50 MCG PO TABS: 50.0000 ug | ORAL_TABLET | ORAL | Status: DC

## 2023-03-06 MED ORDER — LEVOTHYROXINE SODIUM 75 MCG PO TABS: 75.0000 ug | ORAL_TABLET | ORAL | Status: DC

## 2023-03-06 MED ORDER — ACETAMINOPHEN 325 MG PO TABS
650.0000 mg | ORAL_TABLET | Freq: Four times a day (QID) | ORAL | Status: DC | PRN
  Administered 2023-03-06: 650 mg via ORAL
  Filled 2023-03-06: qty 2

## 2023-03-06 MED ORDER — LEVOTHYROXINE SODIUM 50 MCG PO TABS
50.0000 ug | ORAL_TABLET | ORAL | Status: DC
  Administered 2023-03-07 – 2023-03-10 (×4): 50 ug via ORAL
  Filled 2023-03-06 (×4): qty 1

## 2023-03-06 MED ORDER — ISOSORBIDE MONONITRATE ER 30 MG PO TB24
30.0000 mg | ORAL_TABLET | Freq: Every day | ORAL | Status: DC
  Administered 2023-03-06 – 2023-03-10 (×5): 30 mg via ORAL
  Filled 2023-03-06 (×5): qty 1

## 2023-03-06 MED ORDER — AMLODIPINE BESYLATE 5 MG PO TABS
5.0000 mg | ORAL_TABLET | Freq: Every day | ORAL | Status: DC
  Administered 2023-03-06 – 2023-03-10 (×5): 5 mg via ORAL
  Filled 2023-03-06 (×5): qty 1

## 2023-03-06 MED ORDER — METFORMIN HCL 500 MG PO TABS
1000.0000 mg | ORAL_TABLET | Freq: Two times a day (BID) | ORAL | Status: DC
Start: 2023-03-07 — End: 2023-03-11
  Administered 2023-03-07 – 2023-03-10 (×8): 1000 mg via ORAL
  Filled 2023-03-06 (×9): qty 2

## 2023-03-06 MED ORDER — ROPINIROLE HCL 0.25 MG PO TABS
0.5000 mg | ORAL_TABLET | Freq: Three times a day (TID) | ORAL | Status: DC
  Administered 2023-03-06 – 2023-03-10 (×13): 0.5 mg via ORAL
  Filled 2023-03-06 (×13): qty 2

## 2023-03-06 MED ORDER — CARMEX CLASSIC LIP BALM EX OINT
TOPICAL_OINTMENT | CUTANEOUS | Status: DC | PRN
  Filled 2023-03-06: qty 10

## 2023-03-06 MED ORDER — ONDANSETRON HCL 4 MG/2ML IJ SOLN: 4.0000 mg | Freq: Four times a day (QID) | INTRAMUSCULAR | Status: DC | PRN

## 2023-03-06 MED ORDER — HYDROXYZINE HCL 25 MG PO TABS
25.0000 mg | ORAL_TABLET | ORAL | Status: DC | PRN
  Administered 2023-03-06 – 2023-03-10 (×6): 25 mg via ORAL
  Filled 2023-03-06 (×6): qty 1

## 2023-03-06 MED ORDER — INSULIN DEGLUDEC 100 UNIT/ML ~~LOC~~ SOPN: 38.0000 [IU] | PEN_INJECTOR | Freq: Every day | SUBCUTANEOUS | Status: DC

## 2023-03-06 MED ORDER — INSULIN GLARGINE-YFGN 100 UNIT/ML ~~LOC~~ SOLN
38.0000 [IU] | Freq: Every day | SUBCUTANEOUS | Status: DC
  Administered 2023-03-06 – 2023-03-10 (×5): 38 [IU] via SUBCUTANEOUS
  Filled 2023-03-06 (×5): qty 0.38

## 2023-03-06 MED ORDER — MAGNESIUM OXIDE -MG SUPPLEMENT 400 (240 MG) MG PO TABS
800.0000 mg | ORAL_TABLET | Freq: Every day | ORAL | Status: DC
  Administered 2023-03-06 – 2023-03-09 (×4): 800 mg via ORAL
  Filled 2023-03-06 (×4): qty 2

## 2023-03-06 MED ORDER — INSULIN ASPART 100 UNIT/ML IJ SOLN
0.0000 [IU] | Freq: Three times a day (TID) | INTRAMUSCULAR | Status: DC
  Administered 2023-03-06: 8 [IU] via SUBCUTANEOUS
  Administered 2023-03-07 (×3): 2 [IU] via SUBCUTANEOUS
  Administered 2023-03-08: 3 [IU] via SUBCUTANEOUS
  Administered 2023-03-09 – 2023-03-10 (×4): 2 [IU] via SUBCUTANEOUS
  Filled 2023-03-06: qty 0.15

## 2023-03-06 MED ORDER — SEMAGLUTIDE 3 MG PO TABS
3.0000 mg | ORAL_TABLET | Freq: Every day | ORAL | Status: DC
Start: 2023-03-06 — End: 2023-03-06

## 2023-03-06 MED ORDER — AMITRIPTYLINE HCL 25 MG PO TABS
75.0000 mg | ORAL_TABLET | Freq: Every day | ORAL | Status: DC
  Administered 2023-03-06 – 2023-03-10 (×5): 75 mg via ORAL
  Filled 2023-03-06 (×5): qty 3

## 2023-03-06 MED ORDER — TORSEMIDE 20 MG PO TABS
20.0000 mg | ORAL_TABLET | ORAL | Status: DC
  Administered 2023-03-07 – 2023-03-09 (×2): 20 mg via ORAL
  Filled 2023-03-06 (×2): qty 1

## 2023-03-06 MED ORDER — ONDANSETRON HCL 4 MG PO TABS: 4.0000 mg | ORAL_TABLET | Freq: Four times a day (QID) | ORAL | Status: DC | PRN

## 2023-03-06 MED ORDER — METOPROLOL TARTRATE 25 MG PO TABS
25.0000 mg | ORAL_TABLET | Freq: Two times a day (BID) | ORAL | Status: DC
  Administered 2023-03-06 – 2023-03-10 (×9): 25 mg via ORAL
  Filled 2023-03-06 (×9): qty 1

## 2023-03-06 MED ORDER — SODIUM CHLORIDE 0.9 % IV SOLN
2.0000 g | INTRAVENOUS | Status: DC
  Administered 2023-03-06: 2 g via INTRAVENOUS
  Filled 2023-03-06: qty 20

## 2023-03-06 MED ORDER — CARBIDOPA-LEVODOPA 25-100 MG PO TABS
1.0000 | ORAL_TABLET | Freq: Three times a day (TID) | ORAL | Status: DC
  Administered 2023-03-06 – 2023-03-10 (×13): 1 via ORAL
  Filled 2023-03-06 (×13): qty 1

## 2023-03-06 MED ORDER — ACETAMINOPHEN 650 MG RE SUPP: 650.0000 mg | Freq: Four times a day (QID) | RECTAL | Status: DC | PRN

## 2023-03-06 MED ORDER — SODIUM CHLORIDE 0.9 % IV SOLN
500.0000 mg | INTRAVENOUS | Status: DC
  Administered 2023-03-06: 500 mg via INTRAVENOUS
  Filled 2023-03-06: qty 5

## 2023-03-06 MED ORDER — IPRATROPIUM-ALBUTEROL 0.5-2.5 (3) MG/3ML IN SOLN
3.0000 mL | Freq: Four times a day (QID) | RESPIRATORY_TRACT | Status: DC
  Administered 2023-03-06 – 2023-03-09 (×10): 3 mL via RESPIRATORY_TRACT
  Filled 2023-03-06 (×11): qty 3

## 2023-03-06 MED ORDER — OXYCODONE HCL 5 MG PO TABS
5.0000 mg | ORAL_TABLET | Freq: Two times a day (BID) | ORAL | Status: DC | PRN
Start: 2023-03-06 — End: 2023-03-07
  Administered 2023-03-06 – 2023-03-07 (×2): 5 mg via ORAL
  Filled 2023-03-06 (×3): qty 1

## 2023-03-06 NOTE — ED Notes (Signed)
Heather Haley (son) [phone]

## 2023-03-06 NOTE — Progress Notes (Signed)
Family is at the bedside with concerns states the patient is in an "AML cancer clinical trial" with [facility] and was told if she spikes a fever, pt needs to report to [facility] immediately due to possible underlying side effects from the chemo drug. Family understands pt may spike a fever due to pneumonia. Informed family they may contact [facility] to inform them of the pt's condition.The dept is closed at this time per family.   Family is also concerned because pt usually takes venetoclax each night at 2000; however, it is still on hold. Spoke with the pharmacist, the oncologist will not be able to approve this medication until tomorrow. On call provider and charge RN notified of all the family concerns.

## 2023-03-06 NOTE — Progress Notes (Signed)
Prior-To-Admission Oral Chemotherapy for Treatment of Oncologic Disease   Order noted from Dr. Sanda Klein to continue prior-to-admission oral chemotherapy regimen of venetoclax 400 mg daily.  Procedure Per Pharmacy & Therapeutics Committee Policy: Orders for continuation of home oral chemotherapy for treatment of an oncologic disease will be held unless approved by an oncologist during current admission.    For patients receiving oncology care at [facility], inpatient pharmacist contacts patient's oncologist during regular office hours to review. If earlier review is medically necessary, attending physician consults [facility] on-call oncologist   For patients receiving oncology care outside of [facility], attending physician consults patient's oncologist to review. If this oncologist or their coverage cannot be reached, attending physician consults [facility] on-call oncologist   Oral chemotherapy continuation order is on hold pending oncologist review, Non-[facility] oncologist Cindra Presume, MD from [facility] provides oncology care and should be consulted by attending physician   Herby Abraham, Pharm.D Use secure chat for questions 03/06/2023 7:23 PM

## 2023-03-06 NOTE — H&P (Signed)
History and Physical    Patient: Heather Haley ZOX:[id number] DOB: December 27, 1960 DOA: 03/06/2023 DOS: the patient was seen and examined on 03/06/2023 PCP: Patient, No Pcp Per  Patient coming from: Home  Chief Complaint:  Chief Complaint  Patient presents with   Fall   HPI: Heather Haley is a 63 y.o. female with medical history significant of anxiety, depression, schizoaffective disorder, schizophrenia, PTSD, insomnia, tobacco abuse, drug-induced Parkinson's disease, asthma, carpal tunnel syndrome CKD, chronic lower back pain, pancytopenia, nephrolithiasis, hyperlipidemia, hypertension, IBS, left ankle instability, left knee DJD, neuromuscular disorder, diabetic peripheral neuropathy, postoperative nausea and vomiting Who presented to the emergency department with a history of waking up, having a fall with LOC and waking up with a headache and a head hematoma.  Her mental status has had some slow, but steady decline in recent months while getting chemotherapy according to her son, but she is still not fully back to her recent mental state.  She knows she is in [place] at a [facility], knew the month, but stated it was 2024.  She was not fully aware of the situation.  She is able to answer simple questions.  She still has a headache, but no chest, back or abdominal pain at the moment.  Lab work: CBC showed white count of 1.1, hemoglobin 8.8 g/dL platelets 62.  6 weeks ago WBC 3.3, hemoglobin 9.9 g/deciliter and platelets 85.  Total CK was normal.  Troponin x 2 unremarkable.  BMP showed normal electrolytes, except for calcium level.  Renal function was normal, glucose 296 and calcium 8.1 mg/dL.  Imaging: 2 view chest radiograph showing left lower lobe consolidation, new from last month.  Consider aspiration pneumonia in the setting.  CT head without contrast show a large posterior scalp hematoma with no underlying school fracture, but positive for a small parafalcine subdural  hematoma, up to 3 mm.  No intracranial mass effect.  No acute intracranial abnormality.   ED course: Initial vital signs were temperature 97.3 F, pulse 95, respiration 16, BP 118/64 mmHg O2 sat 94% on nasal cannula oxygen at 2 LPM.  Review of Systems: As mentioned in the history of present illness. All other systems reviewed and are negative. Past Medical History:  Diagnosis Date   Anxiety    Asthma    Chronic kidney disease    Chronic pain    previously saw Dr. Shireen Quan in pain clinic, then saw pain specialist in [facility]   Depression    Diabetes mellitus (HCC)    Frequency of urination    GERD (gastroesophageal reflux disease)    Headache(784.0)    High cholesterol    History of kidney stones    Hypertension    IBS (irritable bowel syndrome)    Left ankle instability    Left knee DJD    Lumbar Degenerative Disc Disease of  10/11/2014   Neuromuscular disorder (HCC)    Osteoarthritis of hip (Right) 05/05/2015   Other enthesopathy of ankle and tarsus 12/15/2009   Qualifier: Diagnosis of  By: Darrick Penna MD, KARL     Parkinson's disease (HCC)    Peripheral sensory neuropathy (Bilateral) 11/19/2014   Postoperative nausea and vomiting    Schizophrenia (HCC)    Past Surgical History:  Procedure Laterality Date   ABDOMINAL HYSTERECTOMY     ANKLE SURGERY Left    x 2   APPENDECTOMY     CARDIAC CATHETERIZATION     COLONOSCOPY  2013   COLONOSCOPY WITH PROPOFOL N/A 04/25/2017  Procedure: COLONOSCOPY WITH PROPOFOL;  Surgeon: Scot Jun, MD;  Location: [facility] ENDOSCOPY;  Service: Endoscopy;  Laterality: N/A;   COLONOSCOPY WITH PROPOFOL N/A 05/25/2022   Procedure: COLONOSCOPY WITH PROPOFOL;  Surgeon: [name], MD;  Location: [facility] ENDOSCOPY;  Service: Endoscopy;  Laterality: N/A;   CYSTOSCOPY/URETEROSCOPY/HOLMIUM LASER/STENT PLACEMENT Left 09/11/2019   Procedure: CYSTOSCOPY/URETEROSCOPY/HOLMIUM LASER/STENT PLACEMENT;  Surgeon: Riki Altes, MD;  Location: [facility] ORS;  Service:  Urology;  Laterality: Left;   ESOPHAGOGASTRODUODENOSCOPY (EGD) WITH PROPOFOL N/A 10/03/2014   Procedure: ESOPHAGOGASTRODUODENOSCOPY (EGD) WITH PROPOFOL;  Surgeon: Elnita Maxwell, MD;  Location: [facility] ENDOSCOPY;  Service: Endoscopy;  Laterality: N/A;   ESOPHAGOGASTRODUODENOSCOPY (EGD) WITH PROPOFOL  04/25/2017   Procedure: ESOPHAGOGASTRODUODENOSCOPY (EGD) WITH PROPOFOL;  Surgeon: Scot Jun, MD;  Location: [facility] ENDOSCOPY;  Service: Endoscopy;;   ESOPHAGOGASTRODUODENOSCOPY (EGD) WITH PROPOFOL N/A 05/25/2022   Procedure: ESOPHAGOGASTRODUODENOSCOPY (EGD) WITH PROPOFOL;  Surgeon: [name], MD;  Location: [facility] ENDOSCOPY;  Service: Endoscopy;  Laterality: N/A;   IR BONE MARROW BIOPSY & ASPIRATION  01/03/2023   JOINT REPLACEMENT Left    knee   KNEE ARTHROSCOPY  1997   left knee   LEFT HEART CATH AND CORONARY ANGIOGRAPHY Left 11/10/2017   Procedure: LEFT HEART CATH AND CORONARY ANGIOGRAPHY;  Surgeon: Marcina Millard, MD;  Location: [facility] INVASIVE CV LAB;  Service: Cardiovascular;  Laterality: Left;   LEFT HEART CATHETERIZATION WITH CORONARY ANGIOGRAM N/A 01/11/2013   Procedure: LEFT HEART CATHETERIZATION WITH CORONARY ANGIOGRAM;  Surgeon: [name], MD;  Location: [facility] CATH LAB;  Service: Cardiovascular;  Laterality: N/A;   TOTAL KNEE ARTHROPLASTY  08/30/2011   Procedure: TOTAL KNEE ARTHROPLASTY;  Surgeon: Nilda Simmer, MD;  Location: [facility] OR;  Service: Orthopedics;  Laterality: Left;   Social History:  reports that she quit smoking about 28 years ago. Her smoking use included cigarettes. She started smoking about 43 years ago. She has a 30 pack-year smoking history. She has never used smokeless tobacco. She reports that she does not drink alcohol and does not use drugs.  Allergies  Allergen Reactions   Codeine Hives   Cymbalta [Duloxetine Hcl] Other (See Comments)    Altered mental status, Alopecia, visual hallucinations, nightmares   Gabapentin Swelling   Prolixin  Decanoate [Fluphenazine]     Ineffective    Benztropine Mesylate Other (See Comments)   Buprenorphine Hcl Other (See Comments)    Unable to void   Morphine And Codeine Other (See Comments)    Unable to void   Nsaids     REACTION: palpitations, diaphoresis   Other Other (See Comments)    "Anesthesia makes me sick"   Risperidone And Related Other (See Comments)    tremors   Wellbutrin [Bupropion]     Constipation, mood swings   Benztropine Other (See Comments)    Hair fall out  Suicide thoughts   Carbidopa-Levodopa Nausea Only   Lyrica [Pregabalin] Other (See Comments)    Alopecia, visual hallucinations, nightmares Altered mental status   Nortripytline Hcl [Nortriptyline] Other (See Comments)    Hair loss and night mares    Penicillins Nausea And Vomiting and Other (See Comments)    REACTION: upset stomach Has patient had a PCN reaction causing immediate rash, facial/tongue/throat swelling, SOB or lightheadedness with hypotension: No Has patient had a PCN reaction causing severe rash involving mucus membranes or skin necrosis: No Has patient had a PCN reaction that required hospitalization: No Has patient had a PCN reaction occurring within the last 10 years:  No If all of the above answers are "NO", then may proceed with Cephalosporin use.   Tolmetin Other (See Comments) and Palpitations    REACTION: palpitations, diaphoresis    Family History  Problem Relation Age of Onset   Cancer Mother    Colon cancer Mother    Hypertension Father    Heart disease Father    Alcohol abuse Father    Breast cancer Sister     Prior to Admission medications   Medication Sig Start Date End Date Taking? Authorizing Provider  B-D ULTRAFINE III SHORT PEN 31G X 8 MM MISC Inject into the skin. 03/06/21  Yes [provider]  ACCU-CHEK GUIDE test strip  10/01/19   [provider]  acetaminophen (TYLENOL) 500 MG tablet Take 1,000 mg by mouth every 6 (six) hours as needed for  moderate pain or headache.  Patient not taking: Reported on 11/22/2022    [provider]  allopurinol (ZYLOPRIM) 300 MG tablet Take 300 mg by mouth daily.    [provider]  amitriptyline (ELAVIL) 50 MG tablet TAKE 1 AND 1/2 TABLETS AT BEDTIME 06/03/22   Jomarie Longs, MD  amLODipine (NORVASC) 5 MG tablet Take 5 mg by mouth daily.    [provider]  aspirin EC 81 MG tablet Take 81 mg by mouth daily. Swallow whole.    [provider]  buprenorphine (BUTRANS) 20 MCG/HR PTWK Place onto the skin. Patient not taking: Reported on 11/22/2022    [provider]  carbidopa-levodopa (SINEMET CR) 50-200 MG tablet Take 1 tablet by mouth at bedtime.    [provider]  carbidopa-levodopa (SINEMET IR) 25-100 MG tablet Take 1 tablet by mouth 3 (three) times daily. 11/10/21   [provider]  Cholecalciferol (VITAMIN D-3) 125 MCG (5000 UT) TABS Take 1,000 Units by mouth daily.    [provider]  hydrOXYzine (ATARAX) 50 MG tablet Take 0.5-1 tablets (25-50 mg total) by mouth 2 (two) times daily as needed. 01/12/23   Jomarie Longs, MD  insulin aspart (FIASP FLEXTOUCH) 100 UNIT/ML FlexTouch Pen Inject 10 Units into the skin daily in the afternoon. Takes 10 units at 10:00 am, 16 units at 4 pm, 10 units at 10 pm.    [provider]  insulin degludec (TRESIBA FLEXTOUCH) 100 UNIT/ML FlexTouch Pen Inject 38 Units into the skin daily. At 10:00 pm    [provider]  isosorbide mononitrate (IMDUR) 30 MG 24 hr tablet Take 30 mg by mouth daily.    [provider]  levothyroxine (SYNTHROID) 50 MCG tablet Take 50 mcg by mouth See admin instructions. Saturdays and Sundays only. Takes 75 MCG tab Monday-Friday 01/10/20 01/10/23  [provider]  levothyroxine (SYNTHROID) 75 MCG tablet Take 75 mcg by mouth See admin instructions. Monday-Friday. Takes 50 MCG tab on Saturdays and Sundays    [provider]  metFORMIN  (GLUCOPHAGE) 500 MG tablet Take 1,000 mg by mouth 2 (two) times daily with a meal. 08/19/20   [provider]  metoprolol tartrate (LOPRESSOR) 25 MG tablet Take 25 mg by mouth 2 (two) times daily.    [provider]  nitroGLYCERIN (NITRODUR - DOSED IN MG/24 HR) 0.4 mg/hr patch Place 0.4 mg onto the skin as needed. Patient not taking: Reported on 11/22/2022    [provider]  OLANZapine (ZYPREXA) 5 MG tablet Take 0.5 tablets (2.5 mg total) by mouth at bedtime. 03/16/22   Jomarie Longs, MD  Omega-3 Fatty Acids (FISH OIL) 1000 MG  CAPS Take 1 capsule by mouth daily.    [provider]  ondansetron (ZOFRAN) 8 MG tablet Take 8 mg by mouth in the morning, at noon, and at bedtime.    [provider]  oxyCODONE (OXY IR/ROXICODONE) 5 MG immediate release tablet Take 5-10 mg by mouth 2 (two) times daily as needed for moderate pain (pain score 4-6). 08/26/22   [provider]  pravastatin (PRAVACHOL) 40 MG tablet Take 40 mg by mouth daily. 08/19/20   [provider]  rOPINIRole (REQUIP) 0.5 MG tablet Take 0.5 mg by mouth 3 (three) times daily.    [provider]  SUMAtriptan (IMITREX) 100 MG tablet Take 100 mg by mouth every 2 (two) hours as needed for migraine. May repeat in 2 hours if headache persists or recurs.    [provider]  torsemide (DEMADEX) 20 MG tablet Take 20 mg by mouth every other day.    [provider]    Physical Exam: Vitals:   03/06/23 1118 03/06/23 1131  BP:  118/64  Pulse:  95  Resp:  16  Temp:  (!) 97.3 F (36.3 C)  TempSrc:  Oral  SpO2:  94%  Weight: 108.8 kg   Height: 5\' 5"  (1.651 m)    Physical Exam Vitals and nursing note reviewed.  Constitutional:      General: She is awake. She is not in acute distress.    Appearance: Normal appearance. She is obese. She is ill-appearing.  HENT:     Head: Normocephalic.     Nose: No rhinorrhea.     Mouth/Throat:     Mouth: Mucous membranes  are moist.  Eyes:     General: No scleral icterus.    Pupils: Pupils are equal, round, and reactive to light.  Neck:     Vascular: No JVD.  Cardiovascular:     Rate and Rhythm: Normal rate and regular rhythm.     Heart sounds: S1 normal and S2 normal.  Pulmonary:     Effort: Pulmonary effort is normal.     Breath sounds: Normal breath sounds. No wheezing, rhonchi or rales.  Abdominal:     General: Bowel sounds are normal. There is no distension.     Palpations: Abdomen is soft.     Tenderness: There is no abdominal tenderness. There is no right CVA tenderness or left CVA tenderness.  Musculoskeletal:     Cervical back: Neck supple.     Right lower leg: No edema.     Left lower leg: No edema.  Skin:    General: Skin is warm and dry.     Findings: Bruising and lesion present.  Neurological:     General: No focal deficit present.     Mental Status: She is alert. She is disoriented.  Psychiatric:        Mood and Affect: Mood normal.        Behavior: Behavior normal. Behavior is cooperative.    Data Reviewed:  Results are pending, will review when available.  EKG: Vent. rate 93 BPM PR interval 190 ms QRS duration 84 ms QT/QTcB 354/440 ms P-R-T axes 59 38 57 Normal sinus rhythm Nonspecific ST abnormality Abnormal ECG  Assessment and Plan: Principal Problem:   Aspiration pneumonia (HCC) Admit to PCU/inpatient. Continue supplemental oxygen. Scheduled and as needed bronchodilators. Continue ceftriaxone 2 g IVPB daily. Continue azithromycin 500 mg IVPB daily. Check strep pneumoniae urinary antigen. Check sputum Gram stain, culture and sensitivity. Follow-up blood culture and sensitivity.  Follow-up CBC and chemistry in the morning.  Active Problems:   Hematoma of occipital region of scalp A small parafalcine subdural hematoma  Discussed by ED with neurosurgery.   No further imaging recommended. Continue to monitor area. Analgesics as needed.    Acute myeloid  leukemia not having achieved remission (HCC) Hold venetoclax tablets.    Pancytopenia (HCC) Secondary to cancer treatment. Follow CBC in the morning.    Chronic constipation Continue supplemental magnesium.    Diabetic sensory peripheral neuropathy (Bilateral Lower Extremity) On amitriptyline 75 mg at bedtime. Continue olanzapine 2.5 mg p.o. bedtime. Okay I will call   GERD (gastroesophageal reflux disease) Antiacid, H2 blocker or PPI as needed.    Pure hypercholesterolemia Currently not on medical therapy. Follow-up with primary care provider.    Schizoaffective disorder, depressive type (HCC)   PTSD (post-traumatic stress disorder)   Depression, major, recurrent, moderate with psychotic features (HCC) Continue Zyprexa 2.5 mg p.o. bedtime. Continue amitriptyline 75 mg p.o. bedtime.     Generalized anxiety disorder   History of panic attacks Continue hydroxyzine as needed.    OSA (obstructive sleep apnea) CPAP at bedtime. Also on ropinirole 0.5 mg p.o. 3 times daily.    Class 2 obesity Current BMI 39.91 Kg/m. Lifestyle modifications. Follow-up with closely PCP and/or bariatric clinic.    Physical deconditioning Consult PT and OT tomorrow morning.    Non-insulin dependent type 2 diabetes mellitus (HCC) Carbohydrate modified diet. Continue metformin 500 mg p.o. twice daily. Continue Tresiba 38 units at bedtime. CBG monitoring with RI SS.    Drug-induced Parkinson's disease (HCC) Continue Sinemet IR 25-100 mg p.o. 3 times daily.    Class 2 obesity Current BMI  kg/m. Lifestyle modifications. Follow-up with PCP as an outpatient.   Advance Care Planning:   Code Status: Full Code   Consults:   Family Communication:   Severity of Illness: The appropriate patient status for this patient is INPATIENT. Inpatient status is judged to be reasonable and necessary in order to provide the required intensity of service to ensure the patient's safety. The patient's  presenting symptoms, physical exam findings, and initial radiographic and laboratory data in the context of their chronic comorbidities is felt to place them at high risk for further clinical deterioration. Furthermore, it is not anticipated that the patient will be medically stable for discharge from the hospital within 2 midnights of admission.   * I certify that at the point of admission it is my clinical judgment that the patient will require inpatient hospital care spanning beyond 2 midnights from the point of admission due to high intensity of service, high risk for further deterioration and high frequency of surveillance required.*  Author: Bobette Mo, MD 03/06/2023 2:22 PM  For on call review [email or web address].   This document was prepared using Dragon voice recognition software and may contain some unintended transcription errors.

## 2023-03-06 NOTE — Plan of Care (Signed)
Problem: Coping: Goal: Ability to adjust to condition or change in health will improve Outcome: Progressing   Problem: Fluid Volume: Goal: Ability to maintain a balanced intake and output will improve Outcome: Progressing   Problem: Skin Integrity: Goal: Risk for impaired skin integrity will decrease Outcome: Progressing

## 2023-03-06 NOTE — ED Provider Notes (Signed)
[place] EMERGENCY DEPARTMENT AT [facility] Provider Note   CSN: [id number] Arrival date & time: 03/06/23  1108     History  Chief Complaint  Patient presents with   Marletta Lor    Heather Haley is a 63 y.o. female past medical history significant for diabetes, hypertension, cancer presents today for a fall.  Patient states she remembers getting up around 6 AM to use the bathroom and then waking up on the floor.  Patient states that she has pain to her head and dizziness when sitting up.  Patient denies any pain anywhere else, cough, congestion, fever, shortness of breath, neck pain, abdominal pain, vision changes, numbness, or weakness.   Fall Associated symptoms include headaches.       Home Medications Prior to Admission medications   Medication Sig Start Date End Date Taking? Authorizing Provider  ACCU-CHEK GUIDE test strip  10/01/19   [provider]  acetaminophen (TYLENOL) 500 MG tablet Take 1,000 mg by mouth every 6 (six) hours as needed for moderate pain or headache.  Patient not taking: Reported on 11/22/2022    [provider]  albuterol (PROVENTIL HFA;VENTOLIN HFA) 108 (90 Base) MCG/ACT inhaler Inhale 2 puffs into the lungs every 6 (six) hours as needed for wheezing or shortness of breath. Patient not taking: Reported on 11/22/2022    [provider]  Alcohol Swabs (B-D SINGLE USE SWABS REGULAR) PADS  05/29/18   [provider]  amantadine (SYMMETREL) 100 MG capsule Take 75 mg by mouth once.    [provider]  amitriptyline (ELAVIL) 50 MG tablet TAKE 1 AND 1/2 TABLETS AT BEDTIME 06/03/22   Jomarie Longs, MD  aspirin EC 81 MG tablet Take 81 mg by mouth daily. Swallow whole.    [provider]  B-D ULTRAFINE III SHORT PEN 31G X 8 MM MISC Inject into the skin. Patient not taking: Reported on 11/22/2022 03/06/21   [provider]  Blood Glucose Calibration (ACCU-CHEK AVIVA) SOLN  05/29/18    [provider]  Blood Glucose Monitoring Suppl (ACCU-CHEK AVIVA PLUS) w/Device KIT  05/29/18   [provider]  buprenorphine (BUTRANS) 20 MCG/HR PTWK Place onto the skin. Patient not taking: Reported on 11/22/2022    [provider]  butalbital-acetaminophen-caffeine (FIORICET) [phone] MG tablet  11/03/20   [provider]  carbidopa-levodopa (SINEMET CR) 50-200 MG tablet Take 1 tablet by mouth at bedtime.    [provider]  carbidopa-levodopa (SINEMET IR) 25-100 MG tablet Take 1 tablet by mouth 3 (three) times daily. 11/10/21   [provider]  Cholecalciferol (VITAMIN D-3) 125 MCG (5000 UT) TABS Take 1,000 Units by mouth daily.    [provider]  furosemide (LASIX) 40 MG tablet Take 40 mg by mouth as directed. Takes every other day Patient not taking: Reported on 11/22/2022    [provider]  hydrOXYzine (ATARAX) 50 MG tablet Take 0.5-1 tablets (25-50 mg total) by mouth 2 (two) times daily as needed. 01/12/23   Jomarie Longs, MD  insulin aspart (FIASP FLEXTOUCH) 100 UNIT/ML FlexTouch Pen Inject 10 Units into the skin daily in the afternoon. Takes 10 units at 10:00 am, 16 units at 4 pm, 10 units at 10 pm.    [provider]  insulin degludec (TRESIBA FLEXTOUCH) 100 UNIT/ML FlexTouch Pen Inject 38 Units into the skin daily. At 10:00 pm    [provider]  levothyroxine (SYNTHROID) 50 MCG tablet Take by mouth. 01/10/20 01/10/23  [provider]  metFORMIN (GLUCOPHAGE) 500 MG tablet Take 1,000 mg by mouth daily with breakfast. 08/19/20   [provider]  metoprolol tartrate (LOPRESSOR) 25 MG tablet Take 25 mg by mouth 2 (two) times daily.    [provider]  nitroGLYCERIN (NITRODUR - DOSED IN MG/24 HR) 0.4 mg/hr patch Place 0.4 mg onto the skin as needed. Patient not taking: Reported on 11/22/2022    [provider]  OLANZapine (ZYPREXA) 5 MG tablet Take 0.5 tablets (2.5 mg  total) by mouth at bedtime. 03/16/22   Jomarie Longs, MD  Omega-3 Fatty Acids (FISH OIL) 1000 MG CAPS Take by mouth.    [provider]  omeprazole (PRILOSEC) 40 MG capsule     [provider]  ondansetron (ZOFRAN) 4 MG tablet Take 4 mg by mouth as needed for nausea or vomiting. Patient not taking: Reported on 11/22/2022    [provider]  oxyCODONE (OXY IR/ROXICODONE) 5 MG immediate release tablet Take 5-10 mg by mouth daily as needed. 08/26/22   [provider]  pravastatin (PRAVACHOL) 40 MG tablet Take by mouth. 08/19/20   [provider]  rizatriptan (MAXALT) 10 MG tablet Take 10 mg by mouth 2 (two) times daily as needed for migraine. May repeat in 2 hours if needed Patient not taking: Reported on 11/22/2022    [provider]  rOPINIRole (REQUIP) 0.5 MG tablet Take 0.5 mg by mouth 3 (three) times daily.    [provider]  SUMAtriptan (IMITREX) 100 MG tablet Take 100 mg by mouth every 2 (two) hours as needed for migraine. May repeat in 2 hours if headache persists or recurs.    [provider]  torsemide (DEMADEX) 20 MG tablet Take 20 mg by mouth every other day.    [provider]      Allergies    Prolixin decanoate [fluphenazine], Benztropine, Benztropine mesylate, Buprenorphine hcl, Carbidopa-levodopa, Codeine, Cymbalta [duloxetine hcl], Gabapentin, Lyrica [pregabalin], Morphine and codeine, Nortripytline hcl [nortriptyline], Nsaids, Other, Penicillins, Risperidone and related, Wellbutrin [bupropion], and Tolmetin    Review of Systems   Review of Systems  Neurological:  Positive for dizziness and headaches.    Physical Exam Updated Vital Signs BP 118/64 (BP Location: Left Arm)   Pulse 95   Temp (!) 97.3 F (36.3 C) (Oral)   Resp 16   Ht 5\' 5"  (1.651 m)   Wt 108.8 kg   SpO2 94%   BMI 39.91 kg/m  Physical Exam Vitals and nursing note reviewed.  Constitutional:      General: She is awake. She is  not in acute distress.    Appearance: She is well-developed.  HENT:     Head: Normocephalic and atraumatic. No raccoon eyes, Battle's sign or abrasion.      Comments: Patient has a large hematoma to the right side of her head    Right Ear: External ear normal.     Left Ear: External ear normal.  Eyes:     General: Vision grossly intact. Gaze aligned appropriately.     Extraocular Movements: Extraocular movements intact.     Conjunctiva/sclera: Conjunctivae normal.  Cardiovascular:     Rate and Rhythm: Normal rate and regular rhythm.     Pulses: Normal pulses.     Heart sounds: Normal heart sounds. No murmur heard. Pulmonary:     Effort: Pulmonary effort is normal. No respiratory distress.     Breath sounds: Normal breath sounds.  Abdominal:     Palpations: Abdomen is soft.  Tenderness: There is no abdominal tenderness.  Musculoskeletal:        General: No swelling, tenderness or deformity. Normal range of motion.     Cervical back: Normal range of motion and neck supple. No rigidity or tenderness.     Right lower leg: No edema.     Left lower leg: No edema.  Skin:    General: Skin is warm and dry.     Capillary Refill: Capillary refill takes less than 2 seconds.  Neurological:     General: No focal deficit present.     Motor: No weakness.  Psychiatric:        Mood and Affect: Mood normal.        Behavior: Behavior is cooperative.     ED Results / Procedures / Treatments   Labs (all labs ordered are listed, but only abnormal results are displayed) Labs Reviewed  BASIC METABOLIC PANEL - Abnormal; Notable for the following components:      Result Value   Sodium 129 (*)    Chloride 90 (*)    Glucose, Bld 296 (*)    Calcium 8.1 (*)    All other components within normal limits  CBC WITH DIFFERENTIAL/PLATELET - Abnormal; Notable for the following components:   WBC 1.1 (*)    RBC 2.92 (*)    Hemoglobin 8.8 (*)    HCT 28.4 (*)    RDW 18.8 (*)    Platelets 62 (*)     nRBC 3.8 (*)    Neutro Abs 0.9 (*)    Lymphs Abs 0.1 (*)    Monocytes Absolute 0.0 (*)    All other components within normal limits  CK  CBC WITH DIFFERENTIAL/PLATELET  URINALYSIS, ROUTINE W REFLEX MICROSCOPIC  TROPONIN I (HIGH SENSITIVITY)  TROPONIN I (HIGH SENSITIVITY)    EKG None  Radiology CT Head Wo Contrast Addendum Date: 03/06/2023 ADDENDUM REPORT: 03/06/2023 13:12 ADDENDUM: Study discussed by telephone with PA Lakesha Levinson on 03/06/2023 at 1251 hours. Electronically Signed   By: Odessa Fleming M.D.   On: 03/06/2023 13:12   Result Date: 03/06/2023 CLINICAL DATA:  63 year old female status post fall, found down. Posterior head hematoma. EXAM: CT HEAD WITHOUT CONTRAST TECHNIQUE: Contiguous axial images were obtained from the base of the skull through the vertex without intravenous contrast. RADIATION DOSE REDUCTION: This exam was performed according to the departmental dose-optimization program which includes automated exposure control, adjustment of the mA and/or kV according to patient size and/or use of iterative reconstruction technique. COMPARISON:  Brain MRI 09/06/2020.  Head CT 06/09/2016. FINDINGS: Brain: Small volume posterior para falcine subdural blood on series 6, image 61 measures up to 3 mm. No other acute intracranial hemorrhage identified. No IVH or ventriculomegaly. No midline shift, mass effect, or evidence of intracranial mass lesion. No cortically based acute infarct identified. Stable gray-white matter differentiation throughout the brain. Vascular: No suspicious intracranial vascular hyperdensity. Skull: No fracture identified. Sinuses/Orbits: Visualized paranasal sinuses and mastoids are stable and well aerated. Other: Broad-based right posterior and superior scalp hematoma, up to 2 cm in thickness. Underlying calvarium appears stable and intact. Visualized orbit soft tissues are within normal limits. Traumatic Brain Injury Risk Stratification Skull Fracture: No - Low/mBIG 1  Subdural Hematoma (SDH): <53mm - mBIG 1 Subarachnoid Hemorrhage [facility]): No Epidural Hematoma (EDH): No - Low/mBIG 1 Cerebral contusion, intra-axial, intraparenchymal Hemorrhage (IPH): No Intraventricular Hemorrhage (IVH): No - Low/mBIG 1 Midline Shift > 1mm or Edema/effacement of sulci/vents: No - Low/mBIG 1 ---------------------------------------------------- IMPRESSION: 1.  Large posterior scalp hematoma with no underlying skull fracture, but Positive for a small para-falcine Subdural Hematoma, up to 3 mm. 2. No intracranial mass effect. No other acute intracranial abnormality. Electronically Signed: By: Odessa Fleming M.D. On: 03/06/2023 12:47   DG Chest 2 View Result Date: 03/06/2023 CLINICAL DATA:  63 year old female status post fall, found down. Posterior head hematoma. EXAM: CHEST - 2 VIEW COMPARISON:  CT Cervical spine today, Chest radiographs 01/23/2023. FINDINGS: Semi upright AP and lateral views at 1223 hours. Confluent airspace opacity, evidence of consolidation in the left lower lobe on both views. Small pleural effusion difficult to exclude. Similar lung volumes to last month. Mediastinal contours are stable and within normal limits. Visualized tracheal air column is within normal limits. The right lung appears to remain clear. No acute osseous abnormality identified. Negative visible bowel gas. Abdomen Calcified aortic atherosclerosis. IMPRESSION: Left lower lobe consolidation, new from last month. Consider Aspiration and/or Pneumonia in this setting. Small left pleural effusion is possible. Electronically Signed   By: Odessa Fleming M.D.   On: 03/06/2023 12:54   CT Cervical Spine Wo Contrast Result Date: 03/06/2023 CLINICAL DATA:  63 year old female status post fall, found down. Posterior head hematoma. EXAM: CT CERVICAL SPINE WITHOUT CONTRAST TECHNIQUE: Multidetector CT imaging of the cervical spine was performed without intravenous contrast. Multiplanar CT image reconstructions were also generated. RADIATION  DOSE REDUCTION: This exam was performed according to the departmental dose-optimization program which includes automated exposure control, adjustment of the mA and/or kV according to patient size and/or use of iterative reconstruction technique. COMPARISON:  Head CT today reported separately.  Neck CT 03/27/2020. FINDINGS: Alignment: Improved cervical lordosis compared to 2022. Cervicothoracic junction alignment is within normal limits. Bilateral posterior element alignment is within normal limits. Skull base and vertebrae: Mild motion artifact. Bone mineralization is within normal limits. Visualized skull base is intact. No atlanto-occipital dissociation. C1 and C2 appear intact and aligned. No acute osseous abnormality identified. Soft tissues and spinal canal: No prevertebral fluid or swelling. No visible canal hematoma. Negative visible noncontrast neck soft tissues. Disc levels: Chronic C5-C6 and C6-C7 disc and endplate degeneration does not appear significantly changed from 2022. Upper chest: Mild motion artifact, otherwise negative. IMPRESSION: 1. No acute traumatic injury identified in the cervical spine. 2. Chronic C5-C6 and C6-C7 disc and endplate degeneration. Electronically Signed   By: Odessa Fleming M.D.   On: 03/06/2023 12:50    Procedures Procedures    Medications Ordered in ED Medications - No data to display  ED Course/ Medical Decision Making/ A&P                                 Medical Decision Making Amount and/or Complexity of Data Reviewed Labs: ordered. Radiology: ordered.   This patient presents to the ED with chief complaint(s) of fall with head injury with pertinent past medical history of diabetes, hypertension, cancer which further complicates the presenting complaint. The complaint involves an extensive differential diagnosis and also carries with it a high risk of complications and morbidity.    The differential diagnosis includes brain bleed, concussion, electrolyte  abnormality, arrhythmia, ACS, hypoglycemia  Additional history obtained: Records reviewed Care Everywhere/External Records  ED Course and Reassessment:   Independent labs interpretation:  The following labs were independently interpreted:  CBC: Leukopenia 1.1, anemia at 8.8 BMP: Mildly decreased sodium at 129, decreased chloride at 90, decreased calcium at 8.1 CK: 56 Troponin:  5 EKG: Normal sinus rhythm  Independent visualization of imaging: - I independently visualized the following imaging with scope of interpretation limited to determining acute life threatening conditions related to emergency care:  CT head and C-spine Noncon: Large posterior scalp hematoma with no underlying skull fracture.  Positive for small parafalcine subdural hematoma up to 3 mm, no intracranial mass effect no other acute intracranial abnormality.  No acute traumatic injury identified in the C-spine. Chest x-ray: Left lower lobe consolidation, new from last month.  Consider aspiration and/or pneumonia.  Consultation: - Consulted or discussed management/test interpretation w/ external professional: Neurosurgery, Dr. Conchita Paris there was no intervention/follow-up from neurosurgery standpoint at this time.  Hospitalist, Dr. Robb Matar who is agreeable to admission for aspiration pneumonia.  Consideration for admission or further workup: Patient being admitted for aspiration pneumonia.        Final Clinical Impression(s) / ED Diagnoses Final diagnoses:  Aspiration pneumonia of left lower lobe, unspecified aspiration pneumonia type [facility])    Rx / DC Orders ED Discharge Orders     None         Dolphus Jenny, PA-C 03/06/23 1403    Virgina Norfolk, DO 03/06/23 1412

## 2023-03-06 NOTE — ED Triage Notes (Signed)
Per ems pt reports remembering getting up around 6am to use the bathroom, and then waking up on the floor, hematoma noted to back of the head, denies blood thinners, hx of DM, HTN and cancer

## 2023-03-07 ENCOUNTER — Inpatient Hospital Stay (HOSPITAL_COMMUNITY): Payer: Medicare HMO

## 2023-03-07 ENCOUNTER — Encounter (HOSPITAL_COMMUNITY): Payer: Self-pay | Admitting: Internal Medicine

## 2023-03-07 DIAGNOSIS — J690 Pneumonitis due to inhalation of food and vomit: Secondary | ICD-10-CM | POA: Diagnosis not present

## 2023-03-07 LAB — CBC
HCT: 27.7 % — ABNORMAL LOW (ref 36.0–46.0)
Hemoglobin: 8.3 g/dL — ABNORMAL LOW (ref 12.0–15.0)
MCH: 30.3 pg (ref 26.0–34.0)
MCHC: 30 g/dL (ref 30.0–36.0)
MCV: 101.1 fL — ABNORMAL HIGH (ref 80.0–100.0)
Platelets: 63 10*3/uL — ABNORMAL LOW (ref 150–400)
RBC: 2.74 MIL/uL — ABNORMAL LOW (ref 3.87–5.11)
RDW: 19.3 % — ABNORMAL HIGH (ref 11.5–15.5)
WBC: 0.6 10*3/uL — CL (ref 4.0–10.5)
nRBC: 17.2 % — ABNORMAL HIGH (ref 0.0–0.2)

## 2023-03-07 LAB — BLOOD GAS, VENOUS
Acid-Base Excess: 12.8 mmol/L — ABNORMAL HIGH (ref 0.0–2.0)
Bicarbonate: 37.5 mmol/L — ABNORMAL HIGH (ref 20.0–28.0)
Drawn by: 7020
O2 Saturation: 31.9 %
Patient temperature: 36.8
pCO2, Ven: 47 mm[Hg] (ref 44–60)
pH, Ven: 7.51 — ABNORMAL HIGH (ref 7.25–7.43)
pO2, Ven: <31 mm[Hg] — CL (ref 32–45)

## 2023-03-07 LAB — COMPREHENSIVE METABOLIC PANEL
ALT: 9 U/L (ref 0–44)
AST: 22 U/L (ref 15–41)
Albumin: 2.6 g/dL — ABNORMAL LOW (ref 3.5–5.0)
Alkaline Phosphatase: 82 U/L (ref 38–126)
Anion gap: 12 (ref 5–15)
BUN: 14 mg/dL (ref 8–23)
CO2: 27 mmol/L (ref 22–32)
Calcium: 8.1 mg/dL — ABNORMAL LOW (ref 8.9–10.3)
Chloride: 91 mmol/L — ABNORMAL LOW (ref 98–111)
Creatinine, Ser: 0.7 mg/dL (ref 0.44–1.00)
GFR, Estimated: >60 mL/min (ref >60)
Glucose, Bld: 146 mg/dL — ABNORMAL HIGH (ref 70–99)
Potassium: 3.9 mmol/L (ref 3.5–5.1)
Sodium: 130 mmol/L — ABNORMAL LOW (ref 135–145)
Total Bilirubin: 1.6 mg/dL — ABNORMAL HIGH (ref 0.0–1.2)
Total Protein: 7.4 g/dL (ref 6.5–8.1)

## 2023-03-07 LAB — RESPIRATORY PANEL BY PCR

## 2023-03-07 LAB — FOLATE: Folate: 12.5 ng/mL (ref >5.9)

## 2023-03-07 LAB — DIFFERENTIAL
Abs Immature Granulocytes: 0.03 10*3/uL (ref 0.00–0.07)
Basophils Absolute: 0 10*3/uL (ref 0.0–0.1)
Basophils Relative: 0 %
Eosinophils Absolute: 0 10*3/uL (ref 0.0–0.5)
Eosinophils Relative: 0 %
Immature Granulocytes: 5 %
Lymphocytes Relative: 29 %
Lymphs Abs: 0.2 10*3/uL — ABNORMAL LOW (ref 0.7–4.0)
Monocytes Absolute: 0.1 10*3/uL (ref 0.1–1.0)
Monocytes Relative: 8 %
Neutro Abs: 0.4 10*3/uL — CL (ref 1.7–7.7)
Neutrophils Relative %: 58 %

## 2023-03-07 LAB — SARS CORONAVIRUS 2 BY RT PCR: SARS Coronavirus 2 by RT PCR: NEGATIVE

## 2023-03-07 LAB — GLUCOSE, CAPILLARY
Glucose-Capillary: 139 mg/dL — ABNORMAL HIGH (ref 70–99)
Glucose-Capillary: 125 mg/dL — ABNORMAL HIGH (ref 70–99)
Glucose-Capillary: 134 mg/dL — ABNORMAL HIGH (ref 70–99)
Glucose-Capillary: 167 mg/dL — ABNORMAL HIGH (ref 70–99)

## 2023-03-07 LAB — VITAMIN B12: Vitamin B-12: 623 pg/mL (ref 180–914)

## 2023-03-07 LAB — MRSA NEXT GEN BY PCR, NASAL: MRSA by PCR Next Gen: NOT DETECTED

## 2023-03-07 LAB — STREP PNEUMONIAE URINARY ANTIGEN: Strep Pneumo Urinary Antigen: NEGATIVE

## 2023-03-07 LAB — AMMONIA: Ammonia: 14 umol/L (ref 9–35)

## 2023-03-07 LAB — TSH: TSH: 0.798 u[IU]/mL (ref 0.350–4.500)

## 2023-03-07 MED ORDER — PIPERACILLIN-TAZOBACTAM 3.375 G IVPB
3.3750 g | Freq: Three times a day (TID) | INTRAVENOUS | Status: DC
  Administered 2023-03-07 – 2023-03-10 (×11): 3.375 g via INTRAVENOUS
  Filled 2023-03-07 (×11): qty 50

## 2023-03-07 MED ORDER — ORAL CARE MOUTH RINSE: 15.0000 mL | OROMUCOSAL | Status: DC | PRN

## 2023-03-07 MED ORDER — ACETAMINOPHEN 500 MG PO TABS
1000.0000 mg | ORAL_TABLET | Freq: Three times a day (TID) | ORAL | Status: AC
  Administered 2023-03-07 – 2023-03-08 (×5): 1000 mg via ORAL
  Filled 2023-03-07 (×5): qty 2

## 2023-03-07 MED ORDER — LIDOCAINE 5 % EX PTCH
1.0000 | MEDICATED_PATCH | CUTANEOUS | Status: DC
Start: 2023-03-07 — End: 2023-03-11
  Administered 2023-03-07: 1 via TRANSDERMAL
  Filled 2023-03-07 (×4): qty 3

## 2023-03-07 MED ORDER — IOHEXOL 300 MG/ML  SOLN: 15.0000 mL | INTRAMUSCULAR | Status: AC

## 2023-03-07 MED ORDER — VANCOMYCIN HCL 2000 MG/400ML IV SOLN
2000.0000 mg | Freq: Once | INTRAVENOUS | Status: AC
  Administered 2023-03-07: 2000 mg via INTRAVENOUS
  Filled 2023-03-07: qty 400

## 2023-03-07 MED ORDER — BUPRENORPHINE 10 MCG/HR TD PTWK
2.0000 | MEDICATED_PATCH | TRANSDERMAL | Status: DC
  Administered 2023-03-07: 2 via TRANSDERMAL
  Filled 2023-03-07: qty 2

## 2023-03-07 MED ORDER — ACETAMINOPHEN 325 MG PO TABS
650.0000 mg | ORAL_TABLET | Freq: Four times a day (QID) | ORAL | Status: DC | PRN
  Administered 2023-03-09 – 2023-03-10 (×3): 650 mg via ORAL
  Filled 2023-03-07 (×3): qty 2

## 2023-03-07 MED ORDER — ORAL CARE MOUTH RINSE
15.0000 mL | OROMUCOSAL | Status: DC
  Administered 2023-03-07 – 2023-03-10 (×14): 15 mL via OROMUCOSAL

## 2023-03-07 MED ORDER — VANCOMYCIN HCL 1500 MG/300ML IV SOLN: 1500.0000 mg | INTRAVENOUS | Status: DC

## 2023-03-07 MED ORDER — OXYCODONE HCL 5 MG PO TABS
2.5000 mg | ORAL_TABLET | Freq: Four times a day (QID) | ORAL | Status: DC | PRN
  Administered 2023-03-07 – 2023-03-10 (×7): 2.5 mg via ORAL
  Filled 2023-03-07 (×9): qty 1

## 2023-03-07 NOTE — Progress Notes (Signed)
OT Cancellation Note  Patient Details Name: Heather Haley MRN: [id number] DOB: 30-Mar-1960   Cancelled Treatment:    Reason Eval/Treat Not Completed: Other (comment) Spoke with MD who informed OT that he will be cancelling therapy orders on this patient at this time. Will sign off.   Limmie Patricia, OTR/L,CBIS  Supplemental OT - [facility] and [facility] Secure Chat Preferred   03/07/2023, 12:01 PM

## 2023-03-07 NOTE — Progress Notes (Signed)
Chaplain received a consult that Heather Haley's family was requesting information about HCPOA.  Family was not present when chaplain arrived.  Heather Haley is confused and does not have capacity at this time to make legal decisions. Chaplain left paperwork in the room, and explained this to nurse so that she can communicate that to family. If they have any additional questions for chaplain, please page or consult [place] again.  [address], [place] Pager, [phone]

## 2023-03-07 NOTE — TOC Initial Note (Signed)
Transition of Care [facility]) - Initial/Assessment Note   Patient Details  Name: Heather Haley MRN: [id number] Date of Birth: 07/27/1960  [facility]) CM/SW Contact:    Ewing Schlein, LCSW Phone Number: 03/07/2023, 9:09 AM  Clinical Narrative: Patient is from home with children. Patient is currently on 3L/min oxygen and IV antibiotics. PT and OT have been consulted. TOC following for discharge needs.                 Expected Discharge Plan:  (TBD) Barriers to Discharge: Continued Medical Work up  Expected Discharge Plan and Services In-house Referral: Clinical Social Work Living arrangements for the past 2 months: Single Family Home  Prior Living Arrangements/Services Living arrangements for the past 2 months: Single Family Home Lives with:: Adult Children Patient language and need for interpreter reviewed:: Yes Do you feel safe going back to the place where you live?: Yes      Need for Family Participation in Patient Care: Yes (Comment) (Patient currently oriented x2.) Care giver support system in place?: Yes (comment) Criminal Activity/Legal Involvement Pertinent to Current Situation/Hospitalization: No - Comment as needed  Activities of Daily Living ADL Screening (condition at time of admission) Independently performs ADLs?: No Does the patient have a NEW difficulty with bathing/dressing/toileting/self-feeding that is expected to last >3 days?: Yes (Initiates electronic notice to provider for possible OT consult) Does the patient have a NEW difficulty with getting in/out of bed, walking, or climbing stairs that is expected to last >3 days?: Yes (Initiates electronic notice to provider for possible PT consult) Does the patient have a NEW difficulty with communication that is expected to last >3 days?: Yes (Initiates electronic notice to provider for possible SLP consult) Is the patient deaf or have difficulty hearing?: No Does the patient have difficulty seeing, even  when wearing glasses/contacts?: Yes Does the patient have difficulty concentrating, remembering, or making decisions?: Yes  Emotional Assessment Orientation: : Oriented to Self, Oriented to Place Alcohol / Substance Use: Not Applicable Psych Involvement: No (comment)  Admission diagnosis:  Aspiration pneumonia (HCC) [J69.0] Aspiration pneumonia of left lower lobe, unspecified aspiration pneumonia type (HCC) [J69.0] Patient Active Problem List   Diagnosis Date Noted   Aspiration pneumonia (HCC) 03/06/2023   Pancytopenia (HCC) 03/06/2023   Class 2 obesity 03/06/2023   Physical deconditioning 03/06/2023   Hematoma of occipital region of scalp 03/06/2023   Acute myeloid leukemia not having achieved remission (HCC) 01/28/2023   Normocytic anemia 01/10/2023   Thrombocytopenia (HCC) 01/10/2023   Neutropenia (HCC) 12/20/2022   MDS (myelodysplastic syndrome) (HCC) 11/22/2022   OSA (obstructive sleep apnea) 11/16/2022   Elevated blood pressure reading 12/08/2020   High risk medication use 06/13/2020   At risk for prolonged QT interval syndrome 03/26/2020   Insomnia due to medical condition 03/26/2020   Sacrococcygeal disorders, not elsewhere classified 03/03/2020   Pain in wrist 03/03/2020   Myofascial pain 03/03/2020   Contracture of joint of hand 03/03/2020   Carpal tunnel syndrome 03/03/2020   Intractable chronic migraine without aura and without status migrainosus 07/26/2019   Peripheral edema 07/23/2019   Peroneal tendinitis 07/04/2019   Morbid obesity with BMI of 40.0-44.9, adult (HCC) 01/11/2019   No-show for appointment 01/02/2019   Drug-induced Parkinson's disease (HCC) 10/09/2018   Involuntary commitment 08/30/2018   Schizophrenia (HCC) 08/22/2018   Schizoaffective disorder, depressive type (HCC) 08/17/2018   PTSD (post-traumatic stress disorder) 08/17/2018   S/P cardiac catheterization 11/18/2017   Heart palpitations 10/03/2017   Asthma,  stable, moderate persistent  09/23/2017   Weight gain due to medication 08/27/2017   MDD (major depressive disorder), recurrent, severe, with psychosis (HCC) 03/03/2017   Postmenopausal 10/15/2016   CKD (chronic kidney disease) stage 2, GFR 60-89 ml/min 10/14/2016   Muscle spasms of neck 10/14/2016   Unintended weight gain 10/14/2016   Contracture, tendon sheath 03/23/2016   Medication overuse headache 11/12/2015   Tremor 11/12/2015   Dysuria 06/17/2015   Pure hypercholesterolemia 05/29/2015   Opioid-induced constipation (OIC) 05/19/2015   Osteoarthritis of hip (Right) 05/08/2015   Chronic hip pain (Right) 05/05/2015   Long term prescription opiate use 05/05/2015   Chronic knee pain (S/P TKR:Total Knee Replacement) (Left) 05/05/2015   Osteoarthritis of hips (Location of Secondary source of pain) (Bilateral) (Right) 05/05/2015   History of total knee replacement (Left) 05/05/2015   Chronic groin pain (Location of Secondary source of pain) (Right) 05/05/2015   Chronic lower extremity pain (Location of Tertiary source of pain) (Bilateral) (R>L) 05/05/2015   Chronic pain 02/05/2015   Chronic ankle pain (secondary to a work-related injury) (Date of injury 04/17/2006) (Left) 12/25/2014   Chronic low back pain (Location of Primary Pain) (Bilateral) (R>L) 12/25/2014   Lumbar spondylosis (L3-4 & L4-5) 12/25/2014   Lumbar facet syndrome (Location of Primary Source of Pain) (Bilateral) (R>L) 12/25/2014   Encounter for therapeutic drug level monitoring 11/19/2014   Encounter for long-term opiate analgesic use 11/19/2014   Long-term current use of opiate analgesic 11/19/2014   Uncomplicated opioid dependence (HCC) 11/19/2014   Opiate use (35 MME/Day) 11/19/2014   Chronic pain syndrome 11/19/2014   Diabetic sensory peripheral neuropathy (Bilateral Lower Extremity) 11/19/2014   Generalized anxiety disorder 11/19/2014   History of panic attacks 11/19/2014   Insomnia 11/19/2014   History of tobacco abuse 11/19/2014   GERD  (gastroesophageal reflux disease) 11/19/2014   Non-insulin dependent type 2 diabetes mellitus (HCC) 11/19/2014   Coccygeal pain 11/19/2014   History of migraine 11/19/2014   Chronic constipation 10/24/2013   Personal history of other diseases of the digestive system 10/24/2013   History of gastroesophageal reflux (GERD) 10/24/2013   Atypical chest pain 01/11/2013    Class: Acute   Leg length inequality 09/12/2012   Morbid obesity (HCC) 05/26/2012   Hypertension    IBS (irritable bowel syndrome)    Depression, major, recurrent, moderate (HCC)    ADVERSE DRUG REACTION 12/15/2009   PCP:  Patient, No Pcp Per Pharmacy:   [facility] - [place], [place] - [address] [place] [address] Phone: [phone] Fax: [phone]  [facility] ([facility]) - [address] [place], [place] - [address] [place] [address] Phone: [phone] Fax: [phone]  Social Drivers of Health (SDOH) Social History: SDOH Screenings   Food Insecurity: No Food Insecurity (03/06/2023)  Housing: Low Risk  (03/06/2023)  Transportation Needs: No Transportation Needs (03/06/2023)  Utilities: Not At Risk (03/06/2023)  Alcohol Screen: Low Risk  (08/30/2018)  Depression (PHQ2-9): Low Risk  (11/22/2022)  Recent Concern: Depression (PHQ2-9) - Medium Risk (09/08/2022)  Financial Resource Strain: Medium Risk (01/27/2023)   Received from [facility]  Physical Activity: Inactive (01/27/2023)   Received from [facility]  Social Connections: Socially Isolated (03/06/2023)  Stress: Stress Concern Present (01/27/2023)   Received from [facility]  Tobacco Use: Medium Risk (03/07/2023)   SDOH Interventions: Social Connections Interventions: Intervention Not Indicated, Inpatient TOC (Patient is not socially isolated.)  Readmission Risk Interventions    03/07/2023    9:05 AM  Readmission Risk Prevention  Plan  Transportation Screening Complete  Medication Review (RN Care Manager) Complete  HRI or Home Care Consult Complete  SW Recovery Care/Counseling Consult Complete  Palliative Care Screening Not Applicable  Skilled Nursing Facility Not Applicable

## 2023-03-07 NOTE — Progress Notes (Signed)
Pharmacy Antibiotic Note  Heather Haley is a 63 y.o. female admitted on 03/06/2023 with pneumonia.  Pharmacy has been consulted for vancomycin dosing.  Plan: Vancomycin 2000 mg IV x1 then 1500 mg IV q24h ( AUC 474, Scr rounded to 0.8, wt 108.8 kg)  Zosyn 3.375 gr IV q8h EI.  Will order daily Scr while on combo Monitor clinical course, renal function, cultures as available    Height: 5\' 5"  (165.1 cm) Weight: 108.8 kg (239 lb 13.8 oz) IBW/kg (Calculated) : 57  Temp (24hrs), Avg:98.9 F (37.2 C), Min:97.8 F (36.6 C), Max:102.4 F (39.1 C)  Recent Labs  Lab 03/06/23 1140 03/07/23 0456  WBC 1.1* 0.6*  CREATININE 0.82 0.70    Estimated Creatinine Clearance: 88.3 mL/min (by C-G formula based on SCr of 0.7 mg/dL).    Allergies  Allergen Reactions   Codeine Hives   Cymbalta [Duloxetine Hcl] Other (See Comments)    Altered mental status, Alopecia, visual hallucinations, nightmares   Gabapentin Swelling   Nsaids Anaphylaxis    REACTION: palpitations, diaphoresis  Reaction: Diaphoresis / Sweating (intolerance); AND CHEST PAINS   Other Reaction(s): Diaphoresis / Sweating (intolerance)    Reaction: Diaphoresis / Sweating (intolerance); AND CHEST PAINS     AND CHEST PAINS    "feel like I'm having a heart attack"   Prolixin Decanoate [Fluphenazine]     Ineffective    Benztropine Mesylate Other (See Comments)   Buprenorphine Hcl Other (See Comments)    Unable to void   Morphine And Codeine Other (See Comments)    Unable to void   Other Other (See Comments)    "Anesthesia makes me sick"   Risperidone And Related Other (See Comments)    tremors   Wellbutrin [Bupropion]     Constipation, mood swings   Benztropine Other (See Comments)    Hair fall out  Suicide thoughts   Carbidopa-Levodopa Nausea Only   Lyrica [Pregabalin] Other (See Comments)    Alopecia, visual hallucinations, nightmares Altered mental status   Nortripytline Hcl [Nortriptyline] Other (See  Comments)    Hair loss and night mares    Penicillins Nausea And Vomiting and Other (See Comments)    REACTION: upset stomach Has patient had a PCN reaction causing immediate rash, facial/tongue/throat swelling, SOB or lightheadedness with hypotension: No Has patient had a PCN reaction causing severe rash involving mucus membranes or skin necrosis: No Has patient had a PCN reaction that required hospitalization: No Has patient had a PCN reaction occurring within the last 10 years: No If all of the above answers are "NO", then may proceed with Cephalosporin use.   Tolmetin Other (See Comments) and Palpitations    REACTION: palpitations, diaphoresis    Antimicrobials this admission: 1/26 azithromycin x1  1/26 ceftriaxone x1  1/27 vancomycin >>  1/27 Zosyn >>   Dose adjustments this admission:   Microbiology results: 1/27 BCx:  1/27 UCx:   Sputum:  MRSA PCR:    Adalberto Cole, PharmD, BCPS 03/07/2023 1:50 PM

## 2023-03-07 NOTE — Progress Notes (Addendum)
PROGRESS NOTE    Heather Haley  NFA:[id number] DOB: 03/10/1960 DOA: 03/06/2023 PCP: Patient, No Pcp Per  Chief Complaint  Patient presents with   Fall    Brief Narrative:   63 yo with AML, drug induced parkinsonism, mood disorder/PTSD/ schizoaffective disorder and multiple other medical issues here after Heather Haley fall, now found to have febrile neutropenia.  Per discussion with her family, she's had confusion since she started he ventoclax, worse in the past week or so.  Assessment & Plan:   Principal Problem:   Aspiration pneumonia (HCC) Active Problems:   Hypertension   Chronic constipation   Diabetic sensory peripheral neuropathy (Bilateral Lower Extremity)   Generalized anxiety disorder   History of panic attacks   GERD (gastroesophageal reflux disease)   Non-insulin dependent type 2 diabetes mellitus (HCC)   Pure hypercholesterolemia   MDD (major depressive disorder), recurrent, severe, with psychosis (HCC)   Schizoaffective disorder, depressive type (HCC)   PTSD (post-traumatic stress disorder)   Drug-induced Parkinson's disease (HCC)   OSA (obstructive sleep apnea)   Pancytopenia (HCC)   Class 2 obesity   Physical deconditioning   Hematoma of occipital region of scalp   Acute myeloid leukemia not having achieved remission (HCC)  Heather Aeson Sawyers NP from [facility] requesting transfer given this is Heather Haley study patient.  Underway.  Febrile Neutropenia Sepsis due to presumed pneumonia On 4 L (requiring NRB after I saw her after breathing treatment) CXR with LLL consolidation Broaden abx with febrile neutropenia Zosyn (will include vanc coverage for now with her encephalopathy and declining respiratory status) CT chest and abdomen/pelvis once she's more stable from Heather Haley respiratory status Per discussion with oncology at [facility], no granix  Acute Metabolic Encephalopathy Myoclonic Jerks Hyperreflexia Suspect this is related to her underlying infection above Head CT with large posterior  scalp hematoma with no underlying skull fracture, small parafalcine subdural hematoma Venous blood gas without hypercarbia Ammonia, TSH, vitamin b12 pending Will get EEG Will get MRI brain once more stable from respiratory standpoint Consider additional neuro imaging  Treating with abx as noted above  Fall  Subdural Hematoma  Posterior Scalp Hematoma Per neurosurgery, no need for intervention or follow up  Once more stable and able to participate, will need therapy orders  Acute Myeloid Leukemia Pancytopenia  Currently on venetoclax Oncology from [facility] requesting transfer  Chronic Pain Continue butrans patch Will reduce oxy dose to 2.5   Drug Induced Parkinsonism Continue sinemet  T2DM Peripheral  Neuropathy  PTSD Schizoaffective Disorder Mood Disorder Zyprexa, amitriptyline  OSA CPAP  RLS Ropinirole  Obesity Body mass index is 39.91 kg/m.     DVT prophylaxis: SCD Code Status: full Family Communication: daughter  Disposition:   Status is: Inpatient Remains inpatient appropriate because: need for inpatient care   Consultants:  none  Procedures:  none  Antimicrobials:  Anti-infectives (From admission, onward)    Start     Dose/Rate Route Frequency Ordered Stop   03/07/23 1245  vancomycin (VANCOREADY) IVPB 2000 mg/400 mL        2,000 mg 200 mL/hr over 120 Minutes Intravenous  Once 03/07/23 1151     03/07/23 0800  piperacillin-tazobactam (ZOSYN) IVPB 3.375 g        3.375 g 12.5 mL/hr over 240 Minutes Intravenous Every 8 hours 03/07/23 0729     03/06/23 1600  azithromycin (ZITHROMAX) 500 mg in sodium chloride 0.9 % 250 mL IVPB  Status:  Discontinued        500 mg 250 mL/hr  over 60 Minutes Intravenous Every 24 hours 03/06/23 1428 03/07/23 0719   03/06/23 1500  cefTRIAXone (ROCEPHIN) 2 g in sodium chloride 0.9 % 100 mL IVPB  Status:  Discontinued        2 g 200 mL/hr over 30 Minutes Intravenous Every 24 hours 03/06/23 1428 03/07/23 0719        Subjective: Lethargic, limited ability to give information Daughter notes she was found down   Objective: Vitals:   03/07/23 0747 03/07/23 0804 03/07/23 1049 03/07/23 1130  BP: 115/61  (!) 135/59   Pulse: (!) 104  (!) 113   Resp:      Temp: 98.4 F (36.9 C)     TempSrc: Oral     SpO2: 92% (!) 88%  90%  Weight:      Height:        Intake/Output Summary (Last 24 hours) at 03/07/2023 1159 Last data filed at 03/07/2023 1000 Gross per 24 hour  Intake 1070 ml  Output 900 ml  Net 170 ml   Filed Weights   03/06/23 1118  Weight: 108.8 kg    Examination:  General exam: chronically ill appearing with acute encephalopathy Respiratory system: diminished Cardiovascular system: tachycardic regular Gastrointestinal system: Abdomen is nondistended, soft and nontender. Central nervous system: lethargic, resting tremor, diffuse myoclonic jerking.  Cogwheeling to upper extremities.  Hyperreflexic patellar reflexes.  Negative babinskis. Extremities: no LEE   Data Reviewed: I have personally reviewed following labs and imaging studies  CBC: Recent Labs  Lab 03/06/23 1140 03/07/23 0456  WBC 1.1* 0.6*  NEUTROABS 0.9* 0.4*  HGB 8.8* 8.3*  HCT 28.4* 27.7*  MCV 97.3 101.1*  PLT 62* 63*    Basic Metabolic Panel: Recent Labs  Lab 03/06/23 1140 03/07/23 0456  NA 129* 130*  K 3.6 3.9  CL 90* 91*  CO2 26 27  GLUCOSE 296* 146*  BUN 18 14  CREATININE 0.82 0.70  CALCIUM 8.1* 8.1*    GFR: Estimated Creatinine Clearance: 88.3 mL/min (by C-G formula based on SCr of 0.7 mg/dL).  Liver Function Tests: Recent Labs  Lab 03/07/23 0456  AST 22  ALT 9  ALKPHOS 82  BILITOT 1.6*  PROT 7.4  ALBUMIN 2.6*    CBG: Recent Labs  Lab 03/06/23 1718 03/06/23 2111 03/07/23 0743 03/07/23 1138  GLUCAP 252* 204* 139* 125*     No results found for this or any previous visit (from the past 240 hours).       Radiology Studies: CT Head Wo Contrast Addendum Date:  03/06/2023 ADDENDUM REPORT: 03/06/2023 13:12 ADDENDUM: Study discussed by telephone with PA KAYLA KEITH on 03/06/2023 at 1251 hours. Electronically Signed   By: Odessa Fleming M.D.   On: 03/06/2023 13:12   Result Date: 03/06/2023 CLINICAL DATA:  63 year old female status post fall, found down. Posterior head hematoma. EXAM: CT HEAD WITHOUT CONTRAST TECHNIQUE: Contiguous axial images were obtained from the base of the skull through the vertex without intravenous contrast. RADIATION DOSE REDUCTION: This exam was performed according to the departmental dose-optimization program which includes automated exposure control, adjustment of the mA and/or kV according to patient size and/or use of iterative reconstruction technique. COMPARISON:  Brain MRI 09/06/2020.  Head CT 06/09/2016. FINDINGS: Brain: Small volume posterior para falcine subdural blood on series 6, image 61 measures up to 3 mm. No other acute intracranial hemorrhage identified. No IVH or ventriculomegaly. No midline shift, mass effect, or evidence of intracranial mass lesion. No cortically based acute infarct identified. Stable gray-white matter  differentiation throughout the brain. Vascular: No suspicious intracranial vascular hyperdensity. Skull: No fracture identified. Sinuses/Orbits: Visualized paranasal sinuses and mastoids are stable and well aerated. Other: Broad-based right posterior and superior scalp hematoma, up to 2 cm in thickness. Underlying calvarium appears stable and intact. Visualized orbit soft tissues are within normal limits. Traumatic Brain Injury Risk Stratification Skull Fracture: No - Low/mBIG 1 Subdural Hematoma (SDH): <5mm - mBIG 1 Subarachnoid Hemorrhage [facility]): No Epidural Hematoma (EDH): No - Low/mBIG 1 Cerebral contusion, intra-axial, intraparenchymal Hemorrhage (IPH): No Intraventricular Hemorrhage (IVH): No - Low/mBIG 1 Midline Shift > 1mm or Edema/effacement of sulci/vents: No - Low/mBIG 1  ---------------------------------------------------- IMPRESSION: 1. Large posterior scalp hematoma with no underlying skull fracture, but Positive for Johathon Overturf small para-falcine Subdural Hematoma, up to 3 mm. 2. No intracranial mass effect. No other acute intracranial abnormality. Electronically Signed: By: Odessa Fleming M.D. On: 03/06/2023 12:47   DG Chest 2 View Result Date: 03/06/2023 CLINICAL DATA:  63 year old female status post fall, found down. Posterior head hematoma. EXAM: CHEST - 2 VIEW COMPARISON:  CT Cervical spine today, Chest radiographs 01/23/2023. FINDINGS: Semi upright AP and lateral views at 1223 hours. Confluent airspace opacity, evidence of consolidation in the left lower lobe on both views. Small pleural effusion difficult to exclude. Similar lung volumes to last month. Mediastinal contours are stable and within normal limits. Visualized tracheal air column is within normal limits. The right lung appears to remain clear. No acute osseous abnormality identified. Negative visible bowel gas. Abdomen Calcified aortic atherosclerosis. IMPRESSION: Left lower lobe consolidation, new from last month. Consider Aspiration and/or Pneumonia in this setting. Small left pleural effusion is possible. Electronically Signed   By: Odessa Fleming M.D.   On: 03/06/2023 12:54   CT Cervical Spine Wo Contrast Result Date: 03/06/2023 CLINICAL DATA:  63 year old female status post fall, found down. Posterior head hematoma. EXAM: CT CERVICAL SPINE WITHOUT CONTRAST TECHNIQUE: Multidetector CT imaging of the cervical spine was performed without intravenous contrast. Multiplanar CT image reconstructions were also generated. RADIATION DOSE REDUCTION: This exam was performed according to the departmental dose-optimization program which includes automated exposure control, adjustment of the mA and/or kV according to patient size and/or use of iterative reconstruction technique. COMPARISON:  Head CT today reported separately.  Neck CT  03/27/2020. FINDINGS: Alignment: Improved cervical lordosis compared to 2022. Cervicothoracic junction alignment is within normal limits. Bilateral posterior element alignment is within normal limits. Skull base and vertebrae: Mild motion artifact. Bone mineralization is within normal limits. Visualized skull base is intact. No atlanto-occipital dissociation. C1 and C2 appear intact and aligned. No acute osseous abnormality identified. Soft tissues and spinal canal: No prevertebral fluid or swelling. No visible canal hematoma. Negative visible noncontrast neck soft tissues. Disc levels: Chronic C5-C6 and C6-C7 disc and endplate degeneration does not appear significantly changed from 2022. Upper chest: Mild motion artifact, otherwise negative. IMPRESSION: 1. No acute traumatic injury identified in the cervical spine. 2. Chronic C5-C6 and C6-C7 disc and endplate degeneration. Electronically Signed   By: Odessa Fleming M.D.   On: 03/06/2023 12:50        Scheduled Meds:  allopurinol  300 mg Oral Daily   amitriptyline  75 mg Oral QHS   amLODipine  5 mg Oral Daily   buprenorphine  1 patch Transdermal Weekly   carbidopa-levodopa  1 tablet Oral TID   insulin aspart  0-15 Units Subcutaneous TID WC   insulin glargine-yfgn  38 Units Subcutaneous QHS   ipratropium-albuterol  3 mL Nebulization  QID   isosorbide mononitrate  30 mg Oral Daily   levothyroxine  50 mcg Oral Once per day on Monday Tuesday Wednesday Thursday Friday   And   [START ON 03/12/2023] levothyroxine  75 mcg Oral Once per day on Sunday Saturday   magnesium oxide  800 mg Oral Daily   metFORMIN  1,000 mg Oral BID WC   metoprolol tartrate  25 mg Oral BID   OLANZapine  2.5 mg Oral QHS   mouth rinse  15 mL Mouth Rinse 4 times per day   rOPINIRole  0.5 mg Oral TID   torsemide  20 mg Oral QODAY   Continuous Infusions:  piperacillin-tazobactam (ZOSYN)  IV 3.375 g (03/07/23 0846)   vancomycin       LOS: 1 day    Time spent: over 30  min    Lacretia Nicks, MD [facility]   To contact the attending provider between 7A-7P or the covering provider during after hours 7P-7A, please log into the web site [email or web address] and access using universal [place] password for that web site. If you do not have the password, please call the hospital operator.  03/07/2023, 11:59 AM

## 2023-03-07 NOTE — Progress Notes (Signed)
SLP Cancellation Note  Patient Details Name: Heather Haley MRN: [id number] DOB: 1960/12/25   Cancelled treatment:       Reason Eval/Treat Not Completed: Other (comment) SLP spoke with patient's attending, Dr. Lowell Guitar who advised to defer cognitive evaluation at this time. Thank you for this consult and please reorder if needed!  Angela Nevin, MA, CCC-SLP Speech Therapy

## 2023-03-07 NOTE — Progress Notes (Signed)
2.5 mg oxy wasted with Letitia Caul in 4E clean utility stericycle. Pharmacy called due to discrepancy that was created when this RN returned a previous oxy that had been pulled.  Discrepancy fixed and note placed in pyxis.

## 2023-03-07 NOTE — Progress Notes (Signed)
   03/06/23 2112  Vitals  Temp (!) 102.4 F (39.1 C)  Temp Source Oral  Level of Consciousness  Level of Consciousness Alert  MEWS COLOR  MEWS Score Color Red  MEWS Score  MEWS Temp 2  MEWS Systolic 0  MEWS Pulse 2  MEWS RR 0  MEWS LOC 0  MEWS Score 4  Provider Notification  Provider Name/Title Chinita Greenland, NVP  Date Provider Notified 03/06/23  Time Provider Notified 2122  Method of Notification Page (Secure chat)  Notification Reason Change in status  Provider response No new orders  Date of Provider Response 03/06/23  Time of Provider Response 2133

## 2023-03-07 NOTE — Progress Notes (Signed)
Pharmacy Antibiotic Note  Heather Haley is a 63 y.o. female admitted on 03/06/2023 with  aspiration PNA .  Pharmacy has been consulted for Zosyn dosing.  Plan: Zosyn 3.375g IV q8h (4 hour infusion).  Height: 5\' 5"  (165.1 cm) Weight: 108.8 kg (239 lb 13.8 oz) IBW/kg (Calculated) : 57  Temp (24hrs), Avg:98.9 F (37.2 C), Min:97.3 F (36.3 C), Max:102.4 F (39.1 C)  Recent Labs  Lab 03/06/23 1140 03/07/23 0456  WBC 1.1* 0.6*  CREATININE 0.82 0.70    Estimated Creatinine Clearance: 88.3 mL/min (by C-G formula based on SCr of 0.7 mg/dL).    Allergies  Allergen Reactions   Codeine Hives   Cymbalta [Duloxetine Hcl] Other (See Comments)    Altered mental status, Alopecia, visual hallucinations, nightmares   Gabapentin Swelling   Nsaids Anaphylaxis    REACTION: palpitations, diaphoresis  Reaction: Diaphoresis / Sweating (intolerance); AND CHEST PAINS   Other Reaction(s): Diaphoresis / Sweating (intolerance)    Reaction: Diaphoresis / Sweating (intolerance); AND CHEST PAINS     AND CHEST PAINS    "feel like I'm having a heart attack"   Prolixin Decanoate [Fluphenazine]     Ineffective    Benztropine Mesylate Other (See Comments)   Buprenorphine Hcl Other (See Comments)    Unable to void   Morphine And Codeine Other (See Comments)    Unable to void   Other Other (See Comments)    "Anesthesia makes me sick"   Risperidone And Related Other (See Comments)    tremors   Wellbutrin [Bupropion]     Constipation, mood swings   Benztropine Other (See Comments)    Hair fall out  Suicide thoughts   Carbidopa-Levodopa Nausea Only   Lyrica [Pregabalin] Other (See Comments)    Alopecia, visual hallucinations, nightmares Altered mental status   Nortripytline Hcl [Nortriptyline] Other (See Comments)    Hair loss and night mares    Penicillins Nausea And Vomiting and Other (See Comments)    REACTION: upset stomach Has patient had a PCN reaction causing immediate rash,  facial/tongue/throat swelling, SOB or lightheadedness with hypotension: No Has patient had a PCN reaction causing severe rash involving mucus membranes or skin necrosis: No Has patient had a PCN reaction that required hospitalization: No Has patient had a PCN reaction occurring within the last 10 years: No If all of the above answers are "NO", then may proceed with Cephalosporin use.   Tolmetin Other (See Comments) and Palpitations    REACTION: palpitations, diaphoresis     Dosage will likely remain stable at above dosage and need for further dosage adjustment appears unlikely at present.    Will sign off at this time.  Please reconsult if a change in clinical status warrants re-evaluation of dosage.   Adalberto Cole, PharmD, BCPS 03/07/2023 7:30 AM

## 2023-03-07 NOTE — Plan of Care (Signed)
  Problem: Coping: Goal: Ability to adjust to condition or change in health will improve Outcome: Progressing   Problem: Nutritional: Goal: Maintenance of adequate nutrition will improve Outcome: Progressing   Problem: Tissue Perfusion: Goal: Adequacy of tissue perfusion will improve Outcome: Progressing   Problem: Clinical Measurements: Goal: Diagnostic test results will improve Outcome: Progressing

## 2023-03-07 NOTE — Progress Notes (Signed)
PT Cancellation Note  Patient Details Name: Heather Haley MRN: [id number] DOB: [birth year]   Cancelled Treatment:    Reason Eval/Treat Not Completed: Medical issues which prohibited therapy at this time. PT made aware that MD to cancel therapy orders at this time.   Johnny Bridge, PT Acute Rehab   Jacqualyn Posey 03/07/2023, 1:39 PM

## 2023-03-08 ENCOUNTER — Inpatient Hospital Stay (HOSPITAL_COMMUNITY): Payer: Medicare HMO

## 2023-03-08 DIAGNOSIS — D709 Neutropenia, unspecified: Secondary | ICD-10-CM | POA: Diagnosis not present

## 2023-03-08 DIAGNOSIS — C9200 Acute myeloblastic leukemia, not having achieved remission: Secondary | ICD-10-CM | POA: Diagnosis not present

## 2023-03-08 DIAGNOSIS — J189 Pneumonia, unspecified organism: Secondary | ICD-10-CM | POA: Diagnosis not present

## 2023-03-08 DIAGNOSIS — R5081 Fever presenting with conditions classified elsewhere: Secondary | ICD-10-CM

## 2023-03-08 LAB — GLUCOSE, CAPILLARY
Glucose-Capillary: 84 mg/dL (ref 70–99)
Glucose-Capillary: 101 mg/dL — ABNORMAL HIGH (ref 70–99)
Glucose-Capillary: 173 mg/dL — ABNORMAL HIGH (ref 70–99)
Glucose-Capillary: 157 mg/dL — ABNORMAL HIGH (ref 70–99)

## 2023-03-08 LAB — CBC WITH DIFFERENTIAL/PLATELET
Abs Immature Granulocytes: 0.04 10*3/uL (ref 0.00–0.07)
Basophils Absolute: 0 10*3/uL (ref 0.0–0.1)
Basophils Relative: 0 %
Eosinophils Absolute: 0 10*3/uL (ref 0.0–0.5)
Eosinophils Relative: 0 %
HCT: 22.4 % — ABNORMAL LOW (ref 36.0–46.0)
Hemoglobin: 6.7 g/dL — CL (ref 12.0–15.0)
Immature Granulocytes: 5 %
Lymphocytes Relative: 41 %
Lymphs Abs: 0.3 10*3/uL — ABNORMAL LOW (ref 0.7–4.0)
MCH: 29.6 pg (ref 26.0–34.0)
MCHC: 29.9 g/dL — ABNORMAL LOW (ref 30.0–36.0)
MCV: 99.1 fL (ref 80.0–100.0)
Monocytes Absolute: 0.1 10*3/uL (ref 0.1–1.0)
Monocytes Relative: 9 %
Neutro Abs: 0.3 10*3/uL — CL (ref 1.7–7.7)
Neutrophils Relative %: 45 %
Platelets: 65 10*3/uL — ABNORMAL LOW (ref 150–400)
RBC: 2.26 MIL/uL — ABNORMAL LOW (ref 3.87–5.11)
RDW: 19 % — ABNORMAL HIGH (ref 11.5–15.5)
Smear Review: DECREASED
WBC: 0.8 10*3/uL — CL (ref 4.0–10.5)
nRBC: 5.3 % — ABNORMAL HIGH (ref 0.0–0.2)

## 2023-03-08 LAB — COMPREHENSIVE METABOLIC PANEL
ALT: 6 U/L (ref 0–44)
AST: 19 U/L (ref 15–41)
Albumin: 2.2 g/dL — ABNORMAL LOW (ref 3.5–5.0)
Alkaline Phosphatase: 74 U/L (ref 38–126)
Anion gap: 11 (ref 5–15)
BUN: 20 mg/dL (ref 8–23)
CO2: 28 mmol/L (ref 22–32)
Calcium: 7.4 mg/dL — ABNORMAL LOW (ref 8.9–10.3)
Chloride: 92 mmol/L — ABNORMAL LOW (ref 98–111)
Creatinine, Ser: 0.96 mg/dL (ref 0.44–1.00)
GFR, Estimated: >60 mL/min (ref >60)
Glucose, Bld: 118 mg/dL — ABNORMAL HIGH (ref 70–99)
Potassium: 3.1 mmol/L — ABNORMAL LOW (ref 3.5–5.1)
Sodium: 131 mmol/L — ABNORMAL LOW (ref 135–145)
Total Bilirubin: 1.2 mg/dL (ref 0.0–1.2)
Total Protein: 6.4 g/dL — ABNORMAL LOW (ref 6.5–8.1)

## 2023-03-08 LAB — URINE CULTURE: Culture: NO GROWTH

## 2023-03-08 LAB — MAGNESIUM: Magnesium: 2.4 mg/dL (ref 1.7–2.4)

## 2023-03-08 LAB — PREPARE RBC (CROSSMATCH)

## 2023-03-08 LAB — PHOSPHORUS: Phosphorus: 3.8 mg/dL (ref 2.5–4.6)

## 2023-03-08 MED ORDER — CHLORHEXIDINE GLUCONATE CLOTH 2 % EX PADS
6.0000 | MEDICATED_PAD | Freq: Every day | CUTANEOUS | Status: DC
  Administered 2023-03-08 – 2023-03-10 (×3): 6 via TOPICAL

## 2023-03-08 MED ORDER — SODIUM CHLORIDE 0.9% IV SOLUTION: Freq: Once | INTRAVENOUS | Status: AC

## 2023-03-08 MED ORDER — SENNA 8.6 MG PO TABS
2.0000 | ORAL_TABLET | Freq: Once | ORAL | Status: AC
  Administered 2023-03-08: 17.2 mg via ORAL
  Filled 2023-03-08: qty 2

## 2023-03-08 MED ORDER — IOHEXOL 300 MG/ML  SOLN
100.0000 mL | Freq: Once | INTRAMUSCULAR | Status: AC | PRN
  Administered 2023-03-08: 100 mL via INTRAVENOUS

## 2023-03-08 MED ORDER — POTASSIUM CHLORIDE CRYS ER 20 MEQ PO TBCR
40.0000 meq | EXTENDED_RELEASE_TABLET | ORAL | Status: AC
  Administered 2023-03-08 (×2): 40 meq via ORAL
  Filled 2023-03-08 (×2): qty 2

## 2023-03-08 NOTE — Progress Notes (Signed)
Arrived at patients room to do EEG.  Staff in room with patient.  Patient getting ready to go for CT and MRI.  Unavailable for EEG at this time.

## 2023-03-08 NOTE — Evaluation (Signed)
Clinical/Bedside Swallow Evaluation Patient Details  Name: Heather Haley MRN: [id number] Date of Birth: 18-Feb-1960  Today's Date: 03/08/2023 Time: SLP Start Time (ACUTE ONLY): 1400 SLP Stop Time (ACUTE ONLY): 1430 SLP Time Calculation (min) (ACUTE ONLY): 30 min  Past Medical History:  Past Medical History:  Diagnosis Date   Anxiety    Asthma    Chronic kidney disease    Chronic pain    previously saw Dr. Shireen Quan in pain clinic, then saw pain specialist in [facility]   Depression    Diabetes mellitus (HCC)    Frequency of urination    GERD (gastroesophageal reflux disease)    Headache(784.0)    High cholesterol    History of kidney stones    Hypertension    IBS (irritable bowel syndrome)    Left ankle instability    Left knee DJD    Lumbar Degenerative Disc Disease of  10/11/2014   Neuromuscular disorder (HCC)    Osteoarthritis of hip (Right) 05/05/2015   Other enthesopathy of ankle and tarsus 12/15/2009   Qualifier: Diagnosis of  By: Darrick Penna MD, KARL     Parkinson's disease (HCC)    Peripheral sensory neuropathy (Bilateral) 11/19/2014   Postoperative nausea and vomiting    Schizophrenia (HCC)    Past Surgical History:  Past Surgical History:  Procedure Laterality Date   ABDOMINAL HYSTERECTOMY     ANKLE SURGERY Left    x 2   APPENDECTOMY     CARDIAC CATHETERIZATION     COLONOSCOPY  2013   COLONOSCOPY WITH PROPOFOL N/A 04/25/2017   Procedure: COLONOSCOPY WITH PROPOFOL;  Surgeon: Scot Jun, MD;  Location: [facility] ENDOSCOPY;  Service: Endoscopy;  Laterality: N/A;   COLONOSCOPY WITH PROPOFOL N/A 05/25/2022   Procedure: COLONOSCOPY WITH PROPOFOL;  Surgeon: [name], MD;  Location: [facility] ENDOSCOPY;  Service: Endoscopy;  Laterality: N/A;   CYSTOSCOPY/URETEROSCOPY/HOLMIUM LASER/STENT PLACEMENT Left 09/11/2019   Procedure: CYSTOSCOPY/URETEROSCOPY/HOLMIUM LASER/STENT PLACEMENT;  Surgeon: Riki Altes, MD;  Location: [facility] ORS;  Service: Urology;  Laterality:  Left;   ESOPHAGOGASTRODUODENOSCOPY (EGD) WITH PROPOFOL N/A 10/03/2014   Procedure: ESOPHAGOGASTRODUODENOSCOPY (EGD) WITH PROPOFOL;  Surgeon: Elnita Maxwell, MD;  Location: [facility] ENDOSCOPY;  Service: Endoscopy;  Laterality: N/A;   ESOPHAGOGASTRODUODENOSCOPY (EGD) WITH PROPOFOL  04/25/2017   Procedure: ESOPHAGOGASTRODUODENOSCOPY (EGD) WITH PROPOFOL;  Surgeon: Scot Jun, MD;  Location: [facility] ENDOSCOPY;  Service: Endoscopy;;   ESOPHAGOGASTRODUODENOSCOPY (EGD) WITH PROPOFOL N/A 05/25/2022   Procedure: ESOPHAGOGASTRODUODENOSCOPY (EGD) WITH PROPOFOL;  Surgeon: [name], MD;  Location: [facility] ENDOSCOPY;  Service: Endoscopy;  Laterality: N/A;   IR BONE MARROW BIOPSY & ASPIRATION  01/03/2023   JOINT REPLACEMENT Left    knee   KNEE ARTHROSCOPY  1997   left knee   LEFT HEART CATH AND CORONARY ANGIOGRAPHY Left 11/10/2017   Procedure: LEFT HEART CATH AND CORONARY ANGIOGRAPHY;  Surgeon: Marcina Millard, MD;  Location: [facility] INVASIVE CV LAB;  Service: Cardiovascular;  Laterality: Left;   LEFT HEART CATHETERIZATION WITH CORONARY ANGIOGRAM N/A 01/11/2013   Procedure: LEFT HEART CATHETERIZATION WITH CORONARY ANGIOGRAM;  Surgeon: [name], MD;  Location: [facility] CATH LAB;  Service: Cardiovascular;  Laterality: N/A;   TOTAL KNEE ARTHROPLASTY  08/30/2011   Procedure: TOTAL KNEE ARTHROPLASTY;  Surgeon: Nilda Simmer, MD;  Location: [facility] OR;  Service: Orthopedics;  Laterality: Left;   HPI:  63yo female admitted 03/06/23 after a fall at home. PMH: leukemia, anxiety, depression, GERD, schizoaffective disorder, schiophrenia, PTSD, insomnia, tobacco abuse, drug-induced Parkinson's dz, asthma, carpal  tunner, CKD, chronic low back pain, pancytopenia, nephrolithiasis, HLT, HTN, IBS, neuromuscular d/o, DM with peripheral neuropathy, PONV. Steady decline in mental status in recent months per son. CXR - LLL consolidation. MRI = trace SDH posterior falx and left temporo-occipital region. MBS 03/2018 = no  pen/asp, reg/thin. CXR = LLL PNA, right lung clear.    Assessment / Plan / Recommendation  Clinical Impression  Pt presents with increased aspiration risk following fall with SDH. Her family reports increased difficulty swallowing prior to admit. Pt is edentulous (was supposed to pick up dentures today). Volitional cough is congested and nonproductive.   Pt accepted trials of ice chips, water, puree, and solid textures. Slow oral prep of solids, likely due to lack of dentition. Intermittent cough noted (x2), but not immediately following PO presentations. Pt does not report globus sensation, but does endorse reflux. Given multiple comorbidities and bedside presentation, recommend proceeding with instrumental study to objectively assess swallow physiology and function.   Transfer to [facility] hospital pending - will complete MBS tomorrow here at [facility] if pt not discharged to [facility]. RN/MD/SLP aware. Continue current diet at this time.  SLP Visit Diagnosis: Dysphagia, unspecified (R13.10)    Aspiration Risk  Moderate aspiration risk    Diet Recommendation Regular;Thin liquid    Liquid Administration via: Cup;Straw Medication Administration: Whole meds with liquid Supervision: Patient able to self feed Compensations: Minimize environmental distractions;Slow rate;Small sips/bites Postural Changes: Seated upright at 90 degrees;Remain upright for at least 30 minutes after po intake    Other  Recommendations Oral Care Recommendations: Oral care BID    Recommendations for follow up therapy are one component of a multi-disciplinary discharge planning process, led by the attending physician.  Recommendations may be updated based on patient status, additional functional criteria and insurance authorization.  Follow up Recommendations Other (comment) (TBD)      Assistance Recommended at Discharge  TBD  Functional Status Assessment Patient has had a recent decline in their functional status and/or  demonstrates limited ability to make significant improvements in function in a reasonable and predictable amount of time  Frequency and Duration  (pending MBS)          Prognosis Prognosis for improved oropharyngeal function: Fair Barriers to Reach Goals: Other (Comment) (multiple co-morbidities)      Swallow Study   General Date of Onset: 03/06/23 HPI: 62yo female admitted 03/06/23 after a fall at home. PMH: leukemia, anxiety, depression, GERD, schizoaffective disorder, schiophrenia, PTSD, insomnia, tobacco abuse, drug-induced Parkinson's dz, asthma, carpal tunner, CKD, chronic low back pain, pancytopenia, nephrolithiasis, HLT, HTN, IBS, neuromuscular d/o, DM with peripheral neuropathy, PONV. Steady decline in mental status in recent months per son. CXR - LLL consolidation. MRI = trace SDH posterior falx and left temporo-occipital region. MBS 03/2018 = no pen/asp, reg/thin. CXR = LLL PNA, right lung clear. Type of Study: Bedside Swallow Evaluation Previous Swallow Assessment: MBS 03/2018 = no pen/asp, reg/thin Diet Prior to this Study: Regular;Thin liquids (Level 0) Temperature Spikes Noted: No Respiratory Status: Nasal cannula History of Recent Intubation: No Behavior/Cognition: Alert;Cooperative Oral Cavity Assessment: Within Functional Limits Oral Care Completed by SLP: No Oral Cavity - Dentition: Edentulous Vision: Functional for self-feeding Self-Feeding Abilities: Able to feed self Patient Positioning: Upright in bed Baseline Vocal Quality: Normal Volitional Cough: Strong;Congested Volitional Swallow: Able to elicit    Oral/Motor/Sensory Function Overall Oral Motor/Sensory Function: Generalized oral weakness   Ice Chips Ice chips: Within functional limits Presentation: Spoon   Thin Liquid Thin Liquid: Impaired Presentation: Cup;Self  Fed Pharyngeal  Phase Impairments: Cough - Delayed    Nectar Thick Nectar Thick Liquid: Not tested   Honey Thick Honey Thick Liquid: Not tested    Puree Puree: Within functional limits Presentation: Spoon   Solid     Solid: Impaired Presentation: Self Fed Oral Phase Impairments: Impaired mastication (due to lack of teeteh) Oral Phase Functional Implications: Impaired mastication     Marlies Ligman B. [name], MSP, CCC-SLP Speech Language Pathologist  Leigh Aurora 03/08/2023,2:38 PM

## 2023-03-08 NOTE — Progress Notes (Signed)
PT Cancellation Note  Patient Details Name: Heather Haley MRN: [id number] DOB: Apr 11, 1960   Cancelled Treatment:    Reason Eval/Treat Not Completed: Medical issues which prohibited therapy Checked on pt in the evening. Spoke with RN who reports pt more confused in evening and still with dizziness.  Will f/u with pt in morning when less confused.  Spoke with family who agree. Anise Salvo, PT Acute Rehab [facility] [phone]   Rayetta Humphrey 03/08/2023, 5:26 PM

## 2023-03-08 NOTE — Consult Note (Addendum)
[facility] CONSULT NOTE  Patient Care Team: Patient, No Pcp Per as PCP - General (General Practice) Leotis Shames, MD (Internal Medicine) Rickard Patience, MD as Consulting Physician (Oncology)  CHIEF COMPLAINTS/PURPOSE OF CONSULTATION:  AML on venetoclax  REFERRING PHYSICIAN: Dr. Lowell Guitar  HISTORY OF PRESENTING ILLNESS:  Heather Haley 63 y.o. female who came to the ED on 03/06/2023 with complaints of a fall with loss of consciousness and waking up with a headache and a head hematoma.  Patient with history of AML on venetoclax, being treated at [facility].  Patient found to have febrile neutropenia.  Therefore oncology consult is now requested. Patient is seen and assessed today.  Patient's daughter is in the room.  She is awake alert and oriented x 3, daughter is principal historian.  Patient recounts falling at home and showed a large painful hematoma on the back of her head.  She is very pleasant and answers all questions appropriately. Oncologic history significant for AML as stated above.  She is currently on venetoclax and follows with [facility] oncology.  Patient's daughter notes that she did miss the last 2 doses of venetoclax since this admission. Medical history significant for drug-induced parkinsonism, hypertension, diabetes, hypercholesterolemia. Surgical history includes left knee replacement several years ago.  Also includes multiple left ankle surgeries. Family history includes patient's mother with colon cancer, died in her mid 36s. Social history significant for 20-year history of tobacco use which she started in her 48s and quit 20 years ago.  Denies alcohol use, denies illicit and recreational drug use.  I have reviewed her chart and materials related to her cancer extensively and collaborated history with the patient. Summary of oncologic history is as follows: Oncology History   No history exists.    ASSESSMENT & PLAN:  1.  AML, newly diagnosed - Initially diagnosed  01/28/2023 - Currently on venetoclax which she states was started on 02/21/2023.  The last 2 doses scheduled for 1/26 and 03/07/2023 have been missed due to this admission.  Patient's daughter states she brought in the venetoclax and that it is currently with our pharmacy, she is requesting to take it back home as she does not want her mother to receive anymore for this cycle. - Noted in care everywhere: Patient is currently on a clinical trial at [facility] AML S12 trial which includes venetoclax po daily x 14 days and Azacitidine injection daily x7 per 28 day cycle.  The plan is to reevaluate with bone marrow bx after cycle 1 and continue meds as outlined for 2-3 cycles.    (Patient's daughter stated that she is actually on 15 days of venetoclax, this is not seen in the documentation). - Follows with [facility] oncology where patient may be transferred, awaiting bed. - [facility]/Dr. Asianna Brundage following while hospitalized here.  2.  Pancytopenia Febrile neutropenia - Due to malignancy - CBC done today.  WBC low 0.8.  No Granix per discussion with [facility] oncology per documentation. - Hemoglobin low 6.7.  Recommend PRBC transfusion for hemoglobin <7.0.  Use leukoreduced irradiated cells for transfusion. -Platelets low 65K.  No platelet transfusion indicated at this time.  Recommend platelet transfusion for counts <10K or <50 K with active bleeding. - Continue antibiotics as ordered -Monitor CBC with differential daily  3.  Status post fall/posterior scalp hematoma -MRI brain done 03/08/2023 shows trace subdural hematoma with right parietal scalp hematoma. -Seen by neurosurgery, no need for intervention. - Continue to monitor closely  4.  Parkinsonism  Schizophrenia - On Sinemet, continue as ordered -Monitor closely  5.  Hypertension/diabetes/hypercholesterolemia - Monitor blood pressure - Monitor blood sugar levels - Medicine following   Orders Placed This Encounter  Procedures   Expectorated  Sputum Assessment w Gram Stain, Rflx to Resp Cult    Standing Status:   Standing    Number of Occurrences:   1   Culture, blood (Routine X 2) w Reflex to ID Panel    Standing Status:   Standing    Number of Occurrences:   2   Urine Culture (for pregnant, neutropenic or urologic patients or patients with an indwelling urinary catheter)    Standing Status:   Standing    Number of Occurrences:   1    Indication:   Sepsis   Respiratory (~20 pathogens) panel by PCR    Standing Status:   Standing    Number of Occurrences:   1   MRSA Next Gen by PCR, Nasal    Standing Status:   Standing    Number of Occurrences:   1   SARS Coronavirus 2 by RT PCR (hospital order, performed in [facility] hospital lab) *cepheid single result test*    Standing Status:   Standing    Number of Occurrences:   1   CT Head Wo Contrast    Standing Status:   Standing    Number of Occurrences:   1   CT Cervical Spine Wo Contrast    Standing Status:   Standing    Number of Occurrences:   1   DG Chest 2 View    Standing Status:   Standing    Number of Occurrences:   1    Reason for Exam (SYMPTOM  OR DIAGNOSIS REQUIRED):   new o2 requirement   CT CHEST ABDOMEN PELVIS W CONTRAST    Standing Status:   Standing    Number of Occurrences:   1    Does the patient have a contrast media/X-ray dye allergy?:   No    If indicated for the ordered procedure, I authorize the administration of oral contrast media per Radiology protocol:   Yes   MR BRAIN WO CONTRAST    Standing Status:   Standing    Number of Occurrences:   1    What is the patient's sedation requirement?:   No Sedation    Does the patient have a pacemaker or implanted devices?:   No   DG CHEST PORT 1 VIEW    Standing Status:   Standing    Number of Occurrences:   1    Symptom/Reason for Exam:   Shortness of breath [786.05.ICD-9-CM]   Basic metabolic panel    Standing Status:   Standing    Number of Occurrences:   1   CBC with Differential    Standing  Status:   Standing    Number of Occurrences:   1   Urinalysis, Routine w reflex microscopic -Urine, Clean Catch    Standing Status:   Standing    Number of Occurrences:   1    Specimen Source:   Urine, Clean Catch [76]   CK    Standing Status:   Standing    Number of Occurrences:   1   CBC with Differential/Platelet    Standing Status:   Standing    Number of Occurrences:   1   Strep pneumoniae urinary antigen    Standing Status:   Standing    Number of  Occurrences:   1   CBC    Standing Status:   Standing    Number of Occurrences:   1   Comprehensive metabolic panel    Standing Status:   Standing    Number of Occurrences:   1   Hemoglobin A1c    To assess prior glycemic control    Standing Status:   Standing    Number of Occurrences:   1   Glucose, capillary    Standing Status:   Standing    Number of Occurrences:   1   Differential    Standing Status:   Standing    Number of Occurrences:   1   Glucose, capillary    Standing Status:   Standing    Number of Occurrences:   1   Blood gas, venous    Standing Status:   Standing    Number of Occurrences:   1   Ammonia    Standing Status:   Standing    Number of Occurrences:   1   Vitamin B12    Standing Status:   Standing    Number of Occurrences:   1   TSH    Standing Status:   Standing    Number of Occurrences:   1   Folate    Standing Status:   Standing    Number of Occurrences:   1   Glucose, capillary    Standing Status:   Standing    Number of Occurrences:   1   Creatinine, serum    Standing Status:   Standing    Number of Occurrences:   9   Glucose, capillary    Standing Status:   Standing    Number of Occurrences:   1   CBC with Differential/Platelet    Standing Status:   Standing    Number of Occurrences:   1   Comprehensive metabolic panel    Standing Status:   Standing    Number of Occurrences:   1   Magnesium    Standing Status:   Standing    Number of Occurrences:   1   Phosphorus     Standing Status:   Standing    Number of Occurrences:   1   Glucose, capillary    Standing Status:   Standing    Number of Occurrences:   1   CBC with Differential/Platelet    Standing Status:   Standing    Number of Occurrences:   1   Glucose, capillary    Standing Status:   Standing    Number of Occurrences:   1   Glucose, capillary    Standing Status:   Standing    Number of Occurrences:   1   Diet heart healthy/carb modified Room service appropriate? Yes; Fluid consistency: Thin    Standing Status:   Standing    Number of Occurrences:   1    Diet-HS Snack?:   Nothing    Room service appropriate?:   Yes    Fluid consistency::   Thin   Cardiac Monitoring Continuous x 24 hours Indications for use: Other; Other indications for use: Aspiration pneumonia and head trauma.    Standing Status:   Standing    Number of Occurrences:   1    Indications for use::   Other    Other indications for use::   Aspiration pneumonia and head trauma.   Neuro checks    Standing Status:  Standing    Number of Occurrences:   1   Vital signs    Standing Status:   Standing    Number of Occurrences:   1   Notify physician (specify)    Standing Status:   Standing    Number of Occurrences:   20    Notify Physician:   for pulse less than 55 or greater than 120    Notify Physician:   for respiratory rate less than 12 or greater than 25    Notify Physician:   for temperature greater than 100.5 F    Notify Physician:   for urinary output less than 30 mL/hr for four hours    Notify Physician:   for systolic BP less than 90 or greater than 160, diastolic BP less than 60 or greater than 100    Notify Physician:   for new hypoxia w/ oxygen saturations < 88%   Mobility Protocol: No Restrictions RN to initiate protocols based on patient's level of care    RN to initiate protocols based on patient's level of care    Standing Status:   Standing    Number of Occurrences:   1   Refer to Sidebar Report Refer to  ICU, Med-Surg, Progressive, and Step-Down Mobility Protocol Sidebars    Refer to ICU, Med-Surg, Progressive, and Step-Down Mobility Protocol Sidebars    Standing Status:   Standing    Number of Occurrences:   1   Initiate Adult Central Line Maintenance and Catheter Protocol for patients with central line (CVC, PICC, Port, Hemodialysis, Trialysis)    Standing Status:   Standing    Number of Occurrences:   1   Do not place and if present remove PureWick    Standing Status:   Standing    Number of Occurrences:   1   Initiate Oral Care Protocol    Standing Status:   Standing    Number of Occurrences:   1   Initiate Carrier Fluid Protocol    Standing Status:   Standing    Number of Occurrences:   1   RN may order General Admission PRN Orders utilizing "General Admission PRN medications" (through manage orders) for the following patient needs: allergy symptoms (Claritin), cold sores (Carmex), cough (Robitussin DM), eye irritation (Liquifilm Tears), hemorrhoids (Tucks), indigestion (Maalox), minor skin irritation (Hydrocortisone Cream), muscle pain (Ben Gay), nose irritation (saline nasal spray) and sore throat (Chloraseptic spray).    Standing Status:   Standing    Number of Occurrences:   [phone]   Apply Pneumonia Care Plan    Standing Status:   Standing    Number of Occurrences:   1   SCDs    Standing Status:   Standing    Number of Occurrences:   1    Laterality:   Bilateral   Patient has an active order for admit to inpatient/place in observation    Standing Status:   Standing    Number of Occurrences:   1   Apply Diabetes Mellitus Care Plan    Standing Status:   Standing    Number of Occurrences:   1   STAT CBG when hypoglycemia is suspected. If treated, recheck every 15 minutes after each treatment until CBG >/= 70 mg/dl    Standing Status:   Standing    Number of Occurrences:   1   Refer to Hypoglycemia Protocol Sidebar Report for treatment of CBG < 70 mg/dl    Standing Status:  Standing    Number of Occurrences:   1   No HS correction Insulin    Standing Status:   Standing    Number of Occurrences:   1   Complete oral care assessment tool on admission, transfer, and q shift    Standing Status:   Standing    Number of Occurrences:   1   Refer to Sidebar Report Adult Oral Care Protocol    Adult Oral Care Protocol    Standing Status:   Standing    Number of Occurrences:   1   Complete oral care assessment tool on admission, transfer, and q shift    Standing Status:   Standing    Number of Occurrences:   1   Refer to Sidebar Report Adult Oral Care Protocol    Adult Oral Care Protocol    Standing Status:   Standing    Number of Occurrences:   1   Brush teeth with suction toothbrush 4 times daily    Standing Status:   Standing    Number of Occurrences:   1   In and Out Cath    Standing Status:   Standing    Number of Occurrences:   5   Cardiac Monitoring Continuous x 24 hours Indications for use: Other; Other indications for use: Aspiration pneumonia and head trauma.    Standing Status:   Standing    Number of Occurrences:   1    Indications for use::   Other    Other indications for use::   Aspiration pneumonia and head trauma.   Insert urethral catheter    Standing Status:   Standing    Number of Occurrences:   1    Catheter type:   foley    Reason for Insertion:   Bladder outlet obstruction / other urologic reason    Upon removal of foley, follow the Post Indwelling Urinary Catheter Retention Guidelines if patient meets criteria:   Yes   Continue foley catheter    Standing Status:   Standing    Number of Occurrences:   1    Reason to continue foley:   acute urinary retention   Refer to Sidebar Report - CHG cloths Sidebar    - CHG cloths Sidebar    Standing Status:   Standing    Number of Occurrences:   1   Patient Education: - [facility] Daily CHG Bathing    Standing Status:   Standing    Number of Occurrences:   1    Specify:   - [facility] Daily CHG  Bathing   Informed Consent Details: Physician/Practitioner Attestation; Transcribe to consent form and obtain patient signature    Standing Status:   Standing    Number of Occurrences:   1    Physician/Practitioner attestation of informed consent for blood and or blood product transfusion:   I, the physician/practitioner, attest that I have discussed with the patient the benefits, risks, side effects, alternatives, likelihood of achieving goals and potential problems during recovery for the procedure that I have provided informed consent.    Product(s):   All Product(s)   Full code    Standing Status:   Standing    Number of Occurrences:   1    By::   Consent: discussion documented in EHR   Consult to neurosurgery    Standing Status:   Standing    Number of Occurrences:   1    Place call to::   oncall  Neurosurgery    Reason for Consult:   Consult   Consult to hospitalist    Standing Status:   Standing    Number of Occurrences:   1    Place call to::   [facility]    Reason for Consult:   Admit   Consult to respiratory care treatment (RT)    Standing Status:   Standing    Number of Occurrences:   1    Reason for Consult?:   Evaluation   Inpatient consult to spiritual care    Standing Status:   Standing    Number of Occurrences:   1    Reason for Consult:   Create or update advance directive   monitoring by pharmacy    Standing Status:   Standing    Number of Occurrences:   1    Reason for Pharmacy Monitoring (*indicate specifics in comment field):   High-Risk PTA Med (RPh to use comment field to indicate meds)    Comment::   Venetoclax   vancomycin per pharmacy consult    Standing Status:   Standing    Number of Occurrences:   1    Indication::   Pneumonia   OT eval and treat    Standing Status:   Standing    Number of Occurrences:   1   Oxygen therapy Mode or (Route): Nasal cannula; Liters Per Minute: 2; Keep O2 saturation between: greater than 92 %    Standing Status:    Standing    Number of Occurrences:   1    Mode or (Route):   Nasal cannula    Liters Per Minute:   2    Keep O2 saturation between:   greater than 92 %   Bipap    Standing Status:   Standing    Number of Occurrences:   20   SLP eval and treat Reason for evaluation: Cognitive/Language evaluation    Standing Status:   Standing    Number of Occurrences:   1    Reason for evaluation:   Cognitive/Language evaluation   SLP eval and treat Reason for evaluation: .Swallowing evaluation (BSE, MBS and/or diet order as indicated)    Standing Status:   Standing    Number of Occurrences:   1    Reason for evaluation:   .Swallowing evaluation (BSE, MBS and/or diet order as indicated)   CBG monitoring, ED    Standing Status:   Standing    Number of Occurrences:   1   ED EKG    Standing Status:   Standing    Number of Occurrences:   1    Reason for Exam:   Syncope   EKG    Standing Status:   Standing    Number of Occurrences:   1   Type and screen [facility]    [facility]     Standing Status:   Standing    Number of Occurrences:   1   Prepare RBC (crossmatch)    Standing Status:   Standing    Number of Occurrences:   1    # of Units:   1 unit    Transfusion Indications:   Hemoglobin < 7 gm/dL and symptomatic    Special Requirements:   Irradiated-IRR    Number of Units to Keep Ahead:   NO units ahead    If emergent release call blood bank:   Not emergent release   EEG  adult    Standing Status:   Standing    Number of Occurrences:   1    Reason for exam:   Altered mental status   Place in observation (patient's expected length of stay will be less than 2 midnights)    Standing Status:   Standing    Number of Occurrences:   1    Hospital Area:   [facility] [[id number]]    Level of Care:   Telemetry [5]    Admit to tele based on following criteria:   Monitor for Ischemic changes    May place patient in observation at [facility] or  [facility] if equivalent level of care is available::   No    Covid Evaluation:   Asymptomatic - no recent exposure (last 10 days) testing not required    Diagnosis:   Aspiration pneumonia [facility]) [[id number]]    Admitting Physician:   Bobette Mo [[id number]]    Attending Physician:   Bobette Mo [phone]   Admit to Inpatient (patient's expected length of stay will be greater than 2 midnights or inpatient only procedure)    Standing Status:   Standing    Number of Occurrences:   1    Hospital Area:   [facility] [[id number]]    Level of Care:   Telemetry [5]    Admit to tele based on following criteria:   Monitor QTC interval    May admit patient to [facility] or [facility] if equivalent level of care is available::   No    Covid Evaluation:   Asymptomatic - no recent exposure (last 10 days) testing not required    Diagnosis:   Aspiration pneumonia [facility]) [[id number]]    Admitting Physician:   Bobette Mo [[id number]]    Attending Physician:   Bobette Mo [[id number]]    Certification::   I certify this patient will need inpatient services for at least 2 midnights    Expected Medical Readiness:   03/08/2023     MEDICAL HISTORY:  Past Medical History:  Diagnosis Date   Anxiety    Asthma    Chronic kidney disease    Chronic pain    previously saw Dr. Shireen Quan in pain clinic, then saw pain specialist in [facility]   Depression    Diabetes mellitus (HCC)    Frequency of urination    GERD (gastroesophageal reflux disease)    Headache(784.0)    High cholesterol    History of kidney stones    Hypertension    IBS (irritable bowel syndrome)    Left ankle instability    Left knee DJD    Lumbar Degenerative Disc Disease of  10/11/2014   Neuromuscular disorder (HCC)    Osteoarthritis of hip (Right) 05/05/2015   Other enthesopathy of ankle and tarsus 12/15/2009   Qualifier: Diagnosis of  By: Darrick Penna MD, KARL     Parkinson's disease (HCC)    Peripheral sensory  neuropathy (Bilateral) 11/19/2014   Postoperative nausea and vomiting    Schizophrenia (HCC)     SURGICAL HISTORY: Past Surgical History:  Procedure Laterality Date   ABDOMINAL HYSTERECTOMY     ANKLE SURGERY Left    x 2   APPENDECTOMY     CARDIAC CATHETERIZATION     COLONOSCOPY  2013   COLONOSCOPY WITH PROPOFOL N/A 04/25/2017   Procedure: COLONOSCOPY WITH PROPOFOL;  Surgeon: Scot Jun, MD;  Location: [facility] ENDOSCOPY;  Service: Endoscopy;  Laterality: N/A;  COLONOSCOPY WITH PROPOFOL N/A 05/25/2022   Procedure: COLONOSCOPY WITH PROPOFOL;  Surgeon: [name], MD;  Location: [facility] ENDOSCOPY;  Service: Endoscopy;  Laterality: N/A;   CYSTOSCOPY/URETEROSCOPY/HOLMIUM LASER/STENT PLACEMENT Left 09/11/2019   Procedure: CYSTOSCOPY/URETEROSCOPY/HOLMIUM LASER/STENT PLACEMENT;  Surgeon: Riki Altes, MD;  Location: [facility] ORS;  Service: Urology;  Laterality: Left;   ESOPHAGOGASTRODUODENOSCOPY (EGD) WITH PROPOFOL N/A 10/03/2014   Procedure: ESOPHAGOGASTRODUODENOSCOPY (EGD) WITH PROPOFOL;  Surgeon: Elnita Maxwell, MD;  Location[facility] ENDOSCOPY;  Service: Endoscopy;  Laterality: N/A;   ESOPHAGOGASTRODUODENOSCOPY (EGD) WITH PROPOFOL  04/25/2017   Procedure: ESOPHAGOGASTRODUODENOSCOPY (EGD) WITH PROPOFOL;  Surgeon: Scot Jun, MD;  Location: [facility] ENDOSCOPY;  Service: Endoscopy;;   ESOPHAGOGASTRODUODENOSCOPY (EGD) WITH PROPOFOL N/A 05/25/2022   Procedure: ESOPHAGOGASTRODUODENOSCOPY (EGD) WITH PROPOFOL;  Surgeon: [name], MD;  Location: [facility] ENDOSCOPY;  Service: Endoscopy;  Laterality: N/A;   IR BONE MARROW BIOPSY & ASPIRATION  01/03/2023   JOINT REPLACEMENT Left    knee   KNEE ARTHROSCOPY  1997   left knee   LEFT HEART CATH AND CORONARY ANGIOGRAPHY Left 11/10/2017   Procedure: LEFT HEART CATH AND CORONARY ANGIOGRAPHY;  Surgeon: Marcina Millard, MD;  Location: [facility] INVASIVE CV LAB;  Service: Cardiovascular;  Laterality: Left;   LEFT HEART CATHETERIZATION  WITH CORONARY ANGIOGRAM N/A 01/11/2013   Procedure: LEFT HEART CATHETERIZATION WITH CORONARY ANGIOGRAM;  Surgeon: [name], MD;  Location: [facility] CATH LAB;  Service: Cardiovascular;  Laterality: N/A;   TOTAL KNEE ARTHROPLASTY  08/30/2011   Procedure: TOTAL KNEE ARTHROPLASTY;  Surgeon: Nilda Simmer, MD;  Location: [facility] OR;  Service: Orthopedics;  Laterality: Left;    SOCIAL HISTORY: Social History   Socioeconomic History   Marital status: Divorced    Spouse name: Not on file   Number of children: 2   Years of education: 11   Highest education level: 11th grade  Occupational History    Employer: [employer]  Tobacco Use   Smoking status: Former    Current packs/day: 0.00    Average packs/day: 2.0 packs/day for 15.0 years (30.0 ttl pk-yrs)    Types: Cigarettes    Start date: 02/09/1980    Quit date: 02/09/1995    Years since quitting: 28.0   Smokeless tobacco: Never  Vaping Use   Vaping status: Never Used  Substance and Sexual Activity   Alcohol use: No    Alcohol/week: 0.0 standard drinks of alcohol   Drug use: No   Sexual activity: Not Currently  Other Topics Concern   Not on file  Social History Narrative   Patient lives at home alone. Patient works at [employer].   Caffeine daily- 2   Right handed.   Education- 11 th grade   Social Drivers of Corporate investment banker Strain: Medium Risk (01/27/2023)   Received from [facility]   Overall Financial Resource Strain (CARDIA)    Difficulty of Paying Living Expenses: Somewhat hard  Food Insecurity: No Food Insecurity (03/06/2023)   Hunger Vital Sign    Worried About Running Out of Food in the Last Year: Never true    Ran Out of Food in the Last Year: Never true  Transportation Needs: No Transportation Needs (03/06/2023)   PRAPARE - Administrator, Civil Service (Medical): No    Lack of Transportation (Non-Medical): No  Physical Activity: Inactive (01/27/2023)   Received from [facility]    Exercise Vital Sign    Days of Exercise per Week: 0 days  Minutes of Exercise per Session: 10 min  Stress: Stress Concern Present (01/27/2023)   Received from [facility] - Occupational Stress Questionnaire    Feeling of Stress : Rather much  Social Connections: Socially Isolated (03/06/2023)   Social Connection and Isolation Panel [NHANES]    Frequency of Communication with Friends and Family: More than three times a week    Frequency of Social Gatherings with Friends and Family: More than three times a week    Attends Religious Services: Never    Database administrator or Organizations: No    Attends [employer] Meetings: Never    Marital Status: Divorced  Catering manager Violence: Not At Risk (03/06/2023)   Humiliation, Afraid, Rape, and Kick questionnaire    Fear of Current or Ex-Partner: No    Emotionally Abused: No    Physically Abused: No    Sexually Abused: No    FAMILY HISTORY: Family History  Problem Relation Age of Onset   Cancer Mother    Colon cancer Mother    Hypertension Father    Heart disease Father    Alcohol abuse Father    Breast cancer Sister     REVIEW OF SYSTEMS:   Constitutional: +painful posterior hematoma, Denies fevers, chills or abnormal night sweats Eyes: Denies blurriness of vision, double vision or watery eyes Ears, nose, mouth, throat, and face: Denies mucositis or sore throat Respiratory: Denies cough, dyspnea or wheezes Cardiovascular: Denies palpitation, chest discomfort or lower extremity swelling Gastrointestinal: Denies nausea, heartburn or change in bowel habits Skin: +multiple ecchymotic areas Lymphatics: Denies new lymphadenopathy or easy bruising Neurological: Denies numbness, tingling or new weaknesses Behavioral/Psych: Mood is stable, no new changes  All other systems were reviewed with the patient and are negative.  PHYSICAL EXAMINATION: ECOG PERFORMANCE STATUS: 2 -  Symptomatic, <50% confined to bed  Vitals:   03/08/23 1100 03/08/23 1112  BP:  116/69  Pulse:  74  Resp:  19  Temp:  97.6 F (36.4 C)  SpO2: 95% 97%   Filed Weights   03/06/23 1118  Weight: 239 lb 13.8 oz (108.8 kg)    GENERAL: alert, no distress and comfortable +posterior hematoma SKIN: +pale skin color, texture, turgor are normal, no rashes or significant lesions EYES: normal, conjunctiva are pink and non-injected, sclera clear OROPHARYNX: no exudate, no erythema and lips, buccal mucosa, and tongue normal  NECK: supple, thyroid normal size, non-tender, without nodularity LYMPH: no palpable lymphadenopathy in the cervical, axillary or inguinal LUNGS: clear to auscultation and percussion with normal breathing effort HEART: regular rate & rhythm and no murmurs and no lower extremity edema ABDOMEN: abdomen soft, non-tender and normal bowel sounds MUSCULOSKELETAL: no cyanosis of digits and no clubbing  PSYCH: alert & oriented x 3 with fluent speech NEURO: no focal motor/sensory deficits   ALLERGIES:  is allergic to codeine, cymbalta [duloxetine hcl], gabapentin, nsaids, prolixin decanoate [fluphenazine], benztropine mesylate, buprenorphine hcl, morphine and codeine, other, risperidone and related, wellbutrin [bupropion], benztropine, carbidopa-levodopa, lyrica [pregabalin], nortripytline hcl [nortriptyline], penicillins, and tolmetin.  MEDICATIONS:  Current Facility-Administered Medications  Medication Dose Route Frequency Provider Last Rate Last Admin   0.9 %  sodium chloride infusion (Manually program via Guardrails IV Fluids)   Intravenous Once Zigmund Daniel., MD       acetaminophen (TYLENOL) tablet 1,000 mg  1,000 mg Oral Q8H Zigmund Daniel., MD   1,000 mg at 03/08/23 0535   Followed by   [  START ON 03/09/2023] acetaminophen (TYLENOL) tablet 650 mg  650 mg Oral Q6H PRN Zigmund Daniel., MD       allopurinol (ZYLOPRIM) tablet 300 mg  300 mg Oral Daily Bobette Mo, MD   300 mg at 03/08/23 1000   amitriptyline (ELAVIL) tablet 75 mg  75 mg Oral QHS Bobette Mo, MD   75 mg at 03/07/23 2212   amLODipine (NORVASC) tablet 5 mg  5 mg Oral Daily Bobette Mo, MD   5 mg at 03/08/23 1001   buprenorphine (BUTRANS) 10 MCG/HR 2 patch  2 patch Transdermal Weekly Zigmund Daniel., MD   2 patch at 03/07/23 1511   carbidopa-levodopa (SINEMET IR) 25-100 MG per tablet immediate release 1 tablet  1 tablet Oral TID Bobette Mo, MD   1 tablet at 03/08/23 1001   Chlorhexidine Gluconate Cloth 2 % PADS 6 each  6 each Topical Daily Zigmund Daniel., MD       hydrOXYzine (ATARAX) tablet 25 mg  25 mg Oral Q4H PRN Bobette Mo, MD   25 mg at 03/08/23 0454   insulin aspart (novoLOG) injection 0-15 Units  0-15 Units Subcutaneous TID WC Bobette Mo, MD   2 Units at 03/07/23 1753   insulin glargine-yfgn (SEMGLEE) injection 38 Units  38 Units Subcutaneous QHS Herby Abraham, RPH   38 Units at 03/07/23 2232   ipratropium-albuterol (DUONEB) 0.5-2.5 (3) MG/3ML nebulizer solution 3 mL  3 mL Nebulization QID Bobette Mo, MD   3 mL at 03/08/23 1100   isosorbide mononitrate (IMDUR) 24 hr tablet 30 mg  30 mg Oral Daily Bobette Mo, MD   30 mg at 03/08/23 1001   levothyroxine (SYNTHROID) tablet 50 mcg  50 mcg Oral Once per day on Monday Tuesday Wednesday Thursday Friday Herby Abraham, [place]   50 mcg at 03/08/23 0981   And   [START ON 03/12/2023] levothyroxine (SYNTHROID) tablet 75 mcg  75 mcg Oral Once per day on Sunday Saturday Herby Abraham, RPH       lidocaine (LIDODERM) 5 % 1-3 patch  1-3 patch Transdermal Q24H Zigmund Daniel., MD   1 patch at 03/07/23 1511   lip balm (CARMEX) ointment   Topical PRN Bobette Mo, MD   Given at 03/06/23 1838   magnesium oxide (MAG-OX) tablet 800 mg  800 mg Oral Daily Bobette Mo, MD   800 mg at 03/08/23 1001   metFORMIN (GLUCOPHAGE) tablet 1,000 mg  1,000 mg  Oral BID WC Bobette Mo, MD   1,000 mg at 03/08/23 1914   metoprolol tartrate (LOPRESSOR) tablet 25 mg  25 mg Oral BID Bobette Mo, MD   25 mg at 03/08/23 1001   OLANZapine (ZYPREXA) tablet 2.5 mg  2.5 mg Oral QHS Bobette Mo, MD   2.5 mg at 03/07/23 2212   ondansetron [facility]) tablet 4 mg  4 mg Oral Q6H PRN Bobette Mo, MD       Or   ondansetron [facility]) injection 4 mg  4 mg Intravenous Q6H PRN Bobette Mo, MD       Oral care mouth rinse  15 mL Mouth Rinse 4 times per day Bobette Mo, MD   15 mL at 03/08/23 7829   Oral care mouth rinse  15 mL Mouth Rinse PRN Bobette Mo, MD       oxyCODONE (Oxy IR/ROXICODONE) immediate release tablet 2.5  mg  2.5 mg Oral Q6H PRN Zigmund Daniel., MD   2.5 mg at 03/07/23 2214   piperacillin-tazobactam (ZOSYN) IVPB 3.375 g  3.375 g Intravenous Q8H Zigmund Daniel., MD 12.5 mL/hr at 03/08/23 0755 3.375 g at 03/08/23 0755   potassium chloride SA (KLOR-CON M) CR tablet 40 mEq  40 mEq Oral Q4H Zigmund Daniel., MD   40 mEq at 03/08/23 0747   rOPINIRole (REQUIP) tablet 0.5 mg  0.5 mg Oral TID Bobette Mo, MD   0.5 mg at 03/08/23 1001   SUMAtriptan (IMITREX) tablet 100 mg  100 mg Oral Q2H PRN Bobette Mo, MD       torsemide [facility]) tablet 20 mg  20 mg Oral Lamont Snowball, MD   20 mg at 03/07/23 1049     LABORATORY DATA:  I have reviewed the data as listed Lab Results  Component Value Date   WBC 0.8 (LL) 03/08/2023   HGB 6.7 (LL) 03/08/2023   HCT 22.4 (L) 03/08/2023   MCV 99.1 03/08/2023   PLT 65 (L) 03/08/2023   Recent Labs    09/25/22 1309 01/23/23 1146 03/06/23 1140 03/07/23 0456 03/08/23 0526  NA 138   < > 129* 130* 131*  K  --    < > 3.6 3.9 3.1*  CL  --    < > 90* 91* 92*  CO2  --    < > 26 27 28   GLUCOSE  --    < > 296* 146* 118*  BUN  --    < > 18 14 20   CREATININE  --    < > 0.82 0.70 0.96  CALCIUM  --    < > 8.1* 8.1* 7.4*  GFRNONAA  --     < > >60 >60 >60  PROT 7.8  --   --  7.4 6.4*  ALBUMIN 3.8  --   --  2.6* 2.2*  AST 36  --   --  22 19  ALT 14  --   --  9 6  ALKPHOS 74  --   --  82 74  BILITOT 0.8  --   --  1.6* 1.2  BILIDIR 0.1  --   --   --   --   IBILI 0.7  --   --   --   --    < > = values in this interval not displayed.    RADIOGRAPHIC STUDIES: I have personally reviewed the radiological images as listed and agreed with the findings in the report. MR BRAIN WO CONTRAST Result Date: 03/08/2023 CLINICAL DATA:  Neuro deficit, acute, stroke suspected. EXAM: MRI HEAD WITHOUT CONTRAST TECHNIQUE: Multiplanar, multiecho pulse sequences of the brain and surrounding structures were obtained without intravenous contrast. COMPARISON:  Head CT March 06, 2023; MRI brain September 06, 2020. FINDINGS: Brain: Trace subdural hematoma is seen along the posterior falx and left temporal occipital region. No mass effect. No acute infarction, hemorrhage, hydrocephalus or mass lesion. Few scattered foci of T2 hyperintensity are seen within the white matter of the cerebral hemispheres, nonspecific. Vascular: Normal flow voids. Skull and upper cervical spine: Normal marrow signal. Sinuses/Orbits: Negative. Other: Right parietal scalp hematoma measuring approximately 3.5 x 1.1 cm. IMPRESSION: 1. Trace subdural hematoma along the posterior falx and left temporal occipital region. No mass effect. 2. Right parietal scalp hematoma. 3. Few scattered foci of T2 hyperintensity within the white matter of the cerebral hemispheres, nonspecific but may  represent early chronic microvascular ischemic changes. Electronically Signed   By: Baldemar Lenis M.D.   On: 03/08/2023 10:32   DG CHEST PORT 1 VIEW Result Date: 03/07/2023 CLINICAL DATA:  Shortness of breath.  Aspiration pneumonia. EXAM: PORTABLE CHEST 1 VIEW COMPARISON:  03/06/2023 FINDINGS: Left lower lobe retrocardiac airspace opacity compatible with pneumonia, with reduced aeration and reduced  definition of the left hemidiaphragm compared to 03/06/2023. Low lung volumes are present, causing crowding of the pulmonary vasculature. Mild enlargement of the cardiopericardial silhouette, without edema. Right lung appears grossly clear. IMPRESSION: 1. Left lower lobe pneumonia with reduced aeration compared to previous. 2. Low lung volumes. 3. Mild enlargement of the cardiopericardial silhouette, without edema. Electronically Signed   By: Gaylyn Rong M.D.   On: 03/07/2023 12:37   CT Head Wo Contrast Addendum Date: 03/06/2023 ADDENDUM REPORT: 03/06/2023 13:12 ADDENDUM: Study discussed by telephone with PA KAYLA KEITH on 03/06/2023 at 1251 hours. Electronically Signed   By: Odessa Fleming M.D.   On: 03/06/2023 13:12   Result Date: 03/06/2023 CLINICAL DATA:  63 year old female status post fall, found down. Posterior head hematoma. EXAM: CT HEAD WITHOUT CONTRAST TECHNIQUE: Contiguous axial images were obtained from the base of the skull through the vertex without intravenous contrast. RADIATION DOSE REDUCTION: This exam was performed according to the departmental dose-optimization program which includes automated exposure control, adjustment of the mA and/or kV according to patient size and/or use of iterative reconstruction technique. COMPARISON:  Brain MRI 09/06/2020.  Head CT 06/09/2016. FINDINGS: Brain: Small volume posterior para falcine subdural blood on series 6, image 61 measures up to 3 mm. No other acute intracranial hemorrhage identified. No IVH or ventriculomegaly. No midline shift, mass effect, or evidence of intracranial mass lesion. No cortically based acute infarct identified. Stable gray-white matter differentiation throughout the brain. Vascular: No suspicious intracranial vascular hyperdensity. Skull: No fracture identified. Sinuses/Orbits: Visualized paranasal sinuses and mastoids are stable and well aerated. Other: Broad-based right posterior and superior scalp hematoma, up to 2 cm in  thickness. Underlying calvarium appears stable and intact. Visualized orbit soft tissues are within normal limits. Traumatic Brain Injury Risk Stratification Skull Fracture: No - Low/mBIG 1 Subdural Hematoma (SDH): <72mm - mBIG 1 Subarachnoid Hemorrhage [facility]): No Epidural Hematoma (EDH): No - Low/mBIG 1 Cerebral contusion, intra-axial, intraparenchymal Hemorrhage (IPH): No Intraventricular Hemorrhage (IVH): No - Low/mBIG 1 Midline Shift > 1mm or Edema/effacement of sulci/vents: No - Low/mBIG 1 ---------------------------------------------------- IMPRESSION: 1. Large posterior scalp hematoma with no underlying skull fracture, but Positive for a small para-falcine Subdural Hematoma, up to 3 mm. 2. No intracranial mass effect. No other acute intracranial abnormality. Electronically Signed: By: Odessa Fleming M.D. On: 03/06/2023 12:47   DG Chest 2 View Result Date: 03/06/2023 CLINICAL DATA:  63 year old female status post fall, found down. Posterior head hematoma. EXAM: CHEST - 2 VIEW COMPARISON:  CT Cervical spine today, Chest radiographs 01/23/2023. FINDINGS: Semi upright AP and lateral views at 1223 hours. Confluent airspace opacity, evidence of consolidation in the left lower lobe on both views. Small pleural effusion difficult to exclude. Similar lung volumes to last month. Mediastinal contours are stable and within normal limits. Visualized tracheal air column is within normal limits. The right lung appears to remain clear. No acute osseous abnormality identified. Negative visible bowel gas. Abdomen Calcified aortic atherosclerosis. IMPRESSION: Left lower lobe consolidation, new from last month. Consider Aspiration and/or Pneumonia in this setting. Small left pleural effusion is possible. Electronically Signed   By: Rexene Edison  Margo Aye M.D.   On: 03/06/2023 12:54   CT Cervical Spine Wo Contrast Result Date: 03/06/2023 CLINICAL DATA:  63 year old female status post fall, found down. Posterior head hematoma. EXAM: CT CERVICAL  SPINE WITHOUT CONTRAST TECHNIQUE: Multidetector CT imaging of the cervical spine was performed without intravenous contrast. Multiplanar CT image reconstructions were also generated. RADIATION DOSE REDUCTION: This exam was performed according to the departmental dose-optimization program which includes automated exposure control, adjustment of the mA and/or kV according to patient size and/or use of iterative reconstruction technique. COMPARISON:  Head CT today reported separately.  Neck CT 03/27/2020. FINDINGS: Alignment: Improved cervical lordosis compared to 2022. Cervicothoracic junction alignment is within normal limits. Bilateral posterior element alignment is within normal limits. Skull base and vertebrae: Mild motion artifact. Bone mineralization is within normal limits. Visualized skull base is intact. No atlanto-occipital dissociation. C1 and C2 appear intact and aligned. No acute osseous abnormality identified. Soft tissues and spinal canal: No prevertebral fluid or swelling. No visible canal hematoma. Negative visible noncontrast neck soft tissues. Disc levels: Chronic C5-C6 and C6-C7 disc and endplate degeneration does not appear significantly changed from 2022. Upper chest: Mild motion artifact, otherwise negative. IMPRESSION: 1. No acute traumatic injury identified in the cervical spine. 2. Chronic C5-C6 and C6-C7 disc and endplate degeneration. Electronically Signed   By: Odessa Fleming M.D.   On: 03/06/2023 12:50     The total time spent in the appointment was 55 minutes encounter with patients including review of chart and various tests results, discussions about plan of care and coordination of care plan   All questions were answered. The patient knows to call the clinic with any problems, questions or concerns. No barriers to learning was detected.  Attending Note  I personally saw the patient, reviewed the chart and examined the patient. The plan of care was discussed with the patient and the  admitting team. I agree with the assessment and plan as documented above. Thank you very much for the consultation. I saw the patient, it appears that family was worried that she isnt followed by Hem onc while inpatient and they felt like they were not heard when they spoke to multiple providers about her AML and the fact that she is on a clinical trial. She was working with speech therapy and eating apple puree at the time of my visit. She continues to have pain at the impact site and some neck pain as well. No bleeding from any other site. PRBC ongoing. She is now on her 2 week off ( she is on aza/venetoclax combination) Given possibility of worsening cytopenia, I would recommend monitoring CBC daily and transfuse to maintain a Hb of 7 and plts of 50K ( I would recommend discussing with neurosurgery if they feel like a higher platelet goal is recommended given hematoma) Await bed transfer, family feels comfortable at [facility] Since this is where her primary Hem/onc is.  Reasonable to consider abx until cultures are confirmed. No role for GCSF. We will continue to be available as needed.  Dietrich Pates Rouson, NP 1/28/202511:26 AM

## 2023-03-08 NOTE — Plan of Care (Signed)

## 2023-03-08 NOTE — Progress Notes (Signed)
Chaplain received a page that [name] and her family had prepared the document to assign her children to be her HCPOA. Heather Haley was oriented today and had capacity to make these decisions and had a clear understanding of the documents.  Chaplain assisted with getting documents notarized and witnessed.  The original was returned to [place], along with a copy for her designees.  A copy was also placed in her chart and scanned to ACP documents.  [address] [place] Pager, [phone]

## 2023-03-08 NOTE — Progress Notes (Addendum)
PROGRESS NOTE    Heather Haley  UJW:[id number] DOB: [birth year] DOA: 03/06/2023 PCP: Patient, No Pcp Per  Chief Complaint  Patient presents with   Fall    Brief Narrative:   63 yo with AML, drug induced parkinsonism, mood disorder/PTSD/ schizoaffective disorder and multiple other medical issues here after [facility] fall, now found to have febrile neutropenia.  Per discussion with her family, she's had confusion since she started he ventoclax, worse in the past week or so.  Admitted with acute metabolic encephalopathy, now found to have febrile neutropenia.  Seems to be improving on abx.  Oncology on board.  [facility] oncology had requested [facility] transfer, this is underway.    Assessment & Plan:   Principal Problem:   Aspiration pneumonia (HCC) Active Problems:   Hypertension   Chronic constipation   Diabetic sensory peripheral neuropathy (Bilateral Lower Extremity)   Generalized anxiety disorder   History of panic attacks   GERD (gastroesophageal reflux disease)   Non-insulin dependent type 2 diabetes mellitus (HCC)   Pure hypercholesterolemia   MDD (major depressive disorder), recurrent, severe, with psychosis (HCC)   Schizoaffective disorder, depressive type (HCC)   PTSD (post-traumatic stress disorder)   Drug-induced Parkinson's disease (HCC)   OSA (obstructive sleep apnea)   Pancytopenia (HCC)   Class 2 obesity   Physical deconditioning   Hematoma of occipital region of scalp   Acute myeloid leukemia not having achieved remission (HCC)  Heather Jermany Rimel NP from [facility] requesting transfer given this is Heather [facility]aley study patient.  Underway.  Acute Hypoxic Respiratory Failure Febrile Neutropenia Sepsis due to Community Acquired Pneumonia On 10 L [place] CXR with LLL consolidation Zosyn, vanc d/c'd with negative MRSA PCR CT chest and abdomen/pelvis pending Per discussion with oncology at [facility], no granix Appreciate oncology assistance here  Acute Metabolic Encephalopathy Myoclonic  Jerks Hyperreflexia Suspect this is related to her underlying infection above Tremors, myoclonic jerks seems improved.  Head CT with large posterior scalp hematoma with no underlying skull fracture, small parafalcine subdural hematoma Venous blood gas without hypercarbia Ammonia normal, TSH normal, vitamin b12 normal Will get EEG pending MRI brain with trace subdural hematoma along posterior falx and L temporal occipital region, R parietal scalp hematoma, possible chronic microvascular ischemic changes (see report) Consider additional neuro imaging  Treating with abx as noted above - she seems to be improving  Fall  Subdural Hematoma  Posterior Scalp Hematoma Per neurosurgery, no need for intervention or follow up  Once more stable and able to participate, will need therapy orders  Acute Myeloid Leukemia Pancytopenia  Currently on venetoclax Transfuse for Hb <7 (hb down to 6.7 today, she's symptomatic as well with LH) Transfuse platelets for <50  Oncology from [facility] requesting transfer, there's not currently any beds available  Urinary Retention Foley in place   Chronic Pain Continue butrans patch Will reduce oxy dose to 2.5 given her confusion  Drug Induced Parkinsonism Continue sinemet  T2DM Peripheral  Neuropathy  PTSD Schizoaffective Disorder Mood Disorder Zyprexa, amitriptyline  OSA CPAP  RLS Ropinirole  Obesity Body mass index is 39.91 kg/m.     DVT prophylaxis: SCD Code Status: full Family Communication: daughter  Disposition:   Status is: Inpatient Remains inpatient appropriate because: need for inpatient care   Consultants:  none  Procedures:  none  Antimicrobials:  Anti-infectives (From admission, onward)    Start     Dose/Rate Route Frequency Ordered Stop   03/08/23 1400  vancomycin (VANCOREADY) IVPB 1500 mg/300 mL  Status:  Discontinued        1,500 mg 150 mL/hr over 120 Minutes Intravenous Every 24 hours 03/07/23 1352 03/07/23  1719   03/07/23 1245  vancomycin (VANCOREADY) IVPB 2000 mg/400 mL        2,000 mg 200 mL/hr over 120 Minutes Intravenous  Once 03/07/23 1151 03/07/23 1530   03/07/23 0800  piperacillin-tazobactam (ZOSYN) IVPB 3.375 g        3.375 g 12.5 mL/hr over 240 Minutes Intravenous Every 8 hours 03/07/23 0729     03/06/23 1600  azithromycin (ZITHROMAX) 500 mg in sodium chloride 0.9 % 250 mL IVPB  Status:  Discontinued        500 mg 250 mL/hr over 60 Minutes Intravenous Every 24 hours 03/06/23 1428 03/07/23 0719   03/06/23 1500  cefTRIAXone (ROCEPHIN) 2 g in sodium chloride 0.9 % 100 mL IVPB  Status:  Discontinued        2 g 200 mL/hr over 30 Minutes Intravenous Every 24 hours 03/06/23 1428 03/07/23 0719       Subjective: No new complaints Discussed with patient and daughter  Objective: Vitals:   03/08/23 0649 03/08/23 1001 03/08/23 1100 03/08/23 1112  BP: 91/60 92/64  116/69  Pulse:  75  74  Resp:    19  Temp:    97.6 F (36.4 C)  TempSrc:    Oral  SpO2:   95% 97%  Weight:      Height:        Intake/Output Summary (Last 24 hours) at 03/08/2023 1145 Last data filed at 03/08/2023 1000 Gross per 24 hour  Intake 570 ml  Output 1650 ml  Net -1080 ml   Filed Weights   03/06/23 1118  Weight: 108.8 kg    Examination:   General: No acute distress. Cardiovascular: RRR Lungs: diminished on L Neurological: alert, less lethargic today.  Tremor less prominent as well as improved myoclonus (though still present) Extremities: No clubbing or cyanosis. No edema.   Data Reviewed: I have personally reviewed following labs and imaging studies  CBC: Recent Labs  Lab 03/06/23 1140 03/07/23 0456 03/08/23 0709  WBC 1.1* 0.6* 0.8*  NEUTROABS 0.9* 0.4* 0.3*  HGB 8.8* 8.3* 6.7*  HCT 28.4* 27.7* 22.4*  MCV 97.3 101.1* 99.1  PLT 62* 63* 65*    Basic Metabolic Panel: Recent Labs  Lab 03/06/23 1140 03/07/23 0456 03/08/23 0526  NA 129* 130* 131*  K 3.6 3.9 3.1*  CL 90* 91* 92*  CO2  26 27 28   GLUCOSE 296* 146* 118*  BUN 18 14 20   CREATININE 0.82 0.70 0.96  CALCIUM 8.1* 8.1* 7.4*  MG  --   --  2.4  PHOS  --   --  3.8    GFR: Estimated Creatinine Clearance: 73.6 mL/min (by C-G formula based on SCr of 0.96 mg/dL).  Liver Function Tests: Recent Labs  Lab 03/07/23 0456 03/08/23 0526  AST 22 19  ALT 9 6  ALKPHOS 82 74  BILITOT 1.6* 1.2  PROT 7.4 6.4*  ALBUMIN 2.6* 2.2*    CBG: Recent Labs  Lab 03/07/23 1138 03/07/23 1723 03/07/23 2026 03/08/23 0732 03/08/23 1120  GLUCAP 125* 134* 167* 84 101*     Recent Results (from the past 240 hours)  Culture, blood (Routine X 2) w Reflex to ID Panel     Status: None (Preliminary result)   Collection Time: 03/07/23 11:42 AM   Specimen: BLOOD LEFT HAND  Result Value Ref Range Status   Specimen Description  BLOOD LEFT HAND  Final   Special Requests   Final    BOTTLES DRAWN AEROBIC ONLY Blood Culture results may not be optimal due to an inadequate volume of blood received in culture bottles   Culture   Final    NO GROWTH < 24 HOURS Performed at [facility], [address] [place], [place] [address]    Report Status PENDING  Incomplete  Culture, blood (Routine X 2) w Reflex to ID Panel     Status: None (Preliminary result)   Collection Time: 03/07/23 11:46 AM   Specimen: BLOOD  Result Value Ref Range Status   Specimen Description BLOOD LEFT ANTECUBITAL  Final   Special Requests   Final    BOTTLES DRAWN AEROBIC ONLY Blood Culture results may not be optimal due to an inadequate volume of blood received in culture bottles   Culture   Final    NO GROWTH < 24 HOURS Performed at [facility], [address] [place], [place] [address]    Report Status PENDING  Incomplete  Respiratory (~20 pathogens) panel by PCR     Status: None   Collection Time: 03/07/23 12:51 PM   Specimen: Nasopharyngeal Swab; Respiratory  Result Value Ref Range Status   Adenovirus NOT DETECTED NOT DETECTED Final    Coronavirus 229E NOT DETECTED NOT DETECTED Final    Comment: (NOTE) The Coronavirus on the Respiratory Panel, DOES NOT test for the novel  Coronavirus (2019 nCoV)    Coronavirus HKU1 NOT DETECTED NOT DETECTED Final   Coronavirus NL63 NOT DETECTED NOT DETECTED Final   Coronavirus OC43 NOT DETECTED NOT DETECTED Final   Metapneumovirus NOT DETECTED NOT DETECTED Final   Rhinovirus / Enterovirus NOT DETECTED NOT DETECTED Final   Influenza Daisuke Bailey NOT DETECTED NOT DETECTED Final   Influenza B NOT DETECTED NOT DETECTED Final   Parainfluenza Virus 1 NOT DETECTED NOT DETECTED Final   Parainfluenza Virus 2 NOT DETECTED NOT DETECTED Final   Parainfluenza Virus 3 NOT DETECTED NOT DETECTED Final   Parainfluenza Virus 4 NOT DETECTED NOT DETECTED Final   Respiratory Syncytial Virus NOT DETECTED NOT DETECTED Final   Bordetella pertussis NOT DETECTED NOT DETECTED Final   Bordetella Parapertussis NOT DETECTED NOT DETECTED Final   Chlamydophila pneumoniae NOT DETECTED NOT DETECTED Final   Mycoplasma pneumoniae NOT DETECTED NOT DETECTED Final    Comment: Performed at [facility], [address], [place], [place] [address]  MRSA Next Gen by PCR, Nasal     Status: None   Collection Time: 03/07/23 12:51 PM   Specimen: Nasal Mucosa; Nasal Swab  Result Value Ref Range Status   MRSA by PCR Next Gen NOT DETECTED NOT DETECTED Final    Comment: (NOTE) The GeneXpert MRSA Assay (FDA approved for NASAL specimens only), is one component of Andriy Sherk comprehensive MRSA colonization surveillance program. It is not intended to diagnose MRSA infection nor to guide or monitor treatment for MRSA infections. Test performance is not FDA approved in patients less than 33 years old. Performed at [facility], [address] [place], [place] [address]   SARS Coronavirus 2 by RT PCR (hospital order, performed in [facility] hospital lab) *cepheid single result test*     Status: None   Collection Time: 03/07/23  12:51 PM  Result Value Ref Range Status   SARS Coronavirus 2 by RT PCR NEGATIVE NEGATIVE Final    Comment: (NOTE) SARS-CoV-2 target nucleic acids are NOT DETECTED.  The  SARS-CoV-2 RNA is generally detectable in upper and lower respiratory specimens during the acute phase of infection. The lowest concentration of SARS-CoV-2 viral copies this assay can detect is 250 copies / mL. Tong[place] Prout negative result does not preclude SARS-CoV-2 infection and should not be used as the sole basis for treatment or other patient management decisions.  Edwin Baines negative result may occur with improper specimen collection / handling, submission of specimen other than nasopharyngeal swab, presence of viral mutation(s) within the areas targeted by this assay, and inadequate number of viral copies (<250 copies / mL). Canon Gola negative result must be combined with clinical observations, patient history, and epidemiological information.  Fact Sheet for Patients:   RoadLapTop.co.za  Fact Sheet for Healthcare Providers: http://kim-miller.com/  This test is not yet approved or  cleared by the [place] FDA and has been authorized for detection and/or diagnosis of SARS-CoV-2 by FDA under an Emergency Use Authorization (EUA).  This EUA will remain in effect (meaning this test can be used) for the duration of the COVID-19 declaration under Section 564(b)(1) of the Act, 21 U.S.C. section 360bbb-3(b)(1), unless the authorization is terminated or revoked sooner.  Performed at [facility], [address], [place], [place] [address]          Radiology Studies: MR BRAIN WO CONTRAST Result Date: 03/08/2023 CLINICAL DATA:  Neuro deficit, acute, stroke suspected. EXAM: MRI HEAD WITHOUT CONTRAST TECHNIQUE: Multiplanar, multiecho pulse sequences of the brain and surrounding structures were obtained without intravenous contrast. COMPARISON:  Head CT March 06, 2023; MRI  brain September 06, 2020. FINDINGS: Brain: Trace subdural hematoma is seen along the posterior falx and left temporal occipital region. No mass effect. No acute infarction, hemorrhage, hydrocephalus or mass lesion. Few scattered foci of T2 hyperintensity are seen within the white matter of the cerebral hemispheres, nonspecific. Vascular: Normal flow voids. Skull and upper cervical spine: Normal marrow signal. Sinuses/Orbits: Negative. Other: Right parietal scalp hematoma measuring approximately 3.5 x 1.1 cm. IMPRESSION: 1. Trace subdural hematoma along the posterior falx and left temporal occipital region. No mass effect. 2. Right parietal scalp hematoma. 3. Few scattered foci of T2 hyperintensity within the white matter of the cerebral hemispheres, nonspecific but may represent early chronic microvascular ischemic changes. Electronically Signed   By: Baldemar Lenis M.D.   On: 03/08/2023 10:32   DG CHEST PORT 1 VIEW Result Date: 03/07/2023 CLINICAL DATA:  Shortness of breath.  Aspiration pneumonia. EXAM: PORTABLE CHEST 1 VIEW COMPARISON:  03/06/2023 FINDINGS: Left lower lobe retrocardiac airspace opacity compatible with pneumonia, with reduced aeration and reduced definition of the left hemidiaphragm compared to 03/06/2023. Low lung volumes are present, causing crowding of the pulmonary vasculature. Mild enlargement of the cardiopericardial silhouette, without edema. Right lung appears grossly clear. IMPRESSION: 1. Left lower lobe pneumonia with reduced aeration compared to previous. 2. Low lung volumes. 3. Mild enlargement of the cardiopericardial silhouette, without edema. Electronically Signed   By: Gaylyn Rong M.D.   On: 03/07/2023 12:37   CT Head Wo Contrast Addendum Date: 03/06/2023 ADDENDUM REPORT: 03/06/2023 13:12 ADDENDUM: Study discussed by telephone with PA KAYLA KEITH on 03/06/2023 at 1251 hours. Electronically Signed   By: Odessa Fleming M.D.   On: 03/06/2023 13:12   Result Date:  03/06/2023 CLINICAL DATA:  63 year old female status post fall, found down. Posterior head hematoma. EXAM: CT HEAD WITHOUT CONTRAST TECHNIQUE: Contiguous axial images were obtained from the base of the skull through the vertex without intravenous contrast. RADIATION DOSE  REDUCTION: This exam was performed according to the departmental dose-optimization program which includes automated exposure control, adjustment of the mA and/or kV according to patient size and/or use of iterative reconstruction technique. COMPARISON:  Brain MRI 09/06/2020.  Head CT 06/09/2016. FINDINGS: Brain: Small volume posterior para falcine subdural blood on series 6, image 61 measures up to 3 mm. No other acute intracranial hemorrhage identified. No IVH or ventriculomegaly. No midline shift, mass effect, or evidence of intracranial mass lesion. No cortically based acute infarct identified. Stable gray-white matter differentiation throughout the brain. Vascular: No suspicious intracranial vascular hyperdensity. Skull: No fracture identified. Sinuses/Orbits: Visualized paranasal sinuses and mastoids are stable and well aerated. Other: Broad-based right posterior and superior scalp hematoma, up to 2 cm in thickness. Underlying calvarium appears stable and intact. Visualized orbit soft tissues are within normal limits. Traumatic Brain Injury Risk Stratification Skull Fracture: No - Low/mBIG 1 Subdural Hematoma (SDH): <39mm - mBIG 1 Subarachnoid Hemorrhage [facility]): No Epidural Hematoma (EDH): No - Low/mBIG 1 Cerebral contusion, intra-axial, intraparenchymal Hemorrhage (IPH): No Intraventricular Hemorrhage (IVH): No - Low/mBIG 1 Midline Shift > 1mm or Edema/effacement of sulci/vents: No - Low/mBIG 1 ---------------------------------------------------- IMPRESSION: 1. Large posterior scalp hematoma with no underlying skull fracture, but Positive for Aniyah Nobis small para-falcine Subdural Hematoma, up to 3 mm. 2. No intracranial mass effect. No other acute  intracranial abnormality. Electronically Signed: By: Odessa Fleming M.D. On: 03/06/2023 12:47   DG Chest 2 View Result Date: 03/06/2023 CLINICAL DATA:  63 year old female status post fall, found down. Posterior head hematoma. EXAM: CHEST - 2 VIEW COMPARISON:  CT Cervical spine today, Chest radiographs 01/23/2023. FINDINGS: Semi upright AP and lateral views at 1223 hours. Confluent airspace opacity, evidence of consolidation in the left lower lobe on both views. Small pleural effusion difficult to exclude. Similar lung volumes to last month. Mediastinal contours are stable and within normal limits. Visualized tracheal air column is within normal limits. The right lung appears to remain clear. No acute osseous abnormality identified. Negative visible bowel gas. Abdomen Calcified aortic atherosclerosis. IMPRESSION: Left lower lobe consolidation, new from last month. Consider Aspiration and/or Pneumonia in this setting. Small left pleural effusion is possible. Electronically Signed   By: Odessa Fleming M.D.   On: 03/06/2023 12:54   CT Cervical Spine Wo Contrast Result Date: 03/06/2023 CLINICAL DATA:  63 year old female status post fall, found down. Posterior head hematoma. EXAM: CT CERVICAL SPINE WITHOUT CONTRAST TECHNIQUE: Multidetector CT imaging of the cervical spine was performed without intravenous contrast. Multiplanar CT image reconstructions were also generated. RADIATION DOSE REDUCTION: This exam was performed according to the departmental dose-optimization program which includes automated exposure control, adjustment of the mA and/or kV according to patient size and/or use of iterative reconstruction technique. COMPARISON:  Head CT today reported separately.  Neck CT 03/27/2020. FINDINGS: Alignment: Improved cervical lordosis compared to 2022. Cervicothoracic junction alignment is within normal limits. Bilateral posterior element alignment is within normal limits. Skull base and vertebrae: Mild motion artifact. Bone  mineralization is within normal limits. Visualized skull base is intact. No atlanto-occipital dissociation. C1 and C2 appear intact and aligned. No acute osseous abnormality identified. Soft tissues and spinal canal: No prevertebral fluid or swelling. No visible canal hematoma. Negative visible noncontrast neck soft tissues. Disc levels: Chronic C5-C6 and C6-C7 disc and endplate degeneration does not appear significantly changed from 2022. Upper chest: Mild motion artifact, otherwise negative. IMPRESSION: 1. No acute traumatic injury identified in the cervical spine. 2. Chronic C5-C6 and C6-C7 disc and  endplate degeneration. Electronically Signed   By: Odessa Fleming M.D.   On: 03/06/2023 12:50        Scheduled Meds:  sodium chloride   Intravenous Once   acetaminophen  1,000 mg Oral Q8H   allopurinol  300 mg Oral Daily   amitriptyline  75 mg Oral QHS   amLODipine  5 mg Oral Daily   buprenorphine  2 patch Transdermal Weekly   carbidopa-levodopa  1 tablet Oral TID   Chlorhexidine Gluconate Cloth  6 each Topical Daily   insulin aspart  0-15 Units Subcutaneous TID WC   insulin glargine-yfgn  38 Units Subcutaneous QHS   ipratropium-albuterol  3 mL Nebulization QID   isosorbide mononitrate  30 mg Oral Daily   levothyroxine  50 mcg Oral Once per day on Monday Tuesday Wednesday Thursday Friday   And   [START ON 03/12/2023] levothyroxine  75 mcg Oral Once per day on Sunday Saturday   lidocaine  1-3 patch Transdermal Q24H   magnesium oxide  800 mg Oral Daily   metFORMIN  1,000 mg Oral BID WC   metoprolol tartrate  25 mg Oral BID   OLANZapine  2.5 mg Oral QHS   mouth rinse  15 mL Mouth Rinse 4 times per day   potassium chloride  40 mEq Oral Q4H   rOPINIRole  0.5 mg Oral TID   torsemide  20 mg Oral QODAY   Continuous Infusions:  piperacillin-tazobactam (ZOSYN)  IV 3.375 g (03/08/23 0755)     LOS: 2 days    Time spent: over 30 min    Lacretia Nicks, MD [facility]   To contact the  attending provider between 7A-7P or the covering provider during after hours 7P-7A, please log into the web site [email or web address] and access using universal [place] password for that web site. If you do not have the password, please call the hospital operator.  03/08/2023, 11:45 AM

## 2023-03-09 ENCOUNTER — Inpatient Hospital Stay (HOSPITAL_COMMUNITY): Payer: Medicare HMO

## 2023-03-09 DIAGNOSIS — J690 Pneumonitis due to inhalation of food and vomit: Secondary | ICD-10-CM | POA: Diagnosis not present

## 2023-03-09 LAB — CBC WITH DIFFERENTIAL/PLATELET
Abs Immature Granulocytes: 0.01 10*3/uL (ref 0.00–0.07)
Basophils Absolute: 0 10*3/uL (ref 0.0–0.1)
Basophils Relative: 1 %
Eosinophils Absolute: 0 10*3/uL (ref 0.0–0.5)
Eosinophils Relative: 0 %
HCT: 27.5 % — ABNORMAL LOW (ref 36.0–46.0)
Hemoglobin: 8 g/dL — ABNORMAL LOW (ref 12.0–15.0)
Immature Granulocytes: 1 %
Lymphocytes Relative: 54 %
Lymphs Abs: 0.4 10*3/uL — ABNORMAL LOW (ref 0.7–4.0)
MCH: 29.1 pg (ref 26.0–34.0)
MCHC: 29.1 g/dL — ABNORMAL LOW (ref 30.0–36.0)
MCV: 100 fL (ref 80.0–100.0)
Monocytes Absolute: 0.1 10*3/uL (ref 0.1–1.0)
Monocytes Relative: 7 %
Neutro Abs: 0.3 10*3/uL — CL (ref 1.7–7.7)
Neutrophils Relative %: 37 %
Platelet Morphology: NORMAL
Platelets: 69 10*3/uL — ABNORMAL LOW (ref 150–400)
RBC: 2.75 MIL/uL — ABNORMAL LOW (ref 3.87–5.11)
RDW: 20.2 % — ABNORMAL HIGH (ref 11.5–15.5)
WBC: 0.8 10*3/uL — CL (ref 4.0–10.5)
nRBC: 6.2 % — ABNORMAL HIGH (ref 0.0–0.2)

## 2023-03-09 LAB — COMPREHENSIVE METABOLIC PANEL
ALT: 11 U/L (ref 0–44)
AST: 19 U/L (ref 15–41)
Albumin: 2.1 g/dL — ABNORMAL LOW (ref 3.5–5.0)
Alkaline Phosphatase: 81 U/L (ref 38–126)
Anion gap: 9 (ref 5–15)
BUN: 16 mg/dL (ref 8–23)
CO2: 27 mmol/L (ref 22–32)
Calcium: 7.8 mg/dL — ABNORMAL LOW (ref 8.9–10.3)
Chloride: 100 mmol/L (ref 98–111)
Creatinine, Ser: 0.64 mg/dL (ref 0.44–1.00)
GFR, Estimated: >60 mL/min (ref >60)
Glucose, Bld: 138 mg/dL — ABNORMAL HIGH (ref 70–99)
Potassium: 4.7 mmol/L (ref 3.5–5.1)
Sodium: 136 mmol/L (ref 135–145)
Total Bilirubin: 0.8 mg/dL (ref 0.0–1.2)
Total Protein: 6.5 g/dL (ref 6.5–8.1)

## 2023-03-09 LAB — TYPE AND SCREEN
ABO/RH(D): A POS
Antibody Screen: NEGATIVE
Unit division: 0

## 2023-03-09 LAB — MAGNESIUM: Magnesium: 2.5 mg/dL — ABNORMAL HIGH (ref 1.7–2.4)

## 2023-03-09 LAB — GLUCOSE, CAPILLARY
Glucose-Capillary: 131 mg/dL — ABNORMAL HIGH (ref 70–99)
Glucose-Capillary: 130 mg/dL — ABNORMAL HIGH (ref 70–99)
Glucose-Capillary: 119 mg/dL — ABNORMAL HIGH (ref 70–99)
Glucose-Capillary: 154 mg/dL — ABNORMAL HIGH (ref 70–99)

## 2023-03-09 LAB — BPAM RBC
Blood Product Expiration Date: 202502072359
ISSUE DATE / TIME: 202501281230
Unit Type and Rh: 6200

## 2023-03-09 LAB — PHOSPHORUS: Phosphorus: 3.1 mg/dL (ref 2.5–4.6)

## 2023-03-09 MED ORDER — IPRATROPIUM-ALBUTEROL 0.5-2.5 (3) MG/3ML IN SOLN
3.0000 mL | Freq: Three times a day (TID) | RESPIRATORY_TRACT | Status: DC
  Administered 2023-03-09 – 2023-03-10 (×5): 3 mL via RESPIRATORY_TRACT
  Filled 2023-03-09 (×5): qty 3

## 2023-03-09 MED ORDER — ALBUTEROL SULFATE (2.5 MG/3ML) 0.083% IN NEBU: 2.5000 mg | INHALATION_SOLUTION | RESPIRATORY_TRACT | Status: DC | PRN

## 2023-03-09 MED ORDER — POLYETHYLENE GLYCOL 3350 17 G PO PACK
17.0000 g | PACK | Freq: Every day | ORAL | Status: DC
  Administered 2023-03-09 – 2023-03-10 (×2): 17 g via ORAL
  Filled 2023-03-09 (×2): qty 1

## 2023-03-09 MED ORDER — GLYCERIN (LAXATIVE) 2 G RE SUPP: 1.0000 | Freq: Every day | RECTAL | Status: DC | PRN

## 2023-03-09 NOTE — Evaluation (Signed)
Physical Therapy Evaluation Patient Details Name: Heather Haley MRN: [id number] DOB: 03/17/1960 Today's Date: 03/09/2023  History of Present Illness  63 yo female admitted on 03/06/2023 due to fall with LOC. Pt reporting HA and head CT revealed scalp hematoma and small subdural hematoma. Pt found to have PNA. Pt has PMH including but not limited to: myeloid leukemia, OSA on CPAP, GAD, asthma, CKD, chronic pain, DM II, GERD, HLD, HTN, IBS, R ankle instability, lumbar DDD, peripheral neuropathy, schizophrenia, cardiac surgeries and L TKA.  Clinical Impression  On eval, pt required Min A +2 for safety for mobility. She was able to stand at bedside and take a few lateral steps towards [facility] with RW. Remained on [place] O2 8L-sats 91%. Pt tolerated activity fairly well. She did not wish to transfer to recliner or ambulate on today. She  reported some dizziness during session. Will plan to follow and progress activity as tolerated. Per chart and RN, plan is for transfer to [facility] once bed available.         If plan is discharge home, recommend the following: Assistance with cooking/housework;Assist for transportation;Help with stairs or ramp for entrance;A lot of help with walking and/or transfers;A lot of help with bathing/dressing/bathroom   Can travel by private vehicle        Equipment Recommendations None recommended by PT  Recommendations for Other Services       Functional Status Assessment Patient has had a recent decline in their functional status and demonstrates the ability to make significant improvements in function in a reasonable and predictable amount of time.     Precautions / Restrictions Precautions Precautions: Fall Precaution Comments: pt reports history of L knee, L ankle deficits-has L AFO + shoes in room Restrictions Weight Bearing Restrictions Per Provider Order: No      Mobility  Bed Mobility Overal bed mobility: Needs Assistance Bed Mobility: Supine to Sit, Sit to  Supine     Supine to sit: Min assist, HOB elevated, Used rails, +2 for safety/equipment Sit to supine: Contact guard assist, +2 for safety/equipment   General bed mobility comments: pt with decreased line awareness and safety, able to manage BLE, but assist with trunk    Transfers Overall transfer level: Needs assistance Equipment used: Rolling walker (2 wheels) Transfers: Sit to/from Stand Sit to Stand: Min assist, +2 safety/equipment, From elevated surface           General transfer comment: rocking momentum used, BUE up on RW despite cues. min A for steadying boost assist, +2 safety and line management    Ambulation/Gait Ambulation/Gait assistance: Min assist, +2 safety/equipment           Pre-gait activities: a few side steps along bedside with RW    Stairs            Wheelchair Mobility     Tilt Bed    Modified Rankin (Stroke Patients Only)       Balance Overall balance assessment: Needs assistance         Standing balance support: Bilateral upper extremity supported, During functional activity, Reliant on assistive device for balance Standing balance-Leahy Scale: Poor                               Pertinent Vitals/Pain Pain Assessment Pain Assessment: Faces Faces Pain Scale: Hurts little more Pain Location: L LE, back Pain Intervention(s): Limited activity within patient's tolerance, Monitored during session, Repositioned  Home Living Family/patient expects to be discharged to:: Private residence Living Arrangements: Children               Home Equipment: Agricultural consultant (2 wheels);Crutches;Cane - single point Additional Comments: some info taken from previous admission    Prior Function Prior Level of Function : Patient poor historian/Family not available             Mobility Comments: pt reports she using [place] crutch ADLs Comments: need to confirm PLOF with family     Extremity/Trunk Assessment    Upper Extremity Assessment Upper Extremity Assessment: Defer to OT evaluation    Lower Extremity Assessment Lower Extremity Assessment: Generalized weakness    Cervical / Trunk Assessment Cervical / Trunk Assessment: Normal  Communication   Communication Communication: No apparent difficulties (pt was not very talkative. reports she used to enjoy photography) Cueing Techniques: Verbal cues;Gestural cues  Cognition Arousal: Alert Behavior During Therapy: WFL for tasks assessed/performed                                            General Comments General comments (skin integrity, edema, etc.): initially upon sitting pt complaining of dizziness, and hot. no nystagmus noted - pt reports inner ear infection?    Exercises     Assessment/Plan    PT Assessment Patient needs continued PT services  PT Problem List Decreased strength;Decreased range of motion;Decreased activity tolerance;Decreased balance;Decreased mobility;Decreased safety awareness;Decreased knowledge of use of DME;Obesity;Pain       PT Treatment Interventions DME instruction;Gait training;Functional mobility training;Therapeutic activities;Therapeutic exercise;Patient/family education;Balance training    PT Goals (Current goals can be found in the Care Plan section)  Acute Rehab PT Goals Patient Stated Goal: none stated PT Goal Formulation: With patient Time For Goal Achievement: 03/23/23 Potential to Achieve Goals: Good    Frequency Min 1X/week     Co-evaluation               AM-PAC PT "6 Clicks" Mobility  Outcome Measure Help needed turning from your back to your side while in a flat bed without using bedrails?: A Little Help needed moving from lying on your back to sitting on the side of a flat bed without using bedrails?: A Little Help needed moving to and from a bed to a chair (including a wheelchair)?: A Little Help needed standing up from a chair using your arms (e.g.,  wheelchair or bedside chair)?: A Little Help needed to walk in hospital room?: A Lot Help needed climbing 3-5 steps with a railing? : A Lot 6 Click Score: 16    End of Session   Activity Tolerance: Patient limited by fatigue Patient left: in bed;with call bell/phone within reach;with bed alarm set;with nursing/sitter in room   PT Visit Diagnosis: Muscle weakness (generalized) (M62.81);Other abnormalities of gait and mobility (R26.89)    Time: 1610-9604 PT Time Calculation (min) (ACUTE ONLY): 20 min   Charges:   PT Evaluation $PT Eval Low Complexity: 1 Low   PT General Charges $$ ACUTE PT VISIT: 1 Visit            Faye Ramsay, PT Acute Rehabilitation  Office: [phone]

## 2023-03-09 NOTE — Progress Notes (Signed)
PROGRESS NOTE  CARLOTTA TELFAIR ZDG:[id number] DOB: [birth year] DOA: 03/06/2023 PCP: Patient, No Pcp Per   LOS: 3 days   Brief Narrative / Interim history: 63 yo with AML, drug induced parkinsonism, mood disorder/PTSD/ schizoaffective disorder and multiple other medical issues here after a fall, now found to have febrile neutropenia.  Per discussion with her family, she's had confusion since she started he ventoclax, worse in the past week or so. Admitted with acute metabolic encephalopathy, now found to have febrile neutropenia.  Seems to be improving on abx.  Oncology on board.  [facility] oncology had requested a transfer, this is underway.    Subjective / 24h Interval events: Doing well, could not tolerate EEG as she did not want her head touched due to hematoma on head.  Reports that she is breathing well  Assesement and Plan: Principal Problem:   Aspiration pneumonia (HCC) Active Problems:   Hypertension   Chronic constipation   Diabetic sensory peripheral neuropathy (Bilateral Lower Extremity)   Generalized anxiety disorder   History of panic attacks   GERD (gastroesophageal reflux disease)   Non-insulin dependent type 2 diabetes mellitus (HCC)   Pure hypercholesterolemia   MDD (major depressive disorder), recurrent, severe, with psychosis (HCC)   Schizoaffective disorder, depressive type (HCC)   PTSD (post-traumatic stress disorder)   Drug-induced Parkinson's disease (HCC)   Febrile neutropenia (HCC)   OSA (obstructive sleep apnea)   Pancytopenia (HCC)   Class 2 obesity   Physical deconditioning   Hematoma of occipital region of scalp   Acute myeloid leukemia not having achieved remission (HCC)   Community acquired pneumonia   Principal problem Febrile neutropenia, sepsis due to CAP, acute hypoxic respiratory failure -chest x-ray with left lower lobe consolidation, CT scan on 1/28 with multilobar pneumonia, most pronounced in the left lower lobe.  She was started on  broad-spectrum antibiotics with Zosyn, continue.  She is currently on 8 L nasal cannula, improved from admission.  Case was discussed with [facility] oncology, no [facility].  Oncology was consulted here, appreciate input meanwhile  Active problems AML, pancytopenia -supposed to be on venetoclax. She is not taking venetoclax, per oncology note daughter request to take it back home as she does not want her mother to receive any more for this cycle. Continue supportive care and transfuse for hemoglobin less than 7 as well as platelets less than 50.    We are to use leukoreduced irradiated cells for transfusions.  Case discussed with [facility], she has been accepted for transfer, awaiting bed.  Accepting MD is Dr. Melrose Nakayama  Acute Metabolic Encephalopathy, Myoclonic Jerks, Hyperreflexia - Suspect this is related to her underlying infection above. Tremors, myoclonic jerks seems improved. Head CT with large posterior scalp hematoma with no underlying skull fracture, small parafalcine subdural hematoma.  Ammonia, TSH, B12 normal. MRI brain with trace subdural hematoma along posterior falx and L temporal occipital region, R parietal scalp hematoma, possible chronic microvascular ischemic changes (see report).  She could not tolerate EEG but doubt seizures given that she is alert and oriented x 4  Fall  Subdural Hematoma  Posterior Scalp Hematoma - Per neurosurgery, no need for intervention or follow up    Urinary Retention - Foley in place    Chronic Pain - Continue butrans patch  Drug Induced Parkinsonism - Continue sinemet   PTSD, Schizoaffective Disorder, Mood Disorder - Zyprexa, amitriptyline   OSA - CPAP   RLS - Ropinirole  Obesity - Body mass index is 39.91 kg/m.  T2DM -  continue glargine, sliding scale  Lab Results  Component Value Date   HGBA1C 7.0 (H) 03/06/2023   CBG (last 3)  Recent Labs    03/08/23 2026 03/09/23 0744 03/09/23 1200  GLUCAP 157* 131* 130*      Scheduled Meds:  allopurinol   300 mg Oral Daily   amitriptyline  75 mg Oral QHS   amLODipine  5 mg Oral Daily   buprenorphine  2 patch Transdermal Weekly   carbidopa-levodopa  1 tablet Oral TID   Chlorhexidine Gluconate Cloth  6 each Topical Daily   insulin aspart  0-15 Units Subcutaneous TID WC   insulin glargine-yfgn  38 Units Subcutaneous QHS   ipratropium-albuterol  3 mL Nebulization TID   isosorbide mononitrate  30 mg Oral Daily   levothyroxine  50 mcg Oral Once per day on Monday Tuesday Wednesday Thursday Friday   And   [START ON 03/12/2023] levothyroxine  75 mcg Oral Once per day on Sunday Saturday   lidocaine  1-3 patch Transdermal Q24H   magnesium oxide  800 mg Oral Daily   metFORMIN  1,000 mg Oral BID WC   metoprolol tartrate  25 mg Oral BID   OLANZapine  2.5 mg Oral QHS   mouth rinse  15 mL Mouth Rinse 4 times per day   rOPINIRole  0.5 mg Oral TID   torsemide  20 mg Oral QODAY   Continuous Infusions:  piperacillin-tazobactam (ZOSYN)  IV 3.375 g (03/09/23 0817)   PRN Meds:.[COMPLETED] acetaminophen **FOLLOWED BY** acetaminophen, albuterol, hydrOXYzine, lip balm, ondansetron **OR** ondansetron (ZOFRAN) IV, mouth rinse, oxyCODONE, SUMAtriptan  Current Outpatient Medications  Medication Instructions   ACCU-CHEK GUIDE test strip No dose, route, or frequency recorded.   acetaminophen (TYLENOL) 1,000 mg, Every 6 hours PRN   allopurinol (ZYLOPRIM) 300 mg, Daily   amitriptyline (ELAVIL) 50 MG tablet TAKE 1 AND 1/2 TABLETS AT BEDTIME   amLODipine (NORVASC) 5 mg, Daily   aspirin EC 81 mg, Daily PRN   B-D ULTRAFINE III SHORT PEN 31G X 8 MM MISC Subcutaneous   buprenorphine (BUTRANS) 20 MCG/HR PTWK 1 patch, Weekly   carbidopa-levodopa (SINEMET CR) 50-200 MG tablet 1 tablet, Daily at bedtime   carbidopa-levodopa (SINEMET IR) 25-100 MG tablet 1 tablet, 3 times daily   Fiasp FlexTouch 10 Units, Daily   hydrOXYzine (ATARAX) 25-50 mg, Oral, 2 times daily PRN   isosorbide mononitrate (IMDUR) 30 mg, Daily    levothyroxine (SYNTHROID) 50 mcg, See admin instructions   levothyroxine (SYNTHROID) 75 mcg, See admin instructions   magnesium oxide (MAG-OX) 800 mg, Daily   metFORMIN (GLUCOPHAGE) 1,000 mg, 2 times daily with meals   metoprolol tartrate (LOPRESSOR) 25 mg, 2 times daily   nitroGLYCERIN (NITRODUR - DOSED IN MG/24 HR) 0.4 mg, As needed   OLANZapine (ZYPREXA) 2.5 mg, Oral, Daily at bedtime   Omega-3 Fatty Acids (FISH OIL) 1000 MG CAPS 1 capsule, Daily   ondansetron (ZOFRAN) 8 mg, 3 times daily   oxyCODONE (OXY IR/ROXICODONE) 5-10 mg, 2 times daily PRN   pravastatin (PRAVACHOL) 40 mg, Daily   rOPINIRole (REQUIP) 0.5 mg, 3 times daily   Semaglutide 3 mg, Daily   SUMAtriptan (IMITREX) 100 mg, Every 2 hours PRN   torsemide (DEMADEX) 20 mg, Every other day   Tresiba FlexTouch 38 Units, Daily   valACYclovir (VALTREX) 500 mg, Daily   venetoclax (VENCLEXTA) 400 mg, Daily   Vitamin D-3 1,000 Units, Daily    Diet Orders (From admission, onward)     Start  Ordered   03/06/23 1428  Diet heart healthy/carb modified Room service appropriate? Yes; Fluid consistency: Thin  Diet effective now       Question Answer Comment  Diet-HS Snack? Nothing   Room service appropriate? Yes   Fluid consistency: Thin      03/06/23 1428            DVT prophylaxis: SCDs Start: 03/06/23 1428   Lab Results  Component Value Date   PLT 69 (L) 03/09/2023      Code Status: Full Code  Family Communication: No family at bedside  Status is: Inpatient Remains inpatient appropriate because: Awaiting [facility] transfer  Level of care: Telemetry  Consultants:  Oncology   Objective: Vitals:   03/09/23 0333 03/09/23 0843 03/09/23 0920 03/09/23 1353  BP: 109/75 116/67 111/66   Pulse: 76 79 79   Resp: 18 19 19    Temp: 97.6 F (36.4 C) (!) 97.5 F (36.4 C) 97.7 F (36.5 C)   TempSrc: Oral Axillary Oral   SpO2: 99% 94% 99% 93%  Weight:      Height:        Intake/Output Summary (Last 24 hours) at  03/09/2023 1359 Last data filed at 03/09/2023 0900 Gross per 24 hour  Intake 1145.31 ml  Output 900 ml  Net 245.31 ml   Wt Readings from Last 3 Encounters:  03/06/23 108.8 kg  01/23/23 108.8 kg  01/23/23 108.9 kg    Examination:  Constitutional: NAD Eyes: no scleral icterus ENMT: Mucous membranes are moist.  Neck: normal, supple Respiratory: clear to auscultation bilaterally, no wheezing, no crackles. Cardiovascular: Regular rate and rhythm, no murmurs / rubs / gallops. Abdomen: non distended, no tenderness. Bowel sounds positive.  Musculoskeletal: no clubbing / cyanosis.   Data Reviewed: I have independently reviewed following labs and imaging studies   CBC Recent Labs  Lab 03/06/23 1140 03/07/23 0456 03/08/23 0709 03/09/23 0552  WBC 1.1* 0.6* 0.8* 0.8*  HGB 8.8* 8.3* 6.7* 8.0*  HCT 28.4* 27.7* 22.4* 27.5*  PLT 62* 63* 65* 69*  MCV 97.3 101.1* 99.1 100.0  MCH 30.1 30.3 29.6 29.1  MCHC 31.0 30.0 29.9* 29.1*  RDW 18.8* 19.3* 19.0* 20.2*  LYMPHSABS 0.1* 0.2* 0.3* 0.4*  MONOABS 0.0* 0.1 0.1 0.1  EOSABS 0.0 0.0 0.0 0.0  BASOSABS 0.0 0.0 0.0 0.0    Recent Labs  Lab 03/06/23 1140 03/07/23 0456 03/07/23 1142 03/08/23 0526 03/09/23 0552  NA 129* 130*  --  131* 136  K 3.6 3.9  --  3.1* 4.7  CL 90* 91*  --  92* 100  CO2 26 27  --  28 27  GLUCOSE 296* 146*  --  118* 138*  BUN 18 14  --  20 16  CREATININE 0.82 0.70  --  0.96 0.64  CALCIUM 8.1* 8.1*  --  7.4* 7.8*  AST  --  22  --  19 19  ALT  --  9  --  6 11  ALKPHOS  --  82  --  74 81  BILITOT  --  1.6*  --  1.2 0.8  ALBUMIN  --  2.6*  --  2.2* 2.1*  MG  --   --   --  2.4 2.5*  TSH  --   --  0.798  --   --   HGBA1C 7.0*  --   --   --   --   AMMONIA  --   --  14  --   --     ------------------------------------------------------------------------------------------------------------------  No results for input(s): "CHOL", "HDL", "LDLCALC", "TRIG", "CHOLHDL", "LDLDIRECT" in the last 72 hours.  Lab Results   Component Value Date   HGBA1C 7.0 (H) 03/06/2023   ------------------------------------------------------------------------------------------------------------------ Recent Labs    03/07/23 1142  TSH 0.798    Cardiac Enzymes No results for input(s): "CKMB", "TROPONINI", "MYOGLOBIN" in the last 168 hours.  Invalid input(s): "CK" ------------------------------------------------------------------------------------------------------------------    Component Value Date/Time   BNP 29.5 08/11/2022 1122    CBG: Recent Labs  Lab 03/08/23 1120 03/08/23 1627 03/08/23 2026 03/09/23 0744 03/09/23 1200  GLUCAP 101* 173* 157* 131* 130*    Recent Results (from the past 240 hours)  Urine Culture (for pregnant, neutropenic or urologic patients or patients with an indwelling urinary catheter)     Status: None   Collection Time: 03/07/23  4:32 AM   Specimen: Urine, Clean Catch  Result Value Ref Range Status   Specimen Description   Final    URINE, CLEAN CATCH Performed at [facility], [address] [place], [place] [address]    Special Requests   Final    NONE Performed at [facility], [address], [place], [place] [address]    Culture   Final    NO GROWTH Performed at [facility], [address] [place], [place] [address]    Report Status 03/08/2023 FINAL  Final  Culture, blood (Routine X 2) w Reflex to ID Panel     Status: None (Preliminary result)   Collection Time: 03/07/23 11:42 AM   Specimen: BLOOD LEFT HAND  Result Value Ref Range Status   Specimen Description BLOOD LEFT HAND  Final   Special Requests   Final    BOTTLES DRAWN AEROBIC ONLY Blood Culture results may not be optimal due to an inadequate volume of blood received in culture bottles   Culture   Final    NO GROWTH 2 DAYS Performed at [facility], [address] [place], [place] [address]    Report Status PENDING  Incomplete  Culture, blood (Routine  X 2) w Reflex to ID Panel     Status: None (Preliminary result)   Collection Time: 03/07/23 11:46 AM   Specimen: BLOOD  Result Value Ref Range Status   Specimen Description BLOOD LEFT ANTECUBITAL  Final   Special Requests   Final    BOTTLES DRAWN AEROBIC ONLY Blood Culture results may not be optimal due to an inadequate volume of blood received in culture bottles   Culture   Final    NO GROWTH 2 DAYS Performed at [facility], [address] [place], [place] [address]    Report Status PENDING  Incomplete  Respiratory (~20 pathogens) panel by PCR     Status: None   Collection Time: 03/07/23 12:51 PM   Specimen: Nasopharyngeal Swab; Respiratory  Result Value Ref Range Status   Adenovirus NOT DETECTED NOT DETECTED Final   Coronavirus 229E NOT DETECTED NOT DETECTED Final    Comment: (NOTE) The Coronavirus on the Respiratory Panel, DOES NOT test for the novel  Coronavirus (2019 nCoV)    Coronavirus HKU1 NOT DETECTED NOT DETECTED Final   Coronavirus NL63 NOT DETECTED NOT DETECTED Final   Coronavirus OC43 NOT DETECTED NOT DETECTED Final   Metapneumovirus NOT DETECTED NOT DETECTED Final   Rhinovirus / Enterovirus NOT DETECTED NOT DETECTED Final   Influenza A NOT DETECTED NOT DETECTED Final   Influenza B NOT DETECTED NOT DETECTED Final   Parainfluenza  Virus 1 NOT DETECTED NOT DETECTED Final   Parainfluenza Virus 2 NOT DETECTED NOT DETECTED Final   Parainfluenza Virus 3 NOT DETECTED NOT DETECTED Final   Parainfluenza Virus 4 NOT DETECTED NOT DETECTED Final   Respiratory Syncytial Virus NOT DETECTED NOT DETECTED Final   Bordetella pertussis NOT DETECTED NOT DETECTED Final   Bordetella Parapertussis NOT DETECTED NOT DETECTED Final   Chlamydophila pneumoniae NOT DETECTED NOT DETECTED Final   Mycoplasma pneumoniae NOT DETECTED NOT DETECTED Final    Comment: Performed at [facility], [address], [place], [place] [address]  MRSA Next Gen by PCR, Nasal     Status: None    Collection Time: 03/07/23 12:51 PM   Specimen: Nasal Mucosa; Nasal Swab  Result Value Ref Range Status   MRSA by PCR Next Gen NOT DETECTED NOT DETECTED Final    Comment: (NOTE) The GeneXpert MRSA Assay (FDA approved for NASAL specimens only), is one component of a comprehensive MRSA colonization surveillance program. It is not intended to diagnose MRSA infection nor to guide or monitor treatment for MRSA infections. Test performance is not FDA approved in patients less than 80 years old. Performed at [facility], [address] [place], [place] [address]   SARS Coronavirus 2 by RT PCR (hospital order, performed in [facility] hospital lab) *cepheid single result test*     Status: None   Collection Time: 03/07/23 12:51 PM  Result Value Ref Range Status   SARS Coronavirus 2 by RT PCR NEGATIVE NEGATIVE Final    Comment: (NOTE) SARS-CoV-2 target nucleic acids are NOT DETECTED.  The SARS-CoV-2 RNA is generally detectable in upper and lower respiratory specimens during the acute phase of infection. The lowest concentration of SARS-CoV-2 viral copies this assay can detect is 250 copies / mL. A negative result does not preclude SARS-CoV-2 infection and should not be used as the sole basis for treatment or other patient management decisions.  A negative result may occur with improper specimen collection / handling, submission of specimen other than nasopharyngeal swab, presence of viral mutation(s) within the areas targeted by this assay, and inadequate number of viral copies (<250 copies / mL). A negative result must be combined with clinical observations, patient history, and epidemiological information.  Fact Sheet for Patients:   RoadLapTop.co.za  Fact Sheet for Healthcare Providers: http://kim-miller.com/  This test is not yet approved or  cleared by the [place] FDA and has been authorized for detection and/or  diagnosis of SARS-CoV-2 by FDA under an Emergency Use Authorization (EUA).  This EUA will remain in effect (meaning this test can be used) for the duration of the COVID-19 declaration under Section 564(b)(1) of the Act, 21 U.S.C. section 360bbb-3(b)(1), unless the authorization is terminated or revoked sooner.  Performed at [facility], [address] [place], [place] [address]      Radiology Studies: DG Swallowing Func-Speech Pathology Result Date: 03/09/2023 Table formatting from the original result was not included. Modified Barium Swallow Study Patient Details Name: MADINAH QUARRY MRN: [id number] Date of Birth: January 20, 1961 Today's Date: 03/09/2023 HPI/PMH: HPI: 63yo female admitted 03/06/23 after a fall at home. PMH: leukemia, anxiety, depression, GERD, schizoaffective disorder, schiophrenia, PTSD, insomnia, tobacco abuse, drug-induced Parkinson's dz, asthma, carpal tunner, CKD, chronic low back pain, pancytopenia, nephrolithiasis, HLT, HTN, IBS, neuromuscular d/o, DM with peripheral neuropathy, PONV. Steady decline in mental status in recent months per son. CXR - LLL consolidation. MRI = trace SDH posterior falx and left  temporo-occipital region. MBS 03/2018 = no pen/asp, reg/thin. CXR = LLL PNA, right lung clear. Clinical Impression: Clinical Impression: Patient present with minimal pharyngeal dysphagia resulting in trace aspiration of thin liquids - most notably with sequential swallows.  Chin tuck posture decr to trace penetration of thin with straw usage.  Pharyngeal swallow was largely strong without significant retention.     Barium tablet given with nectar thick easily transited through pharynx and into esophagus.  However it appeared to briefly halt at distal esophagus with nectar barium stasis above it.   Pt notable for referrent sensation to pharynx. SLP provided pudding barium to aid clearance but esophagus was clear upon esophageal sweep.   Suspect potential  dysmotlity component, radiologist not present for evaluation and MBS does not diagnose below PES.  Educated pt to findings/recommendations using teach back following exam. The esophagus was normal. Scattered moderate inflammation characterized by erythema and  granularity was found in the cardia and in the gastric body. On prior endoscopy. 2016. Factors that may increase risk of adverse event in presence of aspiration Rubye Oaks & Clearance Coots 2021): Factors that may increase risk of adverse event in presence of aspiration Rubye Oaks & Clearance Coots 2021): Frail or deconditioned Recommendations/Plan: Swallowing Evaluation Recommendations Swallowing Evaluation Recommendations Recommendations: PO diet PO Diet Recommendation: Regular; Thin liquids (Level 0) (for pt to order foods she can mange - advised her she MUST order her own meals) Liquid Administration via: Cup; Straw Medication Administration: Whole meds with liquid (with nectar/Ensure and applesauce) Supervision: Patient able to self-feed Swallowing strategies  : Slow rate; Small bites/sips Postural changes: Stay upright 30-60 min after meals; Position pt fully upright for meals Oral care recommendations: Oral care BID (2x/day) Treatment Plan Treatment Plan Treatment recommendations: Therapy as outlined in treatment plan below Follow-up recommendations: Skilled nursing-short term rehab (<3 hours/day) Functional status assessment: Patient has had a recent decline in their functional status and demonstrates the ability to make significant improvements in function in a reasonable and predictable amount of time. Treatment frequency: Min 1x/week Treatment duration: 1 week Interventions: Aspiration precaution training; Patient/family education; Compensatory techniques Recommendations Recommendations for follow up therapy are one component of a multi-disciplinary discharge planning process, led by the attending physician.  Recommendations may be updated based on patient status,  additional functional criteria and insurance authorization. Assessment: Orofacial Exam: Orofacial Exam Oral Cavity - Dentition: Missing dentition; Other (Comment) (few lower dentition - pt waiting to get upper dentures) Orofacial Anatomy: [facility] Oral Motor/Sensory Function: WFL Anatomy: Anatomy: WFL Boluses Administered: Boluses Administered Boluses Administered: Thin liquids (Level 0); Mildly thick liquids (Level 2, nectar thick); Puree; Solid  Oral Impairment Domain: Oral Impairment Domain Lip Closure: No labial escape Tongue control during bolus hold: Posterior escape of less than half of bolus Bolus preparation/mastication: Slow prolonged chewing/mashing with complete recollection Bolus transport/lingual motion: Brisk tongue motion Oral residue: Trace residue lining oral structures Location of oral residue : Tongue Initiation of pharyngeal swallow : Valleculae; Pyriform sinuses  Pharyngeal Impairment Domain: Pharyngeal Impairment Domain Soft palate elevation: No bolus between soft palate (SP)/pharyngeal wall (PW) Laryngeal elevation: Partial superior movement of thyroid cartilage/partial approximation of arytenoids to epiglottic petiole Anterior hyoid excursion: Partial anterior movement Epiglottic movement: Complete inversion Laryngeal vestibule closure: Incomplete, narrow column air/contrast in laryngeal vestibule Pharyngeal stripping wave : Present - complete Pharyngeal contraction (A/P view only): N/A Pharyngoesophageal segment opening: Complete distension and complete duration, no obstruction of flow Tongue base retraction: Trace column of contrast or air between tongue base and PPW Pharyngeal residue:  Trace residue within or on pharyngeal structures Location of pharyngeal residue: Valleculae; Pyriform sinuses; Aryepiglottic folds  Esophageal Impairment Domain: Esophageal Impairment Domain Esophageal clearance upright position: Esophageal retention (Barium tablet taken with nectar appeared to briefly halt at  distal esophagus with nectar barium stasis above it - pt having referrent sensation to pharynx.  SLP provided pudding barium to aid clearance but esophagus was clear upon esophageal sweep) Pill: Pill Consistency administered: Mildly thick liquids (Level 2, nectar thick) Mildly thick liquids (Level 2, nectar thick): WFL Penetration/Aspiration Scale Score: Penetration/Aspiration Scale Score 1.  Material does not enter airway: Mildly thick liquids (Level 2, nectar thick); Puree; Solid; Pill 3.  Material enters airway, remains ABOVE vocal cords and not ejected out: Thin liquids (Level 0) 8.  Material enters airway, passes BELOW cords without attempt by patient to eject out (silent aspiration) : Thin liquids (Level 0) Compensatory Strategies: Compensatory Strategies Compensatory strategies: Yes Chin tuck: Ineffective; Effective Effective Chin Tuck: Thin liquid (Level 0) Ineffective Chin Tuck: Thin liquid (Level 0) Other(comment): Effective (single boluses with liquids effective)   General Information: Caregiver present: No  Diet Prior to this Study: Regular; Thin liquids (Level 0)   Temperature : Normal   Respiratory Status: WFL   Supplemental O2: Nasal cannula   History of Recent Intubation: No  Behavior/Cognition: Alert; Cooperative; Pleasant mood Self-Feeding Abilities: Able to self-feed Baseline vocal quality/speech: Normal Volitional Cough: Able to elicit Volitional Swallow: Unable to elicit Exam Limitations: No limitations Goal Planning: Prognosis for improved oropharyngeal function: Fair Barriers to Reach Goals: Other (Comment); Overall medical prognosis (medical diagnosis) No data recorded Patient/Family Stated Goal: none stated Consulted and agree with results and recommendations: Patient; Nurse Pain: Pain Assessment Pain Assessment: Faces Pain Score: 9 Faces Pain Scale: 4 Pain Location: L LE, back Pain Descriptors / Indicators: Constant Pain Intervention(s): Limited activity within patient's tolerance;  Monitored during session; Repositioned End of Session: Start Time:SLP Start Time (ACUTE ONLY): 1107 Stop Time: SLP Stop Time (ACUTE ONLY): 1145 Time Calculation:SLP Time Calculation (min) (ACUTE ONLY): 38 min Charges: SLP Evaluations $ SLP Speech Visit: 1 Visit SLP Evaluations $BSS Swallow: 1 Procedure $MBS Swallow: 1 Procedure $Swallowing Treatment: 1 Procedure SLP visit diagnosis: SLP Visit Diagnosis: Dysphagia, pharyngeal phase (R13.13); Dysphagia, unspecified (R13.10) Past Medical History: Past Medical History: Diagnosis Date  Anxiety   Asthma   Chronic kidney disease   Chronic pain   previously saw Dr. Shireen Quan in pain clinic, then saw pain specialist in [facility]  Depression   Diabetes mellitus (HCC)   Frequency of urination   GERD (gastroesophageal reflux disease)   Headache(784.0)   High cholesterol   History of kidney stones   Hypertension   IBS (irritable bowel syndrome)   Left ankle instability   Left knee DJD   Lumbar Degenerative Disc Disease of  10/11/2014  Neuromuscular disorder (HCC)   Osteoarthritis of hip (Right) 05/05/2015  Other enthesopathy of ankle and tarsus 12/15/2009  Qualifier: Diagnosis of  By: Darrick Penna MD, KARL    Parkinson's disease (HCC)   Peripheral sensory neuropathy (Bilateral) 11/19/2014  Postoperative nausea and vomiting   Schizophrenia (HCC)  Past Surgical History: Past Surgical History: Procedure Laterality Date  ABDOMINAL HYSTERECTOMY    ANKLE SURGERY Left   x 2  APPENDECTOMY    CARDIAC CATHETERIZATION    COLONOSCOPY  2013  COLONOSCOPY WITH PROPOFOL N/A 04/25/2017  Procedure: COLONOSCOPY WITH PROPOFOL;  Surgeon: Scot Jun, MD;  Location: [facility] ENDOSCOPY;  Service: Endoscopy;  Laterality: N/A;  COLONOSCOPY WITH PROPOFOL  N/A 05/25/2022  Procedure: COLONOSCOPY WITH PROPOFOL;  Surgeon: [name], MD;  Location: [facility] ENDOSCOPY;  Service: Endoscopy;  Laterality: N/A;  CYSTOSCOPY/URETEROSCOPY/HOLMIUM LASER/STENT PLACEMENT Left 09/11/2019  Procedure:  CYSTOSCOPY/URETEROSCOPY/HOLMIUM LASER/STENT PLACEMENT;  Surgeon: Riki Altes, MD;  Location: [facility] ORS;  Service: Urology;  Laterality: Left;  ESOPHAGOGASTRODUODENOSCOPY (EGD) WITH PROPOFOL N/A 10/03/2014  Procedure: ESOPHAGOGASTRODUODENOSCOPY (EGD) WITH PROPOFOL;  Surgeon: Elnita Maxwell, MD;  Location: [facility] ENDOSCOPY;  Service: Endoscopy;  Laterality: N/A;  ESOPHAGOGASTRODUODENOSCOPY (EGD) WITH PROPOFOL  04/25/2017  Procedure: ESOPHAGOGASTRODUODENOSCOPY (EGD) WITH PROPOFOL;  Surgeon: Scot Jun, MD;  Location: [facility] ENDOSCOPY;  Service: Endoscopy;;  ESOPHAGOGASTRODUODENOSCOPY (EGD) WITH PROPOFOL N/A 05/25/2022  Procedure: ESOPHAGOGASTRODUODENOSCOPY (EGD) WITH PROPOFOL;  Surgeon: [name], MD;  Location: [facility] ENDOSCOPY;  Service: Endoscopy;  Laterality: N/A;  IR BONE MARROW BIOPSY & ASPIRATION  01/03/2023  JOINT REPLACEMENT Left   knee  KNEE ARTHROSCOPY  1997  left knee  LEFT HEART CATH AND CORONARY ANGIOGRAPHY Left 11/10/2017  Procedure: LEFT HEART CATH AND CORONARY ANGIOGRAPHY;  Surgeon: Marcina Millard, MD;  Location: [facility] INVASIVE CV LAB;  Service: Cardiovascular;  Laterality: Left;  LEFT HEART CATHETERIZATION WITH CORONARY ANGIOGRAM N/A 01/11/2013  Procedure: LEFT HEART CATHETERIZATION WITH CORONARY ANGIOGRAM;  Surgeon: [name], MD;  Location: [facility] CATH LAB;  Service: Cardiovascular;  Laterality: N/A;  TOTAL KNEE ARTHROPLASTY  08/30/2011  Procedure: TOTAL KNEE ARTHROPLASTY;  Surgeon: Nilda Simmer, MD;  Location: [facility] OR;  Service: Orthopedics;  Laterality: Left; Rolena Infante, MS [facility] SLP Acute Rehab Services Office [phone] Chales Abrahams 03/09/2023, 12:04 PM    Pamella Pert, MD, PhD [facility]  Between 7 am - 7 pm I am available, please contact me via Amion (for emergencies) or Securechat (non urgent messages)  Between 7 pm - 7 am I am not available, please contact night coverage MD/APP via Amion

## 2023-03-09 NOTE — Progress Notes (Addendum)
Modified Barium Swallow Study  Patient Details  Name: Heather Haley MRN: [id number] Date of Birth: 1960/12/10  Today's Date: 03/09/2023  HPI/PMH: HPI: 63yo female admitted 03/06/23 after a fall at home. PMH: leukemia, anxiety, depression, GERD, schizoaffective disorder, schiophrenia, PTSD, insomnia, tobacco abuse, drug-induced Parkinson's dz, asthma, carpal tunner, CKD, chronic low back pain, pancytopenia, nephrolithiasis, HLT, HTN, IBS, neuromuscular d/o, DM with peripheral neuropathy, PONV. Steady decline in mental status in recent months per son. CXR - LLL consolidation. MRI = trace SDH posterior falx and left temporo-occipital region. MBS 03/2018 = no pen/asp, reg/thin. CXR = LLL PNA, right lung clear.   Clinical Impression: Clinical Impression: Patient present with minimal pharyngeal dysphagia resulting in trace aspiration of thin liquids - most notably with sequential swallows.  Chin tuck posture decr to trace penetration of thin with straw usage.  Pharyngeal swallow was largely strong without significant retention.     Barium tablet given with nectar thick easily transited through pharynx and into esophagus.  However it appeared to briefly halt at distal esophagus with nectar barium stasis above it.   Pt notable for referrent sensation to pharynx. SLP provided pudding barium to aid clearance but esophagus was clear upon esophageal sweep.   Suspect potential dysmotlity component, radiologist not present for evaluation and MBS does not diagnose below PES.  Educated pt to findings/recommendations using teach back following exam.  The esophagus was normal. Scattered moderate inflammation characterized by erythema and  granularity was found in the cardia and in the gastric body. On prior endoscopy. 2016.   Factors that may increase risk of adverse event in presence of aspiration Rubye Oaks & Clearance Coots 2021): Factors that may increase risk of adverse event in presence of aspiration Rubye Oaks & Clearance Coots  2021): Frail or deconditioned   Recommendations/Plan: Swallowing Evaluation Recommendations Swallowing Evaluation Recommendations Recommendations: PO diet PO Diet Recommendation: Regular; Thin liquids (Level 0) (for pt to order foods she can mange - advised her she MUST order her own meals) Liquid Administration via: Cup; Straw Medication Administration: Whole meds with liquid (with nectar/Ensure and applesauce) Supervision: Patient able to self-feed Swallowing strategies  : Slow rate; Small bites/sips Postural changes: Stay upright 30-60 min after meals; Position pt fully upright for meals Oral care recommendations: Oral care BID (2x/day)    Treatment Plan Treatment Plan Treatment recommendations: Therapy as outlined in treatment plan below Follow-up recommendations: Skilled nursing-short term rehab (<3 hours/day) Functional status assessment: Patient has had a recent decline in their functional status and demonstrates the ability to make significant improvements in function in a reasonable and predictable amount of time. Treatment frequency: Min 1x/week Treatment duration: 1 week Interventions: Aspiration precaution training; Patient/family education; Compensatory techniques     Recommendations Recommendations for follow up therapy are one component of a multi-disciplinary discharge planning process, led by the attending physician.  Recommendations may be updated based on patient status, additional functional criteria and insurance authorization.  Assessment: Orofacial Exam: Orofacial Exam Oral Cavity - Dentition: Missing dentition; Other (Comment) (few lower dentition - pt waiting to get upper dentures) Orofacial Anatomy: [facility] Oral Motor/Sensory Function: WFL    Anatomy:  Anatomy: WFL   Boluses Administered: Boluses Administered Boluses Administered: Thin liquids (Level 0); Mildly thick liquids (Level 2, nectar thick); Puree; Solid     Oral Impairment  Domain: Oral Impairment Domain Lip Closure: No labial escape Tongue control during bolus hold: Posterior escape of less than half of bolus Bolus preparation/mastication: Slow prolonged chewing/mashing with complete recollection Bolus transport/lingual motion: Brisk tongue  motion Oral residue: Trace residue lining oral structures Location of oral residue : Tongue Initiation of pharyngeal swallow : Valleculae; Pyriform sinuses     Pharyngeal Impairment Domain: Pharyngeal Impairment Domain Soft palate elevation: No bolus between soft palate (SP)/pharyngeal wall (PW) Laryngeal elevation: Partial superior movement of thyroid cartilage/partial approximation of arytenoids to epiglottic petiole Anterior hyoid excursion: Partial anterior movement Epiglottic movement: Complete inversion Laryngeal vestibule closure: Incomplete, narrow column air/contrast in laryngeal vestibule Pharyngeal stripping wave : Present - complete Pharyngeal contraction (A/P view only): N/A Pharyngoesophageal segment opening: Complete distension and complete duration, no obstruction of flow Tongue base retraction: Trace column of contrast or air between tongue base and PPW Pharyngeal residue: Trace residue within or on pharyngeal structures Location of pharyngeal residue: Valleculae; Pyriform sinuses; Aryepiglottic folds     Esophageal Impairment Domain: Esophageal Impairment Domain Esophageal clearance upright position: Esophageal retention (Barium tablet taken with nectar appeared to briefly halt at distal esophagus with nectar barium stasis above it - pt having referrent sensation to pharynx.  SLP provided pudding barium to aid clearance but esophagus was clear upon esophageal sweep)    Pill: Pill Consistency administered: Mildly thick liquids (Level 2, nectar thick) Mildly thick liquids (Level 2, nectar thick): WFL    Penetration/Aspiration Scale Score: Penetration/Aspiration Scale Score 1.  Material does  not enter airway: Mildly thick liquids (Level 2, nectar thick); Puree; Solid; Pill 3.  Material enters airway, remains ABOVE vocal cords and not ejected out: Thin liquids (Level 0) 8.  Material enters airway, passes BELOW cords without attempt by patient to eject out (silent aspiration) : Thin liquids (Level 0)    Compensatory Strategies: Compensatory Strategies Compensatory strategies: Yes Chin tuck: Ineffective; Effective Effective Chin Tuck: Thin liquid (Level 0) Ineffective Chin Tuck: Thin liquid (Level 0) Other(comment): Effective (single boluses with liquids effective)       General Information: Caregiver present: No   Diet Prior to this Study: Regular; Thin liquids (Level 0)    Temperature : Normal    Respiratory Status: WFL    Supplemental O2: Nasal cannula    History of Recent Intubation: No   Behavior/Cognition: Alert; Cooperative; Pleasant mood  Self-Feeding Abilities: Able to self-feed  Baseline vocal quality/speech: Normal  Volitional Cough: Able to elicit  Volitional Swallow: Unable to elicit  Exam Limitations: No limitations   Goal Planning: Prognosis for improved oropharyngeal function: Fair  Barriers to Reach Goals: Other (Comment); Overall medical prognosis (medical diagnosis)  No data recorded Patient/Family Stated Goal: none stated  Consulted and agree with results and recommendations: Patient; Nurse   Pain: Pain Assessment Pain Assessment: Faces Pain Score: 9 Faces Pain Scale: 4 Pain Location: L LE, back Pain Descriptors / Indicators: Constant Pain Intervention(s): Limited activity within patient's tolerance; Monitored during session; Repositioned    End of Session: Start Time:SLP Start Time (ACUTE ONLY): 1107  Stop Time: SLP Stop Time (ACUTE ONLY): 1145  Time Calculation:SLP Time Calculation (min) (ACUTE ONLY): 38 min  Charges: SLP Evaluations $ SLP Speech Visit: 1 Visit  SLP Evaluations $BSS Swallow: 1 Procedure $MBS  Swallow: 1 Procedure $Swallowing Treatment: 1 Procedure   SLP visit diagnosis: SLP Visit Diagnosis: Dysphagia, pharyngeal phase (R13.13); Dysphagia, unspecified (R13.10)    Past Medical History:  Past Medical History:  Diagnosis Date   Anxiety    Asthma    Chronic kidney disease    Chronic pain    previously saw Dr. Shireen Quan in pain clinic, then saw pain specialist in [facility]   Depression  Diabetes mellitus (HCC)    Frequency of urination    GERD (gastroesophageal reflux disease)    Headache(784.0)    High cholesterol    History of kidney stones    Hypertension    IBS (irritable bowel syndrome)    Left ankle instability    Left knee DJD    Lumbar Degenerative Disc Disease of  10/11/2014   Neuromuscular disorder (HCC)    Osteoarthritis of hip (Right) 05/05/2015   Other enthesopathy of ankle and tarsus 12/15/2009   Qualifier: Diagnosis of  By: Darrick Penna MD, KARL     Parkinson's disease (HCC)    Peripheral sensory neuropathy (Bilateral) 11/19/2014   Postoperative nausea and vomiting    Schizophrenia (HCC)    Past Surgical History:  Past Surgical History:  Procedure Laterality Date   ABDOMINAL HYSTERECTOMY     ANKLE SURGERY Left    x 2   APPENDECTOMY     CARDIAC CATHETERIZATION     COLONOSCOPY  2013   COLONOSCOPY WITH PROPOFOL N/A 04/25/2017   Procedure: COLONOSCOPY WITH PROPOFOL;  Surgeon: Scot Jun, MD;  Location: [facility] ENDOSCOPY;  Service: Endoscopy;  Laterality: N/A;   COLONOSCOPY WITH PROPOFOL N/A 05/25/2022   Procedure: COLONOSCOPY WITH PROPOFOL;  Surgeon: [name], MD;  Location: [facility] ENDOSCOPY;  Service: Endoscopy;  Laterality: N/A;   CYSTOSCOPY/URETEROSCOPY/HOLMIUM LASER/STENT PLACEMENT Left 09/11/2019   Procedure: CYSTOSCOPY/URETEROSCOPY/HOLMIUM LASER/STENT PLACEMENT;  Surgeon: Riki Altes, MD;  Location: [facility] ORS;  Service: Urology;  Laterality: Left;   ESOPHAGOGASTRODUODENOSCOPY (EGD) WITH PROPOFOL N/A 10/03/2014   Procedure:  ESOPHAGOGASTRODUODENOSCOPY (EGD) WITH PROPOFOL;  Surgeon: Elnita Maxwell, MD;  Location: [facility] ENDOSCOPY;  Service: Endoscopy;  Laterality: N/A;   ESOPHAGOGASTRODUODENOSCOPY (EGD) WITH PROPOFOL  04/25/2017   Procedure: ESOPHAGOGASTRODUODENOSCOPY (EGD) WITH PROPOFOL;  Surgeon: Scot Jun, MD;  Location: [facility] ENDOSCOPY;  Service: Endoscopy;;   ESOPHAGOGASTRODUODENOSCOPY (EGD) WITH PROPOFOL N/A 05/25/2022   Procedure: ESOPHAGOGASTRODUODENOSCOPY (EGD) WITH PROPOFOL;  Surgeon: [name], MD;  Location: [facility] ENDOSCOPY;  Service: Endoscopy;  Laterality: N/A;   IR BONE MARROW BIOPSY & ASPIRATION  01/03/2023   JOINT REPLACEMENT Left    knee   KNEE ARTHROSCOPY  1997   left knee   LEFT HEART CATH AND CORONARY ANGIOGRAPHY Left 11/10/2017   Procedure: LEFT HEART CATH AND CORONARY ANGIOGRAPHY;  Surgeon: Marcina Millard, MD;  Location: [facility] INVASIVE CV LAB;  Service: Cardiovascular;  Laterality: Left;   LEFT HEART CATHETERIZATION WITH CORONARY ANGIOGRAM N/A 01/11/2013   Procedure: LEFT HEART CATHETERIZATION WITH CORONARY ANGIOGRAM;  Surgeon: [name], MD;  Location: [facility] CATH LAB;  Service: Cardiovascular;  Laterality: N/A;   TOTAL KNEE ARTHROPLASTY  08/30/2011   Procedure: TOTAL KNEE ARTHROPLASTY;  Surgeon: Nilda Simmer, MD;  Location: [facility] OR;  Service: Orthopedics;  Laterality: Left;   Rolena Infante, MS [facility] SLP Acute Rehab Services Office [phone]  Chales Abrahams 03/09/2023, 12:01 PM

## 2023-03-09 NOTE — Progress Notes (Signed)
Arrived at room to do EEG.   Patient could not tolerate having her head touched.  Refused having her head touched due to hematoma on head.  RN notified.

## 2023-03-09 NOTE — Discharge Summary (Signed)
Physician Discharge Summary  Heather Haley QMV:[id number] DOB: 1960-11-06 DOA: 03/06/2023  PCP: Patient, No Pcp Per  Admit date: 03/06/2023 Discharge date: 03/10/2023  Admitted From: home Disposition:  [facility] transfer  Discharge Condition: stable CODE STATUS: Full code Diet Orders (From admission, onward)     Start     Ordered   03/06/23 1428  Diet heart healthy/carb modified Room service appropriate? Yes; Fluid consistency: Thin  Diet effective now       Question Answer Comment  Diet-HS Snack? Nothing   Room service appropriate? Yes   Fluid consistency: Thin      03/06/23 1428            HPI: Per admitting MD, KENYON ESHLEMAN is a 63 y.o. female with medical history significant of anxiety, depression, schizoaffective disorder, schizophrenia, PTSD, insomnia, tobacco abuse, drug-induced Parkinson's disease, asthma, carpal tunnel syndrome CKD, chronic lower back pain, pancytopenia, nephrolithiasis, hyperlipidemia, hypertension, IBS, left ankle instability, left knee DJD, neuromuscular disorder, diabetic peripheral neuropathy, postoperative nausea and vomiting Who presented to the emergency department with a history of waking up, having a fall with LOC and waking up with a headache and a head hematoma.  Her mental status has had some slow, but steady decline in recent months while getting chemotherapy according to her son, but she is still not fully back to her recent mental state.  She knows she is in [place] at a [facility], knew the month, but stated it was 2024.  She was not fully aware of the situation.  She is able to answer simple questions.  She still has a headache, but no chest, back or abdominal pain at the moment.   Hospital Course / Discharge diagnoses: Principal Problem:   Aspiration pneumonia (HCC) Active Problems:   Hypertension   Chronic constipation   Diabetic sensory peripheral neuropathy (Bilateral Lower Extremity)   Generalized anxiety  disorder   History of panic attacks   GERD (gastroesophageal reflux disease)   Non-insulin dependent type 2 diabetes mellitus (HCC)   Pure hypercholesterolemia   MDD (major depressive disorder), recurrent, severe, with psychosis (HCC)   Schizoaffective disorder, depressive type (HCC)   PTSD (post-traumatic stress disorder)   Drug-induced Parkinson's disease (HCC)   Febrile neutropenia (HCC)   OSA (obstructive sleep apnea)   Pancytopenia (HCC)   Class 2 obesity   Physical deconditioning   Hematoma of occipital region of scalp   Acute myeloid leukemia not having achieved remission (HCC)   Community acquired pneumonia   Principal problem Febrile neutropenia, sepsis due to CAP, acute hypoxic respiratory failure -chest x-ray with left lower lobe consolidation, CT scan on 1/28 with multilobar pneumonia, most pronounced in the left lower lobe.  She was started on broad-spectrum antibiotics with Zosyn, continue.  She was on 8L on admission, currently on room air   Active problems AML, pancytopenia -supposed to be on venetoclax. She is not taking venetoclax, per oncology note daughter request to take it back home as she does not want her mother to receive any more for this cycle. Continue supportive care and transfuse for hemoglobin less than 7 as well as platelets less than 50.  Improving counts. Case discussed with [facility], she has been accepted for transfer.  Accepting MD is Dr. Melrose Nakayama  Acute Metabolic Encephalopathy, Myoclonic Jerks, Hyperreflexia - Suspect this is related to her underlying infection above. Tremors, myoclonic jerks seems improved. Head CT with large posterior scalp hematoma with no underlying skull fracture, small parafalcine subdural hematoma.  Ammonia, TSH, B12 normal. MRI brain with trace subdural hematoma along posterior falx and L temporal occipital region, R parietal scalp hematoma, possible chronic microvascular ischemic changes (see report).  She could not tolerate EEG but  doubt seizures given that she is alert and oriented x 4 Fall  Subdural Hematoma  Posterior Scalp Hematoma - Per neurosurgery, no need for intervention or follow up  Urinary Retention - Foley in place  Chronic Pain - Continue butrans patch Drug Induced Parkinsonism - Continue sinemet PTSD, Schizoaffective Disorder, Mood Disorder - Zyprexa, amitriptyline OSA - CPAP RLS - Ropinirole Obesity - Body mass index is 39.91 kg/m. T2DM -continue home medications  Sepsis ruled out   Discharge Instructions   Allergies as of 03/10/2023       Reactions   Codeine Hives   Cymbalta [duloxetine Hcl] Other (See Comments)   Altered mental status, Alopecia, visual hallucinations, nightmares   Gabapentin Swelling   Nsaids Anaphylaxis   REACTION: palpitations, diaphoresis Reaction: Diaphoresis / Sweating (intolerance); AND CHEST PAINS  Other Reaction(s): Diaphoresis / Sweating (intolerance)    Reaction: Diaphoresis / Sweating (intolerance); AND CHEST PAINS     AND CHEST PAINS    "feel like I'm having a heart attack"   Prolixin Decanoate [fluphenazine]    Ineffective    Benztropine Mesylate Other (See Comments)   Buprenorphine Hcl Other (See Comments)   Unable to void   Morphine And Codeine Other (See Comments)   Unable to void   Other Other (See Comments)   "Anesthesia makes me sick"   Risperidone And Related Other (See Comments)   tremors   Wellbutrin [bupropion]    Constipation, mood swings   Benztropine Other (See Comments)   Hair fall out  Suicide thoughts   Carbidopa-levodopa Nausea Only   Lyrica [pregabalin] Other (See Comments)   Alopecia, visual hallucinations, nightmares Altered mental status   Nortripytline Hcl [nortriptyline] Other (See Comments)   Hair loss and night mares   Penicillins Nausea And Vomiting, Other (See Comments)   REACTION: upset stomach Has patient had a PCN reaction causing immediate rash, facial/tongue/throat swelling, SOB or lightheadedness with  hypotension: No Has patient had a PCN reaction causing severe rash involving mucus membranes or skin necrosis: No Has patient had a PCN reaction that required hospitalization: No Has patient had a PCN reaction occurring within the last 10 years: No If all of the above answers are "NO", then may proceed with Cephalosporin use.   Tolmetin Other (See Comments), Palpitations   REACTION: palpitations, diaphoresis        Medication List     TAKE these medications    Accu-Chek Guide test strip Generic drug: glucose blood   acetaminophen 500 MG tablet Commonly known as: TYLENOL Take 1,000 mg by mouth every 6 (six) hours as needed for moderate pain (pain score 4-6) or headache.   allopurinol 300 MG tablet Commonly known as: ZYLOPRIM Take 300 mg by mouth daily.   amitriptyline 50 MG tablet Commonly known as: ELAVIL TAKE 1 AND 1/2 TABLETS AT BEDTIME   amLODipine 5 MG tablet Commonly known as: NORVASC Take 5 mg by mouth daily.   aspirin EC 81 MG tablet Take 81 mg by mouth daily as needed for moderate pain (pain score 4-6). Swallow whole.   B-D ULTRAFINE III SHORT PEN 31G X 8 MM Misc Generic drug: Insulin Pen Needle Inject into the skin.   buprenorphine 20 MCG/HR Ptwk Commonly known as: BUTRANS Place 1 patch onto the skin once a week.  carbidopa-levodopa 25-100 MG tablet Commonly known as: SINEMET IR Take 1 tablet by mouth 3 (three) times daily. What changed: Another medication with the same name was removed. Continue taking this medication, and follow the directions you see here.   Fiasp FlexTouch 100 UNIT/ML FlexTouch Pen Generic drug: insulin aspart Inject 10 Units into the skin daily in the afternoon. Takes 10 units at 10:00 am, 16 units at 4 pm, 10 units at 10 pm.   Fish Oil 1000 MG Caps Take 1 capsule by mouth daily.   hydrOXYzine 50 MG tablet Commonly known as: ATARAX Take 0.5-1 tablets (25-50 mg total) by mouth 2 (two) times daily as needed. What changed:   how much to take when to take this   isosorbide mononitrate 30 MG 24 hr tablet Commonly known as: IMDUR Take 30 mg by mouth daily.   levothyroxine 75 MCG tablet Commonly known as: SYNTHROID Take 75 mcg by mouth See admin instructions. Monday-Friday. Takes 50 MCG tab on Saturdays and Sundays   levothyroxine 50 MCG tablet Commonly known as: SYNTHROID Take 50 mcg by mouth See admin instructions. Saturdays and Sundays only. Takes 75 MCG tab Monday-Friday   magnesium oxide 400 (240 Mg) MG tablet Commonly known as: MAG-OX Take 800 mg by mouth daily.   metFORMIN 500 MG tablet Commonly known as: GLUCOPHAGE Take 1,000 mg by mouth 2 (two) times daily with a meal.   metoprolol tartrate 25 MG tablet Commonly known as: LOPRESSOR Take 25 mg by mouth 2 (two) times daily.   nitroGLYCERIN 0.4 mg/hr patch Commonly known as: NITRODUR - Dosed in mg/24 hr Place 0.4 mg onto the skin as needed.   OLANZapine 5 MG tablet Commonly known as: ZYPREXA Take 0.5 tablets (2.5 mg total) by mouth at bedtime.   ondansetron 8 MG tablet Commonly known as: ZOFRAN Take 8 mg by mouth in the morning, at noon, and at bedtime.   oxyCODONE 5 MG immediate release tablet Commonly known as: Oxy IR/ROXICODONE Take 5-10 mg by mouth 2 (two) times daily as needed for moderate pain (pain score 4-6).   pravastatin 40 MG tablet Commonly known as: PRAVACHOL Take 40 mg by mouth daily.   rOPINIRole 0.5 MG tablet Commonly known as: REQUIP Take 0.5 mg by mouth 3 (three) times daily.   Semaglutide 3 MG Tabs Take 3 mg by mouth daily.   SUMAtriptan 100 MG tablet Commonly known as: IMITREX Take 100 mg by mouth every 2 (two) hours as needed for migraine. May repeat in 2 hours if headache persists or recurs.   torsemide 20 MG tablet Commonly known as: DEMADEX Take 20 mg by mouth every other day.   Evaristo Bury FlexTouch 100 UNIT/ML FlexTouch Pen Generic drug: insulin degludec Inject 38 Units into the skin daily. At  10:00 pm   valACYclovir 500 MG tablet Commonly known as: VALTREX Take 500 mg by mouth daily.   venetoclax 100 MG tablet Commonly known as: VENCLEXTA Take 400 mg by mouth daily. Tablets should be swallowed whole with a meal and a full glass of water.   Vitamin D-3 125 MCG (5000 UT) Tabs Take 1,000 Units by mouth daily.        Consultations: Oncologoy   Procedures/Studies:  DG Swallowing Func-Speech Pathology Result Date: 03/09/2023 Table formatting from the original result was not included. Modified Barium Swallow Study Patient Details Name: LEILYNN PILAT MRN: [id number] Date of Birth: 11/27/1960 Today's Date: 03/09/2023 HPI/PMH: HPI: 63yo female admitted 03/06/23 after a fall at home. PMH: leukemia, anxiety,  depression, GERD, schizoaffective disorder, schiophrenia, PTSD, insomnia, tobacco abuse, drug-induced Parkinson's dz, asthma, carpal tunner, CKD, chronic low back pain, pancytopenia, nephrolithiasis, HLT, HTN, IBS, neuromuscular d/o, DM with peripheral neuropathy, PONV. Steady decline in mental status in recent months per son. CXR - LLL consolidation. MRI = trace SDH posterior falx and left temporo-occipital region. MBS 03/2018 = no pen/asp, reg/thin. CXR = LLL PNA, right lung clear. Clinical Impression: Clinical Impression: Patient present with minimal pharyngeal dysphagia resulting in trace aspiration of thin liquids - most notably with sequential swallows.  Chin tuck posture decr to trace penetration of thin with straw usage.  Pharyngeal swallow was largely strong without significant retention.     Barium tablet given with nectar thick easily transited through pharynx and into esophagus.  However it appeared to briefly halt at distal esophagus with nectar barium stasis above it.   Pt notable for referrent sensation to pharynx. SLP provided pudding barium to aid clearance but esophagus was clear upon esophageal sweep.   Suspect potential dysmotlity component, radiologist not present for  evaluation and MBS does not diagnose below PES.  Educated pt to findings/recommendations using teach back following exam. The esophagus was normal. Scattered moderate inflammation characterized by erythema and  granularity was found in the cardia and in the gastric body. On prior endoscopy. 2016. Factors that may increase risk of adverse event in presence of aspiration Rubye Oaks & Clearance Coots 2021): Factors that may increase risk of adverse event in presence of aspiration Rubye Oaks & Clearance Coots 2021): Frail or deconditioned Recommendations/Plan: Swallowing Evaluation Recommendations Swallowing Evaluation Recommendations Recommendations: PO diet PO Diet Recommendation: Regular; Thin liquids (Level 0) (for pt to order foods she can mange - advised her she MUST order her own meals) Liquid Administration via: Cup; Straw Medication Administration: Whole meds with liquid (with nectar/Ensure and applesauce) Supervision: Patient able to self-feed Swallowing strategies  : Slow rate; Small bites/sips Postural changes: Stay upright 30-60 min after meals; Position pt fully upright for meals Oral care recommendations: Oral care BID (2x/day) Treatment Plan Treatment Plan Treatment recommendations: Therapy as outlined in treatment plan below Follow-up recommendations: Skilled nursing-short term rehab (<3 hours/day) Functional status assessment: Patient has had a recent decline in their functional status and demonstrates the ability to make significant improvements in function in a reasonable and predictable amount of time. Treatment frequency: Min 1x/week Treatment duration: 1 week Interventions: Aspiration precaution training; Patient/family education; Compensatory techniques Recommendations Recommendations for follow up therapy are one component of a multi-disciplinary discharge planning process, led by the attending physician.  Recommendations may be updated based on patient status, additional functional criteria and insurance  authorization. Assessment: Orofacial Exam: Orofacial Exam Oral Cavity - Dentition: Missing dentition; Other (Comment) (few lower dentition - pt waiting to get upper dentures) Orofacial Anatomy: [facility] Oral Motor/Sensory Function: WFL Anatomy: Anatomy: WFL Boluses Administered: Boluses Administered Boluses Administered: Thin liquids (Level 0); Mildly thick liquids (Level 2, nectar thick); Puree; Solid  Oral Impairment Domain: Oral Impairment Domain Lip Closure: No labial escape Tongue control during bolus hold: Posterior escape of less than half of bolus Bolus preparation/mastication: Slow prolonged chewing/mashing with complete recollection Bolus transport/lingual motion: Brisk tongue motion Oral residue: Trace residue lining oral structures Location of oral residue : Tongue Initiation of pharyngeal swallow : Valleculae; Pyriform sinuses  Pharyngeal Impairment Domain: Pharyngeal Impairment Domain Soft palate elevation: No bolus between soft palate (SP)/pharyngeal wall (PW) Laryngeal elevation: Partial superior movement of thyroid cartilage/partial approximation of arytenoids to epiglottic petiole Anterior hyoid excursion: Partial anterior movement Epiglottic  movement: Complete inversion Laryngeal vestibule closure: Incomplete, narrow column air/contrast in laryngeal vestibule Pharyngeal stripping wave : Present - complete Pharyngeal contraction (A/P view only): N/A Pharyngoesophageal segment opening: Complete distension and complete duration, no obstruction of flow Tongue base retraction: Trace column of contrast or air between tongue base and PPW Pharyngeal residue: Trace residue within or on pharyngeal structures Location of pharyngeal residue: Valleculae; Pyriform sinuses; Aryepiglottic folds  Esophageal Impairment Domain: Esophageal Impairment Domain Esophageal clearance upright position: Esophageal retention (Barium tablet taken with nectar appeared to briefly halt at distal esophagus with nectar barium stasis  above it - pt having referrent sensation to pharynx.  SLP provided pudding barium to aid clearance but esophagus was clear upon esophageal sweep) Pill: Pill Consistency administered: Mildly thick liquids (Level 2, nectar thick) Mildly thick liquids (Level 2, nectar thick): WFL Penetration/Aspiration Scale Score: Penetration/Aspiration Scale Score 1.  Material does not enter airway: Mildly thick liquids (Level 2, nectar thick); Puree; Solid; Pill 3.  Material enters airway, remains ABOVE vocal cords and not ejected out: Thin liquids (Level 0) 8.  Material enters airway, passes BELOW cords without attempt by patient to eject out (silent aspiration) : Thin liquids (Level 0) Compensatory Strategies: Compensatory Strategies Compensatory strategies: Yes Chin tuck: Ineffective; Effective Effective Chin Tuck: Thin liquid (Level 0) Ineffective Chin Tuck: Thin liquid (Level 0) Other(comment): Effective (single boluses with liquids effective)   General Information: Caregiver present: No  Diet Prior to this Study: Regular; Thin liquids (Level 0)   Temperature : Normal   Respiratory Status: WFL   Supplemental O2: Nasal cannula   History of Recent Intubation: No  Behavior/Cognition: Alert; Cooperative; Pleasant mood Self-Feeding Abilities: Able to self-feed Baseline vocal quality/speech: Normal Volitional Cough: Able to elicit Volitional Swallow: Unable to elicit Exam Limitations: No limitations Goal Planning: Prognosis for improved oropharyngeal function: Fair Barriers to Reach Goals: Other (Comment); Overall medical prognosis (medical diagnosis) No data recorded Patient/Family Stated Goal: none stated Consulted and agree with results and recommendations: Patient; Nurse Pain: Pain Assessment Pain Assessment: Faces Pain Score: 9 Faces Pain Scale: 4 Pain Location: L LE, back Pain Descriptors / Indicators: Constant Pain Intervention(s): Limited activity within patient's tolerance; Monitored during session; Repositioned End of  Session: Start Time:SLP Start Time (ACUTE ONLY): 1107 Stop Time: SLP Stop Time (ACUTE ONLY): 1145 Time Calculation:SLP Time Calculation (min) (ACUTE ONLY): 38 min Charges: SLP Evaluations $ SLP Speech Visit: 1 Visit SLP Evaluations $BSS Swallow: 1 Procedure $MBS Swallow: 1 Procedure $Swallowing Treatment: 1 Procedure SLP visit diagnosis: SLP Visit Diagnosis: Dysphagia, pharyngeal phase (R13.13); Dysphagia, unspecified (R13.10) Past Medical History: Past Medical History: Diagnosis Date  Anxiety   Asthma   Chronic kidney disease   Chronic pain   previously saw Dr. Shireen Quan in pain clinic, then saw pain specialist in [facility]  Depression   Diabetes mellitus (HCC)   Frequency of urination   GERD (gastroesophageal reflux disease)   Headache(784.0)   High cholesterol   History of kidney stones   Hypertension   IBS (irritable bowel syndrome)   Left ankle instability   Left knee DJD   Lumbar Degenerative Disc Disease of  10/11/2014  Neuromuscular disorder (HCC)   Osteoarthritis of hip (Right) 05/05/2015  Other enthesopathy of ankle and tarsus 12/15/2009  Qualifier: Diagnosis of  By: Darrick Penna MD, KARL    Parkinson's disease (HCC)   Peripheral sensory neuropathy (Bilateral) 11/19/2014  Postoperative nausea and vomiting   Schizophrenia (HCC)  Past Surgical History: Past Surgical History: Procedure Laterality Date  ABDOMINAL HYSTERECTOMY  ANKLE SURGERY Left   x 2  APPENDECTOMY    CARDIAC CATHETERIZATION    COLONOSCOPY  2013  COLONOSCOPY WITH PROPOFOL N/A 04/25/2017  Procedure: COLONOSCOPY WITH PROPOFOL;  Surgeon: Scot Jun, MD;  Location: [facility] ENDOSCOPY;  Service: Endoscopy;  Laterality: N/A;  COLONOSCOPY WITH PROPOFOL N/A 05/25/2022  Procedure: COLONOSCOPY WITH PROPOFOL;  Surgeon: [name], MD;  Location: [facility] ENDOSCOPY;  Service: Endoscopy;  Laterality: N/A;  CYSTOSCOPY/URETEROSCOPY/HOLMIUM LASER/STENT PLACEMENT Left 09/11/2019  Procedure: CYSTOSCOPY/URETEROSCOPY/HOLMIUM LASER/STENT PLACEMENT;  Surgeon:  Riki Altes, MD;  Location: [facility] ORS;  Service: Urology;  Laterality: Left;  ESOPHAGOGASTRODUODENOSCOPY (EGD) WITH PROPOFOL N/A 10/03/2014  Procedure: ESOPHAGOGASTRODUODENOSCOPY (EGD) WITH PROPOFOL;  Surgeon: Elnita Maxwell, MD;  Location: [facility] ENDOSCOPY;  Service: Endoscopy;  Laterality: N/A;  ESOPHAGOGASTRODUODENOSCOPY (EGD) WITH PROPOFOL  04/25/2017  Procedure: ESOPHAGOGASTRODUODENOSCOPY (EGD) WITH PROPOFOL;  Surgeon: Scot Jun, MD;  Location: [facility] ENDOSCOPY;  Service: Endoscopy;;  ESOPHAGOGASTRODUODENOSCOPY (EGD) WITH PROPOFOL N/A 05/25/2022  Procedure: ESOPHAGOGASTRODUODENOSCOPY (EGD) WITH PROPOFOL;  Surgeon: [name], MD;  Location: [facility] ENDOSCOPY;  Service: Endoscopy;  Laterality: N/A;  IR BONE MARROW BIOPSY & ASPIRATION  01/03/2023  JOINT REPLACEMENT Left   knee  KNEE ARTHROSCOPY  1997  left knee  LEFT HEART CATH AND CORONARY ANGIOGRAPHY Left 11/10/2017  Procedure: LEFT HEART CATH AND CORONARY ANGIOGRAPHY;  Surgeon: Marcina Millard, MD;  Location: [facility] INVASIVE CV LAB;  Service: Cardiovascular;  Laterality: Left;  LEFT HEART CATHETERIZATION WITH CORONARY ANGIOGRAM N/A 01/11/2013  Procedure: LEFT HEART CATHETERIZATION WITH CORONARY ANGIOGRAM;  Surgeon: [name], MD;  Location: [facility] CATH LAB;  Service: Cardiovascular;  Laterality: N/A;  TOTAL KNEE ARTHROPLASTY  08/30/2011  Procedure: TOTAL KNEE ARTHROPLASTY;  Surgeon: Nilda Simmer, MD;  Location: [facility] OR;  Service: Orthopedics;  Laterality: Left; Rolena Infante, MS [facility] SLP Acute Rehab Services Office [phone] Chales Abrahams 03/09/2023, 12:04 PM  CT CHEST ABDOMEN PELVIS W CONTRAST Result Date: 03/08/2023 CLINICAL DATA:  Neutropenia and fever. EXAM: CT CHEST, ABDOMEN, AND PELVIS WITH CONTRAST TECHNIQUE: Multidetector CT imaging of the chest, abdomen and pelvis was performed following the standard protocol during bolus administration of intravenous contrast. RADIATION DOSE REDUCTION: This exam was performed according  to the departmental dose-optimization program which includes automated exposure control, adjustment of the mA and/or kV according to patient size and/or use of iterative reconstruction technique. CONTRAST:  OMNIPAQUE IOHEXOL 300 MG/ML  SOLN COMPARISON:  CT of the abdomen pelvis dated 01/23/2023. FINDINGS: CT CHEST FINDINGS Cardiovascular: There is no cardiomegaly or pericardial effusion. There is coronary vascular calcification. Mild atherosclerotic calcification of the thoracic aorta. No aneurysmal dilatation or dissection. The origins of the great vessels of the aortic arch and the central pulmonary arteries appear patent. Mediastinum/Nodes: No obvious hilar or mediastinal adenopathy. Evaluation however is limited due to consolidative changes of the lungs. The esophagus is grossly unremarkable. No mediastinal fluid collection. Lungs/Pleura: Small left pleural effusion. There is complete consolidative changes of the left lower lobe and partial consolidative changes of the lingula with air bronchogram. There is a subsegmental area of consolidation in the right lower lobe. There is no pneumothorax. The central airways are patent. Musculoskeletal: No acute osseous pathology. CT ABDOMEN PELVIS FINDINGS No intra-abdominal free air or free fluid. Hepatobiliary: Fatty liver. No biliary dilatation. Probable layering sludge in the gallbladder. No calcified gallstone or pericholecystic fluid. Pancreas: Unremarkable. No pancreatic ductal dilatation or surrounding inflammatory changes. Spleen: Normal in size without focal abnormality. Adrenals/Urinary Tract: The adrenal glands are unremarkable. There is  no hydronephrosis on either side. The visualized ureters appear unremarkable. The urinary bladder is decompressed around a Foley catheter. Stomach/Bowel: There is moderate stool throughout the colon. There is no bowel obstruction or active inflammation. Appendectomy. Vascular/Lymphatic: Mild atherosclerotic calcification  of the abdominal aorta. The IVC is unremarkable. No portal venous gas. There is no adenopathy. Reproductive: Hysterectomy. No suspicious adnexal masses. Other: None Musculoskeletal: No acute or significant osseous findings. IMPRESSION: 1. Multilobar pneumonia, most pronounced in the left lower lobe. 2. Small left pleural effusion. 3. No acute intra-abdominal or pelvic pathology. 4. Fatty liver. 5.  Aortic Atherosclerosis (ICD10-I70.0). Electronically Signed   By: Elgie Collard M.D.   On: 03/08/2023 13:00   MR BRAIN WO CONTRAST Result Date: 03/08/2023 CLINICAL DATA:  Neuro deficit, acute, stroke suspected. EXAM: MRI HEAD WITHOUT CONTRAST TECHNIQUE: Multiplanar, multiecho pulse sequences of the brain and surrounding structures were obtained without intravenous contrast. COMPARISON:  Head CT March 06, 2023; MRI brain September 06, 2020. FINDINGS: Brain: Trace subdural hematoma is seen along the posterior falx and left temporal occipital region. No mass effect. No acute infarction, hemorrhage, hydrocephalus or mass lesion. Few scattered foci of T2 hyperintensity are seen within the white matter of the cerebral hemispheres, nonspecific. Vascular: Normal flow voids. Skull and upper cervical spine: Normal marrow signal. Sinuses/Orbits: Negative. Other: Right parietal scalp hematoma measuring approximately 3.5 x 1.1 cm. IMPRESSION: 1. Trace subdural hematoma along the posterior falx and left temporal occipital region. No mass effect. 2. Right parietal scalp hematoma. 3. Few scattered foci of T2 hyperintensity within the white matter of the cerebral hemispheres, nonspecific but may represent early chronic microvascular ischemic changes. Electronically Signed   By: Baldemar Lenis M.D.   On: 03/08/2023 10:32   DG CHEST PORT 1 VIEW Result Date: 03/07/2023 CLINICAL DATA:  Shortness of breath.  Aspiration pneumonia. EXAM: PORTABLE CHEST 1 VIEW COMPARISON:  03/06/2023 FINDINGS: Left lower lobe retrocardiac  airspace opacity compatible with pneumonia, with reduced aeration and reduced definition of the left hemidiaphragm compared to 03/06/2023. Low lung volumes are present, causing crowding of the pulmonary vasculature. Mild enlargement of the cardiopericardial silhouette, without edema. Right lung appears grossly clear. IMPRESSION: 1. Left lower lobe pneumonia with reduced aeration compared to previous. 2. Low lung volumes. 3. Mild enlargement of the cardiopericardial silhouette, without edema. Electronically Signed   By: Gaylyn Rong M.D.   On: 03/07/2023 12:37   CT Head Wo Contrast Addendum Date: 03/06/2023 ADDENDUM REPORT: 03/06/2023 13:12 ADDENDUM: Study discussed by telephone with PA KAYLA KEITH on 03/06/2023 at 1251 hours. Electronically Signed   By: Odessa Fleming M.D.   On: 03/06/2023 13:12   Result Date: 03/06/2023 CLINICAL DATA:  63 year old female status post fall, found down. Posterior head hematoma. EXAM: CT HEAD WITHOUT CONTRAST TECHNIQUE: Contiguous axial images were obtained from the base of the skull through the vertex without intravenous contrast. RADIATION DOSE REDUCTION: This exam was performed according to the departmental dose-optimization program which includes automated exposure control, adjustment of the mA and/or kV according to patient size and/or use of iterative reconstruction technique. COMPARISON:  Brain MRI 09/06/2020.  Head CT 06/09/2016. FINDINGS: Brain: Small volume posterior para falcine subdural blood on series 6, image 61 measures up to 3 mm. No other acute intracranial hemorrhage identified. No IVH or ventriculomegaly. No midline shift, mass effect, or evidence of intracranial mass lesion. No cortically based acute infarct identified. Stable gray-white matter differentiation throughout the brain. Vascular: No suspicious intracranial vascular hyperdensity. Skull: No fracture identified.  Sinuses/Orbits: Visualized paranasal sinuses and mastoids are stable and well aerated. Other:  Broad-based right posterior and superior scalp hematoma, up to 2 cm in thickness. Underlying calvarium appears stable and intact. Visualized orbit soft tissues are within normal limits. Traumatic Brain Injury Risk Stratification Skull Fracture: No - Low/mBIG 1 Subdural Hematoma (SDH): <98mm - mBIG 1 Subarachnoid Hemorrhage [facility]): No Epidural Hematoma (EDH): No - Low/mBIG 1 Cerebral contusion, intra-axial, intraparenchymal Hemorrhage (IPH): No Intraventricular Hemorrhage (IVH): No - Low/mBIG 1 Midline Shift > 1mm or Edema/effacement of sulci/vents: No - Low/mBIG 1 ---------------------------------------------------- IMPRESSION: 1. Large posterior scalp hematoma with no underlying skull fracture, but Positive for a small para-falcine Subdural Hematoma, up to 3 mm. 2. No intracranial mass effect. No other acute intracranial abnormality. Electronically Signed: By: Odessa Fleming M.D. On: 03/06/2023 12:47   DG Chest 2 View Result Date: 03/06/2023 CLINICAL DATA:  63 year old female status post fall, found down. Posterior head hematoma. EXAM: CHEST - 2 VIEW COMPARISON:  CT Cervical spine today, Chest radiographs 01/23/2023. FINDINGS: Semi upright AP and lateral views at 1223 hours. Confluent airspace opacity, evidence of consolidation in the left lower lobe on both views. Small pleural effusion difficult to exclude. Similar lung volumes to last month. Mediastinal contours are stable and within normal limits. Visualized tracheal air column is within normal limits. The right lung appears to remain clear. No acute osseous abnormality identified. Negative visible bowel gas. Abdomen Calcified aortic atherosclerosis. IMPRESSION: Left lower lobe consolidation, new from last month. Consider Aspiration and/or Pneumonia in this setting. Small left pleural effusion is possible. Electronically Signed   By: Odessa Fleming M.D.   On: 03/06/2023 12:54   CT Cervical Spine Wo Contrast Result Date: 03/06/2023 CLINICAL DATA:  63 year old female  status post fall, found down. Posterior head hematoma. EXAM: CT CERVICAL SPINE WITHOUT CONTRAST TECHNIQUE: Multidetector CT imaging of the cervical spine was performed without intravenous contrast. Multiplanar CT image reconstructions were also generated. RADIATION DOSE REDUCTION: This exam was performed according to the departmental dose-optimization program which includes automated exposure control, adjustment of the mA and/or kV according to patient size and/or use of iterative reconstruction technique. COMPARISON:  Head CT today reported separately.  Neck CT 03/27/2020. FINDINGS: Alignment: Improved cervical lordosis compared to 2022. Cervicothoracic junction alignment is within normal limits. Bilateral posterior element alignment is within normal limits. Skull base and vertebrae: Mild motion artifact. Bone mineralization is within normal limits. Visualized skull base is intact. No atlanto-occipital dissociation. C1 and C2 appear intact and aligned. No acute osseous abnormality identified. Soft tissues and spinal canal: No prevertebral fluid or swelling. No visible canal hematoma. Negative visible noncontrast neck soft tissues. Disc levels: Chronic C5-C6 and C6-C7 disc and endplate degeneration does not appear significantly changed from 2022. Upper chest: Mild motion artifact, otherwise negative. IMPRESSION: 1. No acute traumatic injury identified in the cervical spine. 2. Chronic C5-C6 and C6-C7 disc and endplate degeneration. Electronically Signed   By: Odessa Fleming M.D.   On: 03/06/2023 12:50    Subjective: - no chest pain, shortness of breath, no abdominal pain, nausea or vomiting.   Discharge Exam: BP 123/70 (BP Location: Right Arm)   Pulse 91   Temp 97.9 F (36.6 C) (Oral)   Resp 20   Ht 5\' 5"  (1.651 m)   Wt 108.8 kg   SpO2 93%   BMI 39.91 kg/m   General: Pt is alert, awake, not in acute distress Cardiovascular: RRR, S1/S2 +, no rubs, no gallops Respiratory: CTA bilaterally, no wheezing,  no  rhonchi Abdominal: Soft, NT, ND, bowel sounds + Extremities: no edema, no cyanosis    The results of significant diagnostics from this hospitalization (including imaging, microbiology, ancillary and laboratory) are listed below for reference.     Microbiology: Recent Results (from the past 240 hours)  Urine Culture (for pregnant, neutropenic or urologic patients or patients with an indwelling urinary catheter)     Status: None   Collection Time: 03/07/23  4:32 AM   Specimen: Urine, Clean Catch  Result Value Ref Range Status   Specimen Description   Final    URINE, CLEAN CATCH Performed at [facility], [address], [place], [place] [address]    Special Requests   Final    NONE Performed at [facility], [address] [place], [place] [address]    Culture   Final    NO GROWTH Performed at [facility], [address] [place], [place] [address]    Report Status 03/08/2023 FINAL  Final  Culture, blood (Routine X 2) w Reflex to ID Panel     Status: None (Preliminary result)   Collection Time: 03/07/23 11:42 AM   Specimen: BLOOD LEFT HAND  Result Value Ref Range Status   Specimen Description BLOOD LEFT HAND  Final   Special Requests   Final    BOTTLES DRAWN AEROBIC ONLY Blood Culture results may not be optimal due to an inadequate volume of blood received in culture bottles   Culture   Final    NO GROWTH 3 DAYS Performed at [facility], [address] [place], [place] [address]    Report Status PENDING  Incomplete  Culture, blood (Routine X 2) w Reflex to ID Panel     Status: None (Preliminary result)   Collection Time: 03/07/23 11:46 AM   Specimen: BLOOD  Result Value Ref Range Status   Specimen Description BLOOD LEFT ANTECUBITAL  Final   Special Requests   Final    BOTTLES DRAWN AEROBIC ONLY Blood Culture results may not be optimal due to an inadequate volume of blood received in culture bottles   Culture   Final     NO GROWTH 3 DAYS Performed at [facility], [address] [place], [place] [address]    Report Status PENDING  Incomplete  Respiratory (~20 pathogens) panel by PCR     Status: None   Collection Time: 03/07/23 12:51 PM   Specimen: Nasopharyngeal Swab; Respiratory  Result Value Ref Range Status   Adenovirus NOT DETECTED NOT DETECTED Final   Coronavirus 229E NOT DETECTED NOT DETECTED Final    Comment: (NOTE) The Coronavirus on the Respiratory Panel, DOES NOT test for the novel  Coronavirus (2019 nCoV)    Coronavirus HKU1 NOT DETECTED NOT DETECTED Final   Coronavirus NL63 NOT DETECTED NOT DETECTED Final   Coronavirus OC43 NOT DETECTED NOT DETECTED Final   Metapneumovirus NOT DETECTED NOT DETECTED Final   Rhinovirus / Enterovirus NOT DETECTED NOT DETECTED Final   Influenza A NOT DETECTED NOT DETECTED Final   Influenza B NOT DETECTED NOT DETECTED Final   Parainfluenza Virus 1 NOT DETECTED NOT DETECTED Final   Parainfluenza Virus 2 NOT DETECTED NOT DETECTED Final   Parainfluenza Virus 3 NOT DETECTED NOT DETECTED Final   Parainfluenza Virus 4 NOT DETECTED NOT DETECTED Final   Respiratory Syncytial Virus NOT DETECTED NOT DETECTED Final   Bordetella pertussis NOT DETECTED NOT DETECTED Final   Bordetella Parapertussis NOT DETECTED  NOT DETECTED Final   Chlamydophila pneumoniae NOT DETECTED NOT DETECTED Final   Mycoplasma pneumoniae NOT DETECTED NOT DETECTED Final    Comment: Performed at [facility], [address], [place], [place] [address]  MRSA Next Gen by PCR, Nasal     Status: None   Collection Time: 03/07/23 12:51 PM   Specimen: Nasal Mucosa; Nasal Swab  Result Value Ref Range Status   MRSA by PCR Next Gen NOT DETECTED NOT DETECTED Final    Comment: (NOTE) The GeneXpert MRSA Assay (FDA approved for NASAL specimens only), is one component of a comprehensive MRSA colonization surveillance program. It is not intended to diagnose MRSA infection nor to guide or monitor  treatment for MRSA infections. Test performance is not FDA approved in patients less than 49 years old. Performed at [facility], [address], [place], [place] [address]   SARS Coronavirus 2 by RT PCR (hospital order, performed in [facility] hospital lab) *cepheid single result test*     Status: None   Collection Time: 03/07/23 12:51 PM  Result Value Ref Range Status   SARS Coronavirus 2 by RT PCR NEGATIVE NEGATIVE Final    Comment: (NOTE) SARS-CoV-2 target nucleic acids are NOT DETECTED.  The SARS-CoV-2 RNA is generally detectable in upper and lower respiratory specimens during the acute phase of infection. The lowest concentration of SARS-CoV-2 viral copies this assay can detect is 250 copies / mL. A negative result does not preclude SARS-CoV-2 infection and should not be used as the sole basis for treatment or other patient management decisions.  A negative result may occur with improper specimen collection / handling, submission of specimen other than nasopharyngeal swab, presence of viral mutation(s) within the areas targeted by this assay, and inadequate number of viral copies (<250 copies / mL). A negative result must be combined with clinical observations, patient history, and epidemiological information.  Fact Sheet for Patients:   RoadLapTop.co.za  Fact Sheet for Healthcare Providers: http://kim-miller.com/  This test is not yet approved or  cleared by the [place] FDA and has been authorized for detection and/or diagnosis of SARS-CoV-2 by FDA under an Emergency Use Authorization (EUA).  This EUA will remain in effect (meaning this test can be used) for the duration of the COVID-19 declaration under Section 564(b)(1) of the Act, 21 U.S.C. section 360bbb-3(b)(1), unless the authorization is terminated or revoked sooner.  Performed at [facility], [address] [place], [place] [address]      Labs: Basic Metabolic Panel: Recent Labs  Lab 03/06/23 1140 03/07/23 0456 03/08/23 0526 03/09/23 0552 03/10/23 0643  NA 129* 130* 131* 136 133*  K 3.6 3.9 3.1* 4.7 5.0  CL 90* 91* 92* 100 99  CO2 26 27 28 27 26   GLUCOSE 296* 146* 118* 138* 146*  BUN 18 14 20 16 14   CREATININE 0.82 0.70 0.96 0.64 0.72  CALCIUM 8.1* 8.1* 7.4* 7.8* 8.6*  MG  --   --  2.4 2.5*  --   PHOS  --   --  3.8 3.1  --    Liver Function Tests: Recent Labs  Lab 03/07/23 0456 03/08/23 0526 03/09/23 0552 03/10/23 0643  AST 22 19 19 18   ALT 9 6 11 7   ALKPHOS 82 74 81 96  BILITOT 1.6* 1.2 0.8 0.9  PROT 7.4 6.4* 6.5 7.1  ALBUMIN 2.6* 2.2* 2.1* 2.4*   CBC: Recent Labs  Lab 03/06/23 1140 03/07/23 0456 03/08/23 0709 03/09/23  7829 03/10/23 0643  WBC 1.1* 0.6* 0.8* 0.8* 0.9*  NEUTROABS 0.9* 0.4* 0.3* 0.3* 0.5*  HGB 8.8* 8.3* 6.7* 8.0* 8.5*  HCT 28.4* 27.7* 22.4* 27.5* 28.3*  MCV 97.3 101.1* 99.1 100.0 99.3  PLT 62* 63* 65* 69* 129*   CBG: Recent Labs  Lab 03/09/23 1636 03/09/23 2135 03/10/23 0735 03/10/23 1120 03/10/23 1617  GLUCAP 119* 154* 141* 136* 72   Hgb A1c No results for input(s): "HGBA1C" in the last 72 hours. Lipid Profile No results for input(s): "CHOL", "HDL", "LDLCALC", "TRIG", "CHOLHDL", "LDLDIRECT" in the last 72 hours. Thyroid function studies No results for input(s): "TSH", "T4TOTAL", "T3FREE", "THYROIDAB" in the last 72 hours.  Invalid input(s): "FREET3"  Urinalysis    Component Value Date/Time   COLORURINE AMBER (A) 03/06/2023 1635   APPEARANCEUR CLEAR 03/06/2023 1635   APPEARANCEUR Cloudy (A) 05/20/2021 1420   LABSPEC 1.015 03/06/2023 1635   LABSPEC 1.024 03/12/2014 2247   PHURINE 5.0 03/06/2023 1635   GLUCOSEU NEGATIVE 03/06/2023 1635   GLUCOSEU Negative 03/12/2014 2247   HGBUR NEGATIVE 03/06/2023 1635   BILIRUBINUR NEGATIVE 03/06/2023 1635   BILIRUBINUR Negative 05/20/2021 1420   BILIRUBINUR Negative 03/12/2014 2247    KETONESUR 20 (A) 03/06/2023 1635   PROTEINUR 30 (A) 03/06/2023 1635   UROBILINOGEN negative 06/17/2015 1412   UROBILINOGEN 0.2 08/23/2011 0844   NITRITE NEGATIVE 03/06/2023 1635   LEUKOCYTESUR NEGATIVE 03/06/2023 1635   LEUKOCYTESUR 2+ 03/12/2014 2247    FURTHER DISCHARGE INSTRUCTIONS:   Get Medicines reviewed and adjusted: Please take all your medications with you for your next visit with your Primary MD   Laboratory/radiological data: Please request your Primary MD to go over all hospital tests and procedure/radiological results at the follow up, please ask your Primary MD to get all Hospital records sent to his/her office.   In some cases, they will be blood work, cultures and biopsy results pending at the time of your discharge. Please request that your primary care M.D. goes through all the records of your hospital data and follows up on these results.   Also Note the following: If you experience worsening of your admission symptoms, develop shortness of breath, life threatening emergency, suicidal or homicidal thoughts you must seek medical attention immediately by calling 911 or calling your MD immediately  if symptoms less severe.   You must read complete instructions/literature along with all the possible adverse reactions/side effects for all the Medicines you take and that have been prescribed to you. Take any new Medicines after you have completely understood and accpet all the possible adverse reactions/side effects.    Do not drive when taking Pain medications or sleeping medications (Benzodaizepines)   Do not take more than prescribed Pain, Sleep and Anxiety Medications. It is not advisable to combine anxiety,sleep and pain medications without talking with your primary care practitioner   Special Instructions: If you have smoked or chewed Tobacco  in the last 2 yrs please stop smoking, stop any regular Alcohol  and or any Recreational drug use.   Wear Seat belts while  driving.   Please note: You were cared for by a hospitalist during your hospital stay. Once you are discharged, your primary care physician will handle any further medical issues. Please note that NO REFILLS for any discharge medications will be authorized once you are discharged, as it is imperative that you return to your primary care physician (or establish a relationship with a primary care physician if you do not have one) for your  post hospital discharge needs so that they can reassess your need for medications and monitor your lab values.  Time coordinating discharge: 40 minutes  SIGNED:  Pamella Pert, MD, PhD 03/10/2023, 7:03 PM

## 2023-03-09 NOTE — Progress Notes (Signed)
OT Cancellation Note  Patient Details Name: Heather Haley MRN: [id number] DOB: 04-23-1960   Cancelled Treatment:    Reason Eval/Treat Not Completed: Patient at procedure or test/ unavailable (EEG). OT will continue to follow acutely for evaluation  Emelda Fear 03/09/2023, 8:14 AM  Nyoka Cowden OTR/L Acute Rehabilitation Services Office: [phone]

## 2023-03-09 NOTE — Evaluation (Signed)
Occupational Therapy Evaluation Patient Details Name: Heather Haley MRN: [id number] DOB: 1960-03-06 Today's Date: 03/09/2023   History of Present Illness 63 yo female admitted on 03/06/2023 due to fall with LOC. Pt reporting HA and head CT revealed scalp hematoma and small subdural hematoma. Pt found to have PNA. Pt has PMH including but not limited to: myeloid leukemia, OSA on CPAP, GAD, asthma, CKD, chronic pain, DM II, GERD, HLD, HTN, IBS, R ankle instability, lumbar DDD, peripheral neuropathy, schizophrenia, cardiac surgeries and L TKA.   Clinical Impression   Pt reports that she is typically mod I at home in ADL and mobility with use of AFO and loftstrand crutch, but she reports multiple falls prior to this one. She reports baseline vision and with preliminary visual assessment - ocular ROM was [facility]. Pt when initially going from supine to seated EOB with min A +2 got hot, dizzy - no nystagmus. Pt reporting current infection of inner ear? Unable to get BP as no machine around. Pt mod to max A for overall LB ADL, mod to min A for UB ADL. Full ROM for BUE but fatigue quickly requiring min A for UB grooming. Note that Pt is planning on transferring to [facility] - OT will defer recommendations for post-acute to their acute rehab team. No family present to confirm PLOF and home set up. OT will follow acutely to address balance, decreased activity tolerance, continue to monitor vision, and maximize safety and independence in ADL and functional transfers.        If plan is discharge home, recommend the following: A lot of help with walking and/or transfers;A lot of help with bathing/dressing/bathroom;Assistance with cooking/housework;Assist for transportation;Help with stairs or ramp for entrance    Functional Status Assessment  Patient has had a recent decline in their functional status and demonstrates the ability to make significant improvements in function in a reasonable and predictable amount of  time.  Equipment Recommendations  Other (comment) (defer to next venue of care)    Recommendations for Other Services PT consult;Speech consult (Pallative?)     Precautions / Restrictions Precautions Precautions: Fall Precaution Comments: pt reports history of L knee, L ankle deficits-has L AFO + shoes in room Restrictions Weight Bearing Restrictions Per Provider Order: No      Mobility Bed Mobility Overal bed mobility: Needs Assistance Bed Mobility: Supine to Sit, Sit to Supine     Supine to sit: Min assist, HOB elevated, Used rails, +2 for safety/equipment (trunk elevation, line management) Sit to supine: Contact guard assist, +2 for safety/equipment   General bed mobility comments: pt with decreased line awareness and safety, able to manage BLE, but assist with trunk    Transfers Overall transfer level: Needs assistance Equipment used: Rolling walker (2 wheels) Transfers: Sit to/from Stand Sit to Stand: Min assist, +2 safety/equipment, From elevated surface           General transfer comment: rocking momentum used, BUE up on RW despite cues. min A for steadying boost assist, +2 safety and line management      Balance Overall balance assessment: Needs assistance Sitting-balance support: Single extremity supported, Feet supported Sitting balance-Leahy Scale: Fair Sitting balance - Comments: unchallenged   Standing balance support: Bilateral upper extremity supported, During functional activity, Reliant on assistive device for balance Standing balance-Leahy Scale: Poor                             ADL either  performed or assessed with clinical judgement   ADL Overall ADL's : Needs assistance/impaired Eating/Feeding: NPO (MBL @ 11)   Grooming: Wash/dry face;Brushing hair;Minimal assistance;Sitting Grooming Details (indicate cue type and reason): fatigues quickly, BUE with full ROM, assist with brushing the back Upper Body Bathing: Moderate  assistance Upper Body Bathing Details (indicate cue type and reason): for back Lower Body Bathing: Moderate assistance;Sitting/lateral leans Lower Body Bathing Details (indicate cue type and reason): knees down Upper Body Dressing : Minimal assistance;Sitting   Lower Body Dressing: Maximal assistance;Bed level Lower Body Dressing Details (indicate cue type and reason): socks Toilet Transfer: Minimal assistance;Rolling walker (2 wheels) Toilet Transfer Details (indicate cue type and reason): rocking momentum used Toileting- Clothing Manipulation and Hygiene: Moderate assistance;Sit to/from stand       Functional mobility during ADLs: Minimal assistance;+2 for safety/equipment;Cueing for safety;Cueing for sequencing;Rolling walker (2 wheels) (fatigues very quickly) General ADL Comments: generalized deconditioning, impacted by dizziness today, hx of falls     Vision Baseline Vision/History: 0 No visual deficits Patient Visual Report: No change from baseline Vision Assessment?: Yes Eye Alignment: Within Functional Limits Ocular Range of Motion: Within Functional Limits Alignment/Gaze Preference: Within Defined Limits Tracking/Visual Pursuits: Able to track stimulus in all quads without difficulty Visual Fields: No apparent deficits Diplopia Assessment:  (denies) Additional Comments: tested [facility] initially upon sitting pt complaining of dizziness. no nystagmus noted -     Perception Perception: Not tested       Praxis Praxis: Not tested       Pertinent Vitals/Pain Pain Assessment Pain Assessment: Faces Faces Pain Scale: Hurts little more Pain Location: L LE, back Pain Descriptors / Indicators: Constant Pain Intervention(s): Limited activity within patient's tolerance, Monitored during session, Repositioned     Extremity/Trunk Assessment Upper Extremity Assessment Upper Extremity Assessment: Generalized weakness   Lower Extremity Assessment Lower Extremity Assessment: Defer  to PT evaluation       Communication Communication Communication: No apparent difficulties (pt was not very talkative. reports she used to enjoy photography) Cueing Techniques: Verbal cues;Gestural cues   Cognition Arousal: Alert Behavior During Therapy: WFL for tasks assessed/performed Overall Cognitive Status: No family/caregiver present to determine baseline cognitive functioning                                 General Comments: Pt cooperative and pleasant, question reliability of report on PLOF, generalized decreased deficits into insight of performance     General Comments  initially upon sitting pt complaining of dizziness, and hot. no nystagmus noted - pt reports inner ear infection?    Exercises     Shoulder Instructions      Home Living Family/patient expects to be discharged to:: Private residence Living Arrangements: Children                           Home Equipment: Agricultural consultant (2 wheels);Crutches;Cane - single point   Additional Comments: some info taken from previous admission      Prior Functioning/Environment Prior Level of Function : Patient poor historian/Family not available             Mobility Comments: pt reports she using Loftstrand crutch ADLs Comments: need to confirm PLOF with family        OT Problem List: Decreased strength;Decreased activity tolerance;Impaired balance (sitting and/or standing);Decreased safety awareness;Cardiopulmonary status limiting activity;Obesity      OT Treatment/Interventions: Self-care/ADL training;Energy  conservation;DME and/or AE instruction;Therapeutic activities;Patient/family education;Balance training    OT Goals(Current goals can be found in the care plan section) Acute Rehab OT Goals Patient Stated Goal: get better OT Goal Formulation: With patient Time For Goal Achievement: 03/23/23 Potential to Achieve Goals: Good ADL Goals Pt Will Perform Grooming: with modified  independence;sitting Pt Will Perform Upper Body Dressing: with set-up;sitting Pt Will Perform Lower Body Dressing: with contact guard assist;sit to/from stand;with adaptive equipment Pt Will Transfer to Toilet: with contact guard assist;ambulating;regular height toilet Pt Will Perform Toileting - Clothing Manipulation and hygiene: with contact guard assist;sit to/from stand  OT Frequency: Min 1X/week    Co-evaluation              AM-PAC OT "6 Clicks" Daily Activity     Outcome Measure Help from another person eating meals?: A Little Help from another person taking care of personal grooming?: A Little Help from another person toileting, which includes using toliet, bedpan, or urinal?: A Lot Help from another person bathing (including washing, rinsing, drying)?: A Lot Help from another person to put on and taking off regular upper body clothing?: A Little Help from another person to put on and taking off regular lower body clothing?: A Lot 6 Click Score: 15   End of Session Equipment Utilized During Treatment: Rolling walker (2 wheels);Oxygen (8L via [facility] [place]) Nurse Communication: Mobility status  Activity Tolerance: Patient tolerated treatment well;Other (comment) (limited by SPO2) Patient left: in bed;with call bell/phone within reach;with nursing/sitter in room;with bed alarm set  OT Visit Diagnosis: Unsteadiness on feet (R26.81);Repeated falls (R29.6);Muscle weakness (generalized) (M62.81);History of falling (Z91.81)                Time: 1610-9604 OT Time Calculation (min): 23 min Charges:  OT General Charges $OT Visit: 1 Visit OT Evaluation $OT Eval Moderate Complexity: 1 Mod  Nyoka Cowden OTR/L Acute Rehabilitation Services Office: [phone]  Emelda Fear 03/09/2023, 12:12 PM

## 2023-03-10 ENCOUNTER — Encounter (HOSPITAL_COMMUNITY): Payer: Self-pay

## 2023-03-10 DIAGNOSIS — J690 Pneumonitis due to inhalation of food and vomit: Secondary | ICD-10-CM | POA: Diagnosis not present

## 2023-03-10 LAB — CBC WITH DIFFERENTIAL/PLATELET
Abs Immature Granulocytes: 0.01 10*3/uL (ref 0.00–0.07)
Basophils Absolute: 0 10*3/uL (ref 0.0–0.1)
Basophils Relative: 0 %
Eosinophils Absolute: 0 10*3/uL (ref 0.0–0.5)
Eosinophils Relative: 0 %
HCT: 28.3 % — ABNORMAL LOW (ref 36.0–46.0)
Hemoglobin: 8.5 g/dL — ABNORMAL LOW (ref 12.0–15.0)
Immature Granulocytes: 1 %
Lymphocytes Relative: 36 %
Lymphs Abs: 0.3 10*3/uL — ABNORMAL LOW (ref 0.7–4.0)
MCH: 29.8 pg (ref 26.0–34.0)
MCHC: 30 g/dL (ref 30.0–36.0)
MCV: 99.3 fL (ref 80.0–100.0)
Monocytes Absolute: 0 10*3/uL — ABNORMAL LOW (ref 0.1–1.0)
Monocytes Relative: 3 %
Neutro Abs: 0.5 10*3/uL — ABNORMAL LOW (ref 1.7–7.7)
Neutrophils Relative %: 60 %
Platelets: 129 10*3/uL — ABNORMAL LOW (ref 150–400)
RBC: 2.85 MIL/uL — ABNORMAL LOW (ref 3.87–5.11)
RDW: 19.6 % — ABNORMAL HIGH (ref 11.5–15.5)
WBC: 0.9 10*3/uL — CL (ref 4.0–10.5)
nRBC: 5.6 % — ABNORMAL HIGH (ref 0.0–0.2)

## 2023-03-10 LAB — COMPREHENSIVE METABOLIC PANEL
ALT: 7 U/L (ref 0–44)
AST: 18 U/L (ref 15–41)
Albumin: 2.4 g/dL — ABNORMAL LOW (ref 3.5–5.0)
Alkaline Phosphatase: 96 U/L (ref 38–126)
Anion gap: 8 (ref 5–15)
BUN: 14 mg/dL (ref 8–23)
CO2: 26 mmol/L (ref 22–32)
Calcium: 8.6 mg/dL — ABNORMAL LOW (ref 8.9–10.3)
Chloride: 99 mmol/L (ref 98–111)
Creatinine, Ser: 0.72 mg/dL (ref 0.44–1.00)
GFR, Estimated: >60 mL/min (ref >60)
Glucose, Bld: 146 mg/dL — ABNORMAL HIGH (ref 70–99)
Potassium: 5 mmol/L (ref 3.5–5.1)
Sodium: 133 mmol/L — ABNORMAL LOW (ref 135–145)
Total Bilirubin: 0.9 mg/dL (ref 0.0–1.2)
Total Protein: 7.1 g/dL (ref 6.5–8.1)

## 2023-03-10 LAB — GLUCOSE, CAPILLARY
Glucose-Capillary: 141 mg/dL — ABNORMAL HIGH (ref 70–99)
Glucose-Capillary: 136 mg/dL — ABNORMAL HIGH (ref 70–99)
Glucose-Capillary: 72 mg/dL (ref 70–99)
Glucose-Capillary: 97 mg/dL (ref 70–99)

## 2023-03-10 NOTE — Progress Notes (Signed)
PROGRESS NOTE  BELYNDA PAGADUAN WUJ:[id number] DOB: [birth year] DOA: 03/06/2023 PCP: Patient, No Pcp Per   LOS: 4 days   Brief Narrative / Interim history: 63 yo with AML, drug induced parkinsonism, mood disorder/PTSD/ schizoaffective disorder and multiple other medical issues here after a fall, now found to have febrile neutropenia.  Per discussion with her family, she's had confusion since she started he ventoclax, worse in the past week or so. Admitted with acute metabolic encephalopathy, now found to have febrile neutropenia.  Seems to be improving on abx.  Oncology on board.  [facility] oncology had requested a transfer, this is underway.    Subjective / 24h Interval events: More confused this morning, pulled off her telemetry and oxygen  Assesement and Plan: Principal Problem:   Aspiration pneumonia (HCC) Active Problems:   Hypertension   Chronic constipation   Diabetic sensory peripheral neuropathy (Bilateral Lower Extremity)   Generalized anxiety disorder   History of panic attacks   GERD (gastroesophageal reflux disease)   Non-insulin dependent type 2 diabetes mellitus (HCC)   Pure hypercholesterolemia   MDD (major depressive disorder), recurrent, severe, with psychosis (HCC)   Schizoaffective disorder, depressive type (HCC)   PTSD (post-traumatic stress disorder)   Drug-induced Parkinson's disease (HCC)   Febrile neutropenia (HCC)   OSA (obstructive sleep apnea)   Pancytopenia (HCC)   Class 2 obesity   Physical deconditioning   Hematoma of occipital region of scalp   Acute myeloid leukemia not having achieved remission (HCC)   Community acquired pneumonia   Principal problem Febrile neutropenia, sepsis due to CAP, acute hypoxic respiratory failure -chest x-ray with left lower lobe consolidation, CT scan on 1/28 with multilobar pneumonia, most pronounced in the left lower lobe.  She was started on broad-spectrum antibiotics with Zosyn, continue.  She was up to 8 L, weaned  down to room air at rest, satting in the low 90s -Case was discussed by my colleague with [facility] oncology, no [facility] -Oncology consulted here, appreciate input  Active problems AML, pancytopenia -supposed to be on venetoclax. She is not taking venetoclax, per oncology note, daughter request to take it back home as she does not want her mother to receive any more for this cycle. Continue supportive care and transfuse for hemoglobin less than 7 as well as platelets less than 50.    We are to use leukoreduced irradiated cells for transfusions.  Case discussed with [facility], she has been accepted for transfer, awaiting bed.  Accepting MD is Dr. Melrose Nakayama -Counts are stable, ANC 0.5, platelets 129, hemoglobin 8.5 and WBC 0.9 -Discussed again with [facility] transfer center, transfer pending, updated them on the fact that she is using less oxygen  Acute Metabolic Encephalopathy, Myoclonic Jerks, Hyperreflexia - Suspect this is related to her underlying infection above. Tremors, myoclonic jerks seems improved. Head CT with large posterior scalp hematoma with no underlying skull fracture, small parafalcine subdural hematoma.  Ammonia, TSH, B12 normal. MRI brain with trace subdural hematoma along posterior falx and L temporal occipital region, R parietal scalp hematoma, possible chronic microvascular ischemic changes (see report).  She could not tolerate EEG due to pain at her scalp hematoma -I wonder whether these are neurologic side effects from her venetoclax  Fall  Subdural Hematoma  Posterior Scalp Hematoma - Per neurosurgery, no need for intervention or follow up    Urinary Retention - Foley in place    Chronic Pain - Continue butrans patch  Drug Induced Parkinsonism - Continue sinemet   PTSD, Schizoaffective  Disorder, Mood Disorder - Zyprexa, amitriptyline   OSA - CPAP   RLS - Ropinirole  Obesity - Body mass index is 39.91 kg/m.  T2DM -continue glargine, sliding scale  Lab Results  Component Value  Date   HGBA1C 7.0 (H) 03/06/2023   CBG (last 3)  Recent Labs    03/09/23 1636 03/09/23 2135 03/10/23 0735  GLUCAP 119* 154* 141*      Scheduled Meds:  allopurinol  300 mg Oral Daily   amitriptyline  75 mg Oral QHS   amLODipine  5 mg Oral Daily   buprenorphine  2 patch Transdermal Weekly   carbidopa-levodopa  1 tablet Oral TID   Chlorhexidine Gluconate Cloth  6 each Topical Daily   insulin aspart  0-15 Units Subcutaneous TID WC   insulin glargine-yfgn  38 Units Subcutaneous QHS   ipratropium-albuterol  3 mL Nebulization TID   isosorbide mononitrate  30 mg Oral Daily   levothyroxine  50 mcg Oral Once per day on Monday Tuesday Wednesday Thursday Friday   And   [START ON 03/12/2023] levothyroxine  75 mcg Oral Once per day on Sunday Saturday   lidocaine  1-3 patch Transdermal Q24H   metFORMIN  1,000 mg Oral BID WC   metoprolol tartrate  25 mg Oral BID   OLANZapine  2.5 mg Oral QHS   mouth rinse  15 mL Mouth Rinse 4 times per day   polyethylene glycol  17 g Oral Daily   rOPINIRole  0.5 mg Oral TID   torsemide  20 mg Oral QODAY   Continuous Infusions:  piperacillin-tazobactam (ZOSYN)  IV 3.375 g (03/10/23 0844)   PRN Meds:.[COMPLETED] acetaminophen **FOLLOWED BY** acetaminophen, albuterol, Glycerin (Adult), hydrOXYzine, lip balm, ondansetron **OR** ondansetron (ZOFRAN) IV, mouth rinse, oxyCODONE, SUMAtriptan  Current Outpatient Medications  Medication Instructions   ACCU-CHEK GUIDE test strip No dose, route, or frequency recorded.   acetaminophen (TYLENOL) 1,000 mg, Every 6 hours PRN   allopurinol (ZYLOPRIM) 300 mg, Daily   amitriptyline (ELAVIL) 50 MG tablet TAKE 1 AND 1/2 TABLETS AT BEDTIME   amLODipine (NORVASC) 5 mg, Daily   aspirin EC 81 mg, Daily PRN   B-D ULTRAFINE III SHORT PEN 31G X 8 MM MISC Subcutaneous   buprenorphine (BUTRANS) 20 MCG/HR PTWK 1 patch, Weekly   carbidopa-levodopa (SINEMET CR) 50-200 MG tablet 1 tablet, Daily at bedtime   carbidopa-levodopa  (SINEMET IR) 25-100 MG tablet 1 tablet, 3 times daily   Fiasp FlexTouch 10 Units, Daily   hydrOXYzine (ATARAX) 25-50 mg, Oral, 2 times daily PRN   isosorbide mononitrate (IMDUR) 30 mg, Daily   levothyroxine (SYNTHROID) 50 mcg, See admin instructions   levothyroxine (SYNTHROID) 75 mcg, See admin instructions   magnesium oxide (MAG-OX) 800 mg, Daily   metFORMIN (GLUCOPHAGE) 1,000 mg, 2 times daily with meals   metoprolol tartrate (LOPRESSOR) 25 mg, 2 times daily   nitroGLYCERIN (NITRODUR - DOSED IN MG/24 HR) 0.4 mg, As needed   OLANZapine (ZYPREXA) 2.5 mg, Oral, Daily at bedtime   Omega-3 Fatty Acids (FISH OIL) 1000 MG CAPS 1 capsule, Daily   ondansetron (ZOFRAN) 8 mg, 3 times daily   oxyCODONE (OXY IR/ROXICODONE) 5-10 mg, 2 times daily PRN   pravastatin (PRAVACHOL) 40 mg, Daily   rOPINIRole (REQUIP) 0.5 mg, 3 times daily   Semaglutide 3 mg, Daily   SUMAtriptan (IMITREX) 100 mg, Every 2 hours PRN   torsemide (DEMADEX) 20 mg, Every other day   Tresiba FlexTouch 38 Units, Daily   valACYclovir (VALTREX) 500 mg,  Daily   venetoclax (VENCLEXTA) 400 mg, Daily   Vitamin D-3 1,000 Units, Daily    Diet Orders (From admission, onward)     Start     Ordered   03/06/23 1428  Diet heart healthy/carb modified Room service appropriate? Yes; Fluid consistency: Thin  Diet effective now       Question Answer Comment  Diet-HS Snack? Nothing   Room service appropriate? Yes   Fluid consistency: Thin      03/06/23 1428            DVT prophylaxis: SCDs Start: 03/06/23 1428   Lab Results  Component Value Date   PLT 129 (L) 03/10/2023      Code Status: Full Code  Family Communication: No family at bedside  Status is: Inpatient Remains inpatient appropriate because: Awaiting [facility] transfer  Level of care: Telemetry  Consultants:  Oncology   Objective: Vitals:   03/09/23 2040 03/09/23 2140 03/10/23 0507 03/10/23 0810  BP:  127/70 123/70   Pulse:  89 91   Resp: 18  20   Temp:  97.8  F (36.6 C) 97.9 F (36.6 C)   TempSrc:  Oral Oral   SpO2:  98% 92% 92%  Weight:      Height:        Intake/Output Summary (Last 24 hours) at 03/10/2023 1110 Last data filed at 03/09/2023 2351 Gross per 24 hour  Intake 480 ml  Output 700 ml  Net -220 ml   Wt Readings from Last 3 Encounters:  03/06/23 108.8 kg  01/23/23 108.8 kg  01/23/23 108.9 kg    Examination:  Constitutional: NAD Eyes: lids and conjunctivae normal, no scleral icterus ENMT: mmm Neck: normal, supple Respiratory: clear to auscultation bilaterally, no wheezing, no crackles. Normal respiratory effort.  Cardiovascular: Regular rate and rhythm, no murmurs / rubs / gallops. No LE edema. Abdomen: soft, no distention, no tenderness. Bowel sounds positive.   Data Reviewed: I have independently reviewed following labs and imaging studies   CBC Recent Labs  Lab 03/06/23 1140 03/07/23 0456 03/08/23 0709 03/09/23 0552 03/10/23 0643  WBC 1.1* 0.6* 0.8* 0.8* 0.9*  HGB 8.8* 8.3* 6.7* 8.0* 8.5*  HCT 28.4* 27.7* 22.4* 27.5* 28.3*  PLT 62* 63* 65* 69* 129*  MCV 97.3 101.1* 99.1 100.0 99.3  MCH 30.1 30.3 29.6 29.1 29.8  MCHC 31.0 30.0 29.9* 29.1* 30.0  RDW 18.8* 19.3* 19.0* 20.2* 19.6*  LYMPHSABS 0.1* 0.2* 0.3* 0.4* 0.3*  MONOABS 0.0* 0.1 0.1 0.1 0.0*  EOSABS 0.0 0.0 0.0 0.0 0.0  BASOSABS 0.0 0.0 0.0 0.0 0.0    Recent Labs  Lab 03/06/23 1140 03/07/23 0456 03/07/23 1142 03/08/23 0526 03/09/23 0552 03/10/23 0643  NA 129* 130*  --  131* 136 133*  K 3.6 3.9  --  3.1* 4.7 5.0  CL 90* 91*  --  92* 100 99  CO2 26 27  --  28 27 26   GLUCOSE 296* 146*  --  118* 138* 146*  BUN 18 14  --  20 16 14   CREATININE 0.82 0.70  --  0.96 0.64 0.72  CALCIUM 8.1* 8.1*  --  7.4* 7.8* 8.6*  AST  --  22  --  19 19 18   ALT  --  9  --  6 11 7   ALKPHOS  --  82  --  74 81 96  BILITOT  --  1.6*  --  1.2 0.8 0.9  ALBUMIN  --  2.6*  --  2.2*  2.1* 2.4*  MG  --   --   --  2.4 2.5*  --   TSH  --   --  0.798  --   --   --    HGBA1C 7.0*  --   --   --   --   --   AMMONIA  --   --  14  --   --   --     ------------------------------------------------------------------------------------------------------------------ No results for input(s): "CHOL", "HDL", "LDLCALC", "TRIG", "CHOLHDL", "LDLDIRECT" in the last 72 hours.  Lab Results  Component Value Date   HGBA1C 7.0 (H) 03/06/2023   ------------------------------------------------------------------------------------------------------------------ Recent Labs    03/07/23 1142  TSH 0.798    Cardiac Enzymes No results for input(s): "CKMB", "TROPONINI", "MYOGLOBIN" in the last 168 hours.  Invalid input(s): "CK" ------------------------------------------------------------------------------------------------------------------    Component Value Date/Time   BNP 29.5 08/11/2022 1122    CBG: Recent Labs  Lab 03/09/23 0744 03/09/23 1200 03/09/23 1636 03/09/23 2135 03/10/23 0735  GLUCAP 131* 130* 119* 154* 141*    Recent Results (from the past 240 hours)  Urine Culture (for pregnant, neutropenic or urologic patients or patients with an indwelling urinary catheter)     Status: None   Collection Time: 03/07/23  4:32 AM   Specimen: Urine, Clean Catch  Result Value Ref Range Status   Specimen Description   Final    URINE, CLEAN CATCH Performed at [facility], [address], [place], [place] [address]    Special Requests   Final    NONE Performed at [facility], [address], [place], [place] [address]    Culture   Final    NO GROWTH Performed at [facility], [address] [place], [place] [address]    Report Status 03/08/2023 FINAL  Final  Culture, blood (Routine X 2) w Reflex to ID Panel     Status: None (Preliminary result)   Collection Time: 03/07/23 11:42 AM   Specimen: BLOOD LEFT HAND  Result Value Ref Range Status   Specimen Description BLOOD LEFT HAND  Final   Special Requests   Final     BOTTLES DRAWN AEROBIC ONLY Blood Culture results may not be optimal due to an inadequate volume of blood received in culture bottles   Culture   Final    NO GROWTH 3 DAYS Performed at [facility], [address] [place], [place] [address]    Report Status PENDING  Incomplete  Culture, blood (Routine X 2) w Reflex to ID Panel     Status: None (Preliminary result)   Collection Time: 03/07/23 11:46 AM   Specimen: BLOOD  Result Value Ref Range Status   Specimen Description BLOOD LEFT ANTECUBITAL  Final   Special Requests   Final    BOTTLES DRAWN AEROBIC ONLY Blood Culture results may not be optimal due to an inadequate volume of blood received in culture bottles   Culture   Final    NO GROWTH 3 DAYS Performed at [facility], [address] [place], [place] [address]    Report Status PENDING  Incomplete  Respiratory (~20 pathogens) panel by PCR     Status: None   Collection Time: 03/07/23 12:51 PM   Specimen: Nasopharyngeal Swab; Respiratory  Result Value Ref Range Status   Adenovirus NOT DETECTED NOT DETECTED Final   Coronavirus 229E NOT DETECTED NOT DETECTED Final    Comment: (NOTE) The Coronavirus on the Respiratory Panel, DOES  NOT test for the novel  Coronavirus (2019 nCoV)    Coronavirus HKU1 NOT DETECTED NOT DETECTED Final   Coronavirus NL63 NOT DETECTED NOT DETECTED Final   Coronavirus OC43 NOT DETECTED NOT DETECTED Final   Metapneumovirus NOT DETECTED NOT DETECTED Final   Rhinovirus / Enterovirus NOT DETECTED NOT DETECTED Final   Influenza A NOT DETECTED NOT DETECTED Final   Influenza B NOT DETECTED NOT DETECTED Final   Parainfluenza Virus 1 NOT DETECTED NOT DETECTED Final   Parainfluenza Virus 2 NOT DETECTED NOT DETECTED Final   Parainfluenza Virus 3 NOT DETECTED NOT DETECTED Final   Parainfluenza Virus 4 NOT DETECTED NOT DETECTED Final   Respiratory Syncytial Virus NOT DETECTED NOT DETECTED Final   Bordetella pertussis NOT DETECTED NOT DETECTED Final    Bordetella Parapertussis NOT DETECTED NOT DETECTED Final   Chlamydophila pneumoniae NOT DETECTED NOT DETECTED Final   Mycoplasma pneumoniae NOT DETECTED NOT DETECTED Final    Comment: Performed at [facility], [address] [place], [place] [address]  MRSA Next Gen by PCR, Nasal     Status: None   Collection Time: 03/07/23 12:51 PM   Specimen: Nasal Mucosa; Nasal Swab  Result Value Ref Range Status   MRSA by PCR Next Gen NOT DETECTED NOT DETECTED Final    Comment: (NOTE) The GeneXpert MRSA Assay (FDA approved for NASAL specimens only), is one component of a comprehensive MRSA colonization surveillance program. It is not intended to diagnose MRSA infection nor to guide or monitor treatment for MRSA infections. Test performance is not FDA approved in patients less than 72 years old. Performed at [facility], [address] [place], [place] [address]   SARS Coronavirus 2 by RT PCR (hospital order, performed in [facility] hospital lab) *cepheid single result test*     Status: None   Collection Time: 03/07/23 12:51 PM  Result Value Ref Range Status   SARS Coronavirus 2 by RT PCR NEGATIVE NEGATIVE Final    Comment: (NOTE) SARS-CoV-2 target nucleic acids are NOT DETECTED.  The SARS-CoV-2 RNA is generally detectable in upper and lower respiratory specimens during the acute phase of infection. The lowest concentration of SARS-CoV-2 viral copies this assay can detect is 250 copies / mL. A negative result does not preclude SARS-CoV-2 infection and should not be used as the sole basis for treatment or other patient management decisions.  A negative result may occur with improper specimen collection / handling, submission of specimen other than nasopharyngeal swab, presence of viral mutation(s) within the areas targeted by this assay, and inadequate number of viral copies (<250 copies / mL). A negative result must be combined with clinical observations, patient  history, and epidemiological information.  Fact Sheet for Patients:   RoadLapTop.co.za  Fact Sheet for Healthcare Providers: http://kim-miller.com/  This test is not yet approved or  cleared by the [place] FDA and has been authorized for detection and/or diagnosis of SARS-CoV-2 by FDA under an Emergency Use Authorization (EUA).  This EUA will remain in effect (meaning this test can be used) for the duration of the COVID-19 declaration under Section 564(b)(1) of the Act, 21 U.S.C. section 360bbb-3(b)(1), unless the authorization is terminated or revoked sooner.  Performed at [facility], [address], [place], [place] [address]      Radiology Studies: DG Swallowing Func-Speech Pathology Result Date: 03/09/2023 Table formatting from the original result was not included. Modified Barium Swallow Study Patient Details Name: MYKAELA ARENA MRN:  [id number] Date of Birth: Mar 25, 1960 Today's Date: 03/09/2023 HPI/PMH: HPI: 63yo female admitted 03/06/23 after a fall at home. PMH: leukemia, anxiety, depression, GERD, schizoaffective disorder, schiophrenia, PTSD, insomnia, tobacco abuse, drug-induced Parkinson's dz, asthma, carpal tunner, CKD, chronic low back pain, pancytopenia, nephrolithiasis, HLT, HTN, IBS, neuromuscular d/o, DM with peripheral neuropathy, PONV. Steady decline in mental status in recent months per son. CXR - LLL consolidation. MRI = trace SDH posterior falx and left temporo-occipital region. MBS 03/2018 = no pen/asp, reg/thin. CXR = LLL PNA, right lung clear. Clinical Impression: Clinical Impression: Patient present with minimal pharyngeal dysphagia resulting in trace aspiration of thin liquids - most notably with sequential swallows.  Chin tuck posture decr to trace penetration of thin with straw usage.  Pharyngeal swallow was largely strong without significant retention.     Barium tablet given with nectar thick  easily transited through pharynx and into esophagus.  However it appeared to briefly halt at distal esophagus with nectar barium stasis above it.   Pt notable for referrent sensation to pharynx. SLP provided pudding barium to aid clearance but esophagus was clear upon esophageal sweep.   Suspect potential dysmotlity component, radiologist not present for evaluation and MBS does not diagnose below PES.  Educated pt to findings/recommendations using teach back following exam. The esophagus was normal. Scattered moderate inflammation characterized by erythema and  granularity was found in the cardia and in the gastric body. On prior endoscopy. 2016. Factors that may increase risk of adverse event in presence of aspiration Rubye Oaks & Clearance Coots 2021): Factors that may increase risk of adverse event in presence of aspiration Rubye Oaks & Clearance Coots 2021): Frail or deconditioned Recommendations/Plan: Swallowing Evaluation Recommendations Swallowing Evaluation Recommendations Recommendations: PO diet PO Diet Recommendation: Regular; Thin liquids (Level 0) (for pt to order foods she can mange - advised her she MUST order her own meals) Liquid Administration via: Cup; Straw Medication Administration: Whole meds with liquid (with nectar/Ensure and applesauce) Supervision: Patient able to self-feed Swallowing strategies  : Slow rate; Small bites/sips Postural changes: Stay upright 30-60 min after meals; Position pt fully upright for meals Oral care recommendations: Oral care BID (2x/day) Treatment Plan Treatment Plan Treatment recommendations: Therapy as outlined in treatment plan below Follow-up recommendations: Skilled nursing-short term rehab (<3 hours/day) Functional status assessment: Patient has had a recent decline in their functional status and demonstrates the ability to make significant improvements in function in a reasonable and predictable amount of time. Treatment frequency: Min 1x/week Treatment duration: 1 week  Interventions: Aspiration precaution training; Patient/family education; Compensatory techniques Recommendations Recommendations for follow up therapy are one component of a multi-disciplinary discharge planning process, led by the attending physician.  Recommendations may be updated based on patient status, additional functional criteria and insurance authorization. Assessment: Orofacial Exam: Orofacial Exam Oral Cavity - Dentition: Missing dentition; Other (Comment) (few lower dentition - pt waiting to get upper dentures) Orofacial Anatomy: [facility] Oral Motor/Sensory Function: WFL Anatomy: Anatomy: WFL Boluses Administered: Boluses Administered Boluses Administered: Thin liquids (Level 0); Mildly thick liquids (Level 2, nectar thick); Puree; Solid  Oral Impairment Domain: Oral Impairment Domain Lip Closure: No labial escape Tongue control during bolus hold: Posterior escape of less than half of bolus Bolus preparation/mastication: Slow prolonged chewing/mashing with complete recollection Bolus transport/lingual motion: Brisk tongue motion Oral residue: Trace residue lining oral structures Location of oral residue : Tongue Initiation of pharyngeal swallow : Valleculae; Pyriform sinuses  Pharyngeal Impairment Domain: Pharyngeal Impairment Domain Soft palate elevation: No bolus between soft palate (SP)/pharyngeal wall (  PW) Laryngeal elevation: Partial superior movement of thyroid cartilage/partial approximation of arytenoids to epiglottic petiole Anterior hyoid excursion: Partial anterior movement Epiglottic movement: Complete inversion Laryngeal vestibule closure: Incomplete, narrow column air/contrast in laryngeal vestibule Pharyngeal stripping wave : Present - complete Pharyngeal contraction (A/P view only): N/A Pharyngoesophageal segment opening: Complete distension and complete duration, no obstruction of flow Tongue base retraction: Trace column of contrast or air between tongue base and PPW Pharyngeal residue:  Trace residue within or on pharyngeal structures Location of pharyngeal residue: Valleculae; Pyriform sinuses; Aryepiglottic folds  Esophageal Impairment Domain: Esophageal Impairment Domain Esophageal clearance upright position: Esophageal retention (Barium tablet taken with nectar appeared to briefly halt at distal esophagus with nectar barium stasis above it - pt having referrent sensation to pharynx.  SLP provided pudding barium to aid clearance but esophagus was clear upon esophageal sweep) Pill: Pill Consistency administered: Mildly thick liquids (Level 2, nectar thick) Mildly thick liquids (Level 2, nectar thick): WFL Penetration/Aspiration Scale Score: Penetration/Aspiration Scale Score 1.  Material does not enter airway: Mildly thick liquids (Level 2, nectar thick); Puree; Solid; Pill 3.  Material enters airway, remains ABOVE vocal cords and not ejected out: Thin liquids (Level 0) 8.  Material enters airway, passes BELOW cords without attempt by patient to eject out (silent aspiration) : Thin liquids (Level 0) Compensatory Strategies: Compensatory Strategies Compensatory strategies: Yes Chin tuck: Ineffective; Effective Effective Chin Tuck: Thin liquid (Level 0) Ineffective Chin Tuck: Thin liquid (Level 0) Other(comment): Effective (single boluses with liquids effective)   General Information: Caregiver present: No  Diet Prior to this Study: Regular; Thin liquids (Level 0)   Temperature : Normal   Respiratory Status: WFL   Supplemental O2: Nasal cannula   History of Recent Intubation: No  Behavior/Cognition: Alert; Cooperative; Pleasant mood Self-Feeding Abilities: Able to self-feed Baseline vocal quality/speech: Normal Volitional Cough: Able to elicit Volitional Swallow: Unable to elicit Exam Limitations: No limitations Goal Planning: Prognosis for improved oropharyngeal function: Fair Barriers to Reach Goals: Other (Comment); Overall medical prognosis (medical diagnosis) No data recorded Patient/Family  Stated Goal: none stated Consulted and agree with results and recommendations: Patient; Nurse Pain: Pain Assessment Pain Assessment: Faces Pain Score: 9 Faces Pain Scale: 4 Pain Location: L LE, back Pain Descriptors / Indicators: Constant Pain Intervention(s): Limited activity within patient's tolerance; Monitored during session; Repositioned End of Session: Start Time:SLP Start Time (ACUTE ONLY): 1107 Stop Time: SLP Stop Time (ACUTE ONLY): 1145 Time Calculation:SLP Time Calculation (min) (ACUTE ONLY): 38 min Charges: SLP Evaluations $ SLP Speech Visit: 1 Visit SLP Evaluations $BSS Swallow: 1 Procedure $MBS Swallow: 1 Procedure $Swallowing Treatment: 1 Procedure SLP visit diagnosis: SLP Visit Diagnosis: Dysphagia, pharyngeal phase (R13.13); Dysphagia, unspecified (R13.10) Past Medical History: Past Medical History: Diagnosis Date  Anxiety   Asthma   Chronic kidney disease   Chronic pain   previously saw Dr. Shireen Quan in pain clinic, then saw pain specialist in [facility]  Depression   Diabetes mellitus (HCC)   Frequency of urination   GERD (gastroesophageal reflux disease)   Headache(784.0)   High cholesterol   History of kidney stones   Hypertension   IBS (irritable bowel syndrome)   Left ankle instability   Left knee DJD   Lumbar Degenerative Disc Disease of  10/11/2014  Neuromuscular disorder (HCC)   Osteoarthritis of hip (Right) 05/05/2015  Other enthesopathy of ankle and tarsus 12/15/2009  Qualifier: Diagnosis of  By: Darrick Penna MD, KARL    Parkinson's disease (HCC)   Peripheral sensory neuropathy (Bilateral) 11/19/2014  Postoperative nausea and vomiting   Schizophrenia (HCC)  Past Surgical History: Past Surgical History: Procedure Laterality Date  ABDOMINAL HYSTERECTOMY    ANKLE SURGERY Left   x 2  APPENDECTOMY    CARDIAC CATHETERIZATION    COLONOSCOPY  2013  COLONOSCOPY WITH PROPOFOL N/A 04/25/2017  Procedure: COLONOSCOPY WITH PROPOFOL;  Surgeon: Scot Jun, MD;  Location: [facility] ENDOSCOPY;  Service: Endoscopy;   Laterality: N/A;  COLONOSCOPY WITH PROPOFOL N/A 05/25/2022  Procedure: COLONOSCOPY WITH PROPOFOL;  Surgeon: [name], MD;  Location: [facility] ENDOSCOPY;  Service: Endoscopy;  Laterality: N/A;  CYSTOSCOPY/URETEROSCOPY/HOLMIUM LASER/STENT PLACEMENT Left 09/11/2019  Procedure: CYSTOSCOPY/URETEROSCOPY/HOLMIUM LASER/STENT PLACEMENT;  Surgeon: Riki Altes, MD;  Location: [facility] ORS;  Service: Urology;  Laterality: Left;  ESOPHAGOGASTRODUODENOSCOPY (EGD) WITH PROPOFOL N/A 10/03/2014  Procedure: ESOPHAGOGASTRODUODENOSCOPY (EGD) WITH PROPOFOL;  Surgeon: Elnita Maxwell, MD;  Location: [facility] ENDOSCOPY;  Service: Endoscopy;  Laterality: N/A;  ESOPHAGOGASTRODUODENOSCOPY (EGD) WITH PROPOFOL  04/25/2017  Procedure: ESOPHAGOGASTRODUODENOSCOPY (EGD) WITH PROPOFOL;  Surgeon: Scot Jun, MD;  Location: [facility] ENDOSCOPY;  Service: Endoscopy;;  ESOPHAGOGASTRODUODENOSCOPY (EGD) WITH PROPOFOL N/A 05/25/2022  Procedure: ESOPHAGOGASTRODUODENOSCOPY (EGD) WITH PROPOFOL;  Surgeon: [name], MD;  Location: [facility] ENDOSCOPY;  Service: Endoscopy;  Laterality: N/A;  IR BONE MARROW BIOPSY & ASPIRATION  01/03/2023  JOINT REPLACEMENT Left   knee  KNEE ARTHROSCOPY  1997  left knee  LEFT HEART CATH AND CORONARY ANGIOGRAPHY Left 11/10/2017  Procedure: LEFT HEART CATH AND CORONARY ANGIOGRAPHY;  Surgeon: Marcina Millard, MD;  Location: [facility] INVASIVE CV LAB;  Service: Cardiovascular;  Laterality: Left;  LEFT HEART CATHETERIZATION WITH CORONARY ANGIOGRAM N/A 01/11/2013  Procedure: LEFT HEART CATHETERIZATION WITH CORONARY ANGIOGRAM;  Surgeon: [name], MD;  Location: [facility] CATH LAB;  Service: Cardiovascular;  Laterality: N/A;  TOTAL KNEE ARTHROPLASTY  08/30/2011  Procedure: TOTAL KNEE ARTHROPLASTY;  Surgeon: Nilda Simmer, MD;  Location: [facility] OR;  Service: Orthopedics;  Laterality: Left; Rolena Infante, MS [facility] SLP Acute Rehab Services Office [phone] Chales Abrahams 03/09/2023, 12:04 PM    Pamella Pert, MD,  PhD [facility]  Between 7 am - 7 pm I am available, please contact me via Amion (for emergencies) or Securechat (non urgent messages)  Between 7 pm - 7 am I am not available, please contact night coverage MD/APP via Amion

## 2023-03-10 NOTE — Progress Notes (Signed)
Patient woke up confused this morning, disoriented to place, time, and situation. She removed the pulse ox monitor, the tele monitor, and her nasal cannula. Patient was reoriented back appropriately. She requested assistance to call her children. Patient was able to speak with her children as requested.

## 2023-03-10 NOTE — Plan of Care (Incomplete)
  Problem: Education: Goal: Ability to describe self-care measures that may prevent or decrease complications (Diabetes Survival Skills Education) will improve Outcome: Progressing   Problem: Coping: Goal: Ability to adjust to condition or change in health will improve Outcome: Progressing   Problem: Fluid Volume: Goal: Ability to maintain a balanced intake and output will improve Outcome: Progressing   Problem: Health Behavior/Discharge Planning: Goal: Ability to identify and utilize available resources and services will improve Outcome: Progressing Goal: Ability to manage health-related needs will improve Outcome: Progressing   Problem: Metabolic: Goal: Ability to maintain appropriate glucose levels will improve Outcome: Progressing   Problem: Nutritional: Goal: Maintenance of adequate nutrition will improve Outcome: Progressing Goal: Progress toward achieving an optimal weight will improve Outcome: Progressing   Problem: Tissue Perfusion: Goal: Adequacy of tissue perfusion will improve Outcome: Progressing   Problem: Education: Goal: Knowledge of General Education information will improve Description: Including pain rating scale, medication(s)/side effects and non-pharmacologic comfort measures Outcome: Progressing   Problem: Clinical Measurements: Goal: Diagnostic test results will improve Outcome: Progressing Goal: Respiratory complications will improve Outcome: Progressing Goal: Cardiovascular complication will be avoided Outcome: Progressing   Problem: Activity: Goal: Risk for activity intolerance will decrease Outcome: Progressing   Problem: Nutrition: Goal: Adequate nutrition will be maintained Outcome: Progressing   Problem: Coping: Goal: Level of anxiety will decrease Outcome: Progressing   Problem: Pain Managment: Goal: General experience of comfort will improve and/or be controlled Outcome: Progressing   Problem: Safety: Goal: Ability to  remain free from injury will improve Outcome: Progressing   Problem: Skin Integrity: Goal: Risk for impaired skin integrity will decrease Outcome: Progressing   Problem: Activity: Goal: Ability to tolerate increased activity will improve Outcome: Progressing   Problem: Respiratory: Goal: Ability to maintain adequate ventilation will improve Outcome: Progressing   Problem: Skin Integrity: Goal: Risk for impaired skin integrity will decrease Outcome: Adequate for Discharge

## 2023-03-11 DIAGNOSIS — C9201 Acute myeloblastic leukemia, in remission: Secondary | ICD-10-CM | POA: Diagnosis not present

## 2023-03-11 DIAGNOSIS — R109 Unspecified abdominal pain: Secondary | ICD-10-CM | POA: Diagnosis not present

## 2023-03-11 DIAGNOSIS — F05 Delirium due to known physiological condition: Secondary | ICD-10-CM | POA: Diagnosis not present

## 2023-03-11 DIAGNOSIS — M25552 Pain in left hip: Secondary | ICD-10-CM | POA: Diagnosis not present

## 2023-03-11 DIAGNOSIS — S0003XA Contusion of scalp, initial encounter: Secondary | ICD-10-CM | POA: Diagnosis not present

## 2023-03-11 DIAGNOSIS — E440 Moderate protein-calorie malnutrition: Secondary | ICD-10-CM | POA: Diagnosis not present

## 2023-03-11 DIAGNOSIS — Z515 Encounter for palliative care: Secondary | ICD-10-CM | POA: Diagnosis not present

## 2023-03-11 DIAGNOSIS — F419 Anxiety disorder, unspecified: Secondary | ICD-10-CM | POA: Diagnosis not present

## 2023-03-11 DIAGNOSIS — R1010 Upper abdominal pain, unspecified: Secondary | ICD-10-CM | POA: Diagnosis not present

## 2023-03-11 DIAGNOSIS — S065XAA Traumatic subdural hemorrhage with loss of consciousness status unknown, initial encounter: Secondary | ICD-10-CM | POA: Insufficient documentation

## 2023-03-11 DIAGNOSIS — M79669 Pain in unspecified lower leg: Secondary | ICD-10-CM | POA: Diagnosis not present

## 2023-03-11 DIAGNOSIS — D709 Neutropenia, unspecified: Secondary | ICD-10-CM | POA: Diagnosis not present

## 2023-03-11 DIAGNOSIS — R55 Syncope and collapse: Secondary | ICD-10-CM | POA: Diagnosis not present

## 2023-03-11 DIAGNOSIS — I82531 Chronic embolism and thrombosis of right popliteal vein: Secondary | ICD-10-CM | POA: Diagnosis not present

## 2023-03-11 DIAGNOSIS — G2401 Drug induced subacute dyskinesia: Secondary | ICD-10-CM | POA: Diagnosis not present

## 2023-03-11 DIAGNOSIS — D61818 Other pancytopenia: Secondary | ICD-10-CM | POA: Diagnosis not present

## 2023-03-11 DIAGNOSIS — R5081 Fever presenting with conditions classified elsewhere: Secondary | ICD-10-CM | POA: Diagnosis not present

## 2023-03-11 DIAGNOSIS — R918 Other nonspecific abnormal finding of lung field: Secondary | ICD-10-CM | POA: Diagnosis not present

## 2023-03-11 DIAGNOSIS — R140 Abdominal distension (gaseous): Secondary | ICD-10-CM | POA: Diagnosis not present

## 2023-03-11 DIAGNOSIS — R4182 Altered mental status, unspecified: Secondary | ICD-10-CM | POA: Diagnosis not present

## 2023-03-11 DIAGNOSIS — K5900 Constipation, unspecified: Secondary | ICD-10-CM | POA: Diagnosis not present

## 2023-03-11 DIAGNOSIS — Z8659 Personal history of other mental and behavioral disorders: Secondary | ICD-10-CM | POA: Diagnosis not present

## 2023-03-11 DIAGNOSIS — R400 Somnolence: Secondary | ICD-10-CM | POA: Diagnosis not present

## 2023-03-11 DIAGNOSIS — E871 Hypo-osmolality and hyponatremia: Secondary | ICD-10-CM | POA: Diagnosis not present

## 2023-03-11 DIAGNOSIS — T451X5A Adverse effect of antineoplastic and immunosuppressive drugs, initial encounter: Secondary | ICD-10-CM | POA: Diagnosis not present

## 2023-03-11 DIAGNOSIS — B3731 Acute candidiasis of vulva and vagina: Secondary | ICD-10-CM | POA: Diagnosis not present

## 2023-03-11 DIAGNOSIS — J189 Pneumonia, unspecified organism: Secondary | ICD-10-CM | POA: Diagnosis not present

## 2023-03-11 DIAGNOSIS — Z5181 Encounter for therapeutic drug level monitoring: Secondary | ICD-10-CM | POA: Diagnosis not present

## 2023-03-11 DIAGNOSIS — H8110 Benign paroxysmal vertigo, unspecified ear: Secondary | ICD-10-CM | POA: Diagnosis not present

## 2023-03-11 DIAGNOSIS — M25579 Pain in unspecified ankle and joints of unspecified foot: Secondary | ICD-10-CM | POA: Diagnosis not present

## 2023-03-11 DIAGNOSIS — R519 Headache, unspecified: Secondary | ICD-10-CM | POA: Diagnosis not present

## 2023-03-11 DIAGNOSIS — Z79899 Other long term (current) drug therapy: Secondary | ICD-10-CM | POA: Diagnosis not present

## 2023-03-11 DIAGNOSIS — H81399 Other peripheral vertigo, unspecified ear: Secondary | ICD-10-CM | POA: Diagnosis not present

## 2023-03-11 DIAGNOSIS — G8929 Other chronic pain: Secondary | ICD-10-CM | POA: Diagnosis not present

## 2023-03-11 DIAGNOSIS — G253 Myoclonus: Secondary | ICD-10-CM | POA: Diagnosis not present

## 2023-03-11 DIAGNOSIS — R059 Cough, unspecified: Secondary | ICD-10-CM | POA: Diagnosis not present

## 2023-03-11 DIAGNOSIS — R9401 Abnormal electroencephalogram [EEG]: Secondary | ICD-10-CM | POA: Diagnosis not present

## 2023-03-11 DIAGNOSIS — Z8679 Personal history of other diseases of the circulatory system: Secondary | ICD-10-CM | POA: Diagnosis not present

## 2023-03-11 DIAGNOSIS — D61810 Antineoplastic chemotherapy induced pancytopenia: Secondary | ICD-10-CM | POA: Diagnosis not present

## 2023-03-11 DIAGNOSIS — D84821 Immunodeficiency due to drugs: Secondary | ICD-10-CM | POA: Diagnosis not present

## 2023-03-11 DIAGNOSIS — I6200 Nontraumatic subdural hemorrhage, unspecified: Secondary | ICD-10-CM | POA: Diagnosis not present

## 2023-03-11 DIAGNOSIS — N189 Chronic kidney disease, unspecified: Secondary | ICD-10-CM | POA: Diagnosis not present

## 2023-03-11 DIAGNOSIS — D849 Immunodeficiency, unspecified: Secondary | ICD-10-CM | POA: Diagnosis not present

## 2023-03-11 DIAGNOSIS — R300 Dysuria: Secondary | ICD-10-CM | POA: Diagnosis not present

## 2023-03-11 DIAGNOSIS — M25572 Pain in left ankle and joints of left foot: Secondary | ICD-10-CM | POA: Diagnosis not present

## 2023-03-11 DIAGNOSIS — C9200 Acute myeloblastic leukemia, not having achieved remission: Secondary | ICD-10-CM | POA: Diagnosis not present

## 2023-03-11 DIAGNOSIS — G4700 Insomnia, unspecified: Secondary | ICD-10-CM | POA: Diagnosis not present

## 2023-03-11 DIAGNOSIS — G928 Other toxic encephalopathy: Secondary | ICD-10-CM | POA: Diagnosis not present

## 2023-03-11 DIAGNOSIS — H814 Vertigo of central origin: Secondary | ICD-10-CM | POA: Diagnosis not present

## 2023-03-11 DIAGNOSIS — R42 Dizziness and giddiness: Secondary | ICD-10-CM | POA: Diagnosis not present

## 2023-03-11 DIAGNOSIS — R451 Restlessness and agitation: Secondary | ICD-10-CM | POA: Diagnosis not present

## 2023-03-11 DIAGNOSIS — R410 Disorientation, unspecified: Secondary | ICD-10-CM | POA: Diagnosis not present

## 2023-03-11 DIAGNOSIS — I4711 Inappropriate sinus tachycardia, so stated: Secondary | ICD-10-CM | POA: Diagnosis not present

## 2023-03-11 DIAGNOSIS — Z9981 Dependence on supplemental oxygen: Secondary | ICD-10-CM | POA: Diagnosis not present

## 2023-03-11 DIAGNOSIS — W1830XA Fall on same level, unspecified, initial encounter: Secondary | ICD-10-CM | POA: Diagnosis not present

## 2023-03-11 DIAGNOSIS — F259 Schizoaffective disorder, unspecified: Secondary | ICD-10-CM | POA: Diagnosis not present

## 2023-03-11 DIAGNOSIS — G2581 Restless legs syndrome: Secondary | ICD-10-CM | POA: Diagnosis not present

## 2023-03-11 NOTE — Progress Notes (Addendum)
[facility] arrived to transport the pt to [facility] oncology. Patient left in stable condition. [facility] is aware of pt's ETA. Pt's son also updated.

## 2023-03-12 LAB — CULTURE, BLOOD (ROUTINE X 2)
Culture: NO GROWTH
Culture: NO GROWTH

## 2023-03-21 ENCOUNTER — Telehealth: Payer: Self-pay

## 2023-03-21 NOTE — Telephone Encounter (Signed)
 Noted.

## 2023-03-21 NOTE — Telephone Encounter (Signed)
 pt left a message that she hasleukemia and is at [facility] for treatment. she doesn;t know when she will be able to make an appt. pt was last seen on 9-18 next appt 2-11 (which will be canceled )

## 2023-03-22 ENCOUNTER — Telehealth: Payer: Self-pay | Admitting: Psychiatry

## 2023-03-27 DIAGNOSIS — H8111 Benign paroxysmal vertigo, right ear: Secondary | ICD-10-CM | POA: Insufficient documentation

## 2023-04-01 DIAGNOSIS — E1122 Type 2 diabetes mellitus with diabetic chronic kidney disease: Secondary | ICD-10-CM | POA: Diagnosis not present

## 2023-04-01 DIAGNOSIS — J189 Pneumonia, unspecified organism: Secondary | ICD-10-CM | POA: Diagnosis not present

## 2023-04-01 DIAGNOSIS — F333 Major depressive disorder, recurrent, severe with psychotic symptoms: Secondary | ICD-10-CM | POA: Diagnosis not present

## 2023-04-01 DIAGNOSIS — I129 Hypertensive chronic kidney disease with stage 1 through stage 4 chronic kidney disease, or unspecified chronic kidney disease: Secondary | ICD-10-CM | POA: Diagnosis not present

## 2023-04-01 DIAGNOSIS — E1165 Type 2 diabetes mellitus with hyperglycemia: Secondary | ICD-10-CM | POA: Diagnosis not present

## 2023-04-01 DIAGNOSIS — N182 Chronic kidney disease, stage 2 (mild): Secondary | ICD-10-CM | POA: Diagnosis not present

## 2023-04-01 DIAGNOSIS — S065X0D Traumatic subdural hemorrhage without loss of consciousness, subsequent encounter: Secondary | ICD-10-CM | POA: Diagnosis not present

## 2023-04-01 DIAGNOSIS — C9201 Acute myeloblastic leukemia, in remission: Secondary | ICD-10-CM | POA: Diagnosis not present

## 2023-04-01 DIAGNOSIS — J4540 Moderate persistent asthma, uncomplicated: Secondary | ICD-10-CM | POA: Diagnosis not present

## 2023-04-02 DIAGNOSIS — C9200 Acute myeloblastic leukemia, not having achieved remission: Secondary | ICD-10-CM | POA: Diagnosis not present

## 2023-04-04 DIAGNOSIS — Z09 Encounter for follow-up examination after completed treatment for conditions other than malignant neoplasm: Secondary | ICD-10-CM | POA: Diagnosis not present

## 2023-04-04 DIAGNOSIS — E1165 Type 2 diabetes mellitus with hyperglycemia: Secondary | ICD-10-CM | POA: Diagnosis not present

## 2023-04-04 DIAGNOSIS — C9200 Acute myeloblastic leukemia, not having achieved remission: Secondary | ICD-10-CM | POA: Diagnosis not present

## 2023-04-04 DIAGNOSIS — J4530 Mild persistent asthma, uncomplicated: Secondary | ICD-10-CM | POA: Diagnosis not present

## 2023-04-04 DIAGNOSIS — F209 Schizophrenia, unspecified: Secondary | ICD-10-CM | POA: Diagnosis not present

## 2023-04-04 DIAGNOSIS — G2119 Other drug induced secondary parkinsonism: Secondary | ICD-10-CM | POA: Diagnosis not present

## 2023-04-04 DIAGNOSIS — Z794 Long term (current) use of insulin: Secondary | ICD-10-CM | POA: Diagnosis not present

## 2023-04-06 DIAGNOSIS — J4540 Moderate persistent asthma, uncomplicated: Secondary | ICD-10-CM | POA: Diagnosis not present

## 2023-04-06 DIAGNOSIS — N182 Chronic kidney disease, stage 2 (mild): Secondary | ICD-10-CM | POA: Diagnosis not present

## 2023-04-06 DIAGNOSIS — E1165 Type 2 diabetes mellitus with hyperglycemia: Secondary | ICD-10-CM | POA: Diagnosis not present

## 2023-04-06 DIAGNOSIS — C9201 Acute myeloblastic leukemia, in remission: Secondary | ICD-10-CM | POA: Diagnosis not present

## 2023-04-06 DIAGNOSIS — E1122 Type 2 diabetes mellitus with diabetic chronic kidney disease: Secondary | ICD-10-CM | POA: Diagnosis not present

## 2023-04-06 DIAGNOSIS — I129 Hypertensive chronic kidney disease with stage 1 through stage 4 chronic kidney disease, or unspecified chronic kidney disease: Secondary | ICD-10-CM | POA: Diagnosis not present

## 2023-04-06 DIAGNOSIS — S065X0D Traumatic subdural hemorrhage without loss of consciousness, subsequent encounter: Secondary | ICD-10-CM | POA: Diagnosis not present

## 2023-04-06 DIAGNOSIS — F333 Major depressive disorder, recurrent, severe with psychotic symptoms: Secondary | ICD-10-CM | POA: Diagnosis not present

## 2023-04-06 DIAGNOSIS — J189 Pneumonia, unspecified organism: Secondary | ICD-10-CM | POA: Diagnosis not present

## 2023-04-07 DIAGNOSIS — C9201 Acute myeloblastic leukemia, in remission: Secondary | ICD-10-CM | POA: Diagnosis not present

## 2023-04-07 DIAGNOSIS — F333 Major depressive disorder, recurrent, severe with psychotic symptoms: Secondary | ICD-10-CM | POA: Diagnosis not present

## 2023-04-11 DIAGNOSIS — E782 Mixed hyperlipidemia: Secondary | ICD-10-CM | POA: Diagnosis not present

## 2023-04-11 DIAGNOSIS — J189 Pneumonia, unspecified organism: Secondary | ICD-10-CM | POA: Diagnosis not present

## 2023-04-11 DIAGNOSIS — Z9289 Personal history of other medical treatment: Secondary | ICD-10-CM | POA: Diagnosis not present

## 2023-04-11 DIAGNOSIS — E1122 Type 2 diabetes mellitus with diabetic chronic kidney disease: Secondary | ICD-10-CM | POA: Diagnosis not present

## 2023-04-11 DIAGNOSIS — E1159 Type 2 diabetes mellitus with other circulatory complications: Secondary | ICD-10-CM | POA: Diagnosis not present

## 2023-04-11 DIAGNOSIS — F333 Major depressive disorder, recurrent, severe with psychotic symptoms: Secondary | ICD-10-CM | POA: Diagnosis not present

## 2023-04-11 DIAGNOSIS — E1169 Type 2 diabetes mellitus with other specified complication: Secondary | ICD-10-CM | POA: Diagnosis not present

## 2023-04-11 DIAGNOSIS — I129 Hypertensive chronic kidney disease with stage 1 through stage 4 chronic kidney disease, or unspecified chronic kidney disease: Secondary | ICD-10-CM | POA: Diagnosis not present

## 2023-04-11 DIAGNOSIS — I10 Essential (primary) hypertension: Secondary | ICD-10-CM | POA: Diagnosis not present

## 2023-04-11 DIAGNOSIS — E669 Obesity, unspecified: Secondary | ICD-10-CM | POA: Diagnosis not present

## 2023-04-11 DIAGNOSIS — C9200 Acute myeloblastic leukemia, not having achieved remission: Secondary | ICD-10-CM | POA: Diagnosis not present

## 2023-04-11 DIAGNOSIS — S065X0D Traumatic subdural hemorrhage without loss of consciousness, subsequent encounter: Secondary | ICD-10-CM | POA: Diagnosis not present

## 2023-04-11 DIAGNOSIS — N182 Chronic kidney disease, stage 2 (mild): Secondary | ICD-10-CM | POA: Diagnosis not present

## 2023-04-11 DIAGNOSIS — C9201 Acute myeloblastic leukemia, in remission: Secondary | ICD-10-CM | POA: Diagnosis not present

## 2023-04-11 DIAGNOSIS — J4540 Moderate persistent asthma, uncomplicated: Secondary | ICD-10-CM | POA: Diagnosis not present

## 2023-04-11 DIAGNOSIS — E1165 Type 2 diabetes mellitus with hyperglycemia: Secondary | ICD-10-CM | POA: Diagnosis not present

## 2023-04-12 DIAGNOSIS — S065X0D Traumatic subdural hemorrhage without loss of consciousness, subsequent encounter: Secondary | ICD-10-CM | POA: Diagnosis not present

## 2023-04-12 DIAGNOSIS — E1122 Type 2 diabetes mellitus with diabetic chronic kidney disease: Secondary | ICD-10-CM | POA: Diagnosis not present

## 2023-04-12 DIAGNOSIS — I129 Hypertensive chronic kidney disease with stage 1 through stage 4 chronic kidney disease, or unspecified chronic kidney disease: Secondary | ICD-10-CM | POA: Diagnosis not present

## 2023-04-12 DIAGNOSIS — N182 Chronic kidney disease, stage 2 (mild): Secondary | ICD-10-CM | POA: Diagnosis not present

## 2023-04-12 DIAGNOSIS — F333 Major depressive disorder, recurrent, severe with psychotic symptoms: Secondary | ICD-10-CM | POA: Diagnosis not present

## 2023-04-12 DIAGNOSIS — J4540 Moderate persistent asthma, uncomplicated: Secondary | ICD-10-CM | POA: Diagnosis not present

## 2023-04-12 DIAGNOSIS — J189 Pneumonia, unspecified organism: Secondary | ICD-10-CM | POA: Diagnosis not present

## 2023-04-12 DIAGNOSIS — E1165 Type 2 diabetes mellitus with hyperglycemia: Secondary | ICD-10-CM | POA: Diagnosis not present

## 2023-04-12 DIAGNOSIS — C9201 Acute myeloblastic leukemia, in remission: Secondary | ICD-10-CM | POA: Diagnosis not present

## 2023-04-13 DIAGNOSIS — R9431 Abnormal electrocardiogram [ECG] [EKG]: Secondary | ICD-10-CM | POA: Diagnosis not present

## 2023-04-13 DIAGNOSIS — R600 Localized edema: Secondary | ICD-10-CM | POA: Diagnosis not present

## 2023-04-13 DIAGNOSIS — R0789 Other chest pain: Secondary | ICD-10-CM | POA: Diagnosis not present

## 2023-04-13 DIAGNOSIS — C9200 Acute myeloblastic leukemia, not having achieved remission: Secondary | ICD-10-CM | POA: Diagnosis not present

## 2023-04-13 DIAGNOSIS — R000 Tachycardia, unspecified: Secondary | ICD-10-CM | POA: Diagnosis not present

## 2023-04-14 DIAGNOSIS — C9200 Acute myeloblastic leukemia, not having achieved remission: Secondary | ICD-10-CM | POA: Diagnosis not present

## 2023-04-14 DIAGNOSIS — J189 Pneumonia, unspecified organism: Secondary | ICD-10-CM | POA: Diagnosis not present

## 2023-04-14 DIAGNOSIS — R000 Tachycardia, unspecified: Secondary | ICD-10-CM | POA: Diagnosis not present

## 2023-04-14 DIAGNOSIS — R0789 Other chest pain: Secondary | ICD-10-CM | POA: Diagnosis not present

## 2023-04-14 DIAGNOSIS — I129 Hypertensive chronic kidney disease with stage 1 through stage 4 chronic kidney disease, or unspecified chronic kidney disease: Secondary | ICD-10-CM | POA: Diagnosis not present

## 2023-04-14 DIAGNOSIS — S065X0D Traumatic subdural hemorrhage without loss of consciousness, subsequent encounter: Secondary | ICD-10-CM | POA: Diagnosis not present

## 2023-04-14 DIAGNOSIS — E1122 Type 2 diabetes mellitus with diabetic chronic kidney disease: Secondary | ICD-10-CM | POA: Diagnosis not present

## 2023-04-14 DIAGNOSIS — J4540 Moderate persistent asthma, uncomplicated: Secondary | ICD-10-CM | POA: Diagnosis not present

## 2023-04-14 DIAGNOSIS — E1165 Type 2 diabetes mellitus with hyperglycemia: Secondary | ICD-10-CM | POA: Diagnosis not present

## 2023-04-14 DIAGNOSIS — F333 Major depressive disorder, recurrent, severe with psychotic symptoms: Secondary | ICD-10-CM | POA: Diagnosis not present

## 2023-04-14 DIAGNOSIS — C9201 Acute myeloblastic leukemia, in remission: Secondary | ICD-10-CM | POA: Diagnosis not present

## 2023-04-14 DIAGNOSIS — N182 Chronic kidney disease, stage 2 (mild): Secondary | ICD-10-CM | POA: Diagnosis not present

## 2023-04-15 DIAGNOSIS — J189 Pneumonia, unspecified organism: Secondary | ICD-10-CM | POA: Diagnosis not present

## 2023-04-15 DIAGNOSIS — I129 Hypertensive chronic kidney disease with stage 1 through stage 4 chronic kidney disease, or unspecified chronic kidney disease: Secondary | ICD-10-CM | POA: Diagnosis not present

## 2023-04-15 DIAGNOSIS — F333 Major depressive disorder, recurrent, severe with psychotic symptoms: Secondary | ICD-10-CM | POA: Diagnosis not present

## 2023-04-15 DIAGNOSIS — C9201 Acute myeloblastic leukemia, in remission: Secondary | ICD-10-CM | POA: Diagnosis not present

## 2023-04-15 DIAGNOSIS — E1122 Type 2 diabetes mellitus with diabetic chronic kidney disease: Secondary | ICD-10-CM | POA: Diagnosis not present

## 2023-04-15 DIAGNOSIS — N182 Chronic kidney disease, stage 2 (mild): Secondary | ICD-10-CM | POA: Diagnosis not present

## 2023-04-15 DIAGNOSIS — J4540 Moderate persistent asthma, uncomplicated: Secondary | ICD-10-CM | POA: Diagnosis not present

## 2023-04-15 DIAGNOSIS — S065X0D Traumatic subdural hemorrhage without loss of consciousness, subsequent encounter: Secondary | ICD-10-CM | POA: Diagnosis not present

## 2023-04-15 DIAGNOSIS — E1165 Type 2 diabetes mellitus with hyperglycemia: Secondary | ICD-10-CM | POA: Diagnosis not present

## 2023-04-18 DIAGNOSIS — J4540 Moderate persistent asthma, uncomplicated: Secondary | ICD-10-CM | POA: Diagnosis not present

## 2023-04-18 DIAGNOSIS — S065X0D Traumatic subdural hemorrhage without loss of consciousness, subsequent encounter: Secondary | ICD-10-CM | POA: Diagnosis not present

## 2023-04-18 DIAGNOSIS — J189 Pneumonia, unspecified organism: Secondary | ICD-10-CM | POA: Diagnosis not present

## 2023-04-18 DIAGNOSIS — F333 Major depressive disorder, recurrent, severe with psychotic symptoms: Secondary | ICD-10-CM | POA: Diagnosis not present

## 2023-04-18 DIAGNOSIS — I129 Hypertensive chronic kidney disease with stage 1 through stage 4 chronic kidney disease, or unspecified chronic kidney disease: Secondary | ICD-10-CM | POA: Diagnosis not present

## 2023-04-18 DIAGNOSIS — C9201 Acute myeloblastic leukemia, in remission: Secondary | ICD-10-CM | POA: Diagnosis not present

## 2023-04-18 DIAGNOSIS — E1122 Type 2 diabetes mellitus with diabetic chronic kidney disease: Secondary | ICD-10-CM | POA: Diagnosis not present

## 2023-04-18 DIAGNOSIS — E1165 Type 2 diabetes mellitus with hyperglycemia: Secondary | ICD-10-CM | POA: Diagnosis not present

## 2023-04-18 DIAGNOSIS — N182 Chronic kidney disease, stage 2 (mild): Secondary | ICD-10-CM | POA: Diagnosis not present

## 2023-04-20 DIAGNOSIS — J189 Pneumonia, unspecified organism: Secondary | ICD-10-CM | POA: Diagnosis not present

## 2023-04-20 DIAGNOSIS — Z880 Allergy status to penicillin: Secondary | ICD-10-CM | POA: Diagnosis not present

## 2023-04-20 DIAGNOSIS — Z79899 Other long term (current) drug therapy: Secondary | ICD-10-CM | POA: Diagnosis not present

## 2023-04-20 DIAGNOSIS — Z79890 Hormone replacement therapy (postmenopausal): Secondary | ICD-10-CM | POA: Diagnosis not present

## 2023-04-20 DIAGNOSIS — I129 Hypertensive chronic kidney disease with stage 1 through stage 4 chronic kidney disease, or unspecified chronic kidney disease: Secondary | ICD-10-CM | POA: Diagnosis not present

## 2023-04-20 DIAGNOSIS — E1165 Type 2 diabetes mellitus with hyperglycemia: Secondary | ICD-10-CM | POA: Diagnosis not present

## 2023-04-20 DIAGNOSIS — C9200 Acute myeloblastic leukemia, not having achieved remission: Secondary | ICD-10-CM | POA: Diagnosis not present

## 2023-04-20 DIAGNOSIS — S065X0D Traumatic subdural hemorrhage without loss of consciousness, subsequent encounter: Secondary | ICD-10-CM | POA: Diagnosis not present

## 2023-04-20 DIAGNOSIS — J4540 Moderate persistent asthma, uncomplicated: Secondary | ICD-10-CM | POA: Diagnosis not present

## 2023-04-20 DIAGNOSIS — Z006 Encounter for examination for normal comparison and control in clinical research program: Secondary | ICD-10-CM | POA: Diagnosis not present

## 2023-04-20 DIAGNOSIS — I2510 Atherosclerotic heart disease of native coronary artery without angina pectoris: Secondary | ICD-10-CM | POA: Diagnosis not present

## 2023-04-20 DIAGNOSIS — E1122 Type 2 diabetes mellitus with diabetic chronic kidney disease: Secondary | ICD-10-CM | POA: Diagnosis not present

## 2023-04-20 DIAGNOSIS — N182 Chronic kidney disease, stage 2 (mild): Secondary | ICD-10-CM | POA: Diagnosis not present

## 2023-04-20 DIAGNOSIS — C9201 Acute myeloblastic leukemia, in remission: Secondary | ICD-10-CM | POA: Diagnosis not present

## 2023-04-20 DIAGNOSIS — G20A1 Parkinson's disease without dyskinesia, without mention of fluctuations: Secondary | ICD-10-CM | POA: Diagnosis not present

## 2023-04-20 DIAGNOSIS — F333 Major depressive disorder, recurrent, severe with psychotic symptoms: Secondary | ICD-10-CM | POA: Diagnosis not present

## 2023-04-26 DIAGNOSIS — E1122 Type 2 diabetes mellitus with diabetic chronic kidney disease: Secondary | ICD-10-CM | POA: Diagnosis not present

## 2023-04-26 DIAGNOSIS — E1165 Type 2 diabetes mellitus with hyperglycemia: Secondary | ICD-10-CM | POA: Diagnosis not present

## 2023-04-26 DIAGNOSIS — J4540 Moderate persistent asthma, uncomplicated: Secondary | ICD-10-CM | POA: Diagnosis not present

## 2023-04-26 DIAGNOSIS — J189 Pneumonia, unspecified organism: Secondary | ICD-10-CM | POA: Diagnosis not present

## 2023-04-26 DIAGNOSIS — I129 Hypertensive chronic kidney disease with stage 1 through stage 4 chronic kidney disease, or unspecified chronic kidney disease: Secondary | ICD-10-CM | POA: Diagnosis not present

## 2023-04-26 DIAGNOSIS — N182 Chronic kidney disease, stage 2 (mild): Secondary | ICD-10-CM | POA: Diagnosis not present

## 2023-04-26 DIAGNOSIS — S065X0D Traumatic subdural hemorrhage without loss of consciousness, subsequent encounter: Secondary | ICD-10-CM | POA: Diagnosis not present

## 2023-04-26 DIAGNOSIS — F333 Major depressive disorder, recurrent, severe with psychotic symptoms: Secondary | ICD-10-CM | POA: Diagnosis not present

## 2023-04-26 DIAGNOSIS — C9201 Acute myeloblastic leukemia, in remission: Secondary | ICD-10-CM | POA: Diagnosis not present

## 2023-04-27 DIAGNOSIS — R531 Weakness: Secondary | ICD-10-CM | POA: Diagnosis not present

## 2023-04-27 DIAGNOSIS — N182 Chronic kidney disease, stage 2 (mild): Secondary | ICD-10-CM | POA: Diagnosis not present

## 2023-04-27 DIAGNOSIS — Z006 Encounter for examination for normal comparison and control in clinical research program: Secondary | ICD-10-CM | POA: Diagnosis not present

## 2023-04-27 DIAGNOSIS — C9201 Acute myeloblastic leukemia, in remission: Secondary | ICD-10-CM | POA: Diagnosis not present

## 2023-04-27 DIAGNOSIS — C9200 Acute myeloblastic leukemia, not having achieved remission: Secondary | ICD-10-CM | POA: Diagnosis not present

## 2023-04-28 DIAGNOSIS — E1122 Type 2 diabetes mellitus with diabetic chronic kidney disease: Secondary | ICD-10-CM | POA: Diagnosis not present

## 2023-04-28 DIAGNOSIS — F333 Major depressive disorder, recurrent, severe with psychotic symptoms: Secondary | ICD-10-CM | POA: Diagnosis not present

## 2023-04-28 DIAGNOSIS — J4540 Moderate persistent asthma, uncomplicated: Secondary | ICD-10-CM | POA: Diagnosis not present

## 2023-04-28 DIAGNOSIS — S065X0D Traumatic subdural hemorrhage without loss of consciousness, subsequent encounter: Secondary | ICD-10-CM | POA: Diagnosis not present

## 2023-04-28 DIAGNOSIS — E1165 Type 2 diabetes mellitus with hyperglycemia: Secondary | ICD-10-CM | POA: Diagnosis not present

## 2023-04-28 DIAGNOSIS — N182 Chronic kidney disease, stage 2 (mild): Secondary | ICD-10-CM | POA: Diagnosis not present

## 2023-04-28 DIAGNOSIS — I129 Hypertensive chronic kidney disease with stage 1 through stage 4 chronic kidney disease, or unspecified chronic kidney disease: Secondary | ICD-10-CM | POA: Diagnosis not present

## 2023-04-28 DIAGNOSIS — C9201 Acute myeloblastic leukemia, in remission: Secondary | ICD-10-CM | POA: Diagnosis not present

## 2023-04-28 DIAGNOSIS — J189 Pneumonia, unspecified organism: Secondary | ICD-10-CM | POA: Diagnosis not present

## 2023-05-02 DIAGNOSIS — F411 Generalized anxiety disorder: Secondary | ICD-10-CM

## 2023-05-02 DIAGNOSIS — R4189 Other symptoms and signs involving cognitive functions and awareness: Secondary | ICD-10-CM | POA: Diagnosis not present

## 2023-05-02 DIAGNOSIS — G43719 Chronic migraine without aura, intractable, without status migrainosus: Secondary | ICD-10-CM | POA: Diagnosis not present

## 2023-05-02 DIAGNOSIS — M5481 Occipital neuralgia: Secondary | ICD-10-CM | POA: Diagnosis not present

## 2023-05-03 MED ORDER — HYDROXYZINE HCL 50 MG PO TABS: 25.0000 mg | ORAL_TABLET | Freq: Two times a day (BID) | ORAL | 4 refills | Status: DC | PRN

## 2023-05-04 DIAGNOSIS — S065X0D Traumatic subdural hemorrhage without loss of consciousness, subsequent encounter: Secondary | ICD-10-CM | POA: Diagnosis not present

## 2023-05-04 DIAGNOSIS — F333 Major depressive disorder, recurrent, severe with psychotic symptoms: Secondary | ICD-10-CM | POA: Diagnosis not present

## 2023-05-04 DIAGNOSIS — C9201 Acute myeloblastic leukemia, in remission: Secondary | ICD-10-CM | POA: Diagnosis not present

## 2023-05-04 DIAGNOSIS — J189 Pneumonia, unspecified organism: Secondary | ICD-10-CM | POA: Diagnosis not present

## 2023-05-04 DIAGNOSIS — I129 Hypertensive chronic kidney disease with stage 1 through stage 4 chronic kidney disease, or unspecified chronic kidney disease: Secondary | ICD-10-CM | POA: Diagnosis not present

## 2023-05-04 DIAGNOSIS — J4540 Moderate persistent asthma, uncomplicated: Secondary | ICD-10-CM | POA: Diagnosis not present

## 2023-05-04 DIAGNOSIS — E1165 Type 2 diabetes mellitus with hyperglycemia: Secondary | ICD-10-CM | POA: Diagnosis not present

## 2023-05-04 DIAGNOSIS — N182 Chronic kidney disease, stage 2 (mild): Secondary | ICD-10-CM | POA: Diagnosis not present

## 2023-05-04 DIAGNOSIS — E1122 Type 2 diabetes mellitus with diabetic chronic kidney disease: Secondary | ICD-10-CM | POA: Diagnosis not present

## 2023-05-05 DIAGNOSIS — G20A1 Parkinson's disease without dyskinesia, without mention of fluctuations: Secondary | ICD-10-CM | POA: Diagnosis not present

## 2023-05-05 DIAGNOSIS — M549 Dorsalgia, unspecified: Secondary | ICD-10-CM | POA: Diagnosis not present

## 2023-05-05 DIAGNOSIS — I2510 Atherosclerotic heart disease of native coronary artery without angina pectoris: Secondary | ICD-10-CM | POA: Diagnosis not present

## 2023-05-05 DIAGNOSIS — C9201 Acute myeloblastic leukemia, in remission: Secondary | ICD-10-CM | POA: Diagnosis not present

## 2023-05-05 DIAGNOSIS — G43909 Migraine, unspecified, not intractable, without status migrainosus: Secondary | ICD-10-CM | POA: Diagnosis not present

## 2023-05-05 DIAGNOSIS — D696 Thrombocytopenia, unspecified: Secondary | ICD-10-CM | POA: Diagnosis not present

## 2023-05-05 DIAGNOSIS — Z5111 Encounter for antineoplastic chemotherapy: Secondary | ICD-10-CM | POA: Diagnosis not present

## 2023-05-05 DIAGNOSIS — R600 Localized edema: Secondary | ICD-10-CM | POA: Diagnosis not present

## 2023-05-05 DIAGNOSIS — C9200 Acute myeloblastic leukemia, not having achieved remission: Secondary | ICD-10-CM | POA: Diagnosis not present

## 2023-05-05 DIAGNOSIS — F209 Schizophrenia, unspecified: Secondary | ICD-10-CM | POA: Diagnosis not present

## 2023-05-06 DIAGNOSIS — Z5111 Encounter for antineoplastic chemotherapy: Secondary | ICD-10-CM | POA: Diagnosis not present

## 2023-05-06 DIAGNOSIS — C9201 Acute myeloblastic leukemia, in remission: Secondary | ICD-10-CM | POA: Diagnosis not present

## 2023-05-07 DIAGNOSIS — C9201 Acute myeloblastic leukemia, in remission: Secondary | ICD-10-CM | POA: Diagnosis not present

## 2023-05-07 DIAGNOSIS — Z5111 Encounter for antineoplastic chemotherapy: Secondary | ICD-10-CM | POA: Diagnosis not present

## 2023-05-08 DIAGNOSIS — Z5111 Encounter for antineoplastic chemotherapy: Secondary | ICD-10-CM | POA: Diagnosis not present

## 2023-05-08 DIAGNOSIS — C9201 Acute myeloblastic leukemia, in remission: Secondary | ICD-10-CM | POA: Diagnosis not present

## 2023-05-09 DIAGNOSIS — Z5111 Encounter for antineoplastic chemotherapy: Secondary | ICD-10-CM | POA: Diagnosis not present

## 2023-05-09 DIAGNOSIS — C9201 Acute myeloblastic leukemia, in remission: Secondary | ICD-10-CM | POA: Diagnosis not present

## 2023-05-10 DIAGNOSIS — J4540 Moderate persistent asthma, uncomplicated: Secondary | ICD-10-CM | POA: Diagnosis not present

## 2023-05-10 DIAGNOSIS — I129 Hypertensive chronic kidney disease with stage 1 through stage 4 chronic kidney disease, or unspecified chronic kidney disease: Secondary | ICD-10-CM | POA: Diagnosis not present

## 2023-05-10 DIAGNOSIS — E1165 Type 2 diabetes mellitus with hyperglycemia: Secondary | ICD-10-CM | POA: Diagnosis not present

## 2023-05-10 DIAGNOSIS — N182 Chronic kidney disease, stage 2 (mild): Secondary | ICD-10-CM | POA: Diagnosis not present

## 2023-05-10 DIAGNOSIS — E1122 Type 2 diabetes mellitus with diabetic chronic kidney disease: Secondary | ICD-10-CM | POA: Diagnosis not present

## 2023-05-10 DIAGNOSIS — F333 Major depressive disorder, recurrent, severe with psychotic symptoms: Secondary | ICD-10-CM | POA: Diagnosis not present

## 2023-05-10 DIAGNOSIS — J189 Pneumonia, unspecified organism: Secondary | ICD-10-CM | POA: Diagnosis not present

## 2023-05-10 DIAGNOSIS — S065X0D Traumatic subdural hemorrhage without loss of consciousness, subsequent encounter: Secondary | ICD-10-CM | POA: Diagnosis not present

## 2023-05-10 DIAGNOSIS — C9201 Acute myeloblastic leukemia, in remission: Secondary | ICD-10-CM | POA: Diagnosis not present

## 2023-05-12 DIAGNOSIS — F333 Major depressive disorder, recurrent, severe with psychotic symptoms: Secondary | ICD-10-CM | POA: Diagnosis not present

## 2023-05-12 DIAGNOSIS — I129 Hypertensive chronic kidney disease with stage 1 through stage 4 chronic kidney disease, or unspecified chronic kidney disease: Secondary | ICD-10-CM | POA: Diagnosis not present

## 2023-05-12 DIAGNOSIS — E1165 Type 2 diabetes mellitus with hyperglycemia: Secondary | ICD-10-CM | POA: Diagnosis not present

## 2023-05-12 DIAGNOSIS — S065X0D Traumatic subdural hemorrhage without loss of consciousness, subsequent encounter: Secondary | ICD-10-CM | POA: Diagnosis not present

## 2023-05-12 DIAGNOSIS — J189 Pneumonia, unspecified organism: Secondary | ICD-10-CM | POA: Diagnosis not present

## 2023-05-12 DIAGNOSIS — N182 Chronic kidney disease, stage 2 (mild): Secondary | ICD-10-CM | POA: Diagnosis not present

## 2023-05-12 DIAGNOSIS — C9201 Acute myeloblastic leukemia, in remission: Secondary | ICD-10-CM | POA: Diagnosis not present

## 2023-05-12 DIAGNOSIS — E1122 Type 2 diabetes mellitus with diabetic chronic kidney disease: Secondary | ICD-10-CM | POA: Diagnosis not present

## 2023-05-12 DIAGNOSIS — J4540 Moderate persistent asthma, uncomplicated: Secondary | ICD-10-CM | POA: Diagnosis not present

## 2023-05-17 DIAGNOSIS — J4540 Moderate persistent asthma, uncomplicated: Secondary | ICD-10-CM | POA: Diagnosis not present

## 2023-05-17 DIAGNOSIS — C9201 Acute myeloblastic leukemia, in remission: Secondary | ICD-10-CM | POA: Diagnosis not present

## 2023-05-17 DIAGNOSIS — N182 Chronic kidney disease, stage 2 (mild): Secondary | ICD-10-CM | POA: Diagnosis not present

## 2023-05-17 DIAGNOSIS — S065X0D Traumatic subdural hemorrhage without loss of consciousness, subsequent encounter: Secondary | ICD-10-CM | POA: Diagnosis not present

## 2023-05-17 DIAGNOSIS — J189 Pneumonia, unspecified organism: Secondary | ICD-10-CM | POA: Diagnosis not present

## 2023-05-17 DIAGNOSIS — I129 Hypertensive chronic kidney disease with stage 1 through stage 4 chronic kidney disease, or unspecified chronic kidney disease: Secondary | ICD-10-CM | POA: Diagnosis not present

## 2023-05-17 DIAGNOSIS — E1165 Type 2 diabetes mellitus with hyperglycemia: Secondary | ICD-10-CM | POA: Diagnosis not present

## 2023-05-17 DIAGNOSIS — F333 Major depressive disorder, recurrent, severe with psychotic symptoms: Secondary | ICD-10-CM | POA: Diagnosis not present

## 2023-05-17 DIAGNOSIS — E1122 Type 2 diabetes mellitus with diabetic chronic kidney disease: Secondary | ICD-10-CM | POA: Diagnosis not present

## 2023-05-18 DIAGNOSIS — J189 Pneumonia, unspecified organism: Secondary | ICD-10-CM | POA: Diagnosis not present

## 2023-05-18 DIAGNOSIS — F333 Major depressive disorder, recurrent, severe with psychotic symptoms: Secondary | ICD-10-CM | POA: Diagnosis not present

## 2023-05-18 DIAGNOSIS — E1122 Type 2 diabetes mellitus with diabetic chronic kidney disease: Secondary | ICD-10-CM | POA: Diagnosis not present

## 2023-05-18 DIAGNOSIS — I129 Hypertensive chronic kidney disease with stage 1 through stage 4 chronic kidney disease, or unspecified chronic kidney disease: Secondary | ICD-10-CM | POA: Diagnosis not present

## 2023-05-18 DIAGNOSIS — S065X0D Traumatic subdural hemorrhage without loss of consciousness, subsequent encounter: Secondary | ICD-10-CM | POA: Diagnosis not present

## 2023-05-18 DIAGNOSIS — N182 Chronic kidney disease, stage 2 (mild): Secondary | ICD-10-CM | POA: Diagnosis not present

## 2023-05-18 DIAGNOSIS — J4540 Moderate persistent asthma, uncomplicated: Secondary | ICD-10-CM | POA: Diagnosis not present

## 2023-05-18 DIAGNOSIS — E1165 Type 2 diabetes mellitus with hyperglycemia: Secondary | ICD-10-CM | POA: Diagnosis not present

## 2023-05-18 DIAGNOSIS — C9201 Acute myeloblastic leukemia, in remission: Secondary | ICD-10-CM | POA: Diagnosis not present

## 2023-05-19 DIAGNOSIS — M7918 Myalgia, other site: Secondary | ICD-10-CM | POA: Diagnosis not present

## 2023-05-19 DIAGNOSIS — M5481 Occipital neuralgia: Secondary | ICD-10-CM | POA: Diagnosis not present

## 2023-05-20 ENCOUNTER — Telehealth (INDEPENDENT_AMBULATORY_CARE_PROVIDER_SITE_OTHER): Admitting: Psychiatry

## 2023-05-20 ENCOUNTER — Encounter: Payer: Self-pay | Admitting: Psychiatry

## 2023-05-20 DIAGNOSIS — E1165 Type 2 diabetes mellitus with hyperglycemia: Secondary | ICD-10-CM | POA: Diagnosis not present

## 2023-05-20 DIAGNOSIS — N182 Chronic kidney disease, stage 2 (mild): Secondary | ICD-10-CM | POA: Diagnosis not present

## 2023-05-20 DIAGNOSIS — G2119 Other drug induced secondary parkinsonism: Secondary | ICD-10-CM

## 2023-05-20 DIAGNOSIS — R52 Pain, unspecified: Secondary | ICD-10-CM

## 2023-05-20 DIAGNOSIS — F4310 Post-traumatic stress disorder, unspecified: Secondary | ICD-10-CM | POA: Diagnosis not present

## 2023-05-20 DIAGNOSIS — C9201 Acute myeloblastic leukemia, in remission: Secondary | ICD-10-CM | POA: Diagnosis not present

## 2023-05-20 DIAGNOSIS — S065X0D Traumatic subdural hemorrhage without loss of consciousness, subsequent encounter: Secondary | ICD-10-CM | POA: Diagnosis not present

## 2023-05-20 DIAGNOSIS — F251 Schizoaffective disorder, depressive type: Secondary | ICD-10-CM

## 2023-05-20 DIAGNOSIS — E1122 Type 2 diabetes mellitus with diabetic chronic kidney disease: Secondary | ICD-10-CM | POA: Diagnosis not present

## 2023-05-20 DIAGNOSIS — J4540 Moderate persistent asthma, uncomplicated: Secondary | ICD-10-CM | POA: Diagnosis not present

## 2023-05-20 DIAGNOSIS — J189 Pneumonia, unspecified organism: Secondary | ICD-10-CM | POA: Diagnosis not present

## 2023-05-20 DIAGNOSIS — F411 Generalized anxiety disorder: Secondary | ICD-10-CM | POA: Diagnosis not present

## 2023-05-20 DIAGNOSIS — G4701 Insomnia due to medical condition: Secondary | ICD-10-CM | POA: Diagnosis not present

## 2023-05-20 DIAGNOSIS — I129 Hypertensive chronic kidney disease with stage 1 through stage 4 chronic kidney disease, or unspecified chronic kidney disease: Secondary | ICD-10-CM | POA: Diagnosis not present

## 2023-05-20 DIAGNOSIS — F333 Major depressive disorder, recurrent, severe with psychotic symptoms: Secondary | ICD-10-CM | POA: Diagnosis not present

## 2023-05-20 NOTE — Progress Notes (Signed)
 Virtual Visit via Telephone Note  I connected with Heather Haley on 05/20/23 at 11:40 AM EDT by telephone and verified that I am speaking with the correct person using two identifiers.  Location Provider Location : [facility] Patient Location : [facility] ( [place])  Participants: Patient , Provider    I discussed the limitations, risks, security and privacy concerns of performing an evaluation and management service by telephone and the availability of in person appointments. I also discussed with the patient that there may be a patient responsible charge related to this service. The patient expressed understanding and agreed to proceed.   I discussed the assessment and treatment plan with the patient. The patient was provided an opportunity to ask questions and all were answered. The patient agreed with the plan and demonstrated an understanding of the instructions.   The patient was advised to call back or seek an in-person evaluation if the symptoms worsen or if the condition fails to improve as anticipated.    BH MD OP Progress Note  05/20/2023 12:34 PM Heather Haley  MRN:  [id number]  Chief Complaint:  Chief Complaint  Patient presents with   schioaffective disorder   Anxiety   Follow-up   Medication Refill   HPI: Heather Haley is a 63 year old Caucasian female, lives in [place] with her son, has a history of PTSD, schizoaffective disorder, generalized anxiety disorder, drug-induced Parkinson's disease, insomnia, headaches, diabetes mellitus, chronic pain, acute myeloid leukemia currently under the care of oncology was evaluated by phone today.  Patient today appeared to be alert, oriented to person place time situation.  She reports she is currently undergoing treatment for acute myeloid leukemia and is currently under the care of [facility] oncologist.  She has good support system from her son and her daughter.  They provide transportation.  She currently lives with her  son.  Overall mood symptoms are manageable.  She reports she stopped taking all of her psychotropic medications previously however that caused agitation and heightened anxiety.  She hence is currently back on one of her medications most likely the amitriptyline.  She however agrees to bring all her medications at her next visit in 2 weeks at the office for reconciliation.  Patient currently denies any suicidality, homicidality or perceptual disturbances.  She reports sleep is overall okay.  Her appetite is fair although she lost several pounds in the past few months.  She denies any other concerns today.  Visit Diagnosis:    ICD-10-CM   1. Schizoaffective disorder, depressive type (HCC)  F25.1     2. PTSD (post-traumatic stress disorder)  F43.10     3. Generalized anxiety disorder  F41.1     4. Drug-induced Parkinson's disease (HCC)  G21.19     5. Insomnia due to medical condition  G47.01    Anxiety,pain      Past Psychiatric History: I have reviewed past psychiatric history from progress note on 05/25/2017.  Past Medical History:  Past Medical History:  Diagnosis Date   Anxiety    Asthma    Chronic kidney disease    Chronic pain    previously saw Dr. Shireen Quan in pain clinic, then saw pain specialist in [facility]   Depression    Diabetes mellitus (HCC)    Frequency of urination    GERD (gastroesophageal reflux disease)    Headache(784.0)    High cholesterol    History of kidney stones    Hypertension    IBS (irritable bowel syndrome)  Left ankle instability    Left knee DJD    Lumbar Degenerative Disc Disease of  10/11/2014   Neuromuscular disorder (HCC)    Osteoarthritis of hip (Right) 05/05/2015   Other enthesopathy of ankle and tarsus 12/15/2009   Qualifier: Diagnosis of  By: Darrick Penna MD, KARL     Parkinson's disease (HCC)    Peripheral sensory neuropathy (Bilateral) 11/19/2014   Postoperative nausea and vomiting    Schizophrenia (HCC)     Past Surgical History:   Procedure Laterality Date   ABDOMINAL HYSTERECTOMY     ANKLE SURGERY Left    x 2   APPENDECTOMY     CARDIAC CATHETERIZATION     COLONOSCOPY  2013   COLONOSCOPY WITH PROPOFOL N/A 04/25/2017   Procedure: COLONOSCOPY WITH PROPOFOL;  Surgeon: Scot Jun, MD;  Location: [facility] ENDOSCOPY;  Service: Endoscopy;  Laterality: N/A;   COLONOSCOPY WITH PROPOFOL N/A 05/25/2022   Procedure: COLONOSCOPY WITH PROPOFOL;  Surgeon: [name], MD;  Location: [facility] ENDOSCOPY;  Service: Endoscopy;  Laterality: N/A;   CYSTOSCOPY/URETEROSCOPY/HOLMIUM LASER/STENT PLACEMENT Left 09/11/2019   Procedure: CYSTOSCOPY/URETEROSCOPY/HOLMIUM LASER/STENT PLACEMENT;  Surgeon: Riki Altes, MD;  Location: [facility] ORS;  Service: Urology;  Laterality: Left;   ESOPHAGOGASTRODUODENOSCOPY (EGD) WITH PROPOFOL N/A 10/03/2014   Procedure: ESOPHAGOGASTRODUODENOSCOPY (EGD) WITH PROPOFOL;  Surgeon: Elnita Maxwell, MD;  Location: [facility] ENDOSCOPY;  Service: Endoscopy;  Laterality: N/A;   ESOPHAGOGASTRODUODENOSCOPY (EGD) WITH PROPOFOL  04/25/2017   Procedure: ESOPHAGOGASTRODUODENOSCOPY (EGD) WITH PROPOFOL;  Surgeon: Scot Jun, MD;  Location: [facility] ENDOSCOPY;  Service: Endoscopy;;   ESOPHAGOGASTRODUODENOSCOPY (EGD) WITH PROPOFOL N/A 05/25/2022   Procedure: ESOPHAGOGASTRODUODENOSCOPY (EGD) WITH PROPOFOL;  Surgeon: [name], MD;  Location: [facility] ENDOSCOPY;  Service: Endoscopy;  Laterality: N/A;   IR BONE MARROW BIOPSY & ASPIRATION  01/03/2023   JOINT REPLACEMENT Left    knee   KNEE ARTHROSCOPY  1997   left knee   LEFT HEART CATH AND CORONARY ANGIOGRAPHY Left 11/10/2017   Procedure: LEFT HEART CATH AND CORONARY ANGIOGRAPHY;  Surgeon: Marcina Millard, MD;  Location: [facility] INVASIVE CV LAB;  Service: Cardiovascular;  Laterality: Left;   LEFT HEART CATHETERIZATION WITH CORONARY ANGIOGRAM N/A 01/11/2013   Procedure: LEFT HEART CATHETERIZATION WITH CORONARY ANGIOGRAM;  Surgeon: [name], MD;   Location: [facility] CATH LAB;  Service: Cardiovascular;  Laterality: N/A;   TOTAL KNEE ARTHROPLASTY  08/30/2011   Procedure: TOTAL KNEE ARTHROPLASTY;  Surgeon: Nilda Simmer, MD;  Location: [facility] OR;  Service: Orthopedics;  Laterality: Left;    Family Psychiatric History: I have reviewed family psychiatric history from progress note on 05/25/2017.  Family History:  Family History  Problem Relation Age of Onset   Cancer Mother    Colon cancer Mother    Hypertension Father    Heart disease Father    Alcohol abuse Father    Breast cancer Sister     Social History: I reviewed social history from progress note on 05/25/2017. Social History   Socioeconomic History   Marital status: Divorced    Spouse name: Not on file   Number of children: 2   Years of education: 11   Highest education level: 11th grade  Occupational History    Employer: [employer]  Tobacco Use   Smoking status: Former    Current packs/day: 0.00    Average packs/day: 2.0 packs/day for 15.0 years (30.0 ttl pk-yrs)    Types: Cigarettes    Start date: 02/09/1980    Quit date: 02/09/1995  Years since quitting: 28.2   Smokeless tobacco: Never  Vaping Use   Vaping status: Never Used  Substance and Sexual Activity   Alcohol use: No    Alcohol/week: 0.0 standard drinks of alcohol   Drug use: No   Sexual activity: Not Currently  Other Topics Concern   Not on file  Social History Narrative   Patient lives at home alone. Patient works at [employer].   Caffeine daily- 2   Right handed.   Education- 11 th grade   Social Drivers of Corporate investment banker Strain: Low Risk  (04/04/2023)   Received from [facility]   Overall Financial Resource Strain (CARDIA)    Difficulty of Paying Living Expenses: Not hard at all  Recent Concern: Financial Resource Strain - Medium Risk (01/27/2023)   Received from [facility]   Overall Financial Resource Strain (CARDIA)    Difficulty of Paying Living  Expenses: Somewhat hard  Food Insecurity: No Food Insecurity (04/04/2023)   [facility]   Hunger Vital Sign    Worried About Running Out of Food in the Last Year: Never true    Ran Out of Food in the Last Year: Never true  Transportation Needs: No Transportation Needs (04/04/2023)   Received from [facility] - Transportation    In the past 12 months, has lack of transportation kept you from medical appointments or from getting medications?: No    Lack of Transportation (Non-Medical): No  Physical Activity: Inactive (01/27/2023)   Received from [facility]   Exercise Vital Sign    Minutes of Exercise per Session: 10 min    Days of Exercise per Week: 0 days  Stress: Stress Concern Present (01/27/2023)   Received from [facility] - Occupational Stress Questionnaire    Feeling of Stress : Rather much  Social Connections: Socially Isolated (03/06/2023)   Social Connection and Isolation Panel [NHANES]    Frequency of Communication with Friends and Family: More than three times a week    Frequency of Social Gatherings with Friends and Family: More than three times a week    Attends Religious Services: Never    Database administrator or Organizations: No    Attends Banker Meetings: Never    Marital Status: Divorced    Allergies:  Allergies  Allergen Reactions   Codeine Hives   Cymbalta [Duloxetine Hcl] Other (See Comments)    Altered mental status, Alopecia, visual hallucinations, nightmares   Gabapentin Swelling   Nsaids Anaphylaxis    REACTION: palpitations, diaphoresis  Reaction: Diaphoresis / Sweating (intolerance); AND CHEST PAINS   Other Reaction(s): Diaphoresis / Sweating (intolerance)    Reaction: Diaphoresis / Sweating (intolerance); AND CHEST PAINS     AND CHEST PAINS    "feel like I'm having a heart attack"   Prolixin Decanoate [Fluphenazine]      Ineffective    Benztropine Mesylate Other (See Comments)   Buprenorphine Hcl Other (See Comments)    Unable to void   Morphine And Codeine Other (See Comments)    Unable to void   Other Other (See Comments)    "Anesthesia makes me sick"   Risperidone And Related Other (See Comments)    tremors   Wellbutrin [Bupropion]     Constipation, mood swings   Benztropine Other (See Comments)    Hair fall out  Suicide thoughts  Carbidopa-Levodopa Nausea Only   Lyrica [Pregabalin] Other (See Comments)    Alopecia, visual hallucinations, nightmares Altered mental status   Nortripytline Hcl [Nortriptyline] Other (See Comments)    Hair loss and night mares    Penicillins Nausea And Vomiting and Other (See Comments)    REACTION: upset stomach Has patient had a PCN reaction causing immediate rash, facial/tongue/throat swelling, SOB or lightheadedness with hypotension: No Has patient had a PCN reaction causing severe rash involving mucus membranes or skin necrosis: No Has patient had a PCN reaction that required hospitalization: No Has patient had a PCN reaction occurring within the last 10 years: No If all of the above answers are "NO", then may proceed with Cephalosporin use.   Tolmetin Other (See Comments) and Palpitations    REACTION: palpitations, diaphoresis    Metabolic Disorder Labs: Lab Results  Component Value Date   HGBA1C 7.0 (H) 03/06/2023   MPG 154.2 03/06/2023   MPG 131.24 03/06/2017   Lab Results  Component Value Date   PROLACTIN 4.5 (L) 06/13/2020   PROLACTIN 64.2 (H) 03/06/2017   Lab Results  Component Value Date   CHOL 146 08/22/2018   TRIG 172 (H) 08/22/2018   HDL 49 08/22/2018   CHOLHDL 3.0 08/22/2018   VLDL 34 08/22/2018   LDLCALC 63 08/22/2018   LDLCALC 59 03/06/2017   Lab Results  Component Value Date   TSH 0.798 03/07/2023   TSH 0.959 08/22/2018    Therapeutic Level Labs: No results found for: "LITHIUM" Lab Results  Component Value Date    VALPROATE 43 (L) 09/25/2022   No results found for: "CBMZ"  Current Medications: Current Outpatient Medications  Medication Sig Dispense Refill   ACCU-CHEK GUIDE test strip      acetaminophen (TYLENOL) 500 MG tablet Take 1,000 mg by mouth every 6 (six) hours as needed for moderate pain (pain score 4-6) or headache.     allopurinol (ZYLOPRIM) 300 MG tablet Take 300 mg by mouth daily.     amitriptyline (ELAVIL) 50 MG tablet TAKE 1 AND 1/2 TABLETS AT BEDTIME 135 tablet 1   amLODipine (NORVASC) 5 MG tablet Take 5 mg by mouth daily.     aspirin EC 81 MG tablet Take 81 mg by mouth daily as needed for moderate pain (pain score 4-6). Swallow whole.     B-D ULTRAFINE III SHORT PEN 31G X 8 MM MISC Inject into the skin.     buprenorphine (BUTRANS) 20 MCG/HR PTWK Place 1 patch onto the skin once a week.     carbidopa-levodopa (SINEMET IR) 25-100 MG tablet Take 1 tablet by mouth 3 (three) times daily.     Cholecalciferol (VITAMIN D-3) 125 MCG (5000 UT) TABS Take 1,000 Units by mouth daily. (Patient not taking: Reported on 03/06/2023)     hydrOXYzine (ATARAX) 50 MG tablet Take 0.5-1 tablets (25-50 mg total) by mouth 2 (two) times daily as needed. 60 tablet 4   insulin aspart (FIASP FLEXTOUCH) 100 UNIT/ML FlexTouch Pen Inject 10 Units into the skin daily in the afternoon. Takes 10 units at 10:00 am, 16 units at 4 pm, 10 units at 10 pm.     insulin degludec (TRESIBA FLEXTOUCH) 100 UNIT/ML FlexTouch Pen Inject 38 Units into the skin daily. At 10:00 pm     isosorbide mononitrate (IMDUR) 30 MG 24 hr tablet Take 30 mg by mouth daily.     levothyroxine (SYNTHROID) 50 MCG tablet Take 50 mcg by mouth See admin instructions. Saturdays and Sundays only. Takes 75  MCG tab Monday-Friday     levothyroxine (SYNTHROID) 75 MCG tablet Take 75 mcg by mouth See admin instructions. Monday-Friday. Takes 50 MCG tab on Saturdays and Sundays     magnesium oxide (MAG-OX) 400 (240 Mg) MG tablet Take 800 mg by mouth daily.      metFORMIN (GLUCOPHAGE) 500 MG tablet Take 1,000 mg by mouth 2 (two) times daily with a meal.     metoprolol tartrate (LOPRESSOR) 25 MG tablet Take 25 mg by mouth 2 (two) times daily.     nitroGLYCERIN (NITRODUR - DOSED IN MG/24 HR) 0.4 mg/hr patch Place 0.4 mg onto the skin as needed. (Patient not taking: Reported on 11/22/2022)     OLANZapine (ZYPREXA) 5 MG tablet Take 0.5 tablets (2.5 mg total) by mouth at bedtime. (Patient not taking: Reported on 05/20/2023) 90 tablet 3   Omega-3 Fatty Acids (FISH OIL) 1000 MG CAPS Take 1 capsule by mouth daily. (Patient not taking: Reported on 03/06/2023)     ondansetron (ZOFRAN) 8 MG tablet Take 8 mg by mouth in the morning, at noon, and at bedtime.     oxyCODONE (OXY IR/ROXICODONE) 5 MG immediate release tablet Take 5-10 mg by mouth 2 (two) times daily as needed for moderate pain (pain score 4-6).     pravastatin (PRAVACHOL) 40 MG tablet Take 40 mg by mouth daily.     rOPINIRole (REQUIP) 0.5 MG tablet Take 0.5 mg by mouth 3 (three) times daily.     Semaglutide 3 MG TABS Take 3 mg by mouth daily.     SUMAtriptan (IMITREX) 100 MG tablet Take 100 mg by mouth every 2 (two) hours as needed for migraine. May repeat in 2 hours if headache persists or recurs.     torsemide (DEMADEX) 20 MG tablet Take 20 mg by mouth every other day.     valACYclovir (VALTREX) 500 MG tablet Take 500 mg by mouth daily. (Patient not taking: Reported on 03/06/2023)     venetoclax (VENCLEXTA) 100 MG tablet Take 400 mg by mouth daily. Tablets should be swallowed whole with a meal and a full glass of water.     No current facility-administered medications for this visit.     Musculoskeletal: Strength & Muscle Tone:  UTA Gait & Station:  UTA Patient leans: N/A  Psychiatric Specialty Exam: Review of Systems  Psychiatric/Behavioral:  The patient is nervous/anxious.     There were no vitals taken for this visit.There is no height or weight on file to calculate BMI.  General Appearance:   UTA  Eye Contact:   UTA  Speech:  Clear and Coherent  Volume:  Normal  Mood:  Anxious  Affect:   UTA  Thought Process:  Goal Directed and Descriptions of Associations: Intact  Orientation:  Full (Time, Place, and Person)  Thought Content: Logical   Suicidal Thoughts:  No  Homicidal Thoughts:  No  Memory:  Immediate;   Fair  Judgement:  Fair  Insight:  Fair  Psychomotor Activity:   UTA  Concentration:  Concentration: Fair and Attention Span: Fair  Recall:  [employer] of Knowledge: Fair  Language: Fair  Akathisia:  No  Handed:  Right  AIMS (if indicated): not done  Assets:  Desire for Improvement Housing Social Support  ADL's:  Intact  Cognition: WNL  Sleep:  Fair   Screenings: Midwife Visit from 09/08/2022 in [facility] Office Visit from 04/27/2022 in [facility] Office Visit from 03/16/2022 in [facility] Office Visit from 12/14/2021 in [facility] Office Visit from 08/28/2021 in [facility]  AIMS Total Score 5 0 0 0 0      AUDIT    Flowsheet Row Admission (Discharged) from OP Visit from 08/30/2018 [facility] Ad[facility] (Discharged) from [facility]2017 [facility]  Alcohol Use Disorder Identification Test Final Score (AUDIT) 0 0 0      GAD-7    Flowsheet Row Office Visit from 09/08/2022 in [facility] Office Visit from 04/27/2022 in [facility] Office Visit from 03/16/2022 in [facility] Office Visit from 12/14/2021 in [facility] Office Visit from  08/28/2021 in [facility]  Total GAD-7 Score 5 5 5 8 5       PHQ2-9    Flowsheet Row Office Visit from 11/22/2022 in [facility] [place] Office Visit from 09/08/2022 in [facility] Office Visit from 04/27/2022 in [facility] Office Visit from 03/16/2022 in [facility] Office Visit from 12/14/2021 in [facility]  PHQ-2 Total Score 1 2 0 0 1  PHQ-9 Total Score -- 6 8 6  --      Flowsheet Row Video Visit from 05/20/2023 in [facility] ED to Hosp-Admission (Discharged) from 03/06/2023 in [place] LONG 4TH FLOOR PROGRESSIVE CARE AND UROLOGY ED from 01/23/2023 in [facility] Emergency Department at [facility]  C-SSRS RISK CATEGORY No Risk No Risk No Risk        Assessment and Plan: Heather Haley is a 63 year old Caucasian female who has a history of schizoaffective disorder, PTSD, migraine headaches, acute myeloid leukemia currently under the care of [facility] was evaluated by phone today.  Discussed assessment and plan as noted below.  Schizoaffective disorder-stable Currently denies any significant psychosis although does have anxiety related to her situational stressors currently managing it okay. - Continue current medication regimen.  She is not sure what psychotropic she was continued on however agrees to bring this to her office visit in 3 weeks from now.  Previously prescribed Olanzapine 2.5 mg at bedtime.  Patient unable to clarify at this time if she is still taking it.  GAD-improving Currently reports anxiety managed on the current medication regimen. - Continue current medication regimen, previously prescribed Elavil 75 mg at bedtime. - Continue Hydroxyzine 25 to 50 mg twice a day as  needed. - Consider referral for CBT, we will reevaluate at her in person visit in 3 weeks from now.  PTSD-stable Currently denies any significant concerns. - Continue current medication regimen.  Insomnia-stable Currently denies any sleep issues. - Continue current medication regimen.  Follow-up - Follow-up in clinic in 3 weeks or sooner if needed.    Consent: Patient/Guardian gives verbal consent for treatment and assignment of benefits for services provided during this visit. Patient/Guardian expressed understanding and agreed to proceed.   I have spent at least 15 minutes non face to face with patient today.   This note was generated in part or whole with voice recognition software. Voice recognition is usually quite  accurate but there are transcription errors that can and very often do occur. I apologize for any typographical errors that were not detected and corrected.     Jomarie Longs, MD 05/20/2023, 12:34 PM

## 2023-05-23 ENCOUNTER — Telehealth: Payer: Self-pay

## 2023-05-23 DIAGNOSIS — C9201 Acute myeloblastic leukemia, in remission: Secondary | ICD-10-CM | POA: Diagnosis not present

## 2023-05-23 DIAGNOSIS — J189 Pneumonia, unspecified organism: Secondary | ICD-10-CM | POA: Diagnosis not present

## 2023-05-23 DIAGNOSIS — J4540 Moderate persistent asthma, uncomplicated: Secondary | ICD-10-CM | POA: Diagnosis not present

## 2023-05-23 DIAGNOSIS — F333 Major depressive disorder, recurrent, severe with psychotic symptoms: Secondary | ICD-10-CM | POA: Diagnosis not present

## 2023-05-23 DIAGNOSIS — I129 Hypertensive chronic kidney disease with stage 1 through stage 4 chronic kidney disease, or unspecified chronic kidney disease: Secondary | ICD-10-CM | POA: Diagnosis not present

## 2023-05-23 DIAGNOSIS — S065X0D Traumatic subdural hemorrhage without loss of consciousness, subsequent encounter: Secondary | ICD-10-CM | POA: Diagnosis not present

## 2023-05-23 DIAGNOSIS — E1122 Type 2 diabetes mellitus with diabetic chronic kidney disease: Secondary | ICD-10-CM | POA: Diagnosis not present

## 2023-05-23 DIAGNOSIS — E1165 Type 2 diabetes mellitus with hyperglycemia: Secondary | ICD-10-CM | POA: Diagnosis not present

## 2023-05-23 DIAGNOSIS — N182 Chronic kidney disease, stage 2 (mild): Secondary | ICD-10-CM | POA: Diagnosis not present

## 2023-05-23 NOTE — Telephone Encounter (Signed)
 Patient being a limited historian was not sure if she was still taking this medication, olanzapine.  She however does have an upcoming appointment and she was advised to bring all her medications to the office since it is an in person visit.  I have also reviewed list of medications received from [facility], post discharge medication dated 04/01/2023.  I do not see olanzapine in that list as well.

## 2023-05-23 NOTE — Telephone Encounter (Signed)
 received fax requesting refill on the olanzapine. pt was last seen on 4-11 next appt 4-30. (it does not look like rx went to pharmacy

## 2023-05-25 NOTE — Telephone Encounter (Signed)
 received fax requesting a refill on the olanzapine 5mg . pt was last seen on -11 next appt 4-30. rx does not look as if it was sent to the pharmacy

## 2023-05-25 NOTE — Telephone Encounter (Signed)
 It appears there is some uncertainty regarding whether she has been taking this medication, and notes suggest she may have been on olanzapine 2.5 mg. Could you please contact the pharmacy to verify the prescribed dose and the last time it was filled?

## 2023-05-26 NOTE — Telephone Encounter (Signed)
 pt states she is not taking it.

## 2023-05-26 NOTE — Telephone Encounter (Signed)
 Will hold this medication then.

## 2023-05-27 DIAGNOSIS — F333 Major depressive disorder, recurrent, severe with psychotic symptoms: Secondary | ICD-10-CM | POA: Diagnosis not present

## 2023-05-27 DIAGNOSIS — N182 Chronic kidney disease, stage 2 (mild): Secondary | ICD-10-CM | POA: Diagnosis not present

## 2023-05-27 DIAGNOSIS — C9201 Acute myeloblastic leukemia, in remission: Secondary | ICD-10-CM | POA: Diagnosis not present

## 2023-05-27 DIAGNOSIS — I129 Hypertensive chronic kidney disease with stage 1 through stage 4 chronic kidney disease, or unspecified chronic kidney disease: Secondary | ICD-10-CM | POA: Diagnosis not present

## 2023-05-27 DIAGNOSIS — J189 Pneumonia, unspecified organism: Secondary | ICD-10-CM | POA: Diagnosis not present

## 2023-05-27 DIAGNOSIS — E1122 Type 2 diabetes mellitus with diabetic chronic kidney disease: Secondary | ICD-10-CM | POA: Diagnosis not present

## 2023-05-27 DIAGNOSIS — J4540 Moderate persistent asthma, uncomplicated: Secondary | ICD-10-CM | POA: Diagnosis not present

## 2023-05-27 DIAGNOSIS — S065X0D Traumatic subdural hemorrhage without loss of consciousness, subsequent encounter: Secondary | ICD-10-CM | POA: Diagnosis not present

## 2023-05-27 DIAGNOSIS — E1165 Type 2 diabetes mellitus with hyperglycemia: Secondary | ICD-10-CM | POA: Diagnosis not present

## 2023-06-02 DIAGNOSIS — C9201 Acute myeloblastic leukemia, in remission: Secondary | ICD-10-CM | POA: Diagnosis not present

## 2023-06-02 DIAGNOSIS — C9200 Acute myeloblastic leukemia, not having achieved remission: Secondary | ICD-10-CM | POA: Diagnosis not present

## 2023-06-06 ENCOUNTER — Telehealth: Payer: Self-pay | Admitting: Oncology

## 2023-06-06 NOTE — Telephone Encounter (Signed)
 Referral inbasket to schedule MD visit (Dr.Yu)  Spoke with pt, she said to call her daughter, she gave me daughters name and number (also listed already in pt contacts). I called pt daughter and left a vm explaining that I spoke with her mom and was told to call her to schedule appt.

## 2023-06-08 ENCOUNTER — Other Ambulatory Visit: Payer: Self-pay

## 2023-06-08 ENCOUNTER — Ambulatory Visit: Payer: Medicare HMO | Admitting: Psychiatry

## 2023-06-08 ENCOUNTER — Encounter: Payer: Self-pay | Admitting: Psychiatry

## 2023-06-08 VITALS — BP 98/68 | HR 103 | Temp 96.7°F | Ht 65.0 in | Wt 212.6 lb

## 2023-06-08 DIAGNOSIS — F4310 Post-traumatic stress disorder, unspecified: Secondary | ICD-10-CM

## 2023-06-08 DIAGNOSIS — F251 Schizoaffective disorder, depressive type: Secondary | ICD-10-CM

## 2023-06-08 DIAGNOSIS — G2119 Other drug induced secondary parkinsonism: Secondary | ICD-10-CM

## 2023-06-08 DIAGNOSIS — G4701 Insomnia due to medical condition: Secondary | ICD-10-CM

## 2023-06-08 DIAGNOSIS — F411 Generalized anxiety disorder: Secondary | ICD-10-CM

## 2023-06-08 MED ORDER — AMITRIPTYLINE HCL 50 MG PO TABS
ORAL_TABLET | ORAL | Status: AC
Start: 2023-06-08 — End: ?

## 2023-06-08 NOTE — Progress Notes (Signed)
 [facility] MD OP Progress Note  06/08/2023 4:56 PM CHANCY IXTA  MRN:  [id number]  Chief Complaint:  Chief Complaint  Patient presents with   Follow-up   Hallucinations   Anxiety   Medication Refill   Discussed the use of AI scribe software for clinical note transcription with the patient, who gave verbal consent to proceed.  History of Present Illness Heather Haley is a 63 year old Caucasian female, lives in [place], has a history of PTSD, schizoaffective disorder, generalized anxiety disorder, drug-induced Parkinson's disease, insomnia, headaches, diabetes mellitus, chronic pain, acute myeloid leukemia, currently under the care of oncology was evaluated in office today for a follow-up appointment.  Patient presented along with her daughter Bridgette Campus who provided collateral information.  She is experiencing memory issues following a head injury sustained from a fall at her son's house, which resulted in a month-long hospitalization end of January 2025. Since the fall, she has had difficulty remembering appointments and medication schedules, often asking the same questions multiple times. Her daughter notes that her memory was intact prior to the fall.  She is currently undergoing chemotherapy for leukemia that requires lifelong treatment. She receives azacitidine  injections for seven days every month and takes venetoclax  for fifteen days each month. The chemotherapy schedule is sometimes delayed due to low neutrophil counts. She experiences soreness, irritability, and nausea following chemotherapy, for which she takes anti-nausea medication as needed.  She has sleeping difficulties, often sleeping only four to six hours per night, with a bedtime around 2 AM and waking around 11 AM. She sometimes naps during the day, especially during chemotherapy cycles. She denies taking melatonin due to intolerance.  She has a history of tremors and headaches, for which she sees a neurologist, Dr.  Bernetta Brilliant, and receives treatment. Her last appointment was two weeks ago. She also experiences dizziness, particularly when lying on her right side, and occasional buzzing sounds in her ear.  She reports overall mood symptoms are manageable on the current medication regimen.  She however has occasional hallucinations which she describes as voices unable to elaborate further.  She used to be on olanzapine  previously on a low dosage of 2.5 mg.  As per collateral information obtained from daughter who was present in session this may have been discontinued during one of her hospitalizations.  Agrees to discuss with oncologist tomorrow about appropriateness of restarting this medication.  She is currently taking hydroxyzine  as needed along with her amitriptyline  and that has been beneficial in managing her mood symptoms otherwise as per her daughter.  Patient currently denies any suicidality, homicidality.  An MMSE was completed during the session and patient scored 29 out of 30 on the same.  Patient however agrees to discuss with neurology regarding her most recent memory issues.    Visit Diagnosis:    ICD-10-CM   1. Schizoaffective disorder, depressive type (HCC)  F25.1     2. PTSD (post-traumatic stress disorder)  F43.10 amitriptyline  (ELAVIL ) 50 MG tablet    3. Generalized anxiety disorder  F41.1 amitriptyline  (ELAVIL ) 50 MG tablet    4. Drug-induced Parkinson's disease (HCC)  G21.19     5. Insomnia due to medical condition  G47.01    Anxiety, pain      Past Psychiatric History: I have reviewed past psychiatric history from progress note on 05/25/2017.  Past Medical History:  Past Medical History:  Diagnosis Date   Anxiety    Asthma    Chronic kidney disease    Chronic pain  previously saw Dr. Maurine Sovereign in pain clinic, then saw pain specialist in [facility]   Depression    Diabetes mellitus (HCC)    Frequency of urination    GERD (gastroesophageal reflux disease)    Headache(784.0)     High cholesterol    History of kidney stones    Hypertension    IBS (irritable bowel syndrome)    Left ankle instability    Left knee DJD    Lumbar Degenerative Disc Disease of  10/11/2014   Neuromuscular disorder (HCC)    Osteoarthritis of hip (Right) 05/05/2015   Other enthesopathy of ankle and tarsus 12/15/2009   Qualifier: Diagnosis of  By: Nolene Baumgarten MD, KARL     Parkinson's disease (HCC)    Peripheral sensory neuropathy (Bilateral) 11/19/2014   Postoperative nausea and vomiting    Schizophrenia (HCC)     Past Surgical History:  Procedure Laterality Date   ABDOMINAL HYSTERECTOMY     ANKLE SURGERY Left    x 2   APPENDECTOMY     CARDIAC CATHETERIZATION     COLONOSCOPY  2013   COLONOSCOPY WITH PROPOFOL  N/A 04/25/2017   Procedure: COLONOSCOPY WITH PROPOFOL ;  Surgeon: Cassie Click, MD;  Location: [facility] ENDOSCOPY;  Service: Endoscopy;  Laterality: N/A;   COLONOSCOPY WITH PROPOFOL  N/A 05/25/2022   Procedure: COLONOSCOPY WITH PROPOFOL ;  Surgeon: Shane Darling, MD;  Location: [facility] ENDOSCOPY;  Service: Endoscopy;  Laterality: N/A;   CYSTOSCOPY/URETEROSCOPY/HOLMIUM LASER/STENT PLACEMENT Left 09/11/2019   Procedure: CYSTOSCOPY/URETEROSCOPY/HOLMIUM LASER/STENT PLACEMENT;  Surgeon: Geraline Knapp, MD;  Location: [facility] ORS;  Service: Urology;  Laterality: Left;   ESOPHAGOGASTRODUODENOSCOPY (EGD) WITH PROPOFOL  N/A 10/03/2014   Procedure: ESOPHAGOGASTRODUODENOSCOPY (EGD) WITH PROPOFOL ;  Surgeon: Luella Sager, MD;  Location[facility] END[facility]COPY;  Service: Endoscopy;  Laterality: N/A;   ESOPHAGOGASTRODUODENOSCOPY (EGD) WITH PROPOFOL   04/25/2017   Procedure: ESOPHAGOGASTRODUODENOSCOPY (EGD) WITH PROPOFOL ;  Surgeon: Cassie Click, MD;  Location: [facility] ENDOSCOPY;  Service: Endoscopy;;   ESOPHAGOGASTRODUODENOSCOPY (EGD) WITH PROPOFOL  N/A 05/25/2022   Procedure: ESOPHAGOGASTRODUODENOSCOPY (EGD) WITH PROPOFOL ;  Surgeon: Shane Darling, MD;  Location: [facility] ENDOSCOPY;  Service:  Endoscopy;  Laterality: N/A;   IR BONE MARROW BIOPSY & ASPIRATION  01/03/2023   JOINT REPLACEMENT Left    knee   KNEE ARTHROSCOPY  1997   left knee   LEFT HEART CATH AND CORONARY ANGIOGRAPHY Left 11/10/2017   Procedure: LEFT HEART CATH AND CORONARY ANGIOGRAPHY;  Surgeon: Percival Brace, MD;  Location: [facility] INVASIVE CV LAB;  Service: Cardiovascular;  Laterality: Left;   LEFT HEART CATHETERIZATION WITH CORONARY ANGIOGRAM N/A 01/11/2013   Procedure: LEFT HEART CATHETERIZATION WITH CORONARY ANGIOGRAM;  Surgeon: Mickiel Albany, MD;  Location: [facility] CATH LAB;  Service: Cardiovascular;  Laterality: N/A;   TOTAL KNEE ARTHROPLASTY  08/30/2011   Procedure: TOTAL KNEE ARTHROPLASTY;  Surgeon: Genevie Kerns, MD;  Location: [facility] OR;  Service: Orthopedics;  Laterality: Left;    Family Psychiatric History: I have reviewed family psychiatric history from progress note on 05/25/2017.  Family History:  Family History  Problem Relation Age of Onset   Cancer Mother    Colon cancer Mother    Hypertension Father    Heart disease Father    Alcohol  abuse Father    Breast cancer Sister     Social History: I have reviewed social history from progress note on 05/25/2017. Social History   Socioeconomic History   Marital status: Divorced    Spouse name: Not on file   Number of children: 2  Years of education: 23   Highest education level: 11th grade  Occupational History    Employer: [employer]  Tobacco Use   Smoking status: Former    Current packs/day: 0.00    Average packs/day: 2.0 packs/day for 15.0 years (30.0 ttl pk-yrs)    Types: Cigarettes    Start date: 02/09/1980    Quit date: 02/09/1995    Years since quitting: 28.3   Smokeless tobacco: Never  Vaping Use   Vaping status: Never Used  Substance and Sexual Activity   Alcohol  use: No    Alcohol /week: 0.0 standard drinks of alcohol    Drug use: No   Sexual activity: Not Currently  Other Topics Concern   Not on file  Social History  Narrative   Patient lives at home alone. Patient works at [employer].   Caffeine daily- 2   Right handed.   Education- 11 th grade   Social Drivers of Corporate investment banker Strain: Low Risk  (04/04/2023)   Received from [facility]   Overall Financial Resource Strain (CARDIA)    Difficulty of Paying Living Expenses: Not hard at all  Recent Concern: Financial Resource Strain - Medium Risk (01/27/2023)   Received from [facility]   Overall Financial Resource Strain (CARDIA)    Difficulty of Paying Living Expenses: Somewhat hard  Food Insecurity: No Food Insecurity (04/04/2023)   Received from [facility]   Hunger Vital Sign    Worried About Running Out of Food in the Last Year: Never true    Ran Out of Food in the Last Year: Never true  Transportation Needs: No Transportation Needs (04/04/2023)   Received from [facility] - Transportation    In the past 12 months, has lack of transportation kept you from medical appointments or from getting medications?: No    Lack of Transportation (Non-Medical): No  Physical Activity: Inactive (01/27/2023)   Received from [facility]   Exercise Vital Sign    Minutes of Exercise per Session: 10 min    Days of Exercise per Week: 0 days  Stress: Stress Concern Present (01/27/2023)   Received from [facility] - Occupational Stress Questionnaire    Feeling of Stress : Rather much  Social Connections: Socially Isolated (03/06/2023)   Social Connection and Isolation Panel [NHANES]    Frequency of Communication with Friends and Family: More than three times a week    Frequency of Social Gatherings with Friends and Family: More than three times a week    Attends Religious Services: Never    Database administrator or Organizations: No    Attends Banker Meetings: Never    Marital Status: Divorced    Allergies:   Allergies  Allergen Reactions   Codeine Hives   Cymbalta [Duloxetine Hcl] Other (See Comments)    Altered mental status, Alopecia, visual hallucinations, nightmares   Gabapentin Swelling   Nsaids Anaphylaxis    REACTION: palpitations, diaphoresis  Reaction: Diaphoresis / Sweating (intolerance); AND CHEST PAINS   Other Reaction(s): Diaphoresis / Sweating (intolerance)    Reaction: Diaphoresis / Sweating (intolerance); AND CHEST PAINS     AND CHEST PAINS    "feel like I'm having a heart attack"   Prolixin  Decanoate [Fluphenazine ]     Ineffective    Benztropine  Mesylate Other (See Comments)   Buprenorphine  Hcl Other (See Comments)    Unable to  void   Morphine And Codeine Other (See Comments)    Unable to void   Other Other (See Comments)    "Anesthesia makes me sick"   Risperidone  And Related Other (See Comments)    tremors   Wellbutrin  [Bupropion ]     Constipation, mood swings   Benztropine  Other (See Comments)    Hair fall out  Suicide thoughts   Carbidopa -Levodopa  Nausea Only   Lyrica [Pregabalin] Other (See Comments)    Alopecia, visual hallucinations, nightmares Altered mental status   Nortripytline Hcl [Nortriptyline ] Other (See Comments)    Hair loss and night mares    Penicillins Nausea And Vomiting and Other (See Comments)    REACTION: upset stomach Has patient had a PCN reaction causing immediate rash, facial/tongue/throat swelling, SOB or lightheadedness with hypotension: No Has patient had a PCN reaction causing severe rash involving mucus membranes or skin necrosis: No Has patient had a PCN reaction that required hospitalization: No Has patient had a PCN reaction occurring within the last 10 years: No If all of the above answers are "NO", then may proceed with Cephalosporin use.   Tolmetin Other (See Comments) and Palpitations    REACTION: palpitations, diaphoresis    Metabolic Disorder Labs: Lab Results  Component Value Date   HGBA1C 7.0 (H)  03/06/2023   MPG 154.2 03/06/2023   MPG 131.24 03/06/2017   Lab Results  Component Value Date   PROLACTIN 4.5 (L) 06/13/2020   PROLACTIN 64.2 (H) 03/06/2017   Lab Results  Component Value Date   CHOL 146 08/22/2018   TRIG 172 (H) 08/22/2018   HDL 49 08/22/2018   CHOLHDL 3.0 08/22/2018   VLDL 34 08/22/2018   LDLCALC 63 08/22/2018   LDLCALC 59 03/06/2017   Lab Results  Component Value Date   TSH 0.798 03/07/2023   TSH 0.959 08/22/2018    Therapeutic Level Labs: No results found for: "LITHIUM" Lab Results  Component Value Date   VALPROATE 43 (L) 09/25/2022   No results found for: "CBMZ"  Current Medications: Current Outpatient Medications  Medication Sig Dispense Refill   ACCU-CHEK GUIDE test strip      amitriptyline  (ELAVIL ) 50 MG tablet TAKE  AT BEDTIME     azaCITIDine  (VIDAZA ) 100 MG SUSR Inject into the skin once.     B-D ULTRAFINE III SHORT PEN 31G X 8 MM MISC Inject into the skin.     buprenorphine  (BUTRANS ) 20 MCG/HR PTWK Place 1 patch onto the skin once a week.     carbidopa -levodopa  (SINEMET  IR) 25-100 MG tablet Take 1 tablet by mouth 3 (three) times daily.     hydrOXYzine  (ATARAX ) 50 MG tablet Take 0.5-1 tablets (25-50 mg total) by mouth 2 (two) times daily as needed. 60 tablet 4   insulin  aspart (FIASP  FLEXTOUCH) 100 UNIT/ML FlexTouch Pen Inject 10 Units into the skin daily in the afternoon. Takes 10 units at 10:00 am, 16 units at 4 pm, 10 units at 10 pm.     insulin  degludec (TRESIBA  FLEXTOUCH) 100 UNIT/ML FlexTouch Pen Inject 38 Units into the skin daily. At 10:00 pm     isosorbide  mononitrate (IMDUR ) 30 MG 24 hr tablet Take 30 mg by mouth daily.     levothyroxine  (SYNTHROID ) 75 MCG tablet Take 75 mcg by mouth See admin instructions. Monday-Friday. Takes 50 MCG tab on Saturdays and Sundays     magnesium  oxide (MAG-OX) 400 (240 Mg) MG tablet Take 800 mg by mouth daily.     metFORMIN  (GLUCOPHAGE ) 500 MG  tablet Take 1,000 mg by mouth 2 (two) times daily with a  meal.     metoprolol  tartrate (LOPRESSOR ) 25 MG tablet Take 25 mg by mouth 2 (two) times daily.     nitroGLYCERIN  (NITRODUR - DOSED IN MG/24 HR) 0.4 mg/hr patch Place 0.4 mg onto the skin as needed.     omeprazole  (PRILOSEC) 40 MG capsule Take 40 mg by mouth daily. Take 1 capsule 30 minutes before breakfast as needed on an empty stomach     ondansetron  (ZOFRAN ) 8 MG tablet Take 8 mg by mouth in the morning, at noon, and at bedtime.     oxyCODONE  (OXY IR/ROXICODONE ) 5 MG immediate release tablet Take 5-10 mg by mouth 2 (two) times daily as needed for moderate pain (pain score 4-6).     pravastatin  (PRAVACHOL ) 40 MG tablet Take 40 mg by mouth daily.     rOPINIRole  (REQUIP ) 0.5 MG tablet Take 0.5 mg by mouth 3 (three) times daily.     Semaglutide  3 MG TABS Take 3 mg by mouth daily.     senna (SENOKOT) 8.6 MG tablet Take 2 tablets by mouth 2 (two) times daily.     SUMAtriptan  (IMITREX ) 100 MG tablet Take 100 mg by mouth every 2 (two) hours as needed for migraine. May repeat in 2 hours if headache persists or recurs.     thiamine (VITAMIN B1) 100 MG tablet Take 100 mg by mouth daily.     torsemide  (DEMADEX ) 20 MG tablet Take 20 mg by mouth every other day.     valACYclovir (VALTREX) 500 MG tablet Take 500 mg by mouth daily.     venetoclax  (VENCLEXTA ) 100 MG tablet Take 400 mg by mouth daily. Tablets should be swallowed whole with a meal and a full glass of water.     levothyroxine  (SYNTHROID ) 50 MCG tablet Take 50 mcg by mouth See admin instructions. Saturdays and Sundays only. Takes 75 MCG tab Monday-Friday     No current facility-administered medications for this visit.     Musculoskeletal: Strength & Muscle Tone: within normal limits Gait & Station:  walks with cane Patient leans: N/A  Psychiatric Specialty Exam: Review of Systems  Psychiatric/Behavioral:  Positive for hallucinations and sleep disturbance.     Blood pressure 98/68, pulse (!) 103, temperature (!) 96.7 F (35.9 C),  temperature source Temporal, height 5\' 5"  (1.651 m), weight 212 lb 9.6 oz (96.4 kg).Body mass index is 35.38 kg/m.  General Appearance: Casual  Eye Contact:  Good  Speech:  Clear and Coherent  Volume:  Normal  Mood:  Euthymic  Affect:  Appropriate  Thought Process:  Goal Directed and Descriptions of Associations: Intact  Orientation:  Full (Time, Place, and Person)  Thought Content: Hallucinations: Auditory   Suicidal Thoughts:  No  Homicidal Thoughts:  No  Memory:  Immediate;   Fair Recent;   Fair Remote;   limited  Judgement:  Fair  Insight:  Fair  Psychomotor Activity:  Normal  Concentration:  Concentration: Fair and Attention Span: Fair  Recall:   limited  Fund of Knowledge: Fair  Language: Fair  Akathisia:  No  Handed:  Right  AIMS (if indicated): not done  Assets:  Desire for Improvement Housing Social Support Transportation  ADL's:  Intact  Cognition: WNL  Sleep:  Poor   Screenings: Geneticist, molecular Office Visit from 09/08/2022 in [facility] Office Visit from 04/27/2022 in [facility] Office Visit  from 03/16/2022 in [facility] Office Visit from 12/14/2021 in [facility] Office Visit from 08/28/2021 in [facility]  AIMS Total Score 5 0 0 0 0      AUDIT    Flowsheet Row Admission (Discharged) from OP Visit from 08/30/2018 [facility] Ad[facility] (Discharged) from [facility]  Alcohol  Use Disorder Identification Test Final Score (AUDIT) 0 0 0      GAD-7    Flowsheet Row Office Visit from 09/08/2022 in [facility] Office Visit from 04/27/2022 in [facility] Office Visit from 03/16/2022 in [facility] Office Visit from 12/14/2021 in [facility] Associates Office Visit from 08/28/2021 in [facility]  Total GAD-7 Score 5 5 5 8 5       PHQ2-9    Flowsheet Row Office Visit from 11/22/2022 in [facility] [place] Office Visit from 09/08/2022 in [facility] Office Visit from 04/27/2022 in [facility] Office Visit from 03/16/2022 in [facility] Office Visit from 12/14/2021 in [facility]  PHQ-2 Total Score 1 2 0 0 1  PHQ-9 Total Score -- 6 8 6  --      Flowsheet Row Office Visit from 06/08/2023 in [facility] Video Visit from 05/20/2023 in [facility] ED to Hosp-Admission (Discharged) from 03/06/2023 in [place] [facility] 4TH FLOOR PROGRESSIVE CARE AND UROLOGY  C-SSRS RISK CATEGORY No Risk No Risk No Risk        Assessment and Plan:Heather Haley is a 63 year old Caucasian female who has a history of schizoaffective disorder, PTSD, migraine headaches, acute myeloid leukemia was evaluated in office today, discussed assessment and plan as noted below.  Assessment & Plan Schizoaffective disorder-unstable Schizoaffective disorder with intermittent auditory hallucinations. Olanzapine  was previously discontinued during one of her most recent hospitalizations, potentially contributing to symptom recurrence. Hydroxyzine  effectively manages anxiety. Oncologist approval is required before restarting olanzapine  due to potential chemotherapy interactions. - Consult oncologist regarding restarting Olanzapine  2.5 mg at night for symptom management and  sleep. - Continue Hydroxyzine  25 mg-50 mg as needed for anxiety.  Generalized anxiety disorder-stable Anxiety managed with hydroxyzine  and amitriptyline , which is effective. Anxiety is related to her medical condition and treatment regimen. - Continue Hydroxyzine  25 mg as needed for anxiety. - Continue Amitriptyline  currently prescribed as 50 mg at bedtime. - Patient was previously prescribed 75 mg however as per review of medication list this was reduced to 50 mg currently prescribed by neurology.  PTSD-stable Currently denies any symptoms. - Will reevaluate in future sessions.  Insomnia-unstable Insomnia with difficulty initiating and maintaining sleep, possibly exacerbated by chemotherapy and anxiety. Reports 4-6 hours of sleep per night with occasional daytime naps affecting sleep hygiene. Olanzapine  may aid sleep if oncologist approves. - Consult oncologist regarding restarting Olanzapine  2.5 mg at night to aid sleep. - Consider alternative sleep medications if Olanzapine  is not approved.  Memory impairment post-head injury-unstable Memory impairment post-head injury with difficulty remembering appointments and medication schedules. Memory test today showed no significant deficits, scoring 29/30. Memory issues may relate to head  injury and stress from managing multiple conditions. Organizational strategies are being implemented. - Refer to neurologist for further evaluation of memory issues.  Patient currently has a neurologist and agrees to follow-up. - Implement organizational strategies such as using a whiteboard for reminders and a pillbox for medication management. - MMSE completed today 29/30   Collateral information obtained from daughter, Ms. Bridgette Campus who was present in session today.  Follow-up Follow-up in clinic in 4 to 8 weeks or sooner if needed.  Collaboration of Care: Collaboration of Care: Other patient is to follow up with neurology as well as  oncology  Patient/Guardian was advised Release of Information must be obtained prior to any record release in order to collaborate their care with an outside provider. Patient/Guardian was advised if they have not already done so to contact the registration department to sign all necessary forms in order for us  to release information regarding their care.   Consent: Patient/Guardian gives verbal consent for treatment and assignment of benefits for services provided during this visit. Patient/Guardian expressed understanding and agreed to proceed.   This note was generated in part or whole with voice recognition software. Voice recognition is usually quite accurate but there are transcription errors that can and very often do occur. I apologize for any typographical errors that were not detected and corrected.    Masud Holub, MD 06/10/2023, 8:06 AM

## 2023-06-08 NOTE — Patient Instructions (Signed)
 Olanzapine  Tablets What is this medication? OLANZAPINE  (oh LAN za peen) treats schizophrenia and bipolar disorder. It works by balancing the levels of dopamine and serotonin in your brain, substances that help regulate mood, behaviors, and thoughts. It belongs to a group of medications called antipsychotics. Antipsychotic medications can be used to treat several kinds of mental health conditions. This medicine may be used for other purposes; ask your health care provider or pharmacist if you have questions. COMMON BRAND NAME(S): Zyprexa  What should I tell my care team before I take this medication? They need to know if you have any of these conditions: Bowel blockage Constipation Dementia Diabetes Glaucoma Have trouble controlling your muscles Heart disease High cholesterol High levels of prolactin History of breast cancer History of irregular heartbeat History of stroke Liver disease Low blood cell levels (white cells, red cells, and platelets) Low blood pressure Parkinson disease Prostate disease Seizures Suicidal thoughts, plans, or attempt by you or a family member Tobacco use Trouble passing urine Trouble swallowing An unusual or allergic reaction to olanzapine , other medications, foods, dyes, or preservatives Pregnant or trying to get pregnant Breastfeeding How should I use this medication? Take this medication by mouth. Swallow it with a drink of water. Follow the directions on the prescription label. Take your medication at regular intervals. Do not take it more often than directed. Do not stop taking except on the advice of your care team. A special MedGuide will be given to you by the pharmacist with each new prescription and refill. Be sure to read this information carefully each time. Talk to your care team about the use of this medication in children. While this medication may be prescribed for children as young as 13 years for selected conditions, precautions do  apply. Overdosage: If you think you have taken too much of this medicine contact a poison control center or emergency room at once. NOTE: This medicine is only for you. Do not share this medicine with others. What if I miss a dose? If you miss a dose, take it as soon as you can. If it is almost time for your next dose, take only that dose. Do not take double or extra doses. What may interact with this medication? Do not take this medication with any of the following: Cisapride Dronedarone Ketoconazole Levoketoconazole Metoclopramide  Pimozide Thioridazine This medication may also interact with the following: Alcohol  Benzodiazepines, such as alprazolam , diazepam , lorazepam  Certain antihistamines Certain medications for depression, such as amitriptyline  or trazodone  Certain medications for seizures, such as phenobarbital or primidone Medications that cause drowsiness before a procedure, such as propofol  Medications that help you fall asleep Medications that relax muscles Opioids for pain or cough Other medications that cause heart rhythm changes Phenothiazines, such as chlorpromazine or prochlorperazine This list may not describe all possible interactions. Give your health care provider a list of all the medicines, herbs, non-prescription drugs, or dietary supplements you use. Also tell them if you smoke, drink alcohol , or use illegal drugs. Some items may interact with your medicine. What should I watch for while using this medication? Visit your care team for regular checks on your progress. Tell your care team if your symptoms do not start to get better or if they get worse. Do not suddenly stop taking this medication. You may develop a severe reaction. Your care team will tell you how much medication to take. If your care team wants you to stop the medication, the dose may be slowly lowered over time to avoid  any side effects. This medication may cause serious skin reactions. They can  happen weeks to months after starting the medication. Contact your care team right away if you notice fevers or flu-like symptoms with a rash. The rash may be red or purple and then turn into blisters or peeling of the skin. You may also notice a red rash with swelling of the face, lips, or lymph nodes in your neck or under your arms. This medication may affect your coordination, reaction time, or judgment. Do not drive or operate machinery until you know how this medication affects you. Sit up or stand slowly to reduce the risk of dizzy or fainting spells. Drinking alcohol  with this medication can increase the risk of these side effects. Your mouth may get dry. Chewing sugarless gum or sucking hard candy and drinking plenty of water may help. This medication can cause problems with controlling your body temperature. It can lower the response of your body to cold temperatures. If possible, stay indoors during cold weather. If you must go outdoors, wear warm clothes. It can also lower the response of your body to heat. Do not overheat. Do not over-exercise. Stay out of the sun when possible. If you must be in the sun, wear cool clothing. Drink plenty of water. If you have trouble controlling your body temperature, call your care team right away. This medication may increase blood sugar. If you have diabetes, ask your care team if changes in diet or medications are needed. If you smoke, tell your care team if you notice this medication is not working well for you. Talk to your care team if you are a smoker or if you decide to stop smoking. What side effects may I notice from receiving this medication? Side effects that you should report to your care team as soon as possible: Allergic reactions--skin rash, itching, hives, swelling of the face, lips, tongue, or throat High blood sugar (hyperglycemia)--increased thirst or amount of urine, unusual weakness or fatigue, blurry vision High fever, stiff muscles,  increased sweating, fast or irregular heartbeat, and confusion, which may be signs of neuroleptic malignant syndrome High prolactin level--unexpected breast tissue growth, discharge from the nipple, change in sex drive or performance, irregular menstrual cycle Infection--fever, chills, cough, or sore throat Low blood pressure--dizziness, feeling faint or lightheaded, blurry vision Pain or trouble swallowing Seizures Stroke--sudden numbness or weakness of the face, arm, or leg, trouble speaking, confusion, trouble walking, loss of balance or coordination, dizziness, severe headache, change in vision Thoughts of suicide or self-harm, worsening mood, feelings of depression Trouble passing urine Uncontrolled and repetitive body movements, muscle stiffness or spasms, tremors or shaking, loss of balance or coordination, restlessness, shuffling walk, which may be signs of extrapyramidal symptoms (EPS) Side effects that usually do not require medical attention (report to your care team if they continue or are bothersome): Constipation Dizziness Drowsiness Dry mouth Headache Upset stomach Weight gain This list may not describe all possible side effects. Call your doctor for medical advice about side effects. You may report side effects to FDA at 1-800-FDA-1088. Where should I keep my medication? Keep out of the reach of children. Store at controlled room temperature between 15 and 30 degrees C (59 and 86 degrees F). Protect from light and moisture. Throw away any unused medication after the expiration date. NOTE: This sheet is a summary. It may not cover all possible information. If you have questions about this medicine, talk to your doctor, pharmacist, or health care provider.  2024 Elsevier/Gold Standard (2022-08-14 00:00:00)

## 2023-06-09 DIAGNOSIS — C9201 Acute myeloblastic leukemia, in remission: Secondary | ICD-10-CM | POA: Diagnosis not present

## 2023-06-09 DIAGNOSIS — Z5111 Encounter for antineoplastic chemotherapy: Secondary | ICD-10-CM | POA: Diagnosis not present

## 2023-06-09 DIAGNOSIS — Z885 Allergy status to narcotic agent status: Secondary | ICD-10-CM | POA: Diagnosis not present

## 2023-06-09 DIAGNOSIS — I2510 Atherosclerotic heart disease of native coronary artery without angina pectoris: Secondary | ICD-10-CM | POA: Diagnosis not present

## 2023-06-09 DIAGNOSIS — D696 Thrombocytopenia, unspecified: Secondary | ICD-10-CM | POA: Diagnosis not present

## 2023-06-09 DIAGNOSIS — R110 Nausea: Secondary | ICD-10-CM | POA: Diagnosis not present

## 2023-06-09 DIAGNOSIS — M549 Dorsalgia, unspecified: Secondary | ICD-10-CM | POA: Diagnosis not present

## 2023-06-09 DIAGNOSIS — G20A1 Parkinson's disease without dyskinesia, without mention of fluctuations: Secondary | ICD-10-CM | POA: Diagnosis not present

## 2023-06-09 DIAGNOSIS — K219 Gastro-esophageal reflux disease without esophagitis: Secondary | ICD-10-CM | POA: Diagnosis not present

## 2023-06-09 DIAGNOSIS — F209 Schizophrenia, unspecified: Secondary | ICD-10-CM | POA: Diagnosis not present

## 2023-06-09 DIAGNOSIS — C9200 Acute myeloblastic leukemia, not having achieved remission: Secondary | ICD-10-CM | POA: Diagnosis not present

## 2023-06-09 DIAGNOSIS — G8929 Other chronic pain: Secondary | ICD-10-CM | POA: Diagnosis not present

## 2023-06-10 DIAGNOSIS — Z5111 Encounter for antineoplastic chemotherapy: Secondary | ICD-10-CM | POA: Diagnosis not present

## 2023-06-10 DIAGNOSIS — C9201 Acute myeloblastic leukemia, in remission: Secondary | ICD-10-CM | POA: Diagnosis not present

## 2023-06-10 MED ORDER — OLANZAPINE 2.5 MG PO TABS: 2.5000 mg | ORAL_TABLET | Freq: Every day | ORAL | 0 refills | Status: DC

## 2023-06-11 DIAGNOSIS — Z5111 Encounter for antineoplastic chemotherapy: Secondary | ICD-10-CM | POA: Diagnosis not present

## 2023-06-11 DIAGNOSIS — C9201 Acute myeloblastic leukemia, in remission: Secondary | ICD-10-CM | POA: Diagnosis not present

## 2023-06-12 DIAGNOSIS — C9201 Acute myeloblastic leukemia, in remission: Secondary | ICD-10-CM | POA: Diagnosis not present

## 2023-06-12 DIAGNOSIS — Z5111 Encounter for antineoplastic chemotherapy: Secondary | ICD-10-CM | POA: Diagnosis not present

## 2023-06-13 DIAGNOSIS — Z5111 Encounter for antineoplastic chemotherapy: Secondary | ICD-10-CM | POA: Diagnosis not present

## 2023-06-13 DIAGNOSIS — C9201 Acute myeloblastic leukemia, in remission: Secondary | ICD-10-CM | POA: Diagnosis not present

## 2023-06-20 ENCOUNTER — Inpatient Hospital Stay: Attending: Oncology | Admitting: Oncology

## 2023-06-20 ENCOUNTER — Encounter: Payer: Self-pay | Admitting: Oncology

## 2023-06-20 VITALS — BP 117/66 | HR 68 | Temp 96.0°F | Resp 18 | Wt 220.1 lb

## 2023-06-20 DIAGNOSIS — Z87442 Personal history of urinary calculi: Secondary | ICD-10-CM | POA: Diagnosis not present

## 2023-06-20 DIAGNOSIS — Z800 Family history of malignant neoplasm of digestive organs: Secondary | ICD-10-CM | POA: Diagnosis not present

## 2023-06-20 DIAGNOSIS — Z87891 Personal history of nicotine dependence: Secondary | ICD-10-CM | POA: Diagnosis not present

## 2023-06-20 DIAGNOSIS — Z9049 Acquired absence of other specified parts of digestive tract: Secondary | ICD-10-CM | POA: Insufficient documentation

## 2023-06-20 DIAGNOSIS — Z888 Allergy status to other drugs, medicaments and biological substances status: Secondary | ICD-10-CM | POA: Diagnosis not present

## 2023-06-20 DIAGNOSIS — F209 Schizophrenia, unspecified: Secondary | ICD-10-CM | POA: Diagnosis not present

## 2023-06-20 DIAGNOSIS — C9201 Acute myeloblastic leukemia, in remission: Secondary | ICD-10-CM | POA: Diagnosis not present

## 2023-06-20 DIAGNOSIS — G20A1 Parkinson's disease without dyskinesia, without mention of fluctuations: Secondary | ICD-10-CM | POA: Insufficient documentation

## 2023-06-20 DIAGNOSIS — Z803 Family history of malignant neoplasm of breast: Secondary | ICD-10-CM | POA: Diagnosis not present

## 2023-06-20 DIAGNOSIS — Z809 Family history of malignant neoplasm, unspecified: Secondary | ICD-10-CM | POA: Diagnosis not present

## 2023-06-20 DIAGNOSIS — F419 Anxiety disorder, unspecified: Secondary | ICD-10-CM | POA: Insufficient documentation

## 2023-06-20 DIAGNOSIS — R110 Nausea: Secondary | ICD-10-CM | POA: Diagnosis not present

## 2023-06-20 DIAGNOSIS — Z7969 Long term (current) use of other immunomodulators and immunosuppressants: Secondary | ICD-10-CM | POA: Diagnosis not present

## 2023-06-20 DIAGNOSIS — Z79624 Long term (current) use of inhibitors of nucleotide synthesis: Secondary | ICD-10-CM | POA: Diagnosis not present

## 2023-06-20 DIAGNOSIS — Z811 Family history of alcohol abuse and dependence: Secondary | ICD-10-CM | POA: Insufficient documentation

## 2023-06-20 DIAGNOSIS — C9200 Acute myeloblastic leukemia, not having achieved remission: Secondary | ICD-10-CM | POA: Insufficient documentation

## 2023-06-20 DIAGNOSIS — Z885 Allergy status to narcotic agent status: Secondary | ICD-10-CM | POA: Diagnosis not present

## 2023-06-20 DIAGNOSIS — Z880 Allergy status to penicillin: Secondary | ICD-10-CM | POA: Diagnosis not present

## 2023-06-20 DIAGNOSIS — Z79899 Other long term (current) drug therapy: Secondary | ICD-10-CM | POA: Insufficient documentation

## 2023-06-20 DIAGNOSIS — E7800 Pure hypercholesterolemia, unspecified: Secondary | ICD-10-CM | POA: Insufficient documentation

## 2023-06-20 DIAGNOSIS — Z8249 Family history of ischemic heart disease and other diseases of the circulatory system: Secondary | ICD-10-CM | POA: Insufficient documentation

## 2023-06-20 DIAGNOSIS — Z886 Allergy status to analgesic agent status: Secondary | ICD-10-CM | POA: Insufficient documentation

## 2023-06-20 DIAGNOSIS — Z90710 Acquired absence of both cervix and uterus: Secondary | ICD-10-CM | POA: Diagnosis not present

## 2023-06-20 NOTE — Progress Notes (Signed)
 Hematology/Oncology Progress note [phone] [phone]     CHIEF COMPLAINTS/REASON FOR VISIT:  AML in remission.   ASSESSMENT & PLAN:   AML (acute myeloblastic leukemia) (HCC) AML on chemotherapy with Azacitadine and Venetoclax .  Plan 07/11/2023 cycle 5  azacitidine  75mg /m2 x 5 days  / ventoclax 400mg  daily. D1-7 if her ANC >=1, and platelet >=50,000. Chemotherapy education.  Valtrex for virus prophylaxis.    Nausea without vomiting Continue ondansetron  8 mg orally up to every 8 hours as needed for nausea     Orders Placed This Encounter  Procedures   Consent Attestation for Oncology Treatment    The patient is informed of risks, benefits, side-effects of the prescribed oncology treatment. Potential short term and long term side effects and response rates discussed. After a long discussion, the patient made informed decision to proceed.:   Yes   CMP (Cancer Center only)    Standing Status:   Future    Expected Date:   07/11/2023    Expiration Date:   07/10/2024   CBC with Differential (Cancer Center Only)    Standing Status:   Future    Expected Date:   07/11/2023    Expiration Date:   07/10/2024   We spent sufficient time to discuss many aspect of care, questions were answered to patient's satisfaction. A total of 25 minutes was spent on this visit.  With 10 minutes spent reviewing lab and bon marrow biopsy pathology reports, findings, [facility] Oncology recommendation, 10 minutes counseling the patient the treatment, management of symptoms.  Additional 5 minutes was spent on answering patient's questions.  All questions were answered. The patient knows to call the clinic with any problems, questions or concerns.  [name], MD, PhD [facility] Hematology Oncology 06/20/2023   HISTORY OF PRESENTING ILLNESS:  Heather Haley is a 63 y.o. female presents to re- establish care for AML treatments.  01/03/2023 bone marrow biopsy  Bone marrow biopsy showed -  Hypercellular bone marrow (80%) with erythroid predominant trilineage hematopoiesis, increased blasts [5-10% by CD34 IHC, and 9% by flow cytometric analysis and multilineage dysplasia. Consistent with MDS-IB1  cytogenetics is positive for  t(6;9)(p22;q34)/DEK-NUP214   She was referred to [facility] and there she had repeat marrow testing that confirmed AML with 20% blasts and t(6;9)/DEK:NUP214 fusion.   She enrolled on BAML S12 protocol 02/21/2023 Cycle 1 azacitidine  75mg /m2 x 7 days  / ventoclax 400mg  daily. D1-14 03/17/2023 Day 21 bone marrow biopsy showed 1% blast cells  03/24/2023 Cycle 2 azacitidine  75mg /m2 x 7 days  / ventoclax 400mg  daily. D1-14 repeat marrow following cycle 2 and she remains in remission with no minimal residual disease.   05/05/2023 Cycle 3 azacitidine  75mg /m2 x 5 days  / ventoclax 400mg  daily. D1-14 chemo was delayed due to neutropenia  06/09/2023 Cycle 4 azacitidine  75mg /m2 x 5 days  / ventoclax 400mg  daily. D1-7, delay due to neutropenia.   She reports feeling well and expresses desire of receiving Azacitidine  treatments at our cancer center as this is close to her home.  Accompanied by her daughter today.  Patient reports feeling well, no nausea vomiting, diarrhea, fever or chills.    MEDICAL HISTORY:  Past Medical History:  Diagnosis Date   Anxiety    Asthma    Chronic kidney disease    Chronic pain    previously saw Dr. Maurine Sovereign in pain clinic, then saw pain specialist in [facility]   Depression    Diabetes mellitus (HCC)    Frequency of  urination    GERD (gastroesophageal reflux disease)    Headache(784.0)    High cholesterol    History of kidney stones    Hypertension    IBS (irritable bowel syndrome)    Left ankle instability    Left knee DJD    Lumbar Degenerative Disc Disease of  10/11/2014   Neuromuscular disorder (HCC)    Osteoarthritis of hip (Right) 05/05/2015   Other enthesopathy of ankle and tarsus 12/15/2009   Qualifier: Diagnosis of  By: Nolene Baumgarten MD,  KARL     Parkinson's disease (HCC)    Peripheral sensory neuropathy (Bilateral) 11/19/2014   Postoperative nausea and vomiting    Schizophrenia (HCC)     SURGICAL HISTORY: Past Surgical History:  Procedure Laterality Date   ABDOMINAL HYSTERECTOMY     ANKLE SURGERY Left    x 2   APPENDECTOMY     CARDIAC CATHETERIZATION     COLONOSCOPY  2013   COLONOSCOPY WITH PROPOFOL  N/A 04/25/2017   Procedure: COLONOSCOPY WITH PROPOFOL ;  Surgeon: Cassie Click, MD;  Location: [facility] ENDOSCOPY;  Service: Endoscopy;  Laterality: N/A;   COLONOSCOPY WITH PROPOFOL  N/A 05/25/2022   Procedure: COLONOSCOPY WITH PROPOFOL ;  Surgeon: Shane Darling, MD;  Location: [facility] ENDOSCOPY;  Service: Endoscopy;  Laterality: N/A;   CYSTOSCOPY/URETEROSCOPY/HOLMIUM LASER/STENT PLACEMENT Left 09/11/2019   Procedure: CYSTOSCOPY/URETEROSCOPY/HOLMIUM LASER/STENT PLACEMENT;  Surgeon: Geraline Knapp, MD;  Location: [facility] ORS;  Service: Urology;  Laterality: Left;   ESOPHAGOGASTRODUODENOSCOPY (EGD) WITH PROPOFOL  N/A 10/03/2014   Procedure: ESOPHAGOGASTRODUODENOSCOPY (EGD) WITH PROPOFOL ;  Surgeon: Luella Sager, MD;  Location: [facility] ENDOSCOPY;  Service: Endoscopy;  Laterality: N/A;   ESOPHAGOGASTRODUODENOSCOPY (EGD) WITH PROPOFOL   04/25/2017   Procedure: ESOPHAGOGASTRODUODENOSCOPY (EGD) WITH PROPOFOL ;  Surgeon: Cassie Click, MD;  Location: [facility] ENDOSCOPY;  Service: Endoscopy;;   ESOPHAGOGASTRODUODENOSCOPY (EGD) WITH PROPOFOL  N/A 05/25/2022   Procedure: ESOPHAGOGASTRODUODENOSCOPY (EGD) WITH PROPOFOL ;  Surgeon: Shane Darling, MD;  Location: [facility] ENDOSCOPY;  Service: Endoscopy;  Laterality: N/A;   IR BONE MARROW BIOPSY & ASPIRATION  01/03/2023   JOINT REPLACEMENT Left    knee   KNEE ARTHROSCOPY  1997   left knee   LEFT HEART CATH AND CORONARY ANGIOGRAPHY Left 11/10/2017   Procedure: LEFT HEART CATH AND CORONARY ANGIOGRAPHY;  Surgeon: Percival Brace, MD;  Location: [facility] INVASIVE CV LAB;  Service:  Cardiovascular;  Laterality: Left;   LEFT HEART CATHETERIZATION WITH CORONARY ANGIOGRAM N/A 01/11/2013   Procedure: LEFT HEART CATHETERIZATION WITH CORONARY ANGIOGRAM;  Surgeon: Mickiel Albany, MD;  Location: [facility] CATH LAB;  Service: Cardiovascular;  Laterality: N/A;   TOTAL KNEE ARTHROPLASTY  08/30/2011   Procedure: TOTAL KNEE ARTHROPLASTY;  Surgeon: Genevie Kerns, MD;  Location: [facility] OR;  Service: Orthopedics;  Laterality: Left;    SOCIAL HISTORY: Social History   Socioeconomic History   Marital status: Divorced    Spouse name: Not on file   Number of children: 2   Years of education: 11   Highest education level: 11th grade  Occupational History    Employer: [employer]  Tobacco Use   Smoking status: Former    Current packs/day: 0.00    Average packs/day: 2.0 packs/day for 15.0 years (30.0 ttl pk-yrs)    Types: Cigarettes    Start date: 02/09/1980    Quit date: 02/09/1995    Years since quitting: 28.3   Smokeless tobacco: Never  Vaping Use   Vaping status: Never Used  Substance and Sexual Activity   Alcohol  use: No  Alcohol /week: 0.0 standard drinks of alcohol    Drug use: No   Sexual activity: Not Currently  Other Topics Concern   Not on file  Social History Narrative   Patient lives at home alone. Patient works at [employer].   Caffeine daily- 2   Right handed.   Education- 11 th grade   Social Drivers of Corporate investment banker Strain: Low Risk  (04/04/2023)   Received from [facility]   Overall Financial Resource Strain (CARDIA)    Difficulty of Paying Living Expenses: Not hard at all  Recent Concern: Financial Resource Strain - Medium Risk (01/27/2023)   Received from [facility]   Overall Financial Resource Strain (CARDIA)    Difficulty of Paying Living Expenses: Somewhat hard  Food Insecurity: No Food Insecurity (04/04/2023)   Received from [facility]   Hunger Vital Sign    Worried About Running Out of  Food in the Last Year: Never true    Ran Out of Food in the Last Year: Never true  Transportation Needs: No Transportation Needs (04/04/2023)   Received from [facility] - Transportation    In the past 12 months, has lack of transportation kept you from medical appointments or from getting medications?: No    Lack of Transportation (Non-Medical): No  Physical Activity: Inactive (01/27/2023)   Received from [facility]   Exercise Vital Sign    Minutes of Exercise per Session: 10 min    Days of Exercise per Week: 0 days  Stress: Stress Concern Present (01/27/2023)   Received from [facility] - Occupational Stress Questionnaire    Feeling of Stress : Rather much  Social Connections: Socially Isolated (03/06/2023)   Social Connection and Isolation Panel [NHANES]    Frequency of Communication with Friends and Family: More than three times a week    Frequency of Social Gatherings with Friends and Family: More than three times a week    Attends Religious Services: Never    Database administrator or Organizations: No    Attends Banker Meetings: Never    Marital Status: Divorced  Catering manager Violence: Not At Risk (03/06/2023)   Humiliation, Afraid, Rape, and Kick questionnaire    Fear of Current or Ex-Partner: No    Emotionally Abused: No    Physically Abused: No    Sexually Abused: No    FAMILY HISTORY: Family History  Problem Relation Age of Onset   Cancer Mother    Colon cancer Mother    Hypertension Father    Heart disease Father    Alcohol  abuse Father    Breast cancer Sister     ALLERGIES:  is allergic to codeine, cymbalta [duloxetine hcl], gabapentin, nsaids, prolixin  decanoate [fluphenazine ], benztropine  mesylate, buprenorphine  hcl, morphine and codeine, other, risperidone  and related, wellbutrin  [bupropion ], benztropine , carbidopa -levodopa , lyrica [pregabalin], nortripytline hcl  [nortriptyline ], penicillins, and tolmetin.  MEDICATIONS:  Current Outpatient Medications  Medication Sig Dispense Refill   ACCU-CHEK GUIDE test strip      amitriptyline  (ELAVIL ) 50 MG tablet TAKE  AT BEDTIME     azaCITIDine  (VIDAZA ) 100 MG SUSR Inject into the skin once.     B-D ULTRAFINE III SHORT PEN 31G X 8 MM MISC Inject into the skin.     buprenorphine  (BUTRANS ) 20 MCG/HR PTWK Place 1 patch onto the skin once a week.     carbidopa -levodopa  (SINEMET  IR)  25-100 MG tablet Take 1 tablet by mouth 3 (three) times daily.     hydrOXYzine  (ATARAX ) 50 MG tablet Take 0.5-1 tablets (25-50 mg total) by mouth 2 (two) times daily as needed. 60 tablet 4   insulin  aspart (FIASP  FLEXTOUCH) 100 UNIT/ML FlexTouch Pen Inject 10 Units into the skin daily in the afternoon. Takes 10 units at 10:00 am, 16 units at 4 pm, 10 units at 10 pm.     insulin  degludec (TRESIBA  FLEXTOUCH) 100 UNIT/ML FlexTouch Pen Inject 38 Units into the skin daily. At 10:00 pm     isosorbide  mononitrate (IMDUR ) 30 MG 24 hr tablet Take 30 mg by mouth daily.     levothyroxine  (SYNTHROID ) 50 MCG tablet Take 50 mcg by mouth See admin instructions. Saturdays and Sundays only. Takes 75 MCG tab Monday-Friday     levothyroxine  (SYNTHROID ) 75 MCG tablet Take 75 mcg by mouth See admin instructions. Monday-Friday. Takes 50 MCG tab on Saturdays and Sundays     magnesium  oxide (MAG-OX) 400 (240 Mg) MG tablet Take 800 mg by mouth daily.     metFORMIN  (GLUCOPHAGE ) 500 MG tablet Take 1,000 mg by mouth 2 (two) times daily with a meal.     metoprolol  tartrate (LOPRESSOR ) 25 MG tablet Take 25 mg by mouth 2 (two) times daily.     nitroGLYCERIN  (NITRODUR - DOSED IN MG/24 HR) 0.4 mg/hr patch Place 0.4 mg onto the skin as needed.     OLANZapine  (ZYPREXA ) 2.5 MG tablet Take 1 tablet (2.5 mg total) by mouth at bedtime. 90 tablet 0   omeprazole  (PRILOSEC) 40 MG capsule Take 40 mg by mouth daily. Take 1 capsule 30 minutes before breakfast as needed on an empty  stomach     ondansetron  (ZOFRAN ) 8 MG tablet Take 8 mg by mouth in the morning, at noon, and at bedtime.     oxyCODONE  (OXY IR/ROXICODONE ) 5 MG immediate release tablet Take 5-10 mg by mouth 2 (two) times daily as needed for moderate pain (pain score 4-6).     pravastatin  (PRAVACHOL ) 40 MG tablet Take 40 mg by mouth daily.     rOPINIRole  (REQUIP ) 0.5 MG tablet Take 0.5 mg by mouth 3 (three) times daily.     Semaglutide  3 MG TABS Take 3 mg by mouth daily.     senna (SENOKOT) 8.6 MG tablet Take 2 tablets by mouth 2 (two) times daily.     SUMAtriptan  (IMITREX ) 100 MG tablet Take 100 mg by mouth every 2 (two) hours as needed for migraine. May repeat in 2 hours if headache persists or recurs.     thiamine (VITAMIN B1) 100 MG tablet Take 100 mg by mouth daily.     torsemide  (DEMADEX ) 20 MG tablet Take 20 mg by mouth every other day.     valACYclovir (VALTREX) 500 MG tablet Take 500 mg by mouth daily.     venetoclax  (VENCLEXTA ) 100 MG tablet Take 400 mg by mouth daily. Tablets should be swallowed whole with a meal and a full glass of water.     No current facility-administered medications for this visit.    Review of Systems  Constitutional:  Negative for appetite change, chills, fatigue and fever.  HENT:   Negative for hearing loss and voice change.   Eyes:  Negative for eye problems.  Respiratory:  Negative for chest tightness and cough.   Cardiovascular:  Negative for chest pain.  Gastrointestinal:  Negative for abdominal distention, abdominal pain and blood in stool.  Endocrine: Negative  for hot flashes.  Genitourinary:  Negative for difficulty urinating and frequency.   Musculoskeletal:  Negative for arthralgias.  Skin:  Negative for itching and rash.  Neurological:  Negative for extremity weakness.  Hematological:  Negative for adenopathy.  Psychiatric/Behavioral:  Negative for confusion.     PHYSICAL EXAMINATION: ECOG PERFORMANCE STATUS: 1 - Symptomatic but completely  ambulatory Vitals:   06/20/23 1359  BP: 117/66  Pulse: 68  Resp: 18  Temp: (!) 96 F (35.6 C)  SpO2: 98%   Filed Weights   06/20/23 1359  Weight: 220 lb 1.6 oz (99.8 kg)    Physical Exam Constitutional:      General: She is not in acute distress.    Appearance: She is obese.  HENT:     Head: Normocephalic and atraumatic.  Eyes:     General: No scleral icterus. Cardiovascular:     Rate and Rhythm: Normal rate.  Pulmonary:     Effort: Pulmonary effort is normal. No respiratory distress.  Abdominal:     General: There is no distension.  Musculoskeletal:        General: No deformity. Normal range of motion.     Cervical back: Normal range of motion and neck supple.  Skin:    Findings: No rash.  Neurological:     Mental Status: She is alert and oriented to person, place, and time. Mental status is at baseline.  Psychiatric:        Mood and Affect: Mood normal.      LABORATORY DATA:  I have reviewed the data as listed    Latest Ref Rng & Units 03/10/2023    6:43 AM 03/09/2023    5:52 AM 03/08/2023    7:09 AM  CBC  WBC 4.0 - 10.5 K/uL 0.9  0.8  0.8   Hemoglobin 12.0 - 15.0 g/dL 8.5  8.0  6.7   Hematocrit 36.0 - 46.0 % 28.3  27.5  22.4   Platelets 150 - 400 K/uL 129  69  65       Latest Ref Rng & Units 03/10/2023    6:43 AM 03/09/2023    5:52 AM 03/08/2023    5:26 AM  CMP  Glucose 70 - 99 mg/dL 161  096  045   BUN 8 - 23 mg/dL 14  16  20    Creatinine 0.44 - 1.00 mg/dL 4.09  8.11  9.14   Sodium 135 - 145 mmol/L 133  136  131   Potassium 3.5 - 5.1 mmol/L 5.0  4.7  3.1   Chloride 98 - 111 mmol/L 99  100  92   CO2 22 - 32 mmol/L 26  27  28    Calcium 8.9 - 10.3 mg/dL 8.6  7.8  7.4   Total Protein 6.5 - 8.1 g/dL 7.1  6.5  6.4   Total Bilirubin 0.0 - 1.2 mg/dL 0.9  0.8  1.2   Alkaline Phos 38 - 126 U/L 96  81  74   AST 15 - 41 U/L 18  19  19    ALT 0 - 44 U/L 7  11  6       RADIOGRAPHIC STUDIES: I have personally reviewed the radiological images as listed and  agreed with the findings in the report.  No results found.

## 2023-06-20 NOTE — Assessment & Plan Note (Signed)
 Continue ondansetron  8 mg orally up to every 8 hours as needed for nausea

## 2023-06-20 NOTE — Addendum Note (Signed)
 Addended by: [name] on: 06/20/2023 09:34 PM   Modules accepted: Orders

## 2023-06-20 NOTE — Progress Notes (Signed)
 START OFF PATHWAY REGIMEN - AML and APL   [id number]:Azacitidine  75 mg/m2 IV D1-7 + Venetoclax  400 mg PO Daily D1-28 q28 Days:   A cycle is every 28 days:     Venetoclax       Azacitidine    **Always confirm dose/schedule in your pharmacy ordering system**  Patient Characteristics: AML, Initial Induction Therapy, Candidate for Intensive Induction Therapy and Intensive Therapy Preferred, Not Favorable Risk Disease, FLT3 Mutation Absent, and TP53 Mutation Absent, Intermediate Risk Disease Subtype: AML Phase of Therapy: Initial Induction Therapy Preferred Therapy Intensity: Candidate for Intensive Therapy and Intensive Therapy Preferred Disease Characteristics: Not Favorable Risk Disease, FLT3 Mutation Absent, and TP53 Mutation Absent Risk Status: Intermediate Risk Intent of Therapy: Curative Intent, Discussed with Patient

## 2023-06-20 NOTE — Assessment & Plan Note (Addendum)
 AML on chemotherapy with Azacitadine and Venetoclax .  Plan 07/11/2023 cycle 5  azacitidine  75mg /m2 x 5 days  / ventoclax 400mg  daily. D1-7 if her ANC >=1, and platelet >=50,000. Chemotherapy education.  Valtrex for virus prophylaxis.

## 2023-06-21 ENCOUNTER — Other Ambulatory Visit: Payer: Self-pay

## 2023-06-23 ENCOUNTER — Other Ambulatory Visit

## 2023-06-27 ENCOUNTER — Inpatient Hospital Stay

## 2023-06-28 DIAGNOSIS — G2119 Other drug induced secondary parkinsonism: Secondary | ICD-10-CM | POA: Diagnosis not present

## 2023-06-28 DIAGNOSIS — Z09 Encounter for follow-up examination after completed treatment for conditions other than malignant neoplasm: Secondary | ICD-10-CM | POA: Diagnosis not present

## 2023-06-28 DIAGNOSIS — G4733 Obstructive sleep apnea (adult) (pediatric): Secondary | ICD-10-CM | POA: Diagnosis not present

## 2023-06-28 DIAGNOSIS — J4540 Moderate persistent asthma, uncomplicated: Secondary | ICD-10-CM | POA: Diagnosis not present

## 2023-06-28 DIAGNOSIS — Z6834 Body mass index (BMI) 34.0-34.9, adult: Secondary | ICD-10-CM | POA: Diagnosis not present

## 2023-06-28 DIAGNOSIS — C9200 Acute myeloblastic leukemia, not having achieved remission: Secondary | ICD-10-CM | POA: Diagnosis not present

## 2023-06-28 DIAGNOSIS — E1165 Type 2 diabetes mellitus with hyperglycemia: Secondary | ICD-10-CM | POA: Diagnosis not present

## 2023-06-28 DIAGNOSIS — F209 Schizophrenia, unspecified: Secondary | ICD-10-CM | POA: Diagnosis not present

## 2023-06-28 DIAGNOSIS — E538 Deficiency of other specified B group vitamins: Secondary | ICD-10-CM | POA: Diagnosis not present

## 2023-06-28 NOTE — Progress Notes (Signed)
 Pharmacist Chemotherapy Monitoring - Initial Assessment    Anticipated start date: 07/11/23   The following has been reviewed per standard work regarding the patient's treatment regimen: The patient's diagnosis, treatment plan and drug doses, and organ/hematologic function Lab orders and baseline tests specific to treatment regimen  The treatment plan start date, drug sequencing, and pre-medications Prior authorization status  Patient's documented medication list, including drug-drug interaction screen and prescriptions for anti-emetics and supportive care specific to the treatment regimen The drug concentrations, fluid compatibility, administration routes, and timing of the medications to be used The patient's access for treatment and lifetime cumulative dose history, if applicable  The patient's medication allergies and previous infusion related reactions, if applicable   AML on chemotherapy with Azacitadine and Venetoclax .  Plan 07/11/2023 cycle 5  azacitidine  75mg /m2 x 5 days  / ventoclax 400mg  daily. D1-7 if her ANC >=1, and platelet >=50,000. Chemotherapy education.  Valtrex for virus prophylaxis.     Patient on previous therapy x 3 cycles      Heather Haley, [facility], 06/28/2023  2:01 PM

## 2023-06-28 NOTE — Progress Notes (Unsigned)
 Pharmacist Chemotherapy Monitoring - Initial Assessment    Anticipated start date: ***   The following has been reviewed per standard work regarding the patient's treatment regimen: The patient's diagnosis, treatment plan and drug doses, and organ/hematologic function Lab orders and baseline tests specific to treatment regimen  The treatment plan start date, drug sequencing, and pre-medications Prior authorization status  Patient's documented medication list, including drug-drug interaction screen and prescriptions for anti-emetics and supportive care specific to the treatment regimen The drug concentrations, fluid compatibility, administration routes, and timing of the medications to be used The patient's access for treatment and lifetime cumulative dose history, if applicable  The patient's medication allergies and previous infusion related reactions, if applicable   Changes made to treatment plan:  {changes made:25719}  Follow up needed:  {RX follow up:25721}   Heather Haley, RPH, 06/28/2023  1:58 PM

## 2023-06-30 ENCOUNTER — Encounter: Payer: Self-pay | Admitting: Oncology

## 2023-07-01 ENCOUNTER — Other Ambulatory Visit: Payer: Self-pay | Admitting: Oncology

## 2023-07-05 DIAGNOSIS — Z0000 Encounter for general adult medical examination without abnormal findings: Secondary | ICD-10-CM | POA: Diagnosis not present

## 2023-07-05 DIAGNOSIS — E1165 Type 2 diabetes mellitus with hyperglycemia: Secondary | ICD-10-CM | POA: Diagnosis not present

## 2023-07-05 DIAGNOSIS — J4530 Mild persistent asthma, uncomplicated: Secondary | ICD-10-CM | POA: Diagnosis not present

## 2023-07-05 DIAGNOSIS — M1712 Unilateral primary osteoarthritis, left knee: Secondary | ICD-10-CM | POA: Diagnosis not present

## 2023-07-05 DIAGNOSIS — Z6837 Body mass index (BMI) 37.0-37.9, adult: Secondary | ICD-10-CM | POA: Diagnosis not present

## 2023-07-05 DIAGNOSIS — E538 Deficiency of other specified B group vitamins: Secondary | ICD-10-CM | POA: Diagnosis not present

## 2023-07-07 ENCOUNTER — Encounter: Payer: Self-pay | Admitting: Oncology

## 2023-07-07 DIAGNOSIS — R600 Localized edema: Secondary | ICD-10-CM | POA: Diagnosis not present

## 2023-07-07 DIAGNOSIS — C9200 Acute myeloblastic leukemia, not having achieved remission: Secondary | ICD-10-CM | POA: Diagnosis not present

## 2023-07-07 DIAGNOSIS — G479 Sleep disorder, unspecified: Secondary | ICD-10-CM | POA: Diagnosis not present

## 2023-07-07 DIAGNOSIS — C9201 Acute myeloblastic leukemia, in remission: Secondary | ICD-10-CM | POA: Diagnosis not present

## 2023-07-07 DIAGNOSIS — M549 Dorsalgia, unspecified: Secondary | ICD-10-CM | POA: Diagnosis not present

## 2023-07-07 DIAGNOSIS — I2510 Atherosclerotic heart disease of native coronary artery without angina pectoris: Secondary | ICD-10-CM | POA: Diagnosis not present

## 2023-07-07 DIAGNOSIS — G20A1 Parkinson's disease without dyskinesia, without mention of fluctuations: Secondary | ICD-10-CM | POA: Diagnosis not present

## 2023-07-07 DIAGNOSIS — G8929 Other chronic pain: Secondary | ICD-10-CM | POA: Diagnosis not present

## 2023-07-07 DIAGNOSIS — F209 Schizophrenia, unspecified: Secondary | ICD-10-CM | POA: Diagnosis not present

## 2023-07-07 DIAGNOSIS — Z794 Long term (current) use of insulin: Secondary | ICD-10-CM | POA: Diagnosis not present

## 2023-07-11 ENCOUNTER — Other Ambulatory Visit

## 2023-07-11 ENCOUNTER — Ambulatory Visit

## 2023-07-11 ENCOUNTER — Ambulatory Visit: Admitting: Oncology

## 2023-07-12 ENCOUNTER — Ambulatory Visit

## 2023-07-13 ENCOUNTER — Ambulatory Visit

## 2023-07-14 ENCOUNTER — Ambulatory Visit

## 2023-07-14 DIAGNOSIS — C9201 Acute myeloblastic leukemia, in remission: Secondary | ICD-10-CM | POA: Diagnosis not present

## 2023-07-14 DIAGNOSIS — C9200 Acute myeloblastic leukemia, not having achieved remission: Secondary | ICD-10-CM | POA: Diagnosis not present

## 2023-07-14 DIAGNOSIS — D696 Thrombocytopenia, unspecified: Secondary | ICD-10-CM | POA: Diagnosis not present

## 2023-07-14 DIAGNOSIS — G8929 Other chronic pain: Secondary | ICD-10-CM | POA: Diagnosis not present

## 2023-07-14 DIAGNOSIS — I2510 Atherosclerotic heart disease of native coronary artery without angina pectoris: Secondary | ICD-10-CM | POA: Diagnosis not present

## 2023-07-14 DIAGNOSIS — R739 Hyperglycemia, unspecified: Secondary | ICD-10-CM | POA: Diagnosis not present

## 2023-07-14 DIAGNOSIS — M549 Dorsalgia, unspecified: Secondary | ICD-10-CM | POA: Diagnosis not present

## 2023-07-14 DIAGNOSIS — Z5111 Encounter for antineoplastic chemotherapy: Secondary | ICD-10-CM | POA: Diagnosis not present

## 2023-07-14 DIAGNOSIS — F209 Schizophrenia, unspecified: Secondary | ICD-10-CM | POA: Diagnosis not present

## 2023-07-14 DIAGNOSIS — G20A1 Parkinson's disease without dyskinesia, without mention of fluctuations: Secondary | ICD-10-CM | POA: Diagnosis not present

## 2023-07-15 ENCOUNTER — Ambulatory Visit

## 2023-07-16 DIAGNOSIS — Z5111 Encounter for antineoplastic chemotherapy: Secondary | ICD-10-CM | POA: Diagnosis not present

## 2023-07-16 DIAGNOSIS — C9201 Acute myeloblastic leukemia, in remission: Secondary | ICD-10-CM | POA: Diagnosis not present

## 2023-07-17 DIAGNOSIS — Z5111 Encounter for antineoplastic chemotherapy: Secondary | ICD-10-CM | POA: Diagnosis not present

## 2023-07-17 DIAGNOSIS — C9201 Acute myeloblastic leukemia, in remission: Secondary | ICD-10-CM | POA: Diagnosis not present

## 2023-07-18 DIAGNOSIS — Z5111 Encounter for antineoplastic chemotherapy: Secondary | ICD-10-CM | POA: Diagnosis not present

## 2023-07-18 DIAGNOSIS — C9201 Acute myeloblastic leukemia, in remission: Secondary | ICD-10-CM | POA: Diagnosis not present

## 2023-07-19 DIAGNOSIS — Z5111 Encounter for antineoplastic chemotherapy: Secondary | ICD-10-CM | POA: Diagnosis not present

## 2023-07-19 DIAGNOSIS — C9201 Acute myeloblastic leukemia, in remission: Secondary | ICD-10-CM | POA: Diagnosis not present

## 2023-07-27 DIAGNOSIS — E1169 Type 2 diabetes mellitus with other specified complication: Secondary | ICD-10-CM | POA: Diagnosis not present

## 2023-07-27 DIAGNOSIS — I10 Essential (primary) hypertension: Secondary | ICD-10-CM | POA: Diagnosis not present

## 2023-07-27 DIAGNOSIS — E669 Obesity, unspecified: Secondary | ICD-10-CM | POA: Diagnosis not present

## 2023-07-27 DIAGNOSIS — E782 Mixed hyperlipidemia: Secondary | ICD-10-CM | POA: Diagnosis not present

## 2023-07-27 DIAGNOSIS — E1159 Type 2 diabetes mellitus with other circulatory complications: Secondary | ICD-10-CM | POA: Diagnosis not present

## 2023-07-27 NOTE — Progress Notes (Signed)
 Heather Haley is a 63 y.o. female seen in follow up. She was last seen on 04/11/2023. She was seen today by video telehealth.  AML. She continues to receive chemotherapy. Oncology management is at [facility].    Type 2 diabetes. She checks sugars 2-3 times daily. Over last 2 weeks, sugars have been in the range of 170 - 290 mg/dl. Last hb A1c was 5.9% on 04/11/2023. For diabetes, she is taking and tolerating metformin  XR 750 mg daily, Tresiba  U100 insulin  24 units qhs and Fiasp  insulin  8 / 16 / 8 units. She tolerates these medications. Her appetite is reduced. No recent weight loss and self reported weight is 220 lbs, up 9 lbs from last visit. She reports she eats main meal for the mid day meal. Denies N/V/abd pain.   Diabetes complications. Her diabetes is complicated by vascular disease (aortic atherosclerosis on imaging). No known retinopathy; she last had an eye exam on 07/21/2022.  No significant renal insufficiency and her last eGFR was 86 on 07/18/23.  Previously tried medications: Trulicity 03/2021 (cost prohibitive) Lantus  03/2021 (cost prohibitive) Ozempic  09/2021  (cost prohibitive)   Past Medical History:  Diagnosis Date  . Adenomatous colon polyp   . Allergy 2008  . AML (acute myeloblastic leukemia) (CMS/HHS-HCC) 01/28/2023  . Aortic atherosclerosis ()   . Arthritis 2019  . Asthma, unspecified asthma severity, unspecified whether complicated, unspecified whether persistent (HHS-HCC) 1996  . Benign essential tremor   . Depression   . Gastritis 10/03/2014  . GERD (gastroesophageal reflux disease)   . H. pylori infection 06/2013  . Hyperlipidemia   . Hypertension   . Hypothyroidism   . IBS (irritable bowel syndrome)   . Migraines   . OSA (obstructive sleep apnea) 11/16/2022  . Panic attacks   . Parkinsonism (CMS/HHS-HCC)   . Type 2 diabetes mellitus (CMS/HHS-HCC)     Outpatient Medications Marked as Taking for the 07/27/23 encounter (Telemedicine) with Solum, Anna Melissa, MD   Medication Sig Dispense Refill  . albuterol  90 mcg/actuation inhaler Inhale 2 inhalations into the lungs every 6 (six) hours as needed for Wheezing 1 each 5  . amitriptyline  (ELAVIL ) 50 MG tablet Take 1 tablet (50 mg total) by mouth at bedtime for 90 days 90 tablet 1  . amLODIPine  (NORVASC ) 5 MG tablet Take 5 mg by mouth once daily    . azaCITIDine  (VIDAZA ) 100 mg chemo injection Inject subcutaneously    . buprenorphine  (BUTRANS ) 20 mcg/hour Place 1 patch onto the skin every Friday    . carbidopa -levodopa  (SINEMET ) 25-100 mg tablet TAKE 1 TABLET THREE TIMES DAILY 270 tablet 3  . docusate (COLACE) 100 MG capsule Take 100 mg by mouth 2 (two) times daily    . hydrOXYzine  (ATARAX ) 25 MG tablet Take 25 mg by mouth every 8 (eight) hours as needed    . insulin  ASPART (FIASP  FLEXTOUCH U-100 INSULIN ) pre-filled pen (concentration 100 units/mL) Inject 8 units before breakfast, 14 units before lunch, and 8 units before dinner (Patient taking differently: Inject 8 units before breakfast, 16 units before lunch, and 8 units before dinner) 45 mL 3  . insulin  DEGLUDEC (TRESIBA  FLEXTOUCH U-100) pen injector (concentration 100 units/mL) Inject 14 units daily as directed. Max daily dose is 50 units. (Patient taking differently: Inject 24 units daily as directed. Max daily dose is 50 units.) 45 mL 1  . isosorbide  mononitrate (IMDUR ) 30 MG ER tablet Take 1 tablet (30 mg total) by mouth once daily 90 tablet 3  . levoFLOXacin (LEVAQUIN)  500 MG tablet Take 500 mg by mouth once daily    . levothyroxine  (SYNTHROID ) 50 MCG tablet TAKE 1 TABLET ON SATURDAYS AND SUNDAYS ON EMPTY STOMACH WIT GLASS OF WATER AT LEAST 30 TO 60 MINUTES BEFORE BREAKFAST 24 tablet 3  . levothyroxine  (SYNTHROID ) 75 MCG tablet TAKE 1 TABLET MONDAY THROUGH FRIDAY ON EMPTY STOMACH W/GLASS OF WATER AT LEAST 30-60 MINUTES BEFORE BREAKFAST 60 tablet 3  . magnesium  oxide (MAG-OX) 400 mg (241.3 mg magnesium ) tablet Take 1 tablet by mouth once daily    .  metFORMIN  (GLUCOPHAGE ) 500 MG tablet Take 750 mg by mouth at bedtime    . metoprolol  TARTrate (LOPRESSOR ) 25 MG tablet Take 25 mg by mouth 2 (two) times daily    . nitroGLYcerin  (NITROSTAT ) 0.4 MG SL tablet Place 1 tablet (0.4 mg total) under the tongue every 5 (five) minutes as needed for Chest pain May take up to 3 doses. 25 tablet 0  . omeprazole  (PRILOSEC) 40 MG DR capsule Take 1 capsule (40 mg total) by mouth as needed Take 30 minutes before breakfast on an empty stomach 90 capsule 3  . ondansetron  (ZOFRAN ) 4 MG tablet Take 1 tablet (4 mg total) by mouth every 8 (eight) hours as needed for Nausea 20 tablet 5  . oxyCODONE  (ROXICODONE ) 5 MG immediate release tablet Take 5 mg by mouth as needed    . pravastatin  (PRAVACHOL ) 40 MG tablet TAKE 1 TABLET EVERY NIGHT 90 tablet 3  . rOPINIRole  (REQUIP ) 0.5 MG immediate release tablet TAKE 1 TABLET THREE TIMES DAILY 270 tablet 3  . SUMAtriptan  (IMITREX ) 100 MG tablet Take 1 tablet (100 mg total) by mouth as directed for Migraine May take a second dose after 2 hours if needed. 9 tablet 5  . thiamine (VITAMIN B-1) 100 MG tablet Take 100 mg by mouth once daily    . TORsemide  (DEMADEX ) 20 MG tablet Take 20 mg by mouth once daily    . triamcinolone  0.1 % cream Apply topically 2 (two) times daily APPLY TO AFFECTED AREA    . valACYclovir (VALTREX) 500 MG tablet Take 500 mg by mouth once daily    . venetoclax  (VENCLEXTA ) 100 mg tablet Take 400 mg by mouth Every 14 out of 28 days       Exam Ht 165.1 cm (5' 5)   Wt 99.8 kg (220 lb)   BMI 36.61 kg/m   GEN: well developed female in NAD.  PSYC: alert and oriented, good insight.   Assessment 1. Type 2 diabetes mellitus with vascular disease (CMS/HHS-HCC)   2. Type 2 diabetes mellitus with obesity  (CMS/HHS-HCC)   3. Essential hypertension   4. Hyperlipidemia, mixed     Plan - Counseled about target sugars and target Hb A1c.  - Adjust metformin  to metformin  XR 1500 mg (TWO of the 750 mg tabs) daily.   - Adjust Tresiba  insulin  to 28 units qhs.  - Continue Fiasp  insulin  8 / 16 / 8 units before meals. Inject 10-15 minutes before eating. Reminded her NOT to take Fiasp  if not eating, as Fiasp  is only to be taken before meals.  - Check sugars 3x daily. Bring meter to each follow up appt. - Continue statin. - Continue regular eye exams. She is up to date on this.   - Anticipate follow up in 2 months, or sooner if needed.   -------------------  This video encounter was conducted with the patient's (or proxy's) verbal consent via secure, interactive audio and video telecommunications while  away from clinic/office/hospital.  The patient (or proxy) was instructed to have this encounter in a suitably private space and to only have persons present to whom they give permission to participate. In addition, patient identity was confirmed by use of name plus an additional identifier.  This visit was coded based on medical decision making (MDM).

## 2023-08-09 ENCOUNTER — Ambulatory Visit (INDEPENDENT_AMBULATORY_CARE_PROVIDER_SITE_OTHER): Admitting: Psychiatry

## 2023-08-09 ENCOUNTER — Encounter: Payer: Self-pay | Admitting: Psychiatry

## 2023-08-09 ENCOUNTER — Other Ambulatory Visit: Payer: Self-pay

## 2023-08-09 VITALS — BP 101/67 | HR 81 | Temp 97.9°F | Ht 65.0 in | Wt 227.0 lb

## 2023-08-09 DIAGNOSIS — G2119 Other drug induced secondary parkinsonism: Secondary | ICD-10-CM

## 2023-08-09 DIAGNOSIS — F411 Generalized anxiety disorder: Secondary | ICD-10-CM

## 2023-08-09 DIAGNOSIS — F4310 Post-traumatic stress disorder, unspecified: Secondary | ICD-10-CM

## 2023-08-09 DIAGNOSIS — F251 Schizoaffective disorder, depressive type: Secondary | ICD-10-CM | POA: Diagnosis not present

## 2023-08-09 DIAGNOSIS — G4710 Hypersomnia, unspecified: Secondary | ICD-10-CM

## 2023-08-09 NOTE — Progress Notes (Signed)
 [facility] MD OP Progress Note  08/09/2023 3:52 PM Heather Haley  MRN:  [id number]  Chief Complaint:  Chief Complaint  Patient presents with   Follow-up   Anxiety   Depression   Hallucinations   Medication Refill   Discussed the use of AI scribe software for clinical note transcription with the patient, who gave verbal consent to proceed.  History of Present Illness Heather Haley is a 63 year old Caucasian female lives in [place], has a history of PTSD, schizoaffective disorder, generalized anxiety disorder, drug-induced Parkinson's disease, insomnia, headaches, diabetes mellitus, chronic pain, acute myeloid leukemia currently in remission currently under the care of oncology for ongoing chemotherapy, was evaluated in office today for a follow-up appointment.  She is currently undergoing chemotherapy for acute myeloid leukemia, which is in remission. She experiences fatigue, with a recent shift from insomnia to excessive sleepiness, often needing to lie down after taking her medication. She is on azacitidine . She experiences short, intense pains that began last month, occurring once last month and three times this month, with the latest episode two days ago. She takes oxycodone  and uses buprenorphine  patches for pain management.  She inquires about taking hydroxyzine  twice a day, which she has been doing, and takes amitriptyline  50 mg at bedtime for anxiety. She also takes olanzapine  (Zyprexa ) for hallucinations .per review of oncology notes dated 06/09/2023 , per Ms.Letha, okay to take olanzapine  and no interaction with current chemotherapy noted.  She reports hearing voices in her ear last week but not in the past week.  She has a history of type 2 diabetes, with a recent change to an extended-release form of metformin , taken as two tablets at once. She also has high blood pressure and hyperlipidemia.  She discusses her social situation, mentioning that she has redone her will, leaving  her estate equally to her daughter Delon and her son.  Her son, Elspeth, helps with her medical bills. She has a supportive family, including her daughter Delon and son Elspeth, who assist with her care and finances.  She currently denies any suicidality, homicidality or perceptual disturbances.  Memory seem to be intact, did well on bedside memory test.  Denies any other concerns today.    Visit Diagnosis:    ICD-10-CM   1. Schizoaffective disorder, depressive type (HCC)  F25.1     2. PTSD (post-traumatic stress disorder)  F43.10     3. Generalized anxiety disorder  F41.1     4. Drug-induced Parkinson's disease (HCC)  G21.19    zyprexa     5. Hypersomnia  G47.10       Past Psychiatric History: I have reviewed past psychiatric history from progress note on 05/25/2017.  Past Medical History:  Past Medical History:  Diagnosis Date   Anxiety    Asthma    Chronic kidney disease    Chronic pain    previously saw Dr. Patterson in pain clinic, then saw pain specialist in [facility]   Depression    Diabetes mellitus (HCC)    Frequency of urination    GERD (gastroesophageal reflux disease)    Headache(784.0)    High cholesterol    History of kidney stones    Hypertension    IBS (irritable bowel syndrome)    Left ankle instability    Left knee DJD    Lumbar Degenerative Disc Disease of  10/11/2014   Neuromuscular disorder (HCC)    Osteoarthritis of hip (Right) 05/05/2015   Other enthesopathy of ankle and tarsus 12/15/2009  Qualifier: Diagnosis of  By: HARVEY MD, KARL     Parkinson's disease (HCC)    Peripheral sensory neuropathy (Bilateral) 11/19/2014   Postoperative nausea and vomiting    Schizophrenia (HCC)     Past Surgical History:  Procedure Laterality Date   ABDOMINAL HYSTERECTOMY     ANKLE SURGERY Left    x 2   APPENDECTOMY     CARDIAC CATHETERIZATION     COLONOSCOPY  2013   COLONOSCOPY WITH PROPOFOL  N/A 04/25/2017   Procedure: COLONOSCOPY WITH PROPOFOL ;   Surgeon: Viktoria Lamar DASEN, MD;  Location: [facility] ENDOSCOPY;  Service: Endoscopy;  Laterality: N/A;   COLONOSCOPY WITH PROPOFOL  N/A 05/25/2022   Procedure: COLONOSCOPY WITH PROPOFOL ;  Surgeon: Maryruth Ole DASEN, MD;  Location: [facility] ENDOSCOPY;  Service: Endoscopy;  Laterality: N/A;   CYSTOSCOPY/URETEROSCOPY/HOLMIUM LASER/STENT PLACEMENT Left 09/11/2019   Procedure: CYSTOSCOPY/URETEROSCOPY/HOLMIUM LASER/STENT PLACEMENT;  Surgeon: Twylla Glendia BROCKS, MD;  Location: [facility] ORS;  Service: Urology;  Laterality: Left;   ESOPHAGOGASTRODUODENOSCOPY (EGD) WITH PROPOFOL  N/A 10/03/2014   Procedure: ESOPHAGOGASTRODUODENOSCOPY (EGD) WITH PROPOFOL ;  Surgeon: Donnice Vaughn Manes, MD;  Location: [facility] ENDOSCOPY;  Service: Endoscopy;  Laterality: N/A;   ESOPHAGOGASTRODUODENOSCOPY (EGD) WITH PROPOFOL   04/25/2017   Procedure: ESOPHAGOGASTRODUODENOSCOPY (EGD) WITH PROPOFOL ;  Surgeon: Viktoria Lamar DASEN, MD;  Location: [facility] ENDOSCOPY;  Service: Endoscopy;;   ESOPHAGOGASTRODUODENOSCOPY (EGD) WITH PROPOFOL  N/A 05/25/2022   Procedure: ESOPHAGOGASTRODUODENOSCOPY (EGD) WITH PROPOFOL ;  Surgeon: Maryruth Ole DASEN, MD;  Location: [facility] ENDOSCOPY;  Service: Endoscopy;  Laterality: N/A;   IR BONE MARROW BIOPSY & ASPIRATION  01/03/2023   JOINT REPLACEMENT Left    knee   KNEE ARTHROSCOPY  1997   left knee   LEFT HEART CATH AND CORONARY ANGIOGRAPHY Left 11/10/2017   Procedure: LEFT HEART CATH AND CORONARY ANGIOGRAPHY;  Surgeon: Ammon Blunt, MD;  Location: [facility] INVASIVE CV LAB;  Service: Cardiovascular;  Laterality: Left;   LEFT HEART CATHETERIZATION WITH CORONARY ANGIOGRAM N/A 01/11/2013   Procedure: LEFT HEART CATHETERIZATION WITH CORONARY ANGIOGRAM;  Surgeon: Victory LELON Claudene DOUGLAS, MD;  Location: [facility] CATH LAB;  Service: Cardiovascular;  Laterality: N/A;   TOTAL KNEE ARTHROPLASTY  08/30/2011   Procedure: TOTAL KNEE ARTHROPLASTY;  Surgeon: Lamar DELENA Millman, MD;  Location: [facility] OR;  Service: Orthopedics;  Laterality: Left;     Family Psychiatric History: I have reviewed family psychiatric history from progress note on 05/25/2017.  Family History:  Family History  Problem Relation Age of Onset   Cancer Mother    Colon cancer Mother    Hypertension Father    Heart disease Father    Alcohol  abuse Father    Breast cancer Sister     Social History: I have reviewed social history from progress note on 05/25/2017. Social History   Socioeconomic History   Marital status: Divorced    Spouse name: Not on file   Number of children: 2   Years of education: 11   Highest education level: 11th grade  Occupational History    Employer: [employer]  Tobacco Use   Smoking status: Former    Current packs/day: 0.00    Average packs/day: 2.0 packs/day for 15.0 years (30.0 ttl pk-yrs)    Types: Cigarettes    Start date: 02/09/1980    Quit date: 02/09/1995    Years since quitting: 28.5   Smokeless tobacco: Never  Vaping Use   Vaping status: Never Used  Substance and Sexual Activity   Alcohol  use: No    Alcohol /week: 0.0 standard drinks of alcohol    Drug use:  No   Sexual activity: Not Currently  Other Topics Concern   Not on file  Social History Narrative   Patient lives at home alone. Patient works at [employer].   Caffeine daily- 2   Right handed.   Education- 11 th grade   Social Drivers of Corporate Investment Banker Strain: Low Risk  (07/05/2023)   Received from [facility]   Overall Financial Resource Strain (CARDIA)    Difficulty of Paying Living Expenses: Not hard at all  Food Insecurity: No Food Insecurity (07/05/2023)   Received from [facility]   Hunger Vital Sign    Within the past 12 months, you worried that your food would run out before you got the money to buy more.: Never true    Within the past 12 months, the food you bought just didn't last and you didn't have money to get more.: Never true  Transportation Needs: No Transportation Needs  (07/05/2023)   Received from [facility] - Transportation    In the past 12 months, has lack of transportation kept you from medical appointments or from getting medications?: No    Lack of Transportation (Non-Medical): No  Physical Activity: Inactive (01/27/2023)   Received from [facility]   Exercise Vital Sign    On average, how many minutes do you engage in exercise at this level?: 10 min    On average, how many days per week do you engage in moderate to strenuous exercise (like a brisk walk)?: 0 days  Stress: Stress Concern Present (01/27/2023)   Received from [facility] - Occupational Stress Questionnaire    Feeling of Stress : Rather much  Social Connections: Socially Isolated (03/06/2023)   Social Connection and Isolation Panel    Frequency of Communication with Friends and Family: More than three times a week    Frequency of Social Gatherings with Friends and Family: More than three times a week    Attends Religious Services: Never    Database Administrator or Organizations: No    Attends Banker Meetings: Never    Marital Status: Divorced    Allergies:  Allergies  Allergen Reactions   Codeine Hives   Cymbalta [Duloxetine Hcl] Other (See Comments)    Altered mental status, Alopecia, visual hallucinations, nightmares   Gabapentin Swelling   Nsaids Anaphylaxis    REACTION: palpitations, diaphoresis  Reaction: Diaphoresis / Sweating (intolerance); AND CHEST PAINS   Other Reaction(s): Diaphoresis / Sweating (intolerance)    Reaction: Diaphoresis / Sweating (intolerance); AND CHEST PAINS     AND CHEST PAINS    feel like I'm having a heart attack   Prolixin  Decanoate [Fluphenazine ]     Ineffective    Benztropine  Mesylate Other (See Comments)   Buprenorphine  Hcl Other (See Comments)    Unable to void   Morphine And Codeine Other (See Comments)    Unable to void   Other  Other (See Comments)    Anesthesia makes me sick   Risperidone  And Paliperidone Other (See Comments)    tremors   Wellbutrin  [Bupropion ]     Constipation, mood swings   Benztropine  Other (See Comments)    Hair fall out  Suicide thoughts   Carbidopa -Levodopa  Nausea Only   Lyrica [Pregabalin] Other (See Comments)    Alopecia, visual hallucinations, nightmares Altered mental status   Nortripytline Hcl [Nortriptyline ] Other (See Comments)  Hair loss and night mares    Penicillins Nausea And Vomiting and Other (See Comments)    REACTION: upset stomach Has patient had a PCN reaction causing immediate rash, facial/tongue/throat swelling, SOB or lightheadedness with hypotension: No Has patient had a PCN reaction causing severe rash involving mucus membranes or skin necrosis: No Has patient had a PCN reaction that required hospitalization: No Has patient had a PCN reaction occurring within the last 10 years: No If all of the above answers are NO, then may proceed with Cephalosporin use.   Tolmetin Other (See Comments) and Palpitations    REACTION: palpitations, diaphoresis    Metabolic Disorder Labs: Lab Results  Component Value Date   HGBA1C 7.0 (H) 03/06/2023   MPG 154.2 03/06/2023   MPG 131.24 03/06/2017   Lab Results  Component Value Date   PROLACTIN 4.5 (L) 06/13/2020   PROLACTIN 64.2 (H) 03/06/2017   Lab Results  Component Value Date   CHOL 146 08/22/2018   TRIG 172 (H) 08/22/2018   HDL 49 08/22/2018   CHOLHDL 3.0 08/22/2018   VLDL 34 08/22/2018   LDLCALC 63 08/22/2018   LDLCALC 59 03/06/2017   Lab Results  Component Value Date   TSH 0.798 03/07/2023   TSH 0.959 08/22/2018    Therapeutic Level Labs: No results found for: LITHIUM Lab Results  Component Value Date   VALPROATE 43 (L) 09/25/2022   No results found for: CBMZ  Current Medications: Current Outpatient Medications  Medication Sig Dispense Refill   ACCU-CHEK GUIDE test strip       amitriptyline  (ELAVIL ) 50 MG tablet TAKE  AT BEDTIME     azaCITIDine  (VIDAZA ) 100 MG SUSR Inject into the skin once.     B-D ULTRAFINE III SHORT PEN 31G X 8 MM MISC Inject into the skin.     buprenorphine  (BUTRANS ) 20 MCG/HR PTWK Place 1 patch onto the skin once a week.     carbidopa -levodopa  (SINEMET  IR) 25-100 MG tablet Take 1 tablet by mouth 3 (three) times daily.     hydrOXYzine  (ATARAX ) 50 MG tablet Take 0.5-1 tablets (25-50 mg total) by mouth 2 (two) times daily as needed. 60 tablet 4   insulin  aspart (FIASP  FLEXTOUCH) 100 UNIT/ML FlexTouch Pen Inject 10 Units into the skin daily in the afternoon. Takes 10 units at 10:00 am, 16 units at 4 pm, 10 units at 10 pm.     insulin  degludec (TRESIBA  FLEXTOUCH) 100 UNIT/ML FlexTouch Pen Inject 38 Units into the skin daily. At 10:00 pm     isosorbide  mononitrate (IMDUR ) 30 MG 24 hr tablet Take 30 mg by mouth daily.     levothyroxine  (SYNTHROID ) 50 MCG tablet Take 50 mcg by mouth See admin instructions. Saturdays and Sundays only. Takes 75 MCG tab Monday-Friday     levothyroxine  (SYNTHROID ) 75 MCG tablet Take 75 mcg by mouth See admin instructions. Monday-Friday. Takes 50 MCG tab on Saturdays and Sundays     magnesium  oxide (MAG-OX) 400 (240 Mg) MG tablet Take 800 mg by mouth daily.     metFORMIN  (GLUCOPHAGE ) 500 MG tablet Take 1,000 mg by mouth 2 (two) times daily with a meal.     metoprolol  tartrate (LOPRESSOR ) 25 MG tablet Take 25 mg by mouth 2 (two) times daily.     nitroGLYCERIN  (NITRODUR - DOSED IN MG/24 HR) 0.4 mg/hr patch Place 0.4 mg onto the skin as needed.     OLANZapine  (ZYPREXA ) 2.5 MG tablet Take 1 tablet (2.5 mg total) by mouth at bedtime.  90 tablet 0   omeprazole  (PRILOSEC) 40 MG capsule Take 40 mg by mouth daily. Take 1 capsule 30 minutes before breakfast as needed on an empty stomach     ondansetron  (ZOFRAN ) 8 MG tablet Take 8 mg by mouth in the morning, at noon, and at bedtime.     oxyCODONE  (OXY IR/ROXICODONE ) 5 MG immediate release  tablet Take 5-10 mg by mouth 2 (two) times daily as needed for moderate pain (pain score 4-6).     pravastatin  (PRAVACHOL ) 40 MG tablet Take 40 mg by mouth daily.     rOPINIRole  (REQUIP ) 0.5 MG tablet Take 0.5 mg by mouth 3 (three) times daily.     Semaglutide  3 MG TABS Take 3 mg by mouth daily.     senna (SENOKOT) 8.6 MG tablet Take 2 tablets by mouth 2 (two) times daily.     SUMAtriptan  (IMITREX ) 100 MG tablet Take 100 mg by mouth every 2 (two) hours as needed for migraine. May repeat in 2 hours if headache persists or recurs.     thiamine (VITAMIN B1) 100 MG tablet Take 100 mg by mouth daily.     torsemide  (DEMADEX ) 20 MG tablet Take 20 mg by mouth every other day.     valACYclovir (VALTREX) 500 MG tablet Take 500 mg by mouth daily.     venetoclax  (VENCLEXTA ) 100 MG tablet Take 400 mg by mouth daily. Tablets should be swallowed whole with a meal and a full glass of water.     No current facility-administered medications for this visit.     Musculoskeletal: Strength & Muscle Tone: within normal limits Gait & Station: walks with can Patient leans: N/A  Psychiatric Specialty Exam: Review of Systems  Psychiatric/Behavioral:  Positive for sleep disturbance.     Blood pressure 101/67, pulse 81, temperature 97.9 F (36.6 C), temperature source Temporal, height 5' 5 (1.651 m), weight 227 lb (103 kg).Body mass index is 37.77 kg/m.  General Appearance: Casual  Eye Contact:  Fair  Speech:  Clear and Coherent  Volume:  Normal  Mood:  Euthymic  Affect:  Appropriate  Thought Process:  Goal Directed and Descriptions of Associations: Intact  Orientation:  Full (Time, Place, and Person)  Thought Content: Logical   Suicidal Thoughts:  No  Homicidal Thoughts:  No  Memory:  Immediate;   Fair Recent;   Fair Remote;   Fair  Judgement:  Fair  Insight:  Fair  Psychomotor Activity:  Normal  Concentration:  Concentration: limited  and Attention Span: limited  Recall:  [employer] of Knowledge:  Fair  Language: Fair  Akathisia:  No  Handed:  Right  AIMS (if indicated): done  Assets:  Communication Skills Desire for Improvement Housing Social Support Transportation  ADL's:  Intact  Cognition: WNL  Sleep:  excessive on and off   Screenings: AIMS    Flowsheet Row Office Visit from 08/09/2023 in [facility] Office Visit from 09/08/2022 in [facility] Office Visit from 04/27/2022 in [facility] Office Visit from 03/16/2022 in [facility] Office Visit from 12/14/2021 in [facility]  AIMS Total Score 0 5 0 0 0   AUDIT    Flowsheet Row Admission (Discharged) from OP Visit from 08/30/2018 [facility]) [facility] Ad[facility] (Discharged) from [facility]  Alcohol  Use Disorder Identification Test Final Score (AUDIT) 0 0 0   GAD-7    Flowsheet Row Office Visit from 08/09/2023 in [facility] Office Visit from 09/08/2022 in [facility] Office Visit from 04/27/2022 in [facility] Office Visit from 03/16/2022 in [facility] Office Visit from 12/14/2021 in [facility]  Total GAD-7 Score 12 5 5 5 8    PHQ2-9    Flowsheet Row Office Visit from 08/09/2023 in [facility] Office Visit from 11/22/2022 in [facility] Office Visit from 09/08/2022 in [facility] Office Visit from 04/27/2022 in [facility] Office Visit from 03/16/2022 in [facility]  PHQ-2 Total Score 0 1 2 0 0  PHQ-9 Total Score -- -- 6 8 6    Flowsheet Row Office Visit from 08/09/2023 in [facility] Office Visit from 06/08/2023 in [facility] Video Visit from 05/20/2023 in [facility]  C-SSRS RISK CATEGORY No Risk No Risk No Risk     Assessment and Plan: Heather Haley is a 63 year old Caucasian female who has a history of schizoaffective disorder, PTSD, migraine headaches, AML was evaluated in office today.  Discussed assessment and plan as noted below.  Schizoaffective disorder-improving Generalized anxiety disorder-stable Posttraumatic stress disorder-stable Currently denies any auditory hallucinations or other perceptual disturbances.  The last time  she had AH was a week ago.  Current regimen along with olanzapine  has been beneficial.  Does report excessive sleepiness likely due to current comorbidities including acute myeloid leukemia in remission on chemotherapy as well as medications for pain management.  Currently anxiety symptoms are manageable.  Does have good support system. Continue Olanzapine  2.5 mg at bedtime.  Could hold if excessive sleepiness. Continue Amitriptyline  50 mg at bedtime. Continue Hydroxyzine  25 to 50 mg twice a day as needed for severe anxiety.  Could hold if significant sleepiness.  Hypersomnia-unstable Currently with excessive sleepiness.  Had sleep study previously which did not show sleep apnea per report. Advised to hold medications like Olanzapine  and Hydroxyzine  if excessive sleepiness. Consider referral for sleep study in the future if needed.  Follow-up Follow-up in clinic in 2 months or sooner if needed.  Collaboration of Care: Collaboration of Care: Other I have reviewed notes per Ms. Dawson-oncology, most recent  07/17/2023-acute myeloid leukemia in remission.  Patient encouraged to continue therapy.  Patient/Guardian was advised Release of Information must be obtained prior to any record release in order to collaborate their care with an outside provider. Patient/Guardian was advised if they have not already done so to contact the registration department to sign all necessary forms in order for us  to release information regarding their care.   Consent: Patient/Guardian gives verbal consent for treatment and assignment of benefits for services provided during this visit. Patient/Guardian expressed understanding and agreed to proceed.   This note was generated in part or whole with voice recognition software. Voice recognition is usually quite accurate but there are transcription errors that can and very often do occur. I apologize for any typographical errors that were not detected and corrected.    Maekayla Giorgio, MD 08/10/2023, 9:02 AM

## 2023-08-10 ENCOUNTER — Other Ambulatory Visit: Payer: Self-pay

## 2023-08-11 DIAGNOSIS — M549 Dorsalgia, unspecified: Secondary | ICD-10-CM | POA: Diagnosis not present

## 2023-08-11 DIAGNOSIS — I2510 Atherosclerotic heart disease of native coronary artery without angina pectoris: Secondary | ICD-10-CM | POA: Diagnosis not present

## 2023-08-11 DIAGNOSIS — G8929 Other chronic pain: Secondary | ICD-10-CM | POA: Diagnosis not present

## 2023-08-11 DIAGNOSIS — C9201 Acute myeloblastic leukemia, in remission: Secondary | ICD-10-CM | POA: Diagnosis not present

## 2023-08-11 DIAGNOSIS — C9200 Acute myeloblastic leukemia, not having achieved remission: Secondary | ICD-10-CM | POA: Diagnosis not present

## 2023-08-11 DIAGNOSIS — Z885 Allergy status to narcotic agent status: Secondary | ICD-10-CM | POA: Diagnosis not present

## 2023-08-11 DIAGNOSIS — G20A1 Parkinson's disease without dyskinesia, without mention of fluctuations: Secondary | ICD-10-CM | POA: Diagnosis not present

## 2023-08-11 DIAGNOSIS — R109 Unspecified abdominal pain: Secondary | ICD-10-CM | POA: Diagnosis not present

## 2023-08-11 DIAGNOSIS — Z794 Long term (current) use of insulin: Secondary | ICD-10-CM | POA: Diagnosis not present

## 2023-08-11 DIAGNOSIS — M79673 Pain in unspecified foot: Secondary | ICD-10-CM | POA: Diagnosis not present

## 2023-08-12 DIAGNOSIS — C9201 Acute myeloblastic leukemia, in remission: Secondary | ICD-10-CM | POA: Diagnosis not present

## 2023-08-13 DIAGNOSIS — C9201 Acute myeloblastic leukemia, in remission: Secondary | ICD-10-CM | POA: Diagnosis not present

## 2023-08-14 DIAGNOSIS — C9201 Acute myeloblastic leukemia, in remission: Secondary | ICD-10-CM | POA: Diagnosis not present

## 2023-08-15 DIAGNOSIS — Z5111 Encounter for antineoplastic chemotherapy: Secondary | ICD-10-CM | POA: Diagnosis not present

## 2023-08-15 DIAGNOSIS — C9201 Acute myeloblastic leukemia, in remission: Secondary | ICD-10-CM | POA: Diagnosis not present

## 2023-08-15 NOTE — Telephone Encounter (Signed)
 FYI

## 2023-08-31 DIAGNOSIS — C9200 Acute myeloblastic leukemia, not having achieved remission: Secondary | ICD-10-CM | POA: Diagnosis not present

## 2023-08-31 DIAGNOSIS — G2119 Other drug induced secondary parkinsonism: Secondary | ICD-10-CM | POA: Diagnosis not present

## 2023-08-31 DIAGNOSIS — G43719 Chronic migraine without aura, intractable, without status migrainosus: Secondary | ICD-10-CM | POA: Diagnosis not present

## 2023-08-31 DIAGNOSIS — R4189 Other symptoms and signs involving cognitive functions and awareness: Secondary | ICD-10-CM | POA: Diagnosis not present

## 2023-09-04 DIAGNOSIS — F251 Schizoaffective disorder, depressive type: Secondary | ICD-10-CM

## 2023-09-05 MED ORDER — OLANZAPINE 2.5 MG PO TABS: 2.5000 mg | ORAL_TABLET | Freq: Every day | ORAL | 1 refills | Status: AC

## 2023-09-08 ENCOUNTER — Encounter: Payer: Self-pay | Admitting: Oncology

## 2023-09-08 DIAGNOSIS — D696 Thrombocytopenia, unspecified: Secondary | ICD-10-CM | POA: Diagnosis not present

## 2023-09-08 DIAGNOSIS — G8929 Other chronic pain: Secondary | ICD-10-CM | POA: Diagnosis not present

## 2023-09-08 DIAGNOSIS — F209 Schizophrenia, unspecified: Secondary | ICD-10-CM | POA: Diagnosis not present

## 2023-09-08 DIAGNOSIS — Z79899 Other long term (current) drug therapy: Secondary | ICD-10-CM | POA: Diagnosis not present

## 2023-09-08 DIAGNOSIS — G20A1 Parkinson's disease without dyskinesia, without mention of fluctuations: Secondary | ICD-10-CM | POA: Diagnosis not present

## 2023-09-08 DIAGNOSIS — C9201 Acute myeloblastic leukemia, in remission: Secondary | ICD-10-CM | POA: Diagnosis not present

## 2023-09-08 DIAGNOSIS — R739 Hyperglycemia, unspecified: Secondary | ICD-10-CM | POA: Diagnosis not present

## 2023-09-08 DIAGNOSIS — Z5111 Encounter for antineoplastic chemotherapy: Secondary | ICD-10-CM | POA: Diagnosis not present

## 2023-09-08 DIAGNOSIS — M549 Dorsalgia, unspecified: Secondary | ICD-10-CM | POA: Diagnosis not present

## 2023-09-09 DIAGNOSIS — Z5111 Encounter for antineoplastic chemotherapy: Secondary | ICD-10-CM | POA: Diagnosis not present

## 2023-09-09 DIAGNOSIS — C9201 Acute myeloblastic leukemia, in remission: Secondary | ICD-10-CM | POA: Diagnosis not present

## 2023-09-10 DIAGNOSIS — C9201 Acute myeloblastic leukemia, in remission: Secondary | ICD-10-CM | POA: Diagnosis not present

## 2023-09-10 DIAGNOSIS — Z5111 Encounter for antineoplastic chemotherapy: Secondary | ICD-10-CM | POA: Diagnosis not present

## 2023-09-11 DIAGNOSIS — Z5111 Encounter for antineoplastic chemotherapy: Secondary | ICD-10-CM | POA: Diagnosis not present

## 2023-09-11 DIAGNOSIS — C9201 Acute myeloblastic leukemia, in remission: Secondary | ICD-10-CM | POA: Diagnosis not present

## 2023-09-12 DIAGNOSIS — Z5111 Encounter for antineoplastic chemotherapy: Secondary | ICD-10-CM | POA: Diagnosis not present

## 2023-09-12 DIAGNOSIS — C9201 Acute myeloblastic leukemia, in remission: Secondary | ICD-10-CM | POA: Diagnosis not present

## 2023-09-15 DIAGNOSIS — I10 Essential (primary) hypertension: Secondary | ICD-10-CM | POA: Diagnosis not present

## 2023-09-15 DIAGNOSIS — E119 Type 2 diabetes mellitus without complications: Secondary | ICD-10-CM | POA: Diagnosis not present

## 2023-09-15 DIAGNOSIS — E782 Mixed hyperlipidemia: Secondary | ICD-10-CM | POA: Diagnosis not present

## 2023-09-15 DIAGNOSIS — E669 Obesity, unspecified: Secondary | ICD-10-CM | POA: Diagnosis not present

## 2023-09-15 DIAGNOSIS — E1165 Type 2 diabetes mellitus with hyperglycemia: Secondary | ICD-10-CM | POA: Diagnosis not present

## 2023-09-15 DIAGNOSIS — E1169 Type 2 diabetes mellitus with other specified complication: Secondary | ICD-10-CM | POA: Diagnosis not present

## 2023-09-15 DIAGNOSIS — E1159 Type 2 diabetes mellitus with other circulatory complications: Secondary | ICD-10-CM | POA: Diagnosis not present

## 2023-09-22 DIAGNOSIS — M25572 Pain in left ankle and joints of left foot: Secondary | ICD-10-CM | POA: Diagnosis not present

## 2023-09-22 DIAGNOSIS — Z794 Long term (current) use of insulin: Secondary | ICD-10-CM | POA: Diagnosis not present

## 2023-09-22 DIAGNOSIS — G8929 Other chronic pain: Secondary | ICD-10-CM | POA: Diagnosis not present

## 2023-09-22 DIAGNOSIS — I10 Essential (primary) hypertension: Secondary | ICD-10-CM | POA: Diagnosis not present

## 2023-09-22 DIAGNOSIS — C9200 Acute myeloblastic leukemia, not having achieved remission: Secondary | ICD-10-CM | POA: Diagnosis not present

## 2023-09-22 DIAGNOSIS — Z79899 Other long term (current) drug therapy: Secondary | ICD-10-CM | POA: Diagnosis not present

## 2023-09-22 DIAGNOSIS — F209 Schizophrenia, unspecified: Secondary | ICD-10-CM | POA: Diagnosis not present

## 2023-09-22 DIAGNOSIS — E119 Type 2 diabetes mellitus without complications: Secondary | ICD-10-CM | POA: Diagnosis not present

## 2023-09-22 DIAGNOSIS — M1712 Unilateral primary osteoarthritis, left knee: Secondary | ICD-10-CM | POA: Diagnosis not present

## 2023-10-13 ENCOUNTER — Encounter: Payer: Self-pay | Admitting: Psychiatry

## 2023-10-13 ENCOUNTER — Telehealth: Admitting: Psychiatry

## 2023-10-13 DIAGNOSIS — F411 Generalized anxiety disorder: Secondary | ICD-10-CM

## 2023-10-13 DIAGNOSIS — F4310 Post-traumatic stress disorder, unspecified: Secondary | ICD-10-CM | POA: Diagnosis not present

## 2023-10-13 DIAGNOSIS — F251 Schizoaffective disorder, depressive type: Secondary | ICD-10-CM

## 2023-10-13 DIAGNOSIS — G2119 Other drug induced secondary parkinsonism: Secondary | ICD-10-CM

## 2023-10-13 NOTE — Progress Notes (Signed)
 Virtual Visit via Telephone Note  I connected with Heather Haley on 10/13/23 at  4:00 PM EDT by telephone and verified that I am speaking with the correct person using two identifiers.  Location Provider Location : [facility] Patient Location : Home  Participants: Patient , Provider   I discussed the limitations, risks, security and privacy concerns of performing an evaluation and management service by telephone and the availability of in person appointments. I also discussed with the patient that there may be a patient responsible charge related to this service. The patient expressed understanding and agreed to proceed.   I discussed the assessment and treatment plan with the patient. The patient was provided an opportunity to ask questions and all were answered. The patient agreed with the plan and demonstrated an understanding of the instructions.   The patient was advised to call back or seek an in-person evaluation if the symptoms worsen or if the condition fails to improve as anticipated.                                                                     BH MD OP Progress Note  10/13/2023 4:11 PM RICHEL MILLSPAUGH  MRN:  [id number]  Chief Complaint:  Chief Complaint  Patient presents with   Follow-up   Medication Refill   Depression   Anxiety   schizaffective disorder   Discussed the use of AI scribe software for clinical note transcription with the patient, who gave verbal consent to proceed.  History of Present Illness Heather Haley is a 63 year old Caucasian female, lives in [place], has a history of PTSD, schizoaffective disorder, generalized anxiety disorder, drug-induced Parkinson's disease, insomnia, headaches, diabetes mellitus, chronic pain, acute myeloid leukemia, currently in remission currently under the care of oncology for ongoing chemotherapy was evaluated by phone today.  Patient was unable to connect by video and hence a phone call was used for  reevaluation.  Collateral information was obtained from daughter Delon based on patient request.  According to daughter patient is currently doing fairly well with regards to her mood symptoms although she continues to be fatigued due to her ongoing chemotherapy treatment.  Patient reports she continues to experience ongoing anxiety, though she reports it as less severe than previously. She describes becoming 'antsy' quickly, especially before activities such as going to the store or before treatments. She continues to experience significant fatigue and tiredness. She describes sleeping frequently during the day and notes that she does not experience pain while sleeping.   She experiences intermittent auditory hallucinations and describes hearing static and a high-pitched squeal at times. She states these voices do not bother her and she is able to ignore them.  This is a chronic hallucinations.  Currently continues to be on the olanzapine  which she takes at bedtime and that helps.  She denies suicidal thoughts and thoughts of harming others. She reports no memory problems and states she manages her home and daily activities. She describes her appetite as increased, notes recent weight gain, and expresses a need to reduce her dietary intake.  She declined interest in speaking to a counselor or therapist at this time.  She appeared to be alert, oriented to person place time and situation.  3 word  memory immediate 3 out of 3, after 5 minutes 2 out of 3.  Attention and focus seem to be limited in session.  She appeared to be alert, oriented to person place time and situation.    Visit Diagnosis:    ICD-10-CM   1. Schizoaffective disorder, depressive type (HCC)  F25.1     2. PTSD (post-traumatic stress disorder)  F43.10     3. Generalized anxiety disorder  F41.1     4. Drug-induced Parkinson's disease (HCC)  G21.19    zyprexa       Past Psychiatric History: Reviewed past psychiatric  history from progress note on 05/25/2017.  Past Medical History:  Past Medical History:  Diagnosis Date   Anxiety    Asthma    Chronic kidney disease    Chronic pain    previously saw Dr. Patterson in pain clinic, then saw pain specialist in [facility]   Depression    Diabetes mellitus (HCC)    Frequency of urination    GERD (gastroesophageal reflux disease)    Headache(784.0)    High cholesterol    History of kidney stones    Hypertension    IBS (irritable bowel syndrome)    Left ankle instability    Left knee DJD    Lumbar Degenerative Disc Disease of  10/11/2014   Neuromuscular disorder (HCC)    Osteoarthritis of hip (Right) 05/05/2015   Other enthesopathy of ankle and tarsus 12/15/2009   Qualifier: Diagnosis of  By: HARVEY MD, KARL     Parkinson's disease (HCC)    Peripheral sensory neuropathy (Bilateral) 11/19/2014   Postoperative nausea and vomiting    Schizophrenia (HCC)     Past Surgical History:  Procedure Laterality Date   ABDOMINAL HYSTERECTOMY     ANKLE SURGERY Left    x 2   APPENDECTOMY     CARDIAC CATHETERIZATION     COLONOSCOPY  2013   COLONOSCOPY WITH PROPOFOL  N/A 04/25/2017   Procedure: COLONOSCOPY WITH PROPOFOL ;  Surgeon: Viktoria Lamar DASEN, MD;  Location: [facility] ENDOSCOPY;  Service: Endoscopy;  Laterality: N/A;   COLONOSCOPY WITH PROPOFOL  N/A 05/25/2022   Procedure: COLONOSCOPY WITH PROPOFOL ;  Surgeon: Maryruth Ole DASEN, MD;  Location: [facility] ENDOSCOPY;  Service: Endoscopy;  Laterality: N/A;   CYSTOSCOPY/URETEROSCOPY/HOLMIUM LASER/STENT PLACEMENT Left 09/11/2019   Procedure: CYSTOSCOPY/URETEROSCOPY/HOLMIUM LASER/STENT PLACEMENT;  Surgeon: Twylla Glendia BROCKS, MD;  Location: [facility] ORS;  Service: Urology;  Laterality: Left;   ESOPHAGOGASTRODUODENOSCOPY (EGD) WITH PROPOFOL  N/A 10/03/2014   Procedure: ESOPHAGOGASTRODUODENOSCOPY (EGD) WITH PROPOFOL ;  Surgeon: Donnice Vaughn Manes, MD;  Location[facility] ENDOSCOPY;  Service: Endoscopy;  Laterality: N/A;    ESOPHAGOGASTRODUODENOSCOPY (EGD) WITH PROPOFOL   04/25/2017   Procedure: ESOPHAGOGASTRODUODENOSCOPY (EGD) WITH PROPOFOL ;  Surgeon: Viktoria Lamar DASEN, MD;  Location: [facility] ENDOSCOPY;  Service: Endoscopy;;   ESOPHAGOGASTRODUODENOSCOPY (EGD) WITH PROPOFOL  N/A 05/25/2022   Procedure: ESOPHAGOGASTRODUODENOSCOPY (EGD) WITH PROPOFOL ;  Surgeon: Maryruth Ole DASEN, MD;  Location: [facility] ENDOSCOPY;  Service: Endoscopy;  Laterality: N/A;   IR BONE MARROW BIOPSY & ASPIRATION  01/03/2023   JOINT REPLACEMENT Left    knee   KNEE ARTHROSCOPY  1997   left knee   LEFT HEART CATH AND CORONARY ANGIOGRAPHY Left 11/10/2017   Procedure: LEFT HEART CATH AND CORONARY ANGIOGRAPHY;  Surgeon: Ammon Blunt, MD;  Location: [facility] INVASIVE CV LAB;  Service: Cardiovascular;  Laterality: Left;   LEFT HEART CATHETERIZATION WITH CORONARY ANGIOGRAM N/A 01/11/2013   Procedure: LEFT HEART CATHETERIZATION WITH CORONARY ANGIOGRAM;  Surgeon: Victory LELON Claudene DOUGLAS, MD;  Location: [facility] CATH LAB;  Service: Cardiovascular;  Laterality: N/A;   TOTAL KNEE ARTHROPLASTY  08/30/2011   Procedure: TOTAL KNEE ARTHROPLASTY;  Surgeon: Lamar DELENA Millman, MD;  Location: [facility] OR;  Service: Orthopedics;  Laterality: Left;    Family Psychiatric History: I have reviewed family psychiatric history from progress note on 05/25/2017.  Family History:  Family History  Problem Relation Age of Onset   Cancer Mother    Colon cancer Mother    Hypertension Father    Heart disease Father    Alcohol  abuse Father    Breast cancer Sister     Social History: I have reviewed social history from progress note on 05/25/2017. Social History   Socioeconomic History   Marital status: Divorced    Spouse name: Not on file   Number of children: 2   Years of education: 11   Highest education level: 11th grade  Occupational History    Employer: [employer]  Tobacco Use   Smoking status: Former    Current packs/day: 0.00    Average packs/day: 2.0 packs/day for  15.0 years (30.0 ttl pk-yrs)    Types: Cigarettes    Start date: 02/09/1980    Quit date: 02/09/1995    Years since quitting: 28.6   Smokeless tobacco: Never  Vaping Use   Vaping status: Never Used  Substance and Sexual Activity   Alcohol  use: No    Alcohol /week: 0.0 standard drinks of alcohol    Drug use: No   Sexual activity: Not Currently  Other Topics Concern   Not on file  Social History Narrative   Patient lives at home alone. Patient works at [employer].   Caffeine daily- 2   Right handed.   Education- 11 th grade   Social Drivers of Corporate investment banker Strain: Low Risk  (07/05/2023)   Received from [facility]   Overall Financial Resource Strain (CARDIA)    Difficulty of Paying Living Expenses: Not hard at all  Food Insecurity: No Food Insecurity (10/06/2023)   Received from [facility]   Hunger Vital Sign    Within the past 12 months, you worried that your food would run out before you got the money to buy more.: Never true    Within the past 12 months, the food you bought just didn't last and you didn't have money to get more.: Never true  Transportation Needs: No Transportation Needs (10/06/2023)   Received from [facility] - Transportation    Lack of Transportation (Medical): No    Lack of Transportation (Non-Medical): No  Physical Activity: Inactive (01/27/2023)   Received from [facility]   Exercise Vital Sign    On average, how many days per week do you engage in moderate to strenuous exercise (like a brisk walk)?: 0 days    On average, how many minutes do you engage in exercise at this level?: 10 min  Stress: Stress Concern Present (01/27/2023)   Received from [facility] of Occupational [facility] - Occupational Stress Questionnaire    Feeling of Stress : Rather much  Social Connections: Socially Isolated (03/06/2023)   Social Connection and Isolation Panel    Frequency of Communication with  Friends and Family: More than three times a week    Frequency of Social Gatherings with Friends and Family: More than three times a week    Attends Religious Services: Never    Database administrator or Organizations: No  Attends Banker Meetings: Never    Marital Status: Divorced    Allergies:  Allergies  Allergen Reactions   Codeine Hives   Cymbalta [Duloxetine Hcl] Other (See Comments)    Altered mental status, Alopecia, visual hallucinations, nightmares   Gabapentin Swelling   Nsaids Anaphylaxis    REACTION: palpitations, diaphoresis  Reaction: Diaphoresis / Sweating (intolerance); AND CHEST PAINS   Other Reaction(s): Diaphoresis / Sweating (intolerance)    Reaction: Diaphoresis / Sweating (intolerance); AND CHEST PAINS     AND CHEST PAINS    feel like I'm having a heart attack   Prolixin  Decanoate [Fluphenazine ]     Ineffective    Benztropine  Mesylate Other (See Comments)   Buprenorphine  Hcl Other (See Comments)    Unable to void   Morphine And Codeine Other (See Comments)    Unable to void   Other Other (See Comments)    Anesthesia makes me sick   Risperidone  And Paliperidone Other (See Comments)    tremors   Wellbutrin  [Bupropion ]     Constipation, mood swings   Benztropine  Other (See Comments)    Hair fall out  Suicide thoughts   Carbidopa -Levodopa  Nausea Only   Lyrica [Pregabalin] Other (See Comments)    Alopecia, visual hallucinations, nightmares Altered mental status   Nortripytline Hcl [Nortriptyline ] Other (See Comments)    Hair loss and night mares    Penicillins Nausea And Vomiting and Other (See Comments)    REACTION: upset stomach Has patient had a PCN reaction causing immediate rash, facial/tongue/throat swelling, SOB or lightheadedness with hypotension: No Has patient had a PCN reaction causing severe rash involving mucus membranes or skin necrosis: No Has patient had a PCN reaction that required hospitalization: No Has  patient had a PCN reaction occurring within the last 10 years: No If all of the above answers are NO, then may proceed with Cephalosporin use.   Tolmetin Other (See Comments) and Palpitations    REACTION: palpitations, diaphoresis    Metabolic Disorder Labs: Lab Results  Component Value Date   HGBA1C 7.0 (H) 03/06/2023   MPG 154.2 03/06/2023   MPG 131.24 03/06/2017   Lab Results  Component Value Date   PROLACTIN 4.5 (L) 06/13/2020   PROLACTIN 64.2 (H) 03/06/2017   Lab Results  Component Value Date   CHOL 146 08/22/2018   TRIG 172 (H) 08/22/2018   HDL 49 08/22/2018   CHOLHDL 3.0 08/22/2018   VLDL 34 08/22/2018   LDLCALC 63 08/22/2018   LDLCALC 59 03/06/2017   Lab Results  Component Value Date   TSH 0.798 03/07/2023   TSH 0.959 08/22/2018    Therapeutic Level Labs: No results found for: LITHIUM Lab Results  Component Value Date   VALPROATE 43 (L) 09/25/2022   No results found for: CBMZ  Current Medications: Current Outpatient Medications  Medication Sig Dispense Refill   ACCU-CHEK GUIDE test strip      amitriptyline  (ELAVIL ) 50 MG tablet TAKE  AT BEDTIME     azaCITIDine  (VIDAZA ) 100 MG SUSR Inject into the skin once.     B-D ULTRAFINE III SHORT PEN 31G X 8 MM MISC Inject into the skin.     buprenorphine  (BUTRANS ) 20 MCG/HR PTWK Place 1 patch onto the skin once a week.     carbidopa -levodopa  (SINEMET  IR) 25-100 MG tablet Take 1 tablet by mouth 3 (three) times daily.     hydrOXYzine  (ATARAX ) 50 MG tablet Take 0.5-1 tablets (25-50 mg total) by mouth 2 (two) times daily as  needed. 60 tablet 4   insulin  aspart (FIASP  FLEXTOUCH) 100 UNIT/ML FlexTouch Pen Inject 10 Units into the skin daily in the afternoon. Takes 10 units at 10:00 am, 16 units at 4 pm, 10 units at 10 pm.     insulin  degludec (TRESIBA  FLEXTOUCH) 100 UNIT/ML FlexTouch Pen Inject 38 Units into the skin daily. At 10:00 pm     isosorbide  mononitrate (IMDUR ) 30 MG 24 hr tablet Take 30 mg by mouth daily.      levothyroxine  (SYNTHROID ) 50 MCG tablet Take 50 mcg by mouth See admin instructions. Saturdays and Sundays only. Takes 75 MCG tab Monday-Friday     levothyroxine  (SYNTHROID ) 75 MCG tablet Take 75 mcg by mouth See admin instructions. Monday-Friday. Takes 50 MCG tab on Saturdays and Sundays     magnesium  oxide (MAG-OX) 400 (240 Mg) MG tablet Take 800 mg by mouth daily.     metFORMIN  (GLUCOPHAGE ) 500 MG tablet Take 1,000 mg by mouth 2 (two) times daily with a meal.     metoprolol  tartrate (LOPRESSOR ) 25 MG tablet Take 25 mg by mouth 2 (two) times daily.     nitroGLYCERIN  (NITRODUR - DOSED IN MG/24 HR) 0.4 mg/hr patch Place 0.4 mg onto the skin as needed.     OLANZapine  (ZYPREXA ) 2.5 MG tablet Take 1 tablet (2.5 mg total) by mouth at bedtime. 90 tablet 1   omeprazole  (PRILOSEC) 40 MG capsule Take 40 mg by mouth daily. Take 1 capsule 30 minutes before breakfast as needed on an empty stomach     ondansetron  (ZOFRAN ) 8 MG tablet Take 8 mg by mouth in the morning, at noon, and at bedtime.     oxyCODONE  (OXY IR/ROXICODONE ) 5 MG immediate release tablet Take 5-10 mg by mouth 2 (two) times daily as needed for moderate pain (pain score 4-6).     pravastatin  (PRAVACHOL ) 40 MG tablet Take 40 mg by mouth daily.     rOPINIRole  (REQUIP ) 0.5 MG tablet Take 0.5 mg by mouth 3 (three) times daily.     Semaglutide  3 MG TABS Take 3 mg by mouth daily.     senna (SENOKOT) 8.6 MG tablet Take 2 tablets by mouth 2 (two) times daily.     SUMAtriptan  (IMITREX ) 100 MG tablet Take 100 mg by mouth every 2 (two) hours as needed for migraine. May repeat in 2 hours if headache persists or recurs.     thiamine (VITAMIN B1) 100 MG tablet Take 100 mg by mouth daily.     torsemide  (DEMADEX ) 20 MG tablet Take 20 mg by mouth every other day.     valACYclovir (VALTREX) 500 MG tablet Take 500 mg by mouth daily.     venetoclax  (VENCLEXTA ) 100 MG tablet Take 400 mg by mouth daily. Tablets should be swallowed whole with a meal and a full  glass of water.     No current facility-administered medications for this visit.     Musculoskeletal: Strength & Muscle Tone: UTA Gait & Station: UTA Patient leans: N/A  Psychiatric Specialty Exam: Review of Systems  Psychiatric/Behavioral:  Positive for sleep disturbance. The patient is nervous/anxious.     There were no vitals taken for this visit.There is no height or weight on file to calculate BMI.  General Appearance: UTA  Eye Contact:  UTA  Speech:  Clear and Coherent  Volume:  Normal  Mood:  Anxious  Affect:  UTA  Thought Process:  Goal Directed and Descriptions of Associations: Intact  Orientation:  Full (Time, Place, and  Person)  Thought Content: Hallucinations: Auditory Chronic intermittent -AH - able to cope  Suicidal Thoughts:  No  Homicidal Thoughts:  No  Memory:  Immediate;   Fair Recent;   Fair Remote;   Fair  Judgement:  Fair  Insight:  Fair  Psychomotor Activity:  UTA  Concentration:  Concentration: Limited and Attention Span: limited  Recall:  [employer] of Knowledge: Fair  Language: Fair  Akathisia:  No  Handed:  Right  AIMS (if indicated): not done  Assets:  Manufacturing systems engineer Desire for Improvement Housing Social Support Transportation  ADL's:  Intact  Cognition: WNL  Sleep:  excessive at times   Screenings: AIMS    Flowsheet Row Office Visit from 08/09/2023 in [facility] Office Visit from 09/08/2022 in [facility] Office Visit from 04/27/2022 in [facility] Office Visit from 03/16/2022 in [facility] Office Visit from 12/14/2021 in [facility]  AIMS Total Score 0 5 0 0 0   AUDIT    Flowsheet Row Admission (Discharged) from OP Visit from 08/30/2018 [facility] ([facility] Admission (Discharged) from [facility]  Alcohol  Use Disorder Identification Test Final Score (AUDIT) 0 0 0   GAD-7    Flowsheet Row Office Visit from 08/09/2023 in [facility] Office Visit from 09/08/2022 in [facility] Office Visit from 04/27/2022 in [facility] Office Visit from 03/16/2022 in [facility] Office Visit from 12/14/2021 in [facility]  Total GAD-7 Score 12 5 5 5 8    PHQ2-9    Flowsheet Row Office Visit from 08/09/2023 in [facility] Office Visit from 11/22/2022 in [facility] Office Visit from 09/08/2022 in [facility] Office Visit from 04/27/2022 in [facility] Office Visit from 03/16/2022 in [facility]  PHQ-2 Total Score 0 1 2 0 0  PHQ-9 Total Score -- -- 6 8 6    Flowsheet Row Office Visit from 08/09/2023 in [facility] Office Visit from 06/08/2023 in [facility] Video Visit from 05/20/2023 in [facility]  C-SSRS RISK CATEGORY No Risk No Risk No Risk     Assessment and Plan: ARRYANA TOLLESON is a 63 year old Caucasian female who has a history of schizoaffective disorder, PTSD, migraine headaches,AML was evaluated by phone today.  Discussed assessment and plan as noted below.  Schizoaffective disorder-stable Generalized anxiety disorder-unstable Posttraumatic stress disorder-stable Currently does have anxiety mostly situational.  Declines referral to psychotherapist.   Would like to stay on the current medication regimen.  Worried about excessive sleepiness which likely multifactorial.  Agreeable to hold the olanzapine  if sleep is excessive. Continue olanzapine  2.5 mg at bedtime.  Could hold if excessive sleepiness. Discussed referral for sleep study, declines. Continue amitriptyline  50 mg at bedtime Continue hydroxyzine  25 to 50 mg twice a day as needed for severe anxiety   Hypersomnia-unstable Excessive sleepiness likely multifactorial including undergoing chemotherapy for cancer as well as polypharmacy. Discussed referral for sleep study, patient declines.  Collateral information was  obtained from daughter Delon as noted above.  Follow-up Follow-up in clinic in 3 months or sooner in person.   Consent: Patient/Guardian gives verbal consent for treatment and assignment of benefits for services provided during this visit. Patient/Guardian expressed understanding and agreed to proceed.    I have spent at least 20 minutes non face to face with patient today.  This note was generated in part or whole with voice recognition software. Voice recognition is usually quite accurate but there are transcription errors that can and very often do occur. I apologize for any typographical errors that were not detected and corrected.    Jeniel Slauson, MD 10/13/2023, 4:11 PM

## 2023-10-14 ENCOUNTER — Other Ambulatory Visit: Payer: Self-pay

## 2023-10-25 DIAGNOSIS — Z0000 Encounter for general adult medical examination without abnormal findings: Secondary | ICD-10-CM | POA: Diagnosis not present

## 2023-10-25 DIAGNOSIS — Z6837 Body mass index (BMI) 37.0-37.9, adult: Secondary | ICD-10-CM | POA: Diagnosis not present

## 2023-10-25 DIAGNOSIS — J4540 Moderate persistent asthma, uncomplicated: Secondary | ICD-10-CM | POA: Diagnosis not present

## 2023-10-25 DIAGNOSIS — E1165 Type 2 diabetes mellitus with hyperglycemia: Secondary | ICD-10-CM | POA: Diagnosis not present

## 2023-10-25 DIAGNOSIS — E538 Deficiency of other specified B group vitamins: Secondary | ICD-10-CM | POA: Diagnosis not present

## 2023-10-25 DIAGNOSIS — Z1331 Encounter for screening for depression: Secondary | ICD-10-CM | POA: Diagnosis not present

## 2023-10-25 DIAGNOSIS — L659 Nonscarring hair loss, unspecified: Secondary | ICD-10-CM | POA: Diagnosis not present

## 2023-10-25 DIAGNOSIS — I10 Essential (primary) hypertension: Secondary | ICD-10-CM | POA: Diagnosis not present

## 2023-10-25 DIAGNOSIS — Z794 Long term (current) use of insulin: Secondary | ICD-10-CM | POA: Diagnosis not present

## 2023-10-25 DIAGNOSIS — I700 Atherosclerosis of aorta: Secondary | ICD-10-CM | POA: Diagnosis not present

## 2023-10-25 DIAGNOSIS — R519 Headache, unspecified: Secondary | ICD-10-CM | POA: Diagnosis not present

## 2023-11-01 DIAGNOSIS — J4530 Mild persistent asthma, uncomplicated: Secondary | ICD-10-CM | POA: Diagnosis not present

## 2023-11-01 DIAGNOSIS — Z6841 Body Mass Index (BMI) 40.0 and over, adult: Secondary | ICD-10-CM | POA: Diagnosis not present

## 2023-11-01 DIAGNOSIS — E538 Deficiency of other specified B group vitamins: Secondary | ICD-10-CM | POA: Diagnosis not present

## 2023-11-01 DIAGNOSIS — M25572 Pain in left ankle and joints of left foot: Secondary | ICD-10-CM | POA: Diagnosis not present

## 2023-11-01 DIAGNOSIS — E1165 Type 2 diabetes mellitus with hyperglycemia: Secondary | ICD-10-CM | POA: Diagnosis not present

## 2023-11-01 DIAGNOSIS — C9200 Acute myeloblastic leukemia, not having achieved remission: Secondary | ICD-10-CM | POA: Diagnosis not present

## 2023-11-01 DIAGNOSIS — Z23 Encounter for immunization: Secondary | ICD-10-CM | POA: Diagnosis not present

## 2023-11-01 DIAGNOSIS — F209 Schizophrenia, unspecified: Secondary | ICD-10-CM | POA: Diagnosis not present

## 2023-11-09 DIAGNOSIS — I10 Essential (primary) hypertension: Secondary | ICD-10-CM | POA: Diagnosis not present

## 2023-11-09 DIAGNOSIS — E1159 Type 2 diabetes mellitus with other circulatory complications: Secondary | ICD-10-CM | POA: Diagnosis not present

## 2023-11-09 DIAGNOSIS — E782 Mixed hyperlipidemia: Secondary | ICD-10-CM | POA: Diagnosis not present

## 2023-11-09 DIAGNOSIS — E1165 Type 2 diabetes mellitus with hyperglycemia: Secondary | ICD-10-CM | POA: Diagnosis not present

## 2023-11-09 DIAGNOSIS — Z794 Long term (current) use of insulin: Secondary | ICD-10-CM | POA: Diagnosis not present

## 2023-11-17 NOTE — Telephone Encounter (Signed)
 Called patient to make aware of her next appointment she voiced understanding

## 2023-12-19 DIAGNOSIS — I10 Essential (primary) hypertension: Secondary | ICD-10-CM | POA: Diagnosis not present

## 2023-12-19 DIAGNOSIS — R0609 Other forms of dyspnea: Secondary | ICD-10-CM | POA: Diagnosis not present

## 2023-12-19 DIAGNOSIS — I700 Atherosclerosis of aorta: Secondary | ICD-10-CM | POA: Diagnosis not present

## 2023-12-19 DIAGNOSIS — R002 Palpitations: Secondary | ICD-10-CM | POA: Diagnosis not present

## 2023-12-19 DIAGNOSIS — E7800 Pure hypercholesterolemia, unspecified: Secondary | ICD-10-CM | POA: Diagnosis not present

## 2023-12-19 DIAGNOSIS — R000 Tachycardia, unspecified: Secondary | ICD-10-CM | POA: Diagnosis not present

## 2023-12-19 DIAGNOSIS — R0789 Other chest pain: Secondary | ICD-10-CM | POA: Diagnosis not present

## 2023-12-19 DIAGNOSIS — Z9889 Other specified postprocedural states: Secondary | ICD-10-CM | POA: Diagnosis not present

## 2023-12-19 DIAGNOSIS — I25118 Atherosclerotic heart disease of native coronary artery with other forms of angina pectoris: Secondary | ICD-10-CM | POA: Diagnosis not present

## 2023-12-20 DIAGNOSIS — C9200 Acute myeloblastic leukemia, not having achieved remission: Secondary | ICD-10-CM | POA: Diagnosis not present

## 2023-12-20 DIAGNOSIS — E1165 Type 2 diabetes mellitus with hyperglycemia: Secondary | ICD-10-CM | POA: Diagnosis not present

## 2023-12-20 DIAGNOSIS — E1159 Type 2 diabetes mellitus with other circulatory complications: Secondary | ICD-10-CM | POA: Diagnosis not present

## 2023-12-20 DIAGNOSIS — I10 Essential (primary) hypertension: Secondary | ICD-10-CM | POA: Diagnosis not present

## 2023-12-20 DIAGNOSIS — Z794 Long term (current) use of insulin: Secondary | ICD-10-CM | POA: Diagnosis not present

## 2023-12-20 DIAGNOSIS — E782 Mixed hyperlipidemia: Secondary | ICD-10-CM | POA: Diagnosis not present

## 2023-12-21 ENCOUNTER — Other Ambulatory Visit: Payer: Self-pay | Admitting: Psychiatry

## 2023-12-21 DIAGNOSIS — F411 Generalized anxiety disorder: Secondary | ICD-10-CM

## 2024-01-23 ENCOUNTER — Encounter: Payer: Self-pay | Admitting: Psychiatry

## 2024-01-23 ENCOUNTER — Other Ambulatory Visit: Payer: Self-pay

## 2024-01-23 ENCOUNTER — Ambulatory Visit: Admitting: Psychiatry

## 2024-01-23 VITALS — BP 110/70 | HR 87 | Temp 97.3°F | Ht 65.0 in | Wt 238.6 lb

## 2024-01-23 DIAGNOSIS — F411 Generalized anxiety disorder: Secondary | ICD-10-CM

## 2024-01-23 DIAGNOSIS — F5101 Primary insomnia: Secondary | ICD-10-CM

## 2024-01-23 DIAGNOSIS — F4310 Post-traumatic stress disorder, unspecified: Secondary | ICD-10-CM

## 2024-01-23 DIAGNOSIS — G2119 Other drug induced secondary parkinsonism: Secondary | ICD-10-CM

## 2024-01-23 DIAGNOSIS — F251 Schizoaffective disorder, depressive type: Secondary | ICD-10-CM

## 2024-01-23 MED ORDER — ZOLPIDEM TARTRATE 5 MG PO TABS: 5.0000 mg | ORAL_TABLET | Freq: Every evening | ORAL | 0 refills | Status: AC | PRN

## 2024-01-23 NOTE — Progress Notes (Unsigned)
 [facility] MD OP Progress Note  01/23/2024 12:34 PM Heather Haley  MRN:  [id number]  Chief Complaint:  Chief Complaint  Patient presents with   Follow-up   Medication Refill   Insomnia   Hallucinations   Discussed the use of AI scribe software for clinical note transcription with the patient, who gave verbal consent to proceed.  History of Present Illness Heather Haley is a 63 year old Caucasian female, lives in  [place] has a history of PTSD, schizoaffective disorder, GAD, drug-induced Parkinson's disease, insomnia, headaches, diabetes mellitus, chronic pain, acute myeloid leukemia currently in remission, currently under the care of oncology for ongoing chemotherapy was evaluated in office today for a follow-up appointment.  Ongoing difficulty with sleep continues to affect her, as she describes very little sleep at night. She states she went to bed at 2:00 AM and woke up at 6:00 AM, experiencing intermittent awakenings throughout the night. She denies sleeping during the day and notes persistent tiredness and feeling run down. She continues to take olanzapine  (Zyprexa ) at bedtime but reports it does not help with sleep. She also mentions not trying other medications specifically for sleep recently and is not currently taking Ambien .  She tried Ambien  previously which may have helped.  Intermittent auditory hallucinations persist, and she states she still hears voices at times, though these are less bothersome and fluctuate. She denies that the voices currently cause distress or problems. She denies experiencing any hallucinations other than the voices.  She denies any thoughts of harming herself or others.  She denies significant depression or anxiety at this time. She describes her memory as good .She expresses some frustration with cognitive tasks, including serial sevens and number recall.  She celebrated Thanksgiving with family.   Visit Diagnosis:    ICD-10-CM   1.  Schizoaffective disorder, depressive type (HCC)  F25.1     2. PTSD (post-traumatic stress disorder)  F43.10     3. Generalized anxiety disorder  F41.1     4. Primary insomnia  F51.01 zolpidem  (AMBIEN ) 5 MG tablet    5. Drug-induced Parkinson's disease  G21.19       Past Psychiatric History: I have reviewed past psychiatric history from progress note on 05/25/2017.  Past Medical History:  Past Medical History:  Diagnosis Date   Anxiety    Asthma    Chronic kidney disease    Chronic pain    previously saw Dr. Patterson in pain clinic, then saw pain specialist in [facility]   Depression    Diabetes mellitus (HCC)    Frequency of urination    GERD (gastroesophageal reflux disease)    Headache(784.0)    High cholesterol    History of kidney stones    Hypertension    IBS (irritable bowel syndrome)    Left ankle instability    Left knee DJD    Lumbar Degenerative Disc Disease of  10/11/2014   Neuromuscular disorder (HCC)    Osteoarthritis of hip (Right) 05/05/2015   Other enthesopathy of ankle and tarsus 12/15/2009   Qualifier: Diagnosis of  By: HARVEY MD, KARL     Parkinson's disease (HCC)    Peripheral sensory neuropathy (Bilateral) 11/19/2014   Postoperative nausea and vomiting    Schizophrenia (HCC)     Past Surgical History:  Procedure Laterality Date   ABDOMINAL HYSTERECTOMY     ANKLE SURGERY Left    x 2   APPENDECTOMY     CARDIAC CATHETERIZATION     COLONOSCOPY  2013  COLONOSCOPY WITH PROPOFOL  N/A 04/25/2017   Procedure: COLONOSCOPY WITH PROPOFOL ;  Surgeon: Viktoria Lamar DASEN, MD;  Location: [facility] ENDOSCOPY;  Service: Endoscopy;  Laterality: N/A;   COLONOSCOPY WITH PROPOFOL  N/A 05/25/2022   Procedure: COLONOSCOPY WITH PROPOFOL ;  Surgeon: Maryruth Ole DASEN, MD;  Location: [facility] ENDOSCOPY;  Service: Endoscopy;  Laterality: N/A;   CYSTOSCOPY/URETEROSCOPY/HOLMIUM LASER/STENT PLACEMENT Left 09/11/2019   Procedure: CYSTOSCOPY/URETEROSCOPY/HOLMIUM LASER/STENT PLACEMENT;   Surgeon: Twylla Glendia BROCKS, MD;  Location: [facility] ORS;  Service: Urology;  Laterality: Left;   ESOPHAGOGASTRODUODENOSCOPY (EGD) WITH PROPOFOL  N/A 10/03/2014   Procedure: ESOPHAGOGASTRODUODENOSCOPY (EGD) WITH PROPOFOL ;  Surgeon: Donnice Vaughn Manes, MD;  Location: [facility] ENDOSCOPY;  Service: Endoscopy;  Laterality: N/A;   ESOPHAGOGASTRODUODENOSCOPY (EGD) WITH PROPOFOL   04/25/2017   Procedure: ESOPHAGOGASTRODUODENOSCOPY (EGD) WITH PROPOFOL ;  Surgeon: Viktoria Lamar DASEN, MD;  Location: [facility] ENDOSCOPY;  Service: Endoscopy;;   ESOPHAGOGASTRODUODENOSCOPY (EGD) WITH PROPOFOL  N/A 05/25/2022   Procedure: ESOPHAGOGASTRODUODENOSCOPY (EGD) WITH PROPOFOL ;  Surgeon: Maryruth Ole DASEN, MD;  Location: [facility] ENDOSCOPY;  Service: Endoscopy;  Laterality: N/A;   IR BONE MARROW BIOPSY & ASPIRATION  01/03/2023   JOINT REPLACEMENT Left    knee   KNEE ARTHROSCOPY  1997   left knee   LEFT HEART CATH AND CORONARY ANGIOGRAPHY Left 11/10/2017   Procedure: LEFT HEART CATH AND CORONARY ANGIOGRAPHY;  Surgeon: Ammon Blunt, MD;  Location: [facility] INVASIVE CV LAB;  Service: Cardiovascular;  Laterality: Left;   LEFT HEART CATHETERIZATION WITH CORONARY ANGIOGRAM N/A 01/11/2013   Procedure: LEFT HEART CATHETERIZATION WITH CORONARY ANGIOGRAM;  Surgeon: Victory LELON Claudene DOUGLAS, MD;  Location: [facility] CATH LAB;  Service: Cardiovascular;  Laterality: N/A;   TOTAL KNEE ARTHROPLASTY  08/30/2011   Procedure: TOTAL KNEE ARTHROPLASTY;  Surgeon: Lamar DELENA Millman, MD;  Location: [facility] OR;  Service: Orthopedics;  Laterality: Left;    Family Psychiatric History: I have reviewed family psychiatric history from progress note on 05/25/2017.  Family History:  Family History  Problem Relation Age of Onset   Cancer Mother    Colon cancer Mother    Hypertension Father    Heart disease Father    Alcohol  abuse Father    Breast cancer Sister     Social History: I have reviewed social history from progress note on 05/25/2017. Social History   Socioeconomic  History   Marital status: Divorced    Spouse name: Not on file   Number of children: 2   Years of education: 11   Highest education level: 11th grade  Occupational History    Employer: [employer]  Tobacco Use   Smoking status: Former    Current packs/day: 0.00    Average packs/day: 2.0 packs/day for 15.0 years (30.0 ttl pk-yrs)    Types: Cigarettes    Start date: 02/09/1980    Quit date: 02/09/1995    Years since quitting: 28.9   Smokeless tobacco: Never  Vaping Use   Vaping status: Never Used  Substance and Sexual Activity   Alcohol  use: No    Alcohol /week: 0.0 standard drinks of alcohol    Drug use: No   Sexual activity: Not Currently  Other Topics Concern   Not on file  Social History Narrative   Patient lives at home alone. Patient works at [employer].   Caffeine daily- 2   Right handed.   Education- 11 th grade   Social Drivers of Health   Tobacco Use: Medium Risk (01/23/2024)   Patient History    Smoking Tobacco Use: Former    Smokeless Tobacco Use: Never  Passive Exposure: Not on file  Financial Resource Strain: Low Risk  (11/01/2023)   [facility] from [facility]   Overall Financial Resource Strain (CARDIA)    Difficulty of Paying Living Expenses: Not very hard  Food Insecurity: No Food Insecurity (11/01/2023)   Received from [facility]   Epic    Within the past 12 months, you worried that your food would run out before you got the money to buy more.: Never true    Within the past 12 months, the food you bought just didn't last and you didn't have money to get more.: Never true  Transportation Needs: No Transportation Needs (11/01/2023)   Received from [facility] - Transportation    In the past 12 months, has lack of transportation kept you from medical appointments or from getting medications?: No    Lack of Transportation (Non-Medical): No  Physical Activity: Inactive (01/27/2023)    Received from [facility]   Exercise Vital Sign    On average, how many days per week do you engage in moderate to strenuous exercise (like a brisk walk)?: 0 days    On average, how many minutes do you engage in exercise at this level?: 10 min  Stress: Stress Concern Present (01/27/2023)   Received from [facility] - Occupational Stress Questionnaire    Feeling of Stress : Rather much  Social Connections: Socially Isolated (03/06/2023)   Social Connection and Isolation Panel    Frequency of Communication with Friends and Family: More than three times a week    Frequency of Social Gatherings with Friends and Family: More than three times a week    Attends Religious Services: Never    Database Administrator or Organizations: No    Attends Banker Meetings: Never    Marital Status: Divorced  Depression (PHQ2-9): Medium Risk (01/23/2024)   Depression (PHQ2-9)    PHQ-2 Score: 6  Alcohol  Screen: Not on file  Housing: Low Risk  (11/01/2023)   Received from [facility]   Epic    In the last 12 months, was there a time when you were not able to pay the mortgage or rent on time?: No    In the past 12 months, how many times have you moved where you were living?: 0    At any time in the past 12 months, were you homeless or living in a shelter (including now)?: No  Utilities: Not At Risk (11/01/2023)   Received from [facility]   Epic    In the past 12 months has the electric, gas, oil, or water company threatened to shut off services in your home?: No  Recent Concern: Utilities - High Risk (10/06/2023)   Received from [facility]   Utilities    Within the past 12 months, have you been unable to get utilities(heat, electricity) when it was really needed?: Yes  Health Literacy: Not on file    Allergies: Allergies[1]  Metabolic Disorder Labs: Lab Results  Component Value Date   HGBA1C 7.0  (H) 03/06/2023   MPG 154.2 03/06/2023   MPG 131.24 03/06/2017   Lab Results  Component Value Date   PROLACTIN 4.5 (L) 06/13/2020   PROLACTIN 64.2 (H) 03/06/2017   Lab Results  Component Value Date   CHOL 146 08/22/2018   TRIG 172 (H) 08/22/2018   HDL 49 08/22/2018  CHOLHDL 3.0 08/22/2018   VLDL 34 08/22/2018   LDLCALC 63 08/22/2018   LDLCALC 59 03/06/2017   Lab Results  Component Value Date   TSH 0.798 03/07/2023   TSH 0.959 08/22/2018    Therapeutic Level Labs: No results found for: LITHIUM Lab Results  Component Value Date   VALPROATE 43 (L) 09/25/2022   No results found for: CBMZ  Current Medications: Current Outpatient Medications  Medication Sig Dispense Refill   zolpidem  (AMBIEN ) 5 MG tablet Take 1 tablet (5 mg total) by mouth at bedtime as needed for sleep. 30 tablet 0   ACCU-CHEK GUIDE test strip      amitriptyline  (ELAVIL ) 50 MG tablet TAKE  AT BEDTIME     azaCITIDine  (VIDAZA ) 100 MG SUSR Inject into the skin once.     B-D ULTRAFINE III SHORT PEN 31G X 8 MM MISC Inject into the skin.     buprenorphine  (BUTRANS ) 20 MCG/HR PTWK Place 1 patch onto the skin once a week.     carbidopa -levodopa  (SINEMET  IR) 25-100 MG tablet Take 1 tablet by mouth 3 (three) times daily.     hydrOXYzine  (ATARAX ) 50 MG tablet TAKE 1/2 TO 1 TABLET TWICE DAILY AS NEEDED 60 tablet 11   insulin  aspart (FIASP  FLEXTOUCH) 100 UNIT/ML FlexTouch Pen Inject 10 Units into the skin daily in the afternoon. Takes 10 units at 10:00 am, 16 units at 4 pm, 10 units at 10 pm.     insulin  degludec (TRESIBA  FLEXTOUCH) 100 UNIT/ML FlexTouch Pen Inject 38 Units into the skin daily. At 10:00 pm     isosorbide  mononitrate (IMDUR ) 30 MG 24 hr tablet Take 30 mg by mouth daily.     levothyroxine  (SYNTHROID ) 50 MCG tablet Take 50 mcg by mouth See admin instructions. Saturdays and Sundays only. Takes 75 MCG tab Monday-Friday     levothyroxine  (SYNTHROID ) 75 MCG tablet Take 75 mcg by mouth See admin  instructions. Monday-Friday. Takes 50 MCG tab on Saturdays and Sundays     magnesium  oxide (MAG-OX) 400 (240 Mg) MG tablet Take 800 mg by mouth daily.     metFORMIN  (GLUCOPHAGE ) 500 MG tablet Take 1,000 mg by mouth 2 (two) times daily with a meal.     metoprolol  tartrate (LOPRESSOR ) 25 MG tablet Take 25 mg by mouth 2 (two) times daily.     nitroGLYCERIN  (NITRODUR - DOSED IN MG/24 HR) 0.4 mg/hr patch Place 0.4 mg onto the skin as needed.     OLANZapine  (ZYPREXA ) 2.5 MG tablet Take 1 tablet (2.5 mg total) by mouth at bedtime. 90 tablet 1   omeprazole  (PRILOSEC) 40 MG capsule Take 40 mg by mouth daily. Take 1 capsule 30 minutes before breakfast as needed on an empty stomach     ondansetron  (ZOFRAN ) 8 MG tablet Take 8 mg by mouth in the morning, at noon, and at bedtime.     oxyCODONE  (OXY IR/ROXICODONE ) 5 MG immediate release tablet Take 5-10 mg by mouth 2 (two) times daily as needed for moderate pain (pain score 4-6).     pravastatin  (PRAVACHOL ) 40 MG tablet Take 40 mg by mouth daily.     rOPINIRole  (REQUIP ) 0.5 MG tablet Take 0.5 mg by mouth 3 (three) times daily.     Semaglutide  3 MG TABS Take 3 mg by mouth daily.     senna (SENOKOT) 8.6 MG tablet Take 2 tablets by mouth 2 (two) times daily.     SUMAtriptan  (IMITREX ) 100 MG tablet Take 100 mg by mouth every 2 (  two) hours as needed for migraine. May repeat in 2 hours if headache persists or recurs.     thiamine (VITAMIN B1) 100 MG tablet Take 100 mg by mouth daily.     torsemide  (DEMADEX ) 20 MG tablet Take 20 mg by mouth every other day.     valACYclovir (VALTREX) 500 MG tablet Take 500 mg by mouth daily.     venetoclax  (VENCLEXTA ) 100 MG tablet Take 400 mg by mouth daily. Tablets should be swallowed whole with a meal and a full glass of water.     No current facility-administered medications for this visit.     Musculoskeletal: Strength & Muscle Tone: within normal limits Gait & Station: uses a cane Patient leans: N/A  Psychiatric  Specialty Exam: Review of Systems  Psychiatric/Behavioral:  Positive for hallucinations and sleep disturbance.     Blood pressure 110/70, pulse 87, temperature (!) 97.3 F (36.3 C), temperature source Temporal, height 5' 5 (1.651 m), weight 238 lb 9.6 oz (108.2 kg).Body mass index is 39.71 kg/m.  General Appearance: Casual  Eye Contact:  Fair  Speech:  Clear and Coherent  Volume:  Normal  Mood:  Euthymic  Affect:  Appropriate  Thought Process:  Goal Directed and Descriptions of Associations: Intact  Orientation:  Full (Time, Place, and Person)  Thought Content: Hallucinations: Auditory chronic , coping ok  Suicidal Thoughts:  No  Homicidal Thoughts:  No  Memory:  Immediate;   Fair Recent;   Fair Remote;   Fair  Judgement:  Fair  Insight:  Fair  Psychomotor Activity:  Normal  Concentration:  Concentration: limited and Attention Span: limited  Recall:  [employer] of Knowledge: Fair  Language: Fair  Akathisia:  No  Handed:  Right  AIMS (if indicated): done  Assets:  Communication Skills Desire for Improvement Housing Transportation  ADL's:  Intact  Cognition: WNL  Sleep:  poor   Screenings: Geneticist, Molecular Office Visit from 01/23/2024 in [facility] Office Visit from 08/09/2023 in [facility] Office Visit from 09/08/2022 in [facility] Office Visit from 04/27/2022 in [facility] Office Visit from 03/16/2022 in [facility]  AIMS Total Score 0 0 5 0 0   AUDIT    Flowsheet Row Admission (Discharged) from OP Visit from 08/30/2018 [facility] ([facility]) [facility] Ad[facility] (Discharged) from [facility]2017 [facility]  Alcohol  Use Disorder Identification  Test Final Score (AUDIT) 0 0 0   GAD-7    Flowsheet Row Office Visit from 01/23/2024 in [facility] Office Visit from 08/09/2023 in [facility] Office Visit from 09/08/2022 in [facility] Office Visit from 04/27/2022 in [facility] Office Visit from 03/16/2022 in [facility]  Total GAD-7 Score 5 12 5 5 5    PHQ2-9    Flowsheet Row Office Visit from 01/23/2024 in [facility] Office Visit from 08/09/2023 in [facility] Office Visit from 11/22/2022 in [facility] [place] Office Visit from 09/08/2022 in [facility] Office Visit from 04/27/2022 in [facility]  PHQ-2 Total  Score 2 0 1 2 0  PHQ-9 Total Score 6 -- -- 6 8   Flowsheet Row Office Visit from 01/23/2024 in [facility] Video Visit from 10/13/2023 in [facility] Office Visit from 08/09/2023 in [facility]  C-SSRS RISK CATEGORY No Risk No Risk No Risk     Assessment and Plan: Gurpreet D Cizek is a 63 year old Caucasian female who presented for a follow-up appointment, discussed assessment and plan as noted below.  1. Schizoaffective disorder, depressive type (HCC)-stable Damien denies any significant mood symptoms or auditory hallucinations or other perceptual disturbances. Continue Olanzapine  2.5 mg at bedtime.  2. PTSD (post-traumatic stress disorder)-stable Denies any significant trauma related symptoms Continue Amitriptyline  50 mg at bedtime Continue Hydroxyzine  25-50 mg twice a day as needed  3. Generalized  anxiety disorder-stable Anxiety is currently managed on the current medication regimen. Continue Amitriptyline  and Hydroxyzine  as prescribed  4. Primary insomnia-unstable Currently reports sleep problems. Continue sleep hygiene techniques Start Zolpidem  5 mg at bedtime as needed Reviewed [place] PMP AWARxE  5. Drug-induced Parkinson's disease-improving Currently under the care of neurologist.  Lower dosage Olanzapine  continued for management of psychosis/schizoaffective disorder.  Follow-up Follow-up in clinic in 1 month or sooner if needed.  Consent: Patient/Guardian gives verbal consent for treatment and assignment of benefits for services provided during this visit. Patient/Guardian expressed understanding and agreed to proceed.   This note was generated in part or whole with voice recognition software. Voice recognition is usually quite accurate but there are transcription errors that can and very often do occur. I apologize for any typographical errors that were not detected and corrected.    Dominie Benedick, MD 01/25/2024, 7:55 AM     [1]  Allergies Allergen Reactions   Codeine Hives   Cymbalta [Duloxetine Hcl] Other (See Comments)    Altered mental status, Alopecia, visual hallucinations, nightmares   Gabapentin Swelling   Nsaids Anaphylaxis    REACTION: palpitations, diaphoresis  Reaction: Diaphoresis / Sweating (intolerance); AND CHEST PAINS   Other Reaction(s): Diaphoresis / Sweating (intolerance)    Reaction: Diaphoresis / Sweating (intolerance); AND CHEST PAINS     AND CHEST PAINS    feel like I'm having a heart attack   Prolixin  Decanoate [Fluphenazine ]     Ineffective    Benztropine  Mesylate Other (See Comments)   Buprenorphine  Hcl Other (See Comments)    Unable to void   Morphine And Codeine Other (See Comments)    Unable to void   Other Other (See Comments)    Anesthesia makes me sick   Risperidone  And Paliperidone Other (See Comments)    tremors    Wellbutrin  [Bupropion ]     Constipation, mood swings   Benztropine  Other (See Comments)    Hair fall out  Suicide thoughts   Carbidopa -Levodopa  Nausea Only   Lyrica [Pregabalin] Other (See Comments)    Alopecia, visual hallucinations, nightmares Altered mental status   Nortripytline Hcl [Nortriptyline ] Other (See Comments)    Hair loss and night mares    Penicillins Nausea And Vomiting and Other (See Comments)    REACTION: upset stomach Has patient had a PCN reaction causing immediate rash, facial/tongue/throat swelling, SOB or lightheadedness with hypotension: No Has patient had a PCN reaction causing severe rash involving mucus membranes or skin necrosis: No Has patient had a PCN reaction that required hospitalization: No Has patient had a PCN reaction occurring within the last 10 years: No If all of  the above answers are NO, then may proceed with Cephalosporin use.   Tolmetin Other (See Comments) and Palpitations    REACTION: palpitations, diaphoresis

## 2024-01-23 NOTE — Patient Instructions (Signed)
 Zolpidem  Tablets What is this medication? ZOLPIDEM  (zole PI dem) treats insomnia. It helps you go to sleep faster and stay asleep through the night. It is often used for a short period of time. This medicine may be used for other purposes; ask your health care provider or pharmacist if you have questions. COMMON BRAND NAME(S): Ambien  What should I tell my care team before I take this medication? They need to know if you have any of these conditions: Depression Frequently drink alcohol  Liver disease Lung or breathing disease Myasthenia gravis Sleep apnea Substance use disorder Suicidal thoughts, plans, or attempt by you or a family member Unusual sleep behaviors or activities you do not remember An unusual or allergic reaction to zolpidem , other medications, foods, dyes, or preservatives Pregnant or trying to get pregnant Breastfeeding How should I use this medication? Take this medication by mouth with water. Take it as directed on the prescription label. It is better to take this medication on an empty stomach and only when you are ready for bed. Do not take your medication more often than directed. If you have been taking this medication for several weeks and suddenly stop taking it, you may get unpleasant withdrawal symptoms. Your care team may want to gradually reduce the dose. Do not stop taking this medication on your own. Always follow your care team's advice. A special MedGuide will be given to you by the pharmacist with each prescription and refill. Be sure to read this information carefully each time. Talk to your care team about the use of this medication in children. Special care may be needed. Overdosage: If you think you have taken too much of this medicine contact a poison control center or emergency room at once. NOTE: This medicine is only for you. Do not share this medicine with others. What if I miss a dose? This does not apply. This medication should only be taken  immediately before going to sleep. Do not take double or extra doses. What may interact with this medication? Alcohol  Antihistamines for allergy, cough, and cold Certain medications for anxiety or sleep Certain medications for depression, such as amitriptyline , fluoxetine , sertraline  Certain medications for fungal infections, such as ketoconazole and itraconazole Certain medications for seizures, such as phenobarbital, primidone Ciprofloxacin  Dietary supplements for sleep, such as valerian or kava kava General anesthetics, such as halothane, isoflurane, methoxyflurane, propofol  Local anesthetics, such as lidocaine , pramoxine, tetracaine Medications that relax muscles for surgery Opioid medications for pain Phenothiazines, such as chlorpromazine, mesoridazine, prochlorperazine, thioridazine Rifampin This list may not describe all possible interactions. Give your health care provider a list of all the medicines, herbs, non-prescription drugs, or dietary supplements you use. Also tell them if you smoke, drink alcohol , or use illegal drugs. Some items may interact with your medicine. What should I watch for while using this medication? Visit your care team for regular checks on your progress. Keep a regular sleep schedule by going to bed at about the same time each night. Avoid caffeine-containing drinks in the evening hours. When sleep medications are used every night for more than a few weeks, they may stop working. Talk to your care team if you still have trouble sleeping. You may do unusual sleep behaviors or activities you do not remember the day after taking this medication. Activities include driving, making or eating food, talking on the phone, sexual activity, or sleep walking. Stop taking this medication and call your care team right away if you find out you have done activities  like this. Plan to go to bed and stay in bed for a full night (7 to 8 hours) after you take this medication. You  may still be drowsy the morning after taking this medication. This medication may affect your coordination, reaction time, or judgment. Do not drive or operate machinery until you know how this medication affects you. Sit up or stand slowly to reduce the risk of dizzy or fainting spells. If you or your family notice any changes in your behavior, such as new or worsening depression, thoughts of harming yourself, anxiety, other unusual or disturbing thoughts, or memory loss, call your care team right away. After you stop taking this medication, you may have trouble falling asleep. This is called rebound insomnia. This problem usually goes away on its own after 1 or 2 nights. What side effects may I notice from receiving this medication? Side effects that you should report to your care team as soon as possible: Allergic reactions--skin rash, itching, hives, swelling of the face, lips, tongue, or throat Change in vision such as blurry vision, seeing halos around lights, vision loss CNS depression--slow or shallow breathing, shortness of breath, feeling faint, dizziness, confusion, trouble staying awake Mood and behavior changes--anxiety, nervousness, confusion, hallucinations, irritability, hostility, thoughts of suicide or self-harm, worsening mood, feelings of depression Unusual sleep behaviors or activities you do not remember such as driving, eating, or sexual activity Side effects that usually do not require medical attention (report these to your care team if they continue or are bothersome): Diarrhea Dizziness Drowsiness the day after use Headache This list may not describe all possible side effects. Call your doctor for medical advice about side effects. You may report side effects to FDA at 1-800-FDA-1088. Where should I keep my medication? Keep out of the reach of children and pets. This medication can be abused. Keep your medication in a safe place to protect it from theft. Do not share this  medication with anyone. Selling or giving away this medication is dangerous and against the law. Store at room temperature between 20 and 25 degrees C (68 and 77 degrees F). This medication may cause accidental overdose and death if taken by other adults, children, or pets. Mix any unused medication with a substance like cat litter or coffee grounds. Then throw the medication away in a sealed container like a sealed bag or a coffee can with a lid. Do not use the medication after the expiration date. NOTE: This sheet is a summary. It may not cover all possible information. If you have questions about this medicine, talk to your doctor, pharmacist, or health care provider.  2025 [employer] Standard (2023-06-06 00:00:00)

## 2024-01-24 ENCOUNTER — Other Ambulatory Visit: Payer: Self-pay

## 2024-03-07 ENCOUNTER — Encounter: Payer: Self-pay | Admitting: Oncology

## 2024-03-12 ENCOUNTER — Encounter: Payer: Self-pay | Admitting: Oncology

## 2024-03-19 ENCOUNTER — Telehealth: Admitting: Psychiatry
